# Patient Record
Sex: Male | Born: 1945 | Race: White | Hispanic: No | Marital: Married | Smoking: Never smoker
Health system: Southern US, Community
[De-identification: ages and names within clinical notes are randomized; demographics above are authoritative.]

## PROBLEM LIST (undated history)

## (undated) DIAGNOSIS — F329 Major depressive disorder, single episode, unspecified: Secondary | ICD-10-CM

## (undated) DIAGNOSIS — Z973 Presence of spectacles and contact lenses: Secondary | ICD-10-CM

## (undated) DIAGNOSIS — C801 Malignant (primary) neoplasm, unspecified: Secondary | ICD-10-CM

## (undated) DIAGNOSIS — K5900 Constipation, unspecified: Secondary | ICD-10-CM

## (undated) DIAGNOSIS — F32A Depression, unspecified: Secondary | ICD-10-CM

## (undated) DIAGNOSIS — R000 Tachycardia, unspecified: Secondary | ICD-10-CM

## (undated) DIAGNOSIS — C719 Malignant neoplasm of brain, unspecified: Secondary | ICD-10-CM

## (undated) DIAGNOSIS — R569 Unspecified convulsions: Secondary | ICD-10-CM

## (undated) DIAGNOSIS — R7989 Other specified abnormal findings of blood chemistry: Secondary | ICD-10-CM

## (undated) DIAGNOSIS — D649 Anemia, unspecified: Secondary | ICD-10-CM

## (undated) DIAGNOSIS — N189 Chronic kidney disease, unspecified: Secondary | ICD-10-CM

## (undated) DIAGNOSIS — I10 Essential (primary) hypertension: Secondary | ICD-10-CM

## (undated) DIAGNOSIS — M1990 Unspecified osteoarthritis, unspecified site: Secondary | ICD-10-CM

## (undated) DIAGNOSIS — F419 Anxiety disorder, unspecified: Secondary | ICD-10-CM

---
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
## 2013-06-13 ENCOUNTER — Emergency Department (HOSPITAL_BASED_OUTPATIENT_CLINIC_OR_DEPARTMENT_OTHER): Payer: Medicare HMO

## 2013-06-13 ENCOUNTER — Emergency Department (HOSPITAL_BASED_OUTPATIENT_CLINIC_OR_DEPARTMENT_OTHER)
Admission: EM | Admit: 2013-06-13 | Discharge: 2013-06-13 | Disposition: A | Discharge status: Home / Self Care | Payer: Medicare HMO | Attending: Emergency Medicine | Admitting: Emergency Medicine

## 2013-06-13 ENCOUNTER — Encounter (HOSPITAL_BASED_OUTPATIENT_CLINIC_OR_DEPARTMENT_OTHER): Payer: Self-pay | Admitting: Emergency Medicine

## 2013-06-13 DIAGNOSIS — Y929 Unspecified place or not applicable: Secondary | ICD-10-CM | POA: Insufficient documentation

## 2013-06-13 DIAGNOSIS — S91309A Unspecified open wound, unspecified foot, initial encounter: Secondary | ICD-10-CM | POA: Insufficient documentation

## 2013-06-13 DIAGNOSIS — Z23 Encounter for immunization: Secondary | ICD-10-CM | POA: Insufficient documentation

## 2013-06-13 DIAGNOSIS — W268XXA Contact with other sharp object(s), not elsewhere classified, initial encounter: Secondary | ICD-10-CM | POA: Insufficient documentation

## 2013-06-13 DIAGNOSIS — Y9389 Activity, other specified: Secondary | ICD-10-CM | POA: Insufficient documentation

## 2013-06-13 DIAGNOSIS — T148XXA Other injury of unspecified body region, initial encounter: Secondary | ICD-10-CM

## 2013-06-13 MED ORDER — TETANUS-DIPHTH-ACELL PERTUSSIS 5-2.5-18.5 LF-MCG/0.5 IM SUSP
0.5000 mL | Freq: Once | INTRAMUSCULAR | Status: AC
  Administered 2013-06-13: 0.5 mL via INTRAMUSCULAR
  Filled 2013-06-13: qty 0.5

## 2013-06-13 MED ORDER — CIPROFLOXACIN HCL 500 MG PO TABS
500.0000 mg | ORAL_TABLET | Freq: Once | ORAL | Status: AC
  Administered 2013-06-13: 500 mg via ORAL
  Filled 2013-06-13: qty 1

## 2013-06-13 MED ORDER — CIPROFLOXACIN HCL 500 MG PO TABS: 500.0000 mg | ORAL_TABLET | Freq: Two times a day (BID) | ORAL | Status: DC

## 2013-06-13 NOTE — Discharge Instructions (Signed)
Recheck with any signs of infection: Redness and swelling to the top of the foot, or red streaks to the bottom of the foot.  Puncture Wound A puncture wound happens when a sharp object pokes a hole in the skin. A puncture wound can cause an infection because germs can go under the skin during the injury. HOME CARE   Change your bandage (dressing) once a day, or as told by your doctor. If the bandage sticks, soak it in water.  Keep all doctor visits as told.  Only take medicine as told by your doctor.  Take your medicine (antibiotics) as told. Finish them even if you start to feel better. You may need a tetanus shot if:  You cannot remember when you had your last tetanus shot.  You have never had a tetanus shot. If you need a tetanus shot and you choose not to have one, you may get tetanus. Sickness from tetanus can be serious. You may need a rabies shot if an animal bite caused your wound. GET HELP RIGHT AWAY IF:   Your wound is red, puffy (swollen), or painful.  You see red lines on the skin near the wound.  You have a bad smell coming from the wound or bandage.  You have yellowish-white fluid (pus) coming from the wound.  Your medicine is not working.  You notice an object in the wound.  You have a fever.  You have severe pain.  You have trouble breathing.  You feel dizzy or pass out (faint).  You keep throwing up (vomiting).  You lose feeling (numbness) in your arm or leg, or you cannot move your arm or leg.  Your problems get worse. MAKE SURE YOU:   Understand these instructions.  Will watch your condition.  Will get help right away if you are not doing well or get worse. Document Released: 11/22/2007 Document Revised: 05/07/2011 Document Reviewed: 08/01/2010 [facility] Patient Information 2014 [place], [place].

## 2013-06-13 NOTE — ED Provider Notes (Signed)
CSN: [id number]     Arrival date & time 06/13/13  1504 History  This chart was scribed for Tanna Furry, MD by Elby Beck, ED Scribe. This patient was seen in room [facility]2/[facility] and the patient's care was started at 4:54 PM.   Chief Complaint  Patient presents with  . Puncture Wound    The history is provided by the patient. No language interpreter was used.    HPI Comments: Phillip [name] is a 68 y.o. male who presents to the Emergency Department complaining of 2 small puncture wounds to the plantar surface of his left foot. He states that he accidentally stepped on a board with nails in it while while wearing Crocs, and no socks. He reports having constant, mild pain to the areas of injury. He reports having a small amount of bleeding initially which he states has resolved. He states that he cleaned the puncture wounds with alcohol, but he states that he not tried any other treatments or medications. He states that he is otherwise healthy. He denies any history of DM. He states that he has no medication allergies.   History reviewed. No pertinent past medical history. History reviewed. No pertinent past surgical history. No family history on file. History  Substance Use Topics  . Smoking status: Never Smoker   . Smokeless tobacco: Never Used  . Alcohol Use: Not on file    Review of Systems  Constitutional: Negative for fever, chills, diaphoresis, appetite change and fatigue.  HENT: Negative for mouth sores, sore throat and trouble swallowing.   Eyes: Negative for visual disturbance.  Respiratory: Negative for cough, chest tightness, shortness of breath and wheezing.   Cardiovascular: Negative for chest pain.  Gastrointestinal: Negative for nausea, vomiting, abdominal pain, diarrhea and abdominal distention.  Endocrine: Negative for polydipsia, polyphagia and polyuria.  Genitourinary: Negative for dysuria, frequency and hematuria.  Musculoskeletal: Negative for gait problem.  Skin:  Positive for wound. Negative for color change, pallor and rash.  Neurological: Negative for dizziness, syncope, light-headedness and headaches.  Hematological: Does not bruise/bleed easily.  Psychiatric/Behavioral: Negative for behavioral problems and confusion.    Allergies  Review of patient's allergies indicates no known allergies.  Home Medications   Prior to Admission medications   Not on File   Triage Vitals: BP 171/80  Pulse 57  Temp(Src) 97.9 F (36.6 C) (Oral)  Resp 24  Ht 5\' 11"  (1.803 m)  Wt 250 lb (113.399 kg)  BMI 34.88 kg/m2  SpO2 97%  Physical Exam  Nursing note and vitals reviewed. Constitutional: He is oriented to person, place, and time. He appears well-developed and well-nourished. No distress.  HENT:  Head: Normocephalic.  Eyes: Conjunctivae are normal. Pupils are equal, round, and reactive to light. No scleral icterus.  Neck: Normal range of motion. Neck supple. No thyromegaly present.  Cardiovascular: Normal rate and regular rhythm.   Pulmonary/Chest: Effort normal. No respiratory distress.  Abdominal: Soft. He exhibits no distension.  Musculoskeletal: Normal range of motion.  Neurological: He is alert and oriented to person, place, and time.  Skin: Skin is warm and dry. No rash noted.  2 puncture wounds to the plantar surface of the left foot. One puncture is just proximal to the plantar aspect of the left great toe MCP. The second puncture is 3 cm, and is proximal to the left second toe MCP. No swelling to the dorsum of the foot. Non-tender to the dorsum.  Psychiatric: He has a normal mood and affect. His behavior is normal.  ED Course  Procedures (including critical care time)  DIAGNOSTIC STUDIES: Oxygen Saturation is 97% on RA, normal by my interpretation.    COORDINATION OF CARE: 4:58 PM- Discussed radiology findings. Pt's Tetanus vaccination was updated in the ED. Will start on first dose of Cipro in the ED and will discharge with a  prescription for the same. Pt advised of plan for treatment and pt agrees.  Medications  ciprofloxacin (CIPRO) tablet 500 mg (not administered)  Tdap (BOOSTRIX) injection 0.5 mL (0.5 mLs Intramuscular Given 06/13/13 1556)   Labs Review Labs Reviewed - No data to display  Imaging Review Dg Foot Complete Left  06/13/2013   CLINICAL DATA:  Nail injury to foot.  EXAM: LEFT FOOT - COMPLETE 3+ VIEW  COMPARISON:  None.  FINDINGS: Scattered degenerative changes. Tiny radiopaque foreign object measures maximally 5 mm adjacent the fifth MTP joint laterally. No acute fracture or dislocation. No soft tissue gas. Vascular calcifications.  IMPRESSION: Tiny radiopaque foreign object lateral to the fifth MTP joint.   Electronically Signed   By: Abigail Miyamoto M.D.   On: 06/13/2013 16:14     EKG Interpretation None      MDM   Final diagnoses:  Puncture wound    Several punctures. He has a metallic foreign body in the soft tissues laterally adjacent his fifth digit. This is not from today's injury. He is no sign of puncture anywhere this proximity. He was wearing "Crocks" shoes without socks. Plan will be antibiotic prophylaxis with pseudomonal coverage. Given prescription for Cipro. Recheck any sign of infection. No sign of fracture or soft tissue gas on x-rays.  I personally performed the services described in this documentation, which was scribed in my presence. The recorded information has been reviewed and is accurate.   Tanna Furry, MD 06/13/13 [phone]

## 2013-06-13 NOTE — ED Notes (Signed)
Pt reports stepped on nail with left foot- went through shoe into ball of foot- nail no longer in foot

## 2015-09-28 ENCOUNTER — Ambulatory Visit
Admission: RE | Admit: 2015-09-28 | Discharge: 2015-09-28 | Disposition: A | Discharge status: Home / Self Care | Payer: Medicare HMO | Source: Ambulatory Visit | Attending: Internal Medicine | Admitting: Internal Medicine

## 2015-09-28 ENCOUNTER — Other Ambulatory Visit: Payer: Self-pay | Admitting: Internal Medicine

## 2015-09-28 DIAGNOSIS — M5416 Radiculopathy, lumbar region: Secondary | ICD-10-CM

## 2015-10-26 ENCOUNTER — Other Ambulatory Visit: Payer: Self-pay | Admitting: Internal Medicine

## 2015-10-26 DIAGNOSIS — M545 Low back pain: Secondary | ICD-10-CM

## 2015-11-05 ENCOUNTER — Ambulatory Visit
Admission: RE | Admit: 2015-11-05 | Discharge: 2015-11-05 | Disposition: A | Discharge status: Home / Self Care | Payer: Medicare HMO | Source: Ambulatory Visit | Attending: Internal Medicine | Admitting: Internal Medicine

## 2015-11-05 DIAGNOSIS — M545 Low back pain: Secondary | ICD-10-CM

## 2015-11-09 ENCOUNTER — Encounter: Payer: Self-pay | Admitting: Radiation Oncology

## 2015-11-09 DIAGNOSIS — C7949 Secondary malignant neoplasm of other parts of nervous system: Secondary | ICD-10-CM | POA: Insufficient documentation

## 2015-11-10 ENCOUNTER — Telehealth: Payer: Self-pay | Admitting: Oncology

## 2015-11-10 ENCOUNTER — Encounter: Payer: Self-pay | Admitting: Oncology

## 2015-11-10 NOTE — Telephone Encounter (Signed)
Appointment scheduled with Dr. [name] for 9/21 at 2pm. Patent aware to arrive 15 minutes early. Demographics has been verfied. Letter mailed to the patient.

## 2015-11-14 ENCOUNTER — Ambulatory Visit
Admission: RE | Admit: 2015-11-14 | Discharge: 2015-11-14 | Disposition: A | Discharge status: Home / Self Care | Payer: Commercial Managed Care - HMO | Source: Ambulatory Visit | Attending: Radiation Oncology | Admitting: Radiation Oncology

## 2015-11-14 ENCOUNTER — Encounter: Payer: Self-pay | Admitting: Radiation Oncology

## 2015-11-14 ENCOUNTER — Other Ambulatory Visit (HOSPITAL_COMMUNITY): Payer: Self-pay | Admitting: Neurosurgery

## 2015-11-14 ENCOUNTER — Telehealth (HOSPITAL_COMMUNITY): Payer: Self-pay | Admitting: Radiology

## 2015-11-14 VITALS — BP 157/63 | Temp 98.5°F | Resp 18 | Ht 71.0 in | Wt 257.8 lb

## 2015-11-14 DIAGNOSIS — C7951 Secondary malignant neoplasm of bone: Secondary | ICD-10-CM

## 2015-11-14 DIAGNOSIS — Z510 Encounter for antineoplastic radiation therapy: Secondary | ICD-10-CM | POA: Insufficient documentation

## 2015-11-14 DIAGNOSIS — I10 Essential (primary) hypertension: Secondary | ICD-10-CM | POA: Diagnosis not present

## 2015-11-14 DIAGNOSIS — D492 Neoplasm of unspecified behavior of bone, soft tissue, and skin: Secondary | ICD-10-CM

## 2015-11-14 HISTORY — DX: Essential (primary) hypertension: I10

## 2015-11-14 LAB — CBC WITH DIFFERENTIAL/PLATELET
BASO%: 0.5 % (ref 0.0–2.0)
Basophils Absolute: 0.1 10*3/uL (ref 0.0–0.1)
EOS%: 5.5 % (ref 0.0–7.0)
Eosinophils Absolute: 0.6 10*3/uL — ABNORMAL HIGH (ref 0.0–0.5)
HCT: 43.7 % (ref 38.4–49.9)
HGB: 14.5 g/dL (ref 13.0–17.1)
LYMPH%: 28 % (ref 14.0–49.0)
MCH: 28.6 pg (ref 27.2–33.4)
MCHC: 33.2 g/dL (ref 32.0–36.0)
MCV: 86 fL (ref 79.3–98.0)
MONO#: 1.1 10*3/uL — ABNORMAL HIGH (ref 0.1–0.9)
MONO%: 10.4 % (ref 0.0–14.0)
NEUT#: 5.8 10*3/uL (ref 1.5–6.5)
NEUT%: 55.6 % (ref 39.0–75.0)
Platelets: 262 10*3/uL (ref 140–400)
RBC: 5.09 10*6/uL (ref 4.20–5.82)
RDW: 13.5 % (ref 11.0–14.6)
WBC: 10.5 10*3/uL — ABNORMAL HIGH (ref 4.0–10.3)
lymph#: 2.9 10*3/uL (ref 0.9–3.3)

## 2015-11-14 LAB — COMPREHENSIVE METABOLIC PANEL
ALT: 48 U/L (ref 0–55)
AST: 20 U/L (ref 5–34)
Albumin: 4.3 g/dL (ref 3.5–5.0)
Alkaline Phosphatase: 87 U/L (ref 40–150)
Anion Gap: 11 mEq/L (ref 3–11)
BUN: 15 mg/dL (ref 7.0–26.0)
CO2: 27 mEq/L (ref 22–29)
Calcium: 10.4 mg/dL (ref 8.4–10.4)
Chloride: 102 mEq/L (ref 98–109)
Creatinine: 0.9 mg/dL (ref 0.7–1.3)
EGFR: 87 mL/min/{1.73_m2} — ABNORMAL LOW (ref >90)
Glucose: 118 mg/dl (ref 70–140)
Potassium: 4.2 mEq/L (ref 3.5–5.1)
Sodium: 140 mEq/L (ref 136–145)
Total Bilirubin: 0.46 mg/dL (ref 0.20–1.20)
Total Protein: 8.2 g/dL (ref 6.4–8.3)

## 2015-11-14 NOTE — Progress Notes (Signed)
[facility]         [id number] ________________________________  Initial outpatient Consultation  Name: Phillip [name] MRN: [id number]  Date: 11/14/2015  DOB: [birth year]  REFERRING PHYSICIAN: [name], MD  DIAGNOSIS: 70 yo man with a 6 cm expansile mass of the right L4 vertebral body suspicious for malignancy    ICD-9-CM ICD-10-CM   1. 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 male who was referred from Dr. Brayton Layman to Dr. Vertell Limber for evaluation of right hip and buttock pain. He participated in PT with no relief. An MRI revealed a right sided L4 malignant bone lesion which is causing compression of the L4 nerve root, multilevel bulging discs, and degeneration. He notes the right hip and buttock pain with a pain range from 3-7. He notes the pain in his right hip is a 5/10. He is taking Hydrocodone and Tylenol. Patient notes he is taking laxatives as needed for constipation due to Hydrocodone. He denies weakness or numbness in extremities. The patient is ambulatory without assistance.  [name]  PREVIOUS RADIATION THERAPY: No  Past Medical History:  Diagnosis Date  . Hypertension   :  No past surgical history on file.:   Current Outpatient Prescriptions:  .  acetaminophen (TYLENOL) 650 MG CR tablet, Take 650 mg by mouth every 8 (eight) hours as needed for pain., Disp: , Rfl:  .  diclofenac (VOLTAREN) 75 MG EC tablet, , Disp: , Rfl:  .  famotidine (PEPCID) 20 MG tablet, Take 20 mg by mouth 2 (two) times daily., Disp: , Rfl:  .  losartan-hydrochlorothiazide (HYZAAR) 100-12.5 MG tablet, , Disp: , Rfl:  .  Multiple Vitamins-Minerals (MULTIVITAMIN WITH MINERALS) tablet, Take 1 tablet by mouth daily., Disp: , Rfl:  .  oxyCODONE (OXY IR/ROXICODONE) 5 MG immediate release tablet, , Disp: , Rfl: :  No Known Allergies:  No family history on file.:  Social History   Social History  . Marital status: Single    Spouse  name: N/A  . Number of children: N/A  . Years of education: N/A   Occupational History  . Not on file.   Social History Main Topics  . Smoking status: Never Smoker  . Smokeless tobacco: Never Used  . Alcohol use Not on file  . Drug use:     Types: Marijuana  . Sexual activity: Not on file   Other Topics Concern  . Not on file   Social History Narrative  . No narrative on file  :  REVIEW OF SYSTEMS:  A 15 point review of systems is documented in the electronic medical record. This was obtained by the nursing staff. However, I reviewed this with the patient to discuss relevant findings and make appropriate changes.  Pertinent items are noted in HPI.   PHYSICAL EXAM:  Blood pressure (!) 157/63, temperature 98.5 F (36.9 C), temperature source Oral, resp. rate 18, height '5\' 11"'$  (1.803 m), weight 257 lb 12.8 oz (116.9 kg), SpO2 100 %. General appearance: alert, cooperative and appears stated age Neck: no adenopathy Resp: clear to auscultation bilaterally Extremities: extremities normal, atraumatic, no cyanosis or edema Neurologic: Alert and oriented X 3, normal strength and tone. Normal symmetric reflexes. Normal coordination and gait   KPS = 90  100 - Normal; no complaints; no evidence of disease. 90   - Able to carry on normal activity; minor signs or symptoms of disease. 80   - Normal  activity with effort; some signs or symptoms of disease. 102   - Cares for self; unable to carry on normal activity or to do active work. 60   - Requires occasional assistance, but is able to care for most of his personal needs. 50   - Requires considerable assistance and frequent medical care. 75   - Disabled; requires special care and assistance. 44   - Severely disabled; hospital admission is indicated although death not imminent. 78   - Very sick; hospital admission necessary; active supportive treatment necessary. 10   - Moribund; fatal processes progressing rapidly. 0     -  Dead  Karnofsky DA, Abelmann WH, Craver LS and Burchenal JH [phone]) The use of the nitrogen mustards in the palliative treatment of carcinoma: with particular reference to bronchogenic carcinoma Cancer 1 634-56  LABORATORY DATA:  No results found for: WBC, HGB, HCT, MCV, PLT No results found for: NA, K, CL, CO2 No results found for: ALT, AST, GGT, ALKPHOS, BILITOT   RADIOGRAPHY: Mr Lumbar Spine Wo Contrast  Result Date: 11/05/2015 CLINICAL DATA:  Low back pain without sciatica. Weakness in the right foot starting 4 months ago. EXAM: MRI LUMBAR SPINE WITHOUT CONTRAST TECHNIQUE: Multiplanar, multisequence MR imaging of the lumbar spine was performed. No intravenous contrast was administered. COMPARISON:  None. FINDINGS: Segmentation:  Standard. Alignment:  Physiologic. Vertebrae: Large bone lesion in the L4 right posterior body, pedicle, lamina, transverse process, and articular processes. There is extraosseous extension in the right far-lateral space with possible psoas and intrinsic back muscle invasion. Infiltration in the right L4-5 more than L3-4 foramina with right L4 compression. Epidural infiltration along the right canal with advanced thecal sac mass effect. When accounting for T11 and L3, and L4 hemangiomas no other bone lesion is seen Conus medullaris: Extends to the T12 level and appears normal. Paraspinal and other soft tissues: Right far-lateral/ paraspinal tumor as above. Disc levels: Spinal canal stenosis is accentuated by short pedicles L2-L3: Mild disc bulging. Triangular narrowing of the thecal sac without suspected impingement L3-L4: Tumoral stenosis in the right canal as described above. Mild facet hypertrophy and disc bulging. Moderate canal stenosis. L4-L5: Advanced canal stenosis from epidural tumor. There is a background of disc bulging and congenitally narrow canal. The right L4 nerve is completely encased/ compressed by tumor. L5-S1:Narrowing and bulging with central protrusion.  Hyper trophic facet arthropathy. Moderate right foraminal stenosis. These results will be called to the ordering clinician or representative by the Radiologist Assistant, and communication documented in the PACS or zVision Dashboard. IMPRESSION: 1. 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 There is extraosseous epidural and right foraminal infiltration with advanced canal and foraminal stenosis. Metastasis or myeloma is favored. Recommend postcontrast imaging. 2. 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 Superimposed degenerative changes are mild for age. Electronically Signed   By: Monte Fantasia M.D.   On: 11/05/2015 20:02      IMPRESSION: 70 yo man with a 6 cm expansile mass of the right L4 vertebral body suspicious for malignancy  PLAN:Today, I talked to the patient and family about the findings and work-up thus far.  We discussed the natural history of spinal metastasis versus myeloma and other diseases and general treatment, highlighting the role of radiotherapy in the management.  Prior to making any further recommendations, the patient understands that he needs additional diagnostic work-up.  He meets with Dr. [name] later this week.  Today, I took the liberty of ordering:  1.  CBC with diff, C-Met 2.  CT Chest/Abd/Pelvis 3.  CT-guided biopsy of L4  I spent time face to face with the patient and more than 50% of that time was spent in counseling and/or coordination of care.    ------------------------------------------------   Tyler Pita, MD [facility] Director and Director of Stereotactic Radiosurgery Direct Dial: [phone]  Fax: [phone] [email or web address]  Skype  [email or web address]    This document serves as a record of services personally performed by Tyler Pita, MD. It was created on his behalf by Bethann Humble, a trained medical scribe. The creation of this record is based on the scribe's personal  observations and the provider's statements to them. This document has been checked and approved by the attending provider.

## 2015-11-14 NOTE — Addendum Note (Signed)
Encounter addended by: Tyler Pita, MD on: 11/14/2015  1:50 PM<BR>    Actions taken: LOS modified, Follow-up modified

## 2015-11-14 NOTE — Progress Notes (Signed)
Histology and Location of Primary Cancer: unknown  Sites of Visceral and Bony Metastatic Disease: unknown  Location(s) of Symptomatic Metastases: L4   Past/Anticipated chemotherapy by medical oncology, if any: No  Pain on a scale of 0-10 is: right hip and buttock pain 3-7 on a scale of 0-10. Pain to right hip 5/10 taking Hydrocodone and Tylenol   If Spine Met(s), symptoms, if any, include:  Bowel/Bladder retention or incontinence (please describe): Taking laxative as needed for constipation due to Hydrocodone  Numbness or weakness in extremities (please describe): no  Current Decadron regimen, if applicable: no. Prednisone taper not helpful  Ambulatory status? Walker? Wheelchair?: Ambulatory without assistance  SAFETY ISSUES:  Prior radiation? no  Pacemaker/ICD? No  Possible current pregnancy? no  Is the patient on methotrexate? no  Current Complaints / other details:  70 year old male. Patient referred from Dr. Brayton Layman to Dr. Vertell Limber for evaluation of right hip and buttock pain. Participated in PT with no relief. MRI revealed a right sided L4 malignant bone lesion which is causing compression of the L4 nerve root, multilevel bulging discs, and degeneration. Wt Readings from Last 3 Encounters:  11/14/15 257 lb 12.8 oz (116.9 kg)  06/13/13 250 lb (113.4 kg)  BP (!) 157/63 (BP Location: Right Arm, Patient Position: Sitting, Cuff Size: Large)   Temp 98.5 F (36.9 C) (Oral)   Resp 18   Ht '5\' 11"'$  (1.803 m)   Wt 257 lb 12.8 oz (116.9 kg)   SpO2 100%   BMI 35.96 kg/m

## 2015-11-14 NOTE — Telephone Encounter (Signed)
Called pt, spoke to his partner, Phillip [name]. She states that they had an appt with Dr. Tammi Klippel today and she wanted to call his PA back before scheduling the biopsy with Deveshwar. She said she would call me back. JM

## 2015-11-15 ENCOUNTER — Telehealth: Payer: Self-pay | Admitting: Radiation Oncology

## 2015-11-15 NOTE — Telephone Encounter (Signed)
Received message from Evelina Bucy requesting a return call. Ms. Cristela Blue explains that Stern's office has already called to arrange biopsy but, she thought the CT needed to happen first. Per Shona Simpson, PA-C explained "timing doesn't matter really" but both CT and biopsy are needed. Romie Jumper committed to scheduling CT asap. Ms. Cristela Blue understands Enid Derry will be contacting her shortly.

## 2015-11-16 ENCOUNTER — Other Ambulatory Visit: Payer: Self-pay | Admitting: Radiology

## 2015-11-17 ENCOUNTER — Ambulatory Visit (HOSPITAL_BASED_OUTPATIENT_CLINIC_OR_DEPARTMENT_OTHER): Payer: Commercial Managed Care - HMO | Admitting: Oncology

## 2015-11-17 ENCOUNTER — Other Ambulatory Visit: Payer: Self-pay | Admitting: *Deleted

## 2015-11-17 ENCOUNTER — Other Ambulatory Visit: Payer: Self-pay | Admitting: Physician Assistant

## 2015-11-17 VITALS — BP 131/67 | HR 62 | Temp 98.7°F | Resp 17 | Ht 71.0 in | Wt 257.3 lb

## 2015-11-17 DIAGNOSIS — M899 Disorder of bone, unspecified: Secondary | ICD-10-CM | POA: Diagnosis not present

## 2015-11-17 DIAGNOSIS — I10 Essential (primary) hypertension: Secondary | ICD-10-CM | POA: Diagnosis not present

## 2015-11-17 DIAGNOSIS — D492 Neoplasm of unspecified behavior of bone, soft tissue, and skin: Secondary | ICD-10-CM

## 2015-11-17 MED ORDER — HYDROMORPHONE HCL 4 MG PO TABS: 4.0000 mg | ORAL_TABLET | ORAL | 0 refills | Status: DC | PRN

## 2015-11-17 NOTE — Progress Notes (Signed)
[facility] Patient Consult   Referring MD: [name], Md [address], [place] [address]   Phillip [name] 70 y.o.  12-28-1945    Reason for Referral: Lumbar mass   HPI: Phillip [name] developed lower back pain beginning in April of this year. The pain became more severe over the summer and he canceled a planned trip to [place]. He saw Dr. Inda Merlin and reports there was no improvement with prednisone or physical therapy. He now takes oxycodone for pain. The pain is partially relieved with oxycodone, but is worse at night. Dr. Inda Merlin obtain an MRI of the lumbar spine on 11/05/2015. A lesion was noted in the posterior L4 body with extraosseous extension in the right lateral space with possible muscle invasion. Infiltration along the right lateral canal with thecal sac mass effect was noted. No other bone lesions. The right L4 nerve is encased by tumor.  He was referred to Dr. Vertell Limber. He is scheduled for a CT-guided biopsy of the L4 mass on 11/18/2015. He is scheduled for staging CT scans on 11/21/2015.   Past Medical History:  Diagnosis Date  . Hypertension    . Past surgical history: None  Medications: Reviewed  Allergies: No Known Allergies  Family history: A grandson had non-Hodgkin's lymphoma. No other family history of cancer.  Social History:   He lives in [place]. 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 He has been exposed to solvents while working on [employer]. He smokes marijuana. No alcohol use.     ROS:   Positives include: Pain in the right upper but not-worse with walking and lying down. Constipation when taking pain medication. Anorexia for the past 2 weeks.  A complete ROS was otherwise negative.  Physical Exam:  Blood pressure 131/67, pulse 62, temperature 98.7 F (37.1 C), temperature source Oral, resp. rate 17, height 5\' 11"  (1.803 m), weight 257 lb 4.8 oz (116.7 kg), SpO2 98 %.  HEENT: Multiple missing teeth,  only one remaining tooth. Oropharynx without visible mass, neck without mass Lungs: Clear bilaterally Cardiac: Regular rate and rhythm Abdomen: No hepatosplenomegaly, no mass, no apparent ascites GU: Bilateral testicular enlargement without a discrete mass, uncircumcised  Vascular: No leg edema Lymph nodes: No cervical, supraclavicular, axillary, or inguinal nodes Neurologic: Alert and oriented, the motor exam appears intact in the upper and lower extremities Skin: No rash Musculoskeletal: No spine tenderness   LAB:  CBC  Lab Results  Component Value Date   WBC 10.5 (H) 11/14/2015   HGB 14.5 11/14/2015   HCT 43.7 11/14/2015   MCV 86.0 11/14/2015   PLT 262 11/14/2015   NEUTROABS 5.8 11/14/2015     CMP      Component Value Date/Time   NA 140 11/14/2015 1223   K 4.2 11/14/2015 1223   CO2 27 11/14/2015 1223   GLUCOSE 118 11/14/2015 1223   BUN 15.0 11/14/2015 1223   CREATININE 0.9 11/14/2015 1223   CALCIUM 10.4 11/14/2015 1223   PROT 8.2 11/14/2015 1223   ALBUMIN 4.3 11/14/2015 1223   AST 20 11/14/2015 1223   ALT 48 11/14/2015 1223   ALKPHOS 87 11/14/2015 1223   BILITOT 0.46 11/14/2015 1223    Imaging:  MRI lumbar spine 11/05/2015-images reviewed   Assessment/Plan:   1. L4 mass with extraosseous extension, L4 nerve compression  2. Pain secondary to #1  3.   Hypertension   Disposition:   Phillip [name] presents with pain in the lower back and right  buttock related to a mass involving L4 with extraosseous extension.  I discussed the differential diagnosis with Phillip [name]. He may have metastatic disease from a primary tumor site or a hematopoietic malignancy. No apparent source for a primary tumor on review of his physical exam and laboratory studies today.  He is scheduled for a biopsy of L4 tomorrow. We arranged for the staging CT scans to be obtained prior to the biopsy procedure. He will return for an office visit on 11/22/2015.  He was given a  prescription for Dilaudid to use for pain not relieved with oxycodone.  Approximately 50 minutes were spent with the patient today. The majority of the time was used for counseling and coordination of care.  Betsy Coder, MD  11/17/2015, 2:38 PM

## 2015-11-18 ENCOUNTER — Encounter (HOSPITAL_COMMUNITY): Payer: Self-pay

## 2015-11-18 ENCOUNTER — Ambulatory Visit (HOSPITAL_COMMUNITY)
Admission: RE | Admit: 2015-11-18 | Discharge: 2015-11-18 | Disposition: A | Discharge status: Home / Self Care | Payer: Commercial Managed Care - HMO | Source: Ambulatory Visit | Attending: Neurosurgery | Admitting: Neurosurgery

## 2015-11-18 ENCOUNTER — Ambulatory Visit (HOSPITAL_COMMUNITY)
Admission: RE | Admit: 2015-11-18 | Discharge: 2015-11-18 | Disposition: A | Discharge status: Home / Self Care | Payer: Commercial Managed Care - HMO | Source: Ambulatory Visit | Attending: Radiation Oncology | Admitting: Radiation Oncology

## 2015-11-18 DIAGNOSIS — I700 Atherosclerosis of aorta: Secondary | ICD-10-CM | POA: Diagnosis not present

## 2015-11-18 DIAGNOSIS — R911 Solitary pulmonary nodule: Secondary | ICD-10-CM | POA: Insufficient documentation

## 2015-11-18 DIAGNOSIS — C7951 Secondary malignant neoplasm of bone: Secondary | ICD-10-CM | POA: Diagnosis not present

## 2015-11-18 DIAGNOSIS — N2889 Other specified disorders of kidney and ureter: Secondary | ICD-10-CM | POA: Insufficient documentation

## 2015-11-18 DIAGNOSIS — M549 Dorsalgia, unspecified: Secondary | ICD-10-CM | POA: Diagnosis not present

## 2015-11-18 DIAGNOSIS — I10 Essential (primary) hypertension: Secondary | ICD-10-CM | POA: Insufficient documentation

## 2015-11-18 DIAGNOSIS — R269 Unspecified abnormalities of gait and mobility: Secondary | ICD-10-CM | POA: Insufficient documentation

## 2015-11-18 DIAGNOSIS — C799 Secondary malignant neoplasm of unspecified site: Secondary | ICD-10-CM | POA: Diagnosis present

## 2015-11-18 DIAGNOSIS — Z79899 Other long term (current) drug therapy: Secondary | ICD-10-CM | POA: Insufficient documentation

## 2015-11-18 DIAGNOSIS — R531 Weakness: Secondary | ICD-10-CM | POA: Diagnosis not present

## 2015-11-18 DIAGNOSIS — C642 Malignant neoplasm of left kidney, except renal pelvis: Secondary | ICD-10-CM | POA: Insufficient documentation

## 2015-11-18 HISTORY — PX: IR GENERIC HISTORICAL: IMG1180011

## 2015-11-18 LAB — CBC
HCT: 41.1 % (ref 39.0–52.0)
Hemoglobin: 13.5 g/dL (ref 13.0–17.0)
MCH: 29.1 pg (ref 26.0–34.0)
MCHC: 32.8 g/dL (ref 30.0–36.0)
MCV: 88.6 fL (ref 78.0–100.0)
Platelets: 237 10*3/uL (ref 150–400)
RBC: 4.64 MIL/uL (ref 4.22–5.81)
RDW: 13.1 % (ref 11.5–15.5)
WBC: 10.1 10*3/uL (ref 4.0–10.5)

## 2015-11-18 LAB — APTT: aPTT: 32 seconds (ref 24–36)

## 2015-11-18 LAB — PROTIME-INR
INR: 1
Prothrombin Time: 13.2 seconds (ref 11.4–15.2)

## 2015-11-18 MED ORDER — FENTANYL CITRATE (PF) 100 MCG/2ML IJ SOLN
INTRAMUSCULAR | Status: AC | PRN
  Administered 2015-11-18: 50 ug via INTRAVENOUS

## 2015-11-18 MED ORDER — FENTANYL CITRATE (PF) 100 MCG/2ML IJ SOLN
INTRAMUSCULAR | Status: AC
  Filled 2015-11-18: qty 2

## 2015-11-18 MED ORDER — SODIUM CHLORIDE 0.9 % IV SOLN: INTRAVENOUS | Status: AC

## 2015-11-18 MED ORDER — MIDAZOLAM HCL 2 MG/2ML IJ SOLN
INTRAMUSCULAR | Status: AC
  Filled 2015-11-18: qty 2

## 2015-11-18 MED ORDER — IOPAMIDOL (ISOVUE-300) INJECTION 61%
100.0000 mL | Freq: Once | INTRAVENOUS | Status: AC | PRN
  Administered 2015-11-18: 100 mL via INTRAVENOUS

## 2015-11-18 MED ORDER — GELATIN ABSORBABLE 12-7 MM EX MISC
CUTANEOUS | Status: AC
  Filled 2015-11-18: qty 1

## 2015-11-18 MED ORDER — HYDROMORPHONE HCL 1 MG/ML IJ SOLN
INTRAMUSCULAR | Status: AC
  Filled 2015-11-18: qty 1

## 2015-11-18 MED ORDER — BUPIVACAINE HCL (PF) 0.5 % IJ SOLN
INTRAMUSCULAR | Status: AC | PRN
  Administered 2015-11-18: 10 mL

## 2015-11-18 MED ORDER — BUPIVACAINE HCL (PF) 0.25 % IJ SOLN
INTRAMUSCULAR | Status: AC
  Filled 2015-11-18: qty 30

## 2015-11-18 MED ORDER — SODIUM CHLORIDE 0.9 % IV SOLN
INTRAVENOUS | Status: DC
Start: 2015-11-18 — End: 2015-11-19

## 2015-11-18 MED ORDER — MIDAZOLAM HCL 2 MG/2ML IJ SOLN
INTRAMUSCULAR | Status: AC | PRN
  Administered 2015-11-18 (×2): 1 mg via INTRAVENOUS

## 2015-11-18 NOTE — Procedures (Signed)
S/P L4 core biopsy via RT transpedicular approach. X 3 passes .Specimens to pathology.

## 2015-11-18 NOTE — Progress Notes (Signed)
Report given to [facility] transferred at this time

## 2015-11-18 NOTE — H&P (Signed)
Chief Complaint: Patient was seen in consultation today for Lumbar 4 lesion biopsy at the request of Stern,Joseph  Referring Physician(s): Dr Harriet Butte  Supervising Physician: [name]  Patient Status: Outpatient  History of Present Illness: Phillip [name] is a 70 y.o. male   Pt has had low back pain since 05/2015 Off and on now worsening Has been to chiropractor; treated with meds snd steroids No help MRI 11/05/15: IMPRESSION: 1. 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 There is extraosseous epidural and right foraminal infiltration with advanced canal and foraminal stenosis. Metastasis or myeloma is favored. Recommend postcontrast imaging. 2. 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 Superimposed degenerative changes are mild for age  CT today: IMPRESSION: 1. Examination is positive for solitary left kidney mass worrisome for renal cell carcinoma. 2. 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 3. Mild increased soft tissue within the retroperitoneum is identified and is nonspecific. No discrete mass or adenopathy is identified. 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 4. Aortic atherosclerosis. 5. Right lower lobe lung nodule has a mean diameter of 8 mm.  Scheduled for L4 lesion biopsy now  Past Medical History:  Diagnosis Date  . Hypertension     History reviewed. No pertinent surgical history.  Allergies: Review of patient's allergies indicates no known allergies.  Medications: Prior to Admission medications   Medication Sig Start Date End Date Taking? Authorizing Provider  acetaminophen (TYLENOL) 650 MG CR tablet Take 650 mg by mouth every 8 (eight) hours as needed for pain.   Yes Historical Provider, MD  diclofenac (VOLTAREN) 75 MG EC tablet Take 75 mg by mouth 2 (two) times  daily.  10/26/15  Yes Historical Provider, MD  famotidine (PEPCID) 20 MG tablet Take 20 mg by mouth 2 (two) times daily.   Yes Historical Provider, MD  losartan-hydrochlorothiazide (HYZAAR) 100-12.5 MG tablet Take 1 tablet by mouth daily.  09/23/15  Yes Historical Provider, MD  Multiple Vitamins-Minerals (MULTIVITAMIN WITH MINERALS) tablet Take 1 tablet by mouth daily.   Yes Historical Provider, MD  oxyCODONE (OXY IR/ROXICODONE) 5 MG immediate release tablet Take 5 mg by mouth every 4 (four) hours as needed for moderate pain.  10/26/15  Yes Historical Provider, MD  HYDROmorphone (DILAUDID) 4 MG tablet Take 1 tablet (4 mg total) by mouth every 4 (four) hours as needed for severe pain. 11/17/15   [name], MD     History reviewed. No pertinent family history.  Social History   Social History  . Marital status: Single    Spouse name: N/A  . Number of children: N/A  . Years of education: N/A   Social History Main Topics  . Smoking status: Never Smoker  . Smokeless tobacco: Never Used  . Alcohol use None  . Drug use:     Types: Marijuana  . Sexual activity: Not Asked   Other Topics Concern  . None   Social History Narrative  . None     Review of Systems: A 12 point ROS discussed and pertinent positives are indicated in the HPI above.  All other systems are negative.  Review of Systems  Constitutional: Positive for activity change. Negative for fever and unexpected weight change.  Respiratory: Negative for shortness of breath.   Gastrointestinal: Negative for abdominal pain.  Musculoskeletal: Positive for back pain and gait problem.  Neurological: Positive for weakness.  Psychiatric/Behavioral: Negative  for behavioral problems and confusion.    Vital Signs: BP (!) 158/65   Pulse (!) 58   Temp 98.6 F (37 C)   Resp 20   Ht 5\' 10"  (1.778 m)   Wt 257 lb (116.6 kg)   SpO2 98%   BMI 36.88 kg/m   Physical Exam  Constitutional: He is oriented to person, place, and  time. He appears well-nourished.  Cardiovascular: Normal rate and regular rhythm.   Pulmonary/Chest: Effort normal and breath sounds normal.  Abdominal: Soft. Bowel sounds are normal.  Musculoskeletal: Normal range of motion.  Neurological: He is alert and oriented to person, place, and time.  Skin: Skin is warm and dry.  Psychiatric: He has a normal mood and affect. His behavior is normal. Judgment and thought content normal.  Nursing note and vitals reviewed.   Mallampati Score:  MD Evaluation Airway: WNL Heart: WNL Abdomen: WNL Chest/ Lungs: WNL ASA  Classification: 2 Mallampati/Airway Score: Two  Imaging: Ct Chest W Contrast  Result Date: 11/18/2015 CLINICAL DATA:  Evaluate for metastatic disease. Suspicious lesion involving L4 vertebra on MRI. EXAM: CT CHEST, ABDOMEN, AND PELVIS WITH CONTRAST TECHNIQUE: Multidetector CT imaging of the chest, abdomen and pelvis was performed following the standard protocol during bolus administration of intravenous contrast. CONTRAST:  160mL ISOVUE-300 IOPAMIDOL (ISOVUE-300) INJECTION 61% COMPARISON:  None. FINDINGS: CT CHEST FINDINGS Cardiovascular: The heart size is normal. No pericardial effusion. Aortic atherosclerosis noted. Mediastinum/Nodes: The trachea appears patent and is midline. Nodule arising from inferior pole left lobe of thyroid gland measures 1.6 cm, image 12 of series 2. Normal appearance of the esophagus. No enlarged mediastinal or hilar lymph nodes. No axillary or supraclavicular adenopathy. Lungs/Pleura: There is no pleural effusion identified. Nodular density within the right lower lobe Measures 11 x 5 mm (mean diameter 8 mm), image 84 of series 6. There is no airspace consolidation or atelectasis identified. Musculoskeletal: The bones are mildly osteopenic. Expansile lytic lesion is identified involving the right eleventh rib at the costovertebral junction, image 55 of series T8. This measures 3.552.7 cm. CT ABDOMEN PELVIS FINDINGS  Hepatobiliary: There is diffuse hepatic steatosis identified. Within the central aspect of the medial segment of left lobe of liver there is a focal area of low attenuation measuring 1.7 cm, image 54 series 2. Ste is identified within the lumen of the gallbladder Pancreas: No inflammation or mass identified. Spleen: The spleen appears normal. Adrenals/Urinary Tract: The adrenal glands are normal. The right kidney is unremarkable. Solid mass within the upper pole the left kidney measures 5.4 x 5.0 cm, image 71 of series 2. There is a small cyst arising from the inferior pole the left kidney which measures 1.1 cm. The left renal vein appears patent. Stomach/Bowel: The stomach is normal. The small bowel loops have a normal course and caliber. No bowel obstruction. Normal appearance of the proximal colon. The distal colon is also unremarkable. Vascular/Lymphatic: Aortic atherosclerosis is identified. There is mild increased soft tissue surrounding the infrarenal abdominal aorta, image 85 of series 2. No discrete mass or no periaortic adenopathy. No pelvic or inguinal adenopathy. Reproductive: Prostate gland enlargement. Right-sided hydrocele is identified. Other: There is no ascites or focal fluid collections within the abdomen or pelvis. Musculoskeletal: Large expansile bone lesion involving the right side at L4 vertebra measures 6.1 x 4.8 cm, image 90 of series 2. There appears to be significant canal involvement, image 90 of series 2. There is a lytic lesion identified within the anterior column of the left  acetabulum measuring 9 mm. IMPRESSION: 1. Examination is positive for solitary left kidney mass worrisome for renal cell carcinoma. 2. 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 3. Mild increased soft tissue within the retroperitoneum is identified and is nonspecific. No discrete mass or adenopathy is identified.  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 4. Aortic atherosclerosis. 5. Right lower lobe lung nodule has a mean diameter of 8 mm. Electronically Signed   By: Kerby Moors M.D.   On: 11/18/2015 10:04   Mr Lumbar Spine Wo Contrast  Result Date: 11/05/2015 CLINICAL DATA:  Low back pain without sciatica. Weakness in the right foot starting 4 months ago. EXAM: MRI LUMBAR SPINE WITHOUT CONTRAST TECHNIQUE: Multiplanar, multisequence MR imaging of the lumbar spine was performed. No intravenous contrast was administered. COMPARISON:  None. FINDINGS: Segmentation:  Standard. Alignment:  Physiologic. Vertebrae: Large bone lesion in the L4 right posterior body, pedicle, lamina, transverse process, and articular processes. There is extraosseous extension in the right far-lateral space with possible psoas and intrinsic back muscle invasion. Infiltration in the right L4-5 more than L3-4 foramina with right L4 compression. Epidural infiltration along the right canal with advanced thecal sac mass effect. When accounting for T11 and L3, and L4 hemangiomas no other bone lesion is seen Conus medullaris: Extends to the T12 level and appears normal. Paraspinal and other soft tissues: Right far-lateral/ paraspinal tumor as above. Disc levels: Spinal canal stenosis is accentuated by short pedicles L2-L3: Mild disc bulging. Triangular narrowing of the thecal sac without suspected impingement L3-L4: Tumoral stenosis in the right canal as described above. Mild facet hypertrophy and disc bulging. Moderate canal stenosis. L4-L5: Advanced canal stenosis from epidural tumor. There is a background of disc bulging and congenitally narrow canal. The right L4 nerve is completely encased/ compressed by tumor. L5-S1:Narrowing and bulging with central protrusion. Hyper trophic facet arthropathy. Moderate right foraminal stenosis. These results will be called to the ordering clinician or representative by the Radiologist Assistant, and  communication documented in the PACS or zVision Dashboard. IMPRESSION: 1. 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 There is extraosseous epidural and right foraminal infiltration with advanced canal and foraminal stenosis. Metastasis or myeloma is favored. Recommend postcontrast imaging. 2. 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 Superimposed degenerative changes are mild for age. Electronically Signed   By: Monte Fantasia M.D.   On: 11/05/2015 20:02   Ct Abdomen Pelvis W Contrast  Result Date: 11/18/2015 CLINICAL DATA:  Evaluate for metastatic disease. Suspicious lesion involving L4 vertebra on MRI. EXAM: CT CHEST, ABDOMEN, AND PELVIS WITH CONTRAST TECHNIQUE: Multidetector CT imaging of the chest, abdomen and pelvis was performed following the standard protocol during bolus administration of intravenous contrast. CONTRAST:  153mL ISOVUE-300 IOPAMIDOL (ISOVUE-300) INJECTION 61% COMPARISON:  None. FINDINGS: CT CHEST FINDINGS Cardiovascular: The heart size is normal. No pericardial effusion. Aortic atherosclerosis noted. Mediastinum/Nodes: The trachea appears patent and is midline. Nodule arising from inferior pole left lobe of thyroid gland measures 1.6 cm, image 12 of series 2. Normal appearance of the esophagus. No enlarged mediastinal or hilar lymph nodes. No axillary or supraclavicular adenopathy. Lungs/Pleura: There is no pleural effusion identified. Nodular density within the right lower lobe Measures 11 x 5 mm (mean diameter 8 mm), image 84 of series 6. There is no airspace consolidation or atelectasis identified. Musculoskeletal: The bones are mildly osteopenic. Expansile lytic lesion is identified involving the right  eleventh rib at the costovertebral junction, image 55 of series T8. This measures 3.552.7 cm. CT ABDOMEN PELVIS FINDINGS Hepatobiliary: There is diffuse hepatic steatosis identified. Within the central aspect of the medial segment of left lobe  of liver there is a focal area of low attenuation measuring 1.7 cm, image 54 series 2. Ste is identified within the lumen of the gallbladder Pancreas: No inflammation or mass identified. Spleen: The spleen appears normal. Adrenals/Urinary Tract: The adrenal glands are normal. The right kidney is unremarkable. Solid mass within the upper pole the left kidney measures 5.4 x 5.0 cm, image 71 of series 2. There is a small cyst arising from the inferior pole the left kidney which measures 1.1 cm. The left renal vein appears patent. Stomach/Bowel: The stomach is normal. The small bowel loops have a normal course and caliber. No bowel obstruction. Normal appearance of the proximal colon. The distal colon is also unremarkable. Vascular/Lymphatic: Aortic atherosclerosis is identified. There is mild increased soft tissue surrounding the infrarenal abdominal aorta, image 85 of series 2. No discrete mass or no periaortic adenopathy. No pelvic or inguinal adenopathy. Reproductive: Prostate gland enlargement. Right-sided hydrocele is identified. Other: There is no ascites or focal fluid collections within the abdomen or pelvis. Musculoskeletal: Large expansile bone lesion involving the right side at L4 vertebra measures 6.1 x 4.8 cm, image 90 of series 2. There appears to be significant canal involvement, image 90 of series 2. There is a lytic lesion identified within the anterior column of the left acetabulum measuring 9 mm. IMPRESSION: 1. Examination is positive for solitary left kidney mass worrisome for renal cell carcinoma. 2. 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 3. Mild increased soft tissue within the retroperitoneum is identified and is nonspecific. No discrete mass or adenopathy is identified. 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 4. Aortic atherosclerosis. 5. Right lower lobe lung nodule has a mean  diameter of 8 mm. Electronically Signed   By: Kerby Moors M.D.   On: 11/18/2015 10:04    Labs:  CBC:  Recent Labs  11/14/15 1223 11/18/15 1337  WBC 10.5* 10.1  HGB 14.5 13.5  HCT 43.7 41.1  PLT 262 237    COAGS: No results for input(s): INR, APTT in the last 8760 hours.  BMP:  Recent Labs  11/14/15 1223  NA 140  K 4.2  CO2 27  GLUCOSE 118  BUN 15.0  CALCIUM 10.4  CREATININE 0.9    LIVER FUNCTION TESTS:  Recent Labs  11/14/15 1223  BILITOT 0.46  AST 20  ALT 48  ALKPHOS 87  PROT 8.2  ALBUMIN 4.3    TUMOR MARKERS: No results for input(s): AFPTM, CEA, CA199, CHROMGRNA in the last 8760 hours.  Assessment and Plan:  Low back pain for months Worsening Lumbar 4 lesion - now for biopsy Risks and Benefits discussed with the patient including, but not limited to bleeding, infection, damage to adjacent structures or low yield requiring additional tests. All of the patient's questions were answered, patient is agreeable to proceed. Consent signed and in chart.   Thank you for this interesting consult.  I greatly enjoyed meeting Phillip [name] and look forward to participating in their care.  A copy of this report was sent to the requesting provider on this date.  Electronically Signed: Monia Sabal A 11/18/2015, 2:02 PM   I spent a total of  30 Minutes   in face to face  in clinical consultation, greater than 50% of which was counseling/coordinating care for L 4 lesion biopsy

## 2015-11-18 NOTE — Progress Notes (Signed)
V-Pad removed and replaced with sterile 2x2 and tegaderm

## 2015-11-18 NOTE — Discharge Instructions (Signed)
Needle Biopsy, Care After °These instructions give you information about caring for yourself after your procedure. Your doctor may also give you more specific instructions. Call your doctor if you have any problems or questions after your procedure. °HOME CARE °· Rest as told by your doctor. °· Take medicines only as told by your doctor. °· There are many different ways to close and cover the biopsy site, including stitches (sutures), skin glue, and adhesive strips. Follow instructions from your doctor about: °¨ How to take care of your biopsy site. °¨ When and how you should change your bandage (dressing). °¨ When you should remove your dressing. °¨ Removing whatever was used to close your biopsy site. °· Check your biopsy site every day for signs of infection. Watch for: °¨ Redness, swelling, or pain. °¨ Fluid, blood, or pus. °GET HELP IF: °· You have a fever. °· You have redness, swelling, or pain at the biopsy site, and it lasts longer than a few days. °· You have fluid, blood, or pus coming from the biopsy site. °· You feel sick to your stomach (nauseous). °· You throw up (vomit). °GET HELP RIGHT AWAY IF: °· You are short of breath. °· You have trouble breathing. °· Your chest hurts. °· You feel dizzy or you pass out (faint). °· You have bleeding that does not stop with pressure or a bandage. °· You cough up blood. °· Your belly (abdomen) hurts. °  °This information is not intended to replace advice given to you by your health care provider. Make sure you discuss any questions you have with your health care provider. °  °Document Released: 01/26/2008 Document Revised: 06/29/2014 Document Reviewed: 02/08/2014 °Elsevier Interactive Patient Education ©2016 [employer] ° °

## 2015-11-18 NOTE — Sedation Documentation (Signed)
Assumed care of pt. Resting quietly.

## 2015-11-18 NOTE — Sedation Documentation (Signed)
Patient is resting comfortably. 

## 2015-11-21 ENCOUNTER — Ambulatory Visit (HOSPITAL_COMMUNITY): Payer: Commercial Managed Care - HMO

## 2015-11-22 ENCOUNTER — Encounter: Payer: Self-pay | Admitting: Radiation Therapy

## 2015-11-22 ENCOUNTER — Ambulatory Visit
Admission: RE | Admit: 2015-11-22 | Discharge: 2015-11-22 | Disposition: A | Discharge status: Home / Self Care | Payer: Commercial Managed Care - HMO | Source: Ambulatory Visit | Attending: Radiation Oncology | Admitting: Radiation Oncology

## 2015-11-22 ENCOUNTER — Telehealth: Payer: Self-pay | Admitting: Oncology

## 2015-11-22 ENCOUNTER — Ambulatory Visit (HOSPITAL_BASED_OUTPATIENT_CLINIC_OR_DEPARTMENT_OTHER): Payer: Commercial Managed Care - HMO | Admitting: Oncology

## 2015-11-22 ENCOUNTER — Other Ambulatory Visit: Payer: Self-pay | Admitting: Radiation Oncology

## 2015-11-22 ENCOUNTER — Other Ambulatory Visit: Payer: Self-pay | Admitting: Radiation Therapy

## 2015-11-22 ENCOUNTER — Encounter: Payer: Self-pay | Admitting: Radiation Oncology

## 2015-11-22 DIAGNOSIS — D492 Neoplasm of unspecified behavior of bone, soft tissue, and skin: Secondary | ICD-10-CM

## 2015-11-22 DIAGNOSIS — C649 Malignant neoplasm of unspecified kidney, except renal pelvis: Secondary | ICD-10-CM | POA: Insufficient documentation

## 2015-11-22 DIAGNOSIS — C7951 Secondary malignant neoplasm of bone: Secondary | ICD-10-CM

## 2015-11-22 DIAGNOSIS — C642 Malignant neoplasm of left kidney, except renal pelvis: Secondary | ICD-10-CM

## 2015-11-22 DIAGNOSIS — Z510 Encounter for antineoplastic radiation therapy: Secondary | ICD-10-CM | POA: Diagnosis not present

## 2015-11-22 DIAGNOSIS — C7931 Secondary malignant neoplasm of brain: Secondary | ICD-10-CM

## 2015-11-22 DIAGNOSIS — C7949 Secondary malignant neoplasm of other parts of nervous system: Principal | ICD-10-CM

## 2015-11-22 MED ORDER — OXYCODONE HCL 5 MG PO TABS: 5.0000 mg | ORAL_TABLET | ORAL | 0 refills | Status: DC | PRN

## 2015-11-22 MED ORDER — LORAZEPAM 0.5 MG PO TABS: ORAL_TABLET | ORAL | 0 refills | Status: DC

## 2015-11-22 NOTE — Progress Notes (Signed)
START ON PATHWAY REGIMEN - Renal Cell  RCOS23: Pazopanib 800 mg Daily   Daily:     Pazopanib (Votrient(R)) 800 mg Give daily by mouth without food (1 hr before or 2 hrs after meals) Dose Mod: None Additional Orders: Avoid concomitant use of strong CYP3A4 inhibitors and CYP3A4 inducers. Hepatic Impairment: Moderate: Reduce to 200 mg once daily. Severe (bilirubin > 3 times ULN with any ALT level): Use is not recommended.  Risk of febrile neutropenia is < 10% (low).  **Always confirm dose/schedule in your pharmacy ordering system**    Patient Characteristics: Metastatic, Clear Cell, First Line AJCC Stage Grouping: IV Current evidence of distant metastases? Yes AJCC T Stage: 1b AJCC N Stage: X AJCC M Stage: 1 Does patient have oligometastatic disease? No Would you be surprised if this patient died  in the next year? I would NOT be surprised if this patient died in the next year Histology: Clear Cell Line of therapy: First Line  Intent of Therapy: Non-Curative / Palliative Intent, Discussed with Patient

## 2015-11-22 NOTE — Patient Instructions (Signed)
Pazopanib oral tablets What is this medicine? PAZOPANIB (paz [place] pa nib) is a biologic drug used to treat kidney cancer and sarcoma. This medicine may be used for other purposes; ask your health care provider or pharmacist if you have questions. What should I tell my health care provider before I take this medicine? They need to know if you have any of these conditions: -bleeding problems -have had recent surgery (within 7 days) or are having surgery -heart disease -high blood pressure -history of stroke -liver disease -protein in your urine -stomach or intestine problems -thyroid problems -an unusual or allergic reaction to pazopanib, other medicines, foods, dyes, or preservatives -pregnant or trying to get pregnant -breast-feeding How should I use this medicine? Take this medicine by mouth with a glass of water. Follow the directions on the prescription label. Do not cut, crush or chew this medicine. Take this medicine on an empty stomach, at least 1 hour before or 2 hours after meals. Do not take with food. Do not take with grapefruit juice. Take your medicine at regular intervals. Do not take it more often than directed. Do not stop taking except on your doctor's advice. A special MedGuide will be given to you by the pharmacist with each prescription and refill. Be sure to read this information carefully each time. Talk to your pediatrician regarding the use of this medicine in children. Special care may be needed. Overdosage: If you think you have taken too much of this medicine contact a poison control center or emergency room at once. NOTE: This medicine is only for you. Do not share this medicine with others. What if I miss a dose? If you miss a dose, take it as soon as you can. If your next dose is to be taken in less than 12 hours, then do not take the missed dose. Take the next dose at your regular time. Do not take double or extra doses. What may interact with this medicine? Do  not take this medication with any of the following medications: -rifampin This medicine may also interact with the following medications: -certain medicines for stomach problems like cimetidine, famotidine, omeprazole, lansoprazole -clarithromycin -dextromethorphan -grapefruit or grapefruit juice -ketoconazole -lapatinib -certain medicines for irregular heart beat like amiodarone, bepridil, dofetilide, encainide, flecainide, propafenone, quinidine -midazolam -paclitaxel -ritonavir This list may not describe all possible interactions. Give your health care provider a list of all the medicines, herbs, non-prescription drugs, or dietary supplements you use. Also tell them if you smoke, drink alcohol, or use illegal drugs. Some items may interact with your medicine. What should I watch for while using this medicine? Visit your doctor for regular check ups. You will need to have blood work while you are taking this medicine. Call your doctor or health care professional for advice if you get a fever, chills or sore throat, or other symptoms of a cold or flu. Do not treat yourself. This drug decreases your body's ability to fight infections. Try to avoid being around people who are sick. This medicine may increase your risk to bruise or bleed. Call your doctor or health care professional if you notice any unusual bleeding. Do not become pregnant while taking this medicine or for 2 weeks after stopping it. Women should inform their doctor if they wish to become pregnant or think they might be pregnant. There is a potential for serious side effects to an unborn child. Talk to your health care professional or pharmacist for more information. Do not breast-feed an   infant while taking this medicine. This medicine may interfere with the ability to have a child. You should talk with your doctor or health care professional if you are concerned about your fertility. If you are going to have surgery or any other  procedures, tell your doctor you are taking this medicine. What side effects may I notice from receiving this medicine? Side effects that you should report to your doctor or health care professional as soon as possible: -abdominal pain -allergic reactions like skin rash, itching or hives, swelling of the face, lips, or tongue -black, tarry stool -breathing problems -changes in vision -chest pain -confusion, trouble speaking or understanding -cough -fast, irregular heartbeat -fever or chills, sore throat -seizures -severe headaches -shortness or breath -sudden numbness or weakness of the face, arm or leg -trouble passing urine or change in the amount of urine -trouble walking, dizziness, loss of balance or coordination -unusual bleeding or bruising -unusually weak or tired -yellowing of the eyes or skin Side effects that usually do not require medical attention (Report these to your doctor or health care professional if they continue or are bothersome.): -change in hair color -loose or watery stool -loss of appetite -nausea, vomiting -unusually weak or tired This list may not describe all possible side effects. Call your doctor for medical advice about side effects. You may report side effects to FDA at 1-800-FDA-1088. Where should I keep my medicine? Keep out of the reach of children. Store at room temperature between 15 and 30 degrees C (59 and 86 degrees F). Throw away any unused medicine after the expiration date. NOTE: This sheet is a summary. It may not cover all possible information. If you have questions about this medicine, talk to your doctor, pharmacist, or health care provider.    2016, Elsevier/Gold Standard. (2014-07-22 22:23:01)  

## 2015-11-22 NOTE — Progress Notes (Addendum)
Histology and Location of Primary Cancer: metastatic renal cell carcinoma  Sites of Visceral and Bony Metastatic Disease: left kidney mass, right eleventh costovertebral junction, anterior column of the left acetabulum  Location(s) of Symptomatic Metastases: L4              Past/Anticipated chemotherapy by medical oncology, if any: Dr. [name] plans to follow radiation with systemic pazopanib; further discussion in two weeks.  Pain on a scale of 0-10 is: persistent right hip pain 1 on a scale of 0-10 one hour following one oxycodone 5 mg tablet. Reports pain occasionally radiates to his right femur.    If Spine Met(s), symptoms, if any, include:  Bowel/Bladder retention or incontinence (please describe): Taking laxative as needed for constipation due to Hydrocodone  Numbness or weakness in extremities (please describe): reports occasional numbness in his right leg  Current Decadron regimen, if applicable: no. Prednisone taper not helpful  Ambulatory status? Walker? Wheelchair?: Ambulatory without assistance  SAFETY ISSUES:  Prior radiation? no  Pacemaker/ICD? No  Possible current pregnancy? no  Is the patient on methotrexate? no  Current Complaints / other details:  70 year old male. Married. 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 Denies any bowel or bladder complaints. Reports when his pain began initially he went to his chiropractor, then his PCP, then physical therapy and finally the MRI.

## 2015-11-22 NOTE — Progress Notes (Signed)
[place]         [phone] ________________________________  Initial outpatient Consultation  Name: Phillip [name] MRN: [id number]  Date: 11/22/2015  DOB: Nov 05, 1945  REFERRING PHYSICIAN: [name], MD  DIAGNOSIS: 70 yo man with a 6 cm expansile mass of the right L4 vertebral body suspicious for malignancy    ICD-9-CM ICD-10-CM   1. 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 male who was referred from Dr. Brayton Layman to Dr. Vertell Limber for evaluation of right hip and buttock pain. He participated in PT with no relief. An MRI revealed a right sided L4 malignant bone lesion which is causing compression of the L4 nerve root, multilevel bulging discs, and degeneration. He notes the right hip and buttock pain with a pain range from 3-7. He notes the pain in his right hip is a 5/10. He is taking Hydrocodone and Tylenol. Patient notes he is taking laxatives as needed for constipation due to Hydrocodone. He denies weakness or numbness in extremities. He denies any bowel or bladder complaints. The patient is ambulatory without assistance.  [name]  PREVIOUS RADIATION THERAPY: No  Past Medical History:  Diagnosis Date  . Hypertension   :   Past Surgical History:  Procedure Laterality Date  . IR GENERIC HISTORICAL  11/18/2015   IR FLUORO GUIDED NEEDLE PLC ASPIRATION/INJECTION LOC 11/18/2015 [name], MD [facility]-INTER[facility] RAD  :   Current Outpatient Prescriptions:  .  diclofenac (VOLTAREN) 75 MG EC tablet, Take 75 mg by mouth 2 (two) times daily. , Disp: , Rfl:  .  famotidine (PEPCID) 20 MG tablet, Take 20 mg by mouth 2 (two) times daily., Disp: , Rfl:  .  losartan-hydrochlorothiazide (HYZAAR) 100-12.5 MG tablet, Take 1 tablet by mouth daily. , Disp: , Rfl:  .  Multiple Vitamins-Minerals (MULTIVITAMIN WITH MINERALS) tablet, Take 1 tablet by mouth daily., Disp: , Rfl:  .  oxyCODONE (OXY IR/ROXICODONE) 5 MG immediate release  tablet, Take 1-2 tablets (5-10 mg total) by mouth every 4 (four) hours as needed for moderate pain., Disp: 100 tablet, Rfl: 0 .  acetaminophen (TYLENOL) 650 MG CR tablet, Take 650 mg by mouth every 8 (eight) hours as needed for pain., Disp: , Rfl:  .  LORazepam (ATIVAN) 0.5 MG tablet, 1 tablet po 30 minutes prior to radiation or MRI, Disp: 30 tablet, Rfl: 0:  No Known Allergies:   Family History  Problem Relation Age of Onset  . Cancer Grandchild     lymphoma  :   Social History   Social History  . Marital status: Single    Spouse name: N/A  . Number of children: N/A  . Years of education: N/A   Occupational History  . Not on file.   Social History Main Topics  . Smoking status: Never Smoker  . Smokeless tobacco: Never Used  . Alcohol use 0.6 oz/week    1 Glasses of wine per week  . Drug use:     Types: Marijuana  . Sexual activity: Not Currently   Other Topics Concern  . Not on file   Social History Narrative  . No narrative on file  :  REVIEW OF SYSTEMS:  A 15 point review of systems is documented in the electronic medical record. This was obtained by the nursing staff. However, I reviewed this with the patient to discuss relevant findings and make appropriate changes.  Pertinent items are noted in HPI.  PHYSICAL EXAM:  Blood pressure (!) 158/69, pulse (!) 59, resp. rate 18, height _0  (1.778 m), weight 259 lb 8 oz (117.7 kg), SpO2 100 %. General appearance: alert, cooperative and appears stated age Neck: no adenopathy Resp: clear to auscultation bilaterally Extremities: extremities normal, atraumatic, no cyanosis or edema Neurologic: Alert and oriented X 3, normal strength and tone. Normal symmetric reflexes. Normal coordination and gait   KPS = 90  100 - Normal; no complaints; no evidence of disease. 90   - Able to carry on normal activity; minor signs or symptoms of disease. 80   - Normal activity with effort; some signs or symptoms of disease. 32   -  Cares for self; unable to carry on normal activity or to do active work. 60   - Requires occasional assistance, but is able to care for most of his personal needs. 50   - Requires considerable assistance and frequent medical care. 70   - Disabled; requires special care and assistance. 57   - Severely disabled; hospital admission is indicated although death not imminent. 52   - Very sick; hospital admission necessary; active supportive treatment necessary. 10   - Moribund; fatal processes progressing rapidly. 0     - Dead  Karnofsky DA, Abelmann [place], Craver LS and Burchenal [facility] [phone]) The use of the nitrogen mustards in the palliative treatment of carcinoma: with particular reference to bronchogenic carcinoma Cancer 1 634-56  LABORATORY DATA:  Lab Results  Component Value Date   WBC 10.1 11/18/2015   HGB 13.5 11/18/2015   HCT 41.1 11/18/2015   MCV 88.6 11/18/2015   PLT 237 11/18/2015   Lab Results  Component Value Date   NA 140 11/14/2015   K 4.2 11/14/2015   CO2 27 11/14/2015   Lab Results  Component Value Date   ALT 48 11/14/2015   AST 20 11/14/2015   ALKPHOS 87 11/14/2015   BILITOT 0.46 11/14/2015     RADIOGRAPHY: Ct Chest W Contrast  Result Date: 11/18/2015 CLINICAL DATA:  Evaluate for metastatic disease. Suspicious lesion involving L4 vertebra on MRI. EXAM: CT CHEST, ABDOMEN, AND PELVIS WITH CONTRAST TECHNIQUE: Multidetector CT imaging of the chest, abdomen and pelvis was performed following the standard protocol during bolus administration of intravenous contrast. CONTRAST:  153m ISOVUE-300 IOPAMIDOL (ISOVUE-300) INJECTION 61% COMPARISON:  None. FINDINGS: CT CHEST FINDINGS Cardiovascular: The heart size is normal. No pericardial effusion. Aortic atherosclerosis noted. Mediastinum/Nodes: The trachea appears patent and is midline. Nodule arising from inferior pole left lobe of thyroid gland measures 1.6 cm, image 12 of series 2. Normal appearance of the esophagus. No enlarged  mediastinal or hilar lymph nodes. No axillary or supraclavicular adenopathy. Lungs/Pleura: There is no pleural effusion identified. Nodular density within the right lower lobe Measures 11 x 5 mm (mean diameter 8 mm), image 84 of series 6. There is no airspace consolidation or atelectasis identified. Musculoskeletal: The bones are mildly osteopenic. Expansile lytic lesion is identified involving the right eleventh rib at the costovertebral junction, image 55 of series T8. This measures 3.552.7 cm. CT ABDOMEN PELVIS FINDINGS Hepatobiliary: There is diffuse hepatic steatosis identified. Within the central aspect of the medial segment of left lobe of liver there is a focal area of low attenuation measuring 1.7 cm, image 54 series 2. Ste is identified within the lumen of the gallbladder Pancreas: No inflammation or mass identified. Spleen: The spleen appears normal. Adrenals/Urinary Tract: The adrenal glands are normal. The right kidney is unremarkable. Solid mass within  the upper pole the left kidney measures 5.4 x 5.0 cm, image 71 of series 2. There is a small cyst arising from the inferior pole the left kidney which measures 1.1 cm. The left renal vein appears patent. Stomach/Bowel: The stomach is normal. The small bowel loops have a normal course and caliber. No bowel obstruction. Normal appearance of the proximal colon. The distal colon is also unremarkable. Vascular/Lymphatic: Aortic atherosclerosis is identified. There is mild increased soft tissue surrounding the infrarenal abdominal aorta, image 85 of series 2. No discrete mass or no periaortic adenopathy. No pelvic or inguinal adenopathy. Reproductive: Prostate gland enlargement. Right-sided hydrocele is identified. Other: There is no ascites or focal fluid collections within the abdomen or pelvis. Musculoskeletal: Large expansile bone lesion involving the right side at L4 vertebra measures 6.1 x 4.8 cm, image 90 of series 2. There appears to be significant  canal involvement, image 90 of series 2. There is a lytic lesion identified within the anterior column of the left acetabulum measuring 9 mm. IMPRESSION: 1. Examination is positive for solitary left kidney mass worrisome for renal cell carcinoma. 2. 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 3. Mild increased soft tissue within the retroperitoneum is identified and is nonspecific. No discrete mass or adenopathy is identified. 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 4. Aortic atherosclerosis. 5. Right lower lobe lung nodule has a mean diameter of 8 mm. Electronically Signed   By: Kerby Moors M.D.   On: 11/18/2015 10:04   Mr Lumbar Spine Wo Contrast  Result Date: 11/05/2015 CLINICAL DATA:  Low back pain without sciatica. Weakness in the right foot starting 4 months ago. EXAM: MRI LUMBAR SPINE WITHOUT CONTRAST TECHNIQUE: Multiplanar, multisequence MR imaging of the lumbar spine was performed. No intravenous contrast was administered. COMPARISON:  None. FINDINGS: Segmentation:  Standard. Alignment:  Physiologic. Vertebrae: Large bone lesion in the L4 right posterior body, pedicle, lamina, transverse process, and articular processes. There is extraosseous extension in the right far-lateral space with possible psoas and intrinsic back muscle invasion. Infiltration in the right L4-5 more than L3-4 foramina with right L4 compression. Epidural infiltration along the right canal with advanced thecal sac mass effect. When accounting for T11 and L3, and L4 hemangiomas no other bone lesion is seen Conus medullaris: Extends to the T12 level and appears normal. Paraspinal and other soft tissues: Right far-lateral/ paraspinal tumor as above. Disc levels: Spinal canal stenosis is accentuated by short pedicles L2-L3: Mild disc bulging. Triangular narrowing of the thecal sac without suspected impingement L3-L4: Tumoral  stenosis in the right canal as described above. Mild facet hypertrophy and disc bulging. Moderate canal stenosis. L4-L5: Advanced canal stenosis from epidural tumor. There is a background of disc bulging and congenitally narrow canal. The right L4 nerve is completely encased/ compressed by tumor. L5-S1:Narrowing and bulging with central protrusion. Hyper trophic facet arthropathy. Moderate right foraminal stenosis. These results will be called to the ordering clinician or representative by the Radiologist Assistant, and communication documented in the PACS or zVision Dashboard. IMPRESSION: 1. 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 There is extraosseous epidural and right foraminal infiltration with advanced canal and foraminal stenosis. Metastasis or myeloma is favored. Recommend postcontrast imaging. 2. 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 Superimposed degenerative changes are mild for age. Electronically Signed   By: Monte Fantasia M.D.   On: 11/05/2015 20:02   Mr Lumbar  Spine W Wo Contrast  Result Date: 11/25/2015 CLINICAL DATA:  70 year old male with new diagnosis of metastatic renal cell carcinoma. Severe back pain. Large right L4 tumor affecting bone and paraspinal soft tissues on MRI 11/05/2015. Study for stereotactic radiosurgery planning. Subsequent encounter. EXAM: MRI LUMBAR SPINE WITHOUT AND WITH CONTRAST TECHNIQUE: Multiplanar and multiecho pulse sequences of the lumbar spine were obtained without and with intravenous contrast. CONTRAST:  20 mL MultiHance COMPARISON:  Lumbar MRI 11/05/2015. CT chest abdomen and pelvis 11/18/15. FINDINGS: Normal lumbar segmentation, concordant with the CT findings. Stable lumbar vertebral height and alignment. Relatively preserved lordosis. Mild retrolisthesis of L2 on L3. Large infiltrative and centrally necrotic appearing tumor occupying the L4 vertebra on the right from the central vertebral body across midline  (series 8, image 26) extending anteriorly as far as the anterior right vertebral body cortex, posteriorly through the right pedicle, infiltrating the posterior elements to just across midline at the level of the lamina (series 11, image 33), infiltrating most of the spinal canal at the L4 level (same image) and extending into the right ventral and dorsal paraspinal musculature. The tumor encompasses 86 x 83 by 60 mm (AP by transverse by CC) and does appear mildly enlarged since 11/05/2015. Most of the spinal canal extending to just left of midline at the L4 level is obliterated. The right L4 neural foramen is obliterated. Tumor infiltrate cephalad into the right L3 facet and lamina and severely stenosis the right L3 neural foramen. Tumor infiltrates the course of the extra foraminal right L3 nerve (series 11, image 23). The right L3 pedicle remain spared. Tumor infiltrates the superior articulating facet of L5, and also the the right L5 pedicle as seen on series 6, image 7, but does not yet affect the right L5 neural foramen. No other lumbar or upper sacrum metastatic disease. The lower margin of the destructive costovertebral junction metastasis on the right at T11 is faintly visible on series 6, image 4. Above the L3-L4 level there is no intrathecal tumor identified. The conus medullaris appears normal. No sacral intrathecal tumor is evident Stable visualized abdominal viscera. IMPRESSION: 1. Infiltrative tumor centered at the right L4 pedicle and transverse process with mild enlargement since 11/05/2015. Obliteration of the right L4 neural foramen and more than half of the spinal canal at the L4 level. 2. Tumor extension cephalad to the right L3 inferior articulating facet and partially involving the right L3 neural foramen and nerve course. Tumor extension caudal to the right L5 superior articulating facet and pedicle without right L5 foraminal involvement. 3. No other lumbar metastasis identified. No  disseminated intrathecal metastatic disease suspected. The right T11 costovertebral metastasis seen by CT recently is not included. Electronically Signed   By: Genevie Ann M.D.   On: 11/25/2015 18:28   Ct Abdomen Pelvis W Contrast  Result Date: 11/18/2015 CLINICAL DATA:  Evaluate for metastatic disease. Suspicious lesion involving L4 vertebra on MRI. EXAM: CT CHEST, ABDOMEN, AND PELVIS WITH CONTRAST TECHNIQUE: Multidetector CT imaging of the chest, abdomen and pelvis was performed following the standard protocol during bolus administration of intravenous contrast. CONTRAST:  152m ISOVUE-300 IOPAMIDOL (ISOVUE-300) INJECTION 61% COMPARISON:  None. FINDINGS: CT CHEST FINDINGS Cardiovascular: The heart size is normal. No pericardial effusion. Aortic atherosclerosis noted. Mediastinum/Nodes: The trachea appears patent and is midline. Nodule arising from inferior pole left lobe of thyroid gland measures 1.6 cm, image 12 of series 2. Normal appearance of the esophagus. No enlarged mediastinal or hilar lymph nodes. No axillary or  supraclavicular adenopathy. Lungs/Pleura: There is no pleural effusion identified. Nodular density within the right lower lobe Measures 11 x 5 mm (mean diameter 8 mm), image 84 of series 6. There is no airspace consolidation or atelectasis identified. Musculoskeletal: The bones are mildly osteopenic. Expansile lytic lesion is identified involving the right eleventh rib at the costovertebral junction, image 55 of series T8. This measures 3.552.7 cm. CT ABDOMEN PELVIS FINDINGS Hepatobiliary: There is diffuse hepatic steatosis identified. Within the central aspect of the medial segment of left lobe of liver there is a focal area of low attenuation measuring 1.7 cm, image 54 series 2. Ste is identified within the lumen of the gallbladder Pancreas: No inflammation or mass identified. Spleen: The spleen appears normal. Adrenals/Urinary Tract: The adrenal glands are normal. The right kidney is  unremarkable. Solid mass within the upper pole the left kidney measures 5.4 x 5.0 cm, image 71 of series 2. There is a small cyst arising from the inferior pole the left kidney which measures 1.1 cm. The left renal vein appears patent. Stomach/Bowel: The stomach is normal. The small bowel loops have a normal course and caliber. No bowel obstruction. Normal appearance of the proximal colon. The distal colon is also unremarkable. Vascular/Lymphatic: Aortic atherosclerosis is identified. There is mild increased soft tissue surrounding the infrarenal abdominal aorta, image 85 of series 2. No discrete mass or no periaortic adenopathy. No pelvic or inguinal adenopathy. Reproductive: Prostate gland enlargement. Right-sided hydrocele is identified. Other: There is no ascites or focal fluid collections within the abdomen or pelvis. Musculoskeletal: Large expansile bone lesion involving the right side at L4 vertebra measures 6.1 x 4.8 cm, image 90 of series 2. There appears to be significant canal involvement, image 90 of series 2. There is a lytic lesion identified within the anterior column of the left acetabulum measuring 9 mm. IMPRESSION: 1. Examination is positive for solitary left kidney mass worrisome for renal cell carcinoma. 2. 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 3. Mild increased soft tissue within the retroperitoneum is identified and is nonspecific. No discrete mass or adenopathy is identified. 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 4. Aortic atherosclerosis. 5. Right lower lobe lung nodule has a mean diameter of 8 mm. Electronically Signed   By: Kerby Moors M.D.   On: 11/18/2015 10:04   Ir Fluoro Guide Ndl Plmt / Bx  Result Date: 11/18/2015 [name], MD     11/18/2015  3:41 PM S/P L4 core biopsy via RT transpedicular approach. X 3 passes .Specimens to pathology.       IMPRESSION/PLAN:  1. 70 yo gentleman with Stage IV renal cell carcinoma with a  6 cm expansile mass of the right L4 vertebral body. The patient is a good candidate for SRS to the L4 vertebral body. We discussed this as well as his pathology with him. He has met our Spine navigator and will be set up for [facility] protocol MRI followed by [place] and treatment with Dr. Tammi Klippel and Dr. Vertell Limber.  The above documentation reflects my direct findings during this shared patient visit. Please see the separate note by Dr. Tammi Klippel on this date for the remainder of the patient's plan of care.     [name], PAC  This document serves as a record of services personally performed by Tyler Pita, MD. It was created on his behalf by Bethann Humble, a trained medical scribe. The creation of this  record is based on the scribe's personal observations and the provider's statements to them. This document has been checked and approved by the attending provider.      Please see the note from Shona Simpson, PA-C from today's visit for more details of today's encounter.  I have personally performed a face to face diagnostic evaluation on this patient and devised the assessment and plan.  ------------------------------------------------   Tyler Pita, MD [place] Director and Director of Stereotactic Radiosurgery Direct Dial: [phone]  Fax: [phone] [email or web address]  Skype  [email or web address]

## 2015-11-22 NOTE — Progress Notes (Signed)
1.  Do you need a wheel chair?    no  2. On oxygen? no  3. Have you ever had any surgery in the body part being scanned? no  4. Have you ever had any surgery on your brain or heart?   no                                                 5. Have you ever had surgery on your eyes or ears?      no                                                      6. Do you have a pacemaker or defibrillator? no    7. Do you have a Neurostimulator?        no  8. Claustrophobic?  Yes. Plans to take Ativan before Tx. 9. Any risk for metal in eyes?  no  10. Injury by bullet, buckshot, or shrapnel?  no  11. Stent?     no                                                                                                               12. Hx of Cancer? Yes,  Renal Cell with bone mets to Lumbar spine                                                                                                              13. Kidney or Liver disease?  Renal Carcinoma   14. Hx of Lupus, Rheumatoid Arthritis or Scleroderma?  no  15. IV Antibiotics or long term use of NSAIDS?  Voltaran x 3 months   16. HX of Hypertension?  Yes, controlled by meds   17. Diabetes?  no  18. Allergy to contrast?  no 19. Recent labs. Yes- in Epic   Questions answered by patient on 11/22/15 Mont Dutton R.T.(R)(T)  Special Procedures Navigator

## 2015-11-22 NOTE — Telephone Encounter (Signed)
Gv pt appt for 10/10. Pt will return to schedule chemo class after speaking with RT.

## 2015-11-22 NOTE — Progress Notes (Signed)
  [place] OFFICE PROGRESS NOTE   Diagnosis: Renal cell carcinoma  INTERVAL HISTORY:   Mr. Huckeby underwent a biopsy of L4 in interventional radiology on 11/18/2015. I discussed the case with the pathologist and he confirms the biopsy is consistent with renal cell carcinoma. A final report with immunohistochemical stains is pending. Mr. Engelstad continues to have pain in the right but not. Dilaudid does not help. He gets partial relief of the pain with oxycodone.  Objective:  Vital signs in last 24 hours:  There were no vitals taken for this visit.   Resp: Lungs clear bilaterally Cardio: Regular rate and rhythm GI: No hepatosplenomegaly Vascular: No leg edema   Lab Results:  Lab Results  Component Value Date   WBC 10.1 11/18/2015   HGB 13.5 11/18/2015   HCT 41.1 11/18/2015   MCV 88.6 11/18/2015   PLT 237 11/18/2015   NEUTROABS 5.8 11/14/2015      Imaging:  Ir Fluoro Guide Ndl Plmt / Bx  Result Date: 11/18/2015 [name], MD     11/18/2015  3:41 PM S/P L4 core biopsy via RT transpedicular approach. X 3 passes .Specimens to pathology.   Medications: I have reviewed the patient's current medications.  Assessment/Plan: 1. Metastatic renal cell carcinoma   L4 mass with extraosseous extension, L4 nerve compression  Biopsy of the L4 mass 11/18/2015 confirmed metastatic renal cell carcinoma, clear cell type  CTs of the chest, abdomen, and pelvis 11/18/2015-right lower lobe nodule, expansile lytic lesion at the right 11th rib/costal vertebral junction, left renal mass, expansile lesion involving the L4 vertebra, lytic lesion at the left acetabulum, and a low-attenuation liver lesion   2. Pain secondary to #1             3.   Hypertension   Disposition:  Mr. Skura has been diagnosed with metastatic renal cell carcinoma. I discussed treatment options with him. We discussed palliative radiation and systemic therapy.  He does not appear  to be a candidate for definitive therapy with tumor resection.  I recommend palliative radiation to the painful L4 lesion to be followed by systemic therapy with pazopanib.  We can consider immunotherapy options if he progresses following tyrosine kinase inhibitor therapy.  He was given reading materials on pazopanib today and he will attend a chemotherapy teaching class.  Mr. Corriea will see Dr. Tammi Klippel later today. He will return for an office visit and further discussion of systemic treatment options in approximately 2 weeks.  He will continue oxycodone as needed for pain.   Betsy Coder, MD  11/22/2015  10:46 AM

## 2015-11-24 ENCOUNTER — Ambulatory Visit
Admission: RE | Admit: 2015-11-24 | Discharge: 2015-11-24 | Disposition: A | Discharge status: Home / Self Care | Payer: Commercial Managed Care - HMO | Source: Ambulatory Visit | Attending: Radiation Oncology | Admitting: Radiation Oncology

## 2015-11-24 DIAGNOSIS — Z510 Encounter for antineoplastic radiation therapy: Secondary | ICD-10-CM | POA: Diagnosis not present

## 2015-11-24 DIAGNOSIS — D492 Neoplasm of unspecified behavior of bone, soft tissue, and skin: Secondary | ICD-10-CM

## 2015-11-25 ENCOUNTER — Ambulatory Visit
Admission: RE | Admit: 2015-11-25 | Discharge: 2015-11-25 | Disposition: A | Discharge status: Home / Self Care | Payer: Commercial Managed Care - HMO | Source: Ambulatory Visit | Attending: Radiation Oncology | Admitting: Radiation Oncology

## 2015-11-25 ENCOUNTER — Telehealth: Payer: Self-pay | Admitting: Oncology

## 2015-11-25 DIAGNOSIS — C7931 Secondary malignant neoplasm of brain: Secondary | ICD-10-CM

## 2015-11-25 DIAGNOSIS — C7949 Secondary malignant neoplasm of other parts of nervous system: Principal | ICD-10-CM

## 2015-11-25 NOTE — Progress Notes (Signed)
  Radiation Oncology         [phone] ________________________________  Name: Abimelec Grochowski MRN: [id number]  Date: 11/24/2015  DOB: 11-May-1945  STEREOTACTIC BODY RADIOTHERAPY SIMULATION AND TREATMENT PLANNING NOTE    ICD-9-CM ICD-10-CM   1. 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  Identity was confirmed.  All relevant records and images related to the planned course of therapy were reviewed.  The patient freely provided informed written consent to proceed with treatment after reviewing the details related to the planned course of therapy. The consent form was witnessed and verified by the simulation staff.  Then, the patient was set-up in a stable reproducible  supine position for radiation therapy.  A BodyFix immobilization pillow was fabricated for reproducible positioning.  Surface markings were placed.  The CT images were loaded into the planning software.  The gross target volumes (GTV) and planning target volumes (PTV) were delinieated, and avoidance structures were contoured.  Treatment planning then occurred.  The radiation prescription was entered and confirmed.  A total of two complex treatment devices were fabricated in the form of the BodyFix immobilization pillow and a neck accuform cushion.  I have requested : 3D Simulation  I have requested a DVH of the following structures: targets and all normal structures near the target including bowel, kidneys, and skin as noted on the radiation plan to maintain doses in adherence with established limits  PLAN:  The patient will receive 18 Gy in 1 fraction.  ________________________________  Sheral Apley Tammi Klippel, M.D.

## 2015-11-25 NOTE — Telephone Encounter (Signed)
10/05 Chemo Education class scheduled per pt. request and 09/26 LOS. Pt. Called and requested to have class day before first treatment. Per the patient, wanted information so that he would not be as nervous during the treatment.

## 2015-11-28 ENCOUNTER — Ambulatory Visit: Payer: Medicare HMO | Admitting: Oncology

## 2015-11-28 DIAGNOSIS — Z510 Encounter for antineoplastic radiation therapy: Secondary | ICD-10-CM | POA: Diagnosis not present

## 2015-11-29 ENCOUNTER — Telehealth: Payer: Self-pay | Admitting: *Deleted

## 2015-11-29 ENCOUNTER — Telehealth: Payer: Self-pay | Admitting: Radiation Oncology

## 2015-11-29 NOTE — Telephone Encounter (Signed)
Received message from [name], patient's significant other, questioning if the patient should obtain a  flu, shingles, and pneumonia shot prior to chemotherapy and radiation. Returned call. No answer. Left message explaining that from a radiation standpoint these vaccines are not contraindicated. However, strongly encouraged her to reach out to Dr. [name] for this answer as this RN is unsure of how med onc would advise. Explained this RN had left a message with Dr. Gearldine Shown nurse requesting she call Ms. Wise with directions. Left my contact information and encouraged Ms. Wise to call back with any further questions.

## 2015-11-29 NOTE — Telephone Encounter (Signed)
Return call placed to [name] to inform her that per Dr. [name], Mr. Grissett can receive the flu shot at his next appt on 12/06/15 with L. Marcello Moores NP.  [name] is appreciative of call and has no other questions at this time.

## 2015-11-30 ENCOUNTER — Encounter: Payer: Self-pay | Admitting: *Deleted

## 2015-11-30 NOTE — Progress Notes (Signed)
[facility] Psychosocial Distress Screening Clinical Social Work  Clinical Social Work was referred by distress screening protocol.  The patient scored a 5 on the Psychosocial Distress Thermometer which indicates moderate distress. Clinical Social Worker phoned pt to assess for distress and other psychosocial needs. Pt reports to have good support through family, church and friends. Pt reports to be doing well currently and has gotten many of these concerns addressed at his appointments. CSW reviewed resources through [employer] and options for support. Pt reports to be attending Chemo Class tomorrow and CSW informed him he would receive more information about our programs at the class. Pt appreciated call and agrees to reach out as needed.   ONCBCN DISTRESS SCREENING 11/14/2015  Screening Type Initial Screening  Distress experienced in past week (1-10) 5  Emotional problem type Adjusting to illness  Information Concerns Type Lack of info about diagnosis  Physical Problem type Pain;Getting around  Physician notified of physical symptoms Yes    Clinical Social Worker follow up needed: No.  If yes, follow up plan: Loren Racer, [place]  [facility] Phone: [phone] Fax: [phone]

## 2015-12-01 ENCOUNTER — Other Ambulatory Visit: Payer: Self-pay | Admitting: *Deleted

## 2015-12-01 ENCOUNTER — Other Ambulatory Visit: Payer: Commercial Managed Care - HMO

## 2015-12-01 ENCOUNTER — Encounter: Payer: Self-pay | Admitting: *Deleted

## 2015-12-01 ENCOUNTER — Telehealth: Payer: Self-pay | Admitting: *Deleted

## 2015-12-01 DIAGNOSIS — Z510 Encounter for antineoplastic radiation therapy: Secondary | ICD-10-CM | POA: Diagnosis not present

## 2015-12-01 MED ORDER — ONDANSETRON HCL 8 MG PO TABS: 8.0000 mg | ORAL_TABLET | Freq: Three times a day (TID) | ORAL | 0 refills | Status: DC | PRN

## 2015-12-01 NOTE — Telephone Encounter (Signed)
Message left on patient's private cell phone to inform him that Zofran prescription has been sent in to [facility] in [place].

## 2015-12-02 ENCOUNTER — Ambulatory Visit
Admission: RE | Admit: 2015-12-02 | Discharge: 2015-12-02 | Disposition: A | Discharge status: Home / Self Care | Payer: Commercial Managed Care - HMO | Source: Ambulatory Visit | Attending: Radiation Oncology | Admitting: Radiation Oncology

## 2015-12-02 ENCOUNTER — Encounter: Payer: Self-pay | Admitting: Radiation Oncology

## 2015-12-02 VITALS — BP 124/59 | HR 60 | Temp 98.6°F

## 2015-12-02 DIAGNOSIS — C7949 Secondary malignant neoplasm of other parts of nervous system: Secondary | ICD-10-CM

## 2015-12-02 DIAGNOSIS — Z510 Encounter for antineoplastic radiation therapy: Secondary | ICD-10-CM | POA: Diagnosis not present

## 2015-12-02 NOTE — Op Note (Signed)
   Name: Phillip [name]  MRN: [id number]  Date: 12/02/2015   DOB: 1945/05/29  Stereotactic Radiosurgery Operative Note  PRE-OPERATIVE DIAGNOSIS:  Spinal Metastasis  POST-OPERATIVE DIAGNOSIS:  Spinal Metastasis  PROCEDURE:  Stereotactic Radiosurgery  SURGEON:  Peggyann Shoals, MD  NARRATIVE: The patient underwent a radiation treatment planning session in the radiation oncology simulation suite under the care of the radiation oncology physician and physicist.  I participated closely in the radiation treatment planning afterwards. The patient underwent planning CT myelogram which was fused to the MRI.  These images were fused on the planning system.  Radiation oncology contoured the gross target volume and subsequently expanded this to yield the Planning Target Volume. I actively participated in the planning process.  I helped to define and review the target contours and also the contours of the spinal cord, and selected nearby organs at risk.  All the dose constraints for critical structures were reviewed and compared to AAPM Task Group 101.  The prescription dose conformity was reviewed.  I approved the plan electronically.    Accordingly, Phillip [name] was brought to the TrueBeam stereotactic radiation treatment linac and placed in the custom immobilization device.  The patient was aligned according to the IR fiducial markers with BrainLab Exactrac, then orthogonal x-rays were used in ExacTrac with the 6DOF robotic table and the shifts were made to align the patient.  Then conebeam CT was performed to verify precision.  Phillip [name] received stereotactic radiosurgery uneventfully.  The detailed description of the procedure is recorded in the radiation oncology procedure note.  I was present for the duration of the procedure.  DISPOSITION:  Following delivery, the patient was transported to nursing in stable condition and monitored for possible acute effects to be discharged to home in stable  condition with follow-up in one month.  Peggyann Shoals, MD 12/02/2015 4:56 PM

## 2015-12-02 NOTE — Progress Notes (Signed)
Mr. Hye completed SRS to lumbar spine.  No voiced concerns at this time. VSS. Denies any pain.  Released after 20-30 minute observation with his spouse.  Advised to call if he has any increasing or new pain and he stated compliance.

## 2015-12-02 NOTE — Progress Notes (Signed)
  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  It showed 3-dimensional radiation distributions overlaid onto the planning CT/MRI image set.  The [facility] for the target structures as well as the organs at risk were reviewed. The documentation of the 3D plan and dosimetry are filed in the radiation oncology EMR.  NARRATIVE:  Phillip [name] was brought to the TrueBeam stereotactic radiation treatment machine and placed supine on the CT couch. The patient was precisely re-positioned in their BodyFix immobilization device, and the patient was set up for stereotactic radiosurgery.  Neurosurgery was present for the set-up and delivery  SIMULATION VERIFICATION:  In the couch zero-angle position, the patient underwent Exactrac imaging using the Brainlab system with orthogonal KV images to position the target accounting for translation and rotational factors.  These were carefully aligned and repeated to confirm treatment position.  Then, cone beam CT was performed to help further verify placement and make any final translational shifts.  SPECIAL TREATMENT PROCEDURE: Phillip [name] received stereotactic radiosurgery to the following targets:  The targeted metastasis in the L4 vertebral body was treated using 3 Rapid Arc VMAT Beams to a prescription dose of 10 out of 50 Gy to the gross disease (GTV) seen on imaging, while a secondary lower prescription dose of 6 out of 30 Gy was delivered to the entirety of each directly involved marrow compartment of the gross  disease (CTV).    The beams were delivered with 6 MV X-rays in the flattening filter free mode.  STEREOTACTIC TREATMENT MANAGEMENT:  Following delivery, the patient was transported to nursing in stable condition and monitored for possible acute effects.  Vital signs were recorded BP (!) 124/59   Pulse 60   Temp 98.6 F (37 C) (Oral) . The patient tolerated treatment without significant acute effects, and was discharged to home in stable condition.    PLAN: Follow-up in one month.  ________________________________  Sheral Apley. Tammi Klippel, M.D.  This document serves as a record of services personally performed by Tyler Pita, MD. It was created on his behalf by Darcus Austin, a trained medical scribe. The creation of this record is based on the scribe's personal observations and the provider's statements to them. This document has been checked and approved by the attending provider.

## 2015-12-05 ENCOUNTER — Ambulatory Visit
Admission: RE | Admit: 2015-12-05 | Discharge: 2015-12-05 | Disposition: A | Discharge status: Home / Self Care | Payer: Commercial Managed Care - HMO | Source: Ambulatory Visit | Attending: Radiation Oncology | Admitting: Radiation Oncology

## 2015-12-05 VITALS — BP 163/63 | HR 58 | Temp 98.5°F

## 2015-12-05 DIAGNOSIS — C7949 Secondary malignant neoplasm of other parts of nervous system: Secondary | ICD-10-CM

## 2015-12-05 DIAGNOSIS — Z510 Encounter for antineoplastic radiation therapy: Secondary | ICD-10-CM | POA: Diagnosis not present

## 2015-12-05 NOTE — Progress Notes (Signed)
  Radiation Oncology         [phone] ________________________________  Spinal Stereotactic Radiosurgery Procedure Note  Name: Phillip [name] MRN: [id number]  Date: 12/05/2015  DOB: [birth year]  SPECIAL TREATMENT PROCEDURE    ICD-9-CM ICD-10-CM   1. 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  It showed 3-dimensional radiation distributions overlaid onto the planning CT/MRI image set.  The [facility] for the target structures as well as the organs at risk were reviewed. The documentation of the 3D plan and dosimetry are filed in the radiation oncology EMR.  NARRATIVE:  Phillip [name] was brought to the TrueBeam stereotactic radiation treatment machine and placed supine on the CT couch. The patient was precisely re-positioned in their BodyFix immobilization device, and the patient was set up for stereotactic radiosurgery.  Neurosurgery was present for the set-up and delivery  SIMULATION VERIFICATION:  In the couch zero-angle position, the patient underwent Exactrac imaging using the Brainlab system with orthogonal KV images to position the target accounting for translation and rotational factors.  These were carefully aligned and repeated to confirm treatment position.  Then, cone beam CT was performed to help further verify placement and make any final translational shifts.  SPECIAL TREATMENT PROCEDURE: Phillip [name] received stereotactic radiosurgery to the following targets:  The targeted metastasis in the L4 vertebral body was treated using 3 Rapid Arc VMAT Beams to a prescription dose of 10 out of 50 Gy to the gross disease (GTV) seen on imaging, while a secondary lower prescription dose of 6 out of 30 Gy was delivered to the entirety of each directly involved marrow compartment of the gross  disease (CTV).    The beams were delivered with 6 MV X-rays in the flattening filter free mode.  STEREOTACTIC TREATMENT MANAGEMENT:  Following delivery, the patient was transported to nursing in stable condition and monitored for possible acute effects.  Vital signs were recorded BP (!) 163/63 (BP Location: Left Arm, Patient Position: Sitting)   Pulse (!) 58   Temp 98.5 F (36.9 C) (Oral)   SpO2 99% . The patient tolerated treatment without significant acute effects, and was discharged to home in stable condition.    PLAN: Follow-up in one month.  ________________________________  Sheral Apley. Tammi Klippel, M.D.  This document serves as a record of services personally performed by Tyler Pita, MD. It was created on his behalf by Darcus Austin, a trained medical scribe. The creation of this record is based on the scribe's personal observations and the provider's statements to them. This document has been checked and approved by the attending provider.

## 2015-12-05 NOTE — Progress Notes (Signed)
Patient discharged from the clinic accompanied by his wife.

## 2015-12-05 NOTE — Progress Notes (Signed)
[name] is here for post [facility] monitoring.  He reports having sciatic pain in both gluteal areas and is taking pain medication.  He will be monitored for 15 minutes.

## 2015-12-06 ENCOUNTER — Telehealth: Payer: Self-pay | Admitting: Oncology

## 2015-12-06 ENCOUNTER — Ambulatory Visit (HOSPITAL_BASED_OUTPATIENT_CLINIC_OR_DEPARTMENT_OTHER): Payer: Commercial Managed Care - HMO | Admitting: Nurse Practitioner

## 2015-12-06 VITALS — BP 156/62 | HR 65 | Temp 98.4°F | Resp 18 | Ht 70.0 in | Wt 255.4 lb

## 2015-12-06 DIAGNOSIS — C7949 Secondary malignant neoplasm of other parts of nervous system: Secondary | ICD-10-CM

## 2015-12-06 DIAGNOSIS — C649 Malignant neoplasm of unspecified kidney, except renal pelvis: Secondary | ICD-10-CM | POA: Diagnosis not present

## 2015-12-06 MED ORDER — LORAZEPAM 0.5 MG PO TABS: 0.5000 mg | ORAL_TABLET | Freq: Every evening | ORAL | 0 refills | Status: DC | PRN

## 2015-12-06 MED ORDER — OXYCODONE HCL 5 MG PO TABS: 5.0000 mg | ORAL_TABLET | ORAL | 0 refills | Status: DC | PRN

## 2015-12-06 MED ORDER — ONDANSETRON HCL 8 MG PO TABS: 8.0000 mg | ORAL_TABLET | Freq: Three times a day (TID) | ORAL | 1 refills | Status: DC | PRN

## 2015-12-06 NOTE — Telephone Encounter (Signed)
Gave patient avs report and appointments for november

## 2015-12-06 NOTE — Progress Notes (Addendum)
  [place] OFFICE PROGRESS NOTE   Diagnosis:  Renal cell carcinoma  INTERVAL HISTORY:   Phillip [name] returns as scheduled. Last night he noted improvement in back pain. He was able to go about 8 hours between doses of pain medication. Bowels are moving. He is having intermittent nausea. He thinks the nausea may be related to the pain medication. He took a dose of Ativan at bedtime and noted improvement in sleep.  Objective:  Vital signs in last 24 hours:  Blood pressure (!) 156/62, pulse 65, temperature 98.4 F (36.9 C), temperature source Oral, resp. rate 18, height 5\' 10"  (1.778 m), weight 255 lb 6.4 oz (115.8 kg), SpO2 98 %.    HEENT: No thrush or ulcers. Resp: Lungs clear bilaterally. Cardio: Regular rate and rhythm. GI: Abdomen soft and nontender. No hepatomegaly. Vascular: No leg edema.   Lab Results:  Lab Results  Component Value Date   WBC 10.1 11/18/2015   HGB 13.5 11/18/2015   HCT 41.1 11/18/2015   MCV 88.6 11/18/2015   PLT 237 11/18/2015   NEUTROABS 5.8 11/14/2015    Imaging:  No results found.  Medications: I have reviewed the patient's current medications.  Assessment/Plan: 1. Metastatic renal cell carcinoma   L4 mass with extraosseous extension, L4 nerve compression  Biopsy of the L4 mass 11/18/2015 confirmed metastatic renal cell carcinoma, clear cell type  CTs of the chest, abdomen, and pelvis 11/18/2015-right lower lobe nodule, expansile lytic lesion at the right 11th rib/costal vertebral junction, left renal mass, expansile lesion involving the L4 vertebra, lytic lesion at the left acetabulum, and a low-attenuation liver lesion  Initiation of SRS to L4 12/02/2015   2. Pain secondary to #1 3. Hypertension   Disposition: Phillip [name] appears stable. He is completing a course of radiation to L4. The last day of radiation is 12/12/2015.  Dr. [name] recommends Pazopanib 800 mg daily. We reviewed potential toxicities  including hypertension, proteinuria, pneumonitis, reversible posterior leukoencephalopathy syndrome, wound dehiscence, rash, diarrhea, bleeding, hematologic toxicity. He has attended the chemotherapy education class. Anticipated start date 12/19/2015.  He will continue oxycodone as needed for back pain. He was provided with a new OxyContin prescription at today's visit as well as prescriptions for Zofran and Ativan. He understands that he should not be driving while taking these medications.  He is taking multiple supplements. We recommended he discontinue the supplements prior to beginning Pazopanib.   He will return for labs and a follow-up visit approximately 2 weeks after beginning Pazopanib.   Patient seen with Dr. [name]. 25 minutes were spent face-to-face at today's visit with the majority of that time involved in counseling/coordination of care.  [name] ANP/GNP-BC   12/06/2015  1:33 PM  This was a shared visit with [name]. Phillip [name] is completing palliative radiation to the spine. The plan is to begin pazopanib at the completion of radiation. We reviewed the potential toxicities associated with pazopanib and he agrees to proceed.  Julieanne Manson, M.D.

## 2015-12-07 ENCOUNTER — Telehealth: Payer: Self-pay | Admitting: Pharmacist

## 2015-12-07 ENCOUNTER — Encounter: Payer: Self-pay | Admitting: Radiation Oncology

## 2015-12-07 ENCOUNTER — Ambulatory Visit
Admission: RE | Admit: 2015-12-07 | Discharge: 2015-12-07 | Disposition: A | Discharge status: Home / Self Care | Payer: Commercial Managed Care - HMO | Source: Ambulatory Visit | Attending: Radiation Oncology | Admitting: Radiation Oncology

## 2015-12-07 ENCOUNTER — Other Ambulatory Visit: Payer: Self-pay | Admitting: Radiation Oncology

## 2015-12-07 VITALS — BP 139/63 | HR 57 | Temp 98.5°F

## 2015-12-07 DIAGNOSIS — C7949 Secondary malignant neoplasm of other parts of nervous system: Secondary | ICD-10-CM

## 2015-12-07 DIAGNOSIS — Z510 Encounter for antineoplastic radiation therapy: Secondary | ICD-10-CM | POA: Diagnosis not present

## 2015-12-07 MED ORDER — METHYLPREDNISOLONE 4 MG PO TBPK: ORAL_TABLET | ORAL | 0 refills | Status: DC

## 2015-12-07 NOTE — Telephone Encounter (Signed)
Received new rx for Pazopanib. Reviewed labs - ok for treatment. Blood pressure results on 10/10 and 10/9 slightly elevated. 10/9 = 163/63 10/10 = 156/62 Patient taking Hyzaar. Alerted MD as Pazopanib can cause hypertension. Will monitor.  Drug interactions reviewed and noted: Pazopanib and famotidine:  Famotidine may decrease pazopanib concentrations due to an increase in gastric pH. Category X (Avoid) Pazopanib and Ondansetron: Pazopanib may increase QTc prolonging effects of Zofran. Category D (consider therapy modification)  Alerted MD to above.  Faxed to [facility] outpatient pharmacy.  Fax confirmation received.  Raul Del, PharmD, [place], [facility] [phone]

## 2015-12-07 NOTE — Progress Notes (Signed)
  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  It showed 3-dimensional radiation distributions overlaid onto the planning CT/MRI image set.  The [facility] for the target structures as well as the organs at risk were reviewed. The documentation of the 3D plan and dosimetry are filed in the radiation oncology EMR.  NARRATIVE:  Phillip [name] was brought to the TrueBeam stereotactic radiation treatment machine and placed supine on the CT couch. The patient was precisely re-positioned in their BodyFix immobilization device, and the patient was set up for stereotactic radiosurgery.  Neurosurgery was present for the set-up and delivery  SIMULATION VERIFICATION:  In the couch zero-angle position, the patient underwent Exactrac imaging using the Brainlab system with orthogonal KV images to position the target accounting for translation and rotational factors.  These were carefully aligned and repeated to confirm treatment position.  Then, cone beam CT was performed to help further verify placement and make any final translational shifts.  SPECIAL TREATMENT PROCEDURE: Phillip [name] received stereotactic radiosurgery to the following targets:  The targeted metastasis in the L4 vertebral body was treated using 3 Rapid Arc VMAT Beams to a prescription dose of 10 out of 50 Gy to the gross disease (GTV) seen on imaging, while a secondary lower prescription dose of 6 out of 30 Gy was delivered to the entirety of each directly involved marrow compartment of the gross  disease (CTV).    The beams were delivered with 6 MV X-rays in the flattening filter free mode.  STEREOTACTIC TREATMENT MANAGEMENT:  Following delivery, the patient was transported to nursing in stable condition and monitored for possible acute effects.  Vital signs were recorded BP 139/63   Pulse (!) 57   Temp 98.5 F (36.9 C)   SpO2 98% Comment: room air. The patient tolerated treatment without significant acute effects, and was discharged to home in stable condition.    PLAN: Complete treatment, then follow-up in one month.  ________________________________  Sheral Apley. Tammi Klippel, M.D.  This document serves as a record of services personally performed by Tyler Pita, MD. It was created on his behalf by Darcus Austin, a trained medical scribe. The creation of this record is based on the scribe's personal observations and the provider's statements to them. This document has been checked and approved by the attending provider.

## 2015-12-08 ENCOUNTER — Telehealth: Payer: Self-pay | Admitting: Pharmacist

## 2015-12-08 NOTE — Telephone Encounter (Signed)
Oral Chemotherapy Pharmacist Encounter  Discussed drug interaction between Pepcid and Votrient due to dramatically decreased Votrient absorption with increase in gastric pH with MD. MD recommends d/c of Pepcid.  I called patient this morning and spoke to his partner, [name], about MD recommendation. The patient was started on Pepcid BID when he was placed on Voltaren for pain. Pain is currently being controlled with oxycodone. Per [name], patient also placed on steroid taper yesterday. She is agreeable to d/c'ing Pepcid at end of steroid taper.  Initial counseling for Votrient offered, partner declined at this time. She will call the oral chemo office with any questions.  Plan: - stop Voltaren today - continue steroid taper through early next week - stop Pepcid on last day of steroid taper - reassess pain next week with patient off Voltaren - start Votrient at end of steroid taper  Prior authorization is required for Votrient. PA submitted on covermymeds.com Key# [id number] Outcome is favorable, will receive final determination in 24-48 hours  Oral Chemo Clinic will continue to follow  [name], PharmD, BCPS 12/08/2015  10:10 AM Oral Chemotherapy Clinic [phone]

## 2015-12-09 ENCOUNTER — Ambulatory Visit
Admission: RE | Admit: 2015-12-09 | Discharge: 2015-12-09 | Disposition: A | Discharge status: Home / Self Care | Payer: Commercial Managed Care - HMO | Source: Ambulatory Visit | Attending: Radiation Oncology | Admitting: Radiation Oncology

## 2015-12-09 ENCOUNTER — Encounter: Payer: Self-pay | Admitting: Radiation Oncology

## 2015-12-09 ENCOUNTER — Telehealth: Payer: Self-pay | Admitting: Radiation Oncology

## 2015-12-09 DIAGNOSIS — C7949 Secondary malignant neoplasm of other parts of nervous system: Secondary | ICD-10-CM

## 2015-12-09 DIAGNOSIS — Z510 Encounter for antineoplastic radiation therapy: Secondary | ICD-10-CM | POA: Diagnosis not present

## 2015-12-09 MED ORDER — MORPHINE SULFATE ER 30 MG PO CP24: 30.0000 mg | ORAL_CAPSULE | Freq: Two times a day (BID) | ORAL | 0 refills | Status: DC

## 2015-12-09 MED ORDER — GABAPENTIN 400 MG PO CAPS: 400.0000 mg | ORAL_CAPSULE | Freq: Three times a day (TID) | ORAL | 2 refills | Status: DC

## 2015-12-09 NOTE — Progress Notes (Addendum)
Patient brought to room 1 at nursing, patient in w/c,wife at side, no c/o nausea, states pain 2/10 scale lumbar, s/p SRS treatment, vitals WNL 95% room air sats,  Monitor patient 15 minutes, MD in with patient discussing pain mgt, will d/c home via w/c  To car,knows to call for any unusual symptoms occur  To call on Call MD 3:57 PM BP (!) 143/69 (BP Location: Left Arm, Patient Position: Sitting, Cuff Size: Normal)   Pulse 65   Temp 98.7 F (37.1 C) (Oral)   Resp 20   SpO2 95% Comment: room a ir

## 2015-12-09 NOTE — Telephone Encounter (Signed)
Received fax message from after hours [facility] triage staff that patient has been able to pick up Medrol dosepak. Phoned pharmacy. Unable to reach staff thus, left message with prescription for Medrol dosepak. Phoned Phillip [name], significant other, to inform her this was done. She reports the on call physician called in the script that night and they picked it up. She confirms the patients pain was well controlled yesterday but, "not good today." She reports the patient is taking oxycodone as directed but not getting much relief. She understands pain management can be discussed further when patient presents for treatment today. She expressed understanding and appreciation for the call.

## 2015-12-10 NOTE — Progress Notes (Signed)
  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  It showed 3-dimensional radiation distributions overlaid onto the planning CT/MRI image set.  The [facility] for the target structures as well as the organs at risk were reviewed. The documentation of the 3D plan and dosimetry are filed in the radiation oncology EMR.  NARRATIVE:  Phillip [name] was brought to the TrueBeam stereotactic radiation treatment machine and placed supine on the CT couch. The patient was precisely re-positioned in their BodyFix immobilization device, and the patient was set up for stereotactic radiosurgery.  Neurosurgery was present for the set-up and delivery  SIMULATION VERIFICATION:  In the couch zero-angle position, the patient underwent Exactrac imaging using the Brainlab system with orthogonal KV images to position the target accounting for translation and rotational factors.  These were carefully aligned and repeated to confirm treatment position.  Then, cone beam CT was performed to help further verify placement and make any final translational shifts.  SPECIAL TREATMENT PROCEDURE: Phillip [name] received stereotactic radiosurgery to the following targets:  The targeted metastasis in the L4 vertebral body was treated using 3 Rapid Arc VMAT Beams to a prescription dose of 10 out of 50 Gy to the gross disease (GTV) seen on imaging, while a secondary lower prescription dose of 6 out of 30 Gy was delivered to the entirety of each directly involved marrow compartment of the gross  disease (CTV).    The beams were delivered with 6 MV X-rays in the flattening filter free mode.  STEREOTACTIC TREATMENT MANAGEMENT:  Following delivery, the patient was transported to nursing in stable condition and monitored for possible acute effects.  Vital signs were recorded BP (!) 142/80 (BP Location: Left Arm, Patient Position: Sitting, Cuff Size: Large)   Pulse 62   Temp 99.3 F (37.4 C) (Oral)   Resp 18   SpO2 100% . The patient tolerated treatment without significant acute effects, and was discharged to home in stable condition.    PLAN: Complete treatment today, and follow-up in one month.  ________________________________  Sheral Apley. Tammi Klippel, M.D.  This document serves as a record of services personally performed by Tyler Pita, MD. It was created on his behalf by Darcus Austin, a trained medical scribe. The creation of this record is based on the scribe's personal observations and the provider's statements to them. This document has been checked and approved by the attending provider.

## 2015-12-11 NOTE — Progress Notes (Signed)
  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  It showed 3-dimensional radiation distributions overlaid onto the planning CT/MRI image set.  The [facility] for the target structures as well as the organs at risk were reviewed. The documentation of the 3D plan and dosimetry are filed in the radiation oncology EMR.  NARRATIVE:  Phillip [name] was brought to the TrueBeam stereotactic radiation treatment machine and placed supine on the CT couch. The patient was precisely re-positioned in their BodyFix immobilization device, and the patient was set up for stereotactic radiosurgery.  Neurosurgery was present for the set-up and delivery  SIMULATION VERIFICATION:  In the couch zero-angle position, the patient underwent Exactrac imaging using the Brainlab system with orthogonal KV images to position the target accounting for translation and rotational factors.  These were carefully aligned and repeated to confirm treatment position.  Then, cone beam CT was performed to help further verify placement and make any final translational shifts.  SPECIAL TREATMENT PROCEDURE: [name] received stereotactic radiosurgery to the following targets:  The targeted metastasis in the L4 vertebral body was treated using 3 Rapid Arc VMAT Beams to a prescription dose of 10 out of 50 Gy to the gross disease (GTV) seen on imaging, while a secondary lower prescription dose of 6 out of 30 Gy was delivered to the entirety of each directly involved marrow compartment of the gross  disease (CTV).    The beams were delivered with 6 MV X-rays in the flattening filter free mode.  STEREOTACTIC TREATMENT MANAGEMENT:  Following delivery, the patient was transported to nursing in stable condition and monitored for possible acute effects.  Vital signs were recorded There were no vitals taken for this visit.. The patient tolerated treatment without significant acute effects, and was discharged to home in stable condition.    PLAN: Complete treatment Monday, then follow-up in one month.  ________________________________  Sheral Apley. Tammi Klippel, M.D.  This document serves as a record of services personally performed by Tyler Pita, MD. It was created on his behalf by Darcus Austin, a trained medical scribe. The creation of this record is based on the scribe's personal observations and the provider's statements to them. This document has been checked and approved by the attending provider.

## 2015-12-12 ENCOUNTER — Encounter: Payer: Self-pay | Admitting: Radiation Oncology

## 2015-12-12 ENCOUNTER — Ambulatory Visit
Admission: RE | Admit: 2015-12-12 | Discharge: 2015-12-12 | Disposition: A | Discharge status: Home / Self Care | Payer: Commercial Managed Care - HMO | Source: Ambulatory Visit | Attending: Radiation Oncology | Admitting: Radiation Oncology

## 2015-12-12 VITALS — BP 142/80 | HR 62 | Temp 99.3°F | Resp 18

## 2015-12-12 DIAGNOSIS — Z510 Encounter for antineoplastic radiation therapy: Secondary | ICD-10-CM | POA: Diagnosis not present

## 2015-12-12 DIAGNOSIS — C7949 Secondary malignant neoplasm of other parts of nervous system: Secondary | ICD-10-CM

## 2015-12-12 NOTE — Progress Notes (Signed)
Nurse monitoring complete following final srs treatment. No distress noted. Reports continued lumbar spine pain. Discussed pain medication regimen. Will inform Dr. Tammi Klippel and Shona Simpson, PA-C of these findings. One month follow up appointment card given. Patient discharged home with wife in wheelchair.

## 2015-12-13 ENCOUNTER — Telehealth: Payer: Self-pay | Admitting: Radiation Oncology

## 2015-12-13 NOTE — Telephone Encounter (Signed)
-----   Message from Hayden Pedro, PA-C sent at 12/13/2015  1:00 PM EDT ----- Regarding: FW: Pain management update He can't take it due to his chemo- if voltaren was held I don't think he could use Ibuprofen/aleve... He also has been on steroids so... I think I'd double up his oxycodone to 10mg  if he wants to try that... ----- Message ----- From: Heywood Footman, RN Sent: 12/12/2015   2:12 PM To: Tyler Pita, MD, # Subject: Pain management update                         I spent some time with Phillip [name] and his wife following srs today. [name] reports that overall his pain seems to be slightly less. However, his wife and Phillip [name] report that with movement his pain seems worse as evidence by grimacing/facial expressions. Patient confirms his pain remains in his gluteus and radiates down the back of his upper legs. Reports today he began taking neurontin 400 mg tid and tapered of his prednisone this morning. He is taking morphine 30 mg every 12 hours and oxycodone 5 mg every five hours or so instead of every 4. In addition, he has stopped Voltaren and Pepcid because it was contraindicated with his chemo.    Should we leave things the way they are? His lack of an anti inflammatory concerns me. Should it?  Phillip [name]

## 2015-12-13 NOTE — Telephone Encounter (Signed)
Phoned patient to access status and further discuss pain management. No answer. Left message requesting return call.

## 2015-12-13 NOTE — Telephone Encounter (Signed)
The other option would be to increase his MS contin to TID until he notices some improvement. It's an off label use of the medication, so she needs to be aware of that, but a change like that might help. I don't want to just double him to 60 mg BID because the hope is he won't need the medication for too much longer... Let me know if they want to do that, because if he's taking an extra 40 mg of oxycodone per day, he needs to bump up his long acting.

## 2015-12-13 NOTE — Telephone Encounter (Signed)
Patient's wife, Phillip [name], returning my call. She reports the patient feels as though overall his pain is getting better. Explained that he could double his oxycodone to 10 mg if need be. Phillip [name] explains the patient is already taking 10 mg every five hours. Discussed pain management ie. Not allowing pain to escalate above a five before intervening. Understanding was verbalized. All agreed to continue the current pain regimen and call this RN with changes. Phillip [name] explains she plans to administer the patient a fleets enema because he hasn't had a bowel movement since starting the MS Contin. Advised Miralax daily while taking narcotics once bowels are clear. Phillip [name] agreed and verbalized understanding.

## 2015-12-13 NOTE — Telephone Encounter (Signed)
-----   Message from Hayden Pedro, PA-C sent at 12/13/2015  1:00 PM EDT ----- Regarding: FW: Pain management update He can't take it due to his chemo- if voltaren was held I don't think he could use Ibuprofen/aleve... He also has been on steroids so... I think I'd double up his oxycodone to 10mg  if he wants to try that... ----- Message ----- From: Heywood Footman, RN Sent: 12/12/2015   2:12 PM To: Tyler Pita, MD, # Subject: Pain management update                         I spent some time with Shaunn and his wife following srs today. Kiren reports that overall his pain seems to be slightly less. However, his wife and Manuela Schwartz report that with movement his pain seems worse as evidence by grimacing/facial expressions. Patient confirms his pain remains in his gluteus and radiates down the back of his upper legs. Reports today he began taking neurontin 400 mg tid and tapered of his prednisone this morning. He is taking morphine 30 mg every 12 hours and oxycodone 5 mg every five hours or so instead of every 4. In addition, he has stopped Voltaren and Pepcid because it was contraindicated with his chemo.    Should we leave things the way they are? His lack of an anti inflammatory concerns me. Should it?  Sam

## 2015-12-14 ENCOUNTER — Other Ambulatory Visit: Payer: Self-pay | Admitting: *Deleted

## 2015-12-14 NOTE — Telephone Encounter (Signed)
Call from pt's S/O requesting refill on Oxycodone. He will run out on Saturday. Informed [name] that prescription will be available for pick up on 10/19.

## 2015-12-15 ENCOUNTER — Telehealth: Payer: Self-pay | Admitting: Radiation Oncology

## 2015-12-15 ENCOUNTER — Telehealth: Payer: Self-pay | Admitting: *Deleted

## 2015-12-15 ENCOUNTER — Other Ambulatory Visit: Payer: Self-pay | Admitting: *Deleted

## 2015-12-15 DIAGNOSIS — C649 Malignant neoplasm of unspecified kidney, except renal pelvis: Secondary | ICD-10-CM

## 2015-12-15 DIAGNOSIS — C7949 Secondary malignant neoplasm of other parts of nervous system: Secondary | ICD-10-CM

## 2015-12-15 MED ORDER — OXYCODONE HCL 5 MG PO TABS: 5.0000 mg | ORAL_TABLET | ORAL | 0 refills | Status: DC | PRN

## 2015-12-15 NOTE — Telephone Encounter (Signed)
Received call from [name] (retired Marine scientist, significant other) this morning that the patient's "pain just isn't controlled." She goes onto say that the patient can't lay flat or stand up straight without increased pain. Per Shona Simpson, PA-C order explained the other option would be to increase his Ms Contin to three times per day until he notices an improvement. Stressed that once improvement was noted he should revert back to twice daily because tid is an off label use of this medication. Stressed the hope is he won't need "all this pain medication" must longer. She verbalized understanding and expressed appreciation for the return call.

## 2015-12-15 NOTE — Telephone Encounter (Signed)
Call placed to Phillip [name] to inform her that Oxycodone prescription is ready for pick up.  She states that she spoke with Aldona Bar in radiation this AM and per Dr. Tammi Klippel, patient is to increase Morphine to every 8 hrs d/t patient's increased complaints of pain.  Will inform Dr. [name].  No other questions or concerns at this time.

## 2015-12-15 NOTE — Progress Notes (Signed)
  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   Beams/energy:  SBRT/SRT-VMAT // 10FFF  Narrative: The patient tolerated SRS treatment well.   There were no signs of acute toxicity after treatment.  He still had pain on completion.  Plan: The patient has completed radiation treatment. The patient will return to radiation oncology clinic for routine followup in one month. I advised the patient to call or return sooner if they have any questions or concerns related to their recovery or treatment.  ------------------------------------------------   Tyler Pita, MD [place] Director and Director of Stereotactic Radiosurgery Direct Dial: [phone]  Fax: [phone] [email or web address]  Skype  LinkedIn  This document serves as a record of services personally performed by Tyler Pita, MD. It was created on his behalf by Arlyce Harman, a trained medical scribe. The creation of this record is based on the scribe's personal observations and the provider's statements to them. This document has been checked and approved by the attending provider.

## 2015-12-19 ENCOUNTER — Telehealth: Payer: Self-pay | Admitting: Pharmacist

## 2015-12-19 NOTE — Telephone Encounter (Signed)
Oral Chemotherapy Pharmacist Encounter  Received notification from patient's insurance [facility]) that Votrient is covered under pharmacy benefits. I called [facility] with this information to run patient's prescription. Copay ~ $3000 I LVM for [name], patient's partner, with this information and that I would be starting assistance application for help from the manufacturer. I requested that they provide number of people in household, household income for 2016, and copies of proof of income to be included in application to [employer].  I left number to oral chemo office on voicemail. Oral Chemo Clinic will continue to follow.  Thank you,  [name], PharmD, BCPS 12/19/2015  2:30 PM Oral Chemotherapy Clinic [phone]

## 2015-12-20 NOTE — Telephone Encounter (Signed)
Oral Chemotherapy Pharmacist Encounter  I met patient's partner, [name], in the lobby this morning with Mr. Knechtel financial information and to get patient signatures for [facility] application.  I formation and signatures obtained.  Completed PANO application faxed along with SS benefits information and insurance information to [phone].  We will continue to follow for determination and will inform patient as we have information.  [name], PharmD, BCPS 12/20/2015  12:42 PM Oral Chemotherapy Clinic [phone]

## 2015-12-22 ENCOUNTER — Telehealth: Payer: Self-pay | Admitting: *Deleted

## 2015-12-22 ENCOUNTER — Telehealth: Payer: Self-pay | Admitting: Pharmacist

## 2015-12-22 NOTE — Telephone Encounter (Signed)
Voicemail from partner: "requesting refill on Oxycodone and to talk to the RN about amount of pain he's having and talk through chemotherapy.  Runs out of Oxycodone Sunday." This nurse returned call.  "I do not mean to sound ungrateful for the call but I would like to talk with Dominica or Lavella Lemons .  They know he and I."  Offered again to talk with her.    "I am a nurse.  He is still in a lot of pain with no help from the radiation.  He takes the morphine pain medicine but I backed down to two pills a day because he was sleeping all day.  I am concerned that he needs more evaluation before we start the (Votrient) oral chemotherapy.  I can pick up the prescription tomorrow.  Thanks for calling but I need to talk with someone about his treatment plan.  I'm at a loss and do not know what to do at this point." Offered understanding of her apprehension, supporting her request, this nurse will notify Dr. Gearldine Shown assistant of the call.  Return number [phone].

## 2015-12-22 NOTE — Telephone Encounter (Signed)
Received fax notification from [employer] that Votrient Benefits Investigation is complete.   Phone:  [phone] Fax [phone]  Will continue to follow.  Raul Del, PharmD, BCPS, BCOP Oral Chemotherapy Clinic [phone]

## 2015-12-22 NOTE — Telephone Encounter (Signed)
Reviewed call with [name], NP: We can see pt in office 10/27 @ 0945 to evaluate pain, discuss plan. Returned call to [place], she reports pt's mobility has improved though he still seems to be in quite a bit of pain. Offered an office visit, she will discuss with pt. Returned call to [place], pt declined office visit. Will need to pick up Oxycodone refill on 10/27 as he will run out over the weekend. Pt is taking 2tabs Q4 hours. Per [name], pt does plan to start Votrient.

## 2015-12-23 ENCOUNTER — Other Ambulatory Visit: Payer: Self-pay | Admitting: *Deleted

## 2015-12-23 DIAGNOSIS — C7949 Secondary malignant neoplasm of other parts of nervous system: Secondary | ICD-10-CM

## 2015-12-23 DIAGNOSIS — C649 Malignant neoplasm of unspecified kidney, except renal pelvis: Secondary | ICD-10-CM

## 2015-12-23 MED ORDER — PAZOPANIB HCL 200 MG PO TABS: 800.0000 mg | ORAL_TABLET | Freq: Every day | ORAL | 0 refills | Status: DC

## 2015-12-23 MED ORDER — OXYCODONE HCL 5 MG PO TABS: 5.0000 mg | ORAL_TABLET | ORAL | 0 refills | Status: DC | PRN

## 2015-12-23 NOTE — Telephone Encounter (Signed)
Oral Chemotherapy Pharmacist Encounter  Received call from [facility] in regards to benefits investigation for patient for Votrient. Benefits investigation is complete, I requested that patient's application be forwarded to their foundation for medication assistance consideration. They requested a new prescription for Votrient to be sent in order to forward application to foundation.  Dr. [name] is out of the office. Lavella Lemons, RN printed new prescription for Dr. [name] to sign we he returns 12/27/15. I will then fax Rx to [facility] at [phone]. Patient's name and DOB will be included on cover sheet.  I also inquired about requested 14-day free trial. That had not been processed by [facility]. I requested that they begin processesing this request.  Oral Chemo Clinic will continue to follow.  [name], PharmD, BCPS 12/23/2015  12:48 PM Oral Chemotherapy Clinic [phone]

## 2015-12-29 ENCOUNTER — Telehealth: Payer: Self-pay | Admitting: Radiation Oncology

## 2015-12-29 ENCOUNTER — Other Ambulatory Visit: Payer: Self-pay | Admitting: Radiation Oncology

## 2015-12-29 DIAGNOSIS — C7949 Secondary malignant neoplasm of other parts of nervous system: Secondary | ICD-10-CM

## 2015-12-29 NOTE — Telephone Encounter (Signed)
Fax palliative care referral paperwork to [phone]. Confirmation fax of delivery obtain. Spoke with Phillip [name] and Phillip [name] at [facility] and confirmed receipt of referral. Finally, phoned patient's wife, Phillip [name], making her aware all this was done and to expect a call from palliative care in the very near future. She verbalized understanding and expressed appreciation for the call.

## 2015-12-29 NOTE — Telephone Encounter (Signed)
Returned significant other, [name]'s, call. She is concerned the patient's pain still isn't well controlled. Additionally she reports he starts chemotherapy today. Offered referral to Dr. [name] for pain management and out patient palliative care until appointment with [facility]. She request referral to [facility] be placed but, asked to call back after speaking with the patient about palliative care. In addition, she questioned if Dr. Tammi Klippel or Dr. Vertell Limber plan to repeat any imaging to determine if Dr. Vertell Limber has any further options for intervention.

## 2016-01-02 ENCOUNTER — Telehealth: Payer: Self-pay | Admitting: Radiation Oncology

## 2016-01-02 ENCOUNTER — Encounter (HOSPITAL_COMMUNITY): Payer: Self-pay | Admitting: Interventional Radiology

## 2016-01-02 NOTE — Telephone Encounter (Addendum)
Received voicemail message that palliative care had not been out despite referral on November 2. Spoke with [facility] [phone]). She reports she was out of the office on Friday thus the referral didn't get put in. She reports she will enter the referral right away and Vonna [employer] will be in touch with the patient. Stress this is an urgent referral and that the patient fell last night but, didn't sustain any injuries per his wife. Phoned patient's wife back and informed her of these findings.

## 2016-01-02 NOTE — Telephone Encounter (Signed)
Oral Chemotherapy Pharmacist Encounter  Received notification that patient had been approved from free 14-day trial of Votrient from [facility]. Oral Chemo Clinic will continue to follow.  [name], PharmD, BCPS 01/02/2016  2:40 PM Oral Chemotherapy Clinic [phone]

## 2016-01-03 NOTE — Telephone Encounter (Signed)
Oral Chemotherapy Pharmacist Encounter  Received notification from [employer] that patient may be eligible for social security help through a low income subsidy program. Patient is to call [employer] Medicare LIS Hotline at [phone] to see if he can enroll in this program. The [employer] will grant patient a 90-day supply of Votrient while this other program is being investigated.  I called the [place] [phone] for further clarification before I called to inform patient and partner [name]. The Foundation has screened patient for any available assistance. I was not able to complete application for the patient, patient will have to call above hotline number. The Foundation stated that they can help patient through his application process to see if he will qualify for this assistance. If patient does not qualify, then the [employer] will supply the Votrient to Mr. Doss.  I also verified that they would be sending some additional meds to patient at this time and was told that the Foundation had shipped a 30-day supply of Votrient on 11/3 that was delivered today.  I LVM for [name] with above information.  Oral Chemo Clinic will continue to follow.   [name], PharmD, BCPS 01/03/2016  3:55 PM Oral Chemotherapy Clinic [phone]

## 2016-01-04 ENCOUNTER — Telehealth: Payer: Self-pay | Admitting: Oncology

## 2016-01-04 ENCOUNTER — Other Ambulatory Visit: Payer: Self-pay | Admitting: *Deleted

## 2016-01-04 ENCOUNTER — Ambulatory Visit (HOSPITAL_BASED_OUTPATIENT_CLINIC_OR_DEPARTMENT_OTHER): Payer: Commercial Managed Care - HMO | Admitting: Oncology

## 2016-01-04 ENCOUNTER — Other Ambulatory Visit (HOSPITAL_BASED_OUTPATIENT_CLINIC_OR_DEPARTMENT_OTHER): Payer: Commercial Managed Care - HMO

## 2016-01-04 VITALS — BP 148/56 | HR 66 | Temp 99.1°F | Resp 18 | Ht 70.0 in | Wt 247.0 lb

## 2016-01-04 DIAGNOSIS — G893 Neoplasm related pain (acute) (chronic): Secondary | ICD-10-CM

## 2016-01-04 DIAGNOSIS — C649 Malignant neoplasm of unspecified kidney, except renal pelvis: Secondary | ICD-10-CM

## 2016-01-04 DIAGNOSIS — C7951 Secondary malignant neoplasm of bone: Secondary | ICD-10-CM

## 2016-01-04 DIAGNOSIS — I10 Essential (primary) hypertension: Secondary | ICD-10-CM

## 2016-01-04 DIAGNOSIS — C7949 Secondary malignant neoplasm of other parts of nervous system: Secondary | ICD-10-CM

## 2016-01-04 LAB — CBC WITH DIFFERENTIAL/PLATELET
BASO%: 0.2 % (ref 0.0–2.0)
Basophils Absolute: 0 10*3/uL (ref 0.0–0.1)
EOS%: 5.6 % (ref 0.0–7.0)
Eosinophils Absolute: 0.5 10*3/uL (ref 0.0–0.5)
HCT: 37 % — ABNORMAL LOW (ref 38.4–49.9)
HGB: 12.5 g/dL — ABNORMAL LOW (ref 13.0–17.1)
LYMPH%: 11.9 % — ABNORMAL LOW (ref 14.0–49.0)
MCH: 28.7 pg (ref 27.2–33.4)
MCHC: 33.8 g/dL (ref 32.0–36.0)
MCV: 85.1 fL (ref 79.3–98.0)
MONO#: 0.9 10*3/uL (ref 0.1–0.9)
MONO%: 9.7 % (ref 0.0–14.0)
NEUT#: 6.8 10*3/uL — ABNORMAL HIGH (ref 1.5–6.5)
NEUT%: 72.6 % (ref 39.0–75.0)
Platelets: 209 10*3/uL (ref 140–400)
RBC: 4.35 10*6/uL (ref 4.20–5.82)
RDW: 12.6 % (ref 11.0–14.6)
WBC: 9.4 10*3/uL (ref 4.0–10.3)
lymph#: 1.1 10*3/uL (ref 0.9–3.3)

## 2016-01-04 LAB — CEA (IN HOUSE-CHCC): CEA (CHCC-In House): 1.77 ng/mL (ref 0.00–5.00)

## 2016-01-04 LAB — COMPREHENSIVE METABOLIC PANEL
ALT: 40 U/L (ref 0–55)
AST: 22 U/L (ref 5–34)
Albumin: 3.1 g/dL — ABNORMAL LOW (ref 3.5–5.0)
Alkaline Phosphatase: 76 U/L (ref 40–150)
Anion Gap: 14 mEq/L — ABNORMAL HIGH (ref 3–11)
BUN: 11 mg/dL (ref 7.0–26.0)
CO2: 29 mEq/L (ref 22–29)
Calcium: 10.1 mg/dL (ref 8.4–10.4)
Chloride: 96 mEq/L — ABNORMAL LOW (ref 98–109)
Creatinine: 0.8 mg/dL (ref 0.7–1.3)
EGFR: 88 mL/min/{1.73_m2} — ABNORMAL LOW (ref >90)
Glucose: 215 mg/dl — ABNORMAL HIGH (ref 70–140)
Potassium: 4.2 mEq/L (ref 3.5–5.1)
Sodium: 138 mEq/L (ref 136–145)
Total Bilirubin: 0.5 mg/dL (ref 0.20–1.20)
Total Protein: 7.5 g/dL (ref 6.4–8.3)

## 2016-01-04 LAB — UA PROTEIN, DIPSTICK - CHCC: Protein, ur: NEGATIVE mg/dL

## 2016-01-04 LAB — TSH: TSH: 0.135 m(IU)/L — ABNORMAL LOW (ref 0.320–4.118)

## 2016-01-04 MED ORDER — LORAZEPAM 0.5 MG PO TABS: 0.5000 mg | ORAL_TABLET | Freq: Every evening | ORAL | 0 refills | Status: DC | PRN

## 2016-01-04 MED ORDER — MORPHINE SULFATE ER 30 MG PO CP24: 30.0000 mg | ORAL_CAPSULE | Freq: Two times a day (BID) | ORAL | 0 refills | Status: DC

## 2016-01-04 MED ORDER — OXYCODONE HCL 5 MG PO TABS
5.0000 mg | ORAL_TABLET | ORAL | 0 refills | Status: DC | PRN
Start: 2016-01-04 — End: 2016-02-06

## 2016-01-04 NOTE — Telephone Encounter (Signed)
Appointments scheduled per 11/8 LOS. Patient given AVS report and calendars of future scheduled appointments. °

## 2016-01-04 NOTE — Progress Notes (Signed)
  [place] OFFICE PROGRESS NOTE   Diagnosis: Renal cell carcinoma  INTERVAL HISTORY:   Mr. Hochstetler returns as scheduled. He completed radiation to the spine on 12/12/2015. He began pazopanib 12/30/2015. No apparent side effect from the chemotherapy. He continues to have pain at the right buttock. The pain is constant. The pain has improved compared to pre-radiation. He takes MS Contin twice daily and oxycodone every 4 hours. He developed somnolence when he increase the MS Contin to 3 times per day. His bowels are moving.  Objective:  Vital signs in last 24 hours:  Blood pressure (!) 148/56, pulse 66, temperature 99.1 F (37.3 C), temperature source Oral, resp. rate 18, height 5\' 10"  (1.778 m), weight 247 lb (112 kg), SpO2 99 %.    HEENT: No thrush or ulcers Resp: Lungs clear bilaterally Cardio: Regular rate and rhythm GI: No hepatosplenomegaly Vascular: No leg edema Neuro: Alert and oriented  Skin: No rash     Lab Results:  Lab Results  Component Value Date   WBC 9.4 01/04/2016   HGB 12.5 (L) 01/04/2016   HCT 37.0 (L) 01/04/2016   MCV 85.1 01/04/2016   PLT 209 01/04/2016   NEUTROABS 6.8 (H) 01/04/2016     Medications: I have reviewed the patient's current medications.  Assessment/Plan: 1. Metastatic renal cell carcinoma   L4 mass with extraosseous extension, L4 nerve compression  Biopsy of the L4 mass 11/18/2015 confirmed metastatic renal cell carcinoma, clear cell type  CTs of the chest, abdomen, and pelvis 11/18/2015-right lower lobe nodule, expansile lytic lesion at the right 11th rib/costal vertebral junction, left renal mass, expansile lesion involving the L4 vertebra, lytic lesion at the left acetabulum, and a low-attenuation liver lesion  Initiation of SRS to L4 12/02/2015, Completed 12/12/2015  Initiation of Pazopanib 12/30/2015   2. Pain secondary to #1 3. Hypertension    Disposition:  His overall status appears unchanged.  He continues to have pain secondary to metastatic disease involving the lower spine. He will continue MS Contin and oxycodone. The drowsiness may be in part related to gabapentin. He will eliminate the daytime dosing of gabapentin and takes this only at night.  Mr. Reczek will continue pazopanib. He will return for an office visit in 2 weeks. He has an appointment with home palliative care today.  Betsy Coder, MD  01/04/2016  10:29 AM

## 2016-01-04 NOTE — Addendum Note (Signed)
Addended by: Betsy Coder B on: 01/04/2016 11:29 AM   Modules accepted: Orders

## 2016-01-04 NOTE — Addendum Note (Signed)
Addended by: [place] on: 01/04/2016 12:19 PM   Modules accepted: Orders

## 2016-01-05 ENCOUNTER — Other Ambulatory Visit: Payer: Self-pay | Admitting: *Deleted

## 2016-01-05 ENCOUNTER — Telehealth: Payer: Self-pay | Admitting: *Deleted

## 2016-01-05 DIAGNOSIS — C649 Malignant neoplasm of unspecified kidney, except renal pelvis: Secondary | ICD-10-CM

## 2016-01-05 DIAGNOSIS — C7949 Secondary malignant neoplasm of other parts of nervous system: Secondary | ICD-10-CM

## 2016-01-05 MED ORDER — OXYCODONE HCL ER 40 MG PO T12A: 40.0000 mg | EXTENDED_RELEASE_TABLET | Freq: Two times a day (BID) | ORAL | 0 refills | Status: DC

## 2016-01-05 NOTE — Telephone Encounter (Signed)
Message received from [name] stating that they were not able to pick up prescription for MS Contin from [facility] in [place] d/t prior auth needed per [place].  Message left for Gaspar Bidding regarding prescription.

## 2016-01-05 NOTE — Progress Notes (Addendum)
Mr. Noell Fresch 70 yr. Man with lumbar spine metastasis here for one month follow up appointment for [facility] lumbar spine  Pain on a scale of 0-10 is: 3/10 to right buttock taking Oxycodone and Oxy IR,  Has an appointment January 18, 2016 with Dr. [name] arrival time is 1330. Bowel/Bladder retention or incontinence (please describe): Having frequency and urgency to void, bowel medium to form stools using Miralax and colace. Numbness or weakness in extremities (please describe): Was weak after radiation ;Feels that his extremities are getting stronger now. Fatigue:Having fatigue some days more than others. Current Decadron regimen, if applicable: No  Ambulatory status? Walker? Wheelchair?: Using walker at home. Wt Readings from Last 3 Encounters:  01/09/16 245 lb 6.4 oz (111.3 kg)  01/04/16 247 lb (112 kg)  12/06/15 255 lb 6.4 oz (115.8 kg)  10 pound weight loss over the past month. BP (!) 153/59   Pulse 68   Temp 99.7 F (37.6 C) (Oral)   Resp 18   Ht 5\' 10"  (1.778 m)   Wt 245 lb 6.4 oz (111.3 kg)   SpO2 97%   BMI 35.21 kg/m

## 2016-01-05 NOTE — Telephone Encounter (Signed)
Call received at 1:00PM from [facility] NP with [facility] palliative care team recommending that patient be switched from [place] Contin to Oxycontin.  Dr. [name] notified and order received from Dr. [name] to discontinue MS Contin and order Oxycontin 40 mg every 12 hrs for patient.  [name] notified of prescription change and will come to [facility] to pick up prescription for patient.

## 2016-01-05 NOTE — Telephone Encounter (Signed)
Call placed to [facility] in [place] this AM to ask for PA information to be faxed to [facility].  They stated that PA is not needed and that patient is in the donut hole for his insurance and the prescription will cost the patient $237.00.  Pharmacist states that she will contact [name] with information.

## 2016-01-09 ENCOUNTER — Ambulatory Visit
Admission: RE | Admit: 2016-01-09 | Discharge: 2016-01-09 | Disposition: A | Discharge status: Home / Self Care | Payer: Commercial Managed Care - HMO | Source: Ambulatory Visit | Attending: Radiation Oncology | Admitting: Radiation Oncology

## 2016-01-09 ENCOUNTER — Encounter: Payer: Self-pay | Admitting: Radiation Oncology

## 2016-01-09 VITALS — BP 153/59 | HR 68 | Temp 99.7°F | Resp 18 | Ht 70.0 in | Wt 245.4 lb

## 2016-01-09 DIAGNOSIS — C649 Malignant neoplasm of unspecified kidney, except renal pelvis: Secondary | ICD-10-CM

## 2016-01-09 DIAGNOSIS — R300 Dysuria: Secondary | ICD-10-CM

## 2016-01-09 DIAGNOSIS — C7949 Secondary malignant neoplasm of other parts of nervous system: Secondary | ICD-10-CM

## 2016-01-09 LAB — URINE MICROSCOPIC-ADD ON
Bacteria, UA: NONE SEEN
Squamous Epithelial / LPF: NONE SEEN
WBC, UA: NONE SEEN WBC/hpf (ref 0–5)

## 2016-01-09 LAB — URINALYSIS, ROUTINE W REFLEX MICROSCOPIC
Bilirubin Urine: NEGATIVE
Glucose, UA: 100 mg/dL — AB
Ketones, ur: 15 mg/dL — AB
Leukocytes, UA: NEGATIVE
Nitrite: NEGATIVE
Protein, ur: NEGATIVE mg/dL
Specific Gravity, Urine: 1.02 (ref 1.005–1.030)
pH: 6.5 (ref 5.0–8.0)

## 2016-01-09 NOTE — Progress Notes (Signed)
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  Interval Since Last Radiation:  4 weeks   12/02/2015 to 10/16/2017SBRT Treatment: The L4 Right spinal was treated to 50 Gy in 5 fractions at 10 Gy per fraction.  Narrative:  The patient returns today for routine follow-up. During the course of meeting this patient, he has had trouble with management of his lumbar pain and sciatica. He has used a combination of narcotics and had tried gabapentin. Unfortunately he did not find Gabapentin to be helpful, and depended mainly on oxycodone and long acting Morphine. He also did try a steroid taper which did not seem to make a difference. He continues to be mobile, and has an appointment with Dr. [name] next week. He met with palliative care as well and has been switched from long acting morphine to Oxycontin. He continues to be mobile and is able to walk but spends much of his days in a recliner to stay comfortable. He continues on miralax for constipation as well as colace. He reports urinary frequency, and episodes of urgency in the last few days.  He denies any fevers at home but in the clinic has a temperature of 99.7.  No other complaints are noted.  ALLERGIES:  has No Known Allergies.  Meds: Current Outpatient Prescriptions  Medication Sig Dispense Refill  . docusate sodium (COLACE) 100 MG capsule Take 100 mg by mouth 2 (two) times daily.    [name] gabapentin (NEURONTIN) 400 MG capsule Take 1 capsule (400 mg total) by mouth 3 (three) times daily. (Patient taking differently: Take 400 mg by mouth at bedtime. ) 90 capsule 2  . LORazepam (ATIVAN) 0.5 MG tablet 1 tablet po 30 minutes prior to radiation or MRI 30 tablet 0  . LORazepam (ATIVAN)  0.5 MG tablet Take 1 tablet (0.5 mg total) by mouth at bedtime as needed for anxiety. 30 tablet 0  . losartan-hydrochlorothiazide (HYZAAR) 100-12.5 MG tablet Take 1 tablet by mouth daily.     [name] oxyCODONE (OXY IR/ROXICODONE) 5 MG immediate release tablet Take 1-2 tablets (5-10 mg total) by mouth every 4 (four) hours as needed for moderate pain. 150 tablet 0  . oxyCODONE (OXYCONTIN) 40 mg 12 hr tablet Take 1 tablet (40 mg total) by mouth every 12 (twelve) hours. 60 tablet 0  . polyethylene glycol (MIRALAX / GLYCOLAX) packet Take 17 g by mouth daily.    . ondansetron (ZOFRAN) 8 MG tablet Take 1 tablet (8 mg total) by mouth every 8 (eight) hours as needed for nausea or vomiting. (Patient not taking: Reported on 01/09/2016) 30 tablet 1   No current facility-administered medications for this encounter.     Physical Findings:  height is '5\' 10"'$  (1.778 m) and weight is 245 lb 6.4 oz (111.3 kg). His oral temperature is 99.7 F (37.6 C). His blood pressure is 153/59 (abnormal) and his pulse is 68. His respiration is 18 and oxygen saturation is 97%.  In general this is a well appearing caucasian male in no acute distress. He's alert and oriented x4 and appropriate throughout the examination. Cardiopulmonary assessment is negative for acute distress and he exhibits normal effort.   Lab Findings: Lab Results  Component Value Date  WBC 9.4 01/04/2016   HGB 12.5 (L) 01/04/2016   HCT 37.0 (L) 01/04/2016   MCV 85.1 01/04/2016   PLT 209 01/04/2016     Radiographic Findings: No results found.  Impression/Plan: 1. 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 The patient tolerated radiotherapy well without significant difficulty. We will plan MRI in 3 months for serial surveillance. He will call sooner if he develops any progressive low back pain or neurologic symptoms. 2. Intractable back pain. The patient has had significant difficulty with management of his pain  despite long and short acting narcotics, and gabapentin. He has met with palliative care and will meet with Dr. [name]. We will follow this expectantly. 3.  Urinary frequency and urgency. We will submit the patient's urine for UA with C&S and intervene with antibiotics as indicated. He does have an elevated glucose from his recent blood work which could be contributing to his frequency. We will discuss this further with him once we have results from his urinary testing.     [name], PAC

## 2016-01-10 LAB — URINE CULTURE: Culture: NO GROWTH

## 2016-01-10 NOTE — Progress Notes (Signed)
Can you let hi and his wife know that he's dipping 1+ glucose in his urine, and has trace ketones- this indicates that he needs tighter control of his blood sugar by his PCP. Because there is trace blood and he has symptoms of frequency (though this could also be due to his glucose), I'd recommend a course of Bactrim DS #10, one po BID for 5 days without refills. Can you call this into his pharmacy for me? [employer]

## 2016-01-10 NOTE — Progress Notes (Signed)
Tonie- can you let he and his wife know that there was no growth. If he hasn't picked up his Bactrim, I'd suggest we cancel his prescription with the pharmacy.  Thanks, Bryson Ha

## 2016-01-11 ENCOUNTER — Telehealth: Payer: Self-pay | Admitting: Radiation Oncology

## 2016-01-11 NOTE — Telephone Encounter (Signed)
LM for pt's girlfriend to call.

## 2016-01-11 NOTE — Telephone Encounter (Signed)
I spoke with Ms. Wise and we discussed his UA and culture and that there is no need now to proceed with abx as originally thought. I did encourage her to contact his PCP due to elevated blood glucose seen previously in his labs, and dipping 1+ glucose on his UA.

## 2016-01-12 ENCOUNTER — Encounter: Payer: Self-pay | Admitting: *Deleted

## 2016-01-12 NOTE — Progress Notes (Addendum)
Spoke with Mrs. Phillip [name] about the results of the urine and his dipping 1 + glucose in his urine and trace ketones Bactrim DS Bid was ordered by Shona Simpson, PA.  Mentioned that the culture was negative an Shona Simpson, PA  wanted the antibiotic prescription cancelled.  Mrs. Harrel replied that she was aware and he would not take the antibiotic; she had called Bryson Ha to ask about the results.

## 2016-01-17 ENCOUNTER — Telehealth: Payer: Self-pay | Admitting: Oncology

## 2016-01-17 ENCOUNTER — Other Ambulatory Visit (HOSPITAL_BASED_OUTPATIENT_CLINIC_OR_DEPARTMENT_OTHER): Payer: Commercial Managed Care - HMO

## 2016-01-17 ENCOUNTER — Ambulatory Visit (HOSPITAL_BASED_OUTPATIENT_CLINIC_OR_DEPARTMENT_OTHER): Payer: Commercial Managed Care - HMO | Admitting: Nurse Practitioner

## 2016-01-17 ENCOUNTER — Telehealth: Payer: Self-pay

## 2016-01-17 VITALS — BP 133/63 | HR 66 | Temp 98.6°F | Resp 18 | Ht 70.0 in | Wt 242.9 lb

## 2016-01-17 DIAGNOSIS — G893 Neoplasm related pain (acute) (chronic): Secondary | ICD-10-CM | POA: Diagnosis not present

## 2016-01-17 DIAGNOSIS — R300 Dysuria: Secondary | ICD-10-CM

## 2016-01-17 DIAGNOSIS — C7949 Secondary malignant neoplasm of other parts of nervous system: Secondary | ICD-10-CM

## 2016-01-17 DIAGNOSIS — C649 Malignant neoplasm of unspecified kidney, except renal pelvis: Secondary | ICD-10-CM

## 2016-01-17 DIAGNOSIS — I10 Essential (primary) hypertension: Secondary | ICD-10-CM

## 2016-01-17 DIAGNOSIS — C7951 Secondary malignant neoplasm of bone: Secondary | ICD-10-CM | POA: Diagnosis not present

## 2016-01-17 LAB — CBC WITH DIFFERENTIAL/PLATELET
BASO%: 0.4 % (ref 0.0–2.0)
Basophils Absolute: 0 10*3/uL (ref 0.0–0.1)
EOS%: 14.4 % — ABNORMAL HIGH (ref 0.0–7.0)
Eosinophils Absolute: 1.1 10*3/uL — ABNORMAL HIGH (ref 0.0–0.5)
HCT: 41.1 % (ref 38.4–49.9)
HGB: 13.7 g/dL (ref 13.0–17.1)
LYMPH%: 16.7 % (ref 14.0–49.0)
MCH: 28.2 pg (ref 27.2–33.4)
MCHC: 33.3 g/dL (ref 32.0–36.0)
MCV: 84.6 fL (ref 79.3–98.0)
MONO#: 0.7 10*3/uL (ref 0.1–0.9)
MONO%: 8.9 % (ref 0.0–14.0)
NEUT#: 4.6 10*3/uL (ref 1.5–6.5)
NEUT%: 59.6 % (ref 39.0–75.0)
Platelets: 305 10*3/uL (ref 140–400)
RBC: 4.86 10*6/uL (ref 4.20–5.82)
RDW: 13.4 % (ref 11.0–14.6)
WBC: 7.7 10*3/uL (ref 4.0–10.3)
lymph#: 1.3 10*3/uL (ref 0.9–3.3)

## 2016-01-17 LAB — COMPREHENSIVE METABOLIC PANEL
ALT: 55 U/L (ref 0–55)
AST: 20 U/L (ref 5–34)
Albumin: 3.2 g/dL — ABNORMAL LOW (ref 3.5–5.0)
Alkaline Phosphatase: 90 U/L (ref 40–150)
Anion Gap: 12 mEq/L — ABNORMAL HIGH (ref 3–11)
BUN: 13.8 mg/dL (ref 7.0–26.0)
CO2: 27 mEq/L (ref 22–29)
Calcium: 10.1 mg/dL (ref 8.4–10.4)
Chloride: 98 mEq/L (ref 98–109)
Creatinine: 0.8 mg/dL (ref 0.7–1.3)
EGFR: 90 mL/min/{1.73_m2} — ABNORMAL LOW (ref >90)
Glucose: 221 mg/dl — ABNORMAL HIGH (ref 70–140)
Potassium: 4.6 mEq/L (ref 3.5–5.1)
Sodium: 136 mEq/L (ref 136–145)
Total Bilirubin: 0.37 mg/dL (ref 0.20–1.20)
Total Protein: 7.2 g/dL (ref 6.4–8.3)

## 2016-01-17 LAB — URINALYSIS, MICROSCOPIC - CHCC
Bilirubin (Urine): NEGATIVE
Blood: NEGATIVE
Glucose: 100 mg/dL
Ketones: NEGATIVE mg/dL
Leukocyte Esterase: NEGATIVE
Nitrite: NEGATIVE
Protein: NEGATIVE mg/dL
RBC / HPF: NEGATIVE (ref 0–2)
Specific Gravity, Urine: 1.01 (ref 1.003–1.035)
Urobilinogen, UR: 0.2 mg/dL (ref 0.2–1)
pH: 6 (ref 4.6–8.0)

## 2016-01-17 LAB — MAGNESIUM: Magnesium: 2.2 mg/dl (ref 1.5–2.5)

## 2016-01-17 LAB — TSH: TSH: 0.263 m(IU)/L — ABNORMAL LOW (ref 0.320–4.118)

## 2016-01-17 MED ORDER — OXYCODONE HCL ER 40 MG PO T12A
40.0000 mg | EXTENDED_RELEASE_TABLET | Freq: Two times a day (BID) | ORAL | 0 refills | Status: DC
Start: 2016-01-17 — End: 2016-01-20

## 2016-01-17 NOTE — Telephone Encounter (Signed)
Appointments scheduled per 01/17/16 los. A copy of the AVS report and appointment schedule, was given to patient, per 01/17/16 los. °

## 2016-01-17 NOTE — Progress Notes (Signed)
  [place] OFFICE PROGRESS NOTE   Diagnosis:  Renal cell carcinoma  INTERVAL HISTORY:   Phillip [name] returns as scheduled. He continues pazopanib. He denies nausea. No mouth sores. No diarrhea. No rash. He is feeling "stronger". He has increased his activity level. He feels he is "getting a handle" on the back pain. The back pain is slowly improving. He is taking OxyContin 40 mg every 12 hours. He takes 1 oxycodone tablet about every 9 hours.  Objective:  Vital signs in last 24 hours:  Blood pressure 133/63, pulse 66, temperature 98.6 F (37 C), temperature source Oral, resp. rate 18, height 5\' 10"  (1.778 m), weight 242 lb 14.4 oz (110.2 kg), SpO2 97 %.    HEENT: No thrush or ulcers. Resp: Lungs clear bilaterally. Cardio: Regular rate and rhythm. GI: Abdomen soft and nontender. No organomegaly. Vascular: No leg edema. Skin: No rash.    Lab Results:  Lab Results  Component Value Date   WBC 7.7 01/17/2016   HGB 13.7 01/17/2016   HCT 41.1 01/17/2016   MCV 84.6 01/17/2016   PLT 305 01/17/2016   NEUTROABS 4.6 01/17/2016    Imaging:  No results found.  Medications: I have reviewed the patient's current medications.  Assessment/Plan: 1. Metastatic renal cell carcinoma   L4 mass with extraosseous extension, L4 nerve compression  Biopsy of the L4 mass 11/18/2015 confirmed metastatic renal cell carcinoma, clear cell type  CTs of the chest, abdomen, and pelvis 11/18/2015-right lower lobe nodule, expansile lytic lesion at the right 11th rib/costal vertebral junction, left renal mass, expansile lesion involving the L4 vertebra, lytic lesion at the left acetabulum, and a low-attenuation liver lesion  Initiation of SRS to L4 12/02/2015, Completed 12/12/2015  Initiation of Pazopanib 12/30/2015   2. Pain secondary to #1 3. Hypertension   Disposition:Phillip [name] appears stable. He will continue pazopanib. For the back pain he will continue OxyContin with  oxycodone as needed. He was provided with a new OxyContin prescription today. He will return for a follow-up visit in approximately 3 weeks. He will contact the office in the interim with any problems.  Plan reviewed with Dr. [name].    [name] ANP/GNP-BC   01/17/2016  11:55 AM

## 2016-01-17 NOTE — Telephone Encounter (Signed)
Called pharmacy to confirm pt only picked up 30 out of 60 tabs from last OxyContin refill. Per pharmacist, pt only got 30 d/t paying with cash. [name] informed.

## 2016-01-18 LAB — URINE CULTURE: Organism ID, Bacteria: NO GROWTH

## 2016-01-18 LAB — T4: Thyroxine (T4): 8.3 ug/dL (ref 4.5–12.0)

## 2016-01-18 LAB — T3 UPTAKE
Free Thyroxine Index: 2.2 (ref 1.2–4.9)
T3 Uptake Ratio: 26 % (ref 24–39)

## 2016-01-18 LAB — T4, FREE: T4,Free(Direct): 1.37 ng/dL (ref 0.82–1.77)

## 2016-01-20 ENCOUNTER — Other Ambulatory Visit: Payer: Self-pay | Admitting: *Deleted

## 2016-01-20 ENCOUNTER — Telehealth: Payer: Self-pay | Admitting: *Deleted

## 2016-01-20 DIAGNOSIS — C649 Malignant neoplasm of unspecified kidney, except renal pelvis: Secondary | ICD-10-CM

## 2016-01-20 MED ORDER — OXYCODONE HCL ER 40 MG PO T12A
40.0000 mg | EXTENDED_RELEASE_TABLET | Freq: Two times a day (BID) | ORAL | 0 refills | Status: DC
Start: 2016-01-20 — End: 2016-03-07

## 2016-01-20 NOTE — Telephone Encounter (Signed)
Message received from [place] stating that per [facility] in [place], Oxycontin CR needs pre approval and patient will run out of medication after tomorrow mornings dose.  Call placed to [facility] to obtain prior auth information to be faxed to [facility].  Received prior auth info from [employer] and information given to [employer].  Per Phillip [name], [employer] company is closed today, so prior auth unable to be obtained.  Per Phillip [name], [facility] patient may pay out of pocket for Oxycontin CR x 4 doses to get patient through to Monday evening and obtain new script on Monday once pre approval can be obtained from patient's insurance company.  Call placed back to [facility] to inform her of above information.  Phillip [name] is very appreciative of assistance and will obtain enough Oxycontin CR to get patient through until Monday afternoon. Dr. Benay [name] informed of above.

## 2016-01-23 ENCOUNTER — Encounter: Payer: Self-pay | Admitting: Oncology

## 2016-01-23 ENCOUNTER — Telehealth: Payer: Self-pay | Admitting: *Deleted

## 2016-01-23 NOTE — Telephone Encounter (Signed)
Call from pt's S/O. Oxycontin was denied by [facility]. She paid out of pocket for prescription and is hoping the pharmacy can reimburse her if approved today.  Discussed with Shauna in managed care, she will re-submit PA to Medicare. Update: Denial received from Medicare as well, preferred drug is Fentanyl patch. Returned call to [place], no answer. Left message informing her medication was denied.

## 2016-01-23 NOTE — Progress Notes (Signed)
Phillip [name] Phillip [name] - PA Case ID: [id number] Need help? Call us at [phone]  Outcome  Deniedtoday  Questionnaire submitted. PA Case [id number] Status: Pending review. PA Case: [id number], Status: Denied. Notification: Completed.

## 2016-01-23 NOTE — Progress Notes (Signed)
Phillip [name] (Key: [id number])   The request has received a Pending outcome. Please note any additional information provided by [facility] at the bottom of your screen. You will receive a final determination electronically in CoverMyMeds and via email and fax within 24 to 72 hours.

## 2016-01-23 NOTE — Progress Notes (Signed)
Submitted PA for Oxycontin 40 mg through Cover My meds. Marked as urgent,status pending.

## 2016-01-23 NOTE — Progress Notes (Signed)
Received denial for Oxycontin from [facility] Gave denial to Tanya(RN) to follow up with patient significant other and doctor on if appeal should be done or alternate drug.

## 2016-02-06 ENCOUNTER — Telehealth: Payer: Self-pay | Admitting: Oncology

## 2016-02-06 ENCOUNTER — Other Ambulatory Visit (HOSPITAL_BASED_OUTPATIENT_CLINIC_OR_DEPARTMENT_OTHER): Payer: Commercial Managed Care - HMO

## 2016-02-06 ENCOUNTER — Ambulatory Visit (HOSPITAL_BASED_OUTPATIENT_CLINIC_OR_DEPARTMENT_OTHER): Payer: Commercial Managed Care - HMO | Admitting: Oncology

## 2016-02-06 VITALS — BP 170/67 | HR 53 | Temp 98.8°F | Resp 18 | Ht 70.0 in | Wt 247.1 lb

## 2016-02-06 DIAGNOSIS — C7951 Secondary malignant neoplasm of bone: Secondary | ICD-10-CM

## 2016-02-06 DIAGNOSIS — G893 Neoplasm related pain (acute) (chronic): Secondary | ICD-10-CM

## 2016-02-06 DIAGNOSIS — C649 Malignant neoplasm of unspecified kidney, except renal pelvis: Secondary | ICD-10-CM

## 2016-02-06 LAB — CBC WITH DIFFERENTIAL/PLATELET
BASO%: 1.2 % (ref 0.0–2.0)
Basophils Absolute: 0.1 10*3/uL (ref 0.0–0.1)
EOS%: 13.3 % — ABNORMAL HIGH (ref 0.0–7.0)
Eosinophils Absolute: 0.7 10*3/uL — ABNORMAL HIGH (ref 0.0–0.5)
HCT: 44 % (ref 38.4–49.9)
HGB: 14.1 g/dL (ref 13.0–17.1)
LYMPH%: 21.7 % (ref 14.0–49.0)
MCH: 27.5 pg (ref 27.2–33.4)
MCHC: 32.1 g/dL (ref 32.0–36.0)
MCV: 85.7 fL (ref 79.3–98.0)
MONO#: 0.6 10*3/uL (ref 0.1–0.9)
MONO%: 12.1 % (ref 0.0–14.0)
NEUT#: 2.5 10*3/uL (ref 1.5–6.5)
NEUT%: 51.7 % (ref 39.0–75.0)
Platelets: 186 10*3/uL (ref 140–400)
RBC: 5.13 10*6/uL (ref 4.20–5.82)
RDW: 16.3 % — ABNORMAL HIGH (ref 11.0–14.6)
WBC: 4.9 10*3/uL (ref 4.0–10.3)
lymph#: 1.1 10*3/uL (ref 0.9–3.3)

## 2016-02-06 LAB — COMPREHENSIVE METABOLIC PANEL
ALT: 1370 U/L (ref 0–55)
AST: 420 U/L (ref 5–34)
Albumin: 3.2 g/dL — ABNORMAL LOW (ref 3.5–5.0)
Alkaline Phosphatase: 105 U/L (ref 40–150)
Anion Gap: 11 mEq/L (ref 3–11)
BUN: 13.4 mg/dL (ref 7.0–26.0)
CO2: 25 mEq/L (ref 22–29)
Calcium: 9.6 mg/dL (ref 8.4–10.4)
Chloride: 99 mEq/L (ref 98–109)
Creatinine: 0.8 mg/dL (ref 0.7–1.3)
EGFR: >90 mL/min/{1.73_m2} (ref >90)
Glucose: 222 mg/dl — ABNORMAL HIGH (ref 70–140)
Potassium: 4.1 mEq/L (ref 3.5–5.1)
Sodium: 135 mEq/L — ABNORMAL LOW (ref 136–145)
Total Bilirubin: 0.69 mg/dL (ref 0.20–1.20)
Total Protein: 7 g/dL (ref 6.4–8.3)

## 2016-02-06 LAB — MAGNESIUM: Magnesium: 2.1 mg/dl (ref 1.5–2.5)

## 2016-02-06 NOTE — Progress Notes (Signed)
  [place] OFFICE PROGRESS NOTE   Diagnosis: Renal cell carcinoma a  INTERVAL HISTORY:   Ms. Norona returns as scheduled. He continues pazopanib. He reports improvement in his appetite. His energy level has improved. He is now getting out of the house. He has noted significant improvement in pain. He continues to have pain in the right leg. He takes OxyContin twice daily. He no longer uses oxycodone for breakthrough pain. He uses Tylenol and ibuprofen intermittently. He is taking gabapentin once daily. No rash, diarrhea, or mouth sores.  Objective:  Vital signs in last 24 hours:  Blood pressure (!) 170/67, pulse (!) 53, temperature 98.8 F (37.1 C), temperature source Oral, resp. rate 18, height 5\' 10"  (1.778 m), weight 247 lb 1.6 oz (112.1 kg), SpO2 97 %.    HEENT: No thrush or ulcers Resp: Lungs clear bilaterally Cardio: Regular rate and rhythm GI: No hepatomegaly, nontender Vascular: No leg edema Neuro: Alert and oriented  Skin: No rash   Lab Results:  Lab Results  Component Value Date   WBC 4.9 02/06/2016   HGB 14.1 02/06/2016   HCT 44.0 02/06/2016   MCV 85.7 02/06/2016   PLT 186 02/06/2016   NEUTROABS 2.5 02/06/2016   Alkaline phosphatase 105, albumin 3.2, AST 420, ALT 1370, bilirubin 0.69, creatinine 0.8  Medications: I have reviewed the patient's current medications.  Assessment/Plan: 1. Metastatic renal cell carcinoma   L4 mass with extraosseous extension, L4 nerve compression  Biopsy of the L4 mass 11/18/2015 confirmed metastatic renal cell carcinoma, clear cell type  CTs of the chest, abdomen, and pelvis 11/18/2015-right lower lobe nodule, expansile lytic lesion at the right 11th rib/costal vertebral junction, left renal mass, expansile lesion involving the L4 vertebra, lytic lesion at the left acetabulum, and a low-attenuation liver lesion  Initiation of SRS to L4 12/02/2015, Completed 12/12/2015  Initiation of  Pazopanib11/04/2015   2. Pain secondary to #1 3. Hypertension 4. Elevated transaminases 02/06/2016- Pazopanib placed on hold    Disposition:  Mr. Mathwig has an improved performance status. His pain is significantly improved. He has elevated liver enzymes today. The Pazopanib will be placed on hold. He will return for a chemistry panel on 02/10/2016. We will resume treatment with a dose reduction when the liver enzymes have normalized. He will discontinue Tylenol and gabapentin.  Mr. Kendzierski will be scheduled for an office and lab visit 02/23/2016.  We decided to hold on a restaging CT evaluation since he has been on Pazopanib for less than 2 months and there was a significant gap between the baseline CT and initiation of treatment. We will plan for a restaging CT after 3 months of therapy.  Approximately 25 minutes were spent with patient today.  Betsy Coder, MD  02/06/2016  12:01 PM

## 2016-02-06 NOTE — Addendum Note (Signed)
Addended by: Rosalio Macadamia C on: 02/06/2016 12:14 PM   Modules accepted: Orders

## 2016-02-06 NOTE — Telephone Encounter (Signed)
Appointments scheduled per 12/11 LOS. Patient given AVS report and calendars with future scheduled appointments. °

## 2016-02-10 ENCOUNTER — Other Ambulatory Visit (HOSPITAL_BASED_OUTPATIENT_CLINIC_OR_DEPARTMENT_OTHER): Payer: Commercial Managed Care - HMO

## 2016-02-10 ENCOUNTER — Telehealth: Payer: Self-pay | Admitting: *Deleted

## 2016-02-10 DIAGNOSIS — C649 Malignant neoplasm of unspecified kidney, except renal pelvis: Secondary | ICD-10-CM | POA: Diagnosis not present

## 2016-02-10 LAB — COMPREHENSIVE METABOLIC PANEL
ALT: 882 U/L (ref 0–55)
AST: 204 U/L (ref 5–34)
Albumin: 3.3 g/dL — ABNORMAL LOW (ref 3.5–5.0)
Alkaline Phosphatase: 87 U/L (ref 40–150)
Anion Gap: 10 mEq/L (ref 3–11)
BUN: 16.9 mg/dL (ref 7.0–26.0)
CO2: 29 mEq/L (ref 22–29)
Calcium: 9.5 mg/dL (ref 8.4–10.4)
Chloride: 99 mEq/L (ref 98–109)
Creatinine: 0.8 mg/dL (ref 0.7–1.3)
EGFR: 89 mL/min/{1.73_m2} — ABNORMAL LOW (ref >90)
Glucose: 214 mg/dl — ABNORMAL HIGH (ref 70–140)
Potassium: 4.6 mEq/L (ref 3.5–5.1)
Sodium: 138 mEq/L (ref 136–145)
Total Bilirubin: 0.74 mg/dL (ref 0.20–1.20)
Total Protein: 6.9 g/dL (ref 6.4–8.3)

## 2016-02-10 NOTE — Telephone Encounter (Signed)
CMET reviewed by Dr. [name]: Better, REMAIN OFF VOTRIENT. Informed pt, he voiced understanding. Will repeat lab next visit.

## 2016-02-16 ENCOUNTER — Other Ambulatory Visit: Payer: Self-pay | Admitting: Radiation Therapy

## 2016-02-16 DIAGNOSIS — C7949 Secondary malignant neoplasm of other parts of nervous system: Principal | ICD-10-CM

## 2016-02-16 DIAGNOSIS — C7931 Secondary malignant neoplasm of brain: Secondary | ICD-10-CM

## 2016-02-23 ENCOUNTER — Other Ambulatory Visit (HOSPITAL_BASED_OUTPATIENT_CLINIC_OR_DEPARTMENT_OTHER): Payer: Commercial Managed Care - HMO

## 2016-02-23 ENCOUNTER — Telehealth: Payer: Self-pay | Admitting: *Deleted

## 2016-02-23 ENCOUNTER — Encounter: Payer: Self-pay | Admitting: Oncology

## 2016-02-23 ENCOUNTER — Ambulatory Visit (HOSPITAL_BASED_OUTPATIENT_CLINIC_OR_DEPARTMENT_OTHER): Payer: Commercial Managed Care - HMO | Admitting: Oncology

## 2016-02-23 VITALS — BP 154/59 | HR 62 | Temp 98.7°F | Resp 18 | Ht 70.0 in

## 2016-02-23 DIAGNOSIS — I10 Essential (primary) hypertension: Secondary | ICD-10-CM

## 2016-02-23 DIAGNOSIS — C7951 Secondary malignant neoplasm of bone: Secondary | ICD-10-CM

## 2016-02-23 DIAGNOSIS — C649 Malignant neoplasm of unspecified kidney, except renal pelvis: Secondary | ICD-10-CM

## 2016-02-23 DIAGNOSIS — G893 Neoplasm related pain (acute) (chronic): Secondary | ICD-10-CM

## 2016-02-23 LAB — CBC WITH DIFFERENTIAL/PLATELET
BASO%: 0.5 % (ref 0.0–2.0)
Basophils Absolute: 0 10*3/uL (ref 0.0–0.1)
EOS%: 6.9 % (ref 0.0–7.0)
Eosinophils Absolute: 0.4 10*3/uL (ref 0.0–0.5)
HCT: 41.1 % (ref 38.4–49.9)
HGB: 13.3 g/dL (ref 13.0–17.1)
LYMPH%: 28.5 % (ref 14.0–49.0)
MCH: 29 pg (ref 27.2–33.4)
MCHC: 32.4 g/dL (ref 32.0–36.0)
MCV: 89.5 fL (ref 79.3–98.0)
MONO#: 0.8 10*3/uL (ref 0.1–0.9)
MONO%: 12.8 % (ref 0.0–14.0)
NEUT#: 3.1 10*3/uL (ref 1.5–6.5)
NEUT%: 51.3 % (ref 39.0–75.0)
Platelets: 154 10*3/uL (ref 140–400)
RBC: 4.59 10*6/uL (ref 4.20–5.82)
RDW: 16.9 % — ABNORMAL HIGH (ref 11.0–14.6)
WBC: 6.1 10*3/uL (ref 4.0–10.3)
lymph#: 1.7 10*3/uL (ref 0.9–3.3)

## 2016-02-23 LAB — COMPREHENSIVE METABOLIC PANEL
ALT: 156 U/L — ABNORMAL HIGH (ref 0–55)
AST: 37 U/L — ABNORMAL HIGH (ref 5–34)
Albumin: 3.7 g/dL (ref 3.5–5.0)
Alkaline Phosphatase: 66 U/L (ref 40–150)
Anion Gap: 10 mEq/L (ref 3–11)
BUN: 15.4 mg/dL (ref 7.0–26.0)
CO2: 27 mEq/L (ref 22–29)
Calcium: 10.1 mg/dL (ref 8.4–10.4)
Chloride: 101 mEq/L (ref 98–109)
Creatinine: 0.8 mg/dL (ref 0.7–1.3)
EGFR: 90 mL/min/{1.73_m2} — ABNORMAL LOW (ref >90)
Glucose: 179 mg/dl — ABNORMAL HIGH (ref 70–140)
Potassium: 4.9 mEq/L (ref 3.5–5.1)
Sodium: 138 mEq/L (ref 136–145)
Total Bilirubin: 0.56 mg/dL (ref 0.20–1.20)
Total Protein: 7.2 g/dL (ref 6.4–8.3)

## 2016-02-23 NOTE — Telephone Encounter (Signed)
-----   Message from [name], MD sent at 02/23/2016  4:20 PM EST ----- Please call patient, LIVER enzymes are better but still elevated. Hold pazopanib , office and cmet 2 weeks

## 2016-02-23 NOTE — Progress Notes (Signed)
  [place] OFFICE PROGRESS NOTE   Diagnosis: Renal cell carcinoma a  INTERVAL HISTORY:   Ms. Talbot returns as scheduled. He remains off pazopanib due to elevated liver enzymes. He reports improvement in his appetite. His energy level has improved. He is now getting out of the house. He has noted significant improvement in pain. He is switching over to oxymorphone ER 10 mg twice a day tonight or his pain management physician. He is taking gabapentin once daily. No rash, diarrhea, or mouth sores.  Objective:  Vital signs in last 24 hours:  Blood pressure (!) 154/59, pulse 62, temperature 98.7 F (37.1 C), temperature source Oral, resp. rate 18, height 5\' 10"  (1.778 m), SpO2 98 %.    HEENT: No thrush or ulcers Resp: Lungs clear bilaterally Cardio: Regular rate and rhythm GI: No hepatomegaly, nontender Vascular: No leg edema Neuro: Alert and oriented  Skin: No rash   Lab Results:  Lab Results  Component Value Date   WBC 6.1 02/23/2016   HGB 13.3 02/23/2016   HCT 41.1 02/23/2016   MCV 89.5 02/23/2016   PLT 154 02/23/2016   NEUTROABS 3.1 02/23/2016   Alkaline phosphatase 66, albumin 3.7, AST 37, ALT 156, bilirubin 0.56, creatinine 0.8  Medications: I have reviewed the patient's current medications.  Assessment/Plan: 1. Metastatic renal cell carcinoma   L4 mass with extraosseous extension, L4 nerve compression  Biopsy of the L4 mass 11/18/2015 confirmed metastatic renal cell carcinoma, clear cell type  CTs of the chest, abdomen, and pelvis 11/18/2015-right lower lobe nodule, expansile lytic lesion at the right 11th rib/costal vertebral junction, left renal mass, expansile lesion involving the L4 vertebra, lytic lesion at the left acetabulum, and a low-attenuation liver lesion  Initiation of SRS to L4 12/02/2015, Completed 12/12/2015  Initiation of Pazopanib11/04/2015   2. Pain secondary to #1 3. Hypertension 4. Elevated transaminases 02/06/2016-  Pazopanib placed on hold    Disposition:  Mr. Howle has an improved performance status. His pain is significantly improved. Liver enzymes have improved, but remains slightly elevated. The Pazopanib remains on hold. Will continue to hold this. Follow up in 2 weeks and will recheck labs at that time to determine if he can restart the Pazopanib.    We decided to hold on a restaging CT evaluation since he has been on Pazopanib for less than 2 months and there was a significant gap between the baseline CT and initiation of treatment. We will plan for a restaging CT after 3 months of therapy.   Mikey Bussing, NP  02/23/2016  11:46 AM

## 2016-02-23 NOTE — Telephone Encounter (Signed)
Called pt, instructed him to remain off pazopanib. Liver enzymes are better but still elevated. Will recheck lab in 2 weeks with office visit. He and [name] voiced understanding.

## 2016-02-24 ENCOUNTER — Telehealth: Payer: Self-pay | Admitting: Oncology

## 2016-02-24 NOTE — Telephone Encounter (Signed)
sw pt  Wife to 1/10 appt date/time per LOS

## 2016-02-27 HISTORY — PX: OTHER SURGICAL HISTORY: SHX169

## 2016-03-07 ENCOUNTER — Other Ambulatory Visit (HOSPITAL_BASED_OUTPATIENT_CLINIC_OR_DEPARTMENT_OTHER): Payer: PPO

## 2016-03-07 ENCOUNTER — Other Ambulatory Visit: Payer: Self-pay | Admitting: *Deleted

## 2016-03-07 ENCOUNTER — Ambulatory Visit (HOSPITAL_BASED_OUTPATIENT_CLINIC_OR_DEPARTMENT_OTHER): Payer: PPO | Admitting: Oncology

## 2016-03-07 ENCOUNTER — Encounter: Payer: Self-pay | Admitting: *Deleted

## 2016-03-07 ENCOUNTER — Telehealth: Payer: Self-pay | Admitting: Oncology

## 2016-03-07 VITALS — BP 147/53 | HR 60 | Temp 97.7°F | Resp 18 | Wt 256.3 lb

## 2016-03-07 DIAGNOSIS — I10 Essential (primary) hypertension: Secondary | ICD-10-CM | POA: Diagnosis not present

## 2016-03-07 DIAGNOSIS — C7949 Secondary malignant neoplasm of other parts of nervous system: Secondary | ICD-10-CM

## 2016-03-07 DIAGNOSIS — C7951 Secondary malignant neoplasm of bone: Secondary | ICD-10-CM | POA: Diagnosis not present

## 2016-03-07 DIAGNOSIS — G893 Neoplasm related pain (acute) (chronic): Secondary | ICD-10-CM

## 2016-03-07 DIAGNOSIS — C649 Malignant neoplasm of unspecified kidney, except renal pelvis: Secondary | ICD-10-CM | POA: Diagnosis not present

## 2016-03-07 LAB — CBC WITH DIFFERENTIAL/PLATELET
BASO%: 0.8 % (ref 0.0–2.0)
Basophils Absolute: 0.1 10*3/uL (ref 0.0–0.1)
EOS%: 6.2 % (ref 0.0–7.0)
Eosinophils Absolute: 0.5 10*3/uL (ref 0.0–0.5)
HCT: 41.8 % (ref 38.4–49.9)
HGB: 13.8 g/dL (ref 13.0–17.1)
LYMPH%: 25.5 % (ref 14.0–49.0)
MCH: 29 pg (ref 27.2–33.4)
MCHC: 33 g/dL (ref 32.0–36.0)
MCV: 87.9 fL (ref 79.3–98.0)
MONO#: 0.9 10*3/uL (ref 0.1–0.9)
MONO%: 11.6 % (ref 0.0–14.0)
NEUT#: 4.2 10*3/uL (ref 1.5–6.5)
NEUT%: 55.9 % (ref 39.0–75.0)
Platelets: 173 10*3/uL (ref 140–400)
RBC: 4.76 10*6/uL (ref 4.20–5.82)
RDW: 17.7 % — ABNORMAL HIGH (ref 11.0–14.6)
WBC: 7.4 10*3/uL (ref 4.0–10.3)
lymph#: 1.9 10*3/uL (ref 0.9–3.3)

## 2016-03-07 LAB — COMPREHENSIVE METABOLIC PANEL
ALT: 37 U/L (ref 0–55)
AST: 15 U/L (ref 5–34)
Albumin: 3.8 g/dL (ref 3.5–5.0)
Alkaline Phosphatase: 65 U/L (ref 40–150)
Anion Gap: 11 mEq/L (ref 3–11)
BUN: 18.3 mg/dL (ref 7.0–26.0)
CO2: 27 mEq/L (ref 22–29)
Calcium: 10.4 mg/dL (ref 8.4–10.4)
Chloride: 101 mEq/L (ref 98–109)
Creatinine: 0.9 mg/dL (ref 0.7–1.3)
EGFR: 87 mL/min/{1.73_m2} — ABNORMAL LOW (ref >90)
Glucose: 183 mg/dl — ABNORMAL HIGH (ref 70–140)
Potassium: 4.2 mEq/L (ref 3.5–5.1)
Sodium: 139 mEq/L (ref 136–145)
Total Bilirubin: 0.47 mg/dL (ref 0.20–1.20)
Total Protein: 7.2 g/dL (ref 6.4–8.3)

## 2016-03-07 NOTE — Progress Notes (Signed)
Referral for physical therapy faxed to [facility] at [phone] per order of Dr. [name]

## 2016-03-07 NOTE — Progress Notes (Signed)
  [facility] OFFICE PROGRESS NOTE   Diagnosis: Renal cell carcinoma  INTERVAL HISTORY:   Mr. Collamore returns as scheduled. The pazopanib remains on hold. The back pain continues to improve. He is now taking oxymorphone and oxycodone for pain. He is followed by Dr. [name]. He has noted a decrease in the size of his testicles. No other complaint. He was able to take a trip to the beach over the holidays.  Objective:  Vital signs in last 24 hours:  Blood pressure (!) 147/53, pulse 60, temperature 97.7 F (36.5 C), temperature source Oral, resp. rate 18, weight 256 lb 4.8 oz (116.3 kg), SpO2 100 %.   Resp: Lungs clear bilaterally Cardio: Regular rate and rhythm GI: No hepatomegaly, nontender, no mass Vascular: No leg edema GU: Bilateral testicular enlargement-soft    Lab Results:  Lab Results  Component Value Date   WBC 7.4 03/07/2016   HGB 13.8 03/07/2016   HCT 41.8 03/07/2016   MCV 87.9 03/07/2016   PLT 173 03/07/2016   NEUTROABS 4.2 03/07/2016   AST 15, AST 37, bilirubin 0.47  Medications: I have reviewed the patient's current medications.  Assessment/Plan: 1. Metastatic renal cell carcinoma   L4 mass with extraosseous extension, L4 nerve compression  Biopsy of the L4 mass 11/18/2015 confirmed metastatic renal cell carcinoma, clear cell type  CTs of the chest, abdomen, and pelvis 11/18/2015-right lower lobe nodule, expansile lytic lesion at the right 11th rib/costal vertebral junction, left renal mass, expansile lesion involving the L4 vertebra, lytic lesion at the left acetabulum, and a low-attenuation liver lesion  Initiation of SRS to L4 12/02/2015, Completed 12/12/2015  Initiation of Pazopanib11/04/2015  Pazopanib placed on hold 02/06/2016 secondary to elevated liver enzymes  Pazopanib resumed 03/07/2016 at a dose of 400 mg daily   2. Pain secondary to #1 3. Hypertension 4. Elevated transaminases 02/06/2016- Pazopanib placed on  hold  Liver enzymes normal 03/07/2016  Disposition:  [name] appears well. The liver enzymes have normalized. He will resume pazopanib at a dose of 400 mg daily. He understands the chance of developing elevated liver when he restarts pazapanib. He will return for a chemistry panel on 03/14/2016 and 03/19/2016. He will be scheduled for an office visit on 03/28/2016.  We will plan for a restaging CT evaluation in approximately 2 months.  He will begin an outpatient physical therapy program.  He will continue the current narcotic regimen for treatment of cancer-related pain.  Approximately 25 minutes were spent with the patient today. The majority of the time was used for counseling and coordination of care.  Betsy Coder, MD  03/07/2016  10:45 AM

## 2016-03-07 NOTE — Addendum Note (Signed)
Addended by: San Morelle on: 03/07/2016 11:59 AM   Modules accepted: Orders

## 2016-03-07 NOTE — Telephone Encounter (Signed)
Appointments scheduled per 1/10 LOS. Patient given AVS report and calendars with future scheduled appointments. °

## 2016-03-14 ENCOUNTER — Telehealth: Payer: Self-pay | Admitting: Oncology

## 2016-03-14 ENCOUNTER — Other Ambulatory Visit: Payer: Self-pay

## 2016-03-14 ENCOUNTER — Telehealth: Payer: Self-pay | Admitting: *Deleted

## 2016-03-14 NOTE — Telephone Encounter (Signed)
Telephone call returned to patient. He continues to take the Votrient as prescribed. He will be in on Monday for his already scheduled lab appointment.

## 2016-03-14 NOTE — Telephone Encounter (Signed)
Patient called to cancel his lab appointment due to inclement weather and need to reschedule Call back number [phone]

## 2016-03-15 ENCOUNTER — Ambulatory Visit
Admission: RE | Admit: 2016-03-15 | Discharge: 2016-03-15 | Disposition: A | Discharge status: Home / Self Care | Payer: Self-pay | Source: Ambulatory Visit | Attending: Radiation Oncology | Admitting: Radiation Oncology

## 2016-03-15 DIAGNOSIS — C7931 Secondary malignant neoplasm of brain: Secondary | ICD-10-CM

## 2016-03-15 DIAGNOSIS — C7949 Secondary malignant neoplasm of other parts of nervous system: Principal | ICD-10-CM

## 2016-03-16 ENCOUNTER — Other Ambulatory Visit: Payer: Self-pay | Admitting: Radiation Oncology

## 2016-03-16 DIAGNOSIS — C7952 Secondary malignant neoplasm of bone marrow: Principal | ICD-10-CM

## 2016-03-16 DIAGNOSIS — C7951 Secondary malignant neoplasm of bone: Secondary | ICD-10-CM

## 2016-03-19 ENCOUNTER — Ambulatory Visit: Payer: Self-pay | Admitting: Radiation Oncology

## 2016-03-19 ENCOUNTER — Telehealth: Payer: Self-pay | Admitting: *Deleted

## 2016-03-19 ENCOUNTER — Other Ambulatory Visit (HOSPITAL_BASED_OUTPATIENT_CLINIC_OR_DEPARTMENT_OTHER): Payer: PPO

## 2016-03-19 DIAGNOSIS — C649 Malignant neoplasm of unspecified kidney, except renal pelvis: Secondary | ICD-10-CM

## 2016-03-19 DIAGNOSIS — R531 Weakness: Secondary | ICD-10-CM | POA: Diagnosis not present

## 2016-03-19 DIAGNOSIS — M6281 Muscle weakness (generalized): Secondary | ICD-10-CM | POA: Diagnosis not present

## 2016-03-19 LAB — COMPREHENSIVE METABOLIC PANEL
ALT: 307 U/L (ref 0–55)
AST: 81 U/L — ABNORMAL HIGH (ref 5–34)
Albumin: 3.6 g/dL (ref 3.5–5.0)
Alkaline Phosphatase: 76 U/L (ref 40–150)
Anion Gap: 9 mEq/L (ref 3–11)
BUN: 15.4 mg/dL (ref 7.0–26.0)
CO2: 30 mEq/L — ABNORMAL HIGH (ref 22–29)
Calcium: 10 mg/dL (ref 8.4–10.4)
Chloride: 99 mEq/L (ref 98–109)
Creatinine: 0.9 mg/dL (ref 0.7–1.3)
EGFR: 87 mL/min/{1.73_m2} — ABNORMAL LOW (ref >90)
Glucose: 214 mg/dl — ABNORMAL HIGH (ref 70–140)
Potassium: 4.4 mEq/L (ref 3.5–5.1)
Sodium: 138 mEq/L (ref 136–145)
Total Bilirubin: 0.83 mg/dL (ref 0.20–1.20)
Total Protein: 6.8 g/dL (ref 6.4–8.3)

## 2016-03-19 NOTE — Telephone Encounter (Signed)
Called pt with instructions to stop pazopanib due to elevated liver enzymes. He voiced understanding. Next appt confirmed.

## 2016-03-19 NOTE — Telephone Encounter (Signed)
Dr. [name] reviewd CMET- elevated ALT: HOLD Pazopanib. Follow up as scheduled. Left message on voicemail instructing pt to stop Pazopanib. Requested he call office to confirm.

## 2016-03-21 ENCOUNTER — Ambulatory Visit
Admission: RE | Admit: 2016-03-21 | Discharge: 2016-03-21 | Disposition: A | Discharge status: Home / Self Care | Payer: PPO | Source: Ambulatory Visit | Attending: Radiation Oncology | Admitting: Radiation Oncology

## 2016-03-21 ENCOUNTER — Other Ambulatory Visit: Payer: Self-pay

## 2016-03-21 DIAGNOSIS — C7951 Secondary malignant neoplasm of bone: Secondary | ICD-10-CM

## 2016-03-21 DIAGNOSIS — C720 Malignant neoplasm of spinal cord: Secondary | ICD-10-CM | POA: Diagnosis not present

## 2016-03-21 DIAGNOSIS — C649 Malignant neoplasm of unspecified kidney, except renal pelvis: Secondary | ICD-10-CM | POA: Diagnosis not present

## 2016-03-21 DIAGNOSIS — C7952 Secondary malignant neoplasm of bone marrow: Principal | ICD-10-CM

## 2016-03-21 MED ORDER — GADOBENATE DIMEGLUMINE 529 MG/ML IV SOLN
20.0000 mL | Freq: Once | INTRAVENOUS | Status: AC | PRN
  Administered 2016-03-21: 20 mL via INTRAVENOUS

## 2016-03-23 ENCOUNTER — Other Ambulatory Visit: Payer: Self-pay | Admitting: *Deleted

## 2016-03-23 ENCOUNTER — Telehealth: Payer: Self-pay | Admitting: *Deleted

## 2016-03-23 ENCOUNTER — Telehealth: Payer: Self-pay | Admitting: Oncology

## 2016-03-23 ENCOUNTER — Encounter: Payer: Self-pay | Admitting: Nurse Practitioner

## 2016-03-23 ENCOUNTER — Ambulatory Visit (HOSPITAL_BASED_OUTPATIENT_CLINIC_OR_DEPARTMENT_OTHER): Payer: PPO

## 2016-03-23 ENCOUNTER — Other Ambulatory Visit (HOSPITAL_COMMUNITY)
Admission: RE | Admit: 2016-03-23 | Discharge: 2016-03-23 | Disposition: A | Discharge status: Home / Self Care | Payer: PPO | Source: Ambulatory Visit | Attending: Oncology | Admitting: Oncology

## 2016-03-23 ENCOUNTER — Ambulatory Visit (HOSPITAL_BASED_OUTPATIENT_CLINIC_OR_DEPARTMENT_OTHER): Payer: PPO | Admitting: Nurse Practitioner

## 2016-03-23 VITALS — BP 130/53 | HR 52 | Temp 98.8°F | Resp 17 | Ht 70.0 in | Wt 254.3 lb

## 2016-03-23 DIAGNOSIS — C7949 Secondary malignant neoplasm of other parts of nervous system: Secondary | ICD-10-CM

## 2016-03-23 DIAGNOSIS — C649 Malignant neoplasm of unspecified kidney, except renal pelvis: Secondary | ICD-10-CM

## 2016-03-23 DIAGNOSIS — M6281 Muscle weakness (generalized): Secondary | ICD-10-CM | POA: Diagnosis not present

## 2016-03-23 DIAGNOSIS — C659 Malignant neoplasm of unspecified renal pelvis: Secondary | ICD-10-CM | POA: Diagnosis not present

## 2016-03-23 DIAGNOSIS — C7951 Secondary malignant neoplasm of bone: Secondary | ICD-10-CM | POA: Diagnosis not present

## 2016-03-23 DIAGNOSIS — R531 Weakness: Secondary | ICD-10-CM | POA: Diagnosis not present

## 2016-03-23 LAB — COMPREHENSIVE METABOLIC PANEL
ALT: 187 U/L — ABNORMAL HIGH (ref 0–55)
AST: 46 U/L — ABNORMAL HIGH (ref 5–34)
Albumin: 4 g/dL (ref 3.5–5.0)
Alkaline Phosphatase: 83 U/L (ref 40–150)
Anion Gap: 10 mEq/L (ref 3–11)
BUN: 15.7 mg/dL (ref 7.0–26.0)
CO2: 28 mEq/L (ref 22–29)
Calcium: 10.1 mg/dL (ref 8.4–10.4)
Chloride: 101 mEq/L (ref 98–109)
Creatinine: 0.8 mg/dL (ref 0.7–1.3)
EGFR: 89 mL/min/{1.73_m2} — ABNORMAL LOW (ref >90)
Glucose: 162 mg/dl — ABNORMAL HIGH (ref 70–140)
Potassium: 4.7 mEq/L (ref 3.5–5.1)
Sodium: 138 mEq/L (ref 136–145)
Total Bilirubin: 0.77 mg/dL (ref 0.20–1.20)
Total Protein: 7.1 g/dL (ref 6.4–8.3)

## 2016-03-23 LAB — CBC WITH DIFFERENTIAL/PLATELET
BASO%: 0.9 % (ref 0.0–2.0)
Basophils Absolute: 0.1 10*3/uL (ref 0.0–0.1)
EOS%: 5.6 % (ref 0.0–7.0)
Eosinophils Absolute: 0.3 10*3/uL (ref 0.0–0.5)
HCT: 45.1 % (ref 38.4–49.9)
HGB: 15 g/dL (ref 13.0–17.1)
LYMPH%: 23.5 % (ref 14.0–49.0)
MCH: 29.5 pg (ref 27.2–33.4)
MCHC: 33.4 g/dL (ref 32.0–36.0)
MCV: 88.4 fL (ref 79.3–98.0)
MONO#: 0.8 10*3/uL (ref 0.1–0.9)
MONO%: 12.9 % (ref 0.0–14.0)
NEUT#: 3.4 10*3/uL (ref 1.5–6.5)
NEUT%: 57.1 % (ref 39.0–75.0)
Platelets: 146 10*3/uL (ref 140–400)
RBC: 5.1 10*6/uL (ref 4.20–5.82)
RDW: 17.3 % — ABNORMAL HIGH (ref 11.0–14.6)
WBC: 5.9 10*3/uL (ref 4.0–10.3)
lymph#: 1.4 10*3/uL (ref 0.9–3.3)

## 2016-03-23 LAB — AMMONIA: Ammonia: 22 umol/L (ref 9–35)

## 2016-03-23 NOTE — Telephone Encounter (Signed)
lvm to inform pt of r/s lab/ov appt 2/7 at 1030 am. Left information of CT being scheduled by central radiology and pt can pick up contrast on 1/31 appt in RadOnc

## 2016-03-23 NOTE — Progress Notes (Signed)
Main c/o today is new symptom of difficulty with finding words when speaking. No expressive aphasia or receptive aphasia noted. No facial droop. Tongue midline. PERRL. Equal grip strength.

## 2016-03-23 NOTE — Telephone Encounter (Signed)
Gave patient avs report and appointments for February. Central radiology will call re scan. Los not completed for 1/26 visit - see 1/26 sch msg.

## 2016-03-23 NOTE — Telephone Encounter (Signed)
Pt's wife states he is having mental slowness. Noticed it last night and is a little worse this morning, having a hard time processing words.  Will come in for labs and see Dr Harvie Heck, NP

## 2016-03-23 NOTE — Progress Notes (Signed)
SYMPTOM MANAGEMENT CLINIC    Chief Complaint: Fatigue  HPI:  Phillip [name] 71 y.o. male diagnosed with renal cell cancer.  Has been undergoing Votrient oral chemotherapy agent; which has been held due to transaminitis.    No history exists.    Review of Systems  Constitutional: Positive for malaise/fatigue.  Neurological: Positive for speech change.  All other systems reviewed and are negative.   Past Medical History:  Diagnosis Date  . Hypertension     Past Surgical History:  Procedure Laterality Date  . IR GENERIC HISTORICAL  11/18/2015   IR FLUORO GUIDED NEEDLE PLC ASPIRATION/INJECTION LOC 11/18/2015 [name], MD [facility]    has Lumbar spine metastasis and Renal cell carcinoma ([place]) on his problem list.    has No Known Allergies.  Allergies as of 03/23/2016   No Known Allergies     Medication List       Accurate as of 03/23/16  7:44 PM. Always use your most recent med list.          docusate sodium 100 MG capsule Commonly known as:  COLACE Take 100 mg by mouth 2 (two) times daily.   LORazepam 0.5 MG tablet Commonly known as:  ATIVAN 1 tablet po 30 minutes prior to radiation or MRI   losartan-hydrochlorothiazide 100-12.5 MG tablet Commonly known as:  HYZAAR Take 1 tablet by mouth daily.   oxycodone 5 MG capsule Commonly known as:  OXY-IR Take 5 mg by mouth every 4 (four) hours as needed.   oxymorphone 10 MG 12 hr tablet Commonly known as:  OPANA ER Take 10 mg by mouth every 12 (twelve) hours.   polyethylene glycol packet Commonly known as:  MIRALAX / GLYCOLAX Take 17 g by mouth daily.        PHYSICAL EXAMINATION  Oncology Vitals 03/23/2016 03/07/2016  Height 178 cm -  Weight 115.35 kg 116.257 kg  Weight (lbs) 254 lbs 5 oz 256 lbs 5 oz  BMI (kg/m2) 36.49 kg/m2 36.78 kg/m2  Temp 98.8 97.7  Pulse 52 60  Resp 17 18  SpO2 100 100  BSA (m2) 2.39 m2 2.4 m2   BP Readings from Last 2 Encounters:  03/23/16 (!) 130/53  03/07/16  (!) 147/53    Physical Exam  Constitutional: He is oriented to person, place, and time and well-developed, well-nourished, and in no distress.  HENT:  Head: Normocephalic and atraumatic.  Eyes: Conjunctivae and EOM are normal. Pupils are equal, round, and reactive to light.  Neck: Normal range of motion.  Pulmonary/Chest: Effort normal. No respiratory distress.  Musculoskeletal: Normal range of motion.  Neurological: He is alert and oriented to person, place, and time. Gait normal.  Skin: Skin is warm and dry.  Psychiatric: Affect normal.  Nursing note and vitals reviewed.   LABORATORY DATA:. Hospital Outpatient Visit on 03/23/2016  Component Date Value Ref Range Status  . Ammonia 03/23/2016 22  9 - 35 umol/L Final  Appointment on 03/23/2016  Component Date Value Ref Range Status  . Sodium 03/23/2016 138  136 - 145 mEq/L Final  . Potassium 03/23/2016 4.7  3.5 - 5.1 mEq/L Final  . Chloride 03/23/2016 101  98 - 109 mEq/L Final  . CO2 03/23/2016 28  22 - 29 mEq/L Final  . Glucose 03/23/2016 162* 70 - 140 mg/dl Final  . BUN 03/23/2016 15.7  7.0 - 26.0 mg/dL Final  . Creatinine 03/23/2016 0.8  0.7 - 1.3 mg/dL Final  . Total Bilirubin 03/23/2016 0.77  0.20 -  1.20 mg/dL Final  . Alkaline Phosphatase 03/23/2016 83  40 - 150 U/L Final  . AST 03/23/2016 46* 5 - 34 U/L Final  . ALT 03/23/2016 187* 0 - 55 U/L Final  . Total Protein 03/23/2016 7.1  6.4 - 8.3 g/dL Final  . Albumin 03/23/2016 4.0  3.5 - 5.0 g/dL Final  . Calcium 03/23/2016 10.1  8.4 - 10.4 mg/dL Final  . Anion Gap 03/23/2016 10  3 - 11 mEq/L Final  . EGFR 03/23/2016 89* >90 ml/min/1.73 m2 Final  . WBC 03/23/2016 5.9  4.0 - 10.3 10e3/uL Final  . NEUT# 03/23/2016 3.4  1.5 - 6.5 10e3/uL Final  . HGB 03/23/2016 15.0  13.0 - 17.1 g/dL Final  . HCT 03/23/2016 45.1  38.4 - 49.9 % Final  . Platelets 03/23/2016 146  140 - 400 10e3/uL Final  . MCV 03/23/2016 88.4  79.3 - 98.0 fL Final  . MCH 03/23/2016 29.5  27.2 - 33.4 pg Final    . MCHC 03/23/2016 33.4  32.0 - 36.0 g/dL Final  . RBC 03/23/2016 5.10  4.20 - 5.82 10e6/uL Final  . RDW 03/23/2016 17.3* 11.0 - 14.6 % Final  . lymph# 03/23/2016 1.4  0.9 - 3.3 10e3/uL Final  . MONO# 03/23/2016 0.8  0.1 - 0.9 10e3/uL Final  . Eosinophils Absolute 03/23/2016 0.3  0.0 - 0.5 10e3/uL Final  . Basophils Absolute 03/23/2016 0.1  0.0 - 0.1 10e3/uL Final  . NEUT% 03/23/2016 57.1  39.0 - 75.0 % Final  . LYMPH% 03/23/2016 23.5  14.0 - 49.0 % Final  . MONO% 03/23/2016 12.9  0.0 - 14.0 % Final  . EOS% 03/23/2016 5.6  0.0 - 7.0 % Final  . BASO% 03/23/2016 0.9  0.0 - 2.0 % Final    RADIOGRAPHIC STUDIES: Mr Lumbar Spine W Wo Contrast  Result Date: 03/22/2016 CLINICAL DATA:  Metastatic renal cell carcinoma. SRS treatment 11/2015 EXAM: MRI LUMBAR SPINE WITHOUT AND WITH CONTRAST TECHNIQUE: Multiplanar and multiecho pulse sequences of the lumbar spine were obtained without and with intravenous contrast. CONTRAST:  72m MULTIHANCE GADOBENATE DIMEGLUMINE 529 MG/ML IV SOLN COMPARISON:  11/25/2015 FINDINGS: Segmentation:  Standard. Alignment:  Physiologic. Vertebrae: Large metastasis in the right pedicle, posterior body, transverse process, and articular processes of L4 is re- identified. After radiotherapy the mass has more T2 hyperintense signal and is centrally nonenhancing/necrotic, and has decrease in size. Now, the areas of tumoral enhancement essentially respect bone margins. Previously bulky epidural tumor with thecal sac compression is significantly improved, with thecal sac CSF now visible. There is still soft tissue and dural thickening along the posterior and right aspect of the canal related to treated tumor. There is still tumoral obliteration of the right L4-5 foramen. Bony expansion from tumor at the right superior L4 articular process continues to narrow the right L3-4 foramen. At the level of L4 are enhancing right-sided nerve roots, including right L4, which could be from compression  (previously obscured) or from radiotherapy. No new metastasis is seen. Prominent fatty marrow from L3 inferiorly attributed to radiation portal. Conus medullaris: Extends to the L1 level, negative when accounting for artifact. Paraspinal and other soft tissues: Enhancement and edema in the intrinsic back muscles, also seen on the right previously. Some of this is attributed to treatment change. 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 L4-5 stenosis described above. L5-S1 disc narrowing and bulging with right L5 impingement, chronic.  IMPRESSION: 1. Positive treatment response of the L4 metastasis. Specifically, there is extensive new central necrosis and the extra osseous tumor has significantly diminished, with improved thecal sac patency. 2. Nerve root enhancement at L4 which could be related to treatment or compression. 3. No new metastatic disease identified. Electronically Signed   By: Monte Fantasia M.D.   On: 03/22/2016 06:54    ASSESSMENT/PLAN:    Renal cell carcinoma [facility]) Patient has been holding his oral Votrient chemotherapy agent since his liver enzymes have been elevated.  He reports to the [place] today with complaint of increased tiredness and questionable issues with word searching.  Patient also states that he feels that he may very well be slightly depressed as well.  Patient denies any nausea, vomiting, diarrhea, or constipation.  He denies any infection symptoms.  He denies any recent fevers or chills.  He continues to take his pain medication as directed.  Exam today reveals patient essentially well; but there is some questionable slowness to answer questions.  Discussion with both the patient and his wife regarding the possibility of initiating a low-dose antidepressant; but patient stated he would like to hold off on starting any other new medications  at this time.  Labs obtained today reveal AST has improved from 81 down to 46 and ALT has improved from 307 down to 87.  Dr. [name] in to examine the patient as well; is recommended that patient continue to hold the Votrient oral chemotherapy agent for the time being.  Also, patient underwent a restaging lumbar MRI earlier this week.  MRI results revealed improvement in lumbar spine.  Dr. [name] has recommended that patient undergo a restaging CT with contrast of the chest/abdomen/pelvis; as well as a head CT with and without contrast.  The plan is to cancel all appointments for Dr. [name] that were previously scheduled for 03/28/2016.  Will obtain the restaging scans on Monday, 04/02/2016.  Will schedule follow-up with Dr. [name] on Wednesday, 04/04/2016.  In the meantime-patient was advised to call/return or go directly to the emergency department for any worsening symptoms whatsoever.   Patient stated understanding of all instructions; and was in agreement with this plan of care. The patient knows to call the clinic with any problems, questions or concerns.   Total time spent with patient was 25 minutes;  with greater than 75 percent of that time spent in face to face counseling regarding patient's symptoms,  and coordination of care and follow up.  Disclaimer:This dictation was prepared with Dragon/digital dictation along with [employer]. Any transcriptional errors that result from this process are unintentional.  Drue Second, NP 03/23/2016   This was a shared visit with Drue Second.  The lumbar MRI reveals improvement in the L4 metastasis.  He will be scheduled for restaging CTs.  Pazopanib will be discontinued.  We will decide on another TKI vs immunotherapy after the CTs.  Julieanne Manson, MD

## 2016-03-23 NOTE — Assessment & Plan Note (Signed)
Patient has been holding his oral Votrient chemotherapy agent since his liver enzymes have been elevated.  He reports to the [facility] today with complaint of increased tiredness and questionable issues with word searching.  Patient also states that he feels that he may very well be slightly depressed as well.  Patient denies any nausea, vomiting, diarrhea, or constipation.  He denies any infection symptoms.  He denies any recent fevers or chills.  He continues to take his pain medication as directed.  Exam today reveals patient essentially well; but there is some questionable slowness to answer questions.  Discussion with both the patient and his wife regarding the possibility of initiating a low-dose antidepressant; but patient stated he would like to hold off on starting any other new medications at this time.  Labs obtained today reveal AST has improved from 81 down to 46 and ALT has improved from 307 down to 87.  Dr. [name] in to examine the patient as well; is recommended that patient continue to hold the Votrient oral chemotherapy agent for the time being.  Also, patient underwent a restaging lumbar MRI earlier this week.  MRI results revealed improvement in lumbar spine.  Dr. [name] has recommended that patient undergo a restaging CT with contrast of the chest/abdomen/pelvis; as well as a head CT with and without contrast.  The plan is to cancel all appointments for Dr. [name] that were previously scheduled for 03/28/2016.  Will obtain the restaging scans on Monday, 04/02/2016.  Will schedule follow-up with Dr. [name] on Wednesday, 04/04/2016.  In the meantime-patient was advised to call/return or go directly to the emergency department for any worsening symptoms whatsoever.

## 2016-03-26 DIAGNOSIS — M6281 Muscle weakness (generalized): Secondary | ICD-10-CM | POA: Diagnosis not present

## 2016-03-26 DIAGNOSIS — R531 Weakness: Secondary | ICD-10-CM | POA: Diagnosis not present

## 2016-03-28 ENCOUNTER — Other Ambulatory Visit: Payer: Self-pay

## 2016-03-28 ENCOUNTER — Ambulatory Visit
Admission: RE | Admit: 2016-03-28 | Discharge: 2016-03-28 | Disposition: A | Discharge status: Home / Self Care | Payer: PPO | Source: Ambulatory Visit | Attending: Radiation Oncology | Admitting: Radiation Oncology

## 2016-03-28 ENCOUNTER — Ambulatory Visit: Payer: Self-pay | Admitting: Nurse Practitioner

## 2016-03-28 DIAGNOSIS — C7951 Secondary malignant neoplasm of bone: Secondary | ICD-10-CM | POA: Diagnosis not present

## 2016-03-28 DIAGNOSIS — G893 Neoplasm related pain (acute) (chronic): Secondary | ICD-10-CM | POA: Diagnosis not present

## 2016-03-28 DIAGNOSIS — C7949 Secondary malignant neoplasm of other parts of nervous system: Secondary | ICD-10-CM

## 2016-03-28 DIAGNOSIS — M545 Low back pain: Secondary | ICD-10-CM | POA: Diagnosis not present

## 2016-03-28 DIAGNOSIS — M5416 Radiculopathy, lumbar region: Secondary | ICD-10-CM | POA: Diagnosis not present

## 2016-03-28 DIAGNOSIS — Z85528 Personal history of other malignant neoplasm of kidney: Secondary | ICD-10-CM | POA: Diagnosis not present

## 2016-03-28 DIAGNOSIS — Z08 Encounter for follow-up examination after completed treatment for malignant neoplasm: Secondary | ICD-10-CM | POA: Diagnosis not present

## 2016-03-28 NOTE — Progress Notes (Signed)
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  Interval Since Last Radiation:  3.5 months   12/02/2015 to 12/12/2015 SBRT Treatment: The L4 Right spinal was treated to 50 Gy in 5 fractions at 10 Gy per fraction.  Narrative: In summary this is a pleasant 71 y.o. gentleman with a history of metastatic renal cell carcinoma of the right kidney with metastatic disease to the lumbar spine. He received SRS treatment in the fall of 2017. He comes today for his first post treatment surveillance MRI scan which was performed on 03/21/16 which revealed regression of his tumor, but persistent compromise of the neural foramen on the right. He has been having sciatica pain for years prior to his diagnosis.   On review of systems, the patient reports that his pain is doing well overall with score of 1/10 today after seeing Dr. Brayton Mars.  He denies any chest pain, shortness of breath, cough, fevers, chills, night sweats, unintended weight changes. He denies any new musculoskeletal or joint aches or pains, new skin lesions or concerns. A complete review of systems is obtained and is otherwise negative.   Past Medical History:  Past Medical History:  Diagnosis Date  . Hypertension     Past Surgical History: Past Surgical History:  Procedure Laterality Date  . IR GENERIC HISTORICAL  11/18/2015   IR FLUORO GUIDED NEEDLE PLC ASPIRATION/INJECTION LOC 11/18/2015 [name], MD [facility]    Social History:  Social History   Social History  . Marital status: Single    Spouse name: N/A  . Number of children: N/A  . Years of education: N/A   Occupational History  . Not on file.   Social History Main Topics  . Smoking status:  Never Smoker  . Smokeless tobacco: Never Used  . Alcohol use 0.6 oz/week    1 Glasses of wine per week  . Drug use: Yes    Types: Marijuana  . Sexual activity: Not Currently   Other Topics Concern  . Not on file   Social History Narrative  . No narrative on file    Family History: Family History  Problem Relation Age of Onset  . Cancer Grandchild     lymphoma    ALLERGIES:  has No Known Allergies.  Meds: Current Outpatient Prescriptions  Medication Sig Dispense Refill  . docusate sodium (COLACE) 100 MG capsule Take 100 mg by mouth 2 (two) times daily.    [name] LORazepam (ATIVAN) 0.5 MG tablet 1 tablet po 30 minutes prior to radiation or MRI 30 tablet 0  . losartan-hydrochlorothiazide (HYZAAR) 100-12.5 MG tablet Take 1 tablet by mouth daily.     [name] oxycodone (OXY-IR) 5 MG capsule Take 5 mg by mouth every 4 (four) hours as needed.    [name] oxymorphone (OPANA ER) 10 MG 12 hr tablet Take 10 mg by mouth every 12 (twelve) hours.    . polyethylene glycol (MIRALAX / GLYCOLAX) packet Take 17 g by mouth daily.     No current facility-administered medications for this encounter.     Physical Findings:  vitals were not taken for this visit. In general this is a well appearing caucasian male in no acute distress. He's alert and oriented x4  and appropriate throughout the examination. Cardiopulmonary assessment is negative for acute distress and he exhibits normal effort.   Gait is steady and no leg weakness is noted on one leg dips.  He localizes pain to the right gluteal area and anterior thigh  Lab Findings: Lab Results  Component Value Date   WBC 5.9 03/23/2016   HGB 15.0 03/23/2016   HCT 45.1 03/23/2016   MCV 88.4 03/23/2016   PLT 146 03/23/2016     Radiographic Findings: Mr Lumbar Spine W Wo Contrast  Result Date: 03/22/2016 CLINICAL DATA:  Metastatic renal cell carcinoma. SRS treatment 11/2015 EXAM: MRI LUMBAR SPINE WITHOUT AND WITH CONTRAST TECHNIQUE: Multiplanar and  multiecho pulse sequences of the lumbar spine were obtained without and with intravenous contrast. CONTRAST:  6mL MULTIHANCE GADOBENATE DIMEGLUMINE 529 MG/ML IV SOLN COMPARISON:  11/25/2015 FINDINGS: Segmentation:  Standard. Alignment:  Physiologic. Vertebrae: Large metastasis in the right pedicle, posterior body, transverse process, and articular processes of L4 is re- identified. After radiotherapy the mass has more T2 hyperintense signal and is centrally nonenhancing/necrotic, and has decrease in size. Now, the areas of tumoral enhancement essentially respect bone margins. Previously bulky epidural tumor with thecal sac compression is significantly improved, with thecal sac CSF now visible. There is still soft tissue and dural thickening along the posterior and right aspect of the canal related to treated tumor. There is still tumoral obliteration of the right L4-5 foramen. Bony expansion from tumor at the right superior L4 articular process continues to narrow the right L3-4 foramen. At the level of L4 are enhancing right-sided nerve roots, including right L4, which could be from compression (previously obscured) or from radiotherapy. No new metastasis is seen. Prominent fatty marrow from L3 inferiorly attributed to radiation portal. Conus medullaris: Extends to the L1 level, negative when accounting for artifact. Paraspinal and other soft tissues: Enhancement and edema in the intrinsic back muscles, also seen on the right previously. Some of this is attributed to treatment change. 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 L4-5 stenosis described above. L5-S1 disc narrowing and bulging with right L5 impingement, chronic. IMPRESSION: 1. Positive treatment response of the L4 metastasis. Specifically, there is extensive new central necrosis and the extra osseous tumor has significantly  diminished, with improved thecal sac patency. 2. Nerve root enhancement at L4 which could be related to treatment or compression. 3. No new metastatic disease identified. Electronically Signed   By: Monte Fantasia M.D.   On: 03/22/2016 06:54    Impression/Plan: 1. 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  2. Back pain is improving  Repeat lumbar MRI in 3 months and re-image neuro-axis annually  ------------------------------------------------   Tyler Pita, MD [place] Director and Director of Stereotactic Radiosurgery Direct Dial: [phone]  Fax: [phone] [email or web address]  Skype  [email or web address]   This document serves as a record of services personally performed by Tyler Pita, MD and Shona Simpson, PAC. It was created on their behalf by Arlyce Harman, a trained medical scribe. The creation of this record is based on the scribe's personal observations and the provider's statements to them. This document has been checked and approved by the attending provider.

## 2016-03-30 DIAGNOSIS — R531 Weakness: Secondary | ICD-10-CM | POA: Diagnosis not present

## 2016-03-30 DIAGNOSIS — M6281 Muscle weakness (generalized): Secondary | ICD-10-CM | POA: Diagnosis not present

## 2016-04-02 ENCOUNTER — Other Ambulatory Visit: Payer: Self-pay | Admitting: Nurse Practitioner

## 2016-04-02 ENCOUNTER — Ambulatory Visit (HOSPITAL_COMMUNITY)
Admission: RE | Admit: 2016-04-02 | Discharge: 2016-04-02 | Disposition: A | Discharge status: Home / Self Care | Payer: PPO | Source: Ambulatory Visit | Attending: Nurse Practitioner | Admitting: Nurse Practitioner

## 2016-04-02 ENCOUNTER — Encounter (HOSPITAL_COMMUNITY): Payer: Self-pay

## 2016-04-02 DIAGNOSIS — C649 Malignant neoplasm of unspecified kidney, except renal pelvis: Secondary | ICD-10-CM

## 2016-04-02 DIAGNOSIS — R590 Localized enlarged lymph nodes: Secondary | ICD-10-CM | POA: Diagnosis not present

## 2016-04-02 DIAGNOSIS — I700 Atherosclerosis of aorta: Secondary | ICD-10-CM | POA: Insufficient documentation

## 2016-04-02 DIAGNOSIS — D7389 Other diseases of spleen: Secondary | ICD-10-CM | POA: Diagnosis not present

## 2016-04-02 DIAGNOSIS — C7949 Secondary malignant neoplasm of other parts of nervous system: Secondary | ICD-10-CM

## 2016-04-02 DIAGNOSIS — C7951 Secondary malignant neoplasm of bone: Secondary | ICD-10-CM | POA: Diagnosis not present

## 2016-04-02 DIAGNOSIS — R918 Other nonspecific abnormal finding of lung field: Secondary | ICD-10-CM | POA: Insufficient documentation

## 2016-04-02 DIAGNOSIS — C641 Malignant neoplasm of right kidney, except renal pelvis: Secondary | ICD-10-CM | POA: Diagnosis not present

## 2016-04-02 DIAGNOSIS — R930 Abnormal findings on diagnostic imaging of skull and head, not elsewhere classified: Secondary | ICD-10-CM | POA: Diagnosis not present

## 2016-04-02 MED ORDER — SODIUM CHLORIDE 0.9 % IJ SOLN
INTRAMUSCULAR | Status: AC
  Filled 2016-04-02: qty 50

## 2016-04-02 MED ORDER — IOPAMIDOL (ISOVUE-300) INJECTION 61%
INTRAVENOUS | Status: AC
  Filled 2016-04-02: qty 100

## 2016-04-02 MED ORDER — IOPAMIDOL (ISOVUE-300) INJECTION 61%
100.0000 mL | Freq: Once | INTRAVENOUS | Status: AC | PRN
  Administered 2016-04-02: 100 mL via INTRAVENOUS

## 2016-04-03 DIAGNOSIS — R531 Weakness: Secondary | ICD-10-CM | POA: Diagnosis not present

## 2016-04-03 DIAGNOSIS — M6281 Muscle weakness (generalized): Secondary | ICD-10-CM | POA: Diagnosis not present

## 2016-04-04 ENCOUNTER — Ambulatory Visit (HOSPITAL_BASED_OUTPATIENT_CLINIC_OR_DEPARTMENT_OTHER): Payer: PPO | Admitting: Oncology

## 2016-04-04 ENCOUNTER — Telehealth: Payer: Self-pay | Admitting: Oncology

## 2016-04-04 ENCOUNTER — Other Ambulatory Visit (HOSPITAL_BASED_OUTPATIENT_CLINIC_OR_DEPARTMENT_OTHER): Payer: PPO

## 2016-04-04 VITALS — BP 154/61 | HR 54 | Temp 98.8°F | Resp 18 | Ht 70.0 in | Wt 256.0 lb

## 2016-04-04 DIAGNOSIS — I10 Essential (primary) hypertension: Secondary | ICD-10-CM | POA: Diagnosis not present

## 2016-04-04 DIAGNOSIS — C7951 Secondary malignant neoplasm of bone: Secondary | ICD-10-CM

## 2016-04-04 DIAGNOSIS — G893 Neoplasm related pain (acute) (chronic): Secondary | ICD-10-CM

## 2016-04-04 DIAGNOSIS — C641 Malignant neoplasm of right kidney, except renal pelvis: Secondary | ICD-10-CM

## 2016-04-04 DIAGNOSIS — C649 Malignant neoplasm of unspecified kidney, except renal pelvis: Secondary | ICD-10-CM

## 2016-04-04 DIAGNOSIS — Z7189 Other specified counseling: Secondary | ICD-10-CM

## 2016-04-04 LAB — MAGNESIUM: Magnesium: 2.3 mg/dl (ref 1.5–2.5)

## 2016-04-04 LAB — CBC WITH DIFFERENTIAL/PLATELET
BASO%: 0.6 % (ref 0.0–2.0)
Basophils Absolute: 0 10*3/uL (ref 0.0–0.1)
EOS%: 3.1 % (ref 0.0–7.0)
Eosinophils Absolute: 0.2 10*3/uL (ref 0.0–0.5)
HCT: 45.8 % (ref 38.4–49.9)
HGB: 14.7 g/dL (ref 13.0–17.1)
LYMPH%: 29.6 % (ref 14.0–49.0)
MCH: 29.6 pg (ref 27.2–33.4)
MCHC: 32.1 g/dL (ref 32.0–36.0)
MCV: 92.2 fL (ref 79.3–98.0)
MONO#: 0.8 10*3/uL (ref 0.1–0.9)
MONO%: 12 % (ref 0.0–14.0)
NEUT#: 3.7 10*3/uL (ref 1.5–6.5)
NEUT%: 54.7 % (ref 39.0–75.0)
Platelets: 173 10*3/uL (ref 140–400)
RBC: 4.97 10*6/uL (ref 4.20–5.82)
RDW: 15.6 % — ABNORMAL HIGH (ref 11.0–14.6)
WBC: 6.7 10*3/uL (ref 4.0–10.3)
lymph#: 2 10*3/uL (ref 0.9–3.3)

## 2016-04-04 LAB — COMPREHENSIVE METABOLIC PANEL
ALT: 46 U/L (ref 0–55)
AST: 17 U/L (ref 5–34)
Albumin: 3.9 g/dL (ref 3.5–5.0)
Alkaline Phosphatase: 72 U/L (ref 40–150)
Anion Gap: 8 mEq/L (ref 3–11)
BUN: 17.3 mg/dL (ref 7.0–26.0)
CO2: 29 mEq/L (ref 22–29)
Calcium: 10.6 mg/dL — ABNORMAL HIGH (ref 8.4–10.4)
Chloride: 101 mEq/L (ref 98–109)
Creatinine: 0.8 mg/dL (ref 0.7–1.3)
EGFR: 88 mL/min/{1.73_m2} — ABNORMAL LOW (ref >90)
Glucose: 186 mg/dl — ABNORMAL HIGH (ref 70–140)
Potassium: 5.1 mEq/L (ref 3.5–5.1)
Sodium: 138 mEq/L (ref 136–145)
Total Bilirubin: 0.42 mg/dL (ref 0.20–1.20)
Total Protein: 7.2 g/dL (ref 6.4–8.3)

## 2016-04-04 NOTE — Patient Instructions (Signed)
Nivolumab injection What is this medicine? NIVOLUMAB (nye VOL ue mab) is a monoclonal antibody. It is used to treat melanoma, lung cancer, kidney cancer, head and neck cancer, Hodgkin lymphoma, and urothelial cancer. COMMON BRAND NAME(S): Opdivo What should I tell my health care provider before I take this medicine? They need to know if you have any of these conditions: -diabetes -immune system problems -kidney disease -liver disease -lung disease -organ transplant -stomach or intestine problems -thyroid disease -an unusual or allergic reaction to nivolumab, other medicines, foods, dyes, or preservatives -pregnant or trying to get pregnant -breast-feeding How should I use this medicine? This medicine is for infusion into a vein. It is given by a health care professional in a hospital or clinic setting. A special MedGuide will be given to you before each treatment. Be sure to read this information carefully each time. Talk to your pediatrician regarding the use of this medicine in children. Special care may be needed. What if I miss a dose? It is important not to miss your dose. Call your doctor or health care professional if you are unable to keep an appointment. What may interact with this medicine? Interactions have not been studied. Give your health care provider a list of all the medicines, herbs, non-prescription drugs, or dietary supplements you use. Also tell them if you smoke, drink alcohol, or use illegal drugs. Some items may interact with your medicine. What should I watch for while using this medicine? This drug may make you feel generally unwell. Continue your course of treatment even though you feel ill unless your doctor tells you to stop. You may need blood work done while you are taking this medicine. Do not become pregnant while taking this medicine or for 5 months after stopping it. Women should inform their doctor if they wish to become pregnant or think they might be  pregnant. There is a potential for serious side effects to an unborn child. Talk to your health care professional or pharmacist for more information. Do not breast-feed an infant while taking this medicine. What side effects may I notice from receiving this medicine? Side effects that you should report to your doctor or health care professional as soon as possible: -allergic reactions like skin rash, itching or hives, swelling of the face, lips, or tongue -black, tarry stools -blood in the urine -bloody or watery diarrhea -changes in vision -change in sex drive -changes in emotions or moods -chest pain -confusion -cough -decreased appetite -diarrhea -facial flushing -feeling faint or lightheaded -fever, chills -hair loss -hallucination, loss of contact with reality -headache -irritable -joint pain -loss of memory -muscle pain -muscle weakness -seizures -shortness of breath -signs and symptoms of high blood sugar such as dizziness; dry mouth; dry skin; fruity breath; nausea; stomach pain; increased hunger or thirst; increased urination -signs and symptoms of kidney injury like trouble passing urine or change in the amount of urine -signs and symptoms of liver injury like dark yellow or brown urine; general ill feeling or flu-like symptoms; light-colored stools; loss of appetite; nausea; right upper belly pain; unusually weak or tired; yellowing of the eyes or skin -stiff neck -swelling of the ankles, feet, hands -weight gain Side effects that usually do not require medical attention (report to your doctor or health care professional if they continue or are bothersome): -bone pain -constipation -tiredness -vomiting Where should I keep my medicine? This drug is given in a hospital or clinic and will not be stored at home.  2017 Elsevier/Gold   Standard (2015-04-01 09:04:36)  

## 2016-04-04 NOTE — Telephone Encounter (Signed)
Chemo scheduled for 02/15 and 03/1, with labs and follow up. Date adjustment ok per dr [name]. (verbal)  Appointments scheduled per, 04/05/16 los. Patient was given a copy of the AVS report and appointment schedule, per 04/05/16 los.

## 2016-04-04 NOTE — Progress Notes (Signed)
Education information on Nivolumab given to patient.  Information reveiwed with patient and patient's significant other, [name].  No questions at this time.

## 2016-04-04 NOTE — Progress Notes (Signed)
[facility] OFFICE PROGRESS NOTE   Diagnosis: Renal cell carcinoma  INTERVAL HISTORY:   Phillip [name] returns as scheduled. The buttock pain continues to improve. He is significant other reports he continues to have mild confusion. He is followed in the pain clinic and the narcotics are being weaned. He is not taking oxycodone for breakthrough pain. He is ambulating. No new complaint.  Objective:  Vital signs in last 24 hours:  Blood pressure (!) 154/61, pulse (!) 54, temperature 98.8 F (37.1 C), temperature source Oral, resp. rate 18, height 5\' 10"  (1.778 m), weight 256 lb (116.1 kg), SpO2 96 %.    HEENT: Neck without mass Lymphatics: No cervical, supraclavicular, axillary, or inguinal nodes Resp: Lungs clear bilaterally Cardio: Regular rate and rhythm GI: No hepatosplenomegaly, no mass, nontender Vascular: No leg edema Neuro: Alert, oriented, follows commands, ambulates without difficulty      Lab Results:  Lab Results  Component Value Date   WBC 6.7 04/04/2016   HGB 14.7 04/04/2016   HCT 45.8 04/04/2016   MCV 92.2 04/04/2016   PLT 173 04/04/2016   NEUTROABS 3.7 04/04/2016      Imaging:  Ct Abdomen Pelvis W Wo Contrast  Result Date: 04/02/2016 CLINICAL DATA:  Renal cell carcinoma. EXAM: CT CHEST, ABDOMEN, AND PELVIS WITH CONTRAST TECHNIQUE: Multidetector CT imaging of the chest, abdomen and pelvis was performed following the standard protocol during bolus administration of intravenous contrast. CONTRAST:  182mL ISOVUE-300 IOPAMIDOL (ISOVUE-300) INJECTION 61% COMPARISON:  11/18/2015. FINDINGS: CT CHEST FINDINGS Cardiovascular: The heart size is normal. No pericardial effusion. Coronary artery calcification is noted. Atherosclerotic calcification is noted in the wall of the thoracic aorta. Mediastinum/Nodes: Thyroid nodularity stable, including exophytic heterogeneous posterior left thyroid nodule. No mediastinal lymphadenopathy. There is no hilar  lymphadenopathy. There is no axillary lymphadenopathy. Lungs/Pleura: Previously measured 5 x 11 mm right lower lobe nodule is now 7 x 9 mm (image 75 series 14). 1-2 mm right lower lobe nodule seen image 71 is stable. No suspicious pulmonary nodule or mass. No focal airspace consolidation. No pulmonary edema or pleural effusion. Musculoskeletal: Expansile 4.4 x 2.3 cm lesion in the posterior and medial right eleventh rib has invaded into the right T11 pedicle, progressive in the interval. CT ABDOMEN PELVIS FINDINGS Hepatobiliary: 15 mm low-density lesions central left liver is stable. 7 mm calcified gallstone evident. No intrahepatic or extrahepatic biliary dilation. Pancreas: No focal mass lesion. No dilatation of the main duct. No intraparenchymal cyst. No peripancreatic edema. Spleen: Probable 3.6 cm hypervascular lesion anterior spleen seen image 50 series 6. Adrenals/Urinary Tract: Stable 9 x 18 mm hypervascular right adrenal nodule. Left adrenal gland unremarkable. Right kidney is unremarkable. 5.4 x 4.9 cm exophytic lesion in the upper pole of the right kidney shows heterogeneous peripheral enhancement. This lesion is not substantially changed in the interval, measuring 5.4 x 5.0 cm previously. No evidence for hydroureter. The urinary bladder appears normal for the degree of distention. Stomach/Bowel: Stomach is nondistended. No gastric wall thickening. No evidence of outlet obstruction. Duodenum is normally positioned as is the ligament of Treitz. No small bowel wall thickening. No small bowel dilatation. The terminal ileum is normal. The appendix is normal. Diverticular changes are noted in the left colon without evidence of diverticulitis. Vascular/Lymphatic: There is abdominal aortic atherosclerosis without aneurysm. Ill-defined soft tissue is seen around the distal aorta and aortic bifurcation, similar to prior. 12 mm retrocaval lymph node seen on image 121 of series 10 is stable. No pathologically  enlarged lymph nodes in either pelvic sidewall. Reproductive: Prostate gland is enlarged. Other: No intraperitoneal free fluid. Musculoskeletal: 2.8 cm lesion posterior left acetabulum shows interval development of a sclerotic rim. Previously seen destructive lesion in the right aspect of the L4 vertebral body is again identified and soft tissue component in the region of the pedicle and transverse process appears decreased in the interval although overall long axis dimensions of the lesion are unchanged. IMPRESSION: 1. No substantial change in the 5.4 cm exophytic left renal mass compatible with renal cell carcinoma. 2. 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 Although the soft tissue component associated with the lesion in the posterior right eleventh rib and right L4 vertebral body appears decreased in the interval, the bony destruction at the level of T11 has progressed. There is also a lesion in the posterior left acetabulum that is more sclerotic today, potentially reflecting disease progression although bone healing secondary to therapeutic response could also have this appearance. 3. The periaortic ill-defined soft tissue attenuation identified previously is stable in the interval. Mildly enlarged retrocaval lymph node is stable. 4. Stable small hypervascular right adrenal nodule. Close attention on follow-up recommended. 5. 3.6 cm hypervascular lesion anterior spleen seen only on arterial phase imaging. This is nonspecific, but attention on follow-up recommended. 6. Stable right lung nodules. 7.  Abdominal Aortic Atherosclerois (ICD10-170.0). Electronically Signed   By: Misty Stanley M.D.   On: 04/02/2016 17:17   Ct Head W Wo Contrast  Result Date: 04/02/2016 CLINICAL DATA:  Renal cell cancer diagnosed September 2017. Restaging. EXAM: CT HEAD WITHOUT AND WITH CONTRAST TECHNIQUE: Contiguous axial images were obtained from the base of the skull through the vertex without and with intravenous contrast  CONTRAST:  152mL ISOVUE-300 IOPAMIDOL (ISOVUE-300) INJECTION 61% COMPARISON:  None. FINDINGS: Brain: The brain shows mild generalized atrophy. Brainstem and cerebellum are unremarkable. In the left anterior body of the caudate, there is a small region of enhancement with adjacent white matter low density. This is favored to represent a benign process such as developmental venous anomaly, but a small metastasis with adjacent edema is not excluded. MRI recommended for further characterization. No hydrocephalus. No extra-axial collection. Vascular: There is atherosclerotic calcification of the major vessels at the base of the brain. Skull: Negative Sinuses/Orbits: Clear/normal Other: None significant IMPRESSION: Mild enhancement of the left anterior body of the caudate with adjacent low density. Whereas this probably represents either an old stroke and/or developmental venous anomaly, in this clinical setting, the possibility of a small metastasis is not excluded. MRI with and without contrast suggested for evaluation. Electronically Signed   By: Nelson Chimes M.D.   On: 04/02/2016 15:37   Ct Chest W Contrast  Result Date: 04/02/2016 CLINICAL DATA:  Renal cell carcinoma. EXAM: CT CHEST, ABDOMEN, AND PELVIS WITH CONTRAST TECHNIQUE: Multidetector CT imaging of the chest, abdomen and pelvis was performed following the standard protocol during bolus administration of intravenous contrast. CONTRAST:  186mL ISOVUE-300 IOPAMIDOL (ISOVUE-300) INJECTION 61% COMPARISON:  11/18/2015. FINDINGS: CT CHEST FINDINGS Cardiovascular: The heart size is normal. No pericardial effusion. Coronary artery calcification is noted. Atherosclerotic calcification is noted in the wall of the thoracic aorta. Mediastinum/Nodes: Thyroid nodularity stable, including exophytic heterogeneous posterior left thyroid nodule. No mediastinal lymphadenopathy. There is no hilar lymphadenopathy. There is no axillary lymphadenopathy. Lungs/Pleura: Previously  measured 5 x 11 mm right lower lobe nodule is now 7 x 9 mm (image 75 series 14). 1-2 mm right lower lobe nodule seen image 71 is stable. No  suspicious pulmonary nodule or mass. No focal airspace consolidation. No pulmonary edema or pleural effusion. Musculoskeletal: Expansile 4.4 x 2.3 cm lesion in the posterior and medial right eleventh rib has invaded into the right T11 pedicle, progressive in the interval. CT ABDOMEN PELVIS FINDINGS Hepatobiliary: 15 mm low-density lesions central left liver is stable. 7 mm calcified gallstone evident. No intrahepatic or extrahepatic biliary dilation. Pancreas: No focal mass lesion. No dilatation of the main duct. No intraparenchymal cyst. No peripancreatic edema. Spleen: Probable 3.6 cm hypervascular lesion anterior spleen seen image 50 series 6. Adrenals/Urinary Tract: Stable 9 x 18 mm hypervascular right adrenal nodule. Left adrenal gland unremarkable. Right kidney is unremarkable. 5.4 x 4.9 cm exophytic lesion in the upper pole of the right kidney shows heterogeneous peripheral enhancement. This lesion is not substantially changed in the interval, measuring 5.4 x 5.0 cm previously. No evidence for hydroureter. The urinary bladder appears normal for the degree of distention. Stomach/Bowel: Stomach is nondistended. No gastric wall thickening. No evidence of outlet obstruction. Duodenum is normally positioned as is the ligament of Treitz. No small bowel wall thickening. No small bowel dilatation. The terminal ileum is normal. The appendix is normal. Diverticular changes are noted in the left colon without evidence of diverticulitis. Vascular/Lymphatic: There is abdominal aortic atherosclerosis without aneurysm. Ill-defined soft tissue is seen around the distal aorta and aortic bifurcation, similar to prior. 12 mm retrocaval lymph node seen on image 121 of series 10 is stable. No pathologically enlarged lymph nodes in either pelvic sidewall. Reproductive: Prostate gland is  enlarged. Other: No intraperitoneal free fluid. Musculoskeletal: 2.8 cm lesion posterior left acetabulum shows interval development of a sclerotic rim. Previously seen destructive lesion in the right aspect of the L4 vertebral body is again identified and soft tissue component in the region of the pedicle and transverse process appears decreased in the interval although overall long axis dimensions of the lesion are unchanged. IMPRESSION: 1. No substantial change in the 5.4 cm exophytic left renal mass compatible with renal cell carcinoma. 2. 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 Although the soft tissue component associated with the lesion in the posterior right eleventh rib and right L4 vertebral body appears decreased in the interval, the bony destruction at the level of T11 has progressed. There is also a lesion in the posterior left acetabulum that is more sclerotic today, potentially reflecting disease progression although bone healing secondary to therapeutic response could also have this appearance. 3. The periaortic ill-defined soft tissue attenuation identified previously is stable in the interval. Mildly enlarged retrocaval lymph node is stable. 4. Stable small hypervascular right adrenal nodule. Close attention on follow-up recommended. 5. 3.6 cm hypervascular lesion anterior spleen seen only on arterial phase imaging. This is nonspecific, but attention on follow-up recommended. 6. Stable right lung nodules. 7.  Abdominal Aortic Atherosclerois (ICD10-170.0). Electronically Signed   By: Misty Stanley M.D.   On: 04/02/2016 17:17   CT images -were reviewed Medications: I have reviewed the patient's current medications.  Assessment/Plan: 1. Metastatic renal cell carcinoma   L4 mass with extraosseous extension, L4 nerve compression  Biopsy of the L4 mass 11/18/2015 confirmed metastatic renal cell carcinoma, clear cell type  CTs of the chest, abdomen, and pelvis 11/18/2015-right lower lobe  nodule, expansile lytic lesion at the right 11th rib/costal vertebral junction, left renal mass, expansile lesion involving the L4 vertebra, lytic lesion at the left acetabulum, and a low-attenuation liver lesion  Initiation of SRS to L4 12/02/2015, Completed 12/12/2015  Initiation  of Pazopanib11/04/2015  Pazopanib placed on hold 02/06/2016 secondary to elevated liver enzymes  Pazopanib resumed 03/07/2016 at a dose of 400 mg daily  Pazopanib discontinued 03/19/2016 secondary to elevated liver enzymes  Restaging CTs 04/02/2016-stable left renal mass, decreased soft tissue component associated with the L4 metastasis, increased soft tissue component associated with the right 11th rib metastasis with increased T11 bony destruction, increased sclerosis at the left acetabulum lesion   2. Pain secondary to #1-improved 3. Hypertension 4.   Altered metal status-most likely related to narcotic analgesics  CT brain 04/02/2016-mild enhancement in the left caudate-indeterminate   Disposition:  Phillip [name] has metastatic renal cell carcinoma. The buttock pain improved following radiation to L4. The restaging evaluation reveals overall stable disease with an increased metastasis at the right 11th rib/T11. He developed recurrent elevation of the liver enzymes when he was placed on reduced dose pazopanib. Elevation of the liver enzymes is associated with other tyrosine kinase inhibitors used for treatment of renal cell carcinoma. I recommend switching to immunotherapy.  I think it would be difficult for Phillip [name] to tolerate combined treatment with ipilumumab/nivolumab. I recommend treatment with single agent nivolumab. We reviewed the potential toxicities associated with nivolumab and he agrees to proceed. He will begin treatment on 04/12/2016.  30 minutes were spent with patient today. The majority of the time was used for counseling and coordination of care.    Betsy Coder, MD  04/04/2016    3:03 PM

## 2016-04-06 DIAGNOSIS — M6281 Muscle weakness (generalized): Secondary | ICD-10-CM | POA: Diagnosis not present

## 2016-04-06 DIAGNOSIS — R531 Weakness: Secondary | ICD-10-CM | POA: Diagnosis not present

## 2016-04-08 ENCOUNTER — Other Ambulatory Visit: Payer: Self-pay | Admitting: Oncology

## 2016-04-10 ENCOUNTER — Other Ambulatory Visit: Payer: Self-pay | Admitting: Radiation Oncology

## 2016-04-10 DIAGNOSIS — M6281 Muscle weakness (generalized): Secondary | ICD-10-CM | POA: Diagnosis not present

## 2016-04-10 DIAGNOSIS — R531 Weakness: Secondary | ICD-10-CM | POA: Diagnosis not present

## 2016-04-12 ENCOUNTER — Other Ambulatory Visit (HOSPITAL_BASED_OUTPATIENT_CLINIC_OR_DEPARTMENT_OTHER): Payer: PPO

## 2016-04-12 ENCOUNTER — Ambulatory Visit (HOSPITAL_BASED_OUTPATIENT_CLINIC_OR_DEPARTMENT_OTHER): Payer: PPO

## 2016-04-12 VITALS — BP 133/66 | HR 58 | Temp 98.4°F | Resp 18

## 2016-04-12 DIAGNOSIS — C642 Malignant neoplasm of left kidney, except renal pelvis: Secondary | ICD-10-CM

## 2016-04-12 DIAGNOSIS — C641 Malignant neoplasm of right kidney, except renal pelvis: Secondary | ICD-10-CM

## 2016-04-12 DIAGNOSIS — C7951 Secondary malignant neoplasm of bone: Secondary | ICD-10-CM | POA: Diagnosis not present

## 2016-04-12 DIAGNOSIS — Z5112 Encounter for antineoplastic immunotherapy: Secondary | ICD-10-CM

## 2016-04-12 LAB — COMPREHENSIVE METABOLIC PANEL
ALT: 27 U/L (ref 0–55)
AST: 14 U/L (ref 5–34)
Albumin: 3.9 g/dL (ref 3.5–5.0)
Alkaline Phosphatase: 78 U/L (ref 40–150)
Anion Gap: 10 mEq/L (ref 3–11)
BUN: 16 mg/dL (ref 7.0–26.0)
CO2: 25 mEq/L (ref 22–29)
Calcium: 10 mg/dL (ref 8.4–10.4)
Chloride: 103 mEq/L (ref 98–109)
Creatinine: 0.9 mg/dL (ref 0.7–1.3)
EGFR: 86 mL/min/{1.73_m2} — ABNORMAL LOW (ref >90)
Glucose: 225 mg/dl — ABNORMAL HIGH (ref 70–140)
Potassium: 4.3 mEq/L (ref 3.5–5.1)
Sodium: 137 mEq/L (ref 136–145)
Total Bilirubin: 0.51 mg/dL (ref 0.20–1.20)
Total Protein: 7.2 g/dL (ref 6.4–8.3)

## 2016-04-12 MED ORDER — SODIUM CHLORIDE 0.9 % IV SOLN
Freq: Once | INTRAVENOUS | Status: AC
  Administered 2016-04-12: 14:00:00 via INTRAVENOUS

## 2016-04-12 MED ORDER — SODIUM CHLORIDE 0.9 % IV SOLN
240.0000 mg | Freq: Once | INTRAVENOUS | Status: AC
  Administered 2016-04-12: 240 mg via INTRAVENOUS
  Filled 2016-04-12: qty 20

## 2016-04-12 NOTE — Patient Instructions (Signed)
[facility] Discharge Instructions for Patients Receiving Chemotherapy  Today you received the following chemotherapy agents: Nivolumab   To help prevent nausea and vomiting after your treatment, we encourage you to take your nausea medication as directed.    If you develop nausea and vomiting that is not controlled by your nausea medication, call the clinic.   BELOW ARE SYMPTOMS THAT SHOULD BE REPORTED IMMEDIATELY:  *FEVER GREATER THAN 100.5 F  *CHILLS WITH OR WITHOUT FEVER  NAUSEA AND VOMITING THAT IS NOT CONTROLLED WITH YOUR NAUSEA MEDICATION  *UNUSUAL SHORTNESS OF BREATH  *UNUSUAL BRUISING OR BLEEDING  TENDERNESS IN MOUTH AND THROAT WITH OR WITHOUT PRESENCE OF ULCERS  *URINARY PROBLEMS  *BOWEL PROBLEMS  UNUSUAL RASH Items with * indicate a potential emergency and should be followed up as soon as possible.  Feel free to call the clinic you have any questions or concerns. The clinic phone number is [phone].  Please show the CHEMO ALERT CARD at check-in to the Emergency Department and triage nurse.   Nivolumab injection What is this medicine? NIVOLUMAB (nye VOL ue mab) is a monoclonal antibody. It is used to treat melanoma, lung cancer, kidney cancer, head and neck cancer, Hodgkin lymphoma, and urothelial cancer. COMMON BRAND NAME(S): Opdivo What should I tell my health care provider before I take this medicine? They need to know if you have any of these conditions: -diabetes -immune system problems -kidney disease -liver disease -lung disease -organ transplant -stomach or intestine problems -thyroid disease -an unusual or allergic reaction to nivolumab, other medicines, foods, dyes, or preservatives -pregnant or trying to get pregnant -breast-feeding How should I use this medicine? This medicine is for infusion into a vein. It is given by a health care professional in a hospital or clinic setting. A special MedGuide will be given to you  before each treatment. Be sure to read this information carefully each time. Talk to your pediatrician regarding the use of this medicine in children. Special care may be needed. What if I miss a dose? It is important not to miss your dose. Call your doctor or health care professional if you are unable to keep an appointment. What may interact with this medicine? Interactions have not been studied. Give your health care provider a list of all the medicines, herbs, non-prescription drugs, or dietary supplements you use. Also tell them if you smoke, drink alcohol, or use illegal drugs. Some items may interact with your medicine. What should I watch for while using this medicine? This drug may make you feel generally unwell. Continue your course of treatment even though you feel ill unless your doctor tells you to stop. You may need blood work done while you are taking this medicine. Do not become pregnant while taking this medicine or for 5 months after stopping it. Women should inform their doctor if they wish to become pregnant or think they might be pregnant. There is a potential for serious side effects to an unborn child. Talk to your health care professional or pharmacist for more information. Do not breast-feed an infant while taking this medicine. What side effects may I notice from receiving this medicine? Side effects that you should report to your doctor or health care professional as soon as possible: -allergic reactions like skin rash, itching or hives, swelling of the face, lips, or tongue -black, tarry stools -blood in the urine -bloody or watery diarrhea -changes in vision -change in sex drive -changes in emotions or moods -chest pain -  confusion -cough -decreased appetite -diarrhea -facial flushing -feeling faint or lightheaded -fever, chills -hair loss -hallucination, loss of contact with reality -headache -irritable -joint pain -loss of memory -muscle pain -muscle  weakness -seizures -shortness of breath -signs and symptoms of high blood sugar such as dizziness; dry mouth; dry skin; fruity breath; nausea; stomach pain; increased hunger or thirst; increased urination -signs and symptoms of kidney injury like trouble passing urine or change in the amount of urine -signs and symptoms of liver injury like dark yellow or brown urine; general ill feeling or flu-like symptoms; light-colored stools; loss of appetite; nausea; right upper belly pain; unusually weak or tired; yellowing of the eyes or skin -stiff neck -swelling of the ankles, feet, hands -weight gain Side effects that usually do not require medical attention (report to your doctor or health care professional if they continue or are bothersome): -bone pain -constipation -tiredness -vomiting Where should I keep my medicine? This drug is given in a hospital or clinic and will not be stored at home.  2017 Elsevier/Gold Standard (2015-04-01 09:04:36)   

## 2016-04-13 DIAGNOSIS — R531 Weakness: Secondary | ICD-10-CM | POA: Diagnosis not present

## 2016-04-13 DIAGNOSIS — M6281 Muscle weakness (generalized): Secondary | ICD-10-CM | POA: Diagnosis not present

## 2016-04-17 DIAGNOSIS — M6281 Muscle weakness (generalized): Secondary | ICD-10-CM | POA: Diagnosis not present

## 2016-04-17 DIAGNOSIS — R531 Weakness: Secondary | ICD-10-CM | POA: Diagnosis not present

## 2016-04-20 DIAGNOSIS — M6281 Muscle weakness (generalized): Secondary | ICD-10-CM | POA: Diagnosis not present

## 2016-04-20 DIAGNOSIS — R531 Weakness: Secondary | ICD-10-CM | POA: Diagnosis not present

## 2016-04-22 ENCOUNTER — Other Ambulatory Visit: Payer: Self-pay | Admitting: Oncology

## 2016-04-24 DIAGNOSIS — M6281 Muscle weakness (generalized): Secondary | ICD-10-CM | POA: Diagnosis not present

## 2016-04-24 DIAGNOSIS — R531 Weakness: Secondary | ICD-10-CM | POA: Diagnosis not present

## 2016-04-26 ENCOUNTER — Ambulatory Visit (HOSPITAL_BASED_OUTPATIENT_CLINIC_OR_DEPARTMENT_OTHER): Payer: PPO

## 2016-04-26 ENCOUNTER — Other Ambulatory Visit (HOSPITAL_BASED_OUTPATIENT_CLINIC_OR_DEPARTMENT_OTHER): Payer: PPO

## 2016-04-26 ENCOUNTER — Telehealth: Payer: Self-pay | Admitting: Nurse Practitioner

## 2016-04-26 ENCOUNTER — Ambulatory Visit (HOSPITAL_BASED_OUTPATIENT_CLINIC_OR_DEPARTMENT_OTHER): Payer: PPO | Admitting: Nurse Practitioner

## 2016-04-26 VITALS — BP 133/78 | HR 58 | Temp 98.7°F | Resp 18 | Ht 70.0 in | Wt 254.8 lb

## 2016-04-26 DIAGNOSIS — C641 Malignant neoplasm of right kidney, except renal pelvis: Secondary | ICD-10-CM | POA: Diagnosis not present

## 2016-04-26 DIAGNOSIS — C7951 Secondary malignant neoplasm of bone: Secondary | ICD-10-CM

## 2016-04-26 DIAGNOSIS — Z5112 Encounter for antineoplastic immunotherapy: Secondary | ICD-10-CM | POA: Diagnosis not present

## 2016-04-26 DIAGNOSIS — Z79899 Other long term (current) drug therapy: Secondary | ICD-10-CM | POA: Diagnosis not present

## 2016-04-26 DIAGNOSIS — C649 Malignant neoplasm of unspecified kidney, except renal pelvis: Secondary | ICD-10-CM

## 2016-04-26 DIAGNOSIS — C642 Malignant neoplasm of left kidney, except renal pelvis: Secondary | ICD-10-CM

## 2016-04-26 LAB — COMPREHENSIVE METABOLIC PANEL
ALT: 20 U/L (ref 0–55)
AST: 13 U/L (ref 5–34)
Albumin: 4.1 g/dL (ref 3.5–5.0)
Alkaline Phosphatase: 88 U/L (ref 40–150)
Anion Gap: 8 mEq/L (ref 3–11)
BUN: 15.6 mg/dL (ref 7.0–26.0)
CO2: 29 mEq/L (ref 22–29)
Calcium: 10.5 mg/dL — ABNORMAL HIGH (ref 8.4–10.4)
Chloride: 103 mEq/L (ref 98–109)
Creatinine: 0.8 mg/dL (ref 0.7–1.3)
EGFR: 89 mL/min/{1.73_m2} — ABNORMAL LOW (ref >90)
Glucose: 134 mg/dl (ref 70–140)
Potassium: 4.5 mEq/L (ref 3.5–5.1)
Sodium: 139 mEq/L (ref 136–145)
Total Bilirubin: 0.4 mg/dL (ref 0.20–1.20)
Total Protein: 7.4 g/dL (ref 6.4–8.3)

## 2016-04-26 LAB — CBC WITH DIFFERENTIAL/PLATELET
BASO%: 0.4 % (ref 0.0–2.0)
Basophils Absolute: 0 10*3/uL (ref 0.0–0.1)
EOS%: 3.8 % (ref 0.0–7.0)
Eosinophils Absolute: 0.3 10*3/uL (ref 0.0–0.5)
HCT: 44.7 % (ref 38.4–49.9)
HGB: 15 g/dL (ref 13.0–17.1)
LYMPH%: 19.5 % (ref 14.0–49.0)
MCH: 29.9 pg (ref 27.2–33.4)
MCHC: 33.6 g/dL (ref 32.0–36.0)
MCV: 89 fL (ref 79.3–98.0)
MONO#: 1 10*3/uL — ABNORMAL HIGH (ref 0.1–0.9)
MONO%: 12.1 % (ref 0.0–14.0)
NEUT#: 5.4 10*3/uL (ref 1.5–6.5)
NEUT%: 64.2 % (ref 39.0–75.0)
Platelets: 168 10*3/uL (ref 140–400)
RBC: 5.02 10*6/uL (ref 4.20–5.82)
RDW: 14.1 % (ref 11.0–14.6)
WBC: 8.4 10*3/uL (ref 4.0–10.3)
lymph#: 1.6 10*3/uL (ref 0.9–3.3)

## 2016-04-26 LAB — TSH: TSH: 0.201 m(IU)/L — ABNORMAL LOW (ref 0.320–4.118)

## 2016-04-26 MED ORDER — SODIUM CHLORIDE 0.9 % IV SOLN
Freq: Once | INTRAVENOUS | Status: AC
  Administered 2016-04-26: 14:00:00 via INTRAVENOUS

## 2016-04-26 MED ORDER — SODIUM CHLORIDE 0.9 % IV SOLN
240.0000 mg | Freq: Once | INTRAVENOUS | Status: AC
  Administered 2016-04-26: 240 mg via INTRAVENOUS
  Filled 2016-04-26: qty 20

## 2016-04-26 NOTE — Addendum Note (Signed)
Addended by: Owens Shark on: 04/26/2016 01:39 PM   Modules accepted: Orders

## 2016-04-26 NOTE — Telephone Encounter (Signed)
Appointments scheduled per 3/1 LOS. Patient given AVS report and calendars with future scheduled appointments. Patient requested to return on 3/16...ok per desk nurse.

## 2016-04-26 NOTE — Patient Instructions (Signed)
[facility] Discharge Instructions for Patients Receiving Chemotherapy  Today you received the following chemotherapy agents:  Nivolumab.  To help prevent nausea and vomiting after your treatment, we encourage you to take your nausea medication as directed.   If you develop nausea and vomiting that is not controlled by your nausea medication, call the clinic.   BELOW ARE SYMPTOMS THAT SHOULD BE REPORTED IMMEDIATELY:  *FEVER GREATER THAN 100.5 F  *CHILLS WITH OR WITHOUT FEVER  NAUSEA AND VOMITING THAT IS NOT CONTROLLED WITH YOUR NAUSEA MEDICATION  *UNUSUAL SHORTNESS OF BREATH  *UNUSUAL BRUISING OR BLEEDING  TENDERNESS IN MOUTH AND THROAT WITH OR WITHOUT PRESENCE OF ULCERS  *URINARY PROBLEMS  *BOWEL PROBLEMS  UNUSUAL RASH Items with * indicate a potential emergency and should be followed up as soon as possible.  Feel free to call the clinic you have any questions or concerns. The clinic phone number is [phone].  Please show the CHEMO ALERT CARD at check-in to the Emergency Department and triage nurse.   

## 2016-04-26 NOTE — Progress Notes (Signed)
  [place] OFFICE PROGRESS NOTE   Diagnosis:  Renal cell carcinoma  INTERVAL HISTORY:   Mr. Poitevint returns as scheduled. He completed cycle 1 nivolumab 04/12/2016. He denies nausea/vomiting. No mouth sores. No diarrhea. He intermittently notes a groin rash. No other rash. He has intermittent pain at the right upper leg similar to a "pulled muscle". He has occasional shortness of breath. No cough or fever. He has a good appetite. He has an occasional hot flash and at times feels "irritated". His wife notes mild intermittent confusion.  Objective:  Vital signs in last 24 hours:  Blood pressure 133/78, pulse (!) 58, temperature 98.7 F (37.1 C), temperature source Oral, resp. rate 18, height 5\' 10"  (1.778 m), weight 254 lb 12.8 oz (115.6 kg), SpO2 97 %.    HEENT: No thrush or ulcers. Resp: Lungs clear bilaterally. Cardio: Regular rate and rhythm. GI: Abdomen soft and nontender. No organomegaly. No mass. Vascular: No leg edema. Skin: No rash. Specifically no groin rash.    Lab Results:  Lab Results  Component Value Date   WBC 8.4 04/26/2016   HGB 15.0 04/26/2016   HCT 44.7 04/26/2016   MCV 89.0 04/26/2016   PLT 168 04/26/2016   NEUTROABS 5.4 04/26/2016    Imaging:  No results found.  Medications: I have reviewed the patient's current medications.  Assessment/Plan: 1. Metastatic renal cell carcinoma   L4 mass with extraosseous extension, L4 nerve compression  Biopsy of the L4 mass 11/18/2015 confirmed metastatic renal cell carcinoma, clear cell type  CTs of the chest, abdomen, and pelvis 11/18/2015-right lower lobe nodule, expansile lytic lesion at the right 11th rib/costal vertebral junction, left renal mass, expansile lesion involving the L4 vertebra, lytic lesion at the left acetabulum, and a low-attenuation liver lesion  Initiation of SRS to L4 12/02/2015, Completed 12/12/2015  Initiation of Pazopanib11/04/2015  Pazopanibplaced on hold  02/06/2016 secondary to elevated liver enzymes  Pazopanibresumed 03/07/2016 at a dose of 400 mg daily  Pazopanib discontinued 03/19/2016 secondary to elevated liver enzymes  Restaging CTs 04/02/2016-stable left renal mass, decreased soft tissue component associated with the L4 metastasis, increased soft tissue component associated with the right 11th rib metastasis with increased T11 bony destruction, increased sclerosis at the left acetabulum lesion  Cycle 1 nivolumab 04/12/2016  Cycle 2 nivolumab 04/26/2016   2. Pain secondary to #1-improved 3. Hypertension 4.   Altered metal status-most likely related to narcotic analgesics  CT brain 04/02/2016-mild enhancement in the left caudate-indeterminate   Disposition: Mr. Verge appears stable. He has completed 1 cycle of nivolumab. Plan to proceed with cycle 2 today as scheduled. He will return for a follow-up visit in 2 weeks. He will contact the office in the interim with any problems.    [name] ANP/GNP-BC   04/26/2016  12:18 PM

## 2016-04-27 DIAGNOSIS — R531 Weakness: Secondary | ICD-10-CM | POA: Diagnosis not present

## 2016-04-27 DIAGNOSIS — M6281 Muscle weakness (generalized): Secondary | ICD-10-CM | POA: Diagnosis not present

## 2016-05-03 DIAGNOSIS — M6281 Muscle weakness (generalized): Secondary | ICD-10-CM | POA: Diagnosis not present

## 2016-05-03 DIAGNOSIS — R531 Weakness: Secondary | ICD-10-CM | POA: Diagnosis not present

## 2016-05-06 ENCOUNTER — Other Ambulatory Visit: Payer: Self-pay | Admitting: Oncology

## 2016-05-08 DIAGNOSIS — C7951 Secondary malignant neoplasm of bone: Secondary | ICD-10-CM | POA: Diagnosis not present

## 2016-05-08 DIAGNOSIS — M545 Low back pain: Secondary | ICD-10-CM | POA: Diagnosis not present

## 2016-05-08 DIAGNOSIS — M5416 Radiculopathy, lumbar region: Secondary | ICD-10-CM | POA: Diagnosis not present

## 2016-05-10 ENCOUNTER — Ambulatory Visit: Payer: PPO

## 2016-05-11 ENCOUNTER — Other Ambulatory Visit: Payer: Self-pay | Admitting: Oncology

## 2016-05-11 ENCOUNTER — Telehealth: Payer: Self-pay | Admitting: Oncology

## 2016-05-11 ENCOUNTER — Ambulatory Visit (HOSPITAL_BASED_OUTPATIENT_CLINIC_OR_DEPARTMENT_OTHER): Payer: PPO

## 2016-05-11 ENCOUNTER — Other Ambulatory Visit (HOSPITAL_BASED_OUTPATIENT_CLINIC_OR_DEPARTMENT_OTHER): Payer: PPO

## 2016-05-11 ENCOUNTER — Telehealth: Payer: Self-pay | Admitting: *Deleted

## 2016-05-11 ENCOUNTER — Ambulatory Visit (HOSPITAL_COMMUNITY)
Admission: RE | Admit: 2016-05-11 | Discharge: 2016-05-11 | Disposition: A | Discharge status: Home / Self Care | Payer: PPO | Source: Ambulatory Visit | Attending: Oncology | Admitting: Oncology

## 2016-05-11 ENCOUNTER — Ambulatory Visit (HOSPITAL_BASED_OUTPATIENT_CLINIC_OR_DEPARTMENT_OTHER): Payer: PPO | Admitting: Oncology

## 2016-05-11 VITALS — BP 130/58 | HR 53 | Temp 98.5°F | Resp 18 | Ht 70.0 in | Wt 252.8 lb

## 2016-05-11 DIAGNOSIS — C649 Malignant neoplasm of unspecified kidney, except renal pelvis: Secondary | ICD-10-CM

## 2016-05-11 DIAGNOSIS — Z5112 Encounter for antineoplastic immunotherapy: Secondary | ICD-10-CM | POA: Diagnosis not present

## 2016-05-11 DIAGNOSIS — C7951 Secondary malignant neoplasm of bone: Secondary | ICD-10-CM

## 2016-05-11 DIAGNOSIS — M1711 Unilateral primary osteoarthritis, right knee: Secondary | ICD-10-CM | POA: Insufficient documentation

## 2016-05-11 DIAGNOSIS — I10 Essential (primary) hypertension: Secondary | ICD-10-CM | POA: Diagnosis not present

## 2016-05-11 DIAGNOSIS — M1611 Unilateral primary osteoarthritis, right hip: Secondary | ICD-10-CM | POA: Diagnosis not present

## 2016-05-11 DIAGNOSIS — G893 Neoplasm related pain (acute) (chronic): Secondary | ICD-10-CM

## 2016-05-11 DIAGNOSIS — M79604 Pain in right leg: Secondary | ICD-10-CM | POA: Insufficient documentation

## 2016-05-11 DIAGNOSIS — C642 Malignant neoplasm of left kidney, except renal pelvis: Secondary | ICD-10-CM

## 2016-05-11 DIAGNOSIS — C641 Malignant neoplasm of right kidney, except renal pelvis: Secondary | ICD-10-CM

## 2016-05-11 LAB — COMPREHENSIVE METABOLIC PANEL
ALT: 20 U/L (ref 0–55)
AST: 11 U/L (ref 5–34)
Albumin: 3.7 g/dL (ref 3.5–5.0)
Alkaline Phosphatase: 80 U/L (ref 40–150)
Anion Gap: 10 mEq/L (ref 3–11)
BUN: 17.5 mg/dL (ref 7.0–26.0)
CO2: 24 mEq/L (ref 22–29)
Calcium: 9.5 mg/dL (ref 8.4–10.4)
Chloride: 103 mEq/L (ref 98–109)
Creatinine: 0.9 mg/dL (ref 0.7–1.3)
EGFR: 86 mL/min/{1.73_m2} — ABNORMAL LOW (ref >90)
Glucose: 233 mg/dl — ABNORMAL HIGH (ref 70–140)
Potassium: 3.9 mEq/L (ref 3.5–5.1)
Sodium: 136 mEq/L (ref 136–145)
Total Bilirubin: 0.5 mg/dL (ref 0.20–1.20)
Total Protein: 6.8 g/dL (ref 6.4–8.3)

## 2016-05-11 LAB — CBC WITH DIFFERENTIAL/PLATELET
BASO%: 0.8 % (ref 0.0–2.0)
Basophils Absolute: 0.1 10*3/uL (ref 0.0–0.1)
EOS%: 5.4 % (ref 0.0–7.0)
Eosinophils Absolute: 0.4 10*3/uL (ref 0.0–0.5)
HCT: 43 % (ref 38.4–49.9)
HGB: 14.4 g/dL (ref 13.0–17.1)
LYMPH%: 21.6 % (ref 14.0–49.0)
MCH: 29.4 pg (ref 27.2–33.4)
MCHC: 33.6 g/dL (ref 32.0–36.0)
MCV: 87.4 fL (ref 79.3–98.0)
MONO#: 0.7 10*3/uL (ref 0.1–0.9)
MONO%: 8.6 % (ref 0.0–14.0)
NEUT#: 4.9 10*3/uL (ref 1.5–6.5)
NEUT%: 63.6 % (ref 39.0–75.0)
Platelets: 178 10*3/uL (ref 140–400)
RBC: 4.92 10*6/uL (ref 4.20–5.82)
RDW: 13.6 % (ref 11.0–14.6)
WBC: 7.6 10*3/uL (ref 4.0–10.3)
lymph#: 1.7 10*3/uL (ref 0.9–3.3)

## 2016-05-11 MED ORDER — SODIUM CHLORIDE 0.9 % IV SOLN
Freq: Once | INTRAVENOUS | Status: AC
  Administered 2016-05-11: 11:00:00 via INTRAVENOUS

## 2016-05-11 MED ORDER — SODIUM CHLORIDE 0.9 % IV SOLN
240.0000 mg | Freq: Once | INTRAVENOUS | Status: AC
  Administered 2016-05-11: 240 mg via INTRAVENOUS
  Filled 2016-05-11: qty 24

## 2016-05-11 NOTE — Telephone Encounter (Signed)
Called pt and [name] with Xray result. Both voiced understanding.

## 2016-05-11 NOTE — Patient Instructions (Signed)
[facility] Discharge Instructions for Patients Receiving Chemotherapy  Today you received the following chemotherapy agents:  Nivolumab.  To help prevent nausea and vomiting after your treatment, we encourage you to take your nausea medication as directed.   If you develop nausea and vomiting that is not controlled by your nausea medication, call the clinic.   BELOW ARE SYMPTOMS THAT SHOULD BE REPORTED IMMEDIATELY:  *FEVER GREATER THAN 100.5 F  *CHILLS WITH OR WITHOUT FEVER  NAUSEA AND VOMITING THAT IS NOT CONTROLLED WITH YOUR NAUSEA MEDICATION  *UNUSUAL SHORTNESS OF BREATH  *UNUSUAL BRUISING OR BLEEDING  TENDERNESS IN MOUTH AND THROAT WITH OR WITHOUT PRESENCE OF ULCERS  *URINARY PROBLEMS  *BOWEL PROBLEMS  UNUSUAL RASH Items with * indicate a potential emergency and should be followed up as soon as possible.  Feel free to call the clinic you have any questions or concerns. The clinic phone number is [phone].  Please show the CHEMO ALERT CARD at check-in to the Emergency Department and triage nurse.   

## 2016-05-11 NOTE — Telephone Encounter (Signed)
-----   Message from [name], MD sent at 05/11/2016  5:35 PM EDT ----- Please call patient, xrays show no cancer or rt. Hip or femur, has arthritis of hip and knee joints

## 2016-05-11 NOTE — Progress Notes (Signed)
  [name] OFFICE PROGRESS NOTE   Diagnosis: Renal cell carcinoma  INTERVAL HISTORY:   [name] returns as scheduled. He completed a second treatment with nivolumab on 04/26/2016. No rash or diarrhea. He took a trip to the beach last week. He developed increased pain in the right leg after lying in a bed they are. No leg swelling. He was seen at the pain clinic and is scheduled for a lumbar injection in 2 weeks.  Good appetite.  Objective:  Vital signs in last 24 hours:  Blood pressure (!) 130/58, pulse (!) 53, temperature 98.5 F (36.9 C), temperature source Oral, resp. rate 18, height 5\' 10"  (1.778 m), weight 252 lb 12.8 oz (114.7 kg), SpO2 97 %.    HEENT: No thrush or ulcers Resp: Lungs clear bilaterally Cardio: Regular rate and rhythm GI: No hepatosplenomegaly, no mass, nontender, examination of the right groin is unremarkable Vascular: No leg edema Musculoskeletal: Mild discomfort with motion at the right hip and percussion at the right thigh.    Lab Results:  Lab Results  Component Value Date   WBC 7.6 05/11/2016   HGB 14.4 05/11/2016   HCT 43.0 05/11/2016   MCV 87.4 05/11/2016   PLT 178 05/11/2016   NEUTROABS 4.9 05/11/2016     Medications: I have reviewed the patient's current medications.  Assessment/Plan: 1. Metastatic renal cell carcinoma   L4 mass with extraosseous extension, L4 nerve compression  Biopsy of the L4 mass 11/18/2015 confirmed metastatic renal cell carcinoma, clear cell type  CTs of the chest, abdomen, and pelvis 11/18/2015-right lower lobe nodule, expansile lytic lesion at the right 11th rib/costal vertebral junction, left renal mass, expansile lesion involving the L4 vertebra, lytic lesion at the left acetabulum, and a low-attenuation liver lesion  Initiation of SRS to L4 12/02/2015, Completed 12/12/2015  Initiation of Pazopanib11/04/2015  Pazopanib placed on hold 02/06/2016 secondary to elevated liver  enzymes  Pazopanib resumed 03/07/2016 at a dose of 400 mg daily  Pazopanibdiscontinued 03/19/2016 secondary to elevated liver enzymes  Restaging CTs 04/02/2016-stable left renal mass, decreased soft tissue component associated with the L4 metastasis, increased soft tissue component associated with the right 11th rib metastasis with increased T11 bony destruction, increased sclerosis at the left acetabulum lesion  Cycle 1 nivolumab 04/12/2016  Cycle 2 nivolumab 04/26/2016  Cycle 3 nivolumab 05/11/2016   2. Pain secondary to #1 3. Hypertension 4. Elevated transaminases 02/06/2016-Pazopanibplaced on hold  Liver enzymes normal 03/07/2016    Disposition:  [name] has completed 2 cycles nivolumab. He has tolerated the treatment well. The plan is to proceed with cycle 3 today. He will undergo a restaging CT evaluation after 5-6 cycles.  The pain in the right leg is most likely related to radicular pain from the lumbar spine. We will check a plain x-ray of the right hip and femur today to be sure he does not have a lytic lesion.  He will be referred for placement of a Port-A-Cath prior to the next treatment in 2 weeks.   Betsy Coder, MD  05/11/2016  10:24 AM

## 2016-05-11 NOTE — Telephone Encounter (Signed)
Appointments scheduled per 3.16.18 LOS. Patient given AVS report and calendars with future scheduled appointments.  °

## 2016-05-12 LAB — T4, FREE: T4,Free(Direct): 1.26 ng/dL (ref 0.82–1.77)

## 2016-05-15 ENCOUNTER — Other Ambulatory Visit: Payer: Self-pay | Admitting: Radiology

## 2016-05-16 ENCOUNTER — Encounter (HOSPITAL_COMMUNITY): Payer: Self-pay

## 2016-05-16 ENCOUNTER — Ambulatory Visit (HOSPITAL_COMMUNITY)
Admission: RE | Admit: 2016-05-16 | Discharge: 2016-05-16 | Disposition: A | Discharge status: Home / Self Care | Payer: PPO | Source: Ambulatory Visit | Attending: Oncology | Admitting: Oncology

## 2016-05-16 ENCOUNTER — Other Ambulatory Visit: Payer: Self-pay | Admitting: Oncology

## 2016-05-16 DIAGNOSIS — C7951 Secondary malignant neoplasm of bone: Secondary | ICD-10-CM | POA: Insufficient documentation

## 2016-05-16 DIAGNOSIS — I10 Essential (primary) hypertension: Secondary | ICD-10-CM | POA: Diagnosis not present

## 2016-05-16 DIAGNOSIS — C649 Malignant neoplasm of unspecified kidney, except renal pelvis: Secondary | ICD-10-CM | POA: Insufficient documentation

## 2016-05-16 DIAGNOSIS — Z5111 Encounter for antineoplastic chemotherapy: Secondary | ICD-10-CM | POA: Diagnosis not present

## 2016-05-16 HISTORY — PX: IR GENERIC HISTORICAL: IMG1180011

## 2016-05-16 LAB — CBC WITH DIFFERENTIAL/PLATELET
Basophils Absolute: 0 10*3/uL (ref 0.0–0.1)
Basophils Relative: 0 %
Eosinophils Absolute: 0.5 10*3/uL (ref 0.0–0.7)
Eosinophils Relative: 5 %
HCT: 44.9 % (ref 39.0–52.0)
Hemoglobin: 15.2 g/dL (ref 13.0–17.0)
Lymphocytes Relative: 24 %
Lymphs Abs: 2.2 10*3/uL (ref 0.7–4.0)
MCH: 29.5 pg (ref 26.0–34.0)
MCHC: 33.9 g/dL (ref 30.0–36.0)
MCV: 87.2 fL (ref 78.0–100.0)
Monocytes Absolute: 1 10*3/uL (ref 0.1–1.0)
Monocytes Relative: 11 %
Neutro Abs: 5.4 10*3/uL (ref 1.7–7.7)
Neutrophils Relative %: 60 %
Platelets: 206 10*3/uL (ref 150–400)
RBC: 5.15 MIL/uL (ref 4.22–5.81)
RDW: 13.2 % (ref 11.5–15.5)
WBC: 9.1 10*3/uL (ref 4.0–10.5)

## 2016-05-16 LAB — PROTIME-INR
INR: 0.98
Prothrombin Time: 13 seconds (ref 11.4–15.2)

## 2016-05-16 MED ORDER — LIDOCAINE HCL 1 % IJ SOLN
INTRAMUSCULAR | Status: AC | PRN
  Administered 2016-05-16: 10 mL

## 2016-05-16 MED ORDER — HEPARIN SOD (PORK) LOCK FLUSH 100 UNIT/ML IV SOLN
INTRAVENOUS | Status: AC | PRN
  Administered 2016-05-16: 500 [IU] via INTRAVENOUS

## 2016-05-16 MED ORDER — LIDOCAINE-EPINEPHRINE (PF) 2 %-1:200000 IJ SOLN
INTRAMUSCULAR | Status: AC | PRN
  Administered 2016-05-16: 10 mL

## 2016-05-16 MED ORDER — SODIUM CHLORIDE 0.9 % IV SOLN
INTRAVENOUS | Status: DC
  Administered 2016-05-16: 10:00:00 via INTRAVENOUS

## 2016-05-16 MED ORDER — CEFAZOLIN SODIUM-DEXTROSE 2-4 GM/100ML-% IV SOLN
2.0000 g | INTRAVENOUS | Status: AC
  Administered 2016-05-16: 2 g via INTRAVENOUS

## 2016-05-16 MED ORDER — MIDAZOLAM HCL 2 MG/2ML IJ SOLN
INTRAMUSCULAR | Status: AC
  Filled 2016-05-16: qty 6

## 2016-05-16 MED ORDER — LIDOCAINE HCL 1 % IJ SOLN
INTRAMUSCULAR | Status: AC
Start: 2016-05-16 — End: 2016-05-17
  Filled 2016-05-16: qty 20

## 2016-05-16 MED ORDER — HEPARIN SOD (PORK) LOCK FLUSH 100 UNIT/ML IV SOLN
INTRAVENOUS | Status: AC
  Filled 2016-05-16: qty 5

## 2016-05-16 MED ORDER — MIDAZOLAM HCL 2 MG/2ML IJ SOLN
INTRAMUSCULAR | Status: AC | PRN
  Administered 2016-05-16 (×4): 1 mg via INTRAVENOUS

## 2016-05-16 MED ORDER — FENTANYL CITRATE (PF) 100 MCG/2ML IJ SOLN
INTRAMUSCULAR | Status: AC | PRN
  Administered 2016-05-16: 25 ug via INTRAVENOUS
  Administered 2016-05-16: 50 ug via INTRAVENOUS
  Administered 2016-05-16: 25 ug via INTRAVENOUS

## 2016-05-16 MED ORDER — FENTANYL CITRATE (PF) 100 MCG/2ML IJ SOLN
INTRAMUSCULAR | Status: AC
  Filled 2016-05-16: qty 4

## 2016-05-16 MED ORDER — CEFAZOLIN SODIUM-DEXTROSE 2-4 GM/100ML-% IV SOLN
INTRAVENOUS | Status: AC
  Administered 2016-05-16: 2 g via INTRAVENOUS
  Filled 2016-05-16: qty 100

## 2016-05-16 MED ORDER — LIDOCAINE-EPINEPHRINE 2 %-1:200000 IJ SOLN
INTRAMUSCULAR | Status: AC
Start: 2016-05-16 — End: 2016-05-17
  Filled 2016-05-16: qty 20

## 2016-05-16 NOTE — H&P (Signed)
Referring Physician(s): [name]  Supervising Physician: [name]  Patient Status:  [facility] OP  Chief Complaint:  "I'm here to get a port a cath"  Subjective: Patient familiar to IR service from prior L4 mass biopsy in September 2017. He has a history of metastatic renal cell carcinoma with chemoradiation.He is now receiving immunotherapy and has poor venous access. He presents today for Port-A-Cath placement. He currently denies fever, headache, chest pain, dyspnea, cough, abdominal pain, nausea, vomiting or abnormal bleeding. He does have occasional back and right lower extremity pain. Past Medical History:  Diagnosis Date  . Hypertension    Past Surgical History:  Procedure Laterality Date  . IR GENERIC HISTORICAL  11/18/2015   IR FLUORO GUIDED NEEDLE PLC ASPIRATION/INJECTION LOC 11/18/2015 [name], MD [facility]-INTERV RAD      Allergies: Patient has no known allergies.  Medications: Prior to Admission medications   Medication Sig Start Date End Date Taking? Authorizing Provider  docusate sodium (COLACE) 100 MG capsule Take 100 mg by mouth 2 (two) times daily.   Yes Historical Provider, MD  LORazepam (ATIVAN) 0.5 MG tablet 1 tablet po 30 minutes prior to radiation or MRI 11/22/15  Yes Hayden Pedro, PA-C  losartan-hydrochlorothiazide (HYZAAR) 100-12.5 MG tablet Take 1 tablet by mouth daily.  09/23/15  Yes Historical Provider, MD  oxycodone (OXY-IR) 5 MG capsule Take 5 mg by mouth every 4 (four) hours as needed.   Yes Historical Provider, MD  oxymorphone (OPANA ER) 10 MG 12 hr tablet Take 10 mg by mouth every 12 (twelve) hours.   Yes Historical Provider, MD  polyethylene glycol (MIRALAX / GLYCOLAX) packet Take 17 g by mouth daily.   Yes Historical Provider, MD     Vital Signs: BP (!) 153/83 (BP Location: Right Arm)   Pulse (!) 58   Temp 98.6 F (37 C) (Oral)   Resp 18   Wt 251 lb 9.6 oz (114.1 kg)   SpO2 98%   BMI 36.10 kg/m   Physical Exam  Awake, alert. Chest clear to auscultation bilaterally. Heart with slightly bradycardic regular rhythm. Abdomen soft, positive bowel sounds, nontender. Trace pretibial edema bilaterally;mild discomfort with motion at the right hip  Imaging: No results found.  Labs:  CBC:  Recent Labs  04/04/16 1046 04/26/16 1152 05/11/16 0903 05/16/16 0952  WBC 6.7 8.4 7.6 9.1  HGB 14.7 15.0 14.4 15.2  HCT 45.8 44.7 43.0 44.9  PLT 173 168 178 206    COAGS:  Recent Labs  11/18/15 1337  INR 1.00  APTT 32    BMP:  Recent Labs  04/04/16 1046 04/12/16 1319 04/26/16 1152 05/11/16 0903  NA 138 137 139 136  K 5.1 4.3 4.5 3.9  CO2 29 25 29 24   GLUCOSE 186* 225* 134 233*  BUN 17.3 16.0 15.6 17.5  CALCIUM 10.6* 10.0 10.5* 9.5  CREATININE 0.8 0.9 0.8 0.9    LIVER FUNCTION TESTS:  Recent Labs  04/04/16 1046 04/12/16 1319 04/26/16 1152 05/11/16 0903  BILITOT 0.42 0.51 0.40 0.50  AST 17 14 13 11   ALT 46 27 20 20   ALKPHOS 72 78 88 80  PROT 7.2 7.2 7.4 6.8  ALBUMIN 3.9 3.9 4.1 3.7    Assessment and Plan: Pt with history of metastatic renal cell carcinoma ,s/p chemoradiation.He is now receiving immunotherapy and has poor venous access. He presents today for Port-A-Cath placement. Risks and benefits discussed with the patient/sig other including, but not limited to bleeding, infection, pneumothorax, or fibrin  sheath development and need for additional procedures.All of the patient's questions were answered, patient is agreeable to proceed.Consent signed and in chart.     Electronically Signed: D. Rowe Robert 05/16/2016, 10:06 AM   I spent a total of 20 minutes at the the patient's bedside AND on the patient's hospital floor or unit, greater than 50% of which was counseling/coordinating care for Port-A-Cath placement

## 2016-05-16 NOTE — Procedures (Signed)
Interventional Radiology Procedure Note  Procedure: Placement of a right IJ approach single lumen PowerPort.  Tip is positioned at the superior cavoatrial junction and catheter is ready for immediate use.  Complications: No immediate Recommendations:  - Ok to shower tomorrow - Do not submerge for 7 days - Routine line care   Signed,  Heath K. McCullough, MD   

## 2016-05-16 NOTE — Discharge Instructions (Signed)
Implanted Port Home Guide °An implanted port is a type of central line that is placed under the skin. Central lines are used to provide IV access when treatment or nutrition needs to be given through a person’s veins. Implanted ports are used for long-term IV access. An implanted port may be placed because: °· You need IV medicine that would be irritating to the small veins in your hands or arms. °· You need long-term IV medicines, such as antibiotics. °· You need IV nutrition for a long period. °· You need frequent blood draws for lab tests. °· You need dialysis. °Implanted ports are usually placed in the chest area, but they can also be placed in the upper arm, the abdomen, or the leg. An implanted port has two main parts: °· Reservoir. The reservoir is round and will appear as a small, raised area under your skin. The reservoir is the part where a needle is inserted to give medicines or draw blood. °· Catheter. The catheter is a thin, flexible tube that extends from the reservoir. The catheter is placed into a large vein. Medicine that is inserted into the reservoir goes into the catheter and then into the vein. °How will I care for my incision site? °Do not get the incision site wet. Bathe or shower as directed by your health care provider. °How is my port accessed? °Special steps must be taken to access the port: °· Before the port is accessed, a numbing cream can be placed on the skin. This helps numb the skin over the port site. °· Your health care provider uses a sterile technique to access the port. °¨ Your health care provider must put on a mask and sterile gloves. °¨ The skin over your port is cleaned carefully with an antiseptic and allowed to dry. °¨ The port is gently pinched between sterile gloves, and a needle is inserted into the port. °· Only "non-coring" port needles should be used to access the port. Once the port is accessed, a blood return should be checked. This helps ensure that the port is  in the vein and is not clogged. °· If your port needs to remain accessed for a constant infusion, a clear (transparent) bandage will be placed over the needle site. The bandage and needle will need to be changed every week, or as directed by your health care provider. °· Keep the bandage covering the needle clean and dry. Do not get it wet. Follow your health care provider’s instructions on how to take a shower or bath while the port is accessed. °· If your port does not need to stay accessed, no bandage is needed over the port. °What is flushing? °Flushing helps keep the port from getting clogged. Follow your health care provider’s instructions on how and when to flush the port. Ports are usually flushed with saline solution or a medicine called heparin. The need for flushing will depend on how the port is used. °· If the port is used for intermittent medicines or blood draws, the port will need to be flushed: °¨ After medicines have been given. °¨ After blood has been drawn. °¨ As part of routine maintenance. °· If a constant infusion is running, the port may not need to be flushed. °How long will my port stay implanted? °The port can stay in for as long as your health care provider thinks it is needed. When it is time for the port to come out, surgery will be done to remove it.   The procedure is similar to the one performed when the port was put in. °When should I seek immediate medical care? °When you have an implanted port, you should seek immediate medical care if: °· You notice a bad smell coming from the incision site. °· You have swelling, redness, or drainage at the incision site. °· You have more swelling or pain at the port site or the surrounding area. °· You have a fever that is not controlled with medicine. °This information is not intended to replace advice given to you by your health care provider. Make sure you discuss any questions you have with your health care provider. °Document Released:  02/12/2005 Document Revised: 07/21/2015 Document Reviewed: 10/20/2012 °Elsevier Interactive Patient Education © 2017 [employer] °Implanted Port Insertion, Care After °This sheet gives you information about how to care for yourself after your procedure. Your health care provider may also give you more specific instructions. If you have problems or questions, contact your health care provider. °What can I expect after the procedure? °After your procedure, it is common to have: °· Discomfort at the port insertion site. °· Bruising on the skin over the port. This should improve over 3-4 days. °Follow these instructions at home: °Port care  °· After your port is placed, you will get a manufacturer's information card. The card has information about your port. Keep this card with you at all times. °· Take care of the port as told by your health care provider. Ask your health care provider if you or a family member can get training for taking care of the port at home. A home health care nurse may also take care of the port. °· Make sure to remember what type of port you have. °Incision care  °· Follow instructions from your health care provider about how to take care of your port insertion site. Make sure you: °¨ Wash your hands with soap and water before you change your bandage (dressing). If soap and water are not available, use hand sanitizer. °¨ Change your dressing as told by your health care provider. °¨ Leave stitches (sutures), skin glue, or adhesive strips in place. These skin closures may need to stay in place for 2 weeks or longer. If adhesive strip edges start to loosen and curl up, you may trim the loose edges. Do not remove adhesive strips completely unless your health care provider tells you to do that. °· Check your port insertion site every day for signs of infection. Check for: °¨ More redness, swelling, or pain. °¨ More fluid or blood. °¨ Warmth. °¨ Pus or a bad smell. °General instructions  °· Do not  take baths, swim, or use a hot tub until your health care provider approves. °· Do not lift anything that is heavier than 10 lb (4.5 kg) for a week, or as told by your health care provider. °· Ask your health care provider when it is okay to: °¨ Return to work or school. °¨ Resume usual physical activities or sports. °· Do not drive for 24 hours if you were given a medicine to help you relax (sedative). °· Take over-the-counter and prescription medicines only as told by your health care provider. °· Wear a medical alert bracelet in case of an emergency. This will tell any health care providers that you have a port. °· Keep all follow-up visits as told by your health care provider. This is important. °Contact a health care provider if: °· You cannot flush your port   with saline as directed, or you cannot draw blood from the port. °· You have a fever or chills. °· You have more redness, swelling, or pain around your port insertion site. °· You have more fluid or blood coming from your port insertion site. °· Your port insertion site feels warm to the touch. °· You have pus or a bad smell coming from the port insertion site. °Get help right away if: °· You have chest pain or shortness of breath. °· You have bleeding from your port that you cannot control. °Summary °· Take care of the port as told by your health care provider. °· Change your dressing as told by your health care provider. °· Keep all follow-up visits as told by your health care provider. °This information is not intended to replace advice given to you by your health care provider. Make sure you discuss any questions you have with your health care provider. °Document Released: 12/03/2012 Document Revised: 01/04/2016 Document Reviewed: 01/04/2016 °Elsevier Interactive Patient Education © 2017 [employer] °Moderate Conscious Sedation, Adult, Care After °These instructions provide you with information about caring for yourself after your procedure. Your  health care provider may also give you more specific instructions. Your treatment has been planned according to current medical practices, but problems sometimes occur. Call your health care provider if you have any problems or questions after your procedure. °What can I expect after the procedure? °After your procedure, it is common: °· To feel sleepy for several hours. °· To feel clumsy and have poor balance for several hours. °· To have poor judgment for several hours. °· To vomit if you eat too soon. °Follow these instructions at home: °For at least 24 hours after the procedure:  ° °· Do not: °¨ Participate in activities where you could fall or become injured. °¨ Drive. °¨ Use heavy machinery. °¨ Drink alcohol. °¨ Take sleeping pills or medicines that cause drowsiness. °¨ Make important decisions or sign legal documents. °¨ Take care of children on your own. °· Rest. °Eating and drinking  °· Follow the diet recommended by your health care provider. °· If you vomit: °¨ Drink water, juice, or soup when you can drink without vomiting. °¨ Make sure you have little or no nausea before eating solid foods. °General instructions  °· Have a responsible adult stay with you until you are awake and alert. °· Take over-the-counter and prescription medicines only as told by your health care provider. °· If you smoke, do not smoke without supervision. °· Keep all follow-up visits as told by your health care provider. This is important. °Contact a health care provider if: °· You keep feeling nauseous or you keep vomiting. °· You feel light-headed. °· You develop a rash. °· You have a fever. °Get help right away if: °· You have trouble breathing. °This information is not intended to replace advice given to you by your health care provider. Make sure you discuss any questions you have with your health care provider. °Document Released: 12/03/2012 Document Revised: 07/18/2015 Document Reviewed: 06/04/2015 °Elsevier Interactive  Patient Education © 2017 [employer] ° °

## 2016-05-17 ENCOUNTER — Other Ambulatory Visit: Payer: Self-pay | Admitting: Radiation Therapy

## 2016-05-17 DIAGNOSIS — C7949 Secondary malignant neoplasm of other parts of nervous system: Secondary | ICD-10-CM

## 2016-05-20 ENCOUNTER — Other Ambulatory Visit: Payer: Self-pay | Admitting: Oncology

## 2016-05-21 DIAGNOSIS — C7951 Secondary malignant neoplasm of bone: Secondary | ICD-10-CM | POA: Diagnosis not present

## 2016-05-21 DIAGNOSIS — M5416 Radiculopathy, lumbar region: Secondary | ICD-10-CM | POA: Diagnosis not present

## 2016-05-24 ENCOUNTER — Telehealth: Payer: Self-pay | Admitting: Oncology

## 2016-05-24 ENCOUNTER — Other Ambulatory Visit (HOSPITAL_BASED_OUTPATIENT_CLINIC_OR_DEPARTMENT_OTHER): Payer: PPO

## 2016-05-24 ENCOUNTER — Ambulatory Visit (HOSPITAL_BASED_OUTPATIENT_CLINIC_OR_DEPARTMENT_OTHER): Payer: PPO

## 2016-05-24 ENCOUNTER — Ambulatory Visit (HOSPITAL_BASED_OUTPATIENT_CLINIC_OR_DEPARTMENT_OTHER): Payer: PPO | Admitting: Nurse Practitioner

## 2016-05-24 VITALS — BP 126/51 | HR 55 | Temp 98.5°F | Resp 18 | Ht 70.0 in | Wt 257.5 lb

## 2016-05-24 DIAGNOSIS — C642 Malignant neoplasm of left kidney, except renal pelvis: Secondary | ICD-10-CM

## 2016-05-24 DIAGNOSIS — C7951 Secondary malignant neoplasm of bone: Secondary | ICD-10-CM

## 2016-05-24 DIAGNOSIS — G893 Neoplasm related pain (acute) (chronic): Secondary | ICD-10-CM | POA: Diagnosis not present

## 2016-05-24 DIAGNOSIS — C649 Malignant neoplasm of unspecified kidney, except renal pelvis: Secondary | ICD-10-CM

## 2016-05-24 DIAGNOSIS — Z5112 Encounter for antineoplastic immunotherapy: Secondary | ICD-10-CM | POA: Diagnosis not present

## 2016-05-24 LAB — COMPREHENSIVE METABOLIC PANEL
ALT: 18 U/L (ref 0–55)
AST: 13 U/L (ref 5–34)
Albumin: 4 g/dL (ref 3.5–5.0)
Alkaline Phosphatase: 82 U/L (ref 40–150)
Anion Gap: 9 mEq/L (ref 3–11)
BUN: 19.5 mg/dL (ref 7.0–26.0)
CO2: 27 mEq/L (ref 22–29)
Calcium: 10.2 mg/dL (ref 8.4–10.4)
Chloride: 101 mEq/L (ref 98–109)
Creatinine: 0.9 mg/dL (ref 0.7–1.3)
EGFR: 83 mL/min/{1.73_m2} — ABNORMAL LOW (ref >90)
Glucose: 148 mg/dl — ABNORMAL HIGH (ref 70–140)
Potassium: 4.1 mEq/L (ref 3.5–5.1)
Sodium: 137 mEq/L (ref 136–145)
Total Bilirubin: 0.31 mg/dL (ref 0.20–1.20)
Total Protein: 7.1 g/dL (ref 6.4–8.3)

## 2016-05-24 MED ORDER — SODIUM CHLORIDE 0.9% FLUSH
10.0000 mL | INTRAVENOUS | Status: DC | PRN
  Filled 2016-05-24: qty 10

## 2016-05-24 MED ORDER — SODIUM CHLORIDE 0.9 % IV SOLN
240.0000 mg | Freq: Once | INTRAVENOUS | Status: AC
  Administered 2016-05-24: 240 mg via INTRAVENOUS
  Filled 2016-05-24: qty 24

## 2016-05-24 MED ORDER — SODIUM CHLORIDE 0.9 % IV SOLN
Freq: Once | INTRAVENOUS | Status: AC
  Administered 2016-05-24: 16:00:00 via INTRAVENOUS

## 2016-05-24 MED ORDER — LIDOCAINE-PRILOCAINE 2.5-2.5 % EX CREA: 1.0000 "application " | TOPICAL_CREAM | CUTANEOUS | 2 refills | Status: DC | PRN

## 2016-05-24 NOTE — Telephone Encounter (Signed)
Gave patient AVS and calender per 05/24/2016 los.  

## 2016-05-24 NOTE — Progress Notes (Signed)
  [place] OFFICE PROGRESS NOTE   Diagnosis:  Renal cell carcinoma  INTERVAL HISTORY:   Mr. Adduci returns as scheduled. He completed cycle 3 nivolumab 05/11/2016. He denies nausea/vomiting. No mouth sores. No diarrhea. No rash. He denies shortness of breath. No cough. No fever. Back/right leg pain is better. He reports an "injection" 2 days ago which seems to have helped the pain.  Objective:  Vital signs in last 24 hours:  Blood pressure (!) 126/51, pulse (!) 55, temperature 98.5 F (36.9 C), temperature source Oral, resp. rate 18, height 5\' 10"  (1.778 m), weight 257 lb 8 oz (116.8 kg), SpO2 99 %.    HEENT: No thrush or ulcers. Resp: Lungs clear bilaterally. Cardio: Regular rate and rhythm. GI: Abdomen soft and nontender. No hepatomegaly. Vascular: No leg edema. Neuro: Alert and oriented. Port-A-Cath without erythema.    Lab Results:  Lab Results  Component Value Date   WBC 9.1 05/16/2016   HGB 15.2 05/16/2016   HCT 44.9 05/16/2016   MCV 87.2 05/16/2016   PLT 206 05/16/2016   NEUTROABS 5.4 05/16/2016    Imaging:  No results found.  Medications: I have reviewed the patient's current medications.  Assessment/Plan: 1. Metastatic renal cell carcinoma   L4 mass with extraosseous extension, L4 nerve compression  Biopsy of the L4 mass 11/18/2015 confirmed metastatic renal cell carcinoma, clear cell type  CTs of the chest, abdomen, and pelvis 11/18/2015-right lower lobe nodule, expansile lytic lesion at the right 11th rib/costal vertebral junction, left renal mass, expansile lesion involving the L4 vertebra, lytic lesion at the left acetabulum, and a low-attenuation liver lesion  Initiation of SRS to L4 12/02/2015, Completed 12/12/2015  Initiation of Pazopanib11/04/2015  Pazopanibplaced on hold 02/06/2016 secondary to elevated liver enzymes  Pazopanibresumed 03/07/2016 at a dose of 400 mg daily  Pazopanibdiscontinued 03/19/2016 secondary  to elevated liver enzymes  Restaging CTs 04/02/2016-stable left renal mass, decreased soft tissue component associated with the L4 metastasis, increased soft tissue component associated with the right 11th rib metastasis with increased T11 bony destruction, increased sclerosis at the left acetabulum lesion  Cycle 1 nivolumab 04/12/2016  Cycle 2 nivolumab 04/26/2016  Cycle 3 nivolumab 05/11/2016   2. Pain secondary to #1 3. Hypertension 4. Elevated transaminases 02/06/2016-Pazopanibplaced on hold  Liver enzymes normal 03/07/2016 5. Port-A-Cath placement 05/16/2016    Disposition: Mr. Karnes appears stable. He has completed 3 cycles of nivolumab. Plan to proceed with cycle 4 today as scheduled pending the chemistry panel.  He will return for a follow-up visit and cycle 5 in 2 weeks.   The plan is to obtain restaging CT scans after 5 or 6 cycles.    [name] ANP/GNP-BC   05/24/2016  3:11 PM

## 2016-05-24 NOTE — Patient Instructions (Signed)
[facility] Discharge Instructions for Patients Receiving Chemotherapy  Today you received the following chemotherapy agents Nivolumab  To help prevent nausea and vomiting after your treatment, we encourage you to take your nausea medication     If you develop nausea and vomiting that is not controlled by your nausea medication, call the clinic.   BELOW ARE SYMPTOMS THAT SHOULD BE REPORTED IMMEDIATELY:  *FEVER GREATER THAN 100.5 F  *CHILLS WITH OR WITHOUT FEVER  NAUSEA AND VOMITING THAT IS NOT CONTROLLED WITH YOUR NAUSEA MEDICATION  *UNUSUAL SHORTNESS OF BREATH  *UNUSUAL BRUISING OR BLEEDING  TENDERNESS IN MOUTH AND THROAT WITH OR WITHOUT PRESENCE OF ULCERS  *URINARY PROBLEMS  *BOWEL PROBLEMS  UNUSUAL RASH Items with * indicate a potential emergency and should be followed up as soon as possible.  Feel free to call the clinic you have any questions or concerns. The clinic phone number is [phone].  Please show the CHEMO ALERT CARD at check-in to the Emergency Department and triage nurse.   

## 2016-06-03 ENCOUNTER — Other Ambulatory Visit: Payer: Self-pay | Admitting: Oncology

## 2016-06-07 DIAGNOSIS — Z6837 Body mass index (BMI) 37.0-37.9, adult: Secondary | ICD-10-CM | POA: Diagnosis not present

## 2016-06-07 DIAGNOSIS — I10 Essential (primary) hypertension: Secondary | ICD-10-CM | POA: Diagnosis not present

## 2016-06-07 DIAGNOSIS — M5416 Radiculopathy, lumbar region: Secondary | ICD-10-CM | POA: Diagnosis not present

## 2016-06-08 ENCOUNTER — Ambulatory Visit: Payer: PPO

## 2016-06-08 ENCOUNTER — Ambulatory Visit (HOSPITAL_BASED_OUTPATIENT_CLINIC_OR_DEPARTMENT_OTHER): Payer: PPO

## 2016-06-08 ENCOUNTER — Other Ambulatory Visit (HOSPITAL_BASED_OUTPATIENT_CLINIC_OR_DEPARTMENT_OTHER): Payer: PPO

## 2016-06-08 ENCOUNTER — Ambulatory Visit (HOSPITAL_BASED_OUTPATIENT_CLINIC_OR_DEPARTMENT_OTHER): Payer: PPO | Admitting: Nurse Practitioner

## 2016-06-08 ENCOUNTER — Telehealth: Payer: Self-pay | Admitting: Nurse Practitioner

## 2016-06-08 VITALS — BP 129/60 | HR 57 | Temp 98.1°F | Resp 18 | Wt 256.9 lb

## 2016-06-08 DIAGNOSIS — C7951 Secondary malignant neoplasm of bone: Secondary | ICD-10-CM | POA: Diagnosis not present

## 2016-06-08 DIAGNOSIS — C649 Malignant neoplasm of unspecified kidney, except renal pelvis: Secondary | ICD-10-CM

## 2016-06-08 DIAGNOSIS — C642 Malignant neoplasm of left kidney, except renal pelvis: Secondary | ICD-10-CM

## 2016-06-08 DIAGNOSIS — Z5112 Encounter for antineoplastic immunotherapy: Secondary | ICD-10-CM | POA: Diagnosis not present

## 2016-06-08 DIAGNOSIS — C7949 Secondary malignant neoplasm of other parts of nervous system: Secondary | ICD-10-CM

## 2016-06-08 DIAGNOSIS — I10 Essential (primary) hypertension: Secondary | ICD-10-CM

## 2016-06-08 LAB — CBC WITH DIFFERENTIAL/PLATELET
BASO%: 0.5 % (ref 0.0–2.0)
Basophils Absolute: 0 10*3/uL (ref 0.0–0.1)
EOS%: 7.5 % — ABNORMAL HIGH (ref 0.0–7.0)
Eosinophils Absolute: 0.6 10*3/uL — ABNORMAL HIGH (ref 0.0–0.5)
HCT: 42.6 % (ref 38.4–49.9)
HGB: 14.3 g/dL (ref 13.0–17.1)
LYMPH%: 22.3 % (ref 14.0–49.0)
MCH: 29.4 pg (ref 27.2–33.4)
MCHC: 33.6 g/dL (ref 32.0–36.0)
MCV: 87.5 fL (ref 79.3–98.0)
MONO#: 0.7 10*3/uL (ref 0.1–0.9)
MONO%: 9.3 % (ref 0.0–14.0)
NEUT#: 4.7 10*3/uL (ref 1.5–6.5)
NEUT%: 60.4 % (ref 39.0–75.0)
Platelets: 169 10*3/uL (ref 140–400)
RBC: 4.87 10*6/uL (ref 4.20–5.82)
RDW: 13.5 % (ref 11.0–14.6)
WBC: 7.9 10*3/uL (ref 4.0–10.3)
lymph#: 1.8 10*3/uL (ref 0.9–3.3)
nRBC: 0 % (ref 0–0)

## 2016-06-08 LAB — COMPREHENSIVE METABOLIC PANEL
ALT: 18 U/L (ref 0–55)
AST: 13 U/L (ref 5–34)
Albumin: 4 g/dL (ref 3.5–5.0)
Alkaline Phosphatase: 75 U/L (ref 40–150)
Anion Gap: 8 mEq/L (ref 3–11)
BUN: 16.7 mg/dL (ref 7.0–26.0)
CO2: 27 mEq/L (ref 22–29)
Calcium: 10.3 mg/dL (ref 8.4–10.4)
Chloride: 101 mEq/L (ref 98–109)
Creatinine: 0.8 mg/dL (ref 0.7–1.3)
EGFR: 89 mL/min/{1.73_m2} — ABNORMAL LOW (ref >90)
Glucose: 151 mg/dl — ABNORMAL HIGH (ref 70–140)
Potassium: 4 mEq/L (ref 3.5–5.1)
Sodium: 137 mEq/L (ref 136–145)
Total Bilirubin: 0.61 mg/dL (ref 0.20–1.20)
Total Protein: 7.1 g/dL (ref 6.4–8.3)

## 2016-06-08 MED ORDER — SODIUM CHLORIDE 0.9 % IV SOLN
Freq: Once | INTRAVENOUS | Status: AC
  Administered 2016-06-08: 15:00:00 via INTRAVENOUS

## 2016-06-08 MED ORDER — HEPARIN SOD (PORK) LOCK FLUSH 100 UNIT/ML IV SOLN
500.0000 [IU] | Freq: Once | INTRAVENOUS | Status: AC | PRN
  Administered 2016-06-08: 500 [IU]
  Filled 2016-06-08: qty 5

## 2016-06-08 MED ORDER — SODIUM CHLORIDE 0.9% FLUSH
10.0000 mL | INTRAVENOUS | Status: DC | PRN
  Administered 2016-06-08: 10 mL
  Filled 2016-06-08: qty 10

## 2016-06-08 MED ORDER — SODIUM CHLORIDE 0.9 % IV SOLN
240.0000 mg | Freq: Once | INTRAVENOUS | Status: AC
  Administered 2016-06-08: 240 mg via INTRAVENOUS
  Filled 2016-06-08: qty 24

## 2016-06-08 NOTE — Progress Notes (Signed)
  [place] OFFICE PROGRESS NOTE   Diagnosis: Renal cell carcinoma   INTERVAL HISTORY:   Phillip [name] returns as scheduled. He completed cycle 4 nivolumab 05/24/2016. He denies nausea/vomiting. No mouth sores. No diarrhea. No rash. No shortness breath. No cough. No fever. Back pain is significantly better following a spinal injection.  Objective:  Vital signs in last 24 hours:  Blood pressure 129/60, pulse (!) 57, temperature 98.1 F (36.7 C), temperature source Oral, resp. rate 18, weight 256 lb 14.4 oz (116.5 kg), SpO2 99 %.    HEENT: No thrush or ulcers. Resp: Lungs clear bilaterally. Cardio: Regular rate and rhythm. GI: Abdomen soft and nontender. No hepatomegaly. Vascular: No leg edema. Neuro: Alert and oriented.  Skin: No rash. Port-A-Cath without erythema.    Lab Results:  Lab Results  Component Value Date   WBC 7.9 06/08/2016   HGB 14.3 06/08/2016   HCT 42.6 06/08/2016   MCV 87.5 06/08/2016   PLT 169 06/08/2016   NEUTROABS 4.7 06/08/2016    Imaging:  No results found.  Medications: I have reviewed the patient's current medications.  Assessment/Plan: 1. Metastatic renal cell carcinoma   L4 mass with extraosseous extension, L4 nerve compression  Biopsy of the L4 mass 11/18/2015 confirmed metastatic renal cell carcinoma, clear cell type  CTs of the chest, abdomen, and pelvis 11/18/2015-right lower lobe nodule, expansile lytic lesion at the right 11th rib/costal vertebral junction, left renal mass, expansile lesion involving the L4 vertebra, lytic lesion at the left acetabulum, and a low-attenuation liver lesion  Initiation of SRS to L4 12/02/2015, Completed 12/12/2015  Initiation of Pazopanib11/04/2015  Pazopanibplaced on hold 02/06/2016 secondary to elevated liver enzymes  Pazopanibresumed 03/07/2016 at a dose of 400 mg daily  Pazopanibdiscontinued 03/19/2016 secondary to elevated liver enzymes  Restaging CTs 04/02/2016-stable  left renal mass, decreased soft tissue component associated with the L4 metastasis, increased soft tissue component associated with the right 11th rib metastasis with increased T11 bony destruction, increased sclerosis at the left acetabulum lesion  Cycle 1 nivolumab 04/12/2016  Cycle 2 nivolumab 04/26/2016  Cycle 3 nivolumab03/16/2018  Cycle 4 nivolumab 05/24/2016  Cycle 5 nivolumab 06/08/2016   2. Pain secondary to #1 3. Hypertension 4. Elevated transaminases 02/06/2016-Pazopanibplaced on hold  Liver enzymes normal 03/07/2016 5. Port-A-Cath placement 05/16/2016    Disposition: Phillip [name] appears stable. He has completed 4 cycles of nivolumab. Plan to proceed with cycle 5 today as scheduled. The plan is to obtain restaging CT scans after he has completed 6 cycles.  He will return for a follow-up visit in 2 weeks.    [name] ANP/GNP-BC   06/08/2016  2:02 PM

## 2016-06-08 NOTE — Telephone Encounter (Signed)
Appointments scheduled per 4.13.18 LOS. Patient given AVS report and calendars with future scheduled appointments. Patient also  given two bottles of contrast and instructions for CT scan appointments.

## 2016-06-08 NOTE — Patient Instructions (Signed)
[facility] Discharge Instructions for Patients Receiving Chemotherapy  Today you received the following chemotherapy agents Nivolumab.  To help prevent nausea and vomiting after your treatment, we encourage you to take your nausea medication as prescribed.   If you develop nausea and vomiting that is not controlled by your nausea medication, call the clinic.   BELOW ARE SYMPTOMS THAT SHOULD BE REPORTED IMMEDIATELY:  *FEVER GREATER THAN 100.5 F  *CHILLS WITH OR WITHOUT FEVER  NAUSEA AND VOMITING THAT IS NOT CONTROLLED WITH YOUR NAUSEA MEDICATION  *UNUSUAL SHORTNESS OF BREATH  *UNUSUAL BRUISING OR BLEEDING  TENDERNESS IN MOUTH AND THROAT WITH OR WITHOUT PRESENCE OF ULCERS  *URINARY PROBLEMS  *BOWEL PROBLEMS  UNUSUAL RASH Items with * indicate a potential emergency and should be followed up as soon as possible.  Feel free to call the clinic you have any questions or concerns. The clinic phone number is [phone].  Please show the CHEMO ALERT CARD at check-in to the Emergency Department and triage nurse.   

## 2016-06-08 NOTE — Patient Instructions (Signed)
Implanted Port Home Guide An implanted port is a type of central line that is placed under the skin. Central lines are used to provide IV access when treatment or nutrition needs to be given through a person's veins. Implanted ports are used for long-term IV access. An implanted port may be placed because:  You need IV medicine that would be irritating to the small veins in your hands or arms.  You need long-term IV medicines, such as antibiotics.  You need IV nutrition for a long period.  You need frequent blood draws for lab tests.  You need dialysis.  Implanted ports are usually placed in the chest area, but they can also be placed in the upper arm, the abdomen, or the leg. An implanted port has two main parts:  Reservoir. The reservoir is round and will appear as a small, raised area under your skin. The reservoir is the part where a needle is inserted to give medicines or draw blood.  Catheter. The catheter is a thin, flexible tube that extends from the reservoir. The catheter is placed into a large vein. Medicine that is inserted into the reservoir goes into the catheter and then into the vein.  How will I care for my incision site? Do not get the incision site wet. Bathe or shower as directed by your health care provider. How is my port accessed? Special steps must be taken to access the port:  Before the port is accessed, a numbing cream can be placed on the skin. This helps numb the skin over the port site.  Your health care provider uses a sterile technique to access the port. ? Your health care provider must put on a mask and sterile gloves. ? The skin over your port is cleaned carefully with an antiseptic and allowed to dry. ? The port is gently pinched between sterile gloves, and a needle is inserted into the port.  Only "non-coring" port needles should be used to access the port. Once the port is accessed, a blood return should be checked. This helps ensure that the port  is in the vein and is not clogged.  If your port needs to remain accessed for a constant infusion, a clear (transparent) bandage will be placed over the needle site. The bandage and needle will need to be changed every week, or as directed by your health care provider.  Keep the bandage covering the needle clean and dry. Do not get it wet. Follow your health care provider's instructions on how to take a shower or bath while the port is accessed.  If your port does not need to stay accessed, no bandage is needed over the port.  What is flushing? Flushing helps keep the port from getting clogged. Follow your health care provider's instructions on how and when to flush the port. Ports are usually flushed with saline solution or a medicine called heparin. The need for flushing will depend on how the port is used.  If the port is used for intermittent medicines or blood draws, the port will need to be flushed: ? After medicines have been given. ? After blood has been drawn. ? As part of routine maintenance.  If a constant infusion is running, the port may not need to be flushed.  How long will my port stay implanted? The port can stay in for as long as your health care provider thinks it is needed. When it is time for the port to come out, surgery will be   done to remove it. The procedure is similar to the one performed when the port was put in. When should I seek immediate medical care? When you have an implanted port, you should seek immediate medical care if:  You notice a bad smell coming from the incision site.  You have swelling, redness, or drainage at the incision site.  You have more swelling or pain at the port site or the surrounding area.  You have a fever that is not controlled with medicine.  This information is not intended to replace advice given to you by your health care provider. Make sure you discuss any questions you have with your health care provider. Document  Released: 02/12/2005 Document Revised: 07/21/2015 Document Reviewed: 10/20/2012 Elsevier Interactive Patient Education  2017 [employer]  

## 2016-06-17 ENCOUNTER — Other Ambulatory Visit: Payer: Self-pay | Admitting: Oncology

## 2016-06-21 ENCOUNTER — Ambulatory Visit
Admission: RE | Admit: 2016-06-21 | Discharge: 2016-06-21 | Disposition: A | Discharge status: Home / Self Care | Payer: PPO | Source: Ambulatory Visit | Attending: Radiation Oncology | Admitting: Radiation Oncology

## 2016-06-21 DIAGNOSIS — C7949 Secondary malignant neoplasm of other parts of nervous system: Secondary | ICD-10-CM

## 2016-06-21 DIAGNOSIS — C649 Malignant neoplasm of unspecified kidney, except renal pelvis: Secondary | ICD-10-CM | POA: Diagnosis not present

## 2016-06-21 DIAGNOSIS — C7951 Secondary malignant neoplasm of bone: Secondary | ICD-10-CM | POA: Diagnosis not present

## 2016-06-21 MED ORDER — GADOBENATE DIMEGLUMINE 529 MG/ML IV SOLN
20.0000 mL | Freq: Once | INTRAVENOUS | Status: AC | PRN
  Administered 2016-06-21: 20 mL via INTRAVENOUS

## 2016-06-22 ENCOUNTER — Ambulatory Visit (HOSPITAL_BASED_OUTPATIENT_CLINIC_OR_DEPARTMENT_OTHER): Payer: PPO | Admitting: Oncology

## 2016-06-22 ENCOUNTER — Ambulatory Visit (HOSPITAL_BASED_OUTPATIENT_CLINIC_OR_DEPARTMENT_OTHER): Payer: PPO

## 2016-06-22 ENCOUNTER — Ambulatory Visit: Payer: PPO

## 2016-06-22 ENCOUNTER — Other Ambulatory Visit (HOSPITAL_BASED_OUTPATIENT_CLINIC_OR_DEPARTMENT_OTHER): Payer: PPO

## 2016-06-22 VITALS — BP 134/69 | HR 86 | Temp 99.5°F | Resp 18 | Ht 70.0 in | Wt 253.1 lb

## 2016-06-22 DIAGNOSIS — C649 Malignant neoplasm of unspecified kidney, except renal pelvis: Secondary | ICD-10-CM

## 2016-06-22 DIAGNOSIS — C7951 Secondary malignant neoplasm of bone: Secondary | ICD-10-CM

## 2016-06-22 DIAGNOSIS — Z5112 Encounter for antineoplastic immunotherapy: Secondary | ICD-10-CM | POA: Diagnosis not present

## 2016-06-22 DIAGNOSIS — C642 Malignant neoplasm of left kidney, except renal pelvis: Secondary | ICD-10-CM

## 2016-06-22 DIAGNOSIS — G893 Neoplasm related pain (acute) (chronic): Secondary | ICD-10-CM

## 2016-06-22 DIAGNOSIS — Z95828 Presence of other vascular implants and grafts: Secondary | ICD-10-CM

## 2016-06-22 LAB — CBC WITH DIFFERENTIAL/PLATELET
BASO%: 0.4 % (ref 0.0–2.0)
Basophils Absolute: 0.1 10*3/uL (ref 0.0–0.1)
EOS%: 4.5 % (ref 0.0–7.0)
Eosinophils Absolute: 0.6 10*3/uL — ABNORMAL HIGH (ref 0.0–0.5)
HCT: 44.3 % (ref 38.4–49.9)
HGB: 14.7 g/dL (ref 13.0–17.1)
LYMPH%: 16.9 % (ref 14.0–49.0)
MCH: 28.9 pg (ref 27.2–33.4)
MCHC: 33.2 g/dL (ref 32.0–36.0)
MCV: 87.2 fL (ref 79.3–98.0)
MONO#: 1 10*3/uL — ABNORMAL HIGH (ref 0.1–0.9)
MONO%: 8 % (ref 0.0–14.0)
NEUT#: 8.6 10*3/uL — ABNORMAL HIGH (ref 1.5–6.5)
NEUT%: 70.2 % (ref 39.0–75.0)
Platelets: 185 10*3/uL (ref 140–400)
RBC: 5.08 10*6/uL (ref 4.20–5.82)
RDW: 13.4 % (ref 11.0–14.6)
WBC: 12.3 10*3/uL — ABNORMAL HIGH (ref 4.0–10.3)
lymph#: 2.1 10*3/uL (ref 0.9–3.3)

## 2016-06-22 LAB — COMPREHENSIVE METABOLIC PANEL
ALT: 17 U/L (ref 0–55)
AST: 13 U/L (ref 5–34)
Albumin: 4.2 g/dL (ref 3.5–5.0)
Alkaline Phosphatase: 86 U/L (ref 40–150)
Anion Gap: 10 mEq/L (ref 3–11)
BUN: 18 mg/dL (ref 7.0–26.0)
CO2: 27 mEq/L (ref 22–29)
Calcium: 10.1 mg/dL (ref 8.4–10.4)
Chloride: 103 mEq/L (ref 98–109)
Creatinine: 0.8 mg/dL (ref 0.7–1.3)
EGFR: 88 mL/min/{1.73_m2} — ABNORMAL LOW (ref >90)
Glucose: 148 mg/dl — ABNORMAL HIGH (ref 70–140)
Potassium: 4.2 mEq/L (ref 3.5–5.1)
Sodium: 140 mEq/L (ref 136–145)
Total Bilirubin: 0.43 mg/dL (ref 0.20–1.20)
Total Protein: 7.4 g/dL (ref 6.4–8.3)

## 2016-06-22 MED ORDER — HEPARIN SOD (PORK) LOCK FLUSH 100 UNIT/ML IV SOLN
500.0000 [IU] | Freq: Once | INTRAVENOUS | Status: AC | PRN
  Administered 2016-06-22: 500 [IU]
  Filled 2016-06-22: qty 5

## 2016-06-22 MED ORDER — SODIUM CHLORIDE 0.9% FLUSH
10.0000 mL | INTRAVENOUS | Status: AC | PRN
  Administered 2016-06-22: 10 mL via INTRAVENOUS
  Filled 2016-06-22: qty 10

## 2016-06-22 MED ORDER — SODIUM CHLORIDE 0.9 % IV SOLN
Freq: Once | INTRAVENOUS | Status: AC
  Administered 2016-06-22: 11:00:00 via INTRAVENOUS

## 2016-06-22 MED ORDER — SODIUM CHLORIDE 0.9% FLUSH
10.0000 mL | INTRAVENOUS | Status: DC | PRN
  Administered 2016-06-22: 10 mL
  Filled 2016-06-22: qty 10

## 2016-06-22 MED ORDER — SODIUM CHLORIDE 0.9 % IV SOLN
240.0000 mg | Freq: Once | INTRAVENOUS | Status: AC
  Administered 2016-06-22: 240 mg via INTRAVENOUS
  Filled 2016-06-22: qty 24

## 2016-06-22 NOTE — Patient Instructions (Signed)
[facility] Discharge Instructions for Patients Receiving Chemotherapy  Today you received the following chemotherapy agents Nivolumab  To help prevent nausea and vomiting after your treatment, we encourage you to take your nausea medication     If you develop nausea and vomiting that is not controlled by your nausea medication, call the clinic.   BELOW ARE SYMPTOMS THAT SHOULD BE REPORTED IMMEDIATELY:  *FEVER GREATER THAN 100.5 F  *CHILLS WITH OR WITHOUT FEVER  NAUSEA AND VOMITING THAT IS NOT CONTROLLED WITH YOUR NAUSEA MEDICATION  *UNUSUAL SHORTNESS OF BREATH  *UNUSUAL BRUISING OR BLEEDING  TENDERNESS IN MOUTH AND THROAT WITH OR WITHOUT PRESENCE OF ULCERS  *URINARY PROBLEMS  *BOWEL PROBLEMS  UNUSUAL RASH Items with * indicate a potential emergency and should be followed up as soon as possible.  Feel free to call the clinic you have any questions or concerns. The clinic phone number is [phone].  Please show the CHEMO ALERT CARD at check-in to the Emergency Department and triage nurse.   

## 2016-06-22 NOTE — Patient Instructions (Signed)
Implanted Port Home Guide An implanted port is a type of central line that is placed under the skin. Central lines are used to provide IV access when treatment or nutrition needs to be given through a person's veins. Implanted ports are used for long-term IV access. An implanted port may be placed because:  You need IV medicine that would be irritating to the small veins in your hands or arms.  You need long-term IV medicines, such as antibiotics.  You need IV nutrition for a long period.  You need frequent blood draws for lab tests.  You need dialysis.  Implanted ports are usually placed in the chest area, but they can also be placed in the upper arm, the abdomen, or the leg. An implanted port has two main parts:  Reservoir. The reservoir is round and will appear as a small, raised area under your skin. The reservoir is the part where a needle is inserted to give medicines or draw blood.  Catheter. The catheter is a thin, flexible tube that extends from the reservoir. The catheter is placed into a large vein. Medicine that is inserted into the reservoir goes into the catheter and then into the vein.  How will I care for my incision site? Do not get the incision site wet. Bathe or shower as directed by your health care provider. How is my port accessed? Special steps must be taken to access the port:  Before the port is accessed, a numbing cream can be placed on the skin. This helps numb the skin over the port site.  Your health care provider uses a sterile technique to access the port. ? Your health care provider must put on a mask and sterile gloves. ? The skin over your port is cleaned carefully with an antiseptic and allowed to dry. ? The port is gently pinched between sterile gloves, and a needle is inserted into the port.  Only "non-coring" port needles should be used to access the port. Once the port is accessed, a blood return should be checked. This helps ensure that the port  is in the vein and is not clogged.  If your port needs to remain accessed for a constant infusion, a clear (transparent) bandage will be placed over the needle site. The bandage and needle will need to be changed every week, or as directed by your health care provider.  Keep the bandage covering the needle clean and dry. Do not get it wet. Follow your health care provider's instructions on how to take a shower or bath while the port is accessed.  If your port does not need to stay accessed, no bandage is needed over the port.  What is flushing? Flushing helps keep the port from getting clogged. Follow your health care provider's instructions on how and when to flush the port. Ports are usually flushed with saline solution or a medicine called heparin. The need for flushing will depend on how the port is used.  If the port is used for intermittent medicines or blood draws, the port will need to be flushed: ? After medicines have been given. ? After blood has been drawn. ? As part of routine maintenance.  If a constant infusion is running, the port may not need to be flushed.  How long will my port stay implanted? The port can stay in for as long as your health care provider thinks it is needed. When it is time for the port to come out, surgery will be   done to remove it. The procedure is similar to the one performed when the port was put in. When should I seek immediate medical care? When you have an implanted port, you should seek immediate medical care if:  You notice a bad smell coming from the incision site.  You have swelling, redness, or drainage at the incision site.  You have more swelling or pain at the port site or the surrounding area.  You have a fever that is not controlled with medicine.  This information is not intended to replace advice given to you by your health care provider. Make sure you discuss any questions you have with your health care provider. Document  Released: 02/12/2005 Document Revised: 07/21/2015 Document Reviewed: 10/20/2012 Elsevier Interactive Patient Education  2017 [employer]  

## 2016-06-22 NOTE — Progress Notes (Signed)
Phillip [name] 71 y.o. 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            Pain:  1/10 Right leg                                          Oxycodone and Oxymorphone Bowel/Bladder retention or incontinence:No change  Taking Colace and Miralax  Current Decadron regimen, if applicable: No  Numbness or weakness in extremities : Weakness of legs at times.  Received injection in his back one month ago and he is feeling a lot better since the injection                                                          Current Decadron regimen, if applicable: No Fatigue:Having mild fatigue at times. Skin status:No change Nausea/Vomiting:None           Weight: Wt Readings from Last 3 Encounters:  06/25/16 256 lb 12.8 oz (116.5 kg)  06/22/16 253 lb 1.6 oz (114.8 kg)  06/08/16 256 lb 14.4 oz (116.5 kg)             Ambulatory status? Walker? Wheelchair?:              Uses cane and  walker at home as needed has not used over the past week.  Started PT again today.          BP 140/87   Pulse 95   Temp 98.7 F (37.1 C) (Oral)   Resp 18   Ht 5\' 10"  (1.778 m)   Wt 256 lb 12.8 oz (116.5 kg)   SpO2 99%   BMI 36.85 kg/m

## 2016-06-22 NOTE — Progress Notes (Signed)
[place] OFFICE PROGRESS NOTE   Diagnosis: Renal cell carcinoma  INTERVAL HISTORY:   Phillip [name] returns as scheduled. He reports marked improvement in the right but pain since undergoing an injection procedure several weeks ago. He is ambulating without difficulty. He was able to go fishing. He is working on cars. He has malaise and intermittent "hot flashes "in the mornings. No rash or diarrhea.   Objective:  Vital signs in last 24 hours:  Blood pressure 134/69, pulse 86, temperature 99.5 F (37.5 C), temperature source Oral, resp. rate 18, height 5\' 10"  (1.778 m), weight 253 lb 1.6 oz (114.8 kg), SpO2 98 %.    HEENT: No thrush or ulcers Resp: Lungs clear bilaterally Cardio: Regular rate and rhythm GI: No hepatosplenomegaly, nontender Vascular: No leg edema   Lab Results:  Lab Results  Component Value Date   WBC 12.3 (H) 06/22/2016   HGB 14.7 06/22/2016   HCT 44.3 06/22/2016   MCV 87.2 06/22/2016   PLT 185 06/22/2016   NEUTROABS 8.6 (H) 06/22/2016      Imaging:  Mr Lumbar Spine W Wo Contrast  Result Date: 06/21/2016 CLINICAL DATA:  Renal cell carcinoma with spine metastases status post stereotactic radiosurgery. Follow-up. EXAM: MRI LUMBAR SPINE WITHOUT AND WITH CONTRAST TECHNIQUE: Multiplanar and multiecho pulse sequences of the lumbar spine were obtained without and with intravenous contrast. CONTRAST:  58mL MULTIHANCE GADOBENATE DIMEGLUMINE 529 MG/ML IV SOLN COMPARISON:  03/21/2016 FINDINGS: Segmentation: Lowest fully formed intervertebral disc space is designated L5-S1 as on the prior study. Alignment:  Unchanged trace retrolisthesis of L2 on L3 and L3 on L4. Vertebrae: Extensive tumor involving the posterior L4 vertebral body, right pedicle, right L4 articular processes, right L3 inferior articular process, and right L5 superior articular process has not significantly changed. Tumor again appears centrally necrotic with predominantly peripheral  enhancement. Extraosseous tumor does not appear significantly changed, including enhancing epidural soft tissue in the right aspect of the spinal canal at L4. Severe right neural foraminal stenosis at L4-5 from tumor is unchanged, as is moderate right neural foraminal stenosis at L3-4. No new osseous lesions are identified in the lumbar spine. Known tumor involving the posterior right eleventh rib and adjacent right T12 pedicle/ posterior vertebral body is only partially visualized on sagittal sequences. Scattered hemangiomas are again seen, and prominent fatty marrow changes from L3 to L5 are related to radiation therapy. Conus medullaris: Extends to the lower T12 level and appears normal. Enhancing right-sided nerve roots in the canal at L4 persist but are less conspicuous, although this may be due to mild motion artifact (series 12, image 25). Paraspinal and other soft tissues: Similar appearance of edema and enhancement in the right greater than left lumbar paraspinal musculature. Partial visualization of known left upper pole renal malignancy. 2.0 cm short axis retrocaval lymph node has enlarged since a 04/02/2016 abdominal CT (previously 1.2 cm). A 1.6 cm retrocaval lymph node slightly more inferiorly has also enlarged (previously 1.2 cm). 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 Disc bulging, short pedicles, and facet and ligamentum flavum hypertrophy result in moderate spinal stenosis and mild-to-moderate left neural foraminal stenosis at L3-4 and L4-5 (right-sided foraminal stenosis at these levels described above). Disc bulging and facet arthrosis at L5-S1 result in mild left greater than right lateral recess and moderate to severe right and moderate left neural foraminal stenosis, unchanged. IMPRESSION: 1. Unchanged osseous and extraosseous tumor at L4. 2. No evidence of new metastatic disease in  the lumbar spine. Partially visualized known tumor on the right at T11. 3.  Progressive retroperitoneal lymphadenopathy since 04/02/2016. Electronically Signed   By: Logan Bores M.D.   On: 06/21/2016 15:58    Medications: I have reviewed the patient's current medications.  Assessment/Plan: 1. Metastatic renal cell carcinoma   L4 mass with extraosseous extension, L4 nerve compression  Biopsy of the L4 mass 11/18/2015 confirmed metastatic renal cell carcinoma, clear cell type  CTs of the chest, abdomen, and pelvis 11/18/2015-right lower lobe nodule, expansile lytic lesion at the right 11th rib/costal vertebral junction, left renal mass, expansile lesion involving the L4 vertebra, lytic lesion at the left acetabulum, and a low-attenuation liver lesion  Initiation of SRS to L4 12/02/2015, Completed 12/12/2015  Initiation of Pazopanib11/04/2015  Pazopanibplaced on hold 02/06/2016 secondary to elevated liver enzymes  Pazopanibresumed 03/07/2016 at a dose of 400 mg daily  Pazopanibdiscontinued 03/19/2016 secondary to elevated liver enzymes  Restaging CTs 04/02/2016-stable left renal mass, decreased soft tissue component associated with the L4 metastasis, increased soft tissue component associated with the right 11th rib metastasis with increased T11 bony destruction, increased sclerosis at the left acetabulum lesion  Cycle 1 nivolumab 04/12/2016  Cycle 2 nivolumab 04/26/2016  Cycle 3 nivolumab03/16/2018  Cycle 4 nivolumab 05/24/2016  Cycle 5 nivolumab 06/08/2016  MRI lumbar spine 06/21/2016-unchanged tumor at L3, increased size of retroperitoneal lymph nodes compared to a CT from 04/02/2016  Cycle 6 nivolumab  06/22/2016   2. Pain secondary to #1-Improved  3. Hypertension 4. Elevated transaminases 02/06/2016-Pazopanibplaced on hold  Liver enzymes normal 03/07/2016 5. Port-A-Cath placement 05/16/2016   Disposition:  Phillip [name] has an improved performance status. His pain is improved. The plan is to continue  Nivolumab . He will undergo  a restaging CT evaluation prior to office visit in 2 weeks.  15 minutes were spent with the patient today. The majority of the time was used for counseling and coordination of care.  Betsy Coder, MD  06/22/2016  10:36 AM

## 2016-06-25 ENCOUNTER — Encounter: Payer: Self-pay | Admitting: Radiation Oncology

## 2016-06-25 ENCOUNTER — Ambulatory Visit
Admission: RE | Admit: 2016-06-25 | Discharge: 2016-06-25 | Disposition: A | Discharge status: Home / Self Care | Payer: PPO | Source: Ambulatory Visit | Attending: Radiation Oncology | Admitting: Radiation Oncology

## 2016-06-25 VITALS — BP 140/87 | HR 95 | Temp 98.7°F | Resp 18 | Ht 70.0 in | Wt 256.8 lb

## 2016-06-25 DIAGNOSIS — C7949 Secondary malignant neoplasm of other parts of nervous system: Secondary | ICD-10-CM

## 2016-06-25 DIAGNOSIS — C641 Malignant neoplasm of right kidney, except renal pelvis: Secondary | ICD-10-CM | POA: Insufficient documentation

## 2016-06-25 DIAGNOSIS — R531 Weakness: Secondary | ICD-10-CM | POA: Diagnosis not present

## 2016-06-25 DIAGNOSIS — C7951 Secondary malignant neoplasm of bone: Secondary | ICD-10-CM | POA: Diagnosis not present

## 2016-06-25 DIAGNOSIS — M549 Dorsalgia, unspecified: Secondary | ICD-10-CM | POA: Insufficient documentation

## 2016-06-25 DIAGNOSIS — M6281 Muscle weakness (generalized): Secondary | ICD-10-CM | POA: Diagnosis not present

## 2016-06-25 DIAGNOSIS — I10 Essential (primary) hypertension: Secondary | ICD-10-CM | POA: Diagnosis not present

## 2016-06-25 DIAGNOSIS — Z923 Personal history of irradiation: Secondary | ICD-10-CM | POA: Diagnosis not present

## 2016-06-25 DIAGNOSIS — Z85528 Personal history of other malignant neoplasm of kidney: Secondary | ICD-10-CM | POA: Diagnosis not present

## 2016-06-25 DIAGNOSIS — C649 Malignant neoplasm of unspecified kidney, except renal pelvis: Secondary | ICD-10-CM

## 2016-06-25 DIAGNOSIS — M5441 Lumbago with sciatica, right side: Secondary | ICD-10-CM

## 2016-06-25 DIAGNOSIS — G8929 Other chronic pain: Secondary | ICD-10-CM

## 2016-06-26 NOTE — Progress Notes (Signed)
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  Interval Since Last Radiation:  6 months  12/02/2015 to 10/16/2017SBRT Treatment: The L4 Right spinal was treated to 50 Gy in 5 fractions at 10 Gy per fraction.  Narrative:  The patient returns today for routine follow-up. In summary this is a pleasant 71 y.o. 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 He received SBRT treatment to the spine and is under the care of Dr. [name] in medical oncology. He is currently receiving immunotherapy and due for repeat scans next week. His MRI on 06/21/16 revealed stability of his treated disease and during conference radiology felt that the neural foramen at the L4 site had more of a fat plane indicating improvement of disease at that site. There were two retroperitoneal nodes 2 cm and 1.6 cm that had increased in size since his last imaging. He comes today to discuss his MRI .  Past Medical History:  Past Medical History:  Diagnosis Date  . Hypertension     Past Surgical History: Past Surgical History:  Procedure Laterality Date  . IR GENERIC HISTORICAL  11/18/2015   IR FLUORO GUIDED NEEDLE PLC ASPIRATION/INJECTION LOC 11/18/2015 [name], MD [facility]  . IR GENERIC HISTORICAL  05/16/2016   IR FLUORO GUIDE PORT INSERTION RIGHT 05/16/2016 [name], MD [facility]  . IR GENERIC HISTORICAL  05/16/2016   IR US GUIDE VASC ACCESS RIGHT 05/16/2016 [name], MD [facility]    Social History:  Social History   Social History  . Marital status: Single    Spouse name: N/A  . Number of children: N/A  . Years of  education: N/A   Occupational History  . Not on file.   Social History Main Topics  . Smoking status: Never Smoker  . Smokeless tobacco: Never Used  . Alcohol use 0.6 oz/week    1 Glasses of wine per week  . Drug use: Yes    Types: Marijuana  . Sexual activity: Not Currently   Other Topics Concern  . Not on file   Social History Narrative  . No narrative on file    Family History: Family History  Problem Relation Age of Onset  . Cancer Grandchild     lymphoma    ALLERGIES:  has No Known Allergies.  Meds: Current Outpatient Prescriptions  Medication Sig Dispense Refill  . docusate sodium (COLACE) 100 MG capsule Take 100 mg by mouth 2 (two) times daily.    [name] lidocaine-prilocaine (EMLA) cream Apply 1 application topically as needed. Apply to portacath 1 hour prior to use 30 g 2  . LORazepam (ATIVAN) 0.5 MG tablet 1 tablet po 30 minutes prior to radiation or MRI 30 tablet 0  . losartan-hydrochlorothiazide (HYZAAR) 100-12.5 MG tablet Take 1 tablet by mouth daily.     [name] oxycodone (OXY-IR) 5 MG capsule Take 5 mg by mouth every 4 (four) hours as needed.    [name] oxymorphone (OPANA ER) 10 MG 12 hr tablet Take 10 mg by mouth every 12 (twelve) hours.    . polyethylene glycol (MIRALAX / GLYCOLAX) packet Take 17  g by mouth daily.     No current facility-administered medications for this encounter.    Facility-Administered Medications Ordered in Other Encounters  Medication Dose Route Frequency Provider Last Rate Last Dose  . sodium chloride flush (NS) 0.9 % injection 10 mL  10 mL Intravenous PRN [name], MD   10 mL at 06/22/16 4098    Physical Findings:  height is 5\' 10"  (1.778 m) and weight is 256 lb 12.8 oz (116.5 kg). His oral temperature is 98.7 F (37.1 C). His blood pressure is 140/87 and his pulse is 95. His respiration is 18 and oxygen saturation is 99%.  In general this is a well appearing caucasian male in no acute distress. He's alert and oriented x4 and  appropriate throughout the examination. Cardiopulmonary assessment is negative for acute distress and he exhibits normal effort. He is able to ambulate without grimacing and appears in much better spirits.  Lab Findings: Lab Results  Component Value Date   WBC 12.3 (H) 06/22/2016   HGB 14.7 06/22/2016   HCT 44.3 06/22/2016   MCV 87.2 06/22/2016   PLT 185 06/22/2016     Radiographic Findings: Mr Lumbar Spine W Wo Contrast  Result Date: 06/21/2016 CLINICAL DATA:  Renal cell carcinoma with spine metastases status post stereotactic radiosurgery. Follow-up. EXAM: MRI LUMBAR SPINE WITHOUT AND WITH CONTRAST TECHNIQUE: Multiplanar and multiecho pulse sequences of the lumbar spine were obtained without and with intravenous contrast. CONTRAST:  45mL MULTIHANCE GADOBENATE DIMEGLUMINE 529 MG/ML IV SOLN COMPARISON:  03/21/2016 FINDINGS: Segmentation: Lowest fully formed intervertebral disc space is designated L5-S1 as on the prior study. Alignment:  Unchanged trace retrolisthesis of L2 on L3 and L3 on L4. Vertebrae: Extensive tumor involving the posterior L4 vertebral body, right pedicle, right L4 articular processes, right L3 inferior articular process, and right L5 superior articular process has not significantly changed. Tumor again appears centrally necrotic with predominantly peripheral enhancement. Extraosseous tumor does not appear significantly changed, including enhancing epidural soft tissue in the right aspect of the spinal canal at L4. Severe right neural foraminal stenosis at L4-5 from tumor is unchanged, as is moderate right neural foraminal stenosis at L3-4. No new osseous lesions are identified in the lumbar spine. Known tumor involving the posterior right eleventh rib and adjacent right T12 pedicle/ posterior vertebral body is only partially visualized on sagittal sequences. Scattered hemangiomas are again seen, and prominent fatty marrow changes from L3 to L5 are related to radiation therapy.  Conus medullaris: Extends to the lower T12 level and appears normal. Enhancing right-sided nerve roots in the canal at L4 persist but are less conspicuous, although this may be due to mild motion artifact (series 12, image 25). Paraspinal and other soft tissues: Similar appearance of edema and enhancement in the right greater than left lumbar paraspinal musculature. Partial visualization of known left upper pole renal malignancy. 2.0 cm short axis retrocaval lymph node has enlarged since a 04/02/2016 abdominal CT (previously 1.2 cm). A 1.6 cm retrocaval lymph node slightly more inferiorly has also enlarged (previously 1.2 cm). 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 Disc bulging, short pedicles, and facet and ligamentum flavum hypertrophy result in moderate spinal stenosis and mild-to-moderate left neural foraminal stenosis at L3-4 and L4-5 (right-sided foraminal stenosis at these levels described above). Disc bulging and facet arthrosis at L5-S1 result in mild left greater than right lateral recess and moderate to severe right and moderate left neural foraminal stenosis, unchanged. IMPRESSION: 1. Unchanged osseous  and extraosseous tumor at L4. 2. No evidence of new metastatic disease in the lumbar spine. Partially visualized known tumor on the right at T11. 3. Progressive retroperitoneal lymphadenopathy since 04/02/2016. Electronically Signed   By: Logan Bores M.D.   On: 06/21/2016 15:58    Impression/Plan: 1. 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 The patient's disease appears to be stable to improved on MRI. We will repeat MRI in 3 months for serial surveillance. He will call sooner if he develops any progressive low back pain or neurologic symptoms. 2. Intractable back pain. The patient is feeling much better since having a spinal injection with Dr. [name]. We will follow this expectantly and he will also continue with  physical therapy.     [name], PAC

## 2016-06-28 DIAGNOSIS — M6281 Muscle weakness (generalized): Secondary | ICD-10-CM | POA: Diagnosis not present

## 2016-06-28 DIAGNOSIS — R531 Weakness: Secondary | ICD-10-CM | POA: Diagnosis not present

## 2016-07-01 ENCOUNTER — Other Ambulatory Visit: Payer: Self-pay | Admitting: Oncology

## 2016-07-02 DIAGNOSIS — M6281 Muscle weakness (generalized): Secondary | ICD-10-CM | POA: Diagnosis not present

## 2016-07-02 DIAGNOSIS — R531 Weakness: Secondary | ICD-10-CM | POA: Diagnosis not present

## 2016-07-03 ENCOUNTER — Other Ambulatory Visit: Payer: Self-pay | Admitting: *Deleted

## 2016-07-03 DIAGNOSIS — C649 Malignant neoplasm of unspecified kidney, except renal pelvis: Secondary | ICD-10-CM

## 2016-07-04 ENCOUNTER — Encounter (HOSPITAL_COMMUNITY): Payer: Self-pay

## 2016-07-04 ENCOUNTER — Ambulatory Visit (HOSPITAL_COMMUNITY)
Admission: RE | Admit: 2016-07-04 | Discharge: 2016-07-04 | Disposition: A | Discharge status: Home / Self Care | Payer: PPO | Source: Ambulatory Visit | Attending: Nurse Practitioner | Admitting: Nurse Practitioner

## 2016-07-04 DIAGNOSIS — C642 Malignant neoplasm of left kidney, except renal pelvis: Secondary | ICD-10-CM | POA: Diagnosis not present

## 2016-07-04 DIAGNOSIS — C7951 Secondary malignant neoplasm of bone: Secondary | ICD-10-CM | POA: Insufficient documentation

## 2016-07-04 DIAGNOSIS — R918 Other nonspecific abnormal finding of lung field: Secondary | ICD-10-CM | POA: Diagnosis not present

## 2016-07-04 DIAGNOSIS — C7949 Secondary malignant neoplasm of other parts of nervous system: Secondary | ICD-10-CM

## 2016-07-04 DIAGNOSIS — R591 Generalized enlarged lymph nodes: Secondary | ICD-10-CM | POA: Diagnosis not present

## 2016-07-04 DIAGNOSIS — E279 Disorder of adrenal gland, unspecified: Secondary | ICD-10-CM | POA: Diagnosis not present

## 2016-07-04 DIAGNOSIS — D7389 Other diseases of spleen: Secondary | ICD-10-CM | POA: Insufficient documentation

## 2016-07-04 DIAGNOSIS — C649 Malignant neoplasm of unspecified kidney, except renal pelvis: Secondary | ICD-10-CM

## 2016-07-04 MED ORDER — HEPARIN SOD (PORK) LOCK FLUSH 100 UNIT/ML IV SOLN
INTRAVENOUS | Status: AC
  Filled 2016-07-04: qty 5

## 2016-07-04 MED ORDER — IOPAMIDOL (ISOVUE-300) INJECTION 61%
INTRAVENOUS | Status: AC
Start: 2016-07-04 — End: 2016-07-04
  Filled 2016-07-04: qty 100

## 2016-07-04 MED ORDER — IOPAMIDOL (ISOVUE-300) INJECTION 61%
100.0000 mL | Freq: Once | INTRAVENOUS | Status: AC | PRN
  Administered 2016-07-04: 100 mL via INTRAVENOUS

## 2016-07-04 MED ORDER — HEPARIN SOD (PORK) LOCK FLUSH 100 UNIT/ML IV SOLN: 500.0000 [IU] | Freq: Once | INTRAVENOUS | Status: DC

## 2016-07-05 DIAGNOSIS — R531 Weakness: Secondary | ICD-10-CM | POA: Diagnosis not present

## 2016-07-05 DIAGNOSIS — M6281 Muscle weakness (generalized): Secondary | ICD-10-CM | POA: Diagnosis not present

## 2016-07-06 ENCOUNTER — Other Ambulatory Visit (HOSPITAL_BASED_OUTPATIENT_CLINIC_OR_DEPARTMENT_OTHER): Payer: PPO

## 2016-07-06 ENCOUNTER — Ambulatory Visit: Payer: PPO

## 2016-07-06 ENCOUNTER — Encounter: Payer: Self-pay | Admitting: Oncology

## 2016-07-06 ENCOUNTER — Ambulatory Visit (HOSPITAL_BASED_OUTPATIENT_CLINIC_OR_DEPARTMENT_OTHER): Payer: PPO

## 2016-07-06 ENCOUNTER — Ambulatory Visit (HOSPITAL_BASED_OUTPATIENT_CLINIC_OR_DEPARTMENT_OTHER): Payer: PPO | Admitting: Oncology

## 2016-07-06 ENCOUNTER — Telehealth: Payer: Self-pay | Admitting: Oncology

## 2016-07-06 VITALS — BP 129/48 | HR 51 | Temp 98.5°F | Resp 18 | Ht 70.0 in | Wt 254.3 lb

## 2016-07-06 DIAGNOSIS — C649 Malignant neoplasm of unspecified kidney, except renal pelvis: Secondary | ICD-10-CM

## 2016-07-06 DIAGNOSIS — C7949 Secondary malignant neoplasm of other parts of nervous system: Secondary | ICD-10-CM

## 2016-07-06 DIAGNOSIS — Z5112 Encounter for antineoplastic immunotherapy: Secondary | ICD-10-CM | POA: Diagnosis not present

## 2016-07-06 DIAGNOSIS — C642 Malignant neoplasm of left kidney, except renal pelvis: Secondary | ICD-10-CM

## 2016-07-06 DIAGNOSIS — Z79899 Other long term (current) drug therapy: Secondary | ICD-10-CM | POA: Diagnosis not present

## 2016-07-06 DIAGNOSIS — C641 Malignant neoplasm of right kidney, except renal pelvis: Secondary | ICD-10-CM | POA: Diagnosis not present

## 2016-07-06 DIAGNOSIS — Z95828 Presence of other vascular implants and grafts: Secondary | ICD-10-CM

## 2016-07-06 DIAGNOSIS — C7951 Secondary malignant neoplasm of bone: Secondary | ICD-10-CM

## 2016-07-06 LAB — CBC WITH DIFFERENTIAL/PLATELET
BASO%: 0.5 % (ref 0.0–2.0)
Basophils Absolute: 0 10*3/uL (ref 0.0–0.1)
EOS%: 6 % (ref 0.0–7.0)
Eosinophils Absolute: 0.5 10*3/uL (ref 0.0–0.5)
HCT: 43.3 % (ref 38.4–49.9)
HGB: 14.1 g/dL (ref 13.0–17.1)
LYMPH%: 24.7 % (ref 14.0–49.0)
MCH: 29 pg (ref 27.2–33.4)
MCHC: 32.6 g/dL (ref 32.0–36.0)
MCV: 88.9 fL (ref 79.3–98.0)
MONO#: 0.8 10*3/uL (ref 0.1–0.9)
MONO%: 10.7 % (ref 0.0–14.0)
NEUT#: 4.4 10*3/uL (ref 1.5–6.5)
NEUT%: 58.1 % (ref 39.0–75.0)
Platelets: 184 10*3/uL (ref 140–400)
RBC: 4.87 10*6/uL (ref 4.20–5.82)
RDW: 13.6 % (ref 11.0–14.6)
WBC: 7.6 10*3/uL (ref 4.0–10.3)
lymph#: 1.9 10*3/uL (ref 0.9–3.3)

## 2016-07-06 LAB — COMPREHENSIVE METABOLIC PANEL
ALT: 18 U/L (ref 0–55)
AST: 13 U/L (ref 5–34)
Albumin: 4 g/dL (ref 3.5–5.0)
Alkaline Phosphatase: 78 U/L (ref 40–150)
Anion Gap: 9 mEq/L (ref 3–11)
BUN: 11 mg/dL (ref 7.0–26.0)
CO2: 26 mEq/L (ref 22–29)
Calcium: 9.7 mg/dL (ref 8.4–10.4)
Chloride: 103 mEq/L (ref 98–109)
Creatinine: 0.8 mg/dL (ref 0.7–1.3)
EGFR: 90 mL/min/{1.73_m2} — ABNORMAL LOW (ref >90)
Glucose: 122 mg/dl (ref 70–140)
Potassium: 4.2 mEq/L (ref 3.5–5.1)
Sodium: 138 mEq/L (ref 136–145)
Total Bilirubin: 0.53 mg/dL (ref 0.20–1.20)
Total Protein: 7 g/dL (ref 6.4–8.3)

## 2016-07-06 LAB — TSH: TSH: 0.189 m(IU)/L — ABNORMAL LOW (ref 0.320–4.118)

## 2016-07-06 MED ORDER — SODIUM CHLORIDE 0.9 % IV SOLN
240.0000 mg | Freq: Once | INTRAVENOUS | Status: AC
  Administered 2016-07-06: 240 mg via INTRAVENOUS
  Filled 2016-07-06: qty 24

## 2016-07-06 MED ORDER — SODIUM CHLORIDE 0.9 % IV SOLN
Freq: Once | INTRAVENOUS | Status: AC
  Administered 2016-07-06: 13:00:00 via INTRAVENOUS

## 2016-07-06 MED ORDER — SODIUM CHLORIDE 0.9% FLUSH
10.0000 mL | Freq: Once | INTRAVENOUS | Status: AC
  Administered 2016-07-06: 10 mL
  Filled 2016-07-06: qty 10

## 2016-07-06 MED ORDER — HEPARIN SOD (PORK) LOCK FLUSH 100 UNIT/ML IV SOLN
500.0000 [IU] | Freq: Once | INTRAVENOUS | Status: AC | PRN
  Administered 2016-07-06: 500 [IU]
  Filled 2016-07-06: qty 5

## 2016-07-06 MED ORDER — SODIUM CHLORIDE 0.9% FLUSH
10.0000 mL | INTRAVENOUS | Status: DC | PRN
  Administered 2016-07-06: 10 mL
  Filled 2016-07-06: qty 10

## 2016-07-06 NOTE — Progress Notes (Signed)
Received staff message to schedule appointment with patient with financial concerns regarding [employer]. Called patient and left message with my contact name and number.

## 2016-07-06 NOTE — Progress Notes (Signed)
[place] OFFICE PROGRESS NOTE   Diagnosis: Renal cell carcinoma  INTERVAL HISTORY:   Mr. Kotowski returns as scheduled. He reports stable pain in the right leg. He was last treated with nivolumab on 06/22/2016. No rash or diarrhea. No new complaint.  Objective:  Vital signs in last 24 hours:  Blood pressure (!) 129/48, pulse (!) 51, temperature 98.5 F (36.9 C), temperature source Oral, resp. rate 18, height 5\' 10"  (1.778 m), weight 254 lb 4.8 oz (115.3 kg), SpO2 98 %.    HEENT: No thrush or ulcers Resp: Lungs clear bilaterally Cardio: Regular rate and rhythm GI: No hepatosplenomegaly, nontender, no mass Vascular: No leg edema   Portacath/PICC-without erythema  Lab Results:  Lab Results  Component Value Date   WBC 7.6 07/06/2016   HGB 14.1 07/06/2016   HCT 43.3 07/06/2016   MCV 88.9 07/06/2016   PLT 184 07/06/2016   NEUTROABS 4.4 07/06/2016    CMP     Component Value Date/Time   NA 138 07/06/2016 1029   K 4.2 07/06/2016 1029   CO2 26 07/06/2016 1029   GLUCOSE 122 07/06/2016 1029   BUN 11.0 07/06/2016 1029   CREATININE 0.8 07/06/2016 1029   CALCIUM 9.7 07/06/2016 1029   PROT 7.0 07/06/2016 1029   ALBUMIN 4.0 07/06/2016 1029   AST 13 07/06/2016 1029   ALT 18 07/06/2016 1029   ALKPHOS 78 07/06/2016 1029   BILITOT 0.53 07/06/2016 1029      Imaging:  Ct Abdomen Pelvis W Wo Contrast  Result Date: 07/04/2016 CLINICAL DATA:  Renal cell carcinoma. EXAM: CT CHEST WITH CONTRAST CT ABDOMEN AND PELVIS WITH AND WITHOUT CONTRAST TECHNIQUE: Multidetector CT imaging of the chest was performed during intravenous contrast administration. Multidetector CT imaging of the abdomen and pelvis was performed following the standard protocol before and during bolus administration of intravenous contrast. CONTRAST:  192mL ISOVUE-300 IOPAMIDOL (ISOVUE-300) INJECTION 61% COMPARISON:  None. FINDINGS: CT CHEST FINDINGS Cardiovascular: The heart size is normal. No  pericardial effusion. Coronary artery calcification is noted. Atherosclerotic calcification is noted in the wall of the thoracic aorta. Right Port-A-Cath tip at the SVC/RA junction. Mediastinum/Nodes: Stable bilateral posterior thyroid nodules. No mediastinal lymphadenopathy. 2.0 cm short axis inferior left hilar lymph node has progressed from 1.1 cm previously. No other mediastinal or hilar lymphadenopathy. There is no axillary lymphadenopathy. The esophagus has normal imaging features. Lungs/Pleura: 9 mm right lower lobe pulmonary nodule (image 85 series 9) is stable. Miniscule 1-2 mm right lower lobe nodule (image 81) is unchanged in the interval. No new or progressive pulmonary nodule or mass. No focal airspace consolidation. No pulmonary edema or pleural effusion. Musculoskeletal: Expansile lesion medial right eleventh rib extending into the right T11 pedicle is stable. CT ABDOMEN AND PELVIS FINDINGS Hepatobiliary: 16 mm well-defined low-density lesion central left liver is unchanged. Liver otherwise unremarkable. Tiny calcified gallstone. No intrahepatic or extrahepatic biliary dilation Pancreas: No focal mass lesion. No dilatation of the main duct. No intraparenchymal cyst. No peripancreatic edema. Spleen: No splenomegaly. Stable 3.6 cm hypervascular lesion anterior spleen. Adrenals/Urinary Tract: Right adrenal lesion has progressed in the interval all measuring 1.8 x 2.2 cm today compared to 0.9 x 1.8 cm previously. Left adrenal gland unremarkable. Right kidney unremarkable. Necrotic left renal mass measures 5.7 x 5.2 cm today compared to 5.4 x 4.9 cm previously. Small exophytic cyst lower pole left kidney is stable. No evidence for hydroureter. The urinary bladder appears normal for the degree of distention. Stomach/Bowel: Stomach is nondistended.  No gastric wall thickening. No evidence of outlet obstruction. Duodenum is normally positioned as is the ligament of Treitz. No small bowel wall thickening. No  small bowel dilatation. The terminal ileum is normal. The appendix is normal. No gross colonic mass. No colonic wall thickening. No substantial diverticular change. Vascular/Lymphatic: Abnormal soft tissue attenuation around the aorta, encasing the inferior mesenteric artery is not substantially changed since prior study. 1.2 cm retrocaval lymph node on the prior study has progressed now measures 2.5 cm. A second retrocaval lymph node seen on image 127 of series 7 measures 1.7 cm in short axis today compared to 1.2 cm previously. 9 mm short axis left external iliac lymph node has progressed. Reproductive: Prostate gland is enlarged. Other: No intraperitoneal free fluid. Musculoskeletal: 3.2 cm soft tissue lesion with bony destruction in the anterior left acetabulum has progressed from 1.9 cm previously. Abnormal soft tissue can be seen extending into the joint space. The posterior left acetabular lesion appears stable in the interval. Destructive lesion in the right aspect of the L4 vertebral body is again noted without substantial change. IMPRESSION: A 1. Interval progression of exophytic necrotic left renal mass consistent with renal cell carcinoma. 2. Interval progression of left hilar and abdominal retroperitoneal lymphadenopathy, consistent with metastatic disease. 3. Multiple lytic bone metastases, some of which are stable although the lesion in the anterior left acetabulum has clearly progressed in the interval. 4. Interval progression of right hypervascular adrenal nodule, consistent with metastatic involvement. 5. Stable tiny bilateral pulmonary nodules. 6. Stable appearance 3.6 cm hypervascular lesion anterior spleen. Electronically Signed   By: Misty Stanley M.D.   On: 07/04/2016 13:54   Ct Chest W Contrast  Result Date: 07/04/2016 CLINICAL DATA:  Renal cell carcinoma. EXAM: CT CHEST WITH CONTRAST CT ABDOMEN AND PELVIS WITH AND WITHOUT CONTRAST TECHNIQUE: Multidetector CT imaging of the chest was  performed during intravenous contrast administration. Multidetector CT imaging of the abdomen and pelvis was performed following the standard protocol before and during bolus administration of intravenous contrast. CONTRAST:  148mL ISOVUE-300 IOPAMIDOL (ISOVUE-300) INJECTION 61% COMPARISON:  None. FINDINGS: CT CHEST FINDINGS Cardiovascular: The heart size is normal. No pericardial effusion. Coronary artery calcification is noted. Atherosclerotic calcification is noted in the wall of the thoracic aorta. Right Port-A-Cath tip at the SVC/RA junction. Mediastinum/Nodes: Stable bilateral posterior thyroid nodules. No mediastinal lymphadenopathy. 2.0 cm short axis inferior left hilar lymph node has progressed from 1.1 cm previously. No other mediastinal or hilar lymphadenopathy. There is no axillary lymphadenopathy. The esophagus has normal imaging features. Lungs/Pleura: 9 mm right lower lobe pulmonary nodule (image 85 series 9) is stable. Miniscule 1-2 mm right lower lobe nodule (image 81) is unchanged in the interval. No new or progressive pulmonary nodule or mass. No focal airspace consolidation. No pulmonary edema or pleural effusion. Musculoskeletal: Expansile lesion medial right eleventh rib extending into the right T11 pedicle is stable. CT ABDOMEN AND PELVIS FINDINGS Hepatobiliary: 16 mm well-defined low-density lesion central left liver is unchanged. Liver otherwise unremarkable. Tiny calcified gallstone. No intrahepatic or extrahepatic biliary dilation Pancreas: No focal mass lesion. No dilatation of the main duct. No intraparenchymal cyst. No peripancreatic edema. Spleen: No splenomegaly. Stable 3.6 cm hypervascular lesion anterior spleen. Adrenals/Urinary Tract: Right adrenal lesion has progressed in the interval all measuring 1.8 x 2.2 cm today compared to 0.9 x 1.8 cm previously. Left adrenal gland unremarkable. Right kidney unremarkable. Necrotic left renal mass measures 5.7 x 5.2 cm today compared to 5.4  x 4.9 cm  previously. Small exophytic cyst lower pole left kidney is stable. No evidence for hydroureter. The urinary bladder appears normal for the degree of distention. Stomach/Bowel: Stomach is nondistended. No gastric wall thickening. No evidence of outlet obstruction. Duodenum is normally positioned as is the ligament of Treitz. No small bowel wall thickening. No small bowel dilatation. The terminal ileum is normal. The appendix is normal. No gross colonic mass. No colonic wall thickening. No substantial diverticular change. Vascular/Lymphatic: Abnormal soft tissue attenuation around the aorta, encasing the inferior mesenteric artery is not substantially changed since prior study. 1.2 cm retrocaval lymph node on the prior study has progressed now measures 2.5 cm. A second retrocaval lymph node seen on image 127 of series 7 measures 1.7 cm in short axis today compared to 1.2 cm previously. 9 mm short axis left external iliac lymph node has progressed. Reproductive: Prostate gland is enlarged. Other: No intraperitoneal free fluid. Musculoskeletal: 3.2 cm soft tissue lesion with bony destruction in the anterior left acetabulum has progressed from 1.9 cm previously. Abnormal soft tissue can be seen extending into the joint space. The posterior left acetabular lesion appears stable in the interval. Destructive lesion in the right aspect of the L4 vertebral body is again noted without substantial change. IMPRESSION: A 1. Interval progression of exophytic necrotic left renal mass consistent with renal cell carcinoma. 2. Interval progression of left hilar and abdominal retroperitoneal lymphadenopathy, consistent with metastatic disease. 3. Multiple lytic bone metastases, some of which are stable although the lesion in the anterior left acetabulum has clearly progressed in the interval. 4. Interval progression of right hypervascular adrenal nodule, consistent with metastatic involvement. 5. Stable tiny bilateral  pulmonary nodules. 6. Stable appearance 3.6 cm hypervascular lesion anterior spleen. Electronically Signed   By: Misty Stanley M.D.   On: 07/04/2016 13:54    Medications: I have reviewed the patient's current medications.  Assessment/Plan: 1. Metastatic renal cell carcinoma   L4 mass with extraosseous extension, L4 nerve compression  Biopsy of the L4 mass 11/18/2015 confirmed metastatic renal cell carcinoma, clear cell type  CTs of the chest, abdomen, and pelvis 11/18/2015-right lower lobe nodule, expansile lytic lesion at the right 11th rib/costal vertebral junction, left renal mass, expansile lesion involving the L4 vertebra, lytic lesion at the left acetabulum, and a low-attenuation liver lesion  Initiation of SRS to L4 12/02/2015, Completed 12/12/2015  Initiation of Pazopanib11/04/2015  Pazopanibplaced on hold 02/06/2016 secondary to elevated liver enzymes  Pazopanibresumed 03/07/2016 at a dose of 400 mg daily  Pazopanibdiscontinued 03/19/2016 secondary to elevated liver enzymes  Restaging CTs 04/02/2016-stable left renal mass, decreased soft tissue component associated with the L4 metastasis, increased soft tissue component associated with the right 11th rib metastasis with increased T11 bony destruction, increased sclerosis at the left acetabulum lesion  Cycle 1 nivolumab 04/12/2016  Cycle 2 nivolumab 04/26/2016  Cycle 3 nivolumab03/16/2018  Cycle 4 nivolumab 05/24/2016  Cycle 5 nivolumab 06/08/2016  MRI lumbar spine 06/21/2016-unchanged tumor at L3, increased size of retroperitoneal lymph nodes compared to a CT from 04/02/2016  Cycle 6 nivolumab  06/22/2016  CTs chest, abdomen, and pelvis 07/04/2016-enlargement of the left renal mass, right adrenal nodule, left hilar and peritoneal lymph nodes, enlargement of left acetabular lesion. Stable lung nodules.  Cycle 7 nivolumab 07/06/2016    2. Pain secondary to #1-Improved  3. Hypertension 4. Elevated  transaminases 02/06/2016-Pazopanibplaced on hold  Liver enzymes normal 03/07/2016 5. Port-A-Cath placement 05/16/2016  Disposition:  He appears unchanged. He is tolerating the Nivolumab well. I  reviewed the restaging CT images with Mr. Carmean. Some of the bone lesions, lymph nodes, and the primary adrenal mass have enlarged slightly over the past 3 months. There are no new lesions. We decided to continue nivolumab for another 4-5 cycles prior to a repeat CT evaluation. If there is progression on the next CT he will change therapy.  25 minutes were spent with the patient today. The majority of the time was used for counseling and coordination of care.   Betsy Coder, MD  07/06/2016  11:51 AM

## 2016-07-06 NOTE — Telephone Encounter (Signed)
Gave patient AVS and calender per 5/11 los.

## 2016-07-06 NOTE — Patient Instructions (Signed)
[facility] Discharge Instructions for Patients Receiving Chemotherapy  Today you received the following chemotherapy agents Nivolumab  To help prevent nausea and vomiting after your treatment, we encourage you to take your nausea medication     If you develop nausea and vomiting that is not controlled by your nausea medication, call the clinic.   BELOW ARE SYMPTOMS THAT SHOULD BE REPORTED IMMEDIATELY:  *FEVER GREATER THAN 100.5 F  *CHILLS WITH OR WITHOUT FEVER  NAUSEA AND VOMITING THAT IS NOT CONTROLLED WITH YOUR NAUSEA MEDICATION  *UNUSUAL SHORTNESS OF BREATH  *UNUSUAL BRUISING OR BLEEDING  TENDERNESS IN MOUTH AND THROAT WITH OR WITHOUT PRESENCE OF ULCERS  *URINARY PROBLEMS  *BOWEL PROBLEMS  UNUSUAL RASH Items with * indicate a potential emergency and should be followed up as soon as possible.  Feel free to call the clinic you have any questions or concerns. The clinic phone number is [phone].  Please show the CHEMO ALERT CARD at check-in to the Emergency Department and triage nurse.   

## 2016-07-07 LAB — T3 UPTAKE
Free Thyroxine Index: 1.8 (ref 1.2–4.9)
T3 Uptake Ratio: 26 % (ref 24–39)

## 2016-07-07 LAB — T4, FREE: T4,Free(Direct): 1.04 ng/dL (ref 0.82–1.77)

## 2016-07-07 LAB — T4: Thyroxine (T4): 7.1 ug/dL (ref 4.5–12.0)

## 2016-07-09 ENCOUNTER — Encounter: Payer: Self-pay | Admitting: Oncology

## 2016-07-09 DIAGNOSIS — M6281 Muscle weakness (generalized): Secondary | ICD-10-CM | POA: Diagnosis not present

## 2016-07-09 DIAGNOSIS — R531 Weakness: Secondary | ICD-10-CM | POA: Diagnosis not present

## 2016-07-09 NOTE — Progress Notes (Signed)
Patient's sgo returned my call to discuss billing. Advised her that I am currently showing a $0 self-pay balance. She asked if he has met his OOP. I asked if she knew what hs OP max was for the year. She states she does not. I tried to find under response history and no detailed information was given. I advised her if anything changes in the billing showing a balance, I will reach back out to her to discuss. She verbalized understanding and has my name and number for any additional financial questions or concerns.

## 2016-07-14 ENCOUNTER — Other Ambulatory Visit: Payer: Self-pay | Admitting: Oncology

## 2016-07-20 ENCOUNTER — Other Ambulatory Visit (HOSPITAL_BASED_OUTPATIENT_CLINIC_OR_DEPARTMENT_OTHER): Payer: PPO

## 2016-07-20 ENCOUNTER — Ambulatory Visit (HOSPITAL_BASED_OUTPATIENT_CLINIC_OR_DEPARTMENT_OTHER): Payer: PPO

## 2016-07-20 ENCOUNTER — Ambulatory Visit: Payer: PPO

## 2016-07-20 VITALS — BP 138/67 | HR 51 | Temp 98.9°F | Resp 16

## 2016-07-20 DIAGNOSIS — Z95828 Presence of other vascular implants and grafts: Secondary | ICD-10-CM

## 2016-07-20 DIAGNOSIS — C649 Malignant neoplasm of unspecified kidney, except renal pelvis: Secondary | ICD-10-CM

## 2016-07-20 DIAGNOSIS — C641 Malignant neoplasm of right kidney, except renal pelvis: Secondary | ICD-10-CM

## 2016-07-20 DIAGNOSIS — Z5112 Encounter for antineoplastic immunotherapy: Secondary | ICD-10-CM | POA: Diagnosis not present

## 2016-07-20 DIAGNOSIS — C7951 Secondary malignant neoplasm of bone: Secondary | ICD-10-CM | POA: Diagnosis not present

## 2016-07-20 DIAGNOSIS — C642 Malignant neoplasm of left kidney, except renal pelvis: Secondary | ICD-10-CM

## 2016-07-20 LAB — COMPREHENSIVE METABOLIC PANEL
ALT: 17 U/L (ref 0–55)
AST: 13 U/L (ref 5–34)
Albumin: 4 g/dL (ref 3.5–5.0)
Alkaline Phosphatase: 80 U/L (ref 40–150)
Anion Gap: 9 mEq/L (ref 3–11)
BUN: 19 mg/dL (ref 7.0–26.0)
CO2: 28 mEq/L (ref 22–29)
Calcium: 10.4 mg/dL (ref 8.4–10.4)
Chloride: 102 mEq/L (ref 98–109)
Creatinine: 0.8 mg/dL (ref 0.7–1.3)
EGFR: 89 mL/min/{1.73_m2} — ABNORMAL LOW (ref >90)
Glucose: 108 mg/dl (ref 70–140)
Potassium: 4.1 mEq/L (ref 3.5–5.1)
Sodium: 139 mEq/L (ref 136–145)
Total Bilirubin: 0.47 mg/dL (ref 0.20–1.20)
Total Protein: 7.2 g/dL (ref 6.4–8.3)

## 2016-07-20 MED ORDER — SODIUM CHLORIDE 0.9% FLUSH
10.0000 mL | Freq: Once | INTRAVENOUS | Status: AC
  Administered 2016-07-20: 10 mL
  Filled 2016-07-20: qty 10

## 2016-07-20 MED ORDER — SODIUM CHLORIDE 0.9 % IV SOLN
Freq: Once | INTRAVENOUS | Status: AC
  Administered 2016-07-20: 13:00:00 via INTRAVENOUS

## 2016-07-20 MED ORDER — HEPARIN SOD (PORK) LOCK FLUSH 100 UNIT/ML IV SOLN
500.0000 [IU] | Freq: Once | INTRAVENOUS | Status: AC | PRN
  Administered 2016-07-20: 500 [IU]
  Filled 2016-07-20: qty 5

## 2016-07-20 MED ORDER — NIVOLUMAB CHEMO INJECTION 100 MG/10ML
240.0000 mg | Freq: Once | INTRAVENOUS | Status: AC
  Administered 2016-07-20: 240 mg via INTRAVENOUS
  Filled 2016-07-20: qty 24

## 2016-07-20 MED ORDER — SODIUM CHLORIDE 0.9% FLUSH
10.0000 mL | INTRAVENOUS | Status: DC | PRN
  Administered 2016-07-20: 10 mL
  Filled 2016-07-20: qty 10

## 2016-07-20 NOTE — Progress Notes (Signed)
No CBC needed per Dr. Ammie Dalton.  Ok to use cbc from 07/06/16

## 2016-07-20 NOTE — Patient Instructions (Signed)
[facility] Discharge Instructions for Patients Receiving Chemotherapy  Today you received the following chemotherapy agents nivolumab   To help prevent nausea and vomiting after your treatment, we encourage you to take your nausea medication as directed   If you develop nausea and vomiting that is not controlled by your nausea medication, call the clinic.   BELOW ARE SYMPTOMS THAT SHOULD BE REPORTED IMMEDIATELY:  *FEVER GREATER THAN 100.5 F  *CHILLS WITH OR WITHOUT FEVER  NAUSEA AND VOMITING THAT IS NOT CONTROLLED WITH YOUR NAUSEA MEDICATION  *UNUSUAL SHORTNESS OF BREATH  *UNUSUAL BRUISING OR BLEEDING  TENDERNESS IN MOUTH AND THROAT WITH OR WITHOUT PRESENCE OF ULCERS  *URINARY PROBLEMS  *BOWEL PROBLEMS  UNUSUAL RASH Items with * indicate a potential emergency and should be followed up as soon as possible.  Feel free to call the clinic you have any questions or concerns. The clinic phone number is [phone].  

## 2016-07-29 ENCOUNTER — Other Ambulatory Visit: Payer: Self-pay | Admitting: Oncology

## 2016-08-02 ENCOUNTER — Other Ambulatory Visit (HOSPITAL_BASED_OUTPATIENT_CLINIC_OR_DEPARTMENT_OTHER): Payer: PPO

## 2016-08-02 ENCOUNTER — Ambulatory Visit (HOSPITAL_BASED_OUTPATIENT_CLINIC_OR_DEPARTMENT_OTHER): Payer: PPO | Admitting: Nurse Practitioner

## 2016-08-02 ENCOUNTER — Ambulatory Visit: Payer: PPO

## 2016-08-02 ENCOUNTER — Telehealth: Payer: Self-pay | Admitting: Oncology

## 2016-08-02 ENCOUNTER — Ambulatory Visit (HOSPITAL_BASED_OUTPATIENT_CLINIC_OR_DEPARTMENT_OTHER): Payer: PPO

## 2016-08-02 VITALS — BP 115/54 | HR 54 | Temp 98.5°F | Resp 20 | Ht 70.0 in | Wt 249.6 lb

## 2016-08-02 DIAGNOSIS — I10 Essential (primary) hypertension: Secondary | ICD-10-CM

## 2016-08-02 DIAGNOSIS — C641 Malignant neoplasm of right kidney, except renal pelvis: Secondary | ICD-10-CM | POA: Diagnosis not present

## 2016-08-02 DIAGNOSIS — Z5112 Encounter for antineoplastic immunotherapy: Secondary | ICD-10-CM | POA: Diagnosis not present

## 2016-08-02 DIAGNOSIS — C7951 Secondary malignant neoplasm of bone: Secondary | ICD-10-CM | POA: Diagnosis not present

## 2016-08-02 DIAGNOSIS — C642 Malignant neoplasm of left kidney, except renal pelvis: Secondary | ICD-10-CM

## 2016-08-02 DIAGNOSIS — C7949 Secondary malignant neoplasm of other parts of nervous system: Secondary | ICD-10-CM

## 2016-08-02 DIAGNOSIS — C649 Malignant neoplasm of unspecified kidney, except renal pelvis: Secondary | ICD-10-CM | POA: Diagnosis not present

## 2016-08-02 LAB — COMPREHENSIVE METABOLIC PANEL
ALT: 19 U/L (ref 0–55)
AST: 14 U/L (ref 5–34)
Albumin: 4 g/dL (ref 3.5–5.0)
Alkaline Phosphatase: 78 U/L (ref 40–150)
Anion Gap: 9 mEq/L (ref 3–11)
BUN: 15.8 mg/dL (ref 7.0–26.0)
CO2: 25 mEq/L (ref 22–29)
Calcium: 10.4 mg/dL (ref 8.4–10.4)
Chloride: 104 mEq/L (ref 98–109)
Creatinine: 0.8 mg/dL (ref 0.7–1.3)
EGFR: 89 mL/min/{1.73_m2} — ABNORMAL LOW (ref >90)
Glucose: 127 mg/dl (ref 70–140)
Potassium: 3.9 mEq/L (ref 3.5–5.1)
Sodium: 138 mEq/L (ref 136–145)
Total Bilirubin: 0.42 mg/dL (ref 0.20–1.20)
Total Protein: 7.3 g/dL (ref 6.4–8.3)

## 2016-08-02 MED ORDER — SODIUM CHLORIDE 0.9 % IJ SOLN
10.0000 mL | Freq: Once | INTRAMUSCULAR | Status: AC
  Administered 2016-08-02: 10 mL via INTRAVENOUS
  Filled 2016-08-02: qty 10

## 2016-08-02 MED ORDER — SODIUM CHLORIDE 0.9 % IV SOLN
Freq: Once | INTRAVENOUS | Status: AC
  Administered 2016-08-02: 15:00:00 via INTRAVENOUS

## 2016-08-02 MED ORDER — HEPARIN SOD (PORK) LOCK FLUSH 100 UNIT/ML IV SOLN
500.0000 [IU] | Freq: Once | INTRAVENOUS | Status: AC | PRN
  Administered 2016-08-02: 500 [IU]
  Filled 2016-08-02: qty 5

## 2016-08-02 MED ORDER — SODIUM CHLORIDE 0.9% FLUSH
10.0000 mL | INTRAVENOUS | Status: DC | PRN
  Administered 2016-08-02: 10 mL
  Filled 2016-08-02: qty 10

## 2016-08-02 MED ORDER — SODIUM CHLORIDE 0.9 % IV SOLN
240.0000 mg | Freq: Once | INTRAVENOUS | Status: AC
  Administered 2016-08-02: 240 mg via INTRAVENOUS
  Filled 2016-08-02: qty 24

## 2016-08-02 NOTE — Patient Instructions (Signed)
[facility] Discharge Instructions for Patients Receiving Chemotherapy  Today you received the following chemotherapy agents Nivolumab  To help prevent nausea and vomiting after your treatment, we encourage you to take your nausea medication     If you develop nausea and vomiting that is not controlled by your nausea medication, call the clinic.   BELOW ARE SYMPTOMS THAT SHOULD BE REPORTED IMMEDIATELY:  *FEVER GREATER THAN 100.5 F  *CHILLS WITH OR WITHOUT FEVER  NAUSEA AND VOMITING THAT IS NOT CONTROLLED WITH YOUR NAUSEA MEDICATION  *UNUSUAL SHORTNESS OF BREATH  *UNUSUAL BRUISING OR BLEEDING  TENDERNESS IN MOUTH AND THROAT WITH OR WITHOUT PRESENCE OF ULCERS  *URINARY PROBLEMS  *BOWEL PROBLEMS  UNUSUAL RASH Items with * indicate a potential emergency and should be followed up as soon as possible.  Feel free to call the clinic you have any questions or concerns. The clinic phone number is [phone].  Please show the CHEMO ALERT CARD at check-in to the Emergency Department and triage nurse.   

## 2016-08-02 NOTE — Progress Notes (Signed)
[place] OFFICE PROGRESS NOTE   Diagnosis:  Renal cell carcinoma  INTERVAL HISTORY:   Phillip [name] returns as scheduled. He completed another cycle of nivolumab 07/20/2016. He is had a few episodes of "queasiness". No diarrhea. No skin rash. He reports a few episodes of "tingling" on his skin at various locations. This does not occur on a daily basis. He reports increased pain at the right groin/upper leg. The pain improved after an "injection". He continues oxymorphone with oxycodone as needed. He typically takes oxycodone every 7-8 hours. At times he is able to go 11 hours between doses.  Objective:  Vital signs in last 24 hours:  Blood pressure (!) 115/54, pulse (!) 54, temperature 98.5 F (36.9 C), temperature source Oral, resp. rate 20, height 5\' 10"  (1.778 m), weight 249 lb 9.6 oz (113.2 kg), SpO2 100 %.    HEENT: No thrush or ulcers. Resp: Lungs clear bilaterally. Cardio: Regular rate and rhythm. GI: No hepatomegaly. Vascular: No leg edema. Neuro: Alert and oriented.  Skin: No rash. Port-A-Cath without erythema.    Lab Results:  Lab Results  Component Value Date   WBC 7.6 07/06/2016   HGB 14.1 07/06/2016   HCT 43.3 07/06/2016   MCV 88.9 07/06/2016   PLT 184 07/06/2016   NEUTROABS 4.4 07/06/2016    Imaging:  No results found.  Medications: I have reviewed the patient's current medications.  Assessment/Plan: 1. Metastatic renal cell carcinoma   L4 mass with extraosseous extension, L4 nerve compression  Biopsy of the L4 mass 11/18/2015 confirmed metastatic renal cell carcinoma, clear cell type  CTs of the chest, abdomen, and pelvis 11/18/2015-right lower lobe nodule, expansile lytic lesion at the right 11th rib/costal vertebral junction, left renal mass, expansile lesion involving the L4 vertebra, lytic lesion at the left acetabulum, and a low-attenuation liver lesion  Initiation of SRS to L4 12/02/2015, Completed 12/12/2015  Initiation  of Pazopanib11/04/2015  Pazopanibplaced on hold 02/06/2016 secondary to elevated liver enzymes  Pazopanibresumed 03/07/2016 at a dose of 400 mg daily  Pazopanibdiscontinued 03/19/2016 secondary to elevated liver enzymes  Restaging CTs 04/02/2016-stable left renal mass, decreased soft tissue component associated with the L4 metastasis, increased soft tissue component associated with the right 11th rib metastasis with increased T11 bony destruction, increased sclerosis at the left acetabulum lesion  Cycle 1 nivolumab 04/12/2016  Cycle 2 nivolumab 04/26/2016  Cycle 3 nivolumab03/16/2018  Cycle 4 nivolumab 05/24/2016  Cycle 5 nivolumab 06/08/2016  MRI lumbar spine 06/21/2016-unchanged tumor at L3, increased size of retroperitoneal lymph nodes compared to a CT from 04/02/2016  Cycle 6 nivolumab 06/22/2016  CTs chest, abdomen, and pelvis 07/04/2016-enlargement of the left renal mass, right adrenal nodule, left hilar and peritoneal lymph nodes, enlargement of left acetabular lesion. Stable lung nodules.  Cycle 7 nivolumab 07/06/2016   Cycle 8 nivolumab 07/20/2016  Cycle 9 nivolumab 08/02/2016   2. Pain secondary to #1-Improved  3. Hypertension 4. Elevated transaminases 02/06/2016-Pazopanibplaced on hold  Liver enzymes normal 03/07/2016 5. Port-A-Cath placement 05/16/2016    Disposition: Phillip [name] appears stable. He has completed 8 cycles of nivolumab. Plan to proceed with cycle 9 today as scheduled.  He is having increased pain at the right groin/upper leg. He has had similar pain in the past which markedly improved following an injection. He will contact Dr. Lovenia Shuck to see if he is a candidate for another injection. He will continue oxymorphone and oxycodone as he is currently taking.  At his request he will return for a  follow-up visit and cycle 10 nivolumab on 08/20/2016. He will contact the office in the interim with any problems.  Plan reviewed with Dr.  [name].    [name] ANP/GNP-BC   08/02/2016  2:58 PM

## 2016-08-02 NOTE — Patient Instructions (Signed)
[facility] Discharge Instructions for Patients Receiving Chemotherapy  Today you received the following chemotherapy agents nivolumab (Opdivo)  To help prevent nausea and vomiting after your treatment, we encourage you to take your nausea medication as directed  If you develop nausea and vomiting that is not controlled by your nausea medication, call the clinic.   BELOW ARE SYMPTOMS THAT SHOULD BE REPORTED IMMEDIATELY:  *FEVER GREATER THAN 100.5 F  *CHILLS WITH OR WITHOUT FEVER  NAUSEA AND VOMITING THAT IS NOT CONTROLLED WITH YOUR NAUSEA MEDICATION  *UNUSUAL SHORTNESS OF BREATH  *UNUSUAL BRUISING OR BLEEDING  TENDERNESS IN MOUTH AND THROAT WITH OR WITHOUT PRESENCE OF ULCERS  *URINARY PROBLEMS  *BOWEL PROBLEMS  UNUSUAL RASH Items with * indicate a potential emergency and should be followed up as soon as possible.  Feel free to call the clinic you have any questions or concerns. The clinic phone number is [phone].  

## 2016-08-02 NOTE — Telephone Encounter (Signed)
Gave patient AVS and calender per 6/7 los - scheduled appt per 6/7

## 2016-08-17 ENCOUNTER — Other Ambulatory Visit: Payer: Self-pay | Admitting: *Deleted

## 2016-08-17 DIAGNOSIS — C649 Malignant neoplasm of unspecified kidney, except renal pelvis: Secondary | ICD-10-CM

## 2016-08-17 DIAGNOSIS — C7949 Secondary malignant neoplasm of other parts of nervous system: Secondary | ICD-10-CM

## 2016-08-19 ENCOUNTER — Other Ambulatory Visit: Payer: Self-pay | Admitting: Oncology

## 2016-08-20 ENCOUNTER — Other Ambulatory Visit (HOSPITAL_BASED_OUTPATIENT_CLINIC_OR_DEPARTMENT_OTHER): Payer: PPO

## 2016-08-20 ENCOUNTER — Ambulatory Visit (HOSPITAL_BASED_OUTPATIENT_CLINIC_OR_DEPARTMENT_OTHER): Payer: PPO | Admitting: Nurse Practitioner

## 2016-08-20 ENCOUNTER — Telehealth: Payer: Self-pay | Admitting: Oncology

## 2016-08-20 ENCOUNTER — Ambulatory Visit (HOSPITAL_BASED_OUTPATIENT_CLINIC_OR_DEPARTMENT_OTHER): Payer: PPO

## 2016-08-20 ENCOUNTER — Ambulatory Visit: Payer: PPO

## 2016-08-20 VITALS — BP 109/44 | HR 50 | Temp 98.4°F | Resp 17 | Ht 70.0 in | Wt 251.1 lb

## 2016-08-20 DIAGNOSIS — C649 Malignant neoplasm of unspecified kidney, except renal pelvis: Secondary | ICD-10-CM

## 2016-08-20 DIAGNOSIS — C641 Malignant neoplasm of right kidney, except renal pelvis: Secondary | ICD-10-CM

## 2016-08-20 DIAGNOSIS — C7949 Secondary malignant neoplasm of other parts of nervous system: Secondary | ICD-10-CM

## 2016-08-20 DIAGNOSIS — C642 Malignant neoplasm of left kidney, except renal pelvis: Secondary | ICD-10-CM

## 2016-08-20 DIAGNOSIS — C7951 Secondary malignant neoplasm of bone: Secondary | ICD-10-CM | POA: Diagnosis not present

## 2016-08-20 DIAGNOSIS — Z5112 Encounter for antineoplastic immunotherapy: Secondary | ICD-10-CM

## 2016-08-20 LAB — CBC WITH DIFFERENTIAL/PLATELET
BASO%: 1 % (ref 0.0–2.0)
Basophils Absolute: 0.1 10*3/uL (ref 0.0–0.1)
EOS%: 13.4 % — ABNORMAL HIGH (ref 0.0–7.0)
Eosinophils Absolute: 1 10*3/uL — ABNORMAL HIGH (ref 0.0–0.5)
HCT: 42.4 % (ref 38.4–49.9)
HGB: 13.9 g/dL (ref 13.0–17.1)
LYMPH%: 27.2 % (ref 14.0–49.0)
MCH: 28.5 pg (ref 27.2–33.4)
MCHC: 32.8 g/dL (ref 32.0–36.0)
MCV: 86.9 fL (ref 79.3–98.0)
MONO#: 1 10*3/uL — ABNORMAL HIGH (ref 0.1–0.9)
MONO%: 12.2 % (ref 0.0–14.0)
NEUT#: 3.6 10*3/uL (ref 1.5–6.5)
NEUT%: 46.2 % (ref 39.0–75.0)
Platelets: 195 10*3/uL (ref 140–400)
RBC: 4.88 10*6/uL (ref 4.20–5.82)
RDW: 13.4 % (ref 11.0–14.6)
WBC: 7.8 10*3/uL (ref 4.0–10.3)
lymph#: 2.1 10*3/uL (ref 0.9–3.3)

## 2016-08-20 LAB — COMPREHENSIVE METABOLIC PANEL
ALT: 17 U/L (ref 0–55)
AST: 16 U/L (ref 5–34)
Albumin: 3.9 g/dL (ref 3.5–5.0)
Alkaline Phosphatase: 73 U/L (ref 40–150)
Anion Gap: 10 mEq/L (ref 3–11)
BUN: 14.8 mg/dL (ref 7.0–26.0)
CO2: 27 mEq/L (ref 22–29)
Calcium: 10.5 mg/dL — ABNORMAL HIGH (ref 8.4–10.4)
Chloride: 102 mEq/L (ref 98–109)
Creatinine: 0.8 mg/dL (ref 0.7–1.3)
EGFR: 90 mL/min/{1.73_m2} — ABNORMAL LOW (ref >90)
Glucose: 95 mg/dl (ref 70–140)
Potassium: 4.1 mEq/L (ref 3.5–5.1)
Sodium: 139 mEq/L (ref 136–145)
Total Bilirubin: 0.54 mg/dL (ref 0.20–1.20)
Total Protein: 7 g/dL (ref 6.4–8.3)

## 2016-08-20 LAB — TSH: TSH: 0.357 m(IU)/L (ref 0.320–4.118)

## 2016-08-20 MED ORDER — SODIUM CHLORIDE 0.9% FLUSH
10.0000 mL | INTRAVENOUS | Status: DC | PRN
  Administered 2016-08-20: 10 mL
  Filled 2016-08-20: qty 10

## 2016-08-20 MED ORDER — SODIUM CHLORIDE 0.9 % IV SOLN
Freq: Once | INTRAVENOUS | Status: AC
  Administered 2016-08-20: 16:00:00 via INTRAVENOUS

## 2016-08-20 MED ORDER — SODIUM CHLORIDE 0.9 % IJ SOLN
10.0000 mL | Freq: Once | INTRAMUSCULAR | Status: AC
  Administered 2016-08-20: 10 mL
  Filled 2016-08-20: qty 10

## 2016-08-20 MED ORDER — HEPARIN SOD (PORK) LOCK FLUSH 100 UNIT/ML IV SOLN
500.0000 [IU] | Freq: Once | INTRAVENOUS | Status: AC | PRN
  Administered 2016-08-20: 500 [IU]
  Filled 2016-08-20: qty 5

## 2016-08-20 MED ORDER — SODIUM CHLORIDE 0.9 % IV SOLN
240.0000 mg | Freq: Once | INTRAVENOUS | Status: AC
  Administered 2016-08-20: 240 mg via INTRAVENOUS
  Filled 2016-08-20: qty 24

## 2016-08-20 NOTE — Progress Notes (Signed)
  Phillip [name]   Diagnosis:  Renal cell carcinoma  INTERVAL HISTORY:   Phillip [name] returns as scheduled. He completed cycle 9 nivolumab 08/02/2016. He reports progressive fatigue. He has intermittent nausea. Appetite is diminished. No diarrhea. No rash. He has increased pain at the right hip and groin.  Objective:  Vital signs in last 24 hours:  Blood pressure (!) 109/44, pulse (!) 50, temperature 98.4 F (36.9 C), temperature source Oral, resp. rate 17, height 5\' 10"  (1.778 m), weight 251 lb 1.6 oz (113.9 kg), SpO2 96 %.    HEENT: No thrush or ulcers. Resp: Lungs clear bilaterally. Cardio: Regular rate and rhythm. GI: No hepatomegaly. Vascular: No leg edema. Skin: No rash. Port-A-Cath without erythema.    Lab Results:  Lab Results  Component Value Date   WBC 7.8 08/20/2016   HGB 13.9 08/20/2016   HCT 42.4 08/20/2016   MCV 86.9 08/20/2016   PLT 195 08/20/2016   NEUTROABS 3.6 08/20/2016    Imaging:  No results found.  Medications: I have reviewed the patient's current medications.  Assessment/Plan: 1. Metastatic renal cell carcinoma   L4 mass with extraosseous extension, L4 nerve compression  Biopsy of the L4 mass 11/18/2015 confirmed metastatic renal cell carcinoma, clear cell type  CTs of the chest, abdomen, and pelvis 11/18/2015-right lower lobe nodule, expansile lytic lesion at the right 11th rib/costal vertebral junction, left renal mass, expansile lesion involving the L4 vertebra, lytic lesion at the left acetabulum, and a low-attenuation liver lesion  Initiation of SRS to L4 12/02/2015, Completed 12/12/2015  Initiation of Pazopanib11/04/2015  Pazopanibplaced on hold 02/06/2016 secondary to elevated liver enzymes  Pazopanibresumed 03/07/2016 at a dose of 400 mg daily  Pazopanibdiscontinued 03/19/2016 secondary to elevated liver enzymes  Restaging CTs 04/02/2016-stable left renal mass, decreased soft tissue  component associated with the L4 metastasis, increased soft tissue component associated with the right 11th rib metastasis with increased T11 bony destruction, increased sclerosis at the left acetabulum lesion  Cycle 1 nivolumab 04/12/2016  Cycle 2 nivolumab 04/26/2016  Cycle 3 nivolumab03/16/2018  Cycle 4 nivolumab 05/24/2016  Cycle 5 nivolumab 06/08/2016  MRI lumbar spine 06/21/2016-unchanged tumor at L3, increased size of retroperitoneal lymph nodes compared to a CT from 04/02/2016  Cycle 6 nivolumab 06/22/2016  CTs chest, abdomen, and pelvis 07/04/2016-enlargement of the left renal mass, right adrenal nodule, left hilar and peritoneal lymph nodes, enlargement of left acetabular lesion. Stable lung nodules.  Cycle 7 nivolumab05/12/2016   Cycle 8 nivolumab 07/20/2016  Cycle 9 nivolumab 08/02/2016  Cycle 10 nivolumab 08/20/2016   2. Pain secondary to #1 3. Hypertension 4. Elevated transaminases 02/06/2016-Pazopanibplaced on hold  Liver enzymes normal 03/07/2016 5. Port-A-Cath placement 05/16/2016   Disposition: Phillip [name] appears stable. He has completed 9 cycles of nivolumab. Plan to proceed with cycle 10 today as scheduled. We decided to proceed with restaging CT scans after this cycle.  He has continued pain at the right hip and groin. He has an "injection" scheduled for later this week.  He will have CT scans on 08/30/2016. We will see him in follow-up on 09/05/2016. He will contact the office in the interim with any problems.  Plan reviewed with Dr. [name].    [name] ANP/GNP-BC   08/20/2016  2:58 PM

## 2016-08-20 NOTE — Telephone Encounter (Signed)
Appointments scheduled per 08/20/16 los. °Patient was given a copy of the AVS report and appointment schedule, per 08/20/16 los. °

## 2016-08-20 NOTE — Telephone Encounter (Signed)
Patient was given 2 bottles of Oral Contrast with a copy of the instructions.

## 2016-08-21 LAB — T3 UPTAKE
Free Thyroxine Index: 1.7 (ref 1.2–4.9)
T3 Uptake Ratio: 21 % — ABNORMAL LOW (ref 24–39)

## 2016-08-21 LAB — T4: Thyroxine (T4): 8.2 ug/dL (ref 4.5–12.0)

## 2016-08-21 LAB — T4, FREE: T4,Free(Direct): 1.09 ng/dL (ref 0.82–1.77)

## 2016-08-23 DIAGNOSIS — C7951 Secondary malignant neoplasm of bone: Secondary | ICD-10-CM | POA: Diagnosis not present

## 2016-08-23 DIAGNOSIS — M5416 Radiculopathy, lumbar region: Secondary | ICD-10-CM | POA: Diagnosis not present

## 2016-09-03 ENCOUNTER — Ambulatory Visit (HOSPITAL_COMMUNITY)
Admission: RE | Admit: 2016-09-03 | Discharge: 2016-09-03 | Disposition: A | Discharge status: Home / Self Care | Payer: PPO | Source: Ambulatory Visit | Attending: Nurse Practitioner | Admitting: Nurse Practitioner

## 2016-09-03 ENCOUNTER — Other Ambulatory Visit: Payer: Self-pay

## 2016-09-03 DIAGNOSIS — C7951 Secondary malignant neoplasm of bone: Secondary | ICD-10-CM | POA: Diagnosis not present

## 2016-09-03 DIAGNOSIS — C7949 Secondary malignant neoplasm of other parts of nervous system: Secondary | ICD-10-CM

## 2016-09-03 DIAGNOSIS — C649 Malignant neoplasm of unspecified kidney, except renal pelvis: Secondary | ICD-10-CM | POA: Insufficient documentation

## 2016-09-03 DIAGNOSIS — I700 Atherosclerosis of aorta: Secondary | ICD-10-CM | POA: Insufficient documentation

## 2016-09-03 DIAGNOSIS — R590 Localized enlarged lymph nodes: Secondary | ICD-10-CM | POA: Insufficient documentation

## 2016-09-03 DIAGNOSIS — R918 Other nonspecific abnormal finding of lung field: Secondary | ICD-10-CM | POA: Insufficient documentation

## 2016-09-03 DIAGNOSIS — C762 Malignant neoplasm of abdomen: Secondary | ICD-10-CM | POA: Diagnosis not present

## 2016-09-03 MED ORDER — IOPAMIDOL (ISOVUE-300) INJECTION 61%
100.0000 mL | Freq: Once | INTRAVENOUS | Status: AC | PRN
  Administered 2016-09-03: 100 mL via INTRAVENOUS

## 2016-09-03 MED ORDER — HEPARIN SOD (PORK) LOCK FLUSH 100 UNIT/ML IV SOLN
INTRAVENOUS | Status: AC
  Filled 2016-09-03: qty 5

## 2016-09-03 MED ORDER — IOPAMIDOL (ISOVUE-300) INJECTION 61%
INTRAVENOUS | Status: AC
  Filled 2016-09-03: qty 100

## 2016-09-03 MED ORDER — HEPARIN SOD (PORK) LOCK FLUSH 100 UNIT/ML IV SOLN
500.0000 [IU] | Freq: Once | INTRAVENOUS | Status: AC
  Administered 2016-09-03: 500 [IU] via INTRAVENOUS

## 2016-09-03 NOTE — Progress Notes (Signed)
Order for CT of abdomen/pelvis with and without contrast done.

## 2016-09-05 ENCOUNTER — Ambulatory Visit: Payer: PPO

## 2016-09-05 ENCOUNTER — Telehealth: Payer: Self-pay | Admitting: Oncology

## 2016-09-05 ENCOUNTER — Other Ambulatory Visit: Payer: Self-pay

## 2016-09-05 ENCOUNTER — Ambulatory Visit (HOSPITAL_BASED_OUTPATIENT_CLINIC_OR_DEPARTMENT_OTHER): Payer: PPO

## 2016-09-05 ENCOUNTER — Ambulatory Visit (HOSPITAL_BASED_OUTPATIENT_CLINIC_OR_DEPARTMENT_OTHER): Payer: PPO | Admitting: Oncology

## 2016-09-05 ENCOUNTER — Other Ambulatory Visit (HOSPITAL_BASED_OUTPATIENT_CLINIC_OR_DEPARTMENT_OTHER): Payer: PPO

## 2016-09-05 VITALS — BP 135/68 | HR 75 | Temp 98.7°F | Resp 18 | Ht 70.0 in | Wt 247.7 lb

## 2016-09-05 DIAGNOSIS — C649 Malignant neoplasm of unspecified kidney, except renal pelvis: Secondary | ICD-10-CM

## 2016-09-05 DIAGNOSIS — C641 Malignant neoplasm of right kidney, except renal pelvis: Secondary | ICD-10-CM

## 2016-09-05 DIAGNOSIS — Z5112 Encounter for antineoplastic immunotherapy: Secondary | ICD-10-CM | POA: Diagnosis not present

## 2016-09-05 DIAGNOSIS — C7951 Secondary malignant neoplasm of bone: Secondary | ICD-10-CM | POA: Diagnosis not present

## 2016-09-05 DIAGNOSIS — R531 Weakness: Secondary | ICD-10-CM

## 2016-09-05 DIAGNOSIS — C7949 Secondary malignant neoplasm of other parts of nervous system: Secondary | ICD-10-CM

## 2016-09-05 DIAGNOSIS — R630 Anorexia: Secondary | ICD-10-CM | POA: Diagnosis not present

## 2016-09-05 DIAGNOSIS — C642 Malignant neoplasm of left kidney, except renal pelvis: Secondary | ICD-10-CM

## 2016-09-05 DIAGNOSIS — Z95828 Presence of other vascular implants and grafts: Secondary | ICD-10-CM

## 2016-09-05 DIAGNOSIS — Z79899 Other long term (current) drug therapy: Secondary | ICD-10-CM

## 2016-09-05 LAB — COMPREHENSIVE METABOLIC PANEL
ALT: 16 U/L (ref 0–55)
AST: 13 U/L (ref 5–34)
Albumin: 3.9 g/dL (ref 3.5–5.0)
Alkaline Phosphatase: 65 U/L (ref 40–150)
Anion Gap: 10 mEq/L (ref 3–11)
BUN: 14 mg/dL (ref 7.0–26.0)
CO2: 27 mEq/L (ref 22–29)
Calcium: 10.5 mg/dL — ABNORMAL HIGH (ref 8.4–10.4)
Chloride: 101 mEq/L (ref 98–109)
Creatinine: 0.9 mg/dL (ref 0.7–1.3)
EGFR: 86 mL/min/{1.73_m2} — ABNORMAL LOW (ref >90)
Glucose: 159 mg/dl — ABNORMAL HIGH (ref 70–140)
Potassium: 3.9 mEq/L (ref 3.5–5.1)
Sodium: 138 mEq/L (ref 136–145)
Total Bilirubin: 0.43 mg/dL (ref 0.20–1.20)
Total Protein: 7.1 g/dL (ref 6.4–8.3)

## 2016-09-05 LAB — CBC WITH DIFFERENTIAL/PLATELET
BASO%: 0.4 % (ref 0.0–2.0)
Basophils Absolute: 0 10*3/uL (ref 0.0–0.1)
EOS%: 11.8 % — ABNORMAL HIGH (ref 0.0–7.0)
Eosinophils Absolute: 1.1 10*3/uL — ABNORMAL HIGH (ref 0.0–0.5)
HCT: 42.8 % (ref 38.4–49.9)
HGB: 14.1 g/dL (ref 13.0–17.1)
LYMPH%: 22.8 % (ref 14.0–49.0)
MCH: 28.7 pg (ref 27.2–33.4)
MCHC: 32.9 g/dL (ref 32.0–36.0)
MCV: 87.2 fL (ref 79.3–98.0)
MONO#: 0.9 10*3/uL (ref 0.1–0.9)
MONO%: 10.5 % (ref 0.0–14.0)
NEUT#: 4.9 10*3/uL (ref 1.5–6.5)
NEUT%: 54.5 % (ref 39.0–75.0)
Platelets: 191 10*3/uL (ref 140–400)
RBC: 4.91 10*6/uL (ref 4.20–5.82)
RDW: 13.6 % (ref 11.0–14.6)
WBC: 8.9 10*3/uL (ref 4.0–10.3)
lymph#: 2 10*3/uL (ref 0.9–3.3)

## 2016-09-05 LAB — TSH: TSH: 0.65 m(IU)/L (ref 0.320–4.118)

## 2016-09-05 MED ORDER — OXYCODONE HCL 5 MG PO CAPS: 5.0000 mg | ORAL_CAPSULE | ORAL | 0 refills | Status: DC | PRN

## 2016-09-05 MED ORDER — SODIUM CHLORIDE 0.9 % IV SOLN
240.0000 mg | Freq: Once | INTRAVENOUS | Status: AC
  Administered 2016-09-05: 240 mg via INTRAVENOUS
  Filled 2016-09-05: qty 24

## 2016-09-05 MED ORDER — SODIUM CHLORIDE 0.9% FLUSH
10.0000 mL | Freq: Once | INTRAVENOUS | Status: AC
  Administered 2016-09-05: 10 mL
  Filled 2016-09-05: qty 10

## 2016-09-05 MED ORDER — SODIUM CHLORIDE 0.9 % IV SOLN
Freq: Once | INTRAVENOUS | Status: AC
  Administered 2016-09-05: 14:00:00 via INTRAVENOUS

## 2016-09-05 MED ORDER — SODIUM CHLORIDE 0.9% FLUSH
10.0000 mL | INTRAVENOUS | Status: DC | PRN
  Administered 2016-09-05: 10 mL
  Filled 2016-09-05: qty 10

## 2016-09-05 MED ORDER — HEPARIN SOD (PORK) LOCK FLUSH 100 UNIT/ML IV SOLN
500.0000 [IU] | Freq: Once | INTRAVENOUS | Status: AC | PRN
  Administered 2016-09-05: 500 [IU]
  Filled 2016-09-05: qty 5

## 2016-09-05 NOTE — Patient Instructions (Signed)
[facility] Discharge Instructions for Patients Receiving Chemotherapy  Today you received the following chemotherapy agents:  Nivolumab.  To help prevent nausea and vomiting after your treatment, we encourage you to take your nausea medication as directed.   If you develop nausea and vomiting that is not controlled by your nausea medication, call the clinic.   BELOW ARE SYMPTOMS THAT SHOULD BE REPORTED IMMEDIATELY:  *FEVER GREATER THAN 100.5 F  *CHILLS WITH OR WITHOUT FEVER  NAUSEA AND VOMITING THAT IS NOT CONTROLLED WITH YOUR NAUSEA MEDICATION  *UNUSUAL SHORTNESS OF BREATH  *UNUSUAL BRUISING OR BLEEDING  TENDERNESS IN MOUTH AND THROAT WITH OR WITHOUT PRESENCE OF ULCERS  *URINARY PROBLEMS  *BOWEL PROBLEMS  UNUSUAL RASH Items with * indicate a potential emergency and should be followed up as soon as possible.  Feel free to call the clinic you have any questions or concerns. The clinic phone number is [phone].  Please show the CHEMO ALERT CARD at check-in to the Emergency Department and triage nurse.   

## 2016-09-05 NOTE — Progress Notes (Signed)
His overall status appears unchanged. The restaging CT reveals stable disease. The plan is to continue nivolumab.  He will increase the oxycodone to 5-10 mg every 4 hours as needed for pain.  The etiology of the malaise and anorexia is unclear. The thyroid function studies were unremarkable last month. We will repeat thyroid studies today and check cortisol and testosterone levels.  I doubt the mildly elevated calcium is contributing to his symptoms.  He will return for treatment and repeat laboratory studies in 2 weeks.  25 minutes were spent with the patient today. The majority of the time was used for counseling and coordination of care.  09/05/2016  12:57 PM

## 2016-09-05 NOTE — Telephone Encounter (Signed)
Scheduled per 7/11 los. Gave pt avs.

## 2016-09-05 NOTE — Patient Instructions (Signed)

## 2016-09-06 ENCOUNTER — Telehealth: Payer: Self-pay | Admitting: *Deleted

## 2016-09-06 LAB — CORTISOL: Cortisol: 2.4 ug/dL

## 2016-09-06 LAB — T4: Thyroxine (T4): 7.6 ug/dL (ref 4.5–12.0)

## 2016-09-06 LAB — TESTOSTERONE: Testosterone, Serum: 265 ng/dL (ref 264–916)

## 2016-09-06 NOTE — Telephone Encounter (Signed)
Call from pt's pharmacy to clarify Oxycodone script. Is it OK to dispense tablet as opposed to capsule that is ordered? YES.[facility] new instructions: Take 1-2 Q 4 hours.

## 2016-09-07 ENCOUNTER — Telehealth: Payer: Self-pay | Admitting: *Deleted

## 2016-09-07 DIAGNOSIS — C649 Malignant neoplasm of unspecified kidney, except renal pelvis: Secondary | ICD-10-CM

## 2016-09-07 MED ORDER — HYDROCORTISONE 10 MG PO TABS: ORAL_TABLET | ORAL | 0 refills | Status: DC

## 2016-09-07 NOTE — Telephone Encounter (Signed)
-----   Message from [name], MD sent at 09/06/2016 10:48 PM EDT ----- Please call patient, cortisone and testosterone are low, start hydrocortisone and testosterone replacement.  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 Hydrocortisone 20mg  Am, 10mg  PM Testosterone replacement per pharmacy

## 2016-09-07 NOTE — Telephone Encounter (Signed)
-----   Message from Tora Kindred, [facility] sent at 09/07/2016  1:45 PM EDT ----- Regarding: Testosterone dosing AndroGel 1%: 50 mg applied once daily in the morning to the shoulder and upper arms, or abdomen  Monitoring-- AndroGel 1%: Morning (pre-dose) serum testosterone levels ~14 days after start of therapy or dose adjustments Dr. [name] can order levels whenever he thinks is appropriate.  Phillip [name]

## 2016-09-07 NOTE — Telephone Encounter (Signed)
Informed pt and S/O of lab result, per MD note below. Teach back complete on Cortef instructions and dosing. Informed them testosterone will be called to pharmacy on 7/16 after further review by providers.

## 2016-09-10 ENCOUNTER — Other Ambulatory Visit: Payer: Self-pay | Admitting: Nurse Practitioner

## 2016-09-10 ENCOUNTER — Other Ambulatory Visit: Payer: Self-pay | Admitting: *Deleted

## 2016-09-10 DIAGNOSIS — C649 Malignant neoplasm of unspecified kidney, except renal pelvis: Secondary | ICD-10-CM

## 2016-09-10 MED ORDER — TESTOSTERONE 50 MG/5GM (1%) TD GEL: TRANSDERMAL | 1 refills | Status: DC

## 2016-09-10 NOTE — Telephone Encounter (Signed)
Androgel script called to pharmacy. Left message on voicemail informing pt.

## 2016-09-11 ENCOUNTER — Telehealth: Payer: Self-pay | Admitting: Medical Oncology

## 2016-09-11 NOTE — Telephone Encounter (Signed)
Phillip [name] called about missed call. I called back and left message that I do not see a call out to pt. Left message for her to call back.

## 2016-09-12 ENCOUNTER — Encounter: Payer: Self-pay | Admitting: Nurse Practitioner

## 2016-09-12 NOTE — Progress Notes (Signed)
Received PA request for Testosterone gel and Zofran.   Called [facility](Allen) on 09/11/16 to initiate PA. He states Zofran did not need PA and a paid claim had already gone through. Testosterone gel does require and he advised I could complete on Cover My Meds or he could fax the form. I advised I would complete on Cover My Meds.  Attempted to complete on Cover My Meds and received an error.  Called back to [facility](Elizabeth) today to request form be faxed.

## 2016-09-12 NOTE — Progress Notes (Signed)
Received PA request form via fax. Gave to [place] NP to complete and return to me.

## 2016-09-13 ENCOUNTER — Encounter: Payer: Self-pay | Admitting: Nurse Practitioner

## 2016-09-13 NOTE — Progress Notes (Signed)
Received voicemail from so inquiring about prior auth.  Went to [place] NP to advise they were waiting on PA to get filled. She completed and signed form.  I faxed to [employer]. Fax received ok per confirmation sheet.

## 2016-09-13 NOTE — Progress Notes (Signed)
Received PA determination for Testosterone gel from [employer].  PA approved 09/13/16-02/25/17.   Called [facility](Kelly) to advise of approval. She states they will notify patient when ready.

## 2016-09-19 ENCOUNTER — Ambulatory Visit: Payer: PPO

## 2016-09-19 ENCOUNTER — Other Ambulatory Visit: Payer: Self-pay | Admitting: *Deleted

## 2016-09-19 ENCOUNTER — Ambulatory Visit (HOSPITAL_BASED_OUTPATIENT_CLINIC_OR_DEPARTMENT_OTHER): Payer: PPO

## 2016-09-19 ENCOUNTER — Ambulatory Visit (HOSPITAL_BASED_OUTPATIENT_CLINIC_OR_DEPARTMENT_OTHER): Payer: PPO | Admitting: Nurse Practitioner

## 2016-09-19 ENCOUNTER — Other Ambulatory Visit (HOSPITAL_BASED_OUTPATIENT_CLINIC_OR_DEPARTMENT_OTHER): Payer: PPO

## 2016-09-19 VITALS — BP 125/49 | HR 62 | Temp 98.9°F | Resp 18 | Ht 70.0 in | Wt 246.6 lb

## 2016-09-19 DIAGNOSIS — Z95828 Presence of other vascular implants and grafts: Secondary | ICD-10-CM

## 2016-09-19 DIAGNOSIS — C641 Malignant neoplasm of right kidney, except renal pelvis: Secondary | ICD-10-CM

## 2016-09-19 DIAGNOSIS — R5383 Other fatigue: Secondary | ICD-10-CM | POA: Diagnosis not present

## 2016-09-19 DIAGNOSIS — C7951 Secondary malignant neoplasm of bone: Secondary | ICD-10-CM

## 2016-09-19 DIAGNOSIS — Z5111 Encounter for antineoplastic chemotherapy: Secondary | ICD-10-CM

## 2016-09-19 DIAGNOSIS — C649 Malignant neoplasm of unspecified kidney, except renal pelvis: Secondary | ICD-10-CM

## 2016-09-19 DIAGNOSIS — C642 Malignant neoplasm of left kidney, except renal pelvis: Secondary | ICD-10-CM

## 2016-09-19 LAB — COMPREHENSIVE METABOLIC PANEL
ALT: 13 U/L (ref 0–55)
AST: 10 U/L (ref 5–34)
Albumin: 4 g/dL (ref 3.5–5.0)
Alkaline Phosphatase: 67 U/L (ref 40–150)
Anion Gap: 9 mEq/L (ref 3–11)
BUN: 16.3 mg/dL (ref 7.0–26.0)
CO2: 26 mEq/L (ref 22–29)
Calcium: 10.4 mg/dL (ref 8.4–10.4)
Chloride: 102 mEq/L (ref 98–109)
Creatinine: 0.8 mg/dL (ref 0.7–1.3)
EGFR: 88 mL/min/{1.73_m2} — ABNORMAL LOW (ref >90)
Glucose: 115 mg/dl (ref 70–140)
Potassium: 4.2 mEq/L (ref 3.5–5.1)
Sodium: 138 mEq/L (ref 136–145)
Total Bilirubin: 0.47 mg/dL (ref 0.20–1.20)
Total Protein: 7.1 g/dL (ref 6.4–8.3)

## 2016-09-19 LAB — CBC WITH DIFFERENTIAL/PLATELET
BASO%: 0.5 % (ref 0.0–2.0)
Basophils Absolute: 0.1 10*3/uL (ref 0.0–0.1)
EOS%: 7.4 % — ABNORMAL HIGH (ref 0.0–7.0)
Eosinophils Absolute: 0.7 10*3/uL — ABNORMAL HIGH (ref 0.0–0.5)
HCT: 41.7 % (ref 38.4–49.9)
HGB: 13.6 g/dL (ref 13.0–17.1)
LYMPH%: 19.6 % (ref 14.0–49.0)
MCH: 28.5 pg (ref 27.2–33.4)
MCHC: 32.6 g/dL (ref 32.0–36.0)
MCV: 87.4 fL (ref 79.3–98.0)
MONO#: 1.1 10*3/uL — ABNORMAL HIGH (ref 0.1–0.9)
MONO%: 11.3 % (ref 0.0–14.0)
NEUT#: 5.9 10*3/uL (ref 1.5–6.5)
NEUT%: 61.2 % (ref 39.0–75.0)
Platelets: 220 10*3/uL (ref 140–400)
RBC: 4.77 10*6/uL (ref 4.20–5.82)
RDW: 13.7 % (ref 11.0–14.6)
WBC: 9.7 10*3/uL (ref 4.0–10.3)
lymph#: 1.9 10*3/uL (ref 0.9–3.3)

## 2016-09-19 MED ORDER — SODIUM CHLORIDE 0.9 % IV SOLN
Freq: Once | INTRAVENOUS | Status: AC
  Administered 2016-09-19: 13:00:00 via INTRAVENOUS

## 2016-09-19 MED ORDER — SODIUM CHLORIDE 0.9 % IV SOLN
240.0000 mg | Freq: Once | INTRAVENOUS | Status: AC
  Administered 2016-09-19: 240 mg via INTRAVENOUS
  Filled 2016-09-19: qty 24

## 2016-09-19 MED ORDER — SODIUM CHLORIDE 0.9% FLUSH
10.0000 mL | INTRAVENOUS | Status: DC | PRN
  Administered 2016-09-19: 10 mL
  Filled 2016-09-19: qty 10

## 2016-09-19 MED ORDER — HEPARIN SOD (PORK) LOCK FLUSH 100 UNIT/ML IV SOLN
500.0000 [IU] | Freq: Once | INTRAVENOUS | Status: AC | PRN
  Administered 2016-09-19: 500 [IU]
  Filled 2016-09-19: qty 5

## 2016-09-19 MED ORDER — SODIUM CHLORIDE 0.9% FLUSH
10.0000 mL | Freq: Once | INTRAVENOUS | Status: AC
  Administered 2016-09-19: 10 mL
  Filled 2016-09-19: qty 10

## 2016-09-19 NOTE — Progress Notes (Addendum)
[place] OFFICE PROGRESS NOTE   Diagnosis:  Renal cell carcinoma  INTERVAL HISTORY:   Phillip [name] returns as scheduled. He completed cycle 11 nivolumab 09/05/2016. He was started on hydrocortisone and testosterone replacement. He continues to have fatigue/malaise. Appetite is poor. Intermittent nausea. No diarrhea. No rash. For the past 2-3 weeks eyes have been red, mild associated pruritus. No visual disturbance. No eye pain. This has occurred intermittently over the past 2 years. In the past he has attributed this to an allergy to the "metal" in his eyeglass frame. He continues to have right leg and hip pain. Pain medications help.  Objective:  Vital signs in last 24 hours:  Blood pressure (!) 125/49, pulse 62, temperature 98.9 F (37.2 C), temperature source Oral, resp. rate 18, height 5\' 10"  (1.778 m), weight 246 lb 9.6 oz (111.9 kg), SpO2 100 %.    HEENT: Bilateral conjunctival/scleral erythema. Resp: Lungs clear bilaterally. Cardio: Regular rate and rhythm. GI: No hepatomegaly. Vascular: No leg edema.  Skin: No rash. Port-A-Cath without erythema.    Lab Results:  Lab Results  Component Value Date   WBC 8.9 09/05/2016   HGB 14.1 09/05/2016   HCT 42.8 09/05/2016   MCV 87.2 09/05/2016   PLT 191 09/05/2016   NEUTROABS 4.9 09/05/2016    Imaging:  No results found.  Medications: I have reviewed the patient's current medications.  Assessment/Plan: 1. Metastatic renal cell carcinoma   L4 mass with extraosseous extension, L4 nerve compression  Biopsy of the L4 mass 11/18/2015 confirmed metastatic renal cell carcinoma, clear cell type  CTs of the chest, abdomen, and pelvis 11/18/2015-right lower lobe nodule, expansile lytic lesion at the right 11th rib/costal vertebral junction, left renal mass, expansile lesion involving the L4 vertebra, lytic lesion at the left acetabulum, and a low-attenuation liver lesion  Initiation of SRS to L4 12/02/2015,  Completed 12/12/2015  Initiation of Pazopanib11/04/2015  Pazopanibplaced on hold 02/06/2016 secondary to elevated liver enzymes  Pazopanibresumed 03/07/2016 at a dose of 400 mg daily  Pazopanibdiscontinued 03/19/2016 secondary to elevated liver enzymes  Restaging CTs 04/02/2016-stable left renal mass, decreased soft tissue component associated with the L4 metastasis, increased soft tissue component associated with the right 11th rib metastasis with increased T11 bony destruction, increased sclerosis at the left acetabulum lesion  Cycle 1 nivolumab 04/12/2016  Cycle 2 nivolumab 04/26/2016  Cycle 3 nivolumab03/16/2018  Cycle 4 nivolumab 05/24/2016  Cycle 5 nivolumab 06/08/2016  MRI lumbar spine 06/21/2016-unchanged tumor at L3, increased size of retroperitoneal lymph nodes compared to a CT from 04/02/2016  Cycle 6 nivolumab 06/22/2016  CTs chest, abdomen, and pelvis 07/04/2016-enlargement of the left renal mass, right adrenal nodule, left hilar and peritoneal lymph nodes, enlargement of left acetabular lesion. Stable lung nodules.  Cycle 7 nivolumab05/12/2016   Cycle 8 nivolumab 07/20/2016  Cycle 9 nivolumab 08/02/2016  Cycle 10 nivolumab 08/20/2016  Restaging CT 09/03/2016 evaluation with stable disease  Cycle 11 nivolumab 09/05/2016  Cycle 12 nivolumab 09/19/2016 2. Pain secondary to #1 3. Hypertension 4. Elevated transaminases 02/06/2016-Pazopanibplaced on hold  Liver enzymes normal 03/07/2016 5. Port-A-Cath placement 05/16/2016 6. Malaise/anorexia 09/05/2016. Cortisol and testosterone levels are low. Hydrocortisone and testosterone replacement initiated. 7. Conjunctival/scleral erythema 09/19/2016.    Disposition: Phillip [name] appears unchanged. He has completed 11 cycles of nivolumab. Plan to proceed with cycle 12 today as scheduled.  He continues to have malaise and anorexia. He will continue hydrocortisone and testosterone replacement.  He is  noted to have conjunctival/scleral erythema at today's  visit. He reports this has occurred intermittently over the past 2 years. He has no visual disturbance and no eye pain. He will contact his eye doctor for an appointment. We discussed the possibility that the current presentation is related to nivolumab.  He will return for a follow-up visit and nivolumab in 2 weeks. He will contact the office in the interim with any problems.  Patient seen with Dr. [name].    [name] ANP/GNP-BC   09/19/2016  12:04 PM  This was a shared visit with [name]. Phillip [name] was interviewed and examined. 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 Hopefully the fatigue will improve with hormone replacement. He may have uncommon ophthalmologic toxicity. He will schedule an appointment with ophthalmology earlier this week. He reports having the same eye erythema prior to beginning nivolumab.  Julieanne Manson, M.D.

## 2016-09-19 NOTE — Patient Instructions (Signed)
[facility] Discharge Instructions for Patients Receiving Chemotherapy  Today you received the following chemotherapy agents:  Nivolumab.  To help prevent nausea and vomiting after your treatment, we encourage you to take your nausea medication as directed.   If you develop nausea and vomiting that is not controlled by your nausea medication, call the clinic.   BELOW ARE SYMPTOMS THAT SHOULD BE REPORTED IMMEDIATELY:  *FEVER GREATER THAN 100.5 F  *CHILLS WITH OR WITHOUT FEVER  NAUSEA AND VOMITING THAT IS NOT CONTROLLED WITH YOUR NAUSEA MEDICATION  *UNUSUAL SHORTNESS OF BREATH  *UNUSUAL BRUISING OR BLEEDING  TENDERNESS IN MOUTH AND THROAT WITH OR WITHOUT PRESENCE OF ULCERS  *URINARY PROBLEMS  *BOWEL PROBLEMS  UNUSUAL RASH Items with * indicate a potential emergency and should be followed up as soon as possible.  Feel free to call the clinic you have any questions or concerns. The clinic phone number is [phone].  Please show the CHEMO ALERT CARD at check-in to the Emergency Department and triage nurse.   

## 2016-09-20 DIAGNOSIS — I10 Essential (primary) hypertension: Secondary | ICD-10-CM | POA: Diagnosis not present

## 2016-09-20 DIAGNOSIS — Z6835 Body mass index (BMI) 35.0-35.9, adult: Secondary | ICD-10-CM | POA: Diagnosis not present

## 2016-09-20 DIAGNOSIS — M5416 Radiculopathy, lumbar region: Secondary | ICD-10-CM | POA: Diagnosis not present

## 2016-09-20 DIAGNOSIS — C7951 Secondary malignant neoplasm of bone: Secondary | ICD-10-CM | POA: Diagnosis not present

## 2016-09-26 ENCOUNTER — Telehealth: Payer: Self-pay

## 2016-09-26 NOTE — Telephone Encounter (Signed)
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 He thought the low cortisol levels is allowing him to have this autoimmune response. He rx prednisone eye drops. [name] is making sure this is OK with Dr [name]

## 2016-09-27 ENCOUNTER — Telehealth: Payer: Self-pay | Admitting: *Deleted

## 2016-09-27 NOTE — Telephone Encounter (Signed)
Discussed pt's call with Dr. [name]: OK to use steroid eyedrops. Left message requesting pt call with eye Dr.'s phone # to get the office note.

## 2016-09-28 NOTE — Telephone Encounter (Signed)
Phillip [name] called with optometrist. Dr Jacqualin Combes at [facility]. Phone [phone]. This RN called and requested ov note. Dr Jacqualin Combes is finishing note. Fax number for Dr [name] given.

## 2016-09-30 ENCOUNTER — Other Ambulatory Visit: Payer: Self-pay | Admitting: Oncology

## 2016-10-03 ENCOUNTER — Other Ambulatory Visit: Payer: Self-pay

## 2016-10-03 ENCOUNTER — Ambulatory Visit (HOSPITAL_BASED_OUTPATIENT_CLINIC_OR_DEPARTMENT_OTHER): Payer: PPO

## 2016-10-03 ENCOUNTER — Telehealth: Payer: Self-pay | Admitting: Oncology

## 2016-10-03 ENCOUNTER — Ambulatory Visit: Payer: PPO | Admitting: Nutrition

## 2016-10-03 ENCOUNTER — Other Ambulatory Visit: Payer: PPO

## 2016-10-03 ENCOUNTER — Ambulatory Visit (HOSPITAL_BASED_OUTPATIENT_CLINIC_OR_DEPARTMENT_OTHER): Payer: PPO | Admitting: Oncology

## 2016-10-03 VITALS — BP 134/62 | HR 71 | Temp 99.0°F | Resp 18 | Ht 70.0 in | Wt 244.4 lb

## 2016-10-03 DIAGNOSIS — C641 Malignant neoplasm of right kidney, except renal pelvis: Secondary | ICD-10-CM

## 2016-10-03 DIAGNOSIS — C649 Malignant neoplasm of unspecified kidney, except renal pelvis: Secondary | ICD-10-CM | POA: Diagnosis not present

## 2016-10-03 DIAGNOSIS — Z5112 Encounter for antineoplastic immunotherapy: Secondary | ICD-10-CM | POA: Diagnosis not present

## 2016-10-03 DIAGNOSIS — G893 Neoplasm related pain (acute) (chronic): Secondary | ICD-10-CM | POA: Diagnosis not present

## 2016-10-03 DIAGNOSIS — C7951 Secondary malignant neoplasm of bone: Secondary | ICD-10-CM

## 2016-10-03 DIAGNOSIS — C7949 Secondary malignant neoplasm of other parts of nervous system: Secondary | ICD-10-CM | POA: Diagnosis not present

## 2016-10-03 DIAGNOSIS — C642 Malignant neoplasm of left kidney, except renal pelvis: Secondary | ICD-10-CM

## 2016-10-03 MED ORDER — HYDROCORTISONE 10 MG PO TABS: ORAL_TABLET | ORAL | 0 refills | Status: DC

## 2016-10-03 MED ORDER — SODIUM CHLORIDE 0.9 % IV SOLN
Freq: Once | INTRAVENOUS | Status: AC
  Administered 2016-10-03: 14:00:00 via INTRAVENOUS

## 2016-10-03 MED ORDER — SODIUM CHLORIDE 0.9 % IV SOLN
240.0000 mg | Freq: Once | INTRAVENOUS | Status: AC
  Administered 2016-10-03: 240 mg via INTRAVENOUS
  Filled 2016-10-03: qty 24

## 2016-10-03 MED ORDER — SODIUM CHLORIDE 0.9% FLUSH
10.0000 mL | INTRAVENOUS | Status: DC | PRN
  Administered 2016-10-03: 10 mL
  Filled 2016-10-03: qty 10

## 2016-10-03 MED ORDER — ONDANSETRON HCL 8 MG PO TABS: 8.0000 mg | ORAL_TABLET | Freq: Three times a day (TID) | ORAL | 0 refills | Status: DC | PRN

## 2016-10-03 MED ORDER — HEPARIN SOD (PORK) LOCK FLUSH 100 UNIT/ML IV SOLN
500.0000 [IU] | Freq: Once | INTRAVENOUS | Status: AC | PRN
  Administered 2016-10-03: 500 [IU]
  Filled 2016-10-03: qty 5

## 2016-10-03 NOTE — Progress Notes (Signed)
Patient was identified to be at risk for malnutrition on the MST secondary to weight loss and poor appetite.  71 year old male diagnosed with metastatic renal cell cancer.  He is a patient of Dr. Julieanne Manson.  Past medical history includes hypertension.  Medications include Colace, Ativan, Zofran, MiraLAX.  Labs include glucose 159 on July 11.  Height: 5 feet 10 inches. Weight: 244.4 pounds. Usual body weight: 250-255 pounds. BMI: 36.07.  Patient reports he did have a bit of nausea however this has improved by taking nausea medication. Now reports no nausea, during the day. Patient endorses poor appetite.  He states he has continued to eat 3 meals daily. He denies issues with constipation or diarrhea. He has been drinking protein shakes and milkshakes on occasion.  Nutrition diagnosis:  Unintended weight loss related to metastatic renal cell cancer and associated treatments as evidenced by 2% weight loss from usual body weight.  Intervention: Educated patient on high-protein foods and encouraged small frequent meals and snacks. Strategies for improving nausea. Provided fact sheets. Questions were answered.  Teach back method used.  Contact information provided.  Monitoring, evaluation, goals: Patient will tolerate adequate calories and protein to minimize weight loss.  No follow-up necessary at this time.  Patient has my contact information for questions or concerns.  **Disclaimer: This note was dictated with voice recognition software. Similar sounding words can inadvertently be transcribed and this note may contain transcription errors which may not have been corrected upon publication of note.**

## 2016-10-03 NOTE — Addendum Note (Signed)
Addended by: Betsy Coder B on: 10/03/2016 12:45 PM   Modules accepted: Orders

## 2016-10-03 NOTE — Progress Notes (Signed)
[facility] OFFICE PROGRESS NOTE   Diagnosis: Renal cell carcinoma  INTERVAL HISTORY:   Mr. Wuthrich returns for scheduled visit. He saw optometry was diagnosed with episcleritis. The eye and lytic erythema have resolved with steroid drops. He has noted improvement in his energy level over the past few weeks. He is now taking OxyContin and oxycodone for pain. He takes OxyContin infrequently for breakthrough pain. He fell at church last week. No other falls. No dyspnea. No rash or diarrhea.  Objective:  Vital signs in last 24 hours:  Blood pressure 134/62, pulse 71, temperature 99 F (37.2 C), temperature source Oral, resp. rate 18, height 5\' 10"  (1.778 m), weight 244 lb 6.4 oz (110.9 kg), SpO2 98 %.    HEENT: No thrush or ulcers, no conjunctival or scleral erythema Resp: Lungs clear bilaterally Cardio: Regular rate and rhythm GI: No hepatomegaly, nontender Vascular: No leg edema Neuro: Alert and oriented  Skin: No rash   Portacath/PICC-without erythema  Lab Results:  Lab Results  Component Value Date   WBC 9.7 09/19/2016   HGB 13.6 09/19/2016   HCT 41.7 09/19/2016   MCV 87.4 09/19/2016   PLT 220 09/19/2016   NEUTROABS 5.9 09/19/2016    CMP     Component Value Date/Time   NA 138 09/19/2016 1026   K 4.2 09/19/2016 1026   CO2 26 09/19/2016 1026   GLUCOSE 115 09/19/2016 1026   BUN 16.3 09/19/2016 1026   CREATININE 0.8 09/19/2016 1026   CALCIUM 10.4 09/19/2016 1026   PROT 7.1 09/19/2016 1026   ALBUMIN 4.0 09/19/2016 1026   AST 10 09/19/2016 1026   ALT 13 09/19/2016 1026   ALKPHOS 67 09/19/2016 1026   BILITOT 0.47 09/19/2016 1026      Medications: I have reviewed the patient's current medications.  Assessment/Plan: 1. Metastatic renal cell carcinoma   L4 mass with extraosseous extension, L4 nerve compression  Biopsy of the L4 mass 11/18/2015 confirmed metastatic renal cell carcinoma, clear cell type  CTs of the chest, abdomen, and pelvis  11/18/2015-right lower lobe nodule, expansile lytic lesion at the right 11th rib/costal vertebral junction, left renal mass, expansile lesion involving the L4 vertebra, lytic lesion at the left acetabulum, and a low-attenuation liver lesion  Initiation of SRS to L4 12/02/2015, Completed 12/12/2015  Initiation of Pazopanib11/04/2015  Pazopanibplaced on hold 02/06/2016 secondary to elevated liver enzymes  Pazopanibresumed 03/07/2016 at a dose of 400 mg daily  Pazopanibdiscontinued 03/19/2016 secondary to elevated liver enzymes  Restaging CTs 04/02/2016-stable left renal mass, decreased soft tissue component associated with the L4 metastasis, increased soft tissue component associated with the right 11th rib metastasis with increased T11 bony destruction, increased sclerosis at the left acetabulum lesion  Cycle 1 nivolumab 04/12/2016  Cycle 2 nivolumab 04/26/2016  Cycle 3 nivolumab03/16/2018  Cycle 4 nivolumab 05/24/2016  Cycle 5 nivolumab 06/08/2016  MRI lumbar spine 06/21/2016-unchanged tumor at L3, increased size of retroperitoneal lymph nodes compared to a CT from 04/02/2016  Cycle 6 nivolumab 06/22/2016  CTs chest, abdomen, and pelvis 07/04/2016-enlargement of the left renal mass, right adrenal nodule, left hilar and peritoneal lymph nodes, enlargement of left acetabular lesion. Stable lung nodules.  Cycle 7 nivolumab05/12/2016   Cycle 8 nivolumab 07/20/2016  Cycle 9 nivolumab 08/02/2016  Cycle 10 nivolumab 08/20/2016  Restaging CT 09/03/2016 evaluation with stable disease  Cycle 11 nivolumab 09/05/2016  Cycle 12 nivolumab 09/19/2016  Cycle 13 nivolumab 10/03/2016 2. Pain secondary to #1 3. Hypertension 4. Elevated transaminases 02/06/2016-Pazopanibplaced on hold  Liver enzymes normal 03/07/2016 5. Port-A-Cath placement 05/16/2016 6. Malaise/anorexia 09/05/2016. Cortisol and testosterone levels are low. Hydrocortisone and testosterone replacement  initiated. 7. Conjunctival/scleral erythema 09/19/2016-resolved with steroid eyedrops     Disposition:  Mr. Quebedeaux has an improved performance status. I suspect this is related to hormone replacement therapy. He will continue hydrocortisone and testosterone.  The eye erythema has resolved. It is unclear whether this was related to nivolumab. He will taper the steroid eyedrops as recommended by optometry.  Mr. Caligiuri will continue the current narcotic regimen. He will return for an office visit in 2 weeks.  15 minutes were spent with the patient today. The majority of the time was used for counseling and coordination of care.    Donneta Romberg, MD  10/03/2016  11:38 AM

## 2016-10-03 NOTE — Progress Notes (Signed)
Okay to treat today without labs, per Dr. [name].

## 2016-10-03 NOTE — Telephone Encounter (Signed)
Gave relative avs report and appointments for August and September.  °

## 2016-10-03 NOTE — Patient Instructions (Signed)
[facility]cer Center Discharge Instructions for Patients Receiving Chemotherapy  Today you received the following chemotherapy agents Nivolumab  To help prevent nausea and vomiting after your treatment, we encourage you to take your nausea medication     If you develop nausea and vomiting that is not controlled by your nausea medication, call the clinic.   BELOW ARE SYMPTOMS THAT SHOULD BE REPORTED IMMEDIATELY:  *FEVER GREATER THAN 100.5 F  *CHILLS WITH OR WITHOUT FEVER  NAUSEA AND VOMITING THAT IS NOT CONTROLLED WITH YOUR NAUSEA MEDICATION  *UNUSUAL SHORTNESS OF BREATH  *UNUSUAL BRUISING OR BLEEDING  TENDERNESS IN MOUTH AND THROAT WITH OR WITHOUT PRESENCE OF ULCERS  *URINARY PROBLEMS  *BOWEL PROBLEMS  UNUSUAL RASH Items with * indicate a potential emergency and should be followed up as soon as possible.  Feel free to call the clinic you have any questions or concerns. The clinic phone number is [phone].  Please show the CHEMO ALERT CARD at check-in to the Emergency Department and triage nurse.   

## 2016-10-04 DIAGNOSIS — M6281 Muscle weakness (generalized): Secondary | ICD-10-CM | POA: Diagnosis not present

## 2016-10-04 DIAGNOSIS — R531 Weakness: Secondary | ICD-10-CM | POA: Diagnosis not present

## 2016-10-10 DIAGNOSIS — R531 Weakness: Secondary | ICD-10-CM | POA: Diagnosis not present

## 2016-10-10 DIAGNOSIS — M6281 Muscle weakness (generalized): Secondary | ICD-10-CM | POA: Diagnosis not present

## 2016-10-14 ENCOUNTER — Other Ambulatory Visit: Payer: Self-pay | Admitting: Oncology

## 2016-10-15 ENCOUNTER — Other Ambulatory Visit: Payer: Self-pay | Admitting: *Deleted

## 2016-10-15 ENCOUNTER — Telehealth: Payer: Self-pay

## 2016-10-15 DIAGNOSIS — C649 Malignant neoplasm of unspecified kidney, except renal pelvis: Secondary | ICD-10-CM

## 2016-10-15 MED ORDER — ONDANSETRON HCL 8 MG PO TABS: 8.0000 mg | ORAL_TABLET | Freq: Three times a day (TID) | ORAL | 1 refills | Status: DC | PRN

## 2016-10-15 NOTE — Telephone Encounter (Signed)
[name] say pt is taking zofran before bed when he gets up. He does have nausea if he does not take it. They are asking for rx with >20 tabs.

## 2016-10-16 DIAGNOSIS — R531 Weakness: Secondary | ICD-10-CM | POA: Diagnosis not present

## 2016-10-16 DIAGNOSIS — M6281 Muscle weakness (generalized): Secondary | ICD-10-CM | POA: Diagnosis not present

## 2016-10-17 ENCOUNTER — Ambulatory Visit (HOSPITAL_BASED_OUTPATIENT_CLINIC_OR_DEPARTMENT_OTHER): Payer: PPO | Admitting: Oncology

## 2016-10-17 ENCOUNTER — Other Ambulatory Visit (HOSPITAL_BASED_OUTPATIENT_CLINIC_OR_DEPARTMENT_OTHER): Payer: PPO

## 2016-10-17 ENCOUNTER — Telehealth: Payer: Self-pay | Admitting: Oncology

## 2016-10-17 ENCOUNTER — Ambulatory Visit: Payer: PPO

## 2016-10-17 ENCOUNTER — Ambulatory Visit (HOSPITAL_BASED_OUTPATIENT_CLINIC_OR_DEPARTMENT_OTHER): Payer: PPO

## 2016-10-17 VITALS — BP 131/65 | HR 58 | Temp 99.0°F | Resp 18 | Ht 70.0 in | Wt 245.7 lb

## 2016-10-17 DIAGNOSIS — C641 Malignant neoplasm of right kidney, except renal pelvis: Secondary | ICD-10-CM

## 2016-10-17 DIAGNOSIS — Z95828 Presence of other vascular implants and grafts: Secondary | ICD-10-CM

## 2016-10-17 DIAGNOSIS — C649 Malignant neoplasm of unspecified kidney, except renal pelvis: Secondary | ICD-10-CM

## 2016-10-17 DIAGNOSIS — C642 Malignant neoplasm of left kidney, except renal pelvis: Secondary | ICD-10-CM

## 2016-10-17 DIAGNOSIS — C7951 Secondary malignant neoplasm of bone: Secondary | ICD-10-CM | POA: Diagnosis not present

## 2016-10-17 DIAGNOSIS — Z5111 Encounter for antineoplastic chemotherapy: Secondary | ICD-10-CM

## 2016-10-17 LAB — COMPREHENSIVE METABOLIC PANEL
ALT: 14 U/L (ref 0–55)
AST: 12 U/L (ref 5–34)
Albumin: 4 g/dL (ref 3.5–5.0)
Alkaline Phosphatase: 70 U/L (ref 40–150)
Anion Gap: 8 mEq/L (ref 3–11)
BUN: 16.7 mg/dL (ref 7.0–26.0)
CO2: 28 mEq/L (ref 22–29)
Calcium: 10.6 mg/dL — ABNORMAL HIGH (ref 8.4–10.4)
Chloride: 101 mEq/L (ref 98–109)
Creatinine: 0.8 mg/dL (ref 0.7–1.3)
EGFR: 89 mL/min/{1.73_m2} — ABNORMAL LOW (ref >90)
Glucose: 129 mg/dl (ref 70–140)
Potassium: 4.2 mEq/L (ref 3.5–5.1)
Sodium: 137 mEq/L (ref 136–145)
Total Bilirubin: 0.5 mg/dL (ref 0.20–1.20)
Total Protein: 7.5 g/dL (ref 6.4–8.3)

## 2016-10-17 LAB — CBC WITH DIFFERENTIAL/PLATELET
BASO%: 0.5 % (ref 0.0–2.0)
Basophils Absolute: 0.1 10*3/uL (ref 0.0–0.1)
EOS%: 6.6 % (ref 0.0–7.0)
Eosinophils Absolute: 0.7 10*3/uL — ABNORMAL HIGH (ref 0.0–0.5)
HCT: 42.3 % (ref 38.4–49.9)
HGB: 13.8 g/dL (ref 13.0–17.1)
LYMPH%: 15.6 % (ref 14.0–49.0)
MCH: 28.6 pg (ref 27.2–33.4)
MCHC: 32.6 g/dL (ref 32.0–36.0)
MCV: 87.8 fL (ref 79.3–98.0)
MONO#: 1 10*3/uL — ABNORMAL HIGH (ref 0.1–0.9)
MONO%: 9.6 % (ref 0.0–14.0)
NEUT#: 7.2 10*3/uL — ABNORMAL HIGH (ref 1.5–6.5)
NEUT%: 67.7 % (ref 39.0–75.0)
Platelets: 216 10*3/uL (ref 140–400)
RBC: 4.82 10*6/uL (ref 4.20–5.82)
RDW: 13.7 % (ref 11.0–14.6)
WBC: 10.6 10*3/uL — ABNORMAL HIGH (ref 4.0–10.3)
lymph#: 1.7 10*3/uL (ref 0.9–3.3)

## 2016-10-17 LAB — TSH: TSH: 0.35 m(IU)/L (ref 0.320–4.118)

## 2016-10-17 MED ORDER — SODIUM CHLORIDE 0.9 % IV SOLN
Freq: Once | INTRAVENOUS | Status: AC
  Administered 2016-10-17: 13:00:00 via INTRAVENOUS

## 2016-10-17 MED ORDER — HEPARIN SOD (PORK) LOCK FLUSH 100 UNIT/ML IV SOLN
500.0000 [IU] | Freq: Once | INTRAVENOUS | Status: AC | PRN
  Administered 2016-10-17: 500 [IU]
  Filled 2016-10-17: qty 5

## 2016-10-17 MED ORDER — SODIUM CHLORIDE 0.9% FLUSH
10.0000 mL | INTRAVENOUS | Status: DC | PRN
  Administered 2016-10-17: 10 mL
  Filled 2016-10-17: qty 10

## 2016-10-17 MED ORDER — NIVOLUMAB CHEMO INJECTION 100 MG/10ML
240.0000 mg | Freq: Once | INTRAVENOUS | Status: AC
  Administered 2016-10-17: 240 mg via INTRAVENOUS
  Filled 2016-10-17: qty 24

## 2016-10-17 MED ORDER — SODIUM CHLORIDE 0.9% FLUSH
10.0000 mL | Freq: Once | INTRAVENOUS | Status: AC
  Administered 2016-10-17: 10 mL
  Filled 2016-10-17: qty 10

## 2016-10-17 NOTE — Telephone Encounter (Signed)
Gave patient avs and calendar for upcoming appts.  °

## 2016-10-17 NOTE — Progress Notes (Signed)
[facility] OFFICE PROGRESS NOTE   Diagnosis: Renal cell carcinoma  INTERVAL HISTORY:   Phillip [name] returns as scheduled. He completed another treatment with Nivolumab on 10/03/2016. He reports stable back/thigh pain. He continues to have a diminished energy level, but this has improved since beginning testosterone and cortisone replacement. The eye erythema has resolved. He is taking a steroid drops once daily.  Objective:  Vital signs in last 24 hours:  Blood pressure 131/65, pulse (!) 58, temperature 99 F (37.2 C), temperature source Oral, resp. rate 18, height 5\' 10"  (1.778 m), weight 245 lb 11.2 oz (111.4 kg), SpO2 98 %.    HEENT: No thrush. No conjunctival erythema Resp: Lungs clear bilaterally Cardio: Regular rate and rhythm GI: No hepatosplenomegaly, nontender Vascular: No leg edema   Portacath/PICC-without erythema  Lab Results:  Lab Results  Component Value Date   WBC 10.6 (H) 10/17/2016   HGB 13.8 10/17/2016   HCT 42.3 10/17/2016   MCV 87.8 10/17/2016   PLT 216 10/17/2016   NEUTROABS 7.2 (H) 10/17/2016    CMP     Component Value Date/Time   NA 138 09/19/2016 1026   K 4.2 09/19/2016 1026   CO2 26 09/19/2016 1026   GLUCOSE 115 09/19/2016 1026   BUN 16.3 09/19/2016 1026   CREATININE 0.8 09/19/2016 1026   CALCIUM 10.4 09/19/2016 1026   PROT 7.1 09/19/2016 1026   ALBUMIN 4.0 09/19/2016 1026   AST 10 09/19/2016 1026   ALT 13 09/19/2016 1026   ALKPHOS 67 09/19/2016 1026   BILITOT 0.47 09/19/2016 1026     Medications: I have reviewed the patient's current medications.  Assessment/Plan: 1. Metastatic renal cell carcinoma   L4 mass with extraosseous extension, L4 nerve compression  Biopsy of the L4 mass 11/18/2015 confirmed metastatic renal cell carcinoma, clear cell type  CTs of the chest, abdomen, and pelvis 11/18/2015-right lower lobe nodule, expansile lytic lesion at the right 11th rib/costal vertebral junction, left renal mass,  expansile lesion involving the L4 vertebra, lytic lesion at the left acetabulum, and a low-attenuation liver lesion  Initiation of SRS to L4 12/02/2015, Completed 12/12/2015  Initiation of Pazopanib11/04/2015  Pazopanibplaced on hold 02/06/2016 secondary to elevated liver enzymes  Pazopanibresumed 03/07/2016 at a dose of 400 mg daily  Pazopanibdiscontinued 03/19/2016 secondary to elevated liver enzymes  Restaging CTs 04/02/2016-stable left renal mass, decreased soft tissue component associated with the L4 metastasis, increased soft tissue component associated with the right 11th rib metastasis with increased T11 bony destruction, increased sclerosis at the left acetabulum lesion  Cycle 1 nivolumab 04/12/2016  Cycle 2 nivolumab 04/26/2016  Cycle 3 nivolumab03/16/2018  Cycle 4 nivolumab 05/24/2016  Cycle 5 nivolumab 06/08/2016  MRI lumbar spine 06/21/2016-unchanged tumor at L3, increased size of retroperitoneal lymph nodes compared to a CT from 04/02/2016  Cycle 6 nivolumab 06/22/2016  CTs chest, abdomen, and pelvis 07/04/2016-enlargement of the left renal mass, right adrenal nodule, left hilar and peritoneal lymph nodes, enlargement of left acetabular lesion. Stable lung nodules.  Cycle 7 nivolumab05/12/2016   Cycle 8 nivolumab 07/20/2016  Cycle 9 nivolumab 08/02/2016  Cycle 10 nivolumab 08/20/2016  Restaging CT 09/03/2016 evaluation with stable disease  Cycle 11 nivolumab 09/05/2016  Cycle 12 nivolumab 09/19/2016  Cycle 13 nivolumab 10/03/2016  Cycle 14 nivolumab 10/17/2016 2. Pain secondary to #1 3. Hypertension 4. Elevated transaminases 02/06/2016-Pazopanibplaced on hold  Liver enzymes normal 03/07/2016 5. Port-A-Cath placement 05/16/2016 6. Malaise/anorexia 09/05/2016. Cortisol and testosterone levels are low. Hydrocortisone and testosterone replacement initiated. 7.  Conjunctival/scleral erythema 09/19/2016-resolved with steroid  eyedrops   Disposition:  Phillip [name] appears unchanged. The plan is to continue nivolumab. We will switch to every 4 week dosing if he can obtain insurance approval. We will plan for a restaging CT evaluation in 1-2 months.  15 minutes were spent with the patient today. The majority of time was used for counseling and coordination of care.  He continues follow-up with neurosurgery for management of the back pain.     Donneta Romberg, MD  10/17/2016  12:18 PM

## 2016-10-17 NOTE — Patient Instructions (Signed)
[facility] Discharge Instructions for Patients Receiving Chemotherapy  Today you received the following chemotherapy agents Nivolumab  To help prevent nausea and vomiting after your treatment, we encourage you to take your nausea medication     If you develop nausea and vomiting that is not controlled by your nausea medication, call the clinic.   BELOW ARE SYMPTOMS THAT SHOULD BE REPORTED IMMEDIATELY:  *FEVER GREATER THAN 100.5 F  *CHILLS WITH OR WITHOUT FEVER  NAUSEA AND VOMITING THAT IS NOT CONTROLLED WITH YOUR NAUSEA MEDICATION  *UNUSUAL SHORTNESS OF BREATH  *UNUSUAL BRUISING OR BLEEDING  TENDERNESS IN MOUTH AND THROAT WITH OR WITHOUT PRESENCE OF ULCERS  *URINARY PROBLEMS  *BOWEL PROBLEMS  UNUSUAL RASH Items with * indicate a potential emergency and should be followed up as soon as possible.  Feel free to call the clinic you have any questions or concerns. The clinic phone number is [phone].  Please show the CHEMO ALERT CARD at check-in to the Emergency Department and triage nurse.   

## 2016-10-17 NOTE — Patient Instructions (Signed)

## 2016-10-18 ENCOUNTER — Emergency Department (HOSPITAL_COMMUNITY): Payer: PPO

## 2016-10-18 ENCOUNTER — Encounter (HOSPITAL_COMMUNITY): Payer: Self-pay | Admitting: Emergency Medicine

## 2016-10-18 ENCOUNTER — Inpatient Hospital Stay (HOSPITAL_COMMUNITY)
Admission: EM | Admit: 2016-10-18 | Discharge: 2016-10-20 | DRG: 543 | Disposition: A | Discharge status: Home Health | Payer: PPO | Attending: Family Medicine | Admitting: Family Medicine

## 2016-10-18 DIAGNOSIS — I10 Essential (primary) hypertension: Secondary | ICD-10-CM

## 2016-10-18 DIAGNOSIS — Z923 Personal history of irradiation: Secondary | ICD-10-CM | POA: Diagnosis not present

## 2016-10-18 DIAGNOSIS — C7951 Secondary malignant neoplasm of bone: Secondary | ICD-10-CM | POA: Diagnosis not present

## 2016-10-18 DIAGNOSIS — Z79891 Long term (current) use of opiate analgesic: Secondary | ICD-10-CM

## 2016-10-18 DIAGNOSIS — C641 Malignant neoplasm of right kidney, except renal pelvis: Secondary | ICD-10-CM | POA: Diagnosis present

## 2016-10-18 DIAGNOSIS — M5416 Radiculopathy, lumbar region: Secondary | ICD-10-CM | POA: Diagnosis present

## 2016-10-18 DIAGNOSIS — Z85528 Personal history of other malignant neoplasm of kidney: Secondary | ICD-10-CM | POA: Diagnosis not present

## 2016-10-18 DIAGNOSIS — Z79899 Other long term (current) drug therapy: Secondary | ICD-10-CM | POA: Diagnosis not present

## 2016-10-18 DIAGNOSIS — Y9301 Activity, walking, marching and hiking: Secondary | ICD-10-CM | POA: Diagnosis present

## 2016-10-18 DIAGNOSIS — G9520 Unspecified cord compression: Secondary | ICD-10-CM | POA: Diagnosis not present

## 2016-10-18 DIAGNOSIS — M47816 Spondylosis without myelopathy or radiculopathy, lumbar region: Secondary | ICD-10-CM | POA: Diagnosis not present

## 2016-10-18 DIAGNOSIS — R29898 Other symptoms and signs involving the musculoskeletal system: Secondary | ICD-10-CM | POA: Diagnosis not present

## 2016-10-18 DIAGNOSIS — M6281 Muscle weakness (generalized): Secondary | ICD-10-CM | POA: Diagnosis not present

## 2016-10-18 DIAGNOSIS — R296 Repeated falls: Secondary | ICD-10-CM | POA: Diagnosis not present

## 2016-10-18 DIAGNOSIS — C7949 Secondary malignant neoplasm of other parts of nervous system: Secondary | ICD-10-CM | POA: Diagnosis not present

## 2016-10-18 DIAGNOSIS — Y92009 Unspecified place in unspecified non-institutional (private) residence as the place of occurrence of the external cause: Secondary | ICD-10-CM | POA: Diagnosis not present

## 2016-10-18 DIAGNOSIS — R52 Pain, unspecified: Secondary | ICD-10-CM

## 2016-10-18 DIAGNOSIS — F40240 Claustrophobia: Secondary | ICD-10-CM | POA: Diagnosis not present

## 2016-10-18 DIAGNOSIS — E2740 Unspecified adrenocortical insufficiency: Secondary | ICD-10-CM | POA: Diagnosis present

## 2016-10-18 DIAGNOSIS — R269 Unspecified abnormalities of gait and mobility: Secondary | ICD-10-CM | POA: Diagnosis not present

## 2016-10-18 DIAGNOSIS — R630 Anorexia: Secondary | ICD-10-CM | POA: Diagnosis present

## 2016-10-18 DIAGNOSIS — G8929 Other chronic pain: Secondary | ICD-10-CM | POA: Diagnosis present

## 2016-10-18 DIAGNOSIS — C649 Malignant neoplasm of unspecified kidney, except renal pelvis: Secondary | ICD-10-CM

## 2016-10-18 DIAGNOSIS — M545 Low back pain: Secondary | ICD-10-CM | POA: Diagnosis not present

## 2016-10-18 DIAGNOSIS — W010XXA Fall on same level from slipping, tripping and stumbling without subsequent striking against object, initial encounter: Secondary | ICD-10-CM | POA: Diagnosis present

## 2016-10-18 DIAGNOSIS — Z807 Family history of other malignant neoplasms of lymphoid, hematopoietic and related tissues: Secondary | ICD-10-CM | POA: Diagnosis not present

## 2016-10-18 DIAGNOSIS — M48061 Spinal stenosis, lumbar region without neurogenic claudication: Secondary | ICD-10-CM | POA: Diagnosis present

## 2016-10-18 HISTORY — DX: Malignant (primary) neoplasm, unspecified: C80.1

## 2016-10-18 LAB — CK: Total CK: 65 U/L (ref 49–397)

## 2016-10-18 LAB — URINALYSIS, ROUTINE W REFLEX MICROSCOPIC
Bilirubin Urine: NEGATIVE
Glucose, UA: NEGATIVE mg/dL
Hgb urine dipstick: NEGATIVE
Ketones, ur: NEGATIVE mg/dL
Leukocytes, UA: NEGATIVE
Nitrite: NEGATIVE
Protein, ur: NEGATIVE mg/dL
Specific Gravity, Urine: 1.021 (ref 1.005–1.030)
pH: 6 (ref 5.0–8.0)

## 2016-10-18 LAB — CBC
HCT: 40 % (ref 39.0–52.0)
Hemoglobin: 13.1 g/dL (ref 13.0–17.0)
MCH: 27.6 pg (ref 26.0–34.0)
MCHC: 32.8 g/dL (ref 30.0–36.0)
MCV: 84.4 fL (ref 78.0–100.0)
Platelets: 207 10*3/uL (ref 150–400)
RBC: 4.74 MIL/uL (ref 4.22–5.81)
RDW: 13.4 % (ref 11.5–15.5)
WBC: 9.5 10*3/uL (ref 4.0–10.5)

## 2016-10-18 LAB — CORTISOL: Cortisol, Plasma: 8.8 ug/dL

## 2016-10-18 LAB — COMPREHENSIVE METABOLIC PANEL
ALT: 17 U/L (ref 17–63)
AST: 16 U/L (ref 15–41)
Albumin: 4 g/dL (ref 3.5–5.0)
Alkaline Phosphatase: 57 U/L (ref 38–126)
Anion gap: 9 (ref 5–15)
BUN: 16 mg/dL (ref 6–20)
CO2: 27 mmol/L (ref 22–32)
Calcium: 9.8 mg/dL (ref 8.9–10.3)
Chloride: 103 mmol/L (ref 101–111)
Creatinine, Ser: 0.66 mg/dL (ref 0.61–1.24)
GFR calc Af Amer: >60 mL/min (ref >60)
GFR calc non Af Amer: >60 mL/min (ref >60)
Glucose, Bld: 130 mg/dL — ABNORMAL HIGH (ref 65–99)
Potassium: 4.2 mmol/L (ref 3.5–5.1)
Sodium: 139 mmol/L (ref 135–145)
Total Bilirubin: 0.5 mg/dL (ref 0.3–1.2)
Total Protein: 6.8 g/dL (ref 6.5–8.1)

## 2016-10-18 LAB — CREATININE, SERUM
Creatinine, Ser: 0.76 mg/dL (ref 0.61–1.24)
GFR calc Af Amer: >60 mL/min (ref >60)
GFR calc non Af Amer: >60 mL/min (ref >60)

## 2016-10-18 LAB — CBC WITH DIFFERENTIAL/PLATELET
Basophils Absolute: 0 10*3/uL (ref 0.0–0.1)
Basophils Relative: 0 %
Eosinophils Absolute: 0.6 10*3/uL (ref 0.0–0.7)
Eosinophils Relative: 6 %
HCT: 40 % (ref 39.0–52.0)
Hemoglobin: 13.2 g/dL (ref 13.0–17.0)
Lymphocytes Relative: 17 %
Lymphs Abs: 1.8 10*3/uL (ref 0.7–4.0)
MCH: 28.4 pg (ref 26.0–34.0)
MCHC: 33 g/dL (ref 30.0–36.0)
MCV: 86 fL (ref 78.0–100.0)
Monocytes Absolute: 0.9 10*3/uL (ref 0.1–1.0)
Monocytes Relative: 9 %
Neutro Abs: 7.3 10*3/uL (ref 1.7–7.7)
Neutrophils Relative %: 68 %
Platelets: 211 10*3/uL (ref 150–400)
RBC: 4.65 MIL/uL (ref 4.22–5.81)
RDW: 13.6 % (ref 11.5–15.5)
WBC: 10.6 10*3/uL — ABNORMAL HIGH (ref 4.0–10.5)

## 2016-10-18 LAB — T4, FREE: T4,Free(Direct): 1.27 ng/dL (ref 0.82–1.77)

## 2016-10-18 LAB — T3 UPTAKE
Free Thyroxine Index: 2.2 (ref 1.2–4.9)
T3 Uptake Ratio: 25 % (ref 24–39)

## 2016-10-18 LAB — T4: Thyroxine (T4): 8.8 ug/dL (ref 4.5–12.0)

## 2016-10-18 LAB — I-STAT CG4 LACTIC ACID, ED: Lactic Acid, Venous: 1.54 mmol/L (ref 0.5–1.9)

## 2016-10-18 MED ORDER — DOCUSATE SODIUM 100 MG PO CAPS
200.0000 mg | ORAL_CAPSULE | Freq: Two times a day (BID) | ORAL | Status: DC
  Administered 2016-10-18 – 2016-10-20 (×4): 200 mg via ORAL
  Filled 2016-10-18 (×4): qty 2

## 2016-10-18 MED ORDER — SODIUM CHLORIDE 0.9 % IV SOLN
8.0000 mg | Freq: Three times a day (TID) | INTRAVENOUS | Status: DC | PRN
  Filled 2016-10-18: qty 4

## 2016-10-18 MED ORDER — LIDOCAINE-PRILOCAINE 2.5-2.5 % EX CREA: 1.0000 "application " | TOPICAL_CREAM | Freq: Two times a day (BID) | CUTANEOUS | Status: DC | PRN

## 2016-10-18 MED ORDER — LIDOCAINE-PRILOCAINE 2.5-2.5 % EX CREA
TOPICAL_CREAM | Freq: Once | CUTANEOUS | Status: AC
  Administered 2016-10-18: 1 via TOPICAL
  Filled 2016-10-18: qty 5

## 2016-10-18 MED ORDER — OXYCODONE HCL 5 MG PO TABS
5.0000 mg | ORAL_TABLET | ORAL | Status: AC
  Administered 2016-10-18: 5 mg via ORAL
  Filled 2016-10-18: qty 1

## 2016-10-18 MED ORDER — SODIUM CHLORIDE 0.9% FLUSH
3.0000 mL | Freq: Two times a day (BID) | INTRAVENOUS | Status: DC
  Administered 2016-10-19 – 2016-10-20 (×2): 3 mL via INTRAVENOUS

## 2016-10-18 MED ORDER — ONDANSETRON HCL 4 MG PO TABS: 8.0000 mg | ORAL_TABLET | Freq: Three times a day (TID) | ORAL | Status: DC | PRN

## 2016-10-18 MED ORDER — IBUPROFEN 200 MG PO TABS: 200.0000 mg | ORAL_TABLET | Freq: Three times a day (TID) | ORAL | Status: DC | PRN

## 2016-10-18 MED ORDER — OXYCODONE HCL ER 10 MG PO T12A
20.0000 mg | EXTENDED_RELEASE_TABLET | Freq: Two times a day (BID) | ORAL | Status: DC
  Administered 2016-10-18 – 2016-10-20 (×4): 20 mg via ORAL
  Filled 2016-10-18 (×4): qty 2

## 2016-10-18 MED ORDER — HEPARIN SOD (PORK) LOCK FLUSH 100 UNIT/ML IV SOLN: 500.0000 [IU] | INTRAVENOUS | Status: DC | PRN

## 2016-10-18 MED ORDER — POLYETHYLENE GLYCOL 3350 17 G PO PACK: 17.0000 g | PACK | Freq: Every day | ORAL | Status: DC | PRN

## 2016-10-18 MED ORDER — POLYETHYLENE GLYCOL 3350 17 G PO PACK
17.0000 g | PACK | Freq: Every day | ORAL | Status: DC
  Administered 2016-10-19 – 2016-10-20 (×2): 17 g via ORAL
  Filled 2016-10-18 (×2): qty 1

## 2016-10-18 MED ORDER — LORAZEPAM 0.5 MG PO TABS
0.5000 mg | ORAL_TABLET | Freq: Four times a day (QID) | ORAL | Status: DC | PRN
  Administered 2016-10-19: 0.5 mg via ORAL
  Filled 2016-10-18: qty 1

## 2016-10-18 MED ORDER — LOSARTAN POTASSIUM 50 MG PO TABS
100.0000 mg | ORAL_TABLET | Freq: Every day | ORAL | Status: DC
  Administered 2016-10-19 – 2016-10-20 (×2): 100 mg via ORAL
  Filled 2016-10-18 (×2): qty 2

## 2016-10-18 MED ORDER — ACETAMINOPHEN 325 MG PO TABS: 650.0000 mg | ORAL_TABLET | Freq: Three times a day (TID) | ORAL | Status: DC | PRN

## 2016-10-18 MED ORDER — OXYCODONE HCL 5 MG PO TABS
5.0000 mg | ORAL_TABLET | Freq: Four times a day (QID) | ORAL | Status: DC | PRN
  Administered 2016-10-19 – 2016-10-20 (×4): 5 mg via ORAL
  Filled 2016-10-18 (×4): qty 1

## 2016-10-18 MED ORDER — LORAZEPAM 2 MG/ML IJ SOLN
1.0000 mg | Freq: Once | INTRAMUSCULAR | Status: AC
  Administered 2016-10-19: 1 mg via INTRAVENOUS
  Filled 2016-10-18: qty 1

## 2016-10-18 MED ORDER — LOSARTAN POTASSIUM-HCTZ 100-12.5 MG PO TABS: 1.0000 | ORAL_TABLET | Freq: Every day | ORAL | Status: DC

## 2016-10-18 MED ORDER — SODIUM CHLORIDE 0.9% FLUSH
3.0000 mL | INTRAVENOUS | Status: DC | PRN
Start: 2016-10-18 — End: 2016-10-20

## 2016-10-18 MED ORDER — HYDROCHLOROTHIAZIDE 12.5 MG PO CAPS
12.5000 mg | ORAL_CAPSULE | Freq: Every day | ORAL | Status: DC
  Administered 2016-10-19 – 2016-10-20 (×2): 12.5 mg via ORAL
  Filled 2016-10-18 (×3): qty 1

## 2016-10-18 MED ORDER — DEXAMETHASONE SODIUM PHOSPHATE 4 MG/ML IJ SOLN
4.0000 mg | Freq: Four times a day (QID) | INTRAMUSCULAR | Status: DC
  Administered 2016-10-19: 4 mg via INTRAVENOUS
  Filled 2016-10-18 (×2): qty 1

## 2016-10-18 MED ORDER — ENOXAPARIN SODIUM 40 MG/0.4ML ~~LOC~~ SOLN
40.0000 mg | SUBCUTANEOUS | Status: DC
  Administered 2016-10-18 – 2016-10-19 (×2): 40 mg via SUBCUTANEOUS
  Filled 2016-10-18 (×2): qty 0.4

## 2016-10-18 MED ORDER — DEXAMETHASONE SODIUM PHOSPHATE 10 MG/ML IJ SOLN
10.0000 mg | Freq: Once | INTRAMUSCULAR | Status: AC
  Administered 2016-10-18: 10 mg via INTRAVENOUS
  Filled 2016-10-18: qty 1

## 2016-10-18 MED ORDER — TESTOSTERONE 50 MG/5GM (1%) TD GEL
5.0000 g | Freq: Every day | TRANSDERMAL | Status: DC
  Administered 2016-10-19: 5 g via TRANSDERMAL
  Filled 2016-10-18: qty 5

## 2016-10-18 MED ORDER — SODIUM CHLORIDE 0.9 % IV SOLN: 250.0000 mL | INTRAVENOUS | Status: DC | PRN

## 2016-10-18 NOTE — Progress Notes (Signed)
Patient arrived room safely. Full assessment and Neuro check done. Pt is unable to go for MRI without Ativan

## 2016-10-18 NOTE — ED Notes (Signed)
Rt chest port accessed

## 2016-10-18 NOTE — ED Notes (Signed)
Bed: WA07 Expected date:  Expected time:  Means of arrival:  Comments: 

## 2016-10-18 NOTE — ED Notes (Signed)
Pt states that he had a fall today at home and hit the left  Ear. Pt states that his legs seemed to have given out on him. Pt is alert and oriented x 4 and is verbally responsive. Pt wife  is at bedside. Pt denies any LOC.

## 2016-10-18 NOTE — ED Triage Notes (Signed)
Per pt, states he was looking out window when his knees got weak-fell on left side-no LOC, did not hit head-on oral chemo-patient states he came for safety measures, no acute complaints

## 2016-10-18 NOTE — ED Provider Notes (Signed)
[facility] DEPT Provider Note   CSN: [id number] Arrival date & time: 10/18/16  1032     History   Chief Complaint Chief Complaint  Patient presents with  . Fall    HPI Phillip [name] is a 71 y.o. male.  HPI   71 yo M with PMHx as below including known metastatic renal CA here with weakness and falls. Pt reportedly has become increasingly weak x 1-2 weeks. He has been feeling weak in his shoulders and hips and is having difficulty walking and getting up/pulling himself up. He is normally very strong. He has had associated multiple falls, most recently today when he lost his footing and fell backward. He struck his left ear but no direct head injury or LOC. He has mild back pain that has been constant x weeks, no new changes. No loss of bowel or bladder function. He reports he feels generally weak and tired. No fevers. No abdominal pain ,nausea, vomiting. No dysuria or hematuria.  Past Medical History:  Diagnosis Date  . Cancer ([place])   . Hypertension     Patient Active Problem List   Diagnosis Date Noted  . Spinal cord compression ([place]) 10/18/2016  . HTN (hypertension) 10/18/2016  . Pain management 10/18/2016  . Port catheter in place 07/06/2016  . Goals of care, counseling/discussion 04/04/2016  . Renal cell carcinoma ([place]) 11/22/2015  . 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 IR GENERIC HISTORICAL  11/18/2015   IR FLUORO GUIDED NEEDLE PLC ASPIRATION/INJECTION LOC 11/18/2015 [name], MD [facility]-INTERV RAD  . IR GENERIC HISTORICAL  05/16/2016   IR FLUORO GUIDE PORT INSERTION RIGHT 05/16/2016 [name], MD [facility]-INTERV RAD  . IR GENERIC HISTORICAL  05/16/2016   IR US GUIDE VASC ACCESS RIGHT 05/16/2016 [name], MD [facility]-INTERV RAD       Home Medications    Prior to Admission medications   Medication Sig Start Date End Date Taking? Authorizing Provider  acetaminophen (TYLENOL) 650 MG CR tablet Take 650  mg by mouth every 8 (eight) hours as needed for pain.   Yes [provider]  docusate sodium (COLACE) 100 MG capsule Take 200 mg by mouth 2 (two) times daily.    Yes [provider]  hydrocortisone (CORTEF) 10 MG tablet Take 2 tablets (20 mg) QAM and 1 tablet (10 mg) QPM. Patient taking differently: Take 10-20 mg by mouth 2 (two) times daily. Take 2 tablets (20 mg) QAM and 1 tablet (10 mg) QPM. 10/03/16  Yes [name], MD  ibuprofen (ADVIL,MOTRIN) 200 MG tablet Take 200 mg by mouth every 8 (eight) hours as needed for fever, headache, mild pain, moderate pain or cramping.    Yes [provider]  lidocaine-prilocaine (EMLA) cream Apply 1 application topically as needed. Apply to portacath 1 hour prior to use 05/24/16  Yes Owens Shark, NP  LORazepam (ATIVAN) 0.5 MG tablet 1 tablet po 30 minutes prior to radiation or MRI Patient taking differently: Take 0.5 mg by mouth as needed for anxiety. 1 tablet po 30 minutes prior to radiation or MRI 11/22/15  Yes Hayden Pedro, PA-C  losartan-hydrochlorothiazide (HYZAAR) 100-12.5 MG tablet Take 1 tablet by mouth daily.  09/23/15  Yes [provider]  ondansetron (ZOFRAN) 8 MG tablet Take 1 tablet (8 mg total) by mouth every 8 (eight) hours as needed for nausea or vomiting. Patient taking differently: Take 8 mg by mouth daily.  10/15/16  Yes Betsy Coder  B, MD  oxyCODONE (OXY IR/ROXICODONE) 5 MG immediate release tablet Take 5 mg by mouth every 6 (six) hours as needed for pain. 09/21/16  Yes [provider]  oxyCODONE (OXYCONTIN) 20 mg 12 hr tablet Take 20 mg by mouth every 12 (twelve) hours.   Yes [provider]  polyethylene glycol (MIRALAX / GLYCOLAX) packet Take 17 g by mouth daily.   Yes [provider]  testosterone (ANDROGEL) 50 MG/5GM (1%) GEL 50 mg applied once daily in the morning to the shoulder and upper arms, or abdomen Patient taking differently: Place 5 g onto the skin daily  at 6 PM. 50 mg applied once daily in the evening to the shoulder and upper arms, or abdomen 09/10/16  Yes Owens Shark, NP    Family History Family History  Problem Relation Age of Onset  . Cancer Grandchild        lymphoma    Social History Social History  Substance Use Topics  . Smoking status: Never Smoker  . Smokeless tobacco: Never Used  . Alcohol use 0.6 oz/week    1 Glasses of wine per week     Allergies   Patient has no known allergies.   Review of Systems Review of Systems  Constitutional: Positive for fatigue. Negative for chills and fever.  HENT: Negative for congestion and rhinorrhea.   Eyes: Negative for visual disturbance.  Respiratory: Negative for cough, shortness of breath and wheezing.   Cardiovascular: Negative for chest pain and leg swelling.  Gastrointestinal: Negative for abdominal pain, diarrhea, nausea and vomiting.  Genitourinary: Negative for dysuria and flank pain.  Musculoskeletal: Positive for gait problem. Negative for neck pain and neck stiffness.  Skin: Negative for rash and wound.  Allergic/Immunologic: Negative for immunocompromised state.  Neurological: Positive for weakness. Negative for syncope and headaches.  All other systems reviewed and are negative.    Physical Exam Updated Vital Signs BP (!) 156/67 (BP Location: Left Arm)   Pulse 80   Temp 99 F (37.2 C) (Oral)   Resp 18   SpO2 99%   Physical Exam  Constitutional: He is oriented to person, place, and time. He appears well-developed and well-nourished. No distress.  HENT:  Head: Normocephalic and atraumatic.  Eyes: Conjunctivae are normal.  Neck: Neck supple.  Cardiovascular: Normal rate, regular rhythm and normal heart sounds.  Exam reveals no friction rub.   No murmur heard. Pulmonary/Chest: Effort normal and breath sounds normal. No respiratory distress. He has no wheezes. He has no rales.  Abdominal: Soft. Bowel sounds are normal. He exhibits no distension.  There is no tenderness.  Musculoskeletal: He exhibits tenderness (mild diffuse TTP over lower lumbar spine). He exhibits no edema.  Neurological: He is alert and oriented to person, place, and time. He exhibits normal muscle tone.  5/5 strength throughout UE bilaterally. 4/5 strength with hip flexion/extension in LE, 5/5 at knees and distally. Normal sensation to light touch. Face symmetric, no CN deficits. Gait deferred due to proximal leg weakness.  Skin: Skin is warm. Capillary refill takes less than 2 seconds.  Psychiatric: He has a normal mood and affect.  Nursing note and vitals reviewed.    ED Treatments / Results  Labs (all labs ordered are listed, but only abnormal results are displayed) Labs Reviewed  CBC WITH DIFFERENTIAL/PLATELET - Abnormal; Notable for the following:       Result Value   WBC 10.6 (*)    All other components within normal limits  COMPREHENSIVE METABOLIC  PANEL - Abnormal; Notable for the following:    Glucose, Bld 130 (*)    All other components within normal limits  CK  CORTISOL  URINALYSIS, ROUTINE W REFLEX MICROSCOPIC  CBC  CREATININE, SERUM  BASIC METABOLIC PANEL  I-STAT CG4 LACTIC ACID, ED    EKG  EKG Interpretation None       Radiology Dg Lumbar Spine Complete  Result Date: 10/18/2016 CLINICAL DATA:  Renal cell cancer.  Fall.  Weakness.  Pain . EXAM: LUMBAR SPINE - COMPLETE 4+ VIEW COMPARISON:  CT 09/03/2016. FINDINGS: Lumbar vertebra numbered as per prior exam. Diffuse multilevel degenerative change. L4 lesion is stable in appearance. Mild stable lumbar compressions. No acute compression fracture. Aortic atherosclerotic vascular calcification . IMPRESSION: Stable exam. L4 lesion is stable. Stable mild compressions and degenerative change. No acute compression fracture noted. Electronically Signed   By: Marcello Moores  Register   On: 10/18/2016 12:08    Procedures Procedures (including critical care time)  Medications Ordered in ED Medications    acetaminophen (TYLENOL) tablet 650 mg (not administered)  oxyCODONE (Oxy IR/ROXICODONE) immediate release tablet 5 mg (not administered)  oxyCODONE (OXYCONTIN) 12 hr tablet 20 mg (not administered)  testosterone (ANDROGEL) 50 MG/5GM (1%) gel 5 g (not administered)  ibuprofen (ADVIL,MOTRIN) tablet 200 mg (not administered)  lidocaine-prilocaine (EMLA) cream 1 application (not administered)  docusate sodium (COLACE) capsule 200 mg (not administered)  polyethylene glycol (MIRALAX / GLYCOLAX) packet 17 g (not administered)  LORazepam (ATIVAN) tablet 0.5 mg (not administered)  enoxaparin (LOVENOX) injection 40 mg (not administered)  sodium chloride flush (NS) 0.9 % injection 3 mL (not administered)  sodium chloride flush (NS) 0.9 % injection 3 mL (not administered)  0.9 %  sodium chloride infusion (not administered)  polyethylene glycol (MIRALAX / GLYCOLAX) packet 17 g (not administered)  ondansetron (ZOFRAN) tablet 8 mg (not administered)    Or  ondansetron (ZOFRAN) 8 mg in sodium chloride 0.9 % 50 mL IVPB (not administered)  dexamethasone (DECADRON) injection 4 mg (not administered)  losartan (COZAAR) tablet 100 mg (not administered)    And  hydrochlorothiazide (MICROZIDE) capsule 12.5 mg (not administered)  lidocaine-prilocaine (EMLA) cream (1 application Topical Given 10/18/16 1230)  dexamethasone (DECADRON) injection 10 mg (10 mg Intravenous Given 10/18/16 1424)  oxyCODONE (Oxy IR/ROXICODONE) immediate release tablet 5 mg (5 mg Oral Given 10/18/16 1550)     Initial Impression / Assessment and Plan / ED Course  I have reviewed the triage vital signs and the nursing notes.  Pertinent labs & imaging results that were available during my care of the patient were reviewed by me and considered in my medical decision making (see chart for details).     71 yo M here with proximal muscle weakness leading to recurrent falls, in setting of known renal cell CA. DDx includes worsening spinal  mets, paraneoplastic syndrome, steroid-induced or chemotherapy-related myopathy. Labs are o/w overall reassuring. WBC normal, no signs of infectious trigger. UA is without UTI. Discussed with Dr. Mickey Farber. Will admit for MRI, IV Decadron, and further work-up.  Final Clinical Impressions(s) / ED Diagnoses   Final diagnoses:  Recurrent falls  Proximal leg weakness    New Prescriptions Current Discharge Medication List       Duffy Bruce, MD 10/18/16 1911

## 2016-10-18 NOTE — H&P (Signed)
History and Physical:    Phillip [name]   IEP:[id number] DOB: 23-Apr-1945 DOA: 10/18/2016  Referring MD/provider: Dr. Ellender Hose PCP: [name], MD   Patient coming from: Home  Chief Complaint: "my knees keep buckling and I felt this morning".  History of Present Illness:   Phillip [name] is an 71 y.o. 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 Patient notes that he's been having "knee buckling" every so often since then. Patient notes that he was in his usual state of health until last night when he had a harder time than usual walking to the toilet after he rolled out of bed due to "knee buckling". This morning patient had difficulty getting up off the commode however was able to do it using his upper extremities. Shortly thereafter patient was walking in his house when his "knees buckled completely" and he fell onto his back. He did hit his head against a plantar but notes "it was nothing". Patient had no loss of consciousness. Patient was able to get up from the floor using his upper extremities.   Patient specifically denies feeling dizzy or presyncopal during this event. Patient denies change in overall weakness. Patient denies fevers or chills, no nausea or vomiting, no headache or blurry vision. Patient is very clear that his "knees buckled".   Of note patient denies increase in back pain over the past month. Notes his pain is reasonably controlled on present regimen of OxyContin and when necessary oxycodone.  ED Course:  Patient was evaluated and found to have proximal muscle weakness. Dr. [name] was called who noted concern for spinal cord compression. He was treated with Decadron 10 mg IV and scheduled for L-spine MRI.  ROS:   ROS negative unless otherwise noted in the history of present  illness.  Past Medical History:   Past Medical History:  Diagnosis Date  . Cancer ([place])   . Hypertension     Past Surgical History:   Past Surgical History:  Procedure Laterality Date  . IR GENERIC HISTORICAL  11/18/2015   IR FLUORO GUIDED NEEDLE PLC ASPIRATION/INJECTION LOC 11/18/2015 [name], MD [facility]  . IR GENERIC HISTORICAL  05/16/2016   IR FLUORO GUIDE PORT INSERTION RIGHT 05/16/2016 [name], MD [facility]  . IR GENERIC HISTORICAL  05/16/2016   IR US GUIDE VASC ACCESS RIGHT 05/16/2016 [name], MD [facility]    Social History:   Social History   Social History  . Marital status: Single    Spouse name: N/A  . Number of children: N/A  . Years of education: N/A   Occupational History  . Not on file.   Social History Main Topics  . Smoking status: Never Smoker  . Smokeless tobacco: Never Used  . Alcohol use 0.6 oz/week    1 Glasses of wine per week  . Drug use: Yes    Types: Marijuana  . Sexual activity: Not Currently   Other Topics Concern  . Not on file   Social History Narrative  . No narrative on file    Allergies   Patient has no known allergies.  Family history:   Family History  Problem Relation Age of Onset  . Cancer Grandchild        lymphoma    Current Medications:   Prior to Admission medications   Medication  Sig Start Date End Date Taking? Authorizing Provider  acetaminophen (TYLENOL) 650 MG CR tablet Take 650 mg by mouth every 8 (eight) hours as needed for pain.   Yes [provider]  docusate sodium (COLACE) 100 MG capsule Take 200 mg by mouth 2 (two) times daily.    Yes [provider]  hydrocortisone (CORTEF) 10 MG tablet Take 2 tablets (20 mg) QAM and 1 tablet (10 mg) QPM. Patient taking differently: Take 10-20 mg by mouth 2 (two) times daily. Take 2 tablets (20 mg) QAM and 1 tablet (10 mg) QPM. 10/03/16  Yes [name], MD  ibuprofen (ADVIL,MOTRIN) 200 MG tablet Take 200  mg by mouth every 8 (eight) hours as needed for fever, headache, mild pain, moderate pain or cramping.    Yes [provider]  lidocaine-prilocaine (EMLA) cream Apply 1 application topically as needed. Apply to portacath 1 hour prior to use 05/24/16  Yes Owens Shark, NP  LORazepam (ATIVAN) 0.5 MG tablet 1 tablet po 30 minutes prior to radiation or MRI Patient taking differently: Take 0.5 mg by mouth as needed for anxiety. 1 tablet po 30 minutes prior to radiation or MRI 11/22/15  Yes Hayden Pedro, PA-C  losartan-hydrochlorothiazide (HYZAAR) 100-12.5 MG tablet Take 1 tablet by mouth daily.  09/23/15  Yes [provider]  ondansetron (ZOFRAN) 8 MG tablet Take 1 tablet (8 mg total) by mouth every 8 (eight) hours as needed for nausea or vomiting. Patient taking differently: Take 8 mg by mouth daily.  10/15/16  Yes [name], MD  oxyCODONE (OXY IR/ROXICODONE) 5 MG immediate release tablet Take 5 mg by mouth every 6 (six) hours as needed for pain. 09/21/16  Yes [provider]  oxyCODONE (OXYCONTIN) 20 mg 12 hr tablet Take 20 mg by mouth every 12 (twelve) hours.   Yes [provider]  polyethylene glycol (MIRALAX / GLYCOLAX) packet Take 17 g by mouth daily.   Yes [provider]  testosterone (ANDROGEL) 50 MG/5GM (1%) GEL 50 mg applied once daily in the morning to the shoulder and upper arms, or abdomen Patient taking differently: Place 5 g onto the skin daily at 6 PM. 50 mg applied once daily in the evening to the shoulder and upper arms, or abdomen 09/10/16  Yes Owens Shark, NP    Physical Exam:   Vitals:   10/18/16 1042 10/18/16 1216 10/18/16 1400 10/18/16 1401  BP: (!) 167/63 (!) 158/66 (!) 155/77 (!) 155/77  Pulse: (!) 56 80 79 76  Resp: 16   16  Temp: 98.2 F (36.8 C)     TempSrc: Oral     SpO2: 100% 98% 99% 98%     Physical Exam: Blood pressure (!) 155/77, pulse 76, temperature 98.2 F (36.8 C), temperature source Oral,  resp. rate 16, SpO2 98 %. Gen: Friendly, well-appearing gentleman sitting up in stretcher with wife at bedside. Eyes: Sclerae anicteric. Conjunctiva mildly injected. Neck: Supple, no jugular venous distention. Chest: Moderately good air entry bilaterally with no adventitious sounds.  CV: Distant, regular, no audible murmurs. Abdomen: NABS, soft, nondistended, nontender. No tenderness to light or deep palpation. No rebound, no guarding. Back: Patient with tenderness in L-spine which she states is not different from usual. Extremities: Patient with increased warmth and mild erythema in right knee compared to left knee. Right knee also has some bogginess and mild swelling compared to left knee. No tenderness to palpation in joint line or along ligaments in either knee.  Skin: No evidence of cellulitis on knee or lower extremities at all. Neuro: Alert and oriented times 3, patient able to flex at his knees bilaterally. He is able to minimally flex his hips. I'm unable to elicit any reflexes at knee jerk or ankle jerk. No clonus is elicited. No muscle atrophy is noted. Psych: Patient is cooperative, logical and coherent with appropriate mood and affect.  Data Review:    Labs: Basic Metabolic Panel:  Recent Labs Lab 10/17/16 1120 10/18/16 1237  NA 137 139  K 4.2 4.2  CL  --  103  CO2 28 27  GLUCOSE 129 130*  BUN 16.7 16  CREATININE 0.8 0.66  CALCIUM 10.6* 9.8   Liver Function Tests:  Recent Labs Lab 10/17/16 1120 10/18/16 1237  AST 12 16  ALT 14 17  ALKPHOS 70 57  BILITOT 0.50 0.5  PROT 7.5 6.8  ALBUMIN 4.0 4.0   No results for input(s): LIPASE, AMYLASE in the last 168 hours. No results for input(s): AMMONIA in the last 168 hours. CBC:  Recent Labs Lab 10/17/16 1120 10/18/16 1237  WBC 10.6* 10.6*  NEUTROABS 7.2* 7.3  HGB 13.8 13.2  HCT 42.3 40.0  MCV 87.8 86.0  PLT 216 211   Cardiac Enzymes:  Recent Labs Lab 10/18/16 1237  CKTOTAL 65    BNP (last 3  results) No results for input(s): PROBNP in the last 8760 hours. CBG: No results for input(s): GLUCAP in the last 168 hours.  Urinalysis    Component Value Date/Time   COLORURINE YELLOW 10/18/2016 [place] 10/18/2016 1348   LABSPEC 1.021 10/18/2016 1348   LABSPEC 1.010 01/17/2016 1129   PHURINE 6.0 10/18/2016 1348   GLUCOSEU NEGATIVE 10/18/2016 1348   GLUCOSEU 100 01/17/2016 1129   HGBUR NEGATIVE 10/18/2016 [place] 10/18/2016 1348   BILIRUBINUR Negative 01/17/2016 [place] 10/18/2016 1348   PROTEINUR NEGATIVE 10/18/2016 1348   UROBILINOGEN 0.2 01/17/2016 1129   NITRITE NEGATIVE 10/18/2016 1348   LEUKOCYTESUR NEGATIVE 10/18/2016 1348   LEUKOCYTESUR Negative 01/17/2016 1129      Radiographic Studies: Dg Lumbar Spine Complete  Result Date: 10/18/2016 CLINICAL DATA:  Renal cell cancer.  Fall.  Weakness.  Pain . EXAM: LUMBAR SPINE - COMPLETE 4+ VIEW COMPARISON:  CT 09/03/2016. FINDINGS: Lumbar vertebra numbered as per prior exam. Diffuse multilevel degenerative change. L4 lesion is stable in appearance. Mild stable lumbar compressions. No acute compression fracture. Aortic atherosclerotic vascular calcification . IMPRESSION: Stable exam. L4 lesion is stable. Stable mild compressions and degenerative change. No acute compression fracture noted. Electronically Signed   By: Marcello Moores  Register   On: 10/18/2016 12:08    EKG: Ordered   Assessment/Plan:   Principal Problem:   Spinal cord compression ([place]) Active Problems:   Lumbar spine metastasis   Renal cell carcinoma ([place])  WEAKNESS and FALL Patient notes his knees have been buckling 4-6 weeks however with increased frequency over the past 24 hours. Clearly in a patient with known lumbar spine metastases spinal cord compression must be on the top of the differential. Physical exam was not very helpful as I am unable to elicit any reflexes at all. No clonus. Decadron 10 mg  1 was given in ED MRI ordered Continue Decadron 4 mg every 6 hours until MRI has been read.  Differential diagnosis ALSO includes steroid myopathy although CPK is within normal limits. It is also possible that he has quadriceps weakness from debility and decreased use.  RIGHT KNEE Right knee with increased warmth and redness compared to left knee.  Patient states that he has noticed for a couple of weeks that his right knee has stayed warmer than his left knee. No increase in tenderness with palpation of joint line or ligaments in either knee. I wonder if patient has gout that has been treated with his hydrocortisone that he has been taking for his adrenal insufficiency. If MRI is negative, would aspirate knee for diagnosis.   PAIN MANAGEMENT Continue OxyContin and oxycodone per home regimen.  HTN Continue losartan/hydrochlorothiazide.  [place] Per Dr. [name], not actively being managed during this hospital stay.   Other information:   DVT prophylaxis: Lovenox ordered. Code Status: Full code. Family Communication: Patient's wife was at bedside throughout the encounter. She is former Cytogeneticist at [employer].   Disposition Plan: Home Consults called: Dr. [name] Admission status: Inpatient   The medical decision making on this patient was of high complexity and the patient is at high risk for clinical deterioration, therefore this is a level 3 visit.    Dewaine Oats Derek Jack [facility] Pager [phone] Cell: [phone]   If 7PM-7AM, please contact night-coverage [email or web address] Password [facility] 10/18/2016, 2:56 PM

## 2016-10-19 ENCOUNTER — Ambulatory Visit
Admission: RE | Admit: 2016-10-19 | Discharge: 2016-10-19 | Disposition: A | Discharge status: Home / Self Care | Payer: PPO | Source: Ambulatory Visit | Attending: Radiation Oncology | Admitting: Radiation Oncology

## 2016-10-19 ENCOUNTER — Ambulatory Visit
Admission: RE | Admit: 2016-10-19 | Discharge: 2016-10-19 | Disposition: A | Discharge status: Home / Self Care | Payer: PPO | Source: Ambulatory Visit | Admitting: Radiation Oncology

## 2016-10-19 ENCOUNTER — Inpatient Hospital Stay (HOSPITAL_COMMUNITY): Payer: PPO

## 2016-10-19 DIAGNOSIS — R296 Repeated falls: Secondary | ICD-10-CM

## 2016-10-19 DIAGNOSIS — C7949 Secondary malignant neoplasm of other parts of nervous system: Secondary | ICD-10-CM

## 2016-10-19 DIAGNOSIS — R29898 Other symptoms and signs involving the musculoskeletal system: Secondary | ICD-10-CM

## 2016-10-19 LAB — LACTATE DEHYDROGENASE: LDH: 120 U/L (ref 98–192)

## 2016-10-19 MED ORDER — LORAZEPAM 2 MG/ML IJ SOLN: 1.0000 mg | Freq: Once | INTRAMUSCULAR | Status: DC | PRN

## 2016-10-19 MED ORDER — GADOBENATE DIMEGLUMINE 529 MG/ML IV SOLN
20.0000 mL | Freq: Once | INTRAVENOUS | Status: AC | PRN
  Administered 2016-10-19: 20 mL via INTRAVENOUS

## 2016-10-19 MED ORDER — LORAZEPAM 1 MG PO TABS: 2.0000 mg | ORAL_TABLET | Freq: Once | ORAL | Status: DC | PRN

## 2016-10-19 MED ORDER — HYDROCORTISONE 10 MG PO TABS
10.0000 mg | ORAL_TABLET | Freq: Every day | ORAL | Status: DC
  Administered 2016-10-19: 10 mg via ORAL
  Filled 2016-10-19 (×2): qty 1

## 2016-10-19 MED ORDER — HYDROCORTISONE 20 MG PO TABS
20.0000 mg | ORAL_TABLET | Freq: Every day | ORAL | Status: DC
  Administered 2016-10-20: 20 mg via ORAL
  Filled 2016-10-19: qty 1

## 2016-10-19 NOTE — Consult Note (Signed)
         [facility] CM Primary Care Navigator  10/19/2016  Phillip [name] 04/27/1945 [id number]   Met with patient, son, daughter in-law and girlfriend (Loreta Wise) at the bedside to identify possible discharge needs. Patientreports having recurrent falls due to "knees gave out" or "knee buckled"that had led to this admission.   Patient endorses Dr. Robert Gates with [facility] [place] as his primary care provider.   [facility] [place].   Patient verbalized that girlfriendmanageshismedications at home with use of "pill box" filled every 3 weeks and he "lists down" what he takes.  Patient's girlfriend provides transportation to hisdoctors'appointments.   His girlfriend is the primary caregiver at home as stated.  Anticipated discharge plan is home with home health services (PT) per therapy recommendation.   Patient voiced understanding to call primary care provider's officewhen he gets homefor a post hospitalfollow-upwithin a week or sooner if needed.Patient letter (with PCP's contact number) was provided as a reminder.  Explained to patient and girlfriend about [facility] CM services available for healthmanagement at home but both denied any needs or concerns at this time. Patient states seeing his oncologist (Dr. Sherrill) every two weeks and health issues have been managed with girlfriend's assistance. Patient and girlfriend declined offer for EMMI Calls to follow-up with his recovery at home. Patienthad voiced understanding to seekreferral to [facility] care management servicesfrom primary care provider if deemed necessary and appropriatein thefuture.   [facility] care management information provided for future needs that may arise.   For questions, please contact:  Lorraine Ajel, BSN, RN- [facility] Primary Care Navigator  Telephone: [phone] [facility] 

## 2016-10-19 NOTE — Consult Note (Signed)
Radiation Oncology         [phone] ________________________________  Initial inpatient Consultation  Name: Phillip [name] MRN: [id number]  Date of Service: 10/18/2016 DOB: 12/20/1945  DJ:SHFWY, Herbie [place], MD  No ref. provider found   REFERRING PHYSICIAN: No ref. provider found  DIAGNOSIS: The primary encounter diagnosis was Recurrent falls. A diagnosis of Proximal leg weakness was also pertinent to this visit.    ICD-10-CM   1. Recurrent falls R29.6   2. Proximal leg weakness R29.898     HISTORY OF PRESENT ILLNESS: Phillip [name] is a 71 y.o. male seen at the request of Dr. [name].  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 He received SRS to L4 Right vertebral body to 50 Gy in 5 fractions at 10 Gy per fraction. He completed radiotherapy 12/12/15.  He appreciated significant pain relief following treatment and has shown stable disease in the spine on follow up MRIs since that time.  He has residual chronic low back pain which is managed by Dr. [name].  His systemic disease is managed by Dr. [name] and has recently completed 14 cycles of Nivolumab on 10/03/16.  Most recent CT C/A/P for disease staging demonstrated stable disease and the plan is to continue with Nivolumab, switching to monthly treatments.  He presented to the emergency room on 10/18/2016 with complaints of lower extremity weakness and multiple falls at home. He reports that the lower extremity weakness has been present and progressively worsening over the past 6-7 weeks. He has had at least 3 falls at home in the past 3 weeks, most recently yesterday just prior to his hospital admission. He reports no recent changes in his baseline back pain. He has occasional numbness and tingling in bilateral lower extremities from his knees to his feet which he feels is associated with his current  chemotherapy as this is much more prominent just following treatment. The numbness and tingling is not constant. He denies referred pain into his lower extremities. He reports the feeling that his legs "just give out" on him "without warning" when the falls occur.  The falls or not precipitated by pain or dizziness/imbalance. He denies loss of bowel or bladder function. He also denies recent fever, chills, night sweats, nausea, vomiting, headaches or changes in his visual or auditory acuity.  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 Fortunately, this did not show any disease progression and in fact showed stable treated L4 metastasis with chronic right L3 and L4 foraminal nerve root impingement and right L4 radiculitis like enhancement. 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   He does like the weakness has improved with the addition of IV Decadron. His pain is currently well controlled and he is comfortable. He is scheduled for an MRI of the brain later today which he is highly anxious about.  He has severe claustrophobia but did quite well with his recent lumbar MRI having been premedicated.  PREVIOUS RADIATION THERAPY: Yes 12/02/2015 to 12/12/2015:  The L4 Right spinal was treated to 50 Gy in 5 fractions at 10 Gy per fraction.   PAST MEDICAL HISTORY:  Past Medical History:  Diagnosis Date  . Cancer ([place])   . Hypertension  PAST SURGICAL HISTORY: Past Surgical History:  Procedure Laterality Date  . IR GENERIC HISTORICAL  11/18/2015   IR FLUORO GUIDED NEEDLE PLC ASPIRATION/INJECTION LOC 11/18/2015 [name], MD [facility]  . IR GENERIC HISTORICAL  05/16/2016   IR FLUORO GUIDE PORT INSERTION RIGHT 05/16/2016 [name], MD [facility]  . IR GENERIC HISTORICAL  05/16/2016   IR US GUIDE VASC ACCESS RIGHT 05/16/2016  [name], MD [facility]    FAMILY HISTORY:  Family History  Problem Relation Age of Onset  . Cancer Grandchild        lymphoma    SOCIAL HISTORY:  Social History   Social History  . Marital status: Single    Spouse name: N/A  . Number of children: N/A  . Years of education: N/A   Occupational History  . Not on file.   Social History Main Topics  . Smoking status: Never Smoker  . Smokeless tobacco: Never Used  . Alcohol use 0.6 oz/week    1 Glasses of wine per week  . Drug use: Yes    Types: Marijuana  . Sexual activity: Not Currently   Other Topics Concern  . Not on file   Social History Narrative  . No narrative on file    ALLERGIES: Patient has no known allergies.  MEDICATIONS:  Current Facility-Administered Medications  Medication Dose Route Frequency Provider Last Rate Last Dose  . 0.9 %  sodium chloride infusion  250 mL Intravenous PRN Bonnell Public Tublu, MD      . acetaminophen (TYLENOL) tablet 650 mg  650 mg Oral Q8H PRN Bonnell Public Tublu, MD      . docusate sodium (COLACE) capsule 200 mg  200 mg Oral BID Bonnell Public Tublu, MD   200 mg at 10/19/16 0849  . enoxaparin (LOVENOX) injection 40 mg  40 mg Subcutaneous Q24H Bonnell Public Tublu, MD   40 mg at 10/18/16 2149  . losartan (COZAAR) tablet 100 mg  100 mg Oral Daily Bonnell Public Tublu, MD   100 mg at 10/19/16 0849   And  . hydrochlorothiazide (MICROZIDE) capsule 12.5 mg  12.5 mg Oral Daily Bonnell Public Tublu, MD   12.5 mg at 10/19/16 0849  . hydrocortisone (CORTEF) tablet 10 mg  10 mg Oral QHS Ladene Artist., MD      . Derrill Memo ON 10/20/2016] hydrocortisone (CORTEF) tablet 20 mg  20 mg Oral Daily Ladene Artist., MD      . ibuprofen (ADVIL,MOTRIN) tablet 200 mg  200 mg Oral Q8H PRN Bonnell Public Tublu, MD      . lidocaine-prilocaine (EMLA) cream 1 application  1 application Topical BID PRN Bonnell Public Tublu, MD      . LORazepam  (ATIVAN) injection 1 mg  1 mg Intravenous Once PRN Ladene Artist., MD      . LORazepam (ATIVAN) tablet 0.5 mg  0.5 mg Oral Q6H PRN Bonnell Public Tublu, MD   0.5 mg at 10/19/16 1335  . ondansetron (ZOFRAN) tablet 8 mg  8 mg Oral Q8H PRN Bonnell Public Tublu, MD       Or  . ondansetron (ZOFRAN) 8 mg in sodium chloride 0.9 % 50 mL IVPB  8 mg Intravenous Q8H PRN Bonnell Public Tublu, MD      . oxyCODONE (Oxy IR/ROXICODONE) immediate release tablet 5 mg  5 mg Oral Q6H PRN Vashti Hey, MD   5 mg at 10/19/16 0449  . oxyCODONE (OXYCONTIN) 12 hr tablet 20 mg  20 mg Oral Q12H Bonnell Public Tublu, MD   20 mg at 10/19/16 0849  . polyethylene glycol (MIRALAX / GLYCOLAX) packet 17 g  17 g Oral Daily Bonnell Public Tublu, MD   17 g at 10/19/16 0850  . polyethylene glycol (MIRALAX / GLYCOLAX) packet 17 g  17 g Oral Daily PRN Bonnell Public Tublu, MD      . sodium chloride flush (NS) 0.9 % injection 3 mL  3 mL Intravenous Q12H Jamse Arn, Dewaine Oats Tublu, MD      . sodium chloride flush (NS) 0.9 % injection 3 mL  3 mL Intravenous PRN Bonnell Public Tublu, MD      . testosterone (ANDROGEL) 50 MG/5GM (1%) gel 5 g  5 g Transdermal q1800 Vashti Hey, MD       Facility-Administered Medications Ordered in Other Encounters  Medication Dose Route Frequency Provider Last Rate Last Dose  . sodium chloride flush (NS) 0.9 % injection 10 mL  10 mL Intravenous PRN [name], MD   10 mL at 06/22/16 3903    REVIEW OF SYSTEMS:  On review of systems, the patient reports that he is doing well overall. He denies any chest pain, shortness of breath, cough, fevers, chills, night sweats, unintended weight changes. He denies any bowel or bladder disturbances, and denies abdominal pain, nausea or vomiting. He denies any new musculoskeletal or joint aches or pains. He has chronic low back pain which has remained unchanged recently. He has noticed progressive weakness  in the lower extremities and increased fatigue in general. A complete review of systems is obtained and is otherwise negative aside from that in the history of present illness above.    PHYSICAL EXAM:  Wt Readings from Last 3 Encounters:  10/17/16 245 lb 11.2 oz (111.4 kg)  10/03/16 244 lb 6.4 oz (110.9 kg)  09/19/16 246 lb 9.6 oz (111.9 kg)   Temp Readings from Last 3 Encounters:  10/19/16 97.9 F (36.6 C) (Oral)  10/17/16 99 F (37.2 C) (Oral)  10/03/16 99 F (37.2 C) (Oral)   BP Readings from Last 3 Encounters:  10/19/16 (!) 154/63  10/17/16 131/65  10/03/16 134/62   Pulse Readings from Last 3 Encounters:  10/19/16 73  10/17/16 (!) 58  10/03/16 71   Pain Assessment Pain Score: 0-No pain/10  In general this is a well appearing Caucasian male in no acute distress. He is alert and oriented x4 and appropriate throughout the examination. HEENT reveals that the patient is normocephalic, atraumatic. EOMs are intact. PERRLA. Skin is intact without any evidence of gross lesions. Cardiovascular exam reveals a regular rate and rhythm, no clicks rubs or murmurs are auscultated. Chest is clear to auscultation bilaterally.  Abdomen has active bowel sounds in all quadrants and is intact. The abdomen is soft, non tender, non distended. Lower extremities are negative for pretibial pitting edema, deep calf tenderness, cyanosis or clubbing.  Strength is 4/5 and equal in the lower extremities.  Sensation is intact to light touch in bilateral lower extremities.  KPS = 60  100 - Normal; no complaints; no evidence of disease. 90   - Able to carry on normal activity; minor signs or symptoms of disease. 80   - Normal activity with effort; some signs or symptoms of disease. 45   - Cares for self; unable to carry on normal activity or to do active work. 60   - Requires occasional assistance, but is able to care for most of his personal needs. 50   -  Requires considerable assistance and frequent  medical care. 87   - Disabled; requires special care and assistance. 71   - Severely disabled; hospital admission is indicated although death not imminent. 59   - Very sick; hospital admission necessary; active supportive treatment necessary. 10   - Moribund; fatal processes progressing rapidly. 0     - Dead  Karnofsky DA, Abelmann Scissors, Craver LS and Burchenal [facility] [phone]) The use of the nitrogen mustards in the palliative treatment of carcinoma: with particular reference to bronchogenic carcinoma Cancer 1 634-56  LABORATORY DATA:  Lab Results  Component Value Date   WBC 9.5 10/18/2016   HGB 13.1 10/18/2016   HCT 40.0 10/18/2016   MCV 84.4 10/18/2016   PLT 207 10/18/2016   Lab Results  Component Value Date   NA 139 10/18/2016   K 4.2 10/18/2016   CL 103 10/18/2016   CO2 27 10/18/2016   Lab Results  Component Value Date   ALT 17 10/18/2016   AST 16 10/18/2016   ALKPHOS 57 10/18/2016   BILITOT 0.5 10/18/2016     RADIOGRAPHY: Dg Lumbar Spine Complete  Result Date: 10/18/2016 CLINICAL DATA:  Renal cell cancer.  Fall.  Weakness.  Pain . EXAM: LUMBAR SPINE - COMPLETE 4+ VIEW COMPARISON:  CT 09/03/2016. FINDINGS: Lumbar vertebra numbered as per prior exam. Diffuse multilevel degenerative change. L4 lesion is stable in appearance. Mild stable lumbar compressions. No acute compression fracture. Aortic atherosclerotic vascular calcification . IMPRESSION: Stable exam. L4 lesion is stable. Stable mild compressions and degenerative change. No acute compression fracture noted. Electronically Signed   By: Marcello Moores  Register   On: 10/18/2016 12:08   Mr Lumbar Spine W Wo Contrast  Result Date: 10/19/2016 CLINICAL DATA:  Metastatic renal cell. Progressive difficulty walking EXAM: MRI LUMBAR SPINE WITHOUT AND WITH CONTRAST TECHNIQUE: Multiplanar and multiecho pulse sequences of the lumbar spine were obtained without and with intravenous contrast. CONTRAST:  78mL MULTIHANCE GADOBENATE DIMEGLUMINE 529  MG/ML IV SOLN COMPARISON:  06/21/2016 FINDINGS: Segmentation:  Standard. Alignment:  Stable.  Slight anterolisthesis at L5-S1. Vertebrae: There is a known large treated metastasis within the L4 right posterior body, pedicle, transverse process, and articular processes. There was bulky extraosseous tumor at pretreatment scan. The lesion is chronically necrotic/cavitary after treatment, with similar changes seen in the neighboring psoas. The right psoas cavity has decreased in size and there is asymmetric atrophy of the right psoas. There is stable enhancement of intrinsic back muscles on the right more than left rib which is likely treatment related given the extraosseous extent tumor. Tumor expansion of bone and extraosseous disease causes chronic right L3 foraminal and L4 foraminal impingement. The congenitally narrow spinal canal is also moderately stenotic at the L3-4 and L4-5 levels, with right more than left subarticular recess impingement. 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 T11 also harbors a metastasis, as seen on more complete imaging of this level. Conus medullaris: Extends to the T12 level. There is chronic subtle enhancement of the right L4 nerve root without detectable thickening. No generalized or nodule intrathecal enhancement for intrathecal metastatic disease. Paraspinal and other soft tissues: Known retroperitoneal adenopathy, retrocaval, with staging abdominal CT most recently performed 09/03/2016. Chronic indistinct aortic wall thickening without visible wall enhancement, although there is saturation band. Disc levels: Generalized degenerative disc narrowing and mild bulging superimposed on a congenitally narrow spinal canal. Notable disc bulge and narrowing at L5-S1 with moderate right and mild left foraminal narrowing. Small central protrusion at this level which contacts but  does not compress the S1 nerve roots. IMPRESSION: 1. Stable treated L4 metastasis with chronic right L3 and L4 foraminal  nerve root impingement and right L4 radiculitis type enhancement. 2. 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 Electronically Signed   By: Monte Fantasia M.D.   On: 10/19/2016 06:24      IMPRESSION/PLAN: 1. 71 y.o.  with known Stage IV renal cell carcinoma with bony metastatic disease.   Fortunately, his recent MRI lumbar spine did not show any disease progression or spinal cord compression. Currently there does not appear to be a role for radiotherapy. His pain is well controlled at this time and his weakness seems to be improved with the addition of steroids. Once he is stable for discharge, at the discretion of the medicine team, we would recommend that he follow-up with his neurosurgeon, Dr. Vertell Limber. He is scheduled for an MRI of the brain later today. Pending no new findings indicating the need for radiotherapy, we will be on standby and are happy to participate in his care if clinically indicated.   Nicholos Johns, PA-C

## 2016-10-19 NOTE — Evaluation (Signed)
Physical Therapy Evaluation Patient Details Name: Phillip [name] MRN: [id number] DOB: 1945/10/27 Today's Date: 10/19/2016   History of Present Illness  Phillip [name] is an 71 y.o. 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 MRI: negative  Clinical Impression  Pt presented sitting OOB in recliner chair, awake and willing to participate in therapy session. Prior to admission, pt reported that he ambulates with use of RW or "walking stick" and is independent with ADLs. Pt lives in a ramped entrance house with his girlfriend. Pt stated that he was receiving PT in an outpatient setting but feels it might be best to have some HHPT upon d/c. Pt ambulated in hallway with use of RW and min guard for safety, no instability or LOB. Pt would continue to benefit from skilled physical therapy services at this time while admitted and after d/c to address the below listed limitations in order to improve overall safety and independence with functional mobility.      Follow Up Recommendations Home health PT    Equipment Recommendations  None recommended by PT    Recommendations for Other Services       Precautions / Restrictions Precautions Precautions: Fall Restrictions Weight Bearing Restrictions: No      Mobility  Bed Mobility Overal bed mobility: Modified Independent             General bed mobility comments: pt OOB in recliner chair upon arrival  Transfers Overall transfer level: Needs assistance Equipment used: Rolling walker (2 wheeled) Transfers: Sit to/from Stand Sit to Stand: Min guard         General transfer comment: increased time  Ambulation/Gait Ambulation/Gait assistance: Min guard Ambulation Distance (Feet): 200 Feet Assistive device: Rolling walker (2  wheeled) Gait Pattern/deviations: Step-through pattern;Decreased stride length Gait velocity: decreased Gait velocity interpretation: Below normal speed for age/gender General Gait Details: no instability or LOB, min guard for safety and as pt with hx of knees buckling  Stairs            Wheelchair Mobility    Modified Rankin (Stroke Patients Only)       Balance Overall balance assessment: Needs assistance;History of Falls Sitting-balance support: Feet supported Sitting balance-Leahy Scale: Good     Standing balance support: During functional activity;Bilateral upper extremity supported Standing balance-Leahy Scale: Poor Standing balance comment: reliant on bilateral UEs on RW                             Pertinent Vitals/Pain Pain Assessment: 0-10 Pain Score: 1  Pain Location: back Pain Descriptors / Indicators: Sore Pain Intervention(s): Monitored during session;Repositioned    Home Living Family/patient expects to be discharged to:: Private residence Living Arrangements: Spouse/significant other Available Help at Discharge: Family;Available 24 hours/day Type of Home: House Home Access: Ramped entrance     Home Layout: One level Home Equipment: Grab bars - toilet;Grab bars - tub/shower;Hand held shower head;Shower seat - built in;Walker - 2 wheels      Prior Function Level of Independence: Independent with assistive device(s)         Comments: pt reported that he usually ambulates with use of RW or "walking stick"     Hand Dominance   Dominant Hand: Right    Extremity/Trunk Assessment  Upper Extremity Assessment Upper Extremity Assessment: Defer to OT evaluation    Lower Extremity Assessment Lower Extremity Assessment: Generalized weakness       Communication   Communication: No difficulties  Cognition Arousal/Alertness: Awake/alert Behavior During Therapy: WFL for tasks assessed/performed Overall Cognitive Status: Within  Functional Limits for tasks assessed                                        General Comments      Exercises     Assessment/Plan    PT Assessment Patient needs continued PT services  PT Problem List Decreased strength;Decreased balance;Decreased mobility;Decreased coordination       PT Treatment Interventions DME instruction;Gait training;Stair training;Functional mobility training;Therapeutic activities;Therapeutic exercise;Balance training;Neuromuscular re-education;Patient/family education    PT Goals (Current goals can be found in the Care Plan section)  Acute Rehab PT Goals Patient Stated Goal: to regain confidence in himself when walking PT Goal Formulation: With patient Time For Goal Achievement: 11/02/16 Potential to Achieve Goals: Good    Frequency Min 3X/week   Barriers to discharge        Co-evaluation               AM-PAC PT "6 Clicks" Daily Activity  Outcome Measure Difficulty turning over in bed (including adjusting bedclothes, sheets and blankets)?: None Difficulty moving from lying on back to sitting on the side of the bed? : None Difficulty sitting down on and standing up from a chair with arms (e.g., wheelchair, bedside commode, etc,.)?: A Little Help needed moving to and from a bed to chair (including a wheelchair)?: A Little Help needed walking in hospital room?: A Little Help needed climbing 3-5 steps with a railing? : A Little 6 Click Score: 20    End of Session Equipment Utilized During Treatment: Gait belt Activity Tolerance: Patient tolerated treatment well Patient left: in chair;with call bell/phone within reach;with family/visitor present Nurse Communication: Mobility status PT Visit Diagnosis: Unsteadiness on feet (R26.81);Other abnormalities of gait and mobility (R26.89);History of falling (Z91.81)    Time: 9509-3267 PT Time Calculation (min) (ACUTE ONLY): 29 min   Charges:   PT Evaluation $PT Eval Moderate  Complexity: 1 Mod PT Treatments $Gait Training: 8-22 mins   PT G Codes:        [place], PT, [place] 10/19/2016, 4:16 PM

## 2016-10-19 NOTE — Progress Notes (Signed)
PROGRESS NOTE    Phillip [name]  QVZ:[id number] DOB: 1945-05-30 DOA: 10/18/2016 PCP: [name], MD (Confirm with patient/family/NH records and if not entered, this HAS to be entered at [facility] point of entry. "No PCP" if truly none.)   Brief Narrative: (Start on day 1 of progress note - keep it brief and live) Phillip [name] is an 71 y.o. 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  On the morning prior to admission he had more trouble getting off his commode.  Then when he is walking in his house he felt his knee buckle and he fell onto his back without loss of consciousness or head trauma. He did not have any lightheadedness or dizziness feel presyncopal. He had an MRI of his spine to rule out worsening lumbar metastasis. MRI spine showed stable metastasis.  Assessment & Plan:   Active Problems:   Lumbar spine metastasis   HTN (hypertension)   Pain management   Weakness and fall:  Patient has Rito Lecomte lower surety weakness, which seems to be most notably proximal and worse on his right side. He notes that he's had right-sided weakness for Zarriah Starkel while. Imaging did show chronic right L3 and L4 nerve root impingement and right L4 radiculitis type enhancement as well as moderate spinal stenosis. This could be related to his weakness, which is worse on the right. Will pursue MRI brain to rule out metastases. We'll also obtain nerve conduction studies as an outpatient.  Labs aren't suggestive of myositis with normal AST ALT, LDH and CK, but steroid myopathy possible.  Cortisol was 8.8.  Normal thyroid studies.   R knee: On my exam without any increased warmth or redness. Also no significant pain. We'll continue to monitor.  Oxycontin/oxycodone for chronic pain  Losartan/HCTZ for HTN  RCC:  Dr [name] following.  Discussed case with  him today.   DVT prophylaxis: lovenox Code Status: full  Family Communication: partner Disposition Plan: home   Consultants:   none  Procedures: (Don't include imaging studies which can be auto populated. Include things that cannot be auto populated i.e. Echo, Carotid and venous dopplers, Foley, Bipap, HD, tubes/drains, wound vac, central lines etc)  MRI pending  Antimicrobials: (specify start and planned stop date. Auto populated tables are space occupying and do not give end dates)  none    Subjective: Feeling ok.  No joint pain, fevers, chills.  No CP, SOB.  R leg has been bothering him since he had radiation.  No saddle anesthesia or bowel/bladder incontinence.   Objective: Vitals:   10/19/16 0114 10/19/16 0855 10/19/16 1442 10/19/16 1840  BP: (!) 150/63 (!) 154/63 (!) 150/68 (!) 132/57  Pulse: 88 73 76 82  Resp: 18 20 18 20   Temp: 98.7 F (37.1 C) 97.9 F (36.6 C) 98.5 F (36.9 C) 99 F (37.2 C)  TempSrc: Oral Oral Oral Oral  SpO2: 97% 98% 98% 96%    Intake/Output Summary (Last 24 hours) at 10/19/16 1920 Last data filed at 10/19/16 1218  Gross per 24 hour  Intake                0 ml  Output             1100 ml  Net            -1100 ml   There were  no vitals filed for this visit.  Examination:  General exam: Appears calm and comfortable  Respiratory system: Clear to auscultation. Respiratory effort normal. Cardiovascular system: S1 & S2 heard, RRR. No JVD, murmurs, rubs, gallops or clicks. No pedal edema. Gastrointestinal system: Abdomen is nondistended, soft and nontender. No organomegaly or masses felt. Normal bowel sounds heard. Central nervous system: Alert and oriented. No focal neurological deficits.  Sensation intact to light touch.  No saddle anesthesia.  Extremities: 4/5 strength to LE, greater weakness proximally.  No appreciable swelling to knee.  No tenderness to knee.  Symmetric warmth.  Skin: No rashes, lesions or ulcers Psychiatry: Judgement  and insight appear normal. Mood & affect appropriate.  Data Reviewed: I have personally reviewed following labs and imaging studies  CBC:  Recent Labs Lab 10/17/16 1120 10/18/16 1237 10/18/16 1531  WBC 10.6* 10.6* 9.5  NEUTROABS 7.2* 7.3  --   HGB 13.8 13.2 13.1  HCT 42.3 40.0 40.0  MCV 87.8 86.0 84.4  PLT 216 211 242   Basic Metabolic Panel:  Recent Labs Lab 10/17/16 1120 10/18/16 1237 10/18/16 1531  NA 137 139  --   K 4.2 4.2  --   CL  --  103  --   CO2 28 27  --   GLUCOSE 129 130*  --   BUN 16.7 16  --   CREATININE 0.8 0.66 0.76  CALCIUM 10.6* 9.8  --    GFR: Estimated Creatinine Clearance: 104.4 mL/min (by C-G formula based on SCr of 0.76 mg/dL). Liver Function Tests:  Recent Labs Lab 10/17/16 1120 10/18/16 1237  AST 12 16  ALT 14 17  ALKPHOS 70 57  BILITOT 0.50 0.5  PROT 7.5 6.8  ALBUMIN 4.0 4.0   No results for input(s): LIPASE, AMYLASE in the last 168 hours. No results for input(s): AMMONIA in the last 168 hours. Coagulation Profile: No results for input(s): INR, PROTIME in the last 168 hours. Cardiac Enzymes:  Recent Labs Lab 10/18/16 1237  CKTOTAL 65   BNP (last 3 results) No results for input(s): PROBNP in the last 8760 hours. HbA1C: No results for input(s): HGBA1C in the last 72 hours. CBG: No results for input(s): GLUCAP in the last 168 hours. Lipid Profile: No results for input(s): CHOL, HDL, LDLCALC, TRIG, CHOLHDL, LDLDIRECT in the last 72 hours. Thyroid Function Tests:  Recent Labs  10/17/16 1120  TSH 0.350  T4TOTAL 8.8  FREET4 1.27   Anemia Panel: No results for input(s): VITAMINB12, FOLATE, FERRITIN, TIBC, IRON, RETICCTPCT in the last 72 hours. Sepsis Labs:  Recent Labs Lab 10/18/16 1308  LATICACIDVEN 1.54    No results found for this or any previous visit (from the past 240 hour(s)).       Radiology Studies: Dg Lumbar Spine Complete  Result Date: 10/18/2016 CLINICAL DATA:  Renal cell cancer.  Fall.   Weakness.  Pain . EXAM: LUMBAR SPINE - COMPLETE 4+ VIEW COMPARISON:  CT 09/03/2016. FINDINGS: Lumbar vertebra numbered as per prior exam. Diffuse multilevel degenerative change. L4 lesion is stable in appearance. Mild stable lumbar compressions. No acute compression fracture. Aortic atherosclerotic vascular calcification . IMPRESSION: Stable exam. L4 lesion is stable. Stable mild compressions and degenerative change. No acute compression fracture noted. Electronically Signed   By: Marcello Moores  Register   On: 10/18/2016 12:08   Mr Lumbar Spine W Wo Contrast  Result Date: 10/19/2016 CLINICAL DATA:  Metastatic renal cell. Progressive difficulty walking EXAM: MRI LUMBAR SPINE WITHOUT AND WITH CONTRAST TECHNIQUE: Multiplanar and  multiecho pulse sequences of the lumbar spine were obtained without and with intravenous contrast. CONTRAST:  21mL MULTIHANCE GADOBENATE DIMEGLUMINE 529 MG/ML IV SOLN COMPARISON:  06/21/2016 FINDINGS: Segmentation:  Standard. Alignment:  Stable.  Slight anterolisthesis at L5-S1. Vertebrae: There is Chasyn Cinque known large treated metastasis within the L4 right posterior body, pedicle, transverse process, and articular processes. There was bulky extraosseous tumor at pretreatment scan. The lesion is chronically necrotic/cavitary after treatment, with similar changes seen in the neighboring psoas. The right psoas cavity has decreased in size and there is asymmetric atrophy of the right psoas. There is stable enhancement of intrinsic back muscles on the right more than left rib which is likely treatment related given the extraosseous extent tumor. Tumor expansion of bone and extraosseous disease causes chronic right L3 foraminal and L4 foraminal impingement. The congenitally narrow spinal canal is also moderately stenotic at the L3-4 and L4-5 levels, with right more than left subarticular recess impingement. 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 T11 also harbors Alvar Malinoski metastasis, as seen on more complete imaging of  this level. Conus medullaris: Extends to the T12 level. There is chronic subtle enhancement of the right L4 nerve root without detectable thickening. No generalized or nodule intrathecal enhancement for intrathecal metastatic disease. Paraspinal and other soft tissues: Known retroperitoneal adenopathy, retrocaval, with staging abdominal CT most recently performed 09/03/2016. Chronic indistinct aortic wall thickening without visible wall enhancement, although there is saturation band. Disc levels: Generalized degenerative disc narrowing and mild bulging superimposed on Markiesha Delia congenitally narrow spinal canal. Notable disc bulge and narrowing at L5-S1 with moderate right and mild left foraminal narrowing. Small central protrusion at this level which contacts but does not compress the S1 nerve roots. IMPRESSION: 1. Stable treated L4 metastasis with chronic right L3 and L4 foraminal nerve root impingement and right L4 radiculitis type enhancement. 2. 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 Electronically Signed   By: Monte Fantasia M.D.   On: 10/19/2016 06:24        Scheduled Meds: . docusate sodium  200 mg Oral BID  . enoxaparin (LOVENOX) injection  40 mg Subcutaneous Q24H  . losartan  100 mg Oral Daily   And  . hydrochlorothiazide  12.5 mg Oral Daily  . hydrocortisone  10 mg Oral QHS  . [START ON 10/20/2016] hydrocortisone  20 mg Oral Daily  . oxyCODONE  20 mg Oral Q12H  . polyethylene glycol  17 g Oral Daily  . sodium chloride flush  3 mL Intravenous Q12H  . testosterone  5 g Transdermal q1800   Continuous Infusions: . sodium chloride    . ondansetron (ZOFRAN) IV       LOS: 1 day    Time spent: 35 min    Helia Haese Melven Sartorius, MD [facility] [phone]  If 7PM-7AM, please contact night-coverage [email or web address] Password [email or web address] 10/19/2016, 7:20 PM

## 2016-10-19 NOTE — Evaluation (Signed)
Occupational Therapy Evaluation and Discharge Patient Details Name: Phillip [name] MRN: [id number] DOB: 1946-02-21 Today's Date: 10/19/2016    History of Present Illness Phillip [name] is an 71 y.o. 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 MRI: negative   Clinical Impression   This 71 yo male admitted with above presents to acute OT with all OT education completed with pt and his significant other. No further OT needs, we will sign off.    Follow Up Recommendations  No OT follow up;Supervision/Assistance - 24 hour    Equipment Recommendations  None recommended by OT       Precautions / Restrictions Precautions Precautions: Fall Restrictions Weight Bearing Restrictions: No      Mobility Bed Mobility Overal bed mobility: Modified Independent             General bed mobility comments: increased time  Transfers Overall transfer level: Needs assistance Equipment used: Rolling walker (2 wheeled) Transfers: Sit to/from Stand Sit to Stand: Min guard                  ADL either performed or assessed with clinical judgement   ADL Overall ADL's : Needs assistance/impaired Eating/Feeding: Independent;Sitting   Grooming: Set up;Sitting   Upper Body Bathing: Set up;Sitting   Lower Body Bathing: Minimal assistance Lower Body Bathing Details (indicate cue type and reason): min guard A sit<>stand Upper Body Dressing : Set up;Sitting     Lower Body Dressing Details (indicate cue type and reason): A for socks only (does not usually wear them); minguard A sit<>stand Toilet Transfer: Min guard;Ambulation;RW;BSC Toilet Transfer Details (indicate cue type and reason): We discussed an item call a toiletvator (that raises a toilet off of the ground) as oppossed to 3n1  that does not feel that well for him to sit on. Toileting- Water quality scientist and Hygiene: Min guard;Sit to/from stand     Tub/Shower Transfer Details (indicate cue type and reason): We talked about using 3n1 in shower stall as seat and he feels this will help him be safer         Vision Patient Visual Report: No change from baseline              Pertinent Vitals/Pain Pain Assessment: No/denies pain     Hand Dominance Right   Extremity/Trunk Assessment Upper Extremity Assessment Upper Extremity Assessment: Overall WFL for tasks assessed   Lower Extremity Assessment Lower Extremity Assessment: Defer to PT evaluation       Communication Communication Communication: No difficulties   Cognition Arousal/Alertness: Awake/alert Behavior During Therapy: WFL for tasks assessed/performed Overall Cognitive Status: Within Functional Limits for tasks assessed                                                Home Living Family/patient expects to be discharged to:: Private residence Living Arrangements: Spouse/significant other Available Help at Discharge: Family;Available 24 hours/day               Bathroom Shower/Tub: Occupational psychologist: Handicapped height     Home Equipment: Grab bars - toilet;Grab bars - tub/shower;Hand held shower head;Shower seat - built in;Walker -  2 wheels          Prior Functioning/Environment Level of Independence: Independent with assistive device(s) (RW)                 OT Problem List: Decreased strength         OT Goals(Current goals can be found in the care plan section) Acute Rehab OT Goals Patient Stated Goal: to get my strength and confidence back for being up on my feet  OT Frequency:                AM-PAC PT "6 Clicks" Daily Activity     Outcome Measure Help from another person eating meals?: None Help from another person taking care of personal grooming?: A Little Help from  another person toileting, which includes using toliet, bedpan, or urinal?: A Little Help from another person bathing (including washing, rinsing, drying)?: A Little Help from another person to put on and taking off regular upper body clothing?: A Little Help from another person to put on and taking off regular lower body clothing?: A Little 6 Click Score: 19   End of Session Equipment Utilized During Treatment: Gait belt;Rolling walker  Activity Tolerance: Patient tolerated treatment well Patient left: in chair;with call bell/phone within reach;with family/visitor present  OT Visit Diagnosis: Unsteadiness on feet (R26.81);Repeated falls (R29.6)                Time: 4481-8563 OT Time Calculation (min): 37 min Charges:  OT General Charges $OT Visit: 1 Procedure OT Evaluation $OT Eval Moderate Complexity: 1 Procedure OT Treatments $Self Care/Home Management : 8-22 mins Golden Circle, OTR/L [phone] 10/19/2016

## 2016-10-20 ENCOUNTER — Encounter (HOSPITAL_COMMUNITY): Payer: Self-pay | Admitting: General Practice

## 2016-10-20 MED ORDER — HEPARIN SOD (PORK) LOCK FLUSH 100 UNIT/ML IV SOLN
500.0000 [IU] | INTRAVENOUS | Status: DC
  Filled 2016-10-20: qty 5

## 2016-10-20 MED ORDER — HEPARIN SOD (PORK) LOCK FLUSH 100 UNIT/ML IV SOLN
500.0000 [IU] | INTRAVENOUS | Status: DC | PRN
  Administered 2016-10-20: 500 [IU]
  Filled 2016-10-20 (×2): qty 5

## 2016-10-20 MED ORDER — SODIUM CHLORIDE 0.9% FLUSH: 10.0000 mL | Freq: Two times a day (BID) | INTRAVENOUS | Status: DC

## 2016-10-20 MED ORDER — SODIUM CHLORIDE 0.9% FLUSH
10.0000 mL | INTRAVENOUS | Status: DC | PRN
  Administered 2016-10-20: 10 mL

## 2016-10-20 NOTE — Progress Notes (Signed)
Discharge orders received.  Discharge instructions and follow-up appointments reviewed with the patient and his significant other.  VSS upon discharge.  Port de-accessed by IV team and education complete.  Transported out via wheelchair.   Cori Razor, RN

## 2016-10-20 NOTE — Discharge Summary (Signed)
Physician Discharge Summary  Phillip [name] ZOX:[id number] DOB: 1946-01-17 DOA: 10/18/2016  PCP: [name], MD  Admit date: 10/18/2016 Discharge date: 10/20/2016  Time spent: 35 minutes  Recommendations for Outpatient Follow-up:  1. Consider MRI head to rule out mets  2. Follow up nerve conduction study 3. 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  On the morning prior to admission he had more trouble getting off his commode.  Then when he is walking in his house he felt his knee buckle and he fell onto his back without loss of consciousness or head trauma. He did not have any lightheadedness or dizziness feel presyncopal. He had an MRI of his spine to rule out worsening lumbar metastasis. MRI spine showed stable metastasis. We initially planned for head MRI to rule out mets, but this was delayed and Phillip [name] has claustrophobia with these exams and ultimately decided against getting the MRI (we discussed risks/benefits and he would prefer to consider this study outpatient).  Will plan for nerve conduction studies as outpatient.      Hospital Course:  Weakness and fall:  Patient has Phillip [name] lower extremity weakness, which seems to be most notably proximal and worse on his right side. He notes that he's had right-sided weakness for Phillip [name] while. Imaging did show chronic right L3 and L4 nerve root impingement and right  L4 radiculitis type enhancement as well as moderate spinal stenosis. This could be related to his weakness, which is worse on the right. MRI brain to r/o mets was deferred to outpatient setting (as noted above). We'll also obtain nerve conduction studies as an outpatient.  Labs aren't suggestive of myositis with normal AST ALT, LDH and CK, but steroid myopathy possible.  Cortisol was 8.8.  Normal thyroid studies.   R knee: On my exam without any increased warmth or redness. Also no significant pain. We'll continue to monitor.  Oxycontin/oxycodone for chronic pain  Losartan/HCTZ for HTN  RCC:  Dr [name] following.    Procedures:  none (i.e. Studies not automatically included, echos, thoracentesis, etc; not x-rays)  Consultations:  none  Discharge Exam: Vitals:   10/20/16 0054 10/20/16 0637  BP: (!) 124/59 (!) 148/76  Pulse: 74 75  Resp: 16 14  Temp: 98.3 F (36.8 C) 98.4 F (36.9 C)  SpO2: 99% 94%   Walked around hall with walker yesterday with PT.  No new weakness, numbness, tingling.  No Fevers, chills.  Weakness has been present for months, worse over past 3 weeks.  General: NAD Cardiovascular: RRR, no mgr Respiratory: CTAB S/NT/ND Ext: no LEE Neuro: 4/5 strength to LE (slightly weaker on R)  Discharge Instructions   Discharge Instructions    Call MD for:    Complete by:  As directed    Call MD for:  difficulty breathing, headache or visual disturbances    Complete by:  As directed    Call MD for:  persistant dizziness  or light-headedness    Complete by:  As directed    Call MD for:  persistant nausea and vomiting    Complete by:  As directed    Call MD for:  severe uncontrolled pain    Complete by:  As directed    Diet - low sodium heart healthy    Complete by:  As directed    Diet - low sodium heart healthy    Complete by:  As directed    Discharge instructions    Complete by:  As directed    You were seen in the hospital for lower extremity  weakness.  The lumbar MRI did not show any significant change.  We were planning on getting Phillip [name] head MRI to rule out metastasis, but with the delay, we'll plan for discharge.  Please return if you have any sudden worsening of symptoms or new symptoms.  I will order outpatient nerve conduction studies for you.  Please follow up with Dr. [name] within 1 week.   Increase activity slowly    Complete by:  As directed    Increase activity slowly    Complete by:  As directed      Current Discharge Medication List    CONTINUE these medications which have NOT CHANGED   Details  acetaminophen (TYLENOL) 650 MG CR tablet Take 650 mg by mouth every 8 (eight) hours as needed for pain.    docusate sodium (COLACE) 100 MG capsule Take 200 mg by mouth 2 (two) times daily.     hydrocortisone (CORTEF) 10 MG tablet Take 2 tablets (20 mg) QAM and 1 tablet (10 mg) QPM. Qty: 90 tablet, Refills: 0   Associated Diagnoses: Metastatic renal cell carcinoma, unspecified laterality ([name])    ibuprofen (ADVIL,MOTRIN) 200 MG tablet Take 200 mg by mouth every 8 (eight) hours as needed for fever, headache, mild pain, moderate pain or cramping.    Associated Diagnoses: Renal cell carcinoma, unspecified laterality ([name])    lidocaine-prilocaine (EMLA) cream Apply 1 application topically as needed. Apply to portacath 1 hour prior to use Qty: 30 g, Refills: 2   Associated Diagnoses: Renal cell carcinoma, unspecified laterality ([name])    LORazepam (ATIVAN) 0.5 MG tablet 1 tablet po 30 minutes prior to radiation or MRI Qty: 30 tablet, Refills: 0    losartan-hydrochlorothiazide (HYZAAR) 100-12.5 MG tablet Take 1 tablet by mouth daily.     ondansetron (ZOFRAN) 8 MG tablet Take 1 tablet (8 mg total) by mouth every 8 (eight) hours as needed for nausea or vomiting. Qty: 30 tablet, Refills: 1   Associated Diagnoses: Renal cell carcinoma, unspecified laterality ([name])    oxyCODONE (OXY IR/ROXICODONE) 5 MG immediate release tablet  Take 5 mg by mouth every 6 (six) hours as needed for pain. Refills: 0    oxyCODONE (OXYCONTIN) 20 mg 12 hr tablet Take 20 mg by mouth every 12 (twelve) hours.   Associated Diagnoses: Renal cell carcinoma, unspecified laterality ([name])    polyethylene glycol (MIRALAX / GLYCOLAX) packet Take 17 g by mouth daily.    testosterone (ANDROGEL) 50 MG/5GM (1%) GEL 50 mg applied once daily in the morning to the shoulder and upper arms, or abdomen Qty: 1 Tube, Refills: 1   Associated Diagnoses: Renal cell carcinoma, unspecified laterality ([name])       No Known Allergies Follow-up Information    Phillip Pier, MD Follow up in 1 week(s).   Specialty:  Oncology Contact information: [place] [address] [phone]  The results of significant diagnostics from this hospitalization (including imaging, microbiology, ancillary and laboratory) are listed below for reference.    Significant Diagnostic Studies: Dg Lumbar Spine Complete  Result Date: 10/18/2016 CLINICAL DATA:  Renal cell cancer.  Fall.  Weakness.  Pain . EXAM: LUMBAR SPINE - COMPLETE 4+ VIEW COMPARISON:  CT 09/03/2016. FINDINGS: Lumbar vertebra numbered as per prior exam. Diffuse multilevel degenerative change. L4 lesion is stable in appearance. Mild stable lumbar compressions. No acute compression fracture. Aortic atherosclerotic vascular calcification . IMPRESSION: Stable exam. L4 lesion is stable. Stable mild compressions and degenerative change. No acute compression fracture noted. Electronically Signed   By: Marcello Moores  Register   On: 10/18/2016 12:08   Mr Lumbar Spine W Wo Contrast  Result Date: 10/19/2016 CLINICAL DATA:  Metastatic renal cell. Progressive difficulty walking EXAM: MRI LUMBAR SPINE WITHOUT AND WITH CONTRAST TECHNIQUE: Multiplanar and multiecho pulse sequences of the lumbar spine were obtained without and with intravenous contrast. CONTRAST:  44mL MULTIHANCE GADOBENATE DIMEGLUMINE  529 MG/ML IV SOLN COMPARISON:  06/21/2016 FINDINGS: Segmentation:  Standard. Alignment:  Stable.  Slight anterolisthesis at L5-S1. Vertebrae: There is Phillip [name] known large treated metastasis within the L4 right posterior body, pedicle, transverse process, and articular processes. There was bulky extraosseous tumor at pretreatment scan. The lesion is chronically necrotic/cavitary after treatment, with similar changes seen in the neighboring psoas. The right psoas cavity has decreased in size and there is asymmetric atrophy of the right psoas. There is stable enhancement of intrinsic back muscles on the right more than left rib which is likely treatment related given the extraosseous extent tumor. Tumor expansion of bone and extraosseous disease causes chronic right L3 foraminal and L4 foraminal impingement. The congenitally narrow spinal canal is also moderately stenotic at the L3-4 and L4-5 levels, with right more than left subarticular recess impingement. 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 T11 also harbors Phillip [name] metastasis, as seen on more complete imaging of this level. Conus medullaris: Extends to the T12 level. There is chronic subtle enhancement of the right L4 nerve root without detectable thickening. No generalized or nodule intrathecal enhancement for intrathecal metastatic disease. Paraspinal and other soft tissues: Known retroperitoneal adenopathy, retrocaval, with staging abdominal CT most recently performed 09/03/2016. Chronic indistinct aortic wall thickening without visible wall enhancement, although there is saturation band. Disc levels: Generalized degenerative disc narrowing and mild bulging superimposed on Tionne Dayhoff congenitally narrow spinal canal. Notable disc bulge and narrowing at L5-S1 with moderate right and mild left foraminal narrowing. Small central protrusion at this level which contacts but does not compress the S1 nerve roots. IMPRESSION: 1. Stable treated L4 metastasis with chronic right L3 and L4  foraminal nerve root impingement and right L4 radiculitis type enhancement. 2. 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 Electronically Signed   By: Monte Fantasia M.D.   On: 10/19/2016 06:24    Microbiology: No results found for this or any previous visit (from the past 240 hour(s)).   Labs: Basic Metabolic Panel:  Recent Labs Lab 10/17/16 1120 10/18/16 1237 10/18/16 1531  NA 137 139  --   K 4.2 4.2  --   CL  --  103  --   CO2 28 27  --   GLUCOSE 129 130*  --   BUN 16.7 16  --   CREATININE 0.8 0.66 0.76  CALCIUM 10.6* 9.8  --    Liver Function Tests:  Recent Labs Lab 10/17/16  1120 10/18/16 1237  AST 12 16  ALT 14 17  ALKPHOS 70 57  BILITOT 0.50 0.5  PROT 7.5 6.8  ALBUMIN 4.0 4.0   No results for input(s): LIPASE, AMYLASE in the last 168 hours. No results for input(s): AMMONIA in the last 168 hours. CBC:  Recent Labs Lab 10/17/16 1120 10/18/16 1237 10/18/16 1531  WBC 10.6* 10.6* 9.5  NEUTROABS 7.2* 7.3  --   HGB 13.8 13.2 13.1  HCT 42.3 40.0 40.0  MCV 87.8 86.0 84.4  PLT 216 211 207   Cardiac Enzymes:  Recent Labs Lab 10/18/16 1237  CKTOTAL 65   BNP: BNP (last 3 results) No results for input(s): BNP in the last 8760 hours.  ProBNP (last 3 results) No results for input(s): PROBNP in the last 8760 hours.  CBG: No results for input(s): GLUCAP in the last 168 hours.     Signed:  Stephane Junkins Melven Sartorius MD.  [facility] 10/20/2016, 3:37 PM

## 2016-10-20 NOTE — Progress Notes (Signed)
Physical Therapy Treatment Patient Details Name: Phillip [name] MRN: [id number] DOB: 09/26/1945 Today's Date: 10/20/2016    History of Present Illness Nolberto Huynh is an 71 y.o. 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 MRI: negative    PT Comments    Pt making good progress towards achieving his current functional goals. Pt would greatly benefit from HHPT services upon d/c. Pt would continue to benefit from skilled physical therapy services at this time while admitted and after d/c to address the below listed limitations in order to improve overall safety and independence with functional mobility.    Follow Up Recommendations  Home health PT     Equipment Recommendations  3in1 (PT)    Recommendations for Other Services       Precautions / Restrictions Precautions Precautions: Fall Restrictions Weight Bearing Restrictions: No    Mobility  Bed Mobility Overal bed mobility: Needs Assistance Bed Mobility: Sit to Supine       Sit to supine: Min assist   General bed mobility comments: min A to return bilateral LEs onto bed  Transfers Overall transfer level: Needs assistance Equipment used: Rolling walker (2 wheeled) Transfers: Sit to/from Stand Sit to Stand: Supervision         General transfer comment: increased time  Ambulation/Gait Ambulation/Gait assistance: Min guard Ambulation Distance (Feet): 500 Feet Assistive device: Rolling walker (2 wheeled) Gait Pattern/deviations: Step-through pattern;Decreased stride length Gait velocity: decreased Gait velocity interpretation: Below normal speed for age/gender General Gait Details: no instability or LOB, min guard for safety and as pt with hx of knees buckling   Stairs            Wheelchair  Mobility    Modified Rankin (Stroke Patients Only)       Balance Overall balance assessment: Needs assistance;History of Falls Sitting-balance support: Feet supported Sitting balance-Leahy Scale: Good     Standing balance support: During functional activity;Bilateral upper extremity supported Standing balance-Leahy Scale: Poor Standing balance comment: reliant on bilateral UEs on RW                            Cognition Arousal/Alertness: Awake/alert Behavior During Therapy: WFL for tasks assessed/performed Overall Cognitive Status: Within Functional Limits for tasks assessed                                        Exercises      General Comments        Pertinent Vitals/Pain Pain Assessment: 0-10 Pain Score: 1  Pain Location: back Pain Descriptors / Indicators: Sore Pain Intervention(s): Monitored during session;Repositioned    Home Living                      Prior Function            PT Goals (current goals can now be found in the care plan section) Acute Rehab PT Goals PT Goal Formulation: With patient Time For Goal Achievement: 11/02/16 Potential to Achieve Goals: Good Progress towards PT goals: Progressing toward goals    Frequency    Min 3X/week      PT Plan Current plan remains appropriate  Co-evaluation              AM-PAC PT "6 Clicks" Daily Activity  Outcome Measure  Difficulty turning over in bed (including adjusting bedclothes, sheets and blankets)?: None Difficulty moving from lying on back to sitting on the side of the bed? : A Little Difficulty sitting down on and standing up from a chair with arms (e.g., wheelchair, bedside commode, etc,.)?: A Little Help needed moving to and from a bed to chair (including a wheelchair)?: A Little Help needed walking in hospital room?: A Little Help needed climbing 3-5 steps with a railing? : A Little 6 Click Score: 19    End of Session Equipment  Utilized During Treatment: Gait belt Activity Tolerance: Patient tolerated treatment well Patient left: in bed;with call bell/phone within reach;with family/visitor present Nurse Communication: Mobility status PT Visit Diagnosis: Unsteadiness on feet (R26.81);Other abnormalities of gait and mobility (R26.89);History of falling (Z91.81)     Time: 9150-5697 PT Time Calculation (min) (ACUTE ONLY): 25 min  Charges:  $Gait Training: 23-37 mins                    G Codes:       [place], [place], [place] Campbell 10/20/2016, 2:15 PM

## 2016-10-20 NOTE — Care Management Note (Signed)
Case Management Note  Patient Details  Name: Phillip [name] MRN: [id number] Date of Birth: 1945-12-29  Subjective/Objective:                 Spoke w patient, he would like to use [facility] for [facility] services. Referral placed for 3/1 to be delivered to room prior to DC and for [facility] services. No other CM needs identified   Action/Plan:   Expected Discharge Date:  10/20/16               Expected Discharge Plan:  [place]  In-House Referral:     Discharge planning Services  CM Consult  Post Acute Care Choice:    Choice offered to:     DME Arranged:  3-N-1 DME Agency:  [place]:  PT [place]:  [place]  Status of Service:  Completed, signed off  If discussed at [place] of Stay Meetings, dates discussed:    Additional Comments:  Carles Collet, RN 10/20/2016, 3:56 PM

## 2016-10-22 ENCOUNTER — Telehealth: Payer: Self-pay | Admitting: Radiation Therapy

## 2016-10-22 NOTE — Telephone Encounter (Addendum)
I reached out to [place] about getting him set up for the brain MRI scan that was recommended during his recent admission. They would prefer to talk to Dr. [name] about its necessity before going forward. He is due to see Med Onc on 9/5. I will call again to see if they are in agreement with having the scan after his 9/5 visit.   Mont Dutton R.T.(R)(T) Special Procedures Navigator     I spoke with [name] on 9/6 to follow-up on getting [name] a brain scan. She said that Dr. [name] does not feel that this is necessary, so they are going to pass at this time.   Mont Dutton R.T.(R)(T) Special Procedures Navigator

## 2016-10-23 DIAGNOSIS — Z9181 History of falling: Secondary | ICD-10-CM | POA: Diagnosis not present

## 2016-10-23 DIAGNOSIS — W01190D Fall on same level from slipping, tripping and stumbling with subsequent striking against furniture, subsequent encounter: Secondary | ICD-10-CM | POA: Diagnosis not present

## 2016-10-23 DIAGNOSIS — G9520 Unspecified cord compression: Secondary | ICD-10-CM | POA: Diagnosis not present

## 2016-10-23 DIAGNOSIS — C7951 Secondary malignant neoplasm of bone: Secondary | ICD-10-CM | POA: Diagnosis not present

## 2016-10-23 DIAGNOSIS — I10 Essential (primary) hypertension: Secondary | ICD-10-CM | POA: Diagnosis not present

## 2016-10-23 DIAGNOSIS — C649 Malignant neoplasm of unspecified kidney, except renal pelvis: Secondary | ICD-10-CM | POA: Diagnosis not present

## 2016-10-23 DIAGNOSIS — R630 Anorexia: Secondary | ICD-10-CM | POA: Diagnosis not present

## 2016-10-23 DIAGNOSIS — R5381 Other malaise: Secondary | ICD-10-CM | POA: Diagnosis not present

## 2016-10-23 DIAGNOSIS — R296 Repeated falls: Secondary | ICD-10-CM | POA: Diagnosis not present

## 2016-10-24 ENCOUNTER — Telehealth: Payer: Self-pay

## 2016-10-24 NOTE — Telephone Encounter (Signed)
Phillip [name] PT with [facility] called for order for PT 2x / week for 2 weeks and 3x / week for 3 weeks. Her number [phone]

## 2016-10-25 NOTE — Progress Notes (Signed)
Spoke to [employer] with [place] and verbally confirmed per MD [name] PT 2 times a week for 2 weeks and 3 times a week for 3 weeks.

## 2016-10-30 DIAGNOSIS — R5381 Other malaise: Secondary | ICD-10-CM | POA: Diagnosis not present

## 2016-10-30 DIAGNOSIS — G9520 Unspecified cord compression: Secondary | ICD-10-CM | POA: Diagnosis not present

## 2016-10-30 DIAGNOSIS — R296 Repeated falls: Secondary | ICD-10-CM | POA: Diagnosis not present

## 2016-10-30 DIAGNOSIS — W01190D Fall on same level from slipping, tripping and stumbling with subsequent striking against furniture, subsequent encounter: Secondary | ICD-10-CM | POA: Diagnosis not present

## 2016-10-30 DIAGNOSIS — C649 Malignant neoplasm of unspecified kidney, except renal pelvis: Secondary | ICD-10-CM | POA: Diagnosis not present

## 2016-10-30 DIAGNOSIS — R630 Anorexia: Secondary | ICD-10-CM | POA: Diagnosis not present

## 2016-10-30 DIAGNOSIS — C7951 Secondary malignant neoplasm of bone: Secondary | ICD-10-CM | POA: Diagnosis not present

## 2016-10-30 DIAGNOSIS — Z9181 History of falling: Secondary | ICD-10-CM | POA: Diagnosis not present

## 2016-10-30 DIAGNOSIS — I10 Essential (primary) hypertension: Secondary | ICD-10-CM | POA: Diagnosis not present

## 2016-10-31 ENCOUNTER — Other Ambulatory Visit: Payer: Self-pay | Admitting: *Deleted

## 2016-10-31 ENCOUNTER — Other Ambulatory Visit: Payer: Self-pay | Admitting: Hematology

## 2016-10-31 ENCOUNTER — Telehealth: Payer: Self-pay | Admitting: Nurse Practitioner

## 2016-10-31 ENCOUNTER — Ambulatory Visit (HOSPITAL_BASED_OUTPATIENT_CLINIC_OR_DEPARTMENT_OTHER): Payer: PPO

## 2016-10-31 ENCOUNTER — Other Ambulatory Visit (HOSPITAL_BASED_OUTPATIENT_CLINIC_OR_DEPARTMENT_OTHER): Payer: PPO

## 2016-10-31 ENCOUNTER — Ambulatory Visit (HOSPITAL_BASED_OUTPATIENT_CLINIC_OR_DEPARTMENT_OTHER): Payer: PPO | Admitting: Nurse Practitioner

## 2016-10-31 VITALS — BP 142/72 | HR 56 | Temp 98.9°F | Resp 18 | Ht 70.0 in | Wt 245.3 lb

## 2016-10-31 DIAGNOSIS — C649 Malignant neoplasm of unspecified kidney, except renal pelvis: Secondary | ICD-10-CM

## 2016-10-31 DIAGNOSIS — C7951 Secondary malignant neoplasm of bone: Secondary | ICD-10-CM

## 2016-10-31 DIAGNOSIS — Z5112 Encounter for antineoplastic immunotherapy: Secondary | ICD-10-CM

## 2016-10-31 DIAGNOSIS — C641 Malignant neoplasm of right kidney, except renal pelvis: Secondary | ICD-10-CM

## 2016-10-31 DIAGNOSIS — C642 Malignant neoplasm of left kidney, except renal pelvis: Secondary | ICD-10-CM

## 2016-10-31 DIAGNOSIS — Z95828 Presence of other vascular implants and grafts: Secondary | ICD-10-CM

## 2016-10-31 LAB — CBC WITH DIFFERENTIAL/PLATELET
BASO%: 0.4 % (ref 0.0–2.0)
Basophils Absolute: 0 10*3/uL (ref 0.0–0.1)
EOS%: 7.3 % — ABNORMAL HIGH (ref 0.0–7.0)
Eosinophils Absolute: 0.8 10*3/uL — ABNORMAL HIGH (ref 0.0–0.5)
HCT: 43.3 % (ref 38.4–49.9)
HGB: 14 g/dL (ref 13.0–17.1)
LYMPH%: 19 % (ref 14.0–49.0)
MCH: 28.3 pg (ref 27.2–33.4)
MCHC: 32.3 g/dL (ref 32.0–36.0)
MCV: 87.7 fL (ref 79.3–98.0)
MONO#: 0.8 10*3/uL (ref 0.1–0.9)
MONO%: 7.7 % (ref 0.0–14.0)
NEUT#: 7 10*3/uL — ABNORMAL HIGH (ref 1.5–6.5)
NEUT%: 65.6 % (ref 39.0–75.0)
Platelets: 220 10*3/uL (ref 140–400)
RBC: 4.94 10*6/uL (ref 4.20–5.82)
RDW: 13.7 % (ref 11.0–14.6)
WBC: 10.6 10*3/uL — ABNORMAL HIGH (ref 4.0–10.3)
lymph#: 2 10*3/uL (ref 0.9–3.3)

## 2016-10-31 LAB — COMPREHENSIVE METABOLIC PANEL
ALT: 23 U/L (ref 0–55)
AST: 15 U/L (ref 5–34)
Albumin: 4 g/dL (ref 3.5–5.0)
Alkaline Phosphatase: 71 U/L (ref 40–150)
Anion Gap: 9 mEq/L (ref 3–11)
BUN: 19.6 mg/dL (ref 7.0–26.0)
CO2: 27 mEq/L (ref 22–29)
Calcium: 10.5 mg/dL — ABNORMAL HIGH (ref 8.4–10.4)
Chloride: 99 mEq/L (ref 98–109)
Creatinine: 0.8 mg/dL (ref 0.7–1.3)
EGFR: 88 mL/min/{1.73_m2} — ABNORMAL LOW (ref >90)
Glucose: 135 mg/dl (ref 70–140)
Potassium: 4.4 mEq/L (ref 3.5–5.1)
Sodium: 135 mEq/L — ABNORMAL LOW (ref 136–145)
Total Bilirubin: 0.38 mg/dL (ref 0.20–1.20)
Total Protein: 7.6 g/dL (ref 6.4–8.3)

## 2016-10-31 MED ORDER — SODIUM CHLORIDE 0.9 % IV SOLN
240.0000 mg | Freq: Once | INTRAVENOUS | Status: AC
  Administered 2016-10-31: 240 mg via INTRAVENOUS
  Filled 2016-10-31: qty 24

## 2016-10-31 MED ORDER — HEPARIN SOD (PORK) LOCK FLUSH 100 UNIT/ML IV SOLN
500.0000 [IU] | Freq: Once | INTRAVENOUS | Status: AC | PRN
  Administered 2016-10-31: 500 [IU]
  Filled 2016-10-31: qty 5

## 2016-10-31 MED ORDER — SODIUM CHLORIDE 0.9% FLUSH
10.0000 mL | Freq: Once | INTRAVENOUS | Status: AC
  Administered 2016-10-31: 10 mL
  Filled 2016-10-31: qty 10

## 2016-10-31 MED ORDER — MIRTAZAPINE 15 MG PO TABS: 15.0000 mg | ORAL_TABLET | Freq: Every day | ORAL | 0 refills | Status: DC

## 2016-10-31 MED ORDER — SODIUM CHLORIDE 0.9% FLUSH
10.0000 mL | INTRAVENOUS | Status: DC | PRN
  Administered 2016-10-31: 10 mL
  Filled 2016-10-31: qty 10

## 2016-10-31 MED ORDER — SODIUM CHLORIDE 0.9 % IV SOLN
Freq: Once | INTRAVENOUS | Status: AC
  Administered 2016-10-31: 16:00:00 via INTRAVENOUS

## 2016-10-31 NOTE — Progress Notes (Addendum)
[facility] OFFICE PROGRESS NOTE   Diagnosis:  Renal cell carcinoma  INTERVAL HISTORY:   [name] returns as scheduled. He completed cycle 14 nivolumab 10/17/2016. He was hospitalized 10/18/2016 following a fall at home. Lumbar spine MRI showed stable treated L4 metastasis with chronic right L3 and L4 foraminal nerve root impingement and right L4 radiculitis type enhancement. Spinal canal noted to have congenital narrowing of the spinal canal exacerbated by degenerative and metastatic changes with stable moderate spinal stenosis at L3-4 and L4-5. Brain MRI was considered but not done per patient decision.  He continues to feel weak. His wife states that his "quads are weak". The right seems more so than the left. He has had no further falls. No unusual headaches. No vision change. He has mild intermittent nausea. No rash. No diarrhea. He denies lightheadedness and dizziness.  He fell in the exam room while attempting to get onto the exam table. He hit the back of his head on a cabinet. He did not lose consciousness.  Objective:  Vital signs in last 24 hours:  Blood pressure (!) 142/72, pulse (!) 56, temperature 98.9 F (37.2 C), temperature source Oral, resp. rate 18, height 5\' 10"  (1.778 m), weight 245 lb 4.8 oz (111.3 kg), SpO2 98 %.    HEENT: Pupils equal. No thrush or ulcers. Resp: Lungs clear bilaterally. Cardio: Regular rate and rhythm. GI: Abdomen soft and nontender. Vascular: No leg edema. Neuro: Alert and oriented. Proximal right leg weakness. Motor strength otherwise intact.  Skin: No rash. Small area of erythema at the posterior scalp. Port-A-Cath without erythema.    Lab Results:  Lab Results  Component Value Date   WBC 10.6 (H) 10/31/2016   HGB 14.0 10/31/2016   HCT 43.3 10/31/2016   MCV 87.7 10/31/2016   PLT 220 10/31/2016   NEUTROABS 7.0 (H) 10/31/2016    Imaging:  No results found.  Medications: I have reviewed the patient's current  medications.  Assessment/Plan: 1. Metastatic renal cell carcinoma   L4 mass with extraosseous extension, L4 nerve compression  Biopsy of the L4 mass 11/18/2015 confirmed metastatic renal cell carcinoma, clear cell type  CTs of the chest, abdomen, and pelvis 11/18/2015-right lower lobe nodule, expansile lytic lesion at the right 11th rib/costal vertebral junction, left renal mass, expansile lesion involving the L4 vertebra, lytic lesion at the left acetabulum, and a low-attenuation liver lesion  Initiation of SRS to L4 12/02/2015, Completed 12/12/2015  Initiation of Pazopanib11/04/2015  Pazopanibplaced on hold 02/06/2016 secondary to elevated liver enzymes  Pazopanibresumed 03/07/2016 at a dose of 400 mg daily  Pazopanibdiscontinued 03/19/2016 secondary to elevated liver enzymes  Restaging CTs 04/02/2016-stable left renal mass, decreased soft tissue component associated with the L4 metastasis, increased soft tissue component associated with the right 11th rib metastasis with increased T11 bony destruction, increased sclerosis at the left acetabulum lesion  Cycle 1 nivolumab 04/12/2016  Cycle 2 nivolumab 04/26/2016  Cycle 3 nivolumab03/16/2018  Cycle 4 nivolumab 05/24/2016  Cycle 5 nivolumab 06/08/2016  MRI lumbar spine 06/21/2016-unchanged tumor at L3, increased size of retroperitoneal lymph nodes compared to a CT from 04/02/2016  Cycle 6 nivolumab 06/22/2016  CTs chest, abdomen, and pelvis 07/04/2016-enlargement of the left renal mass, right adrenal nodule, left hilar and peritoneal lymph nodes, enlargement of left acetabular lesion. Stable lung nodules.  Cycle 7 nivolumab05/12/2016   Cycle 8 nivolumab 07/20/2016  Cycle 9 nivolumab 08/02/2016  Cycle 10 nivolumab 08/20/2016  Restaging CT 09/03/2016 evaluation with stable disease  Cycle 11  nivolumab 09/05/2016  Cycle 12 nivolumab 09/19/2016  Cycle 13 nivolumab 10/03/2016  Cycle 14 nivolumab  10/17/2016  Cycle 15 nivolumab 10/31/2016 2. Pain secondary to #1 3. Hypertension 4. Elevated transaminases 02/06/2016-Pazopanibplaced on hold  Liver enzymes normal 03/07/2016 5. Port-A-Cath placement 05/16/2016 6. Malaise/anorexia 09/05/2016. Cortisol and testosterone levels are low. Hydrocortisone and testosterone replacement initiated. 7. Conjunctival/scleral erythema 09/19/2016-resolved with steroid eyedrops 8. Proximal right leg weakness.   Disposition: [name]. He has completed 14 cycles of nivolumab. Plan to continue on a 2 week schedule with cycle 15 today.  He was recently hospitalized following a fall. He fell in the office today. He has proximal right leg weakness on exam. This is not a new finding. The weakness may be related to radiation, metastatic disease, bone destruction lumbar spine. He has an appointment to be evaluated by Dr. Vertell Limber tomorrow. He is participating in physical therapy. He will ambulate with a walker. He understands to contact the office if he develops headache, visual disturbance, nausea, other concerning symptoms. He and his wife expressed their understanding.  He will return for follow-up visit and nivolumab in 2 weeks.  Patient seen with Dr. [name]. 25 minutes were spent face-to-face at today's visit with the majority of that time involved in counseling/coordination of care.   [name] ANP/GNP-BC   10/31/2016  3:17 PM  This was a shared visit with [name]. [name] The leg weakness is most likely related to chronic nerve damage from the destructive process at L4. Deconditioning is also contributing. I have a low suspicion for brain metastases.  He will see Dr. Vertell Limber to evaluate the right leg weakness. He will continue physical therapy.  [name] will continue nivolumab.  Julieanne Manson, M.D.

## 2016-10-31 NOTE — Telephone Encounter (Signed)
Scheduled appt per 9/5 los - Gave patient AVS and calender per los.  

## 2016-10-31 NOTE — Patient Instructions (Signed)
[facility] Discharge Instructions for Patients Receiving Chemotherapy  Today you received the following chemotherapy agents Nivolumab  To help prevent nausea and vomiting after your treatment, we encourage you to take your nausea medication     If you develop nausea and vomiting that is not controlled by your nausea medication, call the clinic.   BELOW ARE SYMPTOMS THAT SHOULD BE REPORTED IMMEDIATELY:  *FEVER GREATER THAN 100.5 F  *CHILLS WITH OR WITHOUT FEVER  NAUSEA AND VOMITING THAT IS NOT CONTROLLED WITH YOUR NAUSEA MEDICATION  *UNUSUAL SHORTNESS OF BREATH  *UNUSUAL BRUISING OR BLEEDING  TENDERNESS IN MOUTH AND THROAT WITH OR WITHOUT PRESENCE OF ULCERS  *URINARY PROBLEMS  *BOWEL PROBLEMS  UNUSUAL RASH Items with * indicate a potential emergency and should be followed up as soon as possible.  Feel free to call the clinic you have any questions or concerns. The clinic phone number is [phone].  Please show the CHEMO ALERT CARD at check-in to the Emergency Department and triage nurse.   

## 2016-11-01 DIAGNOSIS — C7951 Secondary malignant neoplasm of bone: Secondary | ICD-10-CM | POA: Diagnosis not present

## 2016-11-01 DIAGNOSIS — I10 Essential (primary) hypertension: Secondary | ICD-10-CM | POA: Diagnosis not present

## 2016-11-01 DIAGNOSIS — M5416 Radiculopathy, lumbar region: Secondary | ICD-10-CM | POA: Diagnosis not present

## 2016-11-01 DIAGNOSIS — M545 Low back pain: Secondary | ICD-10-CM | POA: Diagnosis not present

## 2016-11-05 ENCOUNTER — Other Ambulatory Visit: Payer: Self-pay | Admitting: Oncology

## 2016-11-05 DIAGNOSIS — C649 Malignant neoplasm of unspecified kidney, except renal pelvis: Secondary | ICD-10-CM

## 2016-11-07 ENCOUNTER — Encounter: Payer: Self-pay | Admitting: Radiation Therapy

## 2016-11-07 NOTE — Progress Notes (Signed)
Dr. Vertell Limber presented Phillip [name] case during the 9/10 [facility] Conference. He was seen last week in his office to discuss the continued pain and lower extremity weakness. His legs have buckled, or knees given away a few times in the recent weeks causing him to fall. He was given a knee brace to help, and discussed a pain stimulator for the pain. Based on the group discussion, it was felt that a pain stimulator is a reasonable choice for Phillip [name]. He has not had much pain relief even with the spinal radiation he received. Dr. [name] has already consulted him in the past about his pain. Dr. Vertell Limber will refer him back to Dr. [name] for consideration of the pain stimulator. One downside to a pain stimulator is that the patient will not be able to have future MRI scans. This will affect his follow-up imaging.    Mont Dutton R.T.(R)(T) Special Procedures Navigator

## 2016-11-09 ENCOUNTER — Other Ambulatory Visit: Payer: Self-pay | Admitting: Oncology

## 2016-11-14 ENCOUNTER — Ambulatory Visit (HOSPITAL_BASED_OUTPATIENT_CLINIC_OR_DEPARTMENT_OTHER): Payer: PPO

## 2016-11-14 ENCOUNTER — Other Ambulatory Visit: Payer: Self-pay | Admitting: Emergency Medicine

## 2016-11-14 ENCOUNTER — Telehealth: Payer: Self-pay

## 2016-11-14 ENCOUNTER — Telehealth: Payer: Self-pay | Admitting: Nurse Practitioner

## 2016-11-14 ENCOUNTER — Other Ambulatory Visit (HOSPITAL_BASED_OUTPATIENT_CLINIC_OR_DEPARTMENT_OTHER): Payer: PPO

## 2016-11-14 ENCOUNTER — Ambulatory Visit: Payer: PPO

## 2016-11-14 ENCOUNTER — Ambulatory Visit (HOSPITAL_BASED_OUTPATIENT_CLINIC_OR_DEPARTMENT_OTHER): Payer: PPO | Admitting: Nurse Practitioner

## 2016-11-14 VITALS — BP 114/55 | HR 60 | Temp 98.9°F

## 2016-11-14 DIAGNOSIS — C649 Malignant neoplasm of unspecified kidney, except renal pelvis: Secondary | ICD-10-CM

## 2016-11-14 DIAGNOSIS — C641 Malignant neoplasm of right kidney, except renal pelvis: Secondary | ICD-10-CM | POA: Diagnosis not present

## 2016-11-14 DIAGNOSIS — Z5112 Encounter for antineoplastic immunotherapy: Secondary | ICD-10-CM

## 2016-11-14 DIAGNOSIS — C642 Malignant neoplasm of left kidney, except renal pelvis: Secondary | ICD-10-CM

## 2016-11-14 DIAGNOSIS — C7951 Secondary malignant neoplasm of bone: Secondary | ICD-10-CM | POA: Diagnosis not present

## 2016-11-14 DIAGNOSIS — Z95828 Presence of other vascular implants and grafts: Secondary | ICD-10-CM

## 2016-11-14 LAB — CBC WITH DIFFERENTIAL/PLATELET
BASO%: 0.5 % (ref 0.0–2.0)
Basophils Absolute: 0.1 10*3/uL (ref 0.0–0.1)
EOS%: 9.1 % — ABNORMAL HIGH (ref 0.0–7.0)
Eosinophils Absolute: 1 10*3/uL — ABNORMAL HIGH (ref 0.0–0.5)
HCT: 42.5 % (ref 38.4–49.9)
HGB: 13.9 g/dL (ref 13.0–17.1)
LYMPH%: 15.2 % (ref 14.0–49.0)
MCH: 28.7 pg (ref 27.2–33.4)
MCHC: 32.7 g/dL (ref 32.0–36.0)
MCV: 87.8 fL (ref 79.3–98.0)
MONO#: 1.1 10*3/uL — ABNORMAL HIGH (ref 0.1–0.9)
MONO%: 11 % (ref 0.0–14.0)
NEUT#: 6.7 10*3/uL — ABNORMAL HIGH (ref 1.5–6.5)
NEUT%: 64.2 % (ref 39.0–75.0)
Platelets: 199 10*3/uL (ref 140–400)
RBC: 4.84 10*6/uL (ref 4.20–5.82)
RDW: 13.7 % (ref 11.0–14.6)
WBC: 10.4 10*3/uL — ABNORMAL HIGH (ref 4.0–10.3)
lymph#: 1.6 10*3/uL (ref 0.9–3.3)

## 2016-11-14 LAB — COMPREHENSIVE METABOLIC PANEL
ALT: 14 U/L (ref 0–55)
AST: 14 U/L (ref 5–34)
Albumin: 3.8 g/dL (ref 3.5–5.0)
Alkaline Phosphatase: 65 U/L (ref 40–150)
Anion Gap: 9 mEq/L (ref 3–11)
BUN: 15.8 mg/dL (ref 7.0–26.0)
CO2: 27 mEq/L (ref 22–29)
Calcium: 10.6 mg/dL — ABNORMAL HIGH (ref 8.4–10.4)
Chloride: 101 mEq/L (ref 98–109)
Creatinine: 0.8 mg/dL (ref 0.7–1.3)
EGFR: 89 mL/min/{1.73_m2} — ABNORMAL LOW (ref >90)
Glucose: 143 mg/dl — ABNORMAL HIGH (ref 70–140)
Potassium: 4.1 mEq/L (ref 3.5–5.1)
Sodium: 138 mEq/L (ref 136–145)
Total Bilirubin: 0.43 mg/dL (ref 0.20–1.20)
Total Protein: 7.3 g/dL (ref 6.4–8.3)

## 2016-11-14 MED ORDER — SODIUM CHLORIDE 0.9 % IV SOLN
Freq: Once | INTRAVENOUS | Status: AC
  Administered 2016-11-14: 13:00:00 via INTRAVENOUS

## 2016-11-14 MED ORDER — NIVOLUMAB CHEMO INJECTION 100 MG/10ML
480.0000 mg | Freq: Once | INTRAVENOUS | Status: AC
  Administered 2016-11-14: 480 mg via INTRAVENOUS
  Filled 2016-11-14: qty 48

## 2016-11-14 MED ORDER — TESTOSTERONE 50 MG/5GM (1%) TD GEL: TRANSDERMAL | 1 refills | Status: DC

## 2016-11-14 MED ORDER — MIRTAZAPINE 30 MG PO TABS: 30.0000 mg | ORAL_TABLET | Freq: Every day | ORAL | 0 refills | Status: DC

## 2016-11-14 MED ORDER — SODIUM CHLORIDE 0.9% FLUSH
10.0000 mL | INTRAVENOUS | Status: DC | PRN
  Administered 2016-11-14: 10 mL
  Filled 2016-11-14: qty 10

## 2016-11-14 MED ORDER — HEPARIN SOD (PORK) LOCK FLUSH 100 UNIT/ML IV SOLN
500.0000 [IU] | Freq: Once | INTRAVENOUS | Status: AC | PRN
  Administered 2016-11-14: 500 [IU]
  Filled 2016-11-14: qty 5

## 2016-11-14 MED ORDER — SODIUM CHLORIDE 0.9% FLUSH
10.0000 mL | Freq: Once | INTRAVENOUS | Status: AC
  Administered 2016-11-14: 10 mL
  Filled 2016-11-14: qty 10

## 2016-11-14 NOTE — Telephone Encounter (Signed)
Remeron and testosterone medications called into the pharmacy per [name], NP

## 2016-11-14 NOTE — Telephone Encounter (Signed)
Gave patients wife AVS and calendar of upcoming October appointments.  °

## 2016-11-14 NOTE — Patient Instructions (Signed)
[facility] Discharge Instructions for Patients Receiving Chemotherapy  Today you received the following chemotherapy agents Nivolumab  To help prevent nausea and vomiting after your treatment, we encourage you to take your nausea medication     If you develop nausea and vomiting that is not controlled by your nausea medication, call the clinic.   BELOW ARE SYMPTOMS THAT SHOULD BE REPORTED IMMEDIATELY:  *FEVER GREATER THAN 100.5 F  *CHILLS WITH OR WITHOUT FEVER  NAUSEA AND VOMITING THAT IS NOT CONTROLLED WITH YOUR NAUSEA MEDICATION  *UNUSUAL SHORTNESS OF BREATH  *UNUSUAL BRUISING OR BLEEDING  TENDERNESS IN MOUTH AND THROAT WITH OR WITHOUT PRESENCE OF ULCERS  *URINARY PROBLEMS  *BOWEL PROBLEMS  UNUSUAL RASH Items with * indicate a potential emergency and should be followed up as soon as possible.  Feel free to call the clinic you have any questions or concerns. The clinic phone number is [phone].  Please show the CHEMO ALERT CARD at check-in to the Emergency Department and triage nurse.   

## 2016-11-14 NOTE — Progress Notes (Addendum)
[place] OFFICE PROGRESS NOTE   Diagnosis:  Renal cell carcinoma  INTERVAL HISTORY:   [name] as scheduled. He completed cycle 15 nivolumab 10/31/2016. He has occasional mild nausea. No mouth sores. No diarrhea. No rash. He denies shortness of breath. He has persistent right leg weakness. He is working with physical therapy. He uses a walker at home. No recent falls.  Objective:  Vital signs in last 24 hours:  Blood pressure (!) 114/55, pulse 60, temperature 98.9 F (37.2 C), temperature source Oral.    HEENT: No thrush or ulcers. Resp: Lungs clear bilaterally. Cardio: Regular rate and rhythm. GI: Abdomen soft and nontender. No hepatosplenomegaly. Vascular: No leg edema. Neuro: Proximal right leg weakness.  Skin: No rash. Port-A-Cath without erythema.    Lab Results:  Lab Results  Component Value Date   WBC 10.4 (H) 11/14/2016   HGB 13.9 11/14/2016   HCT 42.5 11/14/2016   MCV 87.8 11/14/2016   PLT 199 11/14/2016   NEUTROABS 6.7 (H) 11/14/2016    Imaging:  No results found.  Medications: I have reviewed the patient's current medications.  Assessment/Plan: 1. Metastatic renal cell carcinoma   L4 mass with extraosseous extension, L4 nerve compression  Biopsy of the L4 mass 11/18/2015 confirmed metastatic renal cell carcinoma, clear cell type  CTs of the chest, abdomen, and pelvis 11/18/2015-right lower lobe nodule, expansile lytic lesion at the right 11th rib/costal vertebral junction, left renal mass, expansile lesion involving the L4 vertebra, lytic lesion at the left acetabulum, and a low-attenuation liver lesion  Initiation of SRS to L4 12/02/2015, Completed 12/12/2015  Initiation of Pazopanib11/04/2015  Pazopanibplaced on hold 02/06/2016 secondary to elevated liver enzymes  Pazopanibresumed 03/07/2016 at a dose of 400 mg daily  Pazopanibdiscontinued 03/19/2016 secondary to elevated liver enzymes  Restaging CTs  04/02/2016-stable left renal mass, decreased soft tissue component associated with the L4 metastasis, increased soft tissue component associated with the right 11th rib metastasis with increased T11 bony destruction, increased sclerosis at the left acetabulum lesion  Cycle 1 nivolumab 04/12/2016  Cycle 2 nivolumab 04/26/2016  Cycle 3 nivolumab03/16/2018  Cycle 4 nivolumab 05/24/2016  Cycle 5 nivolumab 06/08/2016  MRI lumbar spine 06/21/2016-unchanged tumor at L3, increased size of retroperitoneal lymph nodes compared to a CT from 04/02/2016  Cycle 6 nivolumab 06/22/2016  CTs chest, abdomen, and pelvis 07/04/2016-enlargement of the left renal mass, right adrenal nodule, left hilar and peritoneal lymph nodes, enlargement of left acetabular lesion. Stable lung nodules.  Cycle 7 nivolumab05/12/2016   Cycle 8 nivolumab 07/20/2016  Cycle 9 nivolumab 08/02/2016  Cycle 10 nivolumab 08/20/2016  Restaging CT 09/03/2016 evaluation with stable disease  Cycle 11 nivolumab 09/05/2016  Cycle 12 nivolumab 09/19/2016  Cycle 13 nivolumab 10/03/2016  Cycle 14 nivolumab 10/17/2016  Cycle 15 nivolumab 10/31/2016  Cycle 16 nivolumab 11/14/2016 (changed to monthly schedule) 2. Pain secondary to #1-managed by Dr. Lovenia Shuck. 3. Hypertension 4. Elevated transaminases 02/06/2016-Pazopanibplaced on hold  Liver enzymes normal 03/07/2016 5. Port-A-Cath placement 05/16/2016 6. Malaise/anorexia 09/05/2016. Cortisol and testosterone levels low. Hydrocortisone and testosterone replacement initiated. 7. Conjunctival/scleral erythema 09/19/2016-resolved with steroid eyedrops 8. Proximal right leg weakness. 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    Disposition: Phillip [name] appears unchanged. The plan is to continue nivolumab but change to a monthly dosing schedule beginning today. He will return for labs, a follow-up visit and nivolumab in 5 weeks rather than 4  weeks per his request. He will contact the office in  the interim with any problems.  Patient seen with Dr. [name].    [name] ANP/GNP-BC   11/14/2016  12:57 PM This was a shared visit with [name]. Phillip [name] appears stable. The nivolumab will be changed to a monthly schedule. We discussed the prognosis and treatment options with Phillip [name]. He understands no therapy will be curative.  He will continue physical therapy and follow-up with neurosurgery for pain management.  We will schedule a restaging CT within the next few months.  Julieanne Manson, M.D.

## 2016-11-14 NOTE — Patient Instructions (Signed)
Implanted Port Home Guide An implanted port is a type of central line that is placed under the skin. Central lines are used to provide IV access when treatment or nutrition needs to be given through a person's veins. Implanted ports are used for long-term IV access. An implanted port may be placed because:  You need IV medicine that would be irritating to the small veins in your hands or arms.  You need long-term IV medicines, such as antibiotics.  You need IV nutrition for a long period.  You need frequent blood draws for lab tests.  You need dialysis.  Implanted ports are usually placed in the chest area, but they can also be placed in the upper arm, the abdomen, or the leg. An implanted port has two main parts:  Reservoir. The reservoir is round and will appear as a small, raised area under your skin. The reservoir is the part where a needle is inserted to give medicines or draw blood.  Catheter. The catheter is a thin, flexible tube that extends from the reservoir. The catheter is placed into a large vein. Medicine that is inserted into the reservoir goes into the catheter and then into the vein.  How will I care for my incision site? Do not get the incision site wet. Bathe or shower as directed by your health care provider. How is my port accessed? Special steps must be taken to access the port:  Before the port is accessed, a numbing cream can be placed on the skin. This helps numb the skin over the port site.  Your health care provider uses a sterile technique to access the port. ? Your health care provider must put on a mask and sterile gloves. ? The skin over your port is cleaned carefully with an antiseptic and allowed to dry. ? The port is gently pinched between sterile gloves, and a needle is inserted into the port.  Only "non-coring" port needles should be used to access the port. Once the port is accessed, a blood return should be checked. This helps ensure that the port  is in the vein and is not clogged.  If your port needs to remain accessed for a constant infusion, a clear (transparent) bandage will be placed over the needle site. The bandage and needle will need to be changed every week, or as directed by your health care provider.  Keep the bandage covering the needle clean and dry. Do not get it wet. Follow your health care provider's instructions on how to take a shower or bath while the port is accessed.  If your port does not need to stay accessed, no bandage is needed over the port.  What is flushing? Flushing helps keep the port from getting clogged. Follow your health care provider's instructions on how and when to flush the port. Ports are usually flushed with saline solution or a medicine called heparin. The need for flushing will depend on how the port is used.  If the port is used for intermittent medicines or blood draws, the port will need to be flushed: ? After medicines have been given. ? After blood has been drawn. ? As part of routine maintenance.  If a constant infusion is running, the port may not need to be flushed.  How long will my port stay implanted? The port can stay in for as long as your health care provider thinks it is needed. When it is time for the port to come out, surgery will be   done to remove it. The procedure is similar to the one performed when the port was put in. When should I seek immediate medical care? When you have an implanted port, you should seek immediate medical care if:  You notice a bad smell coming from the incision site.  You have swelling, redness, or drainage at the incision site.  You have more swelling or pain at the port site or the surrounding area.  You have a fever that is not controlled with medicine.  This information is not intended to replace advice given to you by your health care provider. Make sure you discuss any questions you have with your health care provider. Document  Released: 02/12/2005 Document Revised: 07/21/2015 Document Reviewed: 10/20/2012 Elsevier Interactive Patient Education  2017 [employer]  

## 2016-11-15 DIAGNOSIS — M5416 Radiculopathy, lumbar region: Secondary | ICD-10-CM | POA: Diagnosis not present

## 2016-11-15 DIAGNOSIS — C7951 Secondary malignant neoplasm of bone: Secondary | ICD-10-CM | POA: Diagnosis not present

## 2016-11-21 DIAGNOSIS — R531 Weakness: Secondary | ICD-10-CM | POA: Diagnosis not present

## 2016-11-21 DIAGNOSIS — M6281 Muscle weakness (generalized): Secondary | ICD-10-CM | POA: Diagnosis not present

## 2016-11-23 DIAGNOSIS — M6281 Muscle weakness (generalized): Secondary | ICD-10-CM | POA: Diagnosis not present

## 2016-11-23 DIAGNOSIS — R531 Weakness: Secondary | ICD-10-CM | POA: Diagnosis not present

## 2016-11-29 ENCOUNTER — Other Ambulatory Visit: Payer: PPO

## 2016-11-29 ENCOUNTER — Ambulatory Visit: Payer: PPO | Admitting: Oncology

## 2016-11-29 ENCOUNTER — Ambulatory Visit: Payer: PPO

## 2016-11-29 DIAGNOSIS — R531 Weakness: Secondary | ICD-10-CM | POA: Diagnosis not present

## 2016-11-29 DIAGNOSIS — M6281 Muscle weakness (generalized): Secondary | ICD-10-CM | POA: Diagnosis not present

## 2016-12-03 ENCOUNTER — Other Ambulatory Visit: Payer: Self-pay | Admitting: Oncology

## 2016-12-03 DIAGNOSIS — C649 Malignant neoplasm of unspecified kidney, except renal pelvis: Secondary | ICD-10-CM

## 2016-12-04 DIAGNOSIS — M2351 Chronic instability of knee, right knee: Secondary | ICD-10-CM | POA: Diagnosis not present

## 2016-12-04 DIAGNOSIS — M6281 Muscle weakness (generalized): Secondary | ICD-10-CM | POA: Diagnosis not present

## 2016-12-05 DIAGNOSIS — R531 Weakness: Secondary | ICD-10-CM | POA: Diagnosis not present

## 2016-12-05 DIAGNOSIS — M6281 Muscle weakness (generalized): Secondary | ICD-10-CM | POA: Diagnosis not present

## 2016-12-11 DIAGNOSIS — M6281 Muscle weakness (generalized): Secondary | ICD-10-CM | POA: Diagnosis not present

## 2016-12-11 DIAGNOSIS — R531 Weakness: Secondary | ICD-10-CM | POA: Diagnosis not present

## 2016-12-13 DIAGNOSIS — R531 Weakness: Secondary | ICD-10-CM | POA: Diagnosis not present

## 2016-12-13 DIAGNOSIS — M6281 Muscle weakness (generalized): Secondary | ICD-10-CM | POA: Diagnosis not present

## 2016-12-17 DIAGNOSIS — R531 Weakness: Secondary | ICD-10-CM | POA: Diagnosis not present

## 2016-12-17 DIAGNOSIS — M6281 Muscle weakness (generalized): Secondary | ICD-10-CM | POA: Diagnosis not present

## 2016-12-19 ENCOUNTER — Ambulatory Visit: Payer: PPO

## 2016-12-19 ENCOUNTER — Ambulatory Visit (HOSPITAL_BASED_OUTPATIENT_CLINIC_OR_DEPARTMENT_OTHER): Payer: PPO

## 2016-12-19 ENCOUNTER — Telehealth: Payer: Self-pay | Admitting: Oncology

## 2016-12-19 ENCOUNTER — Ambulatory Visit (HOSPITAL_BASED_OUTPATIENT_CLINIC_OR_DEPARTMENT_OTHER): Payer: PPO | Admitting: Oncology

## 2016-12-19 ENCOUNTER — Other Ambulatory Visit (HOSPITAL_BASED_OUTPATIENT_CLINIC_OR_DEPARTMENT_OTHER): Payer: PPO

## 2016-12-19 VITALS — BP 122/49 | HR 63 | Temp 98.0°F | Resp 18 | Ht 70.0 in | Wt 250.0 lb

## 2016-12-19 DIAGNOSIS — Z5112 Encounter for antineoplastic immunotherapy: Secondary | ICD-10-CM

## 2016-12-19 DIAGNOSIS — C7951 Secondary malignant neoplasm of bone: Secondary | ICD-10-CM

## 2016-12-19 DIAGNOSIS — C649 Malignant neoplasm of unspecified kidney, except renal pelvis: Secondary | ICD-10-CM | POA: Diagnosis not present

## 2016-12-19 DIAGNOSIS — C642 Malignant neoplasm of left kidney, except renal pelvis: Secondary | ICD-10-CM | POA: Diagnosis not present

## 2016-12-19 DIAGNOSIS — Z95828 Presence of other vascular implants and grafts: Secondary | ICD-10-CM

## 2016-12-19 LAB — COMPREHENSIVE METABOLIC PANEL
ALT: 19 U/L (ref 0–55)
AST: 12 U/L (ref 5–34)
Albumin: 3.9 g/dL (ref 3.5–5.0)
Alkaline Phosphatase: 81 U/L (ref 40–150)
Anion Gap: 10 mEq/L (ref 3–11)
BUN: 17.8 mg/dL (ref 7.0–26.0)
CO2: 25 mEq/L (ref 22–29)
Calcium: 10.6 mg/dL — ABNORMAL HIGH (ref 8.4–10.4)
Chloride: 103 mEq/L (ref 98–109)
Creatinine: 0.8 mg/dL (ref 0.7–1.3)
EGFR: >60 mL/min/{1.73_m2} (ref >60)
Glucose: 124 mg/dl (ref 70–140)
Potassium: 4.4 mEq/L (ref 3.5–5.1)
Sodium: 138 mEq/L (ref 136–145)
Total Bilirubin: 0.34 mg/dL (ref 0.20–1.20)
Total Protein: 7.2 g/dL (ref 6.4–8.3)

## 2016-12-19 LAB — CBC
HCT: 38.2 % — ABNORMAL LOW (ref 38.4–49.9)
HGB: 12.7 g/dL — ABNORMAL LOW (ref 13.0–17.1)
MCH: 28.5 pg (ref 27.2–33.4)
MCHC: 33.4 g/dL (ref 32.0–36.0)
MCV: 85.5 fL (ref 79.3–98.0)
Platelets: 194 10*3/uL (ref 140–400)
RBC: 4.47 10*6/uL (ref 4.20–5.82)
RDW: 14 % (ref 11.0–14.6)
WBC: 9.6 10*3/uL (ref 4.0–10.3)

## 2016-12-19 MED ORDER — SODIUM CHLORIDE 0.9% FLUSH
10.0000 mL | INTRAVENOUS | Status: DC | PRN
  Administered 2016-12-19: 10 mL
  Filled 2016-12-19: qty 10

## 2016-12-19 MED ORDER — SODIUM CHLORIDE 0.9% FLUSH
10.0000 mL | Freq: Once | INTRAVENOUS | Status: AC
  Administered 2016-12-19: 10 mL
  Filled 2016-12-19: qty 10

## 2016-12-19 MED ORDER — SODIUM CHLORIDE 0.9 % IV SOLN
Freq: Once | INTRAVENOUS | Status: AC
  Administered 2016-12-19: 13:00:00 via INTRAVENOUS

## 2016-12-19 MED ORDER — HEPARIN SOD (PORK) LOCK FLUSH 100 UNIT/ML IV SOLN
500.0000 [IU] | Freq: Once | INTRAVENOUS | Status: AC | PRN
  Administered 2016-12-19: 500 [IU]
  Filled 2016-12-19: qty 5

## 2016-12-19 MED ORDER — NIVOLUMAB CHEMO INJECTION 100 MG/10ML
480.0000 mg | Freq: Once | INTRAVENOUS | Status: AC
  Administered 2016-12-19: 480 mg via INTRAVENOUS
  Filled 2016-12-19: qty 48

## 2016-12-19 NOTE — Progress Notes (Signed)
[place] OFFICE PROGRESS NOTE   Diagnosis: Renal cell carcinoma  INTERVAL HISTORY:   Phillip [name] returns as scheduled. He continues to have back/right buttock pain. The pain is relieved with OxyContin and oxycodone. He takes approximate 2 oxycodone tablets per day for breakthrough pain. He gained partial relief with a spine injection by Dr. Lovenia Shuck. He is now wearing a right leg brace for ambulation. He uses a walker. He reports one fall since he was here last month. He has somnolence.  Objective:  Vital signs in last 24 hours:  Blood pressure (!) 122/49, pulse 63, temperature 98 F (36.7 C), temperature source Oral, resp. rate 18, height 5\' 10"  (1.778 m), weight 250 lb (113.4 kg), SpO2 97 %.    HEENT: No thrush or ulcers Resp: Lungs clear bilaterally Cardio: Regular rate and rhythm GI: No hepatomegaly, nontender Vascular: No leg edema Neuro: Alert and oriented. He ambulates the examination table with a walker  Skin: Mild erythema with a few 1-2 millimeter erythematous lesions at the upper anterior chest   Portacath/PICC-without erythema  Lab Results:  Lab Results  Component Value Date   WBC 9.6 12/19/2016   HGB 12.7 (L) 12/19/2016   HCT 38.2 (L) 12/19/2016   MCV 85.5 12/19/2016   PLT 194 12/19/2016   NEUTROABS 6.7 (H) 11/14/2016    CMP     Component Value Date/Time   NA 138 11/14/2016 1102   K 4.1 11/14/2016 1102   CL 103 10/18/2016 1237   CO2 27 11/14/2016 1102   GLUCOSE 143 (H) 11/14/2016 1102   BUN 15.8 11/14/2016 1102   CREATININE 0.8 11/14/2016 1102   CALCIUM 10.6 (H) 11/14/2016 1102   PROT 7.3 11/14/2016 1102   ALBUMIN 3.8 11/14/2016 1102   AST 14 11/14/2016 1102   ALT 14 11/14/2016 1102   ALKPHOS 65 11/14/2016 1102   BILITOT 0.43 11/14/2016 1102   GFRNONAA >60 10/18/2016 1531   GFRAA >60 10/18/2016 1531     Medications: I have reviewed the patient's current medications.  Assessment/Plan: 1. Metastatic renal cell carcinoma    L4 mass with extraosseous extension, L4 nerve compression  Biopsy of the L4 mass 11/18/2015 confirmed metastatic renal cell carcinoma, clear cell type  CTs of the chest, abdomen, and pelvis 11/18/2015-right lower lobe nodule, expansile lytic lesion at the right 11th rib/costal vertebral junction, left renal mass, expansile lesion involving the L4 vertebra, lytic lesion at the left acetabulum, and a low-attenuation liver lesion  Initiation of SRS to L4 12/02/2015, Completed 12/12/2015  Initiation of Pazopanib11/04/2015  Pazopanibplaced on hold 02/06/2016 secondary to elevated liver enzymes  Pazopanibresumed 03/07/2016 at a dose of 400 mg daily  Pazopanibdiscontinued 03/19/2016 secondary to elevated liver enzymes  Restaging CTs 04/02/2016-stable left renal mass, decreased soft tissue component associated with the L4 metastasis, increased soft tissue component associated with the right 11th rib metastasis with increased T11 bony destruction, increased sclerosis at the left acetabulum lesion  Cycle 1 nivolumab 04/12/2016  Cycle 2 nivolumab 04/26/2016  Cycle 3 nivolumab03/16/2018  Cycle 4 nivolumab 05/24/2016  Cycle 5 nivolumab 06/08/2016  MRI lumbar spine 06/21/2016-unchanged tumor at L3, increased size of retroperitoneal lymph nodes compared to a CT from 04/02/2016  Cycle 6 nivolumab 06/22/2016  CTs chest, abdomen, and pelvis 07/04/2016-enlargement of the left renal mass, right adrenal nodule, left hilar and peritoneal lymph nodes, enlargement of left acetabular lesion. Stable lung nodules.  Cycle 7 nivolumab05/12/2016   Cycle 8 nivolumab 07/20/2016  Cycle 9 nivolumab 08/02/2016  Cycle 10 nivolumab  08/20/2016  Restaging CT 09/03/2016 evaluation with stable disease  Cycle 11 nivolumab 09/05/2016  Cycle 12 nivolumab 09/19/2016  Cycle 13 nivolumab 10/03/2016  Cycle 14 nivolumab 10/17/2016  Cycle 15 nivolumab 10/31/2016  Cycle 16 nivolumab 11/14/2016  (changed to monthly schedule)  Cycle 17 nivolumab 12/19/2016 2. Pain secondary to #1-managed by Dr. Lovenia Shuck. 3. Hypertension 4. Elevated transaminases 02/06/2016-Pazopanibplaced on hold  Liver enzymes normal 03/07/2016 5. Port-A-Cath placement 05/16/2016 6. Malaise/anorexia 09/05/2016. Cortisol and testosterone levels low. Hydrocortisone and testosterone replacement initiated. 7. Conjunctival/scleral erythema 09/19/2016-resolved with steroid eyedrops 8. Proximal right leg weakness. 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    Disposition:  Phillip [name] appears stable. He will continue the current narcotic regimen. He has chronic stable mild hypercalcemia. I doubt this accounts for the somnolence. He will decrease the Remeron dose to 15 mg daily. His significant other will contact us for progression of somnolence.  He will be scheduled for a restaging CT evaluation prior to an office visit 01/23/2017.  15 minutes were spent with the patient today. The majority of the time was used for counseling and coordination of care.  Donneta Romberg, MD  12/19/2016  11:49 AM

## 2016-12-19 NOTE — Patient Instructions (Signed)
[facility] Discharge Instructions for Patients Receiving Chemotherapy  Today you received the following chemotherapy agents :  Opdivo.  To help prevent nausea and vomiting after your treatment, we encourage you to take your nausea medication as prescribed.   If you develop nausea and vomiting that is not controlled by your nausea medication, call the clinic.   BELOW ARE SYMPTOMS THAT SHOULD BE REPORTED IMMEDIATELY:  *FEVER GREATER THAN 100.5 F  *CHILLS WITH OR WITHOUT FEVER  NAUSEA AND VOMITING THAT IS NOT CONTROLLED WITH YOUR NAUSEA MEDICATION  *UNUSUAL SHORTNESS OF BREATH  *UNUSUAL BRUISING OR BLEEDING  TENDERNESS IN MOUTH AND THROAT WITH OR WITHOUT PRESENCE OF ULCERS  *URINARY PROBLEMS  *BOWEL PROBLEMS  UNUSUAL RASH Items with * indicate a potential emergency and should be followed up as soon as possible.  Feel free to call the clinic should you have any questions or concerns. The clinic phone number is [phone].  Please show the CHEMO ALERT CARD at check-in to the Emergency Department and triage nurse.   

## 2016-12-19 NOTE — Telephone Encounter (Signed)
Gave avs and calendar for November  °

## 2016-12-20 ENCOUNTER — Telehealth: Payer: Self-pay | Admitting: *Deleted

## 2016-12-20 DIAGNOSIS — M6281 Muscle weakness (generalized): Secondary | ICD-10-CM | POA: Diagnosis not present

## 2016-12-20 DIAGNOSIS — R531 Weakness: Secondary | ICD-10-CM | POA: Diagnosis not present

## 2016-12-20 DIAGNOSIS — C649 Malignant neoplasm of unspecified kidney, except renal pelvis: Secondary | ICD-10-CM

## 2016-12-20 LAB — TESTOSTERONE: Testosterone, Serum: 267 ng/dL (ref 264–916)

## 2016-12-20 MED ORDER — TESTOSTERONE 50 MG/5GM (1%) TD GEL: 7.5000 g | Freq: Every day | TRANSDERMAL | 1 refills | Status: DC

## 2016-12-20 NOTE — Telephone Encounter (Signed)
-----   Message from [name], MD sent at 12/20/2016  2:31 PM EDT ----- Please call patient Phillip [name] is still low, increase Androgel to 75mg  daily

## 2016-12-20 NOTE — Telephone Encounter (Signed)
Spoke with [name], instructed her to have pt increase Androgel to 75mg  daily (1.5 tubes), she voiced understanding. Confirmed dosing instructions with pt's pharmacy. Refilled called in.

## 2016-12-25 DIAGNOSIS — M6281 Muscle weakness (generalized): Secondary | ICD-10-CM | POA: Diagnosis not present

## 2016-12-25 DIAGNOSIS — R531 Weakness: Secondary | ICD-10-CM | POA: Diagnosis not present

## 2016-12-28 DIAGNOSIS — M6281 Muscle weakness (generalized): Secondary | ICD-10-CM | POA: Diagnosis not present

## 2016-12-28 DIAGNOSIS — R531 Weakness: Secondary | ICD-10-CM | POA: Diagnosis not present

## 2016-12-31 ENCOUNTER — Other Ambulatory Visit: Payer: Self-pay | Admitting: Oncology

## 2016-12-31 DIAGNOSIS — C649 Malignant neoplasm of unspecified kidney, except renal pelvis: Secondary | ICD-10-CM

## 2016-12-31 DIAGNOSIS — M6281 Muscle weakness (generalized): Secondary | ICD-10-CM | POA: Diagnosis not present

## 2016-12-31 DIAGNOSIS — R531 Weakness: Secondary | ICD-10-CM | POA: Diagnosis not present

## 2017-01-02 ENCOUNTER — Other Ambulatory Visit: Payer: Self-pay | Admitting: *Deleted

## 2017-01-02 DIAGNOSIS — M6281 Muscle weakness (generalized): Secondary | ICD-10-CM | POA: Diagnosis not present

## 2017-01-02 DIAGNOSIS — C649 Malignant neoplasm of unspecified kidney, except renal pelvis: Secondary | ICD-10-CM

## 2017-01-02 DIAGNOSIS — R531 Weakness: Secondary | ICD-10-CM | POA: Diagnosis not present

## 2017-01-02 MED ORDER — MIRTAZAPINE 30 MG PO TABS: 30.0000 mg | ORAL_TABLET | Freq: Every day | ORAL | 1 refills | Status: DC

## 2017-01-18 ENCOUNTER — Telehealth: Payer: Self-pay | Admitting: *Deleted

## 2017-01-18 NOTE — Telephone Encounter (Signed)
Message from [place] reporting pt will run out of Androgel on Monday. Confirmed with Dr. [name] he is to continue same dose. Instructed her to contact pharmacy for refill. She agreed to do so. She will contact us if insurance requires prior auth or override.

## 2017-01-20 ENCOUNTER — Other Ambulatory Visit: Payer: Self-pay | Admitting: Oncology

## 2017-01-21 ENCOUNTER — Other Ambulatory Visit: Payer: Self-pay | Admitting: Oncology

## 2017-01-21 ENCOUNTER — Other Ambulatory Visit (HOSPITAL_BASED_OUTPATIENT_CLINIC_OR_DEPARTMENT_OTHER): Payer: PPO

## 2017-01-21 ENCOUNTER — Ambulatory Visit: Payer: PPO

## 2017-01-21 ENCOUNTER — Ambulatory Visit (HOSPITAL_COMMUNITY)
Admission: RE | Admit: 2017-01-21 | Discharge: 2017-01-21 | Disposition: A | Discharge status: Home / Self Care | Payer: PPO | Source: Ambulatory Visit | Attending: Oncology | Admitting: Oncology

## 2017-01-21 DIAGNOSIS — C649 Malignant neoplasm of unspecified kidney, except renal pelvis: Secondary | ICD-10-CM | POA: Insufficient documentation

## 2017-01-21 DIAGNOSIS — N400 Enlarged prostate without lower urinary tract symptoms: Secondary | ICD-10-CM | POA: Insufficient documentation

## 2017-01-21 DIAGNOSIS — M4856XA Collapsed vertebra, not elsewhere classified, lumbar region, initial encounter for fracture: Secondary | ICD-10-CM | POA: Diagnosis not present

## 2017-01-21 DIAGNOSIS — C7972 Secondary malignant neoplasm of left adrenal gland: Secondary | ICD-10-CM | POA: Diagnosis not present

## 2017-01-21 DIAGNOSIS — K8020 Calculus of gallbladder without cholecystitis without obstruction: Secondary | ICD-10-CM | POA: Diagnosis not present

## 2017-01-21 DIAGNOSIS — C7951 Secondary malignant neoplasm of bone: Secondary | ICD-10-CM | POA: Diagnosis not present

## 2017-01-21 DIAGNOSIS — Z79899 Other long term (current) drug therapy: Secondary | ICD-10-CM

## 2017-01-21 DIAGNOSIS — R911 Solitary pulmonary nodule: Secondary | ICD-10-CM | POA: Diagnosis not present

## 2017-01-21 DIAGNOSIS — R590 Localized enlarged lymph nodes: Secondary | ICD-10-CM | POA: Insufficient documentation

## 2017-01-21 DIAGNOSIS — I2510 Atherosclerotic heart disease of native coronary artery without angina pectoris: Secondary | ICD-10-CM | POA: Insufficient documentation

## 2017-01-21 DIAGNOSIS — C7971 Secondary malignant neoplasm of right adrenal gland: Secondary | ICD-10-CM | POA: Diagnosis not present

## 2017-01-21 DIAGNOSIS — Z95828 Presence of other vascular implants and grafts: Secondary | ICD-10-CM

## 2017-01-21 LAB — CBC WITH DIFFERENTIAL/PLATELET
BASO%: 0.5 % (ref 0.0–2.0)
Basophils Absolute: 0.1 10*3/uL (ref 0.0–0.1)
EOS%: 11.2 % — ABNORMAL HIGH (ref 0.0–7.0)
Eosinophils Absolute: 1.2 10*3/uL — ABNORMAL HIGH (ref 0.0–0.5)
HCT: 39.5 % (ref 38.4–49.9)
HGB: 12.9 g/dL — ABNORMAL LOW (ref 13.0–17.1)
LYMPH%: 17.6 % (ref 14.0–49.0)
MCH: 28.8 pg (ref 27.2–33.4)
MCHC: 32.7 g/dL (ref 32.0–36.0)
MCV: 88.2 fL (ref 79.3–98.0)
MONO#: 1 10*3/uL — ABNORMAL HIGH (ref 0.1–0.9)
MONO%: 9 % (ref 0.0–14.0)
NEUT#: 6.5 10*3/uL (ref 1.5–6.5)
NEUT%: 61.7 % (ref 39.0–75.0)
Platelets: 213 10*3/uL (ref 140–400)
RBC: 4.48 10*6/uL (ref 4.20–5.82)
RDW: 13.9 % (ref 11.0–14.6)
WBC: 10.5 10*3/uL — ABNORMAL HIGH (ref 4.0–10.3)
lymph#: 1.9 10*3/uL (ref 0.9–3.3)

## 2017-01-21 LAB — COMPREHENSIVE METABOLIC PANEL
ALT: 18 U/L (ref 0–55)
AST: 13 U/L (ref 5–34)
Albumin: 4 g/dL (ref 3.5–5.0)
Alkaline Phosphatase: 73 U/L (ref 40–150)
Anion Gap: 10 mEq/L (ref 3–11)
BUN: 17.9 mg/dL (ref 7.0–26.0)
CO2: 27 mEq/L (ref 22–29)
Calcium: 11.1 mg/dL — ABNORMAL HIGH (ref 8.4–10.4)
Chloride: 100 mEq/L (ref 98–109)
Creatinine: 0.8 mg/dL (ref 0.7–1.3)
EGFR: >60 mL/min/{1.73_m2} (ref >60)
Glucose: 117 mg/dl (ref 70–140)
Potassium: 4.6 mEq/L (ref 3.5–5.1)
Sodium: 137 mEq/L (ref 136–145)
Total Bilirubin: 0.39 mg/dL (ref 0.20–1.20)
Total Protein: 7.3 g/dL (ref 6.4–8.3)

## 2017-01-21 LAB — TSH: TSH: 0.646 m(IU)/L (ref 0.320–4.118)

## 2017-01-21 MED ORDER — HEPARIN SOD (PORK) LOCK FLUSH 100 UNIT/ML IV SOLN
INTRAVENOUS | Status: AC
  Filled 2017-01-21: qty 5

## 2017-01-21 MED ORDER — IOPAMIDOL (ISOVUE-300) INJECTION 61%
INTRAVENOUS | Status: AC
  Filled 2017-01-21: qty 100

## 2017-01-21 MED ORDER — HEPARIN SOD (PORK) LOCK FLUSH 100 UNIT/ML IV SOLN
500.0000 [IU] | Freq: Once | INTRAVENOUS | Status: AC
  Administered 2017-01-21: 500 [IU] via INTRAVENOUS

## 2017-01-21 MED ORDER — SODIUM CHLORIDE 0.9% FLUSH
10.0000 mL | Freq: Once | INTRAVENOUS | Status: AC
  Administered 2017-01-21: 10 mL
  Filled 2017-01-21: qty 10

## 2017-01-21 MED ORDER — IOPAMIDOL (ISOVUE-300) INJECTION 61%
100.0000 mL | Freq: Once | INTRAVENOUS | Status: AC | PRN
  Administered 2017-01-21: 100 mL via INTRAVENOUS

## 2017-01-22 DIAGNOSIS — R531 Weakness: Secondary | ICD-10-CM | POA: Diagnosis not present

## 2017-01-22 DIAGNOSIS — M6281 Muscle weakness (generalized): Secondary | ICD-10-CM | POA: Diagnosis not present

## 2017-01-22 LAB — T4: Thyroxine (T4): 7.2 ug/dL (ref 4.5–12.0)

## 2017-01-22 LAB — T3 UPTAKE
Free Thyroxine Index: 1.8 (ref 1.2–4.9)
T3 Uptake Ratio: 25 % (ref 24–39)

## 2017-01-22 LAB — T4, FREE: T4,Free(Direct): 1.13 ng/dL (ref 0.82–1.77)

## 2017-01-23 ENCOUNTER — Telehealth: Payer: Self-pay | Admitting: Pharmacist

## 2017-01-23 ENCOUNTER — Ambulatory Visit (HOSPITAL_BASED_OUTPATIENT_CLINIC_OR_DEPARTMENT_OTHER): Payer: PPO

## 2017-01-23 ENCOUNTER — Other Ambulatory Visit: Payer: Self-pay | Admitting: Nurse Practitioner

## 2017-01-23 ENCOUNTER — Ambulatory Visit (HOSPITAL_BASED_OUTPATIENT_CLINIC_OR_DEPARTMENT_OTHER): Payer: PPO | Admitting: Oncology

## 2017-01-23 ENCOUNTER — Telehealth: Payer: Self-pay | Admitting: Oncology

## 2017-01-23 VITALS — BP 129/52 | HR 77 | Temp 98.4°F | Resp 18 | Ht 70.0 in | Wt 259.8 lb

## 2017-01-23 DIAGNOSIS — G893 Neoplasm related pain (acute) (chronic): Secondary | ICD-10-CM | POA: Diagnosis not present

## 2017-01-23 DIAGNOSIS — C649 Malignant neoplasm of unspecified kidney, except renal pelvis: Secondary | ICD-10-CM

## 2017-01-23 DIAGNOSIS — C642 Malignant neoplasm of left kidney, except renal pelvis: Secondary | ICD-10-CM

## 2017-01-23 DIAGNOSIS — Z95828 Presence of other vascular implants and grafts: Secondary | ICD-10-CM

## 2017-01-23 DIAGNOSIS — C7951 Secondary malignant neoplasm of bone: Secondary | ICD-10-CM

## 2017-01-23 DIAGNOSIS — R4182 Altered mental status, unspecified: Secondary | ICD-10-CM

## 2017-01-23 MED ORDER — SODIUM CHLORIDE 0.9% FLUSH
10.0000 mL | INTRAVENOUS | Status: DC | PRN
  Administered 2017-01-23: 10 mL
  Filled 2017-01-23: qty 10

## 2017-01-23 MED ORDER — ZOLEDRONIC ACID 4 MG/100ML IV SOLN
4.0000 mg | Freq: Once | INTRAVENOUS | Status: AC
  Administered 2017-01-23: 4 mg via INTRAVENOUS
  Filled 2017-01-23: qty 100

## 2017-01-23 MED ORDER — HEPARIN SOD (PORK) LOCK FLUSH 100 UNIT/ML IV SOLN
500.0000 [IU] | Freq: Once | INTRAVENOUS | Status: AC | PRN
  Administered 2017-01-23: 500 [IU]
  Filled 2017-01-23: qty 5

## 2017-01-23 MED ORDER — SODIUM CHLORIDE 0.9 % IV SOLN
Freq: Once | INTRAVENOUS | Status: AC
  Administered 2017-01-23: 12:00:00 via INTRAVENOUS

## 2017-01-23 NOTE — Telephone Encounter (Signed)
Oral Oncology Pharmacist Encounter  Received new referrals for Cabometyx (cabozantinib) or Sutent (sunitinib) for the treatment of metastatic renal cell carcinoma, planned duration until disease progression or unacceptable toxicity.  Patient previously experienced markedly elevated LFTs on pazopanib despite dose reduction. Pazopanib discontinued and patient started on nivolumab. Now with progression of disease being considered for alternative maintenance therapy.  Labs from 01/21/17 assessed, OK for treatment. BPs in Epic reviewed, WNL, will be monitored 10/18/16 urine protein (-) 01/21/17 TSH WNL No upcoming invasive procedures noted in Epic  Current medication list in Epic reviewed, no DDIs with Cabometyx or Sutent identified.  Will will await final decision by MD for treatment choice..  Oral Oncology Clinic will continue to follow.  [name], PharmD, BCPS, BCOP 01/23/2017 10:58 AM Oral Oncology Clinic [phone]

## 2017-01-23 NOTE — Telephone Encounter (Signed)
Scheduled appt per 11/28 los- Gave patient AVS and calender per los.  

## 2017-01-23 NOTE — Patient Instructions (Signed)

## 2017-01-23 NOTE — Progress Notes (Signed)
[facility] OFFICE PROGRESS NOTE   Diagnosis: Renal cell carcinoma  INTERVAL HISTORY:   Phillip [name] returns as scheduled.  He completed other treatment with nivolumab 12/19/2016. He has persistent pain in the right leg. Good appetite. His energy level remains poor. His significant other reports he has malaise and diminished verbal communication. He is participating in physical therapy.  Objective:  Vital signs in last 24 hours:  There were no vitals taken for this visit.    Resp: Lungs clear bilaterally Cardio: Regular rate and rhythm GI: No hepatosplenomegaly, nontender, no mass Vascular: No leg edema Neuro: The motor exam appears intact in the legs bilaterally, he is able to ambulate to the examination table with assistance      Lab Results:  Lab Results  Component Value Date   WBC 10.5 (H) 01/21/2017   HGB 12.9 (L) 01/21/2017   HCT 39.5 01/21/2017   MCV 88.2 01/21/2017   PLT 213 01/21/2017   NEUTROABS 6.5 01/21/2017    CMP     Component Value Date/Time   NA 137 01/21/2017 1113   K 4.6 01/21/2017 1113   CL 103 10/18/2016 1237   CO2 27 01/21/2017 1113   GLUCOSE 117 01/21/2017 1113   BUN 17.9 01/21/2017 1113   CREATININE 0.8 01/21/2017 1113   CALCIUM 11.1 (H) 01/21/2017 1113   PROT 7.3 01/21/2017 1113   ALBUMIN 4.0 01/21/2017 1113   AST 13 01/21/2017 1113   ALT 18 01/21/2017 1113   ALKPHOS 73 01/21/2017 1113   BILITOT 0.39 01/21/2017 1113   GFRNONAA >60 10/18/2016 1531   GFRAA >60 10/18/2016 1531     Imaging:  Ct Abdomen Pelvis W Wo Contrast  Result Date: 01/21/2017 CLINICAL DATA:  Metastatic left renal cell carcinoma diagnosed in 2017, prior radiation therapy to the spine, ongoing immunotherapy. Weakness. EXAM: CT CHEST WITH CONTRAST CT ABDOMEN AND PELVIS WITH AND WITHOUT CONTRAST TECHNIQUE: Multidetector CT imaging of the chest was performed during intravenous contrast administration. Multidetector CT imaging of the abdomen and pelvis  was performed following the standard protocol before and during bolus administration of intravenous contrast. CONTRAST:  133mL ISOVUE-300 IOPAMIDOL (ISOVUE-300) INJECTION 61% COMPARISON:  Multiple exams, including 09/03/2016 and lumbar MRI from 10/19/2016 FINDINGS: CT CHEST FINDINGS Cardiovascular: Right IJ Port-A-Cath tip:  Cavoatrial junction. Coronary, aortic arch, and branch vessel atherosclerotic vascular disease. Mediastinum/Nodes: Stable nodularity of the left inferior thyroid lobe. A left paraesophageal lymph node below the left mainstem bronchus measures 2.0 cm in short axis, stable. Lungs/Pleura: A left upper lobe pulmonary nodule adjacent to the hilum measures 2.0 by 1.7 cm on image 45/4, previously 1.7 by 1.5 cm by my measurements. Triangular-shaped right lower lobe pulmonary nodule 0.8 by 0.4 cm on image 82/10 with a small adjacent 1-2 mm nodule on image 80. Musculoskeletal: Progressive lytic expansile destruction of the right second rib medially. Expansile lytic lesion of the right twelfth rib medially eroding the adjacent T12 vertebral body, similar to prior. Enlargement of the T1 vertebral lytic lesion, 1.4 by 1.2 cm today and previously approximately 0.8 by 0.8 cm. CT ABDOMEN AND PELVIS FINDINGS Hepatobiliary: Cholelithiasis. Stable suspected cyst in the left hepatic lobe on image 23/3 measuring 1.8 by 1.6 cm. Pancreas: Unremarkable Spleen: Unremarkable Adrenals/Urinary Tract: Bilateral adrenal masses include a 3.3 by 2.9 cm right adrenal mass on image 102/4 (formerly 2.5 by 2.0 cm). The left adrenal masses have likewise enlarged. Exophytic mass of the left kidney upper pole with associated central necrosis, 6.4 by 6.0 cm on  image 116/4 (previously 6.0 by 6.0 cm). This mass has marginal lobulations and abuts the spleen. No upper abdominal urinary tract calculus is observed. No abnormal urothelial portal venous phase enhancement or filling defect along the urothelium in the upper abdomen.  Stomach/Bowel: Unremarkable Vascular/Lymphatic: Aortoiliac atherosclerotic vascular disease. Pathologic retroperitoneal adenopathy noted with a retrocaval node measuring 2.7 cm in short axis on image 116/4 (formerly 2.6 cm by my measurement). Indistinct rind of density around the distal abdominal aorta and proximal iliac tree unchanged from prior. A left inguinal lymph node measures 1.3 cm in short axis on image 212/4, formerly the same. Reproductive: Prostatomegaly, stable. Other: No supplemental non-categorized findings. Musculoskeletal: New 60% superior endplate compression fracture at L1, may be benign. Essentially stable large lytic mass of the right L4 vertebral body, pedicle, transverse process, and a lamina with prominent obscuration of the right L4-5 intervertebral space and impingement at this level. Epidural tumor is likely for example on image 145/4. Enlargement of the lytic expansile left anterior acetabular wall mass which now demonstrates soft tissue component and questionable invasion into the hip joint, the mass measures 4.8 by 4.2 cm on image 194/4 (formerly 3.0 by 2.6 cm at this level). The expansile mass causes some extrinsic compression on the left common iliac vein. IMPRESSION: 1. General worsening appearance with enlargement of the dominant left kidney upper pole exophytic mass, and enlarging lytic osseous metastatic lesions. The left upper lobe pulmonary nodule has enlarged and the bilateral adrenal metastatic lesions have enlarged. Abdominal, thoracic, and left inguinal adenopathy is stable. I cannot exclude epidural tumor associated with the lytic L4 vertebral mass. 2. An interval 60% superior endplate compression fracture at L1 could be benign in origin. 3. Other imaging findings of potential clinical significance: Aortic Atherosclerosis (ICD10-I70.0). Coronary atherosclerosis. Cholelithiasis. Prostatomegaly. Electronically Signed   By: Van Clines M.D.   On: 01/21/2017 14:39   Ct  Chest W Contrast  Result Date: 01/21/2017 CLINICAL DATA:  Metastatic left renal cell carcinoma diagnosed in 2017, prior radiation therapy to the spine, ongoing immunotherapy. Weakness. EXAM: CT CHEST WITH CONTRAST CT ABDOMEN AND PELVIS WITH AND WITHOUT CONTRAST TECHNIQUE: Multidetector CT imaging of the chest was performed during intravenous contrast administration. Multidetector CT imaging of the abdomen and pelvis was performed following the standard protocol before and during bolus administration of intravenous contrast. CONTRAST:  171mL ISOVUE-300 IOPAMIDOL (ISOVUE-300) INJECTION 61% COMPARISON:  Multiple exams, including 09/03/2016 and lumbar MRI from 10/19/2016 FINDINGS: CT CHEST FINDINGS Cardiovascular: Right IJ Port-A-Cath tip:  Cavoatrial junction. Coronary, aortic arch, and branch vessel atherosclerotic vascular disease. Mediastinum/Nodes: Stable nodularity of the left inferior thyroid lobe. A left paraesophageal lymph node below the left mainstem bronchus measures 2.0 cm in short axis, stable. Lungs/Pleura: A left upper lobe pulmonary nodule adjacent to the hilum measures 2.0 by 1.7 cm on image 45/4, previously 1.7 by 1.5 cm by my measurements. Triangular-shaped right lower lobe pulmonary nodule 0.8 by 0.4 cm on image 82/10 with a small adjacent 1-2 mm nodule on image 80. Musculoskeletal: Progressive lytic expansile destruction of the right second rib medially. Expansile lytic lesion of the right twelfth rib medially eroding the adjacent T12 vertebral body, similar to prior. Enlargement of the T1 vertebral lytic lesion, 1.4 by 1.2 cm today and previously approximately 0.8 by 0.8 cm. CT ABDOMEN AND PELVIS FINDINGS Hepatobiliary: Cholelithiasis. Stable suspected cyst in the left hepatic lobe on image 23/3 measuring 1.8 by 1.6 cm. Pancreas: Unremarkable Spleen: Unremarkable Adrenals/Urinary Tract: Bilateral adrenal masses include a 3.3 by  2.9 cm right adrenal mass on image 102/4 (formerly 2.5 by 2.0 cm).  The left adrenal masses have likewise enlarged. Exophytic mass of the left kidney upper pole with associated central necrosis, 6.4 by 6.0 cm on image 116/4 (previously 6.0 by 6.0 cm). This mass has marginal lobulations and abuts the spleen. No upper abdominal urinary tract calculus is observed. No abnormal urothelial portal venous phase enhancement or filling defect along the urothelium in the upper abdomen. Stomach/Bowel: Unremarkable Vascular/Lymphatic: Aortoiliac atherosclerotic vascular disease. Pathologic retroperitoneal adenopathy noted with a retrocaval node measuring 2.7 cm in short axis on image 116/4 (formerly 2.6 cm by my measurement). Indistinct rind of density around the distal abdominal aorta and proximal iliac tree unchanged from prior. A left inguinal lymph node measures 1.3 cm in short axis on image 212/4, formerly the same. Reproductive: Prostatomegaly, stable. Other: No supplemental non-categorized findings. Musculoskeletal: New 60% superior endplate compression fracture at L1, may be benign. Essentially stable large lytic mass of the right L4 vertebral body, pedicle, transverse process, and a lamina with prominent obscuration of the right L4-5 intervertebral space and impingement at this level. Epidural tumor is likely for example on image 145/4. Enlargement of the lytic expansile left anterior acetabular wall mass which now demonstrates soft tissue component and questionable invasion into the hip joint, the mass measures 4.8 by 4.2 cm on image 194/4 (formerly 3.0 by 2.6 cm at this level). The expansile mass causes some extrinsic compression on the left common iliac vein. IMPRESSION: 1. General worsening appearance with enlargement of the dominant left kidney upper pole exophytic mass, and enlarging lytic osseous metastatic lesions. The left upper lobe pulmonary nodule has enlarged and the bilateral adrenal metastatic lesions have enlarged. Abdominal, thoracic, and left inguinal adenopathy is  stable. I cannot exclude epidural tumor associated with the lytic L4 vertebral mass. 2. An interval 60% superior endplate compression fracture at L1 could be benign in origin. 3. Other imaging findings of potential clinical significance: Aortic Atherosclerosis (ICD10-I70.0). Coronary atherosclerosis. Cholelithiasis. Prostatomegaly. Electronically Signed   By: Van Clines M.D.   On: 01/21/2017 14:39    Medications: I have reviewed the patient's current medications.  Assessment/Plan: 1. Metastatic renal cell carcinoma   L4 mass with extraosseous extension, L4 nerve compression  Biopsy of the L4 mass 11/18/2015 confirmed metastatic renal cell carcinoma, clear cell type  CTs of the chest, abdomen, and pelvis 11/18/2015-right lower lobe nodule, expansile lytic lesion at the right 11th rib/costal vertebral junction, left renal mass, expansile lesion involving the L4 vertebra, lytic lesion at the left acetabulum, and a low-attenuation liver lesion  Initiation of SRS to L4 12/02/2015, Completed 12/12/2015  Initiation of Pazopanib11/04/2015  Pazopanibplaced on hold 02/06/2016 secondary to elevated liver enzymes  Pazopanibresumed 03/07/2016 at a dose of 400 mg daily  Pazopanibdiscontinued 03/19/2016 secondary to elevated liver enzymes  Restaging CTs 04/02/2016-stable left renal mass, decreased soft tissue component associated with the L4 metastasis, increased soft tissue component associated with the right 11th rib metastasis with increased T11 bony destruction, increased sclerosis at the left acetabulum lesion  Cycle 1 nivolumab 04/12/2016  Cycle 2 nivolumab 04/26/2016  Cycle 3 nivolumab03/16/2018  Cycle 4 nivolumab 05/24/2016  Cycle 5 nivolumab 06/08/2016  MRI lumbar spine 06/21/2016-unchanged tumor at L3, increased size of retroperitoneal lymph nodes compared to a CT from 04/02/2016  Cycle 6 nivolumab 06/22/2016  CTs chest, abdomen, and pelvis 07/04/2016-enlargement of  the left renal mass, right adrenal nodule, left hilar and peritoneal lymph nodes, enlargement of left acetabular lesion.  Stable lung nodules.  Cycle 7 nivolumab05/12/2016   Cycle 8 nivolumab 07/20/2016  Cycle 9 nivolumab 08/02/2016  Cycle 10 nivolumab 08/20/2016  Restaging CT 09/03/2016 evaluation with stable disease  Cycle 11 nivolumab 09/05/2016  Cycle 12 nivolumab 09/19/2016  Cycle 13 nivolumab 10/03/2016  Cycle 14 nivolumab 10/17/2016  Cycle 15 nivolumab 10/31/2016  Cycle 16 nivolumab 11/14/2016 (changed to monthly schedule)  Cycle 17 nivolumab 12/19/2016  CTs 01/21/2017-increased left renal mass, increased size of adrenal metastases, increased lytic bone lesions, increased left lung nodule, persistent tumor at L4 with probable epidural component 2. Pain secondary to #1-managed by Dr. Lovenia Shuck. 3. Hypertension 4. Elevated transaminases 02/06/2016-Pazopanibplaced on hold  Liver enzymes normal 03/07/2016 5. Port-A-Cath placement 05/16/2016 6. Malaise/anorexia 09/05/2016. Cortisol and testosterone levels low. Hydrocortisone and testosterone replacement initiated. 7. Conjunctival/scleral erythema 09/19/2016-resolved with steroid eyedrops 8. 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    Disposition:  Phillip [name] appears unchanged. The restaging CTs confirmed disease progression. He will discontinue nivolumab.  He has hypercalcemia today that. This may contribute to his malaise and altered mental status. He will begin Zometa for treatment of hypercalcemia and prophylaxis against further fractures. I reviewed the potential toxicities associated with Zometa and he agrees to proceed.  We discussed treatment options for the renal cell carcinoma. I recommend salvage systemic therapy with cabozontinib. We discussed potential toxicities associated with this agent including the chance of hemorrhage, elevated liver  enzymes, bowel perforation, thromboembolic disease, hypertension, and diarrhea. He will receive teaching from the Cancer center pharmacist. He agrees to proceed.  We will follow the liver enzymes closely given his history of elevated liver enzymes when treated with pozapanib.  40 minutes were spent with the patient today. The majority of the time was used for counseling and coordination of care.   Betsy Coder, MD  01/23/2017  10:13 AM

## 2017-01-25 ENCOUNTER — Telehealth: Payer: Self-pay | Admitting: Pharmacy Technician

## 2017-01-25 MED ORDER — CABOZANTINIB S-MALATE 40 MG PO TABS: 40.0000 mg | ORAL_TABLET | Freq: Every day | ORAL | 0 refills | Status: DC

## 2017-01-25 MED FILL — CABOMETYX 40 MG TABLET: 40 | 30 days supply | Qty: 30 | Fill #0

## 2017-01-25 NOTE — Telephone Encounter (Signed)
thanks

## 2017-01-25 NOTE — Telephone Encounter (Signed)
Oral Chemotherapy Pharmacist Encounter   I spoke with patient's significant other and caregiver, [name], for overview of new oral chemotherapy medication: Cabometyx for the treatment of previously treated, metastatic renal cell carcinoma, planned duration until disease progression or unacceptable toxicity.   Counseled on administration, dosing, side effects, monitoring, drug-food interactions, safe handling, storage, and disposal.  Patient will take Cabometyx 40mg  tablets, 1 tablet (40mg ) by mouth once daily on an empty stomach, 1 hour before or 2 hours after a meal. Patient will avoid grapefruit and grapefruit juice. Cabometyx start date: 01/29/17  Side effects include but not limited to: diarrhea, nausea, decreased appetite, fatigue, hypertension, hand-foot syndrome, decreased blood counts, and electrolyte abnormalities. Patient has loperamide at home and will call the office if diarrhea develops.    Reviewed with patient importance of keeping a medication schedule and plan for any missed doses.  Lore voiced understanding and appreciation.   All questions answered. Medication reconciliation performed and medication/allergy list updated.  Patient transferred to Oral Oncology Patient Advocate to secure copayment grant. The pharmacy will order patient's Cabometyx and it will be ready for pick-up on Monday 01/28/17, plans to start 01/29/17.   [name] knows to call the office with questions or concerns. Oral Oncology Clinic will continue to follow.  Thank you,  [name], PharmD, BCPS, BCOP 01/25/2017 11:56 AM Oral Oncology Clinic [phone]

## 2017-01-25 NOTE — Telephone Encounter (Signed)
Oral Oncology Patient Advocate Encounter  Was successful in securing patient an $10,000 grant from [employer] to provide copayment coverage for his Cabometyx.  This will keep the out of pocket expense at $0.    I have spoken with the patient.    The billing information is as follows and has been shared with [place].   Member ID: [id number] Group ID: [id number] RxBin: [id number] Dates of Eligibility: 12/26/2016 through 12/25/2017  Phillip [name]. Melynda Keller, [place] [facility] [place] [phone] 01/25/2017 1:05 PM

## 2017-01-28 DIAGNOSIS — M6281 Muscle weakness (generalized): Secondary | ICD-10-CM | POA: Diagnosis not present

## 2017-01-28 DIAGNOSIS — R531 Weakness: Secondary | ICD-10-CM | POA: Diagnosis not present

## 2017-01-31 ENCOUNTER — Telehealth: Payer: Self-pay | Admitting: Pharmacist

## 2017-01-31 NOTE — Telephone Encounter (Signed)
Oral Oncology Pharmacist Encounter  Received call from patient's caretaker, [name], about Cabometyx warning for hemorrhage. Patient has upcoming spinal steroid injection planned for next week. 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 D/w MD. No need to hold Cabometyx for upcoming injection. Patient with follow-up in office on 12/12, will assess injection site at that time if needed.  Answered all questions about Cabomtyex administration.  [name] knows to call the office with further questions or concerns.  Oral Oncology Clinic will continue to follow.  [name], PharmD, BCPS, BCOP 01/31/2017 10:39 AM Oral Oncology Clinic [phone]

## 2017-01-31 NOTE — Telephone Encounter (Signed)
Oral Oncology Pharmacist Encounter  Cabometyx start date: 01/28/17  Oral Oncology Clinic will continue to follow.  [name], PharmD, BCPS, BCOP 01/31/2017 10:36 AM Oral Oncology Clinic [phone]

## 2017-02-01 DIAGNOSIS — R531 Weakness: Secondary | ICD-10-CM | POA: Diagnosis not present

## 2017-02-01 DIAGNOSIS — M6281 Muscle weakness (generalized): Secondary | ICD-10-CM | POA: Diagnosis not present

## 2017-02-06 ENCOUNTER — Ambulatory Visit (HOSPITAL_BASED_OUTPATIENT_CLINIC_OR_DEPARTMENT_OTHER): Payer: PPO

## 2017-02-06 ENCOUNTER — Other Ambulatory Visit: Payer: Self-pay | Admitting: Emergency Medicine

## 2017-02-06 ENCOUNTER — Other Ambulatory Visit (HOSPITAL_BASED_OUTPATIENT_CLINIC_OR_DEPARTMENT_OTHER): Payer: PPO

## 2017-02-06 ENCOUNTER — Ambulatory Visit (HOSPITAL_BASED_OUTPATIENT_CLINIC_OR_DEPARTMENT_OTHER): Payer: PPO | Admitting: Nurse Practitioner

## 2017-02-06 ENCOUNTER — Telehealth: Payer: Self-pay | Admitting: Nurse Practitioner

## 2017-02-06 VITALS — BP 133/61 | HR 70 | Temp 98.9°F | Resp 20 | Ht 70.0 in | Wt 258.7 lb

## 2017-02-06 DIAGNOSIS — R531 Weakness: Secondary | ICD-10-CM | POA: Diagnosis not present

## 2017-02-06 DIAGNOSIS — C649 Malignant neoplasm of unspecified kidney, except renal pelvis: Secondary | ICD-10-CM

## 2017-02-06 DIAGNOSIS — C642 Malignant neoplasm of left kidney, except renal pelvis: Secondary | ICD-10-CM

## 2017-02-06 DIAGNOSIS — M6281 Muscle weakness (generalized): Secondary | ICD-10-CM | POA: Diagnosis not present

## 2017-02-06 DIAGNOSIS — C7951 Secondary malignant neoplasm of bone: Secondary | ICD-10-CM | POA: Diagnosis not present

## 2017-02-06 DIAGNOSIS — Z95828 Presence of other vascular implants and grafts: Secondary | ICD-10-CM

## 2017-02-06 LAB — COMPREHENSIVE METABOLIC PANEL
ALT: 24 U/L (ref 0–55)
AST: 16 U/L (ref 5–34)
Albumin: 4 g/dL (ref 3.5–5.0)
Alkaline Phosphatase: 84 U/L (ref 40–150)
Anion Gap: 11 mEq/L (ref 3–11)
BUN: 14.6 mg/dL (ref 7.0–26.0)
CO2: 24 mEq/L (ref 22–29)
Calcium: 9.6 mg/dL (ref 8.4–10.4)
Chloride: 103 mEq/L (ref 98–109)
Creatinine: 0.8 mg/dL (ref 0.7–1.3)
EGFR: >60 mL/min/{1.73_m2} (ref >60)
Glucose: 111 mg/dl (ref 70–140)
Potassium: 4.5 mEq/L (ref 3.5–5.1)
Sodium: 137 mEq/L (ref 136–145)
Total Bilirubin: 0.23 mg/dL (ref 0.20–1.20)
Total Protein: 7.2 g/dL (ref 6.4–8.3)

## 2017-02-06 LAB — CBC WITH DIFFERENTIAL/PLATELET
BASO%: 1.1 % (ref 0.0–2.0)
Basophils Absolute: 0.1 10*3/uL (ref 0.0–0.1)
EOS%: 18.4 % — ABNORMAL HIGH (ref 0.0–7.0)
Eosinophils Absolute: 1.9 10*3/uL — ABNORMAL HIGH (ref 0.0–0.5)
HCT: 40.3 % (ref 38.4–49.9)
HGB: 13.3 g/dL (ref 13.0–17.1)
LYMPH%: 17.5 % (ref 14.0–49.0)
MCH: 27.9 pg (ref 27.2–33.4)
MCHC: 33.1 g/dL (ref 32.0–36.0)
MCV: 84.1 fL (ref 79.3–98.0)
MONO#: 0.8 10*3/uL (ref 0.1–0.9)
MONO%: 8.2 % (ref 0.0–14.0)
NEUT#: 5.5 10*3/uL (ref 1.5–6.5)
NEUT%: 54.8 % (ref 39.0–75.0)
Platelets: 237 10*3/uL (ref 140–400)
RBC: 4.79 10*6/uL (ref 4.20–5.82)
RDW: 14.3 % (ref 11.0–14.6)
WBC: 10.1 10*3/uL (ref 4.0–10.3)
lymph#: 1.8 10*3/uL (ref 0.9–3.3)

## 2017-02-06 MED ORDER — HEPARIN SOD (PORK) LOCK FLUSH 100 UNIT/ML IV SOLN
500.0000 [IU] | Freq: Once | INTRAVENOUS | Status: AC
  Administered 2017-02-06: 500 [IU]
  Filled 2017-02-06: qty 5

## 2017-02-06 MED ORDER — TESTOSTERONE 50 MG/5GM (1%) TD GEL
7.5000 g | Freq: Every day | TRANSDERMAL | 1 refills | Status: DC
Start: 2017-02-06 — End: 2017-03-08

## 2017-02-06 MED ORDER — SODIUM CHLORIDE 0.9% FLUSH
10.0000 mL | Freq: Once | INTRAVENOUS | Status: AC
Start: 2017-02-06 — End: 2017-02-06
  Administered 2017-02-06: 10 mL
  Filled 2017-02-06: qty 10

## 2017-02-06 NOTE — Telephone Encounter (Signed)
Scheduled appt per 12/12 los- Gave patient aVS and calender per los.

## 2017-02-06 NOTE — Progress Notes (Addendum)
[facility] OFFICE PROGRESS NOTE   Diagnosis: Renal cell carcinoma  INTERVAL HISTORY:   Mr. Stillinger returns as scheduled.  He began cabozantinib 01/28/2017.  No rash.  No diarrhea.  No hand or foot pain or redness.  For the past week beginning around 7 PM and lasting for 1 hour he has noted a "burning sensation" beginning at the level of his chest and extending to his feet.  There is no associated rash/skin discoloration.  Pain overall continues to be improved.  He takes OxyContin with Tylenol/ibuprofen for breakthrough pain.  About 3 days after receiving Zometa his wife noticed that he was more alert, was sleeping less and more conversant.  Objective:  Vital signs in last 24 hours:  Blood pressure 133/61, pulse 70, temperature 98.9 F (37.2 C), temperature source Oral, resp. rate 20, height 5\' 10"  (1.778 m), weight 258 lb 11.2 oz (117.3 kg), SpO2 97 %.    HEENT: No thrush or ulcers. Resp: Lungs clear bilaterally. Cardio: Regular rate and rhythm. GI: Abdomen soft and nontender.  No hepatomegaly. Vascular: No leg edema. Neuro: Alert and oriented. Skin: No rash.  Palms without erythema. Port-A-Cath without erythema.   Lab Results:  Lab Results  Component Value Date   WBC 10.1 02/06/2017   HGB 13.3 02/06/2017   HCT 40.3 02/06/2017   MCV 84.1 02/06/2017   PLT 237 02/06/2017   NEUTROABS 5.5 02/06/2017    Imaging:  No results found.  Medications: I have reviewed the patient's current medications.  Assessment/Plan: 1. Metastatic renal cell carcinoma   L4 mass with extraosseous extension, L4 nerve compression  Biopsy of the L4 mass 11/18/2015 confirmed metastatic renal cell carcinoma, clear cell type  CTs of the chest, abdomen, and pelvis 11/18/2015-right lower lobe nodule, expansile lytic lesion at the right 11th rib/costal vertebral junction, left renal mass, expansile lesion involving the L4 vertebra, lytic lesion at the left acetabulum, and a  low-attenuation liver lesion  Initiation of SRS to L4 12/02/2015, Completed 12/12/2015  Initiation of Pazopanib11/04/2015  Pazopanibplaced on hold 02/06/2016 secondary to elevated liver enzymes  Pazopanibresumed 03/07/2016 at a dose of 400 mg daily  Pazopanibdiscontinued 03/19/2016 secondary to elevated liver enzymes  Restaging CTs 04/02/2016-stable left renal mass, decreased soft tissue component associated with the L4 metastasis, increased soft tissue component associated with the right 11th rib metastasis with increased T11 bony destruction, increased sclerosis at the left acetabulum lesion  Cycle 1 nivolumab 04/12/2016  Cycle 2 nivolumab 04/26/2016  Cycle 3 nivolumab03/16/2018  Cycle 4 nivolumab 05/24/2016  Cycle 5 nivolumab 06/08/2016  MRI lumbar spine 06/21/2016-unchanged tumor at L3, increased size of retroperitoneal lymph nodes compared to a CT from 04/02/2016  Cycle 6 nivolumab 06/22/2016  CTs chest, abdomen, and pelvis 07/04/2016-enlargement of the left renal mass, right adrenal nodule, left hilar and peritoneal lymph nodes, enlargement of left acetabular lesion. Stable lung nodules.  Cycle 7 nivolumab05/12/2016   Cycle 8 nivolumab 07/20/2016  Cycle 9 nivolumab 08/02/2016  Cycle 10 nivolumab 08/20/2016  Restaging CT 09/03/2016 evaluation with stable disease  Cycle 11 nivolumab 09/05/2016  Cycle 12 nivolumab 09/19/2016  Cycle 13 nivolumab 10/03/2016  Cycle 14 nivolumab 10/17/2016  Cycle 15 nivolumab 10/31/2016  Cycle 16 nivolumab 11/14/2016 (changed to monthly schedule)  Cycle 17nivolumab10/24/2018  CTs 01/21/2017-increased left renal mass, increased size of adrenal metastases, increased lytic bone lesions, increased left lung nodule, persistent tumor at L4 with probable epidural component  Initiation of Cabozantinib 01/28/2017 2. Pain secondary to #1-managed by Dr. Lovenia Shuck. 3. Hypertension  4. Elevated transaminases  02/06/2016-Pazopanibplaced on hold  Liver enzymes normal 03/07/2016 5. Port-A-Cath placement 05/16/2016 6. Malaise/anorexia 09/05/2016. Cortisol and testosterone levels low. Hydrocortisone and testosterone replacement initiated. 7. Conjunctival/scleral erythema 09/19/2016-resolved with steroid eyedrops 8. 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 9. Hypercalcemia status post Zometa 01/23/2017  Disposition: Mr. Kluger appears stable.  He will continue cabozantinib.  We reviewed today's labs.  The calcium is now in normal range.  The plan is for Zometa every 3 months for bone strength/sooner if needed for hypercalcemia.  He will return for labs in 2 weeks, labs and follow-up in 4 weeks.  He will contact the office in the interim with any problems.  Patient seen with Dr. [name].    [name] ANP/GNP-BC   02/06/2017  1:21 PM This was a shared visit with [name].  Mr. Sliter has an improved performance status after receiving Zometa and starting cabozantinib.  He appears to be tolerating the cabozantinib well.  He will return for lab visit in 2 weeks and an office visit in 4 weeks.  Julieanne Manson, MD

## 2017-02-08 DIAGNOSIS — M5416 Radiculopathy, lumbar region: Secondary | ICD-10-CM | POA: Diagnosis not present

## 2017-02-08 DIAGNOSIS — C7951 Secondary malignant neoplasm of bone: Secondary | ICD-10-CM | POA: Diagnosis not present

## 2017-02-11 DIAGNOSIS — M6281 Muscle weakness (generalized): Secondary | ICD-10-CM | POA: Diagnosis not present

## 2017-02-11 DIAGNOSIS — R531 Weakness: Secondary | ICD-10-CM | POA: Diagnosis not present

## 2017-02-14 ENCOUNTER — Other Ambulatory Visit: Payer: Self-pay | Admitting: Oncology

## 2017-02-14 DIAGNOSIS — C649 Malignant neoplasm of unspecified kidney, except renal pelvis: Secondary | ICD-10-CM

## 2017-02-14 DIAGNOSIS — R531 Weakness: Secondary | ICD-10-CM | POA: Diagnosis not present

## 2017-02-14 DIAGNOSIS — M6281 Muscle weakness (generalized): Secondary | ICD-10-CM | POA: Diagnosis not present

## 2017-02-17 ENCOUNTER — Other Ambulatory Visit: Payer: Self-pay | Admitting: Oncology

## 2017-02-21 ENCOUNTER — Ambulatory Visit (HOSPITAL_BASED_OUTPATIENT_CLINIC_OR_DEPARTMENT_OTHER): Payer: PPO

## 2017-02-21 ENCOUNTER — Other Ambulatory Visit (HOSPITAL_BASED_OUTPATIENT_CLINIC_OR_DEPARTMENT_OTHER): Payer: PPO

## 2017-02-21 DIAGNOSIS — Z79899 Other long term (current) drug therapy: Secondary | ICD-10-CM

## 2017-02-21 DIAGNOSIS — C642 Malignant neoplasm of left kidney, except renal pelvis: Secondary | ICD-10-CM | POA: Diagnosis not present

## 2017-02-21 DIAGNOSIS — R531 Weakness: Secondary | ICD-10-CM | POA: Diagnosis not present

## 2017-02-21 DIAGNOSIS — C649 Malignant neoplasm of unspecified kidney, except renal pelvis: Secondary | ICD-10-CM

## 2017-02-21 DIAGNOSIS — Z95828 Presence of other vascular implants and grafts: Secondary | ICD-10-CM

## 2017-02-21 DIAGNOSIS — M6281 Muscle weakness (generalized): Secondary | ICD-10-CM | POA: Diagnosis not present

## 2017-02-21 LAB — CBC WITH DIFFERENTIAL/PLATELET
BASO%: 0.7 % (ref 0.0–2.0)
Basophils Absolute: 0.1 10*3/uL (ref 0.0–0.1)
EOS%: 11.2 % — ABNORMAL HIGH (ref 0.0–7.0)
Eosinophils Absolute: 1.1 10*3/uL — ABNORMAL HIGH (ref 0.0–0.5)
HCT: 44.5 % (ref 38.4–49.9)
HGB: 14.7 g/dL (ref 13.0–17.1)
LYMPH%: 19.6 % (ref 14.0–49.0)
MCH: 28.1 pg (ref 27.2–33.4)
MCHC: 33.2 g/dL (ref 32.0–36.0)
MCV: 84.8 fL (ref 79.3–98.0)
MONO#: 0.6 10*3/uL (ref 0.1–0.9)
MONO%: 5.7 % (ref 0.0–14.0)
NEUT#: 6 10*3/uL (ref 1.5–6.5)
NEUT%: 62.8 % (ref 39.0–75.0)
Platelets: 183 10*3/uL (ref 140–400)
RBC: 5.24 10*6/uL (ref 4.20–5.82)
RDW: 15.4 % — ABNORMAL HIGH (ref 11.0–14.6)
WBC: 9.6 10*3/uL (ref 4.0–10.3)
lymph#: 1.9 10*3/uL (ref 0.9–3.3)

## 2017-02-21 LAB — COMPREHENSIVE METABOLIC PANEL
ALT: 43 U/L (ref 0–55)
AST: 20 U/L (ref 5–34)
Albumin: 4.2 g/dL (ref 3.5–5.0)
Alkaline Phosphatase: 104 U/L (ref 40–150)
Anion Gap: 10 mEq/L (ref 3–11)
BUN: 16.6 mg/dL (ref 7.0–26.0)
CO2: 26 mEq/L (ref 22–29)
Calcium: 9.4 mg/dL (ref 8.4–10.4)
Chloride: 103 mEq/L (ref 98–109)
Creatinine: 0.8 mg/dL (ref 0.7–1.3)
EGFR: >60 mL/min/{1.73_m2} (ref >60)
Glucose: 115 mg/dl (ref 70–140)
Potassium: 4.3 mEq/L (ref 3.5–5.1)
Sodium: 140 mEq/L (ref 136–145)
Total Bilirubin: 0.36 mg/dL (ref 0.20–1.20)
Total Protein: 7.5 g/dL (ref 6.4–8.3)

## 2017-02-21 LAB — UA PROTEIN, DIPSTICK - CHCC: Protein, ur: NEGATIVE mg/dL

## 2017-02-21 MED ORDER — HEPARIN SOD (PORK) LOCK FLUSH 100 UNIT/ML IV SOLN
500.0000 [IU] | Freq: Once | INTRAVENOUS | Status: DC
  Filled 2017-02-21: qty 5

## 2017-02-21 MED ORDER — SODIUM CHLORIDE 0.9% FLUSH
10.0000 mL | Freq: Once | INTRAVENOUS | Status: AC
  Administered 2017-02-21: 10 mL
  Filled 2017-02-21: qty 10

## 2017-02-21 MED FILL — CABOMETYX 40 MG TABLET: 40 | 30 days supply | Qty: 30 | Fill #0

## 2017-03-01 DIAGNOSIS — M6281 Muscle weakness (generalized): Secondary | ICD-10-CM | POA: Diagnosis not present

## 2017-03-01 DIAGNOSIS — R531 Weakness: Secondary | ICD-10-CM | POA: Diagnosis not present

## 2017-03-04 DIAGNOSIS — M6281 Muscle weakness (generalized): Secondary | ICD-10-CM | POA: Diagnosis not present

## 2017-03-04 DIAGNOSIS — R531 Weakness: Secondary | ICD-10-CM | POA: Diagnosis not present

## 2017-03-06 DIAGNOSIS — R531 Weakness: Secondary | ICD-10-CM | POA: Diagnosis not present

## 2017-03-06 DIAGNOSIS — M6281 Muscle weakness (generalized): Secondary | ICD-10-CM | POA: Diagnosis not present

## 2017-03-08 ENCOUNTER — Inpatient Hospital Stay: Payer: PPO | Attending: Nurse Practitioner | Admitting: Nurse Practitioner

## 2017-03-08 ENCOUNTER — Inpatient Hospital Stay: Payer: PPO

## 2017-03-08 ENCOUNTER — Telehealth: Payer: Self-pay | Admitting: Nurse Practitioner

## 2017-03-08 ENCOUNTER — Other Ambulatory Visit: Payer: Self-pay

## 2017-03-08 VITALS — BP 155/70 | HR 51 | Temp 98.7°F | Resp 16 | Wt 262.0 lb

## 2017-03-08 DIAGNOSIS — C649 Malignant neoplasm of unspecified kidney, except renal pelvis: Secondary | ICD-10-CM

## 2017-03-08 DIAGNOSIS — R21 Rash and other nonspecific skin eruption: Secondary | ICD-10-CM | POA: Diagnosis not present

## 2017-03-08 DIAGNOSIS — C7951 Secondary malignant neoplasm of bone: Secondary | ICD-10-CM | POA: Diagnosis not present

## 2017-03-08 DIAGNOSIS — C642 Malignant neoplasm of left kidney, except renal pelvis: Secondary | ICD-10-CM | POA: Diagnosis not present

## 2017-03-08 DIAGNOSIS — Z95828 Presence of other vascular implants and grafts: Secondary | ICD-10-CM

## 2017-03-08 LAB — COMPREHENSIVE METABOLIC PANEL
ALT: 39 U/L (ref 0–55)
AST: 17 U/L (ref 5–34)
Albumin: 4.1 g/dL (ref 3.5–5.0)
Alkaline Phosphatase: 90 U/L (ref 40–150)
Anion gap: 8 (ref 3–11)
BUN: 19 mg/dL (ref 7–26)
CO2: 28 mmol/L (ref 22–29)
Calcium: 9.5 mg/dL (ref 8.4–10.4)
Chloride: 103 mmol/L (ref 98–109)
Creatinine, Ser: 0.92 mg/dL (ref 0.70–1.30)
GFR calc Af Amer: >60 mL/min (ref >60)
GFR calc non Af Amer: >60 mL/min (ref >60)
Glucose, Bld: 105 mg/dL (ref 70–140)
Potassium: 4.4 mmol/L (ref 3.5–5.1)
Sodium: 139 mmol/L (ref 136–145)
Total Bilirubin: 0.3 mg/dL (ref 0.2–1.2)
Total Protein: 7.4 g/dL (ref 6.4–8.3)

## 2017-03-08 LAB — CBC
HCT: 45.5 % (ref 38.4–49.9)
Hemoglobin: 15 g/dL (ref 13.0–17.1)
MCH: 28 pg (ref 27.2–33.4)
MCHC: 33 g/dL (ref 32.0–36.0)
MCV: 84.9 fL (ref 79.3–98.0)
Platelets: 170 10*3/uL (ref 140–400)
RBC: 5.36 MIL/uL (ref 4.20–5.82)
RDW: 16.1 % — ABNORMAL HIGH (ref 11.0–15.6)
WBC: 7.3 10*3/uL (ref 4.0–10.3)

## 2017-03-08 MED ORDER — SODIUM CHLORIDE 0.9% FLUSH
10.0000 mL | Freq: Once | INTRAVENOUS | Status: AC
  Administered 2017-03-08: 10 mL
  Filled 2017-03-08: qty 10

## 2017-03-08 MED ORDER — HEPARIN SOD (PORK) LOCK FLUSH 100 UNIT/ML IV SOLN
500.0000 [IU] | Freq: Once | INTRAVENOUS | Status: AC
  Administered 2017-03-08: 500 [IU]
  Filled 2017-03-08: qty 5

## 2017-03-08 MED ORDER — TESTOSTERONE 50 MG/5GM (1%) TD GEL: 7.5000 g | Freq: Every day | TRANSDERMAL | 1 refills | Status: DC

## 2017-03-08 NOTE — Telephone Encounter (Signed)
Scheduled appt per 1/11 los - Patient aware of appts - no calender or AVS printed.

## 2017-03-08 NOTE — Progress Notes (Addendum)
[place] OFFICE PROGRESS NOTE   Diagnosis: Renal cell carcinoma  INTERVAL HISTORY:   Phillip [name] returns as scheduled.  He continues cabozantinib.  He is feeling much stronger.  He continues physical therapy twice a week.  No nausea or vomiting.  No mouth sores.  No diarrhea.  He has noted a mild rash along a neck skin fold, no associated pruritus.  He has also noted an acne type rash on his chest.  Pain is well controlled on OxyContin every 12 hours.  He is no longer taking oxycodone.  Objective:  Vital signs in last 24 hours:  Blood pressure (!) 155/70, pulse (!) 51, temperature 98.7 F (37.1 C), resp. rate 16, weight 262 lb (118.8 kg), SpO2 97 %.    HEENT: No thrush or ulcers. Resp: Lungs clear bilaterally. Cardio: Regular rate and rhythm. GI: Abdomen soft and nontender.  No hepatomegaly. Vascular: No leg edema. Skin: Acne type rash scattered over the anterior chest/upper abdomen.  Mildly erythematous dry appearing rash neck skin fold. Port-A-Cath without erythema.   Lab Results:  Lab Results  Component Value Date   WBC 7.3 03/08/2017   HGB 15.0 03/08/2017   HCT 45.5 03/08/2017   MCV 84.9 03/08/2017   PLT 170 03/08/2017   NEUTROABS 6.0 02/21/2017    Imaging:  No results found.  Medications: I have reviewed the patient's current medications.  Assessment/Plan: 1. Metastatic renal cell carcinoma   L4 mass with extraosseous extension, L4 nerve compression  Biopsy of the L4 mass 11/18/2015 confirmed metastatic renal cell carcinoma, clear cell type  CTs of the chest, abdomen, and pelvis 11/18/2015-right lower lobe nodule, expansile lytic lesion at the right 11th rib/costal vertebral junction, left renal mass, expansile lesion involving the L4 vertebra, lytic lesion at the left acetabulum, and a low-attenuation liver lesion  Initiation of SRS to L4 12/02/2015, Completed 12/12/2015  Initiation of Pazopanib11/04/2015  Pazopanibplaced on hold  02/06/2016 secondary to elevated liver enzymes  Pazopanibresumed 03/07/2016 at a dose of 400 mg daily  Pazopanibdiscontinued 03/19/2016 secondary to elevated liver enzymes  Restaging CTs 04/02/2016-stable left renal mass, decreased soft tissue component associated with the L4 metastasis, increased soft tissue component associated with the right 11th rib metastasis with increased T11 bony destruction, increased sclerosis at the left acetabulum lesion  Cycle 1 nivolumab 04/12/2016  Cycle 2 nivolumab 04/26/2016  Cycle 3 nivolumab03/16/2018  Cycle 4 nivolumab 05/24/2016  Cycle 5 nivolumab 06/08/2016  MRI lumbar spine 06/21/2016-unchanged tumor at L3, increased size of retroperitoneal lymph nodes compared to a CT from 04/02/2016  Cycle 6 nivolumab 06/22/2016  CTs chest, abdomen, and pelvis 07/04/2016-enlargement of the left renal mass, right adrenal nodule, left hilar and peritoneal lymph nodes, enlargement of left acetabular lesion. Stable lung nodules.  Cycle 7 nivolumab05/12/2016   Cycle 8 nivolumab 07/20/2016  Cycle 9 nivolumab 08/02/2016  Cycle 10 nivolumab 08/20/2016  Restaging CT 09/03/2016 evaluation with stable disease  Cycle 11 nivolumab 09/05/2016  Cycle 12 nivolumab 09/19/2016  Cycle 13 nivolumab 10/03/2016  Cycle 14 nivolumab 10/17/2016  Cycle 15 nivolumab 10/31/2016  Cycle 16 nivolumab 11/14/2016 (changed to monthly schedule)  Cycle 17nivolumab10/24/2018  CTs 01/21/2017-increased left renal mass, increased size of adrenal metastases, increased lytic bone lesions, increased left lung nodule, persistent tumor at L4 with probable epidural component  Initiation of Cabozantinib 01/28/2017 2. Pain secondary to #1-managed by Dr. Lovenia Shuck. 3. Hypertension 4. Elevated transaminases 02/06/2016-Pazopanibplaced on hold  Liver enzymes normal 03/07/2016 5. Port-A-Cath placement 05/16/2016 6. Malaise/anorexia 09/05/2016. Cortisol and testosterone  levels low.  Hydrocortisone and testosterone replacement initiated. 7. Conjunctival/scleral erythema 09/19/2016-resolved with steroid eyedrops 8. 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 9. Hypercalcemia status post Zometa 01/23/2017     Disposition: Mr. Kowalewski appears well.  His performance status is markedly improved.  He will continue cabozantinib.  The acne appearing rash on the chest and upper abdomen may be related to testosterone.  He will return for labs in 3 weeks, follow-up visit with labs in 6 weeks.  He will contact the office in the interim with any problems.  Patient seen with Dr. [name].    [name] ANP/GNP-BC   03/08/2017  12:36 PM  This was a shared visit with [name].  Mr. Delduca has an improved performance status and the calcium has normalized.  He is tolerating the cabozantinib well.  The rash at the upper chest may be related to cabozantinib versus testosterone replacement.  He will continue the current treatment.  He will return for a lab visit in 3 weeks.  Julieanne Manson, MD

## 2017-03-12 DIAGNOSIS — R531 Weakness: Secondary | ICD-10-CM | POA: Diagnosis not present

## 2017-03-12 DIAGNOSIS — M6281 Muscle weakness (generalized): Secondary | ICD-10-CM | POA: Diagnosis not present

## 2017-03-18 ENCOUNTER — Other Ambulatory Visit: Payer: Self-pay | Admitting: Oncology

## 2017-03-18 DIAGNOSIS — R531 Weakness: Secondary | ICD-10-CM | POA: Diagnosis not present

## 2017-03-18 DIAGNOSIS — C649 Malignant neoplasm of unspecified kidney, except renal pelvis: Secondary | ICD-10-CM

## 2017-03-18 DIAGNOSIS — M6281 Muscle weakness (generalized): Secondary | ICD-10-CM | POA: Diagnosis not present

## 2017-03-21 DIAGNOSIS — M6281 Muscle weakness (generalized): Secondary | ICD-10-CM | POA: Diagnosis not present

## 2017-03-21 DIAGNOSIS — R531 Weakness: Secondary | ICD-10-CM | POA: Diagnosis not present

## 2017-03-25 DIAGNOSIS — R531 Weakness: Secondary | ICD-10-CM | POA: Diagnosis not present

## 2017-03-25 DIAGNOSIS — M6281 Muscle weakness (generalized): Secondary | ICD-10-CM | POA: Diagnosis not present

## 2017-03-25 MED FILL — CABOMETYX 40 MG TABLET: 40 | 30 days supply | Qty: 30 | Fill #0

## 2017-03-28 DIAGNOSIS — M6281 Muscle weakness (generalized): Secondary | ICD-10-CM | POA: Diagnosis not present

## 2017-03-28 DIAGNOSIS — R531 Weakness: Secondary | ICD-10-CM | POA: Diagnosis not present

## 2017-03-29 ENCOUNTER — Inpatient Hospital Stay: Payer: PPO

## 2017-03-29 ENCOUNTER — Inpatient Hospital Stay: Payer: PPO | Attending: Oncology

## 2017-03-29 DIAGNOSIS — C7951 Secondary malignant neoplasm of bone: Secondary | ICD-10-CM | POA: Insufficient documentation

## 2017-03-29 DIAGNOSIS — R7989 Other specified abnormal findings of blood chemistry: Secondary | ICD-10-CM | POA: Diagnosis not present

## 2017-03-29 DIAGNOSIS — Z95828 Presence of other vascular implants and grafts: Secondary | ICD-10-CM

## 2017-03-29 DIAGNOSIS — Z452 Encounter for adjustment and management of vascular access device: Secondary | ICD-10-CM | POA: Diagnosis not present

## 2017-03-29 DIAGNOSIS — C649 Malignant neoplasm of unspecified kidney, except renal pelvis: Secondary | ICD-10-CM

## 2017-03-29 DIAGNOSIS — I10 Essential (primary) hypertension: Secondary | ICD-10-CM | POA: Insufficient documentation

## 2017-03-29 DIAGNOSIS — Z9221 Personal history of antineoplastic chemotherapy: Secondary | ICD-10-CM | POA: Diagnosis not present

## 2017-03-29 LAB — CBC WITH DIFFERENTIAL (CANCER CENTER ONLY)
Basophils Absolute: 0.1 10*3/uL (ref 0.0–0.1)
Basophils Relative: 1 %
Eosinophils Absolute: 0.8 10*3/uL — ABNORMAL HIGH (ref 0.0–0.5)
Eosinophils Relative: 10 %
HCT: 45 % (ref 38.4–49.9)
Hemoglobin: 14.9 g/dL (ref 13.0–17.1)
Lymphocytes Relative: 26 %
Lymphs Abs: 2.2 10*3/uL (ref 0.9–3.3)
MCH: 28.8 pg (ref 27.2–33.4)
MCHC: 33.1 g/dL (ref 32.0–36.0)
MCV: 87 fL (ref 79.3–98.0)
Monocytes Absolute: 0.5 10*3/uL (ref 0.1–0.9)
Monocytes Relative: 6 %
Neutro Abs: 5.1 10*3/uL (ref 1.5–6.5)
Neutrophils Relative %: 57 %
Platelet Count: 246 10*3/uL (ref 140–400)
RBC: 5.17 MIL/uL (ref 4.20–5.82)
RDW: 15.9 % — ABNORMAL HIGH (ref 11.0–14.6)
WBC Count: 8.7 10*3/uL (ref 4.0–10.3)

## 2017-03-29 LAB — CMP (CANCER CENTER ONLY)
ALT: 81 U/L — ABNORMAL HIGH (ref 0–55)
AST: 32 U/L (ref 5–34)
Albumin: 3.9 g/dL (ref 3.5–5.0)
Alkaline Phosphatase: 104 U/L (ref 40–150)
Anion gap: 11 (ref 3–11)
BUN: 18 mg/dL (ref 7–26)
CO2: 27 mmol/L (ref 22–29)
Calcium: 9.7 mg/dL (ref 8.4–10.4)
Chloride: 102 mmol/L (ref 98–109)
Creatinine: 1.01 mg/dL (ref 0.70–1.30)
GFR, Est AFR Am: >60 mL/min (ref >60)
GFR, Estimated: >60 mL/min (ref >60)
Glucose, Bld: 142 mg/dL — ABNORMAL HIGH (ref 70–140)
Potassium: 4.1 mmol/L (ref 3.5–5.1)
Sodium: 140 mmol/L (ref 136–145)
Total Bilirubin: 0.3 mg/dL (ref 0.2–1.2)
Total Protein: 7.6 g/dL (ref 6.4–8.3)

## 2017-03-29 LAB — UA PROTEIN, DIPSTICK - CHCC: Protein, ur: NEGATIVE mg/dL

## 2017-03-29 MED ORDER — SODIUM CHLORIDE 0.9% FLUSH
10.0000 mL | Freq: Once | INTRAVENOUS | Status: AC
  Administered 2017-03-29: 10 mL
  Filled 2017-03-29: qty 10

## 2017-03-29 MED ORDER — HEPARIN SOD (PORK) LOCK FLUSH 100 UNIT/ML IV SOLN
500.0000 [IU] | Freq: Once | INTRAVENOUS | Status: AC
  Administered 2017-03-29: 500 [IU]
  Filled 2017-03-29: qty 5

## 2017-04-01 DIAGNOSIS — R531 Weakness: Secondary | ICD-10-CM | POA: Diagnosis not present

## 2017-04-01 DIAGNOSIS — M6281 Muscle weakness (generalized): Secondary | ICD-10-CM | POA: Diagnosis not present

## 2017-04-04 DIAGNOSIS — M6281 Muscle weakness (generalized): Secondary | ICD-10-CM | POA: Diagnosis not present

## 2017-04-04 DIAGNOSIS — R531 Weakness: Secondary | ICD-10-CM | POA: Diagnosis not present

## 2017-04-08 ENCOUNTER — Other Ambulatory Visit: Payer: Self-pay | Admitting: Emergency Medicine

## 2017-04-08 DIAGNOSIS — M6281 Muscle weakness (generalized): Secondary | ICD-10-CM | POA: Diagnosis not present

## 2017-04-08 DIAGNOSIS — R531 Weakness: Secondary | ICD-10-CM | POA: Diagnosis not present

## 2017-04-08 DIAGNOSIS — C649 Malignant neoplasm of unspecified kidney, except renal pelvis: Secondary | ICD-10-CM

## 2017-04-08 MED ORDER — TESTOSTERONE 50 MG/5GM (1%) TD GEL: 7.5000 g | Freq: Every day | TRANSDERMAL | 1 refills | Status: DC

## 2017-04-11 DIAGNOSIS — M6281 Muscle weakness (generalized): Secondary | ICD-10-CM | POA: Diagnosis not present

## 2017-04-11 DIAGNOSIS — R531 Weakness: Secondary | ICD-10-CM | POA: Diagnosis not present

## 2017-04-15 ENCOUNTER — Other Ambulatory Visit: Payer: Self-pay | Admitting: Oncology

## 2017-04-15 DIAGNOSIS — R531 Weakness: Secondary | ICD-10-CM | POA: Diagnosis not present

## 2017-04-15 DIAGNOSIS — C649 Malignant neoplasm of unspecified kidney, except renal pelvis: Secondary | ICD-10-CM

## 2017-04-15 DIAGNOSIS — M6281 Muscle weakness (generalized): Secondary | ICD-10-CM | POA: Diagnosis not present

## 2017-04-18 DIAGNOSIS — R531 Weakness: Secondary | ICD-10-CM | POA: Diagnosis not present

## 2017-04-18 DIAGNOSIS — M6281 Muscle weakness (generalized): Secondary | ICD-10-CM | POA: Diagnosis not present

## 2017-04-19 ENCOUNTER — Telehealth: Payer: Self-pay

## 2017-04-19 ENCOUNTER — Inpatient Hospital Stay (HOSPITAL_BASED_OUTPATIENT_CLINIC_OR_DEPARTMENT_OTHER): Payer: PPO | Admitting: Oncology

## 2017-04-19 ENCOUNTER — Inpatient Hospital Stay: Payer: PPO

## 2017-04-19 VITALS — BP 160/71 | HR 59 | Temp 98.6°F | Resp 20 | Ht 70.0 in | Wt 267.0 lb

## 2017-04-19 DIAGNOSIS — C649 Malignant neoplasm of unspecified kidney, except renal pelvis: Secondary | ICD-10-CM

## 2017-04-19 DIAGNOSIS — C7951 Secondary malignant neoplasm of bone: Secondary | ICD-10-CM

## 2017-04-19 DIAGNOSIS — I10 Essential (primary) hypertension: Secondary | ICD-10-CM

## 2017-04-19 DIAGNOSIS — R7989 Other specified abnormal findings of blood chemistry: Secondary | ICD-10-CM | POA: Diagnosis not present

## 2017-04-19 DIAGNOSIS — Z9221 Personal history of antineoplastic chemotherapy: Secondary | ICD-10-CM

## 2017-04-19 DIAGNOSIS — Z95828 Presence of other vascular implants and grafts: Secondary | ICD-10-CM

## 2017-04-19 LAB — CMP (CANCER CENTER ONLY)
ALT: 104 U/L — ABNORMAL HIGH (ref 0–55)
AST: 38 U/L — ABNORMAL HIGH (ref 5–34)
Albumin: 3.9 g/dL (ref 3.5–5.0)
Alkaline Phosphatase: 92 U/L (ref 40–150)
Anion gap: 10 (ref 3–11)
BUN: 14 mg/dL (ref 7–26)
CO2: 28 mmol/L (ref 22–29)
Calcium: 9.8 mg/dL (ref 8.4–10.4)
Chloride: 101 mmol/L (ref 98–109)
Creatinine: 0.86 mg/dL (ref 0.70–1.30)
GFR, Est AFR Am: >60 mL/min (ref >60)
GFR, Estimated: >60 mL/min (ref >60)
Glucose, Bld: 117 mg/dL (ref 70–140)
Potassium: 4.4 mmol/L (ref 3.5–5.1)
Sodium: 139 mmol/L (ref 136–145)
Total Bilirubin: 0.4 mg/dL (ref 0.2–1.2)
Total Protein: 7.2 g/dL (ref 6.4–8.3)

## 2017-04-19 LAB — CBC WITH DIFFERENTIAL (CANCER CENTER ONLY)
Basophils Absolute: 0 10*3/uL (ref 0.0–0.1)
Basophils Relative: 1 %
Eosinophils Absolute: 1.8 10*3/uL — ABNORMAL HIGH (ref 0.0–0.5)
Eosinophils Relative: 21 %
HCT: 44.3 % (ref 38.4–49.9)
Hemoglobin: 14.9 g/dL (ref 13.0–17.1)
Lymphocytes Relative: 24 %
Lymphs Abs: 2.1 10*3/uL (ref 0.9–3.3)
MCH: 29.5 pg (ref 27.2–33.4)
MCHC: 33.6 g/dL (ref 32.0–36.0)
MCV: 87.7 fL (ref 79.3–98.0)
Monocytes Absolute: 0.5 10*3/uL (ref 0.1–0.9)
Monocytes Relative: 6 %
Neutro Abs: 4.1 10*3/uL (ref 1.5–6.5)
Neutrophils Relative %: 48 %
Platelet Count: 150 10*3/uL (ref 140–400)
RBC: 5.05 MIL/uL (ref 4.20–5.82)
RDW: 17.1 % — ABNORMAL HIGH (ref 11.0–14.6)
WBC Count: 8.5 10*3/uL (ref 4.0–10.3)

## 2017-04-19 LAB — TOTAL PROTEIN, URINE DIPSTICK: Protein, ur: <30 mg/dL — AB

## 2017-04-19 MED ORDER — SODIUM CHLORIDE 0.9% FLUSH
10.0000 mL | Freq: Once | INTRAVENOUS | Status: DC
  Filled 2017-04-19: qty 10

## 2017-04-19 MED ORDER — HEPARIN SOD (PORK) LOCK FLUSH 100 UNIT/ML IV SOLN
500.0000 [IU] | Freq: Once | INTRAVENOUS | Status: DC
Start: 2017-04-19 — End: 2017-04-19
  Filled 2017-04-19: qty 5

## 2017-04-19 MED FILL — OxyCONTIN 20 MG T12A: 20 | 30 days supply | Qty: 60 | Fill #0

## 2017-04-19 MED FILL — CABOMETYX 40 MG TABLET: 40 | 30 days supply | Qty: 30 | Fill #0

## 2017-04-19 NOTE — Progress Notes (Signed)
[facility] OFFICE PROGRESS NOTE   Diagnosis: Renal cell carcinoma  INTERVAL HISTORY:   Phillip [name] returns as scheduled.  He continues cabozantinib.  He has loose bowel movements, but no frank diarrhea.  The mouth "burns "when he chews gum or drinks an orange drink.  He has no pain at present.  He continues physical therapy.  His ambulation has improved.  Good appetite.  Objective:  Vital signs in last 24 hours:  Blood pressure (!) 160/71, pulse (!) 59, temperature 98.6 F (37 C), temperature source Oral, resp. rate 20, height 5\' 10"  (1.778 m), weight 267 lb (121.1 kg), SpO2 98 %.    HEENT: No ulcers.  Mild thrush at the left buccal mucosas versus leukoplakia Resp: Lungs clear bilaterally Cardio: Regular rhythm with a variable rate, premature beats, and pauses GI: No hepatosplenomegaly Vascular: No leg edema  Skin: Few erythematous papules over the chest and back  Portacath/PICC-without erythema  Lab Results:  Lab Results  Component Value Date   WBC 8.5 04/19/2017   HGB 15.0 03/08/2017   HCT 44.3 04/19/2017   MCV 87.7 04/19/2017   PLT 150 04/19/2017   NEUTROABS 4.1 04/19/2017    CMP     Component Value Date/Time   NA 140 03/29/2017 1512   NA 140 02/21/2017 1128   K 4.1 03/29/2017 1512   K 4.3 02/21/2017 1128   CL 102 03/29/2017 1512   CO2 27 03/29/2017 1512   CO2 26 02/21/2017 1128   GLUCOSE 142 (H) 03/29/2017 1512   GLUCOSE 115 02/21/2017 1128   BUN 18 03/29/2017 1512   BUN 16.6 02/21/2017 1128   CREATININE 1.01 03/29/2017 1512   CREATININE 0.8 02/21/2017 1128   CALCIUM 9.7 03/29/2017 1512   CALCIUM 9.4 02/21/2017 1128   PROT 7.6 03/29/2017 1512   PROT 7.5 02/21/2017 1128   ALBUMIN 3.9 03/29/2017 1512   ALBUMIN 4.2 02/21/2017 1128   AST 32 03/29/2017 1512   AST 20 02/21/2017 1128   ALT 81 (H) 03/29/2017 1512   ALT 43 02/21/2017 1128   ALKPHOS 104 03/29/2017 1512   ALKPHOS 104 02/21/2017 1128   BILITOT 0.3 03/29/2017 1512   BILITOT  0.36 02/21/2017 1128   GFRNONAA >60 03/29/2017 1512   GFRAA >60 03/29/2017 1512    Lab Results  Component Value Date   CEA1 1.77 01/04/2016    Medications: I have reviewed the patient's current medications.   Assessment/Plan: 1. Metastatic renal cell carcinoma   L4 mass with extraosseous extension, L4 nerve compression  Biopsy of the L4 mass 11/18/2015 confirmed metastatic renal cell carcinoma, clear cell type  CTs of the chest, abdomen, and pelvis 11/18/2015-right lower lobe nodule, expansile lytic lesion at the right 11th rib/costal vertebral junction, left renal mass, expansile lesion involving the L4 vertebra, lytic lesion at the left acetabulum, and a low-attenuation liver lesion  Initiation of SRS to L4 12/02/2015, Completed 12/12/2015  Initiation of Pazopanib11/04/2015  Pazopanibplaced on hold 02/06/2016 secondary to elevated liver enzymes  Pazopanibresumed 03/07/2016 at a dose of 400 mg daily  Pazopanibdiscontinued 03/19/2016 secondary to elevated liver enzymes  Restaging CTs 04/02/2016-stable left renal mass, decreased soft tissue component associated with the L4 metastasis, increased soft tissue component associated with the right 11th rib metastasis with increased T11 bony destruction, increased sclerosis at the left acetabulum lesion  Cycle 1 nivolumab 04/12/2016  Cycle 2 nivolumab 04/26/2016  Cycle 3 nivolumab03/16/2018  Cycle 4 nivolumab 05/24/2016  Cycle 5 nivolumab 06/08/2016  MRI lumbar spine 06/21/2016-unchanged tumor  at L3, increased size of retroperitoneal lymph nodes compared to a CT from 04/02/2016  Cycle 6 nivolumab 06/22/2016  CTs chest, abdomen, and pelvis 07/04/2016-enlargement of the left renal mass, right adrenal nodule, left hilar and peritoneal lymph nodes, enlargement of left acetabular lesion. Stable lung nodules.  Cycle 7 nivolumab05/12/2016   Cycle 8 nivolumab 07/20/2016  Cycle 9 nivolumab 08/02/2016  Cycle 10 nivolumab  08/20/2016  Restaging CT 09/03/2016 evaluation with stable disease  Cycle 11 nivolumab 09/05/2016  Cycle 12 nivolumab 09/19/2016  Cycle 13 nivolumab 10/03/2016  Cycle 14 nivolumab 10/17/2016  Cycle 15 nivolumab 10/31/2016  Cycle 16 nivolumab 11/14/2016 (changed to monthly schedule)  Cycle 17nivolumab10/24/2018  CTs 01/21/2017-increased left renal mass, increased size of adrenal metastases, increased lytic bone lesions, increased left lung nodule, persistent tumor at L4 with probable epidural component  Initiation of Cabozantinib12/04/2016 2. Pain secondary to #1-managed by Dr. Lovenia Shuck. 3. Hypertension 4. Elevated transaminases 02/06/2016-Pazopanibplaced on hold  Liver enzymes normal 03/07/2016 5. Port-A-Cath placement 05/16/2016 6. Malaise/anorexia 09/05/2016. Cortisol and testosterone levels low. Hydrocortisone and testosterone replacement initiated. 7. Conjunctival/scleral erythema 09/19/2016-resolved with steroid eyedrops 8. 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 9. Hypercalcemia status post Zometa 01/23/2017   Disposition: Phillip [name] has an improved performance status.  He is tolerating the cabozantinib well.  The liver enzymes are mildly elevated.  This may be related to cabozantinib.  We will monitor this.  The calcium is in the normal range.  His pain has improved.  He is gaining weight.  The plan is to continue cabozantinib.  He will return for a lab visit in 3 weeks.  He will be scheduled for restaging CTs prior to an office visit in 6 weeks.  15 minutes were spent with the patient today.  The majority of the time was used for counseling and coordination of care.  Betsy Coder, MD  04/19/2017  1:03 PM

## 2017-04-19 NOTE — Telephone Encounter (Signed)
Printed avs and calender of upcoming appointment. Per 2/22 los 

## 2017-04-24 ENCOUNTER — Other Ambulatory Visit: Payer: Self-pay | Admitting: Pharmacist

## 2017-05-02 DIAGNOSIS — R29898 Other symptoms and signs involving the musculoskeletal system: Secondary | ICD-10-CM | POA: Diagnosis not present

## 2017-05-02 DIAGNOSIS — R531 Weakness: Secondary | ICD-10-CM | POA: Diagnosis not present

## 2017-05-02 DIAGNOSIS — Z1389 Encounter for screening for other disorder: Secondary | ICD-10-CM | POA: Diagnosis not present

## 2017-05-02 DIAGNOSIS — M6281 Muscle weakness (generalized): Secondary | ICD-10-CM | POA: Diagnosis not present

## 2017-05-02 DIAGNOSIS — E785 Hyperlipidemia, unspecified: Secondary | ICD-10-CM | POA: Diagnosis not present

## 2017-05-02 DIAGNOSIS — R269 Unspecified abnormalities of gait and mobility: Secondary | ICD-10-CM | POA: Diagnosis not present

## 2017-05-02 DIAGNOSIS — C649 Malignant neoplasm of unspecified kidney, except renal pelvis: Secondary | ICD-10-CM | POA: Diagnosis not present

## 2017-05-02 DIAGNOSIS — I10 Essential (primary) hypertension: Secondary | ICD-10-CM | POA: Diagnosis not present

## 2017-05-02 DIAGNOSIS — Z0000 Encounter for general adult medical examination without abnormal findings: Secondary | ICD-10-CM | POA: Diagnosis not present

## 2017-05-04 ENCOUNTER — Other Ambulatory Visit: Payer: Self-pay | Admitting: Oncology

## 2017-05-04 DIAGNOSIS — C649 Malignant neoplasm of unspecified kidney, except renal pelvis: Secondary | ICD-10-CM

## 2017-05-06 DIAGNOSIS — R531 Weakness: Secondary | ICD-10-CM | POA: Diagnosis not present

## 2017-05-06 DIAGNOSIS — M6281 Muscle weakness (generalized): Secondary | ICD-10-CM | POA: Diagnosis not present

## 2017-05-09 DIAGNOSIS — R531 Weakness: Secondary | ICD-10-CM | POA: Diagnosis not present

## 2017-05-09 DIAGNOSIS — M6281 Muscle weakness (generalized): Secondary | ICD-10-CM | POA: Diagnosis not present

## 2017-05-10 ENCOUNTER — Inpatient Hospital Stay: Payer: PPO

## 2017-05-10 ENCOUNTER — Inpatient Hospital Stay: Payer: PPO | Attending: Oncology

## 2017-05-10 DIAGNOSIS — C649 Malignant neoplasm of unspecified kidney, except renal pelvis: Secondary | ICD-10-CM | POA: Insufficient documentation

## 2017-05-10 DIAGNOSIS — C7951 Secondary malignant neoplasm of bone: Secondary | ICD-10-CM | POA: Diagnosis not present

## 2017-05-10 DIAGNOSIS — Z452 Encounter for adjustment and management of vascular access device: Secondary | ICD-10-CM | POA: Diagnosis not present

## 2017-05-10 LAB — CBC WITH DIFFERENTIAL (CANCER CENTER ONLY)
Basophils Absolute: 0.1 10*3/uL (ref 0.0–0.1)
Basophils Relative: 1 %
Eosinophils Absolute: 1 10*3/uL — ABNORMAL HIGH (ref 0.0–0.5)
Eosinophils Relative: 12 %
HCT: 42.5 % (ref 38.4–49.9)
Hemoglobin: 14.2 g/dL (ref 13.0–17.1)
Lymphocytes Relative: 21 %
Lymphs Abs: 1.6 10*3/uL (ref 0.9–3.3)
MCH: 29.3 pg (ref 27.2–33.4)
MCHC: 33.5 g/dL (ref 32.0–36.0)
MCV: 87.4 fL (ref 79.3–98.0)
Monocytes Absolute: 0.6 10*3/uL (ref 0.1–0.9)
Monocytes Relative: 8 %
Neutro Abs: 4.4 10*3/uL (ref 1.5–6.5)
Neutrophils Relative %: 58 %
Platelet Count: 165 10*3/uL (ref 140–400)
RBC: 4.86 MIL/uL (ref 4.20–5.82)
RDW: 18.7 % — ABNORMAL HIGH (ref 11.0–14.6)
WBC Count: 7.7 10*3/uL (ref 4.0–10.3)

## 2017-05-10 LAB — CMP (CANCER CENTER ONLY)
ALT: 87 U/L — ABNORMAL HIGH (ref 0–55)
AST: 38 U/L — ABNORMAL HIGH (ref 5–34)
Albumin: 3.8 g/dL (ref 3.5–5.0)
Alkaline Phosphatase: 79 U/L (ref 40–150)
Anion gap: 7 (ref 3–11)
BUN: 15 mg/dL (ref 7–26)
CO2: 29 mmol/L (ref 22–29)
Calcium: 9.9 mg/dL (ref 8.4–10.4)
Chloride: 102 mmol/L (ref 98–109)
Creatinine: 0.86 mg/dL (ref 0.70–1.30)
GFR, Est AFR Am: >60 mL/min (ref >60)
GFR, Estimated: >60 mL/min (ref >60)
Glucose, Bld: 118 mg/dL (ref 70–140)
Potassium: 4 mmol/L (ref 3.5–5.1)
Sodium: 138 mmol/L (ref 136–145)
Total Bilirubin: 0.4 mg/dL (ref 0.2–1.2)
Total Protein: 6.8 g/dL (ref 6.4–8.3)

## 2017-05-10 MED ORDER — SODIUM CHLORIDE 0.9% FLUSH
10.0000 mL | Freq: Once | INTRAVENOUS | Status: AC
  Administered 2017-05-10: 10 mL via INTRAVENOUS
  Filled 2017-05-10: qty 10

## 2017-05-10 MED ORDER — HEPARIN SOD (PORK) LOCK FLUSH 100 UNIT/ML IV SOLN
500.0000 [IU] | Freq: Once | INTRAVENOUS | Status: AC
  Administered 2017-05-10: 500 [IU] via INTRAVENOUS
  Filled 2017-05-10: qty 5

## 2017-05-10 NOTE — Patient Instructions (Signed)

## 2017-05-13 ENCOUNTER — Other Ambulatory Visit: Payer: Self-pay | Admitting: Oncology

## 2017-05-13 DIAGNOSIS — C649 Malignant neoplasm of unspecified kidney, except renal pelvis: Secondary | ICD-10-CM

## 2017-05-14 DIAGNOSIS — M545 Low back pain: Secondary | ICD-10-CM | POA: Diagnosis not present

## 2017-05-14 DIAGNOSIS — C7951 Secondary malignant neoplasm of bone: Secondary | ICD-10-CM | POA: Diagnosis not present

## 2017-05-14 MED FILL — OxyCONTIN 10 MG T12A: 10 | 53 days supply | Qty: 120 | Fill #0

## 2017-05-15 DIAGNOSIS — M6281 Muscle weakness (generalized): Secondary | ICD-10-CM | POA: Diagnosis not present

## 2017-05-15 DIAGNOSIS — R531 Weakness: Secondary | ICD-10-CM | POA: Diagnosis not present

## 2017-05-16 DIAGNOSIS — M6281 Muscle weakness (generalized): Secondary | ICD-10-CM | POA: Diagnosis not present

## 2017-05-16 DIAGNOSIS — R531 Weakness: Secondary | ICD-10-CM | POA: Diagnosis not present

## 2017-05-20 DIAGNOSIS — M6281 Muscle weakness (generalized): Secondary | ICD-10-CM | POA: Diagnosis not present

## 2017-05-20 DIAGNOSIS — R531 Weakness: Secondary | ICD-10-CM | POA: Diagnosis not present

## 2017-05-20 MED FILL — CABOMETYX 40 MG TABLET: 40 | 30 days supply | Qty: 30 | Fill #0

## 2017-05-23 DIAGNOSIS — M6281 Muscle weakness (generalized): Secondary | ICD-10-CM | POA: Diagnosis not present

## 2017-05-23 DIAGNOSIS — R531 Weakness: Secondary | ICD-10-CM | POA: Diagnosis not present

## 2017-05-27 DIAGNOSIS — M6281 Muscle weakness (generalized): Secondary | ICD-10-CM | POA: Diagnosis not present

## 2017-05-27 DIAGNOSIS — R531 Weakness: Secondary | ICD-10-CM | POA: Diagnosis not present

## 2017-05-30 ENCOUNTER — Ambulatory Visit (HOSPITAL_COMMUNITY)
Admission: RE | Admit: 2017-05-30 | Discharge: 2017-05-30 | Disposition: A | Discharge status: Home / Self Care | Payer: PPO | Source: Ambulatory Visit | Attending: Oncology | Admitting: Oncology

## 2017-05-30 ENCOUNTER — Inpatient Hospital Stay: Payer: PPO

## 2017-05-30 ENCOUNTER — Inpatient Hospital Stay: Payer: PPO | Attending: Oncology

## 2017-05-30 DIAGNOSIS — C649 Malignant neoplasm of unspecified kidney, except renal pelvis: Secondary | ICD-10-CM

## 2017-05-30 DIAGNOSIS — Z452 Encounter for adjustment and management of vascular access device: Secondary | ICD-10-CM | POA: Diagnosis not present

## 2017-05-30 DIAGNOSIS — C7951 Secondary malignant neoplasm of bone: Secondary | ICD-10-CM | POA: Insufficient documentation

## 2017-05-30 DIAGNOSIS — C7970 Secondary malignant neoplasm of unspecified adrenal gland: Secondary | ICD-10-CM | POA: Diagnosis not present

## 2017-05-30 DIAGNOSIS — I10 Essential (primary) hypertension: Secondary | ICD-10-CM | POA: Diagnosis not present

## 2017-05-30 DIAGNOSIS — K769 Liver disease, unspecified: Secondary | ICD-10-CM | POA: Insufficient documentation

## 2017-05-30 DIAGNOSIS — R945 Abnormal results of liver function studies: Secondary | ICD-10-CM | POA: Insufficient documentation

## 2017-05-30 DIAGNOSIS — Z95828 Presence of other vascular implants and grafts: Secondary | ICD-10-CM

## 2017-05-30 DIAGNOSIS — Z9221 Personal history of antineoplastic chemotherapy: Secondary | ICD-10-CM | POA: Diagnosis not present

## 2017-05-30 DIAGNOSIS — Z79899 Other long term (current) drug therapy: Secondary | ICD-10-CM | POA: Insufficient documentation

## 2017-05-30 DIAGNOSIS — R599 Enlarged lymph nodes, unspecified: Secondary | ICD-10-CM | POA: Diagnosis not present

## 2017-05-30 DIAGNOSIS — R918 Other nonspecific abnormal finding of lung field: Secondary | ICD-10-CM | POA: Insufficient documentation

## 2017-05-30 LAB — CBC WITH DIFFERENTIAL (CANCER CENTER ONLY)
Basophils Absolute: 0.1 10*3/uL (ref 0.0–0.1)
Basophils Relative: 1 %
Eosinophils Absolute: 2.1 10*3/uL — ABNORMAL HIGH (ref 0.0–0.5)
Eosinophils Relative: 25 %
HCT: 43.4 % (ref 38.4–49.9)
Hemoglobin: 14.5 g/dL (ref 13.0–17.1)
Lymphocytes Relative: 25 %
Lymphs Abs: 2.1 10*3/uL (ref 0.9–3.3)
MCH: 30.5 pg (ref 27.2–33.4)
MCHC: 33.4 g/dL (ref 32.0–36.0)
MCV: 91.2 fL (ref 79.3–98.0)
Monocytes Absolute: 0.6 10*3/uL (ref 0.1–0.9)
Monocytes Relative: 7 %
Neutro Abs: 3.6 10*3/uL (ref 1.5–6.5)
Neutrophils Relative %: 42 %
Platelet Count: 181 10*3/uL (ref 140–400)
RBC: 4.76 MIL/uL (ref 4.20–5.82)
RDW: 16.8 % — ABNORMAL HIGH (ref 11.0–14.6)
WBC Count: 8.4 10*3/uL (ref 4.0–10.3)

## 2017-05-30 LAB — CMP (CANCER CENTER ONLY)
ALT: 114 U/L — ABNORMAL HIGH (ref 0–55)
AST: 57 U/L — ABNORMAL HIGH (ref 5–34)
Albumin: 3.9 g/dL (ref 3.5–5.0)
Alkaline Phosphatase: 133 U/L (ref 40–150)
Anion gap: 10 (ref 3–11)
BUN: 16 mg/dL (ref 7–26)
CO2: 26 mmol/L (ref 22–29)
Calcium: 9.5 mg/dL (ref 8.4–10.4)
Chloride: 103 mmol/L (ref 98–109)
Creatinine: 0.92 mg/dL (ref 0.70–1.30)
GFR, Est AFR Am: >60 mL/min (ref >60)
GFR, Estimated: >60 mL/min (ref >60)
Glucose, Bld: 99 mg/dL (ref 70–140)
Potassium: 4.2 mmol/L (ref 3.5–5.1)
Sodium: 139 mmol/L (ref 136–145)
Total Bilirubin: 0.4 mg/dL (ref 0.2–1.2)
Total Protein: 7 g/dL (ref 6.4–8.3)

## 2017-05-30 MED ORDER — HEPARIN SOD (PORK) LOCK FLUSH 100 UNIT/ML IV SOLN
INTRAVENOUS | Status: AC
  Filled 2017-05-30: qty 5

## 2017-05-30 MED ORDER — SODIUM CHLORIDE 0.9% FLUSH
10.0000 mL | Freq: Once | INTRAVENOUS | Status: AC
  Administered 2017-05-30: 10 mL
  Filled 2017-05-30: qty 10

## 2017-05-30 MED ORDER — IOPAMIDOL (ISOVUE-300) INJECTION 61%
INTRAVENOUS | Status: AC
  Filled 2017-05-30: qty 100

## 2017-05-30 MED ORDER — IOPAMIDOL (ISOVUE-300) INJECTION 61%
100.0000 mL | Freq: Once | INTRAVENOUS | Status: AC | PRN
  Administered 2017-05-30: 100 mL via INTRAVENOUS

## 2017-05-30 MED ORDER — HEPARIN SOD (PORK) LOCK FLUSH 100 UNIT/ML IV SOLN
500.0000 [IU] | Freq: Once | INTRAVENOUS | Status: AC
  Administered 2017-05-30: 500 [IU] via INTRAVENOUS

## 2017-05-31 ENCOUNTER — Telehealth: Payer: Self-pay | Admitting: Oncology

## 2017-05-31 ENCOUNTER — Inpatient Hospital Stay: Payer: PPO

## 2017-05-31 ENCOUNTER — Inpatient Hospital Stay (HOSPITAL_BASED_OUTPATIENT_CLINIC_OR_DEPARTMENT_OTHER): Payer: PPO | Admitting: Oncology

## 2017-05-31 VITALS — BP 153/77 | HR 57 | Temp 98.3°F | Resp 17 | Ht 70.0 in | Wt 268.4 lb

## 2017-05-31 DIAGNOSIS — Z9221 Personal history of antineoplastic chemotherapy: Secondary | ICD-10-CM | POA: Diagnosis not present

## 2017-05-31 DIAGNOSIS — C649 Malignant neoplasm of unspecified kidney, except renal pelvis: Secondary | ICD-10-CM

## 2017-05-31 DIAGNOSIS — Z7951 Long term (current) use of inhaled steroids: Secondary | ICD-10-CM

## 2017-05-31 DIAGNOSIS — R918 Other nonspecific abnormal finding of lung field: Secondary | ICD-10-CM

## 2017-05-31 DIAGNOSIS — C642 Malignant neoplasm of left kidney, except renal pelvis: Secondary | ICD-10-CM

## 2017-05-31 DIAGNOSIS — Z79899 Other long term (current) drug therapy: Secondary | ICD-10-CM

## 2017-05-31 DIAGNOSIS — I10 Essential (primary) hypertension: Secondary | ICD-10-CM | POA: Diagnosis not present

## 2017-05-31 DIAGNOSIS — Z95828 Presence of other vascular implants and grafts: Secondary | ICD-10-CM

## 2017-05-31 DIAGNOSIS — R945 Abnormal results of liver function studies: Secondary | ICD-10-CM | POA: Diagnosis not present

## 2017-05-31 MED ORDER — PROCHLORPERAZINE MALEATE 10 MG PO TABS
10.0000 mg | ORAL_TABLET | Freq: Four times a day (QID) | ORAL | Status: DC | PRN
  Administered 2017-05-31: 10 mg via ORAL

## 2017-05-31 MED ORDER — ZOLEDRONIC ACID 4 MG/100ML IV SOLN
4.0000 mg | Freq: Once | INTRAVENOUS | Status: AC
  Administered 2017-05-31: 4 mg via INTRAVENOUS
  Filled 2017-05-31: qty 100

## 2017-05-31 MED ORDER — SODIUM CHLORIDE 0.9 % IV SOLN
Freq: Once | INTRAVENOUS | Status: AC
  Administered 2017-05-31: 11:00:00 via INTRAVENOUS

## 2017-05-31 MED ORDER — SODIUM CHLORIDE 0.9% FLUSH
10.0000 mL | Freq: Once | INTRAVENOUS | Status: AC
  Administered 2017-05-31: 10 mL via INTRAVENOUS
  Filled 2017-05-31: qty 10

## 2017-05-31 MED ORDER — PROCHLORPERAZINE MALEATE 10 MG PO TABS
ORAL_TABLET | ORAL | Status: AC
  Filled 2017-05-31: qty 1

## 2017-05-31 MED ORDER — HEPARIN SOD (PORK) LOCK FLUSH 100 UNIT/ML IV SOLN
500.0000 [IU] | Freq: Once | INTRAVENOUS | Status: AC
  Administered 2017-05-31: 500 [IU] via INTRAVENOUS
  Filled 2017-05-31: qty 5

## 2017-05-31 NOTE — Progress Notes (Signed)
[facility] OFFICE PROGRESS NOTE   Diagnosis: Renal cell carcinoma  INTERVAL HISTORY:   [name] returns as scheduled.  He continues cabozantinib.  No mouth sores or rash.  He had no diarrhea until he drank the CT contrast yesterday.  His pain has improved.  He is no longer taking breakthrough pain medication.  He continues physical therapy twice weekly.  He recently took a vacation.  Objective:  Vital signs in last 24 hours:  Blood pressure (!) 153/77, pulse (!) 57, temperature 98.3 F (36.8 C), temperature source Oral, resp. rate 17, height 5\' 10"  (1.778 m), weight 268 lb 6.4 oz (121.7 kg), SpO2 99 %.    HEENT: No thrush or ulcers Resp: Lungs clear bilaterally  Cardio: Regular rate and rhythm GI: No hepatomegaly, nontender Vascular: No leg edema Neuro: He was able to ambulate to the examination table with minimal assistance Skin: Mild acne type rash over the anterior chest  Portacath/PICC-without erythema  Lab Results:  Lab Results  Component Value Date   WBC 8.4 05/30/2017   HGB 15.0 03/08/2017   HCT 43.4 05/30/2017   MCV 91.2 05/30/2017   PLT 181 05/30/2017   NEUTROABS 3.6 05/30/2017    CMP     Component Value Date/Time   NA 139 05/30/2017 1002   NA 140 02/21/2017 1128   K 4.2 05/30/2017 1002   K 4.3 02/21/2017 1128   CL 103 05/30/2017 1002   CO2 26 05/30/2017 1002   CO2 26 02/21/2017 1128   GLUCOSE 99 05/30/2017 1002   GLUCOSE 115 02/21/2017 1128   BUN 16 05/30/2017 1002   BUN 16.6 02/21/2017 1128   CREATININE 0.92 05/30/2017 1002   CREATININE 0.8 02/21/2017 1128   CALCIUM 9.5 05/30/2017 1002   CALCIUM 9.4 02/21/2017 1128   PROT 7.0 05/30/2017 1002   PROT 7.5 02/21/2017 1128   ALBUMIN 3.9 05/30/2017 1002   ALBUMIN 4.2 02/21/2017 1128   AST 57 (H) 05/30/2017 1002   AST 20 02/21/2017 1128   ALT 114 (H) 05/30/2017 1002   ALT 43 02/21/2017 1128   ALKPHOS 133 05/30/2017 1002   ALKPHOS 104 02/21/2017 1128   BILITOT 0.4 05/30/2017  1002   BILITOT 0.36 02/21/2017 1128   GFRNONAA >60 05/30/2017 1002   GFRAA >60 05/30/2017 1002    Lab Results  Component Value Date   CEA1 1.77 01/04/2016    Lab Results  Component Value Date   INR 0.98 05/16/2016    Imaging:  Ct Chest W Contrast  Result Date: 05/30/2017 CLINICAL DATA:  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 History of metastatic disease to the L4 vertebral body ongoing immunotherapy. EXAM: CT CHEST, ABDOMEN, AND PELVIS WITH CONTRAST TECHNIQUE: Multidetector CT imaging of the chest, abdomen and pelvis was performed following the standard protocol during bolus administration of intravenous contrast. CONTRAST:  129mL ISOVUE-300 IOPAMIDOL (ISOVUE-300) INJECTION 61% COMPARISON:  CT  01/21/2017 FINDINGS: CT CHEST FINDINGS Cardiovascular: The heart is normal in size. No pericardial effusion. There is mild tortuosity of the thoracic aorta but no aneurysm or dissection. Scattered atherosclerotic calcifications. Stable scattered coronary artery calcifications. The right Port-A-Cath tip is in good position at the cavoatrial junction. Mediastinum/Nodes: No mediastinal or hilar mass or lymphadenopathy. Small scattered sub 8 mm lymph nodes are stable. The esophagus is grossly normal. Lungs/Pleura: Continued regression of the left upper lobe pulmonary lesion near the left pulmonary artery. On image number 27 of series 2 measures 13 x 13.5 mm and previously measured 20  x 16.5 mm. Small triangular density in the right lower lobe on image number 84 measures 5 mm and is relatively stable. No new pulmonary lesions or acute pulmonary findings. No pleural effusion. Musculoskeletal: Osseous metastatic disease is again demonstrated. The T2 vertebral body lesion show some healing changes with a thickened sclerotic rim and increased bony density. There is also some healing of the destroyed right second posterior rib with increasing density also suggesting healing. The destroyed right  eleventh posterior rib also demonstrates slight healing changes but is still largely lytic but definitely smaller. CT ABDOMEN PELVIS FINDINGS Hepatobiliary: 15 x 14 mm left hepatic lobe lesion previously measured 18 x 16 mm. No new hepatic lesions. Stable cholelithiasis. No common bile duct dilatation. Pancreas: No mass, inflammation or ductal dilatation. Spleen: Normal size.  No focal lesions. Adrenals/Urinary Tract: Interval regression of the right adrenal gland mass. It previously measured 24.5 mm and now measures 9 mm. The 17.5 mm left adrenal gland nodule is no longer identified. The right kidney is unremarkable and stable. The left renal mass demonstrates improvement since the prior CT scan. On the prior CT scan this measured 6.4 x 5.3 x 5.3 cm in demonstrated thick nodular peripheral enhancement. The lesion now measures 3.7 x 3.5 x 3.1 cm with now a more thin or moderate rim of enhancement and central necrosis. Stomach/Bowel: The stomach, duodenum, small bowel and colon are grossly normal. No acute inflammatory process, mass lesions or obstructive findings. Vascular/Lymphatic: Significant interval regression of retroperitoneal lymphadenopathy. The right retroperitoneal lymph node on image number 74 measures 1.5 x 1.0 cm and previously measured 3.2 x 2.5 cm. 12 mm interaortocaval lymph node seen on the prior study has resolved. 10 mm left inguinal lymph node previously measured 13 mm. Reproductive: The prostate gland is enlarged but stable. Seminal vesicles are unremarkable. Other: No free pelvic fluid collections. No inguinal mass or adenopathy. Stable small bilateral inguinal hernias containing fat. Musculoskeletal: Large destructive right-sided L4 lesion involving the pedicle and transverse process appears relatively stable. I do not see any significant healing changes. Lytic left acetabular lesion shows improvement with no significant residual soft tissue mass component and some bony healing changes. No  new lesions. IMPRESSION: 1. Positive response to therapy is demonstrated with smaller left hilar mass, significant decrease in size of the left renal mass and retroperitoneal adenopathy and healing/improving bone lesions. The adrenal gland metastasis have also significantly improved. 2. No new or progressive findings are demonstrated. 3. Stable small liver lesion, likely benign cyst. Electronically Signed   By: Marijo Sanes M.D.   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 History of metastatic disease to the L4 vertebral body ongoing immunotherapy. EXAM: CT CHEST, ABDOMEN, AND PELVIS WITH CONTRAST TECHNIQUE: Multidetector CT imaging of the chest, abdomen and pelvis was performed following the standard protocol during bolus administration of intravenous contrast. CONTRAST:  156mL ISOVUE-300 IOPAMIDOL (ISOVUE-300) INJECTION 61% COMPARISON:  CT  01/21/2017 FINDINGS: CT CHEST FINDINGS Cardiovascular: The heart is normal in size. No pericardial effusion. There is mild tortuosity of the thoracic aorta but no aneurysm or dissection. Scattered atherosclerotic calcifications. Stable scattered coronary artery calcifications. The right Port-A-Cath tip is in good position at the cavoatrial junction. Mediastinum/Nodes: No mediastinal or hilar mass or lymphadenopathy. Small scattered sub 8 mm lymph nodes are stable. The esophagus is grossly normal. Lungs/Pleura: Continued regression of the left upper lobe pulmonary lesion near  the left pulmonary artery. On image number 27 of series 2 measures 13 x 13.5 mm and previously measured 20 x 16.5 mm. Small triangular density in the right lower lobe on image number 84 measures 5 mm and is relatively stable. No new pulmonary lesions or acute pulmonary findings. No pleural effusion. Musculoskeletal: Osseous metastatic disease is again demonstrated. The T2 vertebral body lesion  show some healing changes with a thickened sclerotic rim and increased bony density. There is also some healing of the destroyed right second posterior rib with increasing density also suggesting healing. The destroyed right eleventh posterior rib also demonstrates slight healing changes but is still largely lytic but definitely smaller. CT ABDOMEN PELVIS FINDINGS Hepatobiliary: 15 x 14 mm left hepatic lobe lesion previously measured 18 x 16 mm. No new hepatic lesions. Stable cholelithiasis. No common bile duct dilatation. Pancreas: No mass, inflammation or ductal dilatation. Spleen: Normal size.  No focal lesions. Adrenals/Urinary Tract: Interval regression of the right adrenal gland mass. It previously measured 24.5 mm and now measures 9 mm. The 17.5 mm left adrenal gland nodule is no longer identified. The right kidney is unremarkable and stable. The left renal mass demonstrates improvement since the prior CT scan. On the prior CT scan this measured 6.4 x 5.3 x 5.3 cm in demonstrated thick nodular peripheral enhancement. The lesion now measures 3.7 x 3.5 x 3.1 cm with now a more thin or moderate rim of enhancement and central necrosis. Stomach/Bowel: The stomach, duodenum, small bowel and colon are grossly normal. No acute inflammatory process, mass lesions or obstructive findings. Vascular/Lymphatic: Significant interval regression of retroperitoneal lymphadenopathy. The right retroperitoneal lymph node on image number 74 measures 1.5 x 1.0 cm and previously measured 3.2 x 2.5 cm. 12 mm interaortocaval lymph node seen on the prior study has resolved. 10 mm left inguinal lymph node previously measured 13 mm. Reproductive: The prostate gland is enlarged but stable. Seminal vesicles are unremarkable. Other: No free pelvic fluid collections. No inguinal mass or adenopathy. Stable small bilateral inguinal hernias containing fat. Musculoskeletal: Large destructive right-sided L4 lesion involving the pedicle and  transverse process appears relatively stable. I do not see any significant healing changes. Lytic left acetabular lesion shows improvement with no significant residual soft tissue mass component and some bony healing changes. No new lesions. IMPRESSION: 1. Positive response to therapy is demonstrated with smaller left hilar mass, significant decrease in size of the left renal mass and retroperitoneal adenopathy and healing/improving bone lesions. The adrenal gland metastasis have also significantly improved. 2. No new or progressive findings are demonstrated. 3. Stable small liver lesion, likely benign cyst. Electronically Signed   By: Marijo Sanes M.D.   On: 05/30/2017 15:57    Medications: I have reviewed the patient's current medications.   Assessment/Plan:  1. Metastatic renal cell carcinoma   L4 mass with extraosseous extension, L4 nerve compression  Biopsy of the L4 mass 11/18/2015 confirmed metastatic renal cell carcinoma, clear cell type  CTs of the chest, abdomen, and pelvis 11/18/2015-right lower lobe nodule, expansile lytic lesion at the right 11th rib/costal vertebral junction, left renal mass, expansile lesion involving the L4 vertebra, lytic lesion at the left acetabulum, and a low-attenuation liver lesion  Initiation of SRS to L4 12/02/2015, Completed 12/12/2015  Initiation of Pazopanib11/04/2015  Pazopanibplaced on hold 02/06/2016 secondary to elevated liver enzymes  Pazopanibresumed 03/07/2016 at a dose of 400 mg daily  Pazopanibdiscontinued 03/19/2016 secondary to elevated liver enzymes  Restaging CTs 04/02/2016-stable left renal mass, decreased  soft tissue component associated with the L4 metastasis, increased soft tissue component associated with the right 11th rib metastasis with increased T11 bony destruction, increased sclerosis at the left acetabulum lesion  Cycle 1 nivolumab 04/12/2016  Cycle 2 nivolumab 04/26/2016  Cycle 3 nivolumab03/16/2018  Cycle  4 nivolumab 05/24/2016  Cycle 5 nivolumab 06/08/2016  MRI lumbar spine 06/21/2016-unchanged tumor at L3, increased size of retroperitoneal lymph nodes compared to a CT from 04/02/2016  Cycle 6 nivolumab 06/22/2016  CTs chest, abdomen, and pelvis 07/04/2016-enlargement of the left renal mass, right adrenal nodule, left hilar and peritoneal lymph nodes, enlargement of left acetabular lesion. Stable lung nodules.  Cycle 7 nivolumab05/12/2016   Cycle 8 nivolumab 07/20/2016  Cycle 9 nivolumab 08/02/2016  Cycle 10 nivolumab 08/20/2016  Restaging CT 09/03/2016 evaluation with stable disease  Cycle 11 nivolumab 09/05/2016  Cycle 12 nivolumab 09/19/2016  Cycle 13 nivolumab 10/03/2016  Cycle 14 nivolumab 10/17/2016  Cycle 15 nivolumab 10/31/2016  Cycle 16 nivolumab 11/14/2016 (changed to monthly schedule)  Cycle 17nivolumab10/24/2018  CTs 01/21/2017-increased left renal mass, increased size of adrenal metastases, increased lytic bone lesions, increased left lung nodule, persistent tumor at L4 with probable epidural component  Initiation of Cabozantinib12/04/2016  Restaging CTs 05/30/2017- decreased size of left hilar mass, left renal mass, retroperitoneal adenopathy, and adrenal metastasis.  Healing bone lesions.  Cabozantinib continued 2. Pain secondary to #1-managed by Dr. Lovenia Shuck.  Improved 3. Hypertension 4. Elevated transaminases 02/06/2016-Pazopanibplaced on hold  Liver enzymes normal 03/07/2016 5. Port-A-Cath placement 05/16/2016 6. Malaise/anorexia 09/05/2016. Cortisol and testosterone levels low. Hydrocortisone and testosterone replacement initiated. 7. Conjunctival/scleral erythema 09/19/2016-resolved with steroid eyedrops 8. 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 9. Hypercalcemia status post Zometa 01/23/2017-resolved    Disposition: Mr. Busk has been maintained on cabozantinib since  December 2018.  His performance status is significantly improved and the restaging CTs confirm a response to therapy.  He will continue cabozantinib.  I reviewed the CT images with him today. He has multiple bone lesions including a destructive mass at L4.  He will continue Zometa every 3 months. The liver enzymes are mildly elevated, likely related to cabozantinib. Mr. Budney will return for a lab visit 06/21/2017 and an office visit 07/15/2017.  25 minutes were spent with the patient today.  The majority of the time was used for counseling and coordination of care.  Betsy Coder, MD  05/31/2017  4:26 PM

## 2017-05-31 NOTE — Patient Instructions (Signed)

## 2017-05-31 NOTE — Telephone Encounter (Signed)
Scheduled appt per 4/5 los - Gave patient AVS and calender per los.

## 2017-06-03 DIAGNOSIS — M6281 Muscle weakness (generalized): Secondary | ICD-10-CM | POA: Diagnosis not present

## 2017-06-03 DIAGNOSIS — R531 Weakness: Secondary | ICD-10-CM | POA: Diagnosis not present

## 2017-06-06 DIAGNOSIS — M6281 Muscle weakness (generalized): Secondary | ICD-10-CM | POA: Diagnosis not present

## 2017-06-06 DIAGNOSIS — R531 Weakness: Secondary | ICD-10-CM | POA: Diagnosis not present

## 2017-06-10 DIAGNOSIS — M6281 Muscle weakness (generalized): Secondary | ICD-10-CM | POA: Diagnosis not present

## 2017-06-10 DIAGNOSIS — R531 Weakness: Secondary | ICD-10-CM | POA: Diagnosis not present

## 2017-06-13 DIAGNOSIS — M6281 Muscle weakness (generalized): Secondary | ICD-10-CM | POA: Diagnosis not present

## 2017-06-13 DIAGNOSIS — R531 Weakness: Secondary | ICD-10-CM | POA: Diagnosis not present

## 2017-06-17 DIAGNOSIS — R531 Weakness: Secondary | ICD-10-CM | POA: Diagnosis not present

## 2017-06-17 DIAGNOSIS — M6281 Muscle weakness (generalized): Secondary | ICD-10-CM | POA: Diagnosis not present

## 2017-06-19 ENCOUNTER — Other Ambulatory Visit: Payer: Self-pay | Admitting: Oncology

## 2017-06-19 DIAGNOSIS — C649 Malignant neoplasm of unspecified kidney, except renal pelvis: Secondary | ICD-10-CM

## 2017-06-20 DIAGNOSIS — M6281 Muscle weakness (generalized): Secondary | ICD-10-CM | POA: Diagnosis not present

## 2017-06-20 DIAGNOSIS — R531 Weakness: Secondary | ICD-10-CM | POA: Diagnosis not present

## 2017-06-21 ENCOUNTER — Inpatient Hospital Stay: Payer: PPO

## 2017-06-21 DIAGNOSIS — C642 Malignant neoplasm of left kidney, except renal pelvis: Secondary | ICD-10-CM

## 2017-06-21 DIAGNOSIS — Z95828 Presence of other vascular implants and grafts: Secondary | ICD-10-CM

## 2017-06-21 DIAGNOSIS — C649 Malignant neoplasm of unspecified kidney, except renal pelvis: Secondary | ICD-10-CM | POA: Diagnosis not present

## 2017-06-21 LAB — CBC WITH DIFFERENTIAL (CANCER CENTER ONLY)
Basophils Absolute: 0.1 10*3/uL (ref 0.0–0.1)
Basophils Relative: 1 %
Eosinophils Absolute: 2.7 10*3/uL — ABNORMAL HIGH (ref 0.0–0.5)
Eosinophils Relative: 27 %
HCT: 41.6 % (ref 38.4–49.9)
Hemoglobin: 14 g/dL (ref 13.0–17.1)
Lymphocytes Relative: 21 %
Lymphs Abs: 2.1 10*3/uL (ref 0.9–3.3)
MCH: 30.9 pg (ref 27.2–33.4)
MCHC: 33.6 g/dL (ref 32.0–36.0)
MCV: 91.9 fL (ref 79.3–98.0)
Monocytes Absolute: 0.7 10*3/uL (ref 0.1–0.9)
Monocytes Relative: 7 %
Neutro Abs: 4.4 10*3/uL (ref 1.5–6.5)
Neutrophils Relative %: 44 %
Platelet Count: 181 10*3/uL (ref 140–400)
RBC: 4.53 MIL/uL (ref 4.20–5.82)
RDW: 15.6 % — ABNORMAL HIGH (ref 11.0–14.6)
WBC Count: 9.9 10*3/uL (ref 4.0–10.3)

## 2017-06-21 LAB — LIPID PANEL
Cholesterol: 187 mg/dL (ref 0–200)
HDL: 36 mg/dL — ABNORMAL LOW (ref >40)
LDL Cholesterol: 94 mg/dL (ref 0–99)
Total CHOL/HDL Ratio: 5.2 RATIO
Triglycerides: 285 mg/dL — ABNORMAL HIGH (ref <150)
VLDL: 57 mg/dL — ABNORMAL HIGH (ref 0–40)

## 2017-06-21 LAB — CMP (CANCER CENTER ONLY)
ALT: 121 U/L — ABNORMAL HIGH (ref 0–55)
AST: 37 U/L — ABNORMAL HIGH (ref 5–34)
Albumin: 3.7 g/dL (ref 3.5–5.0)
Alkaline Phosphatase: 86 U/L (ref 40–150)
Anion gap: 9 (ref 3–11)
BUN: 16 mg/dL (ref 7–26)
CO2: 27 mmol/L (ref 22–29)
Calcium: 9.7 mg/dL (ref 8.4–10.4)
Chloride: 102 mmol/L (ref 98–109)
Creatinine: 0.96 mg/dL (ref 0.70–1.30)
GFR, Est AFR Am: >60 mL/min (ref >60)
GFR, Estimated: >60 mL/min (ref >60)
Glucose, Bld: 133 mg/dL (ref 70–140)
Potassium: 4 mmol/L (ref 3.5–5.1)
Sodium: 138 mmol/L (ref 136–145)
Total Bilirubin: 0.4 mg/dL (ref 0.2–1.2)
Total Protein: 6.8 g/dL (ref 6.4–8.3)

## 2017-06-21 LAB — TSH: TSH: 0.424 u[IU]/mL (ref 0.320–4.118)

## 2017-06-21 MED ORDER — SODIUM CHLORIDE 0.9% FLUSH
10.0000 mL | Freq: Once | INTRAVENOUS | Status: AC
  Administered 2017-06-21: 10 mL
  Filled 2017-06-21: qty 10

## 2017-06-21 MED ORDER — HEPARIN SOD (PORK) LOCK FLUSH 100 UNIT/ML IV SOLN
500.0000 [IU] | Freq: Once | INTRAVENOUS | Status: AC
  Administered 2017-06-21: 500 [IU]
  Filled 2017-06-21: qty 5

## 2017-06-24 ENCOUNTER — Other Ambulatory Visit: Payer: Self-pay

## 2017-06-24 DIAGNOSIS — M6281 Muscle weakness (generalized): Secondary | ICD-10-CM | POA: Diagnosis not present

## 2017-06-24 DIAGNOSIS — R531 Weakness: Secondary | ICD-10-CM | POA: Diagnosis not present

## 2017-06-24 DIAGNOSIS — C649 Malignant neoplasm of unspecified kidney, except renal pelvis: Secondary | ICD-10-CM

## 2017-06-24 MED ORDER — TESTOSTERONE 50 MG/5GM (1%) TD GEL: 7.5000 g | Freq: Every day | TRANSDERMAL | 1 refills | Status: DC

## 2017-06-24 MED FILL — CABOMETYX 40 MG TABLET: 40 | 30 days supply | Qty: 30 | Fill #0

## 2017-06-26 DIAGNOSIS — C7951 Secondary malignant neoplasm of bone: Secondary | ICD-10-CM | POA: Diagnosis not present

## 2017-06-27 DIAGNOSIS — R531 Weakness: Secondary | ICD-10-CM | POA: Diagnosis not present

## 2017-06-27 DIAGNOSIS — M6281 Muscle weakness (generalized): Secondary | ICD-10-CM | POA: Diagnosis not present

## 2017-06-27 MED FILL — OxyCONTIN 10 MG T12A: 10 | 30 days supply | Qty: 90 | Fill #0

## 2017-07-01 DIAGNOSIS — R531 Weakness: Secondary | ICD-10-CM | POA: Diagnosis not present

## 2017-07-01 DIAGNOSIS — M6281 Muscle weakness (generalized): Secondary | ICD-10-CM | POA: Diagnosis not present

## 2017-07-04 DIAGNOSIS — R531 Weakness: Secondary | ICD-10-CM | POA: Diagnosis not present

## 2017-07-04 DIAGNOSIS — M6281 Muscle weakness (generalized): Secondary | ICD-10-CM | POA: Diagnosis not present

## 2017-07-15 ENCOUNTER — Inpatient Hospital Stay: Payer: PPO | Attending: Oncology | Admitting: Nurse Practitioner

## 2017-07-15 ENCOUNTER — Inpatient Hospital Stay: Payer: PPO

## 2017-07-15 ENCOUNTER — Telehealth: Payer: Self-pay | Admitting: Oncology

## 2017-07-15 ENCOUNTER — Encounter: Payer: Self-pay | Admitting: Nurse Practitioner

## 2017-07-15 VITALS — BP 156/67 | HR 53 | Temp 98.2°F | Resp 17 | Ht 70.0 in | Wt 271.4 lb

## 2017-07-15 DIAGNOSIS — R918 Other nonspecific abnormal finding of lung field: Secondary | ICD-10-CM | POA: Diagnosis not present

## 2017-07-15 DIAGNOSIS — I10 Essential (primary) hypertension: Secondary | ICD-10-CM | POA: Diagnosis not present

## 2017-07-15 DIAGNOSIS — Z79899 Other long term (current) drug therapy: Secondary | ICD-10-CM | POA: Diagnosis not present

## 2017-07-15 DIAGNOSIS — R110 Nausea: Secondary | ICD-10-CM | POA: Diagnosis not present

## 2017-07-15 DIAGNOSIS — R945 Abnormal results of liver function studies: Secondary | ICD-10-CM | POA: Diagnosis not present

## 2017-07-15 DIAGNOSIS — C7951 Secondary malignant neoplasm of bone: Secondary | ICD-10-CM | POA: Insufficient documentation

## 2017-07-15 DIAGNOSIS — M6281 Muscle weakness (generalized): Secondary | ICD-10-CM | POA: Diagnosis not present

## 2017-07-15 DIAGNOSIS — Z9221 Personal history of antineoplastic chemotherapy: Secondary | ICD-10-CM | POA: Insufficient documentation

## 2017-07-15 DIAGNOSIS — C649 Malignant neoplasm of unspecified kidney, except renal pelvis: Secondary | ICD-10-CM | POA: Insufficient documentation

## 2017-07-15 DIAGNOSIS — Z95828 Presence of other vascular implants and grafts: Secondary | ICD-10-CM

## 2017-07-15 DIAGNOSIS — R531 Weakness: Secondary | ICD-10-CM | POA: Diagnosis not present

## 2017-07-15 DIAGNOSIS — C642 Malignant neoplasm of left kidney, except renal pelvis: Secondary | ICD-10-CM

## 2017-07-15 LAB — CMP (CANCER CENTER ONLY)
ALT: 70 U/L — ABNORMAL HIGH (ref 0–55)
AST: 36 U/L — ABNORMAL HIGH (ref 5–34)
Albumin: 3.6 g/dL (ref 3.5–5.0)
Alkaline Phosphatase: 53 U/L (ref 40–150)
Anion gap: 8 (ref 3–11)
BUN: 13 mg/dL (ref 7–26)
CO2: 29 mmol/L (ref 22–29)
Calcium: 9 mg/dL (ref 8.4–10.4)
Chloride: 104 mmol/L (ref 98–109)
Creatinine: 0.87 mg/dL (ref 0.70–1.30)
GFR, Est AFR Am: >60 mL/min (ref >60)
GFR, Estimated: >60 mL/min (ref >60)
Glucose, Bld: 129 mg/dL (ref 70–140)
Potassium: 3.8 mmol/L (ref 3.5–5.1)
Sodium: 141 mmol/L (ref 136–145)
Total Bilirubin: 0.3 mg/dL (ref 0.2–1.2)
Total Protein: 6.5 g/dL (ref 6.4–8.3)

## 2017-07-15 LAB — CBC WITH DIFFERENTIAL (CANCER CENTER ONLY)
Basophils Absolute: 0.1 10*3/uL (ref 0.0–0.1)
Basophils Relative: 1 %
Eosinophils Absolute: 0.6 10*3/uL — ABNORMAL HIGH (ref 0.0–0.5)
Eosinophils Relative: 9 %
HCT: 39.8 % (ref 38.4–49.9)
Hemoglobin: 13.4 g/dL (ref 13.0–17.1)
Lymphocytes Relative: 25 %
Lymphs Abs: 1.7 10*3/uL (ref 0.9–3.3)
MCH: 31.1 pg (ref 27.2–33.4)
MCHC: 33.5 g/dL (ref 32.0–36.0)
MCV: 92.7 fL (ref 79.3–98.0)
Monocytes Absolute: 0.5 10*3/uL (ref 0.1–0.9)
Monocytes Relative: 8 %
Neutro Abs: 3.9 10*3/uL (ref 1.5–6.5)
Neutrophils Relative %: 57 %
Platelet Count: 176 10*3/uL (ref 140–400)
RBC: 4.3 MIL/uL (ref 4.20–5.82)
RDW: 14.5 % (ref 11.0–14.6)
WBC Count: 6.9 10*3/uL (ref 4.0–10.3)

## 2017-07-15 MED ORDER — HEPARIN SOD (PORK) LOCK FLUSH 100 UNIT/ML IV SOLN
500.0000 [IU] | Freq: Once | INTRAVENOUS | Status: AC
  Administered 2017-07-15: 500 [IU]
  Filled 2017-07-15: qty 5

## 2017-07-15 MED ORDER — SODIUM CHLORIDE 0.9% FLUSH
10.0000 mL | Freq: Once | INTRAVENOUS | Status: AC
  Administered 2017-07-15: 10 mL
  Filled 2017-07-15: qty 10

## 2017-07-15 NOTE — Progress Notes (Addendum)
[facility] OFFICE PROGRESS NOTE   Diagnosis: Renal cell carcinoma  INTERVAL HISTORY:   Phillip [name] returns as scheduled.  He continues cabozantinib.  For the past month he has been experiencing nausea when he wakes up in the morning.  He takes Zofran which intermittently relieves the nausea.  No vomiting.  No reflux symptoms.  No unusual headaches.  No vision change.  He has also noted increased frequency of bowel movements.  He denies diarrhea.  The stools are generally soft.  He notes an alteration in taste.  Ambulation continues to improve.  He continues physical therapy.  Objective:  Vital signs in last 24 hours:  Blood pressure (!) 156/67, pulse (!) 53, temperature 98.2 F (36.8 C), temperature source Oral, resp. rate 17, height 5\' 10"  (1.778 m), weight 271 lb 6.4 oz (123.1 kg), SpO2 100 %.    HEENT: No thrush or ulcers.  PERRLA.  Extraocular movements intact. Resp: Lungs clear bilaterally. Cardio: Regular rate and rhythm. GI: Abdomen soft and nontender.  No hepatomegaly. Vascular: No leg edema. Neuro: Alert and oriented.  He was able to ambulate to the exam table with no assistance. Skin: Mild acne type rash over the anterior chest. Port-A-Cath without erythema.   Lab Results:  Lab Results  Component Value Date   WBC 9.9 06/21/2017   HGB 14.0 06/21/2017   HCT 41.6 06/21/2017   MCV 91.9 06/21/2017   PLT 181 06/21/2017   NEUTROABS 4.4 06/21/2017    Imaging:  No results found.  Medications: I have reviewed the patient's current medications.  Assessment/Plan: 1. Metastatic renal cell carcinoma   L4 mass with extraosseous extension, L4 nerve compression  Biopsy of the L4 mass 11/18/2015 confirmed metastatic renal cell carcinoma, clear cell type  CTs of the chest, abdomen, and pelvis 11/18/2015-right lower lobe nodule, expansile lytic lesion at the right 11th rib/costal vertebral junction, left renal mass, expansile lesion involving the L4  vertebra, lytic lesion at the left acetabulum, and a low-attenuation liver lesion  Initiation of SRS to L4 12/02/2015, Completed 12/12/2015  Initiation of Pazopanib11/04/2015  Pazopanibplaced on hold 02/06/2016 secondary to elevated liver enzymes  Pazopanibresumed 03/07/2016 at a dose of 400 mg daily  Pazopanibdiscontinued 03/19/2016 secondary to elevated liver enzymes  Restaging CTs 04/02/2016-stable left renal mass, decreased soft tissue component associated with the L4 metastasis, increased soft tissue component associated with the right 11th rib metastasis with increased T11 bony destruction, increased sclerosis at the left acetabulum lesion  Cycle 1 nivolumab 04/12/2016  Cycle 2 nivolumab 04/26/2016  Cycle 3 nivolumab03/16/2018  Cycle 4 nivolumab 05/24/2016  Cycle 5 nivolumab 06/08/2016  MRI lumbar spine 06/21/2016-unchanged tumor at L3, increased size of retroperitoneal lymph nodes compared to a CT from 04/02/2016  Cycle 6 nivolumab 06/22/2016  CTs chest, abdomen, and pelvis 07/04/2016-enlargement of the left renal mass, right adrenal nodule, left hilar and peritoneal lymph nodes, enlargement of left acetabular lesion. Stable lung nodules.  Cycle 7 nivolumab05/12/2016   Cycle 8 nivolumab 07/20/2016  Cycle 9 nivolumab 08/02/2016  Cycle 10 nivolumab 08/20/2016  Restaging CT 09/03/2016 evaluation with stable disease  Cycle 11 nivolumab 09/05/2016  Cycle 12 nivolumab 09/19/2016  Cycle 13 nivolumab 10/03/2016  Cycle 14 nivolumab 10/17/2016  Cycle 15 nivolumab 10/31/2016  Cycle 16 nivolumab 11/14/2016 (changed to monthly schedule)  Cycle 17nivolumab10/24/2018  CTs 01/21/2017-increased left renal mass, increased size of adrenal metastases, increased lytic bone lesions, increased left lung nodule, persistent tumor at L4 with probable epidural component  Initiation of Cabozantinib12/04/2016  Restaging CTs 05/30/2017- decreased size of left hilar mass,  left renal mass, retroperitoneal adenopathy, and adrenal metastasis.  Healing bone lesions.  Cabozantinib continued 2. Pain secondary to #1-managed by Dr. Lovenia Shuck.  Improved Hypertension 3. Elevated transaminases 02/06/2016-Pazopanibplaced on hold  Liver enzymes normal 03/07/2016 5. Port-A-Cath placement 05/16/2016 6. Malaise/anorexia 09/05/2016. Cortisol and testosterone levels low. Hydrocortisone and testosterone replacement initiated. 7. Conjunctival/scleral erythema 09/19/2016-resolved with steroid eyedrops 8. 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 9. Hypercalcemia status post Zometa 01/23/2017-resolved     Disposition: Mr. Rosas appears stable.  Performance status continues to be improved.  He will continue cabozantinib.    He is having mild nausea.  He will continue Zofran as needed.  He will begin over-the-counter Pepcid.  He and his wife understand to contact the office if the nausea persists or he develops any worrisome symptoms.  We reviewed the labs from today.  Liver enzymes continue to be mildly elevated, likely related to cabozantinib.  He will return for lab in 4 weeks and lab, Port-A-Cath flush and office visit in 8 weeks.  He will contact the office in the interim as outlined above or with any other problems.  Patient seen with Dr. [name].  25 minutes were spent face-to-face at today's visit with the majority of that time involved in counseling/coordination of care.    [name] ANP/GNP-BC   07/15/2017  12:18 PM  This was a shared visit with [name].  Mr. Standing has an improved performance status.  He continues cabozantinib.  The nausea may be related to cabozantinib.  He will try an antacid.  He will contact us if this does not help.  Julieanne Manson, MD

## 2017-07-15 NOTE — Telephone Encounter (Signed)
Appointments scheduled AVS/Calendar printed per 5/20 los °

## 2017-07-17 ENCOUNTER — Other Ambulatory Visit: Payer: Self-pay | Admitting: *Deleted

## 2017-07-17 ENCOUNTER — Other Ambulatory Visit: Payer: Self-pay | Admitting: Oncology

## 2017-07-17 DIAGNOSIS — C649 Malignant neoplasm of unspecified kidney, except renal pelvis: Secondary | ICD-10-CM

## 2017-07-17 NOTE — Patient Outreach (Signed)
HTA High Risk Patient screening attempted. Left message and requested a return call. I will call him another day if I do not hear back from him.  Phillip [name]. Myrtie Neither, MSN, [facility] Gerontological Nurse Practitioner [facility] Care Management [phone]

## 2017-07-18 DIAGNOSIS — M6281 Muscle weakness (generalized): Secondary | ICD-10-CM | POA: Diagnosis not present

## 2017-07-18 DIAGNOSIS — R531 Weakness: Secondary | ICD-10-CM | POA: Diagnosis not present

## 2017-07-23 MED FILL — CABOMETYX 40 MG TABLET: 40 | 30 days supply | Qty: 30 | Fill #0

## 2017-07-25 DIAGNOSIS — M6281 Muscle weakness (generalized): Secondary | ICD-10-CM | POA: Diagnosis not present

## 2017-07-25 DIAGNOSIS — R531 Weakness: Secondary | ICD-10-CM | POA: Diagnosis not present

## 2017-07-26 MED FILL — OxyCONTIN 10 MG T12A: 10 | 30 days supply | Qty: 90 | Fill #0

## 2017-07-29 DIAGNOSIS — R531 Weakness: Secondary | ICD-10-CM | POA: Diagnosis not present

## 2017-07-29 DIAGNOSIS — M6281 Muscle weakness (generalized): Secondary | ICD-10-CM | POA: Diagnosis not present

## 2017-08-01 DIAGNOSIS — M6281 Muscle weakness (generalized): Secondary | ICD-10-CM | POA: Diagnosis not present

## 2017-08-01 DIAGNOSIS — R531 Weakness: Secondary | ICD-10-CM | POA: Diagnosis not present

## 2017-08-02 ENCOUNTER — Encounter: Payer: Self-pay | Admitting: *Deleted

## 2017-08-02 ENCOUNTER — Other Ambulatory Visit: Payer: Self-pay | Admitting: *Deleted

## 2017-08-02 NOTE — Patient Outreach (Signed)
Second screening outreach for High Risk HTA patient. Today, I was able to speak with Phillip [name]. She reports that the pt conitnues to receive cancer treatments and is doing well. She is a Marine scientist herself. I asked about advanced directives and he does not have a HCPOA or Livng Will, but he does have a MOST form. This is currently NOT posted in the home. I have offered to send the documents and she is very appreciative of that. I have encouraqed her to keep my name and number for future reference.  Phillip [name]. Phillip Neither, MSN, [facility] Gerontological Nurse Practitioner [facility] Care Management [phone]

## 2017-08-03 ENCOUNTER — Other Ambulatory Visit: Payer: Self-pay | Admitting: Nurse Practitioner

## 2017-08-03 ENCOUNTER — Other Ambulatory Visit: Payer: Self-pay | Admitting: Oncology

## 2017-08-03 DIAGNOSIS — C649 Malignant neoplasm of unspecified kidney, except renal pelvis: Secondary | ICD-10-CM

## 2017-08-06 ENCOUNTER — Other Ambulatory Visit: Payer: Self-pay | Admitting: *Deleted

## 2017-08-06 DIAGNOSIS — M6281 Muscle weakness (generalized): Secondary | ICD-10-CM | POA: Diagnosis not present

## 2017-08-06 DIAGNOSIS — R531 Weakness: Secondary | ICD-10-CM | POA: Diagnosis not present

## 2017-08-06 DIAGNOSIS — C649 Malignant neoplasm of unspecified kidney, except renal pelvis: Secondary | ICD-10-CM

## 2017-08-06 MED ORDER — TESTOSTERONE 50 MG/5GM (1%) TD GEL: 7.5000 g | Freq: Every day | TRANSDERMAL | 2 refills | Status: DC

## 2017-08-06 MED ORDER — HYDROCORTISONE 10 MG PO TABS: ORAL_TABLET | ORAL | 3 refills | Status: DC

## 2017-08-07 ENCOUNTER — Other Ambulatory Visit: Payer: Self-pay | Admitting: *Deleted

## 2017-08-07 DIAGNOSIS — M545 Low back pain: Secondary | ICD-10-CM | POA: Diagnosis not present

## 2017-08-07 DIAGNOSIS — C7951 Secondary malignant neoplasm of bone: Secondary | ICD-10-CM | POA: Diagnosis not present

## 2017-08-07 DIAGNOSIS — C649 Malignant neoplasm of unspecified kidney, except renal pelvis: Secondary | ICD-10-CM

## 2017-08-07 DIAGNOSIS — M5416 Radiculopathy, lumbar region: Secondary | ICD-10-CM | POA: Diagnosis not present

## 2017-08-07 MED ORDER — MIRTAZAPINE 30 MG PO TABS: 30.0000 mg | ORAL_TABLET | Freq: Every day | ORAL | 0 refills | Status: DC

## 2017-08-07 NOTE — Addendum Note (Signed)
Addended by: Jethro Bolus A on: 08/07/2017 09:55 AM   Modules accepted: Orders

## 2017-08-09 ENCOUNTER — Inpatient Hospital Stay: Payer: PPO

## 2017-08-09 ENCOUNTER — Inpatient Hospital Stay: Payer: PPO | Attending: Oncology

## 2017-08-09 DIAGNOSIS — Z9221 Personal history of antineoplastic chemotherapy: Secondary | ICD-10-CM | POA: Diagnosis not present

## 2017-08-09 DIAGNOSIS — C649 Malignant neoplasm of unspecified kidney, except renal pelvis: Secondary | ICD-10-CM

## 2017-08-09 DIAGNOSIS — Z95828 Presence of other vascular implants and grafts: Secondary | ICD-10-CM

## 2017-08-09 DIAGNOSIS — Z452 Encounter for adjustment and management of vascular access device: Secondary | ICD-10-CM | POA: Insufficient documentation

## 2017-08-09 DIAGNOSIS — C7951 Secondary malignant neoplasm of bone: Secondary | ICD-10-CM | POA: Diagnosis not present

## 2017-08-09 DIAGNOSIS — M6281 Muscle weakness (generalized): Secondary | ICD-10-CM | POA: Diagnosis not present

## 2017-08-09 DIAGNOSIS — R531 Weakness: Secondary | ICD-10-CM | POA: Diagnosis not present

## 2017-08-09 LAB — COMPREHENSIVE METABOLIC PANEL
ALT: 48 U/L (ref 0–55)
AST: 26 U/L (ref 5–34)
Albumin: 3.8 g/dL (ref 3.5–5.0)
Alkaline Phosphatase: 62 U/L (ref 40–150)
Anion gap: 10 (ref 3–11)
BUN: 17 mg/dL (ref 7–26)
CO2: 27 mmol/L (ref 22–29)
Calcium: 9.7 mg/dL (ref 8.4–10.4)
Chloride: 102 mmol/L (ref 98–109)
Creatinine, Ser: 0.99 mg/dL (ref 0.70–1.30)
GFR calc Af Amer: >60 mL/min (ref >60)
GFR calc non Af Amer: >60 mL/min (ref >60)
Glucose, Bld: 159 mg/dL — ABNORMAL HIGH (ref 70–140)
Potassium: 4.3 mmol/L (ref 3.5–5.1)
Sodium: 139 mmol/L (ref 136–145)
Total Bilirubin: 0.3 mg/dL (ref 0.2–1.2)
Total Protein: 6.6 g/dL (ref 6.4–8.3)

## 2017-08-09 LAB — CBC
HCT: 43.2 % (ref 38.4–49.9)
Hemoglobin: 14.2 g/dL (ref 13.0–17.1)
MCH: 31.2 pg (ref 27.2–33.4)
MCHC: 32.9 g/dL (ref 32.0–36.0)
MCV: 94.9 fL (ref 79.3–98.0)
Platelets: 189 10*3/uL (ref 140–400)
RBC: 4.55 MIL/uL (ref 4.20–5.82)
RDW: 14 % (ref 11.0–14.6)
WBC: 10.7 10*3/uL — ABNORMAL HIGH (ref 4.0–10.3)

## 2017-08-09 MED ORDER — SODIUM CHLORIDE 0.9% FLUSH
10.0000 mL | Freq: Once | INTRAVENOUS | Status: AC
  Administered 2017-08-09: 10 mL
  Filled 2017-08-09: qty 10

## 2017-08-09 MED ORDER — HEPARIN SOD (PORK) LOCK FLUSH 100 UNIT/ML IV SOLN
250.0000 [IU] | Freq: Once | INTRAVENOUS | Status: AC
  Administered 2017-08-09: 250 [IU]
  Filled 2017-08-09: qty 5

## 2017-08-20 ENCOUNTER — Other Ambulatory Visit: Payer: Self-pay | Admitting: Oncology

## 2017-08-20 DIAGNOSIS — C649 Malignant neoplasm of unspecified kidney, except renal pelvis: Secondary | ICD-10-CM

## 2017-08-23 MED FILL — CABOMETYX 40 MG TABLET: 40 | 30 days supply | Qty: 30 | Fill #0

## 2017-08-25 MED FILL — OxyCONTIN 10 MG T12A: 10 | 30 days supply | Qty: 90 | Fill #0

## 2017-08-27 DIAGNOSIS — M6281 Muscle weakness (generalized): Secondary | ICD-10-CM | POA: Diagnosis not present

## 2017-08-27 DIAGNOSIS — R531 Weakness: Secondary | ICD-10-CM | POA: Diagnosis not present

## 2017-09-02 DIAGNOSIS — R531 Weakness: Secondary | ICD-10-CM | POA: Diagnosis not present

## 2017-09-02 DIAGNOSIS — M6281 Muscle weakness (generalized): Secondary | ICD-10-CM | POA: Diagnosis not present

## 2017-09-05 DIAGNOSIS — R531 Weakness: Secondary | ICD-10-CM | POA: Diagnosis not present

## 2017-09-05 DIAGNOSIS — M6281 Muscle weakness (generalized): Secondary | ICD-10-CM | POA: Diagnosis not present

## 2017-09-09 ENCOUNTER — Inpatient Hospital Stay: Payer: PPO | Attending: Oncology

## 2017-09-09 ENCOUNTER — Inpatient Hospital Stay (HOSPITAL_BASED_OUTPATIENT_CLINIC_OR_DEPARTMENT_OTHER): Payer: PPO | Admitting: Oncology

## 2017-09-09 ENCOUNTER — Telehealth: Payer: Self-pay

## 2017-09-09 ENCOUNTER — Inpatient Hospital Stay: Payer: PPO

## 2017-09-09 VITALS — BP 145/75 | HR 59 | Temp 98.4°F | Resp 18 | Ht 70.0 in | Wt 268.4 lb

## 2017-09-09 DIAGNOSIS — I10 Essential (primary) hypertension: Secondary | ICD-10-CM

## 2017-09-09 DIAGNOSIS — Z9225 Personal history of immunosupression therapy: Secondary | ICD-10-CM | POA: Diagnosis not present

## 2017-09-09 DIAGNOSIS — L989 Disorder of the skin and subcutaneous tissue, unspecified: Secondary | ICD-10-CM | POA: Diagnosis not present

## 2017-09-09 DIAGNOSIS — Z9221 Personal history of antineoplastic chemotherapy: Secondary | ICD-10-CM | POA: Diagnosis not present

## 2017-09-09 DIAGNOSIS — R918 Other nonspecific abnormal finding of lung field: Secondary | ICD-10-CM | POA: Insufficient documentation

## 2017-09-09 DIAGNOSIS — C7951 Secondary malignant neoplasm of bone: Secondary | ICD-10-CM | POA: Diagnosis not present

## 2017-09-09 DIAGNOSIS — R439 Unspecified disturbances of smell and taste: Secondary | ICD-10-CM | POA: Diagnosis not present

## 2017-09-09 DIAGNOSIS — R197 Diarrhea, unspecified: Secondary | ICD-10-CM | POA: Insufficient documentation

## 2017-09-09 DIAGNOSIS — Z95828 Presence of other vascular implants and grafts: Secondary | ICD-10-CM

## 2017-09-09 DIAGNOSIS — C649 Malignant neoplasm of unspecified kidney, except renal pelvis: Secondary | ICD-10-CM | POA: Diagnosis not present

## 2017-09-09 DIAGNOSIS — Z79899 Other long term (current) drug therapy: Secondary | ICD-10-CM | POA: Insufficient documentation

## 2017-09-09 LAB — COMPREHENSIVE METABOLIC PANEL
ALT: 78 U/L — ABNORMAL HIGH (ref 0–44)
AST: 33 U/L (ref 15–41)
Albumin: 3.8 g/dL (ref 3.5–5.0)
Alkaline Phosphatase: 64 U/L (ref 38–126)
Anion gap: 8 (ref 5–15)
BUN: 16 mg/dL (ref 8–23)
CO2: 29 mmol/L (ref 22–32)
Calcium: 9.2 mg/dL (ref 8.9–10.3)
Chloride: 102 mmol/L (ref 98–111)
Creatinine, Ser: 0.93 mg/dL (ref 0.61–1.24)
GFR calc Af Amer: >60 mL/min (ref >60)
GFR calc non Af Amer: >60 mL/min (ref >60)
Glucose, Bld: 170 mg/dL — ABNORMAL HIGH (ref 70–99)
Potassium: 4.2 mmol/L (ref 3.5–5.1)
Sodium: 139 mmol/L (ref 135–145)
Total Bilirubin: 0.3 mg/dL (ref 0.3–1.2)
Total Protein: 6.7 g/dL (ref 6.5–8.1)

## 2017-09-09 LAB — CBC WITH DIFFERENTIAL (CANCER CENTER ONLY)
Basophils Absolute: 0 10*3/uL (ref 0.0–0.1)
Basophils Relative: 0 %
Eosinophils Absolute: 1 10*3/uL — ABNORMAL HIGH (ref 0.0–0.5)
Eosinophils Relative: 11 %
HCT: 43.4 % (ref 38.4–49.9)
Hemoglobin: 14.3 g/dL (ref 13.0–17.1)
Lymphocytes Relative: 28 %
Lymphs Abs: 2.4 10*3/uL (ref 0.9–3.3)
MCH: 30.8 pg (ref 27.2–33.4)
MCHC: 32.9 g/dL (ref 32.0–36.0)
MCV: 93.5 fL (ref 79.3–98.0)
Monocytes Absolute: 0.6 10*3/uL (ref 0.1–0.9)
Monocytes Relative: 7 %
Neutro Abs: 4.7 10*3/uL (ref 1.5–6.5)
Neutrophils Relative %: 54 %
Platelet Count: 189 10*3/uL (ref 140–400)
RBC: 4.64 MIL/uL (ref 4.20–5.82)
RDW: 14.2 % (ref 11.0–14.6)
WBC Count: 8.6 10*3/uL (ref 4.0–10.3)

## 2017-09-09 MED ORDER — SODIUM CHLORIDE 0.9% FLUSH
10.0000 mL | Freq: Once | INTRAVENOUS | Status: AC
  Administered 2017-09-09: 10 mL
  Filled 2017-09-09: qty 10

## 2017-09-09 MED ORDER — HEPARIN SOD (PORK) LOCK FLUSH 100 UNIT/ML IV SOLN
500.0000 [IU] | Freq: Once | INTRAVENOUS | Status: AC
  Administered 2017-09-09: 500 [IU]
  Filled 2017-09-09: qty 5

## 2017-09-09 NOTE — Telephone Encounter (Signed)
Printed avs and calender of upcoming appointment. Per 7/15 los gave patient contrast and instructions per 7/15 los

## 2017-09-09 NOTE — Progress Notes (Signed)
[place] OFFICE PROGRESS NOTE   Diagnosis: Renal cell carcinoma  INTERVAL HISTORY:   Phillip [name] returns as scheduled.  He continues cabozantinib.  He reports altered taste for food.  He is eating.  Occasional diarrhea.  His pain has improved.  He is ambulating and working with physical therapy.  He was able to take a trip to [facility].  The nodular lesion at the left forearm has decreased in size.  Objective:  Vital signs in last 24 hours:  Blood pressure (!) 145/75, pulse (!) 59, temperature 98.4 F (36.9 C), temperature source Oral, resp. rate 18, height 5\' 10"  (1.778 m), weight 268 lb 6.4 oz (121.7 kg), SpO2 98 %.    HEENT: No thrush or ulcers Resp: Lungs clear bilaterally Cardio: Regular rate and rhythm GI: Nontender, no hepatosplenomegaly no mass Vascular: No leg edema  Skin: Approximate 4 mm raised lesion at the left forearm, acne type rash of the upper anterior chest  Portacath/PICC-without erythema  Lab Results:  Lab Results  Component Value Date   WBC 10.7 (H) 08/09/2017   HGB 14.2 08/09/2017   HCT 43.2 08/09/2017   MCV 94.9 08/09/2017   PLT 189 08/09/2017   NEUTROABS 3.9 07/15/2017    CMP  Lab Results  Component Value Date   NA 139 08/09/2017   K 4.3 08/09/2017   CL 102 08/09/2017   CO2 27 08/09/2017   GLUCOSE 159 (H) 08/09/2017   BUN 17 08/09/2017   CREATININE 0.99 08/09/2017   CALCIUM 9.7 08/09/2017   PROT 6.6 08/09/2017   ALBUMIN 3.8 08/09/2017   AST 26 08/09/2017   ALT 48 08/09/2017   ALKPHOS 62 08/09/2017   BILITOT 0.3 08/09/2017   GFRNONAA >60 08/09/2017   GFRAA >60 08/09/2017     Medications: I have reviewed the patient's current medications.   Assessment/Plan:  1. Metastatic renal cell carcinoma   L4 mass with extraosseous extension, L4 nerve compression  Biopsy of the L4 mass 11/18/2015 confirmed metastatic renal cell carcinoma, clear cell type  CTs of the chest, abdomen, and pelvis 11/18/2015-right  lower lobe nodule, expansile lytic lesion at the right 11th rib/costal vertebral junction, left renal mass, expansile lesion involving the L4 vertebra, lytic lesion at the left acetabulum, and a low-attenuation liver lesion  Initiation of SRS to L4 12/02/2015, Completed 12/12/2015  Initiation of Pazopanib11/04/2015  Pazopanibplaced on hold 02/06/2016 secondary to elevated liver enzymes  Pazopanibresumed 03/07/2016 at a dose of 400 mg daily  Pazopanibdiscontinued 03/19/2016 secondary to elevated liver enzymes  Restaging CTs 04/02/2016-stable left renal mass, decreased soft tissue component associated with the L4 metastasis, increased soft tissue component associated with the right 11th rib metastasis with increased T11 bony destruction, increased sclerosis at the left acetabulum lesion  Cycle 1 nivolumab 04/12/2016  Cycle 2 nivolumab 04/26/2016  Cycle 3 nivolumab03/16/2018  Cycle 4 nivolumab 05/24/2016  Cycle 5 nivolumab 06/08/2016  MRI lumbar spine 06/21/2016-unchanged tumor at L3, increased size of retroperitoneal lymph nodes compared to a CT from 04/02/2016  Cycle 6 nivolumab 06/22/2016  CTs chest, abdomen, and pelvis 07/04/2016-enlargement of the left renal mass, right adrenal nodule, left hilar and peritoneal lymph nodes, enlargement of left acetabular lesion. Stable lung nodules.  Cycle 7 nivolumab05/12/2016   Cycle 8 nivolumab 07/20/2016  Cycle 9 nivolumab 08/02/2016  Cycle 10 nivolumab 08/20/2016  Restaging CT 09/03/2016 evaluation with stable disease  Cycle 11 nivolumab 09/05/2016  Cycle 12 nivolumab 09/19/2016  Cycle 13 nivolumab 10/03/2016  Cycle 14 nivolumab 10/17/2016  Cycle  15 nivolumab 10/31/2016  Cycle 16 nivolumab 11/14/2016 (changed to monthly schedule)  Cycle 17nivolumab10/24/2018  CTs 01/21/2017-increased left renal mass, increased size of adrenal metastases, increased lytic bone lesions, increased left lung nodule, persistent tumor  at L4 with probable epidural component  Initiation of Cabozantinib12/04/2016  Restaging CTs 05/30/2017- decreased size of left hilar mass, left renal mass, retroperitoneal adenopathy, and adrenal metastasis. Healing bone lesions.  Cabozantinib continued 2. Pain secondary to #1-managed by Dr. Lovenia Shuck.Improved Hypertension 3. Elevated transaminases 02/06/2016-Pazopanibplaced on hold  Liver enzymes normal 03/07/2016 5. Port-A-Cath placement 05/16/2016 6. Malaise/anorexia 09/05/2016. Cortisol and testosterone levels low. Hydrocortisone and testosterone replacement initiated. 7. Conjunctival/scleral erythema 09/19/2016-resolved with steroid eyedrops 8. 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 9. Hypercalcemia status post Zometa 01/23/2017-resolved   Disposition: Phillip [name] appears well.  He will continue cabozantinib.  He will undergo restaging CTs prior to a follow-up visit in 6 weeks.  The nodular lesion at the left forearm has decreased in size.  This may be a skin cancer responding to cabozantinib. [name] will decrease the nighttime dose of OxyContin to 10 mg as tolerated.  15 minutes were spent with the patient today.  The majority of the time was used for counseling and coordination of care.  Betsy Coder, MD  09/09/2017  12:50 PM

## 2017-09-13 ENCOUNTER — Other Ambulatory Visit: Payer: Self-pay | Admitting: Nurse Practitioner

## 2017-09-13 ENCOUNTER — Other Ambulatory Visit: Payer: Self-pay | Admitting: Oncology

## 2017-09-13 DIAGNOSIS — M6281 Muscle weakness (generalized): Secondary | ICD-10-CM | POA: Diagnosis not present

## 2017-09-13 DIAGNOSIS — C649 Malignant neoplasm of unspecified kidney, except renal pelvis: Secondary | ICD-10-CM

## 2017-09-13 DIAGNOSIS — R531 Weakness: Secondary | ICD-10-CM | POA: Diagnosis not present

## 2017-09-16 DIAGNOSIS — M6281 Muscle weakness (generalized): Secondary | ICD-10-CM | POA: Diagnosis not present

## 2017-09-16 DIAGNOSIS — R531 Weakness: Secondary | ICD-10-CM | POA: Diagnosis not present

## 2017-09-18 MED FILL — CABOMETYX 40 MG TABLET: 40 | 30 days supply | Qty: 30 | Fill #0

## 2017-09-19 ENCOUNTER — Other Ambulatory Visit: Payer: Self-pay

## 2017-09-19 DIAGNOSIS — M6281 Muscle weakness (generalized): Secondary | ICD-10-CM | POA: Diagnosis not present

## 2017-09-19 DIAGNOSIS — C649 Malignant neoplasm of unspecified kidney, except renal pelvis: Secondary | ICD-10-CM

## 2017-09-19 DIAGNOSIS — R531 Weakness: Secondary | ICD-10-CM | POA: Diagnosis not present

## 2017-09-20 ENCOUNTER — Encounter: Payer: Self-pay | Admitting: Oncology

## 2017-09-20 ENCOUNTER — Telehealth: Payer: Self-pay | Admitting: Emergency Medicine

## 2017-09-20 NOTE — Telephone Encounter (Signed)
Pt spouse called asking for the PA to be done on pts Testosterone cream. Pt states she has called multiple times in the past week trying to get the prior auth completed with no success. Prior auth form taken to [facility] [place] who will complete it. Patient spouse made aware of this and verbalized understanding .

## 2017-09-20 NOTE — Progress Notes (Signed)
Raquel Sarna RN came to have PA completed for Testosterone 50 mg due to [place] being out.  Reviewed notes regarding previous PA approval for same medication. Attempted to submit via Cover My Meds but form was not available. Called [facility] to have form faxed. Darlena gave form to RN to complete and fax back to [employer] for determination.

## 2017-09-23 ENCOUNTER — Encounter: Payer: Self-pay | Admitting: Oncology

## 2017-09-23 DIAGNOSIS — R531 Weakness: Secondary | ICD-10-CM | POA: Diagnosis not present

## 2017-09-23 DIAGNOSIS — M6281 Muscle weakness (generalized): Secondary | ICD-10-CM | POA: Diagnosis not present

## 2017-09-23 NOTE — Progress Notes (Signed)
Received PA determination from [employer] for Testosterone 50 mg gel.   PA approved 09/20/17-02/25/18.

## 2017-09-24 MED FILL — OxyCONTIN 10 MG T12A: 10 | 30 days supply | Qty: 90 | Fill #0

## 2017-09-26 DIAGNOSIS — R531 Weakness: Secondary | ICD-10-CM | POA: Diagnosis not present

## 2017-09-26 DIAGNOSIS — M6281 Muscle weakness (generalized): Secondary | ICD-10-CM | POA: Diagnosis not present

## 2017-09-30 ENCOUNTER — Other Ambulatory Visit: Payer: Self-pay | Admitting: Oncology

## 2017-09-30 DIAGNOSIS — R531 Weakness: Secondary | ICD-10-CM | POA: Diagnosis not present

## 2017-09-30 DIAGNOSIS — M6281 Muscle weakness (generalized): Secondary | ICD-10-CM | POA: Diagnosis not present

## 2017-09-30 DIAGNOSIS — C649 Malignant neoplasm of unspecified kidney, except renal pelvis: Secondary | ICD-10-CM

## 2017-10-03 DIAGNOSIS — M6281 Muscle weakness (generalized): Secondary | ICD-10-CM | POA: Diagnosis not present

## 2017-10-03 DIAGNOSIS — R531 Weakness: Secondary | ICD-10-CM | POA: Diagnosis not present

## 2017-10-07 DIAGNOSIS — R531 Weakness: Secondary | ICD-10-CM | POA: Diagnosis not present

## 2017-10-07 DIAGNOSIS — M6281 Muscle weakness (generalized): Secondary | ICD-10-CM | POA: Diagnosis not present

## 2017-10-09 ENCOUNTER — Other Ambulatory Visit: Payer: Self-pay | Admitting: Oncology

## 2017-10-09 DIAGNOSIS — C649 Malignant neoplasm of unspecified kidney, except renal pelvis: Secondary | ICD-10-CM

## 2017-10-11 DIAGNOSIS — M6281 Muscle weakness (generalized): Secondary | ICD-10-CM | POA: Diagnosis not present

## 2017-10-11 DIAGNOSIS — R531 Weakness: Secondary | ICD-10-CM | POA: Diagnosis not present

## 2017-10-14 DIAGNOSIS — M6281 Muscle weakness (generalized): Secondary | ICD-10-CM | POA: Diagnosis not present

## 2017-10-14 DIAGNOSIS — R531 Weakness: Secondary | ICD-10-CM | POA: Diagnosis not present

## 2017-10-21 ENCOUNTER — Inpatient Hospital Stay: Payer: PPO

## 2017-10-21 ENCOUNTER — Ambulatory Visit (HOSPITAL_COMMUNITY)
Admission: RE | Admit: 2017-10-21 | Discharge: 2017-10-21 | Disposition: A | Discharge status: Home / Self Care | Payer: PPO | Source: Ambulatory Visit | Attending: Oncology | Admitting: Oncology

## 2017-10-21 ENCOUNTER — Inpatient Hospital Stay: Payer: PPO | Attending: Oncology

## 2017-10-21 DIAGNOSIS — Z9221 Personal history of antineoplastic chemotherapy: Secondary | ICD-10-CM | POA: Diagnosis not present

## 2017-10-21 DIAGNOSIS — Z79899 Other long term (current) drug therapy: Secondary | ICD-10-CM | POA: Diagnosis not present

## 2017-10-21 DIAGNOSIS — Z95828 Presence of other vascular implants and grafts: Secondary | ICD-10-CM

## 2017-10-21 DIAGNOSIS — R918 Other nonspecific abnormal finding of lung field: Secondary | ICD-10-CM | POA: Diagnosis not present

## 2017-10-21 DIAGNOSIS — I700 Atherosclerosis of aorta: Secondary | ICD-10-CM | POA: Diagnosis not present

## 2017-10-21 DIAGNOSIS — C649 Malignant neoplasm of unspecified kidney, except renal pelvis: Secondary | ICD-10-CM | POA: Insufficient documentation

## 2017-10-21 DIAGNOSIS — Z9225 Personal history of immunosupression therapy: Secondary | ICD-10-CM | POA: Diagnosis not present

## 2017-10-21 DIAGNOSIS — K769 Liver disease, unspecified: Secondary | ICD-10-CM | POA: Insufficient documentation

## 2017-10-21 DIAGNOSIS — L298 Other pruritus: Secondary | ICD-10-CM | POA: Diagnosis not present

## 2017-10-21 DIAGNOSIS — E278 Other specified disorders of adrenal gland: Secondary | ICD-10-CM | POA: Insufficient documentation

## 2017-10-21 DIAGNOSIS — C7951 Secondary malignant neoplasm of bone: Secondary | ICD-10-CM | POA: Diagnosis not present

## 2017-10-21 DIAGNOSIS — R590 Localized enlarged lymph nodes: Secondary | ICD-10-CM | POA: Insufficient documentation

## 2017-10-21 DIAGNOSIS — N400 Enlarged prostate without lower urinary tract symptoms: Secondary | ICD-10-CM | POA: Insufficient documentation

## 2017-10-21 DIAGNOSIS — Z5112 Encounter for antineoplastic immunotherapy: Secondary | ICD-10-CM | POA: Diagnosis not present

## 2017-10-21 DIAGNOSIS — R21 Rash and other nonspecific skin eruption: Secondary | ICD-10-CM | POA: Diagnosis not present

## 2017-10-21 LAB — CBC WITH DIFFERENTIAL (CANCER CENTER ONLY)
Basophils Absolute: 0.1 10*3/uL (ref 0.0–0.1)
Basophils Relative: 1 %
Eosinophils Absolute: 1.9 10*3/uL — ABNORMAL HIGH (ref 0.0–0.5)
Eosinophils Relative: 20 %
HCT: 40.6 % (ref 38.4–49.9)
Hemoglobin: 13.5 g/dL (ref 13.0–17.1)
Lymphocytes Relative: 20 %
Lymphs Abs: 1.9 10*3/uL (ref 0.9–3.3)
MCH: 30.5 pg (ref 27.2–33.4)
MCHC: 33.3 g/dL (ref 32.0–36.0)
MCV: 91.5 fL (ref 79.3–98.0)
Monocytes Absolute: 0.5 10*3/uL (ref 0.1–0.9)
Monocytes Relative: 5 %
Neutro Abs: 5 10*3/uL (ref 1.5–6.5)
Neutrophils Relative %: 54 %
Platelet Count: 204 10*3/uL (ref 140–400)
RBC: 4.44 MIL/uL (ref 4.20–5.82)
RDW: 14.6 % (ref 11.0–14.6)
WBC Count: 9.4 10*3/uL (ref 4.0–10.3)

## 2017-10-21 LAB — CMP (CANCER CENTER ONLY)
ALT: 59 U/L — ABNORMAL HIGH (ref 0–44)
AST: 29 U/L (ref 15–41)
Albumin: 3.7 g/dL (ref 3.5–5.0)
Alkaline Phosphatase: 62 U/L (ref 38–126)
Anion gap: 7 (ref 5–15)
BUN: 12 mg/dL (ref 8–23)
CO2: 30 mmol/L (ref 22–32)
Calcium: 9.1 mg/dL (ref 8.9–10.3)
Chloride: 102 mmol/L (ref 98–111)
Creatinine: 0.89 mg/dL (ref 0.61–1.24)
GFR, Est AFR Am: >60 mL/min (ref >60)
GFR, Estimated: >60 mL/min (ref >60)
Glucose, Bld: 122 mg/dL — ABNORMAL HIGH (ref 70–99)
Potassium: 4.2 mmol/L (ref 3.5–5.1)
Sodium: 139 mmol/L (ref 135–145)
Total Bilirubin: 0.3 mg/dL (ref 0.3–1.2)
Total Protein: 6.6 g/dL (ref 6.5–8.1)

## 2017-10-21 MED ORDER — IOHEXOL 300 MG/ML  SOLN
100.0000 mL | Freq: Once | INTRAMUSCULAR | Status: AC | PRN
  Administered 2017-10-21: 100 mL via INTRAVENOUS

## 2017-10-21 MED ORDER — SODIUM CHLORIDE 0.9% FLUSH
10.0000 mL | Freq: Once | INTRAVENOUS | Status: AC
  Administered 2017-10-21: 10 mL
  Filled 2017-10-21: qty 10

## 2017-10-21 MED ORDER — HEPARIN SOD (PORK) LOCK FLUSH 100 UNIT/ML IV SOLN
INTRAVENOUS | Status: AC
  Filled 2017-10-21: qty 5

## 2017-10-21 MED ORDER — HEPARIN SOD (PORK) LOCK FLUSH 100 UNIT/ML IV SOLN
500.0000 [IU] | Freq: Once | INTRAVENOUS | Status: AC
  Administered 2017-10-21: 500 [IU] via INTRAVENOUS

## 2017-10-21 NOTE — Patient Instructions (Signed)
Implanted Port Home Guide An implanted port is a type of central line that is placed under the skin. Central lines are used to provide IV access when treatment or nutrition needs to be given through a person's veins. Implanted ports are used for long-term IV access. An implanted port may be placed because:  You need IV medicine that would be irritating to the small veins in your hands or arms.  You need long-term IV medicines, such as antibiotics.  You need IV nutrition for a long period.  You need frequent blood draws for lab tests.  You need dialysis.  Implanted ports are usually placed in the chest area, but they can also be placed in the upper arm, the abdomen, or the leg. An implanted port has two main parts:  Reservoir. The reservoir is round and will appear as a small, raised area under your skin. The reservoir is the part where a needle is inserted to give medicines or draw blood.  Catheter. The catheter is a thin, flexible tube that extends from the reservoir. The catheter is placed into a large vein. Medicine that is inserted into the reservoir goes into the catheter and then into the vein.  How will I care for my incision site? Do not get the incision site wet. Bathe or shower as directed by your health care provider. How is my port accessed? Special steps must be taken to access the port:  Before the port is accessed, a numbing cream can be placed on the skin. This helps numb the skin over the port site.  Your health care provider uses a sterile technique to access the port. ? Your health care provider must put on a mask and sterile gloves. ? The skin over your port is cleaned carefully with an antiseptic and allowed to dry. ? The port is gently pinched between sterile gloves, and a needle is inserted into the port.  Only "non-coring" port needles should be used to access the port. Once the port is accessed, a blood return should be checked. This helps ensure that the port  is in the vein and is not clogged.  If your port needs to remain accessed for a constant infusion, a clear (transparent) bandage will be placed over the needle site. The bandage and needle will need to be changed every week, or as directed by your health care provider.  Keep the bandage covering the needle clean and dry. Do not get it wet. Follow your health care provider's instructions on how to take a shower or bath while the port is accessed.  If your port does not need to stay accessed, no bandage is needed over the port.  What is flushing? Flushing helps keep the port from getting clogged. Follow your health care provider's instructions on how and when to flush the port. Ports are usually flushed with saline solution or a medicine called heparin. The need for flushing will depend on how the port is used.  If the port is used for intermittent medicines or blood draws, the port will need to be flushed: ? After medicines have been given. ? After blood has been drawn. ? As part of routine maintenance.  If a constant infusion is running, the port may not need to be flushed.  How long will my port stay implanted? The port can stay in for as long as your health care provider thinks it is needed. When it is time for the port to come out, surgery will be   done to remove it. The procedure is similar to the one performed when the port was put in. When should I seek immediate medical care? When you have an implanted port, you should seek immediate medical care if:  You notice a bad smell coming from the incision site.  You have swelling, redness, or drainage at the incision site.  You have more swelling or pain at the port site or the surrounding area.  You have a fever that is not controlled with medicine.  This information is not intended to replace advice given to you by your health care provider. Make sure you discuss any questions you have with your health care provider. Document  Released: 02/12/2005 Document Revised: 07/21/2015 Document Reviewed: 10/20/2012 Elsevier Interactive Patient Education  2017 [employer]  

## 2017-10-23 ENCOUNTER — Encounter: Payer: Self-pay | Admitting: Nurse Practitioner

## 2017-10-23 ENCOUNTER — Telehealth: Payer: Self-pay

## 2017-10-23 ENCOUNTER — Inpatient Hospital Stay (HOSPITAL_BASED_OUTPATIENT_CLINIC_OR_DEPARTMENT_OTHER): Payer: PPO | Admitting: Nurse Practitioner

## 2017-10-23 VITALS — BP 138/56 | HR 63 | Temp 98.6°F | Resp 17 | Ht 70.0 in | Wt 275.0 lb

## 2017-10-23 DIAGNOSIS — R918 Other nonspecific abnormal finding of lung field: Secondary | ICD-10-CM | POA: Diagnosis not present

## 2017-10-23 DIAGNOSIS — K769 Liver disease, unspecified: Secondary | ICD-10-CM

## 2017-10-23 DIAGNOSIS — Z79899 Other long term (current) drug therapy: Secondary | ICD-10-CM

## 2017-10-23 DIAGNOSIS — C649 Malignant neoplasm of unspecified kidney, except renal pelvis: Secondary | ICD-10-CM | POA: Diagnosis not present

## 2017-10-23 DIAGNOSIS — C7951 Secondary malignant neoplasm of bone: Secondary | ICD-10-CM | POA: Diagnosis not present

## 2017-10-23 DIAGNOSIS — Z9221 Personal history of antineoplastic chemotherapy: Secondary | ICD-10-CM

## 2017-10-23 DIAGNOSIS — N400 Enlarged prostate without lower urinary tract symptoms: Secondary | ICD-10-CM | POA: Diagnosis not present

## 2017-10-23 DIAGNOSIS — Z9225 Personal history of immunosupression therapy: Secondary | ICD-10-CM

## 2017-10-23 DIAGNOSIS — I700 Atherosclerosis of aorta: Secondary | ICD-10-CM | POA: Diagnosis not present

## 2017-10-23 DIAGNOSIS — R21 Rash and other nonspecific skin eruption: Secondary | ICD-10-CM

## 2017-10-23 DIAGNOSIS — L298 Other pruritus: Secondary | ICD-10-CM

## 2017-10-23 MED ORDER — METHYLPREDNISOLONE 4 MG PO TBPK: ORAL_TABLET | ORAL | 0 refills | Status: DC

## 2017-10-23 MED ORDER — TESTOSTERONE 50 MG/5GM (1%) TD GEL: 7.5000 g | Freq: Every day | TRANSDERMAL | 2 refills | Status: DC

## 2017-10-23 MED FILL — METHYLPREDNISOLONE 4 MG TAB: 4 | 6 days supply | Qty: 21 | Fill #0

## 2017-10-23 NOTE — Telephone Encounter (Signed)
Per 8/28 los changes was made with [name] okay due to patient going out of town for 2 weeks which delayed patient return visit los date.

## 2017-10-23 NOTE — Progress Notes (Addendum)
[place] OFFICE PROGRESS NOTE   Diagnosis: Renal cell carcinoma  INTERVAL HISTORY:   Mr. Semper returns as scheduled.  He continues cabozantinib.  He overall feels well.  Appetite is unchanged.  Weight is stable.  He continues to have leg pain.  He is taking OxyContin 10 mg every morning and 20 mg every afternoon.  No significant dyspnea.  He has occasional diarrhea.  No significant nausea.  Following the CT scan Monday he developed a pruritic rash.  He noted no other symptoms.  Objective:  Vital signs in last 24 hours:  Blood pressure (!) 138/56, pulse 63, temperature 98.6 F (37 C), temperature source Oral, resp. rate 17, height 5\' 10"  (1.778 m), weight 275 lb (124.7 kg), SpO2 97 %.    HEENT: No thrush or ulcers. Resp: Lungs clear bilaterally. Cardio: Regular rate and rhythm. GI: Abdomen soft and nontender.  No hepatomegaly. Vascular: No leg edema. Skin: Acne type rash upper anterior chest.  Erythematous rash over the trunk and upper extremities.  Approximate 4 mm nodular lesion left forearm. Port-A-Cath without erythema.  Lab Results:  Lab Results  Component Value Date   WBC 9.4 10/21/2017   HGB 13.5 10/21/2017   HCT 40.6 10/21/2017   MCV 91.5 10/21/2017   PLT 204 10/21/2017   NEUTROABS 5.0 10/21/2017    Imaging:  Ct Chest W Contrast  Result Date: 10/21/2017 CLINICAL DATA:  Renal cell carcinoma with metastatic disease to the L4 vertebral body. Radiation therapy to the spine. Immunotherapy ongoing. EXAM: CT CHEST, ABDOMEN, AND PELVIS WITH CONTRAST TECHNIQUE: Multidetector CT imaging of the chest, abdomen and pelvis was performed following the standard protocol during bolus administration of intravenous contrast. CONTRAST:  110mL OMNIPAQUE IOHEXOL 300 MG/ML  SOLN COMPARISON:  CT 05/30/2017 FINDINGS: CT CHEST FINDINGS Cardiovascular: Port in the anterior chest wall with tip in distal SVC. Coronary artery calcification and aortic atherosclerotic  calcification. Mediastinum/Nodes: Interval enlargement of RIGHT hilar lymph node measuring 19 x 23 mm compared to 13 x 13 mm. Small mediastinal lymph nodes are similar. Lungs/Pleura: No suspicious pulmonary nodules. Musculoskeletal: Expansile lesions in the medial RIGHT ribs again noted (second rib and eleventh rib). CT ABDOMEN AND PELVIS FINDINGS Hepatobiliary: No focal hepatic lesion. No biliary ductal dilatation. Gallbladder is normal. Gallstones noted common bile duct is normal. Pancreas: Pancreas is normal. No ductal dilatation. No pancreatic inflammation. Spleen: Normal spleen Adrenals/urinary tract: Stable mildly nodular adrenal glands. Enhancing irregular lesion in the lateral mid cortex of the LEFT kidney measures 3.1 by 3.2 cm (image 45/8) compared to 3.1 x 3.5 cm. Visually lesion looks identical. No new lesions in LEFT RIGHT kidney. No ureteral obstruction. The superior aspect of the enhancing LEFT renal lesion has a nodular contour and approaches the spleen, but also not changed from comparison exam Stomach/Bowel: Stomach, small bowel, appendix, and cecum are normal. The colon and rectosigmoid colon are normal. Vascular/Lymphatic: Abdominal aorta is normal caliber. There is no retroperitoneal or periportal lymphadenopathy. No pelvic lymphadenopathy. Reproductive: Prostate enlarged measuring 63 mm. Other: No free fluid. Musculoskeletal: Expansile lytic lesion in the L4 vertebral body unchanged. Chronic compression deformity at L1. IMPRESSION: Chest Impression: 1. Interval enlargement LEFT hilar lymph node is a concerning for lymph node metastasis. Recommend close attention on follow-up. 2.   No pulmonary metastasis identified. 3.  Stable expansile rib metastatic lesions Abdomen / Pelvis Impression: 1. Stable enhancing mass in the LEFT renal cortex consistent with renal neoplasm. 2. Stable mildly nodular adrenal glands. 3. Stable expansile lytic  lesions within the pelvis and spine Electronically Signed    By: Suzy Bouchard M.D.   On: 10/21/2017 16:50   Ct Abdomen Pelvis W Contrast  Result Date: 10/21/2017 CLINICAL DATA:  Renal cell carcinoma with metastatic disease to the L4 vertebral body. Radiation therapy to the spine. Immunotherapy ongoing. EXAM: CT CHEST, ABDOMEN, AND PELVIS WITH CONTRAST TECHNIQUE: Multidetector CT imaging of the chest, abdomen and pelvis was performed following the standard protocol during bolus administration of intravenous contrast. CONTRAST:  164mL OMNIPAQUE IOHEXOL 300 MG/ML  SOLN COMPARISON:  CT 05/30/2017 FINDINGS: CT CHEST FINDINGS Cardiovascular: Port in the anterior chest wall with tip in distal SVC. Coronary artery calcification and aortic atherosclerotic calcification. Mediastinum/Nodes: Interval enlargement of RIGHT hilar lymph node measuring 19 x 23 mm compared to 13 x 13 mm. Small mediastinal lymph nodes are similar. Lungs/Pleura: No suspicious pulmonary nodules. Musculoskeletal: Expansile lesions in the medial RIGHT ribs again noted (second rib and eleventh rib). CT ABDOMEN AND PELVIS FINDINGS Hepatobiliary: No focal hepatic lesion. No biliary ductal dilatation. Gallbladder is normal. Gallstones noted common bile duct is normal. Pancreas: Pancreas is normal. No ductal dilatation. No pancreatic inflammation. Spleen: Normal spleen Adrenals/urinary tract: Stable mildly nodular adrenal glands. Enhancing irregular lesion in the lateral mid cortex of the LEFT kidney measures 3.1 by 3.2 cm (image 45/8) compared to 3.1 x 3.5 cm. Visually lesion looks identical. No new lesions in LEFT RIGHT kidney. No ureteral obstruction. The superior aspect of the enhancing LEFT renal lesion has a nodular contour and approaches the spleen, but also not changed from comparison exam Stomach/Bowel: Stomach, small bowel, appendix, and cecum are normal. The colon and rectosigmoid colon are normal. Vascular/Lymphatic: Abdominal aorta is normal caliber. There is no retroperitoneal or periportal  lymphadenopathy. No pelvic lymphadenopathy. Reproductive: Prostate enlarged measuring 63 mm. Other: No free fluid. Musculoskeletal: Expansile lytic lesion in the L4 vertebral body unchanged. Chronic compression deformity at L1. IMPRESSION: Chest Impression: 1. Interval enlargement LEFT hilar lymph node is a concerning for lymph node metastasis. Recommend close attention on follow-up. 2.   No pulmonary metastasis identified. 3.  Stable expansile rib metastatic lesions Abdomen / Pelvis Impression: 1. Stable enhancing mass in the LEFT renal cortex consistent with renal neoplasm. 2. Stable mildly nodular adrenal glands. 3. Stable expansile lytic lesions within the pelvis and spine Electronically Signed   By: Suzy Bouchard M.D.   On: 10/21/2017 16:50    Medications: I have reviewed the patient's current medications.  Assessment/Plan: 1. Metastatic renal cell carcinoma   L4 mass with extraosseous extension, L4 nerve compression  Biopsy of the L4 mass 11/18/2015 confirmed metastatic renal cell carcinoma, clear cell type  CTs of the chest, abdomen, and pelvis 11/18/2015-right lower lobe nodule, expansile lytic lesion at the right 11th rib/costal vertebral junction, left renal mass, expansile lesion involving the L4 vertebra, lytic lesion at the left acetabulum, and a low-attenuation liver lesion  Initiation of SRS to L4 12/02/2015, Completed 12/12/2015  Initiation of Pazopanib11/04/2015  Pazopanibplaced on hold 02/06/2016 secondary to elevated liver enzymes  Pazopanibresumed 03/07/2016 at a dose of 400 mg daily  Pazopanibdiscontinued 03/19/2016 secondary to elevated liver enzymes  Restaging CTs 04/02/2016-stable left renal mass, decreased soft tissue component associated with the L4 metastasis, increased soft tissue component associated with the right 11th rib metastasis with increased T11 bony destruction, increased sclerosis at the left acetabulum lesion  Cycle 1 nivolumab  04/12/2016  Cycle 2 nivolumab 04/26/2016  Cycle 3 nivolumab03/16/2018  Cycle 4 nivolumab 05/24/2016  Cycle 5  nivolumab 06/08/2016  MRI lumbar spine 06/21/2016-unchanged tumor at L3, increased size of retroperitoneal lymph nodes compared to a CT from 04/02/2016  Cycle 6 nivolumab 06/22/2016  CTs chest, abdomen, and pelvis 07/04/2016-enlargement of the left renal mass, right adrenal nodule, left hilar and peritoneal lymph nodes, enlargement of left acetabular lesion. Stable lung nodules.  Cycle 7 nivolumab05/12/2016   Cycle 8 nivolumab 07/20/2016  Cycle 9 nivolumab 08/02/2016  Cycle 10 nivolumab 08/20/2016  Restaging CT 09/03/2016 evaluation with stable disease  Cycle 11 nivolumab 09/05/2016  Cycle 12 nivolumab 09/19/2016  Cycle 13 nivolumab 10/03/2016  Cycle 14 nivolumab 10/17/2016  Cycle 15 nivolumab 10/31/2016  Cycle 16 nivolumab 11/14/2016 (changed to monthly schedule)  Cycle 17nivolumab10/24/2018  CTs 01/21/2017-increased left renal mass, increased size of adrenal metastases, increased lytic bone lesions, increased left lung nodule, persistent tumor at L4 with probable epidural component  Initiation of Cabozantinib12/04/2016  Restaging CTs 05/30/2017-decreased size of left hilar mass, left renal mass, retroperitoneal adenopathy, and adrenal metastasis. Healing bone lesions.  Cabozantinib continued  CTs 10/21/2017- interval enlargement left hilar lymph node; stable rib lesions; stable mass left renal cortex; stable mildly nodular adrenal glands; stable lytic lesions within the pelvis and spine.  Cabozantinib continued 2. Pain secondary to #1-managed by Dr. Lovenia Shuck.Improved 3. Hypertension 3. Elevated transaminases 02/06/2016-Pazopanibplaced on hold  Liver enzymes normal 03/07/2016 5. Port-A-Cath placement 05/16/2016 6. Malaise/anorexia 09/05/2016. Cortisol and testosterone levels low. Hydrocortisone and testosterone replacement  initiated. 7. Conjunctival/scleral erythema 09/19/2016-resolved with steroid eyedrops 8. 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 9. Hypercalcemia status post Zometa 01/23/2017-resolved 10. Pruritic rash following IV contrast 10/21/2017   Disposition: Mr. Arquette appears stable.  The recent restaging CT scans show overall stable disease.  A left hilar lymph node was slightly larger.  The plan is to continue cabozantinib.  He developed a pruritic rash following the CT scan.  We will list CT contrast as an allergy.  A prescription for a Medrol Dosepak was sent to his pharmacy.  Potential side effects associated with steroids were reviewed.  He will return for lab and follow-up in 6 weeks.  He will contact the office in the interim with any problems.  Patient seen with Dr. [name].    [name] ANP/GNP-BC   10/23/2017  12:40 PM This was a shared visit with [name].  Mr. Hemmelgarn was interviewed and examined.  The restaging CTs revealed no evidence of disease progression other than a slightly enlarged hilar lymph node.  We discussed the CT findings with Mr. Rostro.  He is tolerating the cabozantinib well and his clinical status appears unchanged.  He will continue cabozantinib.  He has developed a rash, likely an allergic reaction to the CT contrast 10/21/2017.  He will complete a steroid Dosepak and use Benadryl as needed.  Julieanne Manson, MD

## 2017-10-24 MED FILL — CABOMETYX 40 MG TABLET: 40 | 30 days supply | Qty: 30 | Fill #0

## 2017-10-25 ENCOUNTER — Other Ambulatory Visit: Payer: Self-pay | Admitting: Nurse Practitioner

## 2017-10-25 DIAGNOSIS — C649 Malignant neoplasm of unspecified kidney, except renal pelvis: Secondary | ICD-10-CM

## 2017-10-25 DIAGNOSIS — R531 Weakness: Secondary | ICD-10-CM | POA: Diagnosis not present

## 2017-10-25 DIAGNOSIS — M6281 Muscle weakness (generalized): Secondary | ICD-10-CM | POA: Diagnosis not present

## 2017-10-25 MED ORDER — TESTOSTERONE 50 MG/5GM (1%) TD GEL: TRANSDERMAL | 2 refills | Status: DC

## 2017-10-25 MED FILL — TESTOSTERONE 50 MG/5 GRAM P: 50 MG/5GM | 30 days supply | Qty: 300 | Fill #0

## 2017-10-25 MED FILL — OxyCONTIN 10 MG T12A: 10 | 30 days supply | Qty: 90 | Fill #0

## 2017-11-05 DIAGNOSIS — C7951 Secondary malignant neoplasm of bone: Secondary | ICD-10-CM | POA: Diagnosis not present

## 2017-11-05 DIAGNOSIS — M6281 Muscle weakness (generalized): Secondary | ICD-10-CM | POA: Diagnosis not present

## 2017-11-05 DIAGNOSIS — R531 Weakness: Secondary | ICD-10-CM | POA: Diagnosis not present

## 2017-11-08 ENCOUNTER — Telehealth: Payer: Self-pay | Admitting: Pharmacist

## 2017-11-08 NOTE — Telephone Encounter (Signed)
Oral Chemotherapy Pharmacist Encounter  Follow-Up Form  Spoke with patient's significant other, Phillip [name], today to follow up regarding patient's oral chemotherapy medication: Cabometyx (cabozantinib) for the treatment of previously treated, metastatic renal cell carcinoma, planned duration until disease progression or unacceptable toxicity.  Original Start date of oral chemotherapy: 01/28/17  Phillip [name] states patient is overall doing very well. Rash has resolved after Medrol Dosepak.  Phillip [name] reports 0 tablets/doses of Cabometyx 40mg  tablets, 1 tablet (40mg ) by mouth once daily on an empty stomach, 1 hour before or 2 hours after a meal, missed in the last month.   Phillip [name] reports Phillip [name] is experiencing the following side effects:   Intermittent diarrhea and constipation, this remains manageable  Mild-moderate fatigue, patient is able to perform ADLs and go to social engagements, he just naps most days, this remains manageable  Pertinent labs reviewed: OK for continued treatment.  Phillip [name] expressed appreciation for the call.  They know to call the office with questions or concerns. [facility] will continue to follow.  Phillip [name], PharmD, BCPS, BCOP  11/08/2017 12:56 PM [facility] [phone]

## 2017-11-11 DIAGNOSIS — K6289 Other specified diseases of anus and rectum: Secondary | ICD-10-CM | POA: Diagnosis not present

## 2017-11-11 MED FILL — AMOX-CLAV 875-125 MG TABLET: 875-125 | 10 days supply | Qty: 20 | Fill #0

## 2017-11-12 ENCOUNTER — Telehealth: Payer: Self-pay | Admitting: Oncology

## 2017-11-12 DIAGNOSIS — K610 Anal abscess: Secondary | ICD-10-CM | POA: Diagnosis not present

## 2017-11-12 NOTE — Telephone Encounter (Signed)
TC from Pt's wife to inform Dr. [name] about Pt. Having pain in the rectal area. Pt.'s wife stated that she took Pt. To PCP informed Pt. It could be an abscess and referred him to [facility]. Pt. Was put on Augmentin wife stated she stopped the cabometyx and wanted to know if the antibiotic would interact with the cabometyx. As per Dr.Sherrill and pharmacy Pt. Can continue taking both medications, This was relayed to Pt's wife, she verbalized understanding. Also per wife Pt. Had an abscess and it is draining.

## 2017-11-15 DIAGNOSIS — K610 Anal abscess: Secondary | ICD-10-CM | POA: Diagnosis not present

## 2017-11-18 ENCOUNTER — Other Ambulatory Visit: Payer: Self-pay | Admitting: Oncology

## 2017-11-18 DIAGNOSIS — C649 Malignant neoplasm of unspecified kidney, except renal pelvis: Secondary | ICD-10-CM

## 2017-11-22 MED FILL — OxyCONTIN 10 MG T12A: 10 | 30 days supply | Qty: 90 | Fill #0

## 2017-11-22 MED FILL — CABOMETYX 40 MG TABLET: 40 | 30 days supply | Qty: 30 | Fill #0

## 2017-11-22 MED FILL — TESTOSTERONE 50 MG/5 GRAM P: 50 MG/5GM | 30 days supply | Qty: 300 | Fill #1

## 2017-11-29 DIAGNOSIS — K610 Anal abscess: Secondary | ICD-10-CM | POA: Diagnosis not present

## 2017-12-13 ENCOUNTER — Other Ambulatory Visit: Payer: Self-pay | Admitting: Oncology

## 2017-12-13 DIAGNOSIS — C649 Malignant neoplasm of unspecified kidney, except renal pelvis: Secondary | ICD-10-CM

## 2017-12-16 ENCOUNTER — Telehealth: Payer: Self-pay | Admitting: Oncology

## 2017-12-16 ENCOUNTER — Inpatient Hospital Stay: Payer: PPO

## 2017-12-16 ENCOUNTER — Inpatient Hospital Stay (HOSPITAL_BASED_OUTPATIENT_CLINIC_OR_DEPARTMENT_OTHER): Payer: PPO | Admitting: Oncology

## 2017-12-16 ENCOUNTER — Inpatient Hospital Stay: Payer: PPO | Attending: Oncology

## 2017-12-16 VITALS — BP 123/52 | HR 48 | Temp 98.8°F | Resp 14 | Ht 70.0 in | Wt 268.0 lb

## 2017-12-16 DIAGNOSIS — Z9225 Personal history of immunosupression therapy: Secondary | ICD-10-CM

## 2017-12-16 DIAGNOSIS — C649 Malignant neoplasm of unspecified kidney, except renal pelvis: Secondary | ICD-10-CM

## 2017-12-16 DIAGNOSIS — Z79899 Other long term (current) drug therapy: Secondary | ICD-10-CM | POA: Diagnosis not present

## 2017-12-16 DIAGNOSIS — Z452 Encounter for adjustment and management of vascular access device: Secondary | ICD-10-CM | POA: Insufficient documentation

## 2017-12-16 DIAGNOSIS — C7951 Secondary malignant neoplasm of bone: Secondary | ICD-10-CM

## 2017-12-16 DIAGNOSIS — Z9221 Personal history of antineoplastic chemotherapy: Secondary | ICD-10-CM | POA: Insufficient documentation

## 2017-12-16 DIAGNOSIS — K769 Liver disease, unspecified: Secondary | ICD-10-CM | POA: Diagnosis not present

## 2017-12-16 DIAGNOSIS — I10 Essential (primary) hypertension: Secondary | ICD-10-CM | POA: Diagnosis not present

## 2017-12-16 DIAGNOSIS — C7970 Secondary malignant neoplasm of unspecified adrenal gland: Secondary | ICD-10-CM | POA: Insufficient documentation

## 2017-12-16 DIAGNOSIS — Z95828 Presence of other vascular implants and grafts: Secondary | ICD-10-CM

## 2017-12-16 LAB — CMP (CANCER CENTER ONLY)
ALT: 47 U/L — ABNORMAL HIGH (ref 0–44)
AST: 25 U/L (ref 15–41)
Albumin: 3.6 g/dL (ref 3.5–5.0)
Alkaline Phosphatase: 59 U/L (ref 38–126)
Anion gap: 8 (ref 5–15)
BUN: 13 mg/dL (ref 8–23)
CO2: 29 mmol/L (ref 22–32)
Calcium: 9 mg/dL (ref 8.9–10.3)
Chloride: 103 mmol/L (ref 98–111)
Creatinine: 0.92 mg/dL (ref 0.61–1.24)
GFR, Est AFR Am: >60 mL/min (ref >60)
GFR, Estimated: >60 mL/min (ref >60)
Glucose, Bld: 107 mg/dL — ABNORMAL HIGH (ref 70–99)
Potassium: 4.1 mmol/L (ref 3.5–5.1)
Sodium: 140 mmol/L (ref 135–145)
Total Bilirubin: 0.3 mg/dL (ref 0.3–1.2)
Total Protein: 6.6 g/dL (ref 6.5–8.1)

## 2017-12-16 LAB — CBC WITH DIFFERENTIAL (CANCER CENTER ONLY)
Abs Immature Granulocytes: 0.01 10*3/uL (ref 0.00–0.07)
Basophils Absolute: 0 10*3/uL (ref 0.0–0.1)
Basophils Relative: 0 %
Eosinophils Absolute: 0.5 10*3/uL (ref 0.0–0.5)
Eosinophils Relative: 8 %
HCT: 40.7 % (ref 39.0–52.0)
Hemoglobin: 13 g/dL (ref 13.0–17.0)
Immature Granulocytes: 0 %
Lymphocytes Relative: 25 %
Lymphs Abs: 1.8 10*3/uL (ref 0.7–4.0)
MCH: 29.5 pg (ref 26.0–34.0)
MCHC: 31.9 g/dL (ref 30.0–36.0)
MCV: 92.3 fL (ref 80.0–100.0)
Monocytes Absolute: 0.5 10*3/uL (ref 0.1–1.0)
Monocytes Relative: 7 %
Neutro Abs: 4.2 10*3/uL (ref 1.7–7.7)
Neutrophils Relative %: 60 %
Platelet Count: 179 10*3/uL (ref 150–400)
RBC: 4.41 MIL/uL (ref 4.22–5.81)
RDW: 14.6 % (ref 11.5–15.5)
WBC Count: 7 10*3/uL (ref 4.0–10.5)
nRBC: 0 % (ref 0.0–0.2)

## 2017-12-16 MED ORDER — SODIUM CHLORIDE 0.9% FLUSH
10.0000 mL | Freq: Once | INTRAVENOUS | Status: AC
  Administered 2017-12-16: 10 mL
  Filled 2017-12-16: qty 10

## 2017-12-16 MED ORDER — ZOLEDRONIC ACID 4 MG/100ML IV SOLN: 4.0000 mg | Freq: Once | INTRAVENOUS | Status: DC

## 2017-12-16 MED ORDER — HEPARIN SOD (PORK) LOCK FLUSH 100 UNIT/ML IV SOLN
250.0000 [IU] | Freq: Once | INTRAVENOUS | Status: AC
  Administered 2017-12-16: 250 [IU]
  Filled 2017-12-16: qty 5

## 2017-12-16 NOTE — Telephone Encounter (Signed)
Appts scheduled avs/calendar printed per 10/21 los °

## 2017-12-16 NOTE — Progress Notes (Signed)
[place] OFFICE PROGRESS NOTE   Diagnosis: Renal cell carcinoma  INTERVAL HISTORY:   Phillip [name] returns for a scheduled visit.  He continues cabozantinib.  He feels well.  He recently returned from a fishing trip to [place].  He is ambulating with a walker.  He reports good pain control with OxyContin twice daily.  He is not taking breakthrough pain medication. He developed pain at the "rectum "last month.  Treated for a perirectal abscess by surgery.  The abscess drained and the pain has resolved.  Objective:  Vital signs in last 24 hours:  Blood pressure (!) 123/52, pulse (!) 48, temperature 98.8 F (37.1 C), temperature source Oral, resp. rate 14, height 5\' 10"  (1.778 m), weight 268 lb (121.6 kg), SpO2 97 %.    HEENT: No thrush or ulcers Resp: Lungs clear bilaterally Cardio: Regular rate and rhythm GI: No hepatosplenomegaly, nontender Vascular: No leg edema    Portacath/PICC-without erythema  Lab Results:  Lab Results  Component Value Date   WBC 7.0 12/16/2017   HGB 13.0 12/16/2017   HCT 40.7 12/16/2017   MCV 92.3 12/16/2017   PLT 179 12/16/2017   NEUTROABS 4.2 12/16/2017    CMP  Lab Results  Component Value Date   NA 139 10/21/2017   K 4.2 10/21/2017   CL 102 10/21/2017   CO2 30 10/21/2017   GLUCOSE 122 (H) 10/21/2017   BUN 12 10/21/2017   CREATININE 0.89 10/21/2017   CALCIUM 9.1 10/21/2017   PROT 6.6 10/21/2017   ALBUMIN 3.7 10/21/2017   AST 29 10/21/2017   ALT 59 (H) 10/21/2017   ALKPHOS 62 10/21/2017   BILITOT 0.3 10/21/2017   GFRNONAA >60 10/21/2017   GFRAA >60 10/21/2017     Medications: I have reviewed the patient's current medications.   Assessment/Plan:  1. Metastatic renal cell carcinoma   L4 mass with extraosseous extension, L4 nerve compression  Biopsy of the L4 mass 11/18/2015 confirmed metastatic renal cell carcinoma, clear cell type  CTs of the chest, abdomen, and pelvis 11/18/2015-right lower lobe nodule,  expansile lytic lesion at the right 11th rib/costal vertebral junction, left renal mass, expansile lesion involving the L4 vertebra, lytic lesion at the left acetabulum, and a low-attenuation liver lesion  Initiation of SRS to L4 12/02/2015, Completed 12/12/2015  Initiation of Pazopanib11/04/2015  Pazopanibplaced on hold 02/06/2016 secondary to elevated liver enzymes  Pazopanibresumed 03/07/2016 at a dose of 400 mg daily  Pazopanibdiscontinued 03/19/2016 secondary to elevated liver enzymes  Restaging CTs 04/02/2016-stable left renal mass, decreased soft tissue component associated with the L4 metastasis, increased soft tissue component associated with the right 11th rib metastasis with increased T11 bony destruction, increased sclerosis at the left acetabulum lesion  Cycle 1 nivolumab 04/12/2016  Cycle 2 nivolumab 04/26/2016  Cycle 3 nivolumab03/16/2018  Cycle 4 nivolumab 05/24/2016  Cycle 5 nivolumab 06/08/2016  MRI lumbar spine 06/21/2016-unchanged tumor at L3, increased size of retroperitoneal lymph nodes compared to a CT from 04/02/2016  Cycle 6 nivolumab 06/22/2016  CTs chest, abdomen, and pelvis 07/04/2016-enlargement of the left renal mass, right adrenal nodule, left hilar and peritoneal lymph nodes, enlargement of left acetabular lesion. Stable lung nodules.  Cycle 7 nivolumab05/12/2016   Cycle 8 nivolumab 07/20/2016  Cycle 9 nivolumab 08/02/2016  Cycle 10 nivolumab 08/20/2016  Restaging CT 09/03/2016 evaluation with stable disease  Cycle 11 nivolumab 09/05/2016  Cycle 12 nivolumab 09/19/2016  Cycle 13 nivolumab 10/03/2016  Cycle 14 nivolumab 10/17/2016  Cycle 15 nivolumab 10/31/2016  Cycle 16  nivolumab 11/14/2016 (changed to monthly schedule)  Cycle 17nivolumab10/24/2018  CTs 01/21/2017-increased left renal mass, increased size of adrenal metastases, increased lytic bone lesions, increased left lung nodule, persistent tumor at L4 with probable  epidural component  Initiation of Cabozantinib12/04/2016  Restaging CTs 05/30/2017-decreased size of left hilar mass, left renal mass, retroperitoneal adenopathy, and adrenal metastasis. Healing bone lesions.  Cabozantinib continued  CTs 10/21/2017- interval enlargement left hilar lymph node; stable rib lesions; stable mass left renal cortex; stable mildly nodular adrenal glands; stable lytic lesions within the pelvis and spine.  Cabozantinib continued 2. Pain secondary to #1-managed by Dr. Lovenia Shuck.Improved 3. Hypertension 3. Elevated transaminases 02/06/2016-Pazopanibplaced on hold  Liver enzymes normal 03/07/2016 5. Port-A-Cath placement 05/16/2016 6. Malaise/anorexia 09/05/2016. Cortisol and testosterone levels low. Hydrocortisone and testosterone replacement initiated. 7. Conjunctival/scleral erythema 09/19/2016-resolved with steroid eyedrops 8. 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 9. Hypercalcemia status post Zometa 01/23/2017-resolved 10. Pruritic rash following IV contrast 10/21/2017   Disposition: He appears unchanged.  He will continue cabozantinib.  He will return for an office and lab visit in 6 weeks.  We will plan for a restaging CT at a 7-month interval.  [name] declines an influenza vaccine.  15 minutes were spent with the patient today.  The majority of the time was used for counseling and coordination of care.  Betsy Coder, MD  12/16/2017  12:34 PM

## 2017-12-25 MED FILL — OxyCONTIN 10 MG T12A: 10 | 30 days supply | Qty: 90 | Fill #0

## 2017-12-25 MED FILL — CABOMETYX 40 MG TABLET: 40 | 30 days supply | Qty: 30 | Fill #0

## 2017-12-25 MED FILL — TESTOSTERONE 50 MG/5 GRAM P: 50 MG/5GM | 30 days supply | Qty: 300 | Fill #2

## 2018-01-16 ENCOUNTER — Other Ambulatory Visit: Payer: Self-pay | Admitting: Oncology

## 2018-01-16 DIAGNOSIS — C649 Malignant neoplasm of unspecified kidney, except renal pelvis: Secondary | ICD-10-CM

## 2018-01-21 ENCOUNTER — Telehealth: Payer: Self-pay

## 2018-01-21 MED FILL — OxyCONTIN 10 MG T12A: 10 | 30 days supply | Qty: 90 | Fill #0

## 2018-01-21 MED FILL — CABOMETYX 40 MG TABLET: 40 | 30 days supply | Qty: 30 | Fill #0

## 2018-01-21 MED FILL — TESTOSTERONE 50 MG/5 GRAM P: 50 MG/5GM | 20 days supply | Qty: 150 | Fill #0

## 2018-01-21 NOTE — Telephone Encounter (Signed)
Oral Oncology Patient Advocate Encounter  I received an email from [employer] that [employer] grant was not working, I looked into this and the grant has expired. Cabometyx copay without the grant is $8.50. There is no grant funding available right now for his disease state. I spoke to [place], the patients partner and she stated that this copay was affordable. [name] will call me back with his household size and income so that I can apply for a grant for him when one comes available.   [name] verbalized understanding and appreciation  Margy Clarks CPHT [facility] Patient [place] Phone [phone] Fax [phone]

## 2018-01-27 ENCOUNTER — Inpatient Hospital Stay: Payer: PPO | Attending: Oncology

## 2018-01-27 ENCOUNTER — Inpatient Hospital Stay: Payer: PPO

## 2018-01-27 ENCOUNTER — Inpatient Hospital Stay (HOSPITAL_BASED_OUTPATIENT_CLINIC_OR_DEPARTMENT_OTHER): Payer: PPO | Admitting: Oncology

## 2018-01-27 ENCOUNTER — Telehealth: Payer: Self-pay | Admitting: Oncology

## 2018-01-27 VITALS — BP 131/70 | HR 80 | Temp 98.6°F | Resp 18 | Ht 70.0 in | Wt 267.0 lb

## 2018-01-27 DIAGNOSIS — R739 Hyperglycemia, unspecified: Secondary | ICD-10-CM | POA: Diagnosis not present

## 2018-01-27 DIAGNOSIS — C642 Malignant neoplasm of left kidney, except renal pelvis: Secondary | ICD-10-CM | POA: Insufficient documentation

## 2018-01-27 DIAGNOSIS — E039 Hypothyroidism, unspecified: Secondary | ICD-10-CM | POA: Insufficient documentation

## 2018-01-27 DIAGNOSIS — C649 Malignant neoplasm of unspecified kidney, except renal pelvis: Secondary | ICD-10-CM

## 2018-01-27 DIAGNOSIS — C7951 Secondary malignant neoplasm of bone: Secondary | ICD-10-CM

## 2018-01-27 DIAGNOSIS — Z9221 Personal history of antineoplastic chemotherapy: Secondary | ICD-10-CM | POA: Insufficient documentation

## 2018-01-27 DIAGNOSIS — R21 Rash and other nonspecific skin eruption: Secondary | ICD-10-CM

## 2018-01-27 DIAGNOSIS — C7970 Secondary malignant neoplasm of unspecified adrenal gland: Secondary | ICD-10-CM | POA: Insufficient documentation

## 2018-01-27 DIAGNOSIS — Z9225 Personal history of immunosupression therapy: Secondary | ICD-10-CM

## 2018-01-27 DIAGNOSIS — Z95828 Presence of other vascular implants and grafts: Secondary | ICD-10-CM

## 2018-01-27 LAB — CBC WITH DIFFERENTIAL (CANCER CENTER ONLY)
Abs Immature Granulocytes: 0.02 10*3/uL (ref 0.00–0.07)
Basophils Absolute: 0.1 10*3/uL (ref 0.0–0.1)
Basophils Relative: 1 %
Eosinophils Absolute: 0.6 10*3/uL — ABNORMAL HIGH (ref 0.0–0.5)
Eosinophils Relative: 6 %
HCT: 43.6 % (ref 39.0–52.0)
Hemoglobin: 13.8 g/dL (ref 13.0–17.0)
Immature Granulocytes: 0 %
Lymphocytes Relative: 26 %
Lymphs Abs: 2.3 10*3/uL (ref 0.7–4.0)
MCH: 29.1 pg (ref 26.0–34.0)
MCHC: 31.7 g/dL (ref 30.0–36.0)
MCV: 92 fL (ref 80.0–100.0)
Monocytes Absolute: 0.6 10*3/uL (ref 0.1–1.0)
Monocytes Relative: 6 %
Neutro Abs: 5.3 10*3/uL (ref 1.7–7.7)
Neutrophils Relative %: 61 %
Platelet Count: 205 10*3/uL (ref 150–400)
RBC: 4.74 MIL/uL (ref 4.22–5.81)
RDW: 14.6 % (ref 11.5–15.5)
WBC Count: 8.8 10*3/uL (ref 4.0–10.5)
nRBC: 0 % (ref 0.0–0.2)

## 2018-01-27 LAB — CMP (CANCER CENTER ONLY)
ALT: 49 U/L — ABNORMAL HIGH (ref 0–44)
AST: 30 U/L (ref 15–41)
Albumin: 3.6 g/dL (ref 3.5–5.0)
Alkaline Phosphatase: 62 U/L (ref 38–126)
Anion gap: 9 (ref 5–15)
BUN: 12 mg/dL (ref 8–23)
CO2: 27 mmol/L (ref 22–32)
Calcium: 8.7 mg/dL — ABNORMAL LOW (ref 8.9–10.3)
Chloride: 105 mmol/L (ref 98–111)
Creatinine: 1.15 mg/dL (ref 0.61–1.24)
GFR, Est AFR Am: >60 mL/min (ref >60)
GFR, Estimated: >60 mL/min (ref >60)
Glucose, Bld: 142 mg/dL — ABNORMAL HIGH (ref 70–99)
Potassium: 4 mmol/L (ref 3.5–5.1)
Sodium: 141 mmol/L (ref 135–145)
Total Bilirubin: 0.3 mg/dL (ref 0.3–1.2)
Total Protein: 6.7 g/dL (ref 6.5–8.1)

## 2018-01-27 MED ORDER — HEPARIN SOD (PORK) LOCK FLUSH 100 UNIT/ML IV SOLN
500.0000 [IU] | Freq: Once | INTRAVENOUS | Status: AC
  Administered 2018-01-27: 500 [IU] via INTRAVENOUS
  Filled 2018-01-27: qty 5

## 2018-01-27 MED ORDER — PREDNISONE 50 MG PO TABS: ORAL_TABLET | ORAL | 0 refills | Status: DC

## 2018-01-27 MED ORDER — HEPARIN SOD (PORK) LOCK FLUSH 100 UNIT/ML IV SOLN
250.0000 [IU] | Freq: Once | INTRAVENOUS | Status: DC
  Filled 2018-01-27: qty 5

## 2018-01-27 MED ORDER — SODIUM CHLORIDE 0.9% FLUSH
10.0000 mL | Freq: Once | INTRAVENOUS | Status: AC
  Administered 2018-01-27: 10 mL
  Filled 2018-01-27: qty 10

## 2018-01-27 MED FILL — predniSONE 50 MG TABS: 50 | 1 days supply | Qty: 3 | Fill #0

## 2018-01-27 NOTE — Telephone Encounter (Signed)
Gave relative avs report and appointments for December. Central radiology will call re scan.

## 2018-01-27 NOTE — Progress Notes (Signed)
[place] OFFICE PROGRESS NOTE   Diagnosis: Renal cell carcinoma  INTERVAL HISTORY:   [name] returns as scheduled.  He continues cabozantinib.  He has a rash at the upper anterior chest.  Stable pain in the right leg.  Good appetite.  He is now ambulating without a cane or walker.  Objective:  Vital signs in last 24 hours:  Blood pressure 131/70, pulse 80, temperature 98.6 F (37 C), temperature source Oral, resp. rate 18, height 5\' 10"  (1.778 m), weight 267 lb (121.1 kg), SpO2 97 %.    HEENT: No thrush or ulcers Resp: Lungs clear bilaterally Cardio: Regular rate and rhythm GI: No hepatomegaly, no mass, nontender Vascular: No leg edema  Skin: Acne type rash at the upper anterior chest, mild similar rash at the upper back  Portacath/PICC-without erythema  Lab Results:  Lab Results  Component Value Date   WBC 8.8 01/27/2018   HGB 13.8 01/27/2018   HCT 43.6 01/27/2018   MCV 92.0 01/27/2018   PLT 205 01/27/2018   NEUTROABS 5.3 01/27/2018    CMP  Lab Results  Component Value Date   NA 140 12/16/2017   K 4.1 12/16/2017   CL 103 12/16/2017   CO2 29 12/16/2017   GLUCOSE 107 (H) 12/16/2017   BUN 13 12/16/2017   CREATININE 0.92 12/16/2017   CALCIUM 9.0 12/16/2017   PROT 6.6 12/16/2017   ALBUMIN 3.6 12/16/2017   AST 25 12/16/2017   ALT 47 (H) 12/16/2017   ALKPHOS 59 12/16/2017   BILITOT 0.3 12/16/2017   GFRNONAA >60 12/16/2017   GFRAA >60 12/16/2017    Lab Results  Component Value Date   CEA1 1.77 01/04/2016     Medications: I have reviewed the patient's current medications.   Assessment/Plan: 1. Metastatic renal cell carcinoma   L4 mass with extraosseous extension, L4 nerve compression  Biopsy of the L4 mass 11/18/2015 confirmed metastatic renal cell carcinoma, clear cell type  CTs of the chest, abdomen, and pelvis 11/18/2015-right lower lobe nodule, expansile lytic lesion at the right 11th rib/costal vertebral junction, left  renal mass, expansile lesion involving the L4 vertebra, lytic lesion at the left acetabulum, and a low-attenuation liver lesion  Initiation of SRS to L4 12/02/2015, Completed 12/12/2015  Initiation of Pazopanib11/04/2015  Pazopanibplaced on hold 02/06/2016 secondary to elevated liver enzymes  Pazopanibresumed 03/07/2016 at a dose of 400 mg daily  Pazopanibdiscontinued 03/19/2016 secondary to elevated liver enzymes  Restaging CTs 04/02/2016-stable left renal mass, decreased soft tissue component associated with the L4 metastasis, increased soft tissue component associated with the right 11th rib metastasis with increased T11 bony destruction, increased sclerosis at the left acetabulum lesion  Cycle 1 nivolumab 04/12/2016  Cycle 2 nivolumab 04/26/2016  Cycle 3 nivolumab03/16/2018  Cycle 4 nivolumab 05/24/2016  Cycle 5 nivolumab 06/08/2016  MRI lumbar spine 06/21/2016-unchanged tumor at L3, increased size of retroperitoneal lymph nodes compared to a CT from 04/02/2016  Cycle 6 nivolumab 06/22/2016  CTs chest, abdomen, and pelvis 07/04/2016-enlargement of the left renal mass, right adrenal nodule, left hilar and peritoneal lymph nodes, enlargement of left acetabular lesion. Stable lung nodules.  Cycle 7 nivolumab05/12/2016   Cycle 8 nivolumab 07/20/2016  Cycle 9 nivolumab 08/02/2016  Cycle 10 nivolumab 08/20/2016  Restaging CT 09/03/2016 evaluation with stable disease  Cycle 11 nivolumab 09/05/2016  Cycle 12 nivolumab 09/19/2016  Cycle 13 nivolumab 10/03/2016  Cycle 14 nivolumab 10/17/2016  Cycle 15 nivolumab 10/31/2016  Cycle 16 nivolumab 11/14/2016 (changed to monthly schedule)  Cycle  17nivolumab10/24/2018  CTs 01/21/2017-increased left renal mass, increased size of adrenal metastases, increased lytic bone lesions, increased left lung nodule, persistent tumor at L4 with probable epidural component  Initiation of Cabozantinib12/04/2016  Restaging CTs  05/30/2017-decreased size of left hilar mass, left renal mass, retroperitoneal adenopathy, and adrenal metastasis. Healing bone lesions.  Cabozantinib continued  CTs 10/21/2017- interval enlargement left hilar lymph node; stable rib lesions; stable mass left renal cortex; stable mildly nodular adrenal glands; stable lytic lesions within the pelvis and spine.  Cabozantinib continued 2. Pain secondary to #1-managed by Dr. Lovenia Shuck.Improved 3. Hypertension 3. Elevated transaminases 02/06/2016-Pazopanibplaced on hold  Liver enzymes normal 03/07/2016 5. Port-A-Cath placement 05/16/2016 6. Malaise/anorexia 09/05/2016. Cortisol and testosterone levels low. Hydrocortisone and testosterone replacement initiated. 7. Conjunctival/scleral erythema 09/19/2016-resolved with steroid eyedrops 8. 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 9. Hypercalcemia status post Zometa 01/23/2017-resolved 10. Pruritic rash following IV contrast 10/21/2017  Disposition: [name].  He will continue cabozantinib.  He will be scheduled for a restaging CT on 02/21/2018 and an office visit on 02/24/2018.  He developed a rash after the CT 10/21/2017.  No other symptoms of an allergic reaction.  He will be premedicated prior to the CT 02/21/2018.  Betsy Coder, MD  01/27/2018  3:36 PM

## 2018-01-28 ENCOUNTER — Other Ambulatory Visit: Payer: Self-pay | Admitting: Oncology

## 2018-01-28 DIAGNOSIS — C649 Malignant neoplasm of unspecified kidney, except renal pelvis: Secondary | ICD-10-CM

## 2018-01-28 LAB — TSH: TSH: 0.404 u[IU]/mL (ref 0.320–4.118)

## 2018-02-11 ENCOUNTER — Other Ambulatory Visit: Payer: Self-pay | Admitting: Oncology

## 2018-02-11 DIAGNOSIS — C649 Malignant neoplasm of unspecified kidney, except renal pelvis: Secondary | ICD-10-CM

## 2018-02-12 DIAGNOSIS — C7951 Secondary malignant neoplasm of bone: Secondary | ICD-10-CM | POA: Diagnosis not present

## 2018-02-12 MED FILL — predniSONE 50 MG TABS: 50 | 1 days supply | Qty: 3 | Fill #0

## 2018-02-12 NOTE — Telephone Encounter (Signed)
Will review w/ MD.

## 2018-02-21 ENCOUNTER — Inpatient Hospital Stay: Payer: PPO

## 2018-02-21 ENCOUNTER — Ambulatory Visit (HOSPITAL_COMMUNITY)
Admission: RE | Admit: 2018-02-21 | Discharge: 2018-02-21 | Disposition: A | Discharge status: Home / Self Care | Payer: PPO | Source: Ambulatory Visit | Attending: Oncology | Admitting: Oncology

## 2018-02-21 ENCOUNTER — Other Ambulatory Visit: Payer: Self-pay | Admitting: Oncology

## 2018-02-21 DIAGNOSIS — C649 Malignant neoplasm of unspecified kidney, except renal pelvis: Secondary | ICD-10-CM

## 2018-02-21 DIAGNOSIS — C7951 Secondary malignant neoplasm of bone: Secondary | ICD-10-CM | POA: Insufficient documentation

## 2018-02-21 DIAGNOSIS — K828 Other specified diseases of gallbladder: Secondary | ICD-10-CM | POA: Diagnosis not present

## 2018-02-21 DIAGNOSIS — R591 Generalized enlarged lymph nodes: Secondary | ICD-10-CM | POA: Insufficient documentation

## 2018-02-21 DIAGNOSIS — C642 Malignant neoplasm of left kidney, except renal pelvis: Secondary | ICD-10-CM | POA: Insufficient documentation

## 2018-02-21 DIAGNOSIS — Z95828 Presence of other vascular implants and grafts: Secondary | ICD-10-CM

## 2018-02-21 LAB — CMP (CANCER CENTER ONLY)
ALT: 28 U/L (ref 0–44)
AST: 14 U/L — ABNORMAL LOW (ref 15–41)
Albumin: 3.7 g/dL (ref 3.5–5.0)
Alkaline Phosphatase: 60 U/L (ref 38–126)
Anion gap: 10 (ref 5–15)
BUN: 13 mg/dL (ref 8–23)
CO2: 25 mmol/L (ref 22–32)
Calcium: 9.1 mg/dL (ref 8.9–10.3)
Chloride: 102 mmol/L (ref 98–111)
Creatinine: 1.04 mg/dL (ref 0.61–1.24)
GFR, Est AFR Am: >60 mL/min (ref >60)
GFR, Estimated: >60 mL/min (ref >60)
Glucose, Bld: 250 mg/dL — ABNORMAL HIGH (ref 70–99)
Potassium: 4.4 mmol/L (ref 3.5–5.1)
Sodium: 137 mmol/L (ref 135–145)
Total Bilirubin: 0.3 mg/dL (ref 0.3–1.2)
Total Protein: 6.9 g/dL (ref 6.5–8.1)

## 2018-02-21 LAB — CBC WITH DIFFERENTIAL (CANCER CENTER ONLY)
Abs Immature Granulocytes: 0.03 10*3/uL (ref 0.00–0.07)
Basophils Absolute: 0 10*3/uL (ref 0.0–0.1)
Basophils Relative: 0 %
Eosinophils Absolute: 0 10*3/uL (ref 0.0–0.5)
Eosinophils Relative: 0 %
HCT: 43.7 % (ref 39.0–52.0)
Hemoglobin: 13.7 g/dL (ref 13.0–17.0)
Immature Granulocytes: 0 %
Lymphocytes Relative: 12 %
Lymphs Abs: 0.8 10*3/uL (ref 0.7–4.0)
MCH: 28.6 pg (ref 26.0–34.0)
MCHC: 31.4 g/dL (ref 30.0–36.0)
MCV: 91.2 fL (ref 80.0–100.0)
Monocytes Absolute: 0.1 10*3/uL (ref 0.1–1.0)
Monocytes Relative: 1 %
Neutro Abs: 6.1 10*3/uL (ref 1.7–7.7)
Neutrophils Relative %: 87 %
Platelet Count: 269 10*3/uL (ref 150–400)
RBC: 4.79 MIL/uL (ref 4.22–5.81)
RDW: 14.6 % (ref 11.5–15.5)
WBC Count: 7.1 10*3/uL (ref 4.0–10.5)
nRBC: 0 % (ref 0.0–0.2)

## 2018-02-21 LAB — TSH: TSH: 0.208 u[IU]/mL — ABNORMAL LOW (ref 0.320–4.118)

## 2018-02-21 MED ORDER — SODIUM CHLORIDE (PF) 0.9 % IJ SOLN
INTRAMUSCULAR | Status: AC
  Filled 2018-02-21: qty 50

## 2018-02-21 MED ORDER — IOHEXOL 300 MG/ML  SOLN
100.0000 mL | Freq: Once | INTRAMUSCULAR | Status: AC | PRN
  Administered 2018-02-21: 100 mL via INTRAVENOUS

## 2018-02-21 MED ORDER — HEPARIN SOD (PORK) LOCK FLUSH 100 UNIT/ML IV SOLN
500.0000 [IU] | Freq: Once | INTRAVENOUS | Status: AC
  Administered 2018-02-21: 500 [IU] via INTRAVENOUS

## 2018-02-21 MED ORDER — SODIUM CHLORIDE 0.9% FLUSH
10.0000 mL | Freq: Once | INTRAVENOUS | Status: AC
  Administered 2018-02-21: 10 mL
  Filled 2018-02-21: qty 10

## 2018-02-21 MED ORDER — HEPARIN SOD (PORK) LOCK FLUSH 100 UNIT/ML IV SOLN
INTRAVENOUS | Status: AC
  Filled 2018-02-21: qty 5

## 2018-02-24 ENCOUNTER — Inpatient Hospital Stay (HOSPITAL_BASED_OUTPATIENT_CLINIC_OR_DEPARTMENT_OTHER): Payer: PPO | Admitting: Nurse Practitioner

## 2018-02-24 ENCOUNTER — Encounter: Payer: Self-pay | Admitting: Nurse Practitioner

## 2018-02-24 ENCOUNTER — Telehealth: Payer: Self-pay | Admitting: Nurse Practitioner

## 2018-02-24 VITALS — BP 136/67 | HR 88 | Temp 98.1°F | Resp 18 | Ht 70.0 in | Wt 271.4 lb

## 2018-02-24 DIAGNOSIS — C649 Malignant neoplasm of unspecified kidney, except renal pelvis: Secondary | ICD-10-CM

## 2018-02-24 DIAGNOSIS — Z9221 Personal history of antineoplastic chemotherapy: Secondary | ICD-10-CM | POA: Diagnosis not present

## 2018-02-24 DIAGNOSIS — C7951 Secondary malignant neoplasm of bone: Secondary | ICD-10-CM | POA: Diagnosis not present

## 2018-02-24 DIAGNOSIS — C642 Malignant neoplasm of left kidney, except renal pelvis: Secondary | ICD-10-CM

## 2018-02-24 DIAGNOSIS — E039 Hypothyroidism, unspecified: Secondary | ICD-10-CM | POA: Diagnosis not present

## 2018-02-24 DIAGNOSIS — Z9225 Personal history of immunosupression therapy: Secondary | ICD-10-CM | POA: Diagnosis not present

## 2018-02-24 DIAGNOSIS — R739 Hyperglycemia, unspecified: Secondary | ICD-10-CM | POA: Diagnosis not present

## 2018-02-24 DIAGNOSIS — C7970 Secondary malignant neoplasm of unspecified adrenal gland: Secondary | ICD-10-CM | POA: Diagnosis not present

## 2018-02-24 MED FILL — CABOMETYX 40 MG TABLET: 40 | 30 days supply | Qty: 30 | Fill #0

## 2018-02-24 MED FILL — TESTOSTERONE 50 MG/5 GRAM P: 50 MG/5GM | 20 days supply | Qty: 150 | Fill #1

## 2018-02-24 MED FILL — OxyCONTIN 10 MG T12A: 10 | 30 days supply | Qty: 90 | Fill #0

## 2018-02-24 NOTE — Progress Notes (Addendum)
[place] OFFICE PROGRESS NOTE   Diagnosis: Renal cell carcinoma  INTERVAL HISTORY:   Phillip [name] returns as scheduled.  He continues cabozantinib.  He denies nausea/vomiting.  No mouth sores.  Some loose stools.  He has an acne-like rash at the upper chest.  He again developed a rash after the CT scan.  His wife reports the rash was "bright red" over the back, upper abdomen and face.    Objective:  Vital signs in last 24 hours:  Blood pressure 136/67, pulse 88, temperature 98.1 F (36.7 C), temperature source Oral, resp. rate 18, height 5\' 10"  (1.778 m), weight 271 lb 6.4 oz (123.1 kg), SpO2 99 %.    HEENT: No thrush or ulcers. Resp: Lungs clear bilaterally. Cardio: Regular rate and rhythm. GI: Abdomen soft and nontender.  No hepatomegaly. Vascular: No leg edema.  Skin: Acne type rash upper chest.   Lab Results:  Lab Results  Component Value Date   WBC 7.1 02/21/2018   HGB 13.7 02/21/2018   HCT 43.7 02/21/2018   MCV 91.2 02/21/2018   PLT 269 02/21/2018   NEUTROABS 6.1 02/21/2018    Imaging:  No results found.  Medications: I have reviewed the patient's current medications.  Assessment/Plan: 1. Metastatic renal cell carcinoma   L4 mass with extraosseous extension, L4 nerve compression  Biopsy of the L4 mass 11/18/2015 confirmed metastatic renal cell carcinoma, clear cell type  CTs of the chest, abdomen, and pelvis 11/18/2015-right lower lobe nodule, expansile lytic lesion at the right 11th rib/costal vertebral junction, left renal mass, expansile lesion involving the L4 vertebra, lytic lesion at the left acetabulum, and a low-attenuation liver lesion  Initiation of SRS to L4 12/02/2015, Completed 12/12/2015  Initiation of Pazopanib11/04/2015  Pazopanibplaced on hold 02/06/2016 secondary to elevated liver enzymes  Pazopanibresumed 03/07/2016 at a dose of 400 mg daily  Pazopanibdiscontinued 03/19/2016 secondary to elevated liver  enzymes  Restaging CTs 04/02/2016-stable left renal mass, decreased soft tissue component associated with the L4 metastasis, increased soft tissue component associated with the right 11th rib metastasis with increased T11 bony destruction, increased sclerosis at the left acetabulum lesion  Cycle 1 nivolumab 04/12/2016  Cycle 2 nivolumab 04/26/2016  Cycle 3 nivolumab03/16/2018  Cycle 4 nivolumab 05/24/2016  Cycle 5 nivolumab 06/08/2016  MRI lumbar spine 06/21/2016-unchanged tumor at L3, increased size of retroperitoneal lymph nodes compared to a CT from 04/02/2016  Cycle 6 nivolumab 06/22/2016  CTs chest, abdomen, and pelvis 07/04/2016-enlargement of the left renal mass, right adrenal nodule, left hilar and peritoneal lymph nodes, enlargement of left acetabular lesion. Stable lung nodules.  Cycle 7 nivolumab05/12/2016   Cycle 8 nivolumab 07/20/2016  Cycle 9 nivolumab 08/02/2016  Cycle 10 nivolumab 08/20/2016  Restaging CT 09/03/2016 evaluation with stable disease  Cycle 11 nivolumab 09/05/2016  Cycle 12 nivolumab 09/19/2016  Cycle 13 nivolumab 10/03/2016  Cycle 14 nivolumab 10/17/2016  Cycle 15 nivolumab 10/31/2016  Cycle 16 nivolumab 11/14/2016 (changed to monthly schedule)  Cycle 17nivolumab10/24/2018  CTs 01/21/2017-increased left renal mass, increased size of adrenal metastases, increased lytic bone lesions, increased left lung nodule, persistent tumor at L4 with probable epidural component  Initiation of Cabozantinib12/04/2016  Restaging CTs 05/30/2017-decreased size of left hilar mass, left renal mass, retroperitoneal adenopathy, and adrenal metastasis. Healing bone lesions.  Cabozantinib continued  CTs 10/21/2017-interval enlargement left hilar lymph node; stable rib lesions; stable mass left renal cortex; stable mildly nodular adrenal glands; stable lytic lesions within the pelvis and spine.  Cabozantinib continued  CTs 02/21/2018- enlargement  of an  AP window lymph node.  Mildly enlarged left hilar lymph node is unchanged.  Primary renal cell carcinoma involving the upper pole of the left kidney appears similar.  Stable enlarged right periaortic lymph node adjacent to the renal vessels.  Stable multifocal bony metastatic disease.  Cabozantinib for continued 2. Pain secondary to #1-managed by Dr. Lovenia Shuck.Improved 3. Hypertension 3. Elevated transaminases 02/06/2016-Pazopanibplaced on hold  Liver enzymes normal 03/07/2016 5. Port-A-Cath placement 05/16/2016 6. Malaise/anorexia 09/05/2016. Cortisol and testosterone levels low. Hydrocortisone and testosterone replacement initiated. 7. Conjunctival/scleral erythema 09/19/2016-resolved with steroid eyedrops 8. 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 9. Hypercalcemia status post Zometa 01/23/2017-resolved 10. Pruritic rash following IV contrast 10/21/2017; rash following IV contrast 02/21/2018 despite prednisone/Benadryl premedication  Disposition: Phillip [name] appears stable.  We reviewed the recent restaging CT report/images with him and his wife at today's visit.  The scans show stable disease.  Plan to continue cabozantinib.  We reviewed the labs from 02/21/2018.  The glucose was likely elevated due to the steroid premedication for the CT scan.  The TSH returned low.  We will check a TSH and T4 when he returns in 6 weeks.  He will return for lab, Port-A-Cath flush and a follow-up visit in 6 weeks.  He will contact the office in the interim with any problems.  Patient seen with Dr. [name].    [name] ANP/GNP-BC   02/24/2018  2:52 PM This was a shared visit with [name].  Phillip [name] appears unchanged.  The restaging CT reveals stable disease.  We reviewed the CT images with Phillip [name].  He will continue cabozantinib.  Phillip Manson, MD

## 2018-02-24 NOTE — Telephone Encounter (Signed)
Printed calendar and avs. °

## 2018-02-25 ENCOUNTER — Telehealth: Payer: Self-pay | Admitting: *Deleted

## 2018-02-25 NOTE — Telephone Encounter (Signed)
Faxed office visit notes from yesterday's visit to [facility] PT per patient request.  Faxed to [phone]

## 2018-03-05 DIAGNOSIS — R531 Weakness: Secondary | ICD-10-CM | POA: Diagnosis not present

## 2018-03-05 DIAGNOSIS — M6281 Muscle weakness (generalized): Secondary | ICD-10-CM | POA: Diagnosis not present

## 2018-03-06 ENCOUNTER — Telehealth: Payer: Self-pay | Admitting: Oncology

## 2018-03-06 NOTE — Telephone Encounter (Signed)
R/s appt per 1/6 sch message - unable to reach patient - left message for patient with appt date and time

## 2018-03-12 DIAGNOSIS — R531 Weakness: Secondary | ICD-10-CM | POA: Diagnosis not present

## 2018-03-12 DIAGNOSIS — M6281 Muscle weakness (generalized): Secondary | ICD-10-CM | POA: Diagnosis not present

## 2018-03-17 ENCOUNTER — Other Ambulatory Visit: Payer: Self-pay | Admitting: Oncology

## 2018-03-17 DIAGNOSIS — C649 Malignant neoplasm of unspecified kidney, except renal pelvis: Secondary | ICD-10-CM

## 2018-03-19 DIAGNOSIS — M6281 Muscle weakness (generalized): Secondary | ICD-10-CM | POA: Diagnosis not present

## 2018-03-19 DIAGNOSIS — R531 Weakness: Secondary | ICD-10-CM | POA: Diagnosis not present

## 2018-03-25 DIAGNOSIS — R531 Weakness: Secondary | ICD-10-CM | POA: Diagnosis not present

## 2018-03-25 DIAGNOSIS — M6281 Muscle weakness (generalized): Secondary | ICD-10-CM | POA: Diagnosis not present

## 2018-03-26 ENCOUNTER — Telehealth: Payer: Self-pay | Admitting: *Deleted

## 2018-03-26 ENCOUNTER — Telehealth: Payer: Self-pay

## 2018-03-26 MED FILL — TESTOSTERONE 50 MG/5 GRAM P: 50 MG/5GM | 20 days supply | Qty: 150 | Fill #2

## 2018-03-26 MED FILL — OxyCONTIN 10 MG T12A: 10 | 30 days supply | Qty: 90 | Fill #0

## 2018-03-26 NOTE — Telephone Encounter (Signed)
Oral Oncology Patient Advocate Encounter  [place] notified me that the patients copay for his cabometyx is $1256.81. There are currently no grant funds available for his disease state. I am able to assist the patient in applying for [employer] manufacturer assistance program. I called the patient and went over this information with him, he agreed to proceed with the manufacturer assistance application. He will call me back with a time he can come in and sign the application and bring me his income document. Patient is unsure of how many tablets he has on hand, his significant other handles this and he will ask her.  This encounter will be updated until final determination.  [place] Patient [place] Phone [phone] Fax [phone]

## 2018-03-26 NOTE — Telephone Encounter (Signed)
VM from [name] returning a call to Albina Billet. Notified Albina Billet in oral chemotherapy department of return call.

## 2018-03-27 NOTE — Telephone Encounter (Signed)
Oral Oncology Patient Advocate Encounter  I faxed the completed application 4/86/28.  Patient has 1 week of medicine left.  This encounter will be updated until final determination.  [place] Patient [place] Phone [phone] Fax [phone]

## 2018-03-28 NOTE — Telephone Encounter (Signed)
Oral Oncology Patient Advocate Encounter  [place] has approved the patient to receive his Cabometyx free of charge and shipped directly to his home. Approval dates are 03/28/18-02/26/19. [employer] will be calling the patient to set up his first shipment. If he has not heard from them about 7-10 days before his refill is due, he should call [phone].  I called the patient and gave him this information, he verbalized understanding and great appreciation.  [place] Patient [place] Phone [phone] Fax [phone]

## 2018-03-31 ENCOUNTER — Telehealth: Payer: Self-pay | Admitting: *Deleted

## 2018-03-31 NOTE — Telephone Encounter (Signed)
Call to report office needs to call [phone] with new script for Cabometyx 40 mg. He has been approved by grant, but new script needed. [facility] by [employer]. Called the above number and was informed by Anderson Malta, that they do have a valid script sent on 03/27/18. Has not been processed yet. They will reach out to patient when ready to be shipped. Notified [name] of above conversation.

## 2018-04-02 DIAGNOSIS — R531 Weakness: Secondary | ICD-10-CM | POA: Diagnosis not present

## 2018-04-02 DIAGNOSIS — M6281 Muscle weakness (generalized): Secondary | ICD-10-CM | POA: Diagnosis not present

## 2018-04-04 NOTE — Telephone Encounter (Signed)
Oral Oncology Pharmacist Encounter  Received notification from [place] access services that first shipment of Cabometyx 40 mg tablets was shipped to patient on 04/02/2018.  [name], PharmD, BCPS, BCOP  04/04/2018 8:06 AM Oral Oncology Clinic [phone]

## 2018-04-07 ENCOUNTER — Other Ambulatory Visit: Payer: PPO

## 2018-04-07 ENCOUNTER — Inpatient Hospital Stay: Payer: PPO | Attending: Oncology

## 2018-04-07 ENCOUNTER — Inpatient Hospital Stay (HOSPITAL_BASED_OUTPATIENT_CLINIC_OR_DEPARTMENT_OTHER): Payer: PPO | Admitting: Nurse Practitioner

## 2018-04-07 ENCOUNTER — Encounter: Payer: Self-pay | Admitting: Nurse Practitioner

## 2018-04-07 ENCOUNTER — Inpatient Hospital Stay: Payer: PPO

## 2018-04-07 ENCOUNTER — Ambulatory Visit: Payer: PPO | Admitting: Oncology

## 2018-04-07 ENCOUNTER — Telehealth: Payer: Self-pay | Admitting: Nurse Practitioner

## 2018-04-07 VITALS — BP 121/75 | HR 50 | Temp 98.5°F | Resp 19 | Ht 70.0 in | Wt 265.2 lb

## 2018-04-07 DIAGNOSIS — R5383 Other fatigue: Secondary | ICD-10-CM | POA: Diagnosis not present

## 2018-04-07 DIAGNOSIS — C649 Malignant neoplasm of unspecified kidney, except renal pelvis: Secondary | ICD-10-CM

## 2018-04-07 DIAGNOSIS — M79604 Pain in right leg: Secondary | ICD-10-CM

## 2018-04-07 DIAGNOSIS — I10 Essential (primary) hypertension: Secondary | ICD-10-CM | POA: Insufficient documentation

## 2018-04-07 DIAGNOSIS — R21 Rash and other nonspecific skin eruption: Secondary | ICD-10-CM

## 2018-04-07 DIAGNOSIS — Z9221 Personal history of antineoplastic chemotherapy: Secondary | ICD-10-CM | POA: Diagnosis not present

## 2018-04-07 DIAGNOSIS — C642 Malignant neoplasm of left kidney, except renal pelvis: Secondary | ICD-10-CM | POA: Insufficient documentation

## 2018-04-07 DIAGNOSIS — Z95828 Presence of other vascular implants and grafts: Secondary | ICD-10-CM

## 2018-04-07 DIAGNOSIS — R110 Nausea: Secondary | ICD-10-CM | POA: Diagnosis not present

## 2018-04-07 DIAGNOSIS — C7951 Secondary malignant neoplasm of bone: Secondary | ICD-10-CM

## 2018-04-07 LAB — CMP (CANCER CENTER ONLY)
ALT: 31 U/L (ref 0–44)
AST: 24 U/L (ref 15–41)
Albumin: 3.6 g/dL (ref 3.5–5.0)
Alkaline Phosphatase: 55 U/L (ref 38–126)
Anion gap: 9 (ref 5–15)
BUN: 15 mg/dL (ref 8–23)
CO2: 27 mmol/L (ref 22–32)
Calcium: 9.2 mg/dL (ref 8.9–10.3)
Chloride: 103 mmol/L (ref 98–111)
Creatinine: 0.87 mg/dL (ref 0.61–1.24)
GFR, Est AFR Am: >60 mL/min (ref >60)
GFR, Estimated: >60 mL/min (ref >60)
Glucose, Bld: 107 mg/dL — ABNORMAL HIGH (ref 70–99)
Potassium: 4.2 mmol/L (ref 3.5–5.1)
Sodium: 139 mmol/L (ref 135–145)
Total Bilirubin: 0.3 mg/dL (ref 0.3–1.2)
Total Protein: 6.5 g/dL (ref 6.5–8.1)

## 2018-04-07 LAB — CBC WITH DIFFERENTIAL (CANCER CENTER ONLY)
Abs Immature Granulocytes: 0.02 10*3/uL (ref 0.00–0.07)
Basophils Absolute: 0 10*3/uL (ref 0.0–0.1)
Basophils Relative: 0 %
Eosinophils Absolute: 0.5 10*3/uL (ref 0.0–0.5)
Eosinophils Relative: 6 %
HCT: 42.6 % (ref 39.0–52.0)
Hemoglobin: 13.6 g/dL (ref 13.0–17.0)
Immature Granulocytes: 0 %
Lymphocytes Relative: 21 %
Lymphs Abs: 1.9 10*3/uL (ref 0.7–4.0)
MCH: 29.1 pg (ref 26.0–34.0)
MCHC: 31.9 g/dL (ref 30.0–36.0)
MCV: 91.2 fL (ref 80.0–100.0)
Monocytes Absolute: 0.6 10*3/uL (ref 0.1–1.0)
Monocytes Relative: 7 %
Neutro Abs: 6.1 10*3/uL (ref 1.7–7.7)
Neutrophils Relative %: 66 %
Platelet Count: 190 10*3/uL (ref 150–400)
RBC: 4.67 MIL/uL (ref 4.22–5.81)
RDW: 15.1 % (ref 11.5–15.5)
WBC Count: 9.2 10*3/uL (ref 4.0–10.5)
nRBC: 0 % (ref 0.0–0.2)

## 2018-04-07 LAB — TSH: TSH: 0.523 u[IU]/mL (ref 0.320–4.118)

## 2018-04-07 MED ORDER — ZOLEDRONIC ACID 4 MG/100ML IV SOLN
4.0000 mg | Freq: Once | INTRAVENOUS | Status: AC
  Administered 2018-04-07: 4 mg via INTRAVENOUS
  Filled 2018-04-07: qty 100

## 2018-04-07 MED ORDER — SODIUM CHLORIDE 0.9 % IV SOLN
INTRAVENOUS | Status: DC
  Administered 2018-04-07: 15:00:00 via INTRAVENOUS
  Filled 2018-04-07: qty 250

## 2018-04-07 MED ORDER — SODIUM CHLORIDE 0.9% FLUSH
10.0000 mL | Freq: Once | INTRAVENOUS | Status: AC
  Administered 2018-04-07: 10 mL
  Filled 2018-04-07: qty 10

## 2018-04-07 MED ORDER — HEPARIN SOD (PORK) LOCK FLUSH 100 UNIT/ML IV SOLN
500.0000 [IU] | Freq: Once | INTRAVENOUS | Status: AC
  Administered 2018-04-07: 500 [IU]
  Filled 2018-04-07: qty 5

## 2018-04-07 NOTE — Patient Instructions (Signed)

## 2018-04-07 NOTE — Progress Notes (Signed)
[facility] OFFICE PROGRESS NOTE   Diagnosis: Renal cell carcinoma  INTERVAL HISTORY:   Phillip [name] returns as scheduled.  He continues cabozantinib.  His main complaint is fatigue.  He has stable pain in the right leg.  He has occasional nausea.  Overall appetite is decreased.  No significant weight loss.  He notes an alteration in taste.  Acne type skin rash over the upper chest is stable.  Objective:  Vital signs in last 24 hours:  Blood pressure 121/75, pulse (!) 50, temperature 98.5 F (36.9 C), temperature source Oral, resp. rate 19, height 5\' 10"  (1.778 m), weight 265 lb 3.2 oz (120.3 kg), SpO2 100 %.    HEENT: No thrush or ulcers. Resp: Lungs clear bilaterally. Cardio: Regular rate and rhythm. GI: No hepatomegaly. Vascular: No leg edema.  Port-A-Cath without erythema.  Lab Results:  Lab Results  Component Value Date   WBC 9.2 04/07/2018   HGB 13.6 04/07/2018   HCT 42.6 04/07/2018   MCV 91.2 04/07/2018   PLT 190 04/07/2018   NEUTROABS 6.1 04/07/2018    Imaging:  No results found.  Medications: I have reviewed the patient's current medications.  Assessment/Plan: 1. Metastatic renal cell carcinoma   L4 mass with extraosseous extension, L4 nerve compression  Biopsy of the L4 mass 11/18/2015 confirmed metastatic renal cell carcinoma, clear cell type  CTs of the chest, abdomen, and pelvis 11/18/2015-right lower lobe nodule, expansile lytic lesion at the right 11th rib/costal vertebral junction, left renal mass, expansile lesion involving the L4 vertebra, lytic lesion at the left acetabulum, and a low-attenuation liver lesion  Initiation of SRS to L4 12/02/2015, Completed 12/12/2015  Initiation of Pazopanib11/04/2015  Pazopanibplaced on hold 02/06/2016 secondary to elevated liver enzymes  Pazopanibresumed 03/07/2016 at a dose of 400 mg daily  Pazopanibdiscontinued 03/19/2016 secondary to elevated liver enzymes  Restaging CTs  04/02/2016-stable left renal mass, decreased soft tissue component associated with the L4 metastasis, increased soft tissue component associated with the right 11th rib metastasis with increased T11 bony destruction, increased sclerosis at the left acetabulum lesion  Cycle 1 nivolumab 04/12/2016  Cycle 2 nivolumab 04/26/2016  Cycle 3 nivolumab03/16/2018  Cycle 4 nivolumab 05/24/2016  Cycle 5 nivolumab 06/08/2016  MRI lumbar spine 06/21/2016-unchanged tumor at L3, increased size of retroperitoneal lymph nodes compared to a CT from 04/02/2016  Cycle 6 nivolumab 06/22/2016  CTs chest, abdomen, and pelvis 07/04/2016-enlargement of the left renal mass, right adrenal nodule, left hilar and peritoneal lymph nodes, enlargement of left acetabular lesion. Stable lung nodules.  Cycle 7 nivolumab05/12/2016   Cycle 8 nivolumab 07/20/2016  Cycle 9 nivolumab 08/02/2016  Cycle 10 nivolumab 08/20/2016  Restaging CT 09/03/2016 evaluation with stable disease  Cycle 11 nivolumab 09/05/2016  Cycle 12 nivolumab 09/19/2016  Cycle 13 nivolumab 10/03/2016  Cycle 14 nivolumab 10/17/2016  Cycle 15 nivolumab 10/31/2016  Cycle 16 nivolumab 11/14/2016 (changed to monthly schedule)  Cycle 17nivolumab10/24/2018  CTs 01/21/2017-increased left renal mass, increased size of adrenal metastases, increased lytic bone lesions, increased left lung nodule, persistent tumor at L4 with probable epidural component  Initiation of Cabozantinib12/04/2016  Restaging CTs 05/30/2017-decreased size of left hilar mass, left renal mass, retroperitoneal adenopathy, and adrenal metastasis. Healing bone lesions.  Cabozantinib continued  CTs 10/21/2017-interval enlargement left hilar lymph node; stable rib lesions; stable mass left renal cortex; stable mildly nodular adrenal glands; stable lytic lesions within the pelvis and spine.  Cabozantinib continued  CTs 02/21/2018- enlargement of an AP window lymph node.   Mildly enlarged  left hilar lymph node is unchanged.  Primary renal cell carcinoma involving the upper pole of the left kidney appears similar.  Stable enlarged right periaortic lymph node adjacent to the renal vessels.  Stable multifocal bony metastatic disease.  Cabozantinib continued 2. Pain secondary to #1-managed by Dr. Lovenia [name].Improved 3. Hypertension 3. Elevated transaminases 02/06/2016-Pazopanibplaced on hold  Liver enzymes normal 03/07/2016 5. Port-A-Cath placement 05/16/2016 6. Malaise/anorexia 09/05/2016. Cortisol and testosterone levels low. Hydrocortisone and testosterone replacement initiated. 7. Conjunctival/scleral erythema 09/19/2016-resolved with steroid eyedrops 8. 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 9. Hypercalcemia status post Zometa 01/23/2017-resolved 10. Pruritic rash following IV contrast 10/21/2017; rash following IV contrast 02/21/2018 despite prednisone/Benadryl premedication  Disposition: Phillip [name] appears unchanged.  There is no clinical evidence for disease progression.  He will continue cabozantinib.  His main complaint is fatigue.  This is likely multifactorial.  We will follow-up on the TSH/T4 from today.  He is scheduled to receive Zometa today.  He will return for lab and follow-up in 6 weeks.  He will contact the office in the interim with any problems.  Plan reviewed with Dr. Benay [name].  Phillip [name] ANP/GNP-BC   04/07/2018  2:01 PM

## 2018-04-07 NOTE — Telephone Encounter (Signed)
Scheduled appt per 02/10 los. ° °Printed calendar and avs. °

## 2018-04-08 ENCOUNTER — Telehealth: Payer: Self-pay | Admitting: *Deleted

## 2018-04-08 LAB — T4: T4, Total: 7.8 ug/dL (ref 4.5–12.0)

## 2018-04-08 NOTE — Telephone Encounter (Signed)
-----   Message from Owens Shark, NP sent at 04/08/2018  8:46 AM EST ----- Please let him know thyroid studies are normal

## 2018-04-08 NOTE — Telephone Encounter (Signed)
Per Lattie Haw, NP thyroid studies are normal- patient verbalized understanding, and appreciation for the call.

## 2018-04-16 DIAGNOSIS — M6281 Muscle weakness (generalized): Secondary | ICD-10-CM | POA: Diagnosis not present

## 2018-04-16 DIAGNOSIS — R531 Weakness: Secondary | ICD-10-CM | POA: Diagnosis not present

## 2018-04-21 ENCOUNTER — Other Ambulatory Visit: Payer: Self-pay | Admitting: Nurse Practitioner

## 2018-04-21 DIAGNOSIS — C649 Malignant neoplasm of unspecified kidney, except renal pelvis: Secondary | ICD-10-CM

## 2018-04-21 MED FILL — TESTOSTERONE 50 MG/5 GRAM P: 50 MG/5GM | 20 days supply | Qty: 150 | Fill #3

## 2018-04-21 NOTE — Telephone Encounter (Signed)
Request order clarification.  Phillip [name] reports to [place] taking one half or 15 mg at bedtime.     May he Can he have a 90-day supply?

## 2018-04-23 ENCOUNTER — Other Ambulatory Visit: Payer: Self-pay | Admitting: *Deleted

## 2018-04-23 DIAGNOSIS — C649 Malignant neoplasm of unspecified kidney, except renal pelvis: Secondary | ICD-10-CM

## 2018-04-23 DIAGNOSIS — R531 Weakness: Secondary | ICD-10-CM | POA: Diagnosis not present

## 2018-04-23 DIAGNOSIS — M6281 Muscle weakness (generalized): Secondary | ICD-10-CM | POA: Diagnosis not present

## 2018-04-23 MED ORDER — MIRTAZAPINE 30 MG PO TABS: 30.0000 mg | ORAL_TABLET | Freq: Every day | ORAL | 0 refills | Status: DC

## 2018-04-24 MED FILL — OxyCONTIN 10 MG T12A: 10 | 30 days supply | Qty: 90 | Fill #0

## 2018-04-29 DIAGNOSIS — M6281 Muscle weakness (generalized): Secondary | ICD-10-CM | POA: Diagnosis not present

## 2018-04-29 DIAGNOSIS — R531 Weakness: Secondary | ICD-10-CM | POA: Diagnosis not present

## 2018-05-07 DIAGNOSIS — R531 Weakness: Secondary | ICD-10-CM | POA: Diagnosis not present

## 2018-05-07 DIAGNOSIS — M6281 Muscle weakness (generalized): Secondary | ICD-10-CM | POA: Diagnosis not present

## 2018-05-22 ENCOUNTER — Other Ambulatory Visit: Payer: Self-pay | Admitting: Nurse Practitioner

## 2018-05-22 ENCOUNTER — Inpatient Hospital Stay (HOSPITAL_BASED_OUTPATIENT_CLINIC_OR_DEPARTMENT_OTHER): Payer: PPO | Admitting: Oncology

## 2018-05-22 ENCOUNTER — Inpatient Hospital Stay: Payer: PPO | Admitting: Nutrition

## 2018-05-22 ENCOUNTER — Inpatient Hospital Stay: Payer: PPO | Attending: Oncology

## 2018-05-22 ENCOUNTER — Inpatient Hospital Stay: Payer: PPO

## 2018-05-22 DIAGNOSIS — C7949 Secondary malignant neoplasm of other parts of nervous system: Secondary | ICD-10-CM

## 2018-05-22 DIAGNOSIS — I10 Essential (primary) hypertension: Secondary | ICD-10-CM | POA: Diagnosis not present

## 2018-05-22 DIAGNOSIS — C642 Malignant neoplasm of left kidney, except renal pelvis: Secondary | ICD-10-CM

## 2018-05-22 DIAGNOSIS — Z95828 Presence of other vascular implants and grafts: Secondary | ICD-10-CM

## 2018-05-22 DIAGNOSIS — C649 Malignant neoplasm of unspecified kidney, except renal pelvis: Secondary | ICD-10-CM

## 2018-05-22 DIAGNOSIS — Z9221 Personal history of antineoplastic chemotherapy: Secondary | ICD-10-CM

## 2018-05-22 NOTE — Progress Notes (Signed)
[place] OFFICE PROGRESS NOTE   Diagnosis: Renal cell carcinoma  This was a telephone visit with Mr. Lagrow.  I spoke to him from his home by telephone on 05/22/2018 beginning at approximately 4:30 PM.  His significant other [name] was also on the phone call.  INTERVAL HISTORY:   Mr. Closs reports feeling the best he has in a long time.  He continues cabozantinib daily.  He takes OxyContin twice daily.  His pain is stable.  He is not taking breakthrough pain medication.  No rash or diarrhea. He reports improvement in his ambulation.  He continues weekly physical therapy. He has a poor appetite.  He drinks a liquid supplement each morning.  Objective:  Physical examination-not performed today  Lab Results:  Lab Results  Component Value Date   WBC 9.2 04/07/2018   HGB 13.6 04/07/2018   HCT 42.6 04/07/2018   MCV 91.2 04/07/2018   PLT 190 04/07/2018   NEUTROABS 6.1 04/07/2018    CMP  Lab Results  Component Value Date   NA 139 04/07/2018   K 4.2 04/07/2018   CL 103 04/07/2018   CO2 27 04/07/2018   GLUCOSE 107 (H) 04/07/2018   BUN 15 04/07/2018   CREATININE 0.87 04/07/2018   CALCIUM 9.2 04/07/2018   PROT 6.5 04/07/2018   ALBUMIN 3.6 04/07/2018   AST 24 04/07/2018   ALT 31 04/07/2018   ALKPHOS 55 04/07/2018   BILITOT 0.3 04/07/2018   GFRNONAA >60 04/07/2018   GFRAA >60 04/07/2018    Lab Results  Component Value Date   CEA1 1.77 01/04/2016     Medications: I have reviewed the patient's current medications.   Assessment/Plan: 1. Metastatic renal cell carcinoma   L4 mass with extraosseous extension, L4 nerve compression  Biopsy of the L4 mass 11/18/2015 confirmed metastatic renal cell carcinoma, clear cell type  CTs of the chest, abdomen, and pelvis 11/18/2015-right lower lobe nodule, expansile lytic lesion at the right 11th rib/costal vertebral junction, left renal mass, expansile lesion involving the L4 vertebra, lytic lesion at the  left acetabulum, and a low-attenuation liver lesion  Initiation of SRS to L4 12/02/2015, Completed 12/12/2015  Initiation of Pazopanib11/04/2015  Pazopanibplaced on hold 02/06/2016 secondary to elevated liver enzymes  Pazopanibresumed 03/07/2016 at a dose of 400 mg daily  Pazopanibdiscontinued 03/19/2016 secondary to elevated liver enzymes  Restaging CTs 04/02/2016-stable left renal mass, decreased soft tissue component associated with the L4 metastasis, increased soft tissue component associated with the right 11th rib metastasis with increased T11 bony destruction, increased sclerosis at the left acetabulum lesion  Cycle 1 nivolumab 04/12/2016  Cycle 2 nivolumab 04/26/2016  Cycle 3 nivolumab03/16/2018  Cycle 4 nivolumab 05/24/2016  Cycle 5 nivolumab 06/08/2016  MRI lumbar spine 06/21/2016-unchanged tumor at L3, increased size of retroperitoneal lymph nodes compared to a CT from 04/02/2016  Cycle 6 nivolumab 06/22/2016  CTs chest, abdomen, and pelvis 07/04/2016-enlargement of the left renal mass, right adrenal nodule, left hilar and peritoneal lymph nodes, enlargement of left acetabular lesion. Stable lung nodules.  Cycle 7 nivolumab05/12/2016   Cycle 8 nivolumab 07/20/2016  Cycle 9 nivolumab 08/02/2016  Cycle 10 nivolumab 08/20/2016  Restaging CT 09/03/2016 evaluation with stable disease  Cycle 11 nivolumab 09/05/2016  Cycle 12 nivolumab 09/19/2016  Cycle 13 nivolumab 10/03/2016  Cycle 14 nivolumab 10/17/2016  Cycle 15 nivolumab 10/31/2016  Cycle 16 nivolumab 11/14/2016 (changed to monthly schedule)  Cycle 17nivolumab10/24/2018  CTs 01/21/2017-increased left renal mass, increased size of adrenal metastases, increased lytic bone lesions,  increased left lung nodule, persistent tumor at L4 with probable epidural component  Initiation of Cabozantinib12/04/2016  Restaging CTs 05/30/2017-decreased size of left hilar mass, left renal mass, retroperitoneal  adenopathy, and adrenal metastasis. Healing bone lesions.  Cabozantinib continued  CTs 10/21/2017-interval enlargement left hilar lymph node; stable rib lesions; stable mass left renal cortex; stable mildly nodular adrenal glands; stable lytic lesions within the pelvis and spine.  Cabozantinib continued  CTs 02/21/2018- enlargement of an AP window lymph node.  Mildly enlarged left hilar lymph node is unchanged.  Primary renal cell carcinoma involving the upper pole of the left kidney appears similar.  Stable enlarged right periaortic lymph node adjacent to the renal vessels.  Stable multifocal bony metastatic disease.  Cabozantinib continued 2. Pain secondary to #1-managed by Dr. Lovenia Shuck.Improved 3. Hypertension 3. Elevated transaminases 02/06/2016-Pazopanibplaced on hold  Liver enzymes normal 03/07/2016 5. Port-A-Cath placement 05/16/2016 6. Malaise/anorexia 09/05/2016. Cortisol and testosterone levels low. Hydrocortisone and testosterone replacement initiated. 7. Conjunctival/scleral erythema 09/19/2016-resolved with steroid eyedrops 8. 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 9. Hypercalcemia status post Zometa 01/23/2017-resolved 10. Pruritic rash following IV contrast 10/21/2017; rash following IV contrast 02/21/2018 despite prednisone/Benadryl premedication    Disposition: Mr. Weldon has metastatic renal cell carcinoma.  He continues treatment with cabozantinib.  He appears to be tolerating the treatment well.  His overall clinical status is stable.  The next office visit will be delayed for approximately 4 weeks secondary to the current viral pandemic.  He will be scheduled for restaging CTs after this visit.  Mr. Raczka will return for an office visit, labs, and a Port-A-Cath flush during the week of 06/16/2018.  I called [name] at approximately 4:30 PM today.  20 minutes were spent talking with Mr. Nuon  and documenting the office visit.  Betsy Coder, MD  05/22/2018  4:26 PM

## 2018-05-23 ENCOUNTER — Telehealth: Payer: Self-pay | Admitting: Oncology

## 2018-05-23 ENCOUNTER — Other Ambulatory Visit: Payer: Self-pay | Admitting: Nurse Practitioner

## 2018-05-23 DIAGNOSIS — C649 Malignant neoplasm of unspecified kidney, except renal pelvis: Secondary | ICD-10-CM

## 2018-05-23 NOTE — Telephone Encounter (Signed)
Scheduled appt per 3/26 sch message - pt aware of appt date and time  

## 2018-05-26 MED FILL — TESTOSTERONE 50 MG/5 GRAM P: 50 MG/5GM | 20 days supply | Qty: 150 | Fill #0

## 2018-05-27 DIAGNOSIS — M5416 Radiculopathy, lumbar region: Secondary | ICD-10-CM | POA: Diagnosis not present

## 2018-05-27 DIAGNOSIS — E785 Hyperlipidemia, unspecified: Secondary | ICD-10-CM | POA: Diagnosis not present

## 2018-05-27 DIAGNOSIS — C7951 Secondary malignant neoplasm of bone: Secondary | ICD-10-CM | POA: Diagnosis not present

## 2018-05-27 DIAGNOSIS — I10 Essential (primary) hypertension: Secondary | ICD-10-CM | POA: Diagnosis not present

## 2018-05-27 DIAGNOSIS — C649 Malignant neoplasm of unspecified kidney, except renal pelvis: Secondary | ICD-10-CM | POA: Diagnosis not present

## 2018-05-27 MED FILL — OxyCONTIN 10 MG T12A: 10 | 30 days supply | Qty: 90 | Fill #0

## 2018-06-18 ENCOUNTER — Inpatient Hospital Stay: Payer: PPO

## 2018-06-18 ENCOUNTER — Other Ambulatory Visit: Payer: Self-pay

## 2018-06-18 ENCOUNTER — Inpatient Hospital Stay: Payer: PPO | Attending: Oncology | Admitting: Oncology

## 2018-06-18 ENCOUNTER — Telehealth: Payer: Self-pay | Admitting: Oncology

## 2018-06-18 VITALS — BP 145/64 | HR 64 | Temp 98.4°F | Resp 18 | Ht 70.0 in | Wt 263.6 lb

## 2018-06-18 DIAGNOSIS — Z9221 Personal history of antineoplastic chemotherapy: Secondary | ICD-10-CM | POA: Diagnosis not present

## 2018-06-18 DIAGNOSIS — C649 Malignant neoplasm of unspecified kidney, except renal pelvis: Secondary | ICD-10-CM

## 2018-06-18 DIAGNOSIS — M79606 Pain in leg, unspecified: Secondary | ICD-10-CM | POA: Diagnosis not present

## 2018-06-18 DIAGNOSIS — Z79899 Other long term (current) drug therapy: Secondary | ICD-10-CM | POA: Insufficient documentation

## 2018-06-18 DIAGNOSIS — M549 Dorsalgia, unspecified: Secondary | ICD-10-CM | POA: Insufficient documentation

## 2018-06-18 DIAGNOSIS — C642 Malignant neoplasm of left kidney, except renal pelvis: Secondary | ICD-10-CM | POA: Diagnosis not present

## 2018-06-18 DIAGNOSIS — Z95828 Presence of other vascular implants and grafts: Secondary | ICD-10-CM

## 2018-06-18 DIAGNOSIS — K769 Liver disease, unspecified: Secondary | ICD-10-CM | POA: Diagnosis not present

## 2018-06-18 DIAGNOSIS — Z452 Encounter for adjustment and management of vascular access device: Secondary | ICD-10-CM | POA: Insufficient documentation

## 2018-06-18 DIAGNOSIS — C7951 Secondary malignant neoplasm of bone: Secondary | ICD-10-CM | POA: Diagnosis not present

## 2018-06-18 LAB — CBC WITH DIFFERENTIAL (CANCER CENTER ONLY)
Abs Immature Granulocytes: 0.02 10*3/uL (ref 0.00–0.07)
Basophils Absolute: 0.1 10*3/uL (ref 0.0–0.1)
Basophils Relative: 1 %
Eosinophils Absolute: 0.8 10*3/uL — ABNORMAL HIGH (ref 0.0–0.5)
Eosinophils Relative: 10 %
HCT: 41.3 % (ref 39.0–52.0)
Hemoglobin: 13 g/dL (ref 13.0–17.0)
Immature Granulocytes: 0 %
Lymphocytes Relative: 22 %
Lymphs Abs: 1.8 10*3/uL (ref 0.7–4.0)
MCH: 28.3 pg (ref 26.0–34.0)
MCHC: 31.5 g/dL (ref 30.0–36.0)
MCV: 90 fL (ref 80.0–100.0)
Monocytes Absolute: 0.7 10*3/uL (ref 0.1–1.0)
Monocytes Relative: 9 %
Neutro Abs: 4.9 10*3/uL (ref 1.7–7.7)
Neutrophils Relative %: 58 %
Platelet Count: 208 10*3/uL (ref 150–400)
RBC: 4.59 MIL/uL (ref 4.22–5.81)
RDW: 15.7 % — ABNORMAL HIGH (ref 11.5–15.5)
WBC Count: 8.3 10*3/uL (ref 4.0–10.5)
nRBC: 0 % (ref 0.0–0.2)

## 2018-06-18 LAB — CMP (CANCER CENTER ONLY)
ALT: 27 U/L (ref 0–44)
AST: 18 U/L (ref 15–41)
Albumin: 3.4 g/dL — ABNORMAL LOW (ref 3.5–5.0)
Alkaline Phosphatase: 60 U/L (ref 38–126)
Anion gap: 10 (ref 5–15)
BUN: 15 mg/dL (ref 8–23)
CO2: 26 mmol/L (ref 22–32)
Calcium: 8.4 mg/dL — ABNORMAL LOW (ref 8.9–10.3)
Chloride: 104 mmol/L (ref 98–111)
Creatinine: 0.88 mg/dL (ref 0.61–1.24)
GFR, Est AFR Am: >60 mL/min (ref >60)
GFR, Estimated: >60 mL/min (ref >60)
Glucose, Bld: 153 mg/dL — ABNORMAL HIGH (ref 70–99)
Potassium: 3.8 mmol/L (ref 3.5–5.1)
Sodium: 140 mmol/L (ref 135–145)
Total Bilirubin: 0.3 mg/dL (ref 0.3–1.2)
Total Protein: 6.3 g/dL — ABNORMAL LOW (ref 6.5–8.1)

## 2018-06-18 MED ORDER — SODIUM CHLORIDE 0.9% FLUSH
10.0000 mL | Freq: Once | INTRAVENOUS | Status: AC
  Administered 2018-06-18: 10 mL
  Filled 2018-06-18: qty 10

## 2018-06-18 MED ORDER — HEPARIN SOD (PORK) LOCK FLUSH 100 UNIT/ML IV SOLN
500.0000 [IU] | Freq: Once | INTRAVENOUS | Status: AC
  Administered 2018-06-18: 11:00:00 500 [IU]
  Filled 2018-06-18: qty 5

## 2018-06-18 MED ORDER — PREDNISONE 50 MG PO TABS: ORAL_TABLET | ORAL | 0 refills | Status: DC

## 2018-06-18 NOTE — Addendum Note (Signed)
Addended by: Tania Ade on: 06/18/2018 12:46 PM   Modules accepted: Orders

## 2018-06-18 NOTE — Progress Notes (Signed)
[name] OFF[facility] PROGRESS NOTE   Diagnosis: Renal cell carcinoma  INTERVAL HISTORY:   [name] returns as scheduled.  He feels well.  Stable back and leg pain.  No change in his medications.  No mouth sores or diarrhea.  No rash.  Good appetite.  Objective:  Vital signs in last 24 hours:  Blood pressure (!) 145/64, pulse 64, temperature 98.4 F (36.9 C), temperature source Oral, resp. rate 18, height 5\' 10"  (1.778 m), weight 263 lb 9.6 oz (119.6 kg), SpO2 98 %.   Physical examination-not performed today  Lab Results:  Lab Results  Component Value Date   WBC 8.3 06/18/2018   HGB 13.0 06/18/2018   HCT 41.3 06/18/2018   MCV 90.0 06/18/2018   PLT 208 06/18/2018   NEUTROABS 4.9 06/18/2018    CMP  Lab Results  Component Value Date   NA 139 04/07/2018   K 4.2 04/07/2018   CL 103 04/07/2018   CO2 27 04/07/2018   GLUCOSE 107 (H) 04/07/2018   BUN 15 04/07/2018   CREATININE 0.87 04/07/2018   CALCIUM 9.2 04/07/2018   PROT 6.5 04/07/2018   ALBUMIN 3.6 04/07/2018   AST 24 04/07/2018   ALT 31 04/07/2018   ALKPHOS 55 04/07/2018   BILITOT 0.3 04/07/2018   GFRNONAA >60 04/07/2018   GFRAA >60 04/07/2018    Lab Results  Component Value Date   CEA1 1.77 01/04/2016    Medications: I have reviewed the patient's current medications.   Assessment/Plan: 1. Metastatic renal cell carcinoma   L4 mass with extraosseous extension, L4 nerve compression  Biopsy of the L4 mass 11/18/2015 confirmed metastatic renal cell carcinoma, clear cell type  CTs of the chest, abdomen, and pelvis 11/18/2015-right lower lobe nodule, expansile lytic lesion at the right 11th rib/costal vertebral junction, left renal mass, expansile lesion involving the L4 vertebra, lytic lesion at the left acetabulum, and a low-attenuation liver lesion  Initiation of SRS to L4 12/02/2015, Completed 12/12/2015  Initiation of Pazopanib11/04/2015  Pazopanibplaced on hold 02/06/2016  secondary to elevated liver enzymes  Pazopanibresumed 03/07/2016 at a dose of 400 mg daily  Pazopanibdiscontinued 03/19/2016 secondary to elevated liver enzymes  Restaging CTs 04/02/2016-stable left renal mass, decreased soft tissue component associated with the L4 metastasis, increased soft tissue component associated with the right 11th rib metastasis with increased T11 bony destruction, increased sclerosis at the left acetabulum lesion  Cycle 1 nivolumab 04/12/2016  Cycle 2 nivolumab 04/26/2016  Cycle 3 nivolumab03/16/2018  Cycle 4 nivolumab 05/24/2016  Cycle 5 nivolumab 06/08/2016  MRI lumbar spine 06/21/2016-unchanged tumor at L3, increased size of retroperitoneal lymph nodes compared to a CT from 04/02/2016  Cycle 6 nivolumab 06/22/2016  CTs chest, abdomen, and pelvis 07/04/2016-enlargement of the left renal mass, right adrenal nodule, left hilar and peritoneal lymph nodes, enlargement of left acetabular lesion. Stable lung nodules.  Cycle 7 nivolumab05/12/2016   Cycle 8 nivolumab 07/20/2016  Cycle 9 nivolumab 08/02/2016  Cycle 10 nivolumab 08/20/2016  Restaging CT 09/03/2016 evaluation with stable disease  Cycle 11 nivolumab 09/05/2016  Cycle 12 nivolumab 09/19/2016  Cycle 13 nivolumab 10/03/2016  Cycle 14 nivolumab 10/17/2016  Cycle 15 nivolumab 10/31/2016  Cycle 16 nivolumab 11/14/2016 (changed to monthly schedule)  Cycle 17nivolumab10/24/2018  CTs 01/21/2017-increased left renal mass, increased size of adrenal metastases, increased lytic bone lesions, increased left lung nodule, persistent tumor at L4 with probable epidural component  Initiation of Cabozantinib12/04/2016  Restaging CTs 05/30/2017-decreased size of left hilar mass, left renal mass, retroperitoneal adenopathy,  and adrenal metastasis. Healing bone lesions.  Cabozantinib continued  CTs 10/21/2017-interval enlargement left hilar lymph node; stable rib lesions; stable mass left  renal cortex; stable mildly nodular adrenal glands; stable lytic lesions within the pelvis and spine.  Cabozantinib continued  CTs 02/21/2018- enlargement of an AP window lymph node.  Mildly enlarged left hilar lymph node is unchanged.  Primary renal cell carcinoma involving the upper pole of the left kidney appears similar.  Stable enlarged right periaortic lymph node adjacent to the renal vessels.  Stable multifocal bony metastatic disease.  Cabozantinib continued 2. Pain secondary to #1-managed by Dr. Lovenia [name].Improved 3. Hypertension 3. Elevated transaminases 02/06/2016-Pazopanibplaced on hold  Liver enzymes normal 03/07/2016 5. Port-A-Cath placement 05/16/2016 6. Malaise/anorexia 09/05/2016. Cortisol and testosterone levels low. Hydrocortisone and testosterone replacement initiated. 7. Conjunctival/scleral erythema 09/19/2016-resolved with steroid eyedrops 8. 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 9. Hypercalcemia status post Zometa 01/23/2017-resolved 10. Pruritic rash following IV contrast 10/21/2017; rash following IV contrast 02/21/2018 despite prednisone/Benadryl premedication    Disposition: Ms. Phillip [name] has been maintained on cabozantinib since December 2018.  He appears well.  There is no clinical evidence of disease progression.  He will continue cabozantinib.  We will follow-up on the chemistry panel from today. [name] will return for an office visit and restaging CT evaluation in 6 weeks.  Phillip Coder, MD  06/18/2018  12:10 PM

## 2018-06-18 NOTE — Telephone Encounter (Signed)
Scheduled appt per 4/22 los. °

## 2018-06-27 MED FILL — TESTOSTERONE 50 MG/5 GRAM P: 50 MG/5GM | 20 days supply | Qty: 150 | Fill #1

## 2018-06-27 MED FILL — OxyCONTIN 10 MG T12A: 10 | 30 days supply | Qty: 90 | Fill #0

## 2018-07-20 ENCOUNTER — Other Ambulatory Visit: Payer: Self-pay | Admitting: Oncology

## 2018-07-20 DIAGNOSIS — C649 Malignant neoplasm of unspecified kidney, except renal pelvis: Secondary | ICD-10-CM

## 2018-07-22 MED FILL — TESTOSTERONE 50 MG/5 GRAM P: 50 MG/5GM | 20 days supply | Qty: 150 | Fill #2

## 2018-07-22 MED FILL — OxyCONTIN 10 MG T12A: 10 | 30 days supply | Qty: 90 | Fill #0

## 2018-07-28 ENCOUNTER — Other Ambulatory Visit: Payer: Self-pay | Admitting: *Deleted

## 2018-07-28 DIAGNOSIS — C649 Malignant neoplasm of unspecified kidney, except renal pelvis: Secondary | ICD-10-CM

## 2018-07-28 MED ORDER — PREDNISONE 50 MG PO TABS: ORAL_TABLET | ORAL | 0 refills | Status: DC

## 2018-07-28 MED ORDER — DIPHENHYDRAMINE HCL 50 MG PO TABS: 50.0000 mg | ORAL_TABLET | ORAL | 0 refills | Status: DC

## 2018-07-28 MED FILL — predniSONE 50 MG TABS: 50 | 1 days supply | Qty: 3 | Fill #0

## 2018-07-29 ENCOUNTER — Inpatient Hospital Stay: Payer: PPO | Attending: Oncology

## 2018-07-29 ENCOUNTER — Ambulatory Visit (HOSPITAL_COMMUNITY)
Admission: RE | Admit: 2018-07-29 | Discharge: 2018-07-29 | Disposition: A | Discharge status: Home / Self Care | Payer: PPO | Source: Ambulatory Visit | Attending: Oncology | Admitting: Oncology

## 2018-07-29 ENCOUNTER — Inpatient Hospital Stay (HOSPITAL_BASED_OUTPATIENT_CLINIC_OR_DEPARTMENT_OTHER): Payer: PPO | Admitting: Nurse Practitioner

## 2018-07-29 ENCOUNTER — Encounter: Payer: Self-pay | Admitting: Nurse Practitioner

## 2018-07-29 ENCOUNTER — Inpatient Hospital Stay: Payer: PPO

## 2018-07-29 ENCOUNTER — Other Ambulatory Visit: Payer: Self-pay

## 2018-07-29 VITALS — BP 148/58 | HR 75 | Temp 98.3°F | Resp 18 | Ht 70.0 in | Wt 266.0 lb

## 2018-07-29 DIAGNOSIS — C642 Malignant neoplasm of left kidney, except renal pelvis: Secondary | ICD-10-CM | POA: Insufficient documentation

## 2018-07-29 DIAGNOSIS — C649 Malignant neoplasm of unspecified kidney, except renal pelvis: Secondary | ICD-10-CM

## 2018-07-29 DIAGNOSIS — Z452 Encounter for adjustment and management of vascular access device: Secondary | ICD-10-CM | POA: Diagnosis not present

## 2018-07-29 DIAGNOSIS — C778 Secondary and unspecified malignant neoplasm of lymph nodes of multiple regions: Secondary | ICD-10-CM | POA: Insufficient documentation

## 2018-07-29 DIAGNOSIS — C7951 Secondary malignant neoplasm of bone: Secondary | ICD-10-CM | POA: Insufficient documentation

## 2018-07-29 DIAGNOSIS — K8020 Calculus of gallbladder without cholecystitis without obstruction: Secondary | ICD-10-CM | POA: Insufficient documentation

## 2018-07-29 DIAGNOSIS — Z95828 Presence of other vascular implants and grafts: Secondary | ICD-10-CM

## 2018-07-29 DIAGNOSIS — I10 Essential (primary) hypertension: Secondary | ICD-10-CM | POA: Diagnosis not present

## 2018-07-29 DIAGNOSIS — R918 Other nonspecific abnormal finding of lung field: Secondary | ICD-10-CM | POA: Diagnosis not present

## 2018-07-29 DIAGNOSIS — Z79899 Other long term (current) drug therapy: Secondary | ICD-10-CM

## 2018-07-29 DIAGNOSIS — C781 Secondary malignant neoplasm of mediastinum: Secondary | ICD-10-CM | POA: Diagnosis not present

## 2018-07-29 LAB — CBC WITH DIFFERENTIAL (CANCER CENTER ONLY)
Abs Immature Granulocytes: 0.02 10*3/uL (ref 0.00–0.07)
Basophils Absolute: 0 10*3/uL (ref 0.0–0.1)
Basophils Relative: 0 %
Eosinophils Absolute: 0 10*3/uL (ref 0.0–0.5)
Eosinophils Relative: 0 %
HCT: 42 % (ref 39.0–52.0)
Hemoglobin: 13.1 g/dL (ref 13.0–17.0)
Immature Granulocytes: 0 %
Lymphocytes Relative: 12 %
Lymphs Abs: 0.9 10*3/uL (ref 0.7–4.0)
MCH: 28.2 pg (ref 26.0–34.0)
MCHC: 31.2 g/dL (ref 30.0–36.0)
MCV: 90.5 fL (ref 80.0–100.0)
Monocytes Absolute: 0.1 10*3/uL (ref 0.1–1.0)
Monocytes Relative: 1 %
Neutro Abs: 6.8 10*3/uL (ref 1.7–7.7)
Neutrophils Relative %: 87 %
Platelet Count: 256 10*3/uL (ref 150–400)
RBC: 4.64 MIL/uL (ref 4.22–5.81)
RDW: 15.5 % (ref 11.5–15.5)
WBC Count: 7.8 10*3/uL (ref 4.0–10.5)
nRBC: 0 % (ref 0.0–0.2)

## 2018-07-29 LAB — CMP (CANCER CENTER ONLY)
ALT: 20 U/L (ref 0–44)
AST: 14 U/L — ABNORMAL LOW (ref 15–41)
Albumin: 3.6 g/dL (ref 3.5–5.0)
Alkaline Phosphatase: 60 U/L (ref 38–126)
Anion gap: 10 (ref 5–15)
BUN: 17 mg/dL (ref 8–23)
CO2: 25 mmol/L (ref 22–32)
Calcium: 9.1 mg/dL (ref 8.9–10.3)
Chloride: 104 mmol/L (ref 98–111)
Creatinine: 0.9 mg/dL (ref 0.61–1.24)
GFR, Est AFR Am: >60 mL/min (ref >60)
GFR, Estimated: >60 mL/min (ref >60)
Glucose, Bld: 231 mg/dL — ABNORMAL HIGH (ref 70–99)
Potassium: 4.2 mmol/L (ref 3.5–5.1)
Sodium: 139 mmol/L (ref 135–145)
Total Bilirubin: 0.3 mg/dL (ref 0.3–1.2)
Total Protein: 6.9 g/dL (ref 6.5–8.1)

## 2018-07-29 LAB — TSH: TSH: 0.282 u[IU]/mL — ABNORMAL LOW (ref 0.320–4.118)

## 2018-07-29 MED ORDER — HEPARIN SOD (PORK) LOCK FLUSH 100 UNIT/ML IV SOLN
INTRAVENOUS | Status: AC
  Filled 2018-07-29: qty 5

## 2018-07-29 MED ORDER — IOHEXOL 300 MG/ML  SOLN
100.0000 mL | Freq: Once | INTRAMUSCULAR | Status: AC | PRN
  Administered 2018-07-29: 100 mL via INTRAVENOUS

## 2018-07-29 MED ORDER — HEPARIN SOD (PORK) LOCK FLUSH 100 UNIT/ML IV SOLN
500.0000 [IU] | Freq: Once | INTRAVENOUS | Status: AC
  Administered 2018-07-29: 500 [IU] via INTRAVENOUS

## 2018-07-29 MED ORDER — SODIUM CHLORIDE 0.9% FLUSH
10.0000 mL | Freq: Once | INTRAVENOUS | Status: AC
  Administered 2018-07-29: 10 mL
  Filled 2018-07-29: qty 10

## 2018-07-29 MED ORDER — SODIUM CHLORIDE (PF) 0.9 % IJ SOLN
INTRAMUSCULAR | Status: AC
  Filled 2018-07-29: qty 50

## 2018-07-29 NOTE — Patient Instructions (Signed)

## 2018-07-29 NOTE — Progress Notes (Addendum)
[place] OFFICE PROGRESS NOTE   Diagnosis: Renal cell carcinoma  INTERVAL HISTORY:   Mr. Reddy returns as scheduled.  He continues cabozantinib.  He feels well.  He has occasional mild nausea in the morning hours.  No vomiting.  No mouth sores.  He had diarrhea after the contrast today.  Otherwise no significant diarrhea.  No skin rash.  No fever, cough, shortness of breath.  Objective:  Vital signs in last 24 hours:  Blood pressure (!) 148/58, pulse 75, temperature 98.3 F (36.8 C), temperature source Oral, resp. rate 18, height 5\' 10"  (1.778 m), weight 266 lb (120.7 kg), SpO2 98 %.    Vascular: No leg edema. Skin: Palms without erythema.  No skin rash.   Lab Results:  Lab Results  Component Value Date   WBC 7.8 07/29/2018   HGB 13.1 07/29/2018   HCT 42.0 07/29/2018   MCV 90.5 07/29/2018   PLT 256 07/29/2018   NEUTROABS 6.8 07/29/2018    Imaging:  No results found.  Medications: I have reviewed the patient's current medications.  Assessment/Plan: 1. Metastatic renal cell carcinoma   L4 mass with extraosseous extension, L4 nerve compression  Biopsy of the L4 mass 11/18/2015 confirmed metastatic renal cell carcinoma, clear cell type  CTs of the chest, abdomen, and pelvis 11/18/2015-right lower lobe nodule, expansile lytic lesion at the right 11th rib/costal vertebral junction, left renal mass, expansile lesion involving the L4 vertebra, lytic lesion at the left acetabulum, and a low-attenuation liver lesion  Initiation of SRS to L4 12/02/2015, Completed 12/12/2015  Initiation of Pazopanib11/04/2015  Pazopanibplaced on hold 02/06/2016 secondary to elevated liver enzymes  Pazopanibresumed 03/07/2016 at a dose of 400 mg daily  Pazopanibdiscontinued 03/19/2016 secondary to elevated liver enzymes  Restaging CTs 04/02/2016-stable left renal mass, decreased soft tissue component associated with the L4 metastasis, increased soft tissue  component associated with the right 11th rib metastasis with increased T11 bony destruction, increased sclerosis at the left acetabulum lesion  Cycle 1 nivolumab 04/12/2016  Cycle 2 nivolumab 04/26/2016  Cycle 3 nivolumab03/16/2018  Cycle 4 nivolumab 05/24/2016  Cycle 5 nivolumab 06/08/2016  MRI lumbar spine 06/21/2016-unchanged tumor at L3, increased size of retroperitoneal lymph nodes compared to a CT from 04/02/2016  Cycle 6 nivolumab 06/22/2016  CTs chest, abdomen, and pelvis 07/04/2016-enlargement of the left renal mass, right adrenal nodule, left hilar and peritoneal lymph nodes, enlargement of left acetabular lesion. Stable lung nodules.  Cycle 7 nivolumab05/12/2016   Cycle 8 nivolumab 07/20/2016  Cycle 9 nivolumab 08/02/2016  Cycle 10 nivolumab 08/20/2016  Restaging CT 09/03/2016 evaluation with stable disease  Cycle 11 nivolumab 09/05/2016  Cycle 12 nivolumab 09/19/2016  Cycle 13 nivolumab 10/03/2016  Cycle 14 nivolumab 10/17/2016  Cycle 15 nivolumab 10/31/2016  Cycle 16 nivolumab 11/14/2016 (changed to monthly schedule)  Cycle 17nivolumab10/24/2018  CTs 01/21/2017-increased left renal mass, increased size of adrenal metastases, increased lytic bone lesions, increased left lung nodule, persistent tumor at L4 with probable epidural component  Initiation of Cabozantinib12/04/2016  Restaging CTs 05/30/2017-decreased size of left hilar mass, left renal mass, retroperitoneal adenopathy, and adrenal metastasis. Healing bone lesions.  Cabozantinib continued  CTs 10/21/2017-interval enlargement left hilar lymph node; stable rib lesions; stable mass left renal cortex; stable mildly nodular adrenal glands; stable lytic lesions within the pelvis and spine.  Cabozantinib continued  CTs 02/21/2018- enlargement of an AP window lymph node. Mildly enlarged left hilar lymph node is unchanged. Primary renal cell carcinoma involving the upper pole of the left kidney  appears similar. Stable enlarged right periaortic lymph node adjacent to the renal vessels. Stable multifocal bony metastatic disease.  Cabozantinibcontinued  CTs 07/29/2018- stable 15 mm AP window nodes; stable left hilar node; subcarinal node slightly larger; right periaortic node 16 mm, previously 12 mm; portacaval node 24 mm, previously 16 mm; 15 mm node superior to the pancreatic head has enlarged; interval increase nodularity of both adrenal glands; stable left kidney mass; multifocal bony metastatic disease not significantly changed. 2. Pain secondary to #1-managed by Dr. Lovenia Shuck.Improved 3. Hypertension 3. Elevated transaminases 02/06/2016-Pazopanibplaced on hold  Liver enzymes normal 03/07/2016 5. Port-A-Cath placement 05/16/2016 6. Malaise/anorexia 09/05/2016. Cortisol and testosterone levels low. Hydrocortisone and testosterone replacement initiated. 7. Conjunctival/scleral erythema 09/19/2016-resolved with steroid eyedrops 8. 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 9. Hypercalcemia status post Zometa 01/23/2017-resolved 10. Pruritic rash following IV contrast 10/21/2017;rash following IV contrast 02/21/2018 despite prednisone/Benadryl premedication  Disposition: Mr. Antigua appears stable.  He has a good performance status.  We reviewed the restaging CTs from earlier today.  Overall stable disease with mild increase in a right periaortic node, portacaval node and node superior to the pancreatic head.  There is nodularity of both adrenal glands which does not appear significantly changed.  He will continue cabozantinib with the plan to repeat CT scans at a 15-month interval.  He will return for lab and follow-up in 6 weeks.  He will contact the office in the interim with any problems.  Patient seen with Dr. [name].  CT images reviewed on the computer.    [name] ANP/GNP-BC   07/29/2018  4:01 PM  This was a  shared visit with [name].  Mr. Obara appears stable.  There is no clinical evidence of disease progression.  We reviewed the CT images.  Some of the chest and abdominal lymph nodes are slightly larger.  I do not recommend changing treatment at present.  He will continue cabozantinib.  Mr. Schauf is comfortable with continuing cabozantinib.  We will consider making a treatment change if there is more significant progression on the next restaging CTs.  Julieanne Manson, MD

## 2018-07-30 ENCOUNTER — Telehealth: Payer: Self-pay | Admitting: Nurse Practitioner

## 2018-07-30 NOTE — Telephone Encounter (Signed)
scheduled appt per 6/2 los. A calendar will be mailed out.

## 2018-07-31 ENCOUNTER — Other Ambulatory Visit: Payer: Self-pay | Admitting: *Deleted

## 2018-07-31 DIAGNOSIS — C649 Malignant neoplasm of unspecified kidney, except renal pelvis: Secondary | ICD-10-CM

## 2018-07-31 MED ORDER — CABOZANTINIB S-MALATE 40 MG PO TABS: ORAL_TABLET | ORAL | 1 refills | Status: DC

## 2018-07-31 NOTE — Telephone Encounter (Signed)
Family member called to request refill on his Cabometyx. Requested to contact [facility] in [place]. Called EASE program and confirmed he receives free drug that is dispensed by the above pharmacy. E-scribed refill and notified family member.

## 2018-08-04 ENCOUNTER — Other Ambulatory Visit: Payer: Self-pay | Admitting: *Deleted

## 2018-08-04 DIAGNOSIS — C649 Malignant neoplasm of unspecified kidney, except renal pelvis: Secondary | ICD-10-CM

## 2018-08-04 MED ORDER — CABOZANTINIB S-MALATE 40 MG PO TABS: ORAL_TABLET | ORAL | 1 refills | Status: DC

## 2018-08-12 MED FILL — TESTOSTERONE 50 MG/5 GRAM P: 50 MG/5GM | 20 days supply | Qty: 150 | Fill #3

## 2018-08-25 ENCOUNTER — Telehealth: Payer: Self-pay | Admitting: *Deleted

## 2018-08-25 ENCOUNTER — Other Ambulatory Visit: Payer: Self-pay | Admitting: Oncology

## 2018-08-25 DIAGNOSIS — C649 Malignant neoplasm of unspecified kidney, except renal pelvis: Secondary | ICD-10-CM

## 2018-08-25 MED ORDER — HYDROCORTISONE 10 MG PO TABS: ORAL_TABLET | ORAL | 3 refills | Status: DC

## 2018-08-25 MED ORDER — TESTOSTERONE 20.25 MG/ACT (1.62%) TD GEL: TRANSDERMAL | 0 refills | Status: DC

## 2018-08-25 MED FILL — TESTOSTERONE 20.25 MG/ACT (: 20.25 MG/AC | 30 days supply | Qty: 75 | Fill #0

## 2018-08-25 MED FILL — OxyCONTIN 10 MG T12A: 10 | 30 days supply | Qty: 90 | Fill #0

## 2018-08-25 NOTE — Telephone Encounter (Signed)
Needs refill on hydrocortisone tablets and wants to change to [facility] for this--[facility] often does not have the medication. Also needs refill on testosterone and current script provides 20 day supply. Asking for a 30 day supply w/this refill. Per pharmacy: current dose does not allow 30 day supply. Each box gives 30 packets and they are not able to over fill or split a box. Suggest trying Androgel pump (each bottle has #60 pumps or 75 grams with 60.5 mg = 3 pumps and 81 mg = 4 pumps).  Per Dr. [name]: Change to Androgel pump with dose being #2 pumps (~ 40 mg) daily. He had intended to decrease his dose in future. Discussed w/signifiant other, [name] and she agrees to try this for now. Script called to [facility].

## 2018-09-08 MED FILL — HYDROCORTISONE 10 MG TABLET: 10 | 30 days supply | Qty: 90 | Fill #0

## 2018-09-09 DIAGNOSIS — C7951 Secondary malignant neoplasm of bone: Secondary | ICD-10-CM | POA: Diagnosis not present

## 2018-09-11 ENCOUNTER — Other Ambulatory Visit: Payer: Self-pay

## 2018-09-11 ENCOUNTER — Inpatient Hospital Stay: Payer: PPO | Attending: Oncology

## 2018-09-11 ENCOUNTER — Inpatient Hospital Stay (HOSPITAL_BASED_OUTPATIENT_CLINIC_OR_DEPARTMENT_OTHER): Payer: PPO | Admitting: Oncology

## 2018-09-11 ENCOUNTER — Inpatient Hospital Stay: Payer: PPO

## 2018-09-11 VITALS — BP 148/59 | HR 72 | Temp 98.3°F | Resp 18 | Ht 70.0 in | Wt 260.1 lb

## 2018-09-11 DIAGNOSIS — C642 Malignant neoplasm of left kidney, except renal pelvis: Secondary | ICD-10-CM | POA: Insufficient documentation

## 2018-09-11 DIAGNOSIS — C649 Malignant neoplasm of unspecified kidney, except renal pelvis: Secondary | ICD-10-CM | POA: Diagnosis not present

## 2018-09-11 DIAGNOSIS — I10 Essential (primary) hypertension: Secondary | ICD-10-CM

## 2018-09-11 DIAGNOSIS — C7951 Secondary malignant neoplasm of bone: Secondary | ICD-10-CM | POA: Diagnosis not present

## 2018-09-11 DIAGNOSIS — Z452 Encounter for adjustment and management of vascular access device: Secondary | ICD-10-CM | POA: Diagnosis not present

## 2018-09-11 DIAGNOSIS — Z95828 Presence of other vascular implants and grafts: Secondary | ICD-10-CM

## 2018-09-11 LAB — CBC WITH DIFFERENTIAL (CANCER CENTER ONLY)
Abs Immature Granulocytes: 0.02 10*3/uL (ref 0.00–0.07)
Basophils Absolute: 0.1 10*3/uL (ref 0.0–0.1)
Basophils Relative: 1 %
Eosinophils Absolute: 0.6 10*3/uL — ABNORMAL HIGH (ref 0.0–0.5)
Eosinophils Relative: 7 %
HCT: 40 % (ref 39.0–52.0)
Hemoglobin: 12.6 g/dL — ABNORMAL LOW (ref 13.0–17.0)
Immature Granulocytes: 0 %
Lymphocytes Relative: 23 %
Lymphs Abs: 2 10*3/uL (ref 0.7–4.0)
MCH: 27.6 pg (ref 26.0–34.0)
MCHC: 31.5 g/dL (ref 30.0–36.0)
MCV: 87.5 fL (ref 80.0–100.0)
Monocytes Absolute: 0.6 10*3/uL (ref 0.1–1.0)
Monocytes Relative: 7 %
Neutro Abs: 5.4 10*3/uL (ref 1.7–7.7)
Neutrophils Relative %: 62 %
Platelet Count: 237 10*3/uL (ref 150–400)
RBC: 4.57 MIL/uL (ref 4.22–5.81)
RDW: 15.7 % — ABNORMAL HIGH (ref 11.5–15.5)
WBC Count: 8.7 10*3/uL (ref 4.0–10.5)
nRBC: 0 % (ref 0.0–0.2)

## 2018-09-11 LAB — CMP (CANCER CENTER ONLY)
ALT: 22 U/L (ref 0–44)
AST: 16 U/L (ref 15–41)
Albumin: 3.4 g/dL — ABNORMAL LOW (ref 3.5–5.0)
Alkaline Phosphatase: 55 U/L (ref 38–126)
Anion gap: 9 (ref 5–15)
BUN: 18 mg/dL (ref 8–23)
CO2: 27 mmol/L (ref 22–32)
Calcium: 8.8 mg/dL — ABNORMAL LOW (ref 8.9–10.3)
Chloride: 103 mmol/L (ref 98–111)
Creatinine: 0.83 mg/dL (ref 0.61–1.24)
GFR, Est AFR Am: >60 mL/min (ref >60)
GFR, Estimated: >60 mL/min (ref >60)
Glucose, Bld: 138 mg/dL — ABNORMAL HIGH (ref 70–99)
Potassium: 4.2 mmol/L (ref 3.5–5.1)
Sodium: 139 mmol/L (ref 135–145)
Total Bilirubin: 0.2 mg/dL — ABNORMAL LOW (ref 0.3–1.2)
Total Protein: 6.6 g/dL (ref 6.5–8.1)

## 2018-09-11 MED ORDER — HEPARIN SOD (PORK) LOCK FLUSH 100 UNIT/ML IV SOLN
250.0000 [IU] | Freq: Once | INTRAVENOUS | Status: AC
  Administered 2018-09-11: 250 [IU]
  Filled 2018-09-11: qty 5

## 2018-09-11 MED ORDER — SODIUM CHLORIDE 0.9% FLUSH
10.0000 mL | Freq: Once | INTRAVENOUS | Status: AC
  Administered 2018-09-11: 10 mL
  Filled 2018-09-11: qty 10

## 2018-09-11 NOTE — Progress Notes (Signed)
[facility] OFFICE PROGRESS NOTE   Diagnosis: Renal cell carcinoma  INTERVAL HISTORY:   Ms. Westrup returns as scheduled.  He continues cabozantinib.  The right leg pain and weakness have improved.  Is now ambulating without a walker or cane.  No new complaint.  He has altered taste, but a good appetite.  Objective:  Vital signs in last 24 hours:  Blood pressure (!) 148/59, pulse 72, temperature 98.3 F (36.8 C), temperature source Oral, resp. rate 18, height 5\' 10"  (1.778 m), weight 260 lb 1 oz (118 kg), SpO2 99 %.    Limited physical examination secondary to distancing with the coded pandemic  Vascular: No leg edema  Skin: Mild acne type rash at the anterior chest  Portacath/PICC-without erythema  Lab Results:  Lab Results  Component Value Date   WBC 8.7 09/11/2018   HGB 12.6 (L) 09/11/2018   HCT 40.0 09/11/2018   MCV 87.5 09/11/2018   PLT 237 09/11/2018   NEUTROABS 5.4 09/11/2018    CMP  Lab Results  Component Value Date   NA 139 09/11/2018   K 4.2 09/11/2018   CL 103 09/11/2018   CO2 27 09/11/2018   GLUCOSE 138 (H) 09/11/2018   BUN 18 09/11/2018   CREATININE 0.83 09/11/2018   CALCIUM 8.8 (L) 09/11/2018   PROT 6.6 09/11/2018   ALBUMIN 3.4 (L) 09/11/2018   AST 16 09/11/2018   ALT 22 09/11/2018   ALKPHOS 55 09/11/2018   BILITOT 0.2 (L) 09/11/2018   GFRNONAA >60 09/11/2018   GFRAA >60 09/11/2018    Lab Results  Component Value Date   CEA1 1.77 01/04/2016     Medications: I have reviewed the patient's current medications.   Assessment/Plan: 1. Metastatic renal cell carcinoma   L4 mass with extraosseous extension, L4 nerve compression  Biopsy of the L4 mass 11/18/2015 confirmed metastatic renal cell carcinoma, clear cell type  CTs of the chest, abdomen, and pelvis 11/18/2015-right lower lobe nodule, expansile lytic lesion at the right 11th rib/costal vertebral junction, left renal mass, expansile lesion involving the L4 vertebra,  lytic lesion at the left acetabulum, and a low-attenuation liver lesion  Initiation of SRS to L4 12/02/2015, Completed 12/12/2015  Initiation of Pazopanib11/04/2015  Pazopanibplaced on hold 02/06/2016 secondary to elevated liver enzymes  Pazopanibresumed 03/07/2016 at a dose of 400 mg daily  Pazopanibdiscontinued 03/19/2016 secondary to elevated liver enzymes  Restaging CTs 04/02/2016-stable left renal mass, decreased soft tissue component associated with the L4 metastasis, increased soft tissue component associated with the right 11th rib metastasis with increased T11 bony destruction, increased sclerosis at the left acetabulum lesion  Cycle 1 nivolumab 04/12/2016  Cycle 2 nivolumab 04/26/2016  Cycle 3 nivolumab03/16/2018  Cycle 4 nivolumab 05/24/2016  Cycle 5 nivolumab 06/08/2016  MRI lumbar spine 06/21/2016-unchanged tumor at L3, increased size of retroperitoneal lymph nodes compared to a CT from 04/02/2016  Cycle 6 nivolumab 06/22/2016  CTs chest, abdomen, and pelvis 07/04/2016-enlargement of the left renal mass, right adrenal nodule, left hilar and peritoneal lymph nodes, enlargement of left acetabular lesion. Stable lung nodules.  Cycle 7 nivolumab05/12/2016   Cycle 8 nivolumab 07/20/2016  Cycle 9 nivolumab 08/02/2016  Cycle 10 nivolumab 08/20/2016  Restaging CT 09/03/2016 evaluation with stable disease  Cycle 11 nivolumab 09/05/2016  Cycle 12 nivolumab 09/19/2016  Cycle 13 nivolumab 10/03/2016  Cycle 14 nivolumab 10/17/2016  Cycle 15 nivolumab 10/31/2016  Cycle 16 nivolumab 11/14/2016 (changed to monthly schedule)  Cycle 17nivolumab10/24/2018  CTs 01/21/2017-increased left renal mass, increased size  of adrenal metastases, increased lytic bone lesions, increased left lung nodule, persistent tumor at L4 with probable epidural component  Initiation of Cabozantinib12/04/2016  Restaging CTs 05/30/2017-decreased size of left hilar mass, left renal  mass, retroperitoneal adenopathy, and adrenal metastasis. Healing bone lesions.  Cabozantinib continued  CTs 10/21/2017-interval enlargement left hilar lymph node; stable rib lesions; stable mass left renal cortex; stable mildly nodular adrenal glands; stable lytic lesions within the pelvis and spine.  Cabozantinib continued  CTs 02/21/2018- enlargement of an AP window lymph node. Mildly enlarged left hilar lymph node is unchanged. Primary renal cell carcinoma involving the upper pole of the left kidney appears similar. Stable enlarged right periaortic lymph node adjacent to the renal vessels. Stable multifocal bony metastatic disease.  Cabozantinibcontinued  CTs 07/29/2018- stable 15 mm AP window nodes; stable left hilar node; subcarinal node slightly larger; right periaortic node 16 mm, previously 12 mm; portacaval node 24 mm, previously 16 mm; 15 mm node superior to the pancreatic head has enlarged; interval increase nodularity of both adrenal glands; stable left kidney mass; multifocal bony metastatic disease not significantly changed.  Cabozantinib continued 2. Pain secondary to #1-managed by Dr. Lovenia Shuck.Improved 3. Hypertension 3. Elevated transaminases 02/06/2016-Pazopanibplaced on hold  Liver enzymes normal 03/07/2016 5. Port-A-Cath placement 05/16/2016 6. Malaise/anorexia 09/05/2016. Cortisol and testosterone levels low. Hydrocortisone and testosterone replacement initiated. 7. Conjunctival/scleral erythema 09/19/2016-resolved with steroid eyedrops 8. 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 9. Hypercalcemia status post Zometa 01/23/2017-resolved 10. Pruritic rash following IV contrast 10/21/2017;rash following IV contrast 02/21/2018 despite prednisone/Benadryl premedication    Disposition: Mr. Khader appears stable.  He continues cabozantinib.  He will return for an office visit and restaging CTs in  approximately 8 weeks.  Betsy Coder, MD  09/11/2018  3:52 PM

## 2018-09-12 ENCOUNTER — Telehealth: Payer: Self-pay | Admitting: *Deleted

## 2018-09-12 ENCOUNTER — Telehealth: Payer: Self-pay | Admitting: Oncology

## 2018-09-12 DIAGNOSIS — C649 Malignant neoplasm of unspecified kidney, except renal pelvis: Secondary | ICD-10-CM

## 2018-09-12 LAB — TSH: TSH: 0.736 u[IU]/mL (ref 0.320–4.118)

## 2018-09-12 MED ORDER — PREDNISONE 50 MG PO TABS: ORAL_TABLET | ORAL | 0 refills | Status: DC

## 2018-09-12 NOTE — Telephone Encounter (Signed)
Called and spoke with patient. Confirmed dates and times of appt  ° °

## 2018-09-12 NOTE — Telephone Encounter (Signed)
Refilled prednisone for September scan. Sent MyChart message.

## 2018-09-22 MED FILL — OXYCONTIN 10 MG T12A: 10 | 30 days supply | Qty: 90 | Fill #0

## 2018-09-23 ENCOUNTER — Other Ambulatory Visit: Payer: Self-pay | Admitting: *Deleted

## 2018-09-23 MED ORDER — TESTOSTERONE 20.25 MG/ACT (1.62%) TD GEL: TRANSDERMAL | 3 refills | Status: DC

## 2018-09-23 MED FILL — TESTOSTERONE 20.25 MG/ACT (: 20.25 MG/AC | 30 days supply | Qty: 75 | Fill #0

## 2018-09-29 ENCOUNTER — Telehealth: Payer: Self-pay | Admitting: *Deleted

## 2018-09-29 DIAGNOSIS — C649 Malignant neoplasm of unspecified kidney, except renal pelvis: Secondary | ICD-10-CM

## 2018-09-29 MED ORDER — CABOMETYX 40 MG PO TABS: ORAL_TABLET | ORAL | 1 refills | Status: DC

## 2018-09-29 NOTE — Telephone Encounter (Signed)
Call from [name] requesting refill on cabozantinib. Just received faxed request for this refill today. Refill approved.

## 2018-10-07 MED FILL — HYDROCORTISONE 10 MG TABLET: 10 | 30 days supply | Qty: 90 | Fill #1

## 2018-10-23 ENCOUNTER — Inpatient Hospital Stay: Payer: PPO | Admitting: Oncology

## 2018-10-23 ENCOUNTER — Inpatient Hospital Stay: Payer: PPO

## 2018-10-23 ENCOUNTER — Inpatient Hospital Stay: Payer: PPO | Attending: Oncology

## 2018-10-24 ENCOUNTER — Other Ambulatory Visit: Payer: Self-pay | Admitting: *Deleted

## 2018-10-24 DIAGNOSIS — C649 Malignant neoplasm of unspecified kidney, except renal pelvis: Secondary | ICD-10-CM | POA: Diagnosis not present

## 2018-10-24 DIAGNOSIS — E785 Hyperlipidemia, unspecified: Secondary | ICD-10-CM | POA: Diagnosis not present

## 2018-10-24 DIAGNOSIS — I10 Essential (primary) hypertension: Secondary | ICD-10-CM | POA: Diagnosis not present

## 2018-10-24 MED ORDER — ONDANSETRON HCL 8 MG PO TABS: ORAL_TABLET | ORAL | 2 refills | Status: DC

## 2018-10-24 MED FILL — OxyCONTIN 10 MG T12A: 10 | 30 days supply | Qty: 90 | Fill #0

## 2018-10-24 MED FILL — TESTOSTERONE 20.25 MG/ACT (: 20.25 MG/AC | 30 days supply | Qty: 75 | Fill #1

## 2018-10-24 MED FILL — ONDANSETRON HCL 8 MG TABLET: 8 | 30 days supply | Qty: 90 | Fill #0

## 2018-10-28 DIAGNOSIS — M5416 Radiculopathy, lumbar region: Secondary | ICD-10-CM | POA: Diagnosis not present

## 2018-10-28 DIAGNOSIS — Z0001 Encounter for general adult medical examination with abnormal findings: Secondary | ICD-10-CM | POA: Diagnosis not present

## 2018-10-28 DIAGNOSIS — E785 Hyperlipidemia, unspecified: Secondary | ICD-10-CM | POA: Diagnosis not present

## 2018-10-28 DIAGNOSIS — I10 Essential (primary) hypertension: Secondary | ICD-10-CM | POA: Diagnosis not present

## 2018-10-28 DIAGNOSIS — R269 Unspecified abnormalities of gait and mobility: Secondary | ICD-10-CM | POA: Diagnosis not present

## 2018-10-28 DIAGNOSIS — Z1389 Encounter for screening for other disorder: Secondary | ICD-10-CM | POA: Diagnosis not present

## 2018-10-28 DIAGNOSIS — C649 Malignant neoplasm of unspecified kidney, except renal pelvis: Secondary | ICD-10-CM | POA: Diagnosis not present

## 2018-10-30 ENCOUNTER — Other Ambulatory Visit: Payer: Self-pay | Admitting: *Deleted

## 2018-10-30 DIAGNOSIS — C649 Malignant neoplasm of unspecified kidney, except renal pelvis: Secondary | ICD-10-CM

## 2018-10-30 MED ORDER — PREDNISONE 50 MG PO TABS: ORAL_TABLET | ORAL | 0 refills | Status: DC

## 2018-10-30 MED FILL — predniSONE 50 MG TABS: 50 | 1 days supply | Qty: 3 | Fill #0

## 2018-11-05 ENCOUNTER — Telehealth: Payer: Self-pay | Admitting: Nurse Practitioner

## 2018-11-05 NOTE — Telephone Encounter (Signed)
Called patient regarding prescreening questions, patient has been asked and notified.

## 2018-11-06 ENCOUNTER — Inpatient Hospital Stay: Payer: PPO

## 2018-11-06 ENCOUNTER — Ambulatory Visit: Payer: PPO | Admitting: Nurse Practitioner

## 2018-11-06 ENCOUNTER — Inpatient Hospital Stay: Payer: PPO | Attending: Oncology

## 2018-11-06 ENCOUNTER — Ambulatory Visit (HOSPITAL_COMMUNITY)
Admission: RE | Admit: 2018-11-06 | Discharge: 2018-11-06 | Disposition: A | Discharge status: Home / Self Care | Payer: PPO | Source: Ambulatory Visit | Attending: Oncology | Admitting: Oncology

## 2018-11-06 ENCOUNTER — Other Ambulatory Visit: Payer: Self-pay

## 2018-11-06 DIAGNOSIS — C7951 Secondary malignant neoplasm of bone: Secondary | ICD-10-CM | POA: Insufficient documentation

## 2018-11-06 DIAGNOSIS — C649 Malignant neoplasm of unspecified kidney, except renal pelvis: Secondary | ICD-10-CM

## 2018-11-06 DIAGNOSIS — E785 Hyperlipidemia, unspecified: Secondary | ICD-10-CM | POA: Diagnosis not present

## 2018-11-06 DIAGNOSIS — Z79899 Other long term (current) drug therapy: Secondary | ICD-10-CM | POA: Diagnosis not present

## 2018-11-06 DIAGNOSIS — I2510 Atherosclerotic heart disease of native coronary artery without angina pectoris: Secondary | ICD-10-CM | POA: Diagnosis not present

## 2018-11-06 DIAGNOSIS — I700 Atherosclerosis of aorta: Secondary | ICD-10-CM | POA: Diagnosis not present

## 2018-11-06 DIAGNOSIS — I10 Essential (primary) hypertension: Secondary | ICD-10-CM | POA: Diagnosis not present

## 2018-11-06 DIAGNOSIS — N400 Enlarged prostate without lower urinary tract symptoms: Secondary | ICD-10-CM | POA: Diagnosis not present

## 2018-11-06 DIAGNOSIS — R590 Localized enlarged lymph nodes: Secondary | ICD-10-CM | POA: Diagnosis not present

## 2018-11-06 DIAGNOSIS — Z452 Encounter for adjustment and management of vascular access device: Secondary | ICD-10-CM | POA: Diagnosis not present

## 2018-11-06 DIAGNOSIS — Z9225 Personal history of immunosupression therapy: Secondary | ICD-10-CM | POA: Diagnosis not present

## 2018-11-06 DIAGNOSIS — R197 Diarrhea, unspecified: Secondary | ICD-10-CM | POA: Insufficient documentation

## 2018-11-06 DIAGNOSIS — R591 Generalized enlarged lymph nodes: Secondary | ICD-10-CM | POA: Insufficient documentation

## 2018-11-06 DIAGNOSIS — Z0001 Encounter for general adult medical examination with abnormal findings: Secondary | ICD-10-CM | POA: Diagnosis not present

## 2018-11-06 DIAGNOSIS — M899 Disorder of bone, unspecified: Secondary | ICD-10-CM | POA: Insufficient documentation

## 2018-11-06 DIAGNOSIS — K8020 Calculus of gallbladder without cholecystitis without obstruction: Secondary | ICD-10-CM | POA: Insufficient documentation

## 2018-11-06 DIAGNOSIS — C642 Malignant neoplasm of left kidney, except renal pelvis: Secondary | ICD-10-CM | POA: Diagnosis not present

## 2018-11-06 DIAGNOSIS — Z95828 Presence of other vascular implants and grafts: Secondary | ICD-10-CM

## 2018-11-06 DIAGNOSIS — E279 Disorder of adrenal gland, unspecified: Secondary | ICD-10-CM | POA: Diagnosis not present

## 2018-11-06 LAB — CMP (CANCER CENTER ONLY)
ALT: 25 U/L (ref 0–44)
AST: 17 U/L (ref 15–41)
Albumin: 3.8 g/dL (ref 3.5–5.0)
Alkaline Phosphatase: 74 U/L (ref 38–126)
Anion gap: 10 (ref 5–15)
BUN: 17 mg/dL (ref 8–23)
CO2: 26 mmol/L (ref 22–32)
Calcium: 8.9 mg/dL (ref 8.9–10.3)
Chloride: 103 mmol/L (ref 98–111)
Creatinine: 0.92 mg/dL (ref 0.61–1.24)
GFR, Est AFR Am: >60 mL/min (ref >60)
GFR, Estimated: >60 mL/min (ref >60)
Glucose, Bld: 174 mg/dL — ABNORMAL HIGH (ref 70–99)
Potassium: 4.6 mmol/L (ref 3.5–5.1)
Sodium: 139 mmol/L (ref 135–145)
Total Bilirubin: 0.2 mg/dL — ABNORMAL LOW (ref 0.3–1.2)
Total Protein: 7 g/dL (ref 6.5–8.1)

## 2018-11-06 LAB — CBC WITH DIFFERENTIAL (CANCER CENTER ONLY)
Abs Immature Granulocytes: 0.03 10*3/uL (ref 0.00–0.07)
Basophils Absolute: 0 10*3/uL (ref 0.0–0.1)
Basophils Relative: 0 %
Eosinophils Absolute: 0 10*3/uL (ref 0.0–0.5)
Eosinophils Relative: 0 %
HCT: 40.5 % (ref 39.0–52.0)
Hemoglobin: 12.7 g/dL — ABNORMAL LOW (ref 13.0–17.0)
Immature Granulocytes: 1 %
Lymphocytes Relative: 15 %
Lymphs Abs: 1 10*3/uL (ref 0.7–4.0)
MCH: 26.5 pg (ref 26.0–34.0)
MCHC: 31.4 g/dL (ref 30.0–36.0)
MCV: 84.4 fL (ref 80.0–100.0)
Monocytes Absolute: 0.1 10*3/uL (ref 0.1–1.0)
Monocytes Relative: 1 %
Neutro Abs: 5.4 10*3/uL (ref 1.7–7.7)
Neutrophils Relative %: 83 %
Platelet Count: 258 10*3/uL (ref 150–400)
RBC: 4.8 MIL/uL (ref 4.22–5.81)
RDW: 15.9 % — ABNORMAL HIGH (ref 11.5–15.5)
WBC Count: 6.5 10*3/uL (ref 4.0–10.5)
nRBC: 0 % (ref 0.0–0.2)

## 2018-11-06 MED ORDER — SODIUM CHLORIDE (PF) 0.9 % IJ SOLN
INTRAMUSCULAR | Status: AC
  Filled 2018-11-06: qty 50

## 2018-11-06 MED ORDER — SODIUM CHLORIDE 0.9% FLUSH
10.0000 mL | Freq: Once | INTRAVENOUS | Status: AC
  Administered 2018-11-06: 10 mL
  Filled 2018-11-06: qty 10

## 2018-11-06 MED ORDER — HEPARIN SOD (PORK) LOCK FLUSH 100 UNIT/ML IV SOLN
500.0000 [IU] | Freq: Once | INTRAVENOUS | Status: AC
  Administered 2018-11-06: 500 [IU] via INTRAVENOUS

## 2018-11-06 MED ORDER — HEPARIN SOD (PORK) LOCK FLUSH 100 UNIT/ML IV SOLN
INTRAVENOUS | Status: AC
  Filled 2018-11-06: qty 5

## 2018-11-06 MED ORDER — IOHEXOL 300 MG/ML  SOLN
100.0000 mL | Freq: Once | INTRAMUSCULAR | Status: AC | PRN
  Administered 2018-11-06: 100 mL via INTRAVENOUS

## 2018-11-07 ENCOUNTER — Encounter: Payer: Self-pay | Admitting: Nurse Practitioner

## 2018-11-07 ENCOUNTER — Other Ambulatory Visit: Payer: Self-pay

## 2018-11-07 ENCOUNTER — Inpatient Hospital Stay (HOSPITAL_BASED_OUTPATIENT_CLINIC_OR_DEPARTMENT_OTHER): Payer: PPO | Admitting: Nurse Practitioner

## 2018-11-07 VITALS — BP 144/76 | HR 72 | Temp 98.9°F | Resp 18 | Ht 70.0 in | Wt 261.1 lb

## 2018-11-07 DIAGNOSIS — C649 Malignant neoplasm of unspecified kidney, except renal pelvis: Secondary | ICD-10-CM | POA: Diagnosis not present

## 2018-11-07 DIAGNOSIS — C642 Malignant neoplasm of left kidney, except renal pelvis: Secondary | ICD-10-CM | POA: Diagnosis not present

## 2018-11-07 NOTE — Progress Notes (Addendum)
[place] OFFICE PROGRESS NOTE   Diagnosis: Renal cell carcinoma  INTERVAL HISTORY:   [name] returns as scheduled.  He continues cabozantinib.  Overall he feels well.  He denies significant pain.  He continues OxyContin 10 mg every morning and 20 mg at bedtime.  He is not requiring breakthrough pain medication.  Appetite is borderline.  No weight loss.  He has occasional nausea and occasional loose stools.  Intermittent "gas" discomfort.  No rash.  His wife has noticed a decrease in his energy level coinciding with the decrease in the testosterone dose.  Objective:  Vital signs in last 24 hours:  Blood pressure (!) 144/76, pulse 72, temperature 98.9 F (37.2 C), temperature source Oral, resp. rate 18, height 5\' 10"  (1.778 m), weight 261 lb 1.6 oz (118.4 kg), SpO2 98 %.    HEENT: No thrush or ulcers. GI: Abdomen soft and nontender.  No hepatomegaly. Vascular: No leg edema. Neuro: Alert and oriented.  Port-A-Cath without erythema.   Lab Results:  Lab Results  Component Value Date   WBC 6.5 11/06/2018   HGB 12.7 (L) 11/06/2018   HCT 40.5 11/06/2018   MCV 84.4 11/06/2018   PLT 258 11/06/2018   NEUTROABS 5.4 11/06/2018    Imaging:  Ct Chest W Contrast  Result Date: 11/06/2018 CLINICAL DATA:  Restaging of metastatic left-sided renal cell carcinoma. EXAM: CT CHEST, ABDOMEN, AND PELVIS WITH CONTRAST TECHNIQUE: Multidetector CT imaging of the chest, abdomen and pelvis was performed following the standard protocol during bolus administration of intravenous contrast. CONTRAST:  12mL OMNIPAQUE IOHEXOL 300 MG/ML  SOLN COMPARISON:  Multiple exams, including 07/29/2018 FINDINGS: CT CHEST FINDINGS Cardiovascular: Right Port-A-Cath tip: Cavoatrial junction. Coronary, aortic arch, and branch vessel atherosclerotic vascular disease. Mediastinum/Nodes: Thyroid nodules compatible with goiter. Moderately improved thoracic adenopathy. Index AP window lymph node 1.1 cm in  short axis on image 38/7, previously 1.6 cm. Left hilar node 1.2 cm in short axis on image 43/6, formerly 1.8 cm. Lungs/Pleura: Prior left suprahilar ground-glass density has resolved. No significant pulmonary nodule. Musculoskeletal: Stable metastatic lesions including the right second rib, right eleventh rib, and T1 vertebral body. The rib lesions are expansile and primarily lytic. No new bony lesions are identified. CT ABDOMEN PELVIS FINDINGS Hepatobiliary: Stable left hepatic lobe cyst on image 27/2. Multiple gallstones noted. No compelling findings of metastatic disease to the liver. Continued gallbladder wall thickening in the fundus. Pancreas: Unremarkable Spleen: Unremarkable Adrenals/Urinary Tract: Roughly stable right adrenal nodularity with mildly increased nodularity in the left adrenal gland. The dominant right adrenal mass measures 2.1 by 1.6 cm on image 39/2, formerly 2.2 by 1.7 cm. Multinodular right adrenal gland along with thickening of the adrenal gland, the dominant nodule on the left measures 2.3 by 1.7 cm, formerly 2.2 by 1.4 cm. The left kidney upper pole mass has central hypoenhancement favoring central necrosis of measures 3.8 by 3.6 cm on image 112/7, formerly the same by my measurements. Urinary bladder unremarkable. No urinary tract calculi are identified. Stomach/Bowel: Unremarkable Vascular/Lymphatic: Aortoiliac atherosclerotic vascular disease. Overall stable to very minimally improved porta hepatis and retroperitoneal adenopathy. Right porta hepatis node 2.3 cm in short axis on image 108/7, formerly 2.4 cm. Peripancreatic lymph node 1.6 cm in short axis on image 101/7, formerly the same. Retrocaval node measures 1.4 cm in short axis on image 117/7, previously 1.5 cm. Continued rind of likely small irregular lymph nodes along the margins of the abdominal aorta inferiorly, less likely to reflect aortitis. Left inguinal  node 1.2 cm in short axis on image 214/7, this level was not  included on the prior exam. Reproductive: The prostate gland measures 6.5 by 6.0 by 6.9 cm (volume = 140 cm^3), and indents the bladder base. Other: No supplemental non-categorized findings. Musculoskeletal: Overall stable bony metastatic involvement in the pelvis and lumbar spine. Stable lytic mass of the anterior wall left acetabulum. Stable lytic mass involving the right L4 vertebral body posterior elements. There is some chronic collapse of the L4 vertebral body specially in the vicinity of this lesion. Stable erosion of the adjacent inferior articular facet of L3, possibly from local tumor extension. Tumor involvement of the right L3-4 and L4-5 neural foramina not excluded. Stable compression fracture at L1 with some sclerosis along the superior endplate. Multilevel impingement in the lumbar spine. IMPRESSION: 1. Moderate improvement in the thoracic adenopathy and minimal improvement in the abdominal adenopathy compared to prior. 2. The left kidney upper pole mass and various lytic expansile bony lesions are stable from prior. 3. There has been some mild increase in the nodularity of the left adrenal gland. Right adrenal gland nodularity roughly stable. 4. Other imaging findings of potential clinical significance: Aortic Atherosclerosis (ICD10-I70.0). Coronary atherosclerosis. Cholelithiasis. Stable gallbladder wall thickening the fundus, most likely from adenomyosis. Prostatomegaly. Multilevel lumbar impingement. Electronically Signed   By: Van Clines M.D.   On: 11/06/2018 12:38   Ct Abdomen Pelvis W Contrast  Result Date: 11/06/2018 CLINICAL DATA:  Restaging of metastatic left-sided renal cell carcinoma. EXAM: CT CHEST, ABDOMEN, AND PELVIS WITH CONTRAST TECHNIQUE: Multidetector CT imaging of the chest, abdomen and pelvis was performed following the standard protocol during bolus administration of intravenous contrast. CONTRAST:  133mL OMNIPAQUE IOHEXOL 300 MG/ML  SOLN COMPARISON:  Multiple exams,  including 07/29/2018 FINDINGS: CT CHEST FINDINGS Cardiovascular: Right Port-A-Cath tip: Cavoatrial junction. Coronary, aortic arch, and branch vessel atherosclerotic vascular disease. Mediastinum/Nodes: Thyroid nodules compatible with goiter. Moderately improved thoracic adenopathy. Index AP window lymph node 1.1 cm in short axis on image 38/7, previously 1.6 cm. Left hilar node 1.2 cm in short axis on image 43/6, formerly 1.8 cm. Lungs/Pleura: Prior left suprahilar ground-glass density has resolved. No significant pulmonary nodule. Musculoskeletal: Stable metastatic lesions including the right second rib, right eleventh rib, and T1 vertebral body. The rib lesions are expansile and primarily lytic. No new bony lesions are identified. CT ABDOMEN PELVIS FINDINGS Hepatobiliary: Stable left hepatic lobe cyst on image 27/2. Multiple gallstones noted. No compelling findings of metastatic disease to the liver. Continued gallbladder wall thickening in the fundus. Pancreas: Unremarkable Spleen: Unremarkable Adrenals/Urinary Tract: Roughly stable right adrenal nodularity with mildly increased nodularity in the left adrenal gland. The dominant right adrenal mass measures 2.1 by 1.6 cm on image 39/2, formerly 2.2 by 1.7 cm. Multinodular right adrenal gland along with thickening of the adrenal gland, the dominant nodule on the left measures 2.3 by 1.7 cm, formerly 2.2 by 1.4 cm. The left kidney upper pole mass has central hypoenhancement favoring central necrosis of measures 3.8 by 3.6 cm on image 112/7, formerly the same by my measurements. Urinary bladder unremarkable. No urinary tract calculi are identified. Stomach/Bowel: Unremarkable Vascular/Lymphatic: Aortoiliac atherosclerotic vascular disease. Overall stable to very minimally improved porta hepatis and retroperitoneal adenopathy. Right porta hepatis node 2.3 cm in short axis on image 108/7, formerly 2.4 cm. Peripancreatic lymph node 1.6 cm in short axis on image 101/7,  formerly the same. Retrocaval node measures 1.4 cm in short axis on image 117/7, previously 1.5 cm. Continued  rind of likely small irregular lymph nodes along the margins of the abdominal aorta inferiorly, less likely to reflect aortitis. Left inguinal node 1.2 cm in short axis on image 214/7, this level was not included on the prior exam. Reproductive: The prostate gland measures 6.5 by 6.0 by 6.9 cm (volume = 140 cm^3), and indents the bladder base. Other: No supplemental non-categorized findings. Musculoskeletal: Overall stable bony metastatic involvement in the pelvis and lumbar spine. Stable lytic mass of the anterior wall left acetabulum. Stable lytic mass involving the right L4 vertebral body posterior elements. There is some chronic collapse of the L4 vertebral body specially in the vicinity of this lesion. Stable erosion of the adjacent inferior articular facet of L3, possibly from local tumor extension. Tumor involvement of the right L3-4 and L4-5 neural foramina not excluded. Stable compression fracture at L1 with some sclerosis along the superior endplate. Multilevel impingement in the lumbar spine. IMPRESSION: 1. Moderate improvement in the thoracic adenopathy and minimal improvement in the abdominal adenopathy compared to prior. 2. The left kidney upper pole mass and various lytic expansile bony lesions are stable from prior. 3. There has been some mild increase in the nodularity of the left adrenal gland. Right adrenal gland nodularity roughly stable. 4. Other imaging findings of potential clinical significance: Aortic Atherosclerosis (ICD10-I70.0). Coronary atherosclerosis. Cholelithiasis. Stable gallbladder wall thickening the fundus, most likely from adenomyosis. Prostatomegaly. Multilevel lumbar impingement. Electronically Signed   By: Van Clines M.D.   On: 11/06/2018 12:38    Medications: I have reviewed the patient's current medications.  Assessment/Plan: 1. Metastatic renal cell  carcinoma   L4 mass with extraosseous extension, L4 nerve compression  Biopsy of the L4 mass 11/18/2015 confirmed metastatic renal cell carcinoma, clear cell type  CTs of the chest, abdomen, and pelvis 11/18/2015-right lower lobe nodule, expansile lytic lesion at the right 11th rib/costal vertebral junction, left renal mass, expansile lesion involving the L4 vertebra, lytic lesion at the left acetabulum, and a low-attenuation liver lesion  Initiation of SRS to L4 12/02/2015, Completed 12/12/2015  Initiation of Pazopanib11/04/2015  Pazopanibplaced on hold 02/06/2016 secondary to elevated liver enzymes  Pazopanibresumed 03/07/2016 at a dose of 400 mg daily  Pazopanibdiscontinued 03/19/2016 secondary to elevated liver enzymes  Restaging CTs 04/02/2016-stable left renal mass, decreased soft tissue component associated with the L4 metastasis, increased soft tissue component associated with the right 11th rib metastasis with increased T11 bony destruction, increased sclerosis at the left acetabulum lesion  Cycle 1 nivolumab 04/12/2016  Cycle 2 nivolumab 04/26/2016  Cycle 3 nivolumab03/16/2018  Cycle 4 nivolumab 05/24/2016  Cycle 5 nivolumab 06/08/2016  MRI lumbar spine 06/21/2016-unchanged tumor at L3, increased size of retroperitoneal lymph nodes compared to a CT from 04/02/2016  Cycle 6 nivolumab 06/22/2016  CTs chest, abdomen, and pelvis 07/04/2016-enlargement of the left renal mass, right adrenal nodule, left hilar and peritoneal lymph nodes, enlargement of left acetabular lesion. Stable lung nodules.  Cycle 7 nivolumab05/12/2016   Cycle 8 nivolumab 07/20/2016  Cycle 9 nivolumab 08/02/2016  Cycle 10 nivolumab 08/20/2016  Restaging CT 09/03/2016 evaluation with stable disease  Cycle 11 nivolumab 09/05/2016  Cycle 12 nivolumab 09/19/2016  Cycle 13 nivolumab 10/03/2016  Cycle 14 nivolumab 10/17/2016  Cycle 15 nivolumab 10/31/2016  Cycle 16 nivolumab  11/14/2016 (changed to monthly schedule)  Cycle 17nivolumab10/24/2018  CTs 01/21/2017-increased left renal mass, increased size of adrenal metastases, increased lytic bone lesions, increased left lung nodule, persistent tumor at L4 with probable epidural component  Initiation of Cabozantinib12/04/2016  Restaging  CTs 05/30/2017-decreased size of left hilar mass, left renal mass, retroperitoneal adenopathy, and adrenal metastasis. Healing bone lesions.  Cabozantinib continued  CTs 10/21/2017-interval enlargement left hilar lymph node; stable rib lesions; stable mass left renal cortex; stable mildly nodular adrenal glands; stable lytic lesions within the pelvis and spine.  Cabozantinib continued  CTs 02/21/2018- enlargement of an AP window lymph node. Mildly enlarged left hilar lymph node is unchanged. Primary renal cell carcinoma involving the upper pole of the left kidney appears similar. Stable enlarged right periaortic lymph node adjacent to the renal vessels. Stable multifocal bony metastatic disease.  Cabozantinibcontinued  CTs 07/29/2018- stable 15 mm AP window nodes; stable left hilar node; subcarinal node slightly larger; right periaortic node 16 mm, previously 12 mm; portacaval node 24 mm, previously 16 mm; 15 mm node superior to the pancreatic head has enlarged; interval increase nodularity of both adrenal glands; stable left kidney mass; multifocal bony metastatic disease not significantly changed.  Cabozantinib continued  CTs 11/06/2018- moderate improvement in thoracic adenopathy; minimal improvement abdominal adenopathy; left kidney upper pole mass and various lytic expansile bone lesions stable; mild increase in nodularity of left adrenal gland; right adrenal gland nodularity stable. 2. Pain secondary to #1-managed by Dr. Lovenia Shuck.Improved 3. Hypertension 3. Elevated transaminases 02/06/2016-Pazopanibplaced on hold  Liver enzymes normal 03/07/2016 5. Port-A-Cath  placement 05/16/2016 6. Malaise/anorexia 09/05/2016. Cortisol and testosterone levels low. Hydrocortisone and testosterone replacement initiated. 7. Conjunctival/scleral erythema 09/19/2016-resolved with steroid eyedrops 8. 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 9. Hypercalcemia status post Zometa 01/23/2017-resolved 10. Pruritic rash following IV contrast 10/21/2017;rash following IV contrast 02/21/2018 despite prednisone/Benadryl premedication   Disposition: Phillip [name] appears well.  The restaging CTs from yesterday show further improvement.  Plan to continue cabozantinib.  He will increase testosterone to 3 pumps daily to see if this helps with energy.  He will return for lab, Port-A-Cath flush, follow-up in 8 weeks.  He will contact the office in the interim with any problems.  Patient seen with Dr. [name].    [name] ANP/GNP-BC   11/07/2018  2:28 PM This was a shared visit with [name].  Mr. Forgette is stable.  The restaging CTs are consistent with overall stable to improved disease.  He will continue cabozantinib.  I reviewed the CT images and discussed the CT findings with Mr. Viens.  Julieanne Manson, MD

## 2018-11-08 MED FILL — HYDROCORTISONE 10 MG TABLET: 10 | 30 days supply | Qty: 90 | Fill #2

## 2018-11-10 ENCOUNTER — Ambulatory Visit: Payer: PPO | Admitting: Nurse Practitioner

## 2018-11-10 ENCOUNTER — Telehealth: Payer: Self-pay | Admitting: Nurse Practitioner

## 2018-11-10 NOTE — Telephone Encounter (Signed)
Called and spoke with patient. Confirmed appt  °

## 2018-11-24 MED FILL — LOSARTAN-HCTZ 100-12.5 MG T: 100-12.5 | 30 days supply | Qty: 30 | Fill #0

## 2018-11-24 MED FILL — TESTOSTERONE 20.25 MG/ACT (: 20.25 MG/AC | 30 days supply | Qty: 75 | Fill #2

## 2018-11-24 MED FILL — OxyCONTIN 10 MG T12A: 10 | 30 days supply | Qty: 90 | Fill #0

## 2018-11-24 MED FILL — ONDANSETRON HCL 8 MG TABLET: 8 | 30 days supply | Qty: 90 | Fill #1

## 2018-11-25 DIAGNOSIS — I10 Essential (primary) hypertension: Secondary | ICD-10-CM | POA: Diagnosis not present

## 2018-11-25 DIAGNOSIS — E785 Hyperlipidemia, unspecified: Secondary | ICD-10-CM | POA: Diagnosis not present

## 2018-11-25 DIAGNOSIS — C649 Malignant neoplasm of unspecified kidney, except renal pelvis: Secondary | ICD-10-CM | POA: Diagnosis not present

## 2018-11-26 ENCOUNTER — Other Ambulatory Visit: Payer: Self-pay | Admitting: *Deleted

## 2018-11-26 ENCOUNTER — Telehealth: Payer: Self-pay | Admitting: *Deleted

## 2018-11-26 DIAGNOSIS — C649 Malignant neoplasm of unspecified kidney, except renal pelvis: Secondary | ICD-10-CM

## 2018-11-26 MED ORDER — MIRTAZAPINE 30 MG PO TABS: 30.0000 mg | ORAL_TABLET | Freq: Every day | ORAL | 1 refills | Status: DC

## 2018-11-26 NOTE — Telephone Encounter (Signed)
Received fax from Phillip [name] requesting refill of pt's Mirtazipine 30 mg @ [facility]  #90.  Last filled on 07/24/18. Pt wants this filled at [name], not [facility]

## 2018-11-27 ENCOUNTER — Other Ambulatory Visit: Payer: Self-pay | Admitting: *Deleted

## 2018-11-27 DIAGNOSIS — C649 Malignant neoplasm of unspecified kidney, except renal pelvis: Secondary | ICD-10-CM

## 2018-11-27 MED ORDER — CABOMETYX 40 MG PO TABS: ORAL_TABLET | ORAL | 1 refills | Status: DC

## 2018-12-05 MED FILL — HYDROCORTISONE 10 MG TABLET: 10 | 30 days supply | Qty: 90 | Fill #3

## 2018-12-12 ENCOUNTER — Telehealth: Payer: Self-pay

## 2018-12-12 NOTE — Telephone Encounter (Signed)
Oral Oncology Patient Advocate Encounter  Cabometyx is being filled with patient assistance until 02/26/19. Starting in January 2021 the patient will get his Cabometyx filled at [facility] using this grant information to cover his copay.  Was successful in securing patient a $10,000 grant from [employer] to provide copayment coverage for Cabometyx. This will keep the out of pocket expense at $0.   I called the patient and left a voicemail.  The billing information is as follows and has been shared with [facility].   Member ID: [id number] Group ID: [id number] RxBin: [id number] Dates of Eligibility: 11/12/18 through 11/11/19  [facility] Patient [place] Phone [phone] Fax [phone] 12/12/2018    2:38 PM

## 2018-12-25 ENCOUNTER — Telehealth: Payer: Self-pay | Admitting: Pharmacist

## 2018-12-25 DIAGNOSIS — C649 Malignant neoplasm of unspecified kidney, except renal pelvis: Secondary | ICD-10-CM

## 2018-12-25 MED ORDER — CABOMETYX 40 MG PO TABS: 40.0000 mg | ORAL_TABLET | Freq: Every day | ORAL | 2 refills | Status: DC

## 2018-12-25 NOTE — Telephone Encounter (Addendum)
Oral Oncology Pharmacist Encounter  Oral oncology patient advocate was successful in securing foundation copayment grant to cover out of pocket expenses for cabozantinib at the pharmacy. Patient is currently receiving his medication through [employer] assistance program and will continue to do so until the end of the 2020 calendar year. Patient will start filling cabozantinib at the [facility] in Jan 2021. New prescription sent to the pharmacy today. Patient with follow-up visit to see Dr. [name] on 01/01/19 at 3:00 pm. I will plan to see the patient in the office at that time for follow-up assessment/counseling and to provide information about continued medication acquisition.  [name], PharmD, BCPS, BCOP 10/29/20204:07 PM  [facility] [phone]

## 2018-12-26 MED FILL — LOSARTAN-HCTZ 100-12.5 MG T: 100-12.5 | 30 days supply | Qty: 30 | Fill #1

## 2018-12-26 MED FILL — ONDANSETRON HCL 8 MG TABLET: 8 | 30 days supply | Qty: 90 | Fill #2

## 2018-12-26 MED FILL — OxyCONTIN 10 MG T12A: 10 | 30 days supply | Qty: 90 | Fill #0

## 2018-12-30 DIAGNOSIS — C7951 Secondary malignant neoplasm of bone: Secondary | ICD-10-CM | POA: Diagnosis not present

## 2019-01-01 ENCOUNTER — Inpatient Hospital Stay: Payer: PPO | Attending: Oncology | Admitting: Oncology

## 2019-01-01 ENCOUNTER — Inpatient Hospital Stay: Payer: PPO

## 2019-01-01 ENCOUNTER — Telehealth: Payer: Self-pay | Admitting: Oncology

## 2019-01-01 ENCOUNTER — Other Ambulatory Visit: Payer: Self-pay

## 2019-01-01 VITALS — BP 135/71 | HR 60 | Temp 98.2°F | Resp 17 | Ht 70.0 in | Wt 250.5 lb

## 2019-01-01 DIAGNOSIS — C649 Malignant neoplasm of unspecified kidney, except renal pelvis: Secondary | ICD-10-CM | POA: Diagnosis not present

## 2019-01-01 DIAGNOSIS — R531 Weakness: Secondary | ICD-10-CM | POA: Insufficient documentation

## 2019-01-01 DIAGNOSIS — R232 Flushing: Secondary | ICD-10-CM | POA: Diagnosis not present

## 2019-01-01 DIAGNOSIS — I10 Essential (primary) hypertension: Secondary | ICD-10-CM | POA: Diagnosis not present

## 2019-01-01 DIAGNOSIS — C7951 Secondary malignant neoplasm of bone: Secondary | ICD-10-CM | POA: Insufficient documentation

## 2019-01-01 DIAGNOSIS — C642 Malignant neoplasm of left kidney, except renal pelvis: Secondary | ICD-10-CM | POA: Diagnosis not present

## 2019-01-01 DIAGNOSIS — R5381 Other malaise: Secondary | ICD-10-CM | POA: Insufficient documentation

## 2019-01-01 DIAGNOSIS — R630 Anorexia: Secondary | ICD-10-CM | POA: Insufficient documentation

## 2019-01-01 DIAGNOSIS — Z452 Encounter for adjustment and management of vascular access device: Secondary | ICD-10-CM | POA: Diagnosis not present

## 2019-01-01 DIAGNOSIS — Z9221 Personal history of antineoplastic chemotherapy: Secondary | ICD-10-CM | POA: Insufficient documentation

## 2019-01-01 DIAGNOSIS — Z79899 Other long term (current) drug therapy: Secondary | ICD-10-CM | POA: Diagnosis not present

## 2019-01-01 DIAGNOSIS — Z95828 Presence of other vascular implants and grafts: Secondary | ICD-10-CM

## 2019-01-01 LAB — CMP (CANCER CENTER ONLY)
ALT: 20 U/L (ref 0–44)
AST: 17 U/L (ref 15–41)
Albumin: 3.6 g/dL (ref 3.5–5.0)
Alkaline Phosphatase: 60 U/L (ref 38–126)
Anion gap: 9 (ref 5–15)
BUN: 19 mg/dL (ref 8–23)
CO2: 28 mmol/L (ref 22–32)
Calcium: 9.3 mg/dL (ref 8.9–10.3)
Chloride: 100 mmol/L (ref 98–111)
Creatinine: 0.86 mg/dL (ref 0.61–1.24)
GFR, Est AFR Am: >60 mL/min (ref >60)
GFR, Estimated: >60 mL/min (ref >60)
Glucose, Bld: 125 mg/dL — ABNORMAL HIGH (ref 70–99)
Potassium: 4.4 mmol/L (ref 3.5–5.1)
Sodium: 137 mmol/L (ref 135–145)
Total Bilirubin: 0.3 mg/dL (ref 0.3–1.2)
Total Protein: 6.7 g/dL (ref 6.5–8.1)

## 2019-01-01 LAB — CBC WITH DIFFERENTIAL (CANCER CENTER ONLY)
Abs Immature Granulocytes: 0.01 10*3/uL (ref 0.00–0.07)
Basophils Absolute: 0.1 10*3/uL (ref 0.0–0.1)
Basophils Relative: 1 %
Eosinophils Absolute: 0.7 10*3/uL — ABNORMAL HIGH (ref 0.0–0.5)
Eosinophils Relative: 10 %
HCT: 41.4 % (ref 39.0–52.0)
Hemoglobin: 13.1 g/dL (ref 13.0–17.0)
Immature Granulocytes: 0 %
Lymphocytes Relative: 28 %
Lymphs Abs: 1.9 10*3/uL (ref 0.7–4.0)
MCH: 27.6 pg (ref 26.0–34.0)
MCHC: 31.6 g/dL (ref 30.0–36.0)
MCV: 87.3 fL (ref 80.0–100.0)
Monocytes Absolute: 0.5 10*3/uL (ref 0.1–1.0)
Monocytes Relative: 8 %
Neutro Abs: 3.6 10*3/uL (ref 1.7–7.7)
Neutrophils Relative %: 53 %
Platelet Count: 197 10*3/uL (ref 150–400)
RBC: 4.74 MIL/uL (ref 4.22–5.81)
RDW: 17.5 % — ABNORMAL HIGH (ref 11.5–15.5)
WBC Count: 6.8 10*3/uL (ref 4.0–10.5)
nRBC: 0 % (ref 0.0–0.2)

## 2019-01-01 MED ORDER — HEPARIN SOD (PORK) LOCK FLUSH 100 UNIT/ML IV SOLN
250.0000 [IU] | Freq: Once | INTRAVENOUS | Status: AC
  Administered 2019-01-01: 500 [IU]
  Filled 2019-01-01: qty 5

## 2019-01-01 MED ORDER — SODIUM CHLORIDE 0.9% FLUSH
10.0000 mL | Freq: Once | INTRAVENOUS | Status: AC
  Administered 2019-01-01: 10 mL
  Filled 2019-01-01: qty 10

## 2019-01-01 NOTE — Patient Instructions (Signed)

## 2019-01-01 NOTE — Telephone Encounter (Signed)
Scheduled per 11/05 los, patient will receive updates on My chart. °

## 2019-01-01 NOTE — Progress Notes (Signed)
[name] OFFICE PROGRESS NOTE   Diagnosis: Renal cell carcinoma  INTERVAL HISTORY:   Phillip [name] returns for a scheduled visit.  He continues cabozantinib.  No rash or diarrhea.  His pain remains much improved.  He continues OxyContin.  Ambulation has improved.  Return for a recent fishing trip to the beach.  He is working on cars.  He reports a poor appetite.  He drinks a protein shake each morning and a milkshake in the evening.  He has hot flashes.  Objective:  Vital signs in last 24 hours:  Blood pressure 135/71, pulse 60, temperature 98.2 F (36.8 C), temperature source Temporal, resp. rate 17, height 5\' 10"  (1.778 m), weight 250 lb 8 oz (113.6 kg), SpO2 100 %.    GI: No hepatosplenomegaly, no mass, nontender Vascular: No leg edema  Skin: No rash  Portacath/PICC-without erythema  Lab Results:  Lab Results  Component Value Date   WBC 6.8 01/01/2019   HGB 13.1 01/01/2019   HCT 41.4 01/01/2019   MCV 87.3 01/01/2019   PLT 197 01/01/2019   NEUTROABS 3.6 01/01/2019    CMP  Lab Results  Component Value Date   NA 137 01/01/2019   K 4.4 01/01/2019   CL 100 01/01/2019   CO2 28 01/01/2019   GLUCOSE 125 (H) 01/01/2019   BUN 19 01/01/2019   CREATININE 0.86 01/01/2019   CALCIUM 9.3 01/01/2019   PROT 6.7 01/01/2019   ALBUMIN 3.6 01/01/2019   AST 17 01/01/2019   ALT 20 01/01/2019   ALKPHOS 60 01/01/2019   BILITOT 0.3 01/01/2019   GFRNONAA >60 01/01/2019   GFRAA >60 01/01/2019    Lab Results  Component Value Date   CEA1 1.77 01/04/2016     Medications: I have reviewed the patient's current medications.   Assessment/Plan: 1. Metastatic renal cell carcinoma   L4 mass with extraosseous extension, L4 nerve compression  Biopsy of the L4 mass 11/18/2015 confirmed metastatic renal cell carcinoma, clear cell type  CTs of the chest, abdomen, and pelvis 11/18/2015-right lower lobe nodule, expansile lytic lesion at the right 11th rib/costal  vertebral junction, left renal mass, expansile lesion involving the L4 vertebra, lytic lesion at the left acetabulum, and a low-attenuation liver lesion  Initiation of SRS to L4 12/02/2015, Completed 12/12/2015  Initiation of Pazopanib11/04/2015  Pazopanibplaced on hold 02/06/2016 secondary to elevated liver enzymes  Pazopanibresumed 03/07/2016 at a dose of 400 mg daily  Pazopanibdiscontinued 03/19/2016 secondary to elevated liver enzymes  Restaging CTs 04/02/2016-stable left renal mass, decreased soft tissue component associated with the L4 metastasis, increased soft tissue component associated with the right 11th rib metastasis with increased T11 bony destruction, increased sclerosis at the left acetabulum lesion  Cycle 1 nivolumab 04/12/2016  Cycle 2 nivolumab 04/26/2016  Cycle 3 nivolumab03/16/2018  Cycle 4 nivolumab 05/24/2016  Cycle 5 nivolumab 06/08/2016  MRI lumbar spine 06/21/2016-unchanged tumor at L3, increased size of retroperitoneal lymph nodes compared to a CT from 04/02/2016  Cycle 6 nivolumab 06/22/2016  CTs chest, abdomen, and pelvis 07/04/2016-enlargement of the left renal mass, right adrenal nodule, left hilar and peritoneal lymph nodes, enlargement of left acetabular lesion. Stable lung nodules.  Cycle 7 nivolumab05/12/2016   Cycle 8 nivolumab 07/20/2016  Cycle 9 nivolumab 08/02/2016  Cycle 10 nivolumab 08/20/2016  Restaging CT 09/03/2016 evaluation with stable disease  Cycle 11 nivolumab 09/05/2016  Cycle 12 nivolumab 09/19/2016  Cycle 13 nivolumab 10/03/2016  Cycle 14 nivolumab 10/17/2016  Cycle 15 nivolumab 10/31/2016  Cycle 16 nivolumab 11/14/2016 (  changed to monthly schedule)  Cycle 17nivolumab10/24/2018  CTs 01/21/2017-increased left renal mass, increased size of adrenal metastases, increased lytic bone lesions, increased left lung nodule, persistent tumor at L4 with probable epidural component  Initiation of  Cabozantinib12/04/2016  Restaging CTs 05/30/2017-decreased size of left hilar mass, left renal mass, retroperitoneal adenopathy, and adrenal metastasis. Healing bone lesions.  Cabozantinib continued  CTs 10/21/2017-interval enlargement left hilar lymph node; stable rib lesions; stable mass left renal cortex; stable mildly nodular adrenal glands; stable lytic lesions within the pelvis and spine.  Cabozantinib continued  CTs 02/21/2018- enlargement of an AP window lymph node. Mildly enlarged left hilar lymph node is unchanged. Primary renal cell carcinoma involving the upper pole of the left kidney appears similar. Stable enlarged right periaortic lymph node adjacent to the renal vessels. Stable multifocal bony metastatic disease.  Cabozantinibcontinued  CTs 07/29/2018- stable 15 mm AP window nodes; stable left hilar node; subcarinal node slightly larger; right periaortic node 16 mm, previously 12 mm; portacaval node 24 mm, previously 16 mm; 15 mm node superior to the pancreatic head has enlarged; interval increase nodularity of both adrenal glands; stable left kidney mass; multifocal bony metastatic disease not significantly changed.  Cabozantinib continued  CTs 11/06/2018- moderate improvement in thoracic adenopathy; minimal improvement abdominal adenopathy; left kidney upper pole mass and various lytic expansile bone lesions stable; mild increase in nodularity of left adrenal gland; right adrenal gland nodularity stable.  Cabozantinib continued 2. Pain secondary to #1-managed by Dr. Lovenia Shuck.Improved 3. Hypertension 3. Elevated transaminases 02/06/2016-Pazopanibplaced on hold  Liver enzymes normal 03/07/2016 5. Port-A-Cath placement 05/16/2016 6. Malaise/anorexia 09/05/2016. Cortisol and testosterone levels low. Hydrocortisone and testosterone replacement initiated. 7. Conjunctival/scleral erythema 09/19/2016-resolved with steroid eyedrops 8. 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 9. Hypercalcemia status post Zometa 01/23/2017-resolved 10. Pruritic rash following IV contrast 10/21/2017;rash following IV contrast 02/21/2018 despite prednisone/Benadryl premedication     Disposition: Phillip [name] appears unchanged.  His performance status is improving.  I have a low suspicion for progression of the renal cell carcinoma.  The anorexia may be related to cabozantinib.  His weight is stable from 2 years ago.  We checked a TSH today.  He will continue cabozantinib.  He will return for an office and lab visit in 6 weeks.  Betsy Coder, MD  01/01/2019  3:56 PM

## 2019-01-02 ENCOUNTER — Encounter: Payer: Self-pay | Admitting: Pharmacist

## 2019-01-02 LAB — TSH: TSH: 0.731 u[IU]/mL (ref 0.320–4.118)

## 2019-01-05 MED FILL — TESTOSTERONE 20.25 MG/ACT (: 20.25 MG/AC | 30 days supply | Qty: 75 | Fill #3

## 2019-01-05 MED FILL — HYDROCORTISONE 10 MG TABLET: 10 | 30 days supply | Qty: 90 | Fill #4

## 2019-01-23 ENCOUNTER — Other Ambulatory Visit: Payer: Self-pay | Admitting: Nurse Practitioner

## 2019-01-23 ENCOUNTER — Telehealth: Payer: Self-pay | Admitting: *Deleted

## 2019-01-23 DIAGNOSIS — C649 Malignant neoplasm of unspecified kidney, except renal pelvis: Secondary | ICD-10-CM

## 2019-01-23 MED ORDER — MIRTAZAPINE 30 MG PO TABS: 30.0000 mg | ORAL_TABLET | Freq: Every day | ORAL | 1 refills | Status: DC

## 2019-01-23 MED FILL — LOSARTAN-HCTZ 100-12.5 MG T: 100-12.5 | 30 days supply | Qty: 30 | Fill #2

## 2019-01-23 MED FILL — MIRTAZAPINE 30 MG TABLET: 30 | 90 days supply | Qty: 90 | Fill #0

## 2019-01-23 MED FILL — ONDANSETRON HCL 8 MG TABLET: 8 | 30 days supply | Qty: 90 | Fill #0

## 2019-01-23 NOTE — Telephone Encounter (Signed)
Requesting refill on mirtazapine and to transfer script to [facility] outpatient pharmacy. Called in script to automated line as requested.

## 2019-01-26 MED FILL — OxyCONTIN 10 MG T12A: 10 | 30 days supply | Qty: 90 | Fill #0

## 2019-01-30 ENCOUNTER — Other Ambulatory Visit: Payer: Self-pay | Admitting: *Deleted

## 2019-01-30 MED ORDER — CABOMETYX 40 MG PO TABS: 40.0000 mg | ORAL_TABLET | Freq: Every day | ORAL | 0 refills | Status: DC

## 2019-01-30 NOTE — Telephone Encounter (Signed)
Needs one more refill on cabometyx at [place] before future refills being done at [facility]. Refill sent.

## 2019-02-02 ENCOUNTER — Telehealth: Payer: Self-pay | Admitting: *Deleted

## 2019-02-02 NOTE — Telephone Encounter (Signed)
[name] called with concerns that Phillip [name] had a series of TIA's over the past few weeks. She reports the last one was on Friday evening. He displayed seizure like movements and his eyes deviated. He fully recovered. She felt she should have taken him to the ED but was afraid of COVID. She requested a sooner visit with Dr. [name] or an u/s of his carotid arteries. Dr. [name] agrees to see him on Friday at [place] scheduled. [name] confirms appt.

## 2019-02-04 ENCOUNTER — Other Ambulatory Visit: Payer: Self-pay | Admitting: Oncology

## 2019-02-04 MED FILL — HYDROCORTISONE 10 MG TABS: 10 | 30 days supply | Qty: 90 | Fill #5

## 2019-02-04 MED FILL — TESTOSTERONE 20.25 MG/ACT (: 20.25 MG/AC | 30 days supply | Qty: 75 | Fill #0

## 2019-02-06 ENCOUNTER — Other Ambulatory Visit: Payer: Self-pay

## 2019-02-06 ENCOUNTER — Telehealth: Payer: Self-pay | Admitting: Nurse Practitioner

## 2019-02-06 ENCOUNTER — Inpatient Hospital Stay: Payer: PPO | Attending: Oncology | Admitting: Oncology

## 2019-02-06 VITALS — BP 136/58 | HR 51 | Temp 97.8°F | Resp 16 | Ht 70.0 in | Wt 251.1 lb

## 2019-02-06 DIAGNOSIS — Z9225 Personal history of immunosupression therapy: Secondary | ICD-10-CM | POA: Diagnosis not present

## 2019-02-06 DIAGNOSIS — C7951 Secondary malignant neoplasm of bone: Secondary | ICD-10-CM | POA: Insufficient documentation

## 2019-02-06 DIAGNOSIS — G936 Cerebral edema: Secondary | ICD-10-CM | POA: Insufficient documentation

## 2019-02-06 DIAGNOSIS — C649 Malignant neoplasm of unspecified kidney, except renal pelvis: Secondary | ICD-10-CM | POA: Diagnosis not present

## 2019-02-06 DIAGNOSIS — Z79899 Other long term (current) drug therapy: Secondary | ICD-10-CM | POA: Insufficient documentation

## 2019-02-06 DIAGNOSIS — R4701 Aphasia: Secondary | ICD-10-CM | POA: Insufficient documentation

## 2019-02-06 DIAGNOSIS — I10 Essential (primary) hypertension: Secondary | ICD-10-CM | POA: Diagnosis not present

## 2019-02-06 DIAGNOSIS — C7931 Secondary malignant neoplasm of brain: Secondary | ICD-10-CM | POA: Diagnosis not present

## 2019-02-06 DIAGNOSIS — R531 Weakness: Secondary | ICD-10-CM | POA: Insufficient documentation

## 2019-02-06 DIAGNOSIS — C642 Malignant neoplasm of left kidney, except renal pelvis: Secondary | ICD-10-CM | POA: Insufficient documentation

## 2019-02-06 DIAGNOSIS — R253 Fasciculation: Secondary | ICD-10-CM | POA: Diagnosis not present

## 2019-02-06 NOTE — Progress Notes (Signed)
[place] OFFICE PROGRESS NOTE   Diagnosis: Renal cell carcinoma  INTERVAL HISTORY:   Mr. Koob returns prior to a scheduled visit.  He continues cabozantinib.  His significant other is present by telephone.  They report he had an episode of choking on vomit followed by a brief period of expressive aphasia on 12/27/2018.  They called EMS and by the time the EMS arrived his symptoms had resolved.  He had a 1 minute episode of expressive aphasia and eye deviation to the left last week.  He reports this occurred after feeling as though he was choking on [place] food.  No other complaint.  No headache, fever, or focal neurologic symptoms.  His pain continues to improve.  He ambulates without difficulty.  Objective:  Vital signs in last 24 hours:  Blood pressure (!) 136/58, pulse (!) 51, temperature 97.8 F (36.6 C), temperature source Temporal, resp. rate 16, height 5\' 10"  (1.778 m), weight 251 lb 1.6 oz (113.9 kg), SpO2 100 %.    HEENT: The face is symmetric Neuro: Alert and oriented, the motor exam appears intact in the upper and lower extremities bilaterally.  The face is symmetric.  Extraocular movements are intact.  He ambulates without difficulty.    Lab Results:  Lab Results  Component Value Date   WBC 6.8 01/01/2019   HGB 13.1 01/01/2019   HCT 41.4 01/01/2019   MCV 87.3 01/01/2019   PLT 197 01/01/2019   NEUTROABS 3.6 01/01/2019    CMP  Lab Results  Component Value Date   NA 137 01/01/2019   K 4.4 01/01/2019   CL 100 01/01/2019   CO2 28 01/01/2019   GLUCOSE 125 (H) 01/01/2019   BUN 19 01/01/2019   CREATININE 0.86 01/01/2019   CALCIUM 9.3 01/01/2019   PROT 6.7 01/01/2019   ALBUMIN 3.6 01/01/2019   AST 17 01/01/2019   ALT 20 01/01/2019   ALKPHOS 60 01/01/2019   BILITOT 0.3 01/01/2019   GFRNONAA >60 01/01/2019   GFRAA >60 01/01/2019    Lab Results  Component Value Date   CEA1 1.77 01/04/2016     Medications: I have reviewed the  patient's current medications.   Assessment/Plan: 1. Metastatic renal cell carcinoma   L4 mass with extraosseous extension, L4 nerve compression  Biopsy of the L4 mass 11/18/2015 confirmed metastatic renal cell carcinoma, clear cell type  CTs of the chest, abdomen, and pelvis 11/18/2015-right lower lobe nodule, expansile lytic lesion at the right 11th rib/costal vertebral junction, left renal mass, expansile lesion involving the L4 vertebra, lytic lesion at the left acetabulum, and a low-attenuation liver lesion  Initiation of SRS to L4 12/02/2015, Completed 12/12/2015  Initiation of Pazopanib11/04/2015  Pazopanibplaced on hold 02/06/2016 secondary to elevated liver enzymes  Pazopanibresumed 03/07/2016 at a dose of 400 mg daily  Pazopanibdiscontinued 03/19/2016 secondary to elevated liver enzymes  Restaging CTs 04/02/2016-stable left renal mass, decreased soft tissue component associated with the L4 metastasis, increased soft tissue component associated with the right 11th rib metastasis with increased T11 bony destruction, increased sclerosis at the left acetabulum lesion  Cycle 1 nivolumab 04/12/2016  Cycle 2 nivolumab 04/26/2016  Cycle 3 nivolumab03/16/2018  Cycle 4 nivolumab 05/24/2016  Cycle 5 nivolumab 06/08/2016  MRI lumbar spine 06/21/2016-unchanged tumor at L3, increased size of retroperitoneal lymph nodes compared to a CT from 04/02/2016  Cycle 6 nivolumab 06/22/2016  CTs chest, abdomen, and pelvis 07/04/2016-enlargement of the left renal mass, right adrenal nodule, left hilar and peritoneal lymph nodes, enlargement of  left acetabular lesion. Stable lung nodules.  Cycle 7 nivolumab05/12/2016   Cycle 8 nivolumab 07/20/2016  Cycle 9 nivolumab 08/02/2016  Cycle 10 nivolumab 08/20/2016  Restaging CT 09/03/2016 evaluation with stable disease  Cycle 11 nivolumab 09/05/2016  Cycle 12 nivolumab 09/19/2016  Cycle 13 nivolumab 10/03/2016  Cycle 14  nivolumab 10/17/2016  Cycle 15 nivolumab 10/31/2016  Cycle 16 nivolumab 11/14/2016 (changed to monthly schedule)  Cycle 17nivolumab10/24/2018  CTs 01/21/2017-increased left renal mass, increased size of adrenal metastases, increased lytic bone lesions, increased left lung nodule, persistent tumor at L4 with probable epidural component  Initiation of Cabozantinib12/04/2016  Restaging CTs 05/30/2017-decreased size of left hilar mass, left renal mass, retroperitoneal adenopathy, and adrenal metastasis. Healing bone lesions.  Cabozantinib continued  CTs 10/21/2017-interval enlargement left hilar lymph node; stable rib lesions; stable mass left renal cortex; stable mildly nodular adrenal glands; stable lytic lesions within the pelvis and spine.  Cabozantinib continued  CTs 02/21/2018- enlargement of an AP window lymph node. Mildly enlarged left hilar lymph node is unchanged. Primary renal cell carcinoma involving the upper pole of the left kidney appears similar. Stable enlarged right periaortic lymph node adjacent to the renal vessels. Stable multifocal bony metastatic disease.  Cabozantinibcontinued  CTs 07/29/2018- stable 15 mm AP window nodes; stable left hilar node; subcarinal node slightly larger; right periaortic node 16 mm, previously 12 mm; portacaval node 24 mm, previously 16 mm; 15 mm node superior to the pancreatic head has enlarged; interval increase nodularity of both adrenal glands; stable left kidney mass; multifocal bony metastatic disease not significantly changed.  Cabozantinib continued  CTs 11/06/2018- moderate improvement in thoracic adenopathy; minimal improvement abdominal adenopathy; left kidney upper pole mass and various lytic expansile bone lesions stable; mild increase in nodularity of left adrenal gland; right adrenal gland nodularity stable.  Cabozantinib continued 2. Pain secondary to #1-managed by Dr. Lovenia Shuck.Improved 3. Hypertension 3. Elevated  transaminases 02/06/2016-Pazopanibplaced on hold  Liver enzymes normal 03/07/2016 5. Port-A-Cath placement 05/16/2016 6. Malaise/anorexia 09/05/2016. Cortisol and testosterone levels low. Hydrocortisone and testosterone replacement initiated. 7. Conjunctival/scleral erythema 09/19/2016-resolved with steroid eyedrops 8. 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 9. Hypercalcemia status post Zometa 01/23/2017-resolved 10. Pruritic rash following IV contrast 10/21/2017;rash following IV contrast 02/21/2018 despite prednisone/Benadryl premedication 11.  Brief episodes of expressive aphasia October and December 2020   Disposition: [name] has metastatic renal cell carcinoma.  There is no clinical evidence of disease reaction.  He will continue cabozantinib.  The etiology of the episodes of expressive aphasia is unclear.  It is possible this was related to a partial seizure or TIA event.  We discussed the differential diagnosis.  I have a low clinical suspicion for metastatic disease involving the CNS, but this is possible.  He will contact us for recurrent neurologic symptoms and we will schedule brain imaging.  Mr. Ostrowsky will return for office visit as scheduled on 02/12/2019.  Betsy Coder, MD  02/06/2019  8:45 AM

## 2019-02-06 NOTE — Telephone Encounter (Signed)
appt already scheduled per 12/11 los. - no new appts scheduled for LOS

## 2019-02-12 ENCOUNTER — Inpatient Hospital Stay: Payer: PPO

## 2019-02-12 ENCOUNTER — Inpatient Hospital Stay (HOSPITAL_BASED_OUTPATIENT_CLINIC_OR_DEPARTMENT_OTHER): Payer: PPO | Admitting: Nurse Practitioner

## 2019-02-12 ENCOUNTER — Other Ambulatory Visit: Payer: PPO

## 2019-02-12 ENCOUNTER — Encounter: Payer: Self-pay | Admitting: Nurse Practitioner

## 2019-02-12 ENCOUNTER — Other Ambulatory Visit: Payer: Self-pay

## 2019-02-12 VITALS — BP 117/76 | HR 60 | Temp 98.7°F | Resp 17 | Ht 70.0 in | Wt 253.1 lb

## 2019-02-12 DIAGNOSIS — C642 Malignant neoplasm of left kidney, except renal pelvis: Secondary | ICD-10-CM | POA: Diagnosis not present

## 2019-02-12 DIAGNOSIS — Z95828 Presence of other vascular implants and grafts: Secondary | ICD-10-CM

## 2019-02-12 DIAGNOSIS — C649 Malignant neoplasm of unspecified kidney, except renal pelvis: Secondary | ICD-10-CM

## 2019-02-12 LAB — CBC WITH DIFFERENTIAL (CANCER CENTER ONLY)
Abs Immature Granulocytes: 0.02 10*3/uL (ref 0.00–0.07)
Basophils Absolute: 0.1 10*3/uL (ref 0.0–0.1)
Basophils Relative: 1 %
Eosinophils Absolute: 0.5 10*3/uL (ref 0.0–0.5)
Eosinophils Relative: 5 %
HCT: 41.2 % (ref 39.0–52.0)
Hemoglobin: 12.8 g/dL — ABNORMAL LOW (ref 13.0–17.0)
Immature Granulocytes: 0 %
Lymphocytes Relative: 18 %
Lymphs Abs: 1.7 10*3/uL (ref 0.7–4.0)
MCH: 27.4 pg (ref 26.0–34.0)
MCHC: 31.1 g/dL (ref 30.0–36.0)
MCV: 88.2 fL (ref 80.0–100.0)
Monocytes Absolute: 0.6 10*3/uL (ref 0.1–1.0)
Monocytes Relative: 7 %
Neutro Abs: 6.3 10*3/uL (ref 1.7–7.7)
Neutrophils Relative %: 69 %
Platelet Count: 212 10*3/uL (ref 150–400)
RBC: 4.67 MIL/uL (ref 4.22–5.81)
RDW: 16.9 % — ABNORMAL HIGH (ref 11.5–15.5)
WBC Count: 9.1 10*3/uL (ref 4.0–10.5)
nRBC: 0 % (ref 0.0–0.2)

## 2019-02-12 LAB — CMP (CANCER CENTER ONLY)
ALT: 21 U/L (ref 0–44)
AST: 17 U/L (ref 15–41)
Albumin: 3.4 g/dL — ABNORMAL LOW (ref 3.5–5.0)
Alkaline Phosphatase: 63 U/L (ref 38–126)
Anion gap: 8 (ref 5–15)
BUN: 20 mg/dL (ref 8–23)
CO2: 28 mmol/L (ref 22–32)
Calcium: 8.8 mg/dL — ABNORMAL LOW (ref 8.9–10.3)
Chloride: 104 mmol/L (ref 98–111)
Creatinine: 0.87 mg/dL (ref 0.61–1.24)
GFR, Est AFR Am: >60 mL/min (ref >60)
GFR, Estimated: >60 mL/min (ref >60)
Glucose, Bld: 113 mg/dL — ABNORMAL HIGH (ref 70–99)
Potassium: 4.3 mmol/L (ref 3.5–5.1)
Sodium: 140 mmol/L (ref 135–145)
Total Bilirubin: <0.2 mg/dL — ABNORMAL LOW (ref 0.3–1.2)
Total Protein: 6.2 g/dL — ABNORMAL LOW (ref 6.5–8.1)

## 2019-02-12 MED ORDER — SODIUM CHLORIDE 0.9% FLUSH
10.0000 mL | Freq: Once | INTRAVENOUS | Status: AC
  Administered 2019-02-12: 10 mL
  Filled 2019-02-12: qty 10

## 2019-02-12 MED ORDER — HEPARIN SOD (PORK) LOCK FLUSH 100 UNIT/ML IV SOLN
250.0000 [IU] | Freq: Once | INTRAVENOUS | Status: AC
  Administered 2019-02-12: 13:00:00 250 [IU]
  Filled 2019-02-12: qty 5

## 2019-02-12 NOTE — Progress Notes (Signed)
[name] OFFICE PROGRESS NOTE   Diagnosis: Renal cell carcinoma  INTERVAL HISTORY:   [name] returns as scheduled.  He continues cabozantinib.  He feels well.  He has had no further episodes of expressive aphasia.  He denies nausea/vomiting.  No mouth sores.  No significant diarrhea.  Appetite is better.  No mouth sores.  No unusual headaches or vision change.  He ambulates around his house without a cane.  No falls.  Objective:  Vital signs in last 24 hours:  Blood pressure 117/76, pulse 60, temperature 98.7 F (37.1 C), temperature source Temporal, resp. rate 17, height 5\' 10"  (1.778 m), weight 253 lb 1.6 oz (114.8 kg), SpO2 99 %.    HEENT: No thrush or ulcers. GI: Abdomen soft and nontender.  No hepatomegaly. Vascular: No leg edema. Neuro: Alert and oriented.  Speech is fluent.  Gait is normal. Skin: No rash. Port-A-Cath without erythema.   Lab Results:  Lab Results  Component Value Date   WBC 9.1 02/12/2019   HGB 12.8 (L) 02/12/2019   HCT 41.2 02/12/2019   MCV 88.2 02/12/2019   PLT 212 02/12/2019   NEUTROABS 6.3 02/12/2019    Imaging:  No results found.  Medications: I have reviewed the patient's current medications.  Assessment/Plan: 1. Metastatic renal cell carcinoma   L4 mass with extraosseous extension, L4 nerve compression  Biopsy of the L4 mass 11/18/2015 confirmed metastatic renal cell carcinoma, clear cell type  CTs of the chest, abdomen, and pelvis 11/18/2015-right lower lobe nodule, expansile lytic lesion at the right 11th rib/costal vertebral junction, left renal mass, expansile lesion involving the L4 vertebra, lytic lesion at the left acetabulum, and a low-attenuation liver lesion  Initiation of SRS to L4 12/02/2015, Completed 12/12/2015  Initiation of Pazopanib11/04/2015  Pazopanibplaced on hold 02/06/2016 secondary to elevated liver enzymes  Pazopanibresumed 03/07/2016 at a dose of 400 mg  daily  Pazopanibdiscontinued 03/19/2016 secondary to elevated liver enzymes  Restaging CTs 04/02/2016-stable left renal mass, decreased soft tissue component associated with the L4 metastasis, increased soft tissue component associated with the right 11th rib metastasis with increased T11 bony destruction, increased sclerosis at the left acetabulum lesion  Cycle 1 nivolumab 04/12/2016  Cycle 2 nivolumab 04/26/2016  Cycle 3 nivolumab03/16/2018  Cycle 4 nivolumab 05/24/2016  Cycle 5 nivolumab 06/08/2016  MRI lumbar spine 06/21/2016-unchanged tumor at L3, increased size of retroperitoneal lymph nodes compared to a CT from 04/02/2016  Cycle 6 nivolumab 06/22/2016  CTs chest, abdomen, and pelvis 07/04/2016-enlargement of the left renal mass, right adrenal nodule, left hilar and peritoneal lymph nodes, enlargement of left acetabular lesion. Stable lung nodules.  Cycle 7 nivolumab05/12/2016   Cycle 8 nivolumab 07/20/2016  Cycle 9 nivolumab 08/02/2016  Cycle 10 nivolumab 08/20/2016  Restaging CT 09/03/2016 evaluation with stable disease  Cycle 11 nivolumab 09/05/2016  Cycle 12 nivolumab 09/19/2016  Cycle 13 nivolumab 10/03/2016  Cycle 14 nivolumab 10/17/2016  Cycle 15 nivolumab 10/31/2016  Cycle 16 nivolumab 11/14/2016 (changed to monthly schedule)  Cycle 17nivolumab10/24/2018  CTs 01/21/2017-increased left renal mass, increased size of adrenal metastases, increased lytic bone lesions, increased left lung nodule, persistent tumor at L4 with probable epidural component  Initiation of Cabozantinib12/04/2016  Restaging CTs 05/30/2017-decreased size of left hilar mass, left renal mass, retroperitoneal adenopathy, and adrenal metastasis. Healing bone lesions.  Cabozantinib continued  CTs 10/21/2017-interval enlargement left hilar lymph node; stable rib lesions; stable mass left renal cortex; stable mildly nodular adrenal glands; stable lytic lesions within the pelvis  and spine.  Cabozantinib continued  CTs 02/21/2018-enlargement of an AP window lymph node. Mildly enlarged left hilar lymph node is unchanged. Primary renal cell carcinoma involving the upper pole of the left kidney appears similar. Stable enlarged right periaortic lymph node adjacent to the renal vessels. Stable multifocal bony metastatic disease.  Cabozantinibcontinued  CTs 07/29/2018-stable 15 mm AP window nodes; stable left hilar node; subcarinal node slightly larger; right periaortic node 16 mm, previously 12 mm; portacaval node 24 mm, previously 16 mm; 15 mm node superior to the pancreatic head has enlarged; interval increase nodularity of both adrenal glands; stable left kidney mass; multifocal bony metastatic disease not significantly changed.  Cabozantinib continued  CTs 11/06/2018- moderate improvement in thoracic adenopathy; minimal improvement abdominal adenopathy; left kidney upper pole mass and various lytic expansile bone lesions stable; mild increase in nodularity of left adrenal gland; right adrenal gland nodularity stable.  Cabozantinib continued 2. Pain secondary to #1-managed by Dr. Lovenia [name].Improved 3. Hypertension 3. Elevated transaminases 02/06/2016-Pazopanibplaced on hold  Liver enzymes normal 03/07/2016 5. Port-A-Cath placement 05/16/2016 6. Malaise/anorexia 09/05/2016. Cortisol and testosterone levels low. Hydrocortisone and testosterone replacement initiated. 7. Conjunctival/scleral erythema 09/19/2016-resolved with steroid eyedrops 8. 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 9. Hypercalcemia status post Zometa 01/23/2017-resolved 10. Pruritic rash following IV contrast 10/21/2017;rash following IV contrast 02/21/2018 despite prednisone/Benadryl premedication 11. Brief episodes of expressive aphasia October and December 2020    Disposition: [name].  There is no clinical  evidence of disease progression.  He will continue cabozantinib.  He will undergo restaging CTs in approximately 6 weeks.  We reviewed the CBC from today.  Counts adequate to continue as above.    He has had no further episodes of expressive aphasia.  He will contact the office if this occurs again.  He will return for a follow-up visit in 6 weeks, 2 to 3 days after the restaging CT scans.  He will contact the office in the interim with any problems.  Plan reviewed with Dr. Benay [name].  Phillip [name] ANP/GNP-BC   02/12/2019  2:17 PM

## 2019-02-16 ENCOUNTER — Telehealth: Payer: Self-pay | Admitting: Oncology

## 2019-02-16 NOTE — Telephone Encounter (Signed)
Scheduled per los. Called and spoke with patient. Confirmed appt 

## 2019-02-23 ENCOUNTER — Telehealth: Payer: Self-pay | Admitting: *Deleted

## 2019-02-23 NOTE — Telephone Encounter (Signed)
Phillip [name] reports that [name] had another "small" seizure last night ~ 5:30. Lasted 1 1/2- 2 minutes. Only facial involvement with eyes deviating. 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 They  wondering if he wants to do CT sooner than anticipated. [name] admits that he has been having headaches for 1 month.

## 2019-02-24 ENCOUNTER — Telehealth: Payer: Self-pay | Admitting: *Deleted

## 2019-02-24 ENCOUNTER — Telehealth: Payer: Self-pay | Admitting: Oncology

## 2019-02-24 ENCOUNTER — Other Ambulatory Visit: Payer: Self-pay | Admitting: Oncology

## 2019-02-24 DIAGNOSIS — C649 Malignant neoplasm of unspecified kidney, except renal pelvis: Secondary | ICD-10-CM

## 2019-02-24 MED ORDER — LORAZEPAM 0.5 MG PO TABS: ORAL_TABLET | ORAL | 1 refills | Status: DC

## 2019-02-24 MED ORDER — PREDNISONE 50 MG PO TABS: ORAL_TABLET | ORAL | 0 refills | Status: DC

## 2019-02-24 MED FILL — predniSONE 50 MG TABS: 50 | 1 days supply | Qty: 3 | Fill #0

## 2019-02-24 MED FILL — LORazepam 0.5 MG TABS: 0.5 | 1 days supply | Qty: 1 | Fill #0

## 2019-02-24 NOTE — Telephone Encounter (Signed)
CT head ordered for 02/24/2019 at [place] to be used for IV access

## 2019-02-24 NOTE — Telephone Encounter (Signed)
Called home and spoke with [name] and provided CT appointment at [facility] for 02/25/19 at 0745/0800. Water only by mouth after 0400. Informed her that script for Prednisone sent to [place] and also to take a benadryl 50 mg 1 hour prior to scan. She is also requesting refill on his ativan for premed for scan as well. This was called in and she was informed that he will need a driver.

## 2019-02-24 NOTE — Telephone Encounter (Signed)
Scheduled appt per 12/29 sch message- unable to reach pt . Left message with appt date and time   

## 2019-02-25 ENCOUNTER — Other Ambulatory Visit: Payer: Self-pay | Admitting: *Deleted

## 2019-02-25 ENCOUNTER — Ambulatory Visit (HOSPITAL_COMMUNITY)
Admission: RE | Admit: 2019-02-25 | Discharge: 2019-02-25 | Disposition: A | Discharge status: Home / Self Care | Payer: PPO | Source: Ambulatory Visit | Attending: Oncology | Admitting: Oncology

## 2019-02-25 ENCOUNTER — Encounter (HOSPITAL_COMMUNITY): Payer: Self-pay

## 2019-02-25 ENCOUNTER — Inpatient Hospital Stay: Payer: PPO

## 2019-02-25 ENCOUNTER — Other Ambulatory Visit: Payer: Self-pay

## 2019-02-25 DIAGNOSIS — C649 Malignant neoplasm of unspecified kidney, except renal pelvis: Secondary | ICD-10-CM

## 2019-02-25 DIAGNOSIS — G9389 Other specified disorders of brain: Secondary | ICD-10-CM | POA: Diagnosis not present

## 2019-02-25 DIAGNOSIS — G936 Cerebral edema: Secondary | ICD-10-CM | POA: Diagnosis not present

## 2019-02-25 DIAGNOSIS — C642 Malignant neoplasm of left kidney, except renal pelvis: Secondary | ICD-10-CM | POA: Diagnosis not present

## 2019-02-25 DIAGNOSIS — Z95828 Presence of other vascular implants and grafts: Secondary | ICD-10-CM

## 2019-02-25 DIAGNOSIS — C7931 Secondary malignant neoplasm of brain: Secondary | ICD-10-CM

## 2019-02-25 MED ORDER — HEPARIN SOD (PORK) LOCK FLUSH 100 UNIT/ML IV SOLN
INTRAVENOUS | Status: AC
  Filled 2019-02-25: qty 5

## 2019-02-25 MED ORDER — SODIUM CHLORIDE 0.9% FLUSH
10.0000 mL | Freq: Once | INTRAVENOUS | Status: AC
  Administered 2019-02-25: 10 mL
  Filled 2019-02-25: qty 10

## 2019-02-25 MED ORDER — IOHEXOL 300 MG/ML  SOLN
100.0000 mL | Freq: Once | INTRAMUSCULAR | Status: AC | PRN
  Administered 2019-02-25: 75 mL via INTRAVENOUS

## 2019-02-25 MED ORDER — SODIUM CHLORIDE (PF) 0.9 % IJ SOLN
INTRAMUSCULAR | Status: AC
  Filled 2019-02-25: qty 50

## 2019-02-25 MED ORDER — LEVETIRACETAM 500 MG PO TABS: 500.0000 mg | ORAL_TABLET | Freq: Two times a day (BID) | ORAL | 1 refills | Status: DC

## 2019-02-25 MED ORDER — DEXAMETHASONE 4 MG PO TABS: 8.0000 mg | ORAL_TABLET | Freq: Two times a day (BID) | ORAL | 0 refills | Status: DC

## 2019-02-25 MED ORDER — HEPARIN SOD (PORK) LOCK FLUSH 100 UNIT/ML IV SOLN
500.0000 [IU] | Freq: Once | INTRAVENOUS | Status: AC
  Administered 2019-02-25: 500 [IU] via INTRAVENOUS

## 2019-02-25 MED FILL — DEXAMETHASONE 4 MG TABLET: 4 | 21 days supply | Qty: 84 | Fill #0

## 2019-02-25 MED FILL — levETIRAcetam 500 MG TABS: 500 | 30 days supply | Qty: 60 | Fill #0

## 2019-02-25 NOTE — Progress Notes (Signed)
Decadron and Keppra sent to [place] [facility] per Dr. [name] request and scheduled visit for 12/31 at 1030. Significant other, [name] is aware.

## 2019-02-26 ENCOUNTER — Inpatient Hospital Stay (HOSPITAL_BASED_OUTPATIENT_CLINIC_OR_DEPARTMENT_OTHER): Payer: PPO | Admitting: Oncology

## 2019-02-26 ENCOUNTER — Other Ambulatory Visit: Payer: Self-pay

## 2019-02-26 VITALS — BP 143/57 | HR 51 | Temp 97.8°F | Resp 17 | Ht 70.0 in | Wt 254.3 lb

## 2019-02-26 DIAGNOSIS — C649 Malignant neoplasm of unspecified kidney, except renal pelvis: Secondary | ICD-10-CM

## 2019-02-26 DIAGNOSIS — C642 Malignant neoplasm of left kidney, except renal pelvis: Secondary | ICD-10-CM | POA: Diagnosis not present

## 2019-02-26 DIAGNOSIS — I10 Essential (primary) hypertension: Secondary | ICD-10-CM | POA: Diagnosis not present

## 2019-02-26 DIAGNOSIS — E785 Hyperlipidemia, unspecified: Secondary | ICD-10-CM | POA: Diagnosis not present

## 2019-02-26 MED ORDER — LORAZEPAM 1 MG PO TABS: 1.0000 mg | ORAL_TABLET | ORAL | 0 refills | Status: DC

## 2019-02-26 MED FILL — LORAZEPAM 1 MG TABS: 1 | 1 days supply | Qty: 1 | Fill #0

## 2019-02-26 NOTE — Progress Notes (Signed)
[place] OFFICE PROGRESS NOTE   Diagnosis: Renal cell carcinoma  INTERVAL HISTORY:   Mr. Pasley had another episode of transient left-sided facial twitching and difficulty with speaking 02/22/2019.  His significant other reports this lasted 1-2 minutes.  He otherwise feels well.  His pain remains under good control with OxyContin.  He has mild headaches.  He ambulates without difficulty.  No new complaint. A CT yesterday confirmed multiple brain metastases with edema.  He started Keppra and Decadron yesterday.  He has noted an increased appetite after the prednisone premedication for the CT and since starting Decadron.  I discussed the CT findings with Mr. Bagnato by telephone yesterday.  I contacted radiation oncology and an appointment has been scheduled. Objective:  Vital signs in last 24 hours:  Blood pressure (!) 143/57, pulse (!) 51, temperature 97.8 F (36.6 C), temperature source Temporal, resp. rate 17, height 5\' 10"  (1.778 m), weight 254 lb 4.8 oz (115.3 kg), SpO2 100 %.    HEENT: The face is symmetric  Neuro: Alert and oriented, the speech is fluent, the motor exam appears intact in the upper and lower extremities bilaterally.  Mild weakness with flexion at the right hip Skin: Dry flaking with mild erythema over the face  Portacath/PICC-without erythema  Lab Results:  Lab Results  Component Value Date   WBC 9.1 02/12/2019   HGB 12.8 (L) 02/12/2019   HCT 41.2 02/12/2019   MCV 88.2 02/12/2019   PLT 212 02/12/2019   NEUTROABS 6.3 02/12/2019    CMP  Lab Results  Component Value Date   NA 140 02/12/2019   K 4.3 02/12/2019   CL 104 02/12/2019   CO2 28 02/12/2019   GLUCOSE 113 (H) 02/12/2019   BUN 20 02/12/2019   CREATININE 0.87 02/12/2019   CALCIUM 8.8 (L) 02/12/2019   PROT 6.2 (L) 02/12/2019   ALBUMIN 3.4 (L) 02/12/2019   AST 17 02/12/2019   ALT 21 02/12/2019   ALKPHOS 63 02/12/2019   BILITOT <0.2 (L) 02/12/2019   GFRNONAA >60  02/12/2019   GFRAA >60 02/12/2019     Imaging:  CT Head W Wo Contrast  Result Date: 02/25/2019 CLINICAL DATA:  Metastatic renal cell carcinoma. EXAM: CT HEAD WITHOUT AND WITH CONTRAST TECHNIQUE: Contiguous axial images were obtained from the base of the skull through the vertex without and with intravenous contrast CONTRAST:  73mL OMNIPAQUE IOHEXOL 300 MG/ML  SOLN COMPARISON:  CT head report 04/02/2016 FINDINGS: Brain: Multiple areas of edema in the cerebral white matter, left greater than right. There is extensive edema in the left frontal lobe, left temporal lobe, and left parietal lobe. Edema is present in the deep white matter tracts of left basal ganglia. Small area of edema in the right posterior parietal lobe. These areas show abnormal enhancement compatible with metastatic disease. The enhancing lesions are peripheral and show mild enhancement. Left parietal peripheral lesion measures 15 x 20 mm. Left inferior temporal lobe lesion measures 20 mm. These would be better evaluated by MRI. 11 x 14 mm cyst in the left internal capsule which may be tumor related as well but does not show abnormal enhancement. This could also be an area of chronic infarct. Mild midline shift to the right approximately 2 mm. Ventricle size normal. No acute hemorrhage Vascular: Negative for hyperdense vessel. Normal vascular enhancement. Skull: No focal skeletal lesion Sinuses/Orbits: Negative Other: None IMPRESSION: Extensive vasogenic edema in the white matter, left greater than right. Associated peripheral enhancing mass lesions compatible with  metastatic disease. The lesions are ill-defined and mild enhancement and better evaluated with MRI of the brain without and with contrast. No associated hemorrhage Mild midline shift to the right approximately 2 mm. Electronically Signed   By: Franchot Gallo M.D.   On: 02/25/2019 09:45    Medications: I have reviewed the patient's current  medications.   Assessment/Plan: 1. Metastatic renal cell carcinoma   L4 mass with extraosseous extension, L4 nerve compression  Biopsy of the L4 mass 11/18/2015 confirmed metastatic renal cell carcinoma, clear cell type  CTs of the chest, abdomen, and pelvis 11/18/2015-right lower lobe nodule, expansile lytic lesion at the right 11th rib/costal vertebral junction, left renal mass, expansile lesion involving the L4 vertebra, lytic lesion at the left acetabulum, and a low-attenuation liver lesion  Initiation of SRS to L4 12/02/2015, Completed 12/12/2015  Initiation of Pazopanib11/04/2015  Pazopanibplaced on hold 02/06/2016 secondary to elevated liver enzymes  Pazopanibresumed 03/07/2016 at a dose of 400 mg daily  Pazopanibdiscontinued 03/19/2016 secondary to elevated liver enzymes  Restaging CTs 04/02/2016-stable left renal mass, decreased soft tissue component associated with the L4 metastasis, increased soft tissue component associated with the right 11th rib metastasis with increased T11 bony destruction, increased sclerosis at the left acetabulum lesion  Cycle 1 nivolumab 04/12/2016  Cycle 2 nivolumab 04/26/2016  Cycle 3 nivolumab03/16/2018  Cycle 4 nivolumab 05/24/2016  Cycle 5 nivolumab 06/08/2016  MRI lumbar spine 06/21/2016-unchanged tumor at L3, increased size of retroperitoneal lymph nodes compared to a CT from 04/02/2016  Cycle 6 nivolumab 06/22/2016  CTs chest, abdomen, and pelvis 07/04/2016-enlargement of the left renal mass, right adrenal nodule, left hilar and peritoneal lymph nodes, enlargement of left acetabular lesion. Stable lung nodules.  Cycle 7 nivolumab05/12/2016   Cycle 8 nivolumab 07/20/2016  Cycle 9 nivolumab 08/02/2016  Cycle 10 nivolumab 08/20/2016  Restaging CT 09/03/2016 evaluation with stable disease  Cycle 11 nivolumab 09/05/2016  Cycle 12 nivolumab 09/19/2016  Cycle 13 nivolumab 10/03/2016  Cycle 14 nivolumab  10/17/2016  Cycle 15 nivolumab 10/31/2016  Cycle 16 nivolumab 11/14/2016 (changed to monthly schedule)  Cycle 17nivolumab10/24/2018  CTs 01/21/2017-increased left renal mass, increased size of adrenal metastases, increased lytic bone lesions, increased left lung nodule, persistent tumor at L4 with probable epidural component  Initiation of Cabozantinib12/04/2016  Restaging CTs 05/30/2017-decreased size of left hilar mass, left renal mass, retroperitoneal adenopathy, and adrenal metastasis. Healing bone lesions.  Cabozantinib continued  CTs 10/21/2017-interval enlargement left hilar lymph node; stable rib lesions; stable mass left renal cortex; stable mildly nodular adrenal glands; stable lytic lesions within the pelvis and spine.  Cabozantinib continued  CTs 02/21/2018-enlargement of an AP window lymph node. Mildly enlarged left hilar lymph node is unchanged. Primary renal cell carcinoma involving the upper pole of the left kidney appears similar. Stable enlarged right periaortic lymph node adjacent to the renal vessels. Stable multifocal bony metastatic disease.  Cabozantinibcontinued  CTs 07/29/2018-stable 15 mm AP window nodes; stable left hilar node; subcarinal node slightly larger; right periaortic node 16 mm, previously 12 mm; portacaval node 24 mm, previously 16 mm; 15 mm node superior to the pancreatic head has enlarged; interval increase nodularity of both adrenal glands; stable left kidney mass; multifocal bony metastatic disease not significantly changed.  Cabozantinib continued  CTs 11/06/2018- moderate improvement in thoracic adenopathy; minimal improvement abdominal adenopathy; left kidney upper pole mass and various lytic expansile bone lesions stable; mild increase in nodularity of left adrenal gland; right adrenal gland nodularity stable.  Cabozantinib continued  Clinical evidence of partial  seizure activity December 2020  CT head 02/25/2019-extensive  vasogenic edema, left greater than right.  Peripheral enhancing masses consistent with metastases.  No hemorrhage. 2. Pain secondary to #1-managed by Dr. Lovenia Shuck.Improved 3. Hypertension 3. Elevated transaminases 02/06/2016-Pazopanibplaced on hold  Liver enzymes normal 03/07/2016 5. Port-A-Cath placement 05/16/2016 6. Malaise/anorexia 09/05/2016. Cortisol and testosterone levels low. Hydrocortisone and testosterone replacement initiated. 7. Conjunctival/scleral erythema 09/19/2016-resolved with steroid eyedrops 8. 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 9. Hypercalcemia status post Zometa 01/23/2017-resolved 10. Pruritic rash following IV contrast 10/21/2017;rash following IV contrast 02/21/2018 despite prednisone/Benadryl premedication 11. Brief episodes of expressive aphasia October and December 2020   Disposition: Ms. Chee has metastatic renal cell carcinoma.  He has been maintained on cabozantinib for the past 2 years.  There is no clinical evidence of systemic disease progression, but he has developed transient episodes of left facial twitching and word finding difficulty.  A CT yesterday confirms multiple brain metastases with edema.  I discussed the CT findings and reviewed treatment options with Mr. Baumgartner and his significant other.  We reviewed the CT images.  He understands the findings are consistent with progression of the metastatic renal cell carcinoma in the brain.  He will continue cabozantinib for now.  He is scheduled for a restaging CT evaluation later this month.  We will decide on continuing cabozantinib versus changing to a different systemic therapy depending on the CT results.  We will also consider treatment with ipilimumab and nivolumab.  He will continue Decadron with the plan to taper slowly over the next few weeks.  He will continue Keppra for seizure prophylaxis.  Mr. Delashmutt is scheduled to  see Dr. [name] next week to discuss options for brain radiation.  He is scheduled for a brain MRI on 03/11/2019.  He will be premedicated with Ativan for the MRI.  40 minutes were spent with the patient today.  The majority of the time was used counseling and coordination of care.  Betsy Coder, MD  02/26/2019  10:56 AM

## 2019-02-26 NOTE — Patient Instructions (Signed)
Discontinue hydrocortisone while on dexamethasone  Dexamethasone 8 mg twice daily x 4 days, then 8 mg in am and 4 mg in pm x 4 days, then 4 mg twice daily (continued taper per radiation oncology)  Will call in ativan for premed for your MRI scan.

## 2019-03-02 ENCOUNTER — Telehealth: Payer: Self-pay | Admitting: *Deleted

## 2019-03-02 DIAGNOSIS — C649 Malignant neoplasm of unspecified kidney, except renal pelvis: Secondary | ICD-10-CM

## 2019-03-02 MED ORDER — LIDOCAINE-PRILOCAINE 2.5-2.5 % EX CREA: TOPICAL_CREAM | CUTANEOUS | 2 refills | Status: DC

## 2019-03-02 MED ORDER — LORAZEPAM 0.5 MG PO TABS: 0.5000 mg | ORAL_TABLET | Freq: Four times a day (QID) | ORAL | 0 refills | Status: DC | PRN

## 2019-03-02 MED FILL — LORazepam 0.5 MG TABS: 0.5 | 12 days supply | Qty: 60 | Fill #0

## 2019-03-02 MED FILL — LOSARTAN-HCTZ 100-12.5 MG T: 100-12.5 | 30 days supply | Qty: 30 | Fill #0

## 2019-03-02 MED FILL — LIDOCAINE-PRILOCAINE CREAM: 2.5-2.5 | 30 days supply | Qty: 30 | Fill #0

## 2019-03-02 MED FILL — OxyCONTIN 10 MG T12A: 10 | 30 days supply | Qty: 90 | Fill #0

## 2019-03-02 NOTE — Telephone Encounter (Addendum)
On day 5 of dexamethasone ( 8 mg am & 4 mg pm). He is extremely agitated and can't sleep. Thinks he is "too high". Asking if the taper can be changed or does it need to remain as is? Per Dr. [name]: go ahead and begin 4 mg bid dosing. May have ativan 0.5 to 1 mg every 6 hours as needed. Notified [name] of new dosing order and ativan will be called to pharmacy.

## 2019-03-03 ENCOUNTER — Ambulatory Visit
Admission: RE | Admit: 2019-03-03 | Discharge: 2019-03-03 | Disposition: A | Discharge status: Home / Self Care | Payer: PPO | Source: Ambulatory Visit | Attending: Radiation Oncology | Admitting: Radiation Oncology

## 2019-03-03 ENCOUNTER — Other Ambulatory Visit: Payer: Self-pay | Admitting: *Deleted

## 2019-03-03 ENCOUNTER — Other Ambulatory Visit: Payer: Self-pay

## 2019-03-03 ENCOUNTER — Encounter: Payer: Self-pay | Admitting: Radiation Oncology

## 2019-03-03 DIAGNOSIS — C649 Malignant neoplasm of unspecified kidney, except renal pelvis: Secondary | ICD-10-CM

## 2019-03-03 DIAGNOSIS — C7931 Secondary malignant neoplasm of brain: Secondary | ICD-10-CM

## 2019-03-03 DIAGNOSIS — Z923 Personal history of irradiation: Secondary | ICD-10-CM | POA: Diagnosis not present

## 2019-03-03 DIAGNOSIS — Z510 Encounter for antineoplastic radiation therapy: Secondary | ICD-10-CM | POA: Diagnosis not present

## 2019-03-03 DIAGNOSIS — C641 Malignant neoplasm of right kidney, except renal pelvis: Secondary | ICD-10-CM | POA: Insufficient documentation

## 2019-03-03 DIAGNOSIS — Z7952 Long term (current) use of systemic steroids: Secondary | ICD-10-CM | POA: Diagnosis not present

## 2019-03-03 DIAGNOSIS — Z85528 Personal history of other malignant neoplasm of kidney: Secondary | ICD-10-CM | POA: Diagnosis not present

## 2019-03-03 MED ORDER — CABOMETYX 40 MG PO TABS: 40.0000 mg | ORAL_TABLET | Freq: Every day | ORAL | 0 refills | Status: DC

## 2019-03-03 NOTE — Progress Notes (Signed)
Radiation Oncology         [phone] ________________________________  Follow Up Outpatient Consultation - Conducted via telephone due to current COVID-19 concerns for limiting patient exposure  I spoke with the patient to conduct this consult visit via telephone to spare the patient unnecessary potential exposure in the healthcare setting during the current COVID-19 pandemic. The patient was notified in advance and was offered a [place] meeting to allow for face to face communication but unfortunately reported that they did not have the appropriate resources/technology to support such a visit and instead preferred to proceed with a telephone consult.   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  Interval Since Last Radiation:  3 years, 3 months  12/02/2015 to 12/12/2015 SBRT Treatment:  The L4 Right spinal was treated to 50 Gy in 5 fractions at 10 Gy per fraction.  Narrative: In summary this is a pleasant 74 y.o. 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 He received SBRT treatment to the spine and is under the care of Dr. [name] in medical oncology. He is currently receiving Cabometyx. He had been doing quite well in the Lumbar spine on imaging in 2018, however pain was an issue. He did consider a neuro stimulator, but did not end of having this placed as his pain improved. He also has terrible claustrophobia that he declined further MRIs for surveillance. He also has a contrast sensitivity to iodinized agents. He recently noted headaches, and weakness, seizure activity, and changes in speech and a CT of the brain on 02/25/19 that showed multiple areas of edema in  the white matter, left greater than right. There was extensive edema in the left frontal lobe, left temporal lobe, and left parietal lobe tracking also into the left basal ganglia. A small area of edema in the right posterior parietal lobe was also noted, and there was concern for enhancing lesions, a parietal lesion measuring 15 x 20 mm on the left side, and left inferior temporal lobe measured 20 mm. A cyst in the left internal capsule measuring 11 x 14 mm was also noted. A mild 2 mm left to right shift was noted. He is scheduled for 3T MRI on 03/11/19 and Dr. Vertell Limber is aware of his case.    Past Medical History:  Past Medical History:  Diagnosis Date  . Hypertension   . 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    Past Surgical History: Past Surgical History:  Procedure Laterality Date  . IR GENERIC HISTORICAL  11/18/2015   IR FLUORO GUIDED NEEDLE PLC ASPIRATION/INJECTION LOC 11/18/2015 [name], MD [facility]-INTERV RAD  . IR GENERIC HISTORICAL  05/16/2016   IR FLUORO GUIDE PORT INSERTION RIGHT 05/16/2016 [name], MD [facility]-INTERV RAD  . IR GENERIC HISTORICAL  05/16/2016   IR US GUIDE VASC ACCESS RIGHT 05/16/2016 [name], MD [facility]-INTERV RAD    Social History:  Social History   Socioeconomic History  . Marital status: Single    Spouse name: Not on file  . Number of children: Not on file  . Years of education: Not on file  . Highest education level: Not on file  Occupational History  .  Not on file  Tobacco Use  . Smoking status: Never Smoker  . Smokeless tobacco: Never Used  Substance and Sexual Activity  . Alcohol use: Yes    Alcohol/week: 1.0 standard drinks    Types: 1 Glasses of wine per week  . Drug use: Yes    Types: Marijuana  . Sexual activity: Not Currently  Other Topics Concern  . Not on file  Social History Narrative  . Not on file   Social Determinants of Health   Financial Resource Strain:   . Difficulty of Paying Living Expenses: Not on file    Food Insecurity:   . Worried About Charity fundraiser in the Last Year: Not on file  . Ran Out of Food in the Last Year: Not on file  Transportation Needs:   . Lack of Transportation (Medical): Not on file  . Lack of Transportation (Non-Medical): Not on file  Physical Activity:   . Days of Exercise per Week: Not on file  . Minutes of Exercise per Session: Not on file  Stress:   . Feeling of Stress : Not on file  Social Connections:   . Frequency of Communication with Friends and Family: Not on file  . Frequency of Social Gatherings with Friends and Family: Not on file  . Attends Religious Services: Not on file  . Active Member of Clubs or Organizations: Not on file  . Attends Archivist Meetings: Not on file  . Marital Status: Not on file  Intimate Partner Violence:   . Fear of Current or Ex-Partner: Not on file  . Emotionally Abused: Not on file  . Physically Abused: Not on file  . Sexually Abused: Not on file  The patient is in a relationship with [name]. She is a previous Veterinary surgeon within [employer]. He is retired and they live in [place].   Family History: Family History  Problem Relation Age of Onset  . Cancer Grandchild        lymphoma    ALLERGIES:  is allergic to contrast media [iodinated diagnostic agents].  Meds: Current Outpatient Medications  Medication Sig Dispense Refill  . acetaminophen (TYLENOL) 650 MG CR tablet Take 650 mg by mouth every 8 (eight) hours as needed for pain.    . cabozantinib (CABOMETYX) 40 MG tablet Take 1 tablet (40 mg total) by mouth daily. Take on an empty stomach, 1 hour before or 2 hours after meals. 30 tablet 0  . dexamethasone (DECADRON) 4 MG tablet Take 2 tablets (8 mg total) by mouth 2 (two) times daily. 84 tablet 0  . docusate sodium (COLACE) 100 MG capsule Take 100 mg by mouth 2 (two) times daily.    [name] ibuprofen (ADVIL,MOTRIN) 200 MG tablet Take 200 mg by mouth every 8 (eight) hours as needed for fever,  headache, mild pain, moderate pain or cramping.     . levETIRAcetam (KEPPRA) 500 MG tablet Take 1 tablet (500 mg total) by mouth 2 (two) times daily. 60 tablet 1  . lidocaine-prilocaine (EMLA) cream APPLY TO PORTACATH 1 HOUR PRIOR TO USE AS NEEDED 30 g 2  . losartan-hydrochlorothiazide (HYZAAR) 100-12.5 MG tablet Take 1 tablet by mouth daily.     . mirtazapine (REMERON) 30 MG tablet Take 1 tablet (30 mg total) by mouth at bedtime. 90 tablet 1  . ondansetron (ZOFRAN) 8 MG tablet TAKE 1 TABLET BY MOUTH EVERY 8 HOURS AS NEEDED FOR NAUSEA AND VOMITING 90 tablet 2  . oxyCODONE (OXYCONTIN) 10 mg  12 hr tablet Take 10 mg by mouth every morning.    . OXYCONTIN 10 MG 12 hr tablet Take 20 mg by mouth at bedtime.  0  . polyethylene glycol (MIRALAX / GLYCOLAX) 17 g packet Take 17 g by mouth daily.    . Testosterone 20.25 MG/ACT (1.62%) GEL APPLY 2 PUMPS ONCE A DAY AS DIRECTED 75 g 3  . diphenhydrAMINE (BENADRYL) 50 MG tablet Take 1 tablet (50 mg total) by mouth as directed. Take Benadryl 50 mg by mouth 1 hour before CT scan. (Patient not taking: Reported on 03/03/2019) 1 tablet 0  . hydrocortisone (CORTEF) 10 MG tablet Take 2 tablets by mouth in the morning and take 1 tablet in the evening (Patient not taking: Reported on 03/03/2019) 270 tablet 3  . LORazepam (ATIVAN) 0.5 MG tablet Take 1-2 tablets (0.5-1 mg total) by mouth every 6 (six) hours as needed for anxiety. (Patient not taking: Reported on 03/03/2019) 60 tablet 0  . LORazepam (ATIVAN) 1 MG tablet Take 1 tablet (1 mg total) by mouth as directed. Take 1 hour prior to MRI (Patient not taking: Reported on 03/03/2019) 1 tablet 0  . predniSONE (DELTASONE) 50 MG tablet Take 50 mg 13 hours, 7 hours and 1 hour prior to CT scan. (Patient not taking: Reported on 03/03/2019) 3 tablet 0   No current facility-administered medications for this encounter.   Facility-Administered Medications Ordered in Other Encounters  Medication Dose Route Frequency Provider Last Rate Last  Admin  . sodium chloride flush (NS) 0.9 % injection 10 mL  10 mL Intravenous PRN [name], MD   10 mL at 06/22/16 8341    Physical Findings: Unable to assess due to encounter type.  Lab Findings: Lab Results  Component Value Date   WBC 9.1 02/12/2019   HGB 12.8 (L) 02/12/2019   HCT 41.2 02/12/2019   MCV 88.2 02/12/2019   PLT 212 02/12/2019     Radiographic Findings: CT Head W Wo Contrast  Result Date: 02/25/2019 CLINICAL DATA:  Metastatic renal cell carcinoma. EXAM: CT HEAD WITHOUT AND WITH CONTRAST TECHNIQUE: Contiguous axial images were obtained from the base of the skull through the vertex without and with intravenous contrast CONTRAST:  62m OMNIPAQUE IOHEXOL 300 MG/ML  SOLN COMPARISON:  CT head report 04/02/2016 FINDINGS: Brain: Multiple areas of edema in the cerebral white matter, left greater than right. There is extensive edema in the left frontal lobe, left temporal lobe, and left parietal lobe. Edema is present in the deep white matter tracts of left basal ganglia. Small area of edema in the right posterior parietal lobe. These areas show abnormal enhancement compatible with metastatic disease. The enhancing lesions are peripheral and show mild enhancement. Left parietal peripheral lesion measures 15 x 20 mm. Left inferior temporal lobe lesion measures 20 mm. These would be better evaluated by MRI. 11 x 14 mm cyst in the left internal capsule which may be tumor related as well but does not show abnormal enhancement. This could also be an area of chronic infarct. Mild midline shift to the right approximately 2 mm. Ventricle size normal. No acute hemorrhage Vascular: Negative for hyperdense vessel. Normal vascular enhancement. Skull: No focal skeletal lesion Sinuses/Orbits: Negative Other: None IMPRESSION: Extensive vasogenic edema in the white matter, left greater than right. Associated peripheral enhancing mass lesions compatible with metastatic disease. The lesions are  ill-defined and mild enhancement and better evaluated with MRI of the brain without and with contrast. No associated hemorrhage Mild midline shift  to the right approximately 2 mm. Electronically Signed   By: Franchot Gallo M.D.   On: 02/25/2019 09:45    Impression/Plan: 1. 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  2. Claustrophobia. The patient has Ativan and is aware to take this 30 minutes prior to MRI, simulation, and treatment. We can subsequently order MRIs for surveillance with general anesthesia which he is in agreement to. We can also consider imaging the spine if we are to do this with the brain.  3. CT contrast sensitivity. As he is already taking dexamethasone 4 mg BID, we would not give additional prednisone, but would recommend the benadryl protocol 50 mg 1 hour prior to his simulation. 4. GI prophylaxis. He will start taking pepcid or prilosec regularly while on dexamethasone.  5. Need for family member to accompany him. Dr. [name] has authorized the patient to come with his significant other, [name] given the nature of his disease.  6. Research project for understanding the patient experience.  In our department we are currently looking at a better way to understand patient experience for those undergoing radiotherapy.  He was counseled on the opportunity to participate in our Bells mask project, after discussing the logistics of this, the patient is interested in participating if he's eligible following the MRI.   Given current concerns for patient exposure during the COVID-19 pandemic, this encounter was conducted via telephone.  The patient has given verbal consent for this type of encounter. The time spent during this encounter was 60 minutes and 50% of that time was spent in the coordination of his care. The attendants for this meeting include Windell Norfolk, LPN, Dr. [name], Shona Simpson, [facility] and Johncarlos Efrain Sella and [name]. During the  encounter,  Windell Norfolk, LPN, Dr. [name], Shona Simpson [facility] were located at [facility] Radiation Oncology Department.  Ellie Efrain Sella and [name]  were located at home.   The above documentation reflects my direct findings during this shared patient visit. Please see the separate note by Dr. [name] on this date for the remainder of the patient's plan of care.     [name], PAC

## 2019-03-04 ENCOUNTER — Telehealth: Payer: Self-pay

## 2019-03-04 NOTE — Telephone Encounter (Signed)
Oral Oncology Patient Advocate Encounter  Received notification from [facility] that prior authorization for Cabometyx is required.  PA submitted on CoverMyMeds Key [id number] Status is pending  Oral Oncology Clinic will continue to follow.  [place] Patient [place] Phone [phone] Fax [phone] 03/04/2019 9:14 AM

## 2019-03-05 ENCOUNTER — Other Ambulatory Visit: Payer: PPO

## 2019-03-06 DIAGNOSIS — E785 Hyperlipidemia, unspecified: Secondary | ICD-10-CM | POA: Diagnosis not present

## 2019-03-06 DIAGNOSIS — C649 Malignant neoplasm of unspecified kidney, except renal pelvis: Secondary | ICD-10-CM | POA: Diagnosis not present

## 2019-03-06 DIAGNOSIS — I10 Essential (primary) hypertension: Secondary | ICD-10-CM | POA: Diagnosis not present

## 2019-03-09 MED FILL — CABOMETYX 40 MG TABLET: 40 | 30 days supply | Qty: 30 | Fill #0

## 2019-03-11 ENCOUNTER — Ambulatory Visit (HOSPITAL_COMMUNITY)
Admission: RE | Admit: 2019-03-11 | Discharge: 2019-03-11 | Disposition: A | Discharge status: Home / Self Care | Payer: PPO | Source: Ambulatory Visit | Attending: Radiation Oncology | Admitting: Radiation Oncology

## 2019-03-11 ENCOUNTER — Ambulatory Visit (HOSPITAL_COMMUNITY): Payer: PPO

## 2019-03-11 ENCOUNTER — Other Ambulatory Visit: Payer: Self-pay

## 2019-03-11 DIAGNOSIS — C7931 Secondary malignant neoplasm of brain: Secondary | ICD-10-CM | POA: Diagnosis not present

## 2019-03-11 DIAGNOSIS — C649 Malignant neoplasm of unspecified kidney, except renal pelvis: Secondary | ICD-10-CM | POA: Diagnosis not present

## 2019-03-11 MED ORDER — GADOBUTROL 1 MMOL/ML IV SOLN
10.0000 mL | Freq: Once | INTRAVENOUS | Status: AC | PRN
  Administered 2019-03-11: 10 mL via INTRAVENOUS

## 2019-03-11 MED ORDER — HEPARIN SOD (PORK) LOCK FLUSH 100 UNIT/ML IV SOLN
500.0000 [IU] | INTRAVENOUS | Status: AC | PRN
  Administered 2019-03-11: 500 [IU]
  Filled 2019-03-11: qty 5

## 2019-03-12 ENCOUNTER — Inpatient Hospital Stay: Payer: PPO | Attending: Oncology | Admitting: Nurse Practitioner

## 2019-03-12 ENCOUNTER — Ambulatory Visit
Admission: RE | Admit: 2019-03-12 | Discharge: 2019-03-12 | Disposition: A | Discharge status: Home / Self Care | Payer: PPO | Source: Ambulatory Visit | Attending: Radiation Oncology | Admitting: Radiation Oncology

## 2019-03-12 ENCOUNTER — Other Ambulatory Visit: Payer: Self-pay

## 2019-03-12 ENCOUNTER — Encounter: Payer: Self-pay | Admitting: Nurse Practitioner

## 2019-03-12 VITALS — BP 138/71 | HR 65 | Temp 97.5°F | Resp 17 | Ht 70.0 in | Wt 279.9 lb

## 2019-03-12 VITALS — BP 159/65 | HR 56 | Temp 97.5°F | Resp 18 | Ht 70.0 in | Wt 249.0 lb

## 2019-03-12 DIAGNOSIS — C642 Malignant neoplasm of left kidney, except renal pelvis: Secondary | ICD-10-CM | POA: Diagnosis not present

## 2019-03-12 DIAGNOSIS — C7951 Secondary malignant neoplasm of bone: Secondary | ICD-10-CM | POA: Insufficient documentation

## 2019-03-12 DIAGNOSIS — C7931 Secondary malignant neoplasm of brain: Secondary | ICD-10-CM

## 2019-03-12 DIAGNOSIS — Z79899 Other long term (current) drug therapy: Secondary | ICD-10-CM | POA: Insufficient documentation

## 2019-03-12 DIAGNOSIS — R600 Localized edema: Secondary | ICD-10-CM | POA: Insufficient documentation

## 2019-03-12 DIAGNOSIS — Z510 Encounter for antineoplastic radiation therapy: Secondary | ICD-10-CM | POA: Diagnosis not present

## 2019-03-12 DIAGNOSIS — Z7952 Long term (current) use of systemic steroids: Secondary | ICD-10-CM | POA: Diagnosis not present

## 2019-03-12 DIAGNOSIS — C649 Malignant neoplasm of unspecified kidney, except renal pelvis: Secondary | ICD-10-CM

## 2019-03-12 DIAGNOSIS — Z9225 Personal history of immunosupression therapy: Secondary | ICD-10-CM | POA: Insufficient documentation

## 2019-03-12 DIAGNOSIS — I10 Essential (primary) hypertension: Secondary | ICD-10-CM | POA: Insufficient documentation

## 2019-03-12 MED ORDER — SODIUM CHLORIDE 0.9% FLUSH
10.0000 mL | INTRAVENOUS | Status: AC
  Administered 2019-03-12: 11:00:00 10 mL via INTRAVENOUS

## 2019-03-12 MED ORDER — HEPARIN SOD (PORK) LOCK FLUSH 100 UNIT/ML IV SOLN
500.0000 [IU] | Freq: Once | INTRAVENOUS | Status: AC
  Administered 2019-03-12: 500 [IU] via INTRAVENOUS

## 2019-03-12 NOTE — Progress Notes (Signed)
Has armband been applied?  Yes.    Does patient have an allergy to IV contrast dye?: Yes.     Has patient ever received premedication for IV contrast dye?: Yes.     Does patient take metformin?: No.  If patient does take metformin when was the last dose: n/a     IV site: subclavian right, condition no redness  Has IV site been added to flowsheet?  Yes.    BP (!) 159/65   Pulse (!) 56   Temp (!) 97.5 F (36.4 C)   Resp 18   Ht 5\' 10"  (1.778 m)   Wt 249 lb (112.9 kg)   SpO2 99%   BMI 35.73 kg/m

## 2019-03-12 NOTE — Progress Notes (Addendum)
[facility] OFFICE PROGRESS NOTE   Diagnosis: Renal cell carcinoma  INTERVAL HISTORY:   [name] returns as scheduled.  He continues dexamethasone 4 mg twice daily.  He denies nausea/vomiting.  No headaches.  No seizure activity.  No visual hallucinations.  He notes mild leg swelling.  Objective:  Vital signs in last 24 hours:  Blood pressure 138/71, pulse 65, temperature (!) 97.5 F (36.4 C), temperature source Temporal, resp. rate 17, height 5\' 10"  (1.778 m), weight 279 lb 14.4 oz (127 kg), SpO2 98 %.    HEENT: No thrush. GI: Abdomen soft and nontender. Vascular: Trace edema at the lower legs bilaterally.    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 EXAM: MRI HEAD WITHOUT AND WITH CONTRAST TECHNIQUE: Multiplanar, multiecho pulse sequences of the brain and surrounding structures were obtained without and with intravenous contrast. CONTRAST:  83mL GADAVIST GADOBUTROL 1 MMOL/ML IV SOLN COMPARISON:  None. FINDINGS: Brain: 7 enhancing brain masses with chronic blood products, consistent with metastatic disease: 1. 20 mm and along the high left frontal cortex (this includes a small anterior daughter lesion). 2. 12 mm along the high and parasagittal left frontal cortex 3. 9 mm along the left parietal cortex 4. 16 mm along the left occipital cortex 5. 31 mm along the inferior left temporal lobe with broad contact along the dura 6. 11 mm along the right occipital cortex 7. 4 mm along the posterior right frontal cortex. Vasogenic edema to a moderate degree, greatest in the left cerebral hemisphere especially at the temporal lobe. 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 Mild chronic small vessel ischemic change. No hydrocephalus or collection. Vascular: Normal flow voids and vascular enhancements Skull and upper cervical spine: Negative for marrow lesion Sinuses/Orbits: Negative IMPRESSION: Seven brain metastases as listed above, with up to moderate vasogenic edema Electronically Signed   By: Monte Fantasia M.D.   On: 03/12/2019 04:46    Medications: I have reviewed the patient's current medications.  Assessment/Plan: 1. Metastatic renal cell carcinoma   L4 mass with extraosseous extension, L4 nerve compression  Biopsy of the L4 mass 11/18/2015 confirmed metastatic renal cell carcinoma, clear cell type  CTs of the chest, abdomen, and pelvis 11/18/2015-right lower lobe nodule, expansile lytic lesion at the right 11th rib/costal vertebral junction, left renal mass, expansile lesion involving the L4 vertebra, lytic lesion at the left acetabulum, and a low-attenuation liver lesion  Initiation of SRS to L4 12/02/2015, Completed 12/12/2015  Initiation of Pazopanib11/04/2015  Pazopanibplaced on hold 02/06/2016 secondary to elevated liver enzymes  Pazopanibresumed 03/07/2016 at a dose of 400 mg daily  Pazopanibdiscontinued 03/19/2016 secondary to elevated liver enzymes  Restaging CTs 04/02/2016-stable left renal mass, decreased soft tissue component associated with the L4 metastasis, increased soft tissue component associated with the right 11th rib metastasis with increased T11 bony destruction, increased sclerosis at the left acetabulum lesion  Cycle 1 nivolumab 04/12/2016  Cycle 2 nivolumab 04/26/2016  Cycle 3 nivolumab03/16/2018  Cycle 4 nivolumab 05/24/2016  Cycle 5 nivolumab 06/08/2016  MRI lumbar spine 06/21/2016-unchanged tumor at L3, increased size of retroperitoneal lymph nodes compared to a  CT from 04/02/2016  Cycle 6 nivolumab 06/22/2016  CTs chest, abdomen, and pelvis 07/04/2016-enlargement of the left renal mass, right  adrenal nodule, left hilar and peritoneal lymph nodes, enlargement of left acetabular lesion. Stable lung nodules.  Cycle 7 nivolumab05/12/2016   Cycle 8 nivolumab 07/20/2016  Cycle 9 nivolumab 08/02/2016  Cycle 10 nivolumab 08/20/2016  Restaging CT 09/03/2016 evaluation with stable disease  Cycle 11 nivolumab 09/05/2016  Cycle 12 nivolumab 09/19/2016  Cycle 13 nivolumab 10/03/2016  Cycle 14 nivolumab 10/17/2016  Cycle 15 nivolumab 10/31/2016  Cycle 16 nivolumab 11/14/2016 (changed to monthly schedule)  Cycle 17nivolumab10/24/2018  CTs 01/21/2017-increased left renal mass, increased size of adrenal metastases, increased lytic bone lesions, increased left lung nodule, persistent tumor at L4 with probable epidural component  Initiation of Cabozantinib12/04/2016  Restaging CTs 05/30/2017-decreased size of left hilar mass, left renal mass, retroperitoneal adenopathy, and adrenal metastasis. Healing bone lesions.  Cabozantinib continued  CTs 10/21/2017-interval enlargement left hilar lymph node; stable rib lesions; stable mass left renal cortex; stable mildly nodular adrenal glands; stable lytic lesions within the pelvis and spine.  Cabozantinib continued  CTs 02/21/2018-enlargement of an AP window lymph node. Mildly enlarged left hilar lymph node is unchanged. Primary renal cell carcinoma involving the upper pole of the left kidney appears similar. Stable enlarged right periaortic lymph node adjacent to the renal vessels. Stable multifocal bony metastatic disease.  Cabozantinibcontinued  CTs 07/29/2018-stable 15 mm AP window nodes; stable left hilar node; subcarinal node slightly larger; right periaortic node 16 mm, previously 12 mm; portacaval node 24 mm, previously 16 mm; 15 mm node superior to the pancreatic head has enlarged; interval increase nodularity of both adrenal glands; stable left kidney mass; multifocal bony metastatic disease not significantly  changed.  Cabozantinib continued  CTs 11/06/2018-moderate improvement in thoracic adenopathy; minimal improvement abdominal adenopathy; left kidney upper pole mass and various lytic expansile bone lesions stable; mild increase in nodularity of left adrenal gland; right adrenal gland nodularity stable.  Cabozantinib continued  Clinical evidence of partial seizure activity December 2020  CT head 02/25/2019-extensive vasogenic edema, left greater than right.  Peripheral enhancing masses consistent with metastases. No hemorrhage.  MRI brain 03/11/2019-7 enhancing brain masses consistent with metastatic disease 2. Pain secondary to #1-managed by Dr. Lovenia Shuck.Improved 3. Hypertension 3. Elevated transaminases 02/06/2016-Pazopanibplaced on hold  Liver enzymes normal 03/07/2016 5. Port-A-Cath placement 05/16/2016 6. Malaise/anorexia 09/05/2016. Cortisol and testosterone levels low. Hydrocortisone and testosterone replacement initiated. 7. Conjunctival/scleral erythema 09/19/2016-resolved with steroid eyedrops 8. 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 9. Hypercalcemia status post Zometa 01/23/2017-resolved 10. Pruritic rash following IV contrast 10/21/2017;rash following IV contrast 02/21/2018 despite prednisone/Benadryl premedication 11. Brief episodes of expressive aphasia October and December 2020    Disposition: [name] appears unchanged.  He underwent SRS CT simulation earlier today and is scheduled for [facility] treatment on 03/19/2019.  He is currently on dexamethasone 4 mg twice daily.  We will defer steroid tapering to radiation oncology.  For now he will continue cabozantinib.  He is scheduled for restaging CTs at the end of this month.  He will return for a follow-up appointment on 03/30/2019 to review the CT results.  He will contact the office in the interim with any problems.  Patient seen with Dr. [name].  MRI  images reviewed on the computer with Mr. Snowdon and his significant other.    [name] ANP/GNP-BC   03/12/2019  3:19 PM This was a shared visit with Lattie Haw  Tyishia Aune.  We discussed the brain MRI findings and reviewed the images with Mr. Behning and his significant other.  He is scheduled to undergo SRS treatment next week.  He will continue cabozantinib until he undergoes restaging CTs in a few weeks.  We will decide on continuing cabozantinib based on the restaging CT results.  Radiation oncology will recommend further tapering the Decadron.  He continues Keppra for seizure prophylaxis.  Julieanne Manson, MD

## 2019-03-12 NOTE — Progress Notes (Signed)
Patient's port was flushed per protocol and de accessed by Harlow Asa, RN. Anderson Malta, RN reports the needle was intact upon removal and the patient tolerate de access well.

## 2019-03-17 ENCOUNTER — Telehealth: Payer: Self-pay | Admitting: Radiation Oncology

## 2019-03-17 ENCOUNTER — Telehealth: Payer: Self-pay | Admitting: *Deleted

## 2019-03-17 NOTE — Telephone Encounter (Signed)
Dr. [name] is adding two more SRS brain treatments. Patient wants to move his CT scan and OV with Dr. [name] a few days if that is OK. Called back and informed Phillip [name] that that is OK. Provided the phone # for central scheduling to make change. Will adjust MD visit after the scan is rescheduled.

## 2019-03-17 NOTE — Telephone Encounter (Signed)
We spoke with the patient and his significant other and let them know that we need to treat the three larger areas with three fractions, but the other four could be treated in a single fraction. He would also like his CT scan pushed out a few days which would also need to move his appointment with Dr. [name]. I left a message for Dr. Gearldine Shown nurse.

## 2019-03-18 ENCOUNTER — Ambulatory Visit: Payer: PPO | Admitting: Radiation Oncology

## 2019-03-18 ENCOUNTER — Other Ambulatory Visit: Payer: Self-pay | Admitting: *Deleted

## 2019-03-19 ENCOUNTER — Other Ambulatory Visit: Payer: Self-pay

## 2019-03-19 ENCOUNTER — Ambulatory Visit
Admission: RE | Admit: 2019-03-19 | Discharge: 2019-03-19 | Disposition: A | Discharge status: Home / Self Care | Payer: PPO | Source: Ambulatory Visit | Attending: Radiation Oncology | Admitting: Radiation Oncology

## 2019-03-19 VITALS — BP 140/73 | HR 61 | Resp 18

## 2019-03-19 DIAGNOSIS — C7931 Secondary malignant neoplasm of brain: Secondary | ICD-10-CM | POA: Diagnosis not present

## 2019-03-19 DIAGNOSIS — Z510 Encounter for antineoplastic radiation therapy: Secondary | ICD-10-CM | POA: Diagnosis not present

## 2019-03-19 DIAGNOSIS — C801 Malignant (primary) neoplasm, unspecified: Secondary | ICD-10-CM | POA: Diagnosis not present

## 2019-03-19 NOTE — Progress Notes (Signed)
Patient in post brain SRS denies any issues or complaints of. Wife is in to sit with the patient. Ginger ale given as requested. Discussed him going home to rest when discharged. Recommended he take some ibuprophen to help with any possible headache. Wife and patient aware of what to call the hospital for. BP 140/73 (BP Location: Left Arm, Patient Position: Sitting, Cuff Size: Large)   Pulse 61   Resp 18   SpO2 97%

## 2019-03-19 NOTE — Progress Notes (Signed)
Discharged to home after 30 minutes no complaints of voiced.

## 2019-03-19 NOTE — Op Note (Signed)
  Name: Phillip [name]  MRN: [id number]  Date: 03/19/2019   DOB: [birth year]  Stereotactic Radiosurgery Operative Note  PRE-OPERATIVE DIAGNOSIS:  Multiple Brain Metastases  POST-OPERATIVE DIAGNOSIS:  Multiple Brain Metastases  PROCEDURE:  Stereotactic Radiosurgery  SURGEON:  Peggyann Shoals, MD  NARRATIVE: The patient underwent a radiation treatment planning session in the radiation oncology simulation suite under the care of the radiation oncology physician and physicist.  I participated closely in the radiation treatment planning afterwards. The patient underwent planning CT which was fused to 3T high resolution MRI with 1 mm axial slices.  These images were fused on the planning system.  We contoured the gross target volumes and subsequently expanded this to yield the Planning Target Volume. I actively participated in the planning process.  I helped to define and review the target contours and also the contours of the optic pathway, eyes, brainstem and selected nearby organs at risk.  All the dose constraints for critical structures were reviewed and compared to AAPM Task Group 101.  The prescription dose conformity was reviewed.  I approved the plan electronically.    Accordingly, Phillip [name] was brought to the TrueBeam stereotactic radiation treatment linac and placed in the custom immobilization mask.  The patient was aligned according to the IR fiducial markers with BrainLab Exactrac, then orthogonal x-rays were used in ExacTrac with the 6DOF robotic table and the shifts were made to align the patient  Phillip [name] received stereotactic radiosurgery uneventfully.    Lesions treated:  7   Complex lesions treated:  0 (>3.5 cm, <58mm of optic path, or within the brainstem)   The detailed description of the procedure is recorded in the radiation oncology procedure note.  I was present for the duration of the procedure.  DISPOSITION:  Following delivery, the patient was  transported to nursing in stable condition and monitored for possible acute effects to be discharged to home in stable condition with follow-up in one month.  Peggyann Shoals, MD 03/19/2019 4:04 PM

## 2019-03-20 ENCOUNTER — Telehealth: Payer: Self-pay | Admitting: *Deleted

## 2019-03-20 NOTE — Telephone Encounter (Signed)
Notified Phillip [name] that since he is on decadron bid, he does not need prednisone for premed for scan. Still needs to take the benadryl.

## 2019-03-23 ENCOUNTER — Telehealth: Payer: Self-pay | Admitting: *Deleted

## 2019-03-23 ENCOUNTER — Ambulatory Visit
Admission: RE | Admit: 2019-03-23 | Discharge: 2019-03-23 | Disposition: A | Discharge status: Home / Self Care | Payer: PPO | Source: Ambulatory Visit | Attending: Radiation Oncology | Admitting: Radiation Oncology

## 2019-03-23 ENCOUNTER — Other Ambulatory Visit: Payer: Self-pay

## 2019-03-23 ENCOUNTER — Other Ambulatory Visit: Payer: Self-pay | Admitting: Oncology

## 2019-03-23 VITALS — BP 127/62 | HR 58 | Temp 98.7°F | Resp 20

## 2019-03-23 DIAGNOSIS — Z510 Encounter for antineoplastic radiation therapy: Secondary | ICD-10-CM | POA: Diagnosis not present

## 2019-03-23 DIAGNOSIS — C7931 Secondary malignant neoplasm of brain: Secondary | ICD-10-CM

## 2019-03-23 MED ORDER — CLOTRIMAZOLE 10 MG MT TROC: 10.0000 mg | Freq: Four times a day (QID) | OROMUCOSAL | 0 refills | Status: DC

## 2019-03-23 MED ORDER — FLUCONAZOLE 100 MG PO TABS: 100.0000 mg | ORAL_TABLET | Freq: Every day | ORAL | 0 refills | Status: DC

## 2019-03-23 MED FILL — levETIRAcetam 500 MG TABS: 500 | 30 days supply | Qty: 60 | Fill #1

## 2019-03-23 MED FILL — TESTOSTERONE 20.25 MG/ACT (: 20.25 MG/AC | 30 days supply | Qty: 75 | Fill #1

## 2019-03-23 MED FILL — DEXAMETHASONE 4 MG TABLET: 4 | 30 days supply | Qty: 60 | Fill #0

## 2019-03-23 MED FILL — FLUCONAZOLE 100 MG TAB: 100 | 4 days supply | Qty: 4 | Fill #0

## 2019-03-23 MED FILL — CLOTRIMAZOLE 10 MG TROCHE: 10 | 30 days supply | Qty: 120 | Fill #0

## 2019-03-23 NOTE — Progress Notes (Signed)
  Radiation Oncology         [phone] ________________________________  Name: Phillip [name] MRN: [id number]  Date: 03/23/2019  DOB: 06/11/1945  Stereotactic Treatment Procedure Note  SPECIAL TREATMENT PROCEDURE  Outpatient    ICD-10-CM   1. Brain metastases ([place])  C79.31     3D TREATMENT PLANNING AND DOSIMETRY:  The patient's radiation plan was reviewed and approved by neurosurgery and radiation oncology prior to treatment.  It showed 3-dimensional radiation distributions overlaid onto the planning CT/MRI image set.  The [facility] for the target structures as well as the organs at risk were reviewed. The documentation of the 3D plan and dosimetry are filed in the radiation oncology EMR.  NARRATIVE:  Phillip [name] was brought to the TrueBeam stereotactic radiation treatment machine and placed supine on the CT couch. The head frame was applied, and the patient was set up for stereotactic radiosurgery.  Neurosurgery was present for the set-up and delivery  SIMULATION VERIFICATION:  In the couch zero-angle position, the patient underwent Exactrac imaging using the Brainlab system with orthogonal KV images.  These were carefully aligned and repeated to confirm treatment position for each of the isocenters.  The Exactrac snap film verification was repeated at each couch angle.  SPECIAL TREATMENT PROCEDURE: Phillip [name] received stereotactic radiosurgery to the following targets: ExacTrac, 6 VMAT beams Max dose=132.9% PTV1 Lt Temporal 64mm 9Gy (second fraction), PTV2 Lt Occipital 66mm 9Gy (second fraction), PTV6 Lt Frontal 70mm 9Gy (second fraction)  ExacTrac Snap verification was performed for each couch angle.  This constitutes a special treatment procedure due to the ablative dose delivered and the technical nature of treatment.  This highly technical modality of treatment ensures that the ablative dose is centered on the patient's tumor while sparing normal tissues from  excessive dose and risk of detrimental effects.  STEREOTACTIC TREATMENT MANAGEMENT:  Following delivery, the patient was transported to nursing in stable condition and monitored for possible acute effects.  Vital signs were recorded BP 127/62 (BP Location: Left Arm, Patient Position: Sitting, Cuff Size: Large)   Pulse (!) 58   Temp 98.7 F (37.1 C)   Resp 20   SpO2 99% . The patient tolerated treatment without significant acute effects, and was discharged to home in stable condition.    PLAN: Follow-up for 3rd fraction this week.  ________________________________   Eppie Gibson, MD

## 2019-03-23 NOTE — Telephone Encounter (Addendum)
Reports mouth tender and sore and [name] sees white patches in mouth.  Per Dr. [name]: Fluconazole 100 mg daily x 4 days, then Mycelex troch qid while on decadron. Notified [name] and that scripts have been sent to [place].

## 2019-03-25 ENCOUNTER — Encounter: Payer: Self-pay | Admitting: Radiation Oncology

## 2019-03-25 ENCOUNTER — Other Ambulatory Visit: Payer: Self-pay

## 2019-03-25 ENCOUNTER — Ambulatory Visit
Admission: RE | Admit: 2019-03-25 | Discharge: 2019-03-25 | Disposition: A | Discharge status: Home / Self Care | Payer: PPO | Source: Ambulatory Visit | Attending: Radiation Oncology | Admitting: Radiation Oncology

## 2019-03-25 DIAGNOSIS — C7931 Secondary malignant neoplasm of brain: Secondary | ICD-10-CM | POA: Diagnosis not present

## 2019-03-25 DIAGNOSIS — Z510 Encounter for antineoplastic radiation therapy: Secondary | ICD-10-CM | POA: Diagnosis not present

## 2019-03-25 NOTE — Progress Notes (Signed)
  Radiation Oncology         [phone] ________________________________  Name: Phillip [name] MRN: [id number]  Date of Service: 03/25/2019  DOB: [birth year]  Dexamethasone Taper Instructions:  Dexamethasone 4 mg tablets   On 03/26/19 begin taking 1/2 tablet (2mg ) by mouth twice a day  On 04/02/19 begin taking 1/2 tablet (2mg ) by mouth once daily  On 04/09/19 begin taking 1/2 tablet (2mg ) by mouth every other day and then stop on 04/20/19.  Call our office if while tapering this medication you develop weakness, headaches, changes in speech, hearing, shaking, or dizziness.

## 2019-03-25 NOTE — Progress Notes (Signed)
Phillip [name] rested with [name] for 15 minutes following his SRS treatment. Patient denies headache, dizziness, nausea, diplopia or ringing in the ears. Denies fatigue. Patient without complaints. Understands to avoid strenuous activity for the next 24 hours and call [phone] with needs. He was given taper instructions for dexamethasone.  Patient and wife verbalized understanding of instructions.    BP (!) 144/63 (BP Location: Left Arm)   Pulse 61   Temp 98.5 F (36.9 C) (Oral)   Resp 20   SpO2 98%    [name] M. Leonie Green, BSN

## 2019-03-26 ENCOUNTER — Other Ambulatory Visit: Payer: Self-pay | Admitting: Oncology

## 2019-03-26 ENCOUNTER — Ambulatory Visit (HOSPITAL_COMMUNITY): Payer: PPO

## 2019-03-26 ENCOUNTER — Other Ambulatory Visit: Payer: PPO

## 2019-03-26 MED ORDER — LORAZEPAM 0.5 MG PO TABS: 0.5000 mg | ORAL_TABLET | Freq: Four times a day (QID) | ORAL | 2 refills | Status: DC | PRN

## 2019-03-26 MED FILL — LORazepam 0.5 MG TABS: 0.5 | 12 days supply | Qty: 60 | Fill #0

## 2019-03-30 ENCOUNTER — Ambulatory Visit: Payer: PPO | Admitting: Oncology

## 2019-03-30 MED FILL — LOSARTAN-HCTZ 100-12.5 MG T: 100-12.5 | 30 days supply | Qty: 30 | Fill #1

## 2019-03-31 ENCOUNTER — Telehealth: Payer: Self-pay | Admitting: *Deleted

## 2019-03-31 NOTE — Telephone Encounter (Signed)
Any update on the PA status?

## 2019-03-31 NOTE — Telephone Encounter (Signed)
Oral Oncology Patient Advocate Encounter  Prior Authorization for Cabometyx has been approved.    PA# [id number] Effective dates: 03/04/19 through 03/03/20   Oral Oncology Clinic will continue to follow.    [place] [facility] [place] Phone [phone] Fax [phone] 03/31/2019 7:44 PM

## 2019-03-31 NOTE — Telephone Encounter (Signed)
CT scan for tomorrow was cancelled since radiologist declines to do scan without prednisone prep, despite he is on dexamethasone 4 mg daily. Should lab tomorrow and OV 2/4 be rescheduled for after the scan? Will MD be ordering the prednisone?

## 2019-04-01 ENCOUNTER — Telehealth: Payer: Self-pay | Admitting: *Deleted

## 2019-04-01 ENCOUNTER — Inpatient Hospital Stay: Payer: PPO

## 2019-04-01 ENCOUNTER — Ambulatory Visit (HOSPITAL_COMMUNITY): Admission: RE | Admit: 2019-04-01 | Payer: PPO | Source: Ambulatory Visit

## 2019-04-01 DIAGNOSIS — C649 Malignant neoplasm of unspecified kidney, except renal pelvis: Secondary | ICD-10-CM

## 2019-04-01 MED ORDER — PREDNISONE 50 MG PO TABS: ORAL_TABLET | ORAL | 0 refills | Status: DC

## 2019-04-01 MED FILL — predniSONE 50 MG TABS: 50 | 1 days supply | Qty: 3 | Fill #0

## 2019-04-01 MED FILL — OxyCONTIN 10 MG T12A: 10 | 30 days supply | Qty: 90 | Fill #0

## 2019-04-01 MED FILL — CABOMETYX 40 MG TABLET: 40 | 30 days supply | Qty: 30 | Fill #1

## 2019-04-01 NOTE — Telephone Encounter (Signed)
Noted that scan rescheduled for 04/09/19 at 1130. Scheduling message for lab on 2/11 and OV on 2/12 at 11:00.

## 2019-04-01 NOTE — Telephone Encounter (Signed)
Per Dr. [name]: Will premed w/prednisone per radiologist request. Hold the dexamethsone on days he is taking the prednisone. Reschedule OV after scan. Notified Ms. Wise and provided phone # to reschedule scan. Prednisone refilled. Asking when he should be resuming his oral hydrocortisone?

## 2019-04-02 ENCOUNTER — Inpatient Hospital Stay: Payer: PPO | Admitting: Oncology

## 2019-04-03 DIAGNOSIS — C649 Malignant neoplasm of unspecified kidney, except renal pelvis: Secondary | ICD-10-CM | POA: Diagnosis not present

## 2019-04-03 DIAGNOSIS — I10 Essential (primary) hypertension: Secondary | ICD-10-CM | POA: Diagnosis not present

## 2019-04-03 DIAGNOSIS — E785 Hyperlipidemia, unspecified: Secondary | ICD-10-CM | POA: Diagnosis not present

## 2019-04-06 ENCOUNTER — Telehealth: Payer: Self-pay | Admitting: *Deleted

## 2019-04-06 NOTE — Telephone Encounter (Signed)
Called to ask to have port accessed for his scan on 2/11 and confirm he is also have CT chest? Only see abd/pelvis ordered on Mychart. Called back and confirmed that chest is included in his orders and port access scheduled for 2/11 at 1015,

## 2019-04-09 ENCOUNTER — Inpatient Hospital Stay: Payer: PPO | Attending: Oncology

## 2019-04-09 ENCOUNTER — Inpatient Hospital Stay: Payer: PPO

## 2019-04-09 ENCOUNTER — Ambulatory Visit (HOSPITAL_COMMUNITY)
Admission: RE | Admit: 2019-04-09 | Discharge: 2019-04-09 | Disposition: A | Discharge status: Home / Self Care | Payer: PPO | Source: Ambulatory Visit | Attending: Nurse Practitioner | Admitting: Nurse Practitioner

## 2019-04-09 ENCOUNTER — Other Ambulatory Visit: Payer: Self-pay

## 2019-04-09 DIAGNOSIS — C642 Malignant neoplasm of left kidney, except renal pelvis: Secondary | ICD-10-CM | POA: Diagnosis not present

## 2019-04-09 DIAGNOSIS — R590 Localized enlarged lymph nodes: Secondary | ICD-10-CM | POA: Insufficient documentation

## 2019-04-09 DIAGNOSIS — R600 Localized edema: Secondary | ICD-10-CM | POA: Insufficient documentation

## 2019-04-09 DIAGNOSIS — N2889 Other specified disorders of kidney and ureter: Secondary | ICD-10-CM | POA: Insufficient documentation

## 2019-04-09 DIAGNOSIS — C7989 Secondary malignant neoplasm of other specified sites: Secondary | ICD-10-CM | POA: Insufficient documentation

## 2019-04-09 DIAGNOSIS — C649 Malignant neoplasm of unspecified kidney, except renal pelvis: Secondary | ICD-10-CM

## 2019-04-09 DIAGNOSIS — G939 Disorder of brain, unspecified: Secondary | ICD-10-CM | POA: Diagnosis not present

## 2019-04-09 DIAGNOSIS — I10 Essential (primary) hypertension: Secondary | ICD-10-CM | POA: Insufficient documentation

## 2019-04-09 DIAGNOSIS — Z9225 Personal history of immunosupression therapy: Secondary | ICD-10-CM | POA: Diagnosis not present

## 2019-04-09 DIAGNOSIS — E279 Disorder of adrenal gland, unspecified: Secondary | ICD-10-CM | POA: Diagnosis not present

## 2019-04-09 DIAGNOSIS — R911 Solitary pulmonary nodule: Secondary | ICD-10-CM | POA: Insufficient documentation

## 2019-04-09 DIAGNOSIS — I700 Atherosclerosis of aorta: Secondary | ICD-10-CM | POA: Insufficient documentation

## 2019-04-09 DIAGNOSIS — Z85528 Personal history of other malignant neoplasm of kidney: Secondary | ICD-10-CM | POA: Diagnosis not present

## 2019-04-09 DIAGNOSIS — Z95828 Presence of other vascular implants and grafts: Secondary | ICD-10-CM

## 2019-04-09 DIAGNOSIS — Z79899 Other long term (current) drug therapy: Secondary | ICD-10-CM | POA: Insufficient documentation

## 2019-04-09 DIAGNOSIS — C7951 Secondary malignant neoplasm of bone: Secondary | ICD-10-CM | POA: Insufficient documentation

## 2019-04-09 LAB — CBC WITH DIFFERENTIAL (CANCER CENTER ONLY)
Abs Immature Granulocytes: 0.1 10*3/uL — ABNORMAL HIGH (ref 0.00–0.07)
Basophils Absolute: 0 10*3/uL (ref 0.0–0.1)
Basophils Relative: 0 %
Eosinophils Absolute: 0 10*3/uL (ref 0.0–0.5)
Eosinophils Relative: 0 %
HCT: 42 % (ref 39.0–52.0)
Hemoglobin: 13.8 g/dL (ref 13.0–17.0)
Immature Granulocytes: 2 %
Lymphocytes Relative: 23 %
Lymphs Abs: 1.3 10*3/uL (ref 0.7–4.0)
MCH: 29.1 pg (ref 26.0–34.0)
MCHC: 32.9 g/dL (ref 30.0–36.0)
MCV: 88.4 fL (ref 80.0–100.0)
Monocytes Absolute: 0.2 10*3/uL (ref 0.1–1.0)
Monocytes Relative: 4 %
Neutro Abs: 3.9 10*3/uL (ref 1.7–7.7)
Neutrophils Relative %: 71 %
Platelet Count: 144 10*3/uL — ABNORMAL LOW (ref 150–400)
RBC: 4.75 MIL/uL (ref 4.22–5.81)
RDW: 17.5 % — ABNORMAL HIGH (ref 11.5–15.5)
WBC Count: 5.4 10*3/uL (ref 4.0–10.5)
nRBC: 0 % (ref 0.0–0.2)

## 2019-04-09 LAB — CMP (CANCER CENTER ONLY)
ALT: 82 U/L — ABNORMAL HIGH (ref 0–44)
AST: 29 U/L (ref 15–41)
Albumin: 3.1 g/dL — ABNORMAL LOW (ref 3.5–5.0)
Alkaline Phosphatase: 49 U/L (ref 38–126)
Anion gap: 9 (ref 5–15)
BUN: 18 mg/dL (ref 8–23)
CO2: 28 mmol/L (ref 22–32)
Calcium: 8.3 mg/dL — ABNORMAL LOW (ref 8.9–10.3)
Chloride: 99 mmol/L (ref 98–111)
Creatinine: 0.73 mg/dL (ref 0.61–1.24)
GFR, Est AFR Am: >60 mL/min (ref >60)
GFR, Estimated: >60 mL/min (ref >60)
Glucose, Bld: 243 mg/dL — ABNORMAL HIGH (ref 70–99)
Potassium: 4.3 mmol/L (ref 3.5–5.1)
Sodium: 136 mmol/L (ref 135–145)
Total Bilirubin: 0.2 mg/dL — ABNORMAL LOW (ref 0.3–1.2)
Total Protein: 5.7 g/dL — ABNORMAL LOW (ref 6.5–8.1)

## 2019-04-09 LAB — TSH: TSH: 0.149 u[IU]/mL — ABNORMAL LOW (ref 0.320–4.118)

## 2019-04-09 MED ORDER — HEPARIN SOD (PORK) LOCK FLUSH 100 UNIT/ML IV SOLN
INTRAVENOUS | Status: AC
  Filled 2019-04-09: qty 5

## 2019-04-09 MED ORDER — IOHEXOL 300 MG/ML  SOLN
100.0000 mL | Freq: Once | INTRAMUSCULAR | Status: AC | PRN
  Administered 2019-04-09: 100 mL via INTRAVENOUS

## 2019-04-09 MED ORDER — SODIUM CHLORIDE (PF) 0.9 % IJ SOLN
INTRAMUSCULAR | Status: AC
  Filled 2019-04-09: qty 50

## 2019-04-09 MED ORDER — HEPARIN SOD (PORK) LOCK FLUSH 100 UNIT/ML IV SOLN
500.0000 [IU] | Freq: Once | INTRAVENOUS | Status: AC
  Administered 2019-04-09: 12:00:00 500 [IU] via INTRAVENOUS

## 2019-04-09 MED ORDER — SODIUM CHLORIDE 0.9% FLUSH
10.0000 mL | Freq: Once | INTRAVENOUS | Status: AC
  Administered 2019-04-09: 10 mL
  Filled 2019-04-09: qty 10

## 2019-04-09 MED ORDER — HEPARIN SOD (PORK) LOCK FLUSH 100 UNIT/ML IV SOLN: 500.0000 [IU] | Freq: Once | INTRAVENOUS | Status: DC

## 2019-04-10 ENCOUNTER — Inpatient Hospital Stay (HOSPITAL_BASED_OUTPATIENT_CLINIC_OR_DEPARTMENT_OTHER): Payer: PPO | Admitting: Oncology

## 2019-04-10 ENCOUNTER — Other Ambulatory Visit: Payer: Self-pay

## 2019-04-10 VITALS — BP 142/69 | HR 57 | Temp 98.0°F | Resp 17 | Ht 70.0 in | Wt 246.8 lb

## 2019-04-10 DIAGNOSIS — C649 Malignant neoplasm of unspecified kidney, except renal pelvis: Secondary | ICD-10-CM

## 2019-04-10 DIAGNOSIS — C642 Malignant neoplasm of left kidney, except renal pelvis: Secondary | ICD-10-CM | POA: Diagnosis not present

## 2019-04-10 NOTE — Progress Notes (Signed)
[place] OFFICE PROGRESS NOTE   Diagnosis: Renal cell carcinoma  INTERVAL HISTORY:   Phillip [name] completed SRS treatment to multiple brain lesions.  The last treatment was given 03/25/2019.  He is completing a Decadron taper.  He reports tolerating the radiation well.  No further neurologic symptoms.  He feels well.  He has noted increase in leg swelling since beginning Decadron.  Thrush improved after Diflucan.  Good appetite.  Objective:  Vital signs in last 24 hours:  Blood pressure (!) 142/69, pulse (!) 57, temperature 98 F (36.7 C), temperature source Temporal, resp. rate 17, height 5\' 10"  (1.778 m), weight 246 lb 12.8 oz (111.9 kg), SpO2 97 %.    HEENT: No thrush GI: Nontender, no hepatomegaly Vascular: 1+ pitting edema below the knee bilaterally Neuro: Alert and oriented   Portacath/PICC-without erythema  Lab Results:  Lab Results  Component Value Date   WBC 5.4 04/09/2019   HGB 13.8 04/09/2019   HCT 42.0 04/09/2019   MCV 88.4 04/09/2019   PLT 144 (L) 04/09/2019   NEUTROABS 3.9 04/09/2019    CMP  Lab Results  Component Value Date   NA 136 04/09/2019   K 4.3 04/09/2019   CL 99 04/09/2019   CO2 28 04/09/2019   GLUCOSE 243 (H) 04/09/2019   BUN 18 04/09/2019   CREATININE 0.73 04/09/2019   CALCIUM 8.3 (L) 04/09/2019   PROT 5.7 (L) 04/09/2019   ALBUMIN 3.1 (L) 04/09/2019   AST 29 04/09/2019   ALT 82 (H) 04/09/2019   ALKPHOS 49 04/09/2019   BILITOT 0.2 (L) 04/09/2019   GFRNONAA >60 04/09/2019   GFRAA >60 04/09/2019    Lab Results  Component Value Date   CEA1 1.77 01/04/2016    Lab Results  Component Value Date   INR 0.98 05/16/2016    Imaging:  CT Chest W Contrast  Result Date: 04/09/2019 CLINICAL DATA:  Patient with history of metastatic renal cell carcinoma. Follow-up exam. EXAM: CT CHEST, ABDOMEN, AND PELVIS WITH CONTRAST TECHNIQUE: Multidetector CT imaging of the chest, abdomen and pelvis was performed following the  standard protocol during bolus administration of intravenous contrast. CONTRAST:  158mL OMNIPAQUE IOHEXOL 300 MG/ML  SOLN COMPARISON:  CT CP 11/06/2018 FINDINGS: CT CHEST FINDINGS Cardiovascular: Normal heart size. Thoracic aortic and coronary arterial vascular calcifications. Mediastinum/Nodes: Interval decrease in size of AP window lymph node measuring 0.9 cm (image 37; series 7), previously 1.2 cm. No additional mediastinal, axillary or hilar lymphadenopathy. Lungs/Pleura: Central airways are patent. Focal atelectasis within the lingula. Interval development of a 9 mm nodule within the right lower lobe (image 78; series 11). No large area pulmonary consolidation. No pleural effusion or pneumothorax. New 3 mm nodule medial right lower lobe (image 85; series 11). Musculoskeletal: Stable metastatic lesions including the right second rib, eleventh rib and T1 vertebral body. Thoracic spine degenerative changes. CT ABDOMEN PELVIS FINDINGS Hepatobiliary: Liver is normal in size and contour. Unchanged hepatic cyst within the central liver. Multiple stones in the gallbladder lumen. Continued wall thickening involving the gallbladder fundus. Pancreas: Unremarkable Spleen: Unremarkable Adrenals/Urinary Tract: Slight interval decrease in size of left adrenal gland nodularity with a reference nodule measuring 1.2 cm (image 104; series 7), previously 1.5 cm. Decreased right adrenal nodularity measuring 0.9 cm (image 98; series 7), previously 1.4 cm. Interval decrease in size of upper pole mass involving left kidney with central hypoenhancement measuring 3.2 x 3.0 cm, previously 3.6 x 2.9 cm (image 111; series 7). Stomach/Bowel: Normal morphology of the  stomach. No evidence bowel obstruction. No free fluid or free intraperitoneal air. Vascular/Lymphatic: Normal caliber abdominal aorta. Peripheral calcified atherosclerotic plaque. Interval decrease in porta hepatic adenopathy a reference node measuring 0.7 cm (image 101; series  7), previously 1.6 cm. Similar-appearing rind of soft tissue surrounding abdominal aorta (image 132; series 7). Unchanged 0.9 cm left inguinal node (image 215; series 7). Reproductive: Enlarged heterogeneous prostate. Other: None. Musculoskeletal: Interval decrease in lytic soft tissue involving left acetabulum (image 194; series 7). Similar-appearing lytic lesion involving the right aspect of the L4 vertebral body. Similar-appearing wedge compression deformity of the L1 vertebral body. IMPRESSION: 1. Interval decrease in size of upper pole left renal mass. 2. Interval decrease in size of AP window and porta hepatic adenopathy. 3. Interval decrease in size of bilateral adrenal nodules. 4. Interval development of a 9 mm nodule within the right lower lobe. Recommend attention on follow-up. 5. Improved lytic lesion involving the left acetabulum. Additional metastatic osseous lesions are grossly similar when compared to prior exam. Aortic Atherosclerosis (ICD10-I70.0). Electronically Signed   By: Lovey Newcomer M.D.   On: 04/09/2019 15:46   CT Abdomen Pelvis W Contrast  Result Date: 04/09/2019 CLINICAL DATA:  Patient with history of metastatic renal cell carcinoma. Follow-up exam. EXAM: CT CHEST, ABDOMEN, AND PELVIS WITH CONTRAST TECHNIQUE: Multidetector CT imaging of the chest, abdomen and pelvis was performed following the standard protocol during bolus administration of intravenous contrast. CONTRAST:  192mL OMNIPAQUE IOHEXOL 300 MG/ML  SOLN COMPARISON:  CT CP 11/06/2018 FINDINGS: CT CHEST FINDINGS Cardiovascular: Normal heart size. Thoracic aortic and coronary arterial vascular calcifications. Mediastinum/Nodes: Interval decrease in size of AP window lymph node measuring 0.9 cm (image 37; series 7), previously 1.2 cm. No additional mediastinal, axillary or hilar lymphadenopathy. Lungs/Pleura: Central airways are patent. Focal atelectasis within the lingula. Interval development of a 9 mm nodule within the right  lower lobe (image 78; series 11). No large area pulmonary consolidation. No pleural effusion or pneumothorax. New 3 mm nodule medial right lower lobe (image 85; series 11). Musculoskeletal: Stable metastatic lesions including the right second rib, eleventh rib and T1 vertebral body. Thoracic spine degenerative changes. CT ABDOMEN PELVIS FINDINGS Hepatobiliary: Liver is normal in size and contour. Unchanged hepatic cyst within the central liver. Multiple stones in the gallbladder lumen. Continued wall thickening involving the gallbladder fundus. Pancreas: Unremarkable Spleen: Unremarkable Adrenals/Urinary Tract: Slight interval decrease in size of left adrenal gland nodularity with a reference nodule measuring 1.2 cm (image 104; series 7), previously 1.5 cm. Decreased right adrenal nodularity measuring 0.9 cm (image 98; series 7), previously 1.4 cm. Interval decrease in size of upper pole mass involving left kidney with central hypoenhancement measuring 3.2 x 3.0 cm, previously 3.6 x 2.9 cm (image 111; series 7). Stomach/Bowel: Normal morphology of the stomach. No evidence bowel obstruction. No free fluid or free intraperitoneal air. Vascular/Lymphatic: Normal caliber abdominal aorta. Peripheral calcified atherosclerotic plaque. Interval decrease in porta hepatic adenopathy a reference node measuring 0.7 cm (image 101; series 7), previously 1.6 cm. Similar-appearing rind of soft tissue surrounding abdominal aorta (image 132; series 7). Unchanged 0.9 cm left inguinal node (image 215; series 7). Reproductive: Enlarged heterogeneous prostate. Other: None. Musculoskeletal: Interval decrease in lytic soft tissue involving left acetabulum (image 194; series 7). Similar-appearing lytic lesion involving the right aspect of the L4 vertebral body. Similar-appearing wedge compression deformity of the L1 vertebral body. IMPRESSION: 1. Interval decrease in size of upper pole left renal mass. 2. Interval decrease in size of AP  window and porta hepatic adenopathy. 3. Interval decrease in size of bilateral adrenal nodules. 4. Interval development of a 9 mm nodule within the right lower lobe. Recommend attention on follow-up. 5. Improved lytic lesion involving the left acetabulum. Additional metastatic osseous lesions are grossly similar when compared to prior exam. Aortic Atherosclerosis (ICD10-I70.0). Electronically Signed   By: Lovey Newcomer M.D.   On: 04/09/2019 15:46    Medications: I have reviewed the patient's current medications.   Assessment/Plan: 1. Metastatic renal cell carcinoma   L4 mass with extraosseous extension, L4 nerve compression  Biopsy of the L4 mass 11/18/2015 confirmed metastatic renal cell carcinoma, clear cell type  CTs of the chest, abdomen, and pelvis 11/18/2015-right lower lobe nodule, expansile lytic lesion at the right 11th rib/costal vertebral junction, left renal mass, expansile lesion involving the L4 vertebra, lytic lesion at the left acetabulum, and a low-attenuation liver lesion  Initiation of SRS to L4 12/02/2015, Completed 12/12/2015  Initiation of Pazopanib11/04/2015  Pazopanibplaced on hold 02/06/2016 secondary to elevated liver enzymes  Pazopanibresumed 03/07/2016 at a dose of 400 mg daily  Pazopanibdiscontinued 03/19/2016 secondary to elevated liver enzymes  Restaging CTs 04/02/2016-stable left renal mass, decreased soft tissue component associated with the L4 metastasis, increased soft tissue component associated with the right 11th rib metastasis with increased T11 bony destruction, increased sclerosis at the left acetabulum lesion  Cycle 1 nivolumab 04/12/2016  Cycle 2 nivolumab 04/26/2016  Cycle 3 nivolumab03/16/2018  Cycle 4 nivolumab 05/24/2016  Cycle 5 nivolumab 06/08/2016  MRI lumbar spine 06/21/2016-unchanged tumor at L3, increased size of retroperitoneal lymph nodes compared to a CT from 04/02/2016  Cycle 6 nivolumab 06/22/2016  CTs chest,  abdomen, and pelvis 07/04/2016-enlargement of the left renal mass, right adrenal nodule, left hilar and peritoneal lymph nodes, enlargement of left acetabular lesion. Stable lung nodules.  Cycle 7 nivolumab05/12/2016   Cycle 8 nivolumab 07/20/2016  Cycle 9 nivolumab 08/02/2016  Cycle 10 nivolumab 08/20/2016  Restaging CT 09/03/2016 evaluation with stable disease  Cycle 11 nivolumab 09/05/2016  Cycle 12 nivolumab 09/19/2016  Cycle 13 nivolumab 10/03/2016  Cycle 14 nivolumab 10/17/2016  Cycle 15 nivolumab 10/31/2016  Cycle 16 nivolumab 11/14/2016 (changed to monthly schedule)  Cycle 17nivolumab10/24/2018  CTs 01/21/2017-increased left renal mass, increased size of adrenal metastases, increased lytic bone lesions, increased left lung nodule, persistent tumor at L4 with probable epidural component  Initiation of Cabozantinib12/04/2016  Restaging CTs 05/30/2017-decreased size of left hilar mass, left renal mass, retroperitoneal adenopathy, and adrenal metastasis. Healing bone lesions.  Cabozantinib continued  CTs 10/21/2017-interval enlargement left hilar lymph node; stable rib lesions; stable mass left renal cortex; stable mildly nodular adrenal glands; stable lytic lesions within the pelvis and spine.  Cabozantinib continued  CTs 02/21/2018-enlargement of an AP window lymph node. Mildly enlarged left hilar lymph node is unchanged. Primary renal cell carcinoma involving the upper pole of the left kidney appears similar. Stable enlarged right periaortic lymph node adjacent to the renal vessels. Stable multifocal bony metastatic disease.  Cabozantinibcontinued  CTs 07/29/2018-stable 15 mm AP window nodes; stable left hilar node; subcarinal node slightly larger; right periaortic node 16 mm, previously 12 mm; portacaval node 24 mm, previously 16 mm; 15 mm node superior to the pancreatic head has enlarged; interval increase nodularity of both adrenal glands; stable left  kidney mass; multifocal bony metastatic disease not significantly changed.  Cabozantinib continued  CTs 11/06/2018-moderate improvement in thoracic adenopathy; minimal improvement abdominal adenopathy; left kidney upper pole mass and various lytic expansile bone lesions stable; mild  increase in nodularity of left adrenal gland; right adrenal gland nodularity stable.  Cabozantinib continued  Clinical evidence of partial seizure activity December 2020  CT head 02/25/2019-extensive vasogenic edema, left greater than right.  Peripheral enhancing masses consistent with metastases. No hemorrhage.  MRI brain 03/11/2019-7 enhancing brain masses consistent with metastatic disease  SRS to 7 brain lesions, treatment given 03/19/2019, 03/23/2019, and 03/25/2019  CTs 04/09/2019-decrease in left renal mass, bilateral adrenal nodules, AP window and porta hepatic adenopathy.  New 9 mm right lower lobe nodule.  Improved lytic lesion of the left acetabulum.  Other bone lesions are stable.  Cabozantinib continued 2. Pain secondary to #1-managed by Dr. Lovenia Shuck.Improved 3. Hypertension 3. Elevated transaminases 02/06/2016-Pazopanibplaced on hold  Liver enzymes normal 03/07/2016 5. Port-A-Cath placement 05/16/2016 6. Malaise/anorexia 09/05/2016. Cortisol and testosterone levels low. Hydrocortisone and testosterone replacement initiated. 7. Conjunctival/scleral erythema 09/19/2016-resolved with steroid eyedrops 8. 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 9. Hypercalcemia status post Zometa 01/23/2017-resolved 10. Pruritic rash following IV contrast 10/21/2017;rash following IV contrast 02/21/2018 despite prednisone/Benadryl premedication 11. Brief episodes of expressive aphasia October and December 2020  Disposition: Phillip [name] has completed SRS to multiple brain lesions.  He is completing a Decadron taper.  He has no neurologic symptoms.  The  restaging CTs reveal stable to improved metastatic disease in the chest, abdomen, and bones.  He will continue cabozantinib.  I reviewed the CT images.  Phillip [name] will return for an office and lab visit in 1 month.  Betsy Coder, MD  04/10/2019  3:37 PM

## 2019-04-13 ENCOUNTER — Telehealth: Payer: Self-pay | Admitting: *Deleted

## 2019-04-13 MED ORDER — DOXYCYCLINE HYCLATE 100 MG PO TABS: 100.0000 mg | ORAL_TABLET | Freq: Two times a day (BID) | ORAL | 0 refills | Status: DC

## 2019-04-13 MED FILL — DOXYCYCLINE HYCLATE 100 MG: 100 | 7 days supply | Qty: 14 | Fill #0

## 2019-04-13 NOTE — Telephone Encounter (Addendum)
Reports the area of scratches noted on last visit has now turned red and is extending up his right calf. The right lower leg is more swollen than his left leg and area of red discoloration is warm to touch--she is concerned he may have cellulitis. He does not have a fever. Per Dr. [name]: Start doxycycline 100 mg bid X 7 days. Notified [name] and she agrees to call if leg swelling worsens or he does not respond to antibiotic.

## 2019-04-14 ENCOUNTER — Telehealth: Payer: Self-pay | Admitting: Oncology

## 2019-04-14 NOTE — Telephone Encounter (Signed)
Scheduled per los. Called and spoke with patient. Confirmed appt 

## 2019-04-20 ENCOUNTER — Encounter: Payer: Self-pay | Admitting: Radiation Oncology

## 2019-04-20 ENCOUNTER — Other Ambulatory Visit: Payer: Self-pay

## 2019-04-20 ENCOUNTER — Ambulatory Visit
Admission: RE | Admit: 2019-04-20 | Discharge: 2019-04-20 | Disposition: A | Discharge status: Home / Self Care | Payer: PPO | Source: Ambulatory Visit | Attending: Radiation Oncology | Admitting: Radiation Oncology

## 2019-04-20 DIAGNOSIS — Z510 Encounter for antineoplastic radiation therapy: Secondary | ICD-10-CM | POA: Insufficient documentation

## 2019-04-20 DIAGNOSIS — L03115 Cellulitis of right lower limb: Secondary | ICD-10-CM

## 2019-04-20 DIAGNOSIS — C7931 Secondary malignant neoplasm of brain: Secondary | ICD-10-CM

## 2019-04-20 DIAGNOSIS — C641 Malignant neoplasm of right kidney, except renal pelvis: Secondary | ICD-10-CM | POA: Insufficient documentation

## 2019-04-20 NOTE — Progress Notes (Signed)
  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  Interval Since Last Radiation:  4 weeks    03/19/19-03/25/19 SRS Treatment: PTV1 Lt Temporal 86mm 27 Gy in 3 fractions PTV2 Lt Occipital 57mm  27 Gy in 3 fractions PTV6 Lt Frontal 31mm  27 Gy in 3 fractions  03/19/19 SRS Treatment:  PTV3 Rt Occipital 48mm  PTV4 Lt Frontal 51mm   PTV5 Rt Frontal 58mm  PTV7 Lt Parietal 33mm  All of these were treated to 20 Gy in 1 fraction  12/02/2015 to 12/12/2015 SBRT Treatment:  The L4 Right spinal was treated to 50 Gy in 5 fractions at 10 Gy per fraction.  Narrative:  The patient was contacted today for routine follow-up. During treatment he did very well with radiotherapy and did not have significant desquamation. He reports he is doing well and his speech and movement is improved. He is also recovering from a cellulitis from the lower extremity.  Impression/Plan: 1. 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 The patient has been doing well since completion of radiotherapy. We discussed that we would be happy to continue to follow him in our brain and spine oncology conference. He will need an MRI in about 2 months. He may be interested in general anesthesia and will let our navigator know also continue to follow up with Dr. [name] in medical oncology.     [name], PAC

## 2019-04-21 ENCOUNTER — Other Ambulatory Visit: Payer: Self-pay | Admitting: Oncology

## 2019-04-21 MED FILL — HYDROCORTISONE 10 MG TABS: 10 | 30 days supply | Qty: 90 | Fill #6

## 2019-04-21 MED FILL — levETIRAcetam 500 MG TABS: 500 | 30 days supply | Qty: 60 | Fill #0

## 2019-04-21 MED FILL — MIRTAZAPINE 30 MG TABLET: 30 | 90 days supply | Qty: 90 | Fill #1

## 2019-04-21 MED FILL — TESTOSTERONE 20.25 MG/ACT (: 20.25 MG/AC | 30 days supply | Qty: 75 | Fill #2

## 2019-04-24 MED FILL — LOSARTAN-HCTZ 100-12.5 MG T: 100-12.5 | 30 days supply | Qty: 30 | Fill #2

## 2019-04-27 ENCOUNTER — Ambulatory Visit: Payer: PPO | Attending: Internal Medicine

## 2019-04-27 DIAGNOSIS — Z23 Encounter for immunization: Secondary | ICD-10-CM | POA: Insufficient documentation

## 2019-04-27 NOTE — Progress Notes (Signed)
   Covid-19 Vaccination Clinic  Name:  Phillip [name]    MRN: [id number] DOB: April 07, 1945  04/27/2019  [name] was observed post Covid-19 immunization for 15 minutes without incidence. He was provided with Vaccine Information Sheet and instruction to access the V-Safe system.   [name] was instructed to call 911 with any severe reactions post vaccine: [name] Difficulty breathing  . Swelling of your face and throat  . A fast heartbeat  . A bad rash all over your body  . Dizziness and weakness    Immunizations Administered    Name Date Dose VIS Date Route   Pfizer COVID-19 Vaccine 04/27/2019 12:27 PM 0.3 mL 02/06/2019 Intramuscular   Manufacturer: [place]   Lot: [id number]   [place]ender: [id number]

## 2019-04-28 MED FILL — OxyCONTIN 10 MG T12A: 10 | 30 days supply | Qty: 90 | Fill #0

## 2019-04-30 MED FILL — CABOMETYX 40 MG TABLET: 40 | 30 days supply | Qty: 30 | Fill #2

## 2019-05-01 NOTE — Progress Notes (Signed)
  Radiation Oncology         [phone] ________________________________  Name: Phillip [name] MRN: [id number]  Date: 03/12/2019  DOB: 08/14/1945  DIAGNOSIS:     ICD-10-CM   1. Brain metastases ([place])  C79.31     NARRATIVE:  The patient was brought to the [facility].  Identity was confirmed.  All relevant records and images related to the planned course of therapy were reviewed.  The patient freely provided informed written consent to proceed with treatment after reviewing the details related to the planned course of therapy. The consent form was witnessed and verified by the simulation staff. Intravenous access was established for contrast administration. Then, the patient was set-up in a stable reproducible supine position for radiation therapy.  A relocatable thermoplastic stereotactic head frame was fabricated for precise immobilization.  CT images were obtained.  Surface markings were placed.  The CT images were loaded into the planning software and fused with the patient's targeting MRI scan.  Then the target and avoidance structures were contoured.  Treatment planning then occurred.  The radiation prescription was entered and confirmed.  I have requested 3D planning  I have requested a DVH of the following structures: Brain stem, brain, left eye, right eye, lenses, optic chiasm, target volumes, uninvolved brain, and normal tissue.    SPECIAL TREATMENT PROCEDURE:  The planned course of therapy using radiation constitutes a special treatment procedure. Special care is required in the management of this patient for the following reasons. This treatment constitutes a Special Treatment Procedure for the following reason: High dose per fraction requiring special monitoring for increased toxicities of treatment including daily imaging.  The special nature of the planned course of radiotherapy will require increased physician supervision and oversight to ensure patient's safety with  optimal treatment outcomes.  PLAN:  The patient will receive 20 Gy in 1 fraction to PTVs 3/4/5/7 and 27 Gy in 3 fractions to PTVs 1/2/6.[name]   ------------------------------------------------  Jodelle Gross, MD, PhD

## 2019-05-02 NOTE — Progress Notes (Signed)
  Radiation Oncology         [phone] ________________________________  Name: Phillip [name] MRN: [id number]  Date: 03/19/2019  DOB: March 13, 1945   SPECIAL TREATMENT PROCEDURE   3D TREATMENT PLANNING AND DOSIMETRY: The patient's radiation plan was reviewed and approved by Dr. Vertell Limber from neurosurgery and radiation oncology prior to treatment. It showed 3-dimensional radiation distributions overlaid onto the planning CT/MRI image set. The [facility] for the target structures as well as the organs at risk were reviewed. The documentation of the 3D plan and dosimetry are filed in the radiation oncology EMR.   NARRATIVE: The patient was brought to the [employer] stereotactic radiation treatment machine and placed supine on the CT couch. The head frame was applied, and the patient was set up for stereotactic radiosurgery. Neurosurgery was present for the set-up and delivery   SIMULATION VERIFICATION: In the couch zero-angle position, the patient underwent Exactrac imaging using the Brainlab system with orthogonal KV images. These were carefully aligned and repeated to confirm treatment position for each of the isocenters. The Exactrac snap film verification was repeated at each couch angle.   SPECIAL TREATMENT PROCEDURE: The patient received stereotactic radiosurgery to the following target:  1.  PTVs 1/2/6  targets weretreated using 6 Arcs to a prescription dose of 9 Gy. ExacTrac Snap verification was performed for each couch angle.  2.  Concurrently, PTVs 3/4/5/7 targets were treated using 6 Arcs to a prescription dose of 20 Gy. ExacTrac Snap verification was performed for each couch angle.    STEREOTACTIC TREATMENT MANAGEMENT: Following delivery, the patient was transported to nursing in stable condition and monitored for possible acute effects. Vital signs were recorded . The patient tolerated treatment without significant acute effects, and was discharged to home in stable condition.  PLAN: The  patient will return to our clinic to receive the 2 additional fractions to the PTV targets 1/2/6.   ------------------------------------------------  Jodelle Gross, MD, PhD

## 2019-05-07 ENCOUNTER — Inpatient Hospital Stay: Payer: PPO

## 2019-05-07 ENCOUNTER — Other Ambulatory Visit: Payer: Self-pay

## 2019-05-07 ENCOUNTER — Inpatient Hospital Stay: Payer: PPO | Attending: Oncology | Admitting: Nurse Practitioner

## 2019-05-07 ENCOUNTER — Encounter: Payer: Self-pay | Admitting: Nurse Practitioner

## 2019-05-07 VITALS — BP 135/90 | HR 82 | Temp 98.0°F | Resp 17 | Ht 70.0 in | Wt 236.9 lb

## 2019-05-07 DIAGNOSIS — C7951 Secondary malignant neoplasm of bone: Secondary | ICD-10-CM | POA: Insufficient documentation

## 2019-05-07 DIAGNOSIS — K5900 Constipation, unspecified: Secondary | ICD-10-CM | POA: Diagnosis not present

## 2019-05-07 DIAGNOSIS — Z95828 Presence of other vascular implants and grafts: Secondary | ICD-10-CM

## 2019-05-07 DIAGNOSIS — C7949 Secondary malignant neoplasm of other parts of nervous system: Secondary | ICD-10-CM

## 2019-05-07 DIAGNOSIS — C649 Malignant neoplasm of unspecified kidney, except renal pelvis: Secondary | ICD-10-CM

## 2019-05-07 DIAGNOSIS — Z79899 Other long term (current) drug therapy: Secondary | ICD-10-CM | POA: Diagnosis not present

## 2019-05-07 DIAGNOSIS — Z9221 Personal history of antineoplastic chemotherapy: Secondary | ICD-10-CM | POA: Insufficient documentation

## 2019-05-07 DIAGNOSIS — I10 Essential (primary) hypertension: Secondary | ICD-10-CM | POA: Insufficient documentation

## 2019-05-07 DIAGNOSIS — C642 Malignant neoplasm of left kidney, except renal pelvis: Secondary | ICD-10-CM | POA: Insufficient documentation

## 2019-05-07 LAB — CMP (CANCER CENTER ONLY)
ALT: 30 U/L (ref 0–44)
AST: 24 U/L (ref 15–41)
Albumin: 2.9 g/dL — ABNORMAL LOW (ref 3.5–5.0)
Alkaline Phosphatase: 69 U/L (ref 38–126)
Anion gap: 10 (ref 5–15)
BUN: 20 mg/dL (ref 8–23)
CO2: 29 mmol/L (ref 22–32)
Calcium: 9 mg/dL (ref 8.9–10.3)
Chloride: 100 mmol/L (ref 98–111)
Creatinine: 0.76 mg/dL (ref 0.61–1.24)
GFR, Est AFR Am: >60 mL/min (ref >60)
GFR, Estimated: >60 mL/min (ref >60)
Glucose, Bld: 123 mg/dL — ABNORMAL HIGH (ref 70–99)
Potassium: 4.2 mmol/L (ref 3.5–5.1)
Sodium: 139 mmol/L (ref 135–145)
Total Bilirubin: 0.3 mg/dL (ref 0.3–1.2)
Total Protein: 6.5 g/dL (ref 6.5–8.1)

## 2019-05-07 LAB — CBC WITH DIFFERENTIAL (CANCER CENTER ONLY)
Abs Immature Granulocytes: 0.02 10*3/uL (ref 0.00–0.07)
Basophils Absolute: 0 10*3/uL (ref 0.0–0.1)
Basophils Relative: 0 %
Eosinophils Absolute: 0.2 10*3/uL (ref 0.0–0.5)
Eosinophils Relative: 2 %
HCT: 39.4 % (ref 39.0–52.0)
Hemoglobin: 12.7 g/dL — ABNORMAL LOW (ref 13.0–17.0)
Immature Granulocytes: 0 %
Lymphocytes Relative: 35 %
Lymphs Abs: 3.2 10*3/uL (ref 0.7–4.0)
MCH: 29.3 pg (ref 26.0–34.0)
MCHC: 32.2 g/dL (ref 30.0–36.0)
MCV: 90.8 fL (ref 80.0–100.0)
Monocytes Absolute: 0.6 10*3/uL (ref 0.1–1.0)
Monocytes Relative: 7 %
Neutro Abs: 5.1 10*3/uL (ref 1.7–7.7)
Neutrophils Relative %: 56 %
Platelet Count: 249 10*3/uL (ref 150–400)
RBC: 4.34 MIL/uL (ref 4.22–5.81)
RDW: 18.6 % — ABNORMAL HIGH (ref 11.5–15.5)
WBC Count: 9.1 10*3/uL (ref 4.0–10.5)
nRBC: 0 % (ref 0.0–0.2)

## 2019-05-07 MED ORDER — HEPARIN SOD (PORK) LOCK FLUSH 100 UNIT/ML IV SOLN
500.0000 [IU] | Freq: Once | INTRAVENOUS | Status: AC
  Administered 2019-05-07: 500 [IU]
  Filled 2019-05-07: qty 5

## 2019-05-07 MED ORDER — ZOLEDRONIC ACID 4 MG/100ML IV SOLN
4.0000 mg | Freq: Once | INTRAVENOUS | Status: AC
  Administered 2019-05-07: 4 mg via INTRAVENOUS

## 2019-05-07 MED ORDER — ZOLEDRONIC ACID 4 MG/100ML IV SOLN
INTRAVENOUS | Status: AC
  Filled 2019-05-07: qty 100

## 2019-05-07 MED ORDER — SODIUM CHLORIDE 0.9 % IV SOLN
INTRAVENOUS | Status: DC
  Filled 2019-05-07: qty 250

## 2019-05-07 MED ORDER — SODIUM CHLORIDE 0.9% FLUSH
10.0000 mL | Freq: Once | INTRAVENOUS | Status: AC
  Administered 2019-05-07: 10 mL
  Filled 2019-05-07: qty 10

## 2019-05-07 NOTE — Progress Notes (Addendum)
[facility] OFFICE PROGRESS NOTE   Diagnosis: Renal cell carcinoma  INTERVAL HISTORY:   [name] returns as scheduled.  He continues cabozantinib.  He has had a few episodes of nausea with no vomiting.  No diarrhea.  Some constipation.  He is fatigued.  He states that he "sleeps a lot".  Alert and oriented.  No expressive aphasia.  He reports recent cellulitis right lower leg.  He completed a 7-day course of doxycycline.  Objective:  Vital signs in last 24 hours:  Blood pressure 135/90, pulse 82, temperature 98 F (36.7 C), temperature source Temporal, resp. rate 17, height 5\' 10"  (1.778 m), weight 236 lb 14.4 oz (107.5 kg), SpO2 99 %.    HEENT: No thrush or ulcers. Resp: Lungs clear bilaterally. Cardio: Regular rate and rhythm.   GI: Abdomen soft and nontender.  No hepatomegaly. Vascular: No leg edema. Neuro: Alert and oriented. Skin: Acne type skin rash anterior chest. Port-A-Cath without erythema.  Lab Results:  Lab Results  Component Value Date   WBC 9.1 05/07/2019   HGB 12.7 (L) 05/07/2019   HCT 39.4 05/07/2019   MCV 90.8 05/07/2019   PLT 249 05/07/2019   NEUTROABS 5.1 05/07/2019    Imaging:  No results found.  Medications: I have reviewed the patient's current medications.  Assessment/Plan: 1. Metastatic renal cell carcinoma   L4 mass with extraosseous extension, L4 nerve compression  Biopsy of the L4 mass 11/18/2015 confirmed metastatic renal cell carcinoma, clear cell type  CTs of the chest, abdomen, and pelvis 11/18/2015-right lower lobe nodule, expansile lytic lesion at the right 11th rib/costal vertebral junction, left renal mass, expansile lesion involving the L4 vertebra, lytic lesion at the left acetabulum, and a low-attenuation liver lesion  Initiation of SRS to L4 12/02/2015, Completed 12/12/2015  Initiation of Pazopanib11/04/2015  Pazopanibplaced on hold 02/06/2016 secondary to elevated liver enzymes  Pazopanibresumed  03/07/2016 at a dose of 400 mg daily  Pazopanibdiscontinued 03/19/2016 secondary to elevated liver enzymes  Restaging CTs 04/02/2016-stable left renal mass, decreased soft tissue component associated with the L4 metastasis, increased soft tissue component associated with the right 11th rib metastasis with increased T11 bony destruction, increased sclerosis at the left acetabulum lesion  Cycle 1 nivolumab 04/12/2016  Cycle 2 nivolumab 04/26/2016  Cycle 3 nivolumab03/16/2018  Cycle 4 nivolumab 05/24/2016  Cycle 5 nivolumab 06/08/2016  MRI lumbar spine 06/21/2016-unchanged tumor at L3, increased size of retroperitoneal lymph nodes compared to a CT from 04/02/2016  Cycle 6 nivolumab 06/22/2016  CTs chest, abdomen, and pelvis 07/04/2016-enlargement of the left renal mass, right adrenal nodule, left hilar and peritoneal lymph nodes, enlargement of left acetabular lesion. Stable lung nodules.  Cycle 7 nivolumab05/12/2016   Cycle 8 nivolumab 07/20/2016  Cycle 9 nivolumab 08/02/2016  Cycle 10 nivolumab 08/20/2016  Restaging CT 09/03/2016 evaluation with stable disease  Cycle 11 nivolumab 09/05/2016  Cycle 12 nivolumab 09/19/2016  Cycle 13 nivolumab 10/03/2016  Cycle 14 nivolumab 10/17/2016  Cycle 15 nivolumab 10/31/2016  Cycle 16 nivolumab 11/14/2016 (changed to monthly schedule)  Cycle 17nivolumab10/24/2018  CTs 01/21/2017-increased left renal mass, increased size of adrenal metastases, increased lytic bone lesions, increased left lung nodule, persistent tumor at L4 with probable epidural component  Initiation of Cabozantinib12/04/2016  Restaging CTs 05/30/2017-decreased size of left hilar mass, left renal mass, retroperitoneal adenopathy, and adrenal metastasis. Healing bone lesions.  Cabozantinib continued  CTs 10/21/2017-interval enlargement left hilar lymph node; stable rib lesions; stable mass left renal cortex; stable mildly nodular adrenal glands; stable  lytic lesions within the pelvis and spine.  Cabozantinib continued  CTs 02/21/2018-enlargement of an AP window lymph node. Mildly enlarged left hilar lymph node is unchanged. Primary renal cell carcinoma involving the upper pole of the left kidney appears similar. Stable enlarged right periaortic lymph node adjacent to the renal vessels. Stable multifocal bony metastatic disease.  Cabozantinibcontinued  CTs 07/29/2018-stable 15 mm AP window nodes; stable left hilar node; subcarinal node slightly larger; right periaortic node 16 mm, previously 12 mm; portacaval node 24 mm, previously 16 mm; 15 mm node superior to the pancreatic head has enlarged; interval increase nodularity of both adrenal glands; stable left kidney mass; multifocal bony metastatic disease not significantly changed.  Cabozantinib continued  CTs 11/06/2018-moderate improvement in thoracic adenopathy; minimal improvement abdominal adenopathy; left kidney upper pole mass and various lytic expansile bone lesions stable; mild increase in nodularity of left adrenal gland; right adrenal gland nodularity stable.  Cabozantinib continued  Clinical evidence of partial seizure activity December 2020  CT head 02/25/2019-extensive vasogenic edema, left greater than right. Peripheral enhancing masses consistent with metastases. No hemorrhage.  MRI brain 03/11/2019-7 enhancing brain masses consistent with metastatic disease  SRS to 7 brain lesions, treatment given 03/19/2019, 03/23/2019, and 03/25/2019  CTs 04/09/2019-decrease in left renal mass, bilateral adrenal nodules, AP window and porta hepatic adenopathy.  New 9 mm right lower lobe nodule.  Improved lytic lesion of the left acetabulum.  Other bone lesions are stable.  Cabozantinib continued 2. Pain secondary to #1-managed by Dr. Lovenia [name].Improved 3. Hypertension 3. Elevated transaminases 02/06/2016-Pazopanibplaced on hold  Liver enzymes normal 03/07/2016 5. Port-A-Cath  placement 05/16/2016 6. Malaise/anorexia 09/05/2016. Cortisol and testosterone levels low. Hydrocortisone and testosterone replacement initiated. 7. Conjunctival/scleral erythema 09/19/2016-resolved with steroid eyedrops 8. 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 9. Hypercalcemia status post Zometa 01/23/2017-resolved 10. Pruritic rash following IV contrast 10/21/2017;rash following IV contrast 02/21/2018 despite prednisone/Benadryl premedication 11. Brief episodes of expressive aphasia October and December 2020   Disposition: Phillip [name] appears unchanged.  There is no clinical evidence of disease progression.  He will continue cabozantinib.  He is experiencing somnolence/fatigue.  This may be related to the brain radiation.  We messaged radiation oncology regarding his symptoms.  Resuming dexamethasone is not recommended at this time in the absence of other symptoms.  We made a referral to physical therapy.  We are checking a TSH today.  He will return for lab and follow-up in 4 weeks.  He will contact the office in the interim with any problems.  Patient seen with Dr. [name].    [name] ANP/GNP-BC   05/07/2019  3:00 PM This was a shared visit with [name].  Phillip [name] has increased somnolence and otherwise appears stable.  The somnolence may be related to recent brain radiation.  No other neurologic symptoms.  Julieanne Manson, MD

## 2019-05-07 NOTE — Progress Notes (Signed)
  Radiation Oncology         [phone] ________________________________  Name: Phillip [name] MRN: [id number]  Date: 03/25/2019  DOB: [birth year]  End of Treatment Note  Diagnosis:   Brain metastasis     Indication for treatment:  palliative       Radiation treatment dates:   03/19/19 - 03/25/19  Site/dose:    PTVs 2/4/5/7 were treated to a dose of 20 Gy in 1 fraction through a SRS technique. 2. PTVs 1/2/6 were treated to a dose of 27 Gy in 3 fractions through a SRS technique.  Narrative: The patient tolerated radiation treatment well.   There were no signs of acute toxicity after treatment.  Plan: The patient has completed radiation treatment. The patient will return to radiation oncology clinic for routine followup in one month. I advised the patient to call or return sooner if they have any questions or concerns related to their recovery or treatment. ________________________________  ------------------------------------------------  Jodelle Gross, MD, PhD

## 2019-05-08 ENCOUNTER — Telehealth: Payer: Self-pay | Admitting: Oncology

## 2019-05-08 ENCOUNTER — Other Ambulatory Visit: Payer: Self-pay | Admitting: Radiation Therapy

## 2019-05-08 DIAGNOSIS — C7931 Secondary malignant neoplasm of brain: Secondary | ICD-10-CM

## 2019-05-08 LAB — TSH: TSH: 0.29 u[IU]/mL — ABNORMAL LOW (ref 0.320–4.118)

## 2019-05-08 NOTE — Telephone Encounter (Signed)
Scheduled per los. Called and spoke with patient. Confirmed appt 

## 2019-05-13 DIAGNOSIS — C7951 Secondary malignant neoplasm of bone: Secondary | ICD-10-CM | POA: Diagnosis not present

## 2019-05-19 DIAGNOSIS — R2689 Other abnormalities of gait and mobility: Secondary | ICD-10-CM | POA: Diagnosis not present

## 2019-05-19 DIAGNOSIS — R531 Weakness: Secondary | ICD-10-CM | POA: Diagnosis not present

## 2019-05-19 DIAGNOSIS — M6281 Muscle weakness (generalized): Secondary | ICD-10-CM | POA: Diagnosis not present

## 2019-05-21 DIAGNOSIS — R2689 Other abnormalities of gait and mobility: Secondary | ICD-10-CM | POA: Diagnosis not present

## 2019-05-21 DIAGNOSIS — M6281 Muscle weakness (generalized): Secondary | ICD-10-CM | POA: Diagnosis not present

## 2019-05-21 DIAGNOSIS — R531 Weakness: Secondary | ICD-10-CM | POA: Diagnosis not present

## 2019-05-26 ENCOUNTER — Other Ambulatory Visit: Payer: Self-pay | Admitting: Oncology

## 2019-05-26 ENCOUNTER — Other Ambulatory Visit: Payer: Self-pay | Admitting: *Deleted

## 2019-05-26 ENCOUNTER — Ambulatory Visit: Payer: PPO | Attending: Internal Medicine

## 2019-05-26 DIAGNOSIS — M6281 Muscle weakness (generalized): Secondary | ICD-10-CM | POA: Diagnosis not present

## 2019-05-26 DIAGNOSIS — R2689 Other abnormalities of gait and mobility: Secondary | ICD-10-CM | POA: Diagnosis not present

## 2019-05-26 DIAGNOSIS — C649 Malignant neoplasm of unspecified kidney, except renal pelvis: Secondary | ICD-10-CM

## 2019-05-26 DIAGNOSIS — R531 Weakness: Secondary | ICD-10-CM | POA: Diagnosis not present

## 2019-05-26 DIAGNOSIS — Z23 Encounter for immunization: Secondary | ICD-10-CM

## 2019-05-26 MED ORDER — MIRTAZAPINE 30 MG PO TABS: 30.0000 mg | ORAL_TABLET | Freq: Every day | ORAL | 1 refills | Status: DC

## 2019-05-26 MED FILL — LOSARTAN-HCTZ 100-12.5 MG T: 100-12.5 | 30 days supply | Qty: 30 | Fill #3

## 2019-05-26 MED FILL — HYDROCORTISONE 10 MG TABLET: 10 | 30 days supply | Qty: 90 | Fill #7

## 2019-05-26 MED FILL — TESTOSTERONE 20.25 MG/ACT (: 20.25 MG/AC | 30 days supply | Qty: 75 | Fill #3

## 2019-05-26 MED FILL — levETIRAcetam 500 MG TABS: 500 | 30 days supply | Qty: 60 | Fill #1

## 2019-05-26 NOTE — Progress Notes (Signed)
   Covid-19 Vaccination Clinic  Name:  Phillip [name]    MRN: [id number] DOB: 09-Feb-1946  05/26/2019  [name] was observed post Covid-19 immunization for 15 minutes without incident. He was provided with Vaccine Information Sheet and instruction to access the V-Safe system.   [name] was instructed to call 911 with any severe reactions post vaccine: [name] Difficulty breathing  . Swelling of face and throat  . A fast heartbeat  . A bad rash all over body  . Dizziness and weakness   Immunizations Administered    Name Date Dose VIS Date Route   Pfizer COVID-19 Vaccine 05/26/2019 12:13 PM 0.3 mL 02/06/2019 Intramuscular   Manufacturer: [employer], [employer]   Lot: [id number]   [place]: [id number]

## 2019-05-27 MED FILL — CABOMETYX 40 MG TABLET: 40 | 30 days supply | Qty: 30 | Fill #0

## 2019-05-28 DIAGNOSIS — M6281 Muscle weakness (generalized): Secondary | ICD-10-CM | POA: Diagnosis not present

## 2019-05-28 DIAGNOSIS — R531 Weakness: Secondary | ICD-10-CM | POA: Diagnosis not present

## 2019-05-28 DIAGNOSIS — R2689 Other abnormalities of gait and mobility: Secondary | ICD-10-CM | POA: Diagnosis not present

## 2019-05-28 MED FILL — ONDANSETRON HCL 8 MG TABLET: 8 | 30 days supply | Qty: 90 | Fill #1

## 2019-05-28 MED FILL — OxyCONTIN 10 MG T12A: 10 | 30 days supply | Qty: 90 | Fill #0

## 2019-06-02 DIAGNOSIS — R531 Weakness: Secondary | ICD-10-CM | POA: Diagnosis not present

## 2019-06-02 DIAGNOSIS — R2689 Other abnormalities of gait and mobility: Secondary | ICD-10-CM | POA: Diagnosis not present

## 2019-06-02 DIAGNOSIS — M6281 Muscle weakness (generalized): Secondary | ICD-10-CM | POA: Diagnosis not present

## 2019-06-04 ENCOUNTER — Inpatient Hospital Stay: Payer: PPO | Attending: Oncology | Admitting: Oncology

## 2019-06-04 ENCOUNTER — Other Ambulatory Visit: Payer: Self-pay

## 2019-06-04 ENCOUNTER — Inpatient Hospital Stay: Payer: PPO

## 2019-06-04 DIAGNOSIS — G893 Neoplasm related pain (acute) (chronic): Secondary | ICD-10-CM | POA: Diagnosis not present

## 2019-06-04 DIAGNOSIS — C642 Malignant neoplasm of left kidney, except renal pelvis: Secondary | ICD-10-CM | POA: Insufficient documentation

## 2019-06-04 DIAGNOSIS — R400 Somnolence: Secondary | ICD-10-CM | POA: Diagnosis not present

## 2019-06-04 DIAGNOSIS — C649 Malignant neoplasm of unspecified kidney, except renal pelvis: Secondary | ICD-10-CM

## 2019-06-04 DIAGNOSIS — Z452 Encounter for adjustment and management of vascular access device: Secondary | ICD-10-CM | POA: Diagnosis not present

## 2019-06-04 DIAGNOSIS — C7951 Secondary malignant neoplasm of bone: Secondary | ICD-10-CM | POA: Diagnosis not present

## 2019-06-04 DIAGNOSIS — I10 Essential (primary) hypertension: Secondary | ICD-10-CM | POA: Insufficient documentation

## 2019-06-04 DIAGNOSIS — R531 Weakness: Secondary | ICD-10-CM | POA: Diagnosis not present

## 2019-06-04 DIAGNOSIS — R2689 Other abnormalities of gait and mobility: Secondary | ICD-10-CM | POA: Diagnosis not present

## 2019-06-04 DIAGNOSIS — M6281 Muscle weakness (generalized): Secondary | ICD-10-CM | POA: Diagnosis not present

## 2019-06-04 LAB — CMP (CANCER CENTER ONLY)
ALT: 18 U/L (ref 0–44)
AST: 13 U/L — ABNORMAL LOW (ref 15–41)
Albumin: 3.3 g/dL — ABNORMAL LOW (ref 3.5–5.0)
Alkaline Phosphatase: 52 U/L (ref 38–126)
Anion gap: 7 (ref 5–15)
BUN: 15 mg/dL (ref 8–23)
CO2: 28 mmol/L (ref 22–32)
Calcium: 8.9 mg/dL (ref 8.9–10.3)
Chloride: 104 mmol/L (ref 98–111)
Creatinine: 0.87 mg/dL (ref 0.61–1.24)
GFR, Est AFR Am: >60 mL/min (ref >60)
GFR, Estimated: >60 mL/min (ref >60)
Glucose, Bld: 151 mg/dL — ABNORMAL HIGH (ref 70–99)
Potassium: 4.1 mmol/L (ref 3.5–5.1)
Sodium: 139 mmol/L (ref 135–145)
Total Bilirubin: 0.3 mg/dL (ref 0.3–1.2)
Total Protein: 6.2 g/dL — ABNORMAL LOW (ref 6.5–8.1)

## 2019-06-04 LAB — CBC WITH DIFFERENTIAL (CANCER CENTER ONLY)
Abs Immature Granulocytes: 0.01 K/uL (ref 0.00–0.07)
Basophils Absolute: 0.1 K/uL (ref 0.0–0.1)
Basophils Relative: 1 %
Eosinophils Absolute: 0.6 K/uL — ABNORMAL HIGH (ref 0.0–0.5)
Eosinophils Relative: 7 %
HCT: 38.6 % — ABNORMAL LOW (ref 39.0–52.0)
Hemoglobin: 12.1 g/dL — ABNORMAL LOW (ref 13.0–17.0)
Immature Granulocytes: 0 %
Lymphocytes Relative: 34 %
Lymphs Abs: 2.9 K/uL (ref 0.7–4.0)
MCH: 29.5 pg (ref 26.0–34.0)
MCHC: 31.3 g/dL (ref 30.0–36.0)
MCV: 94.1 fL (ref 80.0–100.0)
Monocytes Absolute: 0.6 K/uL (ref 0.1–1.0)
Monocytes Relative: 7 %
Neutro Abs: 4.5 K/uL (ref 1.7–7.7)
Neutrophils Relative %: 51 %
Platelet Count: 227 K/uL (ref 150–400)
RBC: 4.1 MIL/uL — ABNORMAL LOW (ref 4.22–5.81)
RDW: 17.9 % — ABNORMAL HIGH (ref 11.5–15.5)
WBC Count: 8.7 K/uL (ref 4.0–10.5)
nRBC: 0 % (ref 0.0–0.2)

## 2019-06-04 MED ORDER — HEPARIN SOD (PORK) LOCK FLUSH 100 UNIT/ML IV SOLN
500.0000 [IU] | Freq: Once | INTRAVENOUS | Status: AC
  Administered 2019-06-04: 500 [IU] via INTRAVENOUS
  Filled 2019-06-04: qty 5

## 2019-06-04 MED ORDER — SODIUM CHLORIDE 0.9% FLUSH
10.0000 mL | INTRAVENOUS | Status: DC | PRN
  Administered 2019-06-04: 10 mL via INTRAVENOUS
  Filled 2019-06-04: qty 10

## 2019-06-04 NOTE — Progress Notes (Signed)
[facility] OFFICE PROGRESS NOTE   Diagnosis: Renal cell carcinoma  INTERVAL HISTORY:   Phillip [name] returns as scheduled.  He continues cabozantinib.  No rash or diarrhea.  His appetite is poor.  Stable pain in the back and right leg.  He takes OxyContin twice daily.  He has increased somnolence.  He is no longer taking Decadron.  No seizures or other neurologic symptoms.  Objective:  Vital signs in last 24 hours:  There were no vitals taken for this visit.     Resp: Lungs clear bilaterally Cardio: Regular rate and rhythm GI: No hepatosplenomegaly Vascular: Trace lower leg edema bilaterally Neuro: Alert and oriented, he ambulated to the exam table without difficulty    Portacath/PICC-without erythema  Lab Results:  Lab Results  Component Value Date   WBC 8.7 06/04/2019   HGB 12.1 (L) 06/04/2019   HCT 38.6 (L) 06/04/2019   MCV 94.1 06/04/2019   PLT 227 06/04/2019   NEUTROABS 4.5 06/04/2019    CMP  Lab Results  Component Value Date   NA 139 05/07/2019   K 4.2 05/07/2019   CL 100 05/07/2019   CO2 29 05/07/2019   GLUCOSE 123 (H) 05/07/2019   BUN 20 05/07/2019   CREATININE 0.76 05/07/2019   CALCIUM 9.0 05/07/2019   PROT 6.5 05/07/2019   ALBUMIN 2.9 (L) 05/07/2019   AST 24 05/07/2019   ALT 30 05/07/2019   ALKPHOS 69 05/07/2019   BILITOT 0.3 05/07/2019   GFRNONAA >60 05/07/2019   GFRAA >60 05/07/2019    Lab Results  Component Value Date   CEA1 1.77 01/04/2016    Medications: I have reviewed the patient's current medications.   Assessment/Plan: 1. Metastatic renal cell carcinoma   L4 mass with extraosseous extension, L4 nerve compression  Biopsy of the L4 mass 11/18/2015 confirmed metastatic renal cell carcinoma, clear cell type  CTs of the chest, abdomen, and pelvis 11/18/2015-right lower lobe nodule, expansile lytic lesion at the right 11th rib/costal vertebral junction, left renal mass, expansile lesion involving the L4 vertebra,  lytic lesion at the left acetabulum, and a low-attenuation liver lesion  Initiation of SRS to L4 12/02/2015, Completed 12/12/2015  Initiation of Pazopanib11/04/2015  Pazopanibplaced on hold 02/06/2016 secondary to elevated liver enzymes  Pazopanibresumed 03/07/2016 at a dose of 400 mg daily  Pazopanibdiscontinued 03/19/2016 secondary to elevated liver enzymes  Restaging CTs 04/02/2016-stable left renal mass, decreased soft tissue component associated with the L4 metastasis, increased soft tissue component associated with the right 11th rib metastasis with increased T11 bony destruction, increased sclerosis at the left acetabulum lesion  Cycle 1 nivolumab 04/12/2016  Cycle 2 nivolumab 04/26/2016  Cycle 3 nivolumab03/16/2018  Cycle 4 nivolumab 05/24/2016  Cycle 5 nivolumab 06/08/2016  MRI lumbar spine 06/21/2016-unchanged tumor at L3, increased size of retroperitoneal lymph nodes compared to a CT from 04/02/2016  Cycle 6 nivolumab 06/22/2016  CTs chest, abdomen, and pelvis 07/04/2016-enlargement of the left renal mass, right adrenal nodule, left hilar and peritoneal lymph nodes, enlargement of left acetabular lesion. Stable lung nodules.  Cycle 7 nivolumab05/12/2016   Cycle 8 nivolumab 07/20/2016  Cycle 9 nivolumab 08/02/2016  Cycle 10 nivolumab 08/20/2016  Restaging CT 09/03/2016 evaluation with stable disease  Cycle 11 nivolumab 09/05/2016  Cycle 12 nivolumab 09/19/2016  Cycle 13 nivolumab 10/03/2016  Cycle 14 nivolumab 10/17/2016  Cycle 15 nivolumab 10/31/2016  Cycle 16 nivolumab 11/14/2016 (changed to monthly schedule)  Cycle 17nivolumab10/24/2018  CTs 01/21/2017-increased left renal mass, increased size of adrenal metastases, increased lytic  bone lesions, increased left lung nodule, persistent tumor at L4 with probable epidural component  Initiation of Cabozantinib12/04/2016  Restaging CTs 05/30/2017-decreased size of left hilar mass, left renal  mass, retroperitoneal adenopathy, and adrenal metastasis. Healing bone lesions.  Cabozantinib continued  CTs 10/21/2017-interval enlargement left hilar lymph node; stable rib lesions; stable mass left renal cortex; stable mildly nodular adrenal glands; stable lytic lesions within the pelvis and spine.  Cabozantinib continued  CTs 02/21/2018-enlargement of an AP window lymph node. Mildly enlarged left hilar lymph node is unchanged. Primary renal cell carcinoma involving the upper pole of the left kidney appears similar. Stable enlarged right periaortic lymph node adjacent to the renal vessels. Stable multifocal bony metastatic disease.  Cabozantinibcontinued  CTs 07/29/2018-stable 15 mm AP window nodes; stable left hilar node; subcarinal node slightly larger; right periaortic node 16 mm, previously 12 mm; portacaval node 24 mm, previously 16 mm; 15 mm node superior to the pancreatic head has enlarged; interval increase nodularity of both adrenal glands; stable left kidney mass; multifocal bony metastatic disease not significantly changed.  Cabozantinib continued  CTs 11/06/2018-moderate improvement in thoracic adenopathy; minimal improvement abdominal adenopathy; left kidney upper pole mass and various lytic expansile bone lesions stable; mild increase in nodularity of left adrenal gland; right adrenal gland nodularity stable.  Cabozantinib continued  Clinical evidence of partial seizure activity December 2020  CT head 02/25/2019-extensive vasogenic edema, left greater than right. Peripheral enhancing masses consistent with metastases. No hemorrhage.  MRI brain 03/11/2019-7 enhancing brain masses consistent with metastatic disease  SRS to 7 brain lesions, treatment given 03/19/2019, 03/23/2019, and 03/25/2019  CTs 04/09/2019-decrease in left renal mass, bilateral adrenal nodules, AP window and porta hepatic adenopathy.  New 9 mm right lower lobe nodule.  Improved lytic lesion of the left  acetabulum.  Other bone lesions are stable.  Cabozantinib continued 2. Pain secondary to #1-managed by Dr. Lovenia [name].Improved 3. Hypertension 3. Elevated transaminases 02/06/2016-Pazopanibplaced on hold  Liver enzymes normal 03/07/2016 5. Port-A-Cath placement 05/16/2016 6. Malaise/anorexia 09/05/2016. Cortisol and testosterone levels low. Hydrocortisone and testosterone replacement initiated. 7. Conjunctival/scleral erythema 09/19/2016-resolved with steroid eyedrops 8. 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 9. Hypercalcemia status post Zometa 01/23/2017-resolved 10. Pruritic rash following IV contrast 10/21/2017;rash following IV contrast 02/21/2018 despite prednisone/Benadryl premedication 11. Brief episodes of expressive aphasia October and December 2020  Disposition: Mr. Doup appears stable.  No clinical evidence for progression of the metastatic renal cell carcinoma.  He is scheduled for a restaging brain MRI in 2 weeks.  The etiology of the somnolence is unclear.  This may be related to brain radiation or polypharmacy.  We did not change the medical regimen today.  He is scheduled for a vacation beginning 06/27/2019.  He will return for an office visit during the week of 07/13/2019.  We will plan for restaging CTs after the next office visit.  Betsy Coder, MD  06/04/2019  4:17 PM

## 2019-06-05 ENCOUNTER — Telehealth: Payer: Self-pay | Admitting: Oncology

## 2019-06-05 LAB — TSH: TSH: 0.259 u[IU]/mL — ABNORMAL LOW (ref 0.320–4.118)

## 2019-06-05 NOTE — Telephone Encounter (Signed)
Scheduled per los. Called and left msg. Mailed printout  °

## 2019-06-09 DIAGNOSIS — R2689 Other abnormalities of gait and mobility: Secondary | ICD-10-CM | POA: Diagnosis not present

## 2019-06-09 DIAGNOSIS — M6281 Muscle weakness (generalized): Secondary | ICD-10-CM | POA: Diagnosis not present

## 2019-06-09 DIAGNOSIS — R531 Weakness: Secondary | ICD-10-CM | POA: Diagnosis not present

## 2019-06-11 DIAGNOSIS — M6281 Muscle weakness (generalized): Secondary | ICD-10-CM | POA: Diagnosis not present

## 2019-06-11 DIAGNOSIS — R531 Weakness: Secondary | ICD-10-CM | POA: Diagnosis not present

## 2019-06-11 DIAGNOSIS — R2689 Other abnormalities of gait and mobility: Secondary | ICD-10-CM | POA: Diagnosis not present

## 2019-06-13 ENCOUNTER — Other Ambulatory Visit (HOSPITAL_COMMUNITY)
Admission: RE | Admit: 2019-06-13 | Discharge: 2019-06-13 | Disposition: A | Discharge status: Home / Self Care | Payer: PPO | Source: Ambulatory Visit | Attending: Radiation Oncology | Admitting: Radiation Oncology

## 2019-06-13 DIAGNOSIS — Z20822 Contact with and (suspected) exposure to covid-19: Secondary | ICD-10-CM | POA: Diagnosis not present

## 2019-06-13 DIAGNOSIS — Z01812 Encounter for preprocedural laboratory examination: Secondary | ICD-10-CM | POA: Diagnosis not present

## 2019-06-13 LAB — SARS CORONAVIRUS 2 (TAT 6-24 HRS): SARS Coronavirus 2: NEGATIVE

## 2019-06-15 ENCOUNTER — Encounter (HOSPITAL_COMMUNITY): Payer: Self-pay | Admitting: *Deleted

## 2019-06-15 ENCOUNTER — Other Ambulatory Visit: Payer: Self-pay

## 2019-06-15 NOTE — Anesthesia Preprocedure Evaluation (Addendum)
Anesthesia Evaluation  Patient identified by MRN, date of birth, ID band Patient awake    Reviewed: Allergy & Precautions, NPO status , Patient's Chart, lab work & pertinent test results  Airway Mallampati: II  TM Distance: >3 FB     Dental   Pulmonary    breath sounds clear to auscultation       Cardiovascular hypertension,  Rhythm:Regular Rate:Normal     Neuro/Psych Seizures -,  PSYCHIATRIC DISORDERS Anxiety Depression    GI/Hepatic negative GI ROS, Neg liver ROS,   Endo/Other    Renal/GU Renal disease     Musculoskeletal   Abdominal   Peds  Hematology   Anesthesia Other Findings   Reproductive/Obstetrics                             Anesthesia Physical Anesthesia Plan  ASA: III  Anesthesia Plan: General   Post-op Pain Management:    Induction: Intravenous  PONV Risk Score and Plan: Ondansetron, Dexamethasone and Midazolam  Airway Management Planned: Oral ETT  Additional Equipment:   Intra-op Plan:   Post-operative Plan:   Informed Consent: I have reviewed the patients History and Physical, chart, labs and discussed the procedure including the risks, benefits and alternatives for the proposed anesthesia with the patient or authorized representative who has indicated his/her understanding and acceptance.     Dental advisory given  Plan Discussed with: CRNA and Anesthesiologist  Anesthesia Plan Comments: (PAT note written 06/15/2019 by Myra Gianotti, PA-C. H&P by Dr. [name]. )       Anesthesia Quick Evaluation

## 2019-06-15 NOTE — Progress Notes (Signed)
Anesthesia Chart Review:  Case: [id number] Date/Time: 06/16/19 0945   Procedure: MRI BRAIN WITH AND WITHOUT CONTRAST WITH ANESTHESIA (N/A )   Anesthesia type: General   Pre-op diagnosis: METASTASIS TO THE BRAIN   Location: [facility] OR RADIOLOGY ROOM / [facility]   Surgeons: Radiologist, Medication, MD      DISCUSSION: Patient is a 74 year old male with metastatic renal cell carcinoma scheduled for MRI of the brain under general anesthesia. Seen by Betsy Coder, MD on 06/04/19. He noted patient having increased somnolence, possibly due to brain radiation or polypharmacy. No clinical evidence of progression of metastatic renal cell carcinoma at that time, but needed restaging brain MRI following SRS treatments.   History includes never smoker, HTN, renal cancer (diagnosed 11/18/15 following L4 mass biopsy consistent with metastatic renal cell carcinoma; left renal mass, RLL nodule, low attenuation liver lesion, lytic lesion right 11th rib, L, left acetabulum on imaging, brain mets ~ 01/2019, had possible seizure/expressive aphasia, s/p SRS), Port-a-cath (2018). BMI is consistent with obesity.   Preprocedure COVID-19 test negative on 06/13/2019.  Anesthesia team to evaluate on day of procedure.   VS: Ht '5\' 10"'$  (1.778 m)   Wt 108.9 kg   BMI 34.44 kg/m  BP Readings from Last 3 Encounters:  05/07/19 135/90  04/10/19 (!) 142/69  03/25/19 (!) 144/63    PROVIDERS: [name], MD is PCP  Betsy Coder, MD is HEM-ONC Tyler Pita, MD is RAD-ONC [name], MD is neurosurgeon   LABS: Labs as of 06/04/19 include: Lab Results  Component Value Date   WBC 8.7 06/04/2019   HGB 12.1 (L) 06/04/2019   HCT 38.6 (L) 06/04/2019   PLT 227 06/04/2019   GLUCOSE 151 (H) 06/04/2019   ALT 18 06/04/2019   AST 13 (L) 06/04/2019   NA 139 06/04/2019   K 4.1 06/04/2019   CL 104 06/04/2019   CREATININE 0.87 06/04/2019   BUN 15 06/04/2019   CO2 28 06/04/2019   TSH 0.259 (L) 06/04/2019     IMAGES: CT  chest/abd/pelvis 04/09/19: IMPRESSION: 1. Interval decrease in size of upper pole left renal mass. 2. Interval decrease in size of AP window and porta hepatic adenopathy. 3. Interval decrease in size of bilateral adrenal nodules. 4. Interval development of a 9 mm nodule within the right lower lobe. Recommend attention on follow-up. 5. Improved lytic lesion involving the left acetabulum. Additional metastatic osseous lesions are grossly similar when compared to prior exam. Aortic Atherosclerosis (ICD10-I70.0).   MRI Brain 03/11/19: IMPRESSION: Seven brain metastases as listed above, with up to moderate vasogenic edema   EKG: Scheduled for day of procedure, 06/16/19.    CV: N/A  Past Medical History:  Diagnosis Date  . Anxiety   . Brain cancer ([place])   . Chronic kidney disease    renal cancer  . Constipation   . Depression   . Hypertension   . Low testosterone in male   . 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 Seizures ([place])    last seizure 01/2019 - controlled since on keppra  . Wears glasses     Past Surgical History:  Procedure Laterality Date  . insertion port-a-cath  2018  . IR GENERIC HISTORICAL  11/18/2015   IR FLUORO GUIDED NEEDLE PLC ASPIRATION/INJECTION LOC 11/18/2015 [name], MD [facility]-INTERV RAD  . IR GENERIC HISTORICAL  05/16/2016   IR FLUORO GUIDE PORT INSERTION RIGHT 05/16/2016 [name], MD [facility]-INTERV RAD  . IR GENERIC HISTORICAL  05/16/2016  IR US GUIDE VASC ACCESS RIGHT 05/16/2016 [name], MD [facility]-INTERV RAD    MEDICATIONS: No current facility-administered medications for this encounter.   [name] acetaminophen (TYLENOL) 650 MG CR tablet  . CABOMETYX 40 MG tablet  . docusate sodium (COLACE) 100 MG capsule  . hydrocortisone (CORTEF) 10 MG tablet  . ibuprofen (ADVIL,MOTRIN) 200 MG tablet  . levETIRAcetam (KEPPRA) 500 MG tablet  . lidocaine-prilocaine (EMLA) cream  . LORazepam (ATIVAN) 0.5 MG tablet  .  losartan-hydrochlorothiazide (HYZAAR) 100-12.5 MG tablet  . mirtazapine (REMERON) 30 MG tablet  . ondansetron (ZOFRAN) 8 MG tablet  . oxyCODONE (OXYCONTIN) 10 mg 12 hr tablet  . OXYCONTIN 10 MG 12 hr tablet  . polyethylene glycol (MIRALAX / GLYCOLAX) 17 g packet  . Testosterone 20.25 MG/ACT (1.62%) GEL  . diphenhydrAMINE (BENADRYL) 50 MG tablet  . LORazepam (ATIVAN) 1 MG tablet  . predniSONE (DELTASONE) 50 MG tablet   . sodium chloride flush (NS) 0.9 % injection 10 mL     Myra Gianotti, PA-C Surgical Short Stay/Anesthesiology [facility] Phone [phone] [facility] Phone [phone] 06/15/2019 3:09 PM

## 2019-06-15 NOTE — Progress Notes (Signed)
Patient denies shortness of breath, fever, cough and chest pain.  Internal Med - Dr [name] Cardiologist - n/a Oncology - Dr [name]  Chest x-ray - n/a (CT Chest 04/09/19) EKG - DOS 06/16/19 Stress Test - n/a ECHO - n/a Cardiac Cath - n/a  Anesthesia review: Yes  STOP now taking any Aspirin (unless otherwise instructed by your surgeon), Aleve, Naproxen, Ibuprofen, Motrin, Advil, Goody's, BC's, all herbal medications, fish oil, and all vitamins.   Coronavirus Screening Covid test on 06/13/19 was negative.  Patient verbalized understanding of instructions that were given via phone.\  Reviewed patient's chart for a current H&P.  Dr Gearldine Shown office note dated 06/04/19 is okay to use for H&P per Pennie Banter and Rolla Flatten.

## 2019-06-16 ENCOUNTER — Ambulatory Visit (HOSPITAL_COMMUNITY): Payer: PPO | Admitting: Vascular Surgery

## 2019-06-16 ENCOUNTER — Ambulatory Visit (HOSPITAL_COMMUNITY): Admission: RE | Admit: 2019-06-16 | Payer: PPO | Source: Ambulatory Visit

## 2019-06-16 ENCOUNTER — Ambulatory Visit (HOSPITAL_COMMUNITY)
Admission: AD | Admit: 2019-06-16 | Discharge: 2019-06-16 | Disposition: A | Discharge status: Home / Self Care | Payer: PPO | Source: Ambulatory Visit | Attending: Anesthesiology | Admitting: Anesthesiology

## 2019-06-16 ENCOUNTER — Encounter (HOSPITAL_COMMUNITY): Payer: Self-pay | Admitting: Certified Registered Nurse Anesthetist

## 2019-06-16 ENCOUNTER — Encounter (HOSPITAL_COMMUNITY): Admission: AD | Disposition: A | Discharge status: Home / Self Care | Payer: Self-pay | Source: Ambulatory Visit

## 2019-06-16 DIAGNOSIS — C7951 Secondary malignant neoplasm of bone: Secondary | ICD-10-CM | POA: Diagnosis not present

## 2019-06-16 DIAGNOSIS — Z7952 Long term (current) use of systemic steroids: Secondary | ICD-10-CM | POA: Insufficient documentation

## 2019-06-16 DIAGNOSIS — F419 Anxiety disorder, unspecified: Secondary | ICD-10-CM | POA: Insufficient documentation

## 2019-06-16 DIAGNOSIS — F329 Major depressive disorder, single episode, unspecified: Secondary | ICD-10-CM | POA: Insufficient documentation

## 2019-06-16 DIAGNOSIS — Z85528 Personal history of other malignant neoplasm of kidney: Secondary | ICD-10-CM | POA: Diagnosis not present

## 2019-06-16 DIAGNOSIS — Z539 Procedure and treatment not carried out, unspecified reason: Secondary | ICD-10-CM | POA: Diagnosis not present

## 2019-06-16 DIAGNOSIS — I129 Hypertensive chronic kidney disease with stage 1 through stage 4 chronic kidney disease, or unspecified chronic kidney disease: Secondary | ICD-10-CM | POA: Diagnosis not present

## 2019-06-16 DIAGNOSIS — C7931 Secondary malignant neoplasm of brain: Secondary | ICD-10-CM | POA: Insufficient documentation

## 2019-06-16 DIAGNOSIS — Z6834 Body mass index (BMI) 34.0-34.9, adult: Secondary | ICD-10-CM | POA: Insufficient documentation

## 2019-06-16 DIAGNOSIS — Z79899 Other long term (current) drug therapy: Secondary | ICD-10-CM | POA: Insufficient documentation

## 2019-06-16 DIAGNOSIS — R569 Unspecified convulsions: Secondary | ICD-10-CM | POA: Insufficient documentation

## 2019-06-16 DIAGNOSIS — R400 Somnolence: Secondary | ICD-10-CM | POA: Diagnosis not present

## 2019-06-16 DIAGNOSIS — E669 Obesity, unspecified: Secondary | ICD-10-CM | POA: Diagnosis not present

## 2019-06-16 DIAGNOSIS — N189 Chronic kidney disease, unspecified: Secondary | ICD-10-CM | POA: Diagnosis not present

## 2019-06-16 HISTORY — DX: Presence of spectacles and contact lenses: Z97.3

## 2019-06-16 HISTORY — DX: Chronic kidney disease, unspecified: N18.9

## 2019-06-16 HISTORY — DX: Depression, unspecified: F32.A

## 2019-06-16 HISTORY — DX: Unspecified convulsions: R56.9

## 2019-06-16 HISTORY — DX: Malignant neoplasm of brain, unspecified: C71.9

## 2019-06-16 HISTORY — PX: RADIOLOGY WITH ANESTHESIA: SHX6223

## 2019-06-16 HISTORY — DX: Anxiety disorder, unspecified: F41.9

## 2019-06-16 HISTORY — DX: Constipation, unspecified: K59.00

## 2019-06-16 HISTORY — DX: Other specified abnormal findings of blood chemistry: R79.89

## 2019-06-16 SURGERY — MRI WITH ANESTHESIA
Anesthesia: General

## 2019-06-16 MED ORDER — LACTATED RINGERS IV SOLN: INTRAVENOUS | Status: DC

## 2019-06-16 NOTE — Progress Notes (Addendum)
Patient's procedure cancelled.  IV removed and site is clean, dry, and intact.   Patient escorted to main entrance via wheelchair by nurse tech

## 2019-06-17 ENCOUNTER — Other Ambulatory Visit: Payer: Self-pay | Admitting: Radiation Therapy

## 2019-06-17 ENCOUNTER — Encounter: Payer: Self-pay | Admitting: *Deleted

## 2019-06-17 DIAGNOSIS — C7949 Secondary malignant neoplasm of other parts of nervous system: Secondary | ICD-10-CM

## 2019-06-17 DIAGNOSIS — C7931 Secondary malignant neoplasm of brain: Secondary | ICD-10-CM

## 2019-06-18 ENCOUNTER — Other Ambulatory Visit: Payer: Self-pay | Admitting: *Deleted

## 2019-06-18 DIAGNOSIS — C649 Malignant neoplasm of unspecified kidney, except renal pelvis: Secondary | ICD-10-CM

## 2019-06-18 DIAGNOSIS — R531 Weakness: Secondary | ICD-10-CM | POA: Diagnosis not present

## 2019-06-18 DIAGNOSIS — R2689 Other abnormalities of gait and mobility: Secondary | ICD-10-CM | POA: Diagnosis not present

## 2019-06-18 DIAGNOSIS — M6281 Muscle weakness (generalized): Secondary | ICD-10-CM | POA: Diagnosis not present

## 2019-06-18 MED ORDER — MIRTAZAPINE 30 MG PO TABS: 30.0000 mg | ORAL_TABLET | Freq: Every day | ORAL | 1 refills | Status: DC

## 2019-06-18 NOTE — Progress Notes (Signed)
[facility] requesting refill on mirtazepine 30 mg. They do not have a record of the refill called in March. Refill approved.

## 2019-06-22 ENCOUNTER — Other Ambulatory Visit: Payer: Self-pay | Admitting: Oncology

## 2019-06-22 DIAGNOSIS — M6281 Muscle weakness (generalized): Secondary | ICD-10-CM | POA: Diagnosis not present

## 2019-06-22 DIAGNOSIS — R2689 Other abnormalities of gait and mobility: Secondary | ICD-10-CM | POA: Diagnosis not present

## 2019-06-22 DIAGNOSIS — R531 Weakness: Secondary | ICD-10-CM | POA: Diagnosis not present

## 2019-06-22 MED FILL — ONDANSETRON HCL 8 MG TABLET: 8 | 30 days supply | Qty: 90 | Fill #2

## 2019-06-22 MED FILL — HYDROCORTISONE 10 MG TABLET: 10 | 30 days supply | Qty: 90 | Fill #8

## 2019-06-22 MED FILL — OxyCONTIN 10 MG T12A: 10 | 30 days supply | Qty: 90 | Fill #0

## 2019-06-22 MED FILL — levETIRAcetam 500 MG TABS: 500 | 30 days supply | Qty: 60 | Fill #0

## 2019-06-22 MED FILL — LOSARTAN-HCTZ 100-12.5 MG T: 100-12.5 | 30 days supply | Qty: 30 | Fill #4

## 2019-06-24 ENCOUNTER — Ambulatory Visit: Payer: PPO | Admitting: Radiation Oncology

## 2019-06-24 ENCOUNTER — Inpatient Hospital Stay: Payer: PPO

## 2019-06-24 DIAGNOSIS — R2689 Other abnormalities of gait and mobility: Secondary | ICD-10-CM | POA: Diagnosis not present

## 2019-06-24 DIAGNOSIS — M6281 Muscle weakness (generalized): Secondary | ICD-10-CM | POA: Diagnosis not present

## 2019-06-24 DIAGNOSIS — R531 Weakness: Secondary | ICD-10-CM | POA: Diagnosis not present

## 2019-06-24 MED FILL — CABOMETYX 40 MG TABLET: 40 | 30 days supply | Qty: 30 | Fill #1

## 2019-07-13 ENCOUNTER — Inpatient Hospital Stay: Payer: PPO

## 2019-07-13 ENCOUNTER — Inpatient Hospital Stay: Payer: PPO | Attending: Oncology | Admitting: Nurse Practitioner

## 2019-07-13 ENCOUNTER — Other Ambulatory Visit: Payer: Self-pay

## 2019-07-13 ENCOUNTER — Encounter: Payer: Self-pay | Admitting: Nurse Practitioner

## 2019-07-13 VITALS — BP 122/48 | HR 52 | Temp 97.8°F | Resp 17 | Ht 70.0 in | Wt 244.7 lb

## 2019-07-13 DIAGNOSIS — C649 Malignant neoplasm of unspecified kidney, except renal pelvis: Secondary | ICD-10-CM | POA: Diagnosis not present

## 2019-07-13 DIAGNOSIS — K769 Liver disease, unspecified: Secondary | ICD-10-CM | POA: Diagnosis not present

## 2019-07-13 DIAGNOSIS — C642 Malignant neoplasm of left kidney, except renal pelvis: Secondary | ICD-10-CM | POA: Diagnosis not present

## 2019-07-13 DIAGNOSIS — R600 Localized edema: Secondary | ICD-10-CM | POA: Diagnosis not present

## 2019-07-13 DIAGNOSIS — C7951 Secondary malignant neoplasm of bone: Secondary | ICD-10-CM | POA: Diagnosis not present

## 2019-07-13 DIAGNOSIS — Z95828 Presence of other vascular implants and grafts: Secondary | ICD-10-CM

## 2019-07-13 DIAGNOSIS — Z79899 Other long term (current) drug therapy: Secondary | ICD-10-CM | POA: Insufficient documentation

## 2019-07-13 LAB — CBC WITH DIFFERENTIAL (CANCER CENTER ONLY)
Abs Immature Granulocytes: 0.02 10*3/uL (ref 0.00–0.07)
Basophils Absolute: 0 10*3/uL (ref 0.0–0.1)
Basophils Relative: 0 %
Eosinophils Absolute: 0.4 10*3/uL (ref 0.0–0.5)
Eosinophils Relative: 5 %
HCT: 38.1 % — ABNORMAL LOW (ref 39.0–52.0)
Hemoglobin: 12.1 g/dL — ABNORMAL LOW (ref 13.0–17.0)
Immature Granulocytes: 0 %
Lymphocytes Relative: 33 %
Lymphs Abs: 2.4 10*3/uL (ref 0.7–4.0)
MCH: 30.1 pg (ref 26.0–34.0)
MCHC: 31.8 g/dL (ref 30.0–36.0)
MCV: 94.8 fL (ref 80.0–100.0)
Monocytes Absolute: 0.5 10*3/uL (ref 0.1–1.0)
Monocytes Relative: 6 %
Neutro Abs: 4 10*3/uL (ref 1.7–7.7)
Neutrophils Relative %: 56 %
Platelet Count: 209 10*3/uL (ref 150–400)
RBC: 4.02 MIL/uL — ABNORMAL LOW (ref 4.22–5.81)
RDW: 14.8 % (ref 11.5–15.5)
WBC Count: 7.3 10*3/uL (ref 4.0–10.5)
nRBC: 0 % (ref 0.0–0.2)

## 2019-07-13 LAB — CMP (CANCER CENTER ONLY)
ALT: 27 U/L (ref 0–44)
AST: 18 U/L (ref 15–41)
Albumin: 3.5 g/dL (ref 3.5–5.0)
Alkaline Phosphatase: 49 U/L (ref 38–126)
Anion gap: 8 (ref 5–15)
BUN: 20 mg/dL (ref 8–23)
CO2: 27 mmol/L (ref 22–32)
Calcium: 9.2 mg/dL (ref 8.9–10.3)
Chloride: 103 mmol/L (ref 98–111)
Creatinine: 0.82 mg/dL (ref 0.61–1.24)
GFR, Est AFR Am: >60 mL/min (ref >60)
GFR, Estimated: >60 mL/min (ref >60)
Glucose, Bld: 111 mg/dL — ABNORMAL HIGH (ref 70–99)
Potassium: 4.3 mmol/L (ref 3.5–5.1)
Sodium: 138 mmol/L (ref 135–145)
Total Bilirubin: 0.3 mg/dL (ref 0.3–1.2)
Total Protein: 6.3 g/dL — ABNORMAL LOW (ref 6.5–8.1)

## 2019-07-13 LAB — TSH: TSH: 0.684 u[IU]/mL (ref 0.320–4.118)

## 2019-07-13 MED ORDER — PREDNISONE 50 MG PO TABS: ORAL_TABLET | ORAL | 0 refills | Status: DC

## 2019-07-13 MED ORDER — SODIUM CHLORIDE 0.9% FLUSH
10.0000 mL | Freq: Once | INTRAVENOUS | Status: AC
  Administered 2019-07-13: 10 mL
  Filled 2019-07-13: qty 10

## 2019-07-13 MED FILL — predniSONE 50 MG TABS: 50 | 1 days supply | Qty: 3 | Fill #0

## 2019-07-13 NOTE — Progress Notes (Signed)
[place] [facility] OFFICE PROGRESS NOTE   Diagnosis: Renal cell carcinoma  INTERVAL HISTORY:   Phillip [name] returns as scheduled. He continues cabozantinib.  Over the past week he and his wife have both noted improvement in appetite and energy.  He describes pain as "pretty good".  He is currently taking OxyContin 20 mg every morning and every afternoon.  No diarrhea or rash.  No unusual headaches.  No seizures.  Objective:  Vital signs in last 24 hours:  Pulse (!) 52, temperature 97.8 F (36.6 C), temperature source Temporal, resp. rate 17, height 5\' 10"  (1.778 m), weight 244 lb 11.2 oz (111 kg), SpO2 100 %.    HEENT: No thrush or ulcers. Resp: Lungs clear bilaterally. Cardio: Regular rate and rhythm. GI: Abdomen soft and nontender.  No hepatomegaly. Vascular: Trace edema at the lower legs bilaterally. Neuro: Alert and oriented. Skin: No rash. Port-A-Cath without erythema.   Lab Results:  Lab Results  Component Value Date   WBC 7.3 07/13/2019   HGB 12.1 (L) 07/13/2019   HCT 38.1 (L) 07/13/2019   MCV 94.8 07/13/2019   PLT 209 07/13/2019   NEUTROABS 4.0 07/13/2019    Imaging:  No results found.  Medications: I have reviewed the patient's current medications.  Assessment/Plan: 1. Metastatic renal cell carcinoma   L4 mass with extraosseous extension, L4 nerve compression  Biopsy of the L4 mass 11/18/2015 confirmed metastatic renal cell carcinoma, clear cell type  CTs of the chest, abdomen, and pelvis 11/18/2015-right lower lobe nodule, expansile lytic lesion at the right 11th rib/costal vertebral junction, left renal mass, expansile lesion involving the L4 vertebra, lytic lesion at the left acetabulum, and a low-attenuation liver lesion  Initiation of SRS to L4 12/02/2015, Completed 12/12/2015  Initiation of Pazopanib11/04/2015  Pazopanibplaced on hold 02/06/2016 secondary to elevated liver enzymes  Pazopanibresumed 03/07/2016 at a dose of 400 mg  daily  Pazopanibdiscontinued 03/19/2016 secondary to elevated liver enzymes  Restaging CTs 04/02/2016-stable left renal mass, decreased soft tissue component associated with the L4 metastasis, increased soft tissue component associated with the right 11th rib metastasis with increased T11 bony destruction, increased sclerosis at the left acetabulum lesion  Cycle 1 nivolumab 04/12/2016  Cycle 2 nivolumab 04/26/2016  Cycle 3 nivolumab03/16/2018  Cycle 4 nivolumab 05/24/2016  Cycle 5 nivolumab 06/08/2016  MRI lumbar spine 06/21/2016-unchanged tumor at L3, increased size of retroperitoneal lymph nodes compared to a CT from 04/02/2016  Cycle 6 nivolumab 06/22/2016  CTs chest, abdomen, and pelvis 07/04/2016-enlargement of the left renal mass, right adrenal nodule, left hilar and peritoneal lymph nodes, enlargement of left acetabular lesion. Stable lung nodules.  Cycle 7 nivolumab05/12/2016   Cycle 8 nivolumab 07/20/2016  Cycle 9 nivolumab 08/02/2016  Cycle 10 nivolumab 08/20/2016  Restaging CT 09/03/2016 evaluation with stable disease  Cycle 11 nivolumab 09/05/2016  Cycle 12 nivolumab 09/19/2016  Cycle 13 nivolumab 10/03/2016  Cycle 14 nivolumab 10/17/2016  Cycle 15 nivolumab 10/31/2016  Cycle 16 nivolumab 11/14/2016 (changed to monthly schedule)  Cycle 17nivolumab10/24/2018  CTs 01/21/2017-increased left renal mass, increased size of adrenal metastases, increased lytic bone lesions, increased left lung nodule, persistent tumor at L4 with probable epidural component  Initiation of Cabozantinib12/04/2016  Restaging CTs 05/30/2017-decreased size of left hilar mass, left renal mass, retroperitoneal adenopathy, and adrenal metastasis. Healing bone lesions.  Cabozantinib continued  CTs 10/21/2017-interval enlargement left hilar lymph node; stable rib lesions; stable mass left renal cortex; stable mildly nodular adrenal glands; stable lytic lesions within the pelvis  and spine.  Cabozantinib continued  CTs 02/21/2018-enlargement of an AP window lymph node. Mildly enlarged left hilar lymph node is unchanged. Primary renal cell carcinoma involving the upper pole of the left kidney appears similar. Stable enlarged right periaortic lymph node adjacent to the renal vessels. Stable multifocal bony metastatic disease.  Cabozantinibcontinued  CTs 07/29/2018-stable 15 mm AP window nodes; stable left hilar node; subcarinal node slightly larger; right periaortic node 16 mm, previously 12 mm; portacaval node 24 mm, previously 16 mm; 15 mm node superior to the pancreatic head has enlarged; interval increase nodularity of both adrenal glands; stable left kidney mass; multifocal bony metastatic disease not significantly changed.  Cabozantinib continued  CTs 11/06/2018-moderate improvement in thoracic adenopathy; minimal improvement abdominal adenopathy; left kidney upper pole mass and various lytic expansile bone lesions stable; mild increase in nodularity of left adrenal gland; right adrenal gland nodularity stable.  Cabozantinib continued  Clinical evidence of partial seizure activity December 2020  CT head 02/25/2019-extensive vasogenic edema, left greater than right. Peripheral enhancing masses consistent with metastases. No hemorrhage.  MRI brain 03/11/2019-7 enhancing brain masses consistent with metastatic disease  SRS to 7 brain lesions, treatment given 03/19/2019, 03/23/2019, and 03/25/2019  CTs 04/09/2019-decrease in left renal mass, bilateral adrenal nodules, AP window and porta hepatic adenopathy. New 9 mm right lower lobe nodule. Improved lytic lesion of the left acetabulum. Other bone lesions are stable.  Cabozantinib continued 2. Pain secondary to #1-managed by Dr. Lovenia Shuck.Improved 3. Hypertension 3. Elevated transaminases 02/06/2016-Pazopanibplaced on hold  Liver enzymes normal 03/07/2016 5. Port-A-Cath placement  05/16/2016 6. Malaise/anorexia 09/05/2016. Cortisol and testosterone levels low. Hydrocortisone and testosterone replacement initiated. 7. Conjunctival/scleral erythema 09/19/2016-resolved with steroid eyedrops 8. 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 9. Hypercalcemia status post Zometa 01/23/2017-resolved 10. Pruritic rash following IV contrast 10/21/2017;rash following IV contrast 02/21/2018 despite prednisone/Benadryl premedication 11. Brief episodes of expressive aphasia October and December 2020   Disposition: Phillip [name] appears stable.  There is no clinical evidence of disease progression.  Plan to continue cabozantinib.  He will undergo restaging CTs in the next 4 to 5 weeks.  We reviewed the CBC and chemistry panel from today.  Labs adequate to continue cabozantinib.  He will return for a follow-up appointment on 08/20/2019, CTs a few days prior.  He will contact the office in the interim with any problems.      [name] ANP/GNP-BC   07/13/2019  2:15 PM

## 2019-07-17 ENCOUNTER — Ambulatory Visit (HOSPITAL_COMMUNITY)
Admission: RE | Admit: 2019-07-17 | Discharge: 2019-07-17 | Disposition: A | Discharge status: Home / Self Care | Payer: PPO | Source: Ambulatory Visit | Attending: Radiation Oncology | Admitting: Radiation Oncology

## 2019-07-17 ENCOUNTER — Telehealth: Payer: Self-pay

## 2019-07-17 ENCOUNTER — Other Ambulatory Visit: Payer: Self-pay

## 2019-07-17 DIAGNOSIS — C7931 Secondary malignant neoplasm of brain: Secondary | ICD-10-CM | POA: Insufficient documentation

## 2019-07-17 DIAGNOSIS — C7949 Secondary malignant neoplasm of other parts of nervous system: Secondary | ICD-10-CM | POA: Insufficient documentation

## 2019-07-17 MED ORDER — GADOBUTROL 1 MMOL/ML IV SOLN
10.0000 mL | Freq: Once | INTRAVENOUS | Status: AC | PRN
  Administered 2019-07-17: 10 mL via INTRAVENOUS

## 2019-07-17 MED ORDER — HEPARIN SOD (PORK) LOCK FLUSH 100 UNIT/ML IV SOLN
500.0000 [IU] | INTRAVENOUS | Status: AC | PRN
  Administered 2019-07-17: 500 [IU]
  Filled 2019-07-17: qty 5

## 2019-07-17 NOTE — Telephone Encounter (Signed)
Left VM msg to call back in regards to appointment with Shona Simpson PA on 07/20/19. Advised to call [phone]

## 2019-07-20 ENCOUNTER — Telehealth: Payer: Self-pay

## 2019-07-20 ENCOUNTER — Other Ambulatory Visit: Payer: Self-pay | Admitting: Radiation Oncology

## 2019-07-20 ENCOUNTER — Encounter: Payer: Self-pay | Admitting: Radiation Oncology

## 2019-07-20 ENCOUNTER — Inpatient Hospital Stay: Payer: PPO

## 2019-07-20 ENCOUNTER — Other Ambulatory Visit: Payer: Self-pay

## 2019-07-20 ENCOUNTER — Ambulatory Visit
Admission: RE | Admit: 2019-07-20 | Discharge: 2019-07-20 | Disposition: A | Discharge status: Home / Self Care | Payer: PPO | Source: Ambulatory Visit | Attending: Radiation Oncology | Admitting: Radiation Oncology

## 2019-07-20 DIAGNOSIS — Z85528 Personal history of other malignant neoplasm of kidney: Secondary | ICD-10-CM | POA: Diagnosis not present

## 2019-07-20 DIAGNOSIS — C7931 Secondary malignant neoplasm of brain: Secondary | ICD-10-CM | POA: Diagnosis not present

## 2019-07-20 DIAGNOSIS — C649 Malignant neoplasm of unspecified kidney, except renal pelvis: Secondary | ICD-10-CM

## 2019-07-20 DIAGNOSIS — Z08 Encounter for follow-up examination after completed treatment for malignant neoplasm: Secondary | ICD-10-CM | POA: Diagnosis not present

## 2019-07-20 DIAGNOSIS — R9089 Other abnormal findings on diagnostic imaging of central nervous system: Secondary | ICD-10-CM | POA: Diagnosis not present

## 2019-07-20 MED ORDER — PREDNISONE 50 MG PO TABS: ORAL_TABLET | ORAL | 3 refills | Status: DC

## 2019-07-20 MED FILL — predniSONE 50 MG TABS: 50 | 1 days supply | Qty: 3 | Fill #0

## 2019-07-20 NOTE — Progress Notes (Signed)
Radiation Oncology         [phone] ________________________________  Follow Up Outpatient Consultation - Conducted via telephone due to current COVID-19 concerns for limiting patient exposure  I spoke with the patient to conduct this consult visit via telephone to spare the patient unnecessary potential exposure in the healthcare setting during the current COVID-19 pandemic. The patient was notified in advance and was offered a [place] meeting to allow for face to face communication but unfortunately reported that they did not have the appropriate resources/technology to support such a visit and instead preferred to proceed with a telephone consult.   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 He received SBRT treatment to the spine and is under the care of Dr. SherrilBenay Spiceical oncology. 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 He's being followed in brain and oncology conference and by Dr. SherrilBenay Spiceurrent systemic therapy is Cabozantinib and will have restaging in a few weeks. He had an MRI of the brain on 07/17/19 that revealed resolution of one of the 7 lesions, (specifically this was the right frontal cortex lesion that previously measured 4 mm) and stable post treatment appearance in the other 6 sites. He did have a new punctate lesion in the superior right cerebellum demonstrated on multiple views. This morning in brain oncology conference, it was recommended that he consider SRS to this site as well. He's contacted by phone to discuss this.    On review of systems, the patient reports that he is doing well overall. He reports he is not having seizure activity. He does not verbalize any other symptoms of concern.    Past Medical History:  Past Medical History:  Diagnosis Date  . Anxiety   . Brain cancer ([name])   [place]    renal cancer  . Constipation   . Depression   . Hypertension   . Low testosterone in male   . 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 Seizures ([name])   [place] seizure 01/2019 - controlled since on keppra  . Wears glasses     Past Surgical History: Past Surgical History:  Procedure Laterality Date  . insertion port-a-cath  2018  . IR GENERIC HISTORICAL  11/18/2015   IR FLUORO GUIDED NEEDLE PLC  ASPIRATION/INJECTION LOC 11/18/2015 [name], MD [facility]-INTERV RAD  . IR GENERIC HISTORICAL  05/16/2016   IR FLUORO GUIDE PORT INSERTION RIGHT 05/16/2016 [name], MD [facility]-INTERV RAD  . IR GENERIC HISTORICAL  05/16/2016   IR US GUIDE VASC ACCESS RIGHT 05/16/2016 [name], MD [facility]-INTERV RAD  . RADIOLOGY WITH ANESTHESIA N/A 06/16/2019   Procedure: MRI BRAIN WITH AND WITHOUT CONTRAST WITH ANESTHESIA;  Surgeon: Radiologist, Medication, MD;  Location: [facility];  Service: Radiology;  Laterality: N/A;    Social History:  Social  History   Socioeconomic History  . Marital status: Single    Spouse name: Not on file  . Number of children: Not on file  . Years of education: Not on file  . Highest education level: Not on file  Occupational History  . Not on file  Tobacco Use  . Smoking status: Never Smoker  . Smokeless tobacco: Never Used  Substance and Sexual Activity  . Alcohol use: Not Currently    Alcohol/week: 1.0 standard drinks    Types: 1 Glasses of wine per week  . Drug use: Yes    Types: Marijuana    Comment: Last use 06/13/19  . Sexual activity: Not Currently  Other Topics Concern  . Not on file  Social History Narrative  . Not on file   Social Determinants of Health   Financial Resource Strain:   . Difficulty of Paying Living Expenses:   Food Insecurity:   . Worried About Charity fundraiser in the Last Year:   . Arboriculturist in the Last Year:   Transportation Needs:   . Film/video editor (Medical):   [name] Lack of Transportation (Non-Medical):   Physical Activity:   . Days of Exercise per Week:   . Minutes of Exercise per Session:   Stress:   . Feeling of Stress :   Social Connections:   . Frequency of Communication with Friends and Family:   . Frequency of Social Gatherings with Friends and Family:   . Attends Religious Services:   . Active Member of Clubs or Organizations:   . Attends Archivist Meetings:   [name] Marital Status:   Intimate Partner Violence:   . Fear of Current or Ex-Partner:   . Emotionally Abused:   [name] Physically Abused:   . Sexually Abused:   The patient is in a relationship with [name]. She is a previous Veterinary surgeon within [employer]. He is retired and they live in [place]. He is retired from working as a Scientific laboratory technician.   Family History: Family History  Problem Relation Age of Onset  . Cancer Grandchild        lymphoma    ALLERGIES:  is allergic to contrast media [iodinated diagnostic agents].  Meds: Current Outpatient Medications    Medication Sig Dispense Refill  . acetaminophen (TYLENOL) 650 MG CR tablet Take 650 mg by mouth every 8 (eight) hours as needed for pain.    [name] CABOMETYX 40 MG tablet TAKE 1 TABLET (40 MG TOTAL) BY MOUTH DAILY. TAKE 1 HOUR BEFORE OR 2 HOURS AFTER MEALS. (Patient taking differently: Take 40 mg by mouth daily. ) 30 tablet 2  . diphenhydrAMINE (BENADRYL) 50 MG tablet Take 1 tablet (50 mg total) by mouth as directed. Take Benadryl 50 mg by mouth 1 hour before CT scan. 1 tablet 0  . docusate sodium (COLACE) 100 MG capsule Take 100 mg by mouth 2 (two) times daily.    . hydrocortisone (CORTEF)  10 MG tablet Take 2 tablets by mouth in the morning and take 1 tablet in the evening 270 tablet 3  . ibuprofen (ADVIL,MOTRIN) 200 MG tablet Take 200 mg by mouth every 8 (eight) hours as needed for fever, headache, mild pain, moderate pain or cramping.     . levETIRAcetam (KEPPRA) 500 MG tablet TAKE 1 TABLET BY MOUTH TWICE DAILY 60 tablet 1  . lidocaine-prilocaine (EMLA) cream APPLY TO PORTACATH 1 HOUR PRIOR TO USE AS NEEDED 30 g 2  . LORazepam (ATIVAN) 0.5 MG tablet Take 1-2 tablets (0.5-1 mg total) by mouth every 6 (six) hours as needed for anxiety. 60 tablet 2  . LORazepam (ATIVAN) 1 MG tablet Take 1 tablet (1 mg total) by mouth as directed. Take 1 hour prior to MRI 1 tablet 0  . losartan-hydrochlorothiazide (HYZAAR) 100-12.5 MG tablet Take 1 tablet by mouth daily.     . mirtazapine (REMERON) 30 MG tablet Take 1 tablet (30 mg total) by mouth at bedtime. 90 tablet 1  . ondansetron (ZOFRAN) 8 MG tablet TAKE 1 TABLET BY MOUTH EVERY 8 HOURS AS NEEDED FOR NAUSEA AND VOMITING 90 tablet 2  . oxyCODONE (OXYCONTIN) 10 mg 12 hr tablet Take 20 mg by mouth every morning.     . OXYCONTIN 10 MG 12 hr tablet Take 20 mg by mouth at bedtime.  0  . polyethylene glycol (MIRALAX / GLYCOLAX) 17 g packet Take 17 g by mouth daily.    . predniSONE (DELTASONE) 50 MG tablet Take 50 mg 13 hours, 7 hours and 1 hour prior to CT scan. Hold  decadron on days taking prednisone 3 tablet 0  . Testosterone 20.25 MG/ACT (1.62%) GEL APPLY 2 PUMPS ONCE A DAY AS DIRECTED 75 g 3   No current facility-administered medications for this encounter.   Facility-Administered Medications Ordered in Other Encounters  Medication Dose Route Frequency Provider Last Rate Last Admin  . sodium chloride flush (NS) 0.9 % injection 10 mL  10 mL Intravenous PRN [name], MD   10 mL at 06/22/16 8242    Physical Findings: Unable to assess due to encounter type.  Lab Findings: Lab Results  Component Value Date   WBC 7.3 07/13/2019   HGB 12.1 (L) 07/13/2019   HCT 38.1 (L) 07/13/2019   MCV 94.8 07/13/2019   PLT 209 07/13/2019     Radiographic Findings: MR Brain W Wo Contrast  Result Date: 07/17/2019 CLINICAL DATA:  Secondary malignant neoplasm of brain and spinal cord. Follow-up treated brain metastases. Brain/CNS neoplasm, surveillance. Additional history obtained from [place] to 7 brain lesions 03/19/2019, 03/23/2019 and 03/25/2019 EXAM: MRI HEAD WITHOUT AND WITH CONTRAST TECHNIQUE: Multiplanar, multiecho pulse sequences of the brain and surrounding structures were obtained without and with intravenous contrast. CONTRAST:  70m GADAVIST GADOBUTROL 1 MMOL/ML IV SOLN COMPARISON:  Brain MRI 03/11/2019 FINDINGS: Brain: Again demonstrated are multiple enhancing intracranial lesions as follows. An 18 mm lesion within the high left frontal cortex has slightly decreased in size (series 10, image 145) (previously 19 mm). Surrounding edema has decreased. A 10 mm lesion within the paramedian high left frontal cortex has decreased in size (series 10, image 130) (previously 12 mm). Surrounding edema has decreased. An 8 mm lesion within the left parietal cortex has decreased in size (series 10, image 109) (previously 9 mm). An 11 mm lesion along the left occipital cortex has decreased in size (series 10, image 55) (previously 16 mm).  Surrounding edema has decreased. A  21 mm lesion along the inferior left temporal lobe with broad contact with the dura has decreased in size and now demonstrates noncontiguous enhancement (series 10, image 53) (previously 31 mm). Surrounding edema has decreased. An 8 mm lesion within the right occipital cortex has decreased in size (series 10, image 71) (previously 9 mm). Surrounding edema has decreased. A previously demonstrated 4 mm lesion within the posterior right frontal cortex is no longer appreciable. As before, there are nonacute blood products associated with several of these lesions. There is a punctate enhancing metastasis within the superior right cerebellum (series 10, image 49) (series 12, image 18). This was not present on prior MRI. Background mild ill-defined hypoattenuation within the cerebral white matter is nonspecific, but consistent with chronic small vessel ischemic disease. Redemonstrated chronic lacunar infarct within the left corona radiata. No extra-axial fluid collection. No midline shift. Vascular: Expected proximal arterial flow voids. Skull and upper cervical spine: No focal marrow lesion. Sinuses/Orbits: Visualized orbits show no acute finding. Mild ethmoid and maxillary sinus mucosal thickening. No significant mastoid effusion. IMPRESSION: A treated 4 mm lesion within the posterior right frontal cortex is no longer appreciable. The six remaining treated lesions have decreased in size since MRI 03/11/2019. Surrounding edema has also decreased. There is a punctate metastasis within the superior right cerebellum which is new from the prior exam. Electronically Signed   By: Kellie Simmering DO   On: 07/17/2019 15:14    Impression/Plan: 1. 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 The patient is clinically stable. He was aware of his imaging results after seeing them in [place]. We discussed the conversation from brain and spine conference this morning, and  would like to offer SRS to the punctate lesion in the right cerebellum. We discussed the risks, benefits, short, and long term effects of radiotherapy, and the patient is interested in proceeding. Dr. [name] anticipates a single fraction to the right cerebellum. He will come in on Wednesday for IV contrast and simulation at which time he will sign written consent to proceed.  2. Claustrophobia. The patient has Ativan and is aware to take this 30 minutes prior to MRI, simulation, and treatment. 3. IV Contrast sensitivity. The patient has safely premedicated prior to IV contrast for CT imaging. A new rx was sent in for prednisone and he knows to also take Bendryl 1 hour prior to contrast for simulation.  4. Seizure activity. The patient has not had seizures since he received treatment in January. He does not have a neurologist managing his antiepileptic medication and I'd like to introduce him to Dr. Mickeal Skinner for help in determining how long he needs to remain on this or if his dose needs to be adjusted.   Given current concerns for patient exposure during the COVID-19 pandemic, this encounter was conducted via telephone.  The patient has provided two factor identification and has given verbal consent for this type of encounter and has been advised to only accept a meeting of this type in a secure network environment. The time spent during this encounter was 45 minutes including preparation, discussion, and coordination of the patient's care. The attendants for this meeting include Hayden Pedro  and [name] and [name]. During the encounter, Hayden Pedro was located at home remotely [employer] and [name] was located at home.      [name], PAC

## 2019-07-20 NOTE — Telephone Encounter (Signed)
Appointment reminder and to complete meaningful use

## 2019-07-21 NOTE — Progress Notes (Signed)
Has armband been applied?  Yes  Does patient have an allergy to IV contrast dye?: Yes   Has patient ever received premedication for IV contrast dye?: Yes  Does patient take metformin?: No  If patient does take metformin when was the last dose: n/a  Date of lab work: 07/13/2019 BUN: 20 CR: 0.82 EGfr: >60  IV site: Right Chest Port  Has IV site been added to flowsheet?  Yes

## 2019-07-22 ENCOUNTER — Ambulatory Visit
Admission: RE | Admit: 2019-07-22 | Discharge: 2019-07-22 | Disposition: A | Discharge status: Home / Self Care | Payer: PPO | Source: Ambulatory Visit | Attending: Radiation Oncology | Admitting: Radiation Oncology

## 2019-07-22 ENCOUNTER — Other Ambulatory Visit: Payer: Self-pay

## 2019-07-22 ENCOUNTER — Ambulatory Visit: Payer: PPO | Admitting: Radiation Oncology

## 2019-07-22 DIAGNOSIS — C7931 Secondary malignant neoplasm of brain: Secondary | ICD-10-CM

## 2019-07-22 DIAGNOSIS — C649 Malignant neoplasm of unspecified kidney, except renal pelvis: Secondary | ICD-10-CM | POA: Insufficient documentation

## 2019-07-22 MED ORDER — SODIUM CHLORIDE 0.9% FLUSH
10.0000 mL | Freq: Once | INTRAVENOUS | Status: AC
  Administered 2019-07-22: 10 mL via INTRAVENOUS

## 2019-07-22 MED ORDER — HEPARIN SOD (PORK) LOCK FLUSH 100 UNIT/ML IV SOLN
500.0000 [IU] | Freq: Once | INTRAVENOUS | Status: AC
  Administered 2019-07-22: 500 [IU] via INTRAVENOUS

## 2019-07-22 MED FILL — LOSARTAN-HCTZ 100-12.5 MG T: 100-12.5 | 30 days supply | Qty: 30 | Fill #5

## 2019-07-22 MED FILL — levETIRAcetam 500 MG TABS: 500 | 30 days supply | Qty: 60 | Fill #1

## 2019-07-22 MED FILL — HYDROCORTISONE 10 MG TABLET: 10 | 30 days supply | Qty: 90 | Fill #9

## 2019-07-22 MED FILL — OxyCONTIN 10 MG T12A: 10 | 30 days supply | Qty: 90 | Fill #0

## 2019-07-22 MED FILL — MIRTAZAPINE 30 MG TABLET: 30 | 90 days supply | Qty: 90 | Fill #0

## 2019-07-24 ENCOUNTER — Other Ambulatory Visit: Payer: Self-pay | Admitting: *Deleted

## 2019-07-24 MED ORDER — TESTOSTERONE 20.25 MG/ACT (1.62%) TD GEL: TRANSDERMAL | 3 refills | Status: DC

## 2019-07-24 MED FILL — TESTOSTERONE 20.25 MG/ACT (: 20.25 MG/AC | 30 days supply | Qty: 75 | Fill #0

## 2019-07-29 ENCOUNTER — Ambulatory Visit: Payer: PPO | Admitting: Radiation Oncology

## 2019-07-29 DIAGNOSIS — C7931 Secondary malignant neoplasm of brain: Secondary | ICD-10-CM | POA: Insufficient documentation

## 2019-07-29 DIAGNOSIS — C649 Malignant neoplasm of unspecified kidney, except renal pelvis: Secondary | ICD-10-CM | POA: Diagnosis not present

## 2019-07-30 ENCOUNTER — Other Ambulatory Visit: Payer: Self-pay

## 2019-07-30 ENCOUNTER — Ambulatory Visit
Admission: RE | Admit: 2019-07-30 | Discharge: 2019-07-30 | Disposition: A | Discharge status: Home / Self Care | Payer: PPO | Source: Ambulatory Visit | Attending: Radiation Oncology | Admitting: Radiation Oncology

## 2019-07-30 DIAGNOSIS — C7931 Secondary malignant neoplasm of brain: Secondary | ICD-10-CM | POA: Diagnosis not present

## 2019-07-30 NOTE — Op Note (Signed)
  Name: Phillip [name]  MRN: [id number]  Date: 07/30/2019   DOB: 1945/03/16  Stereotactic Radiosurgery Operative Note  PRE-OPERATIVE DIAGNOSIS:  Solitary Brain Metastasis  POST-OPERATIVE DIAGNOSIS:  Solitary Brain Metastasis  PROCEDURE:  Stereotactic Radiosurgery  SURGEON:  Peggyann Shoals, MD  NARRATIVE: The patient underwent a radiation treatment planning session in the radiation oncology simulation suite under the care of the radiation oncology physician and physicist.  I participated closely in the radiation treatment planning afterwards. The patient underwent planning CT which was fused to 3T high resolution MRI with 1 mm axial slices.  These images were fused on the planning system.  We contoured the gross target volumes and subsequently expanded this to yield the Planning Target Volume. I actively participated in the planning process.  I helped to define and review the target contours and also the contours of the optic pathway, eyes, brainstem and selected nearby organs at risk.  All the dose constraints for critical structures were reviewed and compared to AAPM Task Group 101.  The prescription dose conformity was reviewed.  I approved the plan electronically.    Accordingly, Phillip [name] was brought to the TrueBeam stereotactic radiation treatment linac and placed in the custom immobilization mask.  The patient was aligned according to the IR fiducial markers with BrainLab Exactrac, then orthogonal x-rays were used in ExacTrac with the 6DOF robotic table and the shifts were made to align the patient  Phillip [name] received stereotactic radiosurgery uneventfully.    The detailed description of the procedure is recorded in the radiation oncology procedure note.  I was present for the duration of the procedure.  DISPOSITION:  Following delivery, the patient was transported to nursing in stable condition and monitored for possible acute effects to be discharged to home in stable  condition with follow-up in one month.  Peggyann Shoals, MD 07/30/2019 4:12 PM

## 2019-07-30 NOTE — Progress Notes (Signed)
Phillip [name] rested with [name] for 30 minutes following his SRS treatment.  Patient denies headache, dizziness, nausea, diplopia or ringing in the ears. Denies fatigue. Patient without complaints. Understands to avoid strenuous activity for the next 24 hours and call [phone] with needs.   BP 128/65 (BP Location: Left Arm, Patient Position: Sitting, Cuff Size: Large)   Pulse (!) 50   Temp 98.1 F (36.7 C)   Resp 20   SpO2 100%    [name] M. Leonie Green, BSN

## 2019-08-13 ENCOUNTER — Telehealth: Payer: Self-pay | Admitting: Oncology

## 2019-08-13 MED FILL — CABOMETYX 40 MG TABLET: 40 | 30 days supply | Qty: 30 | Fill #2

## 2019-08-13 NOTE — Telephone Encounter (Signed)
R/s appt per 6/17 sch message - no answer left message with with appt date and time

## 2019-08-17 ENCOUNTER — Inpatient Hospital Stay: Payer: PPO | Attending: Oncology

## 2019-08-17 ENCOUNTER — Inpatient Hospital Stay: Payer: PPO

## 2019-08-17 ENCOUNTER — Other Ambulatory Visit: Payer: Self-pay

## 2019-08-17 ENCOUNTER — Ambulatory Visit (HOSPITAL_COMMUNITY)
Admission: RE | Admit: 2019-08-17 | Discharge: 2019-08-17 | Disposition: A | Discharge status: Home / Self Care | Payer: PPO | Source: Ambulatory Visit | Attending: Nurse Practitioner | Admitting: Nurse Practitioner

## 2019-08-17 DIAGNOSIS — I10 Essential (primary) hypertension: Secondary | ICD-10-CM | POA: Diagnosis not present

## 2019-08-17 DIAGNOSIS — F419 Anxiety disorder, unspecified: Secondary | ICD-10-CM | POA: Diagnosis not present

## 2019-08-17 DIAGNOSIS — N2889 Other specified disorders of kidney and ureter: Secondary | ICD-10-CM | POA: Diagnosis not present

## 2019-08-17 DIAGNOSIS — F329 Major depressive disorder, single episode, unspecified: Secondary | ICD-10-CM | POA: Diagnosis not present

## 2019-08-17 DIAGNOSIS — C649 Malignant neoplasm of unspecified kidney, except renal pelvis: Secondary | ICD-10-CM | POA: Diagnosis not present

## 2019-08-17 DIAGNOSIS — Z791 Long term (current) use of non-steroidal anti-inflammatories (NSAID): Secondary | ICD-10-CM | POA: Diagnosis not present

## 2019-08-17 DIAGNOSIS — K8020 Calculus of gallbladder without cholecystitis without obstruction: Secondary | ICD-10-CM | POA: Insufficient documentation

## 2019-08-17 DIAGNOSIS — C7951 Secondary malignant neoplasm of bone: Secondary | ICD-10-CM | POA: Insufficient documentation

## 2019-08-17 DIAGNOSIS — Z79899 Other long term (current) drug therapy: Secondary | ICD-10-CM | POA: Insufficient documentation

## 2019-08-17 DIAGNOSIS — I700 Atherosclerosis of aorta: Secondary | ICD-10-CM | POA: Diagnosis not present

## 2019-08-17 DIAGNOSIS — G40909 Epilepsy, unspecified, not intractable, without status epilepticus: Secondary | ICD-10-CM | POA: Insufficient documentation

## 2019-08-17 DIAGNOSIS — N189 Chronic kidney disease, unspecified: Secondary | ICD-10-CM | POA: Diagnosis not present

## 2019-08-17 DIAGNOSIS — C7931 Secondary malignant neoplasm of brain: Secondary | ICD-10-CM | POA: Diagnosis not present

## 2019-08-17 DIAGNOSIS — E042 Nontoxic multinodular goiter: Secondary | ICD-10-CM | POA: Diagnosis not present

## 2019-08-17 DIAGNOSIS — M79604 Pain in right leg: Secondary | ICD-10-CM | POA: Insufficient documentation

## 2019-08-17 DIAGNOSIS — Z7952 Long term (current) use of systemic steroids: Secondary | ICD-10-CM | POA: Diagnosis not present

## 2019-08-17 DIAGNOSIS — R29818 Other symptoms and signs involving the nervous system: Secondary | ICD-10-CM | POA: Insufficient documentation

## 2019-08-17 DIAGNOSIS — C642 Malignant neoplasm of left kidney, except renal pelvis: Secondary | ICD-10-CM | POA: Insufficient documentation

## 2019-08-17 DIAGNOSIS — K5730 Diverticulosis of large intestine without perforation or abscess without bleeding: Secondary | ICD-10-CM | POA: Insufficient documentation

## 2019-08-17 DIAGNOSIS — N400 Enlarged prostate without lower urinary tract symptoms: Secondary | ICD-10-CM | POA: Insufficient documentation

## 2019-08-17 DIAGNOSIS — R220 Localized swelling, mass and lump, head: Secondary | ICD-10-CM | POA: Insufficient documentation

## 2019-08-17 DIAGNOSIS — Z95828 Presence of other vascular implants and grafts: Secondary | ICD-10-CM

## 2019-08-17 LAB — CBC WITH DIFFERENTIAL (CANCER CENTER ONLY)
Abs Immature Granulocytes: 0.06 10*3/uL (ref 0.00–0.07)
Basophils Absolute: 0 10*3/uL (ref 0.0–0.1)
Basophils Relative: 0 %
Eosinophils Absolute: 0 10*3/uL (ref 0.0–0.5)
Eosinophils Relative: 0 %
HCT: 41.7 % (ref 39.0–52.0)
Hemoglobin: 13.5 g/dL (ref 13.0–17.0)
Immature Granulocytes: 1 %
Lymphocytes Relative: 19 %
Lymphs Abs: 1.2 10*3/uL (ref 0.7–4.0)
MCH: 29.8 pg (ref 26.0–34.0)
MCHC: 32.4 g/dL (ref 30.0–36.0)
MCV: 92.1 fL (ref 80.0–100.0)
Monocytes Absolute: 0.1 10*3/uL (ref 0.1–1.0)
Monocytes Relative: 1 %
Neutro Abs: 5.1 10*3/uL (ref 1.7–7.7)
Neutrophils Relative %: 79 %
Platelet Count: 235 10*3/uL (ref 150–400)
RBC: 4.53 MIL/uL (ref 4.22–5.81)
RDW: 14.4 % (ref 11.5–15.5)
WBC Count: 6.5 10*3/uL (ref 4.0–10.5)
nRBC: 0 % (ref 0.0–0.2)

## 2019-08-17 LAB — CMP (CANCER CENTER ONLY)
ALT: 30 U/L (ref 0–44)
AST: 17 U/L (ref 15–41)
Albumin: 3.6 g/dL (ref 3.5–5.0)
Alkaline Phosphatase: 63 U/L (ref 38–126)
Anion gap: 11 (ref 5–15)
BUN: 14 mg/dL (ref 8–23)
CO2: 27 mmol/L (ref 22–32)
Calcium: 9.6 mg/dL (ref 8.9–10.3)
Chloride: 103 mmol/L (ref 98–111)
Creatinine: 0.9 mg/dL (ref 0.61–1.24)
GFR, Est AFR Am: >60 mL/min (ref >60)
GFR, Estimated: >60 mL/min (ref >60)
Glucose, Bld: 181 mg/dL — ABNORMAL HIGH (ref 70–99)
Potassium: 4.5 mmol/L (ref 3.5–5.1)
Sodium: 141 mmol/L (ref 135–145)
Total Bilirubin: 0.2 mg/dL — ABNORMAL LOW (ref 0.3–1.2)
Total Protein: 6.7 g/dL (ref 6.5–8.1)

## 2019-08-17 LAB — TSH: TSH: 0.204 u[IU]/mL — ABNORMAL LOW (ref 0.320–4.118)

## 2019-08-17 MED ORDER — SODIUM CHLORIDE (PF) 0.9 % IJ SOLN
INTRAMUSCULAR | Status: AC
  Filled 2019-08-17: qty 50

## 2019-08-17 MED ORDER — SODIUM CHLORIDE 0.9% FLUSH
10.0000 mL | Freq: Once | INTRAVENOUS | Status: AC
  Administered 2019-08-17: 10 mL
  Filled 2019-08-17: qty 10

## 2019-08-17 MED ORDER — IOHEXOL 300 MG/ML  SOLN
100.0000 mL | Freq: Once | INTRAMUSCULAR | Status: AC | PRN
  Administered 2019-08-17: 100 mL via INTRAVENOUS

## 2019-08-17 MED ORDER — HEPARIN SOD (PORK) LOCK FLUSH 100 UNIT/ML IV SOLN
INTRAVENOUS | Status: AC
  Filled 2019-08-17: qty 5

## 2019-08-17 MED ORDER — HEPARIN SOD (PORK) LOCK FLUSH 100 UNIT/ML IV SOLN
500.0000 [IU] | Freq: Once | INTRAVENOUS | Status: AC
  Administered 2019-08-17: 500 [IU] via INTRAVENOUS

## 2019-08-18 ENCOUNTER — Other Ambulatory Visit: Payer: Self-pay | Admitting: Oncology

## 2019-08-18 MED FILL — HYDROCORTISONE 10 MG TABLET: 10 | 30 days supply | Qty: 90 | Fill #10

## 2019-08-18 MED FILL — LOSARTAN-HCTZ 100-12.5 MG T: 100-12.5 | 30 days supply | Qty: 30 | Fill #6

## 2019-08-18 MED FILL — OxyCONTIN 10 MG T12A: 10 | 30 days supply | Qty: 120 | Fill #0

## 2019-08-18 MED FILL — LIDOCAINE-PRILOCAINE 2.5-2.: 2.5-2.5 | 30 days supply | Qty: 30 | Fill #1

## 2019-08-18 MED FILL — levETIRAcetam 500 MG TABS: 500 | 30 days supply | Qty: 60 | Fill #0

## 2019-08-20 ENCOUNTER — Inpatient Hospital Stay: Payer: PPO | Admitting: Oncology

## 2019-08-20 ENCOUNTER — Inpatient Hospital Stay (HOSPITAL_BASED_OUTPATIENT_CLINIC_OR_DEPARTMENT_OTHER): Payer: PPO | Admitting: Internal Medicine

## 2019-08-20 ENCOUNTER — Other Ambulatory Visit: Payer: Self-pay

## 2019-08-20 VITALS — BP 135/76 | HR 50 | Temp 97.6°F | Resp 18 | Ht 70.0 in | Wt 245.7 lb

## 2019-08-20 DIAGNOSIS — R569 Unspecified convulsions: Secondary | ICD-10-CM | POA: Diagnosis not present

## 2019-08-20 DIAGNOSIS — C7931 Secondary malignant neoplasm of brain: Secondary | ICD-10-CM | POA: Diagnosis not present

## 2019-08-20 DIAGNOSIS — C649 Malignant neoplasm of unspecified kidney, except renal pelvis: Secondary | ICD-10-CM | POA: Diagnosis not present

## 2019-08-20 DIAGNOSIS — C642 Malignant neoplasm of left kidney, except renal pelvis: Secondary | ICD-10-CM | POA: Diagnosis not present

## 2019-08-20 NOTE — Progress Notes (Signed)
[facility] [place], [place] [address] [phone]   New Patient Evaluation  Date of Service: 08/20/19 Patient Name: Juvenal Umar Patient MRN: [id number] Patient DOB: [birth year] Provider: [name], MD  Identifying Statement:  Kayce Betty is a 74 y.o. 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    Referring Provider: [name], MD [facility] [place],  [place] [address]  Primary Cancer: Renal Cell Carcinoma  CNS Oncologic History: 03/19/19-03/25/19 SRS Treatment: PTV1 Lt Temporal 32m 27 Gy in 3 fractions PTV2 Lt Occipital 160m 27 Gy in 3 fractions PTV6 Lt Frontal 209m27 Gy in 3 fractions  03/19/19 SRS Treatment:  PTV3 Rt Occipital 25m47mTV4 Lt Frontal 12mm50mTV5 Rt Frontal 4mm  5m7 Lt Parietal 9mm  A44mof these were treated to 20 Gy in 1 fraction  12/02/2015 to 12/12/2015 SBRT Treatment:  The L4 Right spinal was treated to 50 Gy in 5 fractions at 10 Gy per fraction  History of Present Illness: The patient's records from the referring physician were obtained and reviewed and the patient interviewed to confirm this HPI.  Jayshawn Lee McCEpimenio Schetterts today for follow up and to establish neurologic care.  He initially presented to medical attention in December 2020 after a series of stereotypical events, later characterized as seizures.  He and his wife describe sudden onset of left gaze deviation and twisting of face on the left side, lasting for several minutes each time.  No other clinical component.  Since starting Keppra he has had no recurrent events, despite multiple course of radiosurgery.  Currently he feels at baseline but with increased fatigue, no focal complaints.  He denies headaches or gait instability.  Takes cabometyx for metastatic renal cell  carcinoma.   Medications: Current Outpatient Medications on File Prior to Visit  Medication Sig Dispense Refill  . acetaminophen (TYLENOL) 650 MG CR tablet Take 650 mg by mouth every 8 (eight) hours as needed for pain.    . CABOMMarland KitchenTYX 40 MG tablet TAKE 1 TABLET (40 MG TOTAL) BY MOUTH DAILY. TAKE 1 HOUR BEFORE OR 2 HOURS AFTER MEALS. (Patient taking differently: Take 40 mg by mouth daily. ) 30 tablet 2  . diphenhydrAMINE (BENADRYL) 50 MG tablet Take 1 tablet (50 mg total) by mouth as directed. Take Benadryl 50 mg by mouth 1 hour before CT scan. (Patient not taking: Reported on 08/20/2019) 1 tablet 0  . docusate sodium (COLACE) 100 MG capsule Take 100 mg by mouth 2 (two) times daily.    . hydrocortisone (CORTEF) 10 MG tablet Take 2 tablets by mouth in the morning and take 1 tablet in the evening 270 tablet 3  . ibuprofen (ADVIL,MOTRIN) 200 MG tablet Take 200 mg by mouth every 8 (eight) hours as needed for fever, headache, mild pain, moderate pain or cramping.     . levETIRAcetam (KEPPRA) 500 MG tablet TAKE 1 TABLET BY MOUTH TWICE DAILY 60 tablet 1  . lidocaine-prilocaine (EMLA) cream APPLY TO PORTACATH 1 HOUR PRIOR TO USE AS NEEDED 30 g 2  . LORazepam (ATIVAN) 0.5 MG tablet Take 1-2 tablets (0.5-1 mg total) by mouth every 6 (six) hours as needed for anxiety. (Patient not taking: Reported on 08/20/2019) 60 tablet 2  . LORazepam (ATIVAN) 1 MG tablet Take 1 tablet (1 mg total) by mouth as directed. Take 1  hour prior to MRI (Patient not taking: Reported on 08/20/2019) 1 tablet 0  . losartan-hydrochlorothiazide (HYZAAR) 100-12.5 MG tablet Take 1 tablet by mouth daily.     . mirtazapine (REMERON) 30 MG tablet Take 1 tablet (30 mg total) by mouth at bedtime. 90 tablet 1  . Multiple Vitamin (MULTIVITAMIN ADULT PO) Take 1 tablet by mouth daily. [place] product    . ondansetron (ZOFRAN) 8 MG tablet TAKE 1 TABLET BY MOUTH EVERY 8 HOURS AS NEEDED FOR NAUSEA AND VOMITING 90 tablet 2  . oxyCODONE (OXYCONTIN) 10 mg  12 hr tablet Take 20 mg by mouth every morning.     . OXYCONTIN 10 MG 12 hr tablet Take 20 mg by mouth at bedtime.  0  . polyethylene glycol (MIRALAX / GLYCOLAX) 17 g packet Take 17 g by mouth daily.    . predniSONE (DELTASONE) 50 MG tablet Take 50 mg 13 hours, 7 hours and 1 hour prior to CT scan. Hold decadron on days taking prednisone (Patient not taking: Reported on 08/20/2019) 3 tablet 3  . Testosterone 20.25 MG/ACT (1.62%) GEL APPLY 2 PUMPS ONCE A DAY AS DIRECTED 75 g 3   Current Facility-Administered Medications on File Prior to Visit  Medication Dose Route Frequency Provider Last Rate Last Admin  . sodium chloride flush (NS) 0.9 % injection 10 mL  10 mL Intravenous PRN [name], MD   10 mL at 06/22/16 [id number]    Allergies:  Allergies  Allergen Reactions  . Contrast Media [Iodinated Diagnostic Agents] Rash   Past Medical History:  Past Medical History:  Diagnosis Date  . Anxiety   . Brain cancer ([place])   . Chronic kidney disease    renal cancer  . Constipation   . Depression   . Hypertension   . Low testosterone in male   . 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 Seizures ([place])    last seizure 01/2019 - controlled since on keppra  . Wears glasses    Past Surgical History:  Past Surgical History:  Procedure Laterality Date  . insertion port-a-cath  2018  . IR GENERIC HISTORICAL  11/18/2015   IR FLUORO GUIDED NEEDLE PLC ASPIRATION/INJECTION LOC 11/18/2015 [name], MD [facility]INTER[facility] RAD  . IR GENERIC HISTORICAL  05/16/2016   IR FLUORO GUIDE PORT INSERTION RIGHT 05/16/2016 [name], MD [facility]INTERV RAD  . IR GENERIC HISTORICAL  05/16/2016   IR US GUIDE VASC ACCESS RIGHT 05/16/2016 [name], MD [facility]INTER[facility]  . RADIOLOGY WITH ANESTHESIA N/A 06/16/2019   Procedure: MRI BRAIN WITH AND WITHOUT CONTRAST WITH ANESTHESIA;  Surgeon: Radiologist, Medication, MD;  Location: [facility];  Service: Radiology;  Laterality: N/A;   Social History:  Social History    Socioeconomic History  . Marital status: Single    Spouse name: Not on file  . Number of children: Not on file  . Years of education: Not on file  . Highest education level: Not on file  Occupational History  . Not on file  Tobacco Use  . Smoking status: Never Smoker  . Smokeless tobacco: Never Used  Vaping Use  . Vaping Use: Never used  Substance and Sexual Activity  . Alcohol use: Not Currently    Alcohol/week: 1.0 standard drink    Types: 1 Glasses of wine per week  . Drug use: Yes    Types: Marijuana    Comment: Last use 06/13/19  . Sexual activity: Not Currently  Other Topics Concern  . Not  on file  Social History Narrative  . Not on file   Social Determinants of Health   Financial Resource Strain:   . Difficulty of Paying Living Expenses:   Food Insecurity:   . Worried About Charity fundraiser in the Last Year:   . Arboriculturist in the Last Year:   Transportation Needs:   . Film/video editor (Medical):   [name] Lack of Transportation (Non-Medical):   Physical Activity:   . Days of Exercise per Week:   . Minutes of Exercise per Session:   Stress:   . Feeling of Stress :   Social Connections:   . Frequency of Communication with Friends and Family:   . Frequency of Social Gatherings with Friends and Family:   . Attends Religious Services:   . Active Member of Clubs or Organizations:   . Attends Archivist Meetings:   [name] Marital Status:   Intimate Partner Violence:   . Fear of Current or Ex-Partner:   . Emotionally Abused:   [name] Physically Abused:   . Sexually Abused:    Family History:  Family History  Problem Relation Age of Onset  . Cancer Grandchild        lymphoma    Review of Systems: Constitutional: Doesn't report fevers, chills or abnormal weight loss Eyes: Doesn't report blurriness of vision Ears, nose, mouth, throat, and face: Doesn't report sore throat Respiratory: Doesn't report cough, dyspnea or wheezes Cardiovascular:  Doesn't report palpitation, chest discomfort  Gastrointestinal:  Doesn't report nausea, constipation, diarrhea GU: Doesn't report incontinence Skin: Doesn't report skin rashes Neurological: Per HPI Musculoskeletal: Doesn't report joint pain Behavioral/Psych: Doesn't report anxiety  Physical Exam:  08/20/19 07/13/19 06/04/19  BP 135/76 122/48Abnormal --  Pulse Rate 50Abnormal [Susan RN was aware] 52Abnormal --  Resp 18 17 --  Temp 97.6 F (36.4 C) 97.8 F (36.6 C) --  Temp Source Temporal Temporal --  SpO2 99 % 100 % --  Weight 245 lb 11.2 oz (111.4 kg) 244 lb 11.2 oz (111 kg) --  Height 5' 10" (1.778 m) 5' 10" (1.778 m) --  Pain Score 0 1 2    KPS: 90. General: Alert, cooperative, pleasant, in no acute distress Head: Normal EENT: No conjunctival injection or scleral icterus.  Lungs: Resp effort normal Cardiac: Regular rate Abdomen: Non-distended abdomen Skin: No rashes cyanosis or petechiae. Extremities: No clubbing or edema  Neurologic Exam: Mental Status: Awake, alert, attentive to examiner. Oriented to self and environment. Language is fluent with intact comprehension.  Cranial Nerves: Visual acuity is grossly normal. Visual fields are full. Extra-ocular movements intact. No ptosis. Face is symmetric Motor: Tone and bulk are normal. Power is full in both arms and legs. Reflexes are symmetric, no pathologic reflexes present.  Sensory: Intact to light touch Gait: Normal.   Labs: I have reviewed the data as listed    Component Value Date/Time   NA 141 08/17/2019 1239   NA 140 02/21/2017 1128   K 4.5 08/17/2019 1239   K 4.3 02/21/2017 1128   CL 103 08/17/2019 1239   CO2 27 08/17/2019 1239   CO2 26 02/21/2017 1128   GLUCOSE 181 (H) 08/17/2019 1239   GLUCOSE 115 02/21/2017 1128   BUN 14 08/17/2019 1239   BUN 16.6 02/21/2017 1128   CREATININE 0.90 08/17/2019 1239   CREATININE 0.8 02/21/2017 1128   CALCIUM 9.6 08/17/2019 1239   CALCIUM 9.4 02/21/2017 1128    PROT 6.7 08/17/2019 1239  PROT 7.5 02/21/2017 1128   ALBUMIN 3.6 08/17/2019 1239   ALBUMIN 4.2 02/21/2017 1128   AST 17 08/17/2019 1239   AST 20 02/21/2017 1128   ALT 30 08/17/2019 1239   ALT 43 02/21/2017 1128   ALKPHOS 63 08/17/2019 1239   ALKPHOS 104 02/21/2017 1128   BILITOT 0.2 (L) 08/17/2019 1239   BILITOT 0.36 02/21/2017 1128   GFRNONAA >60 08/17/2019 1239   GFRAA >60 08/17/2019 1239   Lab Results  Component Value Date   WBC 6.5 08/17/2019   NEUTROABS 5.1 08/17/2019   HGB 13.5 08/17/2019   HCT 41.7 08/17/2019   MCV 92.1 08/17/2019   PLT 235 08/17/2019    Imaging:  CT Chest W Contrast  Result Date: 08/17/2019 CLINICAL DATA:  Follow-up metastatic renal cell carcinoma. Currently undergoing oral chemotherapy. Previous immunotherapy and radiation therapy. EXAM: CT CHEST, ABDOMEN, AND PELVIS WITH CONTRAST TECHNIQUE: Multidetector CT imaging of the chest, abdomen and pelvis was performed following the standard protocol during bolus administration of intravenous contrast. CONTRAST:  174m OMNIPAQUE IOHEXOL 300 MG/ML  SOLN COMPARISON:  04/09/2019 FINDINGS: CT CHEST FINDINGS Cardiovascular: No acute findings. Mediastinum/Lymph Nodes: No pathologically enlarged lymph nodes identified. Several thyroid nodules remain stable, largest in the lower left lobe measuring 2.0 cm. Lungs/Pleura: No pulmonary infiltrate or mass identified. Previously noted nodule in the right lower lobe has resolved since previous study. No effusion present. Musculoskeletal:  No suspicious bone lesions identified. CT ABDOMEN AND PELVIS FINDINGS Hepatobiliary: No masses identified. Pancreas: No mass or inflammatory changes. Stable small cyst in central left lobe. Tiny gallstones again seen as well as phrygian cap. No evidence of cholecystitis or biliary ductal dilatation. Spleen:  Within normal limits in size and appearance. Adrenals/Urinary tract: Stable mild hyperplasia of adrenal glands. The right kidney is normal in  appearance. A centrally necrotic mass in the upper pole of the left kidney measures 3.2 x 3.2 cm, without significant change in size or appearance since previous study. No evidence ureteral calculi or hydronephrosis. Stomach/Bowel: No evidence of obstruction, inflammatory process, or abnormal fluid collections. Colonic diverticulosis again noted, however there is no evidence of diverticulitis. Vascular/Lymphatic: No pathologically enlarged lymph nodes identified. No abdominal aortic aneurysm. Aortic atherosclerosis noted. Reproductive: Stable markedly enlarged prostate. Symmetric seminal vesicles. Other:  None. Musculoskeletal: Mixed lytic and sclerotic bone metastases are again seen involving the thoracic and lumbar spine, several right posterior ribs, and left acetabulum, without significant change. IMPRESSION: 1. Stable centrally necrotic mass in upper pole of left kidney. 2. Stable bone metastases. 3. No new or progressive metastatic disease identified within the chest, abdomen, or pelvis. 4. Stable thyroid nodules. 5. Cholelithiasis. No radiographic evidence of cholecystitis. 6. Colonic diverticulosis, without radiographic evidence of diverticulitis. 7. Stable markedly enlarged prostate. Aortic Atherosclerosis (ICD10-I70.0). Electronically Signed   By: JMarlaine HindM.D.   On: 08/17/2019 16:16   CT Abdomen Pelvis W Contrast  Result Date: 08/17/2019 CLINICAL DATA:  Follow-up metastatic renal cell carcinoma. Currently undergoing oral chemotherapy. Previous immunotherapy and radiation therapy. EXAM: CT CHEST, ABDOMEN, AND PELVIS WITH CONTRAST TECHNIQUE: Multidetector CT imaging of the chest, abdomen and pelvis was performed following the standard protocol during bolus administration of intravenous contrast. CONTRAST:  1059mOMNIPAQUE IOHEXOL 300 MG/ML  SOLN COMPARISON:  04/09/2019 FINDINGS: CT CHEST FINDINGS Cardiovascular: No acute findings. Mediastinum/Lymph Nodes: No pathologically enlarged lymph nodes  identified. Several thyroid nodules remain stable, largest in the lower left lobe measuring 2.0 cm. Lungs/Pleura: No pulmonary infiltrate or mass identified. Previously noted nodule in  the right lower lobe has resolved since previous study. No effusion present. Musculoskeletal:  No suspicious bone lesions identified. CT ABDOMEN AND PELVIS FINDINGS Hepatobiliary: No masses identified. Pancreas: No mass or inflammatory changes. Stable small cyst in central left lobe. Tiny gallstones again seen as well as phrygian cap. No evidence of cholecystitis or biliary ductal dilatation. Spleen:  Within normal limits in size and appearance. Adrenals/Urinary tract: Stable mild hyperplasia of adrenal glands. The right kidney is normal in appearance. A centrally necrotic mass in the upper pole of the left kidney measures 3.2 x 3.2 cm, without significant change in size or appearance since previous study. No evidence ureteral calculi or hydronephrosis. Stomach/Bowel: No evidence of obstruction, inflammatory process, or abnormal fluid collections. Colonic diverticulosis again noted, however there is no evidence of diverticulitis. Vascular/Lymphatic: No pathologically enlarged lymph nodes identified. No abdominal aortic aneurysm. Aortic atherosclerosis noted. Reproductive: Stable markedly enlarged prostate. Symmetric seminal vesicles. Other:  None. Musculoskeletal: Mixed lytic and sclerotic bone metastases are again seen involving the thoracic and lumbar spine, several right posterior ribs, and left acetabulum, without significant change. IMPRESSION: 1. Stable centrally necrotic mass in upper pole of left kidney. 2. Stable bone metastases. 3. No new or progressive metastatic disease identified within the chest, abdomen, or pelvis. 4. Stable thyroid nodules. 5. Cholelithiasis. No radiographic evidence of cholecystitis. 6. Colonic diverticulosis, without radiographic evidence of diverticulitis. 7. Stable markedly enlarged prostate.  Aortic Atherosclerosis (ICD10-I70.0). Electronically Signed   By: Marlaine Hind M.D.   On: 08/17/2019 16:16    [facility] Clinician Interpretation: I have personally reviewed the radiological images as listed.  My interpretation, in the context of the patient's clinical presentation, is stable disease   Assessment/Plan Brain metastases [facility]) [C79.31]  Carthel Eschol Auxier is clinically and radiographically stable today.    He should continue Keppra 549m BID for seizure prevention, now 6 months seizure free.  We counseled him on epilepsy safety today.  We ask that JWorthingtonreturn to clinic in 2 months following next brain MRI, or sooner as needed.  We spent twenty additional minutes teaching regarding the natural history, biology, and historical experience in the treatment of neurologic complications of cancer.   We appreciate the opportunity to participate in the care of JKinney   All questions were answered. The patient knows to call the clinic with any problems, questions or concerns. No barriers to learning were detected.  The total time spent in the encounter was 40 minutes and more than 50% was on counseling and review of test results   ZVentura Sellers MD Medical Director of Neuro-Oncology [facility] WHaliimaile06/24/21 3:10 PM

## 2019-08-20 NOTE — Progress Notes (Signed)
Phillip [name]   Diagnosis: Renal cell carcinoma  INTERVAL HISTORY:   Phillip [name] returns as scheduled.  He continues cabozantinib.  No seizures or recurrent neurologic symptoms.  Stable pain in the right leg.  He is not taking breakthrough pain medication.  His appetite is poor. He has noted a nodule over the right scalp for the past several months. Objective:  Vital signs in last 24 hours:  Blood pressure 135/76, pulse (!) 50, temperature 97.6 F (36.4 C), temperature source Temporal, resp. rate 18, height 5\' 10"  (1.778 m), weight 245 lb 11.2 oz (111.4 kg), SpO2 99 %.    Resp: Lungs clear bilaterally Cardio: Regular rate and rhythm GI: Nontender, no mass, no hepatosplenomegaly Vascular: No leg edema  Skin: 1 cm erythematous round firm nodule at the right parietal scalp.  The nodule appears mobile from the underlying skull, similar smaller lesion at the right abdomen  Portacath/PICC-without erythema  Lab Results:  Lab Results  Component Value Date   WBC 6.5 08/17/2019   HGB 13.5 08/17/2019   HCT 41.7 08/17/2019   MCV 92.1 08/17/2019   PLT 235 08/17/2019   NEUTROABS 5.1 08/17/2019    CMP  Lab Results  Component Value Date   NA 141 08/17/2019   K 4.5 08/17/2019   CL 103 08/17/2019   CO2 27 08/17/2019   GLUCOSE 181 (H) 08/17/2019   BUN 14 08/17/2019   CREATININE 0.90 08/17/2019   CALCIUM 9.6 08/17/2019   PROT 6.7 08/17/2019   ALBUMIN 3.6 08/17/2019   AST 17 08/17/2019   ALT 30 08/17/2019   ALKPHOS 63 08/17/2019   BILITOT 0.2 (L) 08/17/2019   GFRNONAA >60 08/17/2019   GFRAA >60 08/17/2019    Lab Results  Component Value Date   CEA1 1.77 01/04/2016      Imaging:  CT Chest W Contrast  Result Date: 08/17/2019 CLINICAL DATA:  Follow-up metastatic renal cell carcinoma. Currently undergoing oral chemotherapy. Previous immunotherapy and radiation therapy. EXAM: CT CHEST, ABDOMEN, AND PELVIS WITH CONTRAST TECHNIQUE:  Multidetector CT imaging of the chest, abdomen and pelvis was performed following the standard protocol during bolus administration of intravenous contrast. CONTRAST:  175mL OMNIPAQUE IOHEXOL 300 MG/ML  SOLN COMPARISON:  04/09/2019 FINDINGS: CT CHEST FINDINGS Cardiovascular: No acute findings. Mediastinum/Lymph Nodes: No pathologically enlarged lymph nodes identified. Several thyroid nodules remain stable, largest in the lower left lobe measuring 2.0 cm. Lungs/Pleura: No pulmonary infiltrate or mass identified. Previously noted nodule in the right lower lobe has resolved since previous study. No effusion present. Musculoskeletal:  No suspicious bone lesions identified. CT ABDOMEN AND PELVIS FINDINGS Hepatobiliary: No masses identified. Pancreas: No mass or inflammatory changes. Stable small cyst in central left lobe. Tiny gallstones again seen as well as phrygian cap. No evidence of cholecystitis or biliary ductal dilatation. Spleen:  Within normal limits in size and appearance. Adrenals/Urinary tract: Stable mild hyperplasia of adrenal glands. The right kidney is normal in appearance. A centrally necrotic mass in the upper pole of the left kidney measures 3.2 x 3.2 cm, without significant change in size or appearance since previous study. No evidence ureteral calculi or hydronephrosis. Stomach/Bowel: No evidence of obstruction, inflammatory process, or abnormal fluid collections. Colonic diverticulosis again noted, however there is no evidence of diverticulitis. Vascular/Lymphatic: No pathologically enlarged lymph nodes identified. No abdominal aortic aneurysm. Aortic atherosclerosis noted. Reproductive: Stable markedly enlarged prostate. Symmetric seminal vesicles. Other:  None. Musculoskeletal: Mixed lytic and sclerotic bone metastases are again seen involving the  thoracic and lumbar spine, several right posterior ribs, and left acetabulum, without significant change. IMPRESSION: 1. Stable centrally necrotic mass  in upper pole of left kidney. 2. Stable bone metastases. 3. No new or progressive metastatic disease identified within the chest, abdomen, or pelvis. 4. Stable thyroid nodules. 5. Cholelithiasis. No radiographic evidence of cholecystitis. 6. Colonic diverticulosis, without radiographic evidence of diverticulitis. 7. Stable markedly enlarged prostate. Aortic Atherosclerosis (ICD10-I70.0). Electronically Signed   By: Marlaine Hind M.D.   On: 08/17/2019 16:16   CT Abdomen Pelvis W Contrast  Result Date: 08/17/2019 CLINICAL DATA:  Follow-up metastatic renal cell carcinoma. Currently undergoing oral chemotherapy. Previous immunotherapy and radiation therapy. EXAM: CT CHEST, ABDOMEN, AND PELVIS WITH CONTRAST TECHNIQUE: Multidetector CT imaging of the chest, abdomen and pelvis was performed following the standard protocol during bolus administration of intravenous contrast. CONTRAST:  122mL OMNIPAQUE IOHEXOL 300 MG/ML  SOLN COMPARISON:  04/09/2019 FINDINGS: CT CHEST FINDINGS Cardiovascular: No acute findings. Mediastinum/Lymph Nodes: No pathologically enlarged lymph nodes identified. Several thyroid nodules remain stable, largest in the lower left lobe measuring 2.0 cm. Lungs/Pleura: No pulmonary infiltrate or mass identified. Previously noted nodule in the right lower lobe has resolved since previous study. No effusion present. Musculoskeletal:  No suspicious bone lesions identified. CT ABDOMEN AND PELVIS FINDINGS Hepatobiliary: No masses identified. Pancreas: No mass or inflammatory changes. Stable small cyst in central left lobe. Tiny gallstones again seen as well as phrygian cap. No evidence of cholecystitis or biliary ductal dilatation. Spleen:  Within normal limits in size and appearance. Adrenals/Urinary tract: Stable mild hyperplasia of adrenal glands. The right kidney is normal in appearance. A centrally necrotic mass in the upper pole of the left kidney measures 3.2 x 3.2 cm, without significant change in size  or appearance since previous study. No evidence ureteral calculi or hydronephrosis. Stomach/Bowel: No evidence of obstruction, inflammatory process, or abnormal fluid collections. Colonic diverticulosis again noted, however there is no evidence of diverticulitis. Vascular/Lymphatic: No pathologically enlarged lymph nodes identified. No abdominal aortic aneurysm. Aortic atherosclerosis noted. Reproductive: Stable markedly enlarged prostate. Symmetric seminal vesicles. Other:  None. Musculoskeletal: Mixed lytic and sclerotic bone metastases are again seen involving the thoracic and lumbar spine, several right posterior ribs, and left acetabulum, without significant change. IMPRESSION: 1. Stable centrally necrotic mass in upper pole of left kidney. 2. Stable bone metastases. 3. No new or progressive metastatic disease identified within the chest, abdomen, or pelvis. 4. Stable thyroid nodules. 5. Cholelithiasis. No radiographic evidence of cholecystitis. 6. Colonic diverticulosis, without radiographic evidence of diverticulitis. 7. Stable markedly enlarged prostate. Aortic Atherosclerosis (ICD10-I70.0). Electronically Signed   By: Marlaine Hind M.D.   On: 08/17/2019 16:16    Medications: I have reviewed the patient's current medications.   Assessment/Plan: 1. Metastatic renal cell carcinoma   L4 mass with extraosseous extension, L4 nerve compression  Biopsy of the L4 mass 11/18/2015 confirmed metastatic renal cell carcinoma, clear cell type  CTs of the chest, abdomen, and pelvis 11/18/2015-right lower lobe nodule, expansile lytic lesion at the right 11th rib/costal vertebral junction, left renal mass, expansile lesion involving the L4 vertebra, lytic lesion at the left acetabulum, and a low-attenuation liver lesion  Initiation of SRS to L4 12/02/2015, Completed 12/12/2015  Initiation of Pazopanib11/04/2015  Pazopanibplaced on hold 02/06/2016 secondary to elevated liver enzymes  Pazopanibresumed  03/07/2016 at a dose of 400 mg daily  Pazopanibdiscontinued 03/19/2016 secondary to elevated liver enzymes  Restaging CTs 04/02/2016-stable left renal mass, decreased soft tissue component associated with the L4  metastasis, increased soft tissue component associated with the right 11th rib metastasis with increased T11 bony destruction, increased sclerosis at the left acetabulum lesion  Cycle 1 nivolumab 04/12/2016  Cycle 2 nivolumab 04/26/2016  Cycle 3 nivolumab03/16/2018  Cycle 4 nivolumab 05/24/2016  Cycle 5 nivolumab 06/08/2016  MRI lumbar spine 06/21/2016-unchanged tumor at L3, increased size of retroperitoneal lymph nodes compared to a CT from 04/02/2016  Cycle 6 nivolumab 06/22/2016  CTs chest, abdomen, and pelvis 07/04/2016-enlargement of the left renal mass, right adrenal nodule, left hilar and peritoneal lymph nodes, enlargement of left acetabular lesion. Stable lung nodules.  Cycle 7 nivolumab05/12/2016   Cycle 8 nivolumab 07/20/2016  Cycle 9 nivolumab 08/02/2016  Cycle 10 nivolumab 08/20/2016  Restaging CT 09/03/2016 evaluation with stable disease  Cycle 11 nivolumab 09/05/2016  Cycle 12 nivolumab 09/19/2016  Cycle 13 nivolumab 10/03/2016  Cycle 14 nivolumab 10/17/2016  Cycle 15 nivolumab 10/31/2016  Cycle 16 nivolumab 11/14/2016 (changed to monthly schedule)  Cycle 17nivolumab10/24/2018  CTs 01/21/2017-increased left renal mass, increased size of adrenal metastases, increased lytic bone lesions, increased left lung nodule, persistent tumor at L4 with probable epidural component  Initiation of Cabozantinib12/04/2016  Restaging CTs 05/30/2017-decreased size of left hilar mass, left renal mass, retroperitoneal adenopathy, and adrenal metastasis. Healing bone lesions.  Cabozantinib continued  CTs 10/21/2017-interval enlargement left hilar lymph node; stable rib lesions; stable mass left renal cortex; stable mildly nodular adrenal glands; stable  lytic lesions within the pelvis and spine.  Cabozantinib continued  CTs 02/21/2018-enlargement of an AP window lymph node. Mildly enlarged left hilar lymph node is unchanged. Primary renal cell carcinoma involving the upper pole of the left kidney appears similar. Stable enlarged right periaortic lymph node adjacent to the renal vessels. Stable multifocal bony metastatic disease.  Cabozantinibcontinued  CTs 07/29/2018-stable 15 mm AP window nodes; stable left hilar node; subcarinal node slightly larger; right periaortic node 16 mm, previously 12 mm; portacaval node 24 mm, previously 16 mm; 15 mm node superior to the pancreatic head has enlarged; interval increase nodularity of both adrenal glands; stable left kidney mass; multifocal bony metastatic disease not significantly changed.  Cabozantinib continued  CTs 11/06/2018-moderate improvement in thoracic adenopathy; minimal improvement abdominal adenopathy; left kidney upper pole mass and various lytic expansile bone lesions stable; mild increase in nodularity of left adrenal gland; right adrenal gland nodularity stable.  Cabozantinib continued  Clinical evidence of partial seizure activity December 2020  CT head 02/25/2019-extensive vasogenic edema, left greater than right. Peripheral enhancing masses consistent with metastases. No hemorrhage.  MRI brain 03/11/2019-7 enhancing brain masses consistent with metastatic disease  SRS to 7 brain lesions, treatment given 03/19/2019, 03/23/2019, and 03/25/2019  CTs 04/09/2019-decrease in left renal mass, bilateral adrenal nodules, AP window and porta hepatic adenopathy. New 9 mm right lower lobe nodule. Improved lytic lesion of the left acetabulum. Other bone lesions are stable.  Cabozantinib continued  CTs 08/17/2019-previously noted right lower lobe nodule resolved, stable left kidney mass, mixed lytic/sclerotic bone lesions in the thoracolumbar spine, right posterior ribs, and left  acetabulum-unchanged, no evidence of progressive disease 2. Pain secondary to #1-managed by Dr. Lovenia Shuck.Improved 3. Hypertension 3. Elevated transaminases 02/06/2016-Pazopanibplaced on hold  Liver enzymes normal 03/07/2016 5. Port-A-Cath placement 05/16/2016 6. Malaise/anorexia 09/05/2016. Cortisol and testosterone levels low. Hydrocortisone and testosterone replacement initiated. 7. Conjunctival/scleral erythema 09/19/2016-resolved with steroid eyedrops 8. 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 9. Hypercalcemia status post Zometa 01/23/2017-resolved 10. Pruritic rash following IV contrast 10/21/2017;rash following  IV contrast 02/21/2018 despite prednisone/Benadryl premedication 11. Brief episodes of expressive aphasia October and December 2020   Disposition: Phillip [name] appears stable.  The restaging CT showed no evidence of disease progression.  He will continue cabozantinib.  He will continue follow-up with neuro-oncology for follow-up of the brain metastases.  The nodular scalp lesion could represent a metastasis.  We will observe the lesion for now.  We will consider a dermatology referral if the lesion grows.  Phillip [name] will return for an office and lab visit in approximately 6 weeks.  Betsy Coder, MD  08/20/2019  2:10 PM

## 2019-08-21 ENCOUNTER — Telehealth: Payer: Self-pay | Admitting: Oncology

## 2019-08-21 ENCOUNTER — Telehealth: Payer: Self-pay | Admitting: Internal Medicine

## 2019-08-21 NOTE — Telephone Encounter (Signed)
scheduled per 6/24 los. Unable to reach pt. Left voicemail

## 2019-08-21 NOTE — Telephone Encounter (Signed)
Scheduled appts per 6/24 los. Pt confirmed appt date and time.  

## 2019-08-25 ENCOUNTER — Ambulatory Visit: Payer: PPO | Admitting: Internal Medicine

## 2019-08-26 ENCOUNTER — Other Ambulatory Visit: Payer: Self-pay | Admitting: Radiation Therapy

## 2019-08-26 NOTE — Addendum Note (Signed)
Addended by: Pincus Large on: 08/26/2019 04:20 PM   Modules accepted: Orders

## 2019-09-03 ENCOUNTER — Other Ambulatory Visit: Payer: Self-pay | Admitting: *Deleted

## 2019-09-03 DIAGNOSIS — C7931 Secondary malignant neoplasm of brain: Secondary | ICD-10-CM

## 2019-09-03 NOTE — Progress Notes (Signed)
Added orders for [facility] to access port for next imaging appointment.

## 2019-09-08 ENCOUNTER — Other Ambulatory Visit: Payer: Self-pay | Admitting: Oncology

## 2019-09-09 MED FILL — CABOMETYX 40 MG TABLET: 40 | 30 days supply | Qty: 30 | Fill #0

## 2019-09-11 DIAGNOSIS — M545 Low back pain: Secondary | ICD-10-CM | POA: Diagnosis not present

## 2019-09-11 DIAGNOSIS — Z6825 Body mass index (BMI) 25.0-25.9, adult: Secondary | ICD-10-CM | POA: Diagnosis not present

## 2019-09-11 DIAGNOSIS — C7951 Secondary malignant neoplasm of bone: Secondary | ICD-10-CM | POA: Diagnosis not present

## 2019-09-16 MED FILL — OxyCONTIN 10 MG T12A: 10 | 30 days supply | Qty: 120 | Fill #0

## 2019-09-21 ENCOUNTER — Other Ambulatory Visit: Payer: Self-pay | Admitting: Oncology

## 2019-09-21 ENCOUNTER — Other Ambulatory Visit: Payer: Self-pay

## 2019-09-21 DIAGNOSIS — C649 Malignant neoplasm of unspecified kidney, except renal pelvis: Secondary | ICD-10-CM

## 2019-09-21 MED FILL — levETIRAcetam 500 MG TABS: 500 | 30 days supply | Qty: 60 | Fill #1

## 2019-09-21 MED FILL — TESTOSTERONE 20.25 MG/ACT (: 20.25 MG/AC | 30 days supply | Qty: 75 | Fill #1

## 2019-09-21 MED FILL — LOSARTAN-HCTZ 100-12.5 MG T: 100-12.5 | 30 days supply | Qty: 30 | Fill #7

## 2019-09-21 MED FILL — HYDROCORTISONE 10 MG TABLET: 10 | 30 days supply | Qty: 90 | Fill #0

## 2019-09-21 NOTE — Progress Notes (Signed)
Medication refill completed as requested cortef

## 2019-10-06 ENCOUNTER — Other Ambulatory Visit: Payer: Self-pay

## 2019-10-06 ENCOUNTER — Inpatient Hospital Stay: Payer: PPO | Attending: Oncology | Admitting: Oncology

## 2019-10-06 ENCOUNTER — Inpatient Hospital Stay: Payer: PPO

## 2019-10-06 VITALS — BP 123/68 | HR 52 | Temp 93.5°F | Resp 18 | Ht 70.0 in | Wt 247.1 lb

## 2019-10-06 DIAGNOSIS — C649 Malignant neoplasm of unspecified kidney, except renal pelvis: Secondary | ICD-10-CM | POA: Diagnosis not present

## 2019-10-06 DIAGNOSIS — C7931 Secondary malignant neoplasm of brain: Secondary | ICD-10-CM | POA: Diagnosis present

## 2019-10-06 DIAGNOSIS — C7951 Secondary malignant neoplasm of bone: Secondary | ICD-10-CM | POA: Diagnosis not present

## 2019-10-06 DIAGNOSIS — C642 Malignant neoplasm of left kidney, except renal pelvis: Secondary | ICD-10-CM | POA: Diagnosis not present

## 2019-10-06 DIAGNOSIS — Z9221 Personal history of antineoplastic chemotherapy: Secondary | ICD-10-CM | POA: Insufficient documentation

## 2019-10-06 DIAGNOSIS — I10 Essential (primary) hypertension: Secondary | ICD-10-CM | POA: Insufficient documentation

## 2019-10-06 DIAGNOSIS — Z95828 Presence of other vascular implants and grafts: Secondary | ICD-10-CM

## 2019-10-06 LAB — CMP (CANCER CENTER ONLY)
ALT: 17 U/L (ref 0–44)
AST: 14 U/L — ABNORMAL LOW (ref 15–41)
Albumin: 3.1 g/dL — ABNORMAL LOW (ref 3.5–5.0)
Alkaline Phosphatase: 57 U/L (ref 38–126)
Anion gap: 5 (ref 5–15)
BUN: 15 mg/dL (ref 8–23)
CO2: 28 mmol/L (ref 22–32)
Calcium: 8.9 mg/dL (ref 8.9–10.3)
Chloride: 105 mmol/L (ref 98–111)
Creatinine: 0.78 mg/dL (ref 0.61–1.24)
GFR, Est AFR Am: >60 mL/min (ref >60)
GFR, Estimated: >60 mL/min (ref >60)
Glucose, Bld: 105 mg/dL — ABNORMAL HIGH (ref 70–99)
Potassium: 4.1 mmol/L (ref 3.5–5.1)
Sodium: 138 mmol/L (ref 135–145)
Total Bilirubin: 0.3 mg/dL (ref 0.3–1.2)
Total Protein: 5.8 g/dL — ABNORMAL LOW (ref 6.5–8.1)

## 2019-10-06 LAB — CBC WITH DIFFERENTIAL (CANCER CENTER ONLY)
Abs Immature Granulocytes: 0.01 10*3/uL (ref 0.00–0.07)
Basophils Absolute: 0.1 10*3/uL (ref 0.0–0.1)
Basophils Relative: 1 %
Eosinophils Absolute: 0.7 10*3/uL — ABNORMAL HIGH (ref 0.0–0.5)
Eosinophils Relative: 9 %
HCT: 39.3 % (ref 39.0–52.0)
Hemoglobin: 12.5 g/dL — ABNORMAL LOW (ref 13.0–17.0)
Immature Granulocytes: 0 %
Lymphocytes Relative: 33 %
Lymphs Abs: 2.8 10*3/uL (ref 0.7–4.0)
MCH: 28.9 pg (ref 26.0–34.0)
MCHC: 31.8 g/dL (ref 30.0–36.0)
MCV: 91 fL (ref 80.0–100.0)
Monocytes Absolute: 0.8 10*3/uL (ref 0.1–1.0)
Monocytes Relative: 10 %
Neutro Abs: 4 10*3/uL (ref 1.7–7.7)
Neutrophils Relative %: 47 %
Platelet Count: 214 10*3/uL (ref 150–400)
RBC: 4.32 MIL/uL (ref 4.22–5.81)
RDW: 15.5 % (ref 11.5–15.5)
WBC Count: 8.4 10*3/uL (ref 4.0–10.5)
nRBC: 0 % (ref 0.0–0.2)

## 2019-10-06 MED ORDER — SODIUM CHLORIDE 0.9% FLUSH
10.0000 mL | Freq: Once | INTRAVENOUS | Status: AC
  Administered 2019-10-06: 10 mL
  Filled 2019-10-06: qty 10

## 2019-10-06 MED ORDER — HEPARIN SOD (PORK) LOCK FLUSH 100 UNIT/ML IV SOLN
500.0000 [IU] | Freq: Once | INTRAVENOUS | Status: AC
  Administered 2019-10-06: 500 [IU]
  Filled 2019-10-06: qty 5

## 2019-10-06 NOTE — Progress Notes (Signed)
[facility] OFFICE PROGRESS NOTE   Diagnosis: Renal cell carcinoma  INTERVAL HISTORY:   Mr. Oakley returns as scheduled.  He continues cabozantinib.  No diarrhea.  He has an intermittent erythematous rash over the face.  This improves with a steroid cream.  He continues to have back and right leg pain.  He continues OxyContin and occasionally takes oxycodone for breakthrough pain.  No seizures.  The right scalp lesion is slightly enlarged.  No pain associated with this lesion.  Objective:  Vital signs in last 24 hours:  Blood pressure 123/68, pulse (!) 52, temperature (!) 93.5 F (34.2 C), temperature source Tympanic, resp. rate 18, height 5\' 10"  (1.778 m), weight 247 lb 1.6 oz (112.1 kg), SpO2 100 %.    Resp: Lungs clear bilaterally with diminished breath sounds at the left upper posterior chest, no respiratory distress Cardio: Regular rate and rhythm GI: No hepatomegaly, nontender Vascular: No leg edema Neuro: The motor exam is intact in the legs bilaterally Skin: Dryness and mild erythema over the face.  No rash over the trunk.  1 cm erythematous round raised lesion at the right parietal scalp  Portacath/PICC-without erythema  Lab Results:  Lab Results  Component Value Date   WBC 8.4 10/06/2019   HGB 12.5 (L) 10/06/2019   HCT 39.3 10/06/2019   MCV 91.0 10/06/2019   PLT 214 10/06/2019   NEUTROABS 4.0 10/06/2019    CMP  Lab Results  Component Value Date   NA 138 10/06/2019   K 4.1 10/06/2019   CL 105 10/06/2019   CO2 28 10/06/2019   GLUCOSE 105 (H) 10/06/2019   BUN 15 10/06/2019   CREATININE 0.78 10/06/2019   CALCIUM 8.9 10/06/2019   PROT 5.8 (L) 10/06/2019   ALBUMIN 3.1 (L) 10/06/2019   AST 14 (L) 10/06/2019   ALT 17 10/06/2019   ALKPHOS 57 10/06/2019   BILITOT 0.3 10/06/2019   GFRNONAA >60 10/06/2019   GFRAA >60 10/06/2019    Lab Results  Component Value Date   CEA1 1.77 01/04/2016    Medications: I have reviewed the patient's  current medications.   Assessment/Plan: 1. Metastatic renal cell carcinoma   L4 mass with extraosseous extension, L4 nerve compression  Biopsy of the L4 mass 11/18/2015 confirmed metastatic renal cell carcinoma, clear cell type  CTs of the chest, abdomen, and pelvis 11/18/2015-right lower lobe nodule, expansile lytic lesion at the right 11th rib/costal vertebral junction, left renal mass, expansile lesion involving the L4 vertebra, lytic lesion at the left acetabulum, and a low-attenuation liver lesion  Initiation of SRS to L4 12/02/2015, Completed 12/12/2015  Initiation of Pazopanib11/04/2015  Pazopanibplaced on hold 02/06/2016 secondary to elevated liver enzymes  Pazopanibresumed 03/07/2016 at a dose of 400 mg daily  Pazopanibdiscontinued 03/19/2016 secondary to elevated liver enzymes  Restaging CTs 04/02/2016-stable left renal mass, decreased soft tissue component associated with the L4 metastasis, increased soft tissue component associated with the right 11th rib metastasis with increased T11 bony destruction, increased sclerosis at the left acetabulum lesion  Cycle 1 nivolumab 04/12/2016  Cycle 2 nivolumab 04/26/2016  Cycle 3 nivolumab03/16/2018  Cycle 4 nivolumab 05/24/2016  Cycle 5 nivolumab 06/08/2016  MRI lumbar spine 06/21/2016-unchanged tumor at L3, increased size of retroperitoneal lymph nodes compared to a CT from 04/02/2016  Cycle 6 nivolumab 06/22/2016  CTs chest, abdomen, and pelvis 07/04/2016-enlargement of the left renal mass, right adrenal nodule, left hilar and peritoneal lymph nodes, enlargement of left acetabular lesion. Stable lung nodules.  Cycle 7 nivolumab05/12/2016  Cycle 8 nivolumab 07/20/2016  Cycle 9 nivolumab 08/02/2016  Cycle 10 nivolumab 08/20/2016  Restaging CT 09/03/2016 evaluation with stable disease  Cycle 11 nivolumab 09/05/2016  Cycle 12 nivolumab 09/19/2016  Cycle 13 nivolumab 10/03/2016  Cycle 14 nivolumab  10/17/2016  Cycle 15 nivolumab 10/31/2016  Cycle 16 nivolumab 11/14/2016 (changed to monthly schedule)  Cycle 17nivolumab10/24/2018  CTs 01/21/2017-increased left renal mass, increased size of adrenal metastases, increased lytic bone lesions, increased left lung nodule, persistent tumor at L4 with probable epidural component  Initiation of Cabozantinib12/04/2016  Restaging CTs 05/30/2017-decreased size of left hilar mass, left renal mass, retroperitoneal adenopathy, and adrenal metastasis. Healing bone lesions.  Cabozantinib continued  CTs 10/21/2017-interval enlargement left hilar lymph node; stable rib lesions; stable mass left renal cortex; stable mildly nodular adrenal glands; stable lytic lesions within the pelvis and spine.  Cabozantinib continued  CTs 02/21/2018-enlargement of an AP window lymph node. Mildly enlarged left hilar lymph node is unchanged. Primary renal cell carcinoma involving the upper pole of the left kidney appears similar. Stable enlarged right periaortic lymph node adjacent to the renal vessels. Stable multifocal bony metastatic disease.  Cabozantinibcontinued  CTs 07/29/2018-stable 15 mm AP window nodes; stable left hilar node; subcarinal node slightly larger; right periaortic node 16 mm, previously 12 mm; portacaval node 24 mm, previously 16 mm; 15 mm node superior to the pancreatic head has enlarged; interval increase nodularity of both adrenal glands; stable left kidney mass; multifocal bony metastatic disease not significantly changed.  Cabozantinib continued  CTs 11/06/2018-moderate improvement in thoracic adenopathy; minimal improvement abdominal adenopathy; left kidney upper pole mass and various lytic expansile bone lesions stable; mild increase in nodularity of left adrenal gland; right adrenal gland nodularity stable.  Cabozantinib continued  Clinical evidence of partial seizure activity December 2020  CT head 02/25/2019-extensive  vasogenic edema, left greater than right. Peripheral enhancing masses consistent with metastases. No hemorrhage.  MRI brain 03/11/2019-7 enhancing brain masses consistent with metastatic disease  SRS to 7 brain lesions, treatment given 03/19/2019, 03/23/2019, and 03/25/2019  CTs 04/09/2019-decrease in left renal mass, bilateral adrenal nodules, AP window and porta hepatic adenopathy. New 9 mm right lower lobe nodule. Improved lytic lesion of the left acetabulum. Other bone lesions are stable.  Cabozantinib continued  MRI brain 07/17/2019-resolution of 4 mm treated lesion in the right frontal cortex, 6 remaining treated lesions have decreased in size, new punctate metastasis in the superior right cerebellum  SRS to right cerebellar lesion 07/30/2019  CTs 08/17/2019-previously noted right lower lobe nodule resolved, stable left kidney mass, mixed lytic/sclerotic bone lesions in the thoracolumbar spine, right posterior ribs, and left acetabulum-unchanged, no evidence of progressive disease   Cabozantinib continued 2. Pain secondary to #1-managed by Dr. Lovenia Shuck.Improved 3. Hypertension 3. Elevated transaminases 02/06/2016-Pazopanibplaced on hold  Liver enzymes normal 03/07/2016 5. Port-A-Cath placement 05/16/2016 6. Malaise/anorexia 09/05/2016. Cortisol and testosterone levels low. Hydrocortisone and testosterone replacement initiated. 7. Conjunctival/scleral erythema 09/19/2016-resolved with steroid eyedrops 8. 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 9. Hypercalcemia status post Zometa 01/23/2017-resolved 10. Pruritic rash following IV contrast 10/21/2017;rash following IV contrast 02/21/2018 despite prednisone/Benadryl premedication 11. Brief episodes of expressive aphasia October and December 2020   Disposition: Mr. Duffner appears unchanged.  He will continue cabozantinib.  The lesion at the right parietal scalp is likely a  metastasis.  We will ask dermatology to biopsy or remove the lesion if it grows further or bleeds.  We can consider palliative radiation to this lesion.  Mr. Dillenbeck will return for an office visit in approximately 1 month.  We will plan for a restaging CT evaluation in October or November.  He is scheduled for a follow-up brain MRI in 2 weeks.  Betsy Coder, MD  10/06/2019  10:43 AM

## 2019-10-12 MED FILL — CABOMETYX 40 MG TABLET: 40 | 30 days supply | Qty: 30 | Fill #1

## 2019-10-19 ENCOUNTER — Other Ambulatory Visit: Payer: Self-pay | Admitting: Oncology

## 2019-10-19 MED FILL — MIRTAZAPINE 30 MG TABLET: 30 | 90 days supply | Qty: 90 | Fill #1

## 2019-10-19 MED FILL — LOSARTAN-HCTZ 100-12.5 MG T: 100-12.5 | 30 days supply | Qty: 30 | Fill #8

## 2019-10-19 MED FILL — HYDROCORTISONE 10 MG TABLET: 10 | 30 days supply | Qty: 90 | Fill #1

## 2019-10-19 MED FILL — levETIRAcetam 500 MG TABS: 500 | 30 days supply | Qty: 60 | Fill #0

## 2019-10-19 MED FILL — OxyCONTIN 10 MG T12A: 10 | 30 days supply | Qty: 120 | Fill #0

## 2019-10-19 MED FILL — TESTOSTERONE 1.62 % GEL: 1.62 | 30 days supply | Qty: 75 | Fill #2

## 2019-10-23 ENCOUNTER — Ambulatory Visit
Admission: RE | Admit: 2019-10-23 | Discharge: 2019-10-23 | Disposition: A | Discharge status: Home / Self Care | Payer: PPO | Source: Ambulatory Visit | Attending: Internal Medicine | Admitting: Internal Medicine

## 2019-10-23 ENCOUNTER — Other Ambulatory Visit: Payer: Self-pay

## 2019-10-23 DIAGNOSIS — I616 Nontraumatic intracerebral hemorrhage, multiple localized: Secondary | ICD-10-CM | POA: Diagnosis not present

## 2019-10-23 DIAGNOSIS — G9389 Other specified disorders of brain: Secondary | ICD-10-CM | POA: Diagnosis not present

## 2019-10-23 DIAGNOSIS — C7931 Secondary malignant neoplasm of brain: Secondary | ICD-10-CM

## 2019-10-23 DIAGNOSIS — G936 Cerebral edema: Secondary | ICD-10-CM | POA: Diagnosis not present

## 2019-10-23 MED ORDER — HEPARIN SOD (PORK) LOCK FLUSH 100 UNIT/ML IV SOLN
500.0000 [IU] | Freq: Once | INTRAVENOUS | Status: AC
  Administered 2019-10-23: 500 [IU] via INTRAVENOUS

## 2019-10-23 MED ORDER — GADOBENATE DIMEGLUMINE 529 MG/ML IV SOLN
20.0000 mL | Freq: Once | INTRAVENOUS | Status: AC | PRN
  Administered 2019-10-23: 20 mL via INTRAVENOUS

## 2019-10-23 MED ORDER — SODIUM CHLORIDE 0.9% FLUSH: 10.0000 mL | INTRAVENOUS | Status: DC | PRN

## 2019-10-26 ENCOUNTER — Inpatient Hospital Stay: Payer: PPO

## 2019-10-26 ENCOUNTER — Telehealth: Payer: Self-pay

## 2019-10-26 NOTE — Telephone Encounter (Signed)
Spoke with pt's partner, [name], about COVID-19 booster as requested. Pt will get COVID-19 booster at next appt with MD [name]. Pt will call [place] with any concerns/questions.

## 2019-10-29 ENCOUNTER — Inpatient Hospital Stay: Payer: PPO | Attending: Oncology | Admitting: Internal Medicine

## 2019-10-29 ENCOUNTER — Other Ambulatory Visit: Payer: Self-pay

## 2019-10-29 VITALS — BP 144/53 | HR 98 | Temp 98.2°F | Resp 20 | Ht 70.0 in | Wt 247.5 lb

## 2019-10-29 DIAGNOSIS — Z23 Encounter for immunization: Secondary | ICD-10-CM | POA: Insufficient documentation

## 2019-10-29 DIAGNOSIS — C7951 Secondary malignant neoplasm of bone: Secondary | ICD-10-CM | POA: Diagnosis not present

## 2019-10-29 DIAGNOSIS — R569 Unspecified convulsions: Secondary | ICD-10-CM

## 2019-10-29 DIAGNOSIS — Z923 Personal history of irradiation: Secondary | ICD-10-CM | POA: Insufficient documentation

## 2019-10-29 DIAGNOSIS — C7931 Secondary malignant neoplasm of brain: Secondary | ICD-10-CM

## 2019-10-29 DIAGNOSIS — C642 Malignant neoplasm of left kidney, except renal pelvis: Secondary | ICD-10-CM | POA: Insufficient documentation

## 2019-10-29 DIAGNOSIS — I10 Essential (primary) hypertension: Secondary | ICD-10-CM | POA: Diagnosis not present

## 2019-10-29 NOTE — Progress Notes (Signed)
[facility] [place], [place] [address] [phone]   Interval Evaluation  Date of Service: 10/29/19 Patient Name: Phillip [name] Patient MRN: [id number] Patient DOB: [birth year] Provider: [name], MD  Identifying Statement:  [name] is a 74 y.o. male with Brain metastases [facility]) [C79.31]   Primary Cancer: Renal Cell Carcinoma  CNS Oncologic History: 03/19/19-03/25/19 SRS Treatment: PTV1 Lt Temporal 39m 27 Gy in 3 fractions PTV2 Lt Occipital 134m 27 Gy in 3 fractions PTV6 Lt Frontal 2070m27 Gy in 3 fractions  03/19/19 SRS Treatment:  PTV3 Rt Occipital 78m70mTV4 Lt Frontal 12mm44mTV5 Rt Frontal 4mm  18m7 Lt Parietal 9mm  A24mof these were treated to 20 Gy in 1 fraction  12/02/2015 to 12/12/2015 SBRT Treatment:  The L4 Right spinal was treated to 50 Gy in 5 fractions at 10 Gy per fraction  Interval History:  Phillip [name] today for follow up after recent brain MRI.  No new or progressive neurologic deficits. Remains active at home and at church, though little outside activity still because of COVID.  Takes cabometyx for metastatic renal cell carcinoma.   Medications: Current Outpatient Medications on File Prior to Visit  Medication Sig Dispense Refill  . acetaminophen (TYLENOL) 650 MG CR tablet Take 650 mg by mouth every 8 (eight) hours as needed for pain.    . CABOMMarland KitchenTYX 40 MG tablet TAKE 1 TABLET (40 MG TOTAL) BY MOUTH DAILY. TAKE 1 HOUR BEFORE OR 2 HOURS AFTER MEALS. 30 tablet 2  . diphenhydrAMINE (BENADRYL) 50 MG tablet Take 1 tablet (50 mg total) by mouth as directed. Take Benadryl 50 mg by mouth 1 hour before CT scan. (Patient not taking: Reported on 08/20/2019) 1 tablet 0  . docusate sodium (COLACE) 100 MG capsule Take 100 mg by mouth 2 (two) times daily.    . hydrocortisone (CORTEF) 10 MG tablet TAKE 2 TABLETS BY MOUTH IN THE MORNING AND 1 TABLET IN THE EVENING 90 tablet 3  .  ibuprofen (ADVIL,MOTRIN) 200 MG tablet Take 200 mg by mouth every 8 (eight) hours as needed for fever, headache, mild pain, moderate pain or cramping.     . levETIRAcetam (KEPPRA) 500 MG tablet TAKE 1 TABLET BY MOUTH TWICE DAILY 60 tablet 1  . lidocaine-prilocaine (EMLA) cream APPLY TO PORTACATH 1 HOUR PRIOR TO USE AS NEEDED 30 g 2  . LORazepam (ATIVAN) 0.5 MG tablet Take 1-2 tablets (0.5-1 mg total) by mouth every 6 (six) hours as needed for anxiety. (Patient not taking: Reported on 08/20/2019) 60 tablet 2  . LORazepam (ATIVAN) 1 MG tablet Take 1 tablet (1 mg total) by mouth as directed. Take 1 hour prior to MRI (Patient not taking: Reported on 08/20/2019) 1 tablet 0  . losartan-hydrochlorothiazide (HYZAAR) 100-12.5 MG tablet Take 1 tablet by mouth daily.     . mirtazapine (REMERON) 30 MG tablet Take 1 tablet (30 mg total) by mouth at bedtime. 90 tablet 1  . Multiple Vitamin (MULTIVITAMIN ADULT PO) Take 1 tablet by mouth daily. [place] product    . ondansetron (ZOFRAN) 8 MG tablet TAKE 1 TABLET BY MOUTH EVERY 8 HOURS AS NEEDED FOR NAUSEA AND VOMITING 90 tablet 2  . oxyCODONE (OXYCONTIN) 10 mg 12 hr tablet Take 20 mg by mouth every morning.     . OXYCONTIN 10 MG 12 hr tablet Take 20 mg by mouth at bedtime.  0  .  polyethylene glycol (MIRALAX / GLYCOLAX) 17 g packet Take 17 g by mouth daily.    . predniSONE (DELTASONE) 50 MG tablet Take 50 mg 13 hours, 7 hours and 1 hour prior to CT scan. Hold decadron on days taking prednisone (Patient not taking: Reported on 08/20/2019) 3 tablet 3  . Testosterone 20.25 MG/ACT (1.62%) GEL APPLY 2 PUMPS ONCE A DAY AS DIRECTED 75 g 3   Current Facility-Administered Medications on File Prior to Visit  Medication Dose Route Frequency Provider Last Rate Last Admin  . sodium chloride flush (NS) 0.9 % injection 10 mL  10 mL Intravenous PRN [name], MD   10 mL at 06/22/16 4332    Allergies:  Allergies  Allergen Reactions  . Contrast Media [Iodinated [employer]  [employer]] Rash   Past Medical History:  Past Medical History:  Diagnosis Date  . Anxiety   . Brain cancer ([place])   . Chronic kidney disease    renal cancer  . Constipation   . Depression   . Hypertension   . Low testosterone in male   . 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 Seizures ([place])    last seizure 01/2019 - controlled since on keppra  . Wears glasses    Past Surgical History:  Past Surgical History:  Procedure Laterality Date  . insertion port-a-cath  2018  . IR GENERIC HISTORICAL  11/18/2015   IR FLUORO GUIDED NEEDLE PLC ASPIRATION/INJECTION LOC 11/18/2015 [name], MD [facility]-INTERV RAD  . IR GENERIC HISTORICAL  05/16/2016   IR FLUORO GUIDE PORT INSERTION RIGHT 05/16/2016 [name], MD [facility]-INTERV RAD  . IR GENERIC HISTORICAL  05/16/2016   IR US GUIDE VASC ACCESS RIGHT 05/16/2016 [name], MD [facility]-INTERV RAD  . RADIOLOGY WITH ANESTHESIA N/A 06/16/2019   Procedure: MRI BRAIN WITH AND WITHOUT CONTRAST WITH ANESTHESIA;  Surgeon: Radiologist, Medication, MD;  Location: [facility];  Service: Radiology;  Laterality: N/A;   Social History:  Social History   Socioeconomic History  . Marital status: Single    Spouse name: Not on file  . Number of children: Not on file  . Years of education: Not on file  . Highest education level: Not on file  Occupational History  . Not on file  Tobacco Use  . Smoking status: Never Smoker  . Smokeless tobacco: Never Used  Vaping Use  . Vaping Use: Never used  Substance and Sexual Activity  . Alcohol use: Not Currently    Alcohol/week: 1.0 standard drink    Types: 1 Glasses of wine per week  . Drug use: Yes    Types: Marijuana    Comment: Last use 06/13/19  . Sexual activity: Not Currently  Other Topics Concern  . Not on file  Social History Narrative  . Not on file   Social Determinants of Health   Financial Resource Strain:   . Difficulty of Paying Living Expenses: Not on file  Food Insecurity:   . Worried  About Charity fundraiser in the Last Year: Not on file  . Ran Out of Food in the Last Year: Not on file  Transportation Needs:   . Lack of Transportation (Medical): Not on file  . Lack of Transportation (Non-Medical): Not on file  Physical Activity:   . Days of Exercise per Week: Not on file  . Minutes of Exercise per Session: Not on file  Stress:   . Feeling of Stress : Not on file  Social Connections:   .  Frequency of Communication with Friends and Family: Not on file  . Frequency of Social Gatherings with Friends and Family: Not on file  . Attends Religious Services: Not on file  . Active Member of Clubs or Organizations: Not on file  . Attends Archivist Meetings: Not on file  . Marital Status: Not on file  Intimate Partner Violence:   . Fear of Current or Ex-Partner: Not on file  . Emotionally Abused: Not on file  . Physically Abused: Not on file  . Sexually Abused: Not on file   Family History:  Family History  Problem Relation Age of Onset  . Cancer Grandchild        lymphoma    Review of Systems: Constitutional: Doesn't report fevers, chills or abnormal weight loss Eyes: Doesn't report blurriness of vision Ears, nose, mouth, throat, and face: Doesn't report sore throat Respiratory: Doesn't report cough, dyspnea or wheezes Cardiovascular: Doesn't report palpitation, chest discomfort  Gastrointestinal:  Doesn't report nausea, constipation, diarrhea GU: Doesn't report incontinence Skin: Doesn't report skin rashes Neurological: Per HPI Musculoskeletal: Doesn't report joint pain Behavioral/Psych: Doesn't report anxiety  Physical Exam: Vitals:   10/29/19 0936  BP: (!) 144/53  Pulse: 98  Resp: 20  Temp: 98.2 F (36.8 C)  SpO2: 95%   KPS: 90. General: Alert, cooperative, pleasant, in no acute distress Head: Normal EENT: No conjunctival injection or scleral icterus.  Lungs: Resp effort normal Cardiac: Regular rate Abdomen: Non-distended  abdomen Skin: No rashes cyanosis or petechiae. Extremities: No clubbing or edema  Neurologic Exam: Mental Status: Awake, alert, attentive to examiner. Oriented to self and environment. Language is fluent with intact comprehension.  Cranial Nerves: Visual acuity is grossly normal. Visual fields are full. Extra-ocular movements intact. No ptosis. Face is symmetric Motor: Tone and bulk are normal. Power is full in both arms and legs. Reflexes are symmetric, no pathologic reflexes present.  Sensory: Intact to light touch Gait: Normal.   Labs: I have reviewed the data as listed    Component Value Date/Time   NA 138 10/06/2019 0955   NA 140 02/21/2017 1128   K 4.1 10/06/2019 0955   K 4.3 02/21/2017 1128   CL 105 10/06/2019 0955   CO2 28 10/06/2019 0955   CO2 26 02/21/2017 1128   GLUCOSE 105 (H) 10/06/2019 0955   GLUCOSE 115 02/21/2017 1128   BUN 15 10/06/2019 0955   BUN 16.6 02/21/2017 1128   CREATININE 0.78 10/06/2019 0955   CREATININE 0.8 02/21/2017 1128   CALCIUM 8.9 10/06/2019 0955   CALCIUM 9.4 02/21/2017 1128   PROT 5.8 (L) 10/06/2019 0955   PROT 7.5 02/21/2017 1128   ALBUMIN 3.1 (L) 10/06/2019 0955   ALBUMIN 4.2 02/21/2017 1128   AST 14 (L) 10/06/2019 0955   AST 20 02/21/2017 1128   ALT 17 10/06/2019 0955   ALT 43 02/21/2017 1128   ALKPHOS 57 10/06/2019 0955   ALKPHOS 104 02/21/2017 1128   BILITOT 0.3 10/06/2019 0955   BILITOT 0.36 02/21/2017 1128   GFRNONAA >60 10/06/2019 0955   GFRAA >60 10/06/2019 0955   Lab Results  Component Value Date   WBC 8.4 10/06/2019   NEUTROABS 4.0 10/06/2019   HGB 12.5 (L) 10/06/2019   HCT 39.3 10/06/2019   MCV 91.0 10/06/2019   PLT 214 10/06/2019    Imaging:  MR BRAIN W WO CONTRAST  Result Date: 10/23/2019 CLINICAL DATA:  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 Treated metastases. Personal history of renal cell carcinoma. The  patient's Port-A-Cath was accessed by the nursing staff at [facility] at 2:10 p.m. EXAM:  MRI HEAD WITHOUT AND WITH CONTRAST TECHNIQUE: Multiplanar, multiecho pulse sequences of the brain and surrounding structures were obtained without and with intravenous contrast. CONTRAST:  60m MULTIHANCE GADOBENATE DIMEGLUMINE 529 MG/ML IV SOLN COMPARISON:  MRI of the head without and with contrast 07/17/2019 FINDINGS: Brain: Multiple enhancing intracranial lesions are again seen. Lesion in the left frontal lobe now measures 4.5 x 10.0 mm stable to slightly decreased from the prior exam. Remote blood products are again noted. Lesion in the medial left frontal lobe measures 10.4 x 8.6 mm on image 119 is stable in size. Surrounding edema has increased slightly. The posterior left parietal lesion is also stable to slightly decreased, measuring 6.5 x 7 mm on image 103. 9 mm lesion in the left occipital lobe on image 52 is stable. Minimal edema. 5 mm lesion in the right occipital lobe on image 63 is stable to slightly decreased, also stable. A punctate lesion is present in the posterior right cerebellum. Edema has decreased. There is a remote hemorrhage encephalomalacia are present in the anterior left temporal lobe without significant residual enhancement. No new lesions are present. 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 No acute infarct or acute hemorrhage is present. Vascular: Flow is present in the major intracranial arteries. Skull and upper cervical spine: The craniocervical junction is normal. Upper cervical spine is within normal limits. Marrow signal is unremarkable. Marrow signal is increased, following radiation. Sinuses/Orbits: Mild mucosal thickening is present ethmoid air cells and inferior maxillary sinuses. Globes and orbits are otherwise within normal limits. Mastoid air cells are within normal limits. IMPRESSION: 1. Stable to slight decrease in size of multiple enhancing intracranial lesions compatible with metastatic disease. 2. Slight increase in surrounding edema in the medial  left frontal lobe is likely related to radiation therapy. 3. No new lesions are present. 4. Stable encephalomalacia and hemorrhage of the anterior left temporal lobe. No residual enhancement is present. 5. Stable remote lacunar infarct of the left corona radiata. Electronically Signed   By: CSan MorelleM.D.   On: 10/23/2019 16:25    CAlleganClinician Interpretation: I have personally reviewed the radiological images as listed.  My interpretation, in the context of the patient's clinical presentation, is stable disease   Assessment/Plan Brain metastases ([facility] [C79.31]  Bryton LRanden Kauthis clinically and radiographically stable today.  Continues to demonstrate good cognitive and motor function.  He should continue Keppra 5018mBID for seizure prevention, now 6 months seizure free.    We ask that JiBeavereturn to clinic in 3 months following next brain MRI, or sooner as needed.  We appreciate the opportunity to participate in the care of JiGroveland  All questions were answered. The patient knows to call the clinic with any problems, questions or concerns. No barriers to learning were detected.  I have spent a total of 30 minutes of face-to-face and non-face-to-face time, excluding clinical staff time, preparing to see patient, ordering tests and/or medications, counseling the patient, and independently interpreting results and communicating results to the patient/family/caregiver    ZaVentura SellersMD Medical Director of Neuro-Oncology [facility] WeCl[facility]9/02/21 10:01 AM

## 2019-11-05 ENCOUNTER — Other Ambulatory Visit: Payer: Self-pay | Admitting: Radiation Therapy

## 2019-11-05 ENCOUNTER — Inpatient Hospital Stay: Payer: PPO

## 2019-11-05 ENCOUNTER — Other Ambulatory Visit: Payer: Self-pay

## 2019-11-05 ENCOUNTER — Inpatient Hospital Stay: Payer: PPO | Admitting: Nurse Practitioner

## 2019-11-05 ENCOUNTER — Encounter: Payer: Self-pay | Admitting: Nurse Practitioner

## 2019-11-05 ENCOUNTER — Other Ambulatory Visit: Payer: Self-pay | Admitting: *Deleted

## 2019-11-05 VITALS — BP 130/56 | HR 62 | Temp 97.6°F | Resp 17 | Ht 70.0 in | Wt 244.0 lb

## 2019-11-05 DIAGNOSIS — Z23 Encounter for immunization: Secondary | ICD-10-CM

## 2019-11-05 DIAGNOSIS — C642 Malignant neoplasm of left kidney, except renal pelvis: Secondary | ICD-10-CM | POA: Diagnosis not present

## 2019-11-05 DIAGNOSIS — Z95828 Presence of other vascular implants and grafts: Secondary | ICD-10-CM

## 2019-11-05 DIAGNOSIS — C649 Malignant neoplasm of unspecified kidney, except renal pelvis: Secondary | ICD-10-CM | POA: Diagnosis not present

## 2019-11-05 LAB — CBC WITH DIFFERENTIAL (CANCER CENTER ONLY)
Abs Immature Granulocytes: 0.02 10*3/uL (ref 0.00–0.07)
Basophils Absolute: 0.1 10*3/uL (ref 0.0–0.1)
Basophils Relative: 1 %
Eosinophils Absolute: 0.7 10*3/uL — ABNORMAL HIGH (ref 0.0–0.5)
Eosinophils Relative: 6 %
HCT: 42.1 % (ref 39.0–52.0)
Hemoglobin: 13.5 g/dL (ref 13.0–17.0)
Immature Granulocytes: 0 %
Lymphocytes Relative: 23 %
Lymphs Abs: 2.4 10*3/uL (ref 0.7–4.0)
MCH: 29.2 pg (ref 26.0–34.0)
MCHC: 32.1 g/dL (ref 30.0–36.0)
MCV: 91.1 fL (ref 80.0–100.0)
Monocytes Absolute: 1 10*3/uL (ref 0.1–1.0)
Monocytes Relative: 9 %
Neutro Abs: 6.4 10*3/uL (ref 1.7–7.7)
Neutrophils Relative %: 61 %
Platelet Count: 218 10*3/uL (ref 150–400)
RBC: 4.62 MIL/uL (ref 4.22–5.81)
RDW: 16.1 % — ABNORMAL HIGH (ref 11.5–15.5)
WBC Count: 10.4 10*3/uL (ref 4.0–10.5)
nRBC: 0 % (ref 0.0–0.2)

## 2019-11-05 LAB — CMP (CANCER CENTER ONLY)
ALT: 28 U/L (ref 0–44)
AST: 19 U/L (ref 15–41)
Albumin: 3.4 g/dL — ABNORMAL LOW (ref 3.5–5.0)
Alkaline Phosphatase: 62 U/L (ref 38–126)
Anion gap: 7 (ref 5–15)
BUN: 15 mg/dL (ref 8–23)
CO2: 28 mmol/L (ref 22–32)
Calcium: 9.3 mg/dL (ref 8.9–10.3)
Chloride: 105 mmol/L (ref 98–111)
Creatinine: 0.93 mg/dL (ref 0.61–1.24)
GFR, Est AFR Am: >60 mL/min (ref >60)
GFR, Estimated: >60 mL/min (ref >60)
Glucose, Bld: 108 mg/dL — ABNORMAL HIGH (ref 70–99)
Potassium: 4.2 mmol/L (ref 3.5–5.1)
Sodium: 140 mmol/L (ref 135–145)
Total Bilirubin: 0.4 mg/dL (ref 0.3–1.2)
Total Protein: 6.4 g/dL — ABNORMAL LOW (ref 6.5–8.1)

## 2019-11-05 LAB — MAGNESIUM: Magnesium: 2.1 mg/dL (ref 1.7–2.4)

## 2019-11-05 MED ORDER — HEPARIN SOD (PORK) LOCK FLUSH 100 UNIT/ML IV SOLN
500.0000 [IU] | Freq: Once | INTRAVENOUS | Status: AC
  Administered 2019-11-05: 500 [IU]
  Filled 2019-11-05: qty 5

## 2019-11-05 MED ORDER — SODIUM CHLORIDE 0.9% FLUSH
10.0000 mL | Freq: Once | INTRAVENOUS | Status: AC
  Administered 2019-11-05: 10 mL
  Filled 2019-11-05: qty 10

## 2019-11-05 NOTE — Progress Notes (Signed)
   Covid-19 Vaccination Clinic  Name:  Cordarro Spinnato    MRN: [id number] DOB: [birth year]  11/05/2019  Mr. Cordaro was observed post Covid-19 immunization for 15 minutes without incident. He was provided with Vaccine Information Sheet and instruction to access the V-Safe system.   Mr. Tones was instructed to call 911 with any severe reactions post vaccine: [name] Difficulty breathing  . Swelling of face and throat  . A fast heartbeat  . A bad rash all over body  . Dizziness and weakness

## 2019-11-05 NOTE — Progress Notes (Addendum)
Phillip [name] OFFICE PROGRESS NOTE   Diagnosis: Renal cell carcinoma  INTERVAL HISTORY:   [name] returns as scheduled.  He continues cabozantinib.  He has "good days and bad days".  Intermittent nausea.  Bowels moving fairly regularly.  Appetite varies.  He rates his pain at a "2".  He takes 1 oxycodone in the afternoon.  The skin nodule at the right scalp is largely unchanged.  Objective:  Vital signs in last 24 hours:  Blood pressure (!) 130/56, pulse 62, temperature 97.6 F (36.4 C), temperature source Tympanic, resp. rate 17, height 5\' 10"  (1.778 m), weight 244 lb (110.7 kg), SpO2 99 %.    HEENT: No thrush or ulcers. Resp: Lungs clear bilaterally. Cardio: Regular rate and rhythm. GI: Abdomen soft and nontender.  No hepatomegaly. Vascular: Trace edema of the lower legs bilaterally. Neuro: Alert and oriented. Skin: 1 cm erythematous nodular lesion at the right parietal scalp. Port-A-Cath without erythema.   Lab Results:  Lab Results  Component Value Date   WBC 10.4 11/05/2019   HGB 13.5 11/05/2019   HCT 42.1 11/05/2019   MCV 91.1 11/05/2019   PLT 218 11/05/2019   NEUTROABS 6.4 11/05/2019    Imaging:  No results found.  Medications: I have reviewed the patient's current medications.  Assessment/Plan: 1. Metastatic renal cell carcinoma   L4 mass with extraosseous extension, L4 nerve compression  Biopsy of the L4 mass 11/18/2015 confirmed metastatic renal cell carcinoma, clear cell type  CTs of the chest, abdomen, and pelvis 11/18/2015-right lower lobe nodule, expansile lytic lesion at the right 11th rib/costal vertebral junction, left renal mass, expansile lesion involving the L4 vertebra, lytic lesion at the left acetabulum, and a low-attenuation liver lesion  Initiation of SRS to L4 12/02/2015, Completed 12/12/2015  Initiation of Pazopanib11/04/2015  Pazopanibplaced on hold 02/06/2016 secondary to elevated liver enzymes   Pazopanibresumed 03/07/2016 at a dose of 400 mg daily  Pazopanibdiscontinued 03/19/2016 secondary to elevated liver enzymes  Restaging CTs 04/02/2016-stable left renal mass, decreased soft tissue component associated with the L4 metastasis, increased soft tissue component associated with the right 11th rib metastasis with increased T11 bony destruction, increased sclerosis at the left acetabulum lesion  Cycle 1 nivolumab 04/12/2016  Cycle 2 nivolumab 04/26/2016  Cycle 3 nivolumab03/16/2018  Cycle 4 nivolumab 05/24/2016  Cycle 5 nivolumab 06/08/2016  MRI lumbar spine 06/21/2016-unchanged tumor at L3, increased size of retroperitoneal lymph nodes compared to a CT from 04/02/2016  Cycle 6 nivolumab 06/22/2016  CTs chest, abdomen, and pelvis 07/04/2016-enlargement of the left renal mass, right adrenal nodule, left hilar and peritoneal lymph nodes, enlargement of left acetabular lesion. Stable lung nodules.  Cycle 7 nivolumab05/12/2016   Cycle 8 nivolumab 07/20/2016  Cycle 9 nivolumab 08/02/2016  Cycle 10 nivolumab 08/20/2016  Restaging CT 09/03/2016 evaluation with stable disease  Cycle 11 nivolumab 09/05/2016  Cycle 12 nivolumab 09/19/2016  Cycle 13 nivolumab 10/03/2016  Cycle 14 nivolumab 10/17/2016  Cycle 15 nivolumab 10/31/2016  Cycle 16 nivolumab 11/14/2016 (changed to monthly schedule)  Cycle 17nivolumab10/24/2018  CTs 01/21/2017-increased left renal mass, increased size of adrenal metastases, increased lytic bone lesions, increased left lung nodule, persistent tumor at L4 with probable epidural component  Initiation of Cabozantinib12/04/2016  Restaging CTs 05/30/2017-decreased size of left hilar mass, left renal mass, retroperitoneal adenopathy, and adrenal metastasis. Healing bone lesions.  Cabozantinib continued  CTs 10/21/2017-interval enlargement left hilar lymph node; stable rib lesions; stable mass left renal cortex; stable mildly nodular adrenal  glands; stable lytic lesions within  the pelvis and spine.  Cabozantinib continued  CTs 02/21/2018-enlargement of an AP window lymph node. Mildly enlarged left hilar lymph node is unchanged. Primary renal cell carcinoma involving the upper pole of the left kidney appears similar. Stable enlarged right periaortic lymph node adjacent to the renal vessels. Stable multifocal bony metastatic disease.  Cabozantinibcontinued  CTs 07/29/2018-stable 15 mm AP window nodes; stable left hilar node; subcarinal node slightly larger; right periaortic node 16 mm, previously 12 mm; portacaval node 24 mm, previously 16 mm; 15 mm node superior to the pancreatic head has enlarged; interval increase nodularity of both adrenal glands; stable left kidney mass; multifocal bony metastatic disease not significantly changed.  Cabozantinib continued  CTs 11/06/2018-moderate improvement in thoracic adenopathy; minimal improvement abdominal adenopathy; left kidney upper pole mass and various lytic expansile bone lesions stable; mild increase in nodularity of left adrenal gland; right adrenal gland nodularity stable.  Cabozantinib continued  Clinical evidence of partial seizure activity December 2020  CT head 02/25/2019-extensive vasogenic edema, left greater than right. Peripheral enhancing masses consistent with metastases. No hemorrhage.  MRI brain 03/11/2019-7 enhancing brain masses consistent with metastatic disease  SRS to 7 brain lesions, treatment given 03/19/2019, 03/23/2019, and 03/25/2019  CTs 04/09/2019-decrease in left renal mass, bilateral adrenal nodules, AP window and porta hepatic adenopathy. New 9 mm right lower lobe nodule. Improved lytic lesion of the left acetabulum. Other bone lesions are stable.  Cabozantinib continued  MRI brain 07/17/2019-resolution of 4 mm treated lesion in the right frontal cortex, 6 remaining treated lesions have decreased in size, new punctate metastasis in the superior  right cerebellum  SRS to right cerebellar lesion 07/30/2019  CTs 08/17/2019-previously noted right lower lobe nodule resolved, stable left kidney mass, mixed lytic/sclerotic bone lesions in the thoracolumbar spine, right posterior ribs, and left acetabulum-unchanged, no evidence of progressive disease   Cabozantinib continued  MRI brain 10/23/2019-stable to slight decrease in size of multiple enhancing intracranial lesions.  Slight increase in surrounding edema in the medial left frontal lobe.  No new lesions present. 2. Pain secondary to #1-managed by Dr. Lovenia Shuck.Improved 3. Hypertension 3. Elevated transaminases 02/06/2016-Pazopanibplaced on hold  Liver enzymes normal 03/07/2016 5. Port-A-Cath placement 05/16/2016 6. Malaise/anorexia 09/05/2016. Cortisol and testosterone levels low. Hydrocortisone and testosterone replacement initiated. 7. Conjunctival/scleral erythema 09/19/2016-resolved with steroid eyedrops 8. 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 9. Hypercalcemia status post Zometa 01/23/2017-resolved 10. Pruritic rash following IV contrast 10/21/2017;rash following IV contrast 02/21/2018 despite prednisone/Benadryl premedication 11. Brief episodes of expressive aphasia October and December 2020   Disposition: Mr. Kempker appears stable.  There is no clinical evidence of disease progression.  He will continue cabozantinib.  The scalp nodule is stable.  We will continue to monitor.  We reviewed the CBC and chemistry panel from today.  Labs adequate to continue with treatment.  He will return for lab and follow-up in 6 weeks.  Covid booster today.  Patient seen with Dr. [name].    [name] ANP/GNP-BC   11/05/2019  10:53 AM This was a shared visit with [name].  Mr. Ickes was interviewed and examined.  He appears stable.  The right parietal scalp nodule is stable in size and is softer today.  The plan  is to continue observation of the scalp nodule.  He will continue cabozantinib.  Julieanne Manson, MD

## 2019-11-06 ENCOUNTER — Telehealth: Payer: Self-pay | Admitting: Nurse Practitioner

## 2019-11-06 NOTE — Telephone Encounter (Signed)
Scheduled appointments per 9/9 los. Left message for patient with appointment date and time.

## 2019-11-09 MED FILL — CABOMETYX 40 MG TABLET: 40 | 30 days supply | Qty: 30 | Fill #2

## 2019-11-11 MED FILL — HYDROCORTISONE 10 MG TABLET: 10 | 30 days supply | Qty: 90 | Fill #2

## 2019-11-11 MED FILL — LOSARTAN-HCTZ 100-12.5 MG T: 100-12.5 | 30 days supply | Qty: 30 | Fill #9

## 2019-11-11 MED FILL — TESTOSTERONE 1.62 % GEL: 1.62 | 30 days supply | Qty: 75 | Fill #3

## 2019-11-11 MED FILL — levETIRAcetam 500 MG TABS: 500 | 30 days supply | Qty: 60 | Fill #1

## 2019-11-14 MED FILL — OxyCONTIN 10 MG T12A: 10 | 30 days supply | Qty: 120 | Fill #0

## 2019-12-02 ENCOUNTER — Other Ambulatory Visit: Payer: Self-pay | Admitting: Oncology

## 2019-12-02 ENCOUNTER — Other Ambulatory Visit (HOSPITAL_COMMUNITY): Payer: Self-pay | Admitting: Oncology

## 2019-12-02 NOTE — Telephone Encounter (Signed)
For your review

## 2019-12-04 MED FILL — LOSARTAN-HCTZ 100-12.5 MG T: 100-12.5 | 30 days supply | Qty: 30 | Fill #10

## 2019-12-04 MED FILL — HYDROCORTISONE 10 MG TABLET: 10 | 30 days supply | Qty: 90 | Fill #3

## 2019-12-08 ENCOUNTER — Other Ambulatory Visit: Payer: Self-pay | Admitting: Oncology

## 2019-12-08 ENCOUNTER — Telehealth: Payer: Self-pay

## 2019-12-08 ENCOUNTER — Other Ambulatory Visit: Payer: Self-pay | Admitting: *Deleted

## 2019-12-08 MED ORDER — CABOMETYX 40 MG PO TABS
ORAL_TABLET | ORAL | 2 refills | Status: DC
Start: 2019-12-08 — End: 2020-02-24

## 2019-12-08 MED ORDER — OXYCODONE HCL ER 10 MG PO T12A
20.0000 mg | EXTENDED_RELEASE_TABLET | Freq: Two times a day (BID) | ORAL | 0 refills | Status: DC
Start: 2019-12-08 — End: 2020-01-08

## 2019-12-08 MED ORDER — LEVETIRACETAM 500 MG PO TABS
500.0000 mg | ORAL_TABLET | Freq: Two times a day (BID) | ORAL | 1 refills | Status: DC
Start: 2019-12-08 — End: 2020-02-11

## 2019-12-08 MED FILL — CABOMETYX 40 MG TABLET: 40 | 30 days supply | Qty: 30 | Fill #0

## 2019-12-08 MED FILL — OxyCONTIN 10 MG T12A: 10 | 30 days supply | Qty: 120 | Fill #0

## 2019-12-08 MED FILL — levETIRAcetam 500 MG TABS: 500 | 30 days supply | Qty: 60 | Fill #0

## 2019-12-08 NOTE — Telephone Encounter (Signed)
Wife states Phillip [name]'s pain mgmt physician has left the practice. Wants to know if Dr [name] will assume prescribing Oxycontin 10 mg, 2 tablets BID. They need an early fill as they are leaving town for 2 weeks and will run out before returning home.   Dr [name] is OK taking over and will escribe to [place]

## 2019-12-08 NOTE — Telephone Encounter (Signed)
Oral Oncology Patient Advocate Encounter  Was successful in securing patient a $10000 grant from [facility] to provide copayment coverage for Cabometyx.  This will keep the out of pocket expense at $0.     [employer] ID: [id number]  I have spoken with the patient.   The billing information is as follows and has been shared with [place]: [id number] PCN: [id number] Member ID: [id number] Group ID: [id number] Dates of Eligibility: 11/12/19 through 11/10/20  Fund:  Renal cell  [place] Patient [place] Phone [phone] Fax [phone] 12/08/2019 9:55 AM

## 2019-12-11 ENCOUNTER — Telehealth: Payer: Self-pay | Admitting: Oncology

## 2019-12-11 NOTE — Telephone Encounter (Signed)
Scheduled per 09/09 los, patient has been called and voicemail was left. 

## 2019-12-15 ENCOUNTER — Telehealth: Payer: Self-pay | Admitting: Oncology

## 2019-12-15 NOTE — Telephone Encounter (Signed)
Called pt per 10/19 sch msg - no answer. Left message for patient to call back to reschedule appt .

## 2019-12-21 ENCOUNTER — Inpatient Hospital Stay: Payer: PPO

## 2019-12-21 ENCOUNTER — Inpatient Hospital Stay: Payer: PPO | Admitting: Oncology

## 2019-12-29 ENCOUNTER — Inpatient Hospital Stay: Payer: PPO

## 2019-12-29 ENCOUNTER — Ambulatory Visit: Payer: PPO | Admitting: Oncology

## 2019-12-29 ENCOUNTER — Inpatient Hospital Stay: Payer: PPO | Admitting: Nurse Practitioner

## 2019-12-29 ENCOUNTER — Other Ambulatory Visit: Payer: Self-pay

## 2019-12-29 ENCOUNTER — Encounter: Payer: Self-pay | Admitting: Nurse Practitioner

## 2019-12-29 ENCOUNTER — Inpatient Hospital Stay: Payer: PPO | Attending: Oncology

## 2019-12-29 ENCOUNTER — Telehealth: Payer: Self-pay | Admitting: Nurse Practitioner

## 2019-12-29 VITALS — BP 129/62 | HR 64 | Temp 97.0°F | Resp 17 | Ht 70.0 in | Wt 240.1 lb

## 2019-12-29 DIAGNOSIS — C649 Malignant neoplasm of unspecified kidney, except renal pelvis: Secondary | ICD-10-CM

## 2019-12-29 DIAGNOSIS — C7951 Secondary malignant neoplasm of bone: Secondary | ICD-10-CM | POA: Insufficient documentation

## 2019-12-29 DIAGNOSIS — Z95828 Presence of other vascular implants and grafts: Secondary | ICD-10-CM

## 2019-12-29 DIAGNOSIS — C642 Malignant neoplasm of left kidney, except renal pelvis: Secondary | ICD-10-CM | POA: Insufficient documentation

## 2019-12-29 DIAGNOSIS — R220 Localized swelling, mass and lump, head: Secondary | ICD-10-CM | POA: Diagnosis not present

## 2019-12-29 DIAGNOSIS — Z79899 Other long term (current) drug therapy: Secondary | ICD-10-CM | POA: Diagnosis not present

## 2019-12-29 DIAGNOSIS — C7931 Secondary malignant neoplasm of brain: Secondary | ICD-10-CM | POA: Diagnosis not present

## 2019-12-29 DIAGNOSIS — I10 Essential (primary) hypertension: Secondary | ICD-10-CM | POA: Insufficient documentation

## 2019-12-29 LAB — CMP (CANCER CENTER ONLY)
ALT: 24 U/L (ref 0–44)
AST: 21 U/L (ref 15–41)
Albumin: 3.5 g/dL (ref 3.5–5.0)
Alkaline Phosphatase: 66 U/L (ref 38–126)
Anion gap: 6 (ref 5–15)
BUN: 17 mg/dL (ref 8–23)
CO2: 30 mmol/L (ref 22–32)
Calcium: 9 mg/dL (ref 8.9–10.3)
Chloride: 101 mmol/L (ref 98–111)
Creatinine: 0.87 mg/dL (ref 0.61–1.24)
GFR, Estimated: >60 mL/min (ref >60)
Glucose, Bld: 103 mg/dL — ABNORMAL HIGH (ref 70–99)
Potassium: 4.1 mmol/L (ref 3.5–5.1)
Sodium: 137 mmol/L (ref 135–145)
Total Bilirubin: 0.3 mg/dL (ref 0.3–1.2)
Total Protein: 6.2 g/dL — ABNORMAL LOW (ref 6.5–8.1)

## 2019-12-29 LAB — MAGNESIUM: Magnesium: 2 mg/dL (ref 1.7–2.4)

## 2019-12-29 LAB — CBC WITH DIFFERENTIAL (CANCER CENTER ONLY)
Abs Immature Granulocytes: 0.01 10*3/uL (ref 0.00–0.07)
Basophils Absolute: 0 10*3/uL (ref 0.0–0.1)
Basophils Relative: 0 %
Eosinophils Absolute: 0.6 10*3/uL — ABNORMAL HIGH (ref 0.0–0.5)
Eosinophils Relative: 8 %
HCT: 41.9 % (ref 39.0–52.0)
Hemoglobin: 13.5 g/dL (ref 13.0–17.0)
Immature Granulocytes: 0 %
Lymphocytes Relative: 32 %
Lymphs Abs: 2.4 10*3/uL (ref 0.7–4.0)
MCH: 28.9 pg (ref 26.0–34.0)
MCHC: 32.2 g/dL (ref 30.0–36.0)
MCV: 89.7 fL (ref 80.0–100.0)
Monocytes Absolute: 0.6 10*3/uL (ref 0.1–1.0)
Monocytes Relative: 8 %
Neutro Abs: 4 10*3/uL (ref 1.7–7.7)
Neutrophils Relative %: 52 %
Platelet Count: 200 10*3/uL (ref 150–400)
RBC: 4.67 MIL/uL (ref 4.22–5.81)
RDW: 15.5 % (ref 11.5–15.5)
WBC Count: 7.7 10*3/uL (ref 4.0–10.5)
nRBC: 0 % (ref 0.0–0.2)

## 2019-12-29 MED ORDER — HEPARIN SOD (PORK) LOCK FLUSH 100 UNIT/ML IV SOLN
500.0000 [IU] | Freq: Once | INTRAVENOUS | Status: AC
  Administered 2019-12-29: 500 [IU]
  Filled 2019-12-29: qty 5

## 2019-12-29 MED ORDER — SODIUM CHLORIDE 0.9% FLUSH
10.0000 mL | Freq: Once | INTRAVENOUS | Status: AC
  Administered 2019-12-29: 10 mL
  Filled 2019-12-29: qty 10

## 2019-12-29 NOTE — Telephone Encounter (Signed)
Scheduled per los. Gave avs and calendar  

## 2019-12-29 NOTE — Progress Notes (Addendum)
[place] OFFICE PROGRESS NOTE   Diagnosis: Renal cell carcinoma  INTERVAL HISTORY:   Mr. Stineman returns as scheduled.  He continues cabozantinib.  He has occasional mild nausea.  No rash.  No diarrhea.  No change in chronic pain.  Appetite is stable.  He continues to note an alteration in taste.  No seizures.  Scalp nodule is stable.  Objective:  Vital signs in last 24 hours:  Blood pressure 129/62, pulse 64, temperature (!) 97 F (36.1 C), temperature source Tympanic, resp. rate 17, height 5\' 10"  (1.778 m), weight 240 lb 1.6 oz (108.9 kg), SpO2 100 %.    HEENT: No thrush or ulcers. Resp: Lungs clear bilaterally. Cardio: Regular rate and rhythm. GI: Abdomen soft and nontender.  No hepatomegaly. Vascular: No leg edema. Neuro: Alert and oriented. Skin: Stable 1 cm nodular lesion right parietal scalp. Port-A-Cath without erythema.   Lab Results:  Lab Results  Component Value Date   WBC 7.7 12/29/2019   HGB 13.5 12/29/2019   HCT 41.9 12/29/2019   MCV 89.7 12/29/2019   PLT 200 12/29/2019   NEUTROABS 4.0 12/29/2019    Imaging:  No results found.  Medications: I have reviewed the patient's current medications.  Assessment/Plan: 1. Metastatic renal cell carcinoma   L4 mass with extraosseous extension, L4 nerve compression  Biopsy of the L4 mass 11/18/2015 confirmed metastatic renal cell carcinoma, clear cell type  CTs of the chest, abdomen, and pelvis 11/18/2015-right lower lobe nodule, expansile lytic lesion at the right 11th rib/costal vertebral junction, left renal mass, expansile lesion involving the L4 vertebra, lytic lesion at the left acetabulum, and a low-attenuation liver lesion  Initiation of SRS to L4 12/02/2015, Completed 12/12/2015  Initiation of Pazopanib11/04/2015  Pazopanibplaced on hold 02/06/2016 secondary to elevated liver enzymes  Pazopanibresumed 03/07/2016 at a dose of 400 mg daily  Pazopanibdiscontinued 03/19/2016  secondary to elevated liver enzymes  Restaging CTs 04/02/2016-stable left renal mass, decreased soft tissue component associated with the L4 metastasis, increased soft tissue component associated with the right 11th rib metastasis with increased T11 bony destruction, increased sclerosis at the left acetabulum lesion  Cycle 1 nivolumab 04/12/2016  Cycle 2 nivolumab 04/26/2016  Cycle 3 nivolumab03/16/2018  Cycle 4 nivolumab 05/24/2016  Cycle 5 nivolumab 06/08/2016  MRI lumbar spine 06/21/2016-unchanged tumor at L3, increased size of retroperitoneal lymph nodes compared to a CT from 04/02/2016  Cycle 6 nivolumab 06/22/2016  CTs chest, abdomen, and pelvis 07/04/2016-enlargement of the left renal mass, right adrenal nodule, left hilar and peritoneal lymph nodes, enlargement of left acetabular lesion. Stable lung nodules.  Cycle 7 nivolumab05/12/2016   Cycle 8 nivolumab 07/20/2016  Cycle 9 nivolumab 08/02/2016  Cycle 10 nivolumab 08/20/2016  Restaging CT 09/03/2016 evaluation with stable disease  Cycle 11 nivolumab 09/05/2016  Cycle 12 nivolumab 09/19/2016  Cycle 13 nivolumab 10/03/2016  Cycle 14 nivolumab 10/17/2016  Cycle 15 nivolumab 10/31/2016  Cycle 16 nivolumab 11/14/2016 (changed to monthly schedule)  Cycle 17nivolumab10/24/2018  CTs 01/21/2017-increased left renal mass, increased size of adrenal metastases, increased lytic bone lesions, increased left lung nodule, persistent tumor at L4 with probable epidural component  Initiation of Cabozantinib12/04/2016  Restaging CTs 05/30/2017-decreased size of left hilar mass, left renal mass, retroperitoneal adenopathy, and adrenal metastasis. Healing bone lesions.  Cabozantinib continued  CTs 10/21/2017-interval enlargement left hilar lymph node; stable rib lesions; stable mass left renal cortex; stable mildly nodular adrenal glands; stable lytic lesions within the pelvis and spine.  Cabozantinib continued  CTs  02/21/2018-enlargement of  an AP window lymph node. Mildly enlarged left hilar lymph node is unchanged. Primary renal cell carcinoma involving the upper pole of the left kidney appears similar. Stable enlarged right periaortic lymph node adjacent to the renal vessels. Stable multifocal bony metastatic disease.  Cabozantinibcontinued  CTs 07/29/2018-stable 15 mm AP window nodes; stable left hilar node; subcarinal node slightly larger; right periaortic node 16 mm, previously 12 mm; portacaval node 24 mm, previously 16 mm; 15 mm node superior to the pancreatic head has enlarged; interval increase nodularity of both adrenal glands; stable left kidney mass; multifocal bony metastatic disease not significantly changed.  Cabozantinib continued  CTs 11/06/2018-moderate improvement in thoracic adenopathy; minimal improvement abdominal adenopathy; left kidney upper pole mass and various lytic expansile bone lesions stable; mild increase in nodularity of left adrenal gland; right adrenal gland nodularity stable.  Cabozantinib continued  Clinical evidence of partial seizure activity December 2020  CT head 02/25/2019-extensive vasogenic edema, left greater than right. Peripheral enhancing masses consistent with metastases. No hemorrhage.  MRI brain 03/11/2019-7 enhancing brain masses consistent with metastatic disease  SRS to 7 brain lesions, treatment given 03/19/2019, 03/23/2019, and 03/25/2019  CTs 04/09/2019-decrease in left renal mass, bilateral adrenal nodules, AP window and porta hepatic adenopathy. New 9 mm right lower lobe nodule. Improved lytic lesion of the left acetabulum. Other bone lesions are stable.  Cabozantinib continued  MRI brain 07/17/2019-resolution of 4 mm treated lesion in the right frontal cortex, 6 remaining treated lesions have decreased in size, new punctate metastasis in the superior right cerebellum  SRS to right cerebellar lesion 07/30/2019  CTs 08/17/2019-previously  noted right lower lobe nodule resolved, stable left kidney mass, mixed lytic/sclerotic bone lesions in the thoracolumbar spine, right posterior ribs, and left acetabulum-unchanged, no evidence of progressive disease  Cabozantinib continued  MRI brain 10/23/2019-stable to slight decrease in size of multiple enhancing intracranial lesions.  Slight increase in surrounding edema in the medial left frontal lobe.  No new lesions present. 2. Pain secondary to #1-managed by Dr. Lovenia Shuck.Improved 3. Hypertension 3. Elevated transaminases 02/06/2016-Pazopanibplaced on hold  Liver enzymes normal 03/07/2016 5. Port-A-Cath placement 05/16/2016 6. Malaise/anorexia 09/05/2016. Cortisol and testosterone levels low. Hydrocortisone and testosterone replacement initiated. 7. Conjunctival/scleral erythema 09/19/2016-resolved with steroid eyedrops 8. 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 9. Hypercalcemia status post Zometa 01/23/2017-resolved 10. Pruritic rash following IV contrast 10/21/2017;rash following IV contrast 02/21/2018 despite prednisone/Benadryl premedication 11. Brief episodes of expressive aphasia October and December 2020    Disposition: Mr. Mcdill appears stable.  There is no clinical evidence of disease progression.  He will continue cabozantinib.  Plan for restaging CTs in approximately 6 weeks.  We reviewed the labs from today, adequate to continue cabozantinib.  He will return for lab and follow-up on 02/11/2020.  He will contact the office in the interim with any problems.  Patient seen with Dr. [name].    [name] ANP/GNP-BC   12/29/2019  11:23 AM  This was a shared visit with [name].  Mr. Fullington appears unchanged.  He will continue cabozantinib.  He will undergo a restaging CT evaluation prior to an office visit in 6 weeks.  Julieanne Manson, MD

## 2019-12-29 NOTE — Patient Instructions (Signed)

## 2020-01-05 MED FILL — CABOMETYX 40 MG TABLET: 40 | 30 days supply | Qty: 30 | Fill #1

## 2020-01-07 ENCOUNTER — Other Ambulatory Visit: Payer: Self-pay | Admitting: Oncology

## 2020-01-07 ENCOUNTER — Other Ambulatory Visit: Payer: Self-pay | Admitting: *Deleted

## 2020-01-07 DIAGNOSIS — C649 Malignant neoplasm of unspecified kidney, except renal pelvis: Secondary | ICD-10-CM

## 2020-01-07 MED ORDER — TESTOSTERONE 1.62 % TD GEL
TRANSDERMAL | 3 refills | Status: DC
Start: 2020-01-07 — End: 2020-05-20

## 2020-01-07 MED FILL — HYDROCORTISONE 10 MG TABLET: 10 | 30 days supply | Qty: 90 | Fill #0

## 2020-01-07 MED FILL — MIRTAZAPINE 30 MG TABLET: 30 | 90 days supply | Qty: 90 | Fill #0

## 2020-01-07 MED FILL — levETIRAcetam 500 MG TABS: 500 | 30 days supply | Qty: 60 | Fill #1

## 2020-01-07 MED FILL — LOSARTAN-HCTZ 100-12.5 MG T: 100-12.5 | 30 days supply | Qty: 30 | Fill #11

## 2020-01-07 NOTE — Telephone Encounter (Signed)
refill 

## 2020-01-08 ENCOUNTER — Other Ambulatory Visit: Payer: Self-pay | Admitting: Oncology

## 2020-01-08 MED ORDER — OXYCODONE HCL ER 10 MG PO T12A
20.0000 mg | EXTENDED_RELEASE_TABLET | Freq: Two times a day (BID) | ORAL | 0 refills | Status: DC
Start: 2020-01-08 — End: 2020-02-15

## 2020-01-08 MED FILL — OxyCONTIN 10 MG T12A: 10 | 30 days supply | Qty: 120 | Fill #0

## 2020-01-08 MED FILL — TESTOSTERONE 1.62 % GEL: 1.62 | 30 days supply | Qty: 75 | Fill #0

## 2020-01-15 DIAGNOSIS — C649 Malignant neoplasm of unspecified kidney, except renal pelvis: Secondary | ICD-10-CM | POA: Diagnosis not present

## 2020-01-15 DIAGNOSIS — E785 Hyperlipidemia, unspecified: Secondary | ICD-10-CM | POA: Diagnosis not present

## 2020-01-15 DIAGNOSIS — I10 Essential (primary) hypertension: Secondary | ICD-10-CM | POA: Diagnosis not present

## 2020-01-29 ENCOUNTER — Other Ambulatory Visit: Payer: Self-pay

## 2020-01-29 ENCOUNTER — Ambulatory Visit
Admission: RE | Admit: 2020-01-29 | Discharge: 2020-01-29 | Disposition: A | Discharge status: Home / Self Care | Payer: PPO | Source: Ambulatory Visit | Attending: Internal Medicine | Admitting: Internal Medicine

## 2020-01-29 DIAGNOSIS — C7931 Secondary malignant neoplasm of brain: Secondary | ICD-10-CM

## 2020-01-29 DIAGNOSIS — Z95828 Presence of other vascular implants and grafts: Secondary | ICD-10-CM

## 2020-01-29 DIAGNOSIS — C649 Malignant neoplasm of unspecified kidney, except renal pelvis: Secondary | ICD-10-CM | POA: Diagnosis not present

## 2020-01-29 MED ORDER — HEPARIN SOD (PORK) LOCK FLUSH 100 UNIT/ML IV SOLN
500.0000 [IU] | Freq: Once | INTRAVENOUS | Status: AC
  Administered 2020-01-29: 500 [IU] via INTRAVENOUS

## 2020-01-29 MED ORDER — SODIUM CHLORIDE 0.9% FLUSH
10.0000 mL | INTRAVENOUS | Status: DC | PRN
  Administered 2020-01-29: 10 mL via INTRAVENOUS

## 2020-01-29 MED ORDER — GADOBENATE DIMEGLUMINE 529 MG/ML IV SOLN
20.0000 mL | Freq: Once | INTRAVENOUS | Status: AC | PRN
  Administered 2020-01-29: 20 mL via INTRAVENOUS

## 2020-02-01 ENCOUNTER — Inpatient Hospital Stay: Payer: PPO

## 2020-02-01 ENCOUNTER — Telehealth: Payer: Self-pay | Admitting: *Deleted

## 2020-02-01 ENCOUNTER — Inpatient Hospital Stay: Payer: PPO | Attending: Oncology | Admitting: Internal Medicine

## 2020-02-01 ENCOUNTER — Other Ambulatory Visit: Payer: Self-pay

## 2020-02-01 VITALS — BP 124/54 | HR 65 | Temp 97.4°F | Resp 18 | Ht 70.0 in | Wt 240.8 lb

## 2020-02-01 DIAGNOSIS — M549 Dorsalgia, unspecified: Secondary | ICD-10-CM | POA: Insufficient documentation

## 2020-02-01 DIAGNOSIS — C642 Malignant neoplasm of left kidney, except renal pelvis: Secondary | ICD-10-CM | POA: Insufficient documentation

## 2020-02-01 DIAGNOSIS — M79606 Pain in leg, unspecified: Secondary | ICD-10-CM | POA: Diagnosis not present

## 2020-02-01 DIAGNOSIS — I10 Essential (primary) hypertension: Secondary | ICD-10-CM | POA: Insufficient documentation

## 2020-02-01 DIAGNOSIS — C7931 Secondary malignant neoplasm of brain: Secondary | ICD-10-CM | POA: Insufficient documentation

## 2020-02-01 DIAGNOSIS — M899 Disorder of bone, unspecified: Secondary | ICD-10-CM | POA: Diagnosis not present

## 2020-02-01 DIAGNOSIS — D630 Anemia in neoplastic disease: Secondary | ICD-10-CM | POA: Insufficient documentation

## 2020-02-01 DIAGNOSIS — R232 Flushing: Secondary | ICD-10-CM | POA: Diagnosis not present

## 2020-02-01 DIAGNOSIS — Z79899 Other long term (current) drug therapy: Secondary | ICD-10-CM | POA: Diagnosis not present

## 2020-02-01 MED FILL — predniSONE 50 MG TABS: 50 | 1 days supply | Qty: 3 | Fill #1

## 2020-02-01 MED FILL — CABOMETYX 40 MG TABLET: 40 | 30 days supply | Qty: 30 | Fill #2

## 2020-02-01 NOTE — Telephone Encounter (Signed)
Called to request refill on Prednisone 50 mg for CT scan on 02/08/20. Confirmed w/[facility] he still had a refill. They will process it and ship.

## 2020-02-01 NOTE — Progress Notes (Signed)
[facility] [place], [place] [address] [phone]   Interval Evaluation  Date of Service: 02/01/20 Patient Name: Phillip [name] Patient MRN: [id number] Patient DOB: 1945/08/22 Provider: [name], MD  Identifying Statement:  Phillip [name] is a 74 y.o. male with Brain metastases [facility]) [C79.31]   Primary Cancer: Renal Cell Carcinoma  CNS Oncologic History: 03/19/19-03/25/19 SRS Treatment: PTV1 Lt Temporal 33m 27 Gy in 3 fractions PTV2 Lt Occipital 113m 27 Gy in 3 fractions PTV6 Lt Frontal 2028m27 Gy in 3 fractions  03/19/19 SRS Treatment:  PTV3 Rt Occipital 26m28mTV4 Lt Frontal 12mm40mTV5 Rt Frontal 4mm  104m7 Lt Parietal 9mm  A36mof these were treated to 20 Gy in 1 fraction  12/02/2015 to 12/12/2015 SBRT Treatment:  The L4 Right spinal was treated to 50 Gy in 5 fractions at 10 Gy per fraction  Interval History:  Phillip [name] for follow up after recent brain MRI.  He denies new or progressive neurologic deficits [name].  No headaches or seizures. Remains active at home and at church..  Takes cabometyx for metastatic renal cell carcinoma.     Medications: Current Outpatient Medications on File Prior to Visit  Medication Sig Dispense Refill  . acetaminophen (TYLENOL) 650 MG CR tablet Take 650 mg by mouth every 8 (eight) hours as needed for pain.    . cabozantinib (CABOMETYX) 40 MG tablet TAKE 1 TABLET (40 MG TOTAL) BY MOUTH DAILY. TAKE 1 HOUR BEFORE OR 2 HOURS AFTER MEALS. 30 tablet 2  . diphenhydrAMINE (BENADRYL) 50 MG tablet Take 1 tablet (50 mg total) by mouth as directed. Take Benadryl 50 mg by mouth 1 hour before CT scan. (Patient not taking: Reported on 08/20/2019) 1 tablet 0  . docusate sodium (COLACE) 100 MG capsule Take 100 mg by mouth 2 (two) times daily.    . hydrocortisone (CORTEF) 10 MG tablet TAKE 2 TABLETS BY MOUTH IN THE MORNING AND 1 TABLET IN THE EVENING 90 tablet 3   . ibuprofen (ADVIL,MOTRIN) 200 MG tablet Take 200 mg by mouth every 8 (eight) hours as needed for fever, headache, mild pain, moderate pain or cramping.     . levETIRAcetam (KEPPRA) 500 MG tablet Take 1 tablet (500 mg total) by mouth 2 (two) times daily. 60 tablet 1  . lidocaine-prilocaine (EMLA) cream APPLY TO PORTACATH 1 HOUR PRIOR TO USE AS NEEDED 30 g 2  . LORazepam (ATIVAN) 0.5 MG tablet Take 1-2 tablets (0.5-1 mg total) by mouth every 6 (six) hours as needed for anxiety. (Patient not taking: Reported on 08/20/2019) 60 tablet 2  . LORazepam (ATIVAN) 1 MG tablet Take 1 tablet (1 mg total) by mouth as directed. Take 1 hour prior to MRI (Patient not taking: Reported on 08/20/2019) 1 tablet 0  . losartan-hydrochlorothiazide (HYZAAR) 100-12.5 MG tablet Take 1 tablet by mouth daily.     . mirtazapine (REMERON) 30 MG tablet Take 1 tablet (30 mg total) by mouth at bedtime. 90 tablet 1  . Multiple Vitamin (MULTIVITAMIN ADULT PO) Take 1 tablet by mouth daily. [place] product    . ondansetron (ZOFRAN) 8 MG tablet TAKE 1 TABLET BY MOUTH EVERY 8 HOURS AS NEEDED FOR NAUSEA AND VOMITING 90 tablet 2  . oxyCODONE (OXYCONTIN) 10 mg 12 hr tablet Take 2 tablets (20 mg total) by mouth every 12 (twelve) hours. 120 tablet 0  . polyethylene glycol (MIRALAX /  GLYCOLAX) 17 g packet Take 17 g by mouth daily.    . predniSONE (DELTASONE) 50 MG tablet Take 50 mg 13 hours, 7 hours and 1 hour prior to CT scan. Hold decadron on days taking prednisone (Patient not taking: Reported on 08/20/2019) 3 tablet 3  . Testosterone 1.62 % GEL APPLY 2 PUMPS ONCE A DAY AS DIRECTED 75 g 3   Current Facility-Administered Medications on File Prior to Visit  Medication Dose Route Frequency Provider Last Rate Last Admin  . sodium chloride flush (NS) 0.9 % injection 10 mL  10 mL Intravenous PRN [name], MD   10 mL at 06/22/16 9798    Allergies:  Allergies  Allergen Reactions  . Contrast Media [Iodinated Diagnostic Agents] Rash    Past Medical History:  Past Medical History:  Diagnosis Date  . Anxiety   . Brain cancer ([place])   . Chronic kidney disease    renal cancer  . Constipation   . Depression   . Hypertension   . Low testosterone in male   . 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 Seizures ([place])    last seizure 01/2019 - controlled since on keppra  . Wears glasses    Past Surgical History:  Past Surgical History:  Procedure Laterality Date  . insertion port-a-cath  2018  . IR GENERIC HISTORICAL  11/18/2015   IR FLUORO GUIDED NEEDLE PLC ASPIRATION/INJECTION LOC 11/18/2015 [name], MD [facility]-INTERV RAD  . IR GENERIC HISTORICAL  05/16/2016   IR FLUORO GUIDE PORT INSERTION RIGHT 05/16/2016 [name], MD [facility]INTER[facility] RAD  . IR GENERIC HISTORICAL  05/16/2016   IR US GUIDE VASC ACCESS RIGHT 05/16/2016 [name], MD [facility]INTER[facility]  . RADIOLOGY WITH ANESTHESIA N/A 06/16/2019   Procedure: MRI BRAIN WITH AND WITHOUT CONTRAST WITH ANESTHESIA;  Surgeon: Radiologist, Medication, MD;  Location: [facility];  Service: Radiology;  Laterality: N/A;   Social History:  Social History   Socioeconomic History  . Marital status: Single    Spouse name: Not on file  . Number of children: Not on file  . Years of education: Not on file  . Highest education level: Not on file  Occupational History  . Not on file  Tobacco Use  . Smoking status: Never Smoker  . Smokeless tobacco: Never Used  Vaping Use  . Vaping Use: Never used  Substance and Sexual Activity  . Alcohol use: Not Currently    Alcohol/week: 1.0 standard drink    Types: 1 Glasses of wine per week  . Drug use: Yes    Types: Marijuana    Comment: Last use 06/13/19  . Sexual activity: Not Currently  Other Topics Concern  . Not on file  Social History Narrative  . Not on file   Social Determinants of Health   Financial Resource Strain:   . Difficulty of Paying Living Expenses: Not on file  Food Insecurity:   . Worried About Paediatric nurse in the Last Year: Not on file  . Ran Out of Food in the Last Year: Not on file  Transportation Needs:   . Lack of Transportation (Medical): Not on file  . Lack of Transportation (Non-Medical): Not on file  Physical Activity:   . Days of Exercise per Week: Not on file  . Minutes of Exercise per Session: Not on file  Stress:   . Feeling of Stress : Not on file  Social Connections:   . Frequency of Communication with  Friends and Family: Not on file  . Frequency of Social Gatherings with Friends and Family: Not on file  . Attends Religious Services: Not on file  . Active Member of Clubs or Organizations: Not on file  . Attends Archivist Meetings: Not on file  . Marital Status: Not on file  Intimate Partner Violence:   . Fear of Current or Ex-Partner: Not on file  . Emotionally Abused: Not on file  . Physically Abused: Not on file  . Sexually Abused: Not on file   Family History:  Family History  Problem Relation Age of Onset  . Cancer Grandchild        lymphoma    Review of Systems: Constitutional: Doesn't report fevers, chills or abnormal weight loss Eyes: Doesn't report blurriness of vision Ears, nose, mouth, throat, and face: Doesn't report sore throat Respiratory: Doesn't report cough, dyspnea or wheezes Cardiovascular: Doesn't report palpitation, chest discomfort  Gastrointestinal:  Doesn't report nausea, constipation, diarrhea GU: Doesn't report incontinence Skin: Doesn't report skin rashes Neurological: Per HPI Musculoskeletal: Doesn't report joint pain Behavioral/Psych: Doesn't report anxiety  Physical Exam: Vitals:   02/01/20 1219  BP: (!) 124/54  Pulse: 65  Resp: 18  Temp: (!) 97.4 F (36.3 C)  SpO2: 100%   KPS: 90. General: Alert, cooperative, pleasant, in no acute distress Head: Normal EENT: No conjunctival injection or scleral icterus.  Lungs: Resp effort normal Cardiac: Regular rate Abdomen: Non-distended abdomen Skin: No  rashes cyanosis or petechiae. Extremities: No clubbing or edema  Neurologic Exam: Mental Status: Awake, alert, attentive to examiner. Oriented to self and environment. Language is fluent with intact comprehension.  Cranial Nerves: Visual acuity is grossly normal. Visual fields are full. Extra-ocular movements intact. No ptosis. Face is symmetric Motor: Tone and bulk are normal. Power is full in both arms and legs. Reflexes are symmetric, no pathologic reflexes present.  Sensory: Intact to light touch Gait: Normal.   Labs: I have reviewed the data as listed    Component Value Date/Time   NA 137 12/29/2019 1003   NA 140 02/21/2017 1128   K 4.1 12/29/2019 1003   K 4.3 02/21/2017 1128   CL 101 12/29/2019 1003   CO2 30 12/29/2019 1003   CO2 26 02/21/2017 1128   GLUCOSE 103 (H) 12/29/2019 1003   GLUCOSE 115 02/21/2017 1128   BUN 17 12/29/2019 1003   BUN 16.6 02/21/2017 1128   CREATININE 0.87 12/29/2019 1003   CREATININE 0.8 02/21/2017 1128   CALCIUM 9.0 12/29/2019 1003   CALCIUM 9.4 02/21/2017 1128   PROT 6.2 (L) 12/29/2019 1003   PROT 7.5 02/21/2017 1128   ALBUMIN 3.5 12/29/2019 1003   ALBUMIN 4.2 02/21/2017 1128   AST 21 12/29/2019 1003   AST 20 02/21/2017 1128   ALT 24 12/29/2019 1003   ALT 43 02/21/2017 1128   ALKPHOS 66 12/29/2019 1003   ALKPHOS 104 02/21/2017 1128   BILITOT 0.3 12/29/2019 1003   BILITOT 0.36 02/21/2017 1128   GFRNONAA >60 12/29/2019 1003   GFRAA >60 11/05/2019 0957   Lab Results  Component Value Date   WBC 7.7 12/29/2019   NEUTROABS 4.0 12/29/2019   HGB 13.5 12/29/2019   HCT 41.9 12/29/2019   MCV 89.7 12/29/2019   PLT 200 12/29/2019    Imaging:  MR BRAIN W WO CONTRAST  Result Date: 01/30/2020 CLINICAL DATA:  Metastatic renal cell carcinoma. Assess treatment response. EXAM: MRI HEAD WITHOUT AND WITH CONTRAST TECHNIQUE: Multiplanar, multiecho pulse sequences of the brain  and surrounding structures were obtained without and with intravenous  contrast. CONTRAST:  60m MULTIHANCE GADOBENATE DIMEGLUMINE 529 MG/ML IV SOLN COMPARISON:  MRI head 10/23/2019 FINDINGS: Brain: Multiple enhancing lesions the brain compatible with metastatic disease. 1.5 mm enhancing lesion right lateral cerebellum axial image 41 slightly smaller 1.5 mm enhancing lesion left inferior cerebellum slightly larger axial image 23 Hemorrhagic lesion left occipital lobe peripherally slightly larger 12 mm. Axial image 49. Hemorrhagic lesion right occipital lobe slightly larger 7 mm in diameter. Axial image 65 6 mm hemorrhagic lesion left posterior parietal lobe unchanged axial image 104 Hemorrhagic lesion left medial frontal lobe axial image 124 slightly larger now measuring 10 mm. This lesion shows slight restricted diffusion due to hemorrhage. Slight decrease in vasogenic edema. Hemorrhagic lesion left frontal convexity unchanged. This lesion measures 11.5 mm axial image 142. Ventricle size normal. Ventricle size normal without midline shift. No acute infarct. 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 Skull and upper cervical spine: No focal skeletal lesion identified. Sinuses/Orbits: Mild mucosal edema paranasal sinuses. Negative orbit Other: None IMPRESSION: Multiple metastatic nodules deposits in the brain. Many lesions are hemorrhagic. Several lesions show slight interval growth which could be due to treatment response or actual tumor growth. Electronically Signed   By: CFranchot GalloM.D.   On: 01/30/2020 10:20    [facility] Clinician Interpretation: I have personally reviewed the radiological images as listed.  My interpretation, in the context of the patient's clinical presentation, is likely treatment effect   Assessment/Plan Brain metastases ([facility] [C79.31]  Phillip [name] clinically stable [name].  MRI demonstrates modest enlargement of 3 previously treated metastases with hemorrhagic component, consistent with likely  radio-inflammatory effect. Otherwise continues to demonstrate good cognitive and motor function.  He should continue Keppra 505mBID for seizure prevention.  We ask that [facility] House[facility] to clinic in 3 months following next brain MRI, or sooner as needed.  We appreciate the opportunity to participate in the care of [place]  All questions were answered. The patient knows to call the clinic with any problems, questions or concerns. No barriers to learning were detected.  I have spent a total of 30 minutes of face-to-face and non-face-to-face time, excluding clinical staff time, preparing to see patient, ordering tests and/or medications, counseling the patient, and independently interpreting results and communicating results to the patient/family/caregiver    ZaVentura SellersMD Medical Director of Neuro-Oncology [facility] WeWaipio2/06/21 12:17 PM

## 2020-02-04 ENCOUNTER — Inpatient Hospital Stay: Payer: PPO | Admitting: Internal Medicine

## 2020-02-08 ENCOUNTER — Inpatient Hospital Stay: Payer: PPO

## 2020-02-08 ENCOUNTER — Ambulatory Visit (HOSPITAL_COMMUNITY)
Admission: RE | Admit: 2020-02-08 | Discharge: 2020-02-08 | Disposition: A | Discharge status: Home / Self Care | Payer: PPO | Source: Ambulatory Visit | Attending: Nurse Practitioner | Admitting: Nurse Practitioner

## 2020-02-08 ENCOUNTER — Encounter (HOSPITAL_COMMUNITY): Payer: Self-pay

## 2020-02-08 ENCOUNTER — Other Ambulatory Visit: Payer: Self-pay

## 2020-02-08 DIAGNOSIS — C642 Malignant neoplasm of left kidney, except renal pelvis: Secondary | ICD-10-CM | POA: Diagnosis not present

## 2020-02-08 DIAGNOSIS — C649 Malignant neoplasm of unspecified kidney, except renal pelvis: Secondary | ICD-10-CM

## 2020-02-08 DIAGNOSIS — C689 Malignant neoplasm of urinary organ, unspecified: Secondary | ICD-10-CM | POA: Diagnosis not present

## 2020-02-08 DIAGNOSIS — I2510 Atherosclerotic heart disease of native coronary artery without angina pectoris: Secondary | ICD-10-CM | POA: Diagnosis not present

## 2020-02-08 DIAGNOSIS — N2889 Other specified disorders of kidney and ureter: Secondary | ICD-10-CM | POA: Insufficient documentation

## 2020-02-08 DIAGNOSIS — I700 Atherosclerosis of aorta: Secondary | ICD-10-CM | POA: Diagnosis not present

## 2020-02-08 LAB — CBC WITH DIFFERENTIAL (CANCER CENTER ONLY)
Abs Immature Granulocytes: 0.02 10*3/uL (ref 0.00–0.07)
Basophils Absolute: 0 10*3/uL (ref 0.0–0.1)
Basophils Relative: 0 %
Eosinophils Absolute: 0 10*3/uL (ref 0.0–0.5)
Eosinophils Relative: 0 %
HCT: 44 % (ref 39.0–52.0)
Hemoglobin: 14.2 g/dL (ref 13.0–17.0)
Immature Granulocytes: 0 %
Lymphocytes Relative: 17 %
Lymphs Abs: 1.3 10*3/uL (ref 0.7–4.0)
MCH: 28.7 pg (ref 26.0–34.0)
MCHC: 32.3 g/dL (ref 30.0–36.0)
MCV: 88.9 fL (ref 80.0–100.0)
Monocytes Absolute: 0.1 10*3/uL (ref 0.1–1.0)
Monocytes Relative: 1 %
Neutro Abs: 6.1 10*3/uL (ref 1.7–7.7)
Neutrophils Relative %: 82 %
Platelet Count: 256 10*3/uL (ref 150–400)
RBC: 4.95 MIL/uL (ref 4.22–5.81)
RDW: 15.4 % (ref 11.5–15.5)
WBC Count: 7.5 10*3/uL (ref 4.0–10.5)
nRBC: 0 % (ref 0.0–0.2)

## 2020-02-08 LAB — CMP (CANCER CENTER ONLY)
ALT: 30 U/L (ref 0–44)
AST: 20 U/L (ref 15–41)
Albumin: 3.7 g/dL (ref 3.5–5.0)
Alkaline Phosphatase: 81 U/L (ref 38–126)
Anion gap: 8 (ref 5–15)
BUN: 15 mg/dL (ref 8–23)
CO2: 28 mmol/L (ref 22–32)
Calcium: 9.7 mg/dL (ref 8.9–10.3)
Chloride: 99 mmol/L (ref 98–111)
Creatinine: 0.91 mg/dL (ref 0.61–1.24)
GFR, Estimated: >60 mL/min (ref >60)
Glucose, Bld: 159 mg/dL — ABNORMAL HIGH (ref 70–99)
Potassium: 4.4 mmol/L (ref 3.5–5.1)
Sodium: 135 mmol/L (ref 135–145)
Total Bilirubin: 0.4 mg/dL (ref 0.3–1.2)
Total Protein: 7 g/dL (ref 6.5–8.1)

## 2020-02-08 LAB — MAGNESIUM: Magnesium: 1.9 mg/dL (ref 1.7–2.4)

## 2020-02-08 MED ORDER — HEPARIN SOD (PORK) LOCK FLUSH 100 UNIT/ML IV SOLN
INTRAVENOUS | Status: AC
  Filled 2020-02-08: qty 5

## 2020-02-08 MED ORDER — DIPHENHYDRAMINE HCL 25 MG PO CAPS
ORAL_CAPSULE | ORAL | Status: AC
  Filled 2020-02-08: qty 2

## 2020-02-08 MED ORDER — SODIUM CHLORIDE (PF) 0.9 % IJ SOLN
INTRAMUSCULAR | Status: AC
  Filled 2020-02-08: qty 50

## 2020-02-08 MED ORDER — IOHEXOL 300 MG/ML  SOLN
100.0000 mL | Freq: Once | INTRAMUSCULAR | Status: AC | PRN
  Administered 2020-02-08: 14:00:00 100 mL via INTRAVENOUS

## 2020-02-08 MED ORDER — IOHEXOL 300 MG/ML  SOLN: 75.0000 mL | Freq: Once | INTRAMUSCULAR | Status: DC | PRN

## 2020-02-08 MED ORDER — ACETAMINOPHEN 325 MG PO TABS
ORAL_TABLET | ORAL | Status: AC
  Filled 2020-02-08: qty 2

## 2020-02-10 ENCOUNTER — Other Ambulatory Visit: Payer: Self-pay | Admitting: Oncology

## 2020-02-10 DIAGNOSIS — C649 Malignant neoplasm of unspecified kidney, except renal pelvis: Secondary | ICD-10-CM

## 2020-02-10 MED FILL — TESTOSTERONE 1.62 % GEL: 1.62 | 30 days supply | Qty: 75 | Fill #1

## 2020-02-10 MED FILL — HYDROCORTISONE 10 MG TABLET: 10 | 30 days supply | Qty: 90 | Fill #1

## 2020-02-11 ENCOUNTER — Inpatient Hospital Stay: Payer: PPO | Admitting: Oncology

## 2020-02-11 ENCOUNTER — Other Ambulatory Visit: Payer: Self-pay

## 2020-02-11 ENCOUNTER — Other Ambulatory Visit: Payer: Self-pay | Admitting: Radiation Therapy

## 2020-02-11 VITALS — BP 139/57 | HR 74 | Temp 97.1°F | Resp 16 | Ht 70.0 in | Wt 240.0 lb

## 2020-02-11 DIAGNOSIS — C642 Malignant neoplasm of left kidney, except renal pelvis: Secondary | ICD-10-CM | POA: Diagnosis not present

## 2020-02-11 DIAGNOSIS — C649 Malignant neoplasm of unspecified kidney, except renal pelvis: Secondary | ICD-10-CM | POA: Diagnosis not present

## 2020-02-11 MED FILL — ONDANSETRON HCL 8 MG TABLET: 8 | 30 days supply | Qty: 90 | Fill #0

## 2020-02-11 MED FILL — levETIRAcetam 500 MG TABS: 500 | 30 days supply | Qty: 60 | Fill #0

## 2020-02-11 MED FILL — LOSARTAN-HCTZ 100-12.5 MG T: 100-12.5 | 30 days supply | Qty: 30 | Fill #0

## 2020-02-11 NOTE — Progress Notes (Signed)
[facility] OFFICE PROGRESS NOTE   Diagnosis: Renal cell carcinoma  INTERVAL HISTORY:   Phillip [name] returns as scheduled.  He continues cabozantinib.  No rash or diarrhea.  He feels well.  He has intermittent hot flashes.  His appetite is poor, but he is maintaining his weight.  No change in the right back and leg pain.  Takes OxyContin twice daily.  He uses oxycodone in the afternoon as needed. The right parietal scalp lesion has decreased in size.  No seizures.  Objective:  Vital signs in last 24 hours:  Blood pressure (!) 139/57, pulse 74, temperature (!) 97.1 F (36.2 C), temperature source Tympanic, resp. rate 16, height 5\' 10"  (1.778 m), weight 240 lb (108.9 kg), SpO2 100 %.   Lymphatics: No cervical, supraclavicular, axillary, or inguinal nodes Resp: Lungs clear bilaterally Cardio: Regular rate and rhythm GI: No mass, no hepatosplenomegaly Vascular: No leg edema  Skin: Raised lesion at the right parietal scalp is smaller Musculoskeletal: No pain with motion at the hips  Portacath/PICC-without erythema  Lab Results:  Lab Results  Component Value Date   WBC 7.5 02/08/2020   HGB 14.2 02/08/2020   HCT 44.0 02/08/2020   MCV 88.9 02/08/2020   PLT 256 02/08/2020   NEUTROABS 6.1 02/08/2020    CMP  Lab Results  Component Value Date   NA 135 02/08/2020   K 4.4 02/08/2020   CL 99 02/08/2020   CO2 28 02/08/2020   GLUCOSE 159 (H) 02/08/2020   BUN 15 02/08/2020   CREATININE 0.91 02/08/2020   CALCIUM 9.7 02/08/2020   PROT 7.0 02/08/2020   ALBUMIN 3.7 02/08/2020   AST 20 02/08/2020   ALT 30 02/08/2020   ALKPHOS 81 02/08/2020   BILITOT 0.4 02/08/2020   GFRNONAA >60 02/08/2020   GFRAA >60 11/05/2019    Lab Results  Component Value Date   CEA1 1.77 01/04/2016    Lab Results  Component Value Date   INR 0.98 05/16/2016    Imaging:  CT Chest W Contrast  Result Date: 02/09/2020 CLINICAL DATA:  Urologic cancer. Oral chemotherapy in progress.  Radiation therapy in therapy completed. EXAM: CT CHEST, ABDOMEN, AND PELVIS WITH CONTRAST TECHNIQUE: Multidetector CT imaging of the chest, abdomen and pelvis was performed following the standard protocol during bolus administration of intravenous contrast. CONTRAST:  145mL OMNIPAQUE IOHEXOL 300 MG/ML  SOLN COMPARISON:  None. FINDINGS: CT CHEST FINDINGS Cardiovascular: Port in the anterior chest wall with tip in distal SVC. Coronary artery calcification and aortic atherosclerotic calcification. Mediastinum/Nodes: No axillary or supraclavicular adenopathy. No mediastinal or hilar adenopathy. No pericardial fluid. Esophagus normal. Lungs/Pleura: No suspicious pulmonary nodules Musculoskeletal: No aggressive osseous lesion. CT ABDOMEN AND PELVIS FINDINGS Hepatobiliary: Low-density lesion central liver consistent benign cyst unchanged. Several gallstones noted. No evidence acute cholecystitis. Fundus of the gallbladder is contracted. No change from prior. Pancreas: Pancreas is normal. No ductal dilatation. No pancreatic inflammation. Spleen: Normal spleen Adrenals/urinary tract: Small nodules within both the LEFT and RIGHT adrenal gland similar prior. Peripheral enhancing mass in the superolateral aspect of the LEFT kidney measures 3.7 x 3.6 cm compared to 3.2 by 3.2 cm (image 50/series 2). No additional enhancing lesions in the LEFT or RIGHT kidney. Ureters normal. Bladder normal. Stomach/Bowel: Stomach, small bowel, appendix, and cecum are normal. The colon and rectosigmoid colon are normal. Vascular/Lymphatic: Abdominal aorta is normal caliber with atherosclerotic calcification. There is no retroperitoneal or periportal lymphadenopathy. No pelvic lymphadenopathy. Reproductive: Prostate enlarged measuring 66 cm in diameter Other:  No peritoneal fluid or nodularity Musculoskeletal: Round lytic lesion T9 vertebral body measures 18 mm increased from 7 mm (image 73/7). A previously described RIGHT L4 lesion appears very  similar. Expansile lesion in the medial aspect of the T11 vertebral body measures 3.9 x 2.1 cm compared to 3.6 by 2.0 cm. IMPRESSION: Chest Impression: 1. No evidence of thoracic metastasis. 2. Coronary artery calcification and Aortic Atherosclerosis (ICD10-I70.0). Abdomen / Pelvis Impression: 1. Interval enlargement of the enhancing solid LEFT renal mass. 2. Interval enlargement of lytic lesion within the T9 vertebral body. 3. Additional previous described lytic lesions in the spine ribs are unchanged. Electronically Signed   By: Suzy Bouchard M.D.   On: 02/09/2020 08:02   CT Abdomen Pelvis W Contrast  Result Date: 02/09/2020 CLINICAL DATA:  Urologic cancer. Oral chemotherapy in progress. Radiation therapy in therapy completed. EXAM: CT CHEST, ABDOMEN, AND PELVIS WITH CONTRAST TECHNIQUE: Multidetector CT imaging of the chest, abdomen and pelvis was performed following the standard protocol during bolus administration of intravenous contrast. CONTRAST:  123mL OMNIPAQUE IOHEXOL 300 MG/ML  SOLN COMPARISON:  None. FINDINGS: CT CHEST FINDINGS Cardiovascular: Port in the anterior chest wall with tip in distal SVC. Coronary artery calcification and aortic atherosclerotic calcification. Mediastinum/Nodes: No axillary or supraclavicular adenopathy. No mediastinal or hilar adenopathy. No pericardial fluid. Esophagus normal. Lungs/Pleura: No suspicious pulmonary nodules Musculoskeletal: No aggressive osseous lesion. CT ABDOMEN AND PELVIS FINDINGS Hepatobiliary: Low-density lesion central liver consistent benign cyst unchanged. Several gallstones noted. No evidence acute cholecystitis. Fundus of the gallbladder is contracted. No change from prior. Pancreas: Pancreas is normal. No ductal dilatation. No pancreatic inflammation. Spleen: Normal spleen Adrenals/urinary tract: Small nodules within both the LEFT and RIGHT adrenal gland similar prior. Peripheral enhancing mass in the superolateral aspect of the LEFT kidney  measures 3.7 x 3.6 cm compared to 3.2 by 3.2 cm (image 50/series 2). No additional enhancing lesions in the LEFT or RIGHT kidney. Ureters normal. Bladder normal. Stomach/Bowel: Stomach, small bowel, appendix, and cecum are normal. The colon and rectosigmoid colon are normal. Vascular/Lymphatic: Abdominal aorta is normal caliber with atherosclerotic calcification. There is no retroperitoneal or periportal lymphadenopathy. No pelvic lymphadenopathy. Reproductive: Prostate enlarged measuring 66 cm in diameter Other: No peritoneal fluid or nodularity Musculoskeletal: Round lytic lesion T9 vertebral body measures 18 mm increased from 7 mm (image 73/7). A previously described RIGHT L4 lesion appears very similar. Expansile lesion in the medial aspect of the T11 vertebral body measures 3.9 x 2.1 cm compared to 3.6 by 2.0 cm. IMPRESSION: Chest Impression: 1. No evidence of thoracic metastasis. 2. Coronary artery calcification and Aortic Atherosclerosis (ICD10-I70.0). Abdomen / Pelvis Impression: 1. Interval enlargement of the enhancing solid LEFT renal mass. 2. Interval enlargement of lytic lesion within the T9 vertebral body. 3. Additional previous described lytic lesions in the spine ribs are unchanged. Electronically Signed   By: Suzy Bouchard M.D.   On: 02/09/2020 08:02    Medications: I have reviewed the patient's current medications.   Assessment/Plan:  1. Metastatic renal cell carcinoma   L4 mass with extraosseous extension, L4 nerve compression  Biopsy of the L4 mass 11/18/2015 confirmed metastatic renal cell carcinoma, clear cell type  CTs of the chest, abdomen, and pelvis 11/18/2015-right lower lobe nodule, expansile lytic lesion at the right 11th rib/costal vertebral junction, left renal mass, expansile lesion involving the L4 vertebra, lytic lesion at the left acetabulum, and a low-attenuation liver lesion  Initiation of SRS to L4 12/02/2015, Completed 12/12/2015  Initiation of  Pazopanib11/04/2015  Pazopanibplaced on hold 02/06/2016 secondary to elevated liver enzymes  Pazopanibresumed 03/07/2016 at a dose of 400 mg daily  Pazopanibdiscontinued 03/19/2016 secondary to elevated liver enzymes  Restaging CTs 04/02/2016-stable left renal mass, decreased soft tissue component associated with the L4 metastasis, increased soft tissue component associated with the right 11th rib metastasis with increased T11 bony destruction, increased sclerosis at the left acetabulum lesion  Cycle 1 nivolumab 04/12/2016  Cycle 2 nivolumab 04/26/2016  Cycle 3 nivolumab03/16/2018  Cycle 4 nivolumab 05/24/2016  Cycle 5 nivolumab 06/08/2016  MRI lumbar spine 06/21/2016-unchanged tumor at L3, increased size of retroperitoneal lymph nodes compared to a CT from 04/02/2016  Cycle 6 nivolumab 06/22/2016  CTs chest, abdomen, and pelvis 07/04/2016-enlargement of the left renal mass, right adrenal nodule, left hilar and peritoneal lymph nodes, enlargement of left acetabular lesion. Stable lung nodules.  Cycle 7 nivolumab05/12/2016   Cycle 8 nivolumab 07/20/2016  Cycle 9 nivolumab 08/02/2016  Cycle 10 nivolumab 08/20/2016  Restaging CT 09/03/2016 evaluation with stable disease  Cycle 11 nivolumab 09/05/2016  Cycle 12 nivolumab 09/19/2016  Cycle 13 nivolumab 10/03/2016  Cycle 14 nivolumab 10/17/2016  Cycle 15 nivolumab 10/31/2016  Cycle 16 nivolumab 11/14/2016 (changed to monthly schedule)  Cycle 17nivolumab10/24/2018  CTs 01/21/2017-increased left renal mass, increased size of adrenal metastases, increased lytic bone lesions, increased left lung nodule, persistent tumor at L4 with probable epidural component  Initiation of Cabozantinib12/04/2016  Restaging CTs 05/30/2017-decreased size of left hilar mass, left renal mass, retroperitoneal adenopathy, and adrenal metastasis. Healing bone lesions.  Cabozantinib continued  CTs 10/21/2017-interval enlargement left  hilar lymph node; stable rib lesions; stable mass left renal cortex; stable mildly nodular adrenal glands; stable lytic lesions within the pelvis and spine.  Cabozantinib continued  CTs 02/21/2018-enlargement of an AP window lymph node. Mildly enlarged left hilar lymph node is unchanged. Primary renal cell carcinoma involving the upper pole of the left kidney appears similar. Stable enlarged right periaortic lymph node adjacent to the renal vessels. Stable multifocal bony metastatic disease.  Cabozantinibcontinued  CTs 07/29/2018-stable 15 mm AP window nodes; stable left hilar node; subcarinal node slightly larger; right periaortic node 16 mm, previously 12 mm; portacaval node 24 mm, previously 16 mm; 15 mm node superior to the pancreatic head has enlarged; interval increase nodularity of both adrenal glands; stable left kidney mass; multifocal bony metastatic disease not significantly changed.  Cabozantinib continued  CTs 11/06/2018-moderate improvement in thoracic adenopathy; minimal improvement abdominal adenopathy; left kidney upper pole mass and various lytic expansile bone lesions stable; mild increase in nodularity of left adrenal gland; right adrenal gland nodularity stable.  Cabozantinib continued  Clinical evidence of partial seizure activity December 2020  CT head 02/25/2019-extensive vasogenic edema, left greater than right. Peripheral enhancing masses consistent with metastases. No hemorrhage.  MRI brain 03/11/2019-7 enhancing brain masses consistent with metastatic disease  SRS to 7 brain lesions, treatment given 03/19/2019, 03/23/2019, and 03/25/2019  CTs 04/09/2019-decrease in left renal mass, bilateral adrenal nodules, AP window and porta hepatic adenopathy. New 9 mm right lower lobe nodule. Improved lytic lesion of the left acetabulum. Other bone lesions are stable.  Cabozantinib continued  MRI brain 07/17/2019-resolution of 4 mm treated lesion in the right frontal  cortex, 6 remaining treated lesions have decreased in size, new punctate metastasis in the superior right cerebellum  SRS to right cerebellar lesion 07/30/2019  CTs 08/17/2019-previously noted right lower lobe nodule resolved, stable left kidney mass, mixed lytic/sclerotic bone lesions in the thoracolumbar spine, right posterior ribs, and left acetabulum-unchanged, no  evidence of progressive disease  Cabozantinib continued  MRI brain 10/23/2019-stable to slight decrease in size of multiple enhancing intracranial lesions.  Slight increase in surrounding edema in the medial left frontal lobe.  No new lesions present.  MRI brain 01/29/2020-multiple enhancing brain lesions, some hemorrhagic, some with mild enlargement-treatment effect?  CTs 02/08/2020-no thoracic metastases, enlargement of the left renal mass, enlargement of lytic lesion at T9, other lytic lesions unchanged 2. Pain secondary to #1-managed by Dr. Lovenia Shuck.Improved 3. Hypertension 3. Elevated transaminases 02/06/2016-Pazopanibplaced on hold  Liver enzymes normal 03/07/2016 5. Port-A-Cath placement 05/16/2016 6. Malaise/anorexia 09/05/2016. Cortisol and testosterone levels low. Hydrocortisone and testosterone replacement initiated. 7. Conjunctival/scleral erythema 09/19/2016-resolved with steroid eyedrops 8. 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 9. Hypercalcemia status post Zometa 01/23/2017-resolved 10. Pruritic rash following IV contrast 10/21/2017;rash following IV contrast 02/21/2018 despite prednisone/Benadryl premedication 11. Brief episodes of expressive aphasia October and December 2020   Disposition: Phillip [name] appears stable.  The restaging CTs reveal slight enlargement of the left renal mass and a lesion at T9, but there are no new lesions.  Other bone lesions appear unchanged.  I recommend continuing cabozantinib.  He continues follow-up with Dr.  Mickeal Skinner for management of brain metastases.  Mr. Charo will continue the current narcotic pain regimen.  He will return for an office and lab visit on 04/01/2019.  Betsy Coder, MD  02/11/2020  12:58 PM

## 2020-02-12 ENCOUNTER — Telehealth: Payer: Self-pay | Admitting: Oncology

## 2020-02-12 NOTE — Telephone Encounter (Signed)
Scheduled appointments per 12/16 los. Spoke to patient who is aware of appointments dates and times.  

## 2020-02-13 ENCOUNTER — Other Ambulatory Visit: Payer: Self-pay | Admitting: Oncology

## 2020-02-15 ENCOUNTER — Other Ambulatory Visit: Payer: Self-pay | Admitting: Oncology

## 2020-02-15 MED ORDER — OXYCODONE HCL ER 10 MG PO T12A: 20.0000 mg | EXTENDED_RELEASE_TABLET | Freq: Two times a day (BID) | ORAL | 0 refills | Status: DC

## 2020-02-15 MED FILL — OxyCONTIN 10 MG T12A: 10 | 30 days supply | Qty: 120 | Fill #0

## 2020-02-15 NOTE — Telephone Encounter (Signed)
refill 

## 2020-02-23 DIAGNOSIS — E785 Hyperlipidemia, unspecified: Secondary | ICD-10-CM | POA: Diagnosis not present

## 2020-02-23 DIAGNOSIS — R269 Unspecified abnormalities of gait and mobility: Secondary | ICD-10-CM | POA: Diagnosis not present

## 2020-02-23 DIAGNOSIS — Z0001 Encounter for general adult medical examination with abnormal findings: Secondary | ICD-10-CM | POA: Diagnosis not present

## 2020-02-23 DIAGNOSIS — C649 Malignant neoplasm of unspecified kidney, except renal pelvis: Secondary | ICD-10-CM | POA: Diagnosis not present

## 2020-02-23 DIAGNOSIS — Z1389 Encounter for screening for other disorder: Secondary | ICD-10-CM | POA: Diagnosis not present

## 2020-02-23 DIAGNOSIS — I10 Essential (primary) hypertension: Secondary | ICD-10-CM | POA: Diagnosis not present

## 2020-02-23 DIAGNOSIS — M5416 Radiculopathy, lumbar region: Secondary | ICD-10-CM | POA: Diagnosis not present

## 2020-02-24 ENCOUNTER — Other Ambulatory Visit: Payer: Self-pay | Admitting: Oncology

## 2020-03-02 ENCOUNTER — Telehealth: Payer: Self-pay

## 2020-03-02 NOTE — Telephone Encounter (Signed)
Oral Oncology Patient Advocate Encounter  Patient has been approved for copay assistance with [employer] ([employer]).  The Assistance Fund will cover all copayment expenses for Cabometyx for the remainder of the calendar year.    The billing information is as follows and has been shared with [facility].   Member ID: [id number] Group ID: [id number] PCN: [id number] BIN: [id number] Eligibility Dates: 02/27/20 to 02/25/21  Fund: Renal cell  Phillip [name] CPHT [facility] Patient Advocate [facility] Phone [phone] Fax [phone] 03/02/2020 10:58 AM

## 2020-03-03 MED FILL — CABOMETYX 40 MG TABLET: 40 | 30 days supply | Qty: 30 | Fill #0

## 2020-03-14 ENCOUNTER — Other Ambulatory Visit: Payer: Self-pay | Admitting: Oncology

## 2020-03-14 MED FILL — TESTOSTERONE 1.62 % GEL: 1.62 | 30 days supply | Qty: 75 | Fill #2

## 2020-03-14 MED FILL — LOSARTAN-HCTZ 100-12.5 MG T: 100-12.5 | 30 days supply | Qty: 30 | Fill #1

## 2020-03-14 MED FILL — HYDROCORTISONE 10 MG TABLET: 10 | 30 days supply | Qty: 90 | Fill #2

## 2020-03-14 MED FILL — levETIRAcetam 500 MG TABS: 500 | 30 days supply | Qty: 60 | Fill #1

## 2020-03-15 ENCOUNTER — Other Ambulatory Visit: Payer: Self-pay | Admitting: Nurse Practitioner

## 2020-03-15 DIAGNOSIS — C649 Malignant neoplasm of unspecified kidney, except renal pelvis: Secondary | ICD-10-CM

## 2020-03-15 MED ORDER — OXYCODONE HCL ER 10 MG PO T12A: 20.0000 mg | EXTENDED_RELEASE_TABLET | Freq: Two times a day (BID) | ORAL | 0 refills | Status: DC

## 2020-03-15 MED FILL — OxyCONTIN 10 MG T12A: 10 | 30 days supply | Qty: 120 | Fill #0

## 2020-03-18 NOTE — Telephone Encounter (Signed)
Please review I called and confirmed medication was refilled 03/15/20 and shipped to pt this is a duplicate request but will not let me deny it

## 2020-03-21 ENCOUNTER — Telehealth: Payer: Self-pay

## 2020-03-21 NOTE — Telephone Encounter (Signed)
Wife called in to ask about a lab work prescription Mr. Santilli was given by his PCP. Instructed to bring the order in when he comes for his next lab draw. Wife verbalized understanding.

## 2020-03-31 ENCOUNTER — Other Ambulatory Visit: Payer: Self-pay

## 2020-03-31 ENCOUNTER — Inpatient Hospital Stay: Payer: PPO | Admitting: Nurse Practitioner

## 2020-03-31 ENCOUNTER — Inpatient Hospital Stay: Payer: PPO | Attending: Oncology

## 2020-03-31 ENCOUNTER — Inpatient Hospital Stay: Payer: PPO

## 2020-03-31 ENCOUNTER — Encounter: Payer: Self-pay | Admitting: Nurse Practitioner

## 2020-03-31 VITALS — BP 147/63 | HR 69 | Temp 98.1°F | Resp 18 | Ht 70.0 in | Wt 236.1 lb

## 2020-03-31 DIAGNOSIS — E291 Testicular hypofunction: Secondary | ICD-10-CM | POA: Diagnosis not present

## 2020-03-31 DIAGNOSIS — C649 Malignant neoplasm of unspecified kidney, except renal pelvis: Secondary | ICD-10-CM | POA: Diagnosis not present

## 2020-03-31 DIAGNOSIS — C7931 Secondary malignant neoplasm of brain: Secondary | ICD-10-CM | POA: Insufficient documentation

## 2020-03-31 DIAGNOSIS — Z79899 Other long term (current) drug therapy: Secondary | ICD-10-CM | POA: Diagnosis not present

## 2020-03-31 DIAGNOSIS — C642 Malignant neoplasm of left kidney, except renal pelvis: Secondary | ICD-10-CM | POA: Insufficient documentation

## 2020-03-31 DIAGNOSIS — E559 Vitamin D deficiency, unspecified: Secondary | ICD-10-CM | POA: Diagnosis not present

## 2020-03-31 DIAGNOSIS — I10 Essential (primary) hypertension: Secondary | ICD-10-CM | POA: Diagnosis not present

## 2020-03-31 LAB — CBC WITH DIFFERENTIAL (CANCER CENTER ONLY)
Abs Immature Granulocytes: 0.02 10*3/uL (ref 0.00–0.07)
Basophils Absolute: 0 10*3/uL (ref 0.0–0.1)
Basophils Relative: 1 %
Eosinophils Absolute: 0.5 10*3/uL (ref 0.0–0.5)
Eosinophils Relative: 5 %
HCT: 41.8 % (ref 39.0–52.0)
Hemoglobin: 13.6 g/dL (ref 13.0–17.0)
Immature Granulocytes: 0 %
Lymphocytes Relative: 24 %
Lymphs Abs: 2.1 10*3/uL (ref 0.7–4.0)
MCH: 29.6 pg (ref 26.0–34.0)
MCHC: 32.5 g/dL (ref 30.0–36.0)
MCV: 91.1 fL (ref 80.0–100.0)
Monocytes Absolute: 0.6 10*3/uL (ref 0.1–1.0)
Monocytes Relative: 7 %
Neutro Abs: 5.5 10*3/uL (ref 1.7–7.7)
Neutrophils Relative %: 63 %
Platelet Count: 219 10*3/uL (ref 150–400)
RBC: 4.59 MIL/uL (ref 4.22–5.81)
RDW: 15.4 % (ref 11.5–15.5)
WBC Count: 8.7 10*3/uL (ref 4.0–10.5)
nRBC: 0 % (ref 0.0–0.2)

## 2020-03-31 LAB — CMP (CANCER CENTER ONLY)
ALT: 28 U/L (ref 0–44)
AST: 21 U/L (ref 15–41)
Albumin: 3.5 g/dL (ref 3.5–5.0)
Alkaline Phosphatase: 71 U/L (ref 38–126)
Anion gap: 7 (ref 5–15)
BUN: 17 mg/dL (ref 8–23)
CO2: 29 mmol/L (ref 22–32)
Calcium: 9 mg/dL (ref 8.9–10.3)
Chloride: 101 mmol/L (ref 98–111)
Creatinine: 0.85 mg/dL (ref 0.61–1.24)
GFR, Estimated: >60 mL/min (ref >60)
Glucose, Bld: 99 mg/dL (ref 70–99)
Potassium: 4.2 mmol/L (ref 3.5–5.1)
Sodium: 137 mmol/L (ref 135–145)
Total Bilirubin: 0.3 mg/dL (ref 0.3–1.2)
Total Protein: 6.5 g/dL (ref 6.5–8.1)

## 2020-03-31 LAB — MAGNESIUM: Magnesium: 2 mg/dL (ref 1.7–2.4)

## 2020-03-31 NOTE — Progress Notes (Addendum)
[name] OFFICE PROGRESS NOTE   Diagnosis: Renal cell carcinoma  INTERVAL HISTORY:   [name] returns as scheduled.  He continues cabozantinib.  He denies significant nausea.  No mouth sores.  No diarrhea.  He reports a good appetite.  He has lost some weight.  He reports Dr. Inda Merlin is checking his thyroid function.  Pain is stable.  He reports the scalp lesion has resolved.  Objective:  Vital signs in last 24 hours:  Blood pressure (!) 147/63, pulse 69, temperature 98.1 F (36.7 C), temperature source Tympanic, resp. rate 18, height 5\' 10"  (1.778 m), weight 236 lb 1.6 oz (107.1 kg), SpO2 98 %.    HEENT: No thrush or ulcers. Lymphatics: No palpable cervical or supraclavicular lymph nodes. Resp: Lungs clear bilaterally. Cardio: Regular rate and rhythm. GI: Abdomen soft and nontender.  No hepatosplenomegaly.  No mass. Vascular: No leg edema.  Skin: Previous scalp lesion has resolved. Port-A-Cath without erythema.  Lab Results:  Lab Results  Component Value Date   WBC 8.7 03/31/2020   HGB 13.6 03/31/2020   HCT 41.8 03/31/2020   MCV 91.1 03/31/2020   PLT 219 03/31/2020   NEUTROABS 5.5 03/31/2020    Imaging:  No results found.  Medications: I have reviewed the patient's current medications.  Assessment/Plan: 1. Metastatic renal cell carcinoma   L4 mass with extraosseous extension, L4 nerve compression  Biopsy of the L4 mass 11/18/2015 confirmed metastatic renal cell carcinoma, clear cell type  CTs of the chest, abdomen, and pelvis 11/18/2015-right lower lobe nodule, expansile lytic lesion at the right 11th rib/costal vertebral junction, left renal mass, expansile lesion involving the L4 vertebra, lytic lesion at the left acetabulum, and a low-attenuation liver lesion  Initiation of SRS to L4 12/02/2015, Completed 12/12/2015  Initiation of Pazopanib11/04/2015  Pazopanibplaced on hold 02/06/2016 secondary to elevated liver  enzymes  Pazopanibresumed 03/07/2016 at a dose of 400 mg daily  Pazopanibdiscontinued 03/19/2016 secondary to elevated liver enzymes  Restaging CTs 04/02/2016-stable left renal mass, decreased soft tissue component associated with the L4 metastasis, increased soft tissue component associated with the right 11th rib metastasis with increased T11 bony destruction, increased sclerosis at the left acetabulum lesion  Cycle 1 nivolumab 04/12/2016  Cycle 2 nivolumab 04/26/2016  Cycle 3 nivolumab03/16/2018  Cycle 4 nivolumab 05/24/2016  Cycle 5 nivolumab 06/08/2016  MRI lumbar spine 06/21/2016-unchanged tumor at L3, increased size of retroperitoneal lymph nodes compared to a CT from 04/02/2016  Cycle 6 nivolumab 06/22/2016  CTs chest, abdomen, and pelvis 07/04/2016-enlargement of the left renal mass, right adrenal nodule, left hilar and peritoneal lymph nodes, enlargement of left acetabular lesion. Stable lung nodules.  Cycle 7 nivolumab05/12/2016   Cycle 8 nivolumab 07/20/2016  Cycle 9 nivolumab 08/02/2016  Cycle 10 nivolumab 08/20/2016  Restaging CT 09/03/2016 evaluation with stable disease  Cycle 11 nivolumab 09/05/2016  Cycle 12 nivolumab 09/19/2016  Cycle 13 nivolumab 10/03/2016  Cycle 14 nivolumab 10/17/2016  Cycle 15 nivolumab 10/31/2016  Cycle 16 nivolumab 11/14/2016 (changed to monthly schedule)  Cycle 17nivolumab10/24/2018  CTs 01/21/2017-increased left renal mass, increased size of adrenal metastases, increased lytic bone lesions, increased left lung nodule, persistent tumor at L4 with probable epidural component  Initiation of Cabozantinib12/04/2016  Restaging CTs 05/30/2017-decreased size of left hilar mass, left renal mass, retroperitoneal adenopathy, and adrenal metastasis. Healing bone lesions.  Cabozantinib continued  CTs 10/21/2017-interval enlargement left hilar lymph node; stable rib lesions; stable mass left renal cortex; stable mildly  nodular adrenal glands; stable lytic lesions within  the pelvis and spine.  Cabozantinib continued  CTs 02/21/2018-enlargement of an AP window lymph node. Mildly enlarged left hilar lymph node is unchanged. Primary renal cell carcinoma involving the upper pole of the left kidney appears similar. Stable enlarged right periaortic lymph node adjacent to the renal vessels. Stable multifocal bony metastatic disease.  Cabozantinibcontinued  CTs 07/29/2018-stable 15 mm AP window nodes; stable left hilar node; subcarinal node slightly larger; right periaortic node 16 mm, previously 12 mm; portacaval node 24 mm, previously 16 mm; 15 mm node superior to the pancreatic head has enlarged; interval increase nodularity of both adrenal glands; stable left kidney mass; multifocal bony metastatic disease not significantly changed.  Cabozantinib continued  CTs 11/06/2018-moderate improvement in thoracic adenopathy; minimal improvement abdominal adenopathy; left kidney upper pole mass and various lytic expansile bone lesions stable; mild increase in nodularity of left adrenal gland; right adrenal gland nodularity stable.  Cabozantinib continued  Clinical evidence of partial seizure activity December 2020  CT head 02/25/2019-extensive vasogenic edema, left greater than right. Peripheral enhancing masses consistent with metastases. No hemorrhage.  MRI brain 03/11/2019-7 enhancing brain masses consistent with metastatic disease  SRS to 7 brain lesions, treatment given 03/19/2019, 03/23/2019, and 03/25/2019  CTs 04/09/2019-decrease in left renal mass, bilateral adrenal nodules, AP window and porta hepatic adenopathy. New 9 mm right lower lobe nodule. Improved lytic lesion of the left acetabulum. Other bone lesions are stable.  Cabozantinib continued  MRI brain 07/17/2019-resolution of 4 mm treated lesion in the right frontal cortex, 6 remaining treated lesions have decreased in size, new punctate metastasis  in the superior right cerebellum  SRS to right cerebellar lesion 07/30/2019  CTs 08/17/2019-previously noted right lower lobe nodule resolved, stable left kidney mass, mixed lytic/sclerotic bone lesions in the thoracolumbar spine, right posterior ribs, and left acetabulum-unchanged, no evidence of progressive disease  Cabozantinib continued  MRI brain 10/23/2019-stable to slight decrease in size of multiple enhancing intracranial lesions. Slight increase in surrounding edema in the medial left frontal lobe. No new lesions present.  MRI brain 01/29/2020-multiple enhancing brain lesions, some hemorrhagic, some with mild enlargement-treatment effect?  CTs 02/08/2020-no thoracic metastases, enlargement of the left renal mass, enlargement of lytic lesion at T9, other lytic lesions unchanged 2. Pain secondary to #1-managed by Dr. Lovenia Shuck.Improved 3. Hypertension 3. Elevated transaminases 02/06/2016-Pazopanibplaced on hold  Liver enzymes normal 03/07/2016 5. Port-A-Cath placement 05/16/2016 6. Malaise/anorexia 09/05/2016. Cortisol and testosterone levels low. Hydrocortisone and testosterone replacement initiated. 7. Conjunctival/scleral erythema 09/19/2016-resolved with steroid eyedrops 8. 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 9. Hypercalcemia status post Zometa 01/23/2017-resolved 10. Pruritic rash following IV contrast 10/21/2017;rash following IV contrast 02/21/2018 despite prednisone/Benadryl premedication 11. Brief episodes of expressive aphasia October and December 2020    Disposition: [name].  There is no clinical evidence of disease progression.  He will continue cabozantinib.  Plan for restaging CTs at a 4 to 65-month interval.  CBC and chemistry panel from today reviewed.  Labs adequate to continue with cabozantinib as above.  He will return for lab, port flush and follow-up in 6 weeks.  We are  available to see him sooner if needed.  Patient seen with Dr. [name].    [name] ANP/GNP-BC   03/31/2020  12:36 PM This was a shared visit with [name].  Mr. Hausmann appears unchanged.  We discussed the restaging plan with Mr. Oxley.  He will continue cabozantinib.  I was present for today's visit and  performed medical decision making.  Julieanne Manson, MD

## 2020-04-01 ENCOUNTER — Telehealth: Payer: Self-pay | Admitting: Nurse Practitioner

## 2020-04-01 NOTE — Telephone Encounter (Signed)
Scheduled appointments per 2/3 los. Called patient, no answer. Left message with appointments date and times.  °

## 2020-04-04 MED FILL — CABOMETYX 40 MG TABLET: 40 | 30 days supply | Qty: 30 | Fill #1

## 2020-04-11 ENCOUNTER — Other Ambulatory Visit: Payer: Self-pay | Admitting: Oncology

## 2020-04-11 ENCOUNTER — Other Ambulatory Visit: Payer: Self-pay | Admitting: Nurse Practitioner

## 2020-04-11 ENCOUNTER — Encounter: Payer: Self-pay | Admitting: *Deleted

## 2020-04-11 DIAGNOSIS — C649 Malignant neoplasm of unspecified kidney, except renal pelvis: Secondary | ICD-10-CM

## 2020-04-11 MED FILL — HYDROCORTISONE 10 MG TABLET: 10 | 30 days supply | Qty: 90 | Fill #3

## 2020-04-11 MED FILL — levETIRAcetam 500 MG TABS: 500 | 30 days supply | Qty: 60 | Fill #0

## 2020-04-11 MED FILL — LOSARTAN-HCTZ 100-12.5 MG T: 100-12.5 | 30 days supply | Qty: 30 | Fill #0

## 2020-04-11 NOTE — Telephone Encounter (Signed)
For your approval or refusal. Melanie M Rodgers, RN  

## 2020-04-12 ENCOUNTER — Other Ambulatory Visit: Payer: Self-pay | Admitting: Nurse Practitioner

## 2020-04-12 ENCOUNTER — Telehealth: Payer: Self-pay | Admitting: Internal Medicine

## 2020-04-12 ENCOUNTER — Other Ambulatory Visit: Payer: Self-pay | Admitting: *Deleted

## 2020-04-12 DIAGNOSIS — C649 Malignant neoplasm of unspecified kidney, except renal pelvis: Secondary | ICD-10-CM

## 2020-04-12 MED ORDER — OXYCODONE HCL ER 10 MG PO T12A: 20.0000 mg | EXTENDED_RELEASE_TABLET | Freq: Two times a day (BID) | ORAL | 0 refills | Status: DC

## 2020-04-12 MED FILL — OxyCONTIN 10 MG T12A: 10 | 30 days supply | Qty: 120 | Fill #0

## 2020-04-12 NOTE — Telephone Encounter (Signed)
Faxed request for refill on oxycontin 10 mg tablets from [facility]. Forwarded request to MD.

## 2020-04-12 NOTE — Telephone Encounter (Signed)
  Scheduling Message Entered by Claretha Cooper on 04/12/2020 at 10:25 AM Priority: High PORT FLUSH  Department: [facility]  Provider:   Scheduling Notes:  Please schedule pt a port flush appt for this week and every 8 weeks for the next 9 months. Thank you     Called pt per 2/15 sch msg - no answer. Left message for patient to call back to schedule appts.

## 2020-04-13 ENCOUNTER — Other Ambulatory Visit: Payer: Self-pay

## 2020-04-13 ENCOUNTER — Other Ambulatory Visit: Payer: Self-pay | Admitting: *Deleted

## 2020-04-13 DIAGNOSIS — C649 Malignant neoplasm of unspecified kidney, except renal pelvis: Secondary | ICD-10-CM

## 2020-04-13 DIAGNOSIS — C7931 Secondary malignant neoplasm of brain: Secondary | ICD-10-CM

## 2020-04-13 DIAGNOSIS — Z95828 Presence of other vascular implants and grafts: Secondary | ICD-10-CM

## 2020-04-13 MED ORDER — HEPARIN SOD (PORK) LOCK FLUSH 100 UNIT/ML IV SOLN
500.0000 [IU] | Freq: Once | INTRAVENOUS | Status: DC
  Filled 2020-04-13: qty 5

## 2020-04-13 MED ORDER — SODIUM CHLORIDE 0.9% FLUSH
10.0000 mL | INTRAVENOUS | Status: DC | PRN
  Filled 2020-04-13: qty 10

## 2020-04-22 DIAGNOSIS — E785 Hyperlipidemia, unspecified: Secondary | ICD-10-CM | POA: Diagnosis not present

## 2020-04-22 DIAGNOSIS — C649 Malignant neoplasm of unspecified kidney, except renal pelvis: Secondary | ICD-10-CM | POA: Diagnosis not present

## 2020-04-22 DIAGNOSIS — I10 Essential (primary) hypertension: Secondary | ICD-10-CM | POA: Diagnosis not present

## 2020-04-25 ENCOUNTER — Telehealth: Payer: Self-pay | Admitting: Internal Medicine

## 2020-04-25 NOTE — Telephone Encounter (Signed)
Rescheduled appts per 2/28 incoming call. Pt's spouse confirmed new appt date and time.

## 2020-04-27 ENCOUNTER — Inpatient Hospital Stay: Admission: RE | Admit: 2020-04-27 | Payer: PPO | Source: Ambulatory Visit

## 2020-05-02 ENCOUNTER — Encounter (INDEPENDENT_AMBULATORY_CARE_PROVIDER_SITE_OTHER): Payer: Self-pay | Admitting: Otolaryngology

## 2020-05-02 ENCOUNTER — Inpatient Hospital Stay: Payer: PPO

## 2020-05-02 ENCOUNTER — Ambulatory Visit (INDEPENDENT_AMBULATORY_CARE_PROVIDER_SITE_OTHER): Payer: PPO | Admitting: Otolaryngology

## 2020-05-02 ENCOUNTER — Inpatient Hospital Stay: Payer: PPO | Admitting: Internal Medicine

## 2020-05-02 ENCOUNTER — Other Ambulatory Visit: Payer: Self-pay

## 2020-05-02 VITALS — Temp 97.3°F

## 2020-05-02 DIAGNOSIS — H903 Sensorineural hearing loss, bilateral: Secondary | ICD-10-CM

## 2020-05-02 DIAGNOSIS — H6123 Impacted cerumen, bilateral: Secondary | ICD-10-CM

## 2020-05-02 NOTE — Progress Notes (Signed)
HPI: Phillip [name] is a 75 y.o. male who presents for evaluation of hearing aid clearance referred by [facility].  He had a hearing test performed on 04/16/2020 which showed asymmetric hearing loss with hearing loss predominately on the right ear compared to the left.  He has had history of noise exposure in the past as he worked around [employer] cars for a number of years. 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  Apparently over the last year he has noticed progressive hearing loss in the right ear since he has had radiation therapy.  Past Medical History:  Diagnosis Date  . Anxiety   . Brain cancer ([place])   . Chronic kidney disease    renal cancer  . Constipation   . Depression   . Hypertension   . Low testosterone in male   . 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 Seizures ([place])    last seizure 01/2019 - controlled since on keppra  . Wears glasses    Past Surgical History:  Procedure Laterality Date  . insertion port-a-cath  2018  . IR GENERIC HISTORICAL  11/18/2015   IR FLUORO GUIDED NEEDLE PLC ASPIRATION/INJECTION LOC 11/18/2015 [name], MD [facility]INTER[facility]  . IR GENERIC HISTORICAL  05/16/2016   IR FLUORO GUIDE PORT INSERTION RIGHT 05/16/2016 [name], MD [facility]INTER[facility]  . IR GENERIC HISTORICAL  05/16/2016   IR US GUIDE VASC ACCESS RIGHT 05/16/2016 [name], MD [facility]  . RADIOLOGY WITH ANESTHESIA N/A 06/16/2019   Procedure: MRI BRAIN WITH AND WITHOUT CONTRAST WITH ANESTHESIA;  Surgeon: Radiologist, Medication, MD;  Location: [place];  Service: Radiology;  Laterality: N/A;   Social History   Socioeconomic History  . Marital status: Single    Spouse name: Not on file  . Number of children: Not on file  . Years of education: Not on file  . Highest education level: Not on file  Occupational History  . Not on file  Tobacco Use  . Smoking status: Never Smoker  .  Smokeless tobacco: Never Used  Vaping Use  . Vaping Use: Never used  Substance and Sexual Activity  . Alcohol use: Not Currently    Alcohol/week: 1.0 standard drink    Types: 1 Glasses of wine per week  . Drug use: Yes    Types: Marijuana    Comment: Last use 06/13/19  . Sexual activity: Not Currently  Other Topics Concern  . Not on file  Social History Narrative  . Not on file   Social Determinants of Health   Financial Resource Strain: Not on file  Food Insecurity: Not on file  Transportation Needs: Not on file  Physical Activity: Not on file  Stress: Not on file  Social Connections: Not on file   Family History  Problem Relation Age of Onset  . Cancer Grandchild        lymphoma   Allergies  Allergen Reactions  . Contrast Media [Iodinated Diagnostic Agents] Rash   Prior to Admission medications   Medication Sig Start Date End Date Taking? Authorizing Provider  acetaminophen (TYLENOL) 650 MG CR tablet Take 650 mg by mouth every 8 (eight) hours as needed for pain.    [provider]  CABOMETYX 40 MG tablet TAKE 1 TABLET (40 MG TOTAL) BY MOUTH DAILY. TAKE 1 HOUR BEFORE OR 2 HOURS AFTER MEALS. 02/24/20   [name], MD  diphenhydrAMINE (BENADRYL) 50 MG tablet Take 1 tablet (50 mg total) by mouth as directed. Take Benadryl 50 mg by mouth 1 hour before CT scan. Patient not taking: Reported on 08/20/2019 07/28/18   [name], MD  docusate sodium (COLACE) 100 MG capsule Take 100 mg by mouth 2 (two) times daily.    [provider]  hydrocortisone (CORTEF) 10 MG tablet TAKE 2 TABLETS BY MOUTH IN THE MORNING AND 1 TABLET IN THE EVENING 01/07/20   [name], MD  ibuprofen (ADVIL,MOTRIN) 200 MG tablet Take 200 mg by mouth every 8 (eight) hours as needed for fever, headache, mild pain, moderate pain or cramping.     [provider]  levETIRAcetam (KEPPRA) 500 MG tablet TAKE 1 TABLET BY MOUTH 2 TIMES DAILY 04/11/20   [name], MD   lidocaine-prilocaine (EMLA) cream APPLY TO PORTACATH 1 HOUR PRIOR TO USE AS NEEDED 03/02/19   [name], MD  LORazepam (ATIVAN) 0.5 MG tablet Take 1-2 tablets (0.5-1 mg total) by mouth every 6 (six) hours as needed for anxiety. Patient not taking: Reported on 08/20/2019 03/26/19   [name], MD  LORazepam (ATIVAN) 1 MG tablet Take 1 tablet (1 mg total) by mouth as directed. Take 1 hour prior to MRI Patient not taking: Reported on 08/20/2019 02/26/19   [name], MD  losartan-hydrochlorothiazide (HYZAAR) 100-12.5 MG tablet Take 1 tablet by mouth daily.  09/23/15   [provider]  mirtazapine (REMERON) 30 MG tablet Take 1 tablet (30 mg total) by mouth at bedtime. 06/18/19   [name], MD  Multiple Vitamin (MULTIVITAMIN ADULT PO) Take 1 tablet by mouth daily. [employer] product    [provider]  ondansetron (ZOFRAN) 8 MG tablet TAKE 1 TABLET BY MOUTH EVERY 8 HOURS AS NEEDED FOR NAUSEA AND VOMITING 02/11/20   [name], MD  oxyCODONE (OXYCONTIN) 10 mg 12 hr tablet Take 2 tablets (20 mg total) by mouth every 12 (twelve) hours. 04/12/20   Owens Shark, NP  polyethylene glycol (MIRALAX / GLYCOLAX) 17 g packet Take 17 g by mouth daily.    [provider]  predniSONE (DELTASONE) 50 MG tablet Take 50 mg 13 hours, 7 hours and 1 hour prior to CT scan. Hold decadron on days taking prednisone Patient not taking: Reported on 08/20/2019 07/20/19   Hayden Pedro, PA-C  Testosterone 1.62 % GEL APPLY 2 PUMPS ONCE A DAY AS DIRECTED 01/07/20   [name], MD     Positive ROS: Otherwise negative  All other systems have been reviewed and were otherwise negative with the exception of those mentioned in the HPI and as above.  Physical Exam: Constitutional: Alert, well-appearing, no acute distress Ears: External ears without lesions or tenderness.  Had a mild amount of wax buildup in both ear canals that was cleaned with curettes.  TMs were clear  otherwise. Nasal: External nose without lesions. . Clear nasal passages Oral: Lips and gums without lesions. Tongue and palate mucosa without lesions. Posterior oropharynx clear. Neck: No palpable adenopathy or masses Respiratory: Breathing comfortably  Skin: No facial/neck lesions or rash noted.  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  Cerumen impaction removal  Date/Time: 05/02/2020 4:56 PM Performed by: Rozetta Nunnery, MD Authorized by: Rozetta Nunnery, MD   Consent:    Consent obtained:  Verbal   Consent given by:  Patient   Risks discussed:  Pain  and bleeding Procedure details:    Location:  L ear and R ear   Procedure type: curette   Post-procedure details:    Inspection:  TM intact and canal normal   Hearing quality:  Improved   Patient tolerance of procedure:  Tolerated well, no immediate complications Comments:     TMs are clear bilaterally.    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  Plan: He is cleared for hearing aid use.  Radene Journey, MD

## 2020-05-04 ENCOUNTER — Encounter (INDEPENDENT_AMBULATORY_CARE_PROVIDER_SITE_OTHER): Payer: Self-pay

## 2020-05-04 ENCOUNTER — Ambulatory Visit
Admission: RE | Admit: 2020-05-04 | Discharge: 2020-05-04 | Disposition: A | Discharge status: Home / Self Care | Payer: PPO | Source: Ambulatory Visit | Attending: Internal Medicine | Admitting: Internal Medicine

## 2020-05-04 ENCOUNTER — Other Ambulatory Visit: Payer: Self-pay

## 2020-05-04 DIAGNOSIS — C7931 Secondary malignant neoplasm of brain: Secondary | ICD-10-CM

## 2020-05-04 DIAGNOSIS — J329 Chronic sinusitis, unspecified: Secondary | ICD-10-CM | POA: Diagnosis not present

## 2020-05-04 DIAGNOSIS — G939 Disorder of brain, unspecified: Secondary | ICD-10-CM | POA: Diagnosis not present

## 2020-05-04 DIAGNOSIS — C719 Malignant neoplasm of brain, unspecified: Secondary | ICD-10-CM | POA: Diagnosis not present

## 2020-05-04 DIAGNOSIS — J3489 Other specified disorders of nose and nasal sinuses: Secondary | ICD-10-CM | POA: Diagnosis not present

## 2020-05-04 MED ORDER — GADOBENATE DIMEGLUMINE 529 MG/ML IV SOLN
20.0000 mL | Freq: Once | INTRAVENOUS | Status: AC | PRN
  Administered 2020-05-04: 20 mL via INTRAVENOUS

## 2020-05-04 MED ORDER — SODIUM CHLORIDE 0.9% FLUSH
10.0000 mL | INTRAVENOUS | Status: DC | PRN
  Administered 2020-05-04: 10 mL via INTRAVENOUS

## 2020-05-04 MED ORDER — HEPARIN SOD (PORK) LOCK FLUSH 100 UNIT/ML IV SOLN
500.0000 [IU] | Freq: Once | INTRAVENOUS | Status: AC
  Administered 2020-05-04: 500 [IU] via INTRAVENOUS

## 2020-05-05 ENCOUNTER — Inpatient Hospital Stay: Payer: PPO | Attending: Oncology | Admitting: Internal Medicine

## 2020-05-05 ENCOUNTER — Other Ambulatory Visit: Payer: Self-pay

## 2020-05-05 VITALS — BP 152/63 | HR 62 | Temp 98.0°F | Resp 13 | Ht 70.0 in | Wt 239.1 lb

## 2020-05-05 DIAGNOSIS — C642 Malignant neoplasm of left kidney, except renal pelvis: Secondary | ICD-10-CM | POA: Insufficient documentation

## 2020-05-05 DIAGNOSIS — R569 Unspecified convulsions: Secondary | ICD-10-CM

## 2020-05-05 DIAGNOSIS — C7931 Secondary malignant neoplasm of brain: Secondary | ICD-10-CM | POA: Diagnosis not present

## 2020-05-05 DIAGNOSIS — I10 Essential (primary) hypertension: Secondary | ICD-10-CM | POA: Diagnosis not present

## 2020-05-05 DIAGNOSIS — Z9221 Personal history of antineoplastic chemotherapy: Secondary | ICD-10-CM | POA: Diagnosis not present

## 2020-05-05 DIAGNOSIS — C7951 Secondary malignant neoplasm of bone: Secondary | ICD-10-CM | POA: Diagnosis not present

## 2020-05-05 MED FILL — CABOMETYX 40 MG TABLET: 40 | 30 days supply | Qty: 30 | Fill #2

## 2020-05-05 NOTE — Progress Notes (Signed)
[facility] [place], [place] [address] [phone]   Interval Evaluation  Date of Service: 05/05/20 Patient Name: Phillip [name] Patient MRN: [id number] Patient DOB: [birth year] Provider: [name], MD  Identifying Statement:  Phillip [name] is a 75 y.o. male with Brain metastases [facility]) [C79.31]   Primary Cancer: Renal Cell Carcinoma  CNS Oncologic History: 03/19/19-03/25/19 SRS Treatment: PTV1 Lt Temporal 68mm 27 Gy in 3 fractions PTV2 Lt Occipital 31mm  27 Gy in 3 fractions PTV6 Lt Frontal 56mm  27 Gy in 3 fractions  03/19/19 SRS Treatment:  PTV3 Rt Occipital 49mm  PTV4 Lt Frontal 57mm   PTV5 Rt Frontal 15mm  PTV7 Lt Parietal 27mm  All of these were treated to 20 Gy in 1 fraction  12/02/2015 to 12/12/2015 SBRT Treatment:  The L4 Right spinal was treated to 50 Gy in 5 fractions at 10 Gy per fraction  Interval History:  Phillip [name] presents today for follow up after recent brain MRI.  No new or progressive deficits.  Denies headaches and seizures. Remains active at home and at church..  Takes cabometyx for metastatic renal cell carcinoma.     Medications: Current Outpatient Medications on File Prior to Visit  Medication Sig Dispense Refill  . acetaminophen (TYLENOL) 650 MG CR tablet Take 650 mg by mouth every 8 (eight) hours as needed for pain.    [name] CABOMETYX 40 MG tablet TAKE 1 TABLET (40 MG TOTAL) BY MOUTH DAILY. TAKE 1 HOUR BEFORE OR 2 HOURS AFTER MEALS. 30 tablet 2  . diphenhydrAMINE (BENADRYL) 50 MG tablet Take 1 tablet (50 mg total) by mouth as directed. Take Benadryl 50 mg by mouth 1 hour before CT scan. (Patient not taking: Reported on 08/20/2019) 1 tablet 0  . docusate sodium (COLACE) 100 MG capsule Take 100 mg by mouth 2 (two) times daily.    . hydrocortisone (CORTEF) 10 MG tablet TAKE 2 TABLETS BY MOUTH IN THE MORNING AND 1 TABLET IN THE EVENING 90 tablet 3  . ibuprofen (ADVIL,MOTRIN) 200 MG  tablet Take 200 mg by mouth every 8 (eight) hours as needed for fever, headache, mild pain, moderate pain or cramping.     . levETIRAcetam (KEPPRA) 500 MG tablet TAKE 1 TABLET BY MOUTH 2 TIMES DAILY 60 tablet 2  . lidocaine-prilocaine (EMLA) cream APPLY TO PORTACATH 1 HOUR PRIOR TO USE AS NEEDED 30 g 2  . LORazepam (ATIVAN) 0.5 MG tablet Take 1-2 tablets (0.5-1 mg total) by mouth every 6 (six) hours as needed for anxiety. (Patient not taking: Reported on 08/20/2019) 60 tablet 2  . LORazepam (ATIVAN) 1 MG tablet Take 1 tablet (1 mg total) by mouth as directed. Take 1 hour prior to MRI (Patient not taking: Reported on 08/20/2019) 1 tablet 0  . losartan-hydrochlorothiazide (HYZAAR) 100-12.5 MG tablet Take 1 tablet by mouth daily.     . mirtazapine (REMERON) 30 MG tablet Take 1 tablet (30 mg total) by mouth at bedtime. 90 tablet 1  . Multiple Vitamin (MULTIVITAMIN ADULT PO) Take 1 tablet by mouth daily. [employer] product    . ondansetron (ZOFRAN) 8 MG tablet TAKE 1 TABLET BY MOUTH EVERY 8 HOURS AS NEEDED FOR NAUSEA AND VOMITING 90 tablet 2  . oxyCODONE (OXYCONTIN) 10 mg 12 hr tablet Take 2 tablets (20 mg total) by mouth every 12 (twelve) hours. 120 tablet 0  . polyethylene glycol (MIRALAX / GLYCOLAX) 17 g packet Take 17 g by  mouth daily.    . predniSONE (DELTASONE) 50 MG tablet Take 50 mg 13 hours, 7 hours and 1 hour prior to CT scan. Hold decadron on days taking prednisone (Patient not taking: Reported on 08/20/2019) 3 tablet 3  . Testosterone 1.62 % GEL APPLY 2 PUMPS ONCE A DAY AS DIRECTED 75 g 3   Current Facility-Administered Medications on File Prior to Visit  Medication Dose Route Frequency Provider Last Rate Last Admin  . sodium chloride flush (NS) 0.9 % injection 10 mL  10 mL Intravenous PRN Ladene Artist, MD   10 mL at 06/22/16 [id number]    Allergies:  Allergies  Allergen Reactions  . Contrast Media [Iodinated Diagnostic Agents] Rash   Past Medical History:  Past Medical History:  Diagnosis  Date  . Anxiety   . Brain cancer ([name])   . Chronic kidney disease    renal cancer  . Constipation   . Depression   . Hypertension   . Low testosterone in male   . 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 Seizures ([name])    last seizure 01/2019 - controlled since on keppra  . Wears glasses    Past Surgical History:  Past Surgical History:  Procedure Laterality Date  . insertion port-a-cath  2018  . IR GENERIC HISTORICAL  11/18/2015   IR FLUORO GUIDED NEEDLE PLC ASPIRATION/INJECTION LOC 11/18/2015 Julieanne Cotton, MD [facility]INTER[facility]  . IR GENERIC HISTORICAL  05/16/2016   IR FLUORO GUIDE PORT INSERTION RIGHT 05/16/2016 Malachy Moan, MD [facility]INTER[facility]  . IR GENERIC HISTORICAL  05/16/2016   IR US GUIDE VASC ACCESS RIGHT 05/16/2016 Malachy Moan, MD [facility]  . RADIOLOGY WITH ANESTHESIA N/A 06/16/2019   Procedure: MRI BRAIN WITH AND WITHOUT CONTRAST WITH ANESTHESIA;  Surgeon: Radiologist, Medication, MD;  [facility];  Service: Radiology;  Laterality: N/A;   Social History:  Social History   Socioeconomic History  . Marital status: Single    Spouse name: Not on file  . Number of children: Not on file  . Years of education: Not on file  . Highest education level: Not on file  Occupational History  . Not on file  Tobacco Use  . Smoking status: Never Smoker  . Smokeless tobacco: Never Used  Vaping Use  . Vaping Use: Never used  Substance and Sexual Activity  . Alcohol use: Not Currently    Alcohol/week: 1.0 standard drink    Types: 1 Glasses of wine per week  . Drug use: Yes    Types: Marijuana    Comment: Last use 06/13/19  . Sexual activity: Not Currently  Other Topics Concern  . Not on file  Social History Narrative  . Not on file   Social Determinants of Health   Financial Resource Strain: Not on file  Food Insecurity: Not on file  Transportation Needs: Not on file  Physical Activity: Not on file  Stress: Not on file  Social Connections: Not on  file  Intimate Partner Violence: Not on file   Family History:  Family History  Problem Relation Age of Onset  . Cancer Grandchild        lymphoma    Review of Systems: Constitutional: Doesn't report fevers, chills or abnormal weight loss Eyes: Doesn't report blurriness of vision Ears, nose, mouth, throat, and face: Doesn't report sore throat Respiratory: Doesn't report cough, dyspnea or wheezes Cardiovascular: Doesn't report palpitation, chest discomfort  Gastrointestinal:  Doesn't report nausea, constipation, diarrhea GU: Doesn't report  incontinence Skin: Doesn't report skin rashes Neurological: Per HPI Musculoskeletal: Doesn't report joint pain Behavioral/Psych: Doesn't report anxiety  Physical Exam: Vitals:   05/05/20 1115  BP: (!) 152/63  Pulse: 62  Resp: 13  Temp: 98 F (36.7 C)  SpO2: 99%   KPS: 90. General: Alert, cooperative, pleasant, in no acute distress Head: Normal EENT: No conjunctival injection or scleral icterus.  Lungs: Resp effort normal Cardiac: Regular rate Abdomen: Non-distended abdomen Skin: No rashes cyanosis or petechiae. Extremities: No clubbing or edema  Neurologic Exam: Mental Status: Awake, alert, attentive to examiner. Oriented to self and environment. Language is fluent with intact comprehension.  Cranial Nerves: Visual acuity is grossly normal. Visual fields are full. Extra-ocular movements intact. No ptosis. Face is symmetric Motor: Tone and bulk are normal. Power is full in both arms and legs. Reflexes are symmetric, no pathologic reflexes present.  Sensory: Intact to light touch Gait: Normal.   Labs: I have reviewed the data as listed    Component Value Date/Time   NA 137 03/31/2020 1205   NA 140 02/21/2017 1128   K 4.2 03/31/2020 1205   K 4.3 02/21/2017 1128   CL 101 03/31/2020 1205   CO2 29 03/31/2020 1205   CO2 26 02/21/2017 1128   GLUCOSE 99 03/31/2020 1205   GLUCOSE 115 02/21/2017 1128   BUN 17 03/31/2020 1205    BUN 16.6 02/21/2017 1128   CREATININE 0.85 03/31/2020 1205   CREATININE 0.8 02/21/2017 1128   CALCIUM 9.0 03/31/2020 1205   CALCIUM 9.4 02/21/2017 1128   PROT 6.5 03/31/2020 1205   PROT 7.5 02/21/2017 1128   ALBUMIN 3.5 03/31/2020 1205   ALBUMIN 4.2 02/21/2017 1128   AST 21 03/31/2020 1205   AST 20 02/21/2017 1128   ALT 28 03/31/2020 1205   ALT 43 02/21/2017 1128   ALKPHOS 71 03/31/2020 1205   ALKPHOS 104 02/21/2017 1128   BILITOT 0.3 03/31/2020 1205   BILITOT 0.36 02/21/2017 1128   GFRNONAA >60 03/31/2020 1205   GFRAA >60 11/05/2019 0957   Lab Results  Component Value Date   WBC 8.7 03/31/2020   NEUTROABS 5.5 03/31/2020   HGB 13.6 03/31/2020   HCT 41.8 03/31/2020   MCV 91.1 03/31/2020   PLT 219 03/31/2020    Imaging:  MR BRAIN W WO CONTRAST  Result Date: 05/04/2020 CLINICAL DATA:  Brain/CNS neoplasm.  Assess treatment response. EXAM: MRI HEAD WITHOUT AND WITH CONTRAST TECHNIQUE: Multiplanar, multiecho pulse sequences of the brain and surrounding structures were obtained without and with intravenous contrast. CONTRAST:  65mL MULTIHANCE GADOBENATE DIMEGLUMINE 529 MG/ML IV SOLN COMPARISON:  MRI of the brain January 29, 2020. FINDINGS: Brain: No acute infarction, hemorrhage, hydrocephalus or extra-axial collection. 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 Enhancing lesions consistent with metastatic disease are again identified and labeled on series 11. Stable or decreased size lesion: Left cerebellar hemisphere, 1.5 mm, unchanged, image 27; Right cerebellar hemisphere, 1.5 mm, unchanged, image 43; Left occipital lobe, 10 mm (12 mm on prior) image 56; Left parietal lobe, 6 mm, unchanged, image 110; Medial left frontal lobe 10 mm, unchanged, image 119; Left superior frontal gyrus, 11 mm, unchanged, image 140. Increased size: Right occipital lobe, 9 mm (7 mm on prior), image 70. No new lesion identified Vascular: Normal flow voids. Skull and upper cervical spine: Normal marrow  signal. Sinuses/Orbits: Mucosal thickening of the bilateral ethmoid cells, right frontal, left sphenoid and bilateral maxillary sinuses. The orbits are maintained. IMPRESSION: 1. Increased size of a  right occipital lobe lesion, now 9 mm (7 mm). 2. Other metastatic lesions are stable or decreased in size. No new lesion identified. 3. Paranasal sinus disease. Electronically Signed   By: Pedro Earls M.D.   On: 05/04/2020 16:17    [facility] Clinician Interpretation: I have personally reviewed the radiological images as listed.  My interpretation, in the context of the patient's clinical presentation, is likely treatment effect   Assessment/Plan Brain metastases [facility]) [C79.31]  Phillip [name] is clinically stable today.  MRI demonstrates improvement in 2 of 3 previously treated metastases identified as progressive on prior scan.  The one lesion in the right occipital lobe has grown modestly.  Etiology still consistent with likely radio-inflammatory effect.   Otherwise continues to demonstrate good cognitive and motor function.  He should continue Keppra $RemoveBeforeD'500mg'[id number]$  BID for seizure prevention.  We ask that [place] return to clinic in 3 months following next brain MRI, or sooner as needed.  We appreciate the opportunity to participate in the care of Lupus.   All questions were answered. The patient knows to call the clinic with any problems, questions or concerns. No barriers to learning were detected.  I have spent a total of 30 minutes of face-to-face and non-face-to-face time, excluding clinical staff time, preparing to see patient, ordering tests and/or medications, counseling the patient, and independently interpreting results and communicating results to the patient/family/caregiver    [name], MD Medical Director of Neuro-Oncology [facility] at [place] 05/05/20 11:04 AM

## 2020-05-07 ENCOUNTER — Other Ambulatory Visit: Payer: Self-pay | Admitting: Nurse Practitioner

## 2020-05-07 ENCOUNTER — Other Ambulatory Visit: Payer: Self-pay | Admitting: Oncology

## 2020-05-07 DIAGNOSIS — C649 Malignant neoplasm of unspecified kidney, except renal pelvis: Secondary | ICD-10-CM

## 2020-05-07 MED FILL — TESTOSTERONE 1.62 % GEL: 1.62 | 30 days supply | Qty: 75 | Fill #3

## 2020-05-07 MED FILL — levETIRAcetam 500 MG TABS: 500 | 30 days supply | Qty: 60 | Fill #1

## 2020-05-07 MED FILL — LOSARTAN-HCTZ 100-12.5 MG T: 100-12.5 | 30 days supply | Qty: 30 | Fill #1

## 2020-05-09 ENCOUNTER — Other Ambulatory Visit: Payer: Self-pay | Admitting: Nurse Practitioner

## 2020-05-09 ENCOUNTER — Other Ambulatory Visit: Payer: Self-pay

## 2020-05-09 ENCOUNTER — Inpatient Hospital Stay: Payer: PPO

## 2020-05-09 DIAGNOSIS — E785 Hyperlipidemia, unspecified: Secondary | ICD-10-CM | POA: Diagnosis not present

## 2020-05-09 DIAGNOSIS — C649 Malignant neoplasm of unspecified kidney, except renal pelvis: Secondary | ICD-10-CM | POA: Diagnosis not present

## 2020-05-09 DIAGNOSIS — I10 Essential (primary) hypertension: Secondary | ICD-10-CM | POA: Diagnosis not present

## 2020-05-09 MED ORDER — OXYCODONE HCL ER 10 MG PO T12A: 20.0000 mg | EXTENDED_RELEASE_TABLET | Freq: Two times a day (BID) | ORAL | 0 refills | Status: DC

## 2020-05-09 NOTE — Telephone Encounter (Signed)
Refill request

## 2020-05-13 ENCOUNTER — Other Ambulatory Visit: Payer: Self-pay | Admitting: Radiation Therapy

## 2020-05-16 ENCOUNTER — Other Ambulatory Visit: Payer: Self-pay

## 2020-05-16 ENCOUNTER — Inpatient Hospital Stay: Payer: PPO

## 2020-05-16 ENCOUNTER — Inpatient Hospital Stay: Payer: PPO | Admitting: Oncology

## 2020-05-16 VITALS — BP 149/56 | HR 60 | Temp 97.8°F | Resp 18 | Ht 70.0 in | Wt 233.3 lb

## 2020-05-16 DIAGNOSIS — C649 Malignant neoplasm of unspecified kidney, except renal pelvis: Secondary | ICD-10-CM

## 2020-05-16 DIAGNOSIS — C642 Malignant neoplasm of left kidney, except renal pelvis: Secondary | ICD-10-CM | POA: Diagnosis not present

## 2020-05-16 DIAGNOSIS — Z95828 Presence of other vascular implants and grafts: Secondary | ICD-10-CM

## 2020-05-16 LAB — CBC WITH DIFFERENTIAL (CANCER CENTER ONLY)
Abs Immature Granulocytes: 0.03 10*3/uL (ref 0.00–0.07)
Basophils Absolute: 0 10*3/uL (ref 0.0–0.1)
Basophils Relative: 0 %
Eosinophils Absolute: 0.6 10*3/uL — ABNORMAL HIGH (ref 0.0–0.5)
Eosinophils Relative: 7 %
HCT: 39.6 % (ref 39.0–52.0)
Hemoglobin: 12.7 g/dL — ABNORMAL LOW (ref 13.0–17.0)
Immature Granulocytes: 0 %
Lymphocytes Relative: 25 %
Lymphs Abs: 2.3 10*3/uL (ref 0.7–4.0)
MCH: 29.1 pg (ref 26.0–34.0)
MCHC: 32.1 g/dL (ref 30.0–36.0)
MCV: 90.6 fL (ref 80.0–100.0)
Monocytes Absolute: 0.6 10*3/uL (ref 0.1–1.0)
Monocytes Relative: 6 %
Neutro Abs: 5.6 10*3/uL (ref 1.7–7.7)
Neutrophils Relative %: 62 %
Platelet Count: 222 10*3/uL (ref 150–400)
RBC: 4.37 MIL/uL (ref 4.22–5.81)
RDW: 15.3 % (ref 11.5–15.5)
WBC Count: 9.1 10*3/uL (ref 4.0–10.5)
nRBC: 0 % (ref 0.0–0.2)

## 2020-05-16 LAB — CMP (CANCER CENTER ONLY)
ALT: 43 U/L (ref 0–44)
AST: 21 U/L (ref 15–41)
Albumin: 3.4 g/dL — ABNORMAL LOW (ref 3.5–5.0)
Alkaline Phosphatase: 79 U/L (ref 38–126)
Anion gap: 9 (ref 5–15)
BUN: 14 mg/dL (ref 8–23)
CO2: 26 mmol/L (ref 22–32)
Calcium: 8.9 mg/dL (ref 8.9–10.3)
Chloride: 100 mmol/L (ref 98–111)
Creatinine: 0.8 mg/dL (ref 0.61–1.24)
GFR, Estimated: >60 mL/min (ref >60)
Glucose, Bld: 110 mg/dL — ABNORMAL HIGH (ref 70–99)
Potassium: 4.4 mmol/L (ref 3.5–5.1)
Sodium: 135 mmol/L (ref 135–145)
Total Bilirubin: 0.3 mg/dL (ref 0.3–1.2)
Total Protein: 6.5 g/dL (ref 6.5–8.1)

## 2020-05-16 LAB — MAGNESIUM: Magnesium: 2.2 mg/dL (ref 1.7–2.4)

## 2020-05-16 MED ORDER — SODIUM CHLORIDE 0.9% FLUSH
10.0000 mL | Freq: Once | INTRAVENOUS | Status: AC
  Administered 2020-05-16: 10 mL
  Filled 2020-05-16: qty 10

## 2020-05-16 MED ORDER — HEPARIN SOD (PORK) LOCK FLUSH 100 UNIT/ML IV SOLN
500.0000 [IU] | Freq: Once | INTRAVENOUS | Status: AC
Start: 2020-05-16 — End: 2020-05-16
  Administered 2020-05-16: 500 [IU]
  Filled 2020-05-16: qty 5

## 2020-05-16 MED ORDER — ZOLEDRONIC ACID 4 MG/100ML IV SOLN: 4.0000 mg | Freq: Once | INTRAVENOUS | Status: DC

## 2020-05-16 NOTE — Progress Notes (Signed)
[facility] OFFICE PROGRESS NOTE   Diagnosis: Renal cell carcinoma  INTERVAL HISTORY:   Phillip [name] returns as scheduled.  He continues cabozantinib.  No diarrhea.  He continues to have mild anorexia.  Stable pain at the back and right leg.  No seizure. He saw Dr. Mickeal Skinner 05/05/2020 after a repeat brain MRI.  The MRI did not reveal evidence of disease progression.  Objective:  Vital signs in last 24 hours:  Blood pressure (!) 149/56, pulse 60, temperature 97.8 F (36.6 C), temperature source Tympanic, resp. rate 18, height 5\' 10"  (1.778 m), weight 233 lb 4.8 oz (105.8 kg), SpO2 100 %.    HEENT: No thrush or ulcers Resp: Lungs clear bilaterally Cardio: Regular rate and rhythm GI: No hepatosplenomegaly Vascular: No leg edema Neuro: Alert and oriented Skin: Acne type rash at the upper chest  Portacath/PICC-without erythema  Lab Results:  Lab Results  Component Value Date   WBC 9.1 05/16/2020   HGB 12.7 (L) 05/16/2020   HCT 39.6 05/16/2020   MCV 90.6 05/16/2020   PLT 222 05/16/2020   NEUTROABS 5.6 05/16/2020    CMP  Lab Results  Component Value Date   NA 137 03/31/2020   K 4.2 03/31/2020   CL 101 03/31/2020   CO2 29 03/31/2020   GLUCOSE 99 03/31/2020   BUN 17 03/31/2020   CREATININE 0.85 03/31/2020   CALCIUM 9.0 03/31/2020   PROT 6.5 03/31/2020   ALBUMIN 3.5 03/31/2020   AST 21 03/31/2020   ALT 28 03/31/2020   ALKPHOS 71 03/31/2020   BILITOT 0.3 03/31/2020   GFRNONAA >60 03/31/2020   GFRAA >60 11/05/2019    Lab Results  Component Value Date   CEA1 1.77 01/04/2016     Medications: I have reviewed the patient's current medications.   Assessment/Plan: 1. Metastatic renal cell carcinoma   L4 mass with extraosseous extension, L4 nerve compression  Biopsy of the L4 mass 11/18/2015 confirmed metastatic renal cell carcinoma, clear cell type  CTs of the chest, abdomen, and pelvis 11/18/2015-right lower lobe nodule, expansile lytic lesion  at the right 11th rib/costal vertebral junction, left renal mass, expansile lesion involving the L4 vertebra, lytic lesion at the left acetabulum, and a low-attenuation liver lesion  Initiation of SRS to L4 12/02/2015, Completed 12/12/2015  Initiation of Pazopanib11/04/2015  Pazopanibplaced on hold 02/06/2016 secondary to elevated liver enzymes  Pazopanibresumed 03/07/2016 at a dose of 400 mg daily  Pazopanibdiscontinued 03/19/2016 secondary to elevated liver enzymes  Restaging CTs 04/02/2016-stable left renal mass, decreased soft tissue component associated with the L4 metastasis, increased soft tissue component associated with the right 11th rib metastasis with increased T11 bony destruction, increased sclerosis at the left acetabulum lesion  Cycle 1 nivolumab 04/12/2016  Cycle 2 nivolumab 04/26/2016  Cycle 3 nivolumab03/16/2018  Cycle 4 nivolumab 05/24/2016  Cycle 5 nivolumab 06/08/2016  MRI lumbar spine 06/21/2016-unchanged tumor at L3, increased size of retroperitoneal lymph nodes compared to a CT from 04/02/2016  Cycle 6 nivolumab 06/22/2016  CTs chest, abdomen, and pelvis 07/04/2016-enlargement of the left renal mass, right adrenal nodule, left hilar and peritoneal lymph nodes, enlargement of left acetabular lesion. Stable lung nodules.  Cycle 7 nivolumab05/12/2016   Cycle 8 nivolumab 07/20/2016  Cycle 9 nivolumab 08/02/2016  Cycle 10 nivolumab 08/20/2016  Restaging CT 09/03/2016 evaluation with stable disease  Cycle 11 nivolumab 09/05/2016  Cycle 12 nivolumab 09/19/2016  Cycle 13 nivolumab 10/03/2016  Cycle 14 nivolumab 10/17/2016  Cycle 15 nivolumab 10/31/2016  Cycle 16 nivolumab 11/14/2016 (  changed to monthly schedule)  Cycle 17nivolumab10/24/2018  CTs 01/21/2017-increased left renal mass, increased size of adrenal metastases, increased lytic bone lesions, increased left lung nodule, persistent tumor at L4 with probable epidural  component  Initiation of Cabozantinib12/04/2016  Restaging CTs 05/30/2017-decreased size of left hilar mass, left renal mass, retroperitoneal adenopathy, and adrenal metastasis. Healing bone lesions.  Cabozantinib continued  CTs 10/21/2017-interval enlargement left hilar lymph node; stable rib lesions; stable mass left renal cortex; stable mildly nodular adrenal glands; stable lytic lesions within the pelvis and spine.  Cabozantinib continued  CTs 02/21/2018-enlargement of an AP window lymph node. Mildly enlarged left hilar lymph node is unchanged. Primary renal cell carcinoma involving the upper pole of the left kidney appears similar. Stable enlarged right periaortic lymph node adjacent to the renal vessels. Stable multifocal bony metastatic disease.  Cabozantinibcontinued  CTs 07/29/2018-stable 15 mm AP window nodes; stable left hilar node; subcarinal node slightly larger; right periaortic node 16 mm, previously 12 mm; portacaval node 24 mm, previously 16 mm; 15 mm node superior to the pancreatic head has enlarged; interval increase nodularity of both adrenal glands; stable left kidney mass; multifocal bony metastatic disease not significantly changed.  Cabozantinib continued  CTs 11/06/2018-moderate improvement in thoracic adenopathy; minimal improvement abdominal adenopathy; left kidney upper pole mass and various lytic expansile bone lesions stable; mild increase in nodularity of left adrenal gland; right adrenal gland nodularity stable.  Cabozantinib continued  Clinical evidence of partial seizure activity December 2020  CT head 02/25/2019-extensive vasogenic edema, left greater than right. Peripheral enhancing masses consistent with metastases. No hemorrhage.  MRI brain 03/11/2019-7 enhancing brain masses consistent with metastatic disease  SRS to 7 brain lesions, treatment given 03/19/2019, 03/23/2019, and 03/25/2019  CTs 04/09/2019-decrease in left renal mass, bilateral  adrenal nodules, AP window and porta hepatic adenopathy. New 9 mm right lower lobe nodule. Improved lytic lesion of the left acetabulum. Other bone lesions are stable.  Cabozantinib continued  MRI brain 07/17/2019-resolution of 4 mm treated lesion in the right frontal cortex, 6 remaining treated lesions have decreased in size, new punctate metastasis in the superior right cerebellum  SRS to right cerebellar lesion 07/30/2019  CTs 08/17/2019-previously noted right lower lobe nodule resolved, stable left kidney mass, mixed lytic/sclerotic bone lesions in the thoracolumbar spine, right posterior ribs, and left acetabulum-unchanged, no evidence of progressive disease  Cabozantinib continued  MRI brain 10/23/2019-stable to slight decrease in size of multiple enhancing intracranial lesions. Slight increase in surrounding edema in the medial left frontal lobe. No new lesions present.  MRI brain 01/29/2020-multiple enhancing brain lesions, some hemorrhagic, some with mild enlargement-treatment effect?  CTs 02/08/2020-no thoracic metastases, enlargement of the left renal mass, enlargement of lytic lesion at T9, other lytic lesions unchanged  MRI brain 05/04/2020-no new lesions, slight enlargement of a right occipital lesion, other lesions are stable or decreased in size 2. Pain secondary to #1-managed by Dr. Lovenia Shuck.Improved 3. Hypertension 3. Elevated transaminases 02/06/2016-Pazopanibplaced on hold  Liver enzymes normal 03/07/2016 5. Port-A-Cath placement 05/16/2016 6. Malaise/anorexia 09/05/2016. Cortisol and testosterone levels low. Hydrocortisone and testosterone replacement initiated. 7. Conjunctival/scleral erythema 09/19/2016-resolved with steroid eyedrops 8. 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 9. Hypercalcemia status post Zometa 01/23/2017-resolved 10. Pruritic rash following IV contrast 10/21/2017;rash following IV  contrast 02/21/2018 despite prednisone/Benadryl premedication 11. Brief episodes of expressive aphasia October and December 2020      Disposition: Mr. Levitz appears stable.  The restaging brain MRI earlier this month  revealed no clear evidence of disease progression.  He continues cabozantinib.  He will return for an office and lab visit in 6 weeks.  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    Betsy Coder, MD  05/16/2020  12:53 PM

## 2020-05-17 ENCOUNTER — Telehealth: Payer: Self-pay | Admitting: Oncology

## 2020-05-17 NOTE — Telephone Encounter (Signed)
Scheduled appt per 3/21 los - left message for patient with appt date and time

## 2020-05-18 ENCOUNTER — Encounter: Payer: Self-pay | Admitting: *Deleted

## 2020-05-18 NOTE — Progress Notes (Signed)
Faxed PA form, office note and lab results to [employer] [employer] re: Testosterone 1.62% gel. Reference #[id number].

## 2020-05-20 ENCOUNTER — Other Ambulatory Visit: Payer: Self-pay | Admitting: *Deleted

## 2020-05-20 ENCOUNTER — Other Ambulatory Visit: Payer: Self-pay | Admitting: Oncology

## 2020-05-20 MED ORDER — TESTOSTERONE 1.62 % TD GEL: TRANSDERMAL | 3 refills | Status: DC

## 2020-05-24 ENCOUNTER — Other Ambulatory Visit (HOSPITAL_COMMUNITY): Payer: Self-pay

## 2020-06-02 ENCOUNTER — Other Ambulatory Visit (HOSPITAL_COMMUNITY): Payer: Self-pay

## 2020-06-07 ENCOUNTER — Other Ambulatory Visit (HOSPITAL_COMMUNITY): Payer: Self-pay

## 2020-06-07 ENCOUNTER — Other Ambulatory Visit: Payer: Self-pay

## 2020-06-07 ENCOUNTER — Other Ambulatory Visit: Payer: Self-pay | Admitting: Nurse Practitioner

## 2020-06-07 ENCOUNTER — Other Ambulatory Visit: Payer: Self-pay | Admitting: Oncology

## 2020-06-07 DIAGNOSIS — C649 Malignant neoplasm of unspecified kidney, except renal pelvis: Secondary | ICD-10-CM

## 2020-06-07 MED ORDER — OXYCODONE HCL ER 10 MG PO T12A
EXTENDED_RELEASE_TABLET | Freq: Two times a day (BID) | ORAL | 0 refills | Status: DC
  Filled 2020-06-07: qty 120, 30d supply, fill #0

## 2020-06-07 MED ORDER — CABOMETYX 40 MG PO TABS
ORAL_TABLET | ORAL | 1 refills | Status: DC
  Filled 2020-06-07: qty 30, 30d supply, fill #0
  Filled 2020-07-08: qty 30, 30d supply, fill #1

## 2020-06-07 MED FILL — Hydrocortisone Tab 10 MG: ORAL | 30 days supply | Qty: 90 | Fill #0 | Status: AC

## 2020-06-07 MED FILL — Levetiracetam Tab 500 MG: ORAL | 30 days supply | Qty: 60 | Fill #0 | Status: AC

## 2020-06-07 NOTE — Telephone Encounter (Signed)
refill 

## 2020-06-08 ENCOUNTER — Other Ambulatory Visit (HOSPITAL_COMMUNITY): Payer: Self-pay

## 2020-06-09 ENCOUNTER — Other Ambulatory Visit (HOSPITAL_COMMUNITY): Payer: Self-pay

## 2020-06-13 ENCOUNTER — Other Ambulatory Visit: Payer: Self-pay | Admitting: Oncology

## 2020-06-13 ENCOUNTER — Other Ambulatory Visit (HOSPITAL_COMMUNITY): Payer: Self-pay

## 2020-06-13 MED ORDER — LEVETIRACETAM 500 MG PO TABS
ORAL_TABLET | Freq: Two times a day (BID) | ORAL | 2 refills | Status: DC
  Filled 2020-06-13: qty 60, fill #0
  Filled 2020-07-08: qty 60, 30d supply, fill #0
  Filled 2020-08-09: qty 60, 30d supply, fill #1
  Filled 2020-09-06: qty 60, 30d supply, fill #2

## 2020-06-14 ENCOUNTER — Other Ambulatory Visit (HOSPITAL_COMMUNITY): Payer: Self-pay

## 2020-06-20 ENCOUNTER — Other Ambulatory Visit (HOSPITAL_COMMUNITY): Payer: Self-pay

## 2020-06-20 DIAGNOSIS — K610 Anal abscess: Secondary | ICD-10-CM | POA: Diagnosis not present

## 2020-06-20 MED ORDER — AMOXICILLIN-POT CLAVULANATE 875-125 MG PO TABS
ORAL_TABLET | ORAL | 0 refills | Status: DC
  Filled 2020-06-20: qty 14, 7d supply, fill #0

## 2020-06-22 DIAGNOSIS — C649 Malignant neoplasm of unspecified kidney, except renal pelvis: Secondary | ICD-10-CM | POA: Diagnosis not present

## 2020-06-22 DIAGNOSIS — E785 Hyperlipidemia, unspecified: Secondary | ICD-10-CM | POA: Diagnosis not present

## 2020-06-22 DIAGNOSIS — I10 Essential (primary) hypertension: Secondary | ICD-10-CM | POA: Diagnosis not present

## 2020-06-23 ENCOUNTER — Other Ambulatory Visit (HOSPITAL_COMMUNITY): Payer: Self-pay

## 2020-06-23 ENCOUNTER — Other Ambulatory Visit: Payer: Self-pay | Admitting: *Deleted

## 2020-06-23 ENCOUNTER — Other Ambulatory Visit: Payer: Self-pay

## 2020-06-23 DIAGNOSIS — C649 Malignant neoplasm of unspecified kidney, except renal pelvis: Secondary | ICD-10-CM

## 2020-06-24 DIAGNOSIS — K610 Anal abscess: Secondary | ICD-10-CM | POA: Diagnosis not present

## 2020-06-25 ENCOUNTER — Other Ambulatory Visit: Payer: Self-pay

## 2020-06-25 ENCOUNTER — Other Ambulatory Visit (HOSPITAL_COMMUNITY): Payer: Self-pay

## 2020-06-28 ENCOUNTER — Inpatient Hospital Stay: Payer: PPO | Attending: Oncology | Admitting: Oncology

## 2020-06-28 ENCOUNTER — Other Ambulatory Visit (HOSPITAL_BASED_OUTPATIENT_CLINIC_OR_DEPARTMENT_OTHER): Payer: Self-pay

## 2020-06-28 ENCOUNTER — Ambulatory Visit: Payer: PPO | Attending: Internal Medicine

## 2020-06-28 ENCOUNTER — Inpatient Hospital Stay: Payer: PPO

## 2020-06-28 ENCOUNTER — Other Ambulatory Visit: Payer: Self-pay

## 2020-06-28 ENCOUNTER — Other Ambulatory Visit (HOSPITAL_COMMUNITY): Payer: Self-pay

## 2020-06-28 VITALS — BP 139/60 | HR 53 | Temp 97.8°F | Resp 18 | Ht 70.0 in | Wt 232.0 lb

## 2020-06-28 DIAGNOSIS — Z23 Encounter for immunization: Secondary | ICD-10-CM

## 2020-06-28 DIAGNOSIS — Z95828 Presence of other vascular implants and grafts: Secondary | ICD-10-CM

## 2020-06-28 DIAGNOSIS — C649 Malignant neoplasm of unspecified kidney, except renal pelvis: Secondary | ICD-10-CM

## 2020-06-28 DIAGNOSIS — C7951 Secondary malignant neoplasm of bone: Secondary | ICD-10-CM | POA: Insufficient documentation

## 2020-06-28 DIAGNOSIS — C7931 Secondary malignant neoplasm of brain: Secondary | ICD-10-CM | POA: Diagnosis not present

## 2020-06-28 DIAGNOSIS — C642 Malignant neoplasm of left kidney, except renal pelvis: Secondary | ICD-10-CM | POA: Insufficient documentation

## 2020-06-28 DIAGNOSIS — C7972 Secondary malignant neoplasm of left adrenal gland: Secondary | ICD-10-CM | POA: Insufficient documentation

## 2020-06-28 LAB — CMP (CANCER CENTER ONLY)
ALT: 52 U/L — ABNORMAL HIGH (ref 0–44)
AST: 34 U/L (ref 15–41)
Albumin: 3.8 g/dL (ref 3.5–5.0)
Alkaline Phosphatase: 74 U/L (ref 38–126)
Anion gap: 9 (ref 5–15)
BUN: 15 mg/dL (ref 8–23)
CO2: 26 mmol/L (ref 22–32)
Calcium: 8.8 mg/dL — ABNORMAL LOW (ref 8.9–10.3)
Chloride: 101 mmol/L (ref 98–111)
Creatinine: 0.77 mg/dL (ref 0.61–1.24)
GFR, Estimated: >60 mL/min (ref >60)
Glucose, Bld: 102 mg/dL — ABNORMAL HIGH (ref 70–99)
Potassium: 4.1 mmol/L (ref 3.5–5.1)
Sodium: 136 mmol/L (ref 135–145)
Total Bilirubin: 0.3 mg/dL (ref 0.3–1.2)
Total Protein: 6.2 g/dL — ABNORMAL LOW (ref 6.5–8.1)

## 2020-06-28 LAB — MAGNESIUM: Magnesium: 2.2 mg/dL (ref 1.7–2.4)

## 2020-06-28 LAB — CBC WITH DIFFERENTIAL (CANCER CENTER ONLY)
Abs Immature Granulocytes: 0.05 10*3/uL (ref 0.00–0.07)
Basophils Absolute: 0.1 10*3/uL (ref 0.0–0.1)
Basophils Relative: 0 %
Eosinophils Absolute: 0.9 10*3/uL — ABNORMAL HIGH (ref 0.0–0.5)
Eosinophils Relative: 8 %
HCT: 40.5 % (ref 39.0–52.0)
Hemoglobin: 13.1 g/dL (ref 13.0–17.0)
Immature Granulocytes: 0 %
Lymphocytes Relative: 21 %
Lymphs Abs: 2.3 10*3/uL (ref 0.7–4.0)
MCH: 29.2 pg (ref 26.0–34.0)
MCHC: 32.3 g/dL (ref 30.0–36.0)
MCV: 90.2 fL (ref 80.0–100.0)
Monocytes Absolute: 0.5 10*3/uL (ref 0.1–1.0)
Monocytes Relative: 5 %
Neutro Abs: 7.4 10*3/uL (ref 1.7–7.7)
Neutrophils Relative %: 66 %
Platelet Count: 342 10*3/uL (ref 150–400)
RBC: 4.49 MIL/uL (ref 4.22–5.81)
RDW: 15.4 % (ref 11.5–15.5)
WBC Count: 11.2 10*3/uL — ABNORMAL HIGH (ref 4.0–10.5)
nRBC: 0 % (ref 0.0–0.2)

## 2020-06-28 MED ORDER — PFIZER-BIONT COVID-19 VAC-TRIS 30 MCG/0.3ML IM SUSP
INTRAMUSCULAR | 0 refills | Status: DC
  Filled 2020-06-28: qty 0.3, 1d supply, fill #0

## 2020-06-28 MED ORDER — SODIUM CHLORIDE 0.9% FLUSH
10.0000 mL | Freq: Once | INTRAVENOUS | Status: AC
  Administered 2020-06-28: 10 mL
  Filled 2020-06-28: qty 10

## 2020-06-28 MED ORDER — LOSARTAN POTASSIUM-HCTZ 100-12.5 MG PO TABS
ORAL_TABLET | ORAL | 3 refills | Status: DC
  Filled 2020-06-28: qty 90, 90d supply, fill #0

## 2020-06-28 MED ORDER — HEPARIN SOD (PORK) LOCK FLUSH 100 UNIT/ML IV SOLN
500.0000 [IU] | Freq: Once | INTRAVENOUS | Status: AC
Start: 2020-06-28 — End: 2020-06-28
  Administered 2020-06-28: 500 [IU]
  Filled 2020-06-28: qty 5

## 2020-06-28 NOTE — Progress Notes (Signed)
[facility] OFFICE PROGRESS NOTE   Diagnosis: Renal cell carcinoma  INTERVAL HISTORY:   Mr. Buller returns as scheduled.  He continues cabozantinib.  No rash or diarrhea.  He feels well.  He continues to have mild anorexia.  Stable back and right leg pain.  He continues OxyContin and oxycodone.  Objective:  Vital signs in last 24 hours:  Blood pressure 139/60, pulse (!) 53, temperature 97.8 F (36.6 C), temperature source Oral, resp. rate 18, height 5\' 10"  (1.778 m), weight 232 lb (105.2 kg), SpO2 99 %.    HEENT: No thrush or ulcers Lymphatics: No cervical, supraclavicular, axillary, or inguinal nodes Resp: Lungs clear bilaterally Cardio: Regular rate and rhythm GI: No mass, nontender, no hepatosplenomegaly Vascular: No leg edema Skin: Few acne type lesions over the anterior chest  Portacath/PICC-without erythema  Lab Results:  Lab Results  Component Value Date   WBC 11.2 (H) 06/28/2020   HGB 13.1 06/28/2020   HCT 40.5 06/28/2020   MCV 90.2 06/28/2020   PLT 342 06/28/2020   NEUTROABS 7.4 06/28/2020    CMP  Lab Results  Component Value Date   NA 136 06/28/2020   K 4.1 06/28/2020   CL 101 06/28/2020   CO2 26 06/28/2020   GLUCOSE 102 (H) 06/28/2020   BUN 15 06/28/2020   CREATININE 0.77 06/28/2020   CALCIUM 8.8 (L) 06/28/2020   PROT 6.2 (L) 06/28/2020   ALBUMIN 3.8 06/28/2020   AST 34 06/28/2020   ALT 52 (H) 06/28/2020   ALKPHOS 74 06/28/2020   BILITOT 0.3 06/28/2020   GFRNONAA >60 06/28/2020   GFRAA >60 11/05/2019    Lab Results  Component Value Date   CEA1 1.77 01/04/2016    Medications: I have reviewed the patient's current medications.   Assessment/Plan:  1. Metastatic renal cell carcinoma   L4 mass with extraosseous extension, L4 nerve compression  Biopsy of the L4 mass 11/18/2015 confirmed metastatic renal cell carcinoma, clear cell type  CTs of the chest, abdomen, and pelvis 11/18/2015-right lower lobe nodule, expansile  lytic lesion at the right 11th rib/costal vertebral junction, left renal mass, expansile lesion involving the L4 vertebra, lytic lesion at the left acetabulum, and a low-attenuation liver lesion  Initiation of SRS to L4 12/02/2015, Completed 12/12/2015  Initiation of Pazopanib11/04/2015  Pazopanibplaced on hold 02/06/2016 secondary to elevated liver enzymes  Pazopanibresumed 03/07/2016 at a dose of 400 mg daily  Pazopanibdiscontinued 03/19/2016 secondary to elevated liver enzymes  Restaging CTs 04/02/2016-stable left renal mass, decreased soft tissue component associated with the L4 metastasis, increased soft tissue component associated with the right 11th rib metastasis with increased T11 bony destruction, increased sclerosis at the left acetabulum lesion  Cycle 1 nivolumab 04/12/2016  Cycle 2 nivolumab 04/26/2016  Cycle 3 nivolumab03/16/2018  Cycle 4 nivolumab 05/24/2016  Cycle 5 nivolumab 06/08/2016  MRI lumbar spine 06/21/2016-unchanged tumor at L3, increased size of retroperitoneal lymph nodes compared to a CT from 04/02/2016  Cycle 6 nivolumab 06/22/2016  CTs chest, abdomen, and pelvis 07/04/2016-enlargement of the left renal mass, right adrenal nodule, left hilar and peritoneal lymph nodes, enlargement of left acetabular lesion. Stable lung nodules.  Cycle 7 nivolumab05/12/2016   Cycle 8 nivolumab 07/20/2016  Cycle 9 nivolumab 08/02/2016  Cycle 10 nivolumab 08/20/2016  Restaging CT 09/03/2016 evaluation with stable disease  Cycle 11 nivolumab 09/05/2016  Cycle 12 nivolumab 09/19/2016  Cycle 13 nivolumab 10/03/2016  Cycle 14 nivolumab 10/17/2016  Cycle 15 nivolumab 10/31/2016  Cycle 16 nivolumab 11/14/2016 (changed to monthly  schedule)  Cycle 17nivolumab10/24/2018  CTs 01/21/2017-increased left renal mass, increased size of adrenal metastases, increased lytic bone lesions, increased left lung nodule, persistent tumor at L4 with probable epidural  component  Initiation of Cabozantinib12/04/2016  Restaging CTs 05/30/2017-decreased size of left hilar mass, left renal mass, retroperitoneal adenopathy, and adrenal metastasis. Healing bone lesions.  Cabozantinib continued  CTs 10/21/2017-interval enlargement left hilar lymph node; stable rib lesions; stable mass left renal cortex; stable mildly nodular adrenal glands; stable lytic lesions within the pelvis and spine.  Cabozantinib continued  CTs 02/21/2018-enlargement of an AP window lymph node. Mildly enlarged left hilar lymph node is unchanged. Primary renal cell carcinoma involving the upper pole of the left kidney appears similar. Stable enlarged right periaortic lymph node adjacent to the renal vessels. Stable multifocal bony metastatic disease.  Cabozantinibcontinued  CTs 07/29/2018-stable 15 mm AP window nodes; stable left hilar node; subcarinal node slightly larger; right periaortic node 16 mm, previously 12 mm; portacaval node 24 mm, previously 16 mm; 15 mm node superior to the pancreatic head has enlarged; interval increase nodularity of both adrenal glands; stable left kidney mass; multifocal bony metastatic disease not significantly changed.  Cabozantinib continued  CTs 11/06/2018-moderate improvement in thoracic adenopathy; minimal improvement abdominal adenopathy; left kidney upper pole mass and various lytic expansile bone lesions stable; mild increase in nodularity of left adrenal gland; right adrenal gland nodularity stable.  Cabozantinib continued  Clinical evidence of partial seizure activity December 2020  CT head 02/25/2019-extensive vasogenic edema, left greater than right. Peripheral enhancing masses consistent with metastases. No hemorrhage.  MRI brain 03/11/2019-7 enhancing brain masses consistent with metastatic disease  SRS to 7 brain lesions, treatment given 03/19/2019, 03/23/2019, and 03/25/2019  CTs 04/09/2019-decrease in left renal mass, bilateral  adrenal nodules, AP window and porta hepatic adenopathy. New 9 mm right lower lobe nodule. Improved lytic lesion of the left acetabulum. Other bone lesions are stable.  Cabozantinib continued  MRI brain 07/17/2019-resolution of 4 mm treated lesion in the right frontal cortex, 6 remaining treated lesions have decreased in size, new punctate metastasis in the superior right cerebellum  SRS to right cerebellar lesion 07/30/2019  CTs 08/17/2019-previously noted right lower lobe nodule resolved, stable left kidney mass, mixed lytic/sclerotic bone lesions in the thoracolumbar spine, right posterior ribs, and left acetabulum-unchanged, no evidence of progressive disease  Cabozantinib continued  MRI brain 10/23/2019-stable to slight decrease in size of multiple enhancing intracranial lesions. Slight increase in surrounding edema in the medial left frontal lobe. No new lesions present.  MRI brain 01/29/2020-multiple enhancing brain lesions, some hemorrhagic, some with mild enlargement-treatment effect?  CTs 02/08/2020-no thoracic metastases, enlargement of the left renal mass, enlargement of lytic lesion at T9, other lytic lesions unchanged  MRI brain 05/04/2020-no new lesions, slight enlargement of a right occipital lesion, other lesions are stable or decreased in size 2. Pain secondary to #1-managed by Dr. Lovenia Shuck.Improved 3. Hypertension 3. Elevated transaminases 02/06/2016-Pazopanibplaced on hold  Liver enzymes normal 03/07/2016 5. Port-A-Cath placement 05/16/2016 6. Malaise/anorexia 09/05/2016. Cortisol and testosterone levels low. Hydrocortisone and testosterone replacement initiated. 7. Conjunctival/scleral erythema 09/19/2016-resolved with steroid eyedrops 8. 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 9. Hypercalcemia status post Zometa 01/23/2017-resolved 10. Pruritic rash following IV contrast 10/21/2017;rash following IV  contrast 02/21/2018 despite prednisone/Benadryl premedication 11. Brief episodes of expressive aphasia October and December 2020    Disposition: Mr. Salvucci appears stable.  There is no clinical evidence of disease progression.  He will continue  cabozantinib.  He will be scheduled for restaging CTs prior to an office visit in 6 weeks.  He continues follow-up with Dr. Mickeal Skinner for management of the brain metastases.  Betsy Coder, MD  06/28/2020  3:26 PM

## 2020-06-29 ENCOUNTER — Other Ambulatory Visit (HOSPITAL_BASED_OUTPATIENT_CLINIC_OR_DEPARTMENT_OTHER): Payer: Self-pay

## 2020-06-29 ENCOUNTER — Other Ambulatory Visit (HOSPITAL_COMMUNITY): Payer: Self-pay

## 2020-06-29 NOTE — Progress Notes (Signed)
   Covid-19 Vaccination Clinic  Name:  Arnez Stoneking    MRN: [id number] DOB: 01-02-1946  06/29/2020  Mr. Cherry was observed post Covid-19 immunization for 15 minutes without incident. He was provided with Vaccine Information Sheet and instruction to access the V-Safe system.   Mr. Sellen was instructed to call 911 with any severe reactions post vaccine: [name] Difficulty breathing  . Swelling of face and throat  . A fast heartbeat  . A bad rash all over body  . Dizziness and weakness   Immunizations Administered    Name Date Dose VIS Date Route   PFIZER Comrnaty(Gray TOP) Covid-19 Vaccine 06/28/2020  3:41 PM 0.3 mL 02/04/2020 Intramuscular   Manufacturer: [place]   Lot: [id number]   NDC: [phone]

## 2020-07-08 ENCOUNTER — Other Ambulatory Visit: Payer: Self-pay | Admitting: Nurse Practitioner

## 2020-07-08 ENCOUNTER — Other Ambulatory Visit (HOSPITAL_COMMUNITY): Payer: Self-pay

## 2020-07-08 DIAGNOSIS — C649 Malignant neoplasm of unspecified kidney, except renal pelvis: Secondary | ICD-10-CM

## 2020-07-08 MED FILL — Mirtazapine Tab 30 MG: ORAL | 90 days supply | Qty: 90 | Fill #0 | Status: CN

## 2020-07-08 MED FILL — Hydrocortisone Tab 10 MG: ORAL | 30 days supply | Qty: 90 | Fill #1 | Status: AC

## 2020-07-08 MED FILL — Ondansetron HCl Tab 8 MG: ORAL | 30 days supply | Qty: 90 | Fill #0 | Status: AC

## 2020-07-09 ENCOUNTER — Other Ambulatory Visit (HOSPITAL_COMMUNITY): Payer: Self-pay

## 2020-07-11 ENCOUNTER — Other Ambulatory Visit: Payer: Self-pay | Admitting: Nurse Practitioner

## 2020-07-11 ENCOUNTER — Other Ambulatory Visit (HOSPITAL_COMMUNITY): Payer: Self-pay

## 2020-07-11 DIAGNOSIS — C649 Malignant neoplasm of unspecified kidney, except renal pelvis: Secondary | ICD-10-CM

## 2020-07-11 MED ORDER — OXYCODONE HCL ER 10 MG PO T12A
EXTENDED_RELEASE_TABLET | Freq: Two times a day (BID) | ORAL | 0 refills | Status: DC
  Filled 2020-07-11: qty 120, 30d supply, fill #0

## 2020-07-11 NOTE — Telephone Encounter (Signed)
Refill

## 2020-07-12 ENCOUNTER — Other Ambulatory Visit (HOSPITAL_COMMUNITY): Payer: Self-pay

## 2020-07-16 ENCOUNTER — Other Ambulatory Visit (HOSPITAL_COMMUNITY): Payer: Self-pay

## 2020-07-20 ENCOUNTER — Other Ambulatory Visit (HOSPITAL_COMMUNITY): Payer: Self-pay

## 2020-07-20 MED FILL — Mirtazapine Tab 30 MG: ORAL | 90 days supply | Qty: 90 | Fill #0 | Status: AC

## 2020-07-27 ENCOUNTER — Encounter: Payer: Self-pay | Admitting: *Deleted

## 2020-07-27 ENCOUNTER — Other Ambulatory Visit (HOSPITAL_COMMUNITY): Payer: Self-pay

## 2020-07-27 ENCOUNTER — Telehealth: Payer: Self-pay | Admitting: *Deleted

## 2020-07-27 MED ORDER — PREDNISONE 50 MG PO TABS
ORAL_TABLET | ORAL | 0 refills | Status: DC
  Filled 2020-07-27: qty 3, 1d supply, fill #0

## 2020-07-27 NOTE — Telephone Encounter (Signed)
Left VM with CT scan appointment and prep and also left same information on Mychart message. Prednisone premeds sent to [facility]

## 2020-07-28 ENCOUNTER — Telehealth: Payer: Self-pay | Admitting: Internal Medicine

## 2020-07-28 NOTE — Telephone Encounter (Signed)
Scheduled appointment per 06/02 schedule message. Patient is aware.

## 2020-08-09 ENCOUNTER — Ambulatory Visit: Payer: PPO | Admitting: Internal Medicine

## 2020-08-09 ENCOUNTER — Other Ambulatory Visit: Payer: Self-pay | Admitting: Nurse Practitioner

## 2020-08-09 ENCOUNTER — Other Ambulatory Visit (HOSPITAL_COMMUNITY): Payer: Self-pay

## 2020-08-09 ENCOUNTER — Other Ambulatory Visit: Payer: Self-pay | Admitting: Oncology

## 2020-08-09 DIAGNOSIS — C649 Malignant neoplasm of unspecified kidney, except renal pelvis: Secondary | ICD-10-CM

## 2020-08-09 MED ORDER — OXYCODONE HCL ER 10 MG PO T12A
EXTENDED_RELEASE_TABLET | Freq: Two times a day (BID) | ORAL | 0 refills | Status: DC
  Filled 2020-08-09: qty 120, 30d supply, fill #0

## 2020-08-09 MED ORDER — CABOMETYX 40 MG PO TABS
ORAL_TABLET | ORAL | 1 refills | Status: DC
  Filled 2020-08-09: qty 30, 30d supply, fill #0
  Filled 2020-09-06: qty 30, 30d supply, fill #1

## 2020-08-09 MED FILL — Testosterone TD Gel 20.25 MG/ACT (1.62%): TRANSDERMAL | 30 days supply | Qty: 75 | Fill #0 | Status: AC

## 2020-08-09 MED FILL — Hydrocortisone Tab 10 MG: ORAL | 30 days supply | Qty: 90 | Fill #2 | Status: AC

## 2020-08-09 NOTE — Telephone Encounter (Signed)
Refill request

## 2020-08-10 ENCOUNTER — Other Ambulatory Visit: Payer: Self-pay | Admitting: Radiation Therapy

## 2020-08-10 ENCOUNTER — Ambulatory Visit
Admission: RE | Admit: 2020-08-10 | Discharge: 2020-08-10 | Disposition: A | Discharge status: Home / Self Care | Payer: PPO | Source: Ambulatory Visit | Attending: Internal Medicine | Admitting: Internal Medicine

## 2020-08-10 ENCOUNTER — Other Ambulatory Visit (HOSPITAL_COMMUNITY): Payer: Self-pay

## 2020-08-10 DIAGNOSIS — C7931 Secondary malignant neoplasm of brain: Secondary | ICD-10-CM

## 2020-08-10 DIAGNOSIS — J3489 Other specified disorders of nose and nasal sinuses: Secondary | ICD-10-CM | POA: Diagnosis not present

## 2020-08-10 DIAGNOSIS — I639 Cerebral infarction, unspecified: Secondary | ICD-10-CM | POA: Diagnosis not present

## 2020-08-10 DIAGNOSIS — G9389 Other specified disorders of brain: Secondary | ICD-10-CM | POA: Diagnosis not present

## 2020-08-10 DIAGNOSIS — C719 Malignant neoplasm of brain, unspecified: Secondary | ICD-10-CM | POA: Diagnosis not present

## 2020-08-10 MED ORDER — SODIUM CHLORIDE 0.9% FLUSH
10.0000 mL | INTRAVENOUS | Status: DC | PRN
  Administered 2020-08-10: 10 mL via INTRAVENOUS

## 2020-08-10 MED ORDER — HEPARIN SOD (PORK) LOCK FLUSH 100 UNIT/ML IV SOLN
500.0000 [IU] | Freq: Once | INTRAVENOUS | Status: AC
  Administered 2020-08-10: 500 [IU] via INTRAVENOUS

## 2020-08-10 MED ORDER — GADOBENATE DIMEGLUMINE 529 MG/ML IV SOLN
20.0000 mL | Freq: Once | INTRAVENOUS | Status: AC | PRN
  Administered 2020-08-10: 20 mL via INTRAVENOUS

## 2020-08-10 NOTE — Progress Notes (Signed)
Orders placed to access port for Brain MRI at [facility]

## 2020-08-11 ENCOUNTER — Other Ambulatory Visit: Payer: Self-pay

## 2020-08-11 ENCOUNTER — Inpatient Hospital Stay: Payer: PPO | Admitting: Nurse Practitioner

## 2020-08-11 ENCOUNTER — Inpatient Hospital Stay: Payer: PPO

## 2020-08-11 ENCOUNTER — Other Ambulatory Visit: Payer: PPO

## 2020-08-11 ENCOUNTER — Encounter (HOSPITAL_BASED_OUTPATIENT_CLINIC_OR_DEPARTMENT_OTHER): Payer: Self-pay

## 2020-08-11 ENCOUNTER — Inpatient Hospital Stay: Payer: PPO | Attending: Oncology

## 2020-08-11 ENCOUNTER — Ambulatory Visit (HOSPITAL_BASED_OUTPATIENT_CLINIC_OR_DEPARTMENT_OTHER)
Admission: RE | Admit: 2020-08-11 | Discharge: 2020-08-11 | Disposition: A | Discharge status: Home / Self Care | Payer: PPO | Source: Ambulatory Visit | Attending: Oncology | Admitting: Oncology

## 2020-08-11 ENCOUNTER — Other Ambulatory Visit (HOSPITAL_COMMUNITY): Payer: Self-pay

## 2020-08-11 DIAGNOSIS — C7900 Secondary malignant neoplasm of unspecified kidney and renal pelvis: Secondary | ICD-10-CM | POA: Diagnosis not present

## 2020-08-11 DIAGNOSIS — I700 Atherosclerosis of aorta: Secondary | ICD-10-CM | POA: Insufficient documentation

## 2020-08-11 DIAGNOSIS — E279 Disorder of adrenal gland, unspecified: Secondary | ICD-10-CM | POA: Diagnosis not present

## 2020-08-11 DIAGNOSIS — D134 Benign neoplasm of liver: Secondary | ICD-10-CM | POA: Diagnosis not present

## 2020-08-11 DIAGNOSIS — C642 Malignant neoplasm of left kidney, except renal pelvis: Secondary | ICD-10-CM | POA: Insufficient documentation

## 2020-08-11 DIAGNOSIS — C649 Malignant neoplasm of unspecified kidney, except renal pelvis: Secondary | ICD-10-CM

## 2020-08-11 DIAGNOSIS — C7931 Secondary malignant neoplasm of brain: Secondary | ICD-10-CM | POA: Diagnosis not present

## 2020-08-11 DIAGNOSIS — Z79899 Other long term (current) drug therapy: Secondary | ICD-10-CM | POA: Diagnosis not present

## 2020-08-11 DIAGNOSIS — N189 Chronic kidney disease, unspecified: Secondary | ICD-10-CM | POA: Diagnosis not present

## 2020-08-11 DIAGNOSIS — Z7952 Long term (current) use of systemic steroids: Secondary | ICD-10-CM | POA: Insufficient documentation

## 2020-08-11 DIAGNOSIS — N400 Enlarged prostate without lower urinary tract symptoms: Secondary | ICD-10-CM | POA: Insufficient documentation

## 2020-08-11 DIAGNOSIS — K8020 Calculus of gallbladder without cholecystitis without obstruction: Secondary | ICD-10-CM | POA: Diagnosis not present

## 2020-08-11 DIAGNOSIS — I10 Essential (primary) hypertension: Secondary | ICD-10-CM | POA: Insufficient documentation

## 2020-08-11 DIAGNOSIS — Z95828 Presence of other vascular implants and grafts: Secondary | ICD-10-CM

## 2020-08-11 DIAGNOSIS — C7951 Secondary malignant neoplasm of bone: Secondary | ICD-10-CM | POA: Insufficient documentation

## 2020-08-11 LAB — CBC WITH DIFFERENTIAL (CANCER CENTER ONLY)
Abs Immature Granulocytes: 0.02 10*3/uL (ref 0.00–0.07)
Basophils Absolute: 0 10*3/uL (ref 0.0–0.1)
Basophils Relative: 0 %
Eosinophils Absolute: 0 10*3/uL (ref 0.0–0.5)
Eosinophils Relative: 0 %
HCT: 37.8 % — ABNORMAL LOW (ref 39.0–52.0)
Hemoglobin: 12.3 g/dL — ABNORMAL LOW (ref 13.0–17.0)
Immature Granulocytes: 0 %
Lymphocytes Relative: 18 %
Lymphs Abs: 1.2 10*3/uL (ref 0.7–4.0)
MCH: 29.5 pg (ref 26.0–34.0)
MCHC: 32.5 g/dL (ref 30.0–36.0)
MCV: 90.6 fL (ref 80.0–100.0)
Monocytes Absolute: 0.1 10*3/uL (ref 0.1–1.0)
Monocytes Relative: 1 %
Neutro Abs: 5.1 10*3/uL (ref 1.7–7.7)
Neutrophils Relative %: 81 %
Platelet Count: 271 10*3/uL (ref 150–400)
RBC: 4.17 MIL/uL — ABNORMAL LOW (ref 4.22–5.81)
RDW: 15.4 % (ref 11.5–15.5)
WBC Count: 6.3 10*3/uL (ref 4.0–10.5)
nRBC: 0 % (ref 0.0–0.2)

## 2020-08-11 LAB — CMP (CANCER CENTER ONLY)
ALT: 19 U/L (ref 0–44)
AST: 13 U/L — ABNORMAL LOW (ref 15–41)
Albumin: 3.9 g/dL (ref 3.5–5.0)
Alkaline Phosphatase: 59 U/L (ref 38–126)
Anion gap: 8 (ref 5–15)
BUN: 14 mg/dL (ref 8–23)
CO2: 27 mmol/L (ref 22–32)
Calcium: 8.8 mg/dL — ABNORMAL LOW (ref 8.9–10.3)
Chloride: 98 mmol/L (ref 98–111)
Creatinine: 0.71 mg/dL (ref 0.61–1.24)
GFR, Estimated: >60 mL/min (ref >60)
Glucose, Bld: 151 mg/dL — ABNORMAL HIGH (ref 70–99)
Potassium: 4.4 mmol/L (ref 3.5–5.1)
Sodium: 133 mmol/L — ABNORMAL LOW (ref 135–145)
Total Bilirubin: 0.3 mg/dL (ref 0.3–1.2)
Total Protein: 6.2 g/dL — ABNORMAL LOW (ref 6.5–8.1)

## 2020-08-11 LAB — POCT I-STAT CREATININE: Creatinine, Ser: 0.7 mg/dL (ref 0.61–1.24)

## 2020-08-11 MED ORDER — HEPARIN SOD (PORK) LOCK FLUSH 100 UNIT/ML IV SOLN
500.0000 [IU] | Freq: Once | INTRAVENOUS | Status: AC
Start: 2020-08-11 — End: 2020-08-11
  Administered 2020-08-11: 500 [IU]
  Filled 2020-08-11: qty 5

## 2020-08-11 MED ORDER — SODIUM CHLORIDE 0.9% FLUSH
10.0000 mL | Freq: Once | INTRAVENOUS | Status: AC
  Administered 2020-08-11: 10 mL
  Filled 2020-08-11: qty 10

## 2020-08-11 MED ORDER — IOHEXOL 300 MG/ML  SOLN
100.0000 mL | Freq: Once | INTRAMUSCULAR | Status: AC | PRN
  Administered 2020-08-11: 100 mL via INTRAVENOUS

## 2020-08-11 MED ORDER — ZOLEDRONIC ACID 4 MG/100ML IV SOLN: 4.0000 mg | Freq: Once | INTRAVENOUS | Status: DC

## 2020-08-11 MED ORDER — DIPHENHYDRAMINE HCL 50 MG/ML IJ SOLN: 50.0000 mg | Freq: Once | INTRAMUSCULAR | Status: DC

## 2020-08-11 MED ORDER — PREDNISONE 50 MG PO TABS: 50.0000 mg | ORAL_TABLET | Freq: Four times a day (QID) | ORAL | Status: DC

## 2020-08-11 MED ORDER — DIPHENHYDRAMINE HCL 25 MG PO CAPS: 50.0000 mg | ORAL_CAPSULE | Freq: Once | ORAL | Status: DC

## 2020-08-11 NOTE — Addendum Note (Signed)
Addended by: Tedd Sias on: 08/11/2020 09:27 AM   Modules accepted: Orders

## 2020-08-11 NOTE — Addendum Note (Signed)
Addended by: Roselind Messier A on: 08/11/2020 09:24 AM   Modules accepted: Orders

## 2020-08-12 ENCOUNTER — Inpatient Hospital Stay: Payer: PPO | Admitting: Oncology

## 2020-08-12 ENCOUNTER — Other Ambulatory Visit (HOSPITAL_COMMUNITY): Payer: Self-pay

## 2020-08-12 VITALS — BP 134/90 | HR 63 | Temp 98.1°F | Resp 18 | Ht 70.0 in | Wt 239.0 lb

## 2020-08-12 DIAGNOSIS — C649 Malignant neoplasm of unspecified kidney, except renal pelvis: Secondary | ICD-10-CM | POA: Diagnosis not present

## 2020-08-12 DIAGNOSIS — C642 Malignant neoplasm of left kidney, except renal pelvis: Secondary | ICD-10-CM | POA: Diagnosis not present

## 2020-08-12 MED ORDER — OXYCODONE HCL 5 MG PO TABS
5.0000 mg | ORAL_TABLET | Freq: Four times a day (QID) | ORAL | 0 refills | Status: DC | PRN
  Filled 2020-08-12: qty 60, 15d supply, fill #0

## 2020-08-12 NOTE — Progress Notes (Signed)
[facility] OFFICE PROGRESS NOTE   Diagnosis: Renal cell carcinoma  INTERVAL HISTORY:   Phillip [name] returns as scheduled.  He continues cabozantinib.  He just returned from a vacation to the beach.  He reports increased pain in the right leg.  He continues OxyContin twice daily and as needed oxycodone.  He also takes ibuprofen as needed.  No seizures.  No other complaint.  Objective:  Vital signs in last 24 hours:  Blood pressure 134/90, pulse 63, temperature 98.1 F (36.7 C), temperature source Oral, resp. rate 18, height 5\' 10"  (1.778 m), weight 239 lb (108.4 kg), SpO2 96 %.    HEENT: No thrush or ulcers Resp: Lungs clear bilaterally Cardio: Regular rate and rhythm GI: No hepatomegaly, nontender Vascular: No leg edema   Portacath/PICC-without erythema  Lab Results:  Lab Results  Component Value Date   WBC 6.3 08/11/2020   HGB 12.3 (L) 08/11/2020   HCT 37.8 (L) 08/11/2020   MCV 90.6 08/11/2020   PLT 271 08/11/2020   NEUTROABS 5.1 08/11/2020    CMP  Lab Results  Component Value Date   NA 133 (L) 08/11/2020   K 4.4 08/11/2020   CL 98 08/11/2020   CO2 27 08/11/2020   GLUCOSE 151 (H) 08/11/2020   BUN 14 08/11/2020   CREATININE 0.70 08/11/2020   CALCIUM 8.8 (L) 08/11/2020   PROT 6.2 (L) 08/11/2020   ALBUMIN 3.9 08/11/2020   AST 13 (L) 08/11/2020   ALT 19 08/11/2020   ALKPHOS 59 08/11/2020   BILITOT 0.3 08/11/2020   GFRNONAA >60 08/11/2020   GFRAA >60 11/05/2019    Lab Results  Component Value Date   CEA1 1.77 01/04/2016    Lab Results  Component Value Date   INR 0.98 05/16/2016    Imaging:  MR BRAIN W WO CONTRAST  Result Date: 08/10/2020 CLINICAL DATA:  Brain/CNS neoplasm, assess treatment response. EXAM: MRI HEAD WITHOUT AND WITH CONTRAST TECHNIQUE: Multiplanar, multiecho pulse sequences of the brain and surrounding structures were obtained without and with intravenous contrast. CONTRAST:  37mL MULTIHANCE GADOBENATE DIMEGLUMINE 529  MG/ML IV SOLN COMPARISON:  May 04, 2020. FINDINGS: Brain: No acute infarct, acute hemorrhage, hydrocephalus, or extra-axial fluid collection. 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 Enhancing lesions consistent with metastatic disease are again identified and labeled on series 17. Increased size: 15 mm lesion in the anteromedial left frontal lobe (image 128), increased in size (previously 13 mm when remeasured) Stable or decreased size: 11 mm lesion in the high left frontal lobe, unchanged (image 145) Punctate lesion in the inferior left cerebellum, unchanged (image 17). Punctate lesion in the lateral right cerebellum (image 45). Ill-defined 6 mm lesion the right occipital lobe (image 69), similar versus slightly decreased in size (previously 8 mm). 6 mm lesion in the left parietal lobe (image 108), similar. 10 mm lesion in the left occipital lobe (image 51). Possible new lesions: New punctate focus of enhancement in the right occipital cortex (series 17, image 96; series 19, image 13) Vascular: Major arterial flow voids are maintained at the skull base. Skull and upper cervical spine: Normal marrow signal. Sinuses/Orbits: Mild scattered paranasal sinus mucosal thickening. Unremarkable orbits. Other: No sizable mastoid effusions. IMPRESSION: 1. Slight increase in a 15 mm lesion in the anteromedial left frontal lobe (previously 13 mm). 2. New punctate focus of enhancement in the right occipital cortex (series 17, image 96; series 19, image 13) is suspicious for a  new metastasis. 3. Other metastatic lesions of are stable or slightly decreased in size, as detailed above. Electronically Signed   By: Margaretha Sheffield MD   On: 08/10/2020 16:57   CT CHEST ABDOMEN PELVIS W CONTRAST  Result Date: 08/11/2020 CLINICAL DATA:  Metastatic renal cell carcinoma restaging EXAM: CT CHEST, ABDOMEN, AND PELVIS WITH CONTRAST TECHNIQUE: Multidetector CT  imaging of the chest, abdomen and pelvis was performed following the standard protocol during bolus administration of intravenous contrast. CONTRAST:  147mL OMNIPAQUE IOHEXOL 300 MG/ML SOLN, additional oral enteric contrast COMPARISON:  02/08/2020 FINDINGS: CT CHEST FINDINGS Cardiovascular: Right chest port catheter. Aortic atherosclerosis. Normal heart size. Left coronary artery calcifications. No pericardial effusion. Mediastinum/Nodes: No enlarged mediastinal, hilar, or axillary lymph nodes. Thyroid gland, trachea, and esophagus demonstrate no significant findings. Lungs/Pleura: Lungs are clear. No pleural effusion or pneumothorax. Musculoskeletal: No chest wall mass. CT ABDOMEN PELVIS FINDINGS Hepatobiliary: There is a new hypodense lesion of the posterior liver dome, hepatic segment VII, measuring 1.6 x 1.1 cm (series 2, image 55). There is an unchanged, benign cyst of the central right lobe of the liver (series 2, image 53). Multiple small gallstones. No gallbladder wall thickening, or biliary dilatation. Pancreas: Unremarkable. No pancreatic ductal dilatation or surrounding inflammatory changes. Spleen: Normal in size without significant abnormality. Adrenals/Urinary Tract: Multiple bilateral adrenal nodules, not significantly changed. Interval enlargement of a hypodense, partially exophytic mass of the posterior superior pole of the left kidney, measuring 3.7 x 3.7 cm, previously 3.4 x 3.2 cm when measured similarly (series 2, image 66). Mild thickening of the urinary bladder wall. There is a 0.7 cm endoluminal nodule of the left aspect of the bladder dome, which in retrospect is enlarged compared to prior examination, at which time it measured no greater than 2-3 mm (series 5, image 54). Stomach/Bowel: Stomach is within normal limits. Appendix appears normal. No evidence of bowel wall thickening, distention, or inflammatory changes. Vascular/Lymphatic: Aortic atherosclerosis. No enlarged abdominal or pelvic  lymph nodes. Reproductive: Prostatomegaly. Other: No abdominal wall hernia or abnormality. No abdominopelvic ascites. Musculoskeletal: Unchanged sclerotic lesion of the T1 vertebral body (series 6, image 82). Increasing lysis of a sclerotic lesion of the right second rib head (series 2, image 10). There has been significant interval enlargement of a lytic lesion of the T9 vertebral body, measuring approximately 3.4 x 2.7 cm, previously 2.0 x 2.0 cm (series 2, image 44). Additional unchanged lytic lesions throughout the spine and pelvis, including of the right aspect of T11 and the adjacent right eleventh rib head (series 2, image 54), the right aspect of L4 (series 2, image 88), and the left anterior superior acetabulum (series 2, image 115). Unchanged wedge deformity of the L1 vertebral body without evident associated lytic or sclerotic lesion. IMPRESSION: 1. Interval enlargement of a hypodense, partially exophytic mass of the posterior superior pole of the left kidney, measuring 3.7 x 3.7 cm, previously 3.4 x 3.2 cm when measured similarly. Findings are consistent with worsened renal cell carcinoma. 2. New hypodense lesion of the posterior liver dome, hepatic segment VII, measuring 1.6 x 1.1 cm, consistent with a new hepatic metastasis. 3. Interval enlargement of a lytic lesion of the T9 vertebral body, and increasing lysis of a sclerotic lesion of the right second rib head. Findings are consistent with worsened osseous metastatic disease. Multiple additional osseous lesions detailed above are unchanged. 4. There is a 0.7 cm endoluminal nodule of the left aspect of the bladder dome, which in retrospect is enlarged compared to  prior examination, at which time it measured no greater than 2-3 mm. This is of uncertain significance, although suspicious for a small bladder malignancy, renal cell metastasis to the bladder mucosa less favored although a possible differential consideration. These results will be called to  the ordering clinician or representative by the Radiologist Assistant, and communication documented in the PACS or [employer]. Aortic Atherosclerosis (ICD10-I70.0). Electronically Signed   By: Eddie Candle M.D.   On: 08/11/2020 13:37    Medications: I have reviewed the patient's current medications.   Assessment/Plan: Metastatic renal cell carcinoma  L4 mass with extraosseous extension, L4 nerve compression Biopsy of the L4 mass 11/18/2015 confirmed metastatic renal cell carcinoma, clear cell type CTs of the chest, abdomen, and pelvis 11/18/2015-right lower lobe nodule, expansile lytic lesion at the right 11th rib/costal vertebral junction, left renal mass, expansile lesion involving the L4 vertebra, lytic lesion at the left acetabulum, and a low-attenuation liver lesion Initiation of SRS to L4 12/02/2015, Completed 12/12/2015 Initiation of Pazopanib 12/30/2015 Pazopanib placed on hold 02/06/2016 secondary to elevated liver enzymes Pazopanib resumed 03/07/2016 at a dose of 400 mg daily  Pazopanib discontinued 03/19/2016 secondary to elevated liver enzymes Restaging CTs 04/02/2016-stable left renal mass, decreased soft tissue component associated with the L4 metastasis, increased soft tissue component associated with the right 11th rib metastasis with increased T11 bony destruction, increased sclerosis at the left acetabulum lesion Cycle 1 nivolumab 04/12/2016 Cycle 2 nivolumab 04/26/2016 Cycle 3 nivolumab 05/11/2016 Cycle 4 nivolumab 05/24/2016 Cycle 5 nivolumab 06/08/2016 MRI lumbar spine 06/21/2016-unchanged tumor at L3, increased size of retroperitoneal lymph nodes compared to a CT from 04/02/2016 Cycle 6 nivolumab  06/22/2016 CTs chest, abdomen, and pelvis 07/04/2016-enlargement of the left renal mass, right adrenal nodule, left hilar and peritoneal lymph nodes, enlargement of left acetabular lesion. Stable lung nodules. Cycle 7 nivolumab 07/06/2016  Cycle 8 nivolumab  07/20/2016 Cycle 9 nivolumab 08/02/2016 Cycle 10 nivolumab 08/20/2016 Restaging CT 09/03/2016 evaluation with stable disease Cycle 11 nivolumab 09/05/2016 Cycle 12 nivolumab 09/19/2016 Cycle 13 nivolumab 10/03/2016 Cycle 14 nivolumab 10/17/2016 Cycle 15 nivolumab 10/31/2016 Cycle 16 nivolumab 11/14/2016 (changed to monthly schedule) Cycle 17 nivolumab 12/19/2016 CTs 01/21/2017-increased left renal mass, increased size of adrenal metastases, increased lytic bone lesions, increased left lung nodule, persistent tumor at L4 with probable epidural component Initiation of Cabozantinib 01/28/2017 Restaging CTs 05/30/2017- decreased size of left hilar mass, left renal mass, retroperitoneal adenopathy, and adrenal metastasis.  Healing bone lesions. Cabozantinib continued CTs 10/21/2017- interval enlargement left hilar lymph node; stable rib lesions; stable mass left renal cortex; stable mildly nodular adrenal glands; stable lytic lesions within the pelvis and spine. Cabozantinib continued CTs 02/21/2018- enlargement of an AP window lymph node.  Mildly enlarged left hilar lymph node is unchanged.  Primary renal cell carcinoma involving the upper pole of the left kidney appears similar.  Stable enlarged right periaortic lymph node adjacent to the renal vessels.  Stable multifocal bony metastatic disease. Cabozantinib continued CTs 07/29/2018- stable 15 mm AP window nodes; stable left hilar node; subcarinal node slightly larger; right periaortic node 16 mm, previously 12 mm; portacaval node 24 mm, previously 16 mm; 15 mm node superior to the pancreatic head has enlarged; interval increase nodularity of both adrenal glands; stable left kidney mass; multifocal bony metastatic disease not significantly changed. Cabozantinib continued CTs 11/06/2018- moderate improvement in thoracic adenopathy; minimal improvement abdominal adenopathy; left kidney upper pole mass and various lytic expansile bone lesions stable; mild  increase in nodularity of left adrenal gland;  right adrenal gland nodularity stable. 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  Peripheral enhancing masses consistent with metastases. No hemorrhage. MRI brain 03/11/2019-7 enhancing brain masses consistent with metastatic disease SRS to 7 brain lesions, treatment given 03/19/2019, 03/23/2019, and 03/25/2019 CTs 04/09/2019-decrease in left renal mass, bilateral adrenal nodules, AP window and porta hepatic adenopathy.  New 9 mm right lower lobe nodule.  Improved lytic lesion of the left acetabulum.  Other bone lesions are stable. Cabozantinib continued MRI brain 07/17/2019-resolution of 4 mm treated lesion in the right frontal cortex, 6 remaining treated lesions have decreased in size, new punctate metastasis in the superior right cerebellum SRS to right cerebellar lesion 07/30/2019 CTs 08/17/2019-previously noted right lower lobe nodule resolved, stable left kidney mass, mixed lytic/sclerotic bone lesions in the thoracolumbar spine, right posterior ribs, and left acetabulum-unchanged, no evidence of progressive disease  Cabozantinib continued MRI brain 10/23/2019-stable to slight decrease in size of multiple enhancing intracranial lesions.  Slight increase in surrounding edema in the medial left frontal lobe.  No new lesions present. MRI brain 01/29/2020-multiple enhancing brain lesions, some hemorrhagic, some with mild enlargement-treatment effect? CTs 02/08/2020-no thoracic metastases, enlargement of the left renal mass, enlargement of lytic lesion at T9, other lytic lesions unchanged MRI brain 05/04/2020-no new lesions, slight enlargement of a right occipital lesion, other lesions are stable or decreased in size CTs 08/11/2020-enlargement of left kidney mass, new 1.6 cm segment 7 liver lesion, enlargement of a lytic lesion at T9, increased lysis of a  sclerotic lesion at the right second rib, 0.7 cm endoluminal nodule at the bladder dome-enlarged Brain MRI 08/10/2020-slight increase in a 15 mm left frontal lobe lesion, new punctate focus in the right occipital cortex, other lesions stable or slightly decreased Pain secondary to #1-managed by Dr. Lovenia Shuck.  Improved Hypertension Elevated transaminases 02/06/2016- Pazopanib placed on hold Liver enzymes normal 03/07/2016 Port-A-Cath placement 05/16/2016 Malaise/anorexia 09/05/2016. Cortisol and testosterone levels low. Hydrocortisone and testosterone replacement initiated. Conjunctival/scleral erythema 09/19/2016-resolved with steroid eyedrops Proximal right leg weakness. 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 Hypercalcemia status post Zometa 01/23/2017-resolved Pruritic rash following IV contrast 10/21/2017; rash following IV contrast 02/21/2018 despite prednisone/Benadryl premedication Brief episodes of expressive aphasia October and December 2020       Disposition: Phillip [name] appears unchanged.  I reviewed the CT and MRI findings with him.  We reviewed the CT images.  There appears to be minimal enlargement of the left renal mass and a T9 lesion.  There is a small new liver lesion.  He has been maintained on cabozantinib since December 2018.  In the absence of clinical or more significant disease progression we decided to continue cabozantinib.  He will undergo restaging CTs in 3 months.  He is scheduled to see Dr. Mickeal Skinner on 08/15/2020 to review the brain MRI.  I refilled his prescription for oxycodone.  He will continue OxyContin.  Betsy Coder, MD  08/12/2020  4:01 PM

## 2020-08-15 ENCOUNTER — Other Ambulatory Visit: Payer: Self-pay

## 2020-08-15 ENCOUNTER — Encounter: Payer: Self-pay | Admitting: Internal Medicine

## 2020-08-15 ENCOUNTER — Inpatient Hospital Stay: Payer: PPO | Admitting: Internal Medicine

## 2020-08-15 VITALS — BP 132/85 | HR 58 | Temp 97.5°F | Resp 20 | Wt 239.5 lb

## 2020-08-15 DIAGNOSIS — R569 Unspecified convulsions: Secondary | ICD-10-CM

## 2020-08-15 DIAGNOSIS — C7931 Secondary malignant neoplasm of brain: Secondary | ICD-10-CM | POA: Diagnosis not present

## 2020-08-15 DIAGNOSIS — C642 Malignant neoplasm of left kidney, except renal pelvis: Secondary | ICD-10-CM | POA: Diagnosis not present

## 2020-08-15 NOTE — Progress Notes (Signed)
[place] [facility] [place], [place] [address] [phone]   Interval Evaluation  Date of Service: 08/15/20 Patient Name: Phillip [name] Patient MRN: [id number] Patient DOB: Dec 23, 1945 Provider: [name], MD  Identifying Statement:  Phillip [name] is a 75 y.o. male with Brain metastases [facility]) [C79.31]   Primary Cancer: Renal Cell Carcinoma  CNS Oncologic History: 03/19/19-03/25/19 SRS Treatment: PTV1 Lt Temporal 87m 27 Gy in 3 fractions PTV2 Lt Occipital 133m 27 Gy in 3 fractions PTV6 Lt Frontal 2038m27 Gy in 3 fractions   03/19/19 SRS Treatment:  PTV3 Rt Occipital 48m22mTV4 Lt Frontal 12mm45mTV5 Rt Frontal 4mm  29m7 Lt Parietal 9mm  A21mof these were treated to 20 Gy in 1 fraction   12/02/2015 to 12/12/2015 SBRT Treatment:  The L4 Right spinal was treated to 50 Gy in 5 fractions at 10 Gy per fraction  Interval History:  Phillip [name] today for follow up after recent brain MRI.  Denies any new or progressive neurologic deficits.  Denies headaches and seizures. Remains active at home and at church. Takes cabometyx for metastatic renal cell carcinoma through Dr. [name]: Current Outpatient Medications on File Prior to Visit  Medication Sig Dispense Refill   acetaminophen (TYLENOL) 650 MG CR tablet Take 650 mg by mouth every 8 (eight) hours as needed for pain.     amoxicillin-clavulanate (AUGMENTIN) 875-125 MG tablet Tale 1 tablet by mouth twice daily 14 tablet 0   cabozantinib (CABOMETYX) 40 MG tablet TAKE 1 TABLET (40 MG TOTAL) BY MOUTH DAILY. TAKE 1 HOUR BEFORE OR 2 HOURS AFTER MEALS. 30 tablet 1   COVID-19 mRNA Vac-TriS, Pfizer, (PFIZER-BIONT COVID-19 VAC-TRIS) SUSP injection Inject into the muscle. 0.3 mL 0   docusate sodium (COLACE) 100 MG capsule Take 100 mg by mouth 2 (two) times daily.     famotidine (PEPCID) 10 MG tablet Take 10 mg by mouth 2 (two) times daily.      hydrocortisone (CORTEF) 10 MG tablet TAKE 2 TABLETS BY MOUTH IN THE MORNING AND 1 TABLET IN THE EVENING 90 tablet 3   ibuprofen (ADVIL,MOTRIN) 200 MG tablet Take 200 mg by mouth every 8 (eight) hours as needed for fever, headache, mild pain, moderate pain or cramping.      levETIRAcetam (KEPPRA) 500 MG tablet TAKE 1 TABLET BY MOUTH 2 TIMES DAILY 60 tablet 2   lidocaine-prilocaine (EMLA) cream APPLY TO PORTACATH 1 HOUR PRIOR TO USE AS NEEDED 30 g 2   LORazepam (ATIVAN) 0.5 MG tablet Take 1-2 tablets (0.5-1 mg total) by mouth every 6 (six) hours as needed for anxiety. 60 tablet 2   losartan-hydrochlorothiazide (HYZAAR) 100-12.5 MG tablet Take 1 tablet by mouth daily.      losartan-hydrochlorothiazide (HYZAAR) 100-12.5 MG tablet TAKE 1 TABLET BY MOUTH ONCE A DAY 30 tablet 1   mirtazapine (REMERON) 30 MG tablet Take 1 tablet (30 mg total) by mouth at bedtime. 90 tablet 1   mirtazapine (REMERON) 30 MG tablet TAKE 1 TABLET BY MOUTH EVERY EVENING AT BEDTIME 90 tablet 5   Multiple Vitamin (MULTIVITAMIN ADULT PO) Take 1 tablet by mouth daily. [place] product     ondansetron (ZOFRAN) 8 MG tablet TAKE 1 TABLET BY MOUTH EVERY 8 HOURS AS NEEDED FOR NAUSEA AND VOMITING 90 tablet 2   oxyCODONE (OXY IR/ROXICODONE) 5 MG immediate release tablet Take 1 tablet (5 mg total)  by mouth every 6 (six) hours as needed for severe pain. 60 tablet 0   oxyCODONE (OXYCONTIN) 10 mg 12 hr tablet TAKE 2 TABLETS BY MOUTH EVERY 12 HOURS. 120 tablet 0   polyethylene glycol (MIRALAX / GLYCOLAX) 17 g packet Take 17 g by mouth daily.     predniSONE (DELTASONE) 50 MG tablet Take 1 tablet (50 mg) 13 hours, 7 hours and 1 hour prior to CT scan. Take Benadryl OTC 50 mg 1 hour prior to scan 3 tablet 0   Testosterone 1.62 % GEL APPLY 2 PUMPS TO AREA ONCE A DAY AS DIRECTED 75 g 3   diphenhydrAMINE (BENADRYL) 50 MG tablet Take 1 tablet (50 mg total) by mouth as directed. Take Benadryl 50 mg by mouth 1 hour before CT scan. (Patient not taking:  Reported on 08/15/2020) 1 tablet 0   Current Facility-Administered Medications on File Prior to Visit  Medication Dose Route Frequency Provider Last Rate Last Admin   sodium chloride flush (NS) 0.9 % injection 10 mL  10 mL Intravenous PRN [name], MD   10 mL at 06/22/16 5093    Allergies:  Allergies  Allergen Reactions   Contrast Media [Iodinated Diagnostic Agents] Rash   Prednisone & Diphenhydramine Rash   Past Medical History:  Past Medical History:  Diagnosis Date   Anxiety    Brain cancer ([place])    Chronic kidney disease    renal cancer   Constipation    Depression    Hypertension    Low testosterone in male    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   Seizures ([place])    last seizure 01/2019 - controlled since on keppra   Wears glasses    Past Surgical History:  Past Surgical History:  Procedure Laterality Date   insertion port-a-cath  2018   IR GENERIC HISTORICAL  11/18/2015   IR FLUORO GUIDED NEEDLE PLC ASPIRATION/INJECTION LOC 11/18/2015 [name], MD [facility]-INTERV RAD   IR GENERIC HISTORICAL  05/16/2016   IR FLUORO GUIDE PORT INSERTION RIGHT 05/16/2016 [name], MD [facility]-INTERV RAD   IR GENERIC HISTORICAL  05/16/2016   IR US GUIDE VASC ACCESS RIGHT 05/16/2016 [name], MD [facility]-INTERV RAD   RADIOLOGY WITH ANESTHESIA N/A 06/16/2019   Procedure: MRI BRAIN WITH AND WITHOUT CONTRAST WITH ANESTHESIA;  Surgeon: Radiologist, Medication, MD;  Location: [facility];  Service: Radiology;  Laterality: N/A;   Social History:  Social History   Socioeconomic History   Marital status: Single    Spouse name: Not on file   Number of children: Not on file   Years of education: Not on file   Highest education level: Not on file  Occupational History   Not on file  Tobacco Use   Smoking status: Never   Smokeless tobacco: Never  Vaping Use   Vaping Use: Never used  Substance and Sexual Activity   Alcohol use: Not Currently    Alcohol/week: 1.0 standard drink     Types: 1 Glasses of wine per week   Drug use: Yes    Types: Marijuana    Comment: Last use 06/13/19   Sexual activity: Not Currently  Other Topics Concern   Not on file  Social History Narrative   Not on file   Social Determinants of Health   Financial Resource Strain: Not on file  Food Insecurity: Not on file  Transportation Needs: Not on file  Physical Activity: Not on file  Stress: Not on file  Social  Connections: Not on file  Intimate Partner Violence: Not on file   Family History:  Family History  Problem Relation Age of Onset   Cancer Grandchild        lymphoma    Review of Systems: Constitutional: Doesn't report fevers, chills or abnormal weight loss Eyes: Doesn't report blurriness of vision Ears, nose, mouth, throat, and face: Doesn't report sore throat Respiratory: Doesn't report cough, dyspnea or wheezes Cardiovascular: Doesn't report palpitation, chest discomfort  Gastrointestinal:  Doesn't report nausea, constipation, diarrhea GU: Doesn't report incontinence Skin: Doesn't report skin rashes Neurological: Per HPI Musculoskeletal: Doesn't report joint pain Behavioral/Psych: Doesn't report anxiety  Physical Exam: Vitals:   08/15/20 1015  BP: 132/85  Pulse: (!) 58  Resp: 20  Temp: (!) 97.5 F (36.4 C)  SpO2: 99%   KPS: 90. General: Alert, cooperative, pleasant, in no acute distress Head: Normal EENT: No conjunctival injection or scleral icterus.  Lungs: Resp effort normal Cardiac: Regular rate Abdomen: Non-distended abdomen Skin: No rashes cyanosis or petechiae. Extremities: No clubbing or edema  Neurologic Exam: Mental Status: Awake, alert, attentive to examiner. Oriented to self and environment. Language is fluent with intact comprehension.  Cranial Nerves: Visual acuity is grossly normal. Visual fields are full. Extra-ocular movements intact. No ptosis. Face is symmetric Motor: Tone and bulk are normal. Power is full in both arms and legs.  Reflexes are symmetric, no pathologic reflexes present.  Sensory: Intact to light touch Gait: Normal.   Labs: I have reviewed the data as listed    Component Value Date/Time   NA 133 (L) 08/11/2020 0815   NA 140 02/21/2017 1128   K 4.4 08/11/2020 0815   K 4.3 02/21/2017 1128   CL 98 08/11/2020 0815   CO2 27 08/11/2020 0815   CO2 26 02/21/2017 1128   GLUCOSE 151 (H) 08/11/2020 0815   GLUCOSE 115 02/21/2017 1128   BUN 14 08/11/2020 0815   BUN 16.6 02/21/2017 1128   CREATININE 0.70 08/11/2020 0846   CREATININE 0.71 08/11/2020 0815   CREATININE 0.8 02/21/2017 1128   CALCIUM 8.8 (L) 08/11/2020 0815   CALCIUM 9.4 02/21/2017 1128   PROT 6.2 (L) 08/11/2020 0815   PROT 7.5 02/21/2017 1128   ALBUMIN 3.9 08/11/2020 0815   ALBUMIN 4.2 02/21/2017 1128   AST 13 (L) 08/11/2020 0815   AST 20 02/21/2017 1128   ALT 19 08/11/2020 0815   ALT 43 02/21/2017 1128   ALKPHOS 59 08/11/2020 0815   ALKPHOS 104 02/21/2017 1128   BILITOT 0.3 08/11/2020 0815   BILITOT 0.36 02/21/2017 1128   GFRNONAA >60 08/11/2020 0815   GFRAA >60 11/05/2019 0957   Lab Results  Component Value Date   WBC 6.3 08/11/2020   NEUTROABS 5.1 08/11/2020   HGB 12.3 (L) 08/11/2020   HCT 37.8 (L) 08/11/2020   MCV 90.6 08/11/2020   PLT 271 08/11/2020    Imaging:  MR BRAIN W WO CONTRAST  Result Date: 08/10/2020 CLINICAL DATA:  Brain/CNS neoplasm, assess treatment response. EXAM: MRI HEAD WITHOUT AND WITH CONTRAST TECHNIQUE: Multiplanar, multiecho pulse sequences of the brain and surrounding structures were obtained without and with intravenous contrast. CONTRAST:  64mL MULTIHANCE GADOBENATE DIMEGLUMINE 529 MG/ML IV SOLN COMPARISON:  May 04, 2020. FINDINGS: Brain: No acute infarct, acute hemorrhage, hydrocephalus, or extra-axial fluid collection. 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 Enhancing lesions consistent with metastatic disease are  again identified and labeled on series 17. Increased size: 15  mm lesion in the anteromedial left frontal lobe (image 128), increased in size (previously 13 mm when remeasured) Stable or decreased size: 11 mm lesion in the high left frontal lobe, unchanged (image 145) Punctate lesion in the inferior left cerebellum, unchanged (image 17). Punctate lesion in the lateral right cerebellum (image 45). Ill-defined 6 mm lesion the right occipital lobe (image 69), similar versus slightly decreased in size (previously 8 mm). 6 mm lesion in the left parietal lobe (image 108), similar. 10 mm lesion in the left occipital lobe (image 51). Possible new lesions: New punctate focus of enhancement in the right occipital cortex (series 17, image 96; series 19, image 13) Vascular: Major arterial flow voids are maintained at the skull base. Skull and upper cervical spine: Normal marrow signal. Sinuses/Orbits: Mild scattered paranasal sinus mucosal thickening. Unremarkable orbits. Other: No sizable mastoid effusions. IMPRESSION: 1. Slight increase in a 15 mm lesion in the anteromedial left frontal lobe (previously 13 mm). 2. New punctate focus of enhancement in the right occipital cortex (series 17, image 96; series 19, image 13) is suspicious for a new metastasis. 3. Other metastatic lesions of are stable or slightly decreased in size, as detailed above. Electronically Signed   By: Margaretha Sheffield MD   On: 08/10/2020 16:57   CT CHEST ABDOMEN PELVIS W CONTRAST  Result Date: 08/11/2020 CLINICAL DATA:  Metastatic renal cell carcinoma restaging EXAM: CT CHEST, ABDOMEN, AND PELVIS WITH CONTRAST TECHNIQUE: Multidetector CT imaging of the chest, abdomen and pelvis was performed following the standard protocol during bolus administration of intravenous contrast. CONTRAST:  111m OMNIPAQUE IOHEXOL 300 MG/ML SOLN, additional oral enteric contrast COMPARISON:  02/08/2020 FINDINGS: CT CHEST FINDINGS Cardiovascular: Right chest port catheter.  Aortic atherosclerosis. Normal heart size. Left coronary artery calcifications. No pericardial effusion. Mediastinum/Nodes: No enlarged mediastinal, hilar, or axillary lymph nodes. Thyroid gland, trachea, and esophagus demonstrate no significant findings. Lungs/Pleura: Lungs are clear. No pleural effusion or pneumothorax. Musculoskeletal: No chest wall mass. CT ABDOMEN PELVIS FINDINGS Hepatobiliary: There is a new hypodense lesion of the posterior liver dome, hepatic segment VII, measuring 1.6 x 1.1 cm (series 2, image 55). There is an unchanged, benign cyst of the central right lobe of the liver (series 2, image 53). Multiple small gallstones. No gallbladder wall thickening, or biliary dilatation. Pancreas: Unremarkable. No pancreatic ductal dilatation or surrounding inflammatory changes. Spleen: Normal in size without significant abnormality. Adrenals/Urinary Tract: Multiple bilateral adrenal nodules, not significantly changed. Interval enlargement of a hypodense, partially exophytic mass of the posterior superior pole of the left kidney, measuring 3.7 x 3.7 cm, previously 3.4 x 3.2 cm when measured similarly (series 2, image 66). Mild thickening of the urinary bladder wall. There is a 0.7 cm endoluminal nodule of the left aspect of the bladder dome, which in retrospect is enlarged compared to prior examination, at which time it measured no greater than 2-3 mm (series 5, image 54). Stomach/Bowel: Stomach is within normal limits. Appendix appears normal. No evidence of bowel wall thickening, distention, or inflammatory changes. Vascular/Lymphatic: Aortic atherosclerosis. No enlarged abdominal or pelvic lymph nodes. Reproductive: Prostatomegaly. Other: No abdominal wall hernia or abnormality. No abdominopelvic ascites. Musculoskeletal: Unchanged sclerotic lesion of the T1 vertebral body (series 6, image 82). Increasing lysis of a sclerotic lesion of the right second rib head (series 2, image 10). There has been  significant interval enlargement of a lytic lesion of the T9 vertebral body, measuring approximately 3.4 x 2.7 cm, previously 2.0 x 2.0 cm (series 2, image 44).  Additional unchanged lytic lesions throughout the spine and pelvis, including of the right aspect of T11 and the adjacent right eleventh rib head (series 2, image 54), the right aspect of L4 (series 2, image 88), and the left anterior superior acetabulum (series 2, image 115). Unchanged wedge deformity of the L1 vertebral body without evident associated lytic or sclerotic lesion. IMPRESSION: 1. Interval enlargement of a hypodense, partially exophytic mass of the posterior superior pole of the left kidney, measuring 3.7 x 3.7 cm, previously 3.4 x 3.2 cm when measured similarly. Findings are consistent with worsened renal cell carcinoma. 2. New hypodense lesion of the posterior liver dome, hepatic segment VII, measuring 1.6 x 1.1 cm, consistent with a new hepatic metastasis. 3. Interval enlargement of a lytic lesion of the T9 vertebral body, and increasing lysis of a sclerotic lesion of the right second rib head. Findings are consistent with worsened osseous metastatic disease. Multiple additional osseous lesions detailed above are unchanged. 4. There is a 0.7 cm endoluminal nodule of the left aspect of the bladder dome, which in retrospect is enlarged compared to prior examination, at which time it measured no greater than 2-3 mm. This is of uncertain significance, although suspicious for a small bladder malignancy, renal cell metastasis to the bladder mucosa less favored although a possible differential consideration. These results will be called to the ordering clinician or representative by the Radiologist Assistant, and communication documented in the PACS or [employer]. Aortic Atherosclerosis (ICD10-I70.0). Electronically Signed   By: Eddie Candle M.D.   On: 08/11/2020 13:37     [facility] Clinician Interpretation: I have personally reviewed the  radiological images as listed.  My interpretation, in the context of the patient's clinical presentation, is treatment effect vs true progression   Assessment/Plan Brain metastases [facility]) [C79.31]  Phillip [name] is clinically stable today.  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    New lesion in right occipital lobe was faintly visible on prior study.  We will continue to monitor this lesion as well.  Otherwise continues to demonstrate good cognitive and motor function.  He should continue Keppra 545m BID for seizure prevention.  We ask that JBoswellreturn to [name] in 3 months following next brain MRI, or sooner as needed.  We appreciate the opportunity to participate in the care of Phillip [name]   All questions were answered. The patient knows to call the [name] with any problems, questions or concerns. No barriers to learning were detected.  I have spent a total of 30 minutes of face-to-face and non-face-to-face time, excluding clinical staff time, preparing to see patient, ordering tests and/or medications, counseling the patient, and independently interpreting results and communicating results to the patient/family/caregiver    ZVentura Sellers MD Medical Director of Neuro-Oncology [facility] WKidron06/20/22 4:22 PM

## 2020-08-19 ENCOUNTER — Ambulatory Visit: Payer: PPO | Admitting: Nurse Practitioner

## 2020-08-24 ENCOUNTER — Other Ambulatory Visit: Payer: Self-pay | Admitting: Radiation Therapy

## 2020-09-06 ENCOUNTER — Other Ambulatory Visit: Payer: Self-pay | Admitting: Oncology

## 2020-09-06 ENCOUNTER — Other Ambulatory Visit: Payer: Self-pay

## 2020-09-06 ENCOUNTER — Other Ambulatory Visit (HOSPITAL_COMMUNITY): Payer: Self-pay

## 2020-09-06 ENCOUNTER — Other Ambulatory Visit: Payer: Self-pay | Admitting: Nurse Practitioner

## 2020-09-06 DIAGNOSIS — C649 Malignant neoplasm of unspecified kidney, except renal pelvis: Secondary | ICD-10-CM

## 2020-09-06 MED ORDER — HYDROCORTISONE 10 MG PO TABS
ORAL_TABLET | Freq: Every day | ORAL | 3 refills | Status: DC
  Filled 2020-09-06: qty 90, 30d supply, fill #0
  Filled 2020-10-04: qty 90, 30d supply, fill #1
  Filled 2020-11-04: qty 90, 30d supply, fill #2
  Filled 2020-12-08: qty 90, 30d supply, fill #3

## 2020-09-06 MED ORDER — OXYCODONE HCL ER 10 MG PO T12A
EXTENDED_RELEASE_TABLET | Freq: Two times a day (BID) | ORAL | 0 refills | Status: DC
  Filled 2020-09-06: qty 120, 30d supply, fill #0

## 2020-09-06 NOTE — Telephone Encounter (Signed)
Refill request

## 2020-09-07 ENCOUNTER — Other Ambulatory Visit (HOSPITAL_COMMUNITY): Payer: Self-pay

## 2020-09-08 ENCOUNTER — Other Ambulatory Visit (HOSPITAL_COMMUNITY): Payer: Self-pay

## 2020-09-08 MED ORDER — LOSARTAN POTASSIUM-HCTZ 100-12.5 MG PO TABS
1.0000 | ORAL_TABLET | Freq: Every day | ORAL | 1 refills | Status: DC
  Filled 2020-09-08: qty 30, 30d supply, fill #0
  Filled 2020-10-04: qty 30, 30d supply, fill #1

## 2020-09-21 DIAGNOSIS — I10 Essential (primary) hypertension: Secondary | ICD-10-CM | POA: Diagnosis not present

## 2020-09-21 DIAGNOSIS — E785 Hyperlipidemia, unspecified: Secondary | ICD-10-CM | POA: Diagnosis not present

## 2020-09-21 DIAGNOSIS — C649 Malignant neoplasm of unspecified kidney, except renal pelvis: Secondary | ICD-10-CM | POA: Diagnosis not present

## 2020-09-22 ENCOUNTER — Inpatient Hospital Stay: Payer: PPO

## 2020-09-22 ENCOUNTER — Other Ambulatory Visit (HOSPITAL_COMMUNITY): Payer: Self-pay

## 2020-09-22 ENCOUNTER — Other Ambulatory Visit: Payer: Self-pay

## 2020-09-22 ENCOUNTER — Inpatient Hospital Stay: Payer: PPO | Attending: Oncology | Admitting: Nurse Practitioner

## 2020-09-22 ENCOUNTER — Encounter: Payer: Self-pay | Admitting: Nurse Practitioner

## 2020-09-22 VITALS — BP 135/71 | HR 60 | Temp 97.9°F | Resp 20 | Ht 70.0 in | Wt 240.8 lb

## 2020-09-22 DIAGNOSIS — C649 Malignant neoplasm of unspecified kidney, except renal pelvis: Secondary | ICD-10-CM

## 2020-09-22 DIAGNOSIS — R531 Weakness: Secondary | ICD-10-CM | POA: Insufficient documentation

## 2020-09-22 DIAGNOSIS — R4701 Aphasia: Secondary | ICD-10-CM | POA: Diagnosis not present

## 2020-09-22 DIAGNOSIS — K769 Liver disease, unspecified: Secondary | ICD-10-CM | POA: Insufficient documentation

## 2020-09-22 DIAGNOSIS — Z79899 Other long term (current) drug therapy: Secondary | ICD-10-CM | POA: Diagnosis not present

## 2020-09-22 DIAGNOSIS — C7931 Secondary malignant neoplasm of brain: Secondary | ICD-10-CM | POA: Insufficient documentation

## 2020-09-22 DIAGNOSIS — G893 Neoplasm related pain (acute) (chronic): Secondary | ICD-10-CM | POA: Insufficient documentation

## 2020-09-22 DIAGNOSIS — I10 Essential (primary) hypertension: Secondary | ICD-10-CM | POA: Insufficient documentation

## 2020-09-22 DIAGNOSIS — C642 Malignant neoplasm of left kidney, except renal pelvis: Secondary | ICD-10-CM | POA: Insufficient documentation

## 2020-09-22 DIAGNOSIS — G936 Cerebral edema: Secondary | ICD-10-CM | POA: Diagnosis not present

## 2020-09-22 DIAGNOSIS — C7972 Secondary malignant neoplasm of left adrenal gland: Secondary | ICD-10-CM | POA: Insufficient documentation

## 2020-09-22 DIAGNOSIS — C7951 Secondary malignant neoplasm of bone: Secondary | ICD-10-CM | POA: Diagnosis not present

## 2020-09-22 LAB — CMP (CANCER CENTER ONLY)
ALT: 16 U/L (ref 0–44)
AST: 13 U/L — ABNORMAL LOW (ref 15–41)
Albumin: 3.6 g/dL (ref 3.5–5.0)
Alkaline Phosphatase: 59 U/L (ref 38–126)
Anion gap: 6 (ref 5–15)
BUN: 18 mg/dL (ref 8–23)
CO2: 29 mmol/L (ref 22–32)
Calcium: 8.3 mg/dL — ABNORMAL LOW (ref 8.9–10.3)
Chloride: 96 mmol/L — ABNORMAL LOW (ref 98–111)
Creatinine: 0.71 mg/dL (ref 0.61–1.24)
GFR, Estimated: >60 mL/min (ref >60)
Glucose, Bld: 100 mg/dL — ABNORMAL HIGH (ref 70–99)
Potassium: 4.5 mmol/L (ref 3.5–5.1)
Sodium: 131 mmol/L — ABNORMAL LOW (ref 135–145)
Total Bilirubin: 0.3 mg/dL (ref 0.3–1.2)
Total Protein: 5.9 g/dL — ABNORMAL LOW (ref 6.5–8.1)

## 2020-09-22 LAB — CBC WITH DIFFERENTIAL (CANCER CENTER ONLY)
Abs Immature Granulocytes: 0.02 10*3/uL (ref 0.00–0.07)
Basophils Absolute: 0 10*3/uL (ref 0.0–0.1)
Basophils Relative: 1 %
Eosinophils Absolute: 0.6 10*3/uL — ABNORMAL HIGH (ref 0.0–0.5)
Eosinophils Relative: 8 %
HCT: 37.2 % — ABNORMAL LOW (ref 39.0–52.0)
Hemoglobin: 12 g/dL — ABNORMAL LOW (ref 13.0–17.0)
Immature Granulocytes: 0 %
Lymphocytes Relative: 30 %
Lymphs Abs: 2.2 10*3/uL (ref 0.7–4.0)
MCH: 29.3 pg (ref 26.0–34.0)
MCHC: 32.3 g/dL (ref 30.0–36.0)
MCV: 90.7 fL (ref 80.0–100.0)
Monocytes Absolute: 0.5 10*3/uL (ref 0.1–1.0)
Monocytes Relative: 7 %
Neutro Abs: 4.1 10*3/uL (ref 1.7–7.7)
Neutrophils Relative %: 54 %
Platelet Count: 240 10*3/uL (ref 150–400)
RBC: 4.1 MIL/uL — ABNORMAL LOW (ref 4.22–5.81)
RDW: 15.3 % (ref 11.5–15.5)
WBC Count: 7.5 10*3/uL (ref 4.0–10.5)
nRBC: 0 % (ref 0.0–0.2)

## 2020-09-22 MED ORDER — PREDNISONE 50 MG PO TABS
ORAL_TABLET | ORAL | 0 refills | Status: DC
Start: 2020-09-22 — End: 2021-02-14
  Filled 2020-09-22: qty 3, 1d supply, fill #0

## 2020-09-22 NOTE — Progress Notes (Signed)
[facility] OFFICE PROGRESS NOTE   Diagnosis: Renal cell carcinoma  INTERVAL HISTORY:   [name] returns as scheduled.  He cabozantinib.  In general he is feeling well.  No significant rash.  No diarrhea.  Pain is unchanged.  He continues OxyContin with oxycodone as needed.  Objective:  Vital signs in last 24 hours:  Blood pressure 135/71, pulse 60, temperature 97.9 F (36.6 C), temperature source Oral, resp. rate 20, height '5\' 10"'$  (1.778 m), weight 240 lb 12.8 oz (109.2 kg), SpO2 99 %.    HEENT: No thrush or ulcers. Resp: Lungs clear bilaterally. Cardio: Regular rate and rhythm. GI: Abdomen soft and nontender.  No hepatomegaly. Vascular: Trace edema lower leg bilaterally. Port-A-Cath without erythema.  Lab Results:  Lab Results  Component Value Date   WBC 7.5 09/22/2020   HGB 12.0 (L) 09/22/2020   HCT 37.2 (L) 09/22/2020   MCV 90.7 09/22/2020   PLT 240 09/22/2020   NEUTROABS 4.1 09/22/2020    Imaging:  No results found.  Medications: I have reviewed the patient's current medications.  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, L4 nerve compression Biopsy of the L4 mass 11/18/2015 confirmed metastatic renal cell carcinoma, clear cell type CTs of the chest, abdomen, and pelvis 11/18/2015-right lower lobe nodule, expansile lytic lesion at the right 11th rib/costal vertebral junction, left renal mass, expansile lesion involving the L4 vertebra, lytic lesion at the left acetabulum, and a low-attenuation liver lesion Initiation of SRS to L4 12/02/2015, Completed 12/12/2015 Initiation of Pazopanib 12/30/2015 Pazopanib placed on hold 02/06/2016 secondary to elevated liver enzymes Pazopanib resumed 03/07/2016 at a dose of 400 mg daily  Pazopanib discontinued 03/19/2016 secondary to elevated liver enzymes Restaging CTs 04/02/2016-stable left renal mass, decreased soft tissue component associated with the L4  metastasis, increased soft tissue component associated with the right 11th rib metastasis with increased T11 bony destruction, increased sclerosis at the left acetabulum lesion Cycle 1 nivolumab 04/12/2016 Cycle 2 nivolumab 04/26/2016 Cycle 3 nivolumab 05/11/2016 Cycle 4 nivolumab 05/24/2016 Cycle 5 nivolumab 06/08/2016 MRI lumbar spine 06/21/2016-unchanged tumor at L3, increased size of retroperitoneal lymph nodes compared to a CT from 04/02/2016 Cycle 6 nivolumab  06/22/2016 CTs chest, abdomen, and pelvis 07/04/2016-enlargement of the left renal mass, right adrenal nodule, left hilar and peritoneal lymph nodes, enlargement of left acetabular lesion. Stable lung nodules. Cycle 7 nivolumab 07/06/2016 Cycle 8 nivolumab 07/20/2016 Cycle 9 nivolumab 08/02/2016 Cycle 10 nivolumab 08/20/2016 Restaging CT 09/03/2016 evaluation with stable disease Cycle 11 nivolumab 09/05/2016 Cycle 12 nivolumab 09/19/2016 Cycle 13 nivolumab 10/03/2016 Cycle 14 nivolumab 10/17/2016 Cycle 15 nivolumab 10/31/2016 Cycle 16 nivolumab 11/14/2016 (changed to monthly schedule) Cycle 17 nivolumab 12/19/2016 CTs 01/21/2017-increased left renal mass, increased size of adrenal metastases, increased lytic bone lesions, increased left lung nodule, persistent tumor at L4 with probable epidural component Initiation of Cabozantinib 01/28/2017 Restaging CTs 05/30/2017- decreased size of left hilar mass, left renal mass, retroperitoneal adenopathy, and adrenal metastasis.  Healing bone lesions. Cabozantinib continued CTs 10/21/2017- interval enlargement left hilar lymph node; stable rib lesions; stable mass left renal cortex; stable mildly nodular adrenal glands; stable lytic lesions within the pelvis and spine. Cabozantinib continued CTs 02/21/2018- enlargement of an AP window lymph node.  Mildly enlarged left hilar lymph node is unchanged.  Primary renal cell carcinoma involving the upper pole of the left kidney appears similar.   Stable enlarged right periaortic lymph node adjacent to the renal vessels.  Stable multifocal bony metastatic disease. Cabozantinib continued  CTs 07/29/2018- stable 15 mm AP window nodes; stable left hilar node; subcarinal node slightly larger; right periaortic node 16 mm, previously 12 mm; portacaval node 24 mm, previously 16 mm; 15 mm node superior to the pancreatic head has enlarged; interval increase nodularity of both adrenal glands; stable left kidney mass; multifocal bony metastatic disease not significantly changed. Cabozantinib continued CTs 11/06/2018- moderate improvement in thoracic adenopathy; minimal improvement abdominal adenopathy; left kidney upper pole mass and various lytic expansile bone lesions stable; mild increase in nodularity of left adrenal gland; right adrenal gland nodularity stable. 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  Peripheral enhancing masses consistent with metastases. No hemorrhage. MRI brain 03/11/2019-7 enhancing brain masses consistent with metastatic disease SRS to 7 brain lesions, treatment given 03/19/2019, 03/23/2019, and 03/25/2019 CTs 04/09/2019-decrease in left renal mass, bilateral adrenal nodules, AP window and porta hepatic adenopathy.  New 9 mm right lower lobe nodule.  Improved lytic lesion of the left acetabulum.  Other bone lesions are stable. Cabozantinib continued MRI brain 07/17/2019-resolution of 4 mm treated lesion in the right frontal cortex, 6 remaining treated lesions have decreased in size, new punctate metastasis in the superior right cerebellum SRS to right cerebellar lesion 07/30/2019 CTs 08/17/2019-previously noted right lower lobe nodule resolved, stable left kidney mass, mixed lytic/sclerotic bone lesions in the thoracolumbar spine, right posterior ribs, and left acetabulum-unchanged, no evidence of progressive disease   Cabozantinib continued MRI brain 10/23/2019-stable to slight decrease in size of multiple enhancing intracranial lesions.  Slight increase in surrounding edema in the medial left frontal lobe.  No new lesions present. MRI brain 01/29/2020-multiple enhancing brain lesions, some hemorrhagic, some with mild enlargement-treatment effect? CTs 02/08/2020-no thoracic metastases, enlargement of the left renal mass, enlargement of lytic lesion at T9, other lytic lesions unchanged MRI brain 05/04/2020-no new lesions, slight enlargement of a right occipital lesion, other lesions are stable or decreased in size CTs 08/11/2020-enlargement of left kidney mass, new 1.6 cm segment 7 liver lesion, enlargement of a lytic lesion at T9, increased lysis of a sclerotic lesion at the right second rib, 0.7 cm endoluminal nodule at the bladder dome-enlarged Brain MRI 08/10/2020-slight increase in a 15 mm left frontal lobe lesion, new punctate focus in the right occipital cortex, other lesions stable or slightly decreased Pain secondary to #1-managed by Dr. Lovenia Shuck.  Improved Hypertension Elevated transaminases 02/06/2016- Pazopanib placed on hold Liver enzymes normal 03/07/2016 Port-A-Cath placement 05/16/2016 Malaise/anorexia 09/05/2016. Cortisol and testosterone levels low. Hydrocortisone and testosterone replacement initiated. Conjunctival/scleral erythema 09/19/2016-resolved with steroid eyedrops Proximal right leg weakness. 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 Hypercalcemia status post Zometa 01/23/2017-resolved Pruritic rash following IV contrast 10/21/2017; rash following IV contrast 02/21/2018 despite prednisone/Benadryl premedication Brief episodes of expressive aphasia October and December 2020        Disposition: Phillip [name] appears unchanged.  There is no clinical evidence of disease progression.  He will continue cabozantinib.  Plan for restaging CTs in approximately 6  weeks.  For pain he will continue OxyContin with oxycodone as needed.  We will see him in follow-up on 11/03/2020 with CTs a few days prior.    [name] ANP/GNP-BC   09/22/2020  2:51 PM

## 2020-09-29 ENCOUNTER — Other Ambulatory Visit (HOSPITAL_COMMUNITY): Payer: Self-pay

## 2020-10-04 ENCOUNTER — Encounter: Payer: Self-pay | Admitting: Oncology

## 2020-10-04 ENCOUNTER — Other Ambulatory Visit: Payer: Self-pay | Admitting: Oncology

## 2020-10-04 ENCOUNTER — Other Ambulatory Visit: Payer: Self-pay | Admitting: Nurse Practitioner

## 2020-10-04 ENCOUNTER — Other Ambulatory Visit (HOSPITAL_COMMUNITY): Payer: Self-pay

## 2020-10-04 DIAGNOSIS — C649 Malignant neoplasm of unspecified kidney, except renal pelvis: Secondary | ICD-10-CM

## 2020-10-04 MED ORDER — OXYCODONE HCL ER 10 MG PO T12A
EXTENDED_RELEASE_TABLET | Freq: Two times a day (BID) | ORAL | 0 refills | Status: DC
  Filled 2020-10-04: qty 120, 30d supply, fill #0

## 2020-10-04 MED ORDER — LEVETIRACETAM 500 MG PO TABS
ORAL_TABLET | Freq: Two times a day (BID) | ORAL | 2 refills | Status: DC
Start: 2020-10-04 — End: 2021-01-10
  Filled 2020-10-04: qty 60, 30d supply, fill #0
  Filled 2020-11-04: qty 60, 30d supply, fill #1
  Filled 2020-12-08: qty 60, 30d supply, fill #2

## 2020-10-04 MED ORDER — OXYCODONE HCL 5 MG PO TABS
5.0000 mg | ORAL_TABLET | Freq: Four times a day (QID) | ORAL | 0 refills | Status: DC | PRN
  Filled 2020-10-04: qty 60, 15d supply, fill #0

## 2020-10-04 MED ORDER — CABOMETYX 40 MG PO TABS
ORAL_TABLET | ORAL | 1 refills | Status: DC
  Filled 2020-10-04: qty 30, 30d supply, fill #0
  Filled 2020-10-28: qty 30, 30d supply, fill #1

## 2020-10-04 MED FILL — Mirtazapine Tab 30 MG: ORAL | 90 days supply | Qty: 90 | Fill #1 | Status: AC

## 2020-10-04 NOTE — Telephone Encounter (Signed)
refill 

## 2020-10-17 ENCOUNTER — Other Ambulatory Visit: Payer: Self-pay | Admitting: *Deleted

## 2020-10-17 DIAGNOSIS — C7931 Secondary malignant neoplasm of brain: Secondary | ICD-10-CM

## 2020-10-28 ENCOUNTER — Other Ambulatory Visit (HOSPITAL_COMMUNITY): Payer: Self-pay

## 2020-11-01 ENCOUNTER — Other Ambulatory Visit: Payer: Self-pay

## 2020-11-01 ENCOUNTER — Inpatient Hospital Stay: Payer: PPO

## 2020-11-01 ENCOUNTER — Inpatient Hospital Stay: Payer: PPO | Attending: Oncology

## 2020-11-01 ENCOUNTER — Ambulatory Visit (HOSPITAL_BASED_OUTPATIENT_CLINIC_OR_DEPARTMENT_OTHER)
Admission: RE | Admit: 2020-11-01 | Discharge: 2020-11-01 | Disposition: A | Discharge status: Home / Self Care | Payer: PPO | Source: Ambulatory Visit | Attending: Nurse Practitioner | Admitting: Nurse Practitioner

## 2020-11-01 DIAGNOSIS — K8020 Calculus of gallbladder without cholecystitis without obstruction: Secondary | ICD-10-CM | POA: Diagnosis not present

## 2020-11-01 DIAGNOSIS — C787 Secondary malignant neoplasm of liver and intrahepatic bile duct: Secondary | ICD-10-CM | POA: Insufficient documentation

## 2020-11-01 DIAGNOSIS — J439 Emphysema, unspecified: Secondary | ICD-10-CM | POA: Insufficient documentation

## 2020-11-01 DIAGNOSIS — C7951 Secondary malignant neoplasm of bone: Secondary | ICD-10-CM | POA: Insufficient documentation

## 2020-11-01 DIAGNOSIS — C7900 Secondary malignant neoplasm of unspecified kidney and renal pelvis: Secondary | ICD-10-CM | POA: Diagnosis not present

## 2020-11-01 DIAGNOSIS — C649 Malignant neoplasm of unspecified kidney, except renal pelvis: Secondary | ICD-10-CM

## 2020-11-01 DIAGNOSIS — C642 Malignant neoplasm of left kidney, except renal pelvis: Secondary | ICD-10-CM | POA: Diagnosis not present

## 2020-11-01 DIAGNOSIS — C7931 Secondary malignant neoplasm of brain: Secondary | ICD-10-CM | POA: Insufficient documentation

## 2020-11-01 DIAGNOSIS — R918 Other nonspecific abnormal finding of lung field: Secondary | ICD-10-CM | POA: Diagnosis not present

## 2020-11-01 DIAGNOSIS — I700 Atherosclerosis of aorta: Secondary | ICD-10-CM | POA: Diagnosis not present

## 2020-11-01 DIAGNOSIS — Z9221 Personal history of antineoplastic chemotherapy: Secondary | ICD-10-CM | POA: Insufficient documentation

## 2020-11-01 LAB — CBC WITH DIFFERENTIAL (CANCER CENTER ONLY)
Abs Immature Granulocytes: 0.01 10*3/uL (ref 0.00–0.07)
Basophils Absolute: 0 10*3/uL (ref 0.0–0.1)
Basophils Relative: 0 %
Eosinophils Absolute: 0 10*3/uL (ref 0.0–0.5)
Eosinophils Relative: 0 %
HCT: 39.7 % (ref 39.0–52.0)
Hemoglobin: 12.9 g/dL — ABNORMAL LOW (ref 13.0–17.0)
Immature Granulocytes: 0 %
Lymphocytes Relative: 23 %
Lymphs Abs: 1.4 10*3/uL (ref 0.7–4.0)
MCH: 28.8 pg (ref 26.0–34.0)
MCHC: 32.5 g/dL (ref 30.0–36.0)
MCV: 88.6 fL (ref 80.0–100.0)
Monocytes Absolute: 0.1 10*3/uL (ref 0.1–1.0)
Monocytes Relative: 1 %
Neutro Abs: 4.6 10*3/uL (ref 1.7–7.7)
Neutrophils Relative %: 76 %
Platelet Count: 272 10*3/uL (ref 150–400)
RBC: 4.48 MIL/uL (ref 4.22–5.81)
RDW: 15.2 % (ref 11.5–15.5)
WBC Count: 6.1 10*3/uL (ref 4.0–10.5)
nRBC: 0 % (ref 0.0–0.2)

## 2020-11-01 LAB — CMP (CANCER CENTER ONLY)
ALT: 25 U/L (ref 0–44)
AST: 16 U/L (ref 15–41)
Albumin: 3.9 g/dL (ref 3.5–5.0)
Alkaline Phosphatase: 79 U/L (ref 38–126)
Anion gap: 10 (ref 5–15)
BUN: 15 mg/dL (ref 8–23)
CO2: 27 mmol/L (ref 22–32)
Calcium: 9 mg/dL (ref 8.9–10.3)
Chloride: 97 mmol/L — ABNORMAL LOW (ref 98–111)
Creatinine: 0.74 mg/dL (ref 0.61–1.24)
GFR, Estimated: >60 mL/min (ref >60)
Glucose, Bld: 178 mg/dL — ABNORMAL HIGH (ref 70–99)
Potassium: 4.4 mmol/L (ref 3.5–5.1)
Sodium: 134 mmol/L — ABNORMAL LOW (ref 135–145)
Total Bilirubin: 0.3 mg/dL (ref 0.3–1.2)
Total Protein: 6.8 g/dL (ref 6.5–8.1)

## 2020-11-01 MED ORDER — IOHEXOL 350 MG/ML SOLN
75.0000 mL | Freq: Once | INTRAVENOUS | Status: AC | PRN
  Administered 2020-11-01: 75 mL via INTRAVENOUS

## 2020-11-01 MED ORDER — HEPARIN SOD (PORK) LOCK FLUSH 100 UNIT/ML IV SOLN
500.0000 [IU] | Freq: Once | INTRAVENOUS | Status: AC
  Administered 2020-11-01: 500 [IU] via INTRAVENOUS

## 2020-11-02 ENCOUNTER — Other Ambulatory Visit (HOSPITAL_COMMUNITY): Payer: Self-pay

## 2020-11-03 ENCOUNTER — Other Ambulatory Visit: Payer: Self-pay

## 2020-11-03 ENCOUNTER — Inpatient Hospital Stay: Payer: PPO | Admitting: Oncology

## 2020-11-03 VITALS — BP 130/61 | HR 68 | Temp 97.7°F | Resp 18 | Ht 70.0 in | Wt 238.2 lb

## 2020-11-03 DIAGNOSIS — C649 Malignant neoplasm of unspecified kidney, except renal pelvis: Secondary | ICD-10-CM

## 2020-11-03 DIAGNOSIS — C642 Malignant neoplasm of left kidney, except renal pelvis: Secondary | ICD-10-CM | POA: Diagnosis not present

## 2020-11-03 NOTE — Progress Notes (Signed)
OFFICE PROGRESS NOTE   Diagnosis: Renal cell carcinoma  INTERVAL HISTORY:   Phillip [name] returns as scheduled.  He continues cabozantinib.  Stable pain in the right leg.  He takes OxyContin twice daily.  He uses oxycodone as needed for breakthrough pain.  Good appetite.  No new complaint.  He is scheduled for a brain MRI tomorrow.  Objective:  Vital signs in last 24 hours:  Blood pressure 130/61, pulse 68, temperature 97.7 F (36.5 C), temperature source Oral, resp. rate 18, height '5\' 10"'$  (1.778 m), weight 238 lb 3.2 oz (108 kg), SpO2 98 %.    HEENT: No thrush or ulcers Resp: Lungs clear bilaterally Cardio: Regular rate and rhythm GI: No mass, no hepatosplenomegaly, nontender Vascular: No leg edema   Portacath/PICC-without erythema  Lab Results:  Lab Results  Component Value Date   WBC 6.1 11/01/2020   HGB 12.9 (L) 11/01/2020   HCT 39.7 11/01/2020   MCV 88.6 11/01/2020   PLT 272 11/01/2020   NEUTROABS 4.6 11/01/2020    CMP  Lab Results  Component Value Date   NA 134 (L) 11/01/2020   K 4.4 11/01/2020   CL 97 (L) 11/01/2020   CO2 27 11/01/2020   GLUCOSE 178 (H) 11/01/2020   BUN 15 11/01/2020   CREATININE 0.74 11/01/2020   CALCIUM 9.0 11/01/2020   PROT 6.8 11/01/2020   ALBUMIN 3.9 11/01/2020   AST 16 11/01/2020   ALT 25 11/01/2020   ALKPHOS 79 11/01/2020   BILITOT 0.3 11/01/2020   GFRNONAA >60 11/01/2020   GFRAA >60 11/05/2019    Lab Results  Component Value Date   CEA1 1.77 01/04/2016      Imaging:  CT CHEST ABDOMEN PELVIS W CONTRAST  Result Date: 11/02/2020 CLINICAL DATA:  Metastatic renal cell cancer.  Restaging. EXAM: CT CHEST, ABDOMEN, AND PELVIS WITH CONTRAST TECHNIQUE: Multidetector CT imaging of the chest, abdomen and pelvis was performed following the standard protocol during bolus administration of intravenous contrast. CONTRAST:  81m OMNIPAQUE IOHEXOL 350 MG/ML SOLN COMPARISON:  08/11/2020.  02/08/2020 FINDINGS:  CT CHEST FINDINGS Cardiovascular: The heart size is normal. No substantial pericardial effusion. Coronary artery calcification is evident. Mild atherosclerotic calcification is noted in the wall of the thoracic aorta. Right Port-A-Cath tip is positioned at the SVC/RA junction. Mediastinum/Nodes: No mediastinal lymphadenopathy. Soft tissue fullness in the left hilar region is stable. No right hilar lymphadenopathy. The esophagus has normal imaging features. There is no axillary lymphadenopathy. Lungs/Pleura: Centrilobular emphsyema noted. Interval increase in left suprahilar upper lobe volume loss with consolidative opacity (45/4). No other suspicious nodule or mass. No pleural effusion. Musculoskeletal: Similar appearance of metastatic disease involving thoracic spine and ribs. CT ABDOMEN PELVIS FINDINGS Hepatobiliary: 1.7 x 1.1 cm right hepatic lobe lesion measured previously is 2.4 x 1.6 cm today on image 55/series 2. 9 mm low-density lesion anterior left liver (segment II) on 54/2 was approximately 4 mm previously. 9 mm lesion seen in the medial segment left liver adjacent to the falciform ligament on 54/2 appears to be new in the interval. 6 mm medial segment left liver lesion adjacent to the gallbladder fossa on 57/2 is also new. 15 mm low-density lesion in the central left liver on 53/2 is stable and may be a cyst. Gallstones are identified in the gallbladder. No intrahepatic or extrahepatic biliary dilation. Pancreas: No focal mass lesion. No dilatation of the main duct. No intraparenchymal cyst. No peripancreatic edema. Spleen: No splenomegaly. No focal mass lesion. Adrenals/Urinary Tract:  Stable nodular thickening of both adrenal glands with dominant nodule in the left adrenal gland measuring 2 cm today compared to 2 cm previously (remeasured). Tiny hypodensity in the interpolar right kidney is stable. 3.7 x 3.7 cm upper pole left renal mass measured previously is 4.0 x 3.8 cm today, minimally progressed.  No filling defect in the left renal vein. No evidence for hydroureter. Bladder is decompressed. Subtle hyperdense/enhancing nodule is seen in the left bladder measuring about 7 mm on axial image 113/2, and visible on coronal 66/5 and sagittal 87/6. Stomach/Bowel: Stomach is unremarkable. No gastric wall thickening. No evidence of outlet obstruction. Duodenum is normally positioned as is the ligament of Treitz. No small bowel wall thickening. No small bowel dilatation. The terminal ileum is normal. The appendix is normal. No gross colonic mass. No colonic wall thickening. Diverticular changes are noted in the left colon without evidence of diverticulitis. Vascular/Lymphatic: There is moderate atherosclerotic calcification of the abdominal aorta without aneurysm. There is no gastrohepatic ligament lymphadenopathy. 14 mm short axis hepatoduodenal ligament lymph node on 64/2 is stable. No retroperitoneal or mesenteric lymphadenopathy. No pelvic sidewall lymphadenopathy. Reproductive: Prostate gland is enlarged. Other: No intraperitoneal free fluid. Musculoskeletal: Osseous metastases in the bony pelvis, sacrum, and lumbar spine are similar to prior. IMPRESSION: 1. Abnormal soft tissue in the superior left hilum with interval increase in left suprahilar upper lobe volume loss with consolidative opacity. Metastatic disease with central left upper lobe obstruction a concern. 2. Continued further increase in size of the left upper pole renal mass concerning for renal cell carcinoma. 3. Progression of hepatic metastases with enlargement of previously existing lesions and new lesions evident on today's study. 4. Similar appearance of bony metastases. 5. Similar appearance subtle hyperdense/enhancing nodule in the left bladder. Urothelial neoplasm could have this appearance. 6. Cholelithiasis. 7. Aortic Atherosclerosis (ICD10-I70.0) and Emphysema (ICD10-J43.9). Electronically Signed   By: Misty Stanley M.D.   On: 11/02/2020  09:01    Medications: I have reviewed the patient's current medications.   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, L4 nerve compression Biopsy of the L4 mass 11/18/2015 confirmed metastatic renal cell carcinoma, clear cell type CTs of the chest, abdomen, and pelvis 11/18/2015-right lower lobe nodule, expansile lytic lesion at the right 11th rib/costal vertebral junction, left renal mass, expansile lesion involving the L4 vertebra, lytic lesion at the left acetabulum, and a low-attenuation liver lesion Initiation of SRS to L4 12/02/2015, Completed 12/12/2015 Initiation of Pazopanib 12/30/2015 Pazopanib placed on hold 02/06/2016 secondary to elevated liver enzymes Pazopanib resumed 03/07/2016 at a dose of 400 mg daily  Pazopanib discontinued 03/19/2016 secondary to elevated liver enzymes Restaging CTs 04/02/2016-stable left renal mass, decreased soft tissue component associated with the L4 metastasis, increased soft tissue component associated with the right 11th rib metastasis with increased T11 bony destruction, increased sclerosis at the left acetabulum lesion Cycle 1 nivolumab 04/12/2016 Cycle 2 nivolumab 04/26/2016 Cycle 3 nivolumab 05/11/2016 Cycle 4 nivolumab 05/24/2016 Cycle 5 nivolumab 06/08/2016 MRI lumbar spine 06/21/2016-unchanged tumor at L3, increased size of retroperitoneal lymph nodes compared to a CT from 04/02/2016 Cycle 6 nivolumab  06/22/2016 CTs chest, abdomen, and pelvis 07/04/2016-enlargement of the left renal mass, right adrenal nodule, left hilar and peritoneal lymph nodes, enlargement of left acetabular lesion. Stable lung nodules. Cycle 7 nivolumab 07/06/2016 Cycle 8 nivolumab 07/20/2016 Cycle 9 nivolumab 08/02/2016 Cycle 10 nivolumab 08/20/2016 Restaging CT 09/03/2016 evaluation with stable disease Cycle 11 nivolumab 09/05/2016 Cycle 12 nivolumab 09/19/2016 Cycle 13 nivolumab  10/03/2016 Cycle 14 nivolumab  10/17/2016 Cycle 15 nivolumab 10/31/2016 Cycle 16 nivolumab 11/14/2016 (changed to monthly schedule) Cycle 17 nivolumab 12/19/2016 CTs 01/21/2017-increased left renal mass, increased size of adrenal metastases, increased lytic bone lesions, increased left lung nodule, persistent tumor at L4 with probable epidural component Initiation of Cabozantinib 01/28/2017 Restaging CTs 05/30/2017- decreased size of left hilar mass, left renal mass, retroperitoneal adenopathy, and adrenal metastasis.  Healing bone lesions. Cabozantinib continued CTs 10/21/2017- interval enlargement left hilar lymph node; stable rib lesions; stable mass left renal cortex; stable mildly nodular adrenal glands; stable lytic lesions within the pelvis and spine. Cabozantinib continued CTs 02/21/2018- enlargement of an AP window lymph node.  Mildly enlarged left hilar lymph node is unchanged.  Primary renal cell carcinoma involving the upper pole of the left kidney appears similar.  Stable enlarged right periaortic lymph node adjacent to the renal vessels.  Stable multifocal bony metastatic disease. Cabozantinib continued CTs 07/29/2018- stable 15 mm AP window nodes; stable left hilar node; subcarinal node slightly larger; right periaortic node 16 mm, previously 12 mm; portacaval node 24 mm, previously 16 mm; 15 mm node superior to the pancreatic head has enlarged; interval increase nodularity of both adrenal glands; stable left kidney mass; multifocal bony metastatic disease not significantly changed. Cabozantinib continued CTs 11/06/2018- moderate improvement in thoracic adenopathy; minimal improvement abdominal adenopathy; left kidney upper pole mass and various lytic expansile bone lesions stable; mild increase in nodularity of left adrenal gland; right adrenal gland nodularity stable. 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   Peripheral enhancing masses consistent with metastases. No hemorrhage. MRI brain 03/11/2019-7 enhancing brain masses consistent with metastatic disease SRS to 7 brain lesions, treatment given 03/19/2019, 03/23/2019, and 03/25/2019 CTs 04/09/2019-decrease in left renal mass, bilateral adrenal nodules, AP window and porta hepatic adenopathy.  New 9 mm right lower lobe nodule.  Improved lytic lesion of the left acetabulum.  Other bone lesions are stable. Cabozantinib continued MRI brain 07/17/2019-resolution of 4 mm treated lesion in the right frontal cortex, 6 remaining treated lesions have decreased in size, new punctate metastasis in the superior right cerebellum SRS to right cerebellar lesion 07/30/2019 CTs 08/17/2019-previously noted right lower lobe nodule resolved, stable left kidney mass, mixed lytic/sclerotic bone lesions in the thoracolumbar spine, right posterior ribs, and left acetabulum-unchanged, no evidence of progressive disease  Cabozantinib continued MRI brain 10/23/2019-stable to slight decrease in size of multiple enhancing intracranial lesions.  Slight increase in surrounding edema in the medial left frontal lobe.  No new lesions present. MRI brain 01/29/2020-multiple enhancing brain lesions, some hemorrhagic, some with mild enlargement-treatment effect? CTs 02/08/2020-no thoracic metastases, enlargement of the left renal mass, enlargement of lytic lesion at T9, other lytic lesions unchanged MRI brain 05/04/2020-no new lesions, slight enlargement of a right occipital lesion, other lesions are stable or decreased in size CTs 08/11/2020-enlargement of left kidney mass, new 1.6 cm segment 7 liver lesion, enlargement of a lytic lesion at T9, increased lysis of a sclerotic lesion at the right second rib, 0.7 cm endoluminal nodule at the bladder dome-enlarged Brain MRI 08/10/2020-slight increase in a 15 mm left frontal lobe lesion, new punctate focus in the right occipital cortex, other lesions stable or  slightly decreased CTs 11/01/2020-increase in left suprahilar opacity, enlargement of previous liver metastases, several new subcentimeter lesions, increase in left renal mass, similar appearance of bone metastases, stable hyperdense/enhancing nodule in the left bladder Pain secondary to #1-managed by  Dr. Lovenia Shuck.  Improved Hypertension Elevated transaminases 02/06/2016- Pazopanib placed on hold Liver enzymes normal 03/07/2016 Port-A-Cath placement 05/16/2016 Malaise/anorexia 09/05/2016. Cortisol and testosterone levels low. Hydrocortisone and testosterone replacement initiated. Conjunctival/scleral erythema 09/19/2016-resolved with steroid eyedrops Proximal right leg weakness. 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 Hypercalcemia status post Zometa 01/23/2017-resolved Pruritic rash following IV contrast 10/21/2017; rash following IV contrast 02/21/2018 despite prednisone/Benadryl premedication Brief episodes of expressive aphasia October and December 2020         Disposition: Phillip [name] appears unchanged.  I reviewed the CT findings and images with him.  There appears to be slow progression of disease while on cabozantinib.  He has been maintained on cabozantinib since December 2018.  He will continue the cabozantinib for now.  I will investigate additional systemic options such as combination immunotherapy, and immunotherapy with a different tyrosine kinase inhibitor.  He will return for an office visit and further discussion in 1 week.  He is scheduled for a restaging brain MRI tomorrow.  Betsy Coder, MD  11/03/2020  12:56 PM

## 2020-11-04 ENCOUNTER — Other Ambulatory Visit (HOSPITAL_COMMUNITY): Payer: Self-pay

## 2020-11-04 ENCOUNTER — Other Ambulatory Visit: Payer: Self-pay | Admitting: Nurse Practitioner

## 2020-11-04 ENCOUNTER — Ambulatory Visit
Admission: RE | Admit: 2020-11-04 | Discharge: 2020-11-04 | Disposition: A | Discharge status: Home / Self Care | Payer: PPO | Source: Ambulatory Visit | Attending: Internal Medicine | Admitting: Internal Medicine

## 2020-11-04 DIAGNOSIS — C7931 Secondary malignant neoplasm of brain: Secondary | ICD-10-CM | POA: Diagnosis not present

## 2020-11-04 DIAGNOSIS — C649 Malignant neoplasm of unspecified kidney, except renal pelvis: Secondary | ICD-10-CM

## 2020-11-04 DIAGNOSIS — Z85528 Personal history of other malignant neoplasm of kidney: Secondary | ICD-10-CM | POA: Diagnosis not present

## 2020-11-04 DIAGNOSIS — G319 Degenerative disease of nervous system, unspecified: Secondary | ICD-10-CM | POA: Diagnosis not present

## 2020-11-04 DIAGNOSIS — G9389 Other specified disorders of brain: Secondary | ICD-10-CM | POA: Diagnosis not present

## 2020-11-04 MED ORDER — GADOBENATE DIMEGLUMINE 529 MG/ML IV SOLN
20.0000 mL | Freq: Once | INTRAVENOUS | Status: AC | PRN
  Administered 2020-11-04: 20 mL via INTRAVENOUS

## 2020-11-04 MED ORDER — HEPARIN SOD (PORK) LOCK FLUSH 100 UNIT/ML IV SOLN
500.0000 [IU] | Freq: Once | INTRAVENOUS | Status: AC
  Administered 2020-11-04: 500 [IU] via INTRAVENOUS

## 2020-11-04 MED ORDER — SODIUM CHLORIDE 0.9% FLUSH
10.0000 mL | INTRAVENOUS | Status: DC | PRN
  Administered 2020-11-04: 10 mL via INTRAVENOUS

## 2020-11-04 MED ORDER — LOSARTAN POTASSIUM-HCTZ 100-12.5 MG PO TABS
1.0000 | ORAL_TABLET | Freq: Every day | ORAL | 1 refills | Status: DC
  Filled 2020-11-04: qty 30, 30d supply, fill #0
  Filled 2020-12-08: qty 30, 30d supply, fill #1

## 2020-11-04 MED ORDER — OXYCODONE HCL ER 10 MG PO T12A
EXTENDED_RELEASE_TABLET | Freq: Two times a day (BID) | ORAL | 0 refills | Status: DC
  Filled 2020-11-04: qty 120, 30d supply, fill #0

## 2020-11-04 MED FILL — Testosterone TD Gel 20.25 MG/ACT (1.62%): TRANSDERMAL | 30 days supply | Qty: 75 | Fill #1 | Status: AC

## 2020-11-04 NOTE — Telephone Encounter (Signed)
Refill

## 2020-11-07 ENCOUNTER — Inpatient Hospital Stay: Payer: PPO

## 2020-11-07 ENCOUNTER — Other Ambulatory Visit (HOSPITAL_COMMUNITY): Payer: Self-pay

## 2020-11-10 ENCOUNTER — Other Ambulatory Visit: Payer: Self-pay

## 2020-11-10 ENCOUNTER — Inpatient Hospital Stay: Payer: PPO | Admitting: Internal Medicine

## 2020-11-10 VITALS — BP 129/61 | HR 89 | Temp 98.9°F | Resp 18 | Ht 70.0 in | Wt 236.6 lb

## 2020-11-10 DIAGNOSIS — R569 Unspecified convulsions: Secondary | ICD-10-CM | POA: Diagnosis not present

## 2020-11-10 DIAGNOSIS — C642 Malignant neoplasm of left kidney, except renal pelvis: Secondary | ICD-10-CM | POA: Diagnosis not present

## 2020-11-10 DIAGNOSIS — C7931 Secondary malignant neoplasm of brain: Secondary | ICD-10-CM | POA: Diagnosis not present

## 2020-11-10 NOTE — Progress Notes (Signed)
[place] [facility] [place], [place] [address] [phone]   Interval Evaluation  Date of Service: 11/10/20 Patient Name: [name] Patient MRN: [id number] Patient DOB: 1945-12-30 Provider: [name], MD  Identifying Statement:  Ph[name] is a 75 y.o. male with Brain metastases [facility]) [C79.31]   Primary Cancer: Renal Cell Carcinoma  CNS Oncologic History: 03/19/19-03/25/19 SRS Treatment: PTV1 Lt Temporal 36m 27 Gy in 3 fractions PTV2 Lt Occipital 173m 27 Gy in 3 fractions PTV6 Lt Frontal 2022m27 Gy in 3 fractions   03/19/19 SRS Treatment:  PTV3 Rt Occipital 55m18mTV4 Lt Frontal 12mm32mTV5 Rt Frontal 4mm  13m7 Lt Parietal 9mm  A55mof these were treated to 20 Gy in 1 fraction   12/02/2015 to 12/12/2015 SBRT Treatment:  The L4 Right spinal was treated to 50 Gy in 5 fractions at 10 Gy per fraction  Interval History:  [name] today for follow up after recent brain MRI.  Denies any new or progressive neurologic deficits.  Denies headaches and seizures. Remains active at home and at church. Takes cabometyx for metastatic renal cell carcinoma through Dr. [name]: Current Outpatient Medications on File Prior to Visit  Medication Sig Dispense Refill   acetaminophen (TYLENOL) 650 MG CR tablet Take 650 mg by mouth every 8 (eight) hours as needed for pain.     cabozantinib (CABOMETYX) 40 MG tablet TAKE 1 TABLET (40 MG TOTAL) BY MOUTH DAILY. TAKE 1 HOUR BEFORE OR 2 HOURS AFTER MEALS. 30 tablet 1   COVID-19 mRNA Vac-TriS, Pfizer, (PFIZER-BIONT COVID-19 VAC-TRIS) SUSP injection Inject into the muscle. 0.3 mL 0   docusate sodium (COLACE) 100 MG capsule Take 100 mg by mouth 2 (two) times daily.     famotidine (PEPCID) 10 MG tablet Take 10 mg by mouth 2 (two) times daily.     hydrocortisone (CORTEF) 10 MG tablet TAKE 2 TABLETS BY MOUTH IN THE MORNING AND 1 TABLET IN THE EVENING 90  tablet 3   ibuprofen (ADVIL,MOTRIN) 200 MG tablet Take 200 mg by mouth every 8 (eight) hours as needed for fever, headache, mild pain, moderate pain or cramping.      levETIRAcetam (KEPPRA) 500 MG tablet TAKE 1 TABLET BY MOUTH 2 TIMES DAILY 60 tablet 2   lidocaine-prilocaine (EMLA) cream APPLY TO PORTACATH 1 HOUR PRIOR TO USE AS NEEDED 30 g 2   LORazepam (ATIVAN) 0.5 MG tablet Take 1-2 tablets (0.5-1 mg total) by mouth every 6 (six) hours as needed for anxiety. 60 tablet 2   losartan-hydrochlorothiazide (HYZAAR) 100-12.5 MG tablet Take 1 tablet by mouth daily.      losartan-hydrochlorothiazide (HYZAAR) 100-12.5 MG tablet TAKE 1 TABLET BY MOUTH ONCE A DAY 30 tablet 1   mirtazapine (REMERON) 30 MG tablet TAKE 1 TABLET BY MOUTH EVERY EVENING AT BEDTIME 90 tablet 5   Multiple Vitamin (MULTIVITAMIN ADULT PO) Take 1 tablet by mouth daily. [place] product     ondansetron (ZOFRAN) 8 MG tablet TAKE 1 TABLET BY MOUTH EVERY 8 HOURS AS NEEDED FOR NAUSEA AND VOMITING 90 tablet 2   oxyCODONE (OXY IR/ROXICODONE) 5 MG immediate release tablet Take 1 tablet (5 mg total) by mouth every 6 (six) hours as needed for severe pain. 60 tablet 0   oxyCODONE (OXYCONTIN) 10 mg 12 hr tablet TAKE 2 TABLETS BY MOUTH EVERY 12 HOURS. 120 tablet 0   polyethylene  glycol (MIRALAX / GLYCOLAX) 17 g packet Take 17 g by mouth daily.     predniSONE (DELTASONE) 50 MG tablet Take 1 tablet (50 mg) 13 hours, 7 hours and 1 hour prior to CT scan. Take Benadryl OTC 50 mg 1 hour prior to scan 3 tablet 0   Testosterone 1.62 % GEL APPLY 2 PUMPS TO AREA ONCE A DAY AS DIRECTED 75 g 3   diphenhydrAMINE (BENADRYL) 50 MG tablet Take 1 tablet (50 mg total) by mouth as directed. Take Benadryl 50 mg by mouth 1 hour before CT scan. (Patient not taking: No sig reported) 1 tablet 0   Current Facility-Administered Medications on File Prior to Visit  Medication Dose Route Frequency Provider Last Rate Last Admin   sodium chloride flush (NS) 0.9 % injection 10  mL  10 mL Intravenous PRN [name], MD   10 mL at 06/22/16 1638    Allergies:  Allergies  Allergen Reactions   Contrast Media [Iodinated Diagnostic Agents] Rash   Prednisone & Diphenhydramine Rash   Past Medical History:  Past Medical History:  Diagnosis Date   Anxiety    Brain cancer ([place])    Chronic kidney disease    renal cancer   Constipation    Depression    Hypertension    Low testosterone in male    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   Seizures ([place])    last seizure 01/2019 - controlled since on keppra   Wears glasses    Past Surgical History:  Past Surgical History:  Procedure Laterality Date   insertion port-a-cath  2018   IR GENERIC HISTORICAL  11/18/2015   IR FLUORO GUIDED NEEDLE PLC ASPIRATION/INJECTION LOC 11/18/2015 [name], MD [facility]-INTERV RAD   IR GENERIC HISTORICAL  05/16/2016   IR FLUORO GUIDE PORT INSERTION RIGHT 05/16/2016 [name], MD [facility]-INTERV RAD   IR GENERIC HISTORICAL  05/16/2016   IR US GUIDE VASC ACCESS RIGHT 05/16/2016 [name], MD [facility]-INTERV RAD   RADIOLOGY WITH ANESTHESIA N/A 06/16/2019   Procedure: MRI BRAIN WITH AND WITHOUT CONTRAST WITH ANESTHESIA;  Surgeon: Radiologist, Medication, MD;  Location: [name];  Service: Radiology;  Laterality: N/A;   Social History:  Social History   Socioeconomic History   Marital status: Single    Spouse name: Not on file   Number of children: Not on file   Years of education: Not on file   Highest education level: Not on file  Occupational History   Not on file  Tobacco Use   Smoking status: Never   Smokeless tobacco: Never  Vaping Use   Vaping Use: Never used  Substance and Sexual Activity   Alcohol use: Not Currently    Alcohol/week: 1.0 standard drink    Types: 1 Glasses of wine per week   Drug use: Yes    Types: Marijuana    Comment: Last use 06/13/19   Sexual activity: Not Currently  Other Topics Concern   Not on file  Social History Narrative   Not on  file   Social Determinants of Health   Financial Resource Strain: Not on file  Food Insecurity: Not on file  Transportation Needs: Not on file  Physical Activity: Not on file  Stress: Not on file  Social Connections: Not on file  Intimate Partner Violence: Not on file   Family History:  Family History  Problem Relation Age of Onset   Cancer Grandchild        lymphoma  Review of Systems: Constitutional: Doesn't report fevers, chills or abnormal weight loss Eyes: Doesn't report blurriness of vision Ears, nose, mouth, throat, and face: Doesn't report sore throat Respiratory: Doesn't report cough, dyspnea or wheezes Cardiovascular: Doesn't report palpitation, chest discomfort  Gastrointestinal:  Doesn't report nausea, constipation, diarrhea GU: Doesn't report incontinence Skin: Doesn't report skin rashes Neurological: Per HPI Musculoskeletal: Doesn't report joint pain Behavioral/Psych: Doesn't report anxiety  Physical Exam: Vitals:   11/10/20 1239  BP: 129/61  Pulse: 89  Resp: 18  Temp: 98.9 F (37.2 C)  SpO2: 99%   KPS: 90. General: Alert, cooperative, pleasant, in no acute distress Head: Normal EENT: No conjunctival injection or scleral icterus.  Lungs: Resp effort normal Cardiac: Regular rate Abdomen: Non-distended abdomen Skin: No rashes cyanosis or petechiae. Extremities: No clubbing or edema  Neurologic Exam: Mental Status: Awake, alert, attentive to examiner. Oriented to self and environment. Language is fluent with intact comprehension.  Cranial Nerves: Visual acuity is grossly normal. Visual fields are full. Extra-ocular movements intact. No ptosis. Face is symmetric Motor: Tone and bulk are normal. Power is full in both arms and legs. Reflexes are symmetric, no pathologic reflexes present.  Sensory: Intact to light touch Gait: Normal.   Labs: I have reviewed the data as listed    Component Value Date/Time   NA 134 (L) 11/01/2020 1009   NA 140  02/21/2017 1128   K 4.4 11/01/2020 1009   K 4.3 02/21/2017 1128   CL 97 (L) 11/01/2020 1009   CO2 27 11/01/2020 1009   CO2 26 02/21/2017 1128   GLUCOSE 178 (H) 11/01/2020 1009   GLUCOSE 115 02/21/2017 1128   BUN 15 11/01/2020 1009   BUN 16.6 02/21/2017 1128   CREATININE 0.74 11/01/2020 1009   CREATININE 0.8 02/21/2017 1128   CALCIUM 9.0 11/01/2020 1009   CALCIUM 9.4 02/21/2017 1128   PROT 6.8 11/01/2020 1009   PROT 7.5 02/21/2017 1128   ALBUMIN 3.9 11/01/2020 1009   ALBUMIN 4.2 02/21/2017 1128   AST 16 11/01/2020 1009   AST 20 02/21/2017 1128   ALT 25 11/01/2020 1009   ALT 43 02/21/2017 1128   ALKPHOS 79 11/01/2020 1009   ALKPHOS 104 02/21/2017 1128   BILITOT 0.3 11/01/2020 1009   BILITOT 0.36 02/21/2017 1128   GFRNONAA >60 11/01/2020 1009   GFRAA >60 11/05/2019 0957   Lab Results  Component Value Date   WBC 6.1 11/01/2020   NEUTROABS 4.6 11/01/2020   HGB 12.9 (L) 11/01/2020   HCT 39.7 11/01/2020   MCV 88.6 11/01/2020   PLT 272 11/01/2020    Imaging:  MR BRAIN W WO CONTRAST  Result Date: 11/06/2020 CLINICAL DATA:  Brain metastases. Brain/CNS neoplasm, assess treatment response. Additional history obtained from prior radiology records: History of renal cell carcinoma. EXAM: MRI HEAD WITHOUT AND WITH CONTRAST TECHNIQUE: Multiplanar, multiecho pulse sequences of the brain and surrounding structures were obtained without and with intravenous contrast. CONTRAST:  33m MULTIHANCE GADOBENATE DIMEGLUMINE 529 MG/ML IV SOLN COMPARISON:  Prior brain MRI examinations 08/10/2020 and earlier. FINDINGS: Brain: Mild generalized cerebral and cerebellar atrophy. Multiple supratentorial and infratentorial parenchymal metastases are stable as compared to the brain MRI of 08/10/2020, as follows (annotated on the axial postcontrast T1-weighted sequence, series 11). 9 mm lesion within the high left frontal lobe, unchanged in size (series 11, image 144) (remeasured on prior). Precontrast T1  hyperintensity limits evaluation for enhancement. 18 mm enhancing lesion within the medial left frontal lobe, unchanged in size (series 11, image  123) (remeasured on prior). 4 mm enhancing lesion within the left parietal lobe, unchanged in size (series 11, image 100) (remeasured on prior). Punctate enhancing lesion within the posterior right frontal lobe, in retrospect present on the prior examination and unchanged in size (series 11, image 123). Punctate enhancing lesion within the right occipital lobe, unchanged (series 11, image 67). 9 mm ill-defined enhancing lesion within the right occipital lobe, unchanged in size (series 11, image 59) (remeasured on prior). 9 mm enhancing lesion within the lateral left occipital lobe, unchanged in size (series 11, image 44) (remeasured on prior). Punctate enhancing lesion within the medial left temporal, in retrospect present on the prior examination and unchanged in size (series 11, image 59). Unchanged ill-defined enhancement and 3 mm precontrast T1 hyperintense nodular focus within the inferior left temporal lobe with adjacent encephalomalacia/gliosis and possible edema (series 11, images 39-51). Punctate enhancing lesion within the left cerebellar hemisphere, unchanged in size (series 11, image 20). Punctate enhancing lesion versus vascular enhancement within the right cerebellar hemisphere, unchanged (series 11, image 38). Edema surrounding several lesions, stable and again most prominent surrounding the left frontal lobe lesions (mild-to-moderate in severity at these sites). As before, some lesions have associated chronic and subacute blood products. Redemonstrated chronic small-vessel infarct with associated chronic hemosiderin deposition in the left corona radiata/internal capsule. Background mild multifocal T2/FLAIR hyperintensity within the cerebral white matter, nonspecific but compatible with chronic small vessel ischemic disease. There is no acute infarct. No  extra-axial fluid collection. No midline shift. Vascular: Maintained flow voids within the proximal large arterial vessels. Skull and upper cervical spine: No focal suspicious marrow lesion. Sinuses/Orbits: Visualized orbits show no acute finding. Trace scattered paranasal sinus mucosal thickening. IMPRESSION: Multiple supratentorial and infratentorial parenchymal metastases are stable as compared to the brain MRI of 08/10/2020, as detailed. There are punctate enhancing metastases within the medial left temporal lobe and posterior right frontal lobe which, in retrospect, were present on the prior brain MRI of 08/10/2020 and have not changed in size since that time. Edema associated with several lesions, as described and unchanged. Electronically Signed   By: Kellie Simmering D.O.   On: 11/06/2020 19:21   CT CHEST ABDOMEN PELVIS W CONTRAST  Result Date: 11/02/2020 CLINICAL DATA:  Metastatic renal cell cancer.  Restaging. EXAM: CT CHEST, ABDOMEN, AND PELVIS WITH CONTRAST TECHNIQUE: Multidetector CT imaging of the chest, abdomen and pelvis was performed following the standard protocol during bolus administration of intravenous contrast. CONTRAST:  12m OMNIPAQUE IOHEXOL 350 MG/ML SOLN COMPARISON:  08/11/2020.  02/08/2020 FINDINGS: CT CHEST FINDINGS Cardiovascular: The heart size is normal. No substantial pericardial effusion. Coronary artery calcification is evident. Mild atherosclerotic calcification is noted in the wall of the thoracic aorta. Right Port-A-Cath tip is positioned at the SVC/RA junction. Mediastinum/Nodes: No mediastinal lymphadenopathy. Soft tissue fullness in the left hilar region is stable. No right hilar lymphadenopathy. The esophagus has normal imaging features. There is no axillary lymphadenopathy. Lungs/Pleura: Centrilobular emphsyema noted. Interval increase in left suprahilar upper lobe volume loss with consolidative opacity (45/4). No other suspicious nodule or mass. No pleural effusion.  Musculoskeletal: Similar appearance of metastatic disease involving thoracic spine and ribs. CT ABDOMEN PELVIS FINDINGS Hepatobiliary: 1.7 x 1.1 cm right hepatic lobe lesion measured previously is 2.4 x 1.6 cm today on image 55/series 2. 9 mm low-density lesion anterior left liver (segment II) on 54/2 was approximately 4 mm previously. 9 mm lesion seen in the medial segment left liver adjacent to the falciform ligament on  54/2 appears to be new in the interval. 6 mm medial segment left liver lesion adjacent to the gallbladder fossa on 57/2 is also new. 15 mm low-density lesion in the central left liver on 53/2 is stable and may be a cyst. Gallstones are identified in the gallbladder. No intrahepatic or extrahepatic biliary dilation. Pancreas: No focal mass lesion. No dilatation of the main duct. No intraparenchymal cyst. No peripancreatic edema. Spleen: No splenomegaly. No focal mass lesion. Adrenals/Urinary Tract: Stable nodular thickening of both adrenal glands with dominant nodule in the left adrenal gland measuring 2 cm today compared to 2 cm previously (remeasured). Tiny hypodensity in the interpolar right kidney is stable. 3.7 x 3.7 cm upper pole left renal mass measured previously is 4.0 x 3.8 cm today, minimally progressed. No filling defect in the left renal vein. No evidence for hydroureter. Bladder is decompressed. Subtle hyperdense/enhancing nodule is seen in the left bladder measuring about 7 mm on axial image 113/2, and visible on coronal 66/5 and sagittal 87/6. Stomach/Bowel: Stomach is unremarkable. No gastric wall thickening. No evidence of outlet obstruction. Duodenum is normally positioned as is the ligament of Treitz. No small bowel wall thickening. No small bowel dilatation. The terminal ileum is normal. The appendix is normal. No gross colonic mass. No colonic wall thickening. Diverticular changes are noted in the left colon without evidence of diverticulitis. Vascular/Lymphatic: There is  moderate atherosclerotic calcification of the abdominal aorta without aneurysm. There is no gastrohepatic ligament lymphadenopathy. 14 mm short axis hepatoduodenal ligament lymph node on 64/2 is stable. No retroperitoneal or mesenteric lymphadenopathy. No pelvic sidewall lymphadenopathy. Reproductive: Prostate gland is enlarged. Other: No intraperitoneal free fluid. Musculoskeletal: Osseous metastases in the bony pelvis, sacrum, and lumbar spine are similar to prior. IMPRESSION: 1. Abnormal soft tissue in the superior left hilum with interval increase in left suprahilar upper lobe volume loss with consolidative opacity. Metastatic disease with central left upper lobe obstruction a concern. 2. Continued further increase in size of the left upper pole renal mass concerning for renal cell carcinoma. 3. Progression of hepatic metastases with enlargement of previously existing lesions and new lesions evident on today's study. 4. Similar appearance of bony metastases. 5. Similar appearance subtle hyperdense/enhancing nodule in the left bladder. Urothelial neoplasm could have this appearance. 6. Cholelithiasis. 7. Aortic Atherosclerosis (ICD10-I70.0) and Emphysema (ICD10-J43.9). Electronically Signed   By: Misty Stanley M.D.   On: 11/02/2020 09:01     [place] Clinician Interpretation: I have personally reviewed the radiological images as listed.  My interpretation, in the context of the patient's clinical presentation, is stable disease   Assessment/Plan Brain metastases [facility]) [C79.31]  [name] is clinically stable today.  MRI demonstrates stability of previously identified right parietal lesion, and otherwise stable with regards to previously treated metastases.  Otherwise continues to demonstrate good cognitive and motor function.  He should continue Keppra 574m BID for seizure prevention.  We [place] 4 months following next brain MRI, or sooner as  needed.  We appreciate the opportunity to participate in the care of JOdessa   All questions were answered. The patient knows to call the clinic with any problems, questions or concerns. No barriers to learning were detected.  I have spent a total of 30 minutes of face-to-face and non-face-to-face time, excluding clinical staff time, preparing to see patient, ordering tests and/or medications, counseling the patient, and independently interpreting results and communicating results to the patient/family/caregiver  Phillip Sellers, MD Medical Director of Neuro-Oncology [facility] [place] 11/10/20 12:48 PM

## 2020-11-11 ENCOUNTER — Inpatient Hospital Stay: Payer: PPO | Admitting: Oncology

## 2020-11-11 ENCOUNTER — Inpatient Hospital Stay: Admission: RE | Admit: 2020-11-11 | Payer: PPO | Source: Ambulatory Visit

## 2020-11-11 ENCOUNTER — Telehealth: Payer: Self-pay | Admitting: Pharmacist

## 2020-11-11 ENCOUNTER — Telehealth: Payer: Self-pay | Admitting: Pharmacy Technician

## 2020-11-11 ENCOUNTER — Telehealth: Payer: Self-pay | Admitting: Internal Medicine

## 2020-11-11 ENCOUNTER — Other Ambulatory Visit (HOSPITAL_BASED_OUTPATIENT_CLINIC_OR_DEPARTMENT_OTHER): Payer: Self-pay

## 2020-11-11 ENCOUNTER — Other Ambulatory Visit (HOSPITAL_COMMUNITY): Payer: Self-pay

## 2020-11-11 VITALS — BP 123/60 | HR 54 | Temp 97.8°F | Resp 16 | Wt 234.6 lb

## 2020-11-11 DIAGNOSIS — C649 Malignant neoplasm of unspecified kidney, except renal pelvis: Secondary | ICD-10-CM

## 2020-11-11 DIAGNOSIS — C642 Malignant neoplasm of left kidney, except renal pelvis: Secondary | ICD-10-CM | POA: Diagnosis not present

## 2020-11-11 MED ORDER — LENVATINIB (18 MG DAILY DOSE) 10 MG & 2 X 4 MG PO CPPK
18.0000 mg | ORAL_CAPSULE | Freq: Every day | ORAL | 0 refills | Status: DC
  Filled 2020-11-11: qty 90, 90d supply, fill #0
  Filled 2020-11-28: qty 90, 30d supply, fill #0

## 2020-11-11 MED FILL — Ondansetron HCl Tab 8 MG: ORAL | 30 days supply | Qty: 90 | Fill #0 | Status: AC

## 2020-11-11 NOTE — Progress Notes (Signed)
**Phillip [name]** Phillip Phillip [name]   Diagnosis: Renal cell carcinoma  INTERVAL HISTORY:   Phillip Phillip [name] returns as scheduled.  No new complaint.  He saw Dr. Mickeal Skinner yesterday.  The brain MRI on 11/04/2020 revealed no change in the brain lesions.  He will be scheduled for a follow-up MRI in 4 months.  Objective:  Vital signs in last 24 hours:  Blood pressure 123/60, pulse (!) 54, temperature 97.8 F (36.6 C), temperature source Temporal, resp. rate 16, weight 234 lb 9.6 oz (106.4 kg), SpO2 97 %.   Physical examination-not performed today Lab Results:  Lab Results  Component Value Date   WBC 6.1 11/01/2020   HGB 12.9 (L) 11/01/2020   HCT 39.7 11/01/2020   MCV 88.6 11/01/2020   PLT 272 11/01/2020   NEUTROABS 4.6 11/01/2020    CMP  Lab Results  Component Value Date   NA 134 (L) 11/01/2020   K 4.4 11/01/2020   CL 97 (L) 11/01/2020   CO2 27 11/01/2020   GLUCOSE 178 (H) 11/01/2020   BUN 15 11/01/2020   CREATININE 0.74 11/01/2020   CALCIUM 9.0 11/01/2020   PROT 6.8 11/01/2020   ALBUMIN 3.9 11/01/2020   AST 16 11/01/2020   ALT 25 11/01/2020   ALKPHOS 79 11/01/2020   BILITOT 0.3 11/01/2020   GFRNONAA >60 11/01/2020   GFRAA >60 11/05/2019    Lab Results  Component Value Date   CEA1 1.77 01/04/2016     Medications: I have reviewed the patient's current medications.   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, L4 nerve compression Biopsy of the L4 mass 11/18/2015 confirmed metastatic renal cell carcinoma, clear cell type CTs of the chest, abdomen, and pelvis 11/18/2015-right lower lobe nodule, expansile lytic lesion at the right 11th rib/costal vertebral junction, left renal mass, expansile lesion involving the L4 vertebra, lytic lesion at the left acetabulum, and a low-attenuation liver lesion Initiation of SRS to L4 12/02/2015, Completed 12/12/2015 Initiation of Pazopanib 12/30/2015 Pazopanib placed on hold  02/06/2016 secondary to elevated liver enzymes Pazopanib resumed 03/07/2016 at a dose of 400 mg daily  Pazopanib discontinued 03/19/2016 secondary to elevated liver enzymes Restaging CTs 04/02/2016-stable left renal mass, decreased soft tissue component associated with the L4 metastasis, increased soft tissue component associated with the right 11th rib metastasis with increased T11 bony destruction, increased sclerosis at the left acetabulum lesion Cycle 1 nivolumab 04/12/2016 Cycle 2 nivolumab 04/26/2016 Cycle 3 nivolumab 05/11/2016 Cycle 4 nivolumab 05/24/2016 Cycle 5 nivolumab 06/08/2016 MRI lumbar spine 06/21/2016-unchanged tumor at L3, increased size of retroperitoneal lymph nodes compared to a CT from 04/02/2016 Cycle 6 nivolumab  06/22/2016 CTs chest, abdomen, and pelvis 07/04/2016-enlargement of the left renal mass, right adrenal nodule, left hilar and peritoneal lymph nodes, enlargement of left acetabular lesion. Stable lung nodules. Cycle 7 nivolumab 07/06/2016 Cycle 8 nivolumab 07/20/2016 Cycle 9 nivolumab 08/02/2016 Cycle 10 nivolumab 08/20/2016 Restaging CT 09/03/2016 evaluation with stable disease Cycle 11 nivolumab 09/05/2016 Cycle 12 nivolumab 09/19/2016 Cycle 13 nivolumab 10/03/2016 Cycle 14 nivolumab 10/17/2016 Cycle 15 nivolumab 10/31/2016 Cycle 16 nivolumab 11/14/2016 (changed to monthly schedule) Cycle 17 nivolumab 12/19/2016 CTs 01/21/2017-increased left renal mass, increased size of adrenal metastases, increased lytic bone lesions, increased left lung nodule, persistent tumor at L4 with probable epidural component Initiation of Cabozantinib 01/28/2017 Restaging CTs 05/30/2017- decreased size of left hilar mass, left renal mass, retroperitoneal adenopathy, and adrenal metastasis.  Healing bone lesions. Cabozantinib continued CTs 10/21/2017- interval enlargement left hilar lymph node; stable  rib lesions; stable mass left renal cortex; stable mildly nodular adrenal  glands; stable lytic lesions within the pelvis and spine. Cabozantinib continued CTs 02/21/2018- enlargement of an AP window lymph node.  Mildly enlarged left hilar lymph node is unchanged.  Primary renal cell carcinoma involving the upper pole of the left kidney appears similar.  Stable enlarged right periaortic lymph node adjacent to the renal vessels.  Stable multifocal bony metastatic disease. Cabozantinib continued CTs 07/29/2018- stable 15 mm AP window nodes; stable left hilar node; subcarinal node slightly larger; right periaortic node 16 mm, previously 12 mm; portacaval node 24 mm, previously 16 mm; 15 mm node superior to the pancreatic head has enlarged; interval increase nodularity of both adrenal glands; stable left kidney mass; multifocal bony metastatic disease not significantly changed. Cabozantinib continued CTs 11/06/2018- moderate improvement in thoracic adenopathy; minimal improvement abdominal adenopathy; left kidney upper pole mass and various lytic expansile bone lesions stable; mild increase in nodularity of left adrenal gland; right adrenal gland nodularity stable. 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  Peripheral enhancing masses consistent with metastases. No hemorrhage. MRI brain 03/11/2019-7 enhancing brain masses consistent with metastatic disease SRS to 7 brain lesions, treatment given 03/19/2019, 03/23/2019, and 03/25/2019 CTs 04/09/2019-decrease in left renal mass, bilateral adrenal nodules, AP window and porta hepatic adenopathy.  New 9 mm right lower lobe nodule.  Improved lytic lesion of the left acetabulum.  Other bone lesions are stable. Cabozantinib continued MRI brain 07/17/2019-resolution of 4 mm treated lesion in the right frontal cortex, 6 remaining treated lesions have decreased in size, new punctate metastasis in the superior right cerebellum SRS to  right cerebellar lesion 07/30/2019 CTs 08/17/2019-previously noted right lower lobe nodule resolved, stable left kidney mass, mixed lytic/sclerotic bone lesions in the thoracolumbar spine, right posterior ribs, and left acetabulum-unchanged, no evidence of progressive disease  Cabozantinib continued MRI brain 10/23/2019-stable to slight decrease in size of multiple enhancing intracranial lesions.  Slight increase in surrounding edema in the medial left frontal lobe.  No new lesions present. MRI brain 01/29/2020-multiple enhancing brain lesions, some hemorrhagic, some with mild enlargement-treatment effect? CTs 02/08/2020-no thoracic metastases, enlargement of the left renal mass, enlargement of lytic lesion at T9, other lytic lesions unchanged MRI brain 05/04/2020-no new lesions, slight enlargement of a right occipital lesion, other lesions are stable or decreased in size CTs 08/11/2020-enlargement of left kidney mass, new 1.6 cm segment 7 liver lesion, enlargement of a lytic lesion at T9, increased lysis of a sclerotic lesion at the right second rib, 0.7 cm endoluminal nodule at the bladder dome-enlarged Brain MRI 08/10/2020-slight increase in a 15 mm left frontal lobe lesion, new punctate focus in the right occipital cortex, other lesions stable or slightly decreased CTs 11/01/2020-increase in left suprahilar opacity, enlargement of previous liver metastases, several new subcentimeter lesions, increase in left renal mass, similar appearance of bone metastases, stable hyperdense/enhancing nodule in the left bladder Brain MRI 11/04/2020-no change in brain metastases Pain secondary to #1-managed by Dr. Lovenia Shuck.  Improved Hypertension Elevated transaminases 02/06/2016- Pazopanib placed on hold Liver enzymes normal 03/07/2016 Port-A-Cath placement 05/16/2016 Malaise/anorexia 09/05/2016. Cortisol and testosterone levels low. Hydrocortisone and testosterone replacement initiated. Conjunctival/scleral erythema  09/19/2016-resolved with steroid eyedrops Proximal right leg weakness. 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 Hypercalcemia status post Zometa 01/23/2017-resolved Pruritic rash following IV contrast 10/21/2017; rash following IV contrast 02/21/2018 despite prednisone/Benadryl premedication Brief episodes of expressive  aphasia October and December 2020          Disposition: Phillip Phillip [name] appears unchanged.  We discussed treatment options again today.  There has been evidence of disease progression on subsequent CTs.  We decided to make a change in treatment.  I recommend lenvatinib plus pembrolizumab.  We reviewed potential toxicities associated with lenvatinib including the chance of a rash, diarrhea, hypertension, thromboembolic disease, and renal toxicity.  He will receive additional teaching from the Cancer center pharmacy.  We reviewed toxicities associated with pembrolizumab including the chance of a rash and various autoimmune toxicities.  He agrees to proceed.  He will discontinue cabozantinib after the dose on 11/25/2020.  He will return for an office visit and cycle 1 lenvatinib/pembrolizumab on 11/29/2020.  A treatment plan was entered today.  Betsy Coder, MD  11/11/2020  2:02 PM

## 2020-11-11 NOTE — Telephone Encounter (Signed)
Oral Oncology Pharmacist Encounter  Received new prescription for Lenvima (lenvatinib) for the treatment of metastatic RCC in conjunction with pembrolizumab, planned duration until disease progression or unacceptable drug toxicity. He will be transitioning from treatment with cabozantinib. Planned start 11/30/20.  CMP from 11/01/20 assessed, no relevant lab abnormalities. TSH and UA lab order entered, recommended to recheck periodically. BP at recent office visits well controlled, continue to monitor. Prescription dose and frequency assessed.   Current medication list in Epic reviewed, no relevant DDIs with lenvatinib identified.  Evaluated chart and no patient barriers to medication adherence identified.   Prescription has been e-scribed to the [facility] for benefits analysis and approval.  Oral Oncology Clinic will continue to follow for insurance authorization, copayment issues, initial counseling and start date.  Patient agreed to treatment on 11/11/20 per MD documentation.  Darl Pikes, PharmD, BCPS, BCOP, CPP Hematology/Oncology Clinical Pharmacist Practitioner [facility] [phone]  11/11/2020 3:04 PM

## 2020-11-11 NOTE — Telephone Encounter (Signed)
Oral Oncology Patient Advocate Encounter   Received notification from [employer] that prior authorization for Phillip [name] is required.   PA submitted on CoverMyMeds Key [id number] Status is pending   Oral Oncology Clinic will continue to follow.  Phillip [name] Patient [place] Phone [phone] Fax [phone] 11/11/2020 3:41 PM

## 2020-11-11 NOTE — Telephone Encounter (Signed)
Scheduled appt per 9/15 los. Called pt, no answer. Left msg with appt date and time.

## 2020-11-14 ENCOUNTER — Ambulatory Visit: Payer: PPO | Admitting: Internal Medicine

## 2020-11-14 ENCOUNTER — Encounter: Payer: Self-pay | Admitting: Oncology

## 2020-11-14 ENCOUNTER — Other Ambulatory Visit (HOSPITAL_COMMUNITY): Payer: Self-pay

## 2020-11-14 NOTE — Telephone Encounter (Signed)
Oral Oncology Patient Advocate Encounter  Prior Authorization for Michel Santee has been approved.    PA# [id number] Effective dates: 11/11/20 through 11/11/21  Patients co-pay is $976.34.  Patient has grant through [employer] that will cover his copay until 02/25/21.  Oral Oncology Clinic will continue to follow.   [place] Patient [place] Phone [phone] Fax [phone] 11/14/2020 8:42 AM

## 2020-11-25 DIAGNOSIS — I10 Essential (primary) hypertension: Secondary | ICD-10-CM | POA: Diagnosis not present

## 2020-11-25 DIAGNOSIS — C649 Malignant neoplasm of unspecified kidney, except renal pelvis: Secondary | ICD-10-CM | POA: Diagnosis not present

## 2020-11-25 DIAGNOSIS — E785 Hyperlipidemia, unspecified: Secondary | ICD-10-CM | POA: Diagnosis not present

## 2020-11-27 ENCOUNTER — Other Ambulatory Visit: Payer: Self-pay | Admitting: Oncology

## 2020-11-28 ENCOUNTER — Telehealth: Payer: Self-pay | Admitting: Pharmacy Technician

## 2020-11-28 ENCOUNTER — Telehealth: Payer: Self-pay | Admitting: Pharmacist

## 2020-11-28 ENCOUNTER — Encounter: Payer: Self-pay | Admitting: Oncology

## 2020-11-28 ENCOUNTER — Other Ambulatory Visit (HOSPITAL_COMMUNITY): Payer: Self-pay

## 2020-11-28 NOTE — Telephone Encounter (Signed)
Oral Oncology Patient Advocate Encounter  I spoke with Mr Wolfinger this morning to set up delivery of Lenvima.  Address verified for shipment.  Michel Santee will be filled through [facility] and mailed 11/29/20 for delivery 11/30/20.    [place] will call 7-10 days before next refill is due to complete adherence call and set up delivery of medication.     [place] Patient [place] Phone [phone] Fax [phone] 11/28/2020 3:05 PM

## 2020-11-28 NOTE — Telephone Encounter (Signed)
Oral Oncology Patient Advocate Encounter  Was successful in securing patient a $10,000 grant from [employer] to provide copayment coverage for [place].  This will keep the out of pocket expense at $0.     [employer] ID: [id number]  I have spoken with the patient.   The billing information is as follows and has been shared with [place].    RxBin: [id number] PCN: [id number] Member ID: [id number] Group ID: [id number] Dates of Eligibility: 11/11/20 through 11/10/21  Fund:  Renal Cell Phillip [name] Phone [phone] Fax [phone] 11/28/2020 12:09 PM

## 2020-11-28 NOTE — Telephone Encounter (Signed)
Oral Chemotherapy Pharmacist Encounter  [place] will deliver medication on 11/30/20.  Patient Education I spoke with patient and his spouse [name] for overview of new chemotherapy medications: Lenvima (lenvatinib) and Keytruda (pembrolizumab) for the treatment of metastatic RCC in conjunction with pembrolizumab, planned duration until disease progression or unacceptable drug toxicity. He will be transitioning from treatment with cabozantinib. Planned start 11/30/20.  Counseled patient on administration, dosing, side effects, monitoring, drug-food interactions, safe handling, storage, and disposal.  Patient will take/receive: Lenvatinib: Take one 10mg  capsule and two 4mg  capsules (18 mg total) by mouth daily Pembrolizumab: 200mg  given every 21 days   Side Effects: Side effects include but not limited to:    Lenvatinib: Hand-foot syndrome, diarrhea, N/V, fatigue, mouth sores, increased BP (HTN) Pembrolizumab: diarrhea, rash, changes in thyroid function   Diarrhea: discussed with patient that depending on which medication is causing the diarrhea, that determines the best management for the diarrhea. They should call the office if diarrhea occurs, they can start loperamide but still needs to call the office. Hand-foot syndrome: patient will order [place] Smooth Extra Care 20, recommended they keep hands and feet moisturized  HTN: Currently well controlled, reviewed s/sx of HTN Mouth sores: pt instructed to call the office to obtain magic mouthwash if needed N/V: pt reported having antiemetic medication at home to use as needed.   Reviewed with patient importance of keeping a medication schedule and plan for any Lenvima missed doses.  After discussion with patient no patient barriers to medication adherence identified.   [name] and [place] voiced understanding and appreciation. All questions answered. Medication handout provided.  Provided patient with Oral [facility] phone number. Patient knows to call the office with questions or concerns. Oral Chemotherapy Navigation Clinic will continue to follow.  Darl Pikes, PharmD, BCPS, BCOP, CPP Hematology/Oncology Clinical Pharmacist Practitioner [facility] [phone]  11/28/2020 11:48 AM

## 2020-11-29 ENCOUNTER — Other Ambulatory Visit (HOSPITAL_COMMUNITY): Payer: Self-pay

## 2020-11-29 NOTE — Progress Notes (Signed)
Pharmacist Chemotherapy Monitoring - Initial Assessment    Anticipated start date: 11/30/20   The following has been reviewed per standard work regarding the patient's treatment regimen: The patient's diagnosis, treatment plan and drug doses, and organ/hematologic function Lab orders and baseline tests specific to treatment regimen  The treatment plan start date, drug sequencing, and pre-medications Prior authorization status  Patient's documented medication list, including drug-drug interaction screen and prescriptions for anti-emetics and supportive care specific to the treatment regimen The drug concentrations, fluid compatibility, administration routes, and timing of the medications to be used The patient's access for treatment and lifetime cumulative dose history, if applicable  The patient's medication allergies and previous infusion related reactions, if applicable   Changes made to treatment plan:  N/A  Follow up needed:  N/A   Phillip [name], [facility], 11/29/2020  2:07 PM

## 2020-11-30 ENCOUNTER — Inpatient Hospital Stay: Payer: PPO

## 2020-11-30 ENCOUNTER — Other Ambulatory Visit (HOSPITAL_COMMUNITY): Payer: Self-pay

## 2020-11-30 ENCOUNTER — Inpatient Hospital Stay: Payer: PPO | Attending: Oncology

## 2020-11-30 ENCOUNTER — Other Ambulatory Visit: Payer: Self-pay

## 2020-11-30 ENCOUNTER — Encounter: Payer: Self-pay | Admitting: Nurse Practitioner

## 2020-11-30 ENCOUNTER — Inpatient Hospital Stay (HOSPITAL_BASED_OUTPATIENT_CLINIC_OR_DEPARTMENT_OTHER): Payer: PPO | Admitting: Nurse Practitioner

## 2020-11-30 VITALS — BP 146/65 | HR 60 | Temp 98.1°F | Resp 18 | Ht 70.0 in | Wt 239.6 lb

## 2020-11-30 DIAGNOSIS — C7931 Secondary malignant neoplasm of brain: Secondary | ICD-10-CM | POA: Insufficient documentation

## 2020-11-30 DIAGNOSIS — Z5112 Encounter for antineoplastic immunotherapy: Secondary | ICD-10-CM | POA: Diagnosis not present

## 2020-11-30 DIAGNOSIS — C642 Malignant neoplasm of left kidney, except renal pelvis: Secondary | ICD-10-CM | POA: Diagnosis not present

## 2020-11-30 DIAGNOSIS — C649 Malignant neoplasm of unspecified kidney, except renal pelvis: Secondary | ICD-10-CM

## 2020-11-30 DIAGNOSIS — C787 Secondary malignant neoplasm of liver and intrahepatic bile duct: Secondary | ICD-10-CM | POA: Diagnosis not present

## 2020-11-30 DIAGNOSIS — Z79899 Other long term (current) drug therapy: Secondary | ICD-10-CM | POA: Diagnosis not present

## 2020-11-30 LAB — TSH: TSH: 0.698 u[IU]/mL (ref 0.350–4.500)

## 2020-11-30 LAB — CBC WITH DIFFERENTIAL (CANCER CENTER ONLY)
Abs Immature Granulocytes: 0.02 10*3/uL (ref 0.00–0.07)
Basophils Absolute: 0 10*3/uL (ref 0.0–0.1)
Basophils Relative: 1 %
Eosinophils Absolute: 0.4 10*3/uL (ref 0.0–0.5)
Eosinophils Relative: 5 %
HCT: 35 % — ABNORMAL LOW (ref 39.0–52.0)
Hemoglobin: 11.4 g/dL — ABNORMAL LOW (ref 13.0–17.0)
Immature Granulocytes: 0 %
Lymphocytes Relative: 26 %
Lymphs Abs: 2.2 10*3/uL (ref 0.7–4.0)
MCH: 28.9 pg (ref 26.0–34.0)
MCHC: 32.6 g/dL (ref 30.0–36.0)
MCV: 88.6 fL (ref 80.0–100.0)
Monocytes Absolute: 0.7 10*3/uL (ref 0.1–1.0)
Monocytes Relative: 9 %
Neutro Abs: 4.9 10*3/uL (ref 1.7–7.7)
Neutrophils Relative %: 59 %
Platelet Count: 267 10*3/uL (ref 150–400)
RBC: 3.95 MIL/uL — ABNORMAL LOW (ref 4.22–5.81)
RDW: 15.2 % (ref 11.5–15.5)
WBC Count: 8.2 10*3/uL (ref 4.0–10.5)
nRBC: 0 % (ref 0.0–0.2)

## 2020-11-30 LAB — CMP (CANCER CENTER ONLY)
ALT: 22 U/L (ref 0–44)
AST: 14 U/L — ABNORMAL LOW (ref 15–41)
Albumin: 4 g/dL (ref 3.5–5.0)
Alkaline Phosphatase: 72 U/L (ref 38–126)
Anion gap: 8 (ref 5–15)
BUN: 15 mg/dL (ref 8–23)
CO2: 27 mmol/L (ref 22–32)
Calcium: 8.5 mg/dL — ABNORMAL LOW (ref 8.9–10.3)
Chloride: 96 mmol/L — ABNORMAL LOW (ref 98–111)
Creatinine: 0.76 mg/dL (ref 0.61–1.24)
GFR, Estimated: >60 mL/min (ref >60)
Glucose, Bld: 117 mg/dL — ABNORMAL HIGH (ref 70–99)
Potassium: 4.4 mmol/L (ref 3.5–5.1)
Sodium: 131 mmol/L — ABNORMAL LOW (ref 135–145)
Total Bilirubin: 0.4 mg/dL (ref 0.3–1.2)
Total Protein: 6.4 g/dL — ABNORMAL LOW (ref 6.5–8.1)

## 2020-11-30 LAB — TOTAL PROTEIN, URINE DIPSTICK: Protein, ur: NEGATIVE mg/dL

## 2020-11-30 MED ORDER — HEPARIN SOD (PORK) LOCK FLUSH 100 UNIT/ML IV SOLN
500.0000 [IU] | Freq: Once | INTRAVENOUS | Status: AC | PRN
  Administered 2020-11-30: 500 [IU]

## 2020-11-30 MED ORDER — OXYCODONE HCL 5 MG PO TABS
5.0000 mg | ORAL_TABLET | Freq: Four times a day (QID) | ORAL | 0 refills | Status: DC | PRN
  Filled 2020-11-30: qty 60, 15d supply, fill #0

## 2020-11-30 MED ORDER — SODIUM CHLORIDE 0.9 % IV SOLN
200.0000 mg | Freq: Once | INTRAVENOUS | Status: AC
  Administered 2020-11-30: 200 mg via INTRAVENOUS
  Filled 2020-11-30: qty 8

## 2020-11-30 MED ORDER — SODIUM CHLORIDE 0.9% FLUSH
10.0000 mL | INTRAVENOUS | Status: DC | PRN
  Administered 2020-11-30: 10 mL

## 2020-11-30 MED ORDER — SODIUM CHLORIDE 0.9 % IV SOLN
Freq: Once | INTRAVENOUS | Status: AC
Start: 2020-11-30 — End: 2020-11-30

## 2020-11-30 NOTE — Patient Instructions (Signed)
[facility]  Discharge Instructions: Thank you for choosing [facility] to provide your oncology and hematology care.   If you have a lab appointment with the Cancer Center, please go directly to the Cancer Center and check in at the registration area.   Wear comfortable clothing and clothing appropriate for easy access to any Portacath or PICC line.   We strive to give you quality time with your provider. You may need to reschedule your appointment if you arrive late (15 or more minutes).  Arriving late affects you and other patients whose appointments are after yours.  Also, if you miss three or more appointments without notifying the office, you may be dismissed from the clinic at the provider's discretion.      For prescription refill requests, have your pharmacy contact our office and allow 72 hours for refills to be completed.    Today you received the following chemotherapy and/or immunotherapy agents Keytruda      To help prevent nausea and vomiting after your treatment, we encourage you to take your nausea medication as directed.  BELOW ARE SYMPTOMS THAT SHOULD BE REPORTED IMMEDIATELY: *FEVER GREATER THAN 100.4 F (38 C) OR HIGHER *CHILLS OR SWEATING *NAUSEA AND VOMITING THAT IS NOT CONTROLLED WITH YOUR NAUSEA MEDICATION *UNUSUAL SHORTNESS OF BREATH *UNUSUAL BRUISING OR BLEEDING *URINARY PROBLEMS (pain or burning when urinating, or frequent urination) *BOWEL PROBLEMS (unusual diarrhea, constipation, pain near the anus) TENDERNESS IN MOUTH AND THROAT WITH OR WITHOUT PRESENCE OF ULCERS (sore throat, sores in mouth, or a toothache) UNUSUAL RASH, SWELLING OR PAIN  UNUSUAL VAGINAL DISCHARGE OR ITCHING   Items with * indicate a potential emergency and should be followed up as soon as possible or go to the Emergency Department if any problems should occur.  Please show the CHEMOTHERAPY ALERT CARD or IMMUNOTHERAPY ALERT CARD at check-in to the  Emergency Department and triage nurse.  Should you have questions after your visit or need to cancel or reschedule your appointment, please contact [facility]  Dept: [phone]  and follow the prompts.  Office hours are 8:00 a.m. to 4:30 p.m. Monday - Friday. Please note that voicemails left after 4:00 p.m. may not be returned until the following business day.  We are closed weekends and major holidays. You have access to a nurse at all times for urgent questions. Please call the main number to the clinic Dept: [phone] and follow the prompts.   For any non-urgent questions, you may also contact your provider using MyChart. We now offer e-Visits for anyone 18 and older to request care online for non-urgent symptoms. For details visit [email or web address].   Also download the MyChart app! Go to the app store, search "MyChart", open the app, select , and log in with your MyChart username and password.  Due to Covid, a mask is required upon entering the hospital/clinic. If you do not have a mask, one will be given to you upon arrival. For doctor visits, patients may have 1 support person aged 18 or older with them. For treatment visits, patients cannot have anyone with them due to current Covid guidelines and our immunocompromised population.   Pembrolizumab injection What is this medication? PEMBROLIZUMAB (pem broe liz ue mab) is a monoclonal antibody. It is used to treat certain types of cancer. This medicine may be used for other purposes; ask your health care provider or pharmacist if you have questions. COMMON BRAND NAME(S): Keytruda What   should I tell my care team before I take this medication? They need to know if you have any of these conditions: autoimmune diseases like Crohn's disease, ulcerative colitis, or lupus have had or planning to have an allogeneic stem cell transplant (uses someone else's stem cells) history of organ  transplant history of chest radiation nervous system problems like myasthenia gravis or Guillain-Barre syndrome an unusual or allergic reaction to pembrolizumab, other medicines, foods, dyes, or preservatives pregnant or trying to get pregnant breast-feeding How should I use this medication? This medicine is for infusion into a vein. It is given by a health care professional in a hospital or clinic setting. A special MedGuide will be given to you before each treatment. Be sure to read this information carefully each time. Talk to your pediatrician regarding the use of this medicine in children. While this drug may be prescribed for children as young as 6 months for selected conditions, precautions do apply. Overdosage: If you think you have taken too much of this medicine contact a poison control center or emergency room at once. NOTE: This medicine is only for you. Do not share this medicine with others. What if I miss a dose? It is important not to miss your dose. Call your doctor or health care professional if you are unable to keep an appointment. What may interact with this medication? Interactions have not been studied. This list may not describe all possible interactions. Give your health care provider a list of all the medicines, herbs, non-prescription drugs, or dietary supplements you use. Also tell them if you smoke, drink alcohol, or use illegal drugs. Some items may interact with your medicine. What should I watch for while using this medication? Your condition will be monitored carefully while you are receiving this medicine. You may need blood work done while you are taking this medicine. Do not become pregnant while taking this medicine or for 4 months after stopping it. Women should inform their doctor if they wish to become pregnant or think they might be pregnant. There is a potential for serious side effects to an unborn child. Talk to your health care professional or  pharmacist for more information. Do not breast-feed an infant while taking this medicine or for 4 months after the last dose. What side effects may I notice from receiving this medication? Side effects that you should report to your doctor or health care professional as soon as possible: allergic reactions like skin rash, itching or hives, swelling of the face, lips, or tongue bloody or black, tarry breathing problems changes in vision chest pain chills confusion constipation cough diarrhea dizziness or feeling faint or lightheaded fast or irregular heartbeat fever flushing joint pain low blood counts - this medicine may decrease the number of white blood cells, red blood cells and platelets. You may be at increased risk for infections and bleeding. muscle pain muscle weakness pain, tingling, numbness in the hands or feet persistent headache redness, blistering, peeling or loosening of the skin, including inside the mouth signs and symptoms of high blood sugar such as dizziness; dry mouth; dry skin; fruity breath; nausea; stomach pain; increased hunger or thirst; increased urination signs and symptoms of kidney injury like trouble passing urine or change in the amount of urine signs and symptoms of liver injury like dark urine, light-colored stools, loss of appetite, nausea, right upper belly pain, yellowing of the eyes or skin sweating swollen lymph nodes weight loss Side effects that usually do not require medical   attention (report to your doctor or health care professional if they continue or are bothersome): decreased appetite hair loss tiredness This list may not describe all possible side effects. Call your doctor for medical advice about side effects. You may report side effects to FDA at 1-800-FDA-1088. Where should I keep my medication? This drug is given in a hospital or clinic and will not be stored at home. NOTE: This sheet is a summary. It may not cover all possible  information. If you have questions about this medicine, talk to your doctor, pharmacist, or health care provider.  2022 Elsevier/Gold Standard (2019-01-14 21:44:53)  

## 2020-11-30 NOTE — Progress Notes (Signed)
Patient presents for treatment. RN assessment completed along with the following:  Labs/vitals reviewed - Yes, and within treatment parameters.   Weight within 10% of previous measurement - Yes Oncology Treatment Attestation completed for current therapy- Yes, on date 11/11/20 Informed consent completed and reflects current therapy/intent - Yes, on date 11/30/20             Provider progress note reviewed - Yes, today's provider note was reviewed. Treatment/Antibody/Supportive plan reviewed - Yes, and there are no adjustments needed for today's treatment. S&H and other orders reviewed - Yes, and there are no additional orders identified. Previous treatment date reviewed - Yes, and the appropriate amount of time has elapsed between treatments. Clinic Hand Off Received from - [name]   Patient to proceed with treatment.

## 2020-11-30 NOTE — Patient Instructions (Signed)
Implanted Port Home Guide An implanted port is a device that is placed under the skin. It is usually placed in the chest. The device can be used to give IV medicine, to take blood, or for dialysis. You may have an implanted port if: You need IV medicine that would be irritating to the small veins in your hands or arms. You need IV medicines, such as antibiotics, for a long period of time. You need IV nutrition for a long period of time. You need dialysis. When you have a port, your health care provider can choose to use the port instead of veins in your arms for these procedures. You may have fewer limitations when using a port than you would if you used other types of long-term IVs, and you will likely be able to return to normal activities after your incision heals. An implanted port has two main parts: Reservoir. The reservoir is the part where a needle is inserted to give medicines or draw blood. The reservoir is round. After it is placed, it appears as a small, raised area under your skin. Catheter. The catheter is a thin, flexible tube that connects the reservoir to a vein. Medicine that is inserted into the reservoir goes into the catheter and then into the vein. How is my port accessed? To access your port: A numbing cream may be placed on the skin over the port site. Your health care provider will put on a mask and sterile gloves. The skin over your port will be cleaned carefully with a germ-killing soap and allowed to dry. Your health care provider will gently pinch the port and insert a needle into it. Your health care provider will check for a blood return to make sure the port is in the vein and is not clogged. If your port needs to remain accessed to get medicine continuously (constant infusion), your health care provider will place a clear bandage (dressing) over the needle site. The dressing and needle will need to be changed every week, or as told by your health care provider. What  is flushing? Flushing helps keep the port from getting clogged. Follow instructions from your health care provider about how and when to flush the port. Ports are usually flushed with saline solution or a medicine called heparin. The need for flushing will depend on how the port is used: If the port is only used from time to time to give medicines or draw blood, the port may need to be flushed: Before and after medicines have been given. Before and after blood has been drawn. As part of routine maintenance. Flushing may be recommended every 4-6 weeks. If a constant infusion is running, the port may not need to be flushed. Throw away any syringes in a disposal container that is meant for sharp items (sharps container). You can buy a sharps container from a pharmacy, or you can make one by using an empty hard plastic bottle with a cover. How long will my port stay implanted? The port can stay in for as long as your health care provider thinks it is needed. When it is time for the port to come out, a surgery will be done to remove it. The surgery will be similar to the procedure that was done to put the port in. Follow these instructions at home:  Flush your port as told by your health care provider. If you need an infusion over several days, follow instructions from your health care provider about how   to take care of your port site. Make sure you: Wash your hands with soap and water before you change your dressing. If soap and water are not available, use alcohol-based hand sanitizer. Change your dressing as told by your health care provider. Place any used dressings or infusion bags into a plastic bag. Throw that bag in the trash. Keep the dressing that covers the needle clean and dry. Do not get it wet. Do not use scissors or sharp objects near the tube. Keep the tube clamped, unless it is being used. Check your port site every day for signs of infection. Check for: Redness, swelling, or  pain. Fluid or blood. Pus or a bad smell. Protect the skin around the port site. Avoid wearing bra straps that rub or irritate the site. Protect the skin around your port from seat belts. Place a soft pad over your chest if needed. Bathe or shower as told by your health care provider. The site may get wet as long as you are not actively receiving an infusion. Return to your normal activities as told by your health care provider. Ask your health care provider what activities are safe for you. Carry a medical alert card or wear a medical alert bracelet at all times. This will let health care providers know that you have an implanted port in case of an emergency. Get help right away if: You have redness, swelling, or pain at the port site. You have fluid or blood coming from your port site. You have pus or a bad smell coming from the port site. You have a fever. Summary Implanted ports are usually placed in the chest for long-term IV access. Follow instructions from your health care provider about flushing the port and changing bandages (dressings). Take care of the area around your port by avoiding clothing that puts pressure on the area, and by watching for signs of infection. Protect the skin around your port from seat belts. Place a soft pad over your chest if needed. Get help right away if you have a fever or you have redness, swelling, pain, drainage, or a bad smell at the port site. This information is not intended to replace advice given to you by your health care provider. Make sure you discuss any questions you have with your health care provider. Document Revised: 05/04/2020 Document Reviewed: 06/29/2019 Elsevier Patient Education  2022 [employer]  

## 2020-11-30 NOTE — Progress Notes (Signed)
[facility] OFFICE PROGRESS NOTE   Diagnosis: Renal cell carcinoma  INTERVAL HISTORY:   [name] returns as scheduled.  He is scheduled to begin treatment today with lenvatinib/pembrolizumab.  He has periodic nausea.  No diarrhea.  No skin rash.  Pain is stable.  He takes oxycodone as needed.  He describes his appetite as "pretty good".  Objective:  Vital signs in last 24 hours:  Blood pressure (!) 146/65, pulse 60, temperature 98.1 F (36.7 C), temperature source Oral, resp. rate 18, height 5\' 10"  (1.778 m), weight 239 lb 9.6 oz (108.7 kg), SpO2 99 %.    HEENT: No thrush or ulcers. Resp: Lungs clear bilaterally. Cardio: Regular rate and rhythm. GI: Abdomen soft and nontender.  No hepatomegaly. Vascular: No leg edema. Skin: No rash. Port-A-Cath without erythema.   Lab Results:  Lab Results  Component Value Date   WBC 8.2 11/30/2020   HGB 11.4 (L) 11/30/2020   HCT 35.0 (L) 11/30/2020   MCV 88.6 11/30/2020   PLT 267 11/30/2020   NEUTROABS 4.9 11/30/2020    Imaging:  No results found.  Medications: I have reviewed the patient's current medications.  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, L4 nerve compression Biopsy of the L4 mass 11/18/2015 confirmed metastatic renal cell carcinoma, clear cell type CTs of the chest, abdomen, and pelvis 11/18/2015-right lower lobe nodule, expansile lytic lesion at the right 11th rib/costal vertebral junction, left renal mass, expansile lesion involving the L4 vertebra, lytic lesion at the left acetabulum, and a low-attenuation liver lesion Initiation of SRS to L4 12/02/2015, Completed 12/12/2015 Initiation of Pazopanib 12/30/2015 Pazopanib placed on hold 02/06/2016 secondary to elevated liver enzymes Pazopanib resumed 03/07/2016 at a dose of 400 mg daily  Pazopanib discontinued 03/19/2016 secondary to elevated liver enzymes Restaging CTs 04/02/2016-stable left renal  mass, decreased soft tissue component associated with the L4 metastasis, increased soft tissue component associated with the right 11th rib metastasis with increased T11 bony destruction, increased sclerosis at the left acetabulum lesion Cycle 1 nivolumab 04/12/2016 Cycle 2 nivolumab 04/26/2016 Cycle 3 nivolumab 05/11/2016 Cycle 4 nivolumab 05/24/2016 Cycle 5 nivolumab 06/08/2016 MRI lumbar spine 06/21/2016-unchanged tumor at L3, increased size of retroperitoneal lymph nodes compared to a CT from 04/02/2016 Cycle 6 nivolumab  06/22/2016 CTs chest, abdomen, and pelvis 07/04/2016-enlargement of the left renal mass, right adrenal nodule, left hilar and peritoneal lymph nodes, enlargement of left acetabular lesion. Stable lung nodules. Cycle 7 nivolumab 07/06/2016 Cycle 8 nivolumab 07/20/2016 Cycle 9 nivolumab 08/02/2016 Cycle 10 nivolumab 08/20/2016 Restaging CT 09/03/2016 evaluation with stable disease Cycle 11 nivolumab 09/05/2016 Cycle 12 nivolumab 09/19/2016 Cycle 13 nivolumab 10/03/2016 Cycle 14 nivolumab 10/17/2016 Cycle 15 nivolumab 10/31/2016 Cycle 16 nivolumab 11/14/2016 (changed to monthly schedule) Cycle 17 nivolumab 12/19/2016 CTs 01/21/2017-increased left renal mass, increased size of adrenal metastases, increased lytic bone lesions, increased left lung nodule, persistent tumor at L4 with probable epidural component Initiation of Cabozantinib 01/28/2017 Restaging CTs 05/30/2017- decreased size of left hilar mass, left renal mass, retroperitoneal adenopathy, and adrenal metastasis.  Healing bone lesions. Cabozantinib continued CTs 10/21/2017- interval enlargement left hilar lymph node; stable rib lesions; stable mass left renal cortex; stable mildly nodular adrenal glands; stable lytic lesions within the pelvis and spine. Cabozantinib continued CTs 02/21/2018- enlargement of an AP window lymph node.  Mildly enlarged left hilar lymph node is unchanged.  Primary renal cell carcinoma  involving the upper pole of the left kidney appears similar.  Stable enlarged right periaortic lymph  node adjacent to the renal vessels.  Stable multifocal bony metastatic disease. Cabozantinib continued CTs 07/29/2018- stable 15 mm AP window nodes; stable left hilar node; subcarinal node slightly larger; right periaortic node 16 mm, previously 12 mm; portacaval node 24 mm, previously 16 mm; 15 mm node superior to the pancreatic head has enlarged; interval increase nodularity of both adrenal glands; stable left kidney mass; multifocal bony metastatic disease not significantly changed. Cabozantinib continued CTs 11/06/2018- moderate improvement in thoracic adenopathy; minimal improvement abdominal adenopathy; left kidney upper pole mass and various lytic expansile bone lesions stable; mild increase in nodularity of left adrenal gland; right adrenal gland nodularity stable. 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  Peripheral enhancing masses consistent with metastases. No hemorrhage. MRI brain 03/11/2019-7 enhancing brain masses consistent with metastatic disease SRS to 7 brain lesions, treatment given 03/19/2019, 03/23/2019, and 03/25/2019 CTs 04/09/2019-decrease in left renal mass, bilateral adrenal nodules, AP window and porta hepatic adenopathy.  New 9 mm right lower lobe nodule.  Improved lytic lesion of the left acetabulum.  Other bone lesions are stable. Cabozantinib continued MRI brain 07/17/2019-resolution of 4 mm treated lesion in the right frontal cortex, 6 remaining treated lesions have decreased in size, new punctate metastasis in the superior right cerebellum SRS to right cerebellar lesion 07/30/2019 CTs 08/17/2019-previously noted right lower lobe nodule resolved, stable left kidney mass, mixed lytic/sclerotic bone lesions in the thoracolumbar spine, right posterior ribs, and left  acetabulum-unchanged, no evidence of progressive disease  Cabozantinib continued MRI brain 10/23/2019-stable to slight decrease in size of multiple enhancing intracranial lesions.  Slight increase in surrounding edema in the medial left frontal lobe.  No new lesions present. MRI brain 01/29/2020-multiple enhancing brain lesions, some hemorrhagic, some with mild enlargement-treatment effect? CTs 02/08/2020-no thoracic metastases, enlargement of the left renal mass, enlargement of lytic lesion at T9, other lytic lesions unchanged MRI brain 05/04/2020-no new lesions, slight enlargement of a right occipital lesion, other lesions are stable or decreased in size CTs 08/11/2020-enlargement of left kidney mass, new 1.6 cm segment 7 liver lesion, enlargement of a lytic lesion at T9, increased lysis of a sclerotic lesion at the right second rib, 0.7 cm endoluminal nodule at the bladder dome-enlarged Brain MRI 08/10/2020-slight increase in a 15 mm left frontal lobe lesion, new punctate focus in the right occipital cortex, other lesions stable or slightly decreased CTs 11/01/2020-increase in left suprahilar opacity, enlargement of previous liver metastases, several new subcentimeter lesions, increase in left renal mass, similar appearance of bone metastases, stable hyperdense/enhancing nodule in the left bladder Brain MRI 11/04/2020-no change in brain metastases Lenvatinib/pembrolizumab 11/30/2020 Pain secondary to #1-managed by Dr. Lovenia Shuck.  Improved Hypertension Elevated transaminases 02/06/2016- Pazopanib placed on hold Liver enzymes normal 03/07/2016 Port-A-Cath placement 05/16/2016 Malaise/anorexia 09/05/2016. Cortisol and testosterone levels low. Hydrocortisone and testosterone replacement initiated. Conjunctival/scleral erythema 09/19/2016-resolved with steroid eyedrops Proximal right leg weakness. 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 Hypercalcemia status post Zometa  01/23/2017-resolved Pruritic rash following IV contrast 10/21/2017; rash following IV contrast 02/21/2018 despite prednisone/Benadryl premedication Brief episodes of expressive aphasia October and December 2020      Disposition: [name].  He is scheduled to begin treatment today with lenvatinib/pembrolizumab.  We again reviewed potential toxicities.  He agrees to proceed.  CBC from today reviewed.  Counts adequate to proceed.  Chemistry panel is pending.  He will return for repeat labs in approximately  2 weeks.  He will return for lab, follow-up, cycle 2 Pembrolizumab on 01/03/2021 (date per patient request due to a vacation).  He will contact the office in the interim with any problems.    [name] ANP/GNP-BC   11/30/2020  1:42 PM

## 2020-12-01 ENCOUNTER — Telehealth: Payer: Self-pay | Admitting: Emergency Medicine

## 2020-12-01 ENCOUNTER — Telehealth: Payer: Self-pay | Admitting: Oncology

## 2020-12-01 NOTE — Telephone Encounter (Signed)
Called Mr Tiley due to change of time appointment date and time Rs for 11/9 /22 Left message

## 2020-12-01 NOTE — Telephone Encounter (Signed)
24 Hour Callback Pt returned call from message left earlier. Pt tolerated 1st time Keytruda well. No side effects.

## 2020-12-01 NOTE — Telephone Encounter (Signed)
24 Hour Callback 24 hour callback made. No answer, left voicemail message

## 2020-12-08 ENCOUNTER — Other Ambulatory Visit (HOSPITAL_COMMUNITY): Payer: Self-pay

## 2020-12-08 ENCOUNTER — Other Ambulatory Visit: Payer: Self-pay | Admitting: Oncology

## 2020-12-08 ENCOUNTER — Other Ambulatory Visit: Payer: Self-pay | Admitting: Nurse Practitioner

## 2020-12-08 DIAGNOSIS — C649 Malignant neoplasm of unspecified kidney, except renal pelvis: Secondary | ICD-10-CM

## 2020-12-08 MED ORDER — TESTOSTERONE 1.62 % TD GEL
TRANSDERMAL | 3 refills | Status: DC
  Filled 2020-12-08: qty 75, 30d supply, fill #0
  Filled 2021-03-06: qty 75, 30d supply, fill #1
  Filled 2021-05-04: qty 75, 30d supply, fill #2

## 2020-12-08 MED ORDER — LIDOCAINE-PRILOCAINE 2.5-2.5 % EX CREA
TOPICAL_CREAM | CUTANEOUS | 2 refills | Status: DC
  Filled 2020-12-08: qty 30, 30d supply, fill #0
  Filled 2021-11-03: qty 30, 30d supply, fill #1

## 2020-12-08 MED ORDER — ONDANSETRON HCL 8 MG PO TABS
ORAL_TABLET | ORAL | 2 refills | Status: DC
  Filled 2020-12-08: qty 90, 30d supply, fill #0

## 2020-12-08 MED ORDER — TESTOSTERONE 1.62 % TD GEL: TRANSDERMAL | 3 refills | Status: DC

## 2020-12-12 ENCOUNTER — Other Ambulatory Visit: Payer: Self-pay | Admitting: Nurse Practitioner

## 2020-12-12 ENCOUNTER — Other Ambulatory Visit (HOSPITAL_COMMUNITY): Payer: Self-pay

## 2020-12-12 DIAGNOSIS — C649 Malignant neoplasm of unspecified kidney, except renal pelvis: Secondary | ICD-10-CM

## 2020-12-12 MED ORDER — OXYCODONE HCL 5 MG PO TABS
5.0000 mg | ORAL_TABLET | Freq: Four times a day (QID) | ORAL | 0 refills | Status: DC | PRN
  Filled 2020-12-12: qty 60, 15d supply, fill #0

## 2020-12-13 ENCOUNTER — Other Ambulatory Visit: Payer: Self-pay | Admitting: Nurse Practitioner

## 2020-12-13 ENCOUNTER — Other Ambulatory Visit (HOSPITAL_COMMUNITY): Payer: Self-pay

## 2020-12-13 DIAGNOSIS — C649 Malignant neoplasm of unspecified kidney, except renal pelvis: Secondary | ICD-10-CM

## 2020-12-13 MED ORDER — OXYCODONE HCL ER 10 MG PO T12A
EXTENDED_RELEASE_TABLET | Freq: Two times a day (BID) | ORAL | 0 refills | Status: DC
  Filled 2020-12-13: qty 120, 30d supply, fill #0

## 2020-12-14 ENCOUNTER — Other Ambulatory Visit (HOSPITAL_COMMUNITY): Payer: Self-pay

## 2020-12-16 ENCOUNTER — Inpatient Hospital Stay: Payer: PPO

## 2020-12-16 ENCOUNTER — Other Ambulatory Visit: Payer: Self-pay

## 2020-12-16 DIAGNOSIS — Z5112 Encounter for antineoplastic immunotherapy: Secondary | ICD-10-CM | POA: Diagnosis not present

## 2020-12-16 DIAGNOSIS — Z95828 Presence of other vascular implants and grafts: Secondary | ICD-10-CM

## 2020-12-16 DIAGNOSIS — C649 Malignant neoplasm of unspecified kidney, except renal pelvis: Secondary | ICD-10-CM

## 2020-12-16 LAB — CBC WITH DIFFERENTIAL (CANCER CENTER ONLY)
Abs Immature Granulocytes: 0.03 10*3/uL (ref 0.00–0.07)
Basophils Absolute: 0.1 10*3/uL (ref 0.0–0.1)
Basophils Relative: 1 %
Eosinophils Absolute: 0.6 10*3/uL — ABNORMAL HIGH (ref 0.0–0.5)
Eosinophils Relative: 7 %
HCT: 36.8 % — ABNORMAL LOW (ref 39.0–52.0)
Hemoglobin: 11.6 g/dL — ABNORMAL LOW (ref 13.0–17.0)
Immature Granulocytes: 0 %
Lymphocytes Relative: 29 %
Lymphs Abs: 2.7 10*3/uL (ref 0.7–4.0)
MCH: 28.4 pg (ref 26.0–34.0)
MCHC: 31.5 g/dL (ref 30.0–36.0)
MCV: 90.2 fL (ref 80.0–100.0)
Monocytes Absolute: 0.8 10*3/uL (ref 0.1–1.0)
Monocytes Relative: 9 %
Neutro Abs: 5.1 10*3/uL (ref 1.7–7.7)
Neutrophils Relative %: 54 %
Platelet Count: 292 10*3/uL (ref 150–400)
RBC: 4.08 MIL/uL — ABNORMAL LOW (ref 4.22–5.81)
RDW: 14.9 % (ref 11.5–15.5)
WBC Count: 9.3 10*3/uL (ref 4.0–10.5)
nRBC: 0 % (ref 0.0–0.2)

## 2020-12-16 LAB — CMP (CANCER CENTER ONLY)
ALT: 17 U/L (ref 0–44)
AST: 13 U/L — ABNORMAL LOW (ref 15–41)
Albumin: 3.9 g/dL (ref 3.5–5.0)
Alkaline Phosphatase: 85 U/L (ref 38–126)
Anion gap: 8 (ref 5–15)
BUN: 18 mg/dL (ref 8–23)
CO2: 28 mmol/L (ref 22–32)
Calcium: 9.1 mg/dL (ref 8.9–10.3)
Chloride: 98 mmol/L (ref 98–111)
Creatinine: 0.81 mg/dL (ref 0.61–1.24)
GFR, Estimated: >60 mL/min (ref >60)
Glucose, Bld: 116 mg/dL — ABNORMAL HIGH (ref 70–99)
Potassium: 4.5 mmol/L (ref 3.5–5.1)
Sodium: 134 mmol/L — ABNORMAL LOW (ref 135–145)
Total Bilirubin: 0.3 mg/dL (ref 0.3–1.2)
Total Protein: 6.4 g/dL — ABNORMAL LOW (ref 6.5–8.1)

## 2020-12-16 MED ORDER — SODIUM CHLORIDE 0.9% FLUSH
10.0000 mL | Freq: Once | INTRAVENOUS | Status: AC
  Administered 2020-12-16: 10 mL via INTRAVENOUS

## 2020-12-16 MED ORDER — HEPARIN SOD (PORK) LOCK FLUSH 100 UNIT/ML IV SOLN
500.0000 [IU] | Freq: Once | INTRAVENOUS | Status: AC
  Administered 2020-12-16: 500 [IU] via INTRAVENOUS

## 2020-12-16 NOTE — Patient Instructions (Signed)
[facility]  Discharge Instructions: Thank you for choosing [facility] to provide your oncology and hematology care.   If you have a lab appointment with the [facility], please go directly to the [facility] and check in at the registration area.   Wear comfortable clothing and clothing appropriate for easy access to any Portacath or PICC line.   We strive to give you quality time with your provider. You may need to reschedule your appointment if you arrive late (15 or more minutes).  Arriving late affects you and other patients whose appointments are after yours.  Also, if you miss three or more appointments without notifying the office, you may be dismissed from the clinic at the provider's discretion.      For prescription refill requests, have your pharmacy contact our office and allow 72 hours for refills to be completed.    Today you received the following chemotherapy and/or immunotherapy agents port flush      To help prevent nausea and vomiting after your treatment, we encourage you to take your nausea medication as directed.  BELOW ARE SYMPTOMS THAT SHOULD BE REPORTED IMMEDIATELY: *FEVER GREATER THAN 100.4 F (38 C) OR HIGHER *CHILLS OR SWEATING *NAUSEA AND VOMITING THAT IS NOT CONTROLLED WITH YOUR NAUSEA MEDICATION *UNUSUAL SHORTNESS OF BREATH *UNUSUAL BRUISING OR BLEEDING *URINARY PROBLEMS (pain or burning when urinating, or frequent urination) *BOWEL PROBLEMS (unusual diarrhea, constipation, pain near the anus) TENDERNESS IN MOUTH AND THROAT WITH OR WITHOUT PRESENCE OF ULCERS (sore throat, sores in mouth, or a toothache) UNUSUAL RASH, SWELLING OR PAIN  UNUSUAL VAGINAL DISCHARGE OR ITCHING   Items with * indicate a potential emergency and should be followed up as soon as possible or go to the Emergency Department if any problems should occur.  Please show the CHEMOTHERAPY ALERT CARD or IMMUNOTHERAPY ALERT CARD at check-in to the  Emergency Department and triage nurse.  Should you have questions after your visit or need to cancel or reschedule your appointment, please contact [facility]  Dept: [phone]  and follow the prompts.  Office hours are 8:00 a.m. to 4:30 p.m. Monday - Friday. Please note that voicemails left after 4:00 p.m. may not be returned until the following business day.  We are closed weekends and major holidays. You have access to a nurse at all times for urgent questions. Please call the main number to the clinic Dept: [phone] and follow the prompts.   For any non-urgent questions, you may also contact your provider using MyChart. We now offer e-Visits for anyone 27 and older to request care online for non-urgent symptoms. For details visit [email or web address].   Also download the MyChart app! Go to the app store, search "MyChart", open the app, select [facility], and log in with your MyChart username and password.  Due to Covid, a mask is required upon entering the hospital/clinic. If you do not have a mask, one will be given to you upon arrival. For doctor visits, patients may have 1 support person aged 4 or older with them. For treatment visits, patients cannot have anyone with them due to current Covid guidelines and our immunocompromised population.

## 2020-12-20 ENCOUNTER — Other Ambulatory Visit: Payer: Self-pay | Admitting: Oncology

## 2020-12-20 ENCOUNTER — Other Ambulatory Visit (HOSPITAL_COMMUNITY): Payer: Self-pay

## 2020-12-20 MED ORDER — LENVIMA (18 MG DAILY DOSE) 10 MG & 2 X 4 MG PO CPPK
18.0000 mg | ORAL_CAPSULE | Freq: Every day | ORAL | 0 refills | Status: DC
  Filled 2020-12-20: qty 90, 30d supply, fill #0

## 2020-12-22 ENCOUNTER — Other Ambulatory Visit (HOSPITAL_COMMUNITY): Payer: Self-pay

## 2021-01-01 ENCOUNTER — Other Ambulatory Visit: Payer: Self-pay | Admitting: Oncology

## 2021-01-03 ENCOUNTER — Other Ambulatory Visit: Payer: PPO

## 2021-01-03 ENCOUNTER — Ambulatory Visit: Payer: PPO

## 2021-01-03 ENCOUNTER — Ambulatory Visit: Payer: PPO | Admitting: Nurse Practitioner

## 2021-01-04 ENCOUNTER — Other Ambulatory Visit: Payer: Self-pay

## 2021-01-04 ENCOUNTER — Inpatient Hospital Stay: Payer: PPO | Attending: Nurse Practitioner

## 2021-01-04 ENCOUNTER — Inpatient Hospital Stay: Payer: PPO

## 2021-01-04 ENCOUNTER — Inpatient Hospital Stay (HOSPITAL_BASED_OUTPATIENT_CLINIC_OR_DEPARTMENT_OTHER): Payer: PPO | Admitting: Nurse Practitioner

## 2021-01-04 ENCOUNTER — Other Ambulatory Visit (HOSPITAL_COMMUNITY): Payer: Self-pay

## 2021-01-04 ENCOUNTER — Encounter: Payer: Self-pay | Admitting: Nurse Practitioner

## 2021-01-04 VITALS — BP 140/60 | HR 60 | Temp 98.1°F | Resp 18 | Ht 70.0 in | Wt 229.0 lb

## 2021-01-04 DIAGNOSIS — Z79899 Other long term (current) drug therapy: Secondary | ICD-10-CM | POA: Insufficient documentation

## 2021-01-04 DIAGNOSIS — C7931 Secondary malignant neoplasm of brain: Secondary | ICD-10-CM | POA: Diagnosis not present

## 2021-01-04 DIAGNOSIS — C642 Malignant neoplasm of left kidney, except renal pelvis: Secondary | ICD-10-CM | POA: Diagnosis not present

## 2021-01-04 DIAGNOSIS — C7951 Secondary malignant neoplasm of bone: Secondary | ICD-10-CM | POA: Diagnosis not present

## 2021-01-04 DIAGNOSIS — C649 Malignant neoplasm of unspecified kidney, except renal pelvis: Secondary | ICD-10-CM

## 2021-01-04 DIAGNOSIS — Z5112 Encounter for antineoplastic immunotherapy: Secondary | ICD-10-CM | POA: Insufficient documentation

## 2021-01-04 LAB — CBC WITH DIFFERENTIAL (CANCER CENTER ONLY)
Abs Immature Granulocytes: 0.02 10*3/uL (ref 0.00–0.07)
Basophils Absolute: 0 10*3/uL (ref 0.0–0.1)
Basophils Relative: 1 %
Eosinophils Absolute: 0.6 10*3/uL — ABNORMAL HIGH (ref 0.0–0.5)
Eosinophils Relative: 7 %
HCT: 37.3 % — ABNORMAL LOW (ref 39.0–52.0)
Hemoglobin: 11.9 g/dL — ABNORMAL LOW (ref 13.0–17.0)
Immature Granulocytes: 0 %
Lymphocytes Relative: 22 %
Lymphs Abs: 1.9 10*3/uL (ref 0.7–4.0)
MCH: 28.5 pg (ref 26.0–34.0)
MCHC: 31.9 g/dL (ref 30.0–36.0)
MCV: 89.2 fL (ref 80.0–100.0)
Monocytes Absolute: 0.6 10*3/uL (ref 0.1–1.0)
Monocytes Relative: 7 %
Neutro Abs: 5.6 10*3/uL (ref 1.7–7.7)
Neutrophils Relative %: 63 %
Platelet Count: 257 10*3/uL (ref 150–400)
RBC: 4.18 MIL/uL — ABNORMAL LOW (ref 4.22–5.81)
RDW: 14.4 % (ref 11.5–15.5)
WBC Count: 8.8 10*3/uL (ref 4.0–10.5)
nRBC: 0 % (ref 0.0–0.2)

## 2021-01-04 LAB — CMP (CANCER CENTER ONLY)
ALT: 14 U/L (ref 0–44)
AST: 14 U/L — ABNORMAL LOW (ref 15–41)
Albumin: 3.8 g/dL (ref 3.5–5.0)
Alkaline Phosphatase: 74 U/L (ref 38–126)
Anion gap: 8 (ref 5–15)
BUN: 10 mg/dL (ref 8–23)
CO2: 28 mmol/L (ref 22–32)
Calcium: 8.9 mg/dL (ref 8.9–10.3)
Chloride: 100 mmol/L (ref 98–111)
Creatinine: 0.73 mg/dL (ref 0.61–1.24)
GFR, Estimated: >60 mL/min (ref >60)
Glucose, Bld: 117 mg/dL — ABNORMAL HIGH (ref 70–99)
Potassium: 4.1 mmol/L (ref 3.5–5.1)
Sodium: 136 mmol/L (ref 135–145)
Total Bilirubin: 0.3 mg/dL (ref 0.3–1.2)
Total Protein: 6.8 g/dL (ref 6.5–8.1)

## 2021-01-04 LAB — TOTAL PROTEIN, URINE DIPSTICK: Protein, ur: NEGATIVE mg/dL

## 2021-01-04 LAB — TSH: TSH: 0.886 u[IU]/mL (ref 0.350–4.500)

## 2021-01-04 MED ORDER — SODIUM CHLORIDE 0.9 % IV SOLN
200.0000 mg | Freq: Once | INTRAVENOUS | Status: AC
  Administered 2021-01-04: 200 mg via INTRAVENOUS
  Filled 2021-01-04: qty 8

## 2021-01-04 MED ORDER — ONDANSETRON HCL 8 MG PO TABS
ORAL_TABLET | ORAL | 2 refills | Status: DC
  Filled 2021-01-04: qty 90, 30d supply, fill #0
  Filled 2021-02-07: qty 90, 30d supply, fill #1
  Filled 2021-03-06 – 2021-03-17 (×2): qty 90, 30d supply, fill #2

## 2021-01-04 MED ORDER — HEPARIN SOD (PORK) LOCK FLUSH 100 UNIT/ML IV SOLN
500.0000 [IU] | Freq: Once | INTRAVENOUS | Status: AC | PRN
  Administered 2021-01-04: 500 [IU]

## 2021-01-04 MED ORDER — SODIUM CHLORIDE 0.9 % IV SOLN: Freq: Once | INTRAVENOUS | Status: AC

## 2021-01-04 MED ORDER — PROCHLORPERAZINE MALEATE 10 MG PO TABS
10.0000 mg | ORAL_TABLET | Freq: Four times a day (QID) | ORAL | 2 refills | Status: DC | PRN
  Filled 2021-01-04: qty 30, 8d supply, fill #0

## 2021-01-04 MED ORDER — SODIUM CHLORIDE 0.9% FLUSH
10.0000 mL | INTRAVENOUS | Status: DC | PRN
  Administered 2021-01-04: 10 mL

## 2021-01-04 NOTE — Patient Instructions (Signed)
[facility]   °Discharge Instructions: °Thank you for choosing [facility] to provide your oncology and hematology care.  ° °If you have a lab appointment with the Cancer Center, please go directly to the Cancer Center and check in at the registration area. °  °Wear comfortable clothing and clothing appropriate for easy access to any Portacath or PICC line.  ° °We strive to give you quality time with your provider. You may need to reschedule your appointment if you arrive late (15 or more minutes).  Arriving late affects you and other patients whose appointments are after yours.  Also, if you miss three or more appointments without notifying the office, you may be dismissed from the clinic at the provider’s discretion.    °  °For prescription refill requests, have your pharmacy contact our office and allow 72 hours for refills to be completed.   ° °Today you received the following chemotherapy and/or immunotherapy agents Pembrolizumab (KEYTRUDA).    °  °To help prevent nausea and vomiting after your treatment, we encourage you to take your nausea medication as directed. ° °BELOW ARE SYMPTOMS THAT SHOULD BE REPORTED IMMEDIATELY: °*FEVER GREATER THAN 100.4 F (38 °C) OR HIGHER °*CHILLS OR SWEATING °*NAUSEA AND VOMITING THAT IS NOT CONTROLLED WITH YOUR NAUSEA MEDICATION °*UNUSUAL SHORTNESS OF BREATH °*UNUSUAL BRUISING OR BLEEDING °*URINARY PROBLEMS (pain or burning when urinating, or frequent urination) °*BOWEL PROBLEMS (unusual diarrhea, constipation, pain near the anus) °TENDERNESS IN MOUTH AND THROAT WITH OR WITHOUT PRESENCE OF ULCERS (sore throat, sores in mouth, or a toothache) °UNUSUAL RASH, SWELLING OR PAIN  °UNUSUAL VAGINAL DISCHARGE OR ITCHING  ° °Items with * indicate a potential emergency and should be followed up as soon as possible or go to the Emergency Department if any problems should occur. ° °Please show the CHEMOTHERAPY ALERT CARD or IMMUNOTHERAPY ALERT CARD  at check-in to the Emergency Department and triage nurse. ° °Should you have questions after your visit or need to cancel or reschedule your appointment, please contact [facility]  Dept: [phone]  and follow the prompts.  Office hours are 8:00 a.m. to 4:30 p.m. Monday - Friday. Please note that voicemails left after 4:00 p.m. may not be returned until the following business day.  We are closed weekends and major holidays. You have access to a nurse at all times for urgent questions. Please call the main number to the clinic Dept: [phone] and follow the prompts. ° ° °For any non-urgent questions, you may also contact your provider using MyChart. We now offer e-Visits for anyone 18 and older to request care online for non-urgent symptoms. For details visit [email or web address]. °  °Also download the MyChart app! Go to the app store, search "MyChart", open the app, select , and log in with your MyChart username and password. ° °Due to Covid, a mask is required upon entering the hospital/clinic. If you do not have a mask, one will be given to you upon arrival. For doctor visits, patients may have 1 support person aged 18 or older with them. For treatment visits, patients cannot have anyone with them due to current Covid guidelines and our immunocompromised population.  ° °Pembrolizumab injection °What is this medication? °PEMBROLIZUMAB (pem broe liz ue mab) is a monoclonal antibody. It is used to treat certain types of cancer. °This medicine may be used for other purposes; ask your health care provider or pharmacist if you have questions. °COMMON BRAND NAME(S):   Keytruda °What should I tell my care team before I take this medication? °They need to know if you have any of these conditions: °autoimmune diseases like Crohn's disease, ulcerative colitis, or lupus °have had or planning to have an allogeneic stem cell transplant (uses someone else's stem cells) °history of  organ transplant °history of chest radiation °nervous system problems like myasthenia gravis or Guillain-Barre syndrome °an unusual or allergic reaction to pembrolizumab, other medicines, foods, dyes, or preservatives °pregnant or trying to get pregnant °breast-feeding °How should I use this medication? °This medicine is for infusion into a vein. It is given by a health care professional in a hospital or clinic setting. °A special MedGuide will be given to you before each treatment. Be sure to read this information carefully each time. °Talk to your pediatrician regarding the use of this medicine in children. While this drug may be prescribed for children as young as 6 months for selected conditions, precautions do apply. °Overdosage: If you think you have taken too much of this medicine contact a poison control center or emergency room at once. °NOTE: This medicine is only for you. Do not share this medicine with others. °What if I miss a dose? °It is important not to miss your dose. Call your doctor or health care professional if you are unable to keep an appointment. °What may interact with this medication? °Interactions have not been studied. °This list may not describe all possible interactions. Give your health care provider a list of all the medicines, herbs, non-prescription drugs, or dietary supplements you use. Also tell them if you smoke, drink alcohol, or use illegal drugs. Some items may interact with your medicine. °What should I watch for while using this medication? °Your condition will be monitored carefully while you are receiving this medicine. °You may need blood work done while you are taking this medicine. °Do not become pregnant while taking this medicine or for 4 months after stopping it. Women should inform their doctor if they wish to become pregnant or think they might be pregnant. There is a potential for serious side effects to an unborn child. Talk to your health care professional or  pharmacist for more information. Do not breast-feed an infant while taking this medicine or for 4 months after the last dose. °What side effects may I notice from receiving this medication? °Side effects that you should report to your doctor or health care professional as soon as possible: °allergic reactions like skin rash, itching or hives, swelling of the face, lips, or tongue °bloody or black, tarry °breathing problems °changes in vision °chest pain °chills °confusion °constipation °cough °diarrhea °dizziness or feeling faint or lightheaded °fast or irregular heartbeat °fever °flushing °joint pain °low blood counts - this medicine may decrease the number of white blood cells, red blood cells and platelets. You may be at increased risk for infections and bleeding. °muscle pain °muscle weakness °pain, tingling, numbness in the hands or feet °persistent headache °redness, blistering, peeling or loosening of the skin, including inside the mouth °signs and symptoms of high blood sugar such as dizziness; dry mouth; dry skin; fruity breath; nausea; stomach pain; increased hunger or thirst; increased urination °signs and symptoms of kidney injury like trouble passing urine or change in the amount of urine °signs and symptoms of liver injury like dark urine, light-colored stools, loss of appetite, nausea, right upper belly pain, yellowing of the eyes or skin °sweating °swollen lymph nodes °weight loss °Side effects that usually do not   require medical attention (report to your doctor or health care professional if they continue or are bothersome): °decreased appetite °hair loss °tiredness °This list may not describe all possible side effects. Call your doctor for medical advice about side effects. You may report side effects to FDA at 1-800-FDA-1088. °Where should I keep my medication? °This drug is given in a hospital or clinic and will not be stored at home. °NOTE: This sheet is a summary. It may not cover all possible  information. If you have questions about this medicine, talk to your doctor, pharmacist, or health care provider. °© 2022 [employer] (2020-11-01 00:00:00) ° °

## 2021-01-04 NOTE — Progress Notes (Signed)
Andee Poles

## 2021-01-04 NOTE — Progress Notes (Signed)
Patient presents for treatment. RN assessment completed along with the following:  Labs/vitals reviewed - Yes, and within treatment parameters.   Weight within 10% of previous measurement - Yes Oncology Treatment Attestation completed for current therapy- Yes, on date 11/11/2020 Informed consent completed and reflects current therapy/intent - Yes, on date 11/30/2020             Provider progress note reviewed - Today's provider note is not yet available. I reviewed the most recent oncology provider progress note in chart dated 11/30/2020. Treatment/Antibody/Supportive plan reviewed - Yes, and there are no adjustments needed for today's treatment. S&H and other orders reviewed - Yes, and there are no additional orders identified. Previous treatment date reviewed - Yes, and the appropriate amount of time has elapsed between treatments. Clinic Hand Off Received from - Yes from Lattie Haw, NP  Patient to proceed with treatment.

## 2021-01-04 NOTE — Progress Notes (Signed)
[facility] OFFICE PROGRESS NOTE   Diagnosis: Renal cell carcinoma  INTERVAL HISTORY:   Phillip [name] returns as scheduled.  He continues lenvatinib.  He completed cycle 1 Pembrolizumab 11/30/2020.  He reports frequent nausea since beginning lenvatinib.  Zofran helps.  He has lost weight since his last visit.  No mouth sores.  No diarrhea.  No rash.  Occasional minor headache.  No double vision.  He would like to resume physical therapy.  Objective:  Vital signs in last 24 hours:  Blood pressure 140/60, pulse 60, temperature 98.1 F (36.7 C), temperature source Oral, resp. rate 18, height 5\' 10"  (1.778 m), weight 229 lb (103.9 kg), SpO2 100 %.    HEENT: No thrush or ulcers. Resp: Lungs clear bilaterally. Cardio: Regular rate and rhythm. GI: Abdomen soft and nontender.  No hepatomegaly. Vascular: No leg edema. Neuro: Alert and oriented.  Follows commands.  Ambulates with a cane. Skin: No rash. Port-A-Cath without erythema   Lab Results:  Lab Results  Component Value Date   WBC 8.8 01/04/2021   HGB 11.9 (L) 01/04/2021   HCT 37.3 (L) 01/04/2021   MCV 89.2 01/04/2021   PLT 257 01/04/2021   NEUTROABS 5.6 01/04/2021    Imaging:  No results found.  Medications: I have reviewed the patient's current medications.  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, L4 nerve compression Biopsy of the L4 mass 11/18/2015 confirmed metastatic renal cell carcinoma, clear cell type CTs of the chest, abdomen, and pelvis 11/18/2015-right lower lobe nodule, expansile lytic lesion at the right 11th rib/costal vertebral junction, left renal mass, expansile lesion involving the L4 vertebra, lytic lesion at the left acetabulum, and a low-attenuation liver lesion Initiation of SRS to L4 12/02/2015, Completed 12/12/2015 Initiation of Pazopanib 12/30/2015 Pazopanib placed on hold 02/06/2016 secondary to elevated liver enzymes Pazopanib  resumed 03/07/2016 at a dose of 400 mg daily  Pazopanib discontinued 03/19/2016 secondary to elevated liver enzymes Restaging CTs 04/02/2016-stable left renal mass, decreased soft tissue component associated with the L4 metastasis, increased soft tissue component associated with the right 11th rib metastasis with increased T11 bony destruction, increased sclerosis at the left acetabulum lesion Cycle 1 nivolumab 04/12/2016 Cycle 2 nivolumab 04/26/2016 Cycle 3 nivolumab 05/11/2016 Cycle 4 nivolumab 05/24/2016 Cycle 5 nivolumab 06/08/2016 MRI lumbar spine 06/21/2016-unchanged tumor at L3, increased size of retroperitoneal lymph nodes compared to a CT from 04/02/2016 Cycle 6 nivolumab  06/22/2016 CTs chest, abdomen, and pelvis 07/04/2016-enlargement of the left renal mass, right adrenal nodule, left hilar and peritoneal lymph nodes, enlargement of left acetabular lesion. Stable lung nodules. Cycle 7 nivolumab 07/06/2016 Cycle 8 nivolumab 07/20/2016 Cycle 9 nivolumab 08/02/2016 Cycle 10 nivolumab 08/20/2016 Restaging CT 09/03/2016 evaluation with stable disease Cycle 11 nivolumab 09/05/2016 Cycle 12 nivolumab 09/19/2016 Cycle 13 nivolumab 10/03/2016 Cycle 14 nivolumab 10/17/2016 Cycle 15 nivolumab 10/31/2016 Cycle 16 nivolumab 11/14/2016 (changed to monthly schedule) Cycle 17 nivolumab 12/19/2016 CTs 01/21/2017-increased left renal mass, increased size of adrenal metastases, increased lytic bone lesions, increased left lung nodule, persistent tumor at L4 with probable epidural component Initiation of Cabozantinib 01/28/2017 Restaging CTs 05/30/2017- decreased size of left hilar mass, left renal mass, retroperitoneal adenopathy, and adrenal metastasis.  Healing bone lesions. Cabozantinib continued CTs 10/21/2017- interval enlargement left hilar lymph node; stable rib lesions; stable mass left renal cortex; stable mildly nodular adrenal glands; stable lytic lesions within the pelvis and  spine. Cabozantinib continued CTs 02/21/2018- enlargement of an AP window lymph node.  Mildly enlarged left  hilar lymph node is unchanged.  Primary renal cell carcinoma involving the upper pole of the left kidney appears similar.  Stable enlarged right periaortic lymph node adjacent to the renal vessels.  Stable multifocal bony metastatic disease. Cabozantinib continued CTs 07/29/2018- stable 15 mm AP window nodes; stable left hilar node; subcarinal node slightly larger; right periaortic node 16 mm, previously 12 mm; portacaval node 24 mm, previously 16 mm; 15 mm node superior to the pancreatic head has enlarged; interval increase nodularity of both adrenal glands; stable left kidney mass; multifocal bony metastatic disease not significantly changed. Cabozantinib continued CTs 11/06/2018- moderate improvement in thoracic adenopathy; minimal improvement abdominal adenopathy; left kidney upper pole mass and various lytic expansile bone lesions stable; mild increase in nodularity of left adrenal gland; right adrenal gland nodularity stable. 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  Peripheral enhancing masses consistent with metastases. No hemorrhage. MRI brain 03/11/2019-7 enhancing brain masses consistent with metastatic disease SRS to 7 brain lesions, treatment given 03/19/2019, 03/23/2019, and 03/25/2019 CTs 04/09/2019-decrease in left renal mass, bilateral adrenal nodules, AP window and porta hepatic adenopathy.  New 9 mm right lower lobe nodule.  Improved lytic lesion of the left acetabulum.  Other bone lesions are stable. Cabozantinib continued MRI brain 07/17/2019-resolution of 4 mm treated lesion in the right frontal cortex, 6 remaining treated lesions have decreased in size, new punctate metastasis in the superior right cerebellum SRS to right cerebellar lesion 07/30/2019 CTs  08/17/2019-previously noted right lower lobe nodule resolved, stable left kidney mass, mixed lytic/sclerotic bone lesions in the thoracolumbar spine, right posterior ribs, and left acetabulum-unchanged, no evidence of progressive disease  Cabozantinib continued MRI brain 10/23/2019-stable to slight decrease in size of multiple enhancing intracranial lesions.  Slight increase in surrounding edema in the medial left frontal lobe.  No new lesions present. MRI brain 01/29/2020-multiple enhancing brain lesions, some hemorrhagic, some with mild enlargement-treatment effect? CTs 02/08/2020-no thoracic metastases, enlargement of the left renal mass, enlargement of lytic lesion at T9, other lytic lesions unchanged MRI brain 05/04/2020-no new lesions, slight enlargement of a right occipital lesion, other lesions are stable or decreased in size CTs 08/11/2020-enlargement of left kidney mass, new 1.6 cm segment 7 liver lesion, enlargement of a lytic lesion at T9, increased lysis of a sclerotic lesion at the right second rib, 0.7 cm endoluminal nodule at the bladder dome-enlarged Brain MRI 08/10/2020-slight increase in a 15 mm left frontal lobe lesion, new punctate focus in the right occipital cortex, other lesions stable or slightly decreased CTs 11/01/2020-increase in left suprahilar opacity, enlargement of previous liver metastases, several new subcentimeter lesions, increase in left renal mass, similar appearance of bone metastases, stable hyperdense/enhancing nodule in the left bladder Brain MRI 11/04/2020-no change in brain metastases Lenvatinib/pembrolizumab 11/30/2020 Pain secondary to #1-managed by Dr. Lovenia Shuck.  Improved Hypertension Elevated transaminases 02/06/2016- Pazopanib placed on hold Liver enzymes normal 03/07/2016 Port-A-Cath placement 05/16/2016 Malaise/anorexia 09/05/2016. Cortisol and testosterone levels low. Hydrocortisone and testosterone replacement initiated. Conjunctival/scleral erythema  09/19/2016-resolved with steroid eyedrops Proximal right leg weakness. 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 Hypercalcemia status post Zometa 01/23/2017-resolved Pruritic rash following IV contrast 10/21/2017; rash following IV contrast 02/21/2018 despite prednisone/Benadryl premedication Brief episodes of expressive aphasia October and December 2020    Disposition: Phillip [name] appears stable.  He has completed 1 cycle of Pembrolizumab.  Plan to proceed with cycle 2 today as scheduled.  He will  continue lenvatinib.  The nausea may be related to lenvatinib.  He will take Zofran 8 mg twice daily.  I am also sending a prescription to his pharmacy for Compazine 10 mg every 6 hours as needed.  CBC and chemistry panel reviewed.  Labs adequate to proceed as above.  He will return for lab, follow-up, Pembrolizumab in 3 weeks.  He will contact the office in the interim with any problems.  We specifically discussed poorly controlled nausea.    [name] ANP/GNP-BC   01/04/2021  2:00 PM

## 2021-01-10 ENCOUNTER — Other Ambulatory Visit (HOSPITAL_COMMUNITY): Payer: Self-pay

## 2021-01-10 ENCOUNTER — Other Ambulatory Visit: Payer: Self-pay | Admitting: Oncology

## 2021-01-10 ENCOUNTER — Other Ambulatory Visit: Payer: Self-pay | Admitting: Nurse Practitioner

## 2021-01-10 DIAGNOSIS — C649 Malignant neoplasm of unspecified kidney, except renal pelvis: Secondary | ICD-10-CM

## 2021-01-10 MED ORDER — LOSARTAN POTASSIUM-HCTZ 100-12.5 MG PO TABS
ORAL_TABLET | ORAL | 3 refills | Status: DC
  Filled 2021-01-10: qty 90, 90d supply, fill #0
  Filled 2021-04-05: qty 90, 90d supply, fill #1
  Filled 2021-07-04: qty 90, 90d supply, fill #2
  Filled 2021-10-03: qty 90, 90d supply, fill #3

## 2021-01-11 ENCOUNTER — Other Ambulatory Visit (HOSPITAL_COMMUNITY): Payer: Self-pay

## 2021-01-11 ENCOUNTER — Encounter: Payer: Self-pay | Admitting: Oncology

## 2021-01-11 ENCOUNTER — Other Ambulatory Visit: Payer: Self-pay | Admitting: Nurse Practitioner

## 2021-01-11 DIAGNOSIS — C649 Malignant neoplasm of unspecified kidney, except renal pelvis: Secondary | ICD-10-CM

## 2021-01-11 MED ORDER — OXYCODONE HCL 5 MG PO TABS
5.0000 mg | ORAL_TABLET | Freq: Four times a day (QID) | ORAL | 0 refills | Status: DC | PRN
  Filled 2021-01-11: qty 60, 15d supply, fill #0

## 2021-01-11 MED ORDER — OXYCODONE HCL ER 10 MG PO T12A
EXTENDED_RELEASE_TABLET | Freq: Two times a day (BID) | ORAL | 0 refills | Status: DC
  Filled 2021-01-11: qty 120, 30d supply, fill #0

## 2021-01-11 MED ORDER — LEVETIRACETAM 500 MG PO TABS
ORAL_TABLET | Freq: Two times a day (BID) | ORAL | 2 refills | Status: DC
  Filled 2021-01-11: qty 60, 30d supply, fill #0
  Filled 2021-02-07: qty 60, 30d supply, fill #1
  Filled 2021-03-06: qty 60, 30d supply, fill #2

## 2021-01-11 MED ORDER — MIRTAZAPINE 30 MG PO TABS
ORAL_TABLET | ORAL | 5 refills | Status: DC
  Filled 2021-01-11: qty 90, 90d supply, fill #0
  Filled 2021-04-05: qty 90, 90d supply, fill #1
  Filled 2021-07-04: qty 90, 90d supply, fill #2
  Filled 2021-10-03: qty 90, 90d supply, fill #3

## 2021-01-11 MED ORDER — HYDROCORTISONE 10 MG PO TABS
ORAL_TABLET | Freq: Every day | ORAL | 3 refills | Status: DC
  Filled 2021-01-11: qty 90, 30d supply, fill #0
  Filled 2021-02-07: qty 90, 30d supply, fill #1
  Filled 2021-02-07: qty 90, 90d supply, fill #1
  Filled 2021-03-06: qty 90, 30d supply, fill #2
  Filled 2021-04-05: qty 90, 30d supply, fill #3

## 2021-01-12 ENCOUNTER — Telehealth: Payer: Self-pay | Admitting: *Deleted

## 2021-01-12 ENCOUNTER — Other Ambulatory Visit: Payer: Self-pay | Admitting: Oncology

## 2021-01-12 ENCOUNTER — Other Ambulatory Visit (HOSPITAL_COMMUNITY): Payer: Self-pay

## 2021-01-12 NOTE — Telephone Encounter (Signed)
Left VM to inquire if he has been seen yet by physical therapy (referral placed 11/09) at [place].

## 2021-01-13 ENCOUNTER — Other Ambulatory Visit (HOSPITAL_COMMUNITY): Payer: Self-pay

## 2021-01-13 MED ORDER — LENVIMA (18 MG DAILY DOSE) 10 MG & 2 X 4 MG PO CPPK
18.0000 mg | ORAL_CAPSULE | Freq: Every day | ORAL | 0 refills | Status: DC
  Filled 2021-01-13: qty 90, 30d supply, fill #0

## 2021-01-14 ENCOUNTER — Other Ambulatory Visit (HOSPITAL_COMMUNITY): Payer: Self-pay

## 2021-01-16 ENCOUNTER — Other Ambulatory Visit (HOSPITAL_COMMUNITY): Payer: Self-pay

## 2021-01-16 NOTE — Telephone Encounter (Signed)
Left 2nd VM to follow up on if his physical therapy referral went through.

## 2021-01-22 ENCOUNTER — Other Ambulatory Visit: Payer: Self-pay | Admitting: Oncology

## 2021-01-24 ENCOUNTER — Inpatient Hospital Stay: Payer: PPO | Admitting: Oncology

## 2021-01-24 ENCOUNTER — Other Ambulatory Visit: Payer: Self-pay

## 2021-01-24 ENCOUNTER — Inpatient Hospital Stay: Payer: PPO

## 2021-01-24 VITALS — BP 148/63 | HR 62 | Temp 98.1°F | Resp 18

## 2021-01-24 VITALS — BP 166/78 | HR 60 | Temp 98.1°F | Resp 18 | Ht 70.0 in | Wt 227.0 lb

## 2021-01-24 DIAGNOSIS — C649 Malignant neoplasm of unspecified kidney, except renal pelvis: Secondary | ICD-10-CM

## 2021-01-24 DIAGNOSIS — C642 Malignant neoplasm of left kidney, except renal pelvis: Secondary | ICD-10-CM

## 2021-01-24 DIAGNOSIS — Z5112 Encounter for antineoplastic immunotherapy: Secondary | ICD-10-CM | POA: Diagnosis not present

## 2021-01-24 LAB — CMP (CANCER CENTER ONLY)
ALT: 33 U/L (ref 0–44)
AST: 29 U/L (ref 15–41)
Albumin: 4.1 g/dL (ref 3.5–5.0)
Alkaline Phosphatase: 86 U/L (ref 38–126)
Anion gap: 7 (ref 5–15)
BUN: 18 mg/dL (ref 8–23)
CO2: 29 mmol/L (ref 22–32)
Calcium: 9.4 mg/dL (ref 8.9–10.3)
Chloride: 101 mmol/L (ref 98–111)
Creatinine: 0.83 mg/dL (ref 0.61–1.24)
GFR, Estimated: >60 mL/min (ref >60)
Glucose, Bld: 102 mg/dL — ABNORMAL HIGH (ref 70–99)
Potassium: 4.3 mmol/L (ref 3.5–5.1)
Sodium: 137 mmol/L (ref 135–145)
Total Bilirubin: 0.4 mg/dL (ref 0.3–1.2)
Total Protein: 6.5 g/dL (ref 6.5–8.1)

## 2021-01-24 LAB — CBC WITH DIFFERENTIAL (CANCER CENTER ONLY)
Abs Immature Granulocytes: 0.02 10*3/uL (ref 0.00–0.07)
Basophils Absolute: 0.1 10*3/uL (ref 0.0–0.1)
Basophils Relative: 1 %
Eosinophils Absolute: 0.8 10*3/uL — ABNORMAL HIGH (ref 0.0–0.5)
Eosinophils Relative: 8 %
HCT: 37.5 % — ABNORMAL LOW (ref 39.0–52.0)
Hemoglobin: 11.7 g/dL — ABNORMAL LOW (ref 13.0–17.0)
Immature Granulocytes: 0 %
Lymphocytes Relative: 22 %
Lymphs Abs: 2.2 10*3/uL (ref 0.7–4.0)
MCH: 28 pg (ref 26.0–34.0)
MCHC: 31.2 g/dL (ref 30.0–36.0)
MCV: 89.7 fL (ref 80.0–100.0)
Monocytes Absolute: 0.7 10*3/uL (ref 0.1–1.0)
Monocytes Relative: 7 %
Neutro Abs: 6.2 10*3/uL (ref 1.7–7.7)
Neutrophils Relative %: 62 %
Platelet Count: 269 10*3/uL (ref 150–400)
RBC: 4.18 MIL/uL — ABNORMAL LOW (ref 4.22–5.81)
RDW: 15 % (ref 11.5–15.5)
WBC Count: 10 10*3/uL (ref 4.0–10.5)
nRBC: 0 % (ref 0.0–0.2)

## 2021-01-24 LAB — TOTAL PROTEIN, URINE DIPSTICK

## 2021-01-24 LAB — TSH: TSH: 1.319 u[IU]/mL (ref 0.350–4.500)

## 2021-01-24 MED ORDER — SODIUM CHLORIDE 0.9 % IV SOLN: Freq: Once | INTRAVENOUS | Status: AC

## 2021-01-24 MED ORDER — SODIUM CHLORIDE 0.9% FLUSH: 10.0000 mL | INTRAVENOUS | Status: DC | PRN

## 2021-01-24 MED ORDER — HEPARIN SOD (PORK) LOCK FLUSH 100 UNIT/ML IV SOLN: 500.0000 [IU] | Freq: Once | INTRAVENOUS | Status: DC | PRN

## 2021-01-24 MED ORDER — SODIUM CHLORIDE 0.9 % IV SOLN
200.0000 mg | Freq: Once | INTRAVENOUS | Status: AC
  Administered 2021-01-24: 200 mg via INTRAVENOUS
  Filled 2021-01-24: qty 8

## 2021-01-24 NOTE — Progress Notes (Signed)
[place] OFFICE PROGRESS NOTE   Diagnosis: Renal cell carcinoma  INTERVAL HISTORY:   Phillip [name] returns as scheduled.  He continues lenvatinib.  No rash or diarrhea.  He developed nausea when he started lenvatinib.  The nausea is now controlled with ondansetron.  Stable pain.  No seizures.  Objective:  Vital signs in last 24 hours:  Blood pressure (!) 166/78, pulse 60, temperature 98.1 F (36.7 C), temperature source Oral, resp. rate 18, height 5\' 10"  (1.778 m), weight 227 lb (103 kg), SpO2 100 %.    HEENT: No thrush or ulcers Resp: Lungs clear bilaterally Cardio: Regular rate and rhythm GI: No hepatosplenomegaly Vascular: No leg edema  Skin: Mild erythema at the upper anterior chest  Portacath/PICC-without erythema  Lab Results:  Lab Results  Component Value Date   WBC 10.0 01/24/2021   HGB 11.7 (L) 01/24/2021   HCT 37.5 (L) 01/24/2021   MCV 89.7 01/24/2021   PLT 269 01/24/2021   NEUTROABS 6.2 01/24/2021    CMP  Lab Results  Component Value Date   NA 137 01/24/2021   K 4.3 01/24/2021   CL 101 01/24/2021   CO2 29 01/24/2021   GLUCOSE 102 (H) 01/24/2021   BUN 18 01/24/2021   CREATININE 0.83 01/24/2021   CALCIUM 9.4 01/24/2021   PROT 6.5 01/24/2021   ALBUMIN 4.1 01/24/2021   AST 29 01/24/2021   ALT 33 01/24/2021   ALKPHOS 86 01/24/2021   BILITOT 0.4 01/24/2021   GFRNONAA >60 01/24/2021   GFRAA >60 11/05/2019    Lab Results  Component Value Date   CEA1 1.77 01/04/2016   Medications: I have reviewed the patient's current medications.   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, L4 nerve compression Biopsy of the L4 mass 11/18/2015 confirmed metastatic renal cell carcinoma, clear cell type CTs of the chest, abdomen, and pelvis 11/18/2015-right lower lobe nodule, expansile lytic lesion at the right 11th rib/costal vertebral junction, left renal mass, expansile lesion involving the L4  vertebra, lytic lesion at the left acetabulum, and a low-attenuation liver lesion Initiation of SRS to L4 12/02/2015, Completed 12/12/2015 Initiation of Pazopanib 12/30/2015 Pazopanib placed on hold 02/06/2016 secondary to elevated liver enzymes Pazopanib resumed 03/07/2016 at a dose of 400 mg daily  Pazopanib discontinued 03/19/2016 secondary to elevated liver enzymes Restaging CTs 04/02/2016-stable left renal mass, decreased soft tissue component associated with the L4 metastasis, increased soft tissue component associated with the right 11th rib metastasis with increased T11 bony destruction, increased sclerosis at the left acetabulum lesion Cycle 1 nivolumab 04/12/2016 Cycle 2 nivolumab 04/26/2016 Cycle 3 nivolumab 05/11/2016 Cycle 4 nivolumab 05/24/2016 Cycle 5 nivolumab 06/08/2016 MRI lumbar spine 06/21/2016-unchanged tumor at L3, increased size of retroperitoneal lymph nodes compared to a CT from 04/02/2016 Cycle 6 nivolumab  06/22/2016 CTs chest, abdomen, and pelvis 07/04/2016-enlargement of the left renal mass, right adrenal nodule, left hilar and peritoneal lymph nodes, enlargement of left acetabular lesion. Stable lung nodules. Cycle 7 nivolumab 07/06/2016 Cycle 8 nivolumab 07/20/2016 Cycle 9 nivolumab 08/02/2016 Cycle 10 nivolumab 08/20/2016 Restaging CT 09/03/2016 evaluation with stable disease Cycle 11 nivolumab 09/05/2016 Cycle 12 nivolumab 09/19/2016 Cycle 13 nivolumab 10/03/2016 Cycle 14 nivolumab 10/17/2016 Cycle 15 nivolumab 10/31/2016 Cycle 16 nivolumab 11/14/2016 (changed to monthly schedule) Cycle 17 nivolumab 12/19/2016 CTs 01/21/2017-increased left renal mass, increased size of adrenal metastases, increased lytic bone lesions, increased left lung nodule, persistent tumor at L4 with probable epidural component Initiation of Cabozantinib 01/28/2017 Restaging CTs 05/30/2017- decreased size  of left hilar mass, left renal mass, retroperitoneal adenopathy, and adrenal  metastasis.  Healing bone lesions. Cabozantinib continued CTs 10/21/2017- interval enlargement left hilar lymph node; stable rib lesions; stable mass left renal cortex; stable mildly nodular adrenal glands; stable lytic lesions within the pelvis and spine. Cabozantinib continued CTs 02/21/2018- enlargement of an AP window lymph node.  Mildly enlarged left hilar lymph node is unchanged.  Primary renal cell carcinoma involving the upper pole of the left kidney appears similar.  Stable enlarged right periaortic lymph node adjacent to the renal vessels.  Stable multifocal bony metastatic disease. Cabozantinib continued CTs 07/29/2018- stable 15 mm AP window nodes; stable left hilar node; subcarinal node slightly larger; right periaortic node 16 mm, previously 12 mm; portacaval node 24 mm, previously 16 mm; 15 mm node superior to the pancreatic head has enlarged; interval increase nodularity of both adrenal glands; stable left kidney mass; multifocal bony metastatic disease not significantly changed. Cabozantinib continued CTs 11/06/2018- moderate improvement in thoracic adenopathy; minimal improvement abdominal adenopathy; left kidney upper pole mass and various lytic expansile bone lesions stable; mild increase in nodularity of left adrenal gland; right adrenal gland nodularity stable. 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  Peripheral enhancing masses consistent with metastases. No hemorrhage. MRI brain 03/11/2019-7 enhancing brain masses consistent with metastatic disease SRS to 7 brain lesions, treatment given 03/19/2019, 03/23/2019, and 03/25/2019 CTs 04/09/2019-decrease in left renal mass, bilateral adrenal nodules, AP window and porta hepatic adenopathy.  New 9 mm right lower lobe nodule.  Improved lytic lesion of the left acetabulum.  Other bone lesions are stable. Cabozantinib continued MRI  brain 07/17/2019-resolution of 4 mm treated lesion in the right frontal cortex, 6 remaining treated lesions have decreased in size, new punctate metastasis in the superior right cerebellum SRS to right cerebellar lesion 07/30/2019 CTs 08/17/2019-previously noted right lower lobe nodule resolved, stable left kidney mass, mixed lytic/sclerotic bone lesions in the thoracolumbar spine, right posterior ribs, and left acetabulum-unchanged, no evidence of progressive disease  Cabozantinib continued MRI brain 10/23/2019-stable to slight decrease in size of multiple enhancing intracranial lesions.  Slight increase in surrounding edema in the medial left frontal lobe.  No new lesions present. MRI brain 01/29/2020-multiple enhancing brain lesions, some hemorrhagic, some with mild enlargement-treatment effect? CTs 02/08/2020-no thoracic metastases, enlargement of the left renal mass, enlargement of lytic lesion at T9, other lytic lesions unchanged MRI brain 05/04/2020-no new lesions, slight enlargement of a right occipital lesion, other lesions are stable or decreased in size CTs 08/11/2020-enlargement of left kidney mass, new 1.6 cm segment 7 liver lesion, enlargement of a lytic lesion at T9, increased lysis of a sclerotic lesion at the right second rib, 0.7 cm endoluminal nodule at the bladder dome-enlarged Brain MRI 08/10/2020-slight increase in a 15 mm left frontal lobe lesion, new punctate focus in the right occipital cortex, other lesions stable or slightly decreased CTs 11/01/2020-increase in left suprahilar opacity, enlargement of previous liver metastases, several new subcentimeter lesions, increase in left renal mass, similar appearance of bone metastases, stable hyperdense/enhancing nodule in the left bladder Brain MRI 11/04/2020-no change in brain metastases Lenvatinib/pembrolizumab 11/30/2020 Pain secondary to #1-managed by Dr. Lovenia Shuck.  Improved Hypertension Elevated transaminases 02/06/2016- Pazopanib placed on  hold Liver enzymes normal 03/07/2016 Port-A-Cath placement 05/16/2016 Malaise/anorexia 09/05/2016. Cortisol and testosterone levels low. Hydrocortisone and testosterone replacement initiated. Conjunctival/scleral erythema 09/19/2016-resolved with steroid eyedrops Proximal right leg weakness. Likely related to chronic nerve damage  from the destructive process at L4. Hypercalcemia status post Zometa 01/23/2017-resolved Pruritic rash following IV contrast 10/21/2017; rash following IV contrast 02/21/2018 despite prednisone/Benadryl premedication Brief episodes of expressive aphasia October and December 2020      Disposition: Phillip [name] appears stable.  He is currently tolerating the lenvatinib and pembrolizumab well.  Nausea has improved with Zofran.  He will continue the current regimen.  He will return for an office visit and pembrolizumab in 3 weeks.  We will plan for a restaging CT evaluation after the fourth treatment with pembrolizumab.  Phillip Coder, MD  01/24/2021  1:07 PM

## 2021-01-24 NOTE — Patient Instructions (Signed)
[facility]   °Discharge Instructions: °Thank you for choosing [facility] to provide your oncology and hematology care.  ° °If you have a lab appointment with the Cancer Center, please go directly to the Cancer Center and check in at the registration area. °  °Wear comfortable clothing and clothing appropriate for easy access to any Portacath or PICC line.  ° °We strive to give you quality time with your provider. You may need to reschedule your appointment if you arrive late (15 or more minutes).  Arriving late affects you and other patients whose appointments are after yours.  Also, if you miss three or more appointments without notifying the office, you may be dismissed from the clinic at the provider’s discretion.    °  °For prescription refill requests, have your pharmacy contact our office and allow 72 hours for refills to be completed.   ° °Today you received the following chemotherapy and/or immunotherapy agents Pembrolizumab (KEYTRUDA).    °  °To help prevent nausea and vomiting after your treatment, we encourage you to take your nausea medication as directed. ° °BELOW ARE SYMPTOMS THAT SHOULD BE REPORTED IMMEDIATELY: °*FEVER GREATER THAN 100.4 F (38 °C) OR HIGHER °*CHILLS OR SWEATING °*NAUSEA AND VOMITING THAT IS NOT CONTROLLED WITH YOUR NAUSEA MEDICATION °*UNUSUAL SHORTNESS OF BREATH °*UNUSUAL BRUISING OR BLEEDING °*URINARY PROBLEMS (pain or burning when urinating, or frequent urination) °*BOWEL PROBLEMS (unusual diarrhea, constipation, pain near the anus) °TENDERNESS IN MOUTH AND THROAT WITH OR WITHOUT PRESENCE OF ULCERS (sore throat, sores in mouth, or a toothache) °UNUSUAL RASH, SWELLING OR PAIN  °UNUSUAL VAGINAL DISCHARGE OR ITCHING  ° °Items with * indicate a potential emergency and should be followed up as soon as possible or go to the Emergency Department if any problems should occur. ° °Please show the CHEMOTHERAPY ALERT CARD or IMMUNOTHERAPY ALERT CARD  at check-in to the Emergency Department and triage nurse. ° °Should you have questions after your visit or need to cancel or reschedule your appointment, please contact [facility]  Dept: [phone]  and follow the prompts.  Office hours are 8:00 a.m. to 4:30 p.m. Monday - Friday. Please note that voicemails left after 4:00 p.m. may not be returned until the following business day.  We are closed weekends and major holidays. You have access to a nurse at all times for urgent questions. Please call the main number to the clinic Dept: [phone] and follow the prompts. ° ° °For any non-urgent questions, you may also contact your provider using MyChart. We now offer e-Visits for anyone 18 and older to request care online for non-urgent symptoms. For details visit [email or web address]. °  °Also download the MyChart app! Go to the app store, search "MyChart", open the app, select [facility], and log in with your MyChart username and password. ° °Due to Covid, a mask is required upon entering the hospital/clinic. If you do not have a mask, one will be given to you upon arrival. For doctor visits, patients may have 1 support person aged 18 or older with them. For treatment visits, patients cannot have anyone with them due to current Covid guidelines and our immunocompromised population.  ° °Pembrolizumab injection °What is this medication? °PEMBROLIZUMAB (pem broe liz ue mab) is a monoclonal antibody. It is used to treat certain types of cancer. °This medicine may be used for other purposes; ask your health care provider or pharmacist if you have questions. °COMMON BRAND NAME(S):   Keytruda °What should I tell my care team before I take this medication? °They need to know if you have any of these conditions: °autoimmune diseases like Crohn's disease, ulcerative colitis, or lupus °have had or planning to have an allogeneic stem cell transplant (uses someone else's stem cells) °history of  organ transplant °history of chest radiation °nervous system problems like myasthenia gravis or Guillain-Barre syndrome °an unusual or allergic reaction to pembrolizumab, other medicines, foods, dyes, or preservatives °pregnant or trying to get pregnant °breast-feeding °How should I use this medication? °This medicine is for infusion into a vein. It is given by a health care professional in a hospital or clinic setting. °A special MedGuide will be given to you before each treatment. Be sure to read this information carefully each time. °Talk to your pediatrician regarding the use of this medicine in children. While this drug may be prescribed for children as young as 6 months for selected conditions, precautions do apply. °Overdosage: If you think you have taken too much of this medicine contact a poison control center or emergency room at once. °NOTE: This medicine is only for you. Do not share this medicine with others. °What if I miss a dose? °It is important not to miss your dose. Call your doctor or health care professional if you are unable to keep an appointment. °What may interact with this medication? °Interactions have not been studied. °This list may not describe all possible interactions. Give your health care provider a list of all the medicines, herbs, non-prescription drugs, or dietary supplements you use. Also tell them if you smoke, drink alcohol, or use illegal drugs. Some items may interact with your medicine. °What should I watch for while using this medication? °Your condition will be monitored carefully while you are receiving this medicine. °You may need blood work done while you are taking this medicine. °Do not become pregnant while taking this medicine or for 4 months after stopping it. Women should inform their doctor if they wish to become pregnant or think they might be pregnant. There is a potential for serious side effects to an unborn child. Talk to your health care professional or  pharmacist for more information. Do not breast-feed an infant while taking this medicine or for 4 months after the last dose. °What side effects may I notice from receiving this medication? °Side effects that you should report to your doctor or health care professional as soon as possible: °allergic reactions like skin rash, itching or hives, swelling of the face, lips, or tongue °bloody or black, tarry °breathing problems °changes in vision °chest pain °chills °confusion °constipation °cough °diarrhea °dizziness or feeling faint or lightheaded °fast or irregular heartbeat °fever °flushing °joint pain °low blood counts - this medicine may decrease the number of white blood cells, red blood cells and platelets. You may be at increased risk for infections and bleeding. °muscle pain °muscle weakness °pain, tingling, numbness in the hands or feet °persistent headache °redness, blistering, peeling or loosening of the skin, including inside the mouth °signs and symptoms of high blood sugar such as dizziness; dry mouth; dry skin; fruity breath; nausea; stomach pain; increased hunger or thirst; increased urination °signs and symptoms of kidney injury like trouble passing urine or change in the amount of urine °signs and symptoms of liver injury like dark urine, light-colored stools, loss of appetite, nausea, right upper belly pain, yellowing of the eyes or skin °sweating °swollen lymph nodes °weight loss °Side effects that usually do not   require medical attention (report to your doctor or health care professional if they continue or are bothersome): °decreased appetite °hair loss °tiredness °This list may not describe all possible side effects. Call your doctor for medical advice about side effects. You may report side effects to FDA at 1-800-FDA-1088. °Where should I keep my medication? °This drug is given in a hospital or clinic and will not be stored at home. °NOTE: This sheet is a summary. It may not cover all possible  information. If you have questions about this medicine, talk to your doctor, pharmacist, or health care provider. °© 2022 [employer]vier/Gold Standard (2020-11-01 00:00:00) ° °

## 2021-01-24 NOTE — Progress Notes (Signed)
Patient presents for treatment. RN assessment completed along with the following:  Labs/vitals reviewed - Yes, and within treatment parameters.   Weight within 10% of previous measurement - Yes Oncology Treatment Attestation completed for current therapy- Yes, on date 11/11/20 Informed consent completed and reflects current therapy/intent - Yes, on date 11/30/20             Provider progress note reviewed - Today's provider note is not yet available. I reviewed the most recent oncology provider progress note in chart dated 01/04/21. Treatment/Antibody/Supportive plan reviewed - Yes, and there are no adjustments needed for today's treatment. S&H and other orders reviewed - Yes, and there are no additional orders identified. Previous treatment date reviewed - Yes, and the appropriate amount of time has elapsed between treatments.  Patient to proceed with treatment.

## 2021-01-24 NOTE — Patient Instructions (Signed)

## 2021-01-24 NOTE — Progress Notes (Signed)
Patient seen by Dr. Sherrill today ? ?Vitals are within treatment parameters. ? ?Labs reviewed by Dr. Sherrill and are within treatment parameters. ? ?Per physician team, patient is ready for treatment and there are NO modifications to the treatment plan.  ?

## 2021-02-07 ENCOUNTER — Other Ambulatory Visit: Payer: Self-pay | Admitting: Nurse Practitioner

## 2021-02-07 ENCOUNTER — Telehealth: Payer: Self-pay

## 2021-02-07 ENCOUNTER — Other Ambulatory Visit: Payer: Self-pay | Admitting: Oncology

## 2021-02-07 ENCOUNTER — Other Ambulatory Visit (HOSPITAL_COMMUNITY): Payer: Self-pay

## 2021-02-07 DIAGNOSIS — C649 Malignant neoplasm of unspecified kidney, except renal pelvis: Secondary | ICD-10-CM

## 2021-02-07 MED ORDER — OXYCODONE HCL ER 10 MG PO T12A
EXTENDED_RELEASE_TABLET | Freq: Two times a day (BID) | ORAL | 0 refills | Status: DC
  Filled 2021-02-07 – 2021-02-08 (×2): qty 120, fill #0
  Filled 2021-02-10: qty 120, 30d supply, fill #0

## 2021-02-07 MED ORDER — OXYCODONE HCL 5 MG PO TABS
5.0000 mg | ORAL_TABLET | Freq: Four times a day (QID) | ORAL | 0 refills | Status: DC | PRN
  Filled 2021-02-07 – 2021-02-08 (×2): qty 60, 15d supply, fill #0

## 2021-02-07 MED ORDER — LENVIMA (18 MG DAILY DOSE) 10 MG & 2 X 4 MG PO CPPK
18.0000 mg | ORAL_CAPSULE | Freq: Every day | ORAL | 0 refills | Status: DC
  Filled 2021-02-13: qty 90, 30d supply, fill #0

## 2021-02-07 NOTE — Telephone Encounter (Signed)
Request from the pharmacy to refill oxycontin  10 mg and oxycodone 5 mg. Refill request put on providers desk.

## 2021-02-08 ENCOUNTER — Other Ambulatory Visit (HOSPITAL_COMMUNITY): Payer: Self-pay

## 2021-02-10 ENCOUNTER — Other Ambulatory Visit (HOSPITAL_COMMUNITY): Payer: Self-pay

## 2021-02-11 ENCOUNTER — Other Ambulatory Visit: Payer: Self-pay

## 2021-02-12 ENCOUNTER — Other Ambulatory Visit: Payer: Self-pay | Admitting: Oncology

## 2021-02-13 ENCOUNTER — Other Ambulatory Visit (HOSPITAL_COMMUNITY): Payer: Self-pay

## 2021-02-14 ENCOUNTER — Other Ambulatory Visit (HOSPITAL_COMMUNITY): Payer: Self-pay

## 2021-02-14 ENCOUNTER — Inpatient Hospital Stay: Payer: PPO | Admitting: Oncology

## 2021-02-14 ENCOUNTER — Other Ambulatory Visit: Payer: Self-pay

## 2021-02-14 ENCOUNTER — Inpatient Hospital Stay: Payer: PPO

## 2021-02-14 ENCOUNTER — Encounter: Payer: Self-pay | Admitting: *Deleted

## 2021-02-14 ENCOUNTER — Telehealth: Payer: Self-pay | Admitting: *Deleted

## 2021-02-14 ENCOUNTER — Inpatient Hospital Stay: Payer: PPO | Attending: Nurse Practitioner

## 2021-02-14 VITALS — BP 146/72 | HR 62 | Temp 98.6°F | Resp 18

## 2021-02-14 VITALS — BP 148/74 | HR 61 | Temp 98.1°F | Resp 18 | Ht 70.0 in | Wt 228.6 lb

## 2021-02-14 DIAGNOSIS — C7951 Secondary malignant neoplasm of bone: Secondary | ICD-10-CM | POA: Diagnosis not present

## 2021-02-14 DIAGNOSIS — C7972 Secondary malignant neoplasm of left adrenal gland: Secondary | ICD-10-CM | POA: Insufficient documentation

## 2021-02-14 DIAGNOSIS — C642 Malignant neoplasm of left kidney, except renal pelvis: Secondary | ICD-10-CM | POA: Insufficient documentation

## 2021-02-14 DIAGNOSIS — C649 Malignant neoplasm of unspecified kidney, except renal pelvis: Secondary | ICD-10-CM | POA: Diagnosis not present

## 2021-02-14 DIAGNOSIS — C7931 Secondary malignant neoplasm of brain: Secondary | ICD-10-CM | POA: Insufficient documentation

## 2021-02-14 DIAGNOSIS — Z5112 Encounter for antineoplastic immunotherapy: Secondary | ICD-10-CM | POA: Insufficient documentation

## 2021-02-14 DIAGNOSIS — Z23 Encounter for immunization: Secondary | ICD-10-CM | POA: Insufficient documentation

## 2021-02-14 LAB — CBC WITH DIFFERENTIAL (CANCER CENTER ONLY)
Abs Immature Granulocytes: 0.03 10*3/uL (ref 0.00–0.07)
Basophils Absolute: 0 10*3/uL (ref 0.0–0.1)
Basophils Relative: 0 %
Eosinophils Absolute: 0.8 10*3/uL — ABNORMAL HIGH (ref 0.0–0.5)
Eosinophils Relative: 8 %
HCT: 38.8 % — ABNORMAL LOW (ref 39.0–52.0)
Hemoglobin: 12.2 g/dL — ABNORMAL LOW (ref 13.0–17.0)
Immature Granulocytes: 0 %
Lymphocytes Relative: 22 %
Lymphs Abs: 2.3 10*3/uL (ref 0.7–4.0)
MCH: 28.1 pg (ref 26.0–34.0)
MCHC: 31.4 g/dL (ref 30.0–36.0)
MCV: 89.4 fL (ref 80.0–100.0)
Monocytes Absolute: 0.7 10*3/uL (ref 0.1–1.0)
Monocytes Relative: 7 %
Neutro Abs: 6.8 10*3/uL (ref 1.7–7.7)
Neutrophils Relative %: 63 %
Platelet Count: 259 10*3/uL (ref 150–400)
RBC: 4.34 MIL/uL (ref 4.22–5.81)
RDW: 14.8 % (ref 11.5–15.5)
WBC Count: 10.7 10*3/uL — ABNORMAL HIGH (ref 4.0–10.5)
nRBC: 0 % (ref 0.0–0.2)

## 2021-02-14 LAB — CMP (CANCER CENTER ONLY)
ALT: 19 U/L (ref 0–44)
AST: 19 U/L (ref 15–41)
Albumin: 4 g/dL (ref 3.5–5.0)
Alkaline Phosphatase: 88 U/L (ref 38–126)
Anion gap: 8 (ref 5–15)
BUN: 21 mg/dL (ref 8–23)
CO2: 29 mmol/L (ref 22–32)
Calcium: 9.3 mg/dL (ref 8.9–10.3)
Chloride: 101 mmol/L (ref 98–111)
Creatinine: 0.82 mg/dL (ref 0.61–1.24)
GFR, Estimated: >60 mL/min (ref >60)
Glucose, Bld: 107 mg/dL — ABNORMAL HIGH (ref 70–99)
Potassium: 4.4 mmol/L (ref 3.5–5.1)
Sodium: 138 mmol/L (ref 135–145)
Total Bilirubin: 0.3 mg/dL (ref 0.3–1.2)
Total Protein: 6.6 g/dL (ref 6.5–8.1)

## 2021-02-14 LAB — TOTAL PROTEIN, URINE DIPSTICK: Protein, ur: NEGATIVE mg/dL

## 2021-02-14 MED ORDER — PREDNISONE 50 MG PO TABS
ORAL_TABLET | ORAL | 0 refills | Status: DC
  Filled 2021-02-14: qty 3, 1d supply, fill #0

## 2021-02-14 MED ORDER — INFLUENZA VAC A&B SA ADJ QUAD 0.5 ML IM PRSY
0.5000 mL | PREFILLED_SYRINGE | Freq: Once | INTRAMUSCULAR | Status: AC
  Administered 2021-02-14: 13:00:00 0.5 mL via INTRAMUSCULAR
  Filled 2021-02-14: qty 0.5

## 2021-02-14 MED ORDER — SODIUM CHLORIDE 0.9 % IV SOLN: Freq: Once | INTRAVENOUS | Status: AC

## 2021-02-14 MED ORDER — SODIUM CHLORIDE 0.9% FLUSH
10.0000 mL | INTRAVENOUS | Status: DC | PRN
  Administered 2021-02-14: 14:00:00 10 mL

## 2021-02-14 MED ORDER — SODIUM CHLORIDE 0.9 % IV SOLN
200.0000 mg | Freq: Once | INTRAVENOUS | Status: AC
  Administered 2021-02-14: 14:00:00 200 mg via INTRAVENOUS
  Filled 2021-02-14: qty 8

## 2021-02-14 MED ORDER — HEPARIN SOD (PORK) LOCK FLUSH 100 UNIT/ML IV SOLN
500.0000 [IU] | Freq: Once | INTRAVENOUS | Status: AC | PRN
  Administered 2021-02-14: 14:00:00 500 [IU]

## 2021-02-14 NOTE — Progress Notes (Signed)
Patient presents for treatment. RN assessment completed along with the following:  Labs/vitals reviewed - Yes, and within treatment parameters.   Weight within 10% of previous measurement - Yes Oncology Treatment Attestation completed for current therapy- Yes, on date 11/11/2020 Informed consent completed and reflects current therapy/intent - Yes, on date 11/30/2020             Provider progress note reviewed - Yes, today's provider note was reviewed. Treatment/Antibody/Supportive plan reviewed - Yes, and there are no adjustments needed for today's treatment. S&H and other orders reviewed - Yes, and there are no additional orders identified. Previous treatment date reviewed - Yes, and the appropriate amount of time has elapsed between treatments. Clinic Hand Off Received from - Yes from [place], [place].  Patient to proceed with treatment.

## 2021-02-14 NOTE — Patient Instructions (Signed)
Influenza Virus Vaccine injection °What is this medication? °INFLUENZA VIRUS VACCINE (in floo EN zuh VAHY ruhs vak SEEN) helps to reduce the risk of getting influenza also known as the flu. The vaccine only helps protect you against some strains of the flu. °This medicine may be used for other purposes; ask your health care provider or pharmacist if you have questions. °COMMON BRAND NAME(S): Afluria, Afluria Quadrivalent, Agriflu, Alfuria, FLUAD, FLUAD Quadrivalent, Fluarix, Fluarix Quadrivalent, Flublok, Flublok Quadrivalent, FLUCELVAX, FLUCELVAX Quadrivalent, Flulaval, Flulaval Quadrivalent, Fluvirin, Fluzone, Fluzone High-Dose, Fluzone Intradermal, Fluzone Quadrivalent °What should I tell my care team before I take this medication? °They need to know if you have any of these conditions: °bleeding disorder like hemophilia °fever or infection °Guillain-Barre syndrome or other neurological problems °immune system problems °infection with the human immunodeficiency virus (HIV) or AIDS °low blood platelet counts °multiple sclerosis °an unusual or allergic reaction to influenza virus vaccine, latex, other medicines, foods, dyes, or preservatives. Different brands of vaccines contain different allergens. Some may contain latex or eggs. Talk to your doctor about your allergies to make sure that you get the right vaccine. °pregnant or trying to get pregnant °breast-feeding °How should I use this medication? °This vaccine is for injection into a muscle or under the skin. It is given by a health care professional. °A copy of Vaccine Information Statements will be given before each vaccination. Read this sheet carefully each time. The sheet may change frequently. °Talk to your healthcare provider to see which vaccines are right for you. Some vaccines should not be used in all age groups. °Overdosage: If you think you have taken too much of this medicine contact a poison control center or emergency room at once. °NOTE: This  medicine is only for you. Do not share this medicine with others. °What if I miss a dose? °This does not apply. °What may interact with this medication? °chemotherapy or radiation therapy °medicines that lower your immune system like etanercept, anakinra, infliximab, and adalimumab °medicines that treat or prevent blood clots like warfarin °phenytoin °steroid medicines like prednisone or cortisone °theophylline °vaccines °This list may not describe all possible interactions. Give your health care provider a list of all the medicines, herbs, non-prescription drugs, or dietary supplements you use. Also tell them if you smoke, drink alcohol, or use illegal drugs. Some items may interact with your medicine. °What should I watch for while using this medication? °Report any side effects that do not go away within 3 days to your doctor or health care professional. Call your health care provider if any unusual symptoms occur within 6 weeks of receiving this vaccine. °You may still catch the flu, but the illness is not usually as bad. You cannot get the flu from the vaccine. The vaccine will not protect against colds or other illnesses that may cause fever. The vaccine is needed every year. °What side effects may I notice from receiving this medication? °Side effects that you should report to your doctor or health care professional as soon as possible: °allergic reactions like skin rash, itching or hives, swelling of the face, lips, or tongue °Side effects that usually do not require medical attention (report to your doctor or health care professional if they continue or are bothersome): °fever °headache °muscle aches and pains °pain, tenderness, redness, or swelling at the injection site °tiredness °This list may not describe all possible side effects. Call your doctor for medical advice about side effects. You may report side effects to FDA at 1-800-FDA-1088. °  Where should I keep my medication? °The vaccine will be given  by a health care professional in a clinic, pharmacy, doctor's office, or other health care setting. You will not be given vaccine doses to store at home. °NOTE: This sheet is a summary. It may not cover all possible information. If you have questions about this medicine, talk to your doctor, pharmacist, or health care provider. °© 2022 [employer]vier/Gold Standard (2020-11-01 00:00:00) ° °

## 2021-02-14 NOTE — Telephone Encounter (Signed)
CT scan at [facility] radiology on 03/03/20 at 11:00 w/arrival at 10:45. Nothing to eat or drink after 7 am and drink oral contrast at 0900 and 1000. Remember Prednisone 50 mg 13 hours, 7 hours and 1 hour prior to scan and this has been called in. Also will need to take Benadryl 50 mg 1 hour prior to scan. Left this information on voice mail and sent via MyChart as well.

## 2021-02-14 NOTE — Progress Notes (Signed)
[facility] OFFICE PROGRESS NOTE   Diagnosis: Renal cell carcinoma  INTERVAL HISTORY:   Mr Zorn returns as scheduled.  He continues lenvatinib.  No rash or diarrhea.  He takes Zofran every 8 hours for nausea.  No emesis.  Stable pain.  He was last treated with pembrolizumab on 01/24/2021.  Objective:  Vital signs in last 24 hours:  Blood pressure (!) 148/74, pulse 61, temperature 98.1 F (36.7 C), temperature source Oral, resp. rate 18, height 5\' 10"  (1.778 m), weight 228 lb 9.6 oz (103.7 kg), SpO2 100 %.    HEENT: No thrush or ulcers Resp: Lungs clear bilaterally Cardio: Regular rate and rhythm GI: No hepatomegaly, nontender Vascular: No leg edema  Skin: No rash  Portacath/PICC-without erythema  Lab Results:  Lab Results  Component Value Date   WBC 10.7 (H) 02/14/2021   HGB 12.2 (L) 02/14/2021   HCT 38.8 (L) 02/14/2021   MCV 89.4 02/14/2021   PLT 259 02/14/2021   NEUTROABS 6.8 02/14/2021    CMP  Lab Results  Component Value Date   NA 137 01/24/2021   K 4.3 01/24/2021   CL 101 01/24/2021   CO2 29 01/24/2021   GLUCOSE 102 (H) 01/24/2021   BUN 18 01/24/2021   CREATININE 0.83 01/24/2021   CALCIUM 9.4 01/24/2021   PROT 6.5 01/24/2021   ALBUMIN 4.1 01/24/2021   AST 29 01/24/2021   ALT 33 01/24/2021   ALKPHOS 86 01/24/2021   BILITOT 0.4 01/24/2021   GFRNONAA >60 01/24/2021   GFRAA >60 11/05/2019    Lab Results  Component Value Date   CEA1 1.77 01/04/2016     Medications: I have reviewed the patient's current medications.   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, L4 nerve compression Biopsy of the L4 mass 11/18/2015 confirmed metastatic renal cell carcinoma, clear cell type CTs of the chest, abdomen, and pelvis 11/18/2015-right lower lobe nodule, expansile lytic lesion at the right 11th rib/costal vertebral junction, left renal mass, expansile lesion involving the L4 vertebra, lytic  lesion at the left acetabulum, and a low-attenuation liver lesion Initiation of SRS to L4 12/02/2015, Completed 12/12/2015 Initiation of Pazopanib 12/30/2015 Pazopanib placed on hold 02/06/2016 secondary to elevated liver enzymes Pazopanib resumed 03/07/2016 at a dose of 400 mg daily  Pazopanib discontinued 03/19/2016 secondary to elevated liver enzymes Restaging CTs 04/02/2016-stable left renal mass, decreased soft tissue component associated with the L4 metastasis, increased soft tissue component associated with the right 11th rib metastasis with increased T11 bony destruction, increased sclerosis at the left acetabulum lesion Cycle 1 nivolumab 04/12/2016 Cycle 2 nivolumab 04/26/2016 Cycle 3 nivolumab 05/11/2016 Cycle 4 nivolumab 05/24/2016 Cycle 5 nivolumab 06/08/2016 MRI lumbar spine 06/21/2016-unchanged tumor at L3, increased size of retroperitoneal lymph nodes compared to a CT from 04/02/2016 Cycle 6 nivolumab  06/22/2016 CTs chest, abdomen, and pelvis 07/04/2016-enlargement of the left renal mass, right adrenal nodule, left hilar and peritoneal lymph nodes, enlargement of left acetabular lesion. Stable lung nodules. Cycle 7 nivolumab 07/06/2016 Cycle 8 nivolumab 07/20/2016 Cycle 9 nivolumab 08/02/2016 Cycle 10 nivolumab 08/20/2016 Restaging CT 09/03/2016 evaluation with stable disease Cycle 11 nivolumab 09/05/2016 Cycle 12 nivolumab 09/19/2016 Cycle 13 nivolumab 10/03/2016 Cycle 14 nivolumab 10/17/2016 Cycle 15 nivolumab 10/31/2016 Cycle 16 nivolumab 11/14/2016 (changed to monthly schedule) Cycle 17 nivolumab 12/19/2016 CTs 01/21/2017-increased left renal mass, increased size of adrenal metastases, increased lytic bone lesions, increased left lung nodule, persistent tumor at L4 with probable epidural component Initiation of Cabozantinib 01/28/2017 Restaging CTs  05/30/2017- decreased size of left hilar mass, left renal mass, retroperitoneal adenopathy, and adrenal metastasis.  Healing  bone lesions. Cabozantinib continued CTs 10/21/2017- interval enlargement left hilar lymph node; stable rib lesions; stable mass left renal cortex; stable mildly nodular adrenal glands; stable lytic lesions within the pelvis and spine. Cabozantinib continued CTs 02/21/2018- enlargement of an AP window lymph node.  Mildly enlarged left hilar lymph node is unchanged.  Primary renal cell carcinoma involving the upper pole of the left kidney appears similar.  Stable enlarged right periaortic lymph node adjacent to the renal vessels.  Stable multifocal bony metastatic disease. Cabozantinib continued CTs 07/29/2018- stable 15 mm AP window nodes; stable left hilar node; subcarinal node slightly larger; right periaortic node 16 mm, previously 12 mm; portacaval node 24 mm, previously 16 mm; 15 mm node superior to the pancreatic head has enlarged; interval increase nodularity of both adrenal glands; stable left kidney mass; multifocal bony metastatic disease not significantly changed. Cabozantinib continued CTs 11/06/2018- moderate improvement in thoracic adenopathy; minimal improvement abdominal adenopathy; left kidney upper pole mass and various lytic expansile bone lesions stable; mild increase in nodularity of left adrenal gland; right adrenal gland nodularity stable. 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  Peripheral enhancing masses consistent with metastases. No hemorrhage. MRI brain 03/11/2019-7 enhancing brain masses consistent with metastatic disease SRS to 7 brain lesions, treatment given 03/19/2019, 03/23/2019, and 03/25/2019 CTs 04/09/2019-decrease in left renal mass, bilateral adrenal nodules, AP window and porta hepatic adenopathy.  New 9 mm right lower lobe nodule.  Improved lytic lesion of the left acetabulum.  Other bone lesions are stable. Cabozantinib continued MRI brain  07/17/2019-resolution of 4 mm treated lesion in the right frontal cortex, 6 remaining treated lesions have decreased in size, new punctate metastasis in the superior right cerebellum SRS to right cerebellar lesion 07/30/2019 CTs 08/17/2019-previously noted right lower lobe nodule resolved, stable left kidney mass, mixed lytic/sclerotic bone lesions in the thoracolumbar spine, right posterior ribs, and left acetabulum-unchanged, no evidence of progressive disease  Cabozantinib continued MRI brain 10/23/2019-stable to slight decrease in size of multiple enhancing intracranial lesions.  Slight increase in surrounding edema in the medial left frontal lobe.  No new lesions present. MRI brain 01/29/2020-multiple enhancing brain lesions, some hemorrhagic, some with mild enlargement-treatment effect? CTs 02/08/2020-no thoracic metastases, enlargement of the left renal mass, enlargement of lytic lesion at T9, other lytic lesions unchanged MRI brain 05/04/2020-no new lesions, slight enlargement of a right occipital lesion, other lesions are stable or decreased in size CTs 08/11/2020-enlargement of left kidney mass, new 1.6 cm segment 7 liver lesion, enlargement of a lytic lesion at T9, increased lysis of a sclerotic lesion at the right second rib, 0.7 cm endoluminal nodule at the bladder dome-enlarged Brain MRI 08/10/2020-slight increase in a 15 mm left frontal lobe lesion, new punctate focus in the right occipital cortex, other lesions stable or slightly decreased CTs 11/01/2020-increase in left suprahilar opacity, enlargement of previous liver metastases, several new subcentimeter lesions, increase in left renal mass, similar appearance of bone metastases, stable hyperdense/enhancing nodule in the left bladder Brain MRI 11/04/2020-no change in brain metastases Lenvatinib/pembrolizumab 11/30/2020 Pain secondary to #1-managed by Dr. Lovenia Shuck.  Improved Hypertension Elevated transaminases 02/06/2016- Pazopanib placed on  hold Liver enzymes normal 03/07/2016 Port-A-Cath placement 05/16/2016 Malaise/anorexia 09/05/2016. Cortisol and testosterone levels low. Hydrocortisone and testosterone replacement initiated. Conjunctival/scleral erythema 09/19/2016-resolved with steroid eyedrops Proximal right leg weakness. Likely related to  chronic nerve damage from the destructive process at L4. Hypercalcemia status post Zometa 01/23/2017-resolved Pruritic rash following IV contrast 10/21/2017; rash following IV contrast 02/21/2018 despite prednisone/Benadryl premedication Brief episodes of expressive aphasia October and December 2020      Disposition: Mr Puebla appears stable.  He will continue lenvatinib.  He will complete another treatment pembrolizumab today.  He will undergo restaging CTs prior to an office visit 03/07/2020.  Betsy Coder, MD  02/14/2021  12:14 PM

## 2021-02-14 NOTE — Patient Instructions (Signed)
[facility]   °Discharge Instructions: °Thank you for choosing [facility] to provide your oncology and hematology care.  ° °If you have a lab appointment with the Cancer Center, please go directly to the Cancer Center and check in at the registration area. °  °Wear comfortable clothing and clothing appropriate for easy access to any Portacath or PICC line.  ° °We strive to give you quality time with your provider. You may need to reschedule your appointment if you arrive late (15 or more minutes).  Arriving late affects you and other patients whose appointments are after yours.  Also, if you miss three or more appointments without notifying the office, you may be dismissed from the clinic at the provider’s discretion.    °  °For prescription refill requests, have your pharmacy contact our office and allow 72 hours for refills to be completed.   ° °Today you received the following chemotherapy and/or immunotherapy agents Pembrolizumab (KEYTRUDA).    °  °To help prevent nausea and vomiting after your treatment, we encourage you to take your nausea medication as directed. ° °BELOW ARE SYMPTOMS THAT SHOULD BE REPORTED IMMEDIATELY: °*FEVER GREATER THAN 100.4 F (38 °C) OR HIGHER °*CHILLS OR SWEATING °*NAUSEA AND VOMITING THAT IS NOT CONTROLLED WITH YOUR NAUSEA MEDICATION °*UNUSUAL SHORTNESS OF BREATH °*UNUSUAL BRUISING OR BLEEDING °*URINARY PROBLEMS (pain or burning when urinating, or frequent urination) °*BOWEL PROBLEMS (unusual diarrhea, constipation, pain near the anus) °TENDERNESS IN MOUTH AND THROAT WITH OR WITHOUT PRESENCE OF ULCERS (sore throat, sores in mouth, or a toothache) °UNUSUAL RASH, SWELLING OR PAIN  °UNUSUAL VAGINAL DISCHARGE OR ITCHING  ° °Items with * indicate a potential emergency and should be followed up as soon as possible or go to the Emergency Department if any problems should occur. ° °Please show the CHEMOTHERAPY ALERT CARD or IMMUNOTHERAPY ALERT CARD  at check-in to the Emergency Department and triage nurse. ° °Should you have questions after your visit or need to cancel or reschedule your appointment, please contact [facility]  Dept: [phone]  and follow the prompts.  Office hours are 8:00 a.m. to 4:30 p.m. Monday - Friday. Please note that voicemails left after 4:00 p.m. may not be returned until the following business day.  We are closed weekends and major holidays. You have access to a nurse at all times for urgent questions. Please call the main number to the clinic Dept: [phone] and follow the prompts. ° ° °For any non-urgent questions, you may also contact your provider using MyChart. We now offer e-Visits for anyone 18 and older to request care online for non-urgent symptoms. For details visit [email or web address]. °  °Also download the MyChart app! Go to the app store, search "MyChart", open the app, select [facility], and log in with your MyChart username and password. ° °Due to Covid, a mask is required upon entering the hospital/clinic. If you do not have a mask, one will be given to you upon arrival. For doctor visits, patients may have 1 support person aged 18 or older with them. For treatment visits, patients cannot have anyone with them due to current Covid guidelines and our immunocompromised population.  ° °Pembrolizumab injection °What is this medication? °PEMBROLIZUMAB (pem broe liz ue mab) is a monoclonal antibody. It is used to treat certain types of cancer. °This medicine may be used for other purposes; ask your health care provider or pharmacist if you have questions. °COMMON BRAND NAME(S):   Keytruda °What should I tell my care team before I take this medication? °They need to know if you have any of these conditions: °autoimmune diseases like Crohn's disease, ulcerative colitis, or lupus °have had or planning to have an allogeneic stem cell transplant (uses someone else's stem cells) °history of  organ transplant °history of chest radiation °nervous system problems like myasthenia gravis or Guillain-Barre syndrome °an unusual or allergic reaction to pembrolizumab, other medicines, foods, dyes, or preservatives °pregnant or trying to get pregnant °breast-feeding °How should I use this medication? °This medicine is for infusion into a vein. It is given by a health care professional in a hospital or clinic setting. °A special MedGuide will be given to you before each treatment. Be sure to read this information carefully each time. °Talk to your pediatrician regarding the use of this medicine in children. While this drug may be prescribed for children as young as 6 months for selected conditions, precautions do apply. °Overdosage: If you think you have taken too much of this medicine contact a poison control center or emergency room at once. °NOTE: This medicine is only for you. Do not share this medicine with others. °What if I miss a dose? °It is important not to miss your dose. Call your doctor or health care professional if you are unable to keep an appointment. °What may interact with this medication? °Interactions have not been studied. °This list may not describe all possible interactions. Give your health care provider a list of all the medicines, herbs, non-prescription drugs, or dietary supplements you use. Also tell them if you smoke, drink alcohol, or use illegal drugs. Some items may interact with your medicine. °What should I watch for while using this medication? °Your condition will be monitored carefully while you are receiving this medicine. °You may need blood work done while you are taking this medicine. °Do not become pregnant while taking this medicine or for 4 months after stopping it. Women should inform their doctor if they wish to become pregnant or think they might be pregnant. There is a potential for serious side effects to an unborn child. Talk to your health care professional or  pharmacist for more information. Do not breast-feed an infant while taking this medicine or for 4 months after the last dose. °What side effects may I notice from receiving this medication? °Side effects that you should report to your doctor or health care professional as soon as possible: °allergic reactions like skin rash, itching or hives, swelling of the face, lips, or tongue °bloody or black, tarry °breathing problems °changes in vision °chest pain °chills °confusion °constipation °cough °diarrhea °dizziness or feeling faint or lightheaded °fast or irregular heartbeat °fever °flushing °joint pain °low blood counts - this medicine may decrease the number of white blood cells, red blood cells and platelets. You may be at increased risk for infections and bleeding. °muscle pain °muscle weakness °pain, tingling, numbness in the hands or feet °persistent headache °redness, blistering, peeling or loosening of the skin, including inside the mouth °signs and symptoms of high blood sugar such as dizziness; dry mouth; dry skin; fruity breath; nausea; stomach pain; increased hunger or thirst; increased urination °signs and symptoms of kidney injury like trouble passing urine or change in the amount of urine °signs and symptoms of liver injury like dark urine, light-colored stools, loss of appetite, nausea, right upper belly pain, yellowing of the eyes or skin °sweating °swollen lymph nodes °weight loss °Side effects that usually do not   require medical attention (report to your doctor or health care professional if they continue or are bothersome): °decreased appetite °hair loss °tiredness °This list may not describe all possible side effects. Call your doctor for medical advice about side effects. You may report side effects to FDA at 1-800-FDA-1088. °Where should I keep my medication? °This drug is given in a hospital or clinic and will not be stored at home. °NOTE: This sheet is a summary. It may not cover all possible  information. If you have questions about this medicine, talk to your doctor, pharmacist, or health care provider. °© 2022 [employer] (2020-11-01 00:00:00) ° °

## 2021-02-21 ENCOUNTER — Other Ambulatory Visit (HOSPITAL_COMMUNITY): Payer: Self-pay

## 2021-02-21 ENCOUNTER — Other Ambulatory Visit: Payer: Self-pay | Admitting: Radiation Therapy

## 2021-02-26 HISTORY — PX: OTHER SURGICAL HISTORY: SHX169

## 2021-02-28 DIAGNOSIS — R269 Unspecified abnormalities of gait and mobility: Secondary | ICD-10-CM | POA: Diagnosis not present

## 2021-02-28 DIAGNOSIS — M5416 Radiculopathy, lumbar region: Secondary | ICD-10-CM | POA: Diagnosis not present

## 2021-02-28 DIAGNOSIS — I10 Essential (primary) hypertension: Secondary | ICD-10-CM | POA: Diagnosis not present

## 2021-02-28 DIAGNOSIS — E785 Hyperlipidemia, unspecified: Secondary | ICD-10-CM | POA: Diagnosis not present

## 2021-02-28 DIAGNOSIS — C649 Malignant neoplasm of unspecified kidney, except renal pelvis: Secondary | ICD-10-CM | POA: Diagnosis not present

## 2021-02-28 DIAGNOSIS — E291 Testicular hypofunction: Secondary | ICD-10-CM | POA: Diagnosis not present

## 2021-02-28 DIAGNOSIS — E559 Vitamin D deficiency, unspecified: Secondary | ICD-10-CM | POA: Diagnosis not present

## 2021-02-28 DIAGNOSIS — Z1389 Encounter for screening for other disorder: Secondary | ICD-10-CM | POA: Diagnosis not present

## 2021-02-28 DIAGNOSIS — Z0001 Encounter for general adult medical examination with abnormal findings: Secondary | ICD-10-CM | POA: Diagnosis not present

## 2021-02-28 DIAGNOSIS — E669 Obesity, unspecified: Secondary | ICD-10-CM | POA: Diagnosis not present

## 2021-03-02 ENCOUNTER — Other Ambulatory Visit: Payer: Self-pay | Admitting: *Deleted

## 2021-03-02 DIAGNOSIS — Z95828 Presence of other vascular implants and grafts: Secondary | ICD-10-CM

## 2021-03-03 ENCOUNTER — Inpatient Hospital Stay: Payer: PPO

## 2021-03-03 ENCOUNTER — Other Ambulatory Visit: Payer: Self-pay

## 2021-03-03 ENCOUNTER — Inpatient Hospital Stay: Payer: PPO | Attending: Nurse Practitioner

## 2021-03-03 ENCOUNTER — Ambulatory Visit (HOSPITAL_BASED_OUTPATIENT_CLINIC_OR_DEPARTMENT_OTHER)
Admission: RE | Admit: 2021-03-03 | Discharge: 2021-03-03 | Disposition: A | Discharge status: Home / Self Care | Payer: PPO | Source: Ambulatory Visit | Attending: Oncology | Admitting: Oncology

## 2021-03-03 DIAGNOSIS — C7951 Secondary malignant neoplasm of bone: Secondary | ICD-10-CM | POA: Insufficient documentation

## 2021-03-03 DIAGNOSIS — R5381 Other malaise: Secondary | ICD-10-CM | POA: Insufficient documentation

## 2021-03-03 DIAGNOSIS — R400 Somnolence: Secondary | ICD-10-CM | POA: Insufficient documentation

## 2021-03-03 DIAGNOSIS — I129 Hypertensive chronic kidney disease with stage 1 through stage 4 chronic kidney disease, or unspecified chronic kidney disease: Secondary | ICD-10-CM | POA: Insufficient documentation

## 2021-03-03 DIAGNOSIS — E538 Deficiency of other specified B group vitamins: Secondary | ICD-10-CM | POA: Insufficient documentation

## 2021-03-03 DIAGNOSIS — I700 Atherosclerosis of aorta: Secondary | ICD-10-CM | POA: Diagnosis not present

## 2021-03-03 DIAGNOSIS — Z5112 Encounter for antineoplastic immunotherapy: Secondary | ICD-10-CM | POA: Insufficient documentation

## 2021-03-03 DIAGNOSIS — C787 Secondary malignant neoplasm of liver and intrahepatic bile duct: Secondary | ICD-10-CM | POA: Insufficient documentation

## 2021-03-03 DIAGNOSIS — R590 Localized enlarged lymph nodes: Secondary | ICD-10-CM | POA: Diagnosis not present

## 2021-03-03 DIAGNOSIS — Z807 Family history of other malignant neoplasms of lymphoid, hematopoietic and related tissues: Secondary | ICD-10-CM | POA: Insufficient documentation

## 2021-03-03 DIAGNOSIS — C7931 Secondary malignant neoplasm of brain: Secondary | ICD-10-CM | POA: Insufficient documentation

## 2021-03-03 DIAGNOSIS — N3289 Other specified disorders of bladder: Secondary | ICD-10-CM | POA: Diagnosis not present

## 2021-03-03 DIAGNOSIS — C649 Malignant neoplasm of unspecified kidney, except renal pelvis: Secondary | ICD-10-CM | POA: Diagnosis not present

## 2021-03-03 DIAGNOSIS — C7972 Secondary malignant neoplasm of left adrenal gland: Secondary | ICD-10-CM | POA: Insufficient documentation

## 2021-03-03 DIAGNOSIS — R21 Rash and other nonspecific skin eruption: Secondary | ICD-10-CM | POA: Insufficient documentation

## 2021-03-03 DIAGNOSIS — R918 Other nonspecific abnormal finding of lung field: Secondary | ICD-10-CM | POA: Diagnosis not present

## 2021-03-03 DIAGNOSIS — C642 Malignant neoplasm of left kidney, except renal pelvis: Secondary | ICD-10-CM | POA: Insufficient documentation

## 2021-03-03 DIAGNOSIS — N189 Chronic kidney disease, unspecified: Secondary | ICD-10-CM | POA: Insufficient documentation

## 2021-03-03 DIAGNOSIS — K8020 Calculus of gallbladder without cholecystitis without obstruction: Secondary | ICD-10-CM | POA: Diagnosis not present

## 2021-03-03 LAB — CMP (CANCER CENTER ONLY)
ALT: 28 U/L (ref 0–44)
AST: 23 U/L (ref 15–41)
Albumin: 4.1 g/dL (ref 3.5–5.0)
Alkaline Phosphatase: 89 U/L (ref 38–126)
Anion gap: 10 (ref 5–15)
BUN: 16 mg/dL (ref 8–23)
CO2: 27 mmol/L (ref 22–32)
Calcium: 9.3 mg/dL (ref 8.9–10.3)
Chloride: 100 mmol/L (ref 98–111)
Creatinine: 0.81 mg/dL (ref 0.61–1.24)
GFR, Estimated: >60 mL/min (ref >60)
Glucose, Bld: 164 mg/dL — ABNORMAL HIGH (ref 70–99)
Potassium: 4.4 mmol/L (ref 3.5–5.1)
Sodium: 137 mmol/L (ref 135–145)
Total Bilirubin: 0.3 mg/dL (ref 0.3–1.2)
Total Protein: 7.2 g/dL (ref 6.5–8.1)

## 2021-03-03 LAB — CBC WITH DIFFERENTIAL (CANCER CENTER ONLY)
Abs Immature Granulocytes: 0.03 10*3/uL (ref 0.00–0.07)
Basophils Absolute: 0 10*3/uL (ref 0.0–0.1)
Basophils Relative: 0 %
Eosinophils Absolute: 0 10*3/uL (ref 0.0–0.5)
Eosinophils Relative: 0 %
HCT: 38.3 % — ABNORMAL LOW (ref 39.0–52.0)
Hemoglobin: 12.1 g/dL — ABNORMAL LOW (ref 13.0–17.0)
Immature Granulocytes: 1 %
Lymphocytes Relative: 19 %
Lymphs Abs: 1.2 10*3/uL (ref 0.7–4.0)
MCH: 27.9 pg (ref 26.0–34.0)
MCHC: 31.6 g/dL (ref 30.0–36.0)
MCV: 88.5 fL (ref 80.0–100.0)
Monocytes Absolute: 0.1 10*3/uL (ref 0.1–1.0)
Monocytes Relative: 1 %
Neutro Abs: 5.1 10*3/uL (ref 1.7–7.7)
Neutrophils Relative %: 79 %
Platelet Count: 268 10*3/uL (ref 150–400)
RBC: 4.33 MIL/uL (ref 4.22–5.81)
RDW: 14.5 % (ref 11.5–15.5)
WBC Count: 6.4 10*3/uL (ref 4.0–10.5)
nRBC: 0 % (ref 0.0–0.2)

## 2021-03-03 LAB — TSH: TSH: 0.554 u[IU]/mL (ref 0.350–4.500)

## 2021-03-03 MED ORDER — IOHEXOL 300 MG/ML  SOLN
100.0000 mL | Freq: Once | INTRAMUSCULAR | Status: AC | PRN
  Administered 2021-03-03: 100 mL via INTRAVENOUS

## 2021-03-05 ENCOUNTER — Other Ambulatory Visit: Payer: Self-pay | Admitting: Oncology

## 2021-03-06 ENCOUNTER — Other Ambulatory Visit: Payer: Self-pay | Admitting: Nurse Practitioner

## 2021-03-06 ENCOUNTER — Other Ambulatory Visit (HOSPITAL_COMMUNITY): Payer: Self-pay

## 2021-03-06 ENCOUNTER — Encounter: Payer: Self-pay | Admitting: Oncology

## 2021-03-06 DIAGNOSIS — C649 Malignant neoplasm of unspecified kidney, except renal pelvis: Secondary | ICD-10-CM

## 2021-03-06 MED ORDER — OXYCODONE HCL ER 10 MG PO T12A
EXTENDED_RELEASE_TABLET | Freq: Two times a day (BID) | ORAL | 0 refills | Status: DC
  Filled 2021-03-06 – 2021-03-14 (×2): qty 120, 30d supply, fill #0

## 2021-03-06 MED ORDER — OXYCODONE HCL 5 MG PO TABS
5.0000 mg | ORAL_TABLET | Freq: Four times a day (QID) | ORAL | 0 refills | Status: DC | PRN
  Filled 2021-03-06 – 2021-03-14 (×2): qty 60, 15d supply, fill #0

## 2021-03-06 NOTE — Telephone Encounter (Signed)
Prescription given to provider

## 2021-03-07 ENCOUNTER — Other Ambulatory Visit: Payer: Self-pay

## 2021-03-07 ENCOUNTER — Encounter: Payer: Self-pay | Admitting: Nurse Practitioner

## 2021-03-07 ENCOUNTER — Inpatient Hospital Stay: Payer: PPO

## 2021-03-07 ENCOUNTER — Encounter: Payer: Self-pay | Admitting: *Deleted

## 2021-03-07 ENCOUNTER — Other Ambulatory Visit: Payer: PPO

## 2021-03-07 ENCOUNTER — Other Ambulatory Visit (HOSPITAL_BASED_OUTPATIENT_CLINIC_OR_DEPARTMENT_OTHER): Payer: Self-pay

## 2021-03-07 ENCOUNTER — Ambulatory Visit: Payer: PPO | Attending: Internal Medicine

## 2021-03-07 ENCOUNTER — Inpatient Hospital Stay: Payer: PPO | Admitting: Nurse Practitioner

## 2021-03-07 ENCOUNTER — Other Ambulatory Visit (HOSPITAL_COMMUNITY): Payer: Self-pay

## 2021-03-07 ENCOUNTER — Encounter: Payer: Self-pay | Admitting: Oncology

## 2021-03-07 VITALS — BP 157/59 | HR 52 | Temp 97.5°F | Resp 18 | Wt 229.8 lb

## 2021-03-07 VITALS — BP 158/73 | HR 50 | Temp 98.4°F | Resp 18

## 2021-03-07 DIAGNOSIS — C649 Malignant neoplasm of unspecified kidney, except renal pelvis: Secondary | ICD-10-CM

## 2021-03-07 DIAGNOSIS — Z807 Family history of other malignant neoplasms of lymphoid, hematopoietic and related tissues: Secondary | ICD-10-CM | POA: Diagnosis not present

## 2021-03-07 DIAGNOSIS — I129 Hypertensive chronic kidney disease with stage 1 through stage 4 chronic kidney disease, or unspecified chronic kidney disease: Secondary | ICD-10-CM | POA: Diagnosis not present

## 2021-03-07 DIAGNOSIS — C7931 Secondary malignant neoplasm of brain: Secondary | ICD-10-CM | POA: Diagnosis not present

## 2021-03-07 DIAGNOSIS — C7951 Secondary malignant neoplasm of bone: Secondary | ICD-10-CM | POA: Diagnosis not present

## 2021-03-07 DIAGNOSIS — N189 Chronic kidney disease, unspecified: Secondary | ICD-10-CM | POA: Diagnosis not present

## 2021-03-07 DIAGNOSIS — E538 Deficiency of other specified B group vitamins: Secondary | ICD-10-CM | POA: Diagnosis not present

## 2021-03-07 DIAGNOSIS — C787 Secondary malignant neoplasm of liver and intrahepatic bile duct: Secondary | ICD-10-CM | POA: Diagnosis not present

## 2021-03-07 DIAGNOSIS — C642 Malignant neoplasm of left kidney, except renal pelvis: Secondary | ICD-10-CM | POA: Diagnosis not present

## 2021-03-07 DIAGNOSIS — C7972 Secondary malignant neoplasm of left adrenal gland: Secondary | ICD-10-CM | POA: Diagnosis not present

## 2021-03-07 DIAGNOSIS — Z23 Encounter for immunization: Secondary | ICD-10-CM

## 2021-03-07 DIAGNOSIS — R5381 Other malaise: Secondary | ICD-10-CM | POA: Diagnosis not present

## 2021-03-07 DIAGNOSIS — R21 Rash and other nonspecific skin eruption: Secondary | ICD-10-CM | POA: Diagnosis not present

## 2021-03-07 DIAGNOSIS — Z5112 Encounter for antineoplastic immunotherapy: Secondary | ICD-10-CM | POA: Diagnosis not present

## 2021-03-07 DIAGNOSIS — R400 Somnolence: Secondary | ICD-10-CM | POA: Diagnosis not present

## 2021-03-07 LAB — VITAMIN D 25 HYDROXY (VIT D DEFICIENCY, FRACTURES): Vit D, 25-Hydroxy: 16.48 ng/mL — ABNORMAL LOW (ref 30–100)

## 2021-03-07 LAB — VITAMIN B12: Vitamin B-12: 159 pg/mL — ABNORMAL LOW (ref 180–914)

## 2021-03-07 MED ORDER — SODIUM CHLORIDE 0.9 % IV SOLN
200.0000 mg | Freq: Once | INTRAVENOUS | Status: AC
  Administered 2021-03-07: 200 mg via INTRAVENOUS
  Filled 2021-03-07: qty 8

## 2021-03-07 MED ORDER — SODIUM CHLORIDE 0.9 % IV SOLN: Freq: Once | INTRAVENOUS | Status: AC

## 2021-03-07 MED ORDER — SODIUM CHLORIDE 0.9% FLUSH
10.0000 mL | INTRAVENOUS | Status: DC | PRN
  Administered 2021-03-07: 10 mL

## 2021-03-07 MED ORDER — PFIZER COVID-19 VAC BIVALENT 30 MCG/0.3ML IM SUSP
INTRAMUSCULAR | 0 refills | Status: DC
  Filled 2021-03-07: qty 0.3, 1d supply, fill #0

## 2021-03-07 MED ORDER — HEPARIN SOD (PORK) LOCK FLUSH 100 UNIT/ML IV SOLN
500.0000 [IU] | Freq: Once | INTRAVENOUS | Status: AC | PRN
  Administered 2021-03-07: 500 [IU]

## 2021-03-07 NOTE — Patient Instructions (Signed)
[facility]   °Discharge Instructions: °Thank you for choosing [facility] to provide your oncology and hematology care.  ° °If you have a lab appointment with the Cancer Center, please go directly to the Cancer Center and check in at the registration area. °  °Wear comfortable clothing and clothing appropriate for easy access to any Portacath or PICC line.  ° °We strive to give you quality time with your provider. You may need to reschedule your appointment if you arrive late (15 or more minutes).  Arriving late affects you and other patients whose appointments are after yours.  Also, if you miss three or more appointments without notifying the office, you may be dismissed from the clinic at the provider’s discretion.    °  °For prescription refill requests, have your pharmacy contact our office and allow 72 hours for refills to be completed.   ° °Today you received the following chemotherapy and/or immunotherapy agents Pembrolizumab (KEYTRUDA).    °  °To help prevent nausea and vomiting after your treatment, we encourage you to take your nausea medication as directed. ° °BELOW ARE SYMPTOMS THAT SHOULD BE REPORTED IMMEDIATELY: °*FEVER GREATER THAN 100.4 F (38 °C) OR HIGHER °*CHILLS OR SWEATING °*NAUSEA AND VOMITING THAT IS NOT CONTROLLED WITH YOUR NAUSEA MEDICATION °*UNUSUAL SHORTNESS OF BREATH °*UNUSUAL BRUISING OR BLEEDING °*URINARY PROBLEMS (pain or burning when urinating, or frequent urination) °*BOWEL PROBLEMS (unusual diarrhea, constipation, pain near the anus) °TENDERNESS IN MOUTH AND THROAT WITH OR WITHOUT PRESENCE OF ULCERS (sore throat, sores in mouth, or a toothache) °UNUSUAL RASH, SWELLING OR PAIN  °UNUSUAL VAGINAL DISCHARGE OR ITCHING  ° °Items with * indicate a potential emergency and should be followed up as soon as possible or go to the Emergency Department if any problems should occur. ° °Please show the CHEMOTHERAPY ALERT CARD or IMMUNOTHERAPY ALERT CARD  at check-in to the Emergency Department and triage nurse. ° °Should you have questions after your visit or need to cancel or reschedule your appointment, please contact [facility]  Dept: [phone]  and follow the prompts.  Office hours are 8:00 a.m. to 4:30 p.m. Monday - Friday. Please note that voicemails left after 4:00 p.m. may not be returned until the following business day.  We are closed weekends and major holidays. You have access to a nurse at all times for urgent questions. Please call the main number to the clinic Dept: [phone] and follow the prompts. ° ° °For any non-urgent questions, you may also contact your provider using MyChart. We now offer e-Visits for anyone 18 and older to request care online for non-urgent symptoms. For details visit [email or web address]. °  °Also download the MyChart app! Go to the app store, search "MyChart", open the app, select [facility], and log in with your MyChart username and password. ° °Due to Covid, a mask is required upon entering the hospital/clinic. If you do not have a mask, one will be given to you upon arrival. For doctor visits, patients may have 1 support person aged 18 or older with them. For treatment visits, patients cannot have anyone with them due to current Covid guidelines and our immunocompromised population.  ° °Pembrolizumab injection °What is this medication? °PEMBROLIZUMAB (pem broe liz ue mab) is a monoclonal antibody. It is used to treat certain types of cancer. °This medicine may be used for other purposes; ask your health care provider or pharmacist if you have questions. °COMMON BRAND NAME(S):   Keytruda °What should I tell my care team before I take this medication? °They need to know if you have any of these conditions: °autoimmune diseases like Crohn's disease, ulcerative colitis, or lupus °have had or planning to have an allogeneic stem cell transplant (uses someone else's stem cells) °history of  organ transplant °history of chest radiation °nervous system problems like myasthenia gravis or Guillain-Barre syndrome °an unusual or allergic reaction to pembrolizumab, other medicines, foods, dyes, or preservatives °pregnant or trying to get pregnant °breast-feeding °How should I use this medication? °This medicine is for infusion into a vein. It is given by a health care professional in a hospital or clinic setting. °A special MedGuide will be given to you before each treatment. Be sure to read this information carefully each time. °Talk to your pediatrician regarding the use of this medicine in children. While this drug may be prescribed for children as young as 6 months for selected conditions, precautions do apply. °Overdosage: If you think you have taken too much of this medicine contact a poison control center or emergency room at once. °NOTE: This medicine is only for you. Do not share this medicine with others. °What if I miss a dose? °It is important not to miss your dose. Call your doctor or health care professional if you are unable to keep an appointment. °What may interact with this medication? °Interactions have not been studied. °This list may not describe all possible interactions. Give your health care provider a list of all the medicines, herbs, non-prescription drugs, or dietary supplements you use. Also tell them if you smoke, drink alcohol, or use illegal drugs. Some items may interact with your medicine. °What should I watch for while using this medication? °Your condition will be monitored carefully while you are receiving this medicine. °You may need blood work done while you are taking this medicine. °Do not become pregnant while taking this medicine or for 4 months after stopping it. Women should inform their doctor if they wish to become pregnant or think they might be pregnant. There is a potential for serious side effects to an unborn child. Talk to your health care professional or  pharmacist for more information. Do not breast-feed an infant while taking this medicine or for 4 months after the last dose. °What side effects may I notice from receiving this medication? °Side effects that you should report to your doctor or health care professional as soon as possible: °allergic reactions like skin rash, itching or hives, swelling of the face, lips, or tongue °bloody or black, tarry °breathing problems °changes in vision °chest pain °chills °confusion °constipation °cough °diarrhea °dizziness or feeling faint or lightheaded °fast or irregular heartbeat °fever °flushing °joint pain °low blood counts - this medicine may decrease the number of white blood cells, red blood cells and platelets. You may be at increased risk for infections and bleeding. °muscle pain °muscle weakness °pain, tingling, numbness in the hands or feet °persistent headache °redness, blistering, peeling or loosening of the skin, including inside the mouth °signs and symptoms of high blood sugar such as dizziness; dry mouth; dry skin; fruity breath; nausea; stomach pain; increased hunger or thirst; increased urination °signs and symptoms of kidney injury like trouble passing urine or change in the amount of urine °signs and symptoms of liver injury like dark urine, light-colored stools, loss of appetite, nausea, right upper belly pain, yellowing of the eyes or skin °sweating °swollen lymph nodes °weight loss °Side effects that usually do not   require medical attention (report to your doctor or health care professional if they continue or are bothersome): °decreased appetite °hair loss °tiredness °This list may not describe all possible side effects. Call your doctor for medical advice about side effects. You may report side effects to FDA at 1-800-FDA-1088. °Where should I keep my medication? °This drug is given in a hospital or clinic and will not be stored at home. °NOTE: This sheet is a summary. It may not cover all possible  information. If you have questions about this medicine, talk to your doctor, pharmacist, or health care provider. °© 2022 [employer]vier/Gold Standard (2020-11-01 00:00:00) ° °

## 2021-03-07 NOTE — Progress Notes (Signed)
Patient seen by [name] NP today  Vitals are within treatment parameters.  Labs reviewed by [name] NP and are within treatment parameters. OK to treat based on 03/03/21 labs.  Per physician team, patient is ready for treatment and there are NO modifications to the treatment plan.

## 2021-03-07 NOTE — Progress Notes (Signed)
[place] OFFICE PROGRESS NOTE   Diagnosis: Renal cell carcinoma  INTERVAL HISTORY:   Mr. Fiallos returns as scheduled.  He continues lenvatinib.  He completed a cycle of Pembrolizumab 02/14/2021.  No rash or diarrhea.  No change in pain.  He continues OxyContin with oxycodone as needed.  Objective:  Vital signs in last 24 hours:  Blood pressure (!) 157/59, pulse (!) 52, temperature (!) 97.5 F (36.4 C), temperature source Tympanic, resp. rate 18, weight 229 lb 12.8 oz (104.2 kg), SpO2 99 %.    HEENT: No thrush or ulcers. Resp: Lungs clear bilaterally. Cardio: Regular rate and rhythm. GI: Abdomen soft and nontender.  No hepatosplenomegaly. Vascular: No leg edema. Skin: No rash. Port-A-Cath without erythema.   Lab Results:  Lab Results  Component Value Date   WBC 6.4 03/03/2021   HGB 12.1 (L) 03/03/2021   HCT 38.3 (L) 03/03/2021   MCV 88.5 03/03/2021   PLT 268 03/03/2021   NEUTROABS 5.1 03/03/2021    Imaging:  No results found.  Medications: I have reviewed the patient's current medications.  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, L4 nerve compression Biopsy of the L4 mass 11/18/2015 confirmed metastatic renal cell carcinoma, clear cell type CTs of the chest, abdomen, and pelvis 11/18/2015-right lower lobe nodule, expansile lytic lesion at the right 11th rib/costal vertebral junction, left renal mass, expansile lesion involving the L4 vertebra, lytic lesion at the left acetabulum, and a low-attenuation liver lesion Initiation of SRS to L4 12/02/2015, Completed 12/12/2015 Initiation of Pazopanib 12/30/2015 Pazopanib placed on hold 02/06/2016 secondary to elevated liver enzymes Pazopanib resumed 03/07/2016 at a dose of 400 mg daily  Pazopanib discontinued 03/19/2016 secondary to elevated liver enzymes Restaging CTs 04/02/2016-stable left renal mass, decreased soft tissue component associated with the  L4 metastasis, increased soft tissue component associated with the right 11th rib metastasis with increased T11 bony destruction, increased sclerosis at the left acetabulum lesion Cycle 1 nivolumab 04/12/2016 Cycle 2 nivolumab 04/26/2016 Cycle 3 nivolumab 05/11/2016 Cycle 4 nivolumab 05/24/2016 Cycle 5 nivolumab 06/08/2016 MRI lumbar spine 06/21/2016-unchanged tumor at L3, increased size of retroperitoneal lymph nodes compared to a CT from 04/02/2016 Cycle 6 nivolumab  06/22/2016 CTs chest, abdomen, and pelvis 07/04/2016-enlargement of the left renal mass, right adrenal nodule, left hilar and peritoneal lymph nodes, enlargement of left acetabular lesion. Stable lung nodules. Cycle 7 nivolumab 07/06/2016 Cycle 8 nivolumab 07/20/2016 Cycle 9 nivolumab 08/02/2016 Cycle 10 nivolumab 08/20/2016 Restaging CT 09/03/2016 evaluation with stable disease Cycle 11 nivolumab 09/05/2016 Cycle 12 nivolumab 09/19/2016 Cycle 13 nivolumab 10/03/2016 Cycle 14 nivolumab 10/17/2016 Cycle 15 nivolumab 10/31/2016 Cycle 16 nivolumab 11/14/2016 (changed to monthly schedule) Cycle 17 nivolumab 12/19/2016 CTs 01/21/2017-increased left renal mass, increased size of adrenal metastases, increased lytic bone lesions, increased left lung nodule, persistent tumor at L4 with probable epidural component Initiation of Cabozantinib 01/28/2017 Restaging CTs 05/30/2017- decreased size of left hilar mass, left renal mass, retroperitoneal adenopathy, and adrenal metastasis.  Healing bone lesions. Cabozantinib continued CTs 10/21/2017- interval enlargement left hilar lymph node; stable rib lesions; stable mass left renal cortex; stable mildly nodular adrenal glands; stable lytic lesions within the pelvis and spine. Cabozantinib continued CTs 02/21/2018- enlargement of an AP window lymph node.  Mildly enlarged left hilar lymph node is unchanged.  Primary renal cell carcinoma involving the upper pole of the left kidney appears similar.   Stable enlarged right periaortic lymph node adjacent to the renal vessels.  Stable multifocal bony metastatic disease. Cabozantinib  continued CTs 07/29/2018- stable 15 mm AP window nodes; stable left hilar node; subcarinal node slightly larger; right periaortic node 16 mm, previously 12 mm; portacaval node 24 mm, previously 16 mm; 15 mm node superior to the pancreatic head has enlarged; interval increase nodularity of both adrenal glands; stable left kidney mass; multifocal bony metastatic disease not significantly changed. Cabozantinib continued CTs 11/06/2018- moderate improvement in thoracic adenopathy; minimal improvement abdominal adenopathy; left kidney upper pole mass and various lytic expansile bone lesions stable; mild increase in nodularity of left adrenal gland; right adrenal gland nodularity stable. 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  Peripheral enhancing masses consistent with metastases. No hemorrhage. MRI brain 03/11/2019-7 enhancing brain masses consistent with metastatic disease SRS to 7 brain lesions, treatment given 03/19/2019, 03/23/2019, and 03/25/2019 CTs 04/09/2019-decrease in left renal mass, bilateral adrenal nodules, AP window and porta hepatic adenopathy.  New 9 mm right lower lobe nodule.  Improved lytic lesion of the left acetabulum.  Other bone lesions are stable. Cabozantinib continued MRI brain 07/17/2019-resolution of 4 mm treated lesion in the right frontal cortex, 6 remaining treated lesions have decreased in size, new punctate metastasis in the superior right cerebellum SRS to right cerebellar lesion 07/30/2019 CTs 08/17/2019-previously noted right lower lobe nodule resolved, stable left kidney mass, mixed lytic/sclerotic bone lesions in the thoracolumbar spine, right posterior ribs, and left acetabulum-unchanged, no evidence of progressive disease   Cabozantinib continued MRI brain 10/23/2019-stable to slight decrease in size of multiple enhancing intracranial lesions.  Slight increase in surrounding edema in the medial left frontal lobe.  No new lesions present. MRI brain 01/29/2020-multiple enhancing brain lesions, some hemorrhagic, some with mild enlargement-treatment effect? CTs 02/08/2020-no thoracic metastases, enlargement of the left renal mass, enlargement of lytic lesion at T9, other lytic lesions unchanged MRI brain 05/04/2020-no new lesions, slight enlargement of a right occipital lesion, other lesions are stable or decreased in size CTs 08/11/2020-enlargement of left kidney mass, new 1.6 cm segment 7 liver lesion, enlargement of a lytic lesion at T9, increased lysis of a sclerotic lesion at the right second rib, 0.7 cm endoluminal nodule at the bladder dome-enlarged Brain MRI 08/10/2020-slight increase in a 15 mm left frontal lobe lesion, new punctate focus in the right occipital cortex, other lesions stable or slightly decreased CTs 11/01/2020-increase in left suprahilar opacity, enlargement of previous liver metastases, several new subcentimeter lesions, increase in left renal mass, similar appearance of bone metastases, stable hyperdense/enhancing nodule in the left bladder Brain MRI 11/04/2020-no change in brain metastases Lenvatinib/pembrolizumab 11/30/2020 CTs 03/03/2021-stable left hilar lymph nodes; resolution of left upper lobe perihilar nodularity; unchanged for millimeter peripheral right upper lobe nodule; multiple lytic bone lesions appear stable; left kidney lesion decreased in size; bilateral adrenal nodules decreased in size; several liver lesion showed mild increase in size; several new small lesions within the right hepatic lobe. Lenvatinib/pembrolizumab continued 03/07/2021 Pain secondary to #1-managed by Dr. Lovenia Shuck.  Improved Hypertension Elevated transaminases 02/06/2016- Pazopanib placed on hold Liver enzymes normal  03/07/2016 Port-A-Cath placement 05/16/2016 Malaise/anorexia 09/05/2016. Cortisol and testosterone levels low. Hydrocortisone and testosterone replacement initiated. Conjunctival/scleral erythema 09/19/2016-resolved with steroid eyedrops Proximal right leg weakness. 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 Hypercalcemia status post Zometa 01/23/2017-resolved Pruritic rash following IV contrast 10/21/2017; rash following IV contrast 02/21/2018 despite prednisone/Benadryl premedication Brief episodes of expressive aphasia October and December 2020      Disposition: Mr. Blackshire appears  stable.  He has been maintained on lenvatinib/pembrolizumab since 11/30/2020.  He is tolerating treatment well and there is no clinical evidence of disease progression.  The recent restaging CT scans are overall stable with the exception of 2 "new" tiny liver lesions.  There was a 1 month gap between the comparison CT scan and initiation of lenvatinib/Pembrolizumab.  Dr. [name] recommends continuing the current treatment.  Mr. Deruiter and his wife are in agreement.  Plan to proceed with Pembrolizumab today as scheduled.  Labs from 03/03/2021 reviewed, adequate to proceed as above.  He will return for lab, follow-up, Pembrolizumab in 3 weeks.  He will contact the office in the interim with any problems.  Patient seen with Dr. [name].  CT images reviewed with Mr. Orosz and his wife.  [name] ANP/GNP-BC   03/07/2021  2:03 PM This was a shared visit with [name].  Mr. Gatling continues to tolerate the lenvatinib and pembrolizumab well.  We reviewed the CT findings and images with him.  The CTs are consistent with overall stable disease and minimal progression in the liver.  There was a gap between the baseline CT at the start of the current treatment regimen.  I recommend continuing lenvatinib and pembrolizumab.  We will plan for a restaging CT after 3 more cycles of  pembrolizumab.  I was present for greater than 50% of today's visit.  I performed medical decision making.  Julieanne Manson, MD

## 2021-03-07 NOTE — Progress Notes (Signed)
Patient presents for treatment. RN assessment completed along with the following:  Labs/vitals reviewed - Yes, and ok to use labs from 03/03/21    Weight within 10% of previous measurement - Yes Oncology Treatment Attestation completed for current therapy- Yes, on date 11/11/20 Informed consent completed and reflects current therapy/intent - Yes, on date 11/30/20             Provider progress note reviewed - Yes, today's provider note was reviewed. Treatment/Antibody/Supportive plan reviewed - Yes, and there are no adjustments needed for today's treatment. S&H and other orders reviewed - Yes, and there are no additional orders identified. Previous treatment date reviewed - Yes, and the appropriate amount of time has elapsed between treatments. Clinic Hand Off Received from - [name], NP  Patient to proceed with treatment.

## 2021-03-08 ENCOUNTER — Encounter: Payer: Self-pay | Admitting: Oncology

## 2021-03-08 ENCOUNTER — Other Ambulatory Visit (HOSPITAL_COMMUNITY): Payer: Self-pay

## 2021-03-08 ENCOUNTER — Telehealth: Payer: Self-pay

## 2021-03-08 NOTE — Progress Notes (Signed)
° °  Covid-19 Vaccination Clinic  Name:  Savan Ruta    MRN: [id number] DOB: 11/28/1945  03/08/2021  Mr. Scorsone was observed post Covid-19 immunization for 15 minutes without incident. He was provided with Vaccine Information Sheet and instruction to access the V-Safe system.   Mr. Carpenter was instructed to call 911 with any severe reactions post vaccine: Difficulty breathing  Swelling of face and throat  A fast heartbeat  A bad rash all over body  Dizziness and weakness   Immunizations Administered     Name Date Dose VIS Date Route   Pfizer Covid-19 Vaccine Bivalent Booster 03/07/2021  4:09 PM 0.3 mL 10/26/2020 Intramuscular   Manufacturer: [place]   Lot: [id number]   [place]: [phone]

## 2021-03-08 NOTE — Telephone Encounter (Signed)
Faxed over patient lab result to [phone] [facility] internal Medicine

## 2021-03-08 NOTE — Telephone Encounter (Signed)
-----   Message from Owens Shark, NP sent at 03/08/2021  7:54 AM EST ----- Please forward b12 and vitamin d level from yesterday to PCP, Dr [name]

## 2021-03-09 ENCOUNTER — Telehealth: Payer: Self-pay

## 2021-03-09 ENCOUNTER — Other Ambulatory Visit (HOSPITAL_COMMUNITY): Payer: Self-pay

## 2021-03-09 NOTE — Telephone Encounter (Signed)
Called and spoke with patient wife to decrease hydrocortisone to 1 tablet twice daily. Patient's wife voiced understanding

## 2021-03-09 NOTE — Telephone Encounter (Signed)
-----   Message from Owens Shark, NP sent at 03/08/2021 10:19 AM EST ----- Please call his wife with instructions to decrease hydrocortisone to 1 tablet twice daily.

## 2021-03-10 ENCOUNTER — Ambulatory Visit
Admission: RE | Admit: 2021-03-10 | Discharge: 2021-03-10 | Disposition: A | Discharge status: Home / Self Care | Payer: Self-pay | Source: Ambulatory Visit | Attending: Internal Medicine | Admitting: Internal Medicine

## 2021-03-10 ENCOUNTER — Other Ambulatory Visit: Payer: Self-pay

## 2021-03-10 DIAGNOSIS — Z95828 Presence of other vascular implants and grafts: Secondary | ICD-10-CM

## 2021-03-10 DIAGNOSIS — C7931 Secondary malignant neoplasm of brain: Secondary | ICD-10-CM | POA: Diagnosis not present

## 2021-03-10 DIAGNOSIS — C801 Malignant (primary) neoplasm, unspecified: Secondary | ICD-10-CM | POA: Diagnosis not present

## 2021-03-10 MED ORDER — SODIUM CHLORIDE 0.9% FLUSH
10.0000 mL | INTRAVENOUS | Status: DC | PRN
  Administered 2021-03-10: 10 mL via INTRAVENOUS

## 2021-03-10 MED ORDER — GADOBENATE DIMEGLUMINE 529 MG/ML IV SOLN
20.0000 mL | Freq: Once | INTRAVENOUS | Status: AC | PRN
  Administered 2021-03-10: 20 mL via INTRAVENOUS

## 2021-03-10 MED ORDER — HEPARIN SOD (PORK) LOCK FLUSH 100 UNIT/ML IV SOLN
500.0000 [IU] | Freq: Once | INTRAVENOUS | Status: AC
  Administered 2021-03-10: 500 [IU] via INTRAVENOUS

## 2021-03-11 ENCOUNTER — Other Ambulatory Visit (HOSPITAL_COMMUNITY): Payer: Self-pay

## 2021-03-13 ENCOUNTER — Other Ambulatory Visit (HOSPITAL_COMMUNITY): Payer: Self-pay

## 2021-03-13 ENCOUNTER — Inpatient Hospital Stay: Payer: PPO

## 2021-03-13 ENCOUNTER — Inpatient Hospital Stay (HOSPITAL_BASED_OUTPATIENT_CLINIC_OR_DEPARTMENT_OTHER): Payer: PPO | Admitting: Internal Medicine

## 2021-03-13 ENCOUNTER — Other Ambulatory Visit: Payer: Self-pay

## 2021-03-13 ENCOUNTER — Encounter: Payer: Self-pay | Admitting: Oncology

## 2021-03-13 VITALS — BP 161/63 | HR 60 | Temp 97.2°F | Resp 17 | Wt 232.4 lb

## 2021-03-13 DIAGNOSIS — C7931 Secondary malignant neoplasm of brain: Secondary | ICD-10-CM

## 2021-03-13 DIAGNOSIS — R569 Unspecified convulsions: Secondary | ICD-10-CM | POA: Diagnosis not present

## 2021-03-13 DIAGNOSIS — Z5112 Encounter for antineoplastic immunotherapy: Secondary | ICD-10-CM | POA: Diagnosis not present

## 2021-03-13 NOTE — Progress Notes (Signed)
[facility] [place], [place] [address] [phone]   Interval Evaluation  Date of Service: 03/13/21 Patient Name: Phillip [name] Patient MRN: [id number] Patient DOB: 1945/08/25 Provider: [name], MD  Identifying Statement:  Phillip [name] is a 76 y.o. male with Brain metastases [facility]) [C79.31]   Primary Cancer: Renal Cell Carcinoma  CNS Oncologic History: 03/19/19-03/25/19 SRS Treatment: PTV1 Lt Temporal 38m 27 Gy in 3 fractions PTV2 Lt Occipital 115m 27 Gy in 3 fractions PTV6 Lt Frontal 2059m27 Gy in 3 fractions   03/19/19 SRS Treatment:  PTV3 Rt Occipital 13m17mTV4 Lt Frontal 12mm55mTV5 Rt Frontal 4mm  31m7 Lt Parietal 9mm  A35mof these were treated to 20 Gy in 1 fraction   12/02/2015 to 12/12/2015 SBRT Treatment:  The L4 Right spinal was treated to 50 Gy in 5 fractions at 10 Gy per fraction  Interval History:  Phillip [name] today for follow up after recent brain MRI.  Denies any new or progressive neurologic deficits.  Denies headaches and seizures. Remains active at home and at church. Had progression of renal cell carcinoma in the fall, and is now on Levatinib and Keytruda with Dr. SherrilBenay Spicecations: Current Outpatient Medications on File Prior to Visit  Medication Sig Dispense Refill   acetaminophen (TYLENOL) 650 MG CR tablet Take 650 mg by mouth every 8 (eight) hours as needed for pain.     COVID-19 mRNA bivalent vaccine, Pfizer, (PFIZER COVID-19 VAC BIVALENT) injection Inject into the muscle. 0.3 mL 0   COVID-19 mRNA Vac-TriS, Pfizer, (PFIZER-BIONT COVID-19 VAC-TRIS) SUSP injection Inject into the muscle. (Patient not taking: Reported on 03/07/2021) 0.3 mL 0   diphenhydrAMINE (BENADRYL) 50 MG tablet Take 1 tablet (50 mg total) by mouth as directed. Take Benadryl 50 mg by mouth 1 hour before CT scan. (Patient not taking: Reported on 02/14/2021) 1 tablet 0   docusate sodium  (COLACE) 100 MG capsule Take 100 mg by mouth 2 (two) times daily.     famotidine (PEPCID) 10 MG tablet Take 10 mg by mouth 2 (two) times daily.     hydrocortisone (CORTEF) 10 MG tablet TAKE 2 TABLETS BY MOUTH IN THE MORNING AND 1 TABLET IN THE EVENING 90 tablet 3   ibuprofen (ADVIL,MOTRIN) 200 MG tablet Take 200 mg by mouth every 8 (eight) hours as needed for fever, headache, mild pain, moderate pain or cramping.      lenvatinib 18 mg daily dose (LENVIMA, 18 MG DAILY DOSE,) 10 MG & 2 x 4 MG capsule Take 18 mg by mouth daily. 90 capsule 0   levETIRAcetam (KEPPRA) 500 MG tablet TAKE 1 TABLET BY MOUTH 2 TIMES DAILY 60 tablet 2   lidocaine-prilocaine (EMLA) cream APPLY TO PORTACATH 1 HOUR PRIOR TO USE AS NEEDED 30 g 2   LORazepam (ATIVAN) 0.5 MG tablet Take 1-2 tablets (0.5-1 mg total) by mouth every 6 (six) hours as needed for anxiety. 60 tablet 2   losartan-hydrochlorothiazide (HYZAAR) 100-12.5 MG tablet Take 1 tablet by mouth daily.      losartan-hydrochlorothiazide (HYZAAR) 100-12.5 MG tablet Take 1 tablet by mouth once a day. 90 tablet 3   mirtazapine (REMERON) 30 MG tablet TAKE 1 TABLET BY MOUTH EVERY EVENING AT BEDTIME 90 tablet 5   Multiple Vitamin (MULTIVITAMIN ADULT PO) Take 1 tablet by mouth daily. [place] product     ondansetron (ZOFRAN) 8 MG tablet TAKE  1 TABLET BY MOUTH EVERY 8 HOURS AS NEEDED FOR NAUSEA AND VOMITING 90 tablet 2   oxyCODONE (OXY IR/ROXICODONE) 5 MG immediate release tablet Take 1 tablet by mouth every 6 hours as needed for severe pain. 60 tablet 0   oxyCODONE (OXYCONTIN) 10 mg 12 hr tablet TAKE 2 TABLETS BY MOUTH EVERY 12 HOURS. 120 tablet 0   polyethylene glycol (MIRALAX / GLYCOLAX) 17 g packet Take 17 g by mouth daily.     predniSONE (DELTASONE) 50 MG tablet Take 1 tablet (50 mg) 13 hours, 7 hours and 1 hour prior to CT scan. Take Benadryl OTC 50 mg 1 hour prior to scan 3 tablet 0   prochlorperazine (COMPAZINE) 10 MG tablet Take 1 tablet by mouth every 6 hours as needed  for nausea or vomiting. 30 tablet 2   Testosterone 1.62 % GEL APPLY 2 PUMPS TO AREA ONCE A DAY AS DIRECTED 75 g 3   Testosterone 1.62 % GEL Apply 2 pumps topically daily 75 g 3   Current Facility-Administered Medications on File Prior to Visit  Medication Dose Route Frequency Provider Last Rate Last Admin   sodium chloride flush (NS) 0.9 % injection 10 mL  10 mL Intravenous PRN [name], MD   10 mL at 06/22/16 1062    Allergies:  Allergies  Allergen Reactions   Contrast Media [Iodinated Contrast Media] Rash   Prednisone & Diphenhydramine Rash   Past Medical History:  Past Medical History:  Diagnosis Date   Anxiety    Brain cancer ([place])    Chronic kidney disease    renal cancer   Constipation    Depression    Hypertension    Low testosterone in male    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   Seizures ([place])    last seizure 01/2019 - controlled since on keppra   Wears glasses    Past Surgical History:  Past Surgical History:  Procedure Laterality Date   insertion port-a-cath  2018   IR GENERIC HISTORICAL  11/18/2015   IR FLUORO GUIDED NEEDLE PLC ASPIRATION/INJECTION LOC 11/18/2015 [name], MD [facility]-INTERV RAD   IR GENERIC HISTORICAL  05/16/2016   IR FLUORO GUIDE PORT INSERTION RIGHT 05/16/2016 [name], MD [facility]-INTERV RAD   IR GENERIC HISTORICAL  05/16/2016   IR US GUIDE VASC ACCESS RIGHT 05/16/2016 [name], MD [facility]-INTERV RAD   RADIOLOGY WITH ANESTHESIA N/A 06/16/2019   Procedure: MRI BRAIN WITH AND WITHOUT CONTRAST WITH ANESTHESIA;  Surgeon: Radiologist, Medication, MD;  Location: [facility];  Service: Radiology;  Laterality: N/A;   Social History:  Social History   Socioeconomic History   Marital status: Single    Spouse name: Not on file   Number of children: Not on file   Years of education: Not on file   Highest education level: Not on file  Occupational History   Not on file  Tobacco Use   Smoking status: Never   Smokeless tobacco:  Never  Vaping Use   Vaping Use: Never used  Substance and Sexual Activity   Alcohol use: Not Currently    Alcohol/week: 1.0 standard drink    Types: 1 Glasses of wine per week   Drug use: Yes    Types: Marijuana    Comment: Last use 06/13/19   Sexual activity: Not Currently  Other Topics Concern   Not on file  Social History Narrative   Not on file   Social Determinants of Health   Financial Resource  Strain: Not on file  Food Insecurity: Not on file  Transportation Needs: Not on file  Physical Activity: Not on file  Stress: Not on file  Social Connections: Not on file  Intimate Partner Violence: Not on file   Family History:  Family History  Problem Relation Age of Onset   Cancer Grandchild        lymphoma    Review of Systems: Constitutional: Doesn't report fevers, chills or abnormal weight loss Eyes: Doesn't report blurriness of vision Ears, nose, mouth, throat, and face: Doesn't report sore throat Respiratory: Doesn't report cough, dyspnea or wheezes Cardiovascular: Doesn't report palpitation, chest discomfort  Gastrointestinal:  Doesn't report nausea, constipation, diarrhea GU: Doesn't report incontinence Skin: Doesn't report skin rashes Neurological: Per HPI Musculoskeletal: Doesn't report joint pain Behavioral/Psych: Doesn't report anxiety  Physical Exam: There were no vitals filed for this visit.  KPS: 90. General: Alert, cooperative, pleasant, in no acute distress Head: Normal EENT: No conjunctival injection or scleral icterus.  Lungs: Resp effort normal Cardiac: Regular rate Abdomen: Non-distended abdomen Skin: No rashes cyanosis or petechiae. Extremities: No clubbing or edema  Neurologic Exam: Mental Status: Awake, alert, attentive to examiner. Oriented to self and environment. Language is fluent with intact comprehension.  Cranial Nerves: Visual acuity is grossly normal. Visual fields are full. Extra-ocular movements intact. No ptosis. Face is  symmetric Motor: Tone and bulk are normal. Power is full in both arms and legs. Reflexes are symmetric, no pathologic reflexes present.  Sensory: Intact to light touch Gait: Normal.   Labs: I have reviewed the data as listed    Component Value Date/Time   NA 137 03/03/2021 0954   NA 140 02/21/2017 1128   K 4.4 03/03/2021 0954   K 4.3 02/21/2017 1128   CL 100 03/03/2021 0954   CO2 27 03/03/2021 0954   CO2 26 02/21/2017 1128   GLUCOSE 164 (H) 03/03/2021 0954   GLUCOSE 115 02/21/2017 1128   BUN 16 03/03/2021 0954   BUN 16.6 02/21/2017 1128   CREATININE 0.81 03/03/2021 0954   CREATININE 0.8 02/21/2017 1128   CALCIUM 9.3 03/03/2021 0954   CALCIUM 9.4 02/21/2017 1128   PROT 7.2 03/03/2021 0954   PROT 7.5 02/21/2017 1128   ALBUMIN 4.1 03/03/2021 0954   ALBUMIN 4.2 02/21/2017 1128   AST 23 03/03/2021 0954   AST 20 02/21/2017 1128   ALT 28 03/03/2021 0954   ALT 43 02/21/2017 1128   ALKPHOS 89 03/03/2021 0954   ALKPHOS 104 02/21/2017 1128   BILITOT 0.3 03/03/2021 0954   BILITOT 0.36 02/21/2017 1128   GFRNONAA >60 03/03/2021 0954   GFRAA >60 11/05/2019 0957   Lab Results  Component Value Date   WBC 6.4 03/03/2021   NEUTROABS 5.1 03/03/2021   HGB 12.1 (L) 03/03/2021   HCT 38.3 (L) 03/03/2021   MCV 88.5 03/03/2021   PLT 268 03/03/2021    Imaging:  MR BRAIN W WO CONTRAST  Addendum Date: 03/13/2021   ADDENDUM REPORT: 03/13/2021 07:41 ADDENDUM: Case discussed at tumor board. Additional punctate lesion at the right frontal operculum on 12:94, not seen prior, please correlate with initial treatment plan to determine new versus recurrent enhancement. Electronically Signed   By: Jorje Guild M.D.   On: 03/13/2021 07:41   Result Date: 03/13/2021 CLINICAL DATA:  Brain metastases, assess treatment response, history of renal cell carcinoma EXAM: MRI HEAD WITHOUT AND WITH CONTRAST TECHNIQUE: Multiplanar, multiecho pulse sequences of the brain and surrounding structures were obtained  without and with  intravenous contrast. CONTRAST:  73m MULTIHANCE GADOBENATE DIMEGLUMINE 529 MG/ML IV SOLN COMPARISON:  11/04/2020 FINDINGS: Brain: Previously noted metastatic lesions that have increased in size: - High left frontal lobe lesion now measures approximately 9 x 11 x 9 mm (AP x TR x CC) (series 12, image 142 and series 13, image 26), previously 8 x 7 x 6 mm when remeasured similarly. - Medial left frontal lobe lesion measures 19 x 16 x 23 mm (AP x TR x CC) (series 12, image 124 and series 13, image 32), previously 15 x 15 x 16 mm when remeasured similarly. Increased confluent T2 hyperintense signal is seen associated with the two left frontal lobe lesions. - Right lateral occipital lesion measures 8 x 5 x 6 mm (series 12, image 65 and series 13, image 7), previously 6 x 5 x 5 mm when remeasured similarly. - More superior and medial right occipital lesion measures 3 mm (series 12, image 73), previously 1-2 mm. Unchanged: - Left occipital lesion, measuring 9 x 5 x 5 mm (series 12, image 49 and series 13, image 9). - 4 mm lesion in the medial left parietal lobe (series 12, image 105) - Punctate enhancing lesions in the left cerebellum (series 12, image 25), right cerebellum (series 12, image 43), and right frontal lobe (series 12, image 126) are unchanged. Previously noted enhancing focus in the medial left temporal lobe is no longer definitively seen. No definite new lesions. Redemonstrated punctate focus of restricted diffusion, intrinsic T1 hyperintense signal, and faint enhancement in the lateral left temporal lobe, associated with an area of encephalomalacia and gliosis, which is not favored to represent a metastatic lesion. Focus of restricted diffusion in the superior left frontal lobe, associated with the high left frontal lobe lesion (series 3, image 75 and medial left frontal lobe lesion (series 3, image 69). No foci of restricted diffusion to suggest acute or subacute infarct. No acute  hemorrhage. No midline shift. No extra-axial collection or hydrocephalus. 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 T2 hyperintense signal in the periventricular white matter, likely the sequela of chronic small vessel ischemic disease. Vascular: Normal flow voids. Skull and upper cervical spine: Normal marrow signal. Sinuses/Orbits: Mild mucosal thickening in the paranasal sinuses. The orbits are unremarkable. Other: The mastoids are well aerated. IMPRESSION: 1. Interval increase in the size of several metastatic lesions, most prominently the high left and medial left frontal lobe lesions, which are associated with increasing T2 hyperintense signal, likely edema. Additional enhancing lesions in the right occipital lobe have also slightly increased in size. 2. The remainder of the previously noted lesions are either unchanged or, in the case of enhancing focus in the medial left temporal lobe, no longer seen. No new metastatic lesions. Electronically Signed: By: AMerilyn BabaM.D. On: 03/10/2021 15:06   CT CHEST ABDOMEN PELVIS W CONTRAST  Result Date: 03/03/2021 CLINICAL DATA:  Renal cell carcinoma.  Restaging. EXAM: CT CHEST, ABDOMEN, AND PELVIS WITH CONTRAST TECHNIQUE: Multidetector CT imaging of the chest, abdomen and pelvis was performed following the standard protocol during bolus administration of intravenous contrast. CONTRAST:  105mOMNIPAQUE IOHEXOL 300 MG/ML  SOLN COMPARISON:  11/01/2020 FINDINGS: CT CHEST FINDINGS Cardiovascular: Normal heart size. No pericardial effusion. There is aortic atherosclerosis with coronary artery calcifications. Mediastinum/Nodes: No enlarged mediastinal,, supraclavicular or axillary lymph nodes. Left hilar lymph node measures 1.2 cm, image 26/2. Unchanged from previous exam. Thyroid gland, trachea, nd esophagus demonstrate no significant findings. Lungs/Pleura: No pleural effusion  identified. Interval resolution of left upper lobe  perihilar nodularity. Peripheral right upper lobe lung nodule measures 4 mm and is unchanged from previous exam. Musculoskeletal: Metastatic lesion involving the T9 vertebral body is unchanged in appearance from previous exam, image 74/6. Multiple lytic bone lesions are also noted involving the ribs and appears stable in the interval. CT ABDOMEN PELVIS FINDINGS Hepatobiliary: Multifocal liver metastases are identified. Index lesion in right hepatic lobe measures 2.8 x 1.6 cm, image 55/2. Previously 2.4 x 1.6 cm. Index lesion within the central aspect of the left hepatic lobe measures 1.5 cm, image 54/2. Unchanged from previous exam. Segment 2/3 lesion in lateral segment of left hepatic lobe measures 1 cm, image 56/2. Previously 0.5 cm. Index lesion within segment 4 adjacent to the falciform ligament measures 0.9 cm, image 56/2. Unchanged from previous exam. Lesion in segment 4 B adjacent to the gallbladder fossa measures 0.9 cm, image 59/2. Previously 0.6 cm. Lesion within segment 7 measures 0.8 cm, image 54/2. New from previous exam. Lesion within segment 6 measures 6 mm and is also new in the interval, image 65/2. Gallstones are again identified.  No biliary dilatation. Pancreas: Unremarkable. No pancreatic ductal dilatation or surrounding inflammatory changes. Spleen: Normal in size without focal abnormality. Adrenals/Urinary Tract: Bilateral adrenal nodules are again noted. Index nodule in the right adrenal gland measures 1.2 cm, image 61/2. Decreased from 2 cm previously. Also arising off the left adrenal gland is a 0.9 cm nodule, image 63/2. Previously this measured 1.5 cm. The right adrenal nodule measures 1 cm, image 65/2. Previously 1.3 cm. The left kidney mass measures 3 x 3.4 cm, image 67/2. Formally this measured 4.0 x 3.8 cm. No new kidney mass or hydronephrosis identified. The bladder appears decompressed. Enhancing nodule previously seen within the left bladder is not confidently identified on today's  study Stomach/Bowel: Stomach appears normal. The appendix appears mildly thickened measuring 9 mm. This is compared with the same previously no significant bowel wall thickening, inflammation, or distension. Vascular/Lymphatic: Aortic atherosclerosis. No aneurysm. Index hepatic of duodenal ligament lymph node measures 1.1 cm short axis, image 65/2. Formally 1.4 cm. Reproductive: Prostate gland is enlarged.  identified. Other: No free fluid or fluid collections. Musculoskeletal: Multifocal areas of bone metastases are again noted involving the bony pelvis, sacrum and lumbar spine. These appears stable from previous exam. IMPRESSION: 1. The left kidney lesion is decreased in size in the interval. Additionally, bilateral adrenal nodules have decreased in size in the interval. 2. Multifocal liver metastases are again noted. Several of these lesions show mild increased in size in the interval. There also several new small lesions within the right hepatic lobe. 3. Unchanged appearance of multifocal bone metastases. 4. Left upper lobe perihilar nodule has resolved in the interval. Adjacent left hilar lymph node is stable. There are no new sites of disease within the chest. 5. Previously described enhancing nodule previously seen within the left bladder is not confidently identified on today's study. 6. Aortic Atherosclerosis (ICD10-I70.0). Electronically Signed   By: Kerby Moors M.D.   On: 03/03/2021 20:49     [facility] Clinician Interpretation: I have personally reviewed the radiological images as listed.  My interpretation, in the context of the patient's clinical presentation, is progressive disease   Assessment/Plan Brain metastases [facility]) [C79.31]  Phillip [name] is clinically stable today.  MRI demonstrates clear progression of left mesial frontal metastasis.  In addition, there is tiny novel focus of enhancement within R frontal lobe likely consistent with new  metastasis.  Otherwise continues to  demonstrate good cognitive and motor function.  We discussed options for treatment moving forward for dominant left frontal lesion, including craniotomy, salvage SRS, and LITT referral.  The small new right frontal lesion should be amenable to [facility] as discussed in tumor board today.      He should continue Keppra 51m BID for seizure prevention.  Phillip LCosta Jhawill take some time at home to think over our recommendations and treatment plan options regarding the progressive left frontal lesion.  We appreciate the opportunity to participate in the care of Phillip [name]  We will follow up with him once plan is set in place.  All questions were answered. The patient knows to call the clinic with any problems, questions or concerns. No barriers to learning were detected.  I have spent a total of 40 minutes of face-to-face and non-face-to-face time, excluding clinical staff time, preparing to see patient, ordering tests and/or medications, counseling the patient, and independently interpreting results and communicating results to the patient/family/caregiver    ZVentura Sellers MD Medical Director of Neuro-Oncology [facility] [place]01/16/23 12:07 PM

## 2021-03-14 ENCOUNTER — Other Ambulatory Visit (HOSPITAL_COMMUNITY): Payer: Self-pay

## 2021-03-15 ENCOUNTER — Other Ambulatory Visit (HOSPITAL_COMMUNITY): Payer: Self-pay

## 2021-03-17 ENCOUNTER — Other Ambulatory Visit (HOSPITAL_COMMUNITY): Payer: Self-pay

## 2021-03-17 ENCOUNTER — Other Ambulatory Visit: Payer: Self-pay | Admitting: Oncology

## 2021-03-20 ENCOUNTER — Other Ambulatory Visit: Payer: Self-pay | Admitting: Oncology

## 2021-03-20 ENCOUNTER — Telehealth: Payer: Self-pay | Admitting: *Deleted

## 2021-03-20 ENCOUNTER — Other Ambulatory Visit (HOSPITAL_COMMUNITY): Payer: Self-pay

## 2021-03-20 MED FILL — Lenvatinib Cap Ther Pack 10 MG & 2 x 4 MG (18 MG Daily Dose): ORAL | 90 days supply | Qty: 90 | Fill #0 | Status: CN

## 2021-03-20 NOTE — Telephone Encounter (Signed)
Patients wife called to report that they have decided to proceed with the recommended referral to Dr Brett Albino @ [facility] for potential LITT procedure for the left sided lesion.  After patient is cleared from Dr Brett Albino then can assess the Right sided lesion.  Referral Email initiated by Dr Mickeal Skinner.  Demographics faxed to Gay Filler, assistance to Dr Brett Albino & Ardeen Fillers, FNP

## 2021-03-21 ENCOUNTER — Other Ambulatory Visit: Payer: Self-pay | Admitting: Radiation Therapy

## 2021-03-21 DIAGNOSIS — C7931 Secondary malignant neoplasm of brain: Secondary | ICD-10-CM

## 2021-03-22 ENCOUNTER — Telehealth: Payer: Self-pay | Admitting: Radiation Oncology

## 2021-03-22 NOTE — Telephone Encounter (Signed)
I called the patient's spouse Mrs. Wise and let her know we had been reviewing Phillip [name]'s case. I had to leave her a message though asking her to call back so we could review recommendations and next steps for his care.

## 2021-03-23 ENCOUNTER — Other Ambulatory Visit (HOSPITAL_COMMUNITY): Payer: Self-pay

## 2021-03-23 ENCOUNTER — Telehealth: Payer: Self-pay

## 2021-03-23 ENCOUNTER — Telehealth: Payer: Self-pay | Admitting: Radiation Oncology

## 2021-03-23 ENCOUNTER — Inpatient Hospital Stay (HOSPITAL_BASED_OUTPATIENT_CLINIC_OR_DEPARTMENT_OTHER): Payer: PPO | Admitting: Internal Medicine

## 2021-03-23 ENCOUNTER — Inpatient Hospital Stay
Admission: RE | Admit: 2021-03-23 | Discharge: 2021-03-23 | Disposition: A | Discharge status: Home / Self Care | Payer: Self-pay | Source: Ambulatory Visit | Attending: Radiation Oncology | Admitting: Radiation Oncology

## 2021-03-23 ENCOUNTER — Encounter: Payer: Self-pay | Admitting: Radiation Oncology

## 2021-03-23 DIAGNOSIS — C7931 Secondary malignant neoplasm of brain: Secondary | ICD-10-CM

## 2021-03-23 DIAGNOSIS — C642 Malignant neoplasm of left kidney, except renal pelvis: Secondary | ICD-10-CM | POA: Diagnosis not present

## 2021-03-23 DIAGNOSIS — C649 Malignant neoplasm of unspecified kidney, except renal pelvis: Secondary | ICD-10-CM

## 2021-03-23 MED ORDER — PREDNISONE 50 MG PO TABS
ORAL_TABLET | ORAL | 0 refills | Status: DC
  Filled 2021-03-23: qty 3, 1d supply, fill #0

## 2021-03-23 NOTE — Progress Notes (Incomplete)
Has armband been applied?    Does patient have an allergy to IV contrast dye?: Yes   Has patient ever received premedication for IV contrast dye?:    Does patient take metformin?: No  If patient does take metformin when was the last dose: n/a  Date of lab work: 03/03/2021 BUN: 16 CR: 0.81 eGfr: >60    IV site: Right Chest Port    Has IV site been added to flowsheet?

## 2021-03-23 NOTE — Progress Notes (Addendum)
Spoke w/ patient's spouse "Phillip [name]", verified her identity and begin nursing interview. She states that patient "Phillip [name], has some chronic pain, that is worse in the RT leg but is being managed w/ Oxycodone. Otherwise he is doing okay." No other complaints at this time.  Meaningful use complete.  Spouse Phillip [name] notified of patient's Phillip [name] 3:30pm-03/23/21 telephone appointment w/ Shona Simpson PA-C. I left my extension [phone] in case patient needs to call.  Patient contact [phone]

## 2021-03-23 NOTE — Telephone Encounter (Signed)
Per Shona Simpson PA-C. I spoke w/ patient's spouse '[name] Wise', verified her identity and inquired about them wanting a phone call from Shona Simpson PA-C later this evening in reference to Mr. Day Deery care. I spoke w/ Bryson Ha and she has agreed to call the patient today 03/23/21 around 3:30pm. Ms. Cristela Blue states "That will be fine." I also inquired w/ spouse [name] about the current status of the patient's allergy to Diphenhydramine and Prednisone. She states that "Demon Volante is NOT allergic to Prednisone or Diphenhydramine. He must take these mediations prior to receiving any IV contrast dye, due to an allergy of IV contrast dye (rash)." I have taken this information and updated the patient's allergy list w/ a note stating why the change was made. I left my extension [phone] in case patient needs to call.

## 2021-03-23 NOTE — Progress Notes (Signed)
I connected with Phillip [name] on 03/23/21 at  2:30 PM EST by telephone visit and verified that I am speaking with the correct person using two identifiers.  I discussed the limitations, risks, security and privacy concerns of performing an evaluation and management service by telemedicine and the availability of in-person appointments. I also discussed with the patient that there may be a patient responsible charge related to this service. The patient expressed understanding and agreed to proceed.  Other persons participating in the visit and their role in the encounter:  spouse  Patient's location:  Home  Provider's location:  Office  Chief Complaint:  Brain metastases [facility]  History of Present Ilness: Phillip [name] describes confusion regarding upcoming treatment plans.  He describes no new or progressive deficits since prior visit.  No seizures, headaches. Observations: Language and cognition at baseline Assessment and Plan: Brain metastases ([place])  Again reviewed treatment options for progressive CNS lesions.  He is agreeable with SRS for small new lesion, and referral to [facility] for LITT evaluation for progressive lesion as described previously.   Follow Up Instructions: Will undergo CT sim for SRS planning.  Return to neuro onc clinic following LITT.  I discussed the assessment and treatment plan with the patient.  The patient was provided an opportunity to ask questions and all were answered.  The patient agreed with the plan and demonstrated understanding of the instructions.    The patient was advised to call back or seek an in-person evaluation if the symptoms worsen or if the condition fails to improve as anticipated.  I provided 5-10 minutes of non-face-to-face time during this enocunter.  [name], MD   I provided 15 minutes of non face-to-face telephone visit time during this encounter, and > 50% was spent counseling as documented under my assessment & plan.

## 2021-03-23 NOTE — Progress Notes (Signed)
Radiation Oncology         [phone] ________________________________  Reconsult Visit- Conducted via telephone due to current COVID-19 concerns for limiting patient exposure  I spoke with the patient to conduct this consult visit via telephone to spare the patient unnecessary potential exposure in the healthcare setting during the current COVID-19 pandemic. The patient was notified in advance and was offered a [place] meeting to allow for face to face communication but unfortunately reported that they did not have the appropriate resources/technology to support such a visit and instead preferred to proceed with a telephone visit.  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  Prior Radiation:   07/30/19 SRS Treatment: PTV8 RtCerebellum70mm 20 Gy in 1 fraction  03/19/19-03/25/19 SRS Treatment: PTV1 Lt Temporal 38mm 27 Gy in 3 fractions PTV2 Lt Occipital 101mm  27 Gy in 3 fractions PTV6 Lt Frontal 44mm  27 Gy in 3 fractions  03/19/19 SRS Treatment:  PTV3 Rt Occipital 76mm  PTV4 Lt Frontal 62mm   PTV5 Rt Frontal 64mm  PTV7 Lt Parietal 40mm  All of these were treated to 20 Gy in 1 fraction  12/02/2015 to 12/12/2015 SBRT Treatment:  The L4 Right spinal was treated to 50 Gy in 5 fractions at 10 Gy per fraction  Narrative: In summary this is a pleasant 76 y.o. 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 He did have a new punctate lesion in the superior right cerebellum  that was treated with SRS in June of 2021. He's been followed by Dr. Mickeal [name] in neuro oncology and continues with Dr. Benay [name]. He had progression of his systemic disease in the fall of 2022 and started Levatinib and Keytruda with Dr. Benay [name]. He had repeat imaging of the brain with MRI on 03/10/21 and this noted a new punctate lesion in  right frontal operculum. There were also progressive changes in the left frontal lesion with possible radionecrosis versus persistent tumor. He has been offered LITT to the left frontal lesion and is getting set up to be seen at a tertiary center. He's contacted to review the rationale for radiosurgery   On review of systems, the patient reports that he is doing well overall. He reports he is not having seizure activity, no headaches are noted either.   Past Medical History:  Past Medical History:  Diagnosis Date   Anxiety    Brain cancer ([place])    Chronic kidney disease    renal cancer   Constipation    Depression    Hypertension    Low testosterone in male    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   Seizures ([place])    last seizure 01/2019 - controlled since on keppra   Wears glasses     Past Surgical History: Past Surgical History:  Procedure Laterality Date   insertion port-a-cath  2018   IR GENERIC HISTORICAL  11/18/2015   IR FLUORO GUIDED NEEDLE PLC ASPIRATION/INJECTION LOC 11/18/2015 Phillip  Deveshwar, MD [facility]-INTERV RAD   IR GENERIC HISTORICAL  05/16/2016   IR FLUORO GUIDE PORT INSERTION RIGHT 05/16/2016 Phillip Cadet, MD [facility]-INTERV RAD   IR GENERIC HISTORICAL  05/16/2016   IR US GUIDE VASC ACCESS RIGHT 05/16/2016 Phillip Cadet, MD [facility]-INTER[facility] RAD   RADIOLOGY WITH ANESTHESIA N/A 06/16/2019   Procedure: MRI BRAIN WITH AND WITHOUT CONTRAST WITH ANESTHESIA;  Surgeon: Radiologist, Medication, MD;  Location: [facility];  Service: Radiology;  Laterality: N/A;    Social History:  Social History   Socioeconomic History   Marital status: Single     Spouse name: Not on file   Number of children: Not on file   Years of education: Not on file   Highest education level: Not on file  Occupational History   Not on file  Tobacco Use   Smoking status: Never   Smokeless tobacco: Never  Vaping Use   Vaping Use: Never used  Substance and Sexual Activity   Alcohol use: Not Currently    Alcohol/week: 1.0 standard drink    Types: 1 Glasses of wine per week   Drug use: Yes    Types: Marijuana    Comment: Last use 06/13/19   Sexual activity: Not Currently  Other Topics Concern   Not on file  Social History Narrative   Not on file   Social Determinants of Health   Financial Resource Strain: Not on file  Food Insecurity: Not on file  Transportation Needs: Not on file  Physical Activity: Not on file  Stress: Not on file  Social Connections: Not on file  Intimate Partner Violence: Not on file  The patient is in a relationship with Phillip [name]. She is a previous Veterinary surgeon within [employer]. He is retired and they live in [place]. He is retired from working as a Scientific laboratory technician.   Family History: Family History  Problem Relation Age of Onset   Cancer Grandchild        lymphoma    ALLERGIES:  is allergic to contrast media [iodinated contrast media].  Meds: Current Outpatient Medications  Medication Sig Dispense Refill   acetaminophen (TYLENOL) 650 MG CR tablet Take 650 mg by mouth every 8 (eight) hours as needed for pain.     COVID-19 mRNA bivalent vaccine, Pfizer, (PFIZER COVID-19 VAC BIVALENT) injection Inject into the muscle. (Patient not taking: Reported on 03/13/2021) 0.3 mL 0   COVID-19 mRNA Vac-TriS, Pfizer, (PFIZER-BIONT COVID-19 VAC-TRIS) SUSP injection Inject into the muscle. (Patient not taking: Reported on 03/07/2021) 0.3 mL 0   docusate sodium (COLACE) 100 MG capsule Take 100 mg by mouth 2 (two) times daily.     famotidine (PEPCID) 10 MG tablet Take 10 mg by mouth 2 (two) times daily.     hydrocortisone (CORTEF) 10 MG  tablet TAKE 2 TABLETS BY MOUTH IN THE MORNING AND 1 TABLET IN THE EVENING 90 tablet 3   ibuprofen (ADVIL,MOTRIN) 200 MG tablet Take 200 mg by mouth every 8 (eight) hours as needed for fever, headache, mild pain, moderate pain or cramping.      lenvatinib 18 mg daily dose (LENVIMA, 18 MG DAILY DOSE,) 10 MG & 2 x 4 MG capsule Take 18 mg by mouth daily. 90 capsule 0   levETIRAcetam (KEPPRA) 500 MG tablet TAKE 1 TABLET BY MOUTH 2 TIMES DAILY 60 tablet 2   lidocaine-prilocaine (EMLA) cream APPLY TO PORTACATH 1 HOUR PRIOR TO USE AS NEEDED 30 g 2   LORazepam (ATIVAN) 0.5 MG tablet Take 1-2 tablets (0.5-1  mg total) by mouth every 6 (six) hours as needed for anxiety. 60 tablet 2   losartan-hydrochlorothiazide (HYZAAR) 100-12.5 MG tablet Take 1 tablet by mouth once a day. 90 tablet 3   mirtazapine (REMERON) 30 MG tablet TAKE 1 TABLET BY MOUTH EVERY EVENING AT BEDTIME 90 tablet 5   Multiple Vitamin (MULTIVITAMIN ADULT PO) Take 1 tablet by mouth daily. [place] product     ondansetron (ZOFRAN) 8 MG tablet TAKE 1 TABLET BY MOUTH EVERY 8 HOURS AS NEEDED FOR NAUSEA AND VOMITING 90 tablet 2   oxyCODONE (OXY IR/ROXICODONE) 5 MG immediate release tablet Take 1 tablet by mouth every 6 hours as needed for severe pain. 60 tablet 0   oxyCODONE (OXYCONTIN) 10 mg 12 hr tablet TAKE 2 TABLETS BY MOUTH EVERY 12 HOURS. 120 tablet 0   polyethylene glycol (MIRALAX / GLYCOLAX) 17 g packet Take 17 g by mouth daily.     prochlorperazine (COMPAZINE) 10 MG tablet Take 1 tablet by mouth every 6 hours as needed for nausea or vomiting. 30 tablet 2   Testosterone 1.62 % GEL APPLY 2 PUMPS TO AREA ONCE A DAY AS DIRECTED 75 g 3   Testosterone 1.62 % GEL Apply 2 pumps topically daily 75 g 3   No current facility-administered medications for this encounter.   Facility-Administered Medications Ordered in Other Encounters  Medication Dose Route Frequency Provider Last Rate Last Admin   sodium chloride flush (NS) 0.9 % injection 10 mL  10 mL  Intravenous PRN Phillip Pier, MD   10 mL at 06/22/16 1610    Physical Findings: Unable to assess due to encounter type.  Lab Findings: Lab Results  Component Value Date   WBC 6.4 03/03/2021   HGB 12.1 (L) 03/03/2021   HCT 38.3 (L) 03/03/2021   MCV 88.5 03/03/2021   PLT 268 03/03/2021     Radiographic Findings: MR BRAIN W WO CONTRAST  Addendum Date: 03/13/2021   ADDENDUM REPORT: 03/13/2021 07:41 ADDENDUM: Case discussed at tumor board. Additional punctate lesion at the right frontal operculum on 12:94, not seen prior, please correlate with initial treatment plan to determine new versus recurrent enhancement. Electronically Signed   By: Jorje Guild M.D.   On: 03/13/2021 07:41   Result Date: 03/13/2021 CLINICAL DATA:  Brain metastases, assess treatment response, history of renal cell carcinoma EXAM: MRI HEAD WITHOUT AND WITH CONTRAST TECHNIQUE: Multiplanar, multiecho pulse sequences of the brain and surrounding structures were obtained without and with intravenous contrast. CONTRAST:  35mL MULTIHANCE GADOBENATE DIMEGLUMINE 529 MG/ML IV SOLN COMPARISON:  11/04/2020 FINDINGS: Brain: Previously noted metastatic lesions that have increased in size: - High left frontal lobe lesion now measures approximately 9 x 11 x 9 mm (AP x TR x CC) (series 12, image 142 and series 13, image 26), previously 8 x 7 x 6 mm when remeasured similarly. - Medial left frontal lobe lesion measures 19 x 16 x 23 mm (AP x TR x CC) (series 12, image 124 and series 13, image 32), previously 15 x 15 x 16 mm when remeasured similarly. Increased confluent T2 hyperintense signal is seen associated with the two left frontal lobe lesions. - Right lateral occipital lesion measures 8 x 5 x 6 mm (series 12, image 65 and series 13, image 7), previously 6 x 5 x 5 mm when remeasured similarly. - More superior and medial right occipital lesion measures 3 mm (series 12, image 73), previously 1-2 mm. Unchanged: - Left occipital lesion,  measuring 9 x 5  x 5 mm (series 12, image 49 and series 13, image 9). - 4 mm lesion in the medial left parietal lobe (series 12, image 105) - Punctate enhancing lesions in the left cerebellum (series 12, image 25), right cerebellum (series 12, image 43), and right frontal lobe (series 12, image 126) are unchanged. Previously noted enhancing focus in the medial left temporal lobe is no longer definitively seen. No definite new lesions. Redemonstrated punctate focus of restricted diffusion, intrinsic T1 hyperintense signal, and faint enhancement in the lateral left temporal lobe, associated with an area of encephalomalacia and gliosis, which is not favored to represent a metastatic lesion. Focus of restricted diffusion in the superior left frontal lobe, associated with the high left frontal lobe lesion (series 3, image 75 and medial left frontal lobe lesion (series 3, image 69). No foci of restricted diffusion to suggest acute or subacute infarct. No acute hemorrhage. No midline shift. No extra-axial collection or hydrocephalus. 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 T2 hyperintense signal in the periventricular white matter, likely the sequela of chronic small vessel ischemic disease. Vascular: Normal flow voids. Skull and upper cervical spine: Normal marrow signal. Sinuses/Orbits: Mild mucosal thickening in the paranasal sinuses. The orbits are unremarkable. Other: The mastoids are well aerated. IMPRESSION: 1. Interval increase in the size of several metastatic lesions, most prominently the high left and medial left frontal lobe lesions, which are associated with increasing T2 hyperintense signal, likely edema. Additional enhancing lesions in the right occipital lobe have also slightly increased in size. 2. The remainder of the previously noted lesions are either unchanged or, in the case of enhancing focus in the medial left temporal lobe, no longer seen. No new metastatic  lesions. Electronically Signed: By: Phillip [name] M.D. On: 03/10/2021 15:06   CT CHEST ABDOMEN PELVIS W CONTRAST  Result Date: 03/03/2021 CLINICAL DATA:  Renal cell carcinoma.  Restaging. EXAM: CT CHEST, ABDOMEN, AND PELVIS WITH CONTRAST TECHNIQUE: Multidetector CT imaging of the chest, abdomen and pelvis was performed following the standard protocol during bolus administration of intravenous contrast. CONTRAST:  143mL OMNIPAQUE IOHEXOL 300 MG/ML  SOLN COMPARISON:  11/01/2020 FINDINGS: CT CHEST FINDINGS Cardiovascular: Normal heart size. No pericardial effusion. There is aortic atherosclerosis with coronary artery calcifications. Mediastinum/Nodes: No enlarged mediastinal,, supraclavicular or axillary lymph nodes. Left hilar lymph node measures 1.2 cm, image 26/2. Unchanged from previous exam. Thyroid gland, trachea, nd esophagus demonstrate no significant findings. Lungs/Pleura: No pleural effusion identified. Interval resolution of left upper lobe perihilar nodularity. Peripheral right upper lobe lung nodule measures 4 mm and is unchanged from previous exam. Musculoskeletal: Metastatic lesion involving the T9 vertebral body is unchanged in appearance from previous exam, image 74/6. Multiple lytic bone lesions are also noted involving the ribs and appears stable in the interval. CT ABDOMEN PELVIS FINDINGS Hepatobiliary: Multifocal liver metastases are identified. Index lesion in right hepatic lobe measures 2.8 x 1.6 cm, image 55/2. Previously 2.4 x 1.6 cm. Index lesion within the central aspect of the left hepatic lobe measures 1.5 cm, image 54/2. Unchanged from previous exam. Segment 2/3 lesion in lateral segment of left hepatic lobe measures 1 cm, image 56/2. Previously 0.5 cm. Index lesion within segment 4 adjacent to the falciform ligament measures 0.9 cm, image 56/2. Unchanged from previous exam. Lesion in segment 4 B adjacent to the gallbladder fossa measures 0.9 cm, image 59/2. Previously 0.6 cm. Lesion  within segment 7 measures 0.8 cm, image 54/2. New from previous exam. Lesion  within segment 6 measures 6 mm and is also new in the interval, image 65/2. Gallstones are again identified.  No biliary dilatation. Pancreas: Unremarkable. No pancreatic ductal dilatation or surrounding inflammatory changes. Spleen: Normal in size without focal abnormality. Adrenals/Urinary Tract: Bilateral adrenal nodules are again noted. Index nodule in the right adrenal gland measures 1.2 cm, image 61/2. Decreased from 2 cm previously. Also arising off the left adrenal gland is a 0.9 cm nodule, image 63/2. Previously this measured 1.5 cm. The right adrenal nodule measures 1 cm, image 65/2. Previously 1.3 cm. The left kidney mass measures 3 x 3.4 cm, image 67/2. Formally this measured 4.0 x 3.8 cm. No new kidney mass or hydronephrosis identified. The bladder appears decompressed. Enhancing nodule previously seen within the left bladder is not confidently identified on today's study Stomach/Bowel: Stomach appears normal. The appendix appears mildly thickened measuring 9 mm. This is compared with the same previously no significant bowel wall thickening, inflammation, or distension. Vascular/Lymphatic: Aortic atherosclerosis. No aneurysm. Index hepatic of duodenal ligament lymph node measures 1.1 cm short axis, image 65/2. Formally 1.4 cm. Reproductive: Prostate gland is enlarged.  identified. Other: No free fluid or fluid collections. Musculoskeletal: Multifocal areas of bone metastases are again noted involving the bony pelvis, sacrum and lumbar spine. These appears stable from previous exam. IMPRESSION: 1. The left kidney lesion is decreased in size in the interval. Additionally, bilateral adrenal nodules have decreased in size in the interval. 2. Multifocal liver metastases are again noted. Several of these lesions show mild increased in size in the interval. There also several new small lesions within the right hepatic lobe. 3.  Unchanged appearance of multifocal bone metastases. 4. Left upper lobe perihilar nodule has resolved in the interval. Adjacent left hilar lymph node is stable. There are no new sites of disease within the chest. 5. Previously described enhancing nodule previously seen within the left bladder is not confidently identified on today's study. 6. Aortic Atherosclerosis (ICD10-I70.0). Electronically Signed   By: Phillip [name] M.D.   On: 03/03/2021 20:49     Impression/Plan: 1. 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   Dr. Lisbeth [name] reviews the MRI findings and the rationale for stereotactic radiosurgery to the punctate lesion in right frontal operculum. We discussed the risks, benefits, short, and long term effects of radiotherapy, as well as the curative intent, and the patient is interested in proceeding. Dr. Lisbeth [name] discusses the delivery and logistics of radiotherapy and anticipates a course of one fraction of SRS treatment to the right frontal operculum. The patient would like to delay treatment one week of radiotherapy. He will be contacted by our special procedures team to reschedule his simulation and subsequent treatment. He will keep his appointment with Dr. Marcello [name] in neurosurgery in lieu of Dr. Vertell [name] being unavailable, and will also keep plans for evaluation at [facility] with Dr. Salomon [name] for LITT. He will sign written consent when he comes for simulation. 2. Claustrophobia. The patient has Ativan and is aware to take this 30 minutes prior to MRI, simulation, and treatment. 3. IV Contrast sensitivity.  We discussed the need for IV contrast with premedication with prednisone and benadryl. He is aware of the and a new rx will be sent into his pharmacy. 4. Seizure activity. The patient has followed with Dr. Mickeal [name] and will continue to be followed in surveillance of this.   Given current concerns for patient exposure during the COVID-19 pandemic, this encounter was conducted via  telephone.  The patient has provided two factor identification and has given verbal consent for this type of encounter and has been advised to only accept a meeting of this type in a secure network environment. The time spent during this encounter was 45 minutes including preparation, discussion, and coordination of the patient's care. The attendants for this meeting include  Dr. Lisbeth [name], Phillip [name]  and Phillip [name] and his partner Phillip [name]. During the encounter, Dr. Lisbeth [name], and Phillip [name] were located at [facility] Radiation Oncology Department.  Phillip [name] was located at home with his partner Phillip [name].       [name], PAC

## 2021-03-23 NOTE — Telephone Encounter (Signed)
I called the patient and his partner [name] back but no answer. We will try again later today.

## 2021-03-24 ENCOUNTER — Ambulatory Visit: Payer: PPO | Admitting: Radiation Oncology

## 2021-03-26 ENCOUNTER — Other Ambulatory Visit: Payer: Self-pay | Admitting: Oncology

## 2021-03-27 ENCOUNTER — Other Ambulatory Visit (HOSPITAL_COMMUNITY): Payer: Self-pay

## 2021-03-27 MED FILL — Lenvatinib Cap Ther Pack 10 MG & 2 x 4 MG (18 MG Daily Dose): ORAL | 30 days supply | Qty: 90 | Fill #0 | Status: CN

## 2021-03-27 MED FILL — Lenvatinib Cap Ther Pack 10 MG & 2 x 4 MG (18 MG Daily Dose): ORAL | 30 days supply | Qty: 90 | Fill #0 | Status: AC

## 2021-03-28 ENCOUNTER — Other Ambulatory Visit: Payer: Self-pay

## 2021-03-28 ENCOUNTER — Inpatient Hospital Stay (HOSPITAL_BASED_OUTPATIENT_CLINIC_OR_DEPARTMENT_OTHER): Payer: PPO | Admitting: Oncology

## 2021-03-28 ENCOUNTER — Inpatient Hospital Stay: Payer: PPO

## 2021-03-28 ENCOUNTER — Encounter: Payer: Self-pay | Admitting: *Deleted

## 2021-03-28 ENCOUNTER — Other Ambulatory Visit (HOSPITAL_COMMUNITY): Payer: Self-pay

## 2021-03-28 VITALS — BP 137/71 | HR 58 | Temp 98.1°F | Resp 20 | Ht 70.0 in | Wt 226.0 lb

## 2021-03-28 VITALS — BP 139/57 | HR 48 | Resp 18

## 2021-03-28 DIAGNOSIS — C649 Malignant neoplasm of unspecified kidney, except renal pelvis: Secondary | ICD-10-CM

## 2021-03-28 DIAGNOSIS — Z5112 Encounter for antineoplastic immunotherapy: Secondary | ICD-10-CM | POA: Diagnosis not present

## 2021-03-28 DIAGNOSIS — C642 Malignant neoplasm of left kidney, except renal pelvis: Secondary | ICD-10-CM | POA: Diagnosis not present

## 2021-03-28 LAB — CBC WITH DIFFERENTIAL (CANCER CENTER ONLY)
Abs Immature Granulocytes: 0.03 10*3/uL (ref 0.00–0.07)
Basophils Absolute: 0.1 10*3/uL (ref 0.0–0.1)
Basophils Relative: 1 %
Eosinophils Absolute: 0.8 10*3/uL — ABNORMAL HIGH (ref 0.0–0.5)
Eosinophils Relative: 9 %
HCT: 37.4 % — ABNORMAL LOW (ref 39.0–52.0)
Hemoglobin: 11.6 g/dL — ABNORMAL LOW (ref 13.0–17.0)
Immature Granulocytes: 0 %
Lymphocytes Relative: 19 %
Lymphs Abs: 1.8 10*3/uL (ref 0.7–4.0)
MCH: 27.5 pg (ref 26.0–34.0)
MCHC: 31 g/dL (ref 30.0–36.0)
MCV: 88.6 fL (ref 80.0–100.0)
Monocytes Absolute: 0.9 10*3/uL (ref 0.1–1.0)
Monocytes Relative: 9 %
Neutro Abs: 5.8 10*3/uL (ref 1.7–7.7)
Neutrophils Relative %: 62 %
Platelet Count: 268 10*3/uL (ref 150–400)
RBC: 4.22 MIL/uL (ref 4.22–5.81)
RDW: 14.9 % (ref 11.5–15.5)
WBC Count: 9.3 10*3/uL (ref 4.0–10.5)
nRBC: 0 % (ref 0.0–0.2)

## 2021-03-28 LAB — CMP (CANCER CENTER ONLY)
ALT: 24 U/L (ref 0–44)
AST: 19 U/L (ref 15–41)
Albumin: 3.8 g/dL (ref 3.5–5.0)
Alkaline Phosphatase: 82 U/L (ref 38–126)
Anion gap: 8 (ref 5–15)
BUN: 21 mg/dL (ref 8–23)
CO2: 28 mmol/L (ref 22–32)
Calcium: 9.1 mg/dL (ref 8.9–10.3)
Chloride: 102 mmol/L (ref 98–111)
Creatinine: 0.9 mg/dL (ref 0.61–1.24)
GFR, Estimated: >60 mL/min (ref >60)
Glucose, Bld: 100 mg/dL — ABNORMAL HIGH (ref 70–99)
Potassium: 4.3 mmol/L (ref 3.5–5.1)
Sodium: 138 mmol/L (ref 135–145)
Total Bilirubin: 0.3 mg/dL (ref 0.3–1.2)
Total Protein: 6.7 g/dL (ref 6.5–8.1)

## 2021-03-28 LAB — TOTAL PROTEIN, URINE DIPSTICK

## 2021-03-28 MED ORDER — VITAMIN B-12 1000 MCG PO TABS: 1000.0000 ug | ORAL_TABLET | Freq: Every day | ORAL | Status: DC

## 2021-03-28 MED ORDER — SODIUM CHLORIDE 0.9 % IV SOLN: Freq: Once | INTRAVENOUS | Status: AC

## 2021-03-28 MED ORDER — SODIUM CHLORIDE 0.9 % IV SOLN
200.0000 mg | Freq: Once | INTRAVENOUS | Status: AC
  Administered 2021-03-28: 200 mg via INTRAVENOUS
  Filled 2021-03-28: qty 8

## 2021-03-28 MED ORDER — SODIUM CHLORIDE 0.9% FLUSH
10.0000 mL | INTRAVENOUS | Status: DC | PRN
  Administered 2021-03-28: 10 mL

## 2021-03-28 MED ORDER — HEPARIN SOD (PORK) LOCK FLUSH 100 UNIT/ML IV SOLN
500.0000 [IU] | Freq: Once | INTRAVENOUS | Status: AC | PRN
  Administered 2021-03-28: 500 [IU]

## 2021-03-28 NOTE — Patient Instructions (Signed)

## 2021-03-28 NOTE — Progress Notes (Signed)
Patient seen by Dr. Sherrill today ? ?Vitals are within treatment parameters. ? ?Labs reviewed by Dr. Sherrill and are within treatment parameters. ? ?Per physician team, patient is ready for treatment and there are NO modifications to the treatment plan.  ?

## 2021-03-28 NOTE — Patient Instructions (Signed)
Start OTC Vitamin B12 taking 1000 mcg twice daily x 3 days, then daily.

## 2021-03-28 NOTE — Patient Instructions (Signed)
[facility]   °Discharge Instructions: °Thank you for choosing [facility] to provide your oncology and hematology care.  ° °If you have a lab appointment with the Cancer Center, please go directly to the Cancer Center and check in at the registration area. °  °Wear comfortable clothing and clothing appropriate for easy access to any Portacath or PICC line.  ° °We strive to give you quality time with your provider. You may need to reschedule your appointment if you arrive late (15 or more minutes).  Arriving late affects you and other patients whose appointments are after yours.  Also, if you miss three or more appointments without notifying the office, you may be dismissed from the clinic at the provider’s discretion.    °  °For prescription refill requests, have your pharmacy contact our office and allow 72 hours for refills to be completed.   ° °Today you received the following chemotherapy and/or immunotherapy agents Pembrolizumab (KEYTRUDA).    °  °To help prevent nausea and vomiting after your treatment, we encourage you to take your nausea medication as directed. ° °BELOW ARE SYMPTOMS THAT SHOULD BE REPORTED IMMEDIATELY: °*FEVER GREATER THAN 100.4 F (38 °C) OR HIGHER °*CHILLS OR SWEATING °*NAUSEA AND VOMITING THAT IS NOT CONTROLLED WITH YOUR NAUSEA MEDICATION °*UNUSUAL SHORTNESS OF BREATH °*UNUSUAL BRUISING OR BLEEDING °*URINARY PROBLEMS (pain or burning when urinating, or frequent urination) °*BOWEL PROBLEMS (unusual diarrhea, constipation, pain near the anus) °TENDERNESS IN MOUTH AND THROAT WITH OR WITHOUT PRESENCE OF ULCERS (sore throat, sores in mouth, or a toothache) °UNUSUAL RASH, SWELLING OR PAIN  °UNUSUAL VAGINAL DISCHARGE OR ITCHING  ° °Items with * indicate a potential emergency and should be followed up as soon as possible or go to the Emergency Department if any problems should occur. ° °Please show the CHEMOTHERAPY ALERT CARD or IMMUNOTHERAPY ALERT CARD  at check-in to the Emergency Department and triage nurse. ° °Should you have questions after your visit or need to cancel or reschedule your appointment, please contact [facility]  Dept: [phone]  and follow the prompts.  Office hours are 8:00 a.m. to 4:30 p.m. Monday - Friday. Please note that voicemails left after 4:00 p.m. may not be returned until the following business day.  We are closed weekends and major holidays. You have access to a nurse at all times for urgent questions. Please call the main number to the clinic Dept: [phone] and follow the prompts. ° ° °For any non-urgent questions, you may also contact your provider using MyChart. We now offer e-Visits for anyone 18 and older to request care online for non-urgent symptoms. For details visit [email or web address]. °  °Also download the MyChart app! Go to the app store, search "MyChart", open the app, select [facility], and log in with your MyChart username and password. ° °Due to Covid, a mask is required upon entering the hospital/clinic. If you do not have a mask, one will be given to you upon arrival. For doctor visits, patients may have 1 support person aged 18 or older with them. For treatment visits, patients cannot have anyone with them due to current Covid guidelines and our immunocompromised population.  ° °Pembrolizumab injection °What is this medication? °PEMBROLIZUMAB (pem broe liz ue mab) is a monoclonal antibody. It is used to treat certain types of cancer. °This medicine may be used for other purposes; ask your health care provider or pharmacist if you have questions. °COMMON BRAND NAME(S):   Keytruda °What should I tell my care team before I take this medication? °They need to know if you have any of these conditions: °autoimmune diseases like Crohn's disease, ulcerative colitis, or lupus °have had or planning to have an allogeneic stem cell transplant (uses someone else's stem cells) °history of  organ transplant °history of chest radiation °nervous system problems like myasthenia gravis or Guillain-Barre syndrome °an unusual or allergic reaction to pembrolizumab, other medicines, foods, dyes, or preservatives °pregnant or trying to get pregnant °breast-feeding °How should I use this medication? °This medicine is for infusion into a vein. It is given by a health care professional in a hospital or clinic setting. °A special MedGuide will be given to you before each treatment. Be sure to read this information carefully each time. °Talk to your pediatrician regarding the use of this medicine in children. While this drug may be prescribed for children as young as 6 months for selected conditions, precautions do apply. °Overdosage: If you think you have taken too much of this medicine contact a poison control center or emergency room at once. °NOTE: This medicine is only for you. Do not share this medicine with others. °What if I miss a dose? °It is important not to miss your dose. Call your doctor or health care professional if you are unable to keep an appointment. °What may interact with this medication? °Interactions have not been studied. °This list may not describe all possible interactions. Give your health care provider a list of all the medicines, herbs, non-prescription drugs, or dietary supplements you use. Also tell them if you smoke, drink alcohol, or use illegal drugs. Some items may interact with your medicine. °What should I watch for while using this medication? °Your condition will be monitored carefully while you are receiving this medicine. °You may need blood work done while you are taking this medicine. °Do not become pregnant while taking this medicine or for 4 months after stopping it. Women should inform their doctor if they wish to become pregnant or think they might be pregnant. There is a potential for serious side effects to an unborn child. Talk to your health care professional or  pharmacist for more information. Do not breast-feed an infant while taking this medicine or for 4 months after the last dose. °What side effects may I notice from receiving this medication? °Side effects that you should report to your doctor or health care professional as soon as possible: °allergic reactions like skin rash, itching or hives, swelling of the face, lips, or tongue °bloody or black, tarry °breathing problems °changes in vision °chest pain °chills °confusion °constipation °cough °diarrhea °dizziness or feeling faint or lightheaded °fast or irregular heartbeat °fever °flushing °joint pain °low blood counts - this medicine may decrease the number of white blood cells, red blood cells and platelets. You may be at increased risk for infections and bleeding. °muscle pain °muscle weakness °pain, tingling, numbness in the hands or feet °persistent headache °redness, blistering, peeling or loosening of the skin, including inside the mouth °signs and symptoms of high blood sugar such as dizziness; dry mouth; dry skin; fruity breath; nausea; stomach pain; increased hunger or thirst; increased urination °signs and symptoms of kidney injury like trouble passing urine or change in the amount of urine °signs and symptoms of liver injury like dark urine, light-colored stools, loss of appetite, nausea, right upper belly pain, yellowing of the eyes or skin °sweating °swollen lymph nodes °weight loss °Side effects that usually do not   require medical attention (report to your doctor or health care professional if they continue or are bothersome): °decreased appetite °hair loss °tiredness °This list may not describe all possible side effects. Call your doctor for medical advice about side effects. You may report side effects to FDA at 1-800-FDA-1088. °Where should I keep my medication? °This drug is given in a hospital or clinic and will not be stored at home. °NOTE: This sheet is a summary. It may not cover all possible  information. If you have questions about this medicine, talk to your doctor, pharmacist, or health care provider. °© 2022 [employer]vier/Gold Standard (2020-11-01 00:00:00) ° °

## 2021-03-28 NOTE — Progress Notes (Signed)
Patient presents for treatment. RN assessment completed along with the following:  Labs/vitals reviewed - Yes, and within treatment parameters.   Weight within 10% of previous measurement - Yes Informed consent completed and reflects current therapy/intent - Yes, on date 11/30/20             Provider progress note reviewed - Today's provider note is not yet available. I reviewed the most recent oncology provider progress note in chart dated 03/07/21. Treatment/Antibody/Supportive plan reviewed - Yes, and there are no adjustments needed for today's treatment. S&H and other orders reviewed - Yes, and there are no additional orders identified. Previous treatment date reviewed - Yes, and the appropriate amount of time has elapsed between treatments. Clinic Hand Off Received from - Merceda Elks, RN  Patient to proceed with treatment.

## 2021-03-28 NOTE — Progress Notes (Signed)
Phillip [name] OFFICE PROGRESS NOTE   Diagnosis: Renal cell carcinoma  INTERVAL HISTORY:   Phillip [name] returns as scheduled.  He continues lenvatinib and pembrolizumab.  No rash or diarrhea.  He has increased somnolence and malaise since decreasing the hydrocortisone dose to 10 mg in the morning.  He continues oxycodone for pain. He underwent a restaging brain MRI 03/10/2021.  This revealed a progressive left frontal metastasis and a tiny focus of enhancement in the right frontal lobe consistent with a new metastasis.  Objective:  Vital signs in last 24 hours:  Blood pressure 137/71, pulse (!) 58, temperature 98.1 F (36.7 C), temperature source Oral, resp. rate 20, height 5\' 10"  (1.778 m), weight 226 lb (102.5 kg), SpO2 100 %.    HEENT: No thrush or ulcers Resp: Clear bilaterally Cardio: Regular rate and rhythm GI: No hepatosplenomegaly Vascular: No leg edema  Skin: No rash  Portacath/PICC-without erythema  Lab Results:  Lab Results  Component Value Date   WBC 9.3 03/28/2021   HGB 11.6 (L) 03/28/2021   HCT 37.4 (L) 03/28/2021   MCV 88.6 03/28/2021   PLT 268 03/28/2021   NEUTROABS 5.8 03/28/2021    CMP  Lab Results  Component Value Date   NA 138 03/28/2021   K 4.3 03/28/2021   CL 102 03/28/2021   CO2 28 03/28/2021   GLUCOSE 100 (H) 03/28/2021   BUN 21 03/28/2021   CREATININE 0.90 03/28/2021   CALCIUM 9.1 03/28/2021   PROT 6.7 03/28/2021   ALBUMIN 3.8 03/28/2021   AST 19 03/28/2021   ALT 24 03/28/2021   ALKPHOS 82 03/28/2021   BILITOT 0.3 03/28/2021   GFRNONAA >60 03/28/2021   GFRAA >60 11/05/2019    Lab Results  Component Value Date   CEA1 1.77 01/04/2016    Lab Results  Component Value Date   INR 0.98 05/16/2016   LABPROT 13.0 05/16/2016    Imaging:  No results found.  Medications: I have reviewed the patient's current medications.   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, L4  nerve compression Biopsy of the L4 mass 11/18/2015 confirmed metastatic renal cell carcinoma, clear cell type CTs of the chest, abdomen, and pelvis 11/18/2015-right lower lobe nodule, expansile lytic lesion at the right 11th rib/costal vertebral junction, left renal mass, expansile lesion involving the L4 vertebra, lytic lesion at the left acetabulum, and a low-attenuation liver lesion Initiation of SRS to L4 12/02/2015, Completed 12/12/2015 Initiation of Pazopanib 12/30/2015 Pazopanib placed on hold 02/06/2016 secondary to elevated liver enzymes Pazopanib resumed 03/07/2016 at a dose of 400 mg daily  Pazopanib discontinued 03/19/2016 secondary to elevated liver enzymes Restaging CTs 04/02/2016-stable left renal mass, decreased soft tissue component associated with the L4 metastasis, increased soft tissue component associated with the right 11th rib metastasis with increased T11 bony destruction, increased sclerosis at the left acetabulum lesion Cycle 1 nivolumab 04/12/2016 Cycle 2 nivolumab 04/26/2016 Cycle 3 nivolumab 05/11/2016 Cycle 4 nivolumab 05/24/2016 Cycle 5 nivolumab 06/08/2016 MRI lumbar spine 06/21/2016-unchanged tumor at L3, increased size of retroperitoneal lymph nodes compared to a CT from 04/02/2016 Cycle 6 nivolumab  06/22/2016 CTs chest, abdomen, and pelvis 07/04/2016-enlargement of the left renal mass, right adrenal nodule, left hilar and peritoneal lymph nodes, enlargement of left acetabular lesion. Stable lung nodules. Cycle 7 nivolumab 07/06/2016 Cycle 8 nivolumab 07/20/2016 Cycle 9 nivolumab 08/02/2016 Cycle 10 nivolumab 08/20/2016 Restaging CT 09/03/2016 evaluation with stable disease Cycle 11 nivolumab 09/05/2016 Cycle 12 nivolumab 09/19/2016 Cycle 13 nivolumab 10/03/2016  Cycle 14 nivolumab 10/17/2016 Cycle 15 nivolumab 10/31/2016 Cycle 16 nivolumab 11/14/2016 (changed to monthly schedule) Cycle 17 nivolumab 12/19/2016 CTs 01/21/2017-increased left renal mass,  increased size of adrenal metastases, increased lytic bone lesions, increased left lung nodule, persistent tumor at L4 with probable epidural component Initiation of Cabozantinib 01/28/2017 Restaging CTs 05/30/2017- decreased size of left hilar mass, left renal mass, retroperitoneal adenopathy, and adrenal metastasis.  Healing bone lesions. Cabozantinib continued CTs 10/21/2017- interval enlargement left hilar lymph node; stable rib lesions; stable mass left renal cortex; stable mildly nodular adrenal glands; stable lytic lesions within the pelvis and spine. Cabozantinib continued CTs 02/21/2018- enlargement of an AP window lymph node.  Mildly enlarged left hilar lymph node is unchanged.  Primary renal cell carcinoma involving the upper pole of the left kidney appears similar.  Stable enlarged right periaortic lymph node adjacent to the renal vessels.  Stable multifocal bony metastatic disease. Cabozantinib continued CTs 07/29/2018- stable 15 mm AP window nodes; stable left hilar node; subcarinal node slightly larger; right periaortic node 16 mm, previously 12 mm; portacaval node 24 mm, previously 16 mm; 15 mm node superior to the pancreatic head has enlarged; interval increase nodularity of both adrenal glands; stable left kidney mass; multifocal bony metastatic disease not significantly changed. Cabozantinib continued CTs 11/06/2018- moderate improvement in thoracic adenopathy; minimal improvement abdominal adenopathy; left kidney upper pole mass and various lytic expansile bone lesions stable; mild increase in nodularity of left adrenal gland; right adrenal gland nodularity stable. 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  Peripheral enhancing masses consistent with metastases. No hemorrhage. MRI brain 03/11/2019-7 enhancing brain masses consistent with metastatic disease SRS to 7 brain lesions,  treatment given 03/19/2019, 03/23/2019, and 03/25/2019 CTs 04/09/2019-decrease in left renal mass, bilateral adrenal nodules, AP window and porta hepatic adenopathy.  New 9 mm right lower lobe nodule.  Improved lytic lesion of the left acetabulum.  Other bone lesions are stable. Cabozantinib continued MRI brain 07/17/2019-resolution of 4 mm treated lesion in the right frontal cortex, 6 remaining treated lesions have decreased in size, new punctate metastasis in the superior right cerebellum SRS to right cerebellar lesion 07/30/2019 CTs 08/17/2019-previously noted right lower lobe nodule resolved, stable left kidney mass, mixed lytic/sclerotic bone lesions in the thoracolumbar spine, right posterior ribs, and left acetabulum-unchanged, no evidence of progressive disease  Cabozantinib continued MRI brain 10/23/2019-stable to slight decrease in size of multiple enhancing intracranial lesions.  Slight increase in surrounding edema in the medial left frontal lobe.  No new lesions present. MRI brain 01/29/2020-multiple enhancing brain lesions, some hemorrhagic, some with mild enlargement-treatment effect? CTs 02/08/2020-no thoracic metastases, enlargement of the left renal mass, enlargement of lytic lesion at T9, other lytic lesions unchanged MRI brain 05/04/2020-no new lesions, slight enlargement of a right occipital lesion, other lesions are stable or decreased in size CTs 08/11/2020-enlargement of left kidney mass, new 1.6 cm segment 7 liver lesion, enlargement of a lytic lesion at T9, increased lysis of a sclerotic lesion at the right second rib, 0.7 cm endoluminal nodule at the bladder dome-enlarged Brain MRI 08/10/2020-slight increase in a 15 mm left frontal lobe lesion, new punctate focus in the right occipital cortex, other lesions stable or slightly decreased CTs 11/01/2020-increase in left suprahilar opacity, enlargement of previous liver metastases, several new subcentimeter lesions, increase in left renal mass,  similar appearance of bone metastases, stable hyperdense/enhancing nodule in the left bladder Brain MRI 11/04/2020-no change in brain  metastases Lenvatinib/pembrolizumab 11/30/2020 CTs 03/03/2021-stable left hilar lymph nodes; resolution of left upper lobe perihilar nodularity; unchanged for millimeter peripheral right upper lobe nodule; multiple lytic bone lesions appear stable; left kidney lesion decreased in size; bilateral adrenal nodules decreased in size; several liver lesion showed mild increase in size; several new small lesions within the right hepatic lobe. Lenvatinib/pembrolizumab continued 03/07/2021 Brain MRI 03/10/2021-progression of a left frontal metastasis, tiny new metastasis in the right frontal lobe-referred for SBRT to the right frontal lesion and to [facility] to consider a LITT procedure for the progressive left frontal lesion Pain secondary to #1-managed by Dr. Lovenia Shuck.  Improved Hypertension Elevated transaminases 02/06/2016- Pazopanib placed on hold Liver enzymes normal 03/07/2016 Port-A-Cath placement 05/16/2016 Malaise/anorexia 09/05/2016. Cortisol and testosterone levels low. Hydrocortisone and testosterone replacement initiated. Conjunctival/scleral erythema 09/19/2016-resolved with steroid eyedrops Proximal right leg weakness. 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 Hypercalcemia status post Zometa 01/23/2017-resolved Pruritic rash following IV contrast 10/21/2017; rash following IV contrast 02/21/2018 despite prednisone/Benadryl premedication Brief episodes of expressive aphasia October and December 2020 Bite and B12 deficiency confirmed on lab 03/07/2021-started oral vitamin B12 replacement 03/28/2021        Disposition: Phillip [name] continues systemic therapy with lenvatinib and pembrolizumab.  He has increased malaise after decreasing the hydrocortisone replacement dose.  He will resume hydrocortisone at a dose of 20 mg in the morning  and 10 mg in the evening.  He will complete another cycle of pembrolizumab today.  He was found to have disease progression on a brain MRI 03/10/2021.  He has been referred for [facility], and LITT therapy.  Phillip [name] will return for an office visit in 3 weeks.  He will begin vitamin B12 replacement.  He will contact Dr. Inda Merlin to discuss vitamin D therapy.  Betsy Coder, MD  03/28/2021  12:54 PM

## 2021-03-30 ENCOUNTER — Ambulatory Visit: Payer: PPO | Admitting: Radiation Oncology

## 2021-03-30 ENCOUNTER — Ambulatory Visit: Payer: PPO

## 2021-03-31 ENCOUNTER — Ambulatory Visit: Payer: PPO

## 2021-03-31 ENCOUNTER — Ambulatory Visit
Admission: RE | Admit: 2021-03-31 | Discharge: 2021-03-31 | Disposition: A | Discharge status: Home / Self Care | Payer: PPO | Source: Ambulatory Visit | Attending: Radiation Oncology | Admitting: Radiation Oncology

## 2021-03-31 ENCOUNTER — Other Ambulatory Visit: Payer: Self-pay

## 2021-03-31 VITALS — BP 142/75 | HR 98 | Resp 20

## 2021-03-31 DIAGNOSIS — C7931 Secondary malignant neoplasm of brain: Secondary | ICD-10-CM | POA: Diagnosis not present

## 2021-03-31 DIAGNOSIS — C642 Malignant neoplasm of left kidney, except renal pelvis: Secondary | ICD-10-CM | POA: Diagnosis not present

## 2021-03-31 MED ORDER — SODIUM CHLORIDE 0.9% FLUSH
10.0000 mL | Freq: Once | INTRAVENOUS | Status: AC
  Administered 2021-03-31: 10 mL via INTRAVENOUS

## 2021-03-31 MED ORDER — HEPARIN SOD (PORK) LOCK FLUSH 100 UNIT/ML IV SOLN
500.0000 [IU] | Freq: Once | INTRAVENOUS | Status: AC
  Administered 2021-03-31: 500 [IU] via INTRAVENOUS

## 2021-03-31 NOTE — Progress Notes (Signed)
Has armband been applied?  Yes   Does patient have an allergy to IV contrast dye?: Yes.     Has patient ever received premedication for IV contrast dye?: Yes.   Prednisone    Does patient take metformin?: No.   If patient does take metformin when was the last dose: n/a   Date of lab work: 03/28/2021 BUN: 21 CR: 0.90 eGfr:>60    IV site: Right Chest Port   Has IV site been added to flowsheet?  Yes  BP (!) 142/75 (BP Location: Left Arm)    Pulse 98    Resp 20    SpO2 98%

## 2021-03-31 NOTE — Progress Notes (Incomplete)
Has armband been applied?    Does patient have an allergy to IV contrast dye?: Yes.     Has patient ever received premedication for IV contrast dye?: Yes.   Prednisone   Does patient take metformin?: No.  If patient does take metformin when was the last dose: n/a  Date of lab work: 03/28/2021 BUN: 21 CR: 0.90 eGfr:>60   IV site: Chest Port  Has IV site been added to flowsheet?

## 2021-04-03 ENCOUNTER — Encounter: Payer: Self-pay | Admitting: Oncology

## 2021-04-03 DIAGNOSIS — C7931 Secondary malignant neoplasm of brain: Secondary | ICD-10-CM | POA: Diagnosis not present

## 2021-04-03 DIAGNOSIS — C649 Malignant neoplasm of unspecified kidney, except renal pelvis: Secondary | ICD-10-CM | POA: Diagnosis not present

## 2021-04-04 DIAGNOSIS — C7931 Secondary malignant neoplasm of brain: Secondary | ICD-10-CM | POA: Diagnosis not present

## 2021-04-04 DIAGNOSIS — C642 Malignant neoplasm of left kidney, except renal pelvis: Secondary | ICD-10-CM | POA: Diagnosis not present

## 2021-04-04 NOTE — Progress Notes (Signed)
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  Intent: Palliative  Radiation Treatment Dates: 04/06/2021 through 04/06/2021 Site Technique Total Dose (Gy) Dose per Fx (Gy) Completed Fx Beam Energies  Brain: Brain 3D 20/20 20 1/1 6XFFF   Narrative: The patient tolerated radiation therapy relatively well.   Plan: The patient will receive a call in about one month from the radiation oncology department. He will continue follow up with Dr. Mickeal Skinner and [name]. He is also awaiting appointments with Dr. Salomon Fick at [facility] as well.   ________________________________________________    [name], [facility]

## 2021-04-05 ENCOUNTER — Other Ambulatory Visit: Payer: Self-pay | Admitting: Nurse Practitioner

## 2021-04-05 ENCOUNTER — Other Ambulatory Visit (HOSPITAL_COMMUNITY): Payer: Self-pay

## 2021-04-06 ENCOUNTER — Encounter: Payer: Self-pay | Admitting: Radiation Oncology

## 2021-04-06 ENCOUNTER — Other Ambulatory Visit: Payer: Self-pay

## 2021-04-06 ENCOUNTER — Other Ambulatory Visit (HOSPITAL_COMMUNITY): Payer: Self-pay

## 2021-04-06 ENCOUNTER — Ambulatory Visit
Admission: RE | Admit: 2021-04-06 | Discharge: 2021-04-06 | Disposition: A | Discharge status: Home / Self Care | Payer: PPO | Source: Ambulatory Visit | Attending: Radiation Oncology | Admitting: Radiation Oncology

## 2021-04-06 DIAGNOSIS — C7931 Secondary malignant neoplasm of brain: Secondary | ICD-10-CM | POA: Diagnosis not present

## 2021-04-06 DIAGNOSIS — C642 Malignant neoplasm of left kidney, except renal pelvis: Secondary | ICD-10-CM | POA: Diagnosis not present

## 2021-04-06 MED ORDER — LEVETIRACETAM 500 MG PO TABS
ORAL_TABLET | Freq: Two times a day (BID) | ORAL | 2 refills | Status: DC
  Filled 2021-04-06: qty 60, 30d supply, fill #0
  Filled 2021-05-04: qty 60, 30d supply, fill #1
  Filled 2021-05-31: qty 60, 30d supply, fill #2

## 2021-04-06 NOTE — Op Note (Signed)
Name: Phillip [name]    MRN: [id number]   Date: 04/06/2021    DOB: 29-Sep-1945   STEREOTACTIC RADIOSURGERY OPERATIVE NOTE  PRE-OPERATIVE DIAGNOSIS:  Metastatic brain disease  POST-OPERATIVE DIAGNOSIS:  Metastatic brain disease  PROCEDURE:  Stereotactic Radiosurgery using TrueBeam Linac device  SURGEON:  Duffy Rhody, MD  RADIATION ONCOLOGIST: Kyung Rudd, MD  TECHNIQUE:  The patient underwent a radiation treatment planning session in the radiation oncology simulation suite under the care of the radiation oncology physician and physicist.  I participated closely in the radiation treatment planning afterwards. The patient underwent planning CT which was fused to 3T high resolution MRI with 1 mm axial slices.  These images were fused on the planning system.  We contoured the gross target volumes and subsequently expanded this volume by 1 mm to yield the Planning Target Volume. I actively participated in the planning process.  I helped to define and review the target contours and also the contours of the optic pathway, eyes, brainstem and selected nearby organs at risk.  All the dose constraints for critical structures were reviewed and compared to AAPM Task Group 101.  The prescription dose conformity was reviewed.  I approved the plan electronically.    Accordingly, Phillip [name]  was brought to the TrueBeam stereotactic radiation treatment linac and placed in the custom immobilization mask.  The patient was aligned according to the IR fiducial markers with BrainLab Exactrac, then orthogonal x-rays were used in ExacTrac with the 6DOF robotic table and the shifts were made to align the patient  Phillip [name] received stereotactic radiosurgery to a prescription dose of 20 Gy in a single fraction uneventfully.    The detailed description of the procedure is recorded in the radiation oncology procedure note.  I was present for the duration of the procedure.  DISPOSITION:   Following  delivery, the patient was transported to nursing in stable condition and monitored for possible acute effects to be discharged to home in stable condition with follow-up in one month.  Duffy Rhody, MD [facility]

## 2021-04-06 NOTE — Progress Notes (Signed)
Patient rested with [name] for 30 minutes following his SRS treatment.  Patient denies headache, dizziness, nausea, diplopia or ringing in the ears. Denies fatigue. Patient without complaints. Understands to avoid strenuous activity for the next 24 hours and call [phone] with needs. Patient walked out of clinic without difficulty.  BP 139/68 (BP Location: Left Arm, Patient Position: Sitting, Cuff Size: Large)    Pulse (!) 58    Temp 97.9 F (36.6 C)    Resp 20    SpO2 99%    Trenda Corliss M. Leonie Green, BSN

## 2021-04-10 ENCOUNTER — Encounter: Payer: Self-pay | Admitting: Oncology

## 2021-04-11 ENCOUNTER — Other Ambulatory Visit: Payer: Self-pay | Admitting: Nurse Practitioner

## 2021-04-11 ENCOUNTER — Other Ambulatory Visit (HOSPITAL_COMMUNITY): Payer: Self-pay

## 2021-04-11 DIAGNOSIS — C649 Malignant neoplasm of unspecified kidney, except renal pelvis: Secondary | ICD-10-CM

## 2021-04-11 MED ORDER — OXYCODONE HCL 5 MG PO TABS
5.0000 mg | ORAL_TABLET | Freq: Four times a day (QID) | ORAL | 0 refills | Status: DC | PRN
  Filled 2021-04-11: qty 60, 15d supply, fill #0

## 2021-04-11 MED ORDER — OXYCODONE HCL ER 10 MG PO T12A
EXTENDED_RELEASE_TABLET | Freq: Two times a day (BID) | ORAL | 0 refills | Status: DC
  Filled 2021-04-11: qty 120, 30d supply, fill #0

## 2021-04-14 ENCOUNTER — Other Ambulatory Visit (HOSPITAL_COMMUNITY): Payer: Self-pay

## 2021-04-14 ENCOUNTER — Other Ambulatory Visit: Payer: Self-pay | Admitting: Oncology

## 2021-04-14 DIAGNOSIS — C7931 Secondary malignant neoplasm of brain: Secondary | ICD-10-CM | POA: Diagnosis not present

## 2021-04-14 DIAGNOSIS — C649 Malignant neoplasm of unspecified kidney, except renal pelvis: Secondary | ICD-10-CM | POA: Diagnosis not present

## 2021-04-14 DIAGNOSIS — Z01810 Encounter for preprocedural cardiovascular examination: Secondary | ICD-10-CM | POA: Diagnosis not present

## 2021-04-14 DIAGNOSIS — I10 Essential (primary) hypertension: Secondary | ICD-10-CM | POA: Diagnosis not present

## 2021-04-14 DIAGNOSIS — Z6832 Body mass index (BMI) 32.0-32.9, adult: Secondary | ICD-10-CM | POA: Diagnosis not present

## 2021-04-14 DIAGNOSIS — E669 Obesity, unspecified: Secondary | ICD-10-CM | POA: Diagnosis not present

## 2021-04-14 MED FILL — Lenvatinib Cap Ther Pack 10 MG & 2 x 4 MG (18 MG Daily Dose): ORAL | 30 days supply | Qty: 30 | Fill #0 | Status: CN

## 2021-04-17 ENCOUNTER — Inpatient Hospital Stay: Payer: PPO

## 2021-04-17 ENCOUNTER — Inpatient Hospital Stay: Payer: PPO | Admitting: Oncology

## 2021-04-18 ENCOUNTER — Other Ambulatory Visit: Payer: Self-pay | Admitting: Radiation Therapy

## 2021-04-18 DIAGNOSIS — Z6832 Body mass index (BMI) 32.0-32.9, adult: Secondary | ICD-10-CM | POA: Diagnosis not present

## 2021-04-18 DIAGNOSIS — I6789 Other cerebrovascular disease: Secondary | ICD-10-CM | POA: Diagnosis not present

## 2021-04-18 DIAGNOSIS — C649 Malignant neoplasm of unspecified kidney, except renal pelvis: Secondary | ICD-10-CM | POA: Diagnosis not present

## 2021-04-18 DIAGNOSIS — C7931 Secondary malignant neoplasm of brain: Secondary | ICD-10-CM | POA: Diagnosis not present

## 2021-04-18 DIAGNOSIS — I10 Essential (primary) hypertension: Secondary | ICD-10-CM | POA: Diagnosis not present

## 2021-04-18 DIAGNOSIS — G9389 Other specified disorders of brain: Secondary | ICD-10-CM | POA: Diagnosis not present

## 2021-04-18 DIAGNOSIS — E669 Obesity, unspecified: Secondary | ICD-10-CM | POA: Diagnosis not present

## 2021-04-20 ENCOUNTER — Telehealth: Payer: Self-pay

## 2021-04-20 ENCOUNTER — Other Ambulatory Visit (HOSPITAL_COMMUNITY): Payer: Self-pay

## 2021-04-20 NOTE — Telephone Encounter (Addendum)
V/M message left from Pt's wife stating pt had  LITT procedure done at [facility] and wanted Dr [name] to know that Dr Salomon Fick the neurosurgeon wants Pt to stop lenvima for one week and double hydrocortisone which would mean he would be taking 40 mg in the morning and 10 mg in the evening. Per Dr [name] this is ok.

## 2021-04-21 ENCOUNTER — Other Ambulatory Visit: Payer: Self-pay | Admitting: Radiation Therapy

## 2021-04-21 ENCOUNTER — Ambulatory Visit
Admission: RE | Admit: 2021-04-21 | Discharge: 2021-04-21 | Disposition: A | Discharge status: Home / Self Care | Payer: Self-pay | Source: Ambulatory Visit | Attending: Radiation Oncology | Admitting: Radiation Oncology

## 2021-04-21 DIAGNOSIS — C7931 Secondary malignant neoplasm of brain: Secondary | ICD-10-CM

## 2021-04-23 ENCOUNTER — Other Ambulatory Visit: Payer: Self-pay | Admitting: Oncology

## 2021-04-24 ENCOUNTER — Inpatient Hospital Stay (HOSPITAL_BASED_OUTPATIENT_CLINIC_OR_DEPARTMENT_OTHER): Payer: PPO | Admitting: Nurse Practitioner

## 2021-04-24 ENCOUNTER — Encounter: Payer: Self-pay | Admitting: Nurse Practitioner

## 2021-04-24 ENCOUNTER — Inpatient Hospital Stay: Payer: PPO

## 2021-04-24 ENCOUNTER — Other Ambulatory Visit: Payer: Self-pay | Admitting: Radiation Therapy

## 2021-04-24 ENCOUNTER — Inpatient Hospital Stay: Payer: PPO | Attending: Nurse Practitioner

## 2021-04-24 ENCOUNTER — Telehealth: Payer: Self-pay

## 2021-04-24 ENCOUNTER — Encounter: Payer: Self-pay | Admitting: *Deleted

## 2021-04-24 ENCOUNTER — Other Ambulatory Visit (HOSPITAL_COMMUNITY): Payer: Self-pay

## 2021-04-24 ENCOUNTER — Other Ambulatory Visit: Payer: Self-pay

## 2021-04-24 VITALS — BP 123/49 | HR 62 | Temp 98.1°F | Resp 20 | Ht 71.0 in | Wt 232.4 lb

## 2021-04-24 DIAGNOSIS — I10 Essential (primary) hypertension: Secondary | ICD-10-CM | POA: Diagnosis not present

## 2021-04-24 DIAGNOSIS — C642 Malignant neoplasm of left kidney, except renal pelvis: Secondary | ICD-10-CM | POA: Insufficient documentation

## 2021-04-24 DIAGNOSIS — E538 Deficiency of other specified B group vitamins: Secondary | ICD-10-CM | POA: Diagnosis not present

## 2021-04-24 DIAGNOSIS — G893 Neoplasm related pain (acute) (chronic): Secondary | ICD-10-CM | POA: Diagnosis not present

## 2021-04-24 DIAGNOSIS — C7972 Secondary malignant neoplasm of left adrenal gland: Secondary | ICD-10-CM | POA: Diagnosis not present

## 2021-04-24 DIAGNOSIS — C7931 Secondary malignant neoplasm of brain: Secondary | ICD-10-CM | POA: Insufficient documentation

## 2021-04-24 DIAGNOSIS — C649 Malignant neoplasm of unspecified kidney, except renal pelvis: Secondary | ICD-10-CM

## 2021-04-24 DIAGNOSIS — Z95828 Presence of other vascular implants and grafts: Secondary | ICD-10-CM

## 2021-04-24 DIAGNOSIS — C7951 Secondary malignant neoplasm of bone: Secondary | ICD-10-CM | POA: Diagnosis not present

## 2021-04-24 LAB — CBC WITH DIFFERENTIAL (CANCER CENTER ONLY)
Abs Immature Granulocytes: 0.06 10*3/uL (ref 0.00–0.07)
Basophils Absolute: 0 10*3/uL (ref 0.0–0.1)
Basophils Relative: 0 %
Eosinophils Absolute: 0.4 10*3/uL (ref 0.0–0.5)
Eosinophils Relative: 3 %
HCT: 34.5 % — ABNORMAL LOW (ref 39.0–52.0)
Hemoglobin: 10.7 g/dL — ABNORMAL LOW (ref 13.0–17.0)
Immature Granulocytes: 1 %
Lymphocytes Relative: 13 %
Lymphs Abs: 1.5 10*3/uL (ref 0.7–4.0)
MCH: 27.5 pg (ref 26.0–34.0)
MCHC: 31 g/dL (ref 30.0–36.0)
MCV: 88.7 fL (ref 80.0–100.0)
Monocytes Absolute: 0.8 10*3/uL (ref 0.1–1.0)
Monocytes Relative: 7 %
Neutro Abs: 9 10*3/uL — ABNORMAL HIGH (ref 1.7–7.7)
Neutrophils Relative %: 76 %
Platelet Count: 348 10*3/uL (ref 150–400)
RBC: 3.89 MIL/uL — ABNORMAL LOW (ref 4.22–5.81)
RDW: 15.3 % (ref 11.5–15.5)
WBC Count: 11.8 10*3/uL — ABNORMAL HIGH (ref 4.0–10.5)
nRBC: 0 % (ref 0.0–0.2)

## 2021-04-24 LAB — CMP (CANCER CENTER ONLY)
ALT: 16 U/L (ref 0–44)
AST: 14 U/L — ABNORMAL LOW (ref 15–41)
Albumin: 3.5 g/dL (ref 3.5–5.0)
Alkaline Phosphatase: 56 U/L (ref 38–126)
Anion gap: 8 (ref 5–15)
BUN: 14 mg/dL (ref 8–23)
CO2: 29 mmol/L (ref 22–32)
Calcium: 9.5 mg/dL (ref 8.9–10.3)
Chloride: 99 mmol/L (ref 98–111)
Creatinine: 0.72 mg/dL (ref 0.61–1.24)
GFR, Estimated: >60 mL/min (ref >60)
Glucose, Bld: 132 mg/dL — ABNORMAL HIGH (ref 70–99)
Potassium: 4 mmol/L (ref 3.5–5.1)
Sodium: 136 mmol/L (ref 135–145)
Total Bilirubin: 0.3 mg/dL (ref 0.3–1.2)
Total Protein: 6.4 g/dL — ABNORMAL LOW (ref 6.5–8.1)

## 2021-04-24 LAB — TSH: TSH: 0.521 u[IU]/mL (ref 0.350–4.500)

## 2021-04-24 MED ORDER — HEPARIN SOD (PORK) LOCK FLUSH 100 UNIT/ML IV SOLN
500.0000 [IU] | Freq: Once | INTRAVENOUS | Status: AC
  Administered 2021-04-24: 500 [IU] via INTRAVENOUS

## 2021-04-24 MED ORDER — SODIUM CHLORIDE 0.9% FLUSH
10.0000 mL | INTRAVENOUS | Status: DC | PRN
  Administered 2021-04-24: 10 mL via INTRAVENOUS

## 2021-04-24 MED FILL — Lenvatinib Cap Ther Pack 10 MG & 2 x 4 MG (18 MG Daily Dose): ORAL | 30 days supply | Qty: 90 | Fill #0 | Status: AC

## 2021-04-24 MED FILL — Lenvatinib Cap Ther Pack 10 MG & 2 x 4 MG (18 MG Daily Dose): ORAL | 90 days supply | Qty: 90 | Fill #0 | Status: CN

## 2021-04-24 NOTE — Progress Notes (Signed)
Patient seen by Lisa Thomas NP today  Vitals are within treatment parameters.  Labs reviewed by Lisa Thomas NP and are within treatment parameters.  Per physician team, patient will not be receiving treatment today.  

## 2021-04-24 NOTE — Progress Notes (Signed)
Per Cristy Friedlander, RN pt will not receive treatment today

## 2021-04-24 NOTE — Telephone Encounter (Signed)
Called  and spoke with the patient to let know him his appointment at [facility] will be at 300 and arrived at 230 on 04/25/21. Patient voiced understanding

## 2021-04-24 NOTE — Progress Notes (Signed)
[place] OFFICE PROGRESS NOTE   Diagnosis: Renal cell carcinoma  INTERVAL HISTORY:   Mr. Stopa returns as scheduled.  He underwent [facility] 04/06/2021.  He underwent a LITT procedure at [facility] 04/18/2021.  His wife reports lenvatinib was placed on hold last week with the plan to hold for an additional week.  Hydrocortisone dose was increased.  They have a taper schedule.  No rash or diarrhea.  He has a history of a pilonidal cyst.  The cyst has been draining.  The drainage is bloody at times.  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  Objective:  Vital signs in last 24 hours:  Blood pressure (!) 123/49, pulse 62, temperature 98.1 F (36.7 C), temperature source Oral, resp. rate 20, height 5\' 11"  (1.803 m), weight 232 lb 6.4 oz (105.4 kg), SpO2 98 %.    HEENT: No thrush or ulcers. Resp: Lungs clear bilaterally. Cardio: Regular rate and rhythm. GI: Abdomen soft and nontender.  No hepatomegaly. Vascular: No leg edema. Skin: Cystic appearing area with an opening/drainage at the upper gluteal fold.  There appears to be a smaller open area inferior to the cystic area. Port-A-Cath without erythema.  Lab Results:  Lab Results  Component Value Date   WBC 11.8 (H) 04/24/2021   HGB 10.7 (L) 04/24/2021   HCT 34.5 (L) 04/24/2021   MCV 88.7 04/24/2021   PLT 348 04/24/2021   NEUTROABS 9.0 (H) 04/24/2021    Imaging:  No results found.  Medications: I have reviewed the patient's current medications.  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, L4 nerve compression Biopsy of the L4 mass 11/18/2015 confirmed metastatic renal cell carcinoma, clear cell type CTs of the chest, abdomen, and pelvis 11/18/2015-right lower lobe nodule, expansile lytic lesion at the right 11th rib/costal vertebral junction, left renal mass, expansile lesion involving the L4 vertebra, lytic lesion at the left acetabulum, and a  low-attenuation liver lesion Initiation of SRS to L4 12/02/2015, Completed 12/12/2015 Initiation of Pazopanib 12/30/2015 Pazopanib placed on hold 02/06/2016 secondary to elevated liver enzymes Pazopanib resumed 03/07/2016 at a dose of 400 mg daily  Pazopanib discontinued 03/19/2016 secondary to elevated liver enzymes Restaging CTs 04/02/2016-stable left renal mass, decreased soft tissue component associated with the L4 metastasis, increased soft tissue component associated with the right 11th rib metastasis with increased T11 bony destruction, increased sclerosis at the left acetabulum lesion Cycle 1 nivolumab 04/12/2016 Cycle 2 nivolumab 04/26/2016 Cycle 3 nivolumab 05/11/2016 Cycle 4 nivolumab 05/24/2016 Cycle 5 nivolumab 06/08/2016 MRI lumbar spine 06/21/2016-unchanged tumor at L3, increased size of retroperitoneal lymph nodes compared to a CT from 04/02/2016 Cycle 6 nivolumab  06/22/2016 CTs chest, abdomen, and pelvis 07/04/2016-enlargement of the left renal mass, right adrenal nodule, left hilar and peritoneal lymph nodes, enlargement of left acetabular lesion. Stable lung nodules. Cycle 7 nivolumab 07/06/2016 Cycle 8 nivolumab 07/20/2016 Cycle 9 nivolumab 08/02/2016 Cycle 10 nivolumab 08/20/2016 Restaging CT 09/03/2016 evaluation with stable disease Cycle 11 nivolumab 09/05/2016 Cycle 12 nivolumab 09/19/2016 Cycle 13 nivolumab 10/03/2016 Cycle 14 nivolumab 10/17/2016 Cycle 15 nivolumab 10/31/2016 Cycle 16 nivolumab 11/14/2016 (changed to monthly schedule) Cycle 17 nivolumab 12/19/2016 CTs 01/21/2017-increased left renal mass, increased size of adrenal metastases, increased lytic bone lesions, increased left lung nodule, persistent tumor at L4 with probable epidural component Initiation of Cabozantinib 01/28/2017 Restaging CTs 05/30/2017- decreased size of left hilar mass, left renal mass, retroperitoneal adenopathy, and adrenal metastasis.  Healing bone lesions. Cabozantinib  continued CTs 10/21/2017-  interval enlargement left hilar lymph node; stable rib lesions; stable mass left renal cortex; stable mildly nodular adrenal glands; stable lytic lesions within the pelvis and spine. Cabozantinib continued CTs 02/21/2018- enlargement of an AP window lymph node.  Mildly enlarged left hilar lymph node is unchanged.  Primary renal cell carcinoma involving the upper pole of the left kidney appears similar.  Stable enlarged right periaortic lymph node adjacent to the renal vessels.  Stable multifocal bony metastatic disease. Cabozantinib continued CTs 07/29/2018- stable 15 mm AP window nodes; stable left hilar node; subcarinal node slightly larger; right periaortic node 16 mm, previously 12 mm; portacaval node 24 mm, previously 16 mm; 15 mm node superior to the pancreatic head has enlarged; interval increase nodularity of both adrenal glands; stable left kidney mass; multifocal bony metastatic disease not significantly changed. Cabozantinib continued CTs 11/06/2018- moderate improvement in thoracic adenopathy; minimal improvement abdominal adenopathy; left kidney upper pole mass and various lytic expansile bone lesions stable; mild increase in nodularity of left adrenal gland; right adrenal gland nodularity stable. 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  Peripheral enhancing masses consistent with metastases. No hemorrhage. MRI brain 03/11/2019-7 enhancing brain masses consistent with metastatic disease SRS to 7 brain lesions, treatment given 03/19/2019, 03/23/2019, and 03/25/2019 CTs 04/09/2019-decrease in left renal mass, bilateral adrenal nodules, AP window and porta hepatic adenopathy.  New 9 mm right lower lobe nodule.  Improved lytic lesion of the left acetabulum.  Other bone lesions are stable. Cabozantinib continued MRI brain 07/17/2019-resolution of 4 mm treated lesion  in the right frontal cortex, 6 remaining treated lesions have decreased in size, new punctate metastasis in the superior right cerebellum SRS to right cerebellar lesion 07/30/2019 CTs 08/17/2019-previously noted right lower lobe nodule resolved, stable left kidney mass, mixed lytic/sclerotic bone lesions in the thoracolumbar spine, right posterior ribs, and left acetabulum-unchanged, no evidence of progressive disease  Cabozantinib continued MRI brain 10/23/2019-stable to slight decrease in size of multiple enhancing intracranial lesions.  Slight increase in surrounding edema in the medial left frontal lobe.  No new lesions present. MRI brain 01/29/2020-multiple enhancing brain lesions, some hemorrhagic, some with mild enlargement-treatment effect? CTs 02/08/2020-no thoracic metastases, enlargement of the left renal mass, enlargement of lytic lesion at T9, other lytic lesions unchanged MRI brain 05/04/2020-no new lesions, slight enlargement of a right occipital lesion, other lesions are stable or decreased in size CTs 08/11/2020-enlargement of left kidney mass, new 1.6 cm segment 7 liver lesion, enlargement of a lytic lesion at T9, increased lysis of a sclerotic lesion at the right second rib, 0.7 cm endoluminal nodule at the bladder dome-enlarged Brain MRI 08/10/2020-slight increase in a 15 mm left frontal lobe lesion, new punctate focus in the right occipital cortex, other lesions stable or slightly decreased CTs 11/01/2020-increase in left suprahilar opacity, enlargement of previous liver metastases, several new subcentimeter lesions, increase in left renal mass, similar appearance of bone metastases, stable hyperdense/enhancing nodule in the left bladder Brain MRI 11/04/2020-no change in brain metastases Lenvatinib/pembrolizumab 11/30/2020 CTs 03/03/2021-stable left hilar lymph nodes; resolution of left upper lobe perihilar nodularity; unchanged for millimeter peripheral right upper lobe nodule; multiple lytic bone  lesions appear stable; left kidney lesion decreased in size; bilateral adrenal nodules decreased in size; several liver lesion showed mild increase in size; several new small lesions within the right hepatic lobe. Lenvatinib/pembrolizumab continued 03/07/2021 Brain MRI 03/10/2021-progression of a left frontal metastasis, tiny new metastasis in the right  frontal lobe-referred for SBRT to the right frontal lesion and to [facility] to consider a LITT procedure for the progressive left frontal lesion SRS 04/06/2021, LITT procedure at [facility] 04/18/2021 Lenvatinib on hold Pembrolizumab held 04/24/2021 Pain secondary to #1-managed by Dr. Lovenia Shuck.  Improved Hypertension Elevated transaminases 02/06/2016- Pazopanib placed on hold Liver enzymes normal 03/07/2016 Port-A-Cath placement 05/16/2016 Malaise/anorexia 09/05/2016. Cortisol and testosterone levels low. Hydrocortisone and testosterone replacement initiated. Conjunctival/scleral erythema 09/19/2016-resolved with steroid eyedrops Proximal right leg weakness. 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 Hypercalcemia status post Zometa 01/23/2017-resolved Pruritic rash following IV contrast 10/21/2017; rash following IV contrast 02/21/2018 despite prednisone/Benadryl premedication Brief episodes of expressive aphasia October and December 2020 Vitamin B12 deficiency confirmed on lab 03/07/2021-started oral vitamin B12 replacement 03/28/2021        Disposition: Phillip [name] underwent [facility] 04/06/2021, LITT therapy 04/18/2021.  Lenvatinib has been on hold.  We decided to hold Pembrolizumab today due to the higher dose of hydrocortisone.  He will follow the taper schedule previously prescribed.  He appears to have a pilonidal cyst and may have a fistula.  We are contacting [facility] to see if he can be seen in the urgent clinic.  He will return for a follow-up visit in 1 week.  He will contact the office in the interim  with any problems.  Patient seen with Dr. [name].  [name] ANP/GNP-BC   04/24/2021  11:26 AM  This was a shared visit [name].  Mr. Mish was interviewed and examined.  He is recovering from the neurosurgical procedure performed last week.  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  We will refer him to the surgical clinic.  Lenvatinib and Beryle Flock will be placed on hold.  I was present for greater than 50% of today's visit.  I performed medical decision making.  Julieanne Manson, MD

## 2021-04-25 ENCOUNTER — Ambulatory Visit: Payer: PPO | Admitting: Oncology

## 2021-04-25 ENCOUNTER — Encounter: Payer: Self-pay | Admitting: Oncology

## 2021-04-25 ENCOUNTER — Ambulatory Visit: Payer: PPO

## 2021-04-25 ENCOUNTER — Other Ambulatory Visit: Payer: PPO

## 2021-04-25 ENCOUNTER — Other Ambulatory Visit (HOSPITAL_COMMUNITY): Payer: Self-pay

## 2021-04-25 ENCOUNTER — Telehealth: Payer: Self-pay | Admitting: Internal Medicine

## 2021-04-25 DIAGNOSIS — K610 Anal abscess: Secondary | ICD-10-CM | POA: Diagnosis not present

## 2021-04-25 DIAGNOSIS — L988 Other specified disorders of the skin and subcutaneous tissue: Secondary | ICD-10-CM | POA: Diagnosis not present

## 2021-04-25 MED ORDER — AMOXICILLIN-POT CLAVULANATE 875-125 MG PO TABS
ORAL_TABLET | ORAL | 0 refills | Status: DC
  Filled 2021-04-25: qty 14, 7d supply, fill #0

## 2021-04-25 NOTE — Telephone Encounter (Signed)
Sch per 2/27 inbasket, left pt message °

## 2021-04-27 DIAGNOSIS — Z4802 Encounter for removal of sutures: Secondary | ICD-10-CM | POA: Diagnosis not present

## 2021-05-01 ENCOUNTER — Inpatient Hospital Stay: Payer: PPO

## 2021-05-01 ENCOUNTER — Other Ambulatory Visit: Payer: Self-pay

## 2021-05-01 ENCOUNTER — Inpatient Hospital Stay (HOSPITAL_BASED_OUTPATIENT_CLINIC_OR_DEPARTMENT_OTHER): Payer: PPO | Admitting: Nurse Practitioner

## 2021-05-01 ENCOUNTER — Encounter: Payer: Self-pay | Admitting: Nurse Practitioner

## 2021-05-01 ENCOUNTER — Inpatient Hospital Stay: Payer: PPO | Attending: Nurse Practitioner

## 2021-05-01 VITALS — BP 139/59 | HR 76 | Temp 97.8°F | Resp 18 | Ht 71.0 in | Wt 224.0 lb

## 2021-05-01 VITALS — BP 108/54 | HR 55

## 2021-05-01 DIAGNOSIS — C7931 Secondary malignant neoplasm of brain: Secondary | ICD-10-CM | POA: Diagnosis not present

## 2021-05-01 DIAGNOSIS — C7972 Secondary malignant neoplasm of left adrenal gland: Secondary | ICD-10-CM | POA: Insufficient documentation

## 2021-05-01 DIAGNOSIS — I129 Hypertensive chronic kidney disease with stage 1 through stage 4 chronic kidney disease, or unspecified chronic kidney disease: Secondary | ICD-10-CM | POA: Insufficient documentation

## 2021-05-01 DIAGNOSIS — L0592 Pilonidal sinus without abscess: Secondary | ICD-10-CM | POA: Insufficient documentation

## 2021-05-01 DIAGNOSIS — C61 Malignant neoplasm of prostate: Secondary | ICD-10-CM | POA: Diagnosis not present

## 2021-05-01 DIAGNOSIS — C7951 Secondary malignant neoplasm of bone: Secondary | ICD-10-CM | POA: Insufficient documentation

## 2021-05-01 DIAGNOSIS — C649 Malignant neoplasm of unspecified kidney, except renal pelvis: Secondary | ICD-10-CM

## 2021-05-01 DIAGNOSIS — N189 Chronic kidney disease, unspecified: Secondary | ICD-10-CM | POA: Diagnosis not present

## 2021-05-01 DIAGNOSIS — Z807 Family history of other malignant neoplasms of lymphoid, hematopoietic and related tissues: Secondary | ICD-10-CM | POA: Diagnosis not present

## 2021-05-01 DIAGNOSIS — C642 Malignant neoplasm of left kidney, except renal pelvis: Secondary | ICD-10-CM | POA: Diagnosis not present

## 2021-05-01 DIAGNOSIS — Z5112 Encounter for antineoplastic immunotherapy: Secondary | ICD-10-CM | POA: Diagnosis not present

## 2021-05-01 LAB — CMP (CANCER CENTER ONLY)
ALT: 73 U/L — ABNORMAL HIGH (ref 0–44)
AST: 25 U/L (ref 15–41)
Albumin: 3.9 g/dL (ref 3.5–5.0)
Alkaline Phosphatase: 140 U/L — ABNORMAL HIGH (ref 38–126)
Anion gap: 9 (ref 5–15)
BUN: 17 mg/dL (ref 8–23)
CO2: 28 mmol/L (ref 22–32)
Calcium: 9.4 mg/dL (ref 8.9–10.3)
Chloride: 99 mmol/L (ref 98–111)
Creatinine: 0.74 mg/dL (ref 0.61–1.24)
GFR, Estimated: >60 mL/min (ref >60)
Glucose, Bld: 128 mg/dL — ABNORMAL HIGH (ref 70–99)
Potassium: 4.2 mmol/L (ref 3.5–5.1)
Sodium: 136 mmol/L (ref 135–145)
Total Bilirubin: 0.3 mg/dL (ref 0.3–1.2)
Total Protein: 6.5 g/dL (ref 6.5–8.1)

## 2021-05-01 LAB — CBC WITH DIFFERENTIAL (CANCER CENTER ONLY)
Abs Immature Granulocytes: 0.09 10*3/uL — ABNORMAL HIGH (ref 0.00–0.07)
Basophils Absolute: 0 10*3/uL (ref 0.0–0.1)
Basophils Relative: 0 %
Eosinophils Absolute: 0.8 10*3/uL — ABNORMAL HIGH (ref 0.0–0.5)
Eosinophils Relative: 5 %
HCT: 34.8 % — ABNORMAL LOW (ref 39.0–52.0)
Hemoglobin: 10.7 g/dL — ABNORMAL LOW (ref 13.0–17.0)
Immature Granulocytes: 1 %
Lymphocytes Relative: 14 %
Lymphs Abs: 2.1 10*3/uL (ref 0.7–4.0)
MCH: 27 pg (ref 26.0–34.0)
MCHC: 30.7 g/dL (ref 30.0–36.0)
MCV: 87.7 fL (ref 80.0–100.0)
Monocytes Absolute: 1.1 10*3/uL — ABNORMAL HIGH (ref 0.1–1.0)
Monocytes Relative: 8 %
Neutro Abs: 10.5 10*3/uL — ABNORMAL HIGH (ref 1.7–7.7)
Neutrophils Relative %: 72 %
Platelet Count: 372 10*3/uL (ref 150–400)
RBC: 3.97 MIL/uL — ABNORMAL LOW (ref 4.22–5.81)
RDW: 15.2 % (ref 11.5–15.5)
WBC Count: 14.7 10*3/uL — ABNORMAL HIGH (ref 4.0–10.5)
nRBC: 0 % (ref 0.0–0.2)

## 2021-05-01 MED ORDER — SODIUM CHLORIDE 0.9 % IV SOLN
200.0000 mg | Freq: Once | INTRAVENOUS | Status: AC
  Administered 2021-05-01: 200 mg via INTRAVENOUS
  Filled 2021-05-01: qty 8

## 2021-05-01 MED ORDER — SODIUM CHLORIDE 0.9% FLUSH
10.0000 mL | INTRAVENOUS | Status: DC | PRN
  Administered 2021-05-01: 10 mL

## 2021-05-01 MED ORDER — SODIUM CHLORIDE 0.9 % IV SOLN: Freq: Once | INTRAVENOUS | Status: AC

## 2021-05-01 MED ORDER — HEPARIN SOD (PORK) LOCK FLUSH 100 UNIT/ML IV SOLN
500.0000 [IU] | Freq: Once | INTRAVENOUS | Status: AC | PRN
  Administered 2021-05-01: 500 [IU]

## 2021-05-01 NOTE — Progress Notes (Signed)
Patient seen by Lisa Thomas NP today  Vitals are within treatment parameters.  Labs reviewed by Lisa Thomas NP and are within treatment parameters.  Per physician team, patient is ready for treatment and there are NO modifications to the treatment plan.     

## 2021-05-01 NOTE — Patient Instructions (Signed)
[facility]   °Discharge Instructions: °Thank you for choosing [facility] to provide your oncology and hematology care.  ° °If you have a lab appointment with the Cancer Center, please go directly to the Cancer Center and check in at the registration area. °  °Wear comfortable clothing and clothing appropriate for easy access to any Portacath or PICC line.  ° °We strive to give you quality time with your provider. You may need to reschedule your appointment if you arrive late (15 or more minutes).  Arriving late affects you and other patients whose appointments are after yours.  Also, if you miss three or more appointments without notifying the office, you may be dismissed from the clinic at the provider’s discretion.    °  °For prescription refill requests, have your pharmacy contact our office and allow 72 hours for refills to be completed.   ° °Today you received the following chemotherapy and/or immunotherapy agents Keytruda    °  °To help prevent nausea and vomiting after your treatment, we encourage you to take your nausea medication as directed. ° °BELOW ARE SYMPTOMS THAT SHOULD BE REPORTED IMMEDIATELY: °*FEVER GREATER THAN 100.4 F (38 °C) OR HIGHER °*CHILLS OR SWEATING °*NAUSEA AND VOMITING THAT IS NOT CONTROLLED WITH YOUR NAUSEA MEDICATION °*UNUSUAL SHORTNESS OF BREATH °*UNUSUAL BRUISING OR BLEEDING °*URINARY PROBLEMS (pain or burning when urinating, or frequent urination) °*BOWEL PROBLEMS (unusual diarrhea, constipation, pain near the anus) °TENDERNESS IN MOUTH AND THROAT WITH OR WITHOUT PRESENCE OF ULCERS (sore throat, sores in mouth, or a toothache) °UNUSUAL RASH, SWELLING OR PAIN  °UNUSUAL VAGINAL DISCHARGE OR ITCHING  ° °Items with * indicate a potential emergency and should be followed up as soon as possible or go to the Emergency Department if any problems should occur. ° °Please show the CHEMOTHERAPY ALERT CARD or IMMUNOTHERAPY ALERT CARD at check-in to the  Emergency Department and triage nurse. ° °Should you have questions after your visit or need to cancel or reschedule your appointment, please contact [facility]  Dept: [phone]  and follow the prompts.  Office hours are 8:00 a.m. to 4:30 p.m. Monday - Friday. Please note that voicemails left after 4:00 p.m. may not be returned until the following business day.  We are closed weekends and major holidays. You have access to a nurse at all times for urgent questions. Please call the main number to the clinic Dept: [phone] and follow the prompts. ° ° °For any non-urgent questions, you may also contact your provider using MyChart. We now offer e-Visits for anyone 18 and older to request care online for non-urgent symptoms. For details visit [email or web address]. °  °Also download the MyChart app! Go to the app store, search "MyChart", open the app, select [facility], and log in with your MyChart username and password. ° °Due to Covid, a mask is required upon entering the hospital/clinic. If you do not have a mask, one will be given to you upon arrival. For doctor visits, patients may have 1 support person aged 18 or older with them. For treatment visits, patients cannot have anyone with them due to current Covid guidelines and our immunocompromised population.  ° °Pembrolizumab injection °What is this medication? °PEMBROLIZUMAB (pem broe liz ue mab) is a monoclonal antibody. It is used to treat certain types of cancer. °This medicine may be used for other purposes; ask your health care provider or pharmacist if you have questions. °COMMON BRAND NAME(S): Keytruda °  What should I tell my care team before I take this medication? °They need to know if you have any of these conditions: °autoimmune diseases like Crohn's disease, ulcerative colitis, or lupus °have had or planning to have an allogeneic stem cell transplant (uses someone else's stem cells) °history of organ  transplant °history of chest radiation °nervous system problems like myasthenia gravis or Guillain-Barre syndrome °an unusual or allergic reaction to pembrolizumab, other medicines, foods, dyes, or preservatives °pregnant or trying to get pregnant °breast-feeding °How should I use this medication? °This medicine is for infusion into a vein. It is given by a health care professional in a hospital or clinic setting. °A special MedGuide will be given to you before each treatment. Be sure to read this information carefully each time. °Talk to your pediatrician regarding the use of this medicine in children. While this drug may be prescribed for children as young as 6 months for selected conditions, precautions do apply. °Overdosage: If you think you have taken too much of this medicine contact a poison control center or emergency room at once. °NOTE: This medicine is only for you. Do not share this medicine with others. °What if I miss a dose? °It is important not to miss your dose. Call your doctor or health care professional if you are unable to keep an appointment. °What may interact with this medication? °Interactions have not been studied. °This list may not describe all possible interactions. Give your health care provider a list of all the medicines, herbs, non-prescription drugs, or dietary supplements you use. Also tell them if you smoke, drink alcohol, or use illegal drugs. Some items may interact with your medicine. °What should I watch for while using this medication? °Your condition will be monitored carefully while you are receiving this medicine. °You may need blood work done while you are taking this medicine. °Do not become pregnant while taking this medicine or for 4 months after stopping it. Women should inform their doctor if they wish to become pregnant or think they might be pregnant. There is a potential for serious side effects to an unborn child. Talk to your health care professional or  pharmacist for more information. Do not breast-feed an infant while taking this medicine or for 4 months after the last dose. °What side effects may I notice from receiving this medication? °Side effects that you should report to your doctor or health care professional as soon as possible: °allergic reactions like skin rash, itching or hives, swelling of the face, lips, or tongue °bloody or black, tarry °breathing problems °changes in vision °chest pain °chills °confusion °constipation °cough °diarrhea °dizziness or feeling faint or lightheaded °fast or irregular heartbeat °fever °flushing °joint pain °low blood counts - this medicine may decrease the number of white blood cells, red blood cells and platelets. You may be at increased risk for infections and bleeding. °muscle pain °muscle weakness °pain, tingling, numbness in the hands or feet °persistent headache °redness, blistering, peeling or loosening of the skin, including inside the mouth °signs and symptoms of high blood sugar such as dizziness; dry mouth; dry skin; fruity breath; nausea; stomach pain; increased hunger or thirst; increased urination °signs and symptoms of kidney injury like trouble passing urine or change in the amount of urine °signs and symptoms of liver injury like dark urine, light-colored stools, loss of appetite, nausea, right upper belly pain, yellowing of the eyes or skin °sweating °swollen lymph nodes °weight loss °Side effects that usually do not require   medical attention (report to your doctor or health care professional if they continue or are bothersome): °decreased appetite °hair loss °tiredness °This list may not describe all possible side effects. Call your doctor for medical advice about side effects. You may report side effects to FDA at 1-800-FDA-1088. °Where should I keep my medication? °This drug is given in a hospital or clinic and will not be stored at home. °NOTE: This sheet is a summary. It may not cover all possible  information. If you have questions about this medicine, talk to your doctor, pharmacist, or health care provider. °© 2022 [employer] (2020-11-01 00:00:00) ° °

## 2021-05-01 NOTE — Progress Notes (Signed)
[place] OFFICE PROGRESS NOTE   Diagnosis: Renal cell carcinoma  INTERVAL HISTORY:   Phillip [name] returns as scheduled.  Treatment was held last week due to being on a higher dose of hydrocortisone and concern for a possible fistula at the gluteal fold/perineum.  He saw Phillip Hurl PA at [facility] surgery last week.  A course of Augmentin was prescribed.  He was scheduled follow-up with a colorectal surgeon.  He is feeling better. Hydrocortisone is back to baseline dose.  He is on an antibiotic for the  Objective:  Vital signs in last 24 hours:  Blood pressure (!) 139/59, pulse 76, temperature 97.8 F (36.6 C), temperature source Oral, resp. rate 18, height '5\' 11"'$  (1.803 m), weight 224 lb (101.6 kg), SpO2 98 %.    HEENT: No thrush or ulcers. Resp: Lungs clear bilaterally. Cardio: Regular rate and rhythm. GI: No hepatosplenomegaly. Vascular: No leg edema. Skin: Cystic appearing lesion at the upper gluteal fold appears less inflamed.  No significant drainage.  Persistent smaller open area inferior to the cystic lesion. Port-A-Cath without erythema.  Lab Results:  Lab Results  Component Value Date   WBC 14.7 (H) 05/01/2021   HGB 10.7 (L) 05/01/2021   HCT 34.8 (L) 05/01/2021   MCV 87.7 05/01/2021   PLT 372 05/01/2021   NEUTROABS 10.5 (H) 05/01/2021    Imaging:  No results found.  Medications: I have reviewed the patient's current medications.  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, L4 nerve compression Biopsy of the L4 mass 11/18/2015 confirmed metastatic renal cell carcinoma, clear cell type CTs of the chest, abdomen, and pelvis 11/18/2015-right lower lobe nodule, expansile lytic lesion at the right 11th rib/costal vertebral junction, left renal mass, expansile lesion involving the L4 vertebra, lytic lesion at the left acetabulum, and a low-attenuation liver lesion Initiation of SRS to L4  12/02/2015, Completed 12/12/2015 Initiation of Pazopanib 12/30/2015 Pazopanib placed on hold 02/06/2016 secondary to elevated liver enzymes Pazopanib resumed 03/07/2016 at a dose of 400 mg daily  Pazopanib discontinued 03/19/2016 secondary to elevated liver enzymes Restaging CTs 04/02/2016-stable left renal mass, decreased soft tissue component associated with the L4 metastasis, increased soft tissue component associated with the right 11th rib metastasis with increased T11 bony destruction, increased sclerosis at the left acetabulum lesion Cycle 1 nivolumab 04/12/2016 Cycle 2 nivolumab 04/26/2016 Cycle 3 nivolumab 05/11/2016 Cycle 4 nivolumab 05/24/2016 Cycle 5 nivolumab 06/08/2016 MRI lumbar spine 06/21/2016-unchanged tumor at L3, increased size of retroperitoneal lymph nodes compared to a CT from 04/02/2016 Cycle 6 nivolumab  06/22/2016 CTs chest, abdomen, and pelvis 07/04/2016-enlargement of the left renal mass, right adrenal nodule, left hilar and peritoneal lymph nodes, enlargement of left acetabular lesion. Stable lung nodules. Cycle 7 nivolumab 07/06/2016 Cycle 8 nivolumab 07/20/2016 Cycle 9 nivolumab 08/02/2016 Cycle 10 nivolumab 08/20/2016 Restaging CT 09/03/2016 evaluation with stable disease Cycle 11 nivolumab 09/05/2016 Cycle 12 nivolumab 09/19/2016 Cycle 13 nivolumab 10/03/2016 Cycle 14 nivolumab 10/17/2016 Cycle 15 nivolumab 10/31/2016 Cycle 16 nivolumab 11/14/2016 (changed to monthly schedule) Cycle 17 nivolumab 12/19/2016 CTs 01/21/2017-increased left renal mass, increased size of adrenal metastases, increased lytic bone lesions, increased left lung nodule, persistent tumor at L4 with probable epidural component Initiation of Cabozantinib 01/28/2017 Restaging CTs 05/30/2017- decreased size of left hilar mass, left renal mass, retroperitoneal adenopathy, and adrenal metastasis.  Healing bone lesions. Cabozantinib continued CTs 10/21/2017- interval enlargement left hilar  lymph node; stable rib lesions; stable mass left renal cortex; stable mildly nodular adrenal glands;  stable lytic lesions within the pelvis and spine. Cabozantinib continued CTs 02/21/2018- enlargement of an AP window lymph node.  Mildly enlarged left hilar lymph node is unchanged.  Primary renal cell carcinoma involving the upper pole of the left kidney appears similar.  Stable enlarged right periaortic lymph node adjacent to the renal vessels.  Stable multifocal bony metastatic disease. Cabozantinib continued CTs 07/29/2018- stable 15 mm AP window nodes; stable left hilar node; subcarinal node slightly larger; right periaortic node 16 mm, previously 12 mm; portacaval node 24 mm, previously 16 mm; 15 mm node superior to the pancreatic head has enlarged; interval increase nodularity of both adrenal glands; stable left kidney mass; multifocal bony metastatic disease not significantly changed. Cabozantinib continued CTs 11/06/2018- moderate improvement in thoracic adenopathy; minimal improvement abdominal adenopathy; left kidney upper pole mass and various lytic expansile bone lesions stable; mild increase in nodularity of left adrenal gland; right adrenal gland nodularity stable. 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  Peripheral enhancing masses consistent with metastases. No hemorrhage. MRI brain 03/11/2019-7 enhancing brain masses consistent with metastatic disease SRS to 7 brain lesions, treatment given 03/19/2019, 03/23/2019, and 03/25/2019 CTs 04/09/2019-decrease in left renal mass, bilateral adrenal nodules, AP window and porta hepatic adenopathy.  New 9 mm right lower lobe nodule.  Improved lytic lesion of the left acetabulum.  Other bone lesions are stable. Cabozantinib continued MRI brain 07/17/2019-resolution of 4 mm treated lesion in the right frontal cortex, 6 remaining treated lesions  have decreased in size, new punctate metastasis in the superior right cerebellum SRS to right cerebellar lesion 07/30/2019 CTs 08/17/2019-previously noted right lower lobe nodule resolved, stable left kidney mass, mixed lytic/sclerotic bone lesions in the thoracolumbar spine, right posterior ribs, and left acetabulum-unchanged, no evidence of progressive disease  Cabozantinib continued MRI brain 10/23/2019-stable to slight decrease in size of multiple enhancing intracranial lesions.  Slight increase in surrounding edema in the medial left frontal lobe.  No new lesions present. MRI brain 01/29/2020-multiple enhancing brain lesions, some hemorrhagic, some with mild enlargement-treatment effect? CTs 02/08/2020-no thoracic metastases, enlargement of the left renal mass, enlargement of lytic lesion at T9, other lytic lesions unchanged MRI brain 05/04/2020-no new lesions, slight enlargement of a right occipital lesion, other lesions are stable or decreased in size CTs 08/11/2020-enlargement of left kidney mass, new 1.6 cm segment 7 liver lesion, enlargement of a lytic lesion at T9, increased lysis of a sclerotic lesion at the right second rib, 0.7 cm endoluminal nodule at the bladder dome-enlarged Brain MRI 08/10/2020-slight increase in a 15 mm left frontal lobe lesion, new punctate focus in the right occipital cortex, other lesions stable or slightly decreased CTs 11/01/2020-increase in left suprahilar opacity, enlargement of previous liver metastases, several new subcentimeter lesions, increase in left renal mass, similar appearance of bone metastases, stable hyperdense/enhancing nodule in the left bladder Brain MRI 11/04/2020-no change in brain metastases Lenvatinib/pembrolizumab 11/30/2020 CTs 03/03/2021-stable left hilar lymph nodes; resolution of left upper lobe perihilar nodularity; unchanged for millimeter peripheral right upper lobe nodule; multiple lytic bone lesions appear stable; left kidney lesion decreased in  size; bilateral adrenal nodules decreased in size; several liver lesion showed mild increase in size; several new small lesions within the right hepatic lobe. Lenvatinib/pembrolizumab continued 03/07/2021 Brain MRI 03/10/2021-progression of a left frontal metastasis, tiny new metastasis in the right frontal lobe-referred for SBRT to the right frontal lesion and to [facility] to consider a LITT procedure for  the progressive left frontal lesion SRS 04/06/2021, LITT procedure at [facility] 04/18/2021 Lenvatinib on hold Pembrolizumab held 04/24/2021 Pembrolizumab 05/01/2021, lenvatinib remains on hold pending surgical evaluation Pain secondary to #1-managed by Dr. Lovenia Shuck.  Improved Hypertension Elevated transaminases 02/06/2016- Pazopanib placed on hold Liver enzymes normal 03/07/2016 Port-A-Cath placement 05/16/2016 Malaise/anorexia 09/05/2016. Cortisol and testosterone levels low. Hydrocortisone and testosterone replacement initiated. Conjunctival/scleral erythema 09/19/2016-resolved with steroid eyedrops Proximal right leg weakness. 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 Hypercalcemia status post Zometa 01/23/2017-resolved Pruritic rash following IV contrast 10/21/2017; rash following IV contrast 02/21/2018 despite prednisone/Benadryl premedication Brief episodes of expressive aphasia October and December 2020 Vitamin B12 deficiency confirmed on lab 03/07/2021-started oral vitamin B12 replacement 03/28/2021        Disposition: Mr. Meyn appears stable.  He is back to the baseline dose of hydrocortisone.  Plan to proceed with Pembrolizumab today as scheduled.  Lenvatinib remains on hold pending surgical evaluation which is scheduled next week.  CBC and chemistry panel reviewed.  Labs adequate to proceed with treatment.  ALT is mildly elevated.  We will continue to monitor.  He will return for lab, follow-up, Pembrolizumab in 3 weeks.  He will contact the office in  the interim with any problems.    [name] ANP/GNP-BC   05/01/2021  12:16 PM

## 2021-05-01 NOTE — Progress Notes (Signed)
Patient presents for treatment. RN assessment completed along with the following: ? ?Labs/vitals reviewed - Yes, and within treatment parameters.   ?Weight within 10% of previous measurement - Yes ?Informed consent completed and reflects current therapy/intent - Yes, on date 11/30/20             ?Provider progress note reviewed - Today's provider note is not yet available. I reviewed the most recent oncology provider progress note in chart dated 04/24/21. ?Treatment/Antibody/Supportive plan reviewed - Yes, and there are no adjustments needed for today's treatment. ?S&H and other orders reviewed - Yes, and there are no additional orders identified. ?Previous treatment date reviewed - Yes, and the appropriate amount of time has elapsed between treatments. ?Clinic Hand Off Received from - Leander Rams, NP ? ?Patient to proceed with treatment.  ? ?

## 2021-05-04 ENCOUNTER — Other Ambulatory Visit (HOSPITAL_COMMUNITY): Payer: Self-pay

## 2021-05-04 ENCOUNTER — Other Ambulatory Visit: Payer: Self-pay | Admitting: Nurse Practitioner

## 2021-05-04 DIAGNOSIS — C649 Malignant neoplasm of unspecified kidney, except renal pelvis: Secondary | ICD-10-CM

## 2021-05-04 MED ORDER — OXYCODONE HCL 5 MG PO TABS
5.0000 mg | ORAL_TABLET | Freq: Four times a day (QID) | ORAL | 0 refills | Status: DC | PRN
  Filled 2021-05-04: qty 60, 15d supply, fill #0

## 2021-05-04 MED ORDER — OXYCODONE HCL ER 10 MG PO T12A
EXTENDED_RELEASE_TABLET | Freq: Two times a day (BID) | ORAL | 0 refills | Status: DC
  Filled 2021-05-04: qty 120, fill #0
  Filled 2021-05-10: qty 120, 30d supply, fill #0

## 2021-05-04 MED ORDER — ONDANSETRON HCL 8 MG PO TABS
ORAL_TABLET | ORAL | 2 refills | Status: DC
  Filled 2021-05-04: qty 90, 30d supply, fill #0
  Filled 2021-05-31: qty 90, 30d supply, fill #1
  Filled 2021-07-17: qty 90, 30d supply, fill #2

## 2021-05-05 ENCOUNTER — Other Ambulatory Visit: Payer: Self-pay | Admitting: *Deleted

## 2021-05-05 DIAGNOSIS — C649 Malignant neoplasm of unspecified kidney, except renal pelvis: Secondary | ICD-10-CM

## 2021-05-08 ENCOUNTER — Ambulatory Visit
Admission: RE | Admit: 2021-05-08 | Discharge: 2021-05-08 | Disposition: A | Discharge status: Home / Self Care | Payer: PPO | Source: Ambulatory Visit | Attending: Internal Medicine | Admitting: Internal Medicine

## 2021-05-08 ENCOUNTER — Encounter: Payer: Self-pay | Admitting: Oncology

## 2021-05-08 ENCOUNTER — Other Ambulatory Visit (HOSPITAL_COMMUNITY): Payer: Self-pay

## 2021-05-08 DIAGNOSIS — C649 Malignant neoplasm of unspecified kidney, except renal pelvis: Secondary | ICD-10-CM | POA: Insufficient documentation

## 2021-05-08 DIAGNOSIS — C7931 Secondary malignant neoplasm of brain: Secondary | ICD-10-CM | POA: Insufficient documentation

## 2021-05-08 MED ORDER — HYDROCORTISONE 10 MG PO TABS
ORAL_TABLET | Freq: Every day | ORAL | 3 refills | Status: DC
  Filled 2021-05-08: qty 90, 30d supply, fill #0
  Filled 2021-05-31: qty 90, 30d supply, fill #1
  Filled 2021-07-04: qty 90, 30d supply, fill #2
  Filled 2021-08-08: qty 90, 30d supply, fill #3

## 2021-05-08 NOTE — Progress Notes (Signed)
?  Radiation Oncology         [phone] ?________________________________ ? ?Name: Phillip [name] MRN: [id number]  ?Date of Service: 05/08/2021  DOB: 10/07/1945 ? ?Post Treatment Telephone Note ? 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 ? ?Intent: Palliative ? ?Radiation Treatment Dates: 04/06/2021 through 04/06/2021 ?Site Technique Total Dose (Gy) Dose per Fx (Gy) Completed Fx Beam Energies  ?Brain: Brain ?[id number] 3D 20/20 20 1/1 6XFFF  ? ?Narrative: The patient tolerated radiation therapy relatively well.  ? ? ?Impression/Plan: ?1. 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 The patient has been doing well since completion of radiotherapy.  I was unable to reach the patient but left a voicemail and on the message, I discussed that we would be happy to continue to follow her as needed, but she will also continue to follow up with Dr. VMickeal Skinnerand Dr. SBenay Spicein medical oncology.    ? ? ? ? ?ACarola Rhine PAC  ? ? ? ? ?

## 2021-05-09 DIAGNOSIS — L988 Other specified disorders of the skin and subcutaneous tissue: Secondary | ICD-10-CM | POA: Diagnosis not present

## 2021-05-10 ENCOUNTER — Other Ambulatory Visit (HOSPITAL_COMMUNITY): Payer: Self-pay

## 2021-05-11 ENCOUNTER — Inpatient Hospital Stay (HOSPITAL_BASED_OUTPATIENT_CLINIC_OR_DEPARTMENT_OTHER): Payer: PPO | Admitting: Internal Medicine

## 2021-05-11 ENCOUNTER — Other Ambulatory Visit: Payer: Self-pay

## 2021-05-11 VITALS — BP 115/45 | HR 62 | Temp 97.9°F | Resp 18 | Ht 71.0 in | Wt 229.3 lb

## 2021-05-11 DIAGNOSIS — R569 Unspecified convulsions: Secondary | ICD-10-CM

## 2021-05-11 DIAGNOSIS — C7931 Secondary malignant neoplasm of brain: Secondary | ICD-10-CM | POA: Diagnosis not present

## 2021-05-11 DIAGNOSIS — Z5112 Encounter for antineoplastic immunotherapy: Secondary | ICD-10-CM | POA: Diagnosis not present

## 2021-05-11 NOTE — Progress Notes (Signed)
? ?[facility] [address]  ?[place], [place] [address] ?[phone] ? ? ?Interval Evaluation ? ?Date of Service: 05/11/21 ?Patient Name: Phillip [name] ?Patient MRN: [id number] ?Patient DOB: 09-11-1945 ?Provider: [name], MD ? ?Identifying Statement:  ?Phillip [name] is a 76 y.o. male with Metastasis to brain [facility]) [C79.31]  ? ?Primary Cancer: Renal Cell Carcinoma ? 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 ?04/06/21: SRS R frontal operculum ([place]) ? ?03/19/19-03/25/19 SRS Treatment: ?PTV1 Lt Temporal 79mm 27 Gy in 3 fractions ?PTV2 Lt Occipital 61mm  27 Gy in 3 fractions ?PTV6 Lt Frontal 69mm  27 Gy in 3 fractions ?  ?03/19/19 SRS Treatment:  ?PTV3 Rt Occipital 34mm  ?PTV4 Lt Frontal 75mm   ?PTV5 Rt Frontal 48mm  ?PTV7 Lt Parietal 31mm  ?All of these were treated to 20 Gy in 1 fraction ?  ?12/02/2015 to 12/12/2015 SBRT Treatment:  ?The L4 Right spinal was treated to 50 Gy in 5 fractions at 10 Gy per fraction ? ?Interval History: ? Phillip [name] presents today for follow up after LITT treatment last month at [facility].  Denies any new or progressive neurologic deficits.  He is having some drainage from his bottom (previously had perianal abscess) which has been examined by general surgery, treated Augmentin.  Dr. [name] is holding [place] and levatinib currently. Denies headaches and seizures. Remains active at home and at church.  ? ?Medications: ?Current Outpatient Medications on File Prior to Visit  ?Medication Sig Dispense Refill  ? acetaminophen (TYLENOL) 650 MG CR tablet Take 650 mg by mouth every 8 (eight) hours as needed for pain.    ? amoxicillin-clavulanate (AUGMENTIN) 875-125 MG tablet Take 1 tablet by mouth every 12 hours for 7 days 14 tablet 0  ? docusate sodium (COLACE) 100 MG capsule Take 100 mg by mouth 2 (two) times daily.    ? famotidine (PEPCID) 10 MG tablet Take 10 mg  by mouth 2 (two) times daily.    ? hydrocortisone (CORTEF) 10 MG tablet TAKE 2 TABLETS BY MOUTH IN THE MORNING AND 1 TABLET IN THE EVENING 90 tablet 3  ? ibuprofen (ADVIL,MOTRIN) 200 MG tablet Take 200 mg by mouth every 8 (eight) hours as needed for fever, headache, mild pain, moderate pain or cramping.     ? lenvatinib 18 mg daily dose (LENVIMA, 18 MG DAILY DOSE,) 10 MG & 2 x 4 MG capsule Take 18 mg by mouth daily. (Patient not taking: Reported on 05/01/2021) 90 capsule 0  ? levETIRAcetam (KEPPRA) 500 MG tablet TAKE 1 TABLET BY MOUTH 2 TIMES DAILY 60 tablet 2  ? lidocaine-prilocaine (EMLA) cream APPLY TO PORTACATH 1 HOUR PRIOR TO USE AS NEEDED 30 g 2  ? LORazepam (ATIVAN) 0.5 MG tablet Take 1-2 tablets (0.5-1 mg total) by mouth every 6 (six) hours as needed for anxiety. 60 tablet 2  ? losartan-hydrochlorothiazide (HYZAAR) 100-12.5 MG tablet Take 1 tablet by mouth once a day. 90 tablet 3  ? mirtazapine (REMERON) 30 MG tablet TAKE 1 TABLET BY MOUTH EVERY EVENING AT BEDTIME 90 tablet 5  ? Multiple Vitamin (MULTIVITAMIN ADULT PO) Take 1 tablet by mouth daily. [place] product    ? ondansetron (ZOFRAN) 8 MG tablet TAKE 1 TABLET BY MOUTH EVERY 8 HOURS AS NEEDED FOR NAUSEA AND VOMITING 90 tablet 2  ? oxyCODONE (OXY IR/ROXICODONE) 5 MG immediate release tablet Take 1 tablet by mouth every 6  hours as needed for severe pain. 60 tablet 0  ? oxyCODONE (OXYCONTIN) 10 mg 12 hr tablet TAKE 2 TABLETS BY MOUTH EVERY 12 HOURS. 120 tablet 0  ? polyethylene glycol (MIRALAX / GLYCOLAX) 17 g packet Take 17 g by mouth daily.    ? predniSONE (DELTASONE) 50 MG tablet Take 1  tablet by mouth 13 hours, 7 hours, and 1 hour prior to CT scan. 3 tablet 0  ? prochlorperazine (COMPAZINE) 10 MG tablet Take 1 tablet by mouth every 6 hours as needed for nausea or vomiting. 30 tablet 2  ? Testosterone 1.62 % GEL APPLY 2 PUMPS TO AREA ONCE A DAY AS DIRECTED 75 g 3  ? Testosterone 1.62 % GEL Apply 2 pumps topically daily 75 g 3  ? vitamin B-12  (CYANOCOBALAMIN) 1000 MCG tablet Take 1 tablet (1,000 mcg total) by mouth daily. Take 1 tablet twice daily x 3 days, then daily    ? ?Current Facility-Administered Medications on File Prior to Visit  ?Medication Dose Route Frequency Provider Last Rate Last Admin  ? sodium chloride flush (NS) 0.9 % injection 10 mL  10 mL Intravenous PRN [name], MD   10 mL at 06/22/16 6269  ? ? ?Allergies:  ?Allergies  ?Allergen Reactions  ? Contrast Media [Iodinated Contrast Media] Rash  ? ?Past Medical History:  ?Past Medical History:  ?Diagnosis Date  ? Anxiety   ? Brain cancer [facility])   ? Chronic kidney disease   ? renal cancer  ? Constipation   ? Depression   ? Hypertension   ? Low testosterone in male   ? 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  ? Seizures ([place])   ? last seizure 01/2019 - controlled since on keppra  ? Wears glasses   ? ?Past Surgical History:  ?Past Surgical History:  ?Procedure Laterality Date  ? insertion port-a-cath  2018  ? IR GENERIC HISTORICAL  11/18/2015  ? IR FLUORO GUIDED NEEDLE PLC ASPIRATION/INJECTION LOC 11/18/2015 [name], MD [facility]-INTERV RAD  ? IR GENERIC HISTORICAL  05/16/2016  ? IR FLUORO GUIDE PORT INSERTION RIGHT 05/16/2016 [name], MD [facility]-INTERV RAD  ? IR GENERIC HISTORICAL  05/16/2016  ? IR US GUIDE VASC ACCESS RIGHT 05/16/2016 [name], MD [facility]-INTERV RAD  ? RADIOLOGY WITH ANESTHESIA N/A 06/16/2019  ? Procedure: MRI BRAIN WITH AND WITHOUT CONTRAST WITH ANESTHESIA;  Surgeon: Radiologist, Medication, MD;  Location: [facility];  Service: Radiology;  Laterality: N/A;  ? ?Social History:  ?Social History  ? ?Socioeconomic History  ? Marital status: Single  ?  Spouse name: Not on file  ? Number of children: Not on file  ? Years of education: Not on file  ? Highest education level: Not on file  ?Occupational History  ? Not on file  ?Tobacco Use  ? Smoking status: Never  ? Smokeless tobacco: Never  ?Vaping Use  ? Vaping Use: Never used  ?Substance and Sexual Activity  ? Alcohol  use: Not Currently  ?  Alcohol/week: 1.0 standard drink  ?  Types: 1 Glasses of wine per week  ? Drug use: Yes  ?  Types: Marijuana  ?  Comment: Last use 06/13/19  ? Sexual activity: Not Currently  ?Other Topics Concern  ? Not on file  ?Social History Narrative  ? Not on file  ? ?Social Determinants of Health  ? ?Financial Resource Strain: Not on file  ?Food Insecurity: Not on file  ?Transportation Needs: Not on file  ?Physical Activity:  Not on file  ?Stress: Not on file  ?Social Connections: Not on file  ?Intimate Partner Violence: Not on file  ? ?Family History:  ?Family History  ?Problem Relation Age of Onset  ? Cancer Grandchild   ?     lymphoma  ? ? ?Review of Systems: ?Constitutional: Doesn't report fevers, chills or abnormal weight loss ?Eyes: Doesn't report blurriness of vision ?Ears, nose, mouth, throat, and face: Doesn't report sore throat ?Respiratory: Doesn't report cough, dyspnea or wheezes ?Cardiovascular: Doesn't report palpitation, chest discomfort  ?Gastrointestinal:  Doesn't report nausea, constipation, diarrhea ?GU: Doesn't report incontinence ?Skin: Doesn't report skin rashes ?Neurological: Per HPI ?Musculoskeletal: Doesn't report joint pain ?Behavioral/Psych: Doesn't report anxiety ? ?Physical Exam: ?Vitals:  ? 05/11/21 1112  ?BP: (!) 115/45  ?Pulse: 62  ?Resp: 18  ?Temp: 97.9 ?F (36.6 ?C)  ?SpO2: 100%  ? ? ?KPS: 90. ?General: Alert, cooperative, pleasant, in no acute distress ?Head: Normal ?EENT: No conjunctival injection or scleral icterus.  ?Lungs: Resp effort normal ?Cardiac: Regular rate ?Abdomen: Non-distended abdomen ?Skin: No rashes cyanosis or petechiae. ?Extremities: No clubbing or edema ? ?Neurologic Exam: ?Mental Status: Awake, alert, attentive to examiner. Oriented to self and environment. Language is fluent with intact comprehension.  ?Cranial Nerves: Visual acuity is grossly normal. Visual fields are full. Extra-ocular movements intact. No ptosis. Face is symmetric ?Motor: Tone and  bulk are normal. Power is full in both arms and legs. Reflexes are symmetric, no pathologic reflexes present.  ?Sensory: Intact to light touch ?Gait: Normal. ? ? ?Labs: ?I have reviewed the data as listed ?

## 2021-05-12 ENCOUNTER — Telehealth: Payer: Self-pay | Admitting: Internal Medicine

## 2021-05-12 NOTE — Telephone Encounter (Signed)
Scheduled per 3/16 los, message has been left with pt ?

## 2021-05-16 ENCOUNTER — Other Ambulatory Visit (HOSPITAL_COMMUNITY): Payer: Self-pay

## 2021-05-21 ENCOUNTER — Other Ambulatory Visit: Payer: Self-pay | Admitting: Oncology

## 2021-05-22 ENCOUNTER — Encounter: Payer: Self-pay | Admitting: Oncology

## 2021-05-22 DIAGNOSIS — Z9889 Other specified postprocedural states: Secondary | ICD-10-CM | POA: Diagnosis not present

## 2021-05-22 DIAGNOSIS — Z48811 Encounter for surgical aftercare following surgery on the nervous system: Secondary | ICD-10-CM | POA: Diagnosis not present

## 2021-05-22 DIAGNOSIS — C642 Malignant neoplasm of left kidney, except renal pelvis: Secondary | ICD-10-CM | POA: Diagnosis not present

## 2021-05-22 DIAGNOSIS — C7931 Secondary malignant neoplasm of brain: Secondary | ICD-10-CM | POA: Diagnosis not present

## 2021-05-25 ENCOUNTER — Inpatient Hospital Stay: Payer: PPO

## 2021-05-25 ENCOUNTER — Inpatient Hospital Stay (HOSPITAL_BASED_OUTPATIENT_CLINIC_OR_DEPARTMENT_OTHER): Payer: PPO | Admitting: Oncology

## 2021-05-25 ENCOUNTER — Telehealth: Payer: Self-pay | Admitting: *Deleted

## 2021-05-25 ENCOUNTER — Encounter: Payer: Self-pay | Admitting: *Deleted

## 2021-05-25 ENCOUNTER — Encounter: Payer: Self-pay | Admitting: Oncology

## 2021-05-25 VITALS — BP 126/48 | HR 63 | Temp 97.8°F | Resp 18 | Ht 71.0 in | Wt 235.0 lb

## 2021-05-25 DIAGNOSIS — C642 Malignant neoplasm of left kidney, except renal pelvis: Secondary | ICD-10-CM

## 2021-05-25 DIAGNOSIS — Z5112 Encounter for antineoplastic immunotherapy: Secondary | ICD-10-CM | POA: Diagnosis not present

## 2021-05-25 DIAGNOSIS — D649 Anemia, unspecified: Secondary | ICD-10-CM

## 2021-05-25 DIAGNOSIS — C649 Malignant neoplasm of unspecified kidney, except renal pelvis: Secondary | ICD-10-CM

## 2021-05-25 LAB — CMP (CANCER CENTER ONLY)
ALT: 12 U/L (ref 0–44)
AST: 9 U/L — ABNORMAL LOW (ref 15–41)
Albumin: 3.9 g/dL (ref 3.5–5.0)
Alkaline Phosphatase: 63 U/L (ref 38–126)
Anion gap: 7 (ref 5–15)
BUN: 16 mg/dL (ref 8–23)
CO2: 29 mmol/L (ref 22–32)
Calcium: 9.6 mg/dL (ref 8.9–10.3)
Chloride: 103 mmol/L (ref 98–111)
Creatinine: 0.75 mg/dL (ref 0.61–1.24)
GFR, Estimated: >60 mL/min (ref >60)
Glucose, Bld: 102 mg/dL — ABNORMAL HIGH (ref 70–99)
Potassium: 4.6 mmol/L (ref 3.5–5.1)
Sodium: 139 mmol/L (ref 135–145)
Total Bilirubin: 0.3 mg/dL (ref 0.3–1.2)
Total Protein: 6.7 g/dL (ref 6.5–8.1)

## 2021-05-25 LAB — CBC WITH DIFFERENTIAL (CANCER CENTER ONLY)
Abs Immature Granulocytes: 0.03 10*3/uL (ref 0.00–0.07)
Basophils Absolute: 0.1 10*3/uL (ref 0.0–0.1)
Basophils Relative: 1 %
Eosinophils Absolute: 0.8 10*3/uL — ABNORMAL HIGH (ref 0.0–0.5)
Eosinophils Relative: 8 %
HCT: 29.1 % — ABNORMAL LOW (ref 39.0–52.0)
Hemoglobin: 8.8 g/dL — ABNORMAL LOW (ref 13.0–17.0)
Immature Granulocytes: 0 %
Lymphocytes Relative: 17 %
Lymphs Abs: 1.7 10*3/uL (ref 0.7–4.0)
MCH: 26.5 pg (ref 26.0–34.0)
MCHC: 30.2 g/dL (ref 30.0–36.0)
MCV: 87.7 fL (ref 80.0–100.0)
Monocytes Absolute: 0.8 10*3/uL (ref 0.1–1.0)
Monocytes Relative: 8 %
Neutro Abs: 6.7 10*3/uL (ref 1.7–7.7)
Neutrophils Relative %: 66 %
Platelet Count: 322 10*3/uL (ref 150–400)
RBC: 3.32 MIL/uL — ABNORMAL LOW (ref 4.22–5.81)
RDW: 15.1 % (ref 11.5–15.5)
WBC Count: 10 10*3/uL (ref 4.0–10.5)
nRBC: 0 % (ref 0.0–0.2)

## 2021-05-25 MED ORDER — HEPARIN SOD (PORK) LOCK FLUSH 100 UNIT/ML IV SOLN
500.0000 [IU] | Freq: Once | INTRAVENOUS | Status: AC | PRN
  Administered 2021-05-25: 500 [IU]

## 2021-05-25 MED ORDER — SODIUM CHLORIDE 0.9 % IV SOLN
200.0000 mg | Freq: Once | INTRAVENOUS | Status: AC
  Administered 2021-05-25: 200 mg via INTRAVENOUS
  Filled 2021-05-25: qty 8

## 2021-05-25 MED ORDER — SODIUM CHLORIDE 0.9% FLUSH
10.0000 mL | INTRAVENOUS | Status: DC | PRN
  Administered 2021-05-25: 10 mL

## 2021-05-25 MED ORDER — SODIUM CHLORIDE 0.9 % IV SOLN: Freq: Once | INTRAVENOUS | Status: AC

## 2021-05-25 NOTE — Telephone Encounter (Signed)
MD noted that prior Hgb was 10.7 and returned at 8.8 today. Called and left Phillip [name] voice mail asking if he has had any bleeding from urine or stool? ?

## 2021-05-25 NOTE — Patient Instructions (Signed)
[facility]  Discharge Instructions: Thank you for choosing [facility] to provide your oncology and hematology care.   If you have a lab appointment with the Cancer Center, please go directly to the Cancer Center and check in at the registration area.   Wear comfortable clothing and clothing appropriate for easy access to any Portacath or PICC line.   We strive to give you quality time with your provider. You may need to reschedule your appointment if you arrive late (15 or more minutes).  Arriving late affects you and other patients whose appointments are after yours.  Also, if you miss three or more appointments without notifying the office, you may be dismissed from the clinic at the provider's discretion.      For prescription refill requests, have your pharmacy contact our office and allow 72 hours for refills to be completed.    Today you received the following chemotherapy and/or immunotherapy agents Keytruda      To help prevent nausea and vomiting after your treatment, we encourage you to take your nausea medication as directed.  BELOW ARE SYMPTOMS THAT SHOULD BE REPORTED IMMEDIATELY: *FEVER GREATER THAN 100.4 F (38 C) OR HIGHER *CHILLS OR SWEATING *NAUSEA AND VOMITING THAT IS NOT CONTROLLED WITH YOUR NAUSEA MEDICATION *UNUSUAL SHORTNESS OF BREATH *UNUSUAL BRUISING OR BLEEDING *URINARY PROBLEMS (pain or burning when urinating, or frequent urination) *BOWEL PROBLEMS (unusual diarrhea, constipation, pain near the anus) TENDERNESS IN MOUTH AND THROAT WITH OR WITHOUT PRESENCE OF ULCERS (sore throat, sores in mouth, or a toothache) UNUSUAL RASH, SWELLING OR PAIN  UNUSUAL VAGINAL DISCHARGE OR ITCHING   Items with * indicate a potential emergency and should be followed up as soon as possible or go to the Emergency Department if any problems should occur.  Please show the CHEMOTHERAPY ALERT CARD or IMMUNOTHERAPY ALERT CARD at check-in to the  Emergency Department and triage nurse.  Should you have questions after your visit or need to cancel or reschedule your appointment, please contact [facility]  Dept: [phone]  and follow the prompts.  Office hours are 8:00 a.m. to 4:30 p.m. Monday - Friday. Please note that voicemails left after 4:00 p.m. may not be returned until the following business day.  We are closed weekends and major holidays. You have access to a nurse at all times for urgent questions. Please call the main number to the clinic Dept: [phone] and follow the prompts.   For any non-urgent questions, you may also contact your provider using MyChart. We now offer e-Visits for anyone 18 and older to request care online for non-urgent symptoms. For details visit [email or web address].   Also download the MyChart app! Go to the app store, search "MyChart", open the app, select [place], and log in with your MyChart username and password.  Due to Covid, a mask is required upon entering the hospital/clinic. If you do not have a mask, one will be given to you upon arrival. For doctor visits, patients may have 1 support person aged 18 or older with them. For treatment visits, patients cannot have anyone with them due to current Covid guidelines and our immunocompromised population.   

## 2021-05-25 NOTE — Progress Notes (Signed)
?[facility] ?OFFICE PROGRESS NOTE ? ? ?Diagnosis: Renal cell carcinoma ? ?INTERVAL HISTORY:  ? ?Phillip [name] returns as scheduled.  He saw Dr. Marcello Moores to evaluate the gluteal drainage.  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  The drainage has improved.  He is no longer taking antibiotics. ?No new neurologic symptoms.  He has experienced increased leg swelling for the past week.  No erythema.  He has chronic pain in the right leg and intermittent pain in the left upper leg. ?The pathology from the LIPP procedure revealed sparse malignant cells consistent with metastatic renal cell carcinoma and a predominant background of necrosis, gliosis, and calcifications. ? ?Objective: ? ?Vital signs in last 24 hours: ? ?Blood pressure (!) 126/48, pulse 63, temperature 97.8 ?F (36.6 ?C), temperature source Oral, resp. rate 18, height $RemoveBe'5\' 11"'fbSKMfRft$  (1.803 m), weight 235 lb (106.6 kg), SpO2 100 %. ? ?Resp: Lungs clear bilaterally ?Cardio: Regular rate and rhythm ?GI: No hepatosplenomegaly, nontender ?Vascular: Trace pitting edema at the right greater than left ankle and foot, no erythema  ?Skin: Mild erythema and superficial opening measuring several millimeters at the upper gluteal fold ? ?Portacath/PICC-without erythema ? ?Lab Results: ? ?Lab Results  ?Component Value Date  ? WBC 10.0 05/25/2021  ? HGB 8.8 (L) 05/25/2021  ? HCT 29.1 (L) 05/25/2021  ? MCV 87.7 05/25/2021  ? PLT 322 05/25/2021  ? NEUTROABS 6.7 05/25/2021  ? ? ?CMP  ?Lab Results  ?Component Value Date  ? NA 139 05/25/2021  ? K 4.6 05/25/2021  ? CL 103 05/25/2021  ? CO2 29 05/25/2021  ? GLUCOSE 102 (H) 05/25/2021  ? BUN 16 05/25/2021  ? CREATININE 0.75 05/25/2021  ? CALCIUM 9.6 05/25/2021  ? PROT 6.7 05/25/2021  ? ALBUMIN 3.9 05/25/2021  ? AST 9 (L) 05/25/2021  ? ALT 12 05/25/2021  ? ALKPHOS 63 05/25/2021  ? BILITOT 0.3 05/25/2021  ? GFRNONAA >60 05/25/2021  ? GFRAA >60 11/05/2019  ? ? ?Lab Results  ?Component Value Date  ? CEA1 1.77 01/04/2016  ? ? ?Lab  Results  ?Component Value Date  ? INR 0.98 05/16/2016  ? LABPROT 13.0 05/16/2016  ? ? ?Imaging: ? ?No results found. ? ?Medications: I have reviewed the patient's current medications. ? ? ?Assessment/Plan: ?Metastatic renal cell carcinoma ?L4 mass with extraosseous extension, L4 nerve compression ?Biopsy of the L4 mass 11/18/2015 confirmed metastatic renal cell carcinoma, clear cell type ?CTs of the chest, abdomen, and pelvis 11/18/2015-right lower lobe nodule, expansile lytic lesion at the right 11th rib/costal vertebral junction, left renal mass, expansile lesion involving the L4 vertebra, lytic lesion at the left acetabulum, and a low-attenuation liver lesion ?Initiation of SRS to L4 12/02/2015, Completed 12/12/2015 ?Initiation of Pazopanib 12/30/2015 ?Pazopanib placed on hold 02/06/2016 secondary to elevated liver enzymes ?Pazopanib resumed 03/07/2016 at a dose of 400 mg daily ? Pazopanib discontinued 03/19/2016 secondary to elevated liver enzymes ?Restaging CTs 04/02/2016-stable left renal mass, decreased soft tissue component associated with the L4 metastasis, increased soft tissue component associated with the right 11th rib metastasis with increased T11 bony destruction, increased sclerosis at the left acetabulum lesion ?Cycle 1 nivolumab 04/12/2016 ?Cycle 2 nivolumab 04/26/2016 ?Cycle 3 nivolumab 05/11/2016 ?Cycle 4 nivolumab 05/24/2016 ?Cycle 5 nivolumab 06/08/2016 ?MRI lumbar spine 06/21/2016-unchanged tumor at L3, increased size of retroperitoneal lymph nodes compared to a CT from 04/02/2016 ?Cycle 6 nivolumab  06/22/2016 ?CTs chest, abdomen, and pelvis 07/04/2016-enlargement of the left renal mass, right adrenal nodule, left hilar and peritoneal lymph nodes, enlargement  of left acetabular lesion. Stable lung nodules. ?Cycle 7 nivolumab 07/06/2016 ?Cycle 8 nivolumab 07/20/2016 ?Cycle 9 nivolumab 08/02/2016 ?Cycle 10 nivolumab 08/20/2016 ?Restaging CT 09/03/2016 evaluation with stable disease ?Cycle 11  nivolumab 09/05/2016 ?Cycle 12 nivolumab 09/19/2016 ?Cycle 13 nivolumab 10/03/2016 ?Cycle 14 nivolumab 10/17/2016 ?Cycle 15 nivolumab 10/31/2016 ?Cycle 16 nivolumab 11/14/2016 (changed to monthly schedule) ?Cycle 17 nivolumab 12/19/2016 ?CTs 01/21/2017-increased left renal mass, increased size of adrenal metastases, increased lytic bone lesions, increased left lung nodule, persistent tumor at L4 with probable epidural component ?Initiation of Cabozantinib 01/28/2017 ?Restaging CTs 05/30/2017- decreased size of left hilar mass, left renal mass, retroperitoneal adenopathy, and adrenal metastasis.  Healing bone lesions. ?Cabozantinib continued ?CTs 10/21/2017- interval enlargement left hilar lymph node; stable rib lesions; stable mass left renal cortex; stable mildly nodular adrenal glands; stable lytic lesions within the pelvis and spine. ?Cabozantinib continued ?CTs 02/21/2018- enlargement of an AP window lymph node.  Mildly enlarged left hilar lymph node is unchanged.  Primary renal cell carcinoma involving the upper pole of the left kidney appears similar.  Stable enlarged right periaortic lymph node adjacent to the renal vessels.  Stable multifocal bony metastatic disease. ?Cabozantinib continued ?CTs 07/29/2018- stable 15 mm AP window nodes; stable left hilar node; subcarinal node slightly larger; right periaortic node 16 mm, previously 12 mm; portacaval node 24 mm, previously 16 mm; 15 mm node superior to the pancreatic head has enlarged; interval increase nodularity of both adrenal glands; stable left kidney mass; multifocal bony metastatic disease not significantly changed. ?Cabozantinib continued ?CTs 11/06/2018- moderate improvement in thoracic adenopathy; minimal improvement abdominal adenopathy; left kidney upper pole mass and various lytic expansile bone lesions stable; mild increase in nodularity of left adrenal gland; right adrenal gland nodularity stable. 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  Peripheral enhancing masses consistent with metastases. No hemorrhage. ?MRI brain 03/11/2019-7 enhancing brain masses consistent with metastatic disease ?SRS to 7 brain lesions, treatment given 03/19/2019, 03/23/2019, and 03/25/2019 ?CTs 04/09/2019-decrease in left renal mass, bilateral adrenal nodules, AP window and porta hepatic adenopathy.  New 9 mm right lower lobe nodule.  Improved lytic lesion of the left acetabulum.  Other bone lesions are stable. ?Cabozantinib continued ?MRI brain 07/17/2019-resolution of 4 mm treated lesion in the right frontal cortex, 6 remaining treated lesions have decreased in size, new punctate metastasis in the superior right cerebellum ?SRS to right cerebellar lesion 07/30/2019 ?CTs 08/17/2019-previously noted right lower lobe nodule resolved, stable left kidney mass, mixed lytic/sclerotic bone lesions in the thoracolumbar spine, right posterior ribs, and left acetabulum-unchanged, no evidence of progressive disease  ?Cabozantinib continued ?MRI brain 10/23/2019-stable to slight decrease in size of multiple enhancing intracranial lesions.  Slight increase in surrounding edema in the medial left frontal lobe.  No new lesions present. ?MRI brain 01/29/2020-multiple enhancing brain lesions, some hemorrhagic, some with mild enlargement-treatment effect? ?CTs 02/08/2020-no thoracic metastases, enlargement of the left renal mass, enlargement of lytic lesion at T9, other lytic lesions unchanged ?MRI brain 05/04/2020-no new lesions, slight enlargement of a right occipital lesion, other lesions are stable or decreased in size ?CTs 08/11/2020-enlargement of left kidney mass, new 1.6 cm segment 7 liver lesion, enlargement of a lytic lesion at T9, increased lysis of a sclerotic lesion at the right second rib, 0.7 cm endoluminal nodule at the bladder dome-enlarged ?Brain MRI 08/10/2020-slight increase in a 15  mm left frontal lobe lesion, new punctate focus in the right occipital cortex, other lesions  stable or slightly decreased ?CTs 11/01/2020-increase in left suprahilar opacity, enlargement of previous liver met

## 2021-05-25 NOTE — Progress Notes (Signed)
Patient seen by Dr. Sherrill today ? ?Vitals are within treatment parameters. ? ?Labs reviewed by Dr. Sherrill and are within treatment parameters. ? ?Per physician team, patient is ready for treatment and there are NO modifications to the treatment plan.  ?

## 2021-05-25 NOTE — Progress Notes (Signed)
Patient presents for treatment. RN assessment completed along with the following: ? ?Labs/vitals reviewed - Yes, and within treatment parameters.   ?Weight within 10% of previous measurement - Yes ?Informed consent completed and reflects current therapy/intent - Yes, on date 11/30/2020             ?Provider progress note reviewed - Today's provider note is not yet available. I reviewed the most recent oncology provider progress note in chart dated 05/01/2021. ?Treatment/Antibody/Supportive plan reviewed - Yes, and there are no adjustments needed for today's treatment. ?S&H and other orders reviewed - Yes, and there are no additional orders identified. ?Previous treatment date reviewed - Yes, and the appropriate amount of time has elapsed between treatments. ?Clinic Hand Off Received from - Cristy Friedlander, RN ? ?Patient to proceed with treatment.   ?

## 2021-05-26 ENCOUNTER — Encounter: Payer: Self-pay | Admitting: Oncology

## 2021-05-26 ENCOUNTER — Telehealth: Payer: Self-pay | Admitting: *Deleted

## 2021-05-26 NOTE — Telephone Encounter (Signed)
Called to [name] to call attention to drop in Hgb over last 3 weeks. Mr. Wojdyla denies any bleeding from bowels or bladder and his pilonidal fistula is no longer bleeding. Agree to recheck labs with blood bank on 4/3. Scheduler will call Ms. Wise with the appointment. ?

## 2021-05-29 ENCOUNTER — Inpatient Hospital Stay: Payer: PPO

## 2021-05-29 ENCOUNTER — Telehealth: Payer: Self-pay | Admitting: *Deleted

## 2021-05-29 ENCOUNTER — Inpatient Hospital Stay: Payer: PPO | Attending: Nurse Practitioner

## 2021-05-29 VITALS — BP 145/73 | HR 47 | Temp 98.2°F | Resp 20

## 2021-05-29 DIAGNOSIS — C642 Malignant neoplasm of left kidney, except renal pelvis: Secondary | ICD-10-CM | POA: Insufficient documentation

## 2021-05-29 DIAGNOSIS — C7951 Secondary malignant neoplasm of bone: Secondary | ICD-10-CM | POA: Insufficient documentation

## 2021-05-29 DIAGNOSIS — D649 Anemia, unspecified: Secondary | ICD-10-CM

## 2021-05-29 DIAGNOSIS — I10 Essential (primary) hypertension: Secondary | ICD-10-CM | POA: Diagnosis not present

## 2021-05-29 DIAGNOSIS — C7931 Secondary malignant neoplasm of brain: Secondary | ICD-10-CM | POA: Diagnosis not present

## 2021-05-29 DIAGNOSIS — E538 Deficiency of other specified B group vitamins: Secondary | ICD-10-CM | POA: Diagnosis not present

## 2021-05-29 DIAGNOSIS — C787 Secondary malignant neoplasm of liver and intrahepatic bile duct: Secondary | ICD-10-CM | POA: Diagnosis not present

## 2021-05-29 DIAGNOSIS — Z79899 Other long term (current) drug therapy: Secondary | ICD-10-CM | POA: Diagnosis not present

## 2021-05-29 DIAGNOSIS — C7972 Secondary malignant neoplasm of left adrenal gland: Secondary | ICD-10-CM | POA: Insufficient documentation

## 2021-05-29 LAB — CBC WITH DIFFERENTIAL (CANCER CENTER ONLY)
Abs Immature Granulocytes: 0.04 10*3/uL (ref 0.00–0.07)
Basophils Absolute: 0.1 10*3/uL (ref 0.0–0.1)
Basophils Relative: 1 %
Eosinophils Absolute: 0.7 10*3/uL — ABNORMAL HIGH (ref 0.0–0.5)
Eosinophils Relative: 9 %
HCT: 28.7 % — ABNORMAL LOW (ref 39.0–52.0)
Hemoglobin: 8.9 g/dL — ABNORMAL LOW (ref 13.0–17.0)
Immature Granulocytes: 1 %
Lymphocytes Relative: 21 %
Lymphs Abs: 1.7 10*3/uL (ref 0.7–4.0)
MCH: 26.2 pg (ref 26.0–34.0)
MCHC: 31 g/dL (ref 30.0–36.0)
MCV: 84.4 fL (ref 80.0–100.0)
Monocytes Absolute: 0.7 10*3/uL (ref 0.1–1.0)
Monocytes Relative: 9 %
Neutro Abs: 4.9 10*3/uL (ref 1.7–7.7)
Neutrophils Relative %: 59 %
Platelet Count: 309 10*3/uL (ref 150–400)
RBC: 3.4 MIL/uL — ABNORMAL LOW (ref 4.22–5.81)
RDW: 14.3 % (ref 11.5–15.5)
WBC Count: 8.2 10*3/uL (ref 4.0–10.5)
nRBC: 0 % (ref 0.0–0.2)

## 2021-05-29 LAB — SAMPLE TO BLOOD BANK

## 2021-05-29 MED ORDER — HEPARIN SOD (PORK) LOCK FLUSH 100 UNIT/ML IV SOLN
500.0000 [IU] | Freq: Once | INTRAVENOUS | Status: AC
  Administered 2021-05-29: 500 [IU] via INTRAVENOUS

## 2021-05-29 MED ORDER — SODIUM CHLORIDE 0.9% FLUSH
10.0000 mL | Freq: Once | INTRAVENOUS | Status: AC
  Administered 2021-05-29: 10 mL via INTRAVENOUS

## 2021-05-29 NOTE — Telephone Encounter (Signed)
-----   Message from [name], MD sent at 05/29/2021  1:35 PM EDT ----- ?Please call patient, hemoglobin remains low and is stable, call for symptoms of anemia, follow-up as scheduled, and CBC and blood bank sample to next visit ? ?

## 2021-05-29 NOTE — Telephone Encounter (Signed)
Notified wife of Hgb remains low, but stable. She will call w/any symptoms of anemia or bleeding. Will recheck CBC/blood bank sample at next scheduled visit. ?

## 2021-05-31 ENCOUNTER — Other Ambulatory Visit (HOSPITAL_COMMUNITY): Payer: Self-pay

## 2021-06-05 ENCOUNTER — Other Ambulatory Visit: Payer: Self-pay | Admitting: Nurse Practitioner

## 2021-06-05 ENCOUNTER — Other Ambulatory Visit (HOSPITAL_COMMUNITY): Payer: Self-pay

## 2021-06-05 DIAGNOSIS — C649 Malignant neoplasm of unspecified kidney, except renal pelvis: Secondary | ICD-10-CM

## 2021-06-06 ENCOUNTER — Other Ambulatory Visit (HOSPITAL_COMMUNITY): Payer: Self-pay

## 2021-06-07 ENCOUNTER — Other Ambulatory Visit (HOSPITAL_COMMUNITY): Payer: Self-pay

## 2021-06-07 ENCOUNTER — Encounter: Payer: Self-pay | Admitting: Oncology

## 2021-06-07 ENCOUNTER — Other Ambulatory Visit: Payer: Self-pay | Admitting: Oncology

## 2021-06-07 DIAGNOSIS — C649 Malignant neoplasm of unspecified kidney, except renal pelvis: Secondary | ICD-10-CM

## 2021-06-07 MED ORDER — OXYCODONE HCL ER 10 MG PO T12A
EXTENDED_RELEASE_TABLET | Freq: Two times a day (BID) | ORAL | 0 refills | Status: DC
  Filled 2021-06-07: qty 120, 30d supply, fill #0

## 2021-06-07 MED ORDER — OXYCODONE HCL 5 MG PO TABS
5.0000 mg | ORAL_TABLET | Freq: Four times a day (QID) | ORAL | 0 refills | Status: DC | PRN
  Filled 2021-06-07: qty 60, 15d supply, fill #0

## 2021-06-08 ENCOUNTER — Other Ambulatory Visit: Payer: Self-pay | Admitting: Oncology

## 2021-06-15 ENCOUNTER — Telehealth: Payer: Self-pay

## 2021-06-15 ENCOUNTER — Encounter: Payer: Self-pay | Admitting: Nurse Practitioner

## 2021-06-15 ENCOUNTER — Other Ambulatory Visit (HOSPITAL_COMMUNITY): Payer: Self-pay

## 2021-06-15 ENCOUNTER — Telehealth: Payer: Self-pay | Admitting: Internal Medicine

## 2021-06-15 ENCOUNTER — Telehealth: Payer: Self-pay | Admitting: *Deleted

## 2021-06-15 ENCOUNTER — Inpatient Hospital Stay (HOSPITAL_BASED_OUTPATIENT_CLINIC_OR_DEPARTMENT_OTHER): Payer: PPO | Admitting: Nurse Practitioner

## 2021-06-15 ENCOUNTER — Inpatient Hospital Stay: Payer: PPO

## 2021-06-15 VITALS — BP 144/80 | HR 98 | Temp 98.2°F | Resp 18 | Ht 71.0 in | Wt 240.0 lb

## 2021-06-15 DIAGNOSIS — C642 Malignant neoplasm of left kidney, except renal pelvis: Secondary | ICD-10-CM | POA: Diagnosis not present

## 2021-06-15 DIAGNOSIS — C649 Malignant neoplasm of unspecified kidney, except renal pelvis: Secondary | ICD-10-CM

## 2021-06-15 DIAGNOSIS — D649 Anemia, unspecified: Secondary | ICD-10-CM

## 2021-06-15 LAB — CBC WITH DIFFERENTIAL (CANCER CENTER ONLY)
Abs Immature Granulocytes: 0.01 10*3/uL (ref 0.00–0.07)
Basophils Absolute: 0 10*3/uL (ref 0.0–0.1)
Basophils Relative: 1 %
Eosinophils Absolute: 0.4 10*3/uL (ref 0.0–0.5)
Eosinophils Relative: 5 %
HCT: 34.1 % — ABNORMAL LOW (ref 39.0–52.0)
Hemoglobin: 10.4 g/dL — ABNORMAL LOW (ref 13.0–17.0)
Immature Granulocytes: 0 %
Lymphocytes Relative: 20 %
Lymphs Abs: 1.5 10*3/uL (ref 0.7–4.0)
MCH: 26.3 pg (ref 26.0–34.0)
MCHC: 30.5 g/dL (ref 30.0–36.0)
MCV: 86.3 fL (ref 80.0–100.0)
Monocytes Absolute: 0.6 10*3/uL (ref 0.1–1.0)
Monocytes Relative: 8 %
Neutro Abs: 5 10*3/uL (ref 1.7–7.7)
Neutrophils Relative %: 66 %
Platelet Count: 230 10*3/uL (ref 150–400)
RBC: 3.95 MIL/uL — ABNORMAL LOW (ref 4.22–5.81)
RDW: 15.9 % — ABNORMAL HIGH (ref 11.5–15.5)
WBC Count: 7.5 10*3/uL (ref 4.0–10.5)
nRBC: 0 % (ref 0.0–0.2)

## 2021-06-15 LAB — CMP (CANCER CENTER ONLY)
ALT: 12 U/L (ref 0–44)
AST: 11 U/L — ABNORMAL LOW (ref 15–41)
Albumin: 3.9 g/dL (ref 3.5–5.0)
Alkaline Phosphatase: 86 U/L (ref 38–126)
Anion gap: 8 (ref 5–15)
BUN: 14 mg/dL (ref 8–23)
CO2: 29 mmol/L (ref 22–32)
Calcium: 9 mg/dL (ref 8.9–10.3)
Chloride: 103 mmol/L (ref 98–111)
Creatinine: 0.98 mg/dL (ref 0.61–1.24)
GFR, Estimated: >60 mL/min (ref >60)
Glucose, Bld: 101 mg/dL — ABNORMAL HIGH (ref 70–99)
Potassium: 4.1 mmol/L (ref 3.5–5.1)
Sodium: 140 mmol/L (ref 135–145)
Total Bilirubin: 0.4 mg/dL (ref 0.3–1.2)
Total Protein: 6.5 g/dL (ref 6.5–8.1)

## 2021-06-15 LAB — TOTAL PROTEIN, URINE DIPSTICK: Protein, ur: >300 mg/dL — AB

## 2021-06-15 LAB — SAMPLE TO BLOOD BANK

## 2021-06-15 LAB — TSH: TSH: 1.745 u[IU]/mL (ref 0.350–4.500)

## 2021-06-15 MED ORDER — FUROSEMIDE 20 MG PO TABS
20.0000 mg | ORAL_TABLET | Freq: Every day | ORAL | 0 refills | Status: DC
  Filled 2021-06-15: qty 30, 30d supply, fill #0

## 2021-06-15 MED ORDER — HEPARIN SOD (PORK) LOCK FLUSH 100 UNIT/ML IV SOLN
500.0000 [IU] | Freq: Once | INTRAVENOUS | Status: AC
  Administered 2021-06-15: 500 [IU] via INTRAVENOUS

## 2021-06-15 MED ORDER — SODIUM CHLORIDE 0.9% FLUSH
10.0000 mL | Freq: Once | INTRAVENOUS | Status: AC
  Administered 2021-06-15: 10 mL via INTRAVENOUS

## 2021-06-15 NOTE — Telephone Encounter (Signed)
-----   Message from Owens Shark, NP sent at 06/15/2021  1:12 PM EDT ----- ?Please let his significant other (Ms. Cristela Blue) know the urine protein is elevated.  Place lenvatinib on hold.  Provide them with a 24-hour urine collection container to take with them today.  Also please let her know it is not time for the OxyContin or oxycodone to be refilled.  Thanks ? ?

## 2021-06-15 NOTE — Telephone Encounter (Signed)
Called Mrs. Wise and let her know the urine protein is elevated. Place lenvatinib on hold. Patient received urine container, and I also let her know the Oxys are not ready to be refill. Mrs. Wise verbal understanding. ?

## 2021-06-15 NOTE — Telephone Encounter (Signed)
Notified Phillip [name] that urine protein was elevated at 300 and he needs to complete a 24 hour urine. Need to put lenvatinib on hold for now. Also too soon to fill the oxycontin and oxycodone. She understands and agrees. They already have the urine collection container. ?

## 2021-06-15 NOTE — Progress Notes (Signed)
?[name] ?OFFICE PROGRESS NOTE ? ? ?Diagnosis: Renal cell carcinoma ? ?INTERVAL HISTORY:  ? ?Phillip [name] returns as scheduled.  He resumed lenvatinib following the last office visit.  He completed a cycle of Pembrolizumab 05/25/2021.  He is accompanied by his significant other.  She reports his blood pressure has been higher.  Losartan/HCTZ was resumed about 5 days ago.  He has increased leg edema.  She notices he is having some word finding difficulty.  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 ?Objective: ? ?Vital signs in last 24 hours: ? ?Blood pressure (!) 144/80, pulse 98, temperature 98.2 ?F (36.8 ?C), temperature source Oral, resp. rate 18, height '5\' 11"'$  (1.803 m), weight 240 lb (108.9 kg), SpO2 100 %. ?  ? ?HEENT: No thrush or ulcers. ?Resp: Lungs clear bilaterally. ?Cardio: Regular rate and rhythm. ?GI: Abdomen soft and nontender. ?Vascular: Pitting edema lower legs. ?Neuro: Flat affect.  Follows commands.  Some word finding difficulty. ?Skin: Acne appearing rash at the upper chest. ? ?Lab Results: ? ?Lab Results  ?Component Value Date  ? WBC 7.5 06/15/2021  ? HGB 10.4 (L) 06/15/2021  ? HCT 34.1 (L) 06/15/2021  ? MCV 86.3 06/15/2021  ? PLT 230 06/15/2021  ? NEUTROABS 5.0 06/15/2021  ? ? ?Imaging: ? ?No results found. ? ?Medications: I have reviewed the patient's current medications. ? ?Assessment/Plan: ?Metastatic renal cell carcinoma ?L4 mass with extraosseous extension, L4 nerve compression ?Biopsy of the L4 mass 11/18/2015 confirmed metastatic renal cell carcinoma, clear cell type ?CTs of the chest, abdomen, and pelvis 11/18/2015-right lower lobe nodule, expansile lytic lesion at the right 11th rib/costal vertebral junction, left renal mass, expansile lesion involving the L4 vertebra, lytic lesion at the left acetabulum, and a low-attenuation liver lesion ?Initiation of SRS to L4 12/02/2015, Completed 12/12/2015 ?Initiation of Pazopanib 12/30/2015 ?Pazopanib placed on hold 02/06/2016  secondary to elevated liver enzymes ?Pazopanib resumed 03/07/2016 at a dose of 400 mg daily ? Pazopanib discontinued 03/19/2016 secondary to elevated liver enzymes ?Restaging CTs 04/02/2016-stable left renal mass, decreased soft tissue component associated with the L4 metastasis, increased soft tissue component associated with the right 11th rib metastasis with increased T11 bony destruction, increased sclerosis at the left acetabulum lesion ?Cycle 1 nivolumab 04/12/2016 ?Cycle 2 nivolumab 04/26/2016 ?Cycle 3 nivolumab 05/11/2016 ?Cycle 4 nivolumab 05/24/2016 ?Cycle 5 nivolumab 06/08/2016 ?MRI lumbar spine 06/21/2016-unchanged tumor at L3, increased size of retroperitoneal lymph nodes compared to a CT from 04/02/2016 ?Cycle 6 nivolumab  06/22/2016 ?CTs chest, abdomen, and pelvis 07/04/2016-enlargement of the left renal mass, right adrenal nodule, left hilar and peritoneal lymph nodes, enlargement of left acetabular lesion. Stable lung nodules. ?Cycle 7 nivolumab 07/06/2016 ?Cycle 8 nivolumab 07/20/2016 ?Cycle 9 nivolumab 08/02/2016 ?Cycle 10 nivolumab 08/20/2016 ?Restaging CT 09/03/2016 evaluation with stable disease ?Cycle 11 nivolumab 09/05/2016 ?Cycle 12 nivolumab 09/19/2016 ?Cycle 13 nivolumab 10/03/2016 ?Cycle 14 nivolumab 10/17/2016 ?Cycle 15 nivolumab 10/31/2016 ?Cycle 16 nivolumab 11/14/2016 (changed to monthly schedule) ?Cycle 17 nivolumab 12/19/2016 ?CTs 01/21/2017-increased left renal mass, increased size of adrenal metastases, increased lytic bone lesions, increased left lung nodule, persistent tumor at L4 with probable epidural component ?Initiation of Cabozantinib 01/28/2017 ?Restaging CTs 05/30/2017- decreased size of left hilar mass, left renal mass, retroperitoneal adenopathy, and adrenal metastasis.  Healing bone lesions. ?Cabozantinib continued ?CTs 10/21/2017- interval enlargement left hilar lymph node; stable rib lesions; stable mass left renal cortex; stable mildly nodular adrenal glands; stable  lytic lesions within the pelvis and spine. ?Cabozantinib continued ?CTs 02/21/2018- enlargement of an AP  window lymph node.  Mildly enlarged left hilar lymph node is unchanged.  Primary renal cell carcinoma involving the upper pole of the left kidney appears similar.  Stable enlarged right periaortic lymph node adjacent to the renal vessels.  Stable multifocal bony metastatic disease. ?Cabozantinib continued ?CTs 07/29/2018- stable 15 mm AP window nodes; stable left hilar node; subcarinal node slightly larger; right periaortic node 16 mm, previously 12 mm; portacaval node 24 mm, previously 16 mm; 15 mm node superior to the pancreatic head has enlarged; interval increase nodularity of both adrenal glands; stable left kidney mass; multifocal bony metastatic disease not significantly changed. ?Cabozantinib continued ?CTs 11/06/2018- moderate improvement in thoracic adenopathy; minimal improvement abdominal adenopathy; left kidney upper pole mass and various lytic expansile bone lesions stable; mild increase in nodularity of left adrenal gland; right adrenal gland nodularity stable. ?Cabozantinib continued ?Clinical evidence of partial seizure activity December 2020 ?CT head 02/25/2019-extensive vasogenic edema, left greater than right.  Peripheral enhancing masses consistent with metastases. No hemorrhage. ?MRI brain 03/11/2019-7 enhancing brain masses consistent with metastatic disease ?SRS to 7 brain lesions, treatment given 03/19/2019, 03/23/2019, and 03/25/2019 ?CTs 04/09/2019-decrease in left renal mass, bilateral adrenal nodules, AP window and porta hepatic adenopathy.  New 9 mm right lower lobe nodule.  Improved lytic lesion of the left acetabulum.  Other bone lesions are stable. ?Cabozantinib continued ?MRI brain 07/17/2019-resolution of 4 mm treated lesion in the right frontal cortex, 6 remaining treated lesions have decreased in size, new punctate metastasis in the superior right cerebellum ?SRS to right cerebellar  lesion 07/30/2019 ?CTs 08/17/2019-previously noted right lower lobe nodule resolved, stable left kidney mass, mixed lytic/sclerotic bone lesions in the thoracolumbar spine, right posterior ribs, and left acetabulum-unchanged, no evidence of progressive disease  ?Cabozantinib continued ?MRI brain 10/23/2019-stable to slight decrease in size of multiple enhancing intracranial lesions.  Slight increase in surrounding edema in the medial left frontal lobe.  No new lesions present. ?MRI brain 01/29/2020-multiple enhancing brain lesions, some hemorrhagic, some with mild enlargement-treatment effect? ?CTs 02/08/2020-no thoracic metastases, enlargement of the left renal mass, enlargement of lytic lesion at T9, other lytic lesions unchanged ?MRI brain 05/04/2020-no new lesions, slight enlargement of a right occipital lesion, other lesions are stable or decreased in size ?CTs 08/11/2020-enlargement of left kidney mass, new 1.6 cm segment 7 liver lesion, enlargement of a lytic lesion at T9, increased lysis of a sclerotic lesion at the right second rib, 0.7 cm endoluminal nodule at the bladder dome-enlarged ?Brain MRI 08/10/2020-slight increase in a 15 mm left frontal lobe lesion, new punctate focus in the right occipital cortex, other lesions stable or slightly decreased ?CTs 11/01/2020-increase in left suprahilar opacity, enlargement of previous liver metastases, several new subcentimeter lesions, increase in left renal mass, similar appearance of bone metastases, stable hyperdense/enhancing nodule in the left bladder ?Brain MRI 11/04/2020-no change in brain metastases ?Lenvatinib/pembrolizumab 11/30/2020 ?CTs 03/03/2021-stable left hilar lymph nodes; resolution of left upper lobe perihilar nodularity; unchanged for millimeter peripheral right upper lobe nodule; multiple lytic bone lesions appear stable; left kidney lesion decreased in size; bilateral adrenal nodules decreased in size; several liver lesion showed mild increase in size;  several new small lesions within the right hepatic lobe. ?Lenvatinib/pembrolizumab continued 03/07/2021 ?Brain MRI 03/10/2021-progression of a left frontal metastasis, tiny new metastasis in the right frontal lobe-

## 2021-06-15 NOTE — Telephone Encounter (Signed)
Patient's wife called in and asking if it's ok to  start the urine collection on Sunday. I let the patient's wife know it is ok to start on Sunday and returned the urine on Monday. ?

## 2021-06-15 NOTE — Telephone Encounter (Signed)
.  Called patient to schedule appointment per 4/20 inbasket, patient is aware of date and time.   ?

## 2021-06-15 NOTE — Progress Notes (Signed)
Per Jerene Canny. LPN treatment being held due to Urine Protein >300. Pt will collect a 24 hour urine and return as scheduled ?

## 2021-06-18 DIAGNOSIS — C642 Malignant neoplasm of left kidney, except renal pelvis: Secondary | ICD-10-CM | POA: Diagnosis not present

## 2021-06-19 ENCOUNTER — Other Ambulatory Visit: Payer: Self-pay | Admitting: *Deleted

## 2021-06-19 ENCOUNTER — Other Ambulatory Visit (HOSPITAL_BASED_OUTPATIENT_CLINIC_OR_DEPARTMENT_OTHER): Payer: Self-pay

## 2021-06-19 DIAGNOSIS — Z95828 Presence of other vascular implants and grafts: Secondary | ICD-10-CM

## 2021-06-19 DIAGNOSIS — C649 Malignant neoplasm of unspecified kidney, except renal pelvis: Secondary | ICD-10-CM

## 2021-06-20 LAB — PROTEIN, URINE, 24 HOUR
Collection Interval-UPROT: 24 hours
Protein, 24H Urine: 252 mg/d — ABNORMAL HIGH (ref 50–100)
Protein, Urine: 28 mg/dL
Urine Total Volume-UPROT: 900 mL

## 2021-06-21 ENCOUNTER — Telehealth: Payer: Self-pay

## 2021-06-21 NOTE — Telephone Encounter (Signed)
Patient's wife called in stated the patient lasix is not working, weight is still the same, and legs are still swollen. She wanted to know if the patient can  start his Lenvima back. Discuss this with Elby Showers.Thomas. Per Lattie Haw he needs to stop his lasix and resume Lenvima. And also Dr. [name] thinks the swelling is multifactorial. I advice the patient if the symptoms get worse to call the office. Patient's wife gave verbal understanding. ?

## 2021-06-22 ENCOUNTER — Other Ambulatory Visit: Payer: Self-pay | Admitting: Radiation Therapy

## 2021-06-23 ENCOUNTER — Other Ambulatory Visit: Payer: Self-pay | Admitting: Oncology

## 2021-06-23 ENCOUNTER — Other Ambulatory Visit (HOSPITAL_COMMUNITY): Payer: Self-pay

## 2021-06-23 MED FILL — Lenvatinib Cap Ther Pack 10 MG & 2 x 4 MG (18 MG Daily Dose): ORAL | 30 days supply | Qty: 90 | Fill #0 | Status: AC

## 2021-07-04 ENCOUNTER — Other Ambulatory Visit: Payer: Self-pay | Admitting: Nurse Practitioner

## 2021-07-04 ENCOUNTER — Other Ambulatory Visit (HOSPITAL_COMMUNITY): Payer: Self-pay

## 2021-07-04 ENCOUNTER — Other Ambulatory Visit: Payer: Self-pay | Admitting: Oncology

## 2021-07-04 DIAGNOSIS — C649 Malignant neoplasm of unspecified kidney, except renal pelvis: Secondary | ICD-10-CM

## 2021-07-04 MED ORDER — OXYCODONE HCL ER 10 MG PO T12A
EXTENDED_RELEASE_TABLET | Freq: Two times a day (BID) | ORAL | 0 refills | Status: DC
  Filled 2021-07-04: qty 120, fill #0
  Filled 2021-07-05: qty 120, 30d supply, fill #0

## 2021-07-04 MED ORDER — OXYCODONE HCL 5 MG PO TABS
5.0000 mg | ORAL_TABLET | Freq: Four times a day (QID) | ORAL | 0 refills | Status: DC | PRN
  Filled 2021-07-04: qty 60, 15d supply, fill #0

## 2021-07-04 MED ORDER — TESTOSTERONE 1.62 % TD GEL
TRANSDERMAL | 3 refills | Status: DC
  Filled 2021-07-04: qty 75, 30d supply, fill #0

## 2021-07-04 MED ORDER — LEVETIRACETAM 500 MG PO TABS
ORAL_TABLET | Freq: Two times a day (BID) | ORAL | 2 refills | Status: DC
Start: 2021-07-04 — End: 2021-08-21
  Filled 2021-07-04: qty 60, 30d supply, fill #0
  Filled 2021-08-08: qty 60, 30d supply, fill #1

## 2021-07-05 ENCOUNTER — Other Ambulatory Visit (HOSPITAL_COMMUNITY): Payer: Self-pay

## 2021-07-06 ENCOUNTER — Other Ambulatory Visit (HOSPITAL_COMMUNITY): Payer: Self-pay

## 2021-07-07 ENCOUNTER — Ambulatory Visit: Payer: PPO

## 2021-07-07 ENCOUNTER — Other Ambulatory Visit: Payer: PPO

## 2021-07-07 ENCOUNTER — Ambulatory Visit: Payer: PPO | Admitting: Oncology

## 2021-07-10 ENCOUNTER — Other Ambulatory Visit (HOSPITAL_COMMUNITY): Payer: Self-pay

## 2021-07-10 ENCOUNTER — Encounter: Payer: Self-pay | Admitting: *Deleted

## 2021-07-10 ENCOUNTER — Inpatient Hospital Stay: Payer: PPO | Attending: Nurse Practitioner

## 2021-07-10 ENCOUNTER — Inpatient Hospital Stay: Payer: PPO

## 2021-07-10 ENCOUNTER — Inpatient Hospital Stay (HOSPITAL_BASED_OUTPATIENT_CLINIC_OR_DEPARTMENT_OTHER): Payer: PPO | Admitting: Oncology

## 2021-07-10 VITALS — BP 145/68 | HR 61 | Temp 98.0°F | Resp 17

## 2021-07-10 VITALS — BP 154/78 | HR 65 | Temp 98.2°F | Resp 18 | Ht 71.0 in | Wt 238.2 lb

## 2021-07-10 DIAGNOSIS — C7931 Secondary malignant neoplasm of brain: Secondary | ICD-10-CM | POA: Insufficient documentation

## 2021-07-10 DIAGNOSIS — C642 Malignant neoplasm of left kidney, except renal pelvis: Secondary | ICD-10-CM | POA: Insufficient documentation

## 2021-07-10 DIAGNOSIS — C649 Malignant neoplasm of unspecified kidney, except renal pelvis: Secondary | ICD-10-CM

## 2021-07-10 DIAGNOSIS — I129 Hypertensive chronic kidney disease with stage 1 through stage 4 chronic kidney disease, or unspecified chronic kidney disease: Secondary | ICD-10-CM | POA: Diagnosis not present

## 2021-07-10 DIAGNOSIS — Z5112 Encounter for antineoplastic immunotherapy: Secondary | ICD-10-CM | POA: Diagnosis not present

## 2021-07-10 DIAGNOSIS — Z807 Family history of other malignant neoplasms of lymphoid, hematopoietic and related tissues: Secondary | ICD-10-CM | POA: Diagnosis not present

## 2021-07-10 DIAGNOSIS — N189 Chronic kidney disease, unspecified: Secondary | ICD-10-CM | POA: Diagnosis not present

## 2021-07-10 LAB — CBC WITH DIFFERENTIAL (CANCER CENTER ONLY)
Abs Immature Granulocytes: 0.03 10*3/uL (ref 0.00–0.07)
Basophils Absolute: 0.1 10*3/uL (ref 0.0–0.1)
Basophils Relative: 1 %
Eosinophils Absolute: 0.4 10*3/uL (ref 0.0–0.5)
Eosinophils Relative: 4 %
HCT: 40 % (ref 39.0–52.0)
Hemoglobin: 12.1 g/dL — ABNORMAL LOW (ref 13.0–17.0)
Immature Granulocytes: 0 %
Lymphocytes Relative: 23 %
Lymphs Abs: 2 10*3/uL (ref 0.7–4.0)
MCH: 25.8 pg — ABNORMAL LOW (ref 26.0–34.0)
MCHC: 30.3 g/dL (ref 30.0–36.0)
MCV: 85.3 fL (ref 80.0–100.0)
Monocytes Absolute: 0.7 10*3/uL (ref 0.1–1.0)
Monocytes Relative: 7 %
Neutro Abs: 5.9 10*3/uL (ref 1.7–7.7)
Neutrophils Relative %: 65 %
Platelet Count: 281 10*3/uL (ref 150–400)
RBC: 4.69 MIL/uL (ref 4.22–5.81)
RDW: 16 % — ABNORMAL HIGH (ref 11.5–15.5)
WBC Count: 9 10*3/uL (ref 4.0–10.5)
nRBC: 0 % (ref 0.0–0.2)

## 2021-07-10 LAB — CMP (CANCER CENTER ONLY)
ALT: 14 U/L (ref 0–44)
AST: 11 U/L — ABNORMAL LOW (ref 15–41)
Albumin: 4.1 g/dL (ref 3.5–5.0)
Alkaline Phosphatase: 81 U/L (ref 38–126)
Anion gap: 7 (ref 5–15)
BUN: 14 mg/dL (ref 8–23)
CO2: 31 mmol/L (ref 22–32)
Calcium: 9.2 mg/dL (ref 8.9–10.3)
Chloride: 101 mmol/L (ref 98–111)
Creatinine: 0.83 mg/dL (ref 0.61–1.24)
GFR, Estimated: >60 mL/min (ref >60)
Glucose, Bld: 137 mg/dL — ABNORMAL HIGH (ref 70–99)
Potassium: 4.2 mmol/L (ref 3.5–5.1)
Sodium: 139 mmol/L (ref 135–145)
Total Bilirubin: 0.3 mg/dL (ref 0.3–1.2)
Total Protein: 6.7 g/dL (ref 6.5–8.1)

## 2021-07-10 MED ORDER — SODIUM CHLORIDE 0.9 % IV SOLN: Freq: Once | INTRAVENOUS | Status: AC

## 2021-07-10 MED ORDER — SODIUM CHLORIDE 0.9% FLUSH
10.0000 mL | INTRAVENOUS | Status: DC | PRN
  Administered 2021-07-10: 10 mL

## 2021-07-10 MED ORDER — PREDNISONE 50 MG PO TABS
ORAL_TABLET | ORAL | 0 refills | Status: DC
  Filled 2021-07-10: qty 3, 1d supply, fill #0

## 2021-07-10 MED ORDER — SODIUM CHLORIDE 0.9 % IV SOLN
200.0000 mg | Freq: Once | INTRAVENOUS | Status: AC
  Administered 2021-07-10: 200 mg via INTRAVENOUS
  Filled 2021-07-10: qty 8

## 2021-07-10 MED ORDER — HEPARIN SOD (PORK) LOCK FLUSH 100 UNIT/ML IV SOLN
500.0000 [IU] | Freq: Once | INTRAVENOUS | Status: AC | PRN
  Administered 2021-07-10: 500 [IU]

## 2021-07-10 NOTE — Progress Notes (Signed)
?[place] ?OFFICE PROGRESS NOTE ? ? ?Diagnosis: Renal cell carcinoma ? ?INTERVAL HISTORY:  ? ?Phillip [name] returns as scheduled.  He is here with his significant other.  The leg edema improved significantly a few weeks ago.  This was after discontinuing Lasix.  He resumed Lenvima after the 24-hour urine protein returned only mildly elevated.  No bleeding or symptom of thrombosis.  He has been more confused.  No seizure.  Pain is stable.  He has malaise. ? ?Objective: ? ?Vital signs in last 24 hours: ? ?Blood pressure (!) 154/78, pulse 65, temperature 98.2 ?F (36.8 ?C), temperature source Oral, resp. rate 18, height '5\' 11"'$  (1.803 m), weight 238 lb 3.2 oz (108 kg), SpO2 97 %. ?  ? ?HEENT: No thrush or ulcers ?Resp: Lungs clear bilaterally ?Cardio: Regular rhythm with premature beats and occasional pause ?GI: No hepatosplenomegaly ?Vascular: Trace pitting edema at the right greater than left lower leg ?Neuro: Alert, follows commands, ambulates to the exam table ?  ? ?Portacath/PICC-without erythema ? ?Lab Results: ? ?Lab Results  ?Component Value Date  ? WBC 9.0 07/10/2021  ? HGB 12.1 (L) 07/10/2021  ? HCT 40.0 07/10/2021  ? MCV 85.3 07/10/2021  ? PLT 281 07/10/2021  ? NEUTROABS 5.9 07/10/2021  ? ? ?CMP  ?Lab Results  ?Component Value Date  ? NA 139 07/10/2021  ? K 4.2 07/10/2021  ? CL 101 07/10/2021  ? CO2 31 07/10/2021  ? GLUCOSE 137 (H) 07/10/2021  ? BUN 14 07/10/2021  ? CREATININE 0.83 07/10/2021  ? CALCIUM 9.2 07/10/2021  ? PROT 6.7 07/10/2021  ? ALBUMIN 4.1 07/10/2021  ? AST 11 (L) 07/10/2021  ? ALT 14 07/10/2021  ? ALKPHOS 81 07/10/2021  ? BILITOT 0.3 07/10/2021  ? GFRNONAA >60 07/10/2021  ? GFRAA >60 11/05/2019  ? ? ?Lab Results  ?Component Value Date  ? CEA1 1.77 01/04/2016  ? ? ?Medications: I have reviewed the patient's current medications. ? ? ?Assessment/Plan: ? ?Metastatic renal cell carcinoma ?L4 mass with extraosseous extension, L4 nerve compression ?Biopsy of the L4 mass 11/18/2015  confirmed metastatic renal cell carcinoma, clear cell type ?CTs of the chest, abdomen, and pelvis 11/18/2015-right lower lobe nodule, expansile lytic lesion at the right 11th rib/costal vertebral junction, left renal mass, expansile lesion involving the L4 vertebra, lytic lesion at the left acetabulum, and a low-attenuation liver lesion ?Initiation of SRS to L4 12/02/2015, Completed 12/12/2015 ?Initiation of Pazopanib 12/30/2015 ?Pazopanib placed on hold 02/06/2016 secondary to elevated liver enzymes ?Pazopanib resumed 03/07/2016 at a dose of 400 mg daily ? Pazopanib discontinued 03/19/2016 secondary to elevated liver enzymes ?Restaging CTs 04/02/2016-stable left renal mass, decreased soft tissue component associated with the L4 metastasis, increased soft tissue component associated with the right 11th rib metastasis with increased T11 bony destruction, increased sclerosis at the left acetabulum lesion ?Cycle 1 nivolumab 04/12/2016 ?Cycle 2 nivolumab 04/26/2016 ?Cycle 3 nivolumab 05/11/2016 ?Cycle 4 nivolumab 05/24/2016 ?Cycle 5 nivolumab 06/08/2016 ?MRI lumbar spine 06/21/2016-unchanged tumor at L3, increased size of retroperitoneal lymph nodes compared to a CT from 04/02/2016 ?Cycle 6 nivolumab  06/22/2016 ?CTs chest, abdomen, and pelvis 07/04/2016-enlargement of the left renal mass, right adrenal nodule, left hilar and peritoneal lymph nodes, enlargement of left acetabular lesion. Stable lung nodules. ?Cycle 7 nivolumab 07/06/2016 ?Cycle 8 nivolumab 07/20/2016 ?Cycle 9 nivolumab 08/02/2016 ?Cycle 10 nivolumab 08/20/2016 ?Restaging CT 09/03/2016 evaluation with stable disease ?Cycle 11 nivolumab 09/05/2016 ?Cycle 12 nivolumab 09/19/2016 ?Cycle 13 nivolumab 10/03/2016 ?Cycle 14 nivolumab 10/17/2016 ?Cycle 15 nivolumab  10/31/2016 ?Cycle 16 nivolumab 11/14/2016 (changed to monthly schedule) ?Cycle 17 nivolumab 12/19/2016 ?CTs 01/21/2017-increased left renal mass, increased size of adrenal metastases, increased lytic  bone lesions, increased left lung nodule, persistent tumor at L4 with probable epidural component ?Initiation of Cabozantinib 01/28/2017 ?Restaging CTs 05/30/2017- decreased size of left hilar mass, left renal mass, retroperitoneal adenopathy, and adrenal metastasis.  Healing bone lesions. ?Cabozantinib continued ?CTs 10/21/2017- interval enlargement left hilar lymph node; stable rib lesions; stable mass left renal cortex; stable mildly nodular adrenal glands; stable lytic lesions within the pelvis and spine. ?Cabozantinib continued ?CTs 02/21/2018- enlargement of an AP window lymph node.  Mildly enlarged left hilar lymph node is unchanged.  Primary renal cell carcinoma involving the upper pole of the left kidney appears similar.  Stable enlarged right periaortic lymph node adjacent to the renal vessels.  Stable multifocal bony metastatic disease. ?Cabozantinib continued ?CTs 07/29/2018- stable 15 mm AP window nodes; stable left hilar node; subcarinal node slightly larger; right periaortic node 16 mm, previously 12 mm; portacaval node 24 mm, previously 16 mm; 15 mm node superior to the pancreatic head has enlarged; interval increase nodularity of both adrenal glands; stable left kidney mass; multifocal bony metastatic disease not significantly changed. ?Cabozantinib continued ?CTs 11/06/2018- moderate improvement in thoracic adenopathy; minimal improvement abdominal adenopathy; left kidney upper pole mass and various lytic expansile bone lesions stable; mild increase in nodularity of left adrenal gland; right adrenal gland nodularity stable. ?Cabozantinib continued ?Clinical evidence of partial seizure activity December 2020 ?CT head 02/25/2019-extensive vasogenic edema, left greater than right.  Peripheral enhancing masses consistent with metastases. No hemorrhage. ?MRI brain 03/11/2019-7 enhancing brain masses consistent with metastatic disease ?SRS to 7 brain lesions, treatment given 03/19/2019, 03/23/2019, and  03/25/2019 ?CTs 04/09/2019-decrease in left renal mass, bilateral adrenal nodules, AP window and porta hepatic adenopathy.  New 9 mm right lower lobe nodule.  Improved lytic lesion of the left acetabulum.  Other bone lesions are stable. ?Cabozantinib continued ?MRI brain 07/17/2019-resolution of 4 mm treated lesion in the right frontal cortex, 6 remaining treated lesions have decreased in size, new punctate metastasis in the superior right cerebellum ?SRS to right cerebellar lesion 07/30/2019 ?CTs 08/17/2019-previously noted right lower lobe nodule resolved, stable left kidney mass, mixed lytic/sclerotic bone lesions in the thoracolumbar spine, right posterior ribs, and left acetabulum-unchanged, no evidence of progressive disease  ?Cabozantinib continued ?MRI brain 10/23/2019-stable to slight decrease in size of multiple enhancing intracranial lesions.  Slight increase in surrounding edema in the medial left frontal lobe.  No new lesions present. ?MRI brain 01/29/2020-multiple enhancing brain lesions, some hemorrhagic, some with mild enlargement-treatment effect? ?CTs 02/08/2020-no thoracic metastases, enlargement of the left renal mass, enlargement of lytic lesion at T9, other lytic lesions unchanged ?MRI brain 05/04/2020-no new lesions, slight enlargement of a right occipital lesion, other lesions are stable or decreased in size ?CTs 08/11/2020-enlargement of left kidney mass, new 1.6 cm segment 7 liver lesion, enlargement of a lytic lesion at T9, increased lysis of a sclerotic lesion at the right second rib, 0.7 cm endoluminal nodule at the bladder dome-enlarged ?Brain MRI 08/10/2020-slight increase in a 15 mm left frontal lobe lesion, new punctate focus in the right occipital cortex, other lesions stable or slightly decreased ?CTs 11/01/2020-increase in left suprahilar opacity, enlargement of previous liver metastases, several new subcentimeter lesions, increase in left renal mass, similar appearance of bone metastases,  stable hyperdense/enhancing nodule in the left bladder ?Brain MRI 11/04/2020-no change in brain metastases ?Lenvatinib/pembrolizumab 11/30/2020 ?CTs 03/03/2021-stable left hilar  lymph nodes; resolution of left upper lobe pe

## 2021-07-10 NOTE — Progress Notes (Signed)
Patient seen by Dr. Sherrill today ? ?Vitals are within treatment parameters. ? ?Labs reviewed by Dr. Sherrill and are within treatment parameters. ? ?Per physician team, patient is ready for treatment and there are NO modifications to the treatment plan.  ?

## 2021-07-10 NOTE — Patient Instructions (Signed)

## 2021-07-10 NOTE — Patient Instructions (Signed)
[facility]   ?Discharge Instructions: ?Thank you for choosing [facility] to provide your oncology and hematology care.  ? ?If you have a lab appointment with the Cancer Center, please go directly to the Cancer Center and check in at the registration area. ?  ?Wear comfortable clothing and clothing appropriate for easy access to any Portacath or PICC line.  ? ?We strive to give you quality time with your provider. You may need to reschedule your appointment if you arrive late (15 or more minutes).  Arriving late affects you and other patients whose appointments are after yours.  Also, if you miss three or more appointments without notifying the office, you may be dismissed from the clinic at the provider?s discretion.    ?  ?For prescription refill requests, have your pharmacy contact our office and allow 72 hours for refills to be completed.   ? ?Today you received the following chemotherapy and/or immunotherapy agents Keytruda    ?  ?To help prevent nausea and vomiting after your treatment, we encourage you to take your nausea medication as directed. ? ?BELOW ARE SYMPTOMS THAT SHOULD BE REPORTED IMMEDIATELY: ?*FEVER GREATER THAN 100.4 F (38 ?C) OR HIGHER ?*CHILLS OR SWEATING ?*NAUSEA AND VOMITING THAT IS NOT CONTROLLED WITH YOUR NAUSEA MEDICATION ?*UNUSUAL SHORTNESS OF BREATH ?*UNUSUAL BRUISING OR BLEEDING ?*URINARY PROBLEMS (pain or burning when urinating, or frequent urination) ?*BOWEL PROBLEMS (unusual diarrhea, constipation, pain near the anus) ?TENDERNESS IN MOUTH AND THROAT WITH OR WITHOUT PRESENCE OF ULCERS (sore throat, sores in mouth, or a toothache) ?UNUSUAL RASH, SWELLING OR PAIN  ?UNUSUAL VAGINAL DISCHARGE OR ITCHING  ? ?Items with * indicate a potential emergency and should be followed up as soon as possible or go to the Emergency Department if any problems should occur. ? ?Please show the CHEMOTHERAPY ALERT CARD or IMMUNOTHERAPY ALERT CARD at check-in to the  Emergency Department and triage nurse. ? ?Should you have questions after your visit or need to cancel or reschedule your appointment, please contact [facility]  Dept: [phone]  and follow the prompts.  Office hours are 8:00 a.m. to 4:30 p.m. Monday - Friday. Please note that voicemails left after 4:00 p.m. may not be returned until the following business day.  We are closed weekends and major holidays. You have access to a nurse at all times for urgent questions. Please call the main number to the clinic Dept: [phone] and follow the prompts. ? ? ?For any non-urgent questions, you may also contact your provider using MyChart. We now offer e-Visits for anyone 18 and older to request care online for non-urgent symptoms. For details visit [email or web address]. ?  ?Also download the MyChart app! Go to the app store, search "MyChart", open the app, select [facility], and log in with your MyChart username and password. ? ?Due to Covid, a mask is required upon entering the hospital/clinic. If you do not have a mask, one will be given to you upon arrival. For doctor visits, patients may have 1 support person aged 18 or older with them. For treatment visits, patients cannot have anyone with them due to current Covid guidelines and our immunocompromised population.  ? ?Pembrolizumab injection ?What is this medication? ?PEMBROLIZUMAB (pem broe liz ue mab) is a monoclonal antibody. It is used to treat certain types of cancer. ?This medicine may be used for other purposes; ask your health care provider or pharmacist if you have questions. ?COMMON BRAND NAME(S): Keytruda ?  What should I tell my care team before I take this medication? ?They need to know if you have any of these conditions: ?autoimmune diseases like Crohn's disease, ulcerative colitis, or lupus ?have had or planning to have an allogeneic stem cell transplant (uses someone else's stem cells) ?history of organ  transplant ?history of chest radiation ?nervous system problems like myasthenia gravis or Guillain-Barre syndrome ?an unusual or allergic reaction to pembrolizumab, other medicines, foods, dyes, or preservatives ?pregnant or trying to get pregnant ?breast-feeding ?How should I use this medication? ?This medicine is for infusion into a vein. It is given by a health care professional in a hospital or clinic setting. ?A special MedGuide will be given to you before each treatment. Be sure to read this information carefully each time. ?Talk to your pediatrician regarding the use of this medicine in children. While this drug may be prescribed for children as young as 6 months for selected conditions, precautions do apply. ?Overdosage: If you think you have taken too much of this medicine contact a poison control center or emergency room at once. ?NOTE: This medicine is only for you. Do not share this medicine with others. ?What if I miss a dose? ?It is important not to miss your dose. Call your doctor or health care professional if you are unable to keep an appointment. ?What may interact with this medication? ?Interactions have not been studied. ?This list may not describe all possible interactions. Give your health care provider a list of all the medicines, herbs, non-prescription drugs, or dietary supplements you use. Also tell them if you smoke, drink alcohol, or use illegal drugs. Some items may interact with your medicine. ?What should I watch for while using this medication? ?Your condition will be monitored carefully while you are receiving this medicine. ?You may need blood work done while you are taking this medicine. ?Do not become pregnant while taking this medicine or for 4 months after stopping it. Women should inform their doctor if they wish to become pregnant or think they might be pregnant. There is a potential for serious side effects to an unborn child. Talk to your health care professional or  pharmacist for more information. Do not breast-feed an infant while taking this medicine or for 4 months after the last dose. ?What side effects may I notice from receiving this medication? ?Side effects that you should report to your doctor or health care professional as soon as possible: ?allergic reactions like skin rash, itching or hives, swelling of the face, lips, or tongue ?bloody or black, tarry ?breathing problems ?changes in vision ?chest pain ?chills ?confusion ?constipation ?cough ?diarrhea ?dizziness or feeling faint or lightheaded ?fast or irregular heartbeat ?fever ?flushing ?joint pain ?low blood counts - this medicine may decrease the number of white blood cells, red blood cells and platelets. You may be at increased risk for infections and bleeding. ?muscle pain ?muscle weakness ?pain, tingling, numbness in the hands or feet ?persistent headache ?redness, blistering, peeling or loosening of the skin, including inside the mouth ?signs and symptoms of high blood sugar such as dizziness; dry mouth; dry skin; fruity breath; nausea; stomach pain; increased hunger or thirst; increased urination ?signs and symptoms of kidney injury like trouble passing urine or change in the amount of urine ?signs and symptoms of liver injury like dark urine, light-colored stools, loss of appetite, nausea, right upper belly pain, yellowing of the eyes or skin ?sweating ?swollen lymph nodes ?weight loss ?Side effects that usually do not require   medical attention (report to your doctor or health care professional if they continue or are bothersome): ?decreased appetite ?hair loss ?tiredness ?This list may not describe all possible side effects. Call your doctor for medical advice about side effects. You may report side effects to FDA at 1-800-FDA-1088. ?Where should I keep my medication? ?This drug is given in a hospital or clinic and will not be stored at home. ?NOTE: This sheet is a summary. It may not cover all possible  information. If you have questions about this medicine, talk to your doctor, pharmacist, or health care provider. ?? 2023 [employer] Standard (2021-01-13 00:00:00) ? ?

## 2021-07-11 ENCOUNTER — Ambulatory Visit: Payer: PPO | Admitting: Internal Medicine

## 2021-07-13 ENCOUNTER — Other Ambulatory Visit: Payer: Self-pay | Admitting: Radiation Therapy

## 2021-07-13 DIAGNOSIS — C7931 Secondary malignant neoplasm of brain: Secondary | ICD-10-CM

## 2021-07-14 ENCOUNTER — Ambulatory Visit
Admission: RE | Admit: 2021-07-14 | Discharge: 2021-07-14 | Disposition: A | Discharge status: Home / Self Care | Payer: PPO | Source: Ambulatory Visit | Attending: Internal Medicine | Admitting: Internal Medicine

## 2021-07-14 DIAGNOSIS — Z95828 Presence of other vascular implants and grafts: Secondary | ICD-10-CM

## 2021-07-14 DIAGNOSIS — C729 Malignant neoplasm of central nervous system, unspecified: Secondary | ICD-10-CM | POA: Diagnosis not present

## 2021-07-14 DIAGNOSIS — C7931 Secondary malignant neoplasm of brain: Secondary | ICD-10-CM

## 2021-07-14 MED ORDER — HEPARIN SOD (PORK) LOCK FLUSH 100 UNIT/ML IV SOLN
500.0000 [IU] | Freq: Once | INTRAVENOUS | Status: AC
  Administered 2021-07-14: 500 [IU] via INTRAVENOUS

## 2021-07-14 MED ORDER — SODIUM CHLORIDE 0.9% FLUSH
10.0000 mL | INTRAVENOUS | Status: DC | PRN
  Administered 2021-07-14: 10 mL via INTRAVENOUS

## 2021-07-14 MED ORDER — GADOBENATE DIMEGLUMINE 529 MG/ML IV SOLN
20.0000 mL | Freq: Once | INTRAVENOUS | Status: AC | PRN
  Administered 2021-07-14: 20 mL via INTRAVENOUS

## 2021-07-17 ENCOUNTER — Inpatient Hospital Stay: Payer: PPO

## 2021-07-17 ENCOUNTER — Other Ambulatory Visit (HOSPITAL_COMMUNITY): Payer: Self-pay

## 2021-07-18 ENCOUNTER — Inpatient Hospital Stay (HOSPITAL_BASED_OUTPATIENT_CLINIC_OR_DEPARTMENT_OTHER): Payer: PPO | Admitting: Internal Medicine

## 2021-07-18 VITALS — BP 167/77 | HR 58 | Temp 97.7°F | Resp 18 | Wt 234.5 lb

## 2021-07-18 DIAGNOSIS — C7931 Secondary malignant neoplasm of brain: Secondary | ICD-10-CM | POA: Diagnosis not present

## 2021-07-18 DIAGNOSIS — R569 Unspecified convulsions: Secondary | ICD-10-CM | POA: Diagnosis not present

## 2021-07-18 DIAGNOSIS — Z5112 Encounter for antineoplastic immunotherapy: Secondary | ICD-10-CM | POA: Diagnosis not present

## 2021-07-18 NOTE — Progress Notes (Signed)
[facility] [place], [place] [address] [phone]   Interval Evaluation  Date of Service: 07/18/21 Patient Name: Phillip [name] Patient MRN: [id number] Patient DOB: 11-08-1945 Provider: [name], MD  Identifying Statement:  Phillip [name] is a 76 y.o. 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  Today he complains of some brain fog, short term memory issues.  He has resumed keytr[place]atinib with Dr. SherrilBenay Spicees headaches and seizures. Remains active at home and at church.   Medications: Current Outpatient Medications on File Prior to Visit  Medication Sig Dispense Refill   acetaminophen (TYLENOL) 650 MG CR tablet Take 650 mg by mouth every 8 (eight) hours as needed for pain.     acetaminophen (TYLENOL) 650 MG CR tablet Take by mouth.     docusate sodium (COLACE) 100 MG capsule Take 100 mg by mouth 2 (two) times daily.     famotidine (PEPCID) 10 MG tablet Take 10 mg by mouth 2 (two) times daily.     furosemide (LASIX) 20 MG tablet Take 1 tablet by mouth daily. Call office with update on leg edema after taking for 1 week 30 tablet 0   hydrocortisone (CORTEF) 10 MG tablet  TAKE 2 TABLETS BY MOUTH IN THE MORNING AND 1 TABLET IN THE EVENING 90 tablet 3   ibuprofen (ADVIL,MOTRIN) 200 MG tablet Take 200 mg by mouth every 8 (eight) hours as needed for fever, headache, mild pain, moderate pain or cramping.      lenvatinib 18 mg daily dose (LENVIMA, 18 MG DAILY DOSE,) 10 MG & 2 x 4 MG capsule Take 18 mg by mouth daily. 90 capsule 0   levETIRAcetam (KEPPRA) 500 MG tablet TAKE 1 TABLET BY MOUTH 2 TIMES DAILY 60 tablet 2   lidocaine-prilocaine (EMLA) cream APPLY TO PORTACATH 1 HOUR PRIOR TO USE AS NEEDED 30 g 2   LORazepam (ATIVAN) 0.5 MG tablet Take 1-2 tablets (0.5-1 mg total) by mouth every 6 (six) hours as needed for anxiety. 60 tablet 2   losartan-hydrochlorothiazide (HYZAAR) 100-12.5 MG tablet Take 1 tablet by mouth once a day. 90 tablet 3   mirtazapine (REMERON) 30 MG tablet TAKE 1 TABLET BY MOUTH EVERY EVENING AT BEDTIME 90 tablet 5   Multiple Vitamin (MULTIVITAMIN ADULT PO) Take 1 tablet by mouth daily. [place] product     ondansetron (ZOFRAN) 8 MG tablet TAKE 1 TABLET BY MOUTH EVERY 8 HOURS AS NEEDED FOR NAUSEA AND VOMITING 90 tablet 2   oxyCODONE (OXY IR/ROXICODONE) 5 MG immediate release tablet Take 1 tablet by mouth every 6 hours as needed for severe pain.  60 tablet 0   oxyCODONE (OXYCONTIN) 10 mg 12 hr tablet TAKE 2 TABLETS BY MOUTH EVERY 12 HOURS. 120 tablet 0   pembrolizumab (KEYTRUDA) 100 MG/4ML SOLN See admin instructions.     polyethylene glycol (MIRALAX / GLYCOLAX) 17 g packet Take 17 g by mouth daily.     predniSONE (DELTASONE) 50 MG tablet Take 1  tablet by mouth 13 hours, 7 hours, and 1 hour prior to CT scan. 3 tablet 0   prochlorperazine (COMPAZINE) 10 MG tablet Take 1 tablet by mouth every 6 hours as needed for nausea or vomiting. 30 tablet 2   Testosterone 1.62 % GEL Apply 2 pumps topically daily 75 g 3   vitamin B-12 (CYANOCOBALAMIN) 1000 MCG tablet Take 1 tablet (1,000 mcg total) by mouth daily. Take 1 tablet twice daily x 3 days, then daily      Current Facility-Administered Medications on File Prior to Visit  Medication Dose Route Frequency Provider Last Rate Last Admin   sodium chloride flush (NS) 0.9 % injection 10 mL  10 mL Intravenous PRN [name], MD   10 mL at 06/22/16 0211    Allergies:  Allergies  Allergen Reactions   Contrast Media [Iodinated Contrast Media] Rash   Past Medical History:  Past Medical History:  Diagnosis Date   Anxiety    Brain cancer ([place])    Chronic kidney disease    renal cancer   Constipation    Depression    Hypertension    Low testosterone in male    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   Seizures ([place])    last seizure 01/2019 - controlled since on keppra   Wears glasses    Past Surgical History:  Past Surgical History:  Procedure Laterality Date   insertion port-a-cath  2018   IR GENERIC HISTORICAL  11/18/2015   IR FLUORO GUIDED NEEDLE PLC ASPIRATION/INJECTION LOC 11/18/2015 [name], MD [facility]-INTERV RAD   IR GENERIC HISTORICAL  05/16/2016   IR FLUORO GUIDE PORT INSERTION RIGHT 05/16/2016 [name], MD [facility]-INTERV RAD   IR GENERIC HISTORICAL  05/16/2016   IR US GUIDE VASC ACCESS RIGHT 05/16/2016 [name], MD [facility]-INTERV RAD   RADIOLOGY WITH ANESTHESIA N/A 06/16/2019   Procedure: MRI BRAIN WITH AND WITHOUT CONTRAST WITH ANESTHESIA;  Surgeon: Radiologist, Medication, MD;  Location: [facility];  Service: Radiology;  Laterality: N/A;   Social History:  Social History   Socioeconomic History   Marital status: Single    Spouse name: Not on file   Number of children: Not on file   Years of education: Not on file   Highest education level: Not on file  Occupational History   Not on file  Tobacco Use   Smoking status: Never   Smokeless tobacco: Never  Vaping Use   Vaping Use: Never used  Substance and Sexual Activity   Alcohol use: Not Currently    Alcohol/week: 1.0 standard drink    Types: 1 Glasses of wine per week   Drug use: Yes    Types:  Marijuana    Comment: Last use 06/13/19   Sexual activity: Not Currently  Other Topics Concern   Not on file  Social History Narrative   Not on file   Social Determinants of Health   Financial Resource Strain: Not on file  Food Insecurity: Not on file  Transportation Needs: Not on file  Physical Activity: Not on file  Stress: Not on file  Social Connections: Not on  file  Intimate Partner Violence: Not on file   Family History:  Family History  Problem Relation Age of Onset   Cancer Grandchild        lymphoma    Review of Systems: Constitutional: Doesn't report fevers, chills or abnormal weight loss Eyes: Doesn't report blurriness of vision Ears, nose, mouth, throat, and face: Doesn't report sore throat Respiratory: Doesn't report cough, dyspnea or wheezes Cardiovascular: Doesn't report palpitation, chest discomfort  Gastrointestinal:  Doesn't report nausea, constipation, diarrhea GU: Doesn't report incontinence Skin: Doesn't report skin rashes Neurological: Per HPI Musculoskeletal: Doesn't report joint pain Behavioral/Psych: Doesn't report anxiety  Physical Exam: Vitals:   07/18/21 1020  BP: (!) 167/77  Pulse: (!) 58  Resp: 18  Temp: 97.7 F (36.5 C)  SpO2: 98%     KPS: 90. General: Alert, cooperative, pleasant, in no acute distress Head: Normal EENT: No conjunctival injection or scleral icterus.  Lungs: Resp effort normal Cardiac: Regular rate Abdomen: Non-distended abdomen Skin: No rashes cyanosis or petechiae. Extremities: No clubbing or edema  Neurologic Exam: Mental Status: Awake, alert, attentive to examiner. Oriented to self and environment. Language is fluent with intact comprehension.  Cranial Nerves: Visual acuity is grossly normal. Visual fields are full. Extra-ocular movements intact. No ptosis. Face is symmetric Motor: Tone and bulk are normal. Power is full in both arms and legs. Reflexes are symmetric, no pathologic reflexes present.   Sensory: Intact to light touch Gait: Normal.   Labs: I have reviewed the data as listed    Component Value Date/Time   NA 139 07/10/2021 1120   NA 140 02/21/2017 1128   K 4.2 07/10/2021 1120   K 4.3 02/21/2017 1128   CL 101 07/10/2021 1120   CO2 31 07/10/2021 1120   CO2 26 02/21/2017 1128   GLUCOSE 137 (H) 07/10/2021 1120   GLUCOSE 115 02/21/2017 1128   BUN 14 07/10/2021 1120   BUN 16.6 02/21/2017 1128   CREATININE 0.83 07/10/2021 1120   CREATININE 0.8 02/21/2017 1128   CALCIUM 9.2 07/10/2021 1120   CALCIUM 9.4 02/21/2017 1128   PROT 6.7 07/10/2021 1120   PROT 7.5 02/21/2017 1128   ALBUMIN 4.1 07/10/2021 1120   ALBUMIN 4.2 02/21/2017 1128   AST 11 (L) 07/10/2021 1120   AST 20 02/21/2017 1128   ALT 14 07/10/2021 1120   ALT 43 02/21/2017 1128   ALKPHOS 81 07/10/2021 1120   ALKPHOS 104 02/21/2017 1128   BILITOT 0.3 07/10/2021 1120   BILITOT 0.36 02/21/2017 1128   GFRNONAA >60 07/10/2021 1120   GFRAA >60 11/05/2019 0957   Lab Results  Component Value Date   WBC 9.0 07/10/2021   NEUTROABS 5.9 07/10/2021   HGB 12.1 (L) 07/10/2021   HCT 40.0 07/10/2021   MCV 85.3 07/10/2021   PLT 281 07/10/2021    Case Report  [facility] Surgical Pathology Report                      Case: [id number]                                Authorizing Provider:  Bartholomew Boards, MD Collected:           04/18/2021 08:33 AM          Ordering Location:     [facility] - 1st fl Received:            04/18/2021 08:39  AM                                 [facility]                                                                  Pathologist:           Romero Liner, MD                                                        Intraop:               Romero Liner, MD                                                        Specimen:    Brain Biopsy, LEFT FRONTAL LESION:[id number]                                        Final Pathologic Diagnosis     A.  BRAIN LESION, LEFT FRONTAL, BIOPSY:               Sparse malignant cells, consistent with a metastatic renal cell carcinoma. Predominant background of necrosis, gliosis, and calcifications. See comment.    Electronically signed by Romero Liner, MD on 04/24/2021 at  5:10 PM  Comment    Immunoperoxidase studies are performed.  The tumor cells are immunoreactive for Pax8, CA9, and CD10.  The tumor cells are negative for GFAP, EMA, cytokeratin AE1/AE3, cytokeratin 7, cytokeratin 20, cytokeratin 5/6, INSM1, and TTF-1.  The findings are consistent with a metastatic poorly-differentiated carcinoma originating from the kidney.  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  The immunohistochemical controls worked appropriately.      Imaging:  [place] Clinician Interpretation: I have personally reviewed the CNS images as listed.  My interpretation, in the context of the patient's clinical presentation, is stable disease  MR BRAIN W WO CONTRAST  Result Date: 07/15/2021 CLINICAL DATA:  Brain/CNS neoplasm, assess treatment response. EXAM: MRI HEAD WITHOUT AND WITH CONTRAST TECHNIQUE: Multiplanar, multiecho pulse sequences of the brain and surrounding structures were obtained without and with intravenous contrast. CONTRAST:  20m MULTIHANCE GADOBENATE DIMEGLUMINE 529 MG/ML IV SOLN COMPARISON:  04/19/2021 and 03/10/2021 FINDINGS: Brain: No acute infarct, mass effect or extra-axial collection. Chronic deposition of blood products in the anterior left frontal lobe and at the left basal ganglia. There are multiple contrast-enhancing lesions: 1. Punctate right cerebellar hemisphere, unchanged compared to 03/10/2021 series 11, image 39 2. Anterior left temporal lobe, 2 mm, unchanged, image 44 3. Left occipital lobe, 5 mm, decreased compared to 03/10/2021, but slightly larger than on 04/19/2021, image 48 4. Right occipital lobe, 6 mm,  increased compared to 04/19/2021 but decreased from 03/10/2021, image  62 5. Left posterior parietal lobe, 5 mm, increased from 04/19/2021 but decreased from 03/10/2021, image 100 6. Parafalcine left frontal lobe, 16 mm, decreased from 22 mm, image 127 7. Superior left frontal lobe, 7 mm, decreased from 10 mm on 03/10/2021, image 141 8. 3 mm focus of contrast enhancement within the pineal gland is unchanged since 04/19/2021 but smaller compared to 03/10/2021, image 72 The midline structures are normal. Areas of hyperintense T2-weighted signal in the anterior left temporal lobe and the anterior left frontal white matter are unchanged. Vascular: Major flow voids are preserved. Skull and upper cervical spine: Normal calvarium and skull base. Visualized upper cervical spine and soft tissues are normal. Sinuses/Orbits:No paranasal sinus fluid levels or advanced mucosal thickening. No mastoid or middle ear effusion. Normal orbits. IMPRESSION: 1. Mixed changes in lesion sizes since 04/19/2021, but fall large decreased in size since 03/10/2021. The lesions that show some measured increase are irregularly enhancing and the change in size is likely due to a post treatment affect rather than progression. 2. No new lesions. Electronically Signed   By: Ulyses Jarred M.D.   On: 07/15/2021 01:47    Assessment/Plan Metastasis to brain [facility]) [C79.31]  Kallon Ajax Schroll is clinically stable today.  MRI brain also demonstrates stable findings.   Ongoing cognitive impairment is likely secondary to high burden of CNS disease, radiation therapy, leukomalacia, parenchymal atrophy.  He should continue Keppra 542m BID for seizure prevention.   We appreciate the opportunity to participate in the care of JBoyd    We ask that JDry Tavernreturn to clinic in 3 months following next brain MRI, or sooner as needed.  All questions were answered. The patient knows to call the clinic with any problems, questions or concerns. No barriers to learning were detected.  I have spent a  total of 30 minutes of face-to-face and non-face-to-face time, excluding clinical staff time, preparing to see patient, ordering tests and/or medications, counseling the patient, and independently interpreting results and communicating results to the patient/family/caregiver    ZVentura Sellers MD Medical Director of Neuro-Oncology [facility]05/23/23 10:16 AM

## 2021-07-19 ENCOUNTER — Telehealth: Payer: Self-pay | Admitting: *Deleted

## 2021-07-19 ENCOUNTER — Telehealth: Payer: Self-pay | Admitting: Internal Medicine

## 2021-07-19 NOTE — Telephone Encounter (Signed)
Left VM for Ms. Wise with CT scan appointment date/time/prep. Reminded to have him take his premeds of prednisone and benadryl as directed.

## 2021-07-19 NOTE — Telephone Encounter (Signed)
Per 5/23 los called and left pt a message with appointment details. Call back number left

## 2021-07-24 ENCOUNTER — Other Ambulatory Visit: Payer: Self-pay | Admitting: Oncology

## 2021-07-25 ENCOUNTER — Other Ambulatory Visit (HOSPITAL_COMMUNITY): Payer: Self-pay

## 2021-07-26 DIAGNOSIS — E785 Hyperlipidemia, unspecified: Secondary | ICD-10-CM | POA: Diagnosis not present

## 2021-07-26 DIAGNOSIS — I10 Essential (primary) hypertension: Secondary | ICD-10-CM | POA: Diagnosis not present

## 2021-07-28 ENCOUNTER — Other Ambulatory Visit: Payer: PPO

## 2021-07-28 ENCOUNTER — Ambulatory Visit (HOSPITAL_BASED_OUTPATIENT_CLINIC_OR_DEPARTMENT_OTHER)
Admission: RE | Admit: 2021-07-28 | Discharge: 2021-07-28 | Disposition: A | Discharge status: Home / Self Care | Payer: PPO | Source: Ambulatory Visit | Attending: Oncology | Admitting: Oncology

## 2021-07-28 ENCOUNTER — Inpatient Hospital Stay: Payer: PPO

## 2021-07-28 ENCOUNTER — Inpatient Hospital Stay: Payer: PPO | Attending: Nurse Practitioner

## 2021-07-28 DIAGNOSIS — C7951 Secondary malignant neoplasm of bone: Secondary | ICD-10-CM | POA: Insufficient documentation

## 2021-07-28 DIAGNOSIS — Z5112 Encounter for antineoplastic immunotherapy: Secondary | ICD-10-CM | POA: Insufficient documentation

## 2021-07-28 DIAGNOSIS — C649 Malignant neoplasm of unspecified kidney, except renal pelvis: Secondary | ICD-10-CM | POA: Insufficient documentation

## 2021-07-28 DIAGNOSIS — I700 Atherosclerosis of aorta: Secondary | ICD-10-CM | POA: Insufficient documentation

## 2021-07-28 DIAGNOSIS — C7931 Secondary malignant neoplasm of brain: Secondary | ICD-10-CM | POA: Insufficient documentation

## 2021-07-28 DIAGNOSIS — C7972 Secondary malignant neoplasm of left adrenal gland: Secondary | ICD-10-CM | POA: Insufficient documentation

## 2021-07-28 DIAGNOSIS — C642 Malignant neoplasm of left kidney, except renal pelvis: Secondary | ICD-10-CM | POA: Insufficient documentation

## 2021-07-28 DIAGNOSIS — Z79899 Other long term (current) drug therapy: Secondary | ICD-10-CM | POA: Insufficient documentation

## 2021-07-28 DIAGNOSIS — K769 Liver disease, unspecified: Secondary | ICD-10-CM | POA: Insufficient documentation

## 2021-07-28 DIAGNOSIS — I10 Essential (primary) hypertension: Secondary | ICD-10-CM | POA: Insufficient documentation

## 2021-07-28 DIAGNOSIS — R21 Rash and other nonspecific skin eruption: Secondary | ICD-10-CM | POA: Insufficient documentation

## 2021-07-28 DIAGNOSIS — R4701 Aphasia: Secondary | ICD-10-CM | POA: Insufficient documentation

## 2021-07-28 DIAGNOSIS — E538 Deficiency of other specified B group vitamins: Secondary | ICD-10-CM | POA: Insufficient documentation

## 2021-07-28 LAB — CMP (CANCER CENTER ONLY)
ALT: 15 U/L (ref 0–44)
AST: 11 U/L — ABNORMAL LOW (ref 15–41)
Albumin: 4.2 g/dL (ref 3.5–5.0)
Alkaline Phosphatase: 68 U/L (ref 38–126)
Anion gap: 12 (ref 5–15)
BUN: 16 mg/dL (ref 8–23)
CO2: 28 mmol/L (ref 22–32)
Calcium: 9.8 mg/dL (ref 8.9–10.3)
Chloride: 97 mmol/L — ABNORMAL LOW (ref 98–111)
Creatinine: 0.8 mg/dL (ref 0.61–1.24)
GFR, Estimated: >60 mL/min (ref >60)
Glucose, Bld: 210 mg/dL — ABNORMAL HIGH (ref 70–99)
Potassium: 4.6 mmol/L (ref 3.5–5.1)
Sodium: 137 mmol/L (ref 135–145)
Total Bilirubin: 0.4 mg/dL (ref 0.3–1.2)
Total Protein: 7.6 g/dL (ref 6.5–8.1)

## 2021-07-28 MED ORDER — HEPARIN SOD (PORK) LOCK FLUSH 100 UNIT/ML IV SOLN
500.0000 [IU] | Freq: Once | INTRAVENOUS | Status: AC
  Administered 2021-07-28: 500 [IU] via INTRAVENOUS

## 2021-07-28 MED ORDER — IOHEXOL 300 MG/ML  SOLN
100.0000 mL | Freq: Once | INTRAMUSCULAR | Status: AC | PRN
  Administered 2021-07-28: 100 mL via INTRAVENOUS

## 2021-07-28 NOTE — Patient Instructions (Signed)

## 2021-07-31 ENCOUNTER — Inpatient Hospital Stay (HOSPITAL_BASED_OUTPATIENT_CLINIC_OR_DEPARTMENT_OTHER): Payer: PPO | Admitting: Nurse Practitioner

## 2021-07-31 ENCOUNTER — Other Ambulatory Visit (HOSPITAL_COMMUNITY): Payer: Self-pay

## 2021-07-31 ENCOUNTER — Encounter: Payer: Self-pay | Admitting: Nurse Practitioner

## 2021-07-31 ENCOUNTER — Other Ambulatory Visit: Payer: Self-pay | Admitting: *Deleted

## 2021-07-31 ENCOUNTER — Inpatient Hospital Stay: Payer: PPO

## 2021-07-31 ENCOUNTER — Encounter: Payer: Self-pay | Admitting: *Deleted

## 2021-07-31 VITALS — BP 167/76 | HR 64 | Resp 18

## 2021-07-31 VITALS — BP 140/80 | HR 60 | Temp 98.2°F | Resp 18 | Ht 71.0 in | Wt 240.0 lb

## 2021-07-31 DIAGNOSIS — C7931 Secondary malignant neoplasm of brain: Secondary | ICD-10-CM | POA: Diagnosis not present

## 2021-07-31 DIAGNOSIS — C642 Malignant neoplasm of left kidney, except renal pelvis: Secondary | ICD-10-CM | POA: Diagnosis not present

## 2021-07-31 DIAGNOSIS — E538 Deficiency of other specified B group vitamins: Secondary | ICD-10-CM | POA: Diagnosis not present

## 2021-07-31 DIAGNOSIS — K769 Liver disease, unspecified: Secondary | ICD-10-CM | POA: Diagnosis not present

## 2021-07-31 DIAGNOSIS — Z5112 Encounter for antineoplastic immunotherapy: Secondary | ICD-10-CM | POA: Diagnosis not present

## 2021-07-31 DIAGNOSIS — I10 Essential (primary) hypertension: Secondary | ICD-10-CM | POA: Diagnosis not present

## 2021-07-31 DIAGNOSIS — C7972 Secondary malignant neoplasm of left adrenal gland: Secondary | ICD-10-CM | POA: Diagnosis not present

## 2021-07-31 DIAGNOSIS — R21 Rash and other nonspecific skin eruption: Secondary | ICD-10-CM | POA: Diagnosis not present

## 2021-07-31 DIAGNOSIS — C7951 Secondary malignant neoplasm of bone: Secondary | ICD-10-CM | POA: Diagnosis not present

## 2021-07-31 DIAGNOSIS — Z79899 Other long term (current) drug therapy: Secondary | ICD-10-CM | POA: Diagnosis not present

## 2021-07-31 DIAGNOSIS — R4701 Aphasia: Secondary | ICD-10-CM | POA: Diagnosis not present

## 2021-07-31 LAB — CBC WITH DIFFERENTIAL (CANCER CENTER ONLY)
Abs Immature Granulocytes: 0.03 10*3/uL (ref 0.00–0.07)
Basophils Absolute: 0 10*3/uL (ref 0.0–0.1)
Basophils Relative: 0 %
Eosinophils Absolute: 0.4 10*3/uL (ref 0.0–0.5)
Eosinophils Relative: 5 %
HCT: 39.5 % (ref 39.0–52.0)
Hemoglobin: 12 g/dL — ABNORMAL LOW (ref 13.0–17.0)
Immature Granulocytes: 0 %
Lymphocytes Relative: 24 %
Lymphs Abs: 2 10*3/uL (ref 0.7–4.0)
MCH: 25.6 pg — ABNORMAL LOW (ref 26.0–34.0)
MCHC: 30.4 g/dL (ref 30.0–36.0)
MCV: 84.2 fL (ref 80.0–100.0)
Monocytes Absolute: 0.6 10*3/uL (ref 0.1–1.0)
Monocytes Relative: 8 %
Neutro Abs: 5 10*3/uL (ref 1.7–7.7)
Neutrophils Relative %: 63 %
Platelet Count: 261 10*3/uL (ref 150–400)
RBC: 4.69 MIL/uL (ref 4.22–5.81)
RDW: 16 % — ABNORMAL HIGH (ref 11.5–15.5)
WBC Count: 8.1 10*3/uL (ref 4.0–10.5)
nRBC: 0 % (ref 0.0–0.2)

## 2021-07-31 MED ORDER — HEPARIN SOD (PORK) LOCK FLUSH 100 UNIT/ML IV SOLN
500.0000 [IU] | Freq: Once | INTRAVENOUS | Status: AC | PRN
  Administered 2021-07-31: 500 [IU]

## 2021-07-31 MED ORDER — SODIUM CHLORIDE 0.9% FLUSH
10.0000 mL | INTRAVENOUS | Status: DC | PRN
  Administered 2021-07-31: 10 mL

## 2021-07-31 MED ORDER — SODIUM CHLORIDE 0.9 % IV SOLN
200.0000 mg | Freq: Once | INTRAVENOUS | Status: AC
  Administered 2021-07-31: 200 mg via INTRAVENOUS
  Filled 2021-07-31: qty 8

## 2021-07-31 MED ORDER — SODIUM CHLORIDE 0.9 % IV SOLN: Freq: Once | INTRAVENOUS | Status: AC

## 2021-07-31 NOTE — Progress Notes (Signed)
Added CBC for today per MD order.

## 2021-07-31 NOTE — Progress Notes (Signed)
Patient seen by [name] NP today  Vitals are within treatment parameters.  Labs reviewed by [name] NP and are within treatment parameters. OK to use CMP from 07/28/21  Per physician team, patient is ready for treatment and there are NO modifications to the treatment plan.

## 2021-07-31 NOTE — Patient Instructions (Signed)
[facility]   Discharge Instructions: Thank you for choosing [facility] to provide your oncology and hematology care.   If you have a lab appointment with the Cancer Center, please go directly to the Cancer Center and check in at the registration area.   Wear comfortable clothing and clothing appropriate for easy access to any Portacath or PICC line.   We strive to give you quality time with your provider. You may need to reschedule your appointment if you arrive late (15 or more minutes).  Arriving late affects you and other patients whose appointments are after yours.  Also, if you miss three or more appointments without notifying the office, you may be dismissed from the clinic at the provider's discretion.      For prescription refill requests, have your pharmacy contact our office and allow 72 hours for refills to be completed.    Today you received the following chemotherapy and/or immunotherapy agents Pembrolizumab (KEYTRUDA).      To help prevent nausea and vomiting after your treatment, we encourage you to take your nausea medication as directed.  BELOW ARE SYMPTOMS THAT SHOULD BE REPORTED IMMEDIATELY: *FEVER GREATER THAN 100.4 F (38 C) OR HIGHER *CHILLS OR SWEATING *NAUSEA AND VOMITING THAT IS NOT CONTROLLED WITH YOUR NAUSEA MEDICATION *UNUSUAL SHORTNESS OF BREATH *UNUSUAL BRUISING OR BLEEDING *URINARY PROBLEMS (pain or burning when urinating, or frequent urination) *BOWEL PROBLEMS (unusual diarrhea, constipation, pain near the anus) TENDERNESS IN MOUTH AND THROAT WITH OR WITHOUT PRESENCE OF ULCERS (sore throat, sores in mouth, or a toothache) UNUSUAL RASH, SWELLING OR PAIN  UNUSUAL VAGINAL DISCHARGE OR ITCHING   Items with * indicate a potential emergency and should be followed up as soon as possible or go to the Emergency Department if any problems should occur.  Please show the CHEMOTHERAPY ALERT CARD or IMMUNOTHERAPY ALERT CARD  at check-in to the Emergency Department and triage nurse.  Should you have questions after your visit or need to cancel or reschedule your appointment, please contact [facility]  Dept: [phone]  and follow the prompts.  Office hours are 8:00 a.m. to 4:30 p.m. Monday - Friday. Please note that voicemails left after 4:00 p.m. may not be returned until the following business day.  We are closed weekends and major holidays. You have access to a nurse at all times for urgent questions. Please call the main number to the clinic Dept: [phone] and follow the prompts.   For any non-urgent questions, you may also contact your provider using MyChart. We now offer e-Visits for anyone 18 and older to request care online for non-urgent symptoms. For details visit [email or web address].   Also download the MyChart app! Go to the app store, search "MyChart", open the app, select , and log in with your MyChart username and password.  Due to Covid, a mask is required upon entering the hospital/clinic. If you do not have a mask, one will be given to you upon arrival. For doctor visits, patients may have 1 support person aged 18 or older with them. For treatment visits, patients cannot have anyone with them due to current Covid guidelines and our immunocompromised population.   Pembrolizumab injection What is this medication? PEMBROLIZUMAB (pem broe liz ue mab) is a monoclonal antibody. It is used to treat certain types of cancer. This medicine may be used for other purposes; ask your health care provider or pharmacist if you have questions. COMMON BRAND NAME(S):   Keytruda What should I tell my care team before I take this medication? They need to know if you have any of these conditions: autoimmune diseases like Crohn's disease, ulcerative colitis, or lupus have had or planning to have an allogeneic stem cell transplant (uses someone else's stem cells) history of  organ transplant history of chest radiation nervous system problems like myasthenia gravis or Guillain-Barre syndrome an unusual or allergic reaction to pembrolizumab, other medicines, foods, dyes, or preservatives pregnant or trying to get pregnant breast-feeding How should I use this medication? This medicine is for infusion into a vein. It is given by a health care professional in a hospital or clinic setting. A special MedGuide will be given to you before each treatment. Be sure to read this information carefully each time. Talk to your pediatrician regarding the use of this medicine in children. While this drug may be prescribed for children as young as 6 months for selected conditions, precautions do apply. Overdosage: If you think you have taken too much of this medicine contact a poison control center or emergency room at once. NOTE: This medicine is only for you. Do not share this medicine with others. What if I miss a dose? It is important not to miss your dose. Call your doctor or health care professional if you are unable to keep an appointment. What may interact with this medication? Interactions have not been studied. This list may not describe all possible interactions. Give your health care provider a list of all the medicines, herbs, non-prescription drugs, or dietary supplements you use. Also tell them if you smoke, drink alcohol, or use illegal drugs. Some items may interact with your medicine. What should I watch for while using this medication? Your condition will be monitored carefully while you are receiving this medicine. You may need blood work done while you are taking this medicine. Do not become pregnant while taking this medicine or for 4 months after stopping it. Women should inform their doctor if they wish to become pregnant or think they might be pregnant. There is a potential for serious side effects to an unborn child. Talk to your health care professional or  pharmacist for more information. Do not breast-feed an infant while taking this medicine or for 4 months after the last dose. What side effects may I notice from receiving this medication? Side effects that you should report to your doctor or health care professional as soon as possible: allergic reactions like skin rash, itching or hives, swelling of the face, lips, or tongue bloody or black, tarry breathing problems changes in vision chest pain chills confusion constipation cough diarrhea dizziness or feeling faint or lightheaded fast or irregular heartbeat fever flushing joint pain low blood counts - this medicine may decrease the number of white blood cells, red blood cells and platelets. You may be at increased risk for infections and bleeding. muscle pain muscle weakness pain, tingling, numbness in the hands or feet persistent headache redness, blistering, peeling or loosening of the skin, including inside the mouth signs and symptoms of high blood sugar such as dizziness; dry mouth; dry skin; fruity breath; nausea; stomach pain; increased hunger or thirst; increased urination signs and symptoms of kidney injury like trouble passing urine or change in the amount of urine signs and symptoms of liver injury like dark urine, light-colored stools, loss of appetite, nausea, right upper belly pain, yellowing of the eyes or skin sweating swollen lymph nodes weight loss Side effects that usually do not   require medical attention (report to your doctor or health care professional if they continue or are bothersome): decreased appetite hair loss tiredness This list may not describe all possible side effects. Call your doctor for medical advice about side effects. You may report side effects to FDA at 1-800-FDA-1088. Where should I keep my medication? This drug is given in a hospital or clinic and will not be stored at home. NOTE: This sheet is a summary. It may not cover all possible  information. If you have questions about this medicine, talk to your doctor, pharmacist, or health care provider.  2023 Elsevier/Gold Standard (2021-01-13 00:00:00)  

## 2021-07-31 NOTE — Progress Notes (Signed)
[place] OFFICE PROGRESS NOTE   Diagnosis: Renal cell carcinoma  INTERVAL HISTORY:   Mr. Budnick returns as scheduled.  He continues lenvatinib.  He complete a cycle of Pembrolizumab 07/10/2021.  He denies nausea/vomiting.  No mouth sores.  No significant diarrhea.  Leg edema continues to be improved.  He notes a rash at the upper chest, back and posterior neck.  Objective:  Vital signs in last 24 hours:  Blood pressure 140/80, pulse 60, temperature 98.2 F (36.8 C), temperature source Oral, resp. rate 18, height '5\' 11"'$  (1.803 m), weight 240 lb (108.9 kg), SpO2 98 %.    HEENT: No thrush or ulcers. Resp: Lungs clear bilaterally. Cardio: Regular rate and rhythm. GI: No hepatosplenomegaly. Vascular: Pitting edema lower leg bilaterally. Neuro: Alert and oriented. Skin: Acne type rash upper chest/back and posterior neck.   Lab Results:  Lab Results  Component Value Date   WBC 8.1 07/31/2021   HGB 12.0 (L) 07/31/2021   HCT 39.5 07/31/2021   MCV 84.2 07/31/2021   PLT 261 07/31/2021   NEUTROABS 5.0 07/31/2021    Imaging:  No results found.  Medications: I have reviewed the patient's current medications.  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, L4 nerve compression Biopsy of the L4 mass 11/18/2015 confirmed metastatic renal cell carcinoma, clear cell type CTs of the chest, abdomen, and pelvis 11/18/2015-right lower lobe nodule, expansile lytic lesion at the right 11th rib/costal vertebral junction, left renal mass, expansile lesion involving the L4 vertebra, lytic lesion at the left acetabulum, and a low-attenuation liver lesion Initiation of SRS to L4 12/02/2015, Completed 12/12/2015 Initiation of Pazopanib 12/30/2015 Pazopanib placed on hold 02/06/2016 secondary to elevated liver enzymes Pazopanib resumed 03/07/2016 at a dose of 400 mg daily  Pazopanib discontinued 03/19/2016 secondary to elevated liver  enzymes Restaging CTs 04/02/2016-stable left renal mass, decreased soft tissue component associated with the L4 metastasis, increased soft tissue component associated with the right 11th rib metastasis with increased T11 bony destruction, increased sclerosis at the left acetabulum lesion Cycle 1 nivolumab 04/12/2016 Cycle 2 nivolumab 04/26/2016 Cycle 3 nivolumab 05/11/2016 Cycle 4 nivolumab 05/24/2016 Cycle 5 nivolumab 06/08/2016 MRI lumbar spine 06/21/2016-unchanged tumor at L3, increased size of retroperitoneal lymph nodes compared to a CT from 04/02/2016 Cycle 6 nivolumab  06/22/2016 CTs chest, abdomen, and pelvis 07/04/2016-enlargement of the left renal mass, right adrenal nodule, left hilar and peritoneal lymph nodes, enlargement of left acetabular lesion. Stable lung nodules. Cycle 7 nivolumab 07/06/2016 Cycle 8 nivolumab 07/20/2016 Cycle 9 nivolumab 08/02/2016 Cycle 10 nivolumab 08/20/2016 Restaging CT 09/03/2016 evaluation with stable disease Cycle 11 nivolumab 09/05/2016 Cycle 12 nivolumab 09/19/2016 Cycle 13 nivolumab 10/03/2016 Cycle 14 nivolumab 10/17/2016 Cycle 15 nivolumab 10/31/2016 Cycle 16 nivolumab 11/14/2016 (changed to monthly schedule) Cycle 17 nivolumab 12/19/2016 CTs 01/21/2017-increased left renal mass, increased size of adrenal metastases, increased lytic bone lesions, increased left lung nodule, persistent tumor at L4 with probable epidural component Initiation of Cabozantinib 01/28/2017 Restaging CTs 05/30/2017- decreased size of left hilar mass, left renal mass, retroperitoneal adenopathy, and adrenal metastasis.  Healing bone lesions. Cabozantinib continued CTs 10/21/2017- interval enlargement left hilar lymph node; stable rib lesions; stable mass left renal cortex; stable mildly nodular adrenal glands; stable lytic lesions within the pelvis and spine. Cabozantinib continued CTs 02/21/2018- enlargement of an AP window lymph node.  Mildly enlarged left hilar lymph  node is unchanged.  Primary renal cell carcinoma involving the upper pole of the left kidney appears similar.  Stable  enlarged right periaortic lymph node adjacent to the renal vessels.  Stable multifocal bony metastatic disease. Cabozantinib continued CTs 07/29/2018- stable 15 mm AP window nodes; stable left hilar node; subcarinal node slightly larger; right periaortic node 16 mm, previously 12 mm; portacaval node 24 mm, previously 16 mm; 15 mm node superior to the pancreatic head has enlarged; interval increase nodularity of both adrenal glands; stable left kidney mass; multifocal bony metastatic disease not significantly changed. Cabozantinib continued CTs 11/06/2018- moderate improvement in thoracic adenopathy; minimal improvement abdominal adenopathy; left kidney upper pole mass and various lytic expansile bone lesions stable; mild increase in nodularity of left adrenal gland; right adrenal gland nodularity stable. 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  Peripheral enhancing masses consistent with metastases. No hemorrhage. MRI brain 03/11/2019-7 enhancing brain masses consistent with metastatic disease SRS to 7 brain lesions, treatment given 03/19/2019, 03/23/2019, and 03/25/2019 CTs 04/09/2019-decrease in left renal mass, bilateral adrenal nodules, AP window and porta hepatic adenopathy.  New 9 mm right lower lobe nodule.  Improved lytic lesion of the left acetabulum.  Other bone lesions are stable. Cabozantinib continued MRI brain 07/17/2019-resolution of 4 mm treated lesion in the right frontal cortex, 6 remaining treated lesions have decreased in size, new punctate metastasis in the superior right cerebellum SRS to right cerebellar lesion 07/30/2019 CTs 08/17/2019-previously noted right lower lobe nodule resolved, stable left kidney mass, mixed lytic/sclerotic bone lesions in the  thoracolumbar spine, right posterior ribs, and left acetabulum-unchanged, no evidence of progressive disease  Cabozantinib continued MRI brain 10/23/2019-stable to slight decrease in size of multiple enhancing intracranial lesions.  Slight increase in surrounding edema in the medial left frontal lobe.  No new lesions present. MRI brain 01/29/2020-multiple enhancing brain lesions, some hemorrhagic, some with mild enlargement-treatment effect? CTs 02/08/2020-no thoracic metastases, enlargement of the left renal mass, enlargement of lytic lesion at T9, other lytic lesions unchanged MRI brain 05/04/2020-no new lesions, slight enlargement of a right occipital lesion, other lesions are stable or decreased in size CTs 08/11/2020-enlargement of left kidney mass, new 1.6 cm segment 7 liver lesion, enlargement of a lytic lesion at T9, increased lysis of a sclerotic lesion at the right second rib, 0.7 cm endoluminal nodule at the bladder dome-enlarged Brain MRI 08/10/2020-slight increase in a 15 mm left frontal lobe lesion, new punctate focus in the right occipital cortex, other lesions stable or slightly decreased CTs 11/01/2020-increase in left suprahilar opacity, enlargement of previous liver metastases, several new subcentimeter lesions, increase in left renal mass, similar appearance of bone metastases, stable hyperdense/enhancing nodule in the left bladder Brain MRI 11/04/2020-no change in brain metastases Lenvatinib/pembrolizumab 11/30/2020 CTs 03/03/2021-stable left hilar lymph nodes; resolution of left upper lobe perihilar nodularity; unchanged for millimeter peripheral right upper lobe nodule; multiple lytic bone lesions appear stable; left kidney lesion decreased in size; bilateral adrenal nodules decreased in size; several liver lesion showed mild increase in size; several new small lesions within the right hepatic lobe. Lenvatinib/pembrolizumab continued 03/07/2021 Brain MRI 03/10/2021-progression of a left frontal  metastasis, tiny new metastasis in the right frontal lobe-referred for SBRT to the right frontal lesion and to [facility] to consider a LITT procedure for the progressive left frontal lesion SRS 04/06/2021, LITT procedure at [facility] 04/18/2021-pathology metastatic renal cell carcinoma in background of necrosis/gliosis Lenvatinib on hold Pembrolizumab held 04/24/2021 Pembrolizumab 05/01/2021, lenvatinib remains on hold pending surgical evaluation Pembrolizumab 05/25/2021, lenvatinib resumed Pembrolizumab 06/15/2021 Lenvima resumed 06/21/2021 CTs  07/28/2021-continued regression left renal lesion.  Much improved appearance of the liver.  Continued regression of bilateral adrenal gland nodules.  Stable mixed lytic and sclerotic metastatic bone disease.  No new or progressive findings. Pembrolizumab 07/31/2021, lenvatinib continued Pain secondary to #1-managed by Dr. Lovenia Shuck.  Improved Hypertension Elevated transaminases 02/06/2016- Pazopanib placed on hold Liver enzymes normal 03/07/2016 Port-A-Cath placement 05/16/2016 Malaise/anorexia 09/05/2016. Cortisol and testosterone levels low. Hydrocortisone and testosterone replacement initiated. Conjunctival/scleral erythema 09/19/2016-resolved with steroid eyedrops Proximal right leg weakness. 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 Hypercalcemia status post Zometa 01/23/2017-resolved Pruritic rash following IV contrast 10/21/2017; rash following IV contrast 02/21/2018 despite prednisone/Benadryl premedication Brief episodes of expressive aphasia October and December 2020 Vitamin B12 deficiency confirmed on lab 03/07/2021-started oral vitamin B12 replacement 03/28/2021    Disposition: Mr. Bourbon appears stable.  He is on active treatment with pembrolizumab and lenvatinib.  He seems to be tolerating treatment well.  Recent restaging CT scans show improvement.  Results/images reviewed at today's visit.  Recommend continue current  treatment.  He agrees with this plan.  He will receive Pembrolizumab today.  He will return for lab, follow-up, Pembrolizumab in 3 weeks.  We are available to see him sooner if needed.  Patient seen with Dr. [name].    [name] ANP/GNP-BC   07/31/2021  2:39 PM This was a shared visit with [name].  Mr. Dosher was interviewed and examined.  He appears stable.  We discussed the the restaging CT findings with him.  We reviewed the images with his significant other.  The CTs are consistent with a response to the lenvatinib and pembrolizumab.  The plan is to continue the current treatment.  The restaging brain MRI also revealed stable to improved disease.  I was present greater than 50% of today's visit  I performed medical decision making.  Julieanne Manson, MD

## 2021-08-02 ENCOUNTER — Other Ambulatory Visit: Payer: Self-pay | Admitting: Oncology

## 2021-08-02 ENCOUNTER — Other Ambulatory Visit: Payer: Self-pay | Admitting: Radiation Therapy

## 2021-08-02 ENCOUNTER — Other Ambulatory Visit (HOSPITAL_COMMUNITY): Payer: Self-pay

## 2021-08-02 MED ORDER — LENVIMA (18 MG DAILY DOSE) 10 MG & 2 X 4 MG PO CPPK
18.0000 mg | ORAL_CAPSULE | Freq: Every day | ORAL | 0 refills | Status: DC
  Filled 2021-08-02: qty 90, 30d supply, fill #0

## 2021-08-03 ENCOUNTER — Other Ambulatory Visit (HOSPITAL_COMMUNITY): Payer: Self-pay

## 2021-08-08 ENCOUNTER — Other Ambulatory Visit: Payer: Self-pay | Admitting: Nurse Practitioner

## 2021-08-08 ENCOUNTER — Other Ambulatory Visit (HOSPITAL_COMMUNITY): Payer: Self-pay

## 2021-08-08 DIAGNOSIS — C649 Malignant neoplasm of unspecified kidney, except renal pelvis: Secondary | ICD-10-CM

## 2021-08-08 MED ORDER — OXYCODONE HCL ER 10 MG PO T12A
EXTENDED_RELEASE_TABLET | Freq: Two times a day (BID) | ORAL | 0 refills | Status: DC
  Filled 2021-08-08: qty 120, 30d supply, fill #0

## 2021-08-08 MED ORDER — OXYCODONE HCL 5 MG PO TABS
5.0000 mg | ORAL_TABLET | Freq: Four times a day (QID) | ORAL | 0 refills | Status: DC | PRN
  Filled 2021-08-08: qty 60, 15d supply, fill #0

## 2021-08-08 MED ORDER — ONDANSETRON HCL 8 MG PO TABS
ORAL_TABLET | ORAL | 2 refills | Status: DC
  Filled 2021-08-08: qty 90, 30d supply, fill #0
  Filled 2021-09-04: qty 90, 30d supply, fill #1
  Filled 2021-10-03: qty 90, 30d supply, fill #2

## 2021-08-09 ENCOUNTER — Other Ambulatory Visit (HOSPITAL_COMMUNITY): Payer: Self-pay

## 2021-08-20 ENCOUNTER — Other Ambulatory Visit: Payer: Self-pay | Admitting: Oncology

## 2021-08-21 ENCOUNTER — Inpatient Hospital Stay (HOSPITAL_BASED_OUTPATIENT_CLINIC_OR_DEPARTMENT_OTHER): Payer: PPO | Admitting: Nurse Practitioner

## 2021-08-21 ENCOUNTER — Inpatient Hospital Stay: Payer: PPO

## 2021-08-21 ENCOUNTER — Encounter: Payer: Self-pay | Admitting: *Deleted

## 2021-08-21 ENCOUNTER — Encounter: Payer: Self-pay | Admitting: Nurse Practitioner

## 2021-08-21 ENCOUNTER — Other Ambulatory Visit (HOSPITAL_BASED_OUTPATIENT_CLINIC_OR_DEPARTMENT_OTHER): Payer: Self-pay

## 2021-08-21 VITALS — BP 164/69 | HR 50 | Temp 98.1°F | Resp 18 | Ht 71.0 in | Wt 241.1 lb

## 2021-08-21 DIAGNOSIS — C649 Malignant neoplasm of unspecified kidney, except renal pelvis: Secondary | ICD-10-CM | POA: Diagnosis not present

## 2021-08-21 DIAGNOSIS — Z95828 Presence of other vascular implants and grafts: Secondary | ICD-10-CM

## 2021-08-21 DIAGNOSIS — C642 Malignant neoplasm of left kidney, except renal pelvis: Secondary | ICD-10-CM

## 2021-08-21 DIAGNOSIS — Z5112 Encounter for antineoplastic immunotherapy: Secondary | ICD-10-CM | POA: Diagnosis not present

## 2021-08-21 LAB — CMP (CANCER CENTER ONLY)
ALT: 10 U/L (ref 0–44)
AST: 10 U/L — ABNORMAL LOW (ref 15–41)
Albumin: 4 g/dL (ref 3.5–5.0)
Alkaline Phosphatase: 55 U/L (ref 38–126)
Anion gap: 9 (ref 5–15)
BUN: 17 mg/dL (ref 8–23)
CO2: 29 mmol/L (ref 22–32)
Calcium: 9.7 mg/dL (ref 8.9–10.3)
Chloride: 101 mmol/L (ref 98–111)
Creatinine: 0.86 mg/dL (ref 0.61–1.24)
GFR, Estimated: >60 mL/min (ref >60)
Glucose, Bld: 169 mg/dL — ABNORMAL HIGH (ref 70–99)
Potassium: 3.8 mmol/L (ref 3.5–5.1)
Sodium: 139 mmol/L (ref 135–145)
Total Bilirubin: 0.3 mg/dL (ref 0.3–1.2)
Total Protein: 6.7 g/dL (ref 6.5–8.1)

## 2021-08-21 LAB — CBC WITH DIFFERENTIAL (CANCER CENTER ONLY)
Abs Immature Granulocytes: 0.02 10*3/uL (ref 0.00–0.07)
Basophils Absolute: 0.1 10*3/uL (ref 0.0–0.1)
Basophils Relative: 1 %
Eosinophils Absolute: 0.7 10*3/uL — ABNORMAL HIGH (ref 0.0–0.5)
Eosinophils Relative: 7 %
HCT: 40.5 % (ref 39.0–52.0)
Hemoglobin: 12.4 g/dL — ABNORMAL LOW (ref 13.0–17.0)
Immature Granulocytes: 0 %
Lymphocytes Relative: 21 %
Lymphs Abs: 1.8 10*3/uL (ref 0.7–4.0)
MCH: 25.5 pg — ABNORMAL LOW (ref 26.0–34.0)
MCHC: 30.6 g/dL (ref 30.0–36.0)
MCV: 83.2 fL (ref 80.0–100.0)
Monocytes Absolute: 0.8 10*3/uL (ref 0.1–1.0)
Monocytes Relative: 9 %
Neutro Abs: 5.4 10*3/uL (ref 1.7–7.7)
Neutrophils Relative %: 62 %
Platelet Count: 251 10*3/uL (ref 150–400)
RBC: 4.87 MIL/uL (ref 4.22–5.81)
RDW: 16.2 % — ABNORMAL HIGH (ref 11.5–15.5)
WBC Count: 8.7 10*3/uL (ref 4.0–10.5)
nRBC: 0 % (ref 0.0–0.2)

## 2021-08-21 LAB — CORTISOL: Cortisol, Plasma: 19.5 ug/dL

## 2021-08-21 LAB — TSH: TSH: 1.279 u[IU]/mL (ref 0.350–4.500)

## 2021-08-21 LAB — TOTAL PROTEIN, URINE DIPSTICK: Protein, ur: 100 mg/dL — AB

## 2021-08-21 MED ORDER — HEPARIN SOD (PORK) LOCK FLUSH 100 UNIT/ML IV SOLN
500.0000 [IU] | Freq: Once | INTRAVENOUS | Status: AC
  Administered 2021-08-21: 500 [IU] via INTRAVENOUS

## 2021-08-21 MED ORDER — SODIUM CHLORIDE 0.9% FLUSH
10.0000 mL | INTRAVENOUS | Status: AC | PRN
  Administered 2021-08-21: 10 mL via INTRAVENOUS

## 2021-08-21 NOTE — Progress Notes (Signed)
[facility] OFFICE PROGRESS NOTE   Diagnosis: Renal cell carcinoma  INTERVAL HISTORY:   Phillip [name] returns as scheduled.  He continues lenvatinib.  He completed another cycle of Pembrolizumab 07/31/2021.  His significant other reports that he is "not doing well".  He is becoming weaker.  He is now walking with a walker.  She reports similar symptoms when he was on nivolumab.  He states that he "aches all over".  No joint pain.  No nausea or vomiting.  Appetite is good.  He continues to have an acne-like skin rash.  Objective:  Vital signs in last 24 hours:  Blood pressure (!) 164/69, pulse (!) 50, temperature 98.1 F (36.7 C), temperature source Oral, resp. rate 18, height 5\' 11"  (1.803 m), weight 241 lb 1.6 oz (109.4 kg), SpO2 98 %.    HEENT: No thrush or ulcers. Resp: Lungs clear bilaterally. Cardio: Regular rate and rhythm. GI: Abdomen soft and nontender.  No hepatomegaly. Vascular: Trace edema lower legs. Neuro: Alert and oriented.  Seems slightly slow to respond to questions.  Moves all extremities.  Strength appears intact. Skin: Acne like skin rash neck/trunk. Port-A-Cath without erythema  Lab Results:  Lab Results  Component Value Date   WBC 8.7 08/21/2021   HGB 12.4 (L) 08/21/2021   HCT 40.5 08/21/2021   MCV 83.2 08/21/2021   PLT 251 08/21/2021   NEUTROABS 5.4 08/21/2021    Imaging:  No results found.  Medications: I have reviewed the patient's current medications.  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, L4 nerve compression Biopsy of the L4 mass 11/18/2015 confirmed metastatic renal cell carcinoma, clear cell type CTs of the chest, abdomen, and pelvis 11/18/2015-right lower lobe nodule, expansile lytic lesion at the right 11th rib/costal vertebral junction, left renal mass, expansile lesion involving the L4 vertebra, lytic lesion at the left acetabulum, and a low-attenuation liver  lesion Initiation of SRS to L4 12/02/2015, Completed 12/12/2015 Initiation of Pazopanib 12/30/2015 Pazopanib placed on hold 02/06/2016 secondary to elevated liver enzymes Pazopanib resumed 03/07/2016 at a dose of 400 mg daily  Pazopanib discontinued 03/19/2016 secondary to elevated liver enzymes Restaging CTs 04/02/2016-stable left renal mass, decreased soft tissue component associated with the L4 metastasis, increased soft tissue component associated with the right 11th rib metastasis with increased T11 bony destruction, increased sclerosis at the left acetabulum lesion Cycle 1 nivolumab 04/12/2016 Cycle 2 nivolumab 04/26/2016 Cycle 3 nivolumab 05/11/2016 Cycle 4 nivolumab 05/24/2016 Cycle 5 nivolumab 06/08/2016 MRI lumbar spine 06/21/2016-unchanged tumor at L3, increased size of retroperitoneal lymph nodes compared to a CT from 04/02/2016 Cycle 6 nivolumab  06/22/2016 CTs chest, abdomen, and pelvis 07/04/2016-enlargement of the left renal mass, right adrenal nodule, left hilar and peritoneal lymph nodes, enlargement of left acetabular lesion. Stable lung nodules. Cycle 7 nivolumab 07/06/2016 Cycle 8 nivolumab 07/20/2016 Cycle 9 nivolumab 08/02/2016 Cycle 10 nivolumab 08/20/2016 Restaging CT 09/03/2016 evaluation with stable disease Cycle 11 nivolumab 09/05/2016 Cycle 12 nivolumab 09/19/2016 Cycle 13 nivolumab 10/03/2016 Cycle 14 nivolumab 10/17/2016 Cycle 15 nivolumab 10/31/2016 Cycle 16 nivolumab 11/14/2016 (changed to monthly schedule) Cycle 17 nivolumab 12/19/2016 CTs 01/21/2017-increased left renal mass, increased size of adrenal metastases, increased lytic bone lesions, increased left lung nodule, persistent tumor at L4 with probable epidural component Initiation of Cabozantinib 01/28/2017 Restaging CTs 05/30/2017- decreased size of left hilar mass, left renal mass, retroperitoneal adenopathy, and adrenal metastasis.  Healing bone lesions. Cabozantinib continued CTs 10/21/2017-  interval enlargement left hilar lymph node; stable rib lesions;  stable mass left renal cortex; stable mildly nodular adrenal glands; stable lytic lesions within the pelvis and spine. Cabozantinib continued CTs 02/21/2018- enlargement of an AP window lymph node.  Mildly enlarged left hilar lymph node is unchanged.  Primary renal cell carcinoma involving the upper pole of the left kidney appears similar.  Stable enlarged right periaortic lymph node adjacent to the renal vessels.  Stable multifocal bony metastatic disease. Cabozantinib continued CTs 07/29/2018- stable 15 mm AP window nodes; stable left hilar node; subcarinal node slightly larger; right periaortic node 16 mm, previously 12 mm; portacaval node 24 mm, previously 16 mm; 15 mm node superior to the pancreatic head has enlarged; interval increase nodularity of both adrenal glands; stable left kidney mass; multifocal bony metastatic disease not significantly changed. Cabozantinib continued CTs 11/06/2018- moderate improvement in thoracic adenopathy; minimal improvement abdominal adenopathy; left kidney upper pole mass and various lytic expansile bone lesions stable; mild increase in nodularity of left adrenal gland; right adrenal gland nodularity stable. 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  Peripheral enhancing masses consistent with metastases. No hemorrhage. MRI brain 03/11/2019-7 enhancing brain masses consistent with metastatic disease SRS to 7 brain lesions, treatment given 03/19/2019, 03/23/2019, and 03/25/2019 CTs 04/09/2019-decrease in left renal mass, bilateral adrenal nodules, AP window and porta hepatic adenopathy.  New 9 mm right lower lobe nodule.  Improved lytic lesion of the left acetabulum.  Other bone lesions are stable. Cabozantinib continued MRI brain 07/17/2019-resolution of 4 mm treated lesion in the right frontal  cortex, 6 remaining treated lesions have decreased in size, new punctate metastasis in the superior right cerebellum SRS to right cerebellar lesion 07/30/2019 CTs 08/17/2019-previously noted right lower lobe nodule resolved, stable left kidney mass, mixed lytic/sclerotic bone lesions in the thoracolumbar spine, right posterior ribs, and left acetabulum-unchanged, no evidence of progressive disease  Cabozantinib continued MRI brain 10/23/2019-stable to slight decrease in size of multiple enhancing intracranial lesions.  Slight increase in surrounding edema in the medial left frontal lobe.  No new lesions present. MRI brain 01/29/2020-multiple enhancing brain lesions, some hemorrhagic, some with mild enlargement-treatment effect? CTs 02/08/2020-no thoracic metastases, enlargement of the left renal mass, enlargement of lytic lesion at T9, other lytic lesions unchanged MRI brain 05/04/2020-no new lesions, slight enlargement of a right occipital lesion, other lesions are stable or decreased in size CTs 08/11/2020-enlargement of left kidney mass, new 1.6 cm segment 7 liver lesion, enlargement of a lytic lesion at T9, increased lysis of a sclerotic lesion at the right second rib, 0.7 cm endoluminal nodule at the bladder dome-enlarged Brain MRI 08/10/2020-slight increase in a 15 mm left frontal lobe lesion, new punctate focus in the right occipital cortex, other lesions stable or slightly decreased CTs 11/01/2020-increase in left suprahilar opacity, enlargement of previous liver metastases, several new subcentimeter lesions, increase in left renal mass, similar appearance of bone metastases, stable hyperdense/enhancing nodule in the left bladder Brain MRI 11/04/2020-no change in brain metastases Lenvatinib/pembrolizumab 11/30/2020 CTs 03/03/2021-stable left hilar lymph nodes; resolution of left upper lobe perihilar nodularity; unchanged for millimeter peripheral right upper lobe nodule; multiple lytic bone lesions appear  stable; left kidney lesion decreased in size; bilateral adrenal nodules decreased in size; several liver lesion showed mild increase in size; several new small lesions within the right hepatic lobe. Lenvatinib/pembrolizumab continued 03/07/2021 Brain MRI 03/10/2021-progression of a left frontal metastasis, tiny new metastasis in the right frontal lobe-referred for SBRT to the right frontal lesion  and to [facility] to consider a LITT procedure for the progressive left frontal lesion SRS 04/06/2021, LITT procedure at [facility] 04/18/2021-pathology metastatic renal cell carcinoma in background of necrosis/gliosis Lenvatinib on hold Pembrolizumab held 04/24/2021 Pembrolizumab 05/01/2021, lenvatinib remains on hold pending surgical evaluation Pembrolizumab 05/25/2021, lenvatinib resumed Pembrolizumab 06/15/2021 Lenvima resumed 06/21/2021 CTs 07/28/2021-continued regression left renal lesion.  Much improved appearance of the liver.  Continued regression of bilateral adrenal gland nodules.  Stable mixed lytic and sclerotic metastatic bone disease.  No new or progressive findings. Pembrolizumab 07/31/2021, lenvatinib continued Pembrolizumab held 08/21/2021 due to generalized weakness Pain secondary to #1-managed by Dr. Norville Haggard.  Improved Hypertension Elevated transaminases 02/06/2016- Pazopanib placed on hold Liver enzymes normal 03/07/2016 Port-A-Cath placement 05/16/2016 Malaise/anorexia 09/05/2016. Cortisol and testosterone levels low. Hydrocortisone and testosterone replacement initiated. Conjunctival/scleral erythema 09/19/2016-resolved with steroid eyedrops Proximal right leg weakness. 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 Hypercalcemia status post Zometa 01/23/2017-resolved Pruritic rash following IV contrast 10/21/2017; rash following IV contrast 02/21/2018 despite prednisone/Benadryl premedication Brief episodes of expressive aphasia October and December 2020 Vitamin B12  deficiency confirmed on lab 03/07/2021-started oral vitamin B12 replacement 03/28/2021    Disposition: Mr. Criner appears unchanged.  He is on active treatment with lenvatinib and Pembrolizumab.  Recent CT scans consistent with a response to treatment.  He presents today for routine follow-up.  Complains of generalized weakness.  He had similar symptoms when on nivolumab.  We decided to hold today's treatment, check testosterone and cortisol levels.  He will return for follow-up in 3 weeks.  We are available to see him sooner if needed.  Plan reviewed with Dr. Truett Perna.    Phillip [name] ANP/GNP-BC   08/21/2021  12:42 PM

## 2021-08-22 LAB — TESTOSTERONE: Testosterone: 349 ng/dL (ref 264–916)

## 2021-08-23 ENCOUNTER — Telehealth: Payer: Self-pay

## 2021-08-23 NOTE — Telephone Encounter (Signed)
-----   Message from Owens Shark, NP sent at 08/23/2021  7:35 AM EDT ----- Please let his wife know testosterone and cortisol levels are normal.

## 2021-08-23 NOTE — Telephone Encounter (Signed)
Called and spoke to patient's wife Phillip [name], who is on patient's ROI form and informed her of information below.  Patient's wife verbalized understanding and agreed to let patient know.  Reminded patient's wife about patient's upcoming appointments scheduled for July and informed wife to contact office if patient had any questions or concerns prior to his next appointments.  Wife verbalized understanding.

## 2021-09-04 ENCOUNTER — Other Ambulatory Visit: Payer: Self-pay | Admitting: Nurse Practitioner

## 2021-09-04 ENCOUNTER — Other Ambulatory Visit (HOSPITAL_COMMUNITY): Payer: Self-pay

## 2021-09-04 ENCOUNTER — Other Ambulatory Visit: Payer: Self-pay | Admitting: Oncology

## 2021-09-04 ENCOUNTER — Other Ambulatory Visit: Payer: Self-pay

## 2021-09-04 DIAGNOSIS — C649 Malignant neoplasm of unspecified kidney, except renal pelvis: Secondary | ICD-10-CM

## 2021-09-04 MED ORDER — LENVIMA (18 MG DAILY DOSE) 10 MG & 2 X 4 MG PO CPPK
18.0000 mg | ORAL_CAPSULE | Freq: Every day | ORAL | 0 refills | Status: DC
  Filled 2021-09-04: qty 90, 30d supply, fill #0

## 2021-09-04 MED ORDER — HYDROCORTISONE 10 MG PO TABS
ORAL_TABLET | Freq: Every day | ORAL | 3 refills | Status: DC
  Filled 2021-09-04: qty 90, 30d supply, fill #0
  Filled 2021-10-03: qty 90, 30d supply, fill #1
  Filled 2021-10-31: qty 90, 30d supply, fill #2
  Filled 2021-11-28: qty 90, 30d supply, fill #3

## 2021-09-05 ENCOUNTER — Other Ambulatory Visit (HOSPITAL_COMMUNITY): Payer: Self-pay

## 2021-09-06 ENCOUNTER — Other Ambulatory Visit (HOSPITAL_COMMUNITY): Payer: Self-pay

## 2021-09-06 ENCOUNTER — Other Ambulatory Visit: Payer: Self-pay | Admitting: Nurse Practitioner

## 2021-09-06 DIAGNOSIS — C649 Malignant neoplasm of unspecified kidney, except renal pelvis: Secondary | ICD-10-CM

## 2021-09-06 MED ORDER — OXYCODONE HCL ER 10 MG PO T12A
EXTENDED_RELEASE_TABLET | Freq: Two times a day (BID) | ORAL | 0 refills | Status: DC
  Filled 2021-09-06: qty 120, 30d supply, fill #0

## 2021-09-06 MED ORDER — OXYCODONE HCL 5 MG PO TABS
5.0000 mg | ORAL_TABLET | Freq: Four times a day (QID) | ORAL | 0 refills | Status: DC | PRN
  Filled 2021-09-06: qty 60, 15d supply, fill #0

## 2021-09-06 MED ORDER — LATANOPROST 0.005 % OP SOLN
OPHTHALMIC | 12 refills | Status: DC
  Filled 2021-09-06: qty 7.5, 75d supply, fill #0

## 2021-09-11 ENCOUNTER — Inpatient Hospital Stay: Payer: PPO

## 2021-09-11 ENCOUNTER — Other Ambulatory Visit: Payer: Self-pay

## 2021-09-11 ENCOUNTER — Inpatient Hospital Stay: Payer: PPO | Admitting: Oncology

## 2021-09-11 ENCOUNTER — Inpatient Hospital Stay: Payer: PPO | Attending: Nurse Practitioner

## 2021-09-11 ENCOUNTER — Encounter: Payer: Self-pay | Admitting: *Deleted

## 2021-09-11 ENCOUNTER — Inpatient Hospital Stay (HOSPITAL_BASED_OUTPATIENT_CLINIC_OR_DEPARTMENT_OTHER): Payer: PPO | Admitting: Oncology

## 2021-09-11 VITALS — BP 154/69 | HR 60 | Temp 98.1°F | Resp 18 | Ht 71.0 in | Wt 247.0 lb

## 2021-09-11 VITALS — BP 131/70 | HR 58 | Resp 18

## 2021-09-11 DIAGNOSIS — C642 Malignant neoplasm of left kidney, except renal pelvis: Secondary | ICD-10-CM | POA: Insufficient documentation

## 2021-09-11 DIAGNOSIS — Z5112 Encounter for antineoplastic immunotherapy: Secondary | ICD-10-CM | POA: Diagnosis not present

## 2021-09-11 DIAGNOSIS — C7972 Secondary malignant neoplasm of left adrenal gland: Secondary | ICD-10-CM | POA: Diagnosis not present

## 2021-09-11 DIAGNOSIS — E538 Deficiency of other specified B group vitamins: Secondary | ICD-10-CM | POA: Diagnosis not present

## 2021-09-11 DIAGNOSIS — I10 Essential (primary) hypertension: Secondary | ICD-10-CM | POA: Diagnosis not present

## 2021-09-11 DIAGNOSIS — Z95828 Presence of other vascular implants and grafts: Secondary | ICD-10-CM

## 2021-09-11 DIAGNOSIS — C7951 Secondary malignant neoplasm of bone: Secondary | ICD-10-CM | POA: Diagnosis not present

## 2021-09-11 DIAGNOSIS — C7931 Secondary malignant neoplasm of brain: Secondary | ICD-10-CM | POA: Diagnosis not present

## 2021-09-11 DIAGNOSIS — C649 Malignant neoplasm of unspecified kidney, except renal pelvis: Secondary | ICD-10-CM | POA: Diagnosis not present

## 2021-09-11 LAB — CMP (CANCER CENTER ONLY)
ALT: 13 U/L (ref 0–44)
AST: 15 U/L (ref 15–41)
Albumin: 4 g/dL (ref 3.5–5.0)
Alkaline Phosphatase: 53 U/L (ref 38–126)
Anion gap: 8 (ref 5–15)
BUN: 16 mg/dL (ref 8–23)
CO2: 30 mmol/L (ref 22–32)
Calcium: 9.4 mg/dL (ref 8.9–10.3)
Chloride: 98 mmol/L (ref 98–111)
Creatinine: 0.85 mg/dL (ref 0.61–1.24)
GFR, Estimated: >60 mL/min (ref >60)
Glucose, Bld: 231 mg/dL — ABNORMAL HIGH (ref 70–99)
Potassium: 3.9 mmol/L (ref 3.5–5.1)
Sodium: 136 mmol/L (ref 135–145)
Total Bilirubin: 0.3 mg/dL (ref 0.3–1.2)
Total Protein: 6.6 g/dL (ref 6.5–8.1)

## 2021-09-11 LAB — CBC WITH DIFFERENTIAL (CANCER CENTER ONLY)
Abs Immature Granulocytes: 0.03 10*3/uL (ref 0.00–0.07)
Basophils Absolute: 0.1 10*3/uL (ref 0.0–0.1)
Basophils Relative: 1 %
Eosinophils Absolute: 0.7 10*3/uL — ABNORMAL HIGH (ref 0.0–0.5)
Eosinophils Relative: 7 %
HCT: 39.4 % (ref 39.0–52.0)
Hemoglobin: 12.3 g/dL — ABNORMAL LOW (ref 13.0–17.0)
Immature Granulocytes: 0 %
Lymphocytes Relative: 17 %
Lymphs Abs: 1.6 10*3/uL (ref 0.7–4.0)
MCH: 26 pg (ref 26.0–34.0)
MCHC: 31.2 g/dL (ref 30.0–36.0)
MCV: 83.3 fL (ref 80.0–100.0)
Monocytes Absolute: 0.8 10*3/uL (ref 0.1–1.0)
Monocytes Relative: 8 %
Neutro Abs: 6.4 10*3/uL (ref 1.7–7.7)
Neutrophils Relative %: 67 %
Platelet Count: 259 10*3/uL (ref 150–400)
RBC: 4.73 MIL/uL (ref 4.22–5.81)
RDW: 16.3 % — ABNORMAL HIGH (ref 11.5–15.5)
WBC Count: 9.5 10*3/uL (ref 4.0–10.5)
nRBC: 0 % (ref 0.0–0.2)

## 2021-09-11 MED ORDER — SODIUM CHLORIDE 0.9% FLUSH
10.0000 mL | INTRAVENOUS | Status: DC | PRN
  Administered 2021-09-11: 10 mL via INTRAVENOUS

## 2021-09-11 MED ORDER — HEPARIN SOD (PORK) LOCK FLUSH 100 UNIT/ML IV SOLN
500.0000 [IU] | Freq: Once | INTRAVENOUS | Status: AC | PRN
  Administered 2021-09-11: 500 [IU]

## 2021-09-11 MED ORDER — SODIUM CHLORIDE 0.9% FLUSH
10.0000 mL | INTRAVENOUS | Status: DC | PRN
  Administered 2021-09-11: 10 mL

## 2021-09-11 MED ORDER — SODIUM CHLORIDE 0.9 % IV SOLN: Freq: Once | INTRAVENOUS | Status: AC

## 2021-09-11 MED ORDER — SODIUM CHLORIDE 0.9 % IV SOLN
200.0000 mg | Freq: Once | INTRAVENOUS | Status: AC
  Administered 2021-09-11: 200 mg via INTRAVENOUS
  Filled 2021-09-11: qty 8

## 2021-09-11 NOTE — Progress Notes (Signed)
[place] OFFICE PROGRESS NOTE   Diagnosis: Renal cell carcinoma  INTERVAL HISTORY:   Phillip [name] returns as scheduled.  He reports stable back and leg pain.  He is ambulating with a walker.  He continues to have difficulty concentrating and he has somnolence.  No diarrhea.  Stable rash over the trunk.  He continues lenvatinib.   Objective:  Vital signs in last 24 hours:  Blood pressure (!) 154/69, pulse 60, temperature 98.1 F (36.7 C), temperature source Oral, resp. rate 18, height '5\' 11"'$  (1.803 m), weight 247 lb (112 kg), SpO2 98 %.    HEENT: No thrush or ulcers Resp: Lungs clear bilaterally Cardio: Irregular GI: No hepatosplenomegaly Vascular: No leg edema Neuro: Alert and oriented, ambulated to the exam table with assistance Skin: Pustular rash over the chest  Portacath/PICC-without erythema  Lab Results:  Lab Results  Component Value Date   WBC 9.5 09/11/2021   HGB 12.3 (L) 09/11/2021   HCT 39.4 09/11/2021   MCV 83.3 09/11/2021   PLT 259 09/11/2021   NEUTROABS 6.4 09/11/2021    CMP  Lab Results  Component Value Date   NA 136 09/11/2021   K 3.9 09/11/2021   CL 98 09/11/2021   CO2 30 09/11/2021   GLUCOSE 231 (H) 09/11/2021   BUN 16 09/11/2021   CREATININE 0.85 09/11/2021   CALCIUM 9.4 09/11/2021   PROT 6.6 09/11/2021   ALBUMIN 4.0 09/11/2021   AST 15 09/11/2021   ALT 13 09/11/2021   ALKPHOS 53 09/11/2021   BILITOT 0.3 09/11/2021   GFRNONAA >60 09/11/2021   GFRAA >60 11/05/2019    Lab Results  Component Value Date   CEA1 1.77 01/04/2016   Medications: I have reviewed the patient's current medications.   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, L4 nerve compression Biopsy of the L4 mass 11/18/2015 confirmed metastatic renal cell carcinoma, clear cell type CTs of the chest, abdomen, and pelvis 11/18/2015-right lower lobe nodule, expansile lytic lesion at the right 11th rib/costal  vertebral junction, left renal mass, expansile lesion involving the L4 vertebra, lytic lesion at the left acetabulum, and a low-attenuation liver lesion Initiation of SRS to L4 12/02/2015, Completed 12/12/2015 Initiation of Pazopanib 12/30/2015 Pazopanib placed on hold 02/06/2016 secondary to elevated liver enzymes Pazopanib resumed 03/07/2016 at a dose of 400 mg daily  Pazopanib discontinued 03/19/2016 secondary to elevated liver enzymes Restaging CTs 04/02/2016-stable left renal mass, decreased soft tissue component associated with the L4 metastasis, increased soft tissue component associated with the right 11th rib metastasis with increased T11 bony destruction, increased sclerosis at the left acetabulum lesion Cycle 1 nivolumab 04/12/2016 Cycle 2 nivolumab 04/26/2016 Cycle 3 nivolumab 05/11/2016 Cycle 4 nivolumab 05/24/2016 Cycle 5 nivolumab 06/08/2016 MRI lumbar spine 06/21/2016-unchanged tumor at L3, increased size of retroperitoneal lymph nodes compared to a CT from 04/02/2016 Cycle 6 nivolumab  06/22/2016 CTs chest, abdomen, and pelvis 07/04/2016-enlargement of the left renal mass, right adrenal nodule, left hilar and peritoneal lymph nodes, enlargement of left acetabular lesion. Stable lung nodules. Cycle 7 nivolumab 07/06/2016 Cycle 8 nivolumab 07/20/2016 Cycle 9 nivolumab 08/02/2016 Cycle 10 nivolumab 08/20/2016 Restaging CT 09/03/2016 evaluation with stable disease Cycle 11 nivolumab 09/05/2016 Cycle 12 nivolumab 09/19/2016 Cycle 13 nivolumab 10/03/2016 Cycle 14 nivolumab 10/17/2016 Cycle 15 nivolumab 10/31/2016 Cycle 16 nivolumab 11/14/2016 (changed to monthly schedule) Cycle 17 nivolumab 12/19/2016 CTs 01/21/2017-increased left renal mass, increased size of adrenal metastases, increased lytic bone lesions, increased left lung nodule, persistent tumor at L4  with probable epidural component Initiation of Cabozantinib 01/28/2017 Restaging CTs 05/30/2017- decreased size of left  hilar mass, left renal mass, retroperitoneal adenopathy, and adrenal metastasis.  Healing bone lesions. Cabozantinib continued CTs 10/21/2017- interval enlargement left hilar lymph node; stable rib lesions; stable mass left renal cortex; stable mildly nodular adrenal glands; stable lytic lesions within the pelvis and spine. Cabozantinib continued CTs 02/21/2018- enlargement of an AP window lymph node.  Mildly enlarged left hilar lymph node is unchanged.  Primary renal cell carcinoma involving the upper pole of the left kidney appears similar.  Stable enlarged right periaortic lymph node adjacent to the renal vessels.  Stable multifocal bony metastatic disease. Cabozantinib continued CTs 07/29/2018- stable 15 mm AP window nodes; stable left hilar node; subcarinal node slightly larger; right periaortic node 16 mm, previously 12 mm; portacaval node 24 mm, previously 16 mm; 15 mm node superior to the pancreatic head has enlarged; interval increase nodularity of both adrenal glands; stable left kidney mass; multifocal bony metastatic disease not significantly changed. Cabozantinib continued CTs 11/06/2018- moderate improvement in thoracic adenopathy; minimal improvement abdominal adenopathy; left kidney upper pole mass and various lytic expansile bone lesions stable; mild increase in nodularity of left adrenal gland; right adrenal gland nodularity stable. 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  Peripheral enhancing masses consistent with metastases. No hemorrhage. MRI brain 03/11/2019-7 enhancing brain masses consistent with metastatic disease SRS to 7 brain lesions, treatment given 03/19/2019, 03/23/2019, and 03/25/2019 CTs 04/09/2019-decrease in left renal mass, bilateral adrenal nodules, AP window and porta hepatic adenopathy.  New 9 mm right lower lobe nodule.  Improved lytic lesion of the left  acetabulum.  Other bone lesions are stable. Cabozantinib continued MRI brain 07/17/2019-resolution of 4 mm treated lesion in the right frontal cortex, 6 remaining treated lesions have decreased in size, new punctate metastasis in the superior right cerebellum SRS to right cerebellar lesion 07/30/2019 CTs 08/17/2019-previously noted right lower lobe nodule resolved, stable left kidney mass, mixed lytic/sclerotic bone lesions in the thoracolumbar spine, right posterior ribs, and left acetabulum-unchanged, no evidence of progressive disease  Cabozantinib continued MRI brain 10/23/2019-stable to slight decrease in size of multiple enhancing intracranial lesions.  Slight increase in surrounding edema in the medial left frontal lobe.  No new lesions present. MRI brain 01/29/2020-multiple enhancing brain lesions, some hemorrhagic, some with mild enlargement-treatment effect? CTs 02/08/2020-no thoracic metastases, enlargement of the left renal mass, enlargement of lytic lesion at T9, other lytic lesions unchanged MRI brain 05/04/2020-no new lesions, slight enlargement of a right occipital lesion, other lesions are stable or decreased in size CTs 08/11/2020-enlargement of left kidney mass, new 1.6 cm segment 7 liver lesion, enlargement of a lytic lesion at T9, increased lysis of a sclerotic lesion at the right second rib, 0.7 cm endoluminal nodule at the bladder dome-enlarged Brain MRI 08/10/2020-slight increase in a 15 mm left frontal lobe lesion, new punctate focus in the right occipital cortex, other lesions stable or slightly decreased CTs 11/01/2020-increase in left suprahilar opacity, enlargement of previous liver metastases, several new subcentimeter lesions, increase in left renal mass, similar appearance of bone metastases, stable hyperdense/enhancing nodule in the left bladder Brain MRI 11/04/2020-no change in brain metastases Lenvatinib/pembrolizumab 11/30/2020 CTs 03/03/2021-stable left hilar lymph nodes;  resolution of left upper lobe perihilar nodularity; unchanged for millimeter peripheral right upper lobe nodule; multiple lytic bone lesions appear stable; left kidney lesion decreased in size; bilateral adrenal nodules decreased in size;  several liver lesion showed mild increase in size; several new small lesions within the right hepatic lobe. Lenvatinib/pembrolizumab continued 03/07/2021 Brain MRI 03/10/2021-progression of a left frontal metastasis, tiny new metastasis in the right frontal lobe-referred for SBRT to the right frontal lesion and to [facility] to consider a LITT procedure for the progressive left frontal lesion SRS 04/06/2021, LITT procedure at [facility] 04/18/2021-pathology metastatic renal cell carcinoma in background of necrosis/gliosis Lenvatinib on hold Pembrolizumab held 04/24/2021 Pembrolizumab 05/01/2021, lenvatinib remains on hold pending surgical evaluation Pembrolizumab 05/25/2021, lenvatinib resumed Pembrolizumab 06/15/2021 Lenvima resumed 06/21/2021 CTs 07/28/2021-continued regression left renal lesion.  Much improved appearance of the liver.  Continued regression of bilateral adrenal gland nodules.  Stable mixed lytic and sclerotic metastatic bone disease.  No new or progressive findings. Pembrolizumab 07/31/2021, lenvatinib continued Pembrolizumab held 08/21/2021 due to generalized weakness Pembrolizumab resumed 09/11/2021 Pain secondary to #1-managed by Dr. Lovenia Shuck.  Improved Hypertension Elevated transaminases 02/06/2016- Pazopanib placed on hold Liver enzymes normal 03/07/2016 Port-A-Cath placement 05/16/2016 Malaise/anorexia 09/05/2016. Cortisol and testosterone levels low. Hydrocortisone and testosterone replacement initiated. Conjunctival/scleral erythema 09/19/2016-resolved with steroid eyedrops Proximal right leg weakness. 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 Hypercalcemia status post Zometa 01/23/2017-resolved Pruritic rash following  IV contrast 10/21/2017; rash following IV contrast 02/21/2018 despite prednisone/Benadryl premedication Brief episodes of expressive aphasia October and December 2020 Vitamin B12 deficiency confirmed on lab 03/07/2021-started oral vitamin B12 replacement 03/28/2021      Disposition: Phillip [name] has metastatic renal cell carcinoma.  There is no clinical evidence of disease progression.  His overall status appears unchanged.  He will continue lenvatinib.  He will resume treatment with pembrolizumab today.  I suspect the malaise and somnolence are related to brain radiation and metastatic tumor burden.  He will return for an office visit and pembrolizumab in 3 weeks.  Betsy Coder, MD  09/11/2021  11:43 AM

## 2021-09-11 NOTE — Patient Instructions (Signed)
[facility]   Discharge Instructions: Thank you for choosing [facility] to provide your oncology and hematology care.   If you have a lab appointment with the Cancer Center, please go directly to the Cancer Center and check in at the registration area.   Wear comfortable clothing and clothing appropriate for easy access to any Portacath or PICC line.   We strive to give you quality time with your provider. You may need to reschedule your appointment if you arrive late (15 or more minutes).  Arriving late affects you and other patients whose appointments are after yours.  Also, if you miss three or more appointments without notifying the office, you may be dismissed from the clinic at the provider's discretion.      For prescription refill requests, have your pharmacy contact our office and allow 72 hours for refills to be completed.    Today you received the following chemotherapy and/or immunotherapy agents Pembrolizumab (KEYTRUDA).      To help prevent nausea and vomiting after your treatment, we encourage you to take your nausea medication as directed.  BELOW ARE SYMPTOMS THAT SHOULD BE REPORTED IMMEDIATELY: *FEVER GREATER THAN 100.4 F (38 C) OR HIGHER *CHILLS OR SWEATING *NAUSEA AND VOMITING THAT IS NOT CONTROLLED WITH YOUR NAUSEA MEDICATION *UNUSUAL SHORTNESS OF BREATH *UNUSUAL BRUISING OR BLEEDING *URINARY PROBLEMS (pain or burning when urinating, or frequent urination) *BOWEL PROBLEMS (unusual diarrhea, constipation, pain near the anus) TENDERNESS IN MOUTH AND THROAT WITH OR WITHOUT PRESENCE OF ULCERS (sore throat, sores in mouth, or a toothache) UNUSUAL RASH, SWELLING OR PAIN  UNUSUAL VAGINAL DISCHARGE OR ITCHING   Items with * indicate a potential emergency and should be followed up as soon as possible or go to the Emergency Department if any problems should occur.  Please show the CHEMOTHERAPY ALERT CARD or IMMUNOTHERAPY ALERT CARD  at check-in to the Emergency Department and triage nurse.  Should you have questions after your visit or need to cancel or reschedule your appointment, please contact [facility]  Dept: [phone]  and follow the prompts.  Office hours are 8:00 a.m. to 4:30 p.m. Monday - Friday. Please note that voicemails left after 4:00 p.m. may not be returned until the following business day.  We are closed weekends and major holidays. You have access to a nurse at all times for urgent questions. Please call the main number to the clinic Dept: [phone] and follow the prompts.   For any non-urgent questions, you may also contact your provider using MyChart. We now offer e-Visits for anyone 18 and older to request care online for non-urgent symptoms. For details visit [email or web address].   Also download the MyChart app! Go to the app store, search "MyChart", open the app, select [facility], and log in with your MyChart username and password.  Due to Covid, a mask is required upon entering the hospital/clinic. If you do not have a mask, one will be given to you upon arrival. For doctor visits, patients may have 1 support person aged 18 or older with them. For treatment visits, patients cannot have anyone with them due to current Covid guidelines and our immunocompromised population.   Pembrolizumab injection What is this medication? PEMBROLIZUMAB (pem broe liz ue mab) is a monoclonal antibody. It is used to treat certain types of cancer. This medicine may be used for other purposes; ask your health care provider or pharmacist if you have questions. COMMON BRAND NAME(S):   Keytruda What should I tell my care team before I take this medication? They need to know if you have any of these conditions: autoimmune diseases like Crohn's disease, ulcerative colitis, or lupus have had or planning to have an allogeneic stem cell transplant (uses someone else's stem cells) history of  organ transplant history of chest radiation nervous system problems like myasthenia gravis or Guillain-Barre syndrome an unusual or allergic reaction to pembrolizumab, other medicines, foods, dyes, or preservatives pregnant or trying to get pregnant breast-feeding How should I use this medication? This medicine is for infusion into a vein. It is given by a health care professional in a hospital or clinic setting. A special MedGuide will be given to you before each treatment. Be sure to read this information carefully each time. Talk to your pediatrician regarding the use of this medicine in children. While this drug may be prescribed for children as young as 6 months for selected conditions, precautions do apply. Overdosage: If you think you have taken too much of this medicine contact a poison control center or emergency room at once. NOTE: This medicine is only for you. Do not share this medicine with others. What if I miss a dose? It is important not to miss your dose. Call your doctor or health care professional if you are unable to keep an appointment. What may interact with this medication? Interactions have not been studied. This list may not describe all possible interactions. Give your health care provider a list of all the medicines, herbs, non-prescription drugs, or dietary supplements you use. Also tell them if you smoke, drink alcohol, or use illegal drugs. Some items may interact with your medicine. What should I watch for while using this medication? Your condition will be monitored carefully while you are receiving this medicine. You may need blood work done while you are taking this medicine. Do not become pregnant while taking this medicine or for 4 months after stopping it. Women should inform their doctor if they wish to become pregnant or think they might be pregnant. There is a potential for serious side effects to an unborn child. Talk to your health care professional or  pharmacist for more information. Do not breast-feed an infant while taking this medicine or for 4 months after the last dose. What side effects may I notice from receiving this medication? Side effects that you should report to your doctor or health care professional as soon as possible: allergic reactions like skin rash, itching or hives, swelling of the face, lips, or tongue bloody or black, tarry breathing problems changes in vision chest pain chills confusion constipation cough diarrhea dizziness or feeling faint or lightheaded fast or irregular heartbeat fever flushing joint pain low blood counts - this medicine may decrease the number of white blood cells, red blood cells and platelets. You may be at increased risk for infections and bleeding. muscle pain muscle weakness pain, tingling, numbness in the hands or feet persistent headache redness, blistering, peeling or loosening of the skin, including inside the mouth signs and symptoms of high blood sugar such as dizziness; dry mouth; dry skin; fruity breath; nausea; stomach pain; increased hunger or thirst; increased urination signs and symptoms of kidney injury like trouble passing urine or change in the amount of urine signs and symptoms of liver injury like dark urine, light-colored stools, loss of appetite, nausea, right upper belly pain, yellowing of the eyes or skin sweating swollen lymph nodes weight loss Side effects that usually do not   require medical attention (report to your doctor or health care professional if they continue or are bothersome): decreased appetite hair loss tiredness This list may not describe all possible side effects. Call your doctor for medical advice about side effects. You may report side effects to FDA at 1-800-FDA-1088. Where should I keep my medication? This drug is given in a hospital or clinic and will not be stored at home. NOTE: This sheet is a summary. It may not cover all possible  information. If you have questions about this medicine, talk to your doctor, pharmacist, or health care provider.  2023 Elsevier/Gold Standard (2021-01-13 00:00:00)  

## 2021-09-11 NOTE — Progress Notes (Signed)
Patient seen by Dr. Sherrill today ? ?Vitals are within treatment parameters. ? ?Labs reviewed by Dr. Sherrill and are within treatment parameters. ? ?Per physician team, patient is ready for treatment and there are NO modifications to the treatment plan.  ?

## 2021-09-11 NOTE — Patient Instructions (Signed)

## 2021-09-14 DIAGNOSIS — R2689 Other abnormalities of gait and mobility: Secondary | ICD-10-CM | POA: Diagnosis not present

## 2021-09-14 DIAGNOSIS — R531 Weakness: Secondary | ICD-10-CM | POA: Diagnosis not present

## 2021-09-14 DIAGNOSIS — M6281 Muscle weakness (generalized): Secondary | ICD-10-CM | POA: Diagnosis not present

## 2021-09-18 ENCOUNTER — Other Ambulatory Visit: Payer: Self-pay

## 2021-09-18 DIAGNOSIS — R2689 Other abnormalities of gait and mobility: Secondary | ICD-10-CM | POA: Diagnosis not present

## 2021-09-18 DIAGNOSIS — R531 Weakness: Secondary | ICD-10-CM | POA: Diagnosis not present

## 2021-09-18 DIAGNOSIS — M6281 Muscle weakness (generalized): Secondary | ICD-10-CM | POA: Diagnosis not present

## 2021-09-21 DIAGNOSIS — R2689 Other abnormalities of gait and mobility: Secondary | ICD-10-CM | POA: Diagnosis not present

## 2021-09-21 DIAGNOSIS — R531 Weakness: Secondary | ICD-10-CM | POA: Diagnosis not present

## 2021-09-21 DIAGNOSIS — M6281 Muscle weakness (generalized): Secondary | ICD-10-CM | POA: Diagnosis not present

## 2021-09-25 DIAGNOSIS — M6281 Muscle weakness (generalized): Secondary | ICD-10-CM | POA: Diagnosis not present

## 2021-09-25 DIAGNOSIS — R2689 Other abnormalities of gait and mobility: Secondary | ICD-10-CM | POA: Diagnosis not present

## 2021-09-25 DIAGNOSIS — R531 Weakness: Secondary | ICD-10-CM | POA: Diagnosis not present

## 2021-09-27 DIAGNOSIS — M6281 Muscle weakness (generalized): Secondary | ICD-10-CM | POA: Diagnosis not present

## 2021-09-27 DIAGNOSIS — R531 Weakness: Secondary | ICD-10-CM | POA: Diagnosis not present

## 2021-09-27 DIAGNOSIS — R2689 Other abnormalities of gait and mobility: Secondary | ICD-10-CM | POA: Diagnosis not present

## 2021-09-29 ENCOUNTER — Other Ambulatory Visit (HOSPITAL_COMMUNITY): Payer: Self-pay

## 2021-09-29 ENCOUNTER — Other Ambulatory Visit: Payer: Self-pay | Admitting: Oncology

## 2021-09-29 MED ORDER — LENVIMA (18 MG DAILY DOSE) 10 MG & 2 X 4 MG PO CPPK
18.0000 mg | ORAL_CAPSULE | Freq: Every day | ORAL | 0 refills | Status: DC
  Filled 2021-09-29: qty 90, 30d supply, fill #0

## 2021-10-01 ENCOUNTER — Other Ambulatory Visit: Payer: Self-pay | Admitting: Oncology

## 2021-10-02 ENCOUNTER — Other Ambulatory Visit: Payer: PPO

## 2021-10-02 ENCOUNTER — Other Ambulatory Visit: Payer: Self-pay | Admitting: *Deleted

## 2021-10-02 ENCOUNTER — Ambulatory Visit: Payer: PPO | Admitting: Oncology

## 2021-10-02 ENCOUNTER — Inpatient Hospital Stay (HOSPITAL_BASED_OUTPATIENT_CLINIC_OR_DEPARTMENT_OTHER): Payer: PPO | Admitting: Nurse Practitioner

## 2021-10-02 ENCOUNTER — Other Ambulatory Visit (HOSPITAL_COMMUNITY): Payer: Self-pay

## 2021-10-02 ENCOUNTER — Inpatient Hospital Stay: Payer: PPO | Attending: Nurse Practitioner

## 2021-10-02 ENCOUNTER — Inpatient Hospital Stay: Payer: PPO

## 2021-10-02 ENCOUNTER — Encounter: Payer: Self-pay | Admitting: Nurse Practitioner

## 2021-10-02 ENCOUNTER — Ambulatory Visit: Payer: PPO

## 2021-10-02 ENCOUNTER — Encounter: Payer: Self-pay | Admitting: *Deleted

## 2021-10-02 VITALS — BP 152/61 | HR 60 | Temp 98.1°F | Resp 18 | Ht 71.0 in | Wt 249.0 lb

## 2021-10-02 VITALS — BP 166/80 | HR 53 | Resp 18

## 2021-10-02 DIAGNOSIS — C7972 Secondary malignant neoplasm of left adrenal gland: Secondary | ICD-10-CM | POA: Diagnosis not present

## 2021-10-02 DIAGNOSIS — C7951 Secondary malignant neoplasm of bone: Secondary | ICD-10-CM | POA: Diagnosis not present

## 2021-10-02 DIAGNOSIS — I129 Hypertensive chronic kidney disease with stage 1 through stage 4 chronic kidney disease, or unspecified chronic kidney disease: Secondary | ICD-10-CM | POA: Insufficient documentation

## 2021-10-02 DIAGNOSIS — C7931 Secondary malignant neoplasm of brain: Secondary | ICD-10-CM | POA: Insufficient documentation

## 2021-10-02 DIAGNOSIS — N189 Chronic kidney disease, unspecified: Secondary | ICD-10-CM | POA: Diagnosis not present

## 2021-10-02 DIAGNOSIS — E538 Deficiency of other specified B group vitamins: Secondary | ICD-10-CM | POA: Diagnosis not present

## 2021-10-02 DIAGNOSIS — C642 Malignant neoplasm of left kidney, except renal pelvis: Secondary | ICD-10-CM

## 2021-10-02 DIAGNOSIS — Z807 Family history of other malignant neoplasms of lymphoid, hematopoietic and related tissues: Secondary | ICD-10-CM | POA: Diagnosis not present

## 2021-10-02 DIAGNOSIS — Z79899 Other long term (current) drug therapy: Secondary | ICD-10-CM | POA: Insufficient documentation

## 2021-10-02 DIAGNOSIS — C649 Malignant neoplasm of unspecified kidney, except renal pelvis: Secondary | ICD-10-CM | POA: Diagnosis not present

## 2021-10-02 DIAGNOSIS — Z5112 Encounter for antineoplastic immunotherapy: Secondary | ICD-10-CM | POA: Insufficient documentation

## 2021-10-02 LAB — CBC WITH DIFFERENTIAL (CANCER CENTER ONLY)
Abs Immature Granulocytes: 0.02 10*3/uL (ref 0.00–0.07)
Basophils Absolute: 0.1 10*3/uL (ref 0.0–0.1)
Basophils Relative: 1 %
Eosinophils Absolute: 0.5 10*3/uL (ref 0.0–0.5)
Eosinophils Relative: 6 %
HCT: 37.7 % — ABNORMAL LOW (ref 39.0–52.0)
Hemoglobin: 11.9 g/dL — ABNORMAL LOW (ref 13.0–17.0)
Immature Granulocytes: 0 %
Lymphocytes Relative: 20 %
Lymphs Abs: 1.7 10*3/uL (ref 0.7–4.0)
MCH: 26.3 pg (ref 26.0–34.0)
MCHC: 31.6 g/dL (ref 30.0–36.0)
MCV: 83.2 fL (ref 80.0–100.0)
Monocytes Absolute: 0.7 10*3/uL (ref 0.1–1.0)
Monocytes Relative: 9 %
Neutro Abs: 5.5 10*3/uL (ref 1.7–7.7)
Neutrophils Relative %: 64 %
Platelet Count: 265 10*3/uL (ref 150–400)
RBC: 4.53 MIL/uL (ref 4.22–5.81)
RDW: 16.4 % — ABNORMAL HIGH (ref 11.5–15.5)
WBC Count: 8.6 10*3/uL (ref 4.0–10.5)
nRBC: 0 % (ref 0.0–0.2)

## 2021-10-02 LAB — CMP (CANCER CENTER ONLY)
ALT: 11 U/L (ref 0–44)
AST: 11 U/L — ABNORMAL LOW (ref 15–41)
Albumin: 3.8 g/dL (ref 3.5–5.0)
Alkaline Phosphatase: 53 U/L (ref 38–126)
Anion gap: 11 (ref 5–15)
BUN: 17 mg/dL (ref 8–23)
CO2: 29 mmol/L (ref 22–32)
Calcium: 9.3 mg/dL (ref 8.9–10.3)
Chloride: 98 mmol/L (ref 98–111)
Creatinine: 0.96 mg/dL (ref 0.61–1.24)
GFR, Estimated: >60 mL/min (ref >60)
Glucose, Bld: 174 mg/dL — ABNORMAL HIGH (ref 70–99)
Potassium: 3.9 mmol/L (ref 3.5–5.1)
Sodium: 138 mmol/L (ref 135–145)
Total Bilirubin: 0.3 mg/dL (ref 0.3–1.2)
Total Protein: 6.7 g/dL (ref 6.5–8.1)

## 2021-10-02 LAB — TOTAL PROTEIN, URINE DIPSTICK: Protein, ur: 100 mg/dL — AB

## 2021-10-02 MED ORDER — SODIUM CHLORIDE 0.9% FLUSH
10.0000 mL | INTRAVENOUS | Status: DC | PRN
  Administered 2021-10-02: 10 mL

## 2021-10-02 MED ORDER — SODIUM CHLORIDE 0.9 % IV SOLN: Freq: Once | INTRAVENOUS | Status: AC

## 2021-10-02 MED ORDER — HEPARIN SOD (PORK) LOCK FLUSH 100 UNIT/ML IV SOLN
500.0000 [IU] | Freq: Once | INTRAVENOUS | Status: AC | PRN
  Administered 2021-10-02: 500 [IU]

## 2021-10-02 MED ORDER — SODIUM CHLORIDE 0.9 % IV SOLN
200.0000 mg | Freq: Once | INTRAVENOUS | Status: AC
  Administered 2021-10-02: 200 mg via INTRAVENOUS
  Filled 2021-10-02: qty 8

## 2021-10-02 NOTE — Patient Instructions (Signed)
[facility]   Discharge Instructions: Thank you for choosing [facility] to provide your oncology and hematology care.   If you have a lab appointment with the Cancer Center, please go directly to the Cancer Center and check in at the registration area.   Wear comfortable clothing and clothing appropriate for easy access to any Portacath or PICC line.   We strive to give you quality time with your provider. You may need to reschedule your appointment if you arrive late (15 or more minutes).  Arriving late affects you and other patients whose appointments are after yours.  Also, if you miss three or more appointments without notifying the office, you may be dismissed from the clinic at the provider's discretion.      For prescription refill requests, have your pharmacy contact our office and allow 72 hours for refills to be completed.    Today you received the following chemotherapy and/or immunotherapy agents Pembrolizumab (KEYTRUDA).      To help prevent nausea and vomiting after your treatment, we encourage you to take your nausea medication as directed.  BELOW ARE SYMPTOMS THAT SHOULD BE REPORTED IMMEDIATELY: *FEVER GREATER THAN 100.4 F (38 C) OR HIGHER *CHILLS OR SWEATING *NAUSEA AND VOMITING THAT IS NOT CONTROLLED WITH YOUR NAUSEA MEDICATION *UNUSUAL SHORTNESS OF BREATH *UNUSUAL BRUISING OR BLEEDING *URINARY PROBLEMS (pain or burning when urinating, or frequent urination) *BOWEL PROBLEMS (unusual diarrhea, constipation, pain near the anus) TENDERNESS IN MOUTH AND THROAT WITH OR WITHOUT PRESENCE OF ULCERS (sore throat, sores in mouth, or a toothache) UNUSUAL RASH, SWELLING OR PAIN  UNUSUAL VAGINAL DISCHARGE OR ITCHING   Items with * indicate a potential emergency and should be followed up as soon as possible or go to the Emergency Department if any problems should occur.  Please show the CHEMOTHERAPY ALERT CARD or IMMUNOTHERAPY ALERT CARD  at check-in to the Emergency Department and triage nurse.  Should you have questions after your visit or need to cancel or reschedule your appointment, please contact [facility]  Dept: [phone]  and follow the prompts.  Office hours are 8:00 a.m. to 4:30 p.m. Monday - Friday. Please note that voicemails left after 4:00 p.m. may not be returned until the following business day.  We are closed weekends and major holidays. You have access to a nurse at all times for urgent questions. Please call the main number to the clinic Dept: [phone] and follow the prompts.   For any non-urgent questions, you may also contact your provider using MyChart. We now offer e-Visits for anyone 18 and older to request care online for non-urgent symptoms. For details visit [email or web address].   Also download the MyChart app! Go to the app store, search "MyChart", open the app, select [facility], and log in with your MyChart username and password.  Due to Covid, a mask is required upon entering the hospital/clinic. If you do not have a mask, one will be given to you upon arrival. For doctor visits, patients may have 1 support person aged 18 or older with them. For treatment visits, patients cannot have anyone with them due to current Covid guidelines and our immunocompromised population.   Pembrolizumab injection What is this medication? PEMBROLIZUMAB (pem broe liz ue mab) is a monoclonal antibody. It is used to treat certain types of cancer. This medicine may be used for other purposes; ask your health care provider or pharmacist if you have questions. COMMON BRAND NAME(S):   Keytruda What should I tell my care team before I take this medication? They need to know if you have any of these conditions: autoimmune diseases like Crohn's disease, ulcerative colitis, or lupus have had or planning to have an allogeneic stem cell transplant (uses someone else's stem cells) history of  organ transplant history of chest radiation nervous system problems like myasthenia gravis or Guillain-Barre syndrome an unusual or allergic reaction to pembrolizumab, other medicines, foods, dyes, or preservatives pregnant or trying to get pregnant breast-feeding How should I use this medication? This medicine is for infusion into a vein. It is given by a health care professional in a hospital or clinic setting. A special MedGuide will be given to you before each treatment. Be sure to read this information carefully each time. Talk to your pediatrician regarding the use of this medicine in children. While this drug may be prescribed for children as young as 6 months for selected conditions, precautions do apply. Overdosage: If you think you have taken too much of this medicine contact a poison control center or emergency room at once. NOTE: This medicine is only for you. Do not share this medicine with others. What if I miss a dose? It is important not to miss your dose. Call your doctor or health care professional if you are unable to keep an appointment. What may interact with this medication? Interactions have not been studied. This list may not describe all possible interactions. Give your health care provider a list of all the medicines, herbs, non-prescription drugs, or dietary supplements you use. Also tell them if you smoke, drink alcohol, or use illegal drugs. Some items may interact with your medicine. What should I watch for while using this medication? Your condition will be monitored carefully while you are receiving this medicine. You may need blood work done while you are taking this medicine. Do not become pregnant while taking this medicine or for 4 months after stopping it. Women should inform their doctor if they wish to become pregnant or think they might be pregnant. There is a potential for serious side effects to an unborn child. Talk to your health care professional or  pharmacist for more information. Do not breast-feed an infant while taking this medicine or for 4 months after the last dose. What side effects may I notice from receiving this medication? Side effects that you should report to your doctor or health care professional as soon as possible: allergic reactions like skin rash, itching or hives, swelling of the face, lips, or tongue bloody or black, tarry breathing problems changes in vision chest pain chills confusion constipation cough diarrhea dizziness or feeling faint or lightheaded fast or irregular heartbeat fever flushing joint pain low blood counts - this medicine may decrease the number of white blood cells, red blood cells and platelets. You may be at increased risk for infections and bleeding. muscle pain muscle weakness pain, tingling, numbness in the hands or feet persistent headache redness, blistering, peeling or loosening of the skin, including inside the mouth signs and symptoms of high blood sugar such as dizziness; dry mouth; dry skin; fruity breath; nausea; stomach pain; increased hunger or thirst; increased urination signs and symptoms of kidney injury like trouble passing urine or change in the amount of urine signs and symptoms of liver injury like dark urine, light-colored stools, loss of appetite, nausea, right upper belly pain, yellowing of the eyes or skin sweating swollen lymph nodes weight loss Side effects that usually do not   require medical attention (report to your doctor or health care professional if they continue or are bothersome): decreased appetite hair loss tiredness This list may not describe all possible side effects. Call your doctor for medical advice about side effects. You may report side effects to FDA at 1-800-FDA-1088. Where should I keep my medication? This drug is given in a hospital or clinic and will not be stored at home. NOTE: This sheet is a summary. It may not cover all possible  information. If you have questions about this medicine, talk to your doctor, pharmacist, or health care provider.  2023 Elsevier/Gold Standard (2021-01-13 00:00:00)  

## 2021-10-02 NOTE — Progress Notes (Signed)
[facility] OFFICE PROGRESS NOTE   Diagnosis: Renal cell carcinoma  INTERVAL HISTORY:   Phillip [name] returns as scheduled.  He continues lenvatinib.  Pembrolizumab resumed 09/11/2021.  He denies nausea/vomiting.  No diarrhea.  No change in baseline rash on the trunk.  He reports generalized "aching".  This has been occurring for about 3 months.  He is weak.  He sleeps a lot.  Objective:  Vital signs in last 24 hours:  Blood pressure (!) 152/61, pulse 60, temperature 98.1 F (36.7 C), temperature source Oral, resp. rate 18, height '5\' 11"'$  (1.803 m), weight 249 lb (112.9 kg), SpO2 100 %.    HEENT: No thrush or ulcers. Resp: Lungs clear bilaterally. Cardio: Regular rate and rhythm. GI: No hepatosplenomegaly. Vascular: Trace edema lower leg bilaterally. Skin: Pustular rash over the chest and upper abdomen. Port-A-Cath without erythema   Lab Results:  Lab Results  Component Value Date   WBC 8.6 10/02/2021   HGB 11.9 (L) 10/02/2021   HCT 37.7 (L) 10/02/2021   MCV 83.2 10/02/2021   PLT 265 10/02/2021   NEUTROABS 5.5 10/02/2021    Imaging:  No results found.  Medications: I have reviewed the patient's current medications.  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, L4 nerve compression Biopsy of the L4 mass 11/18/2015 confirmed metastatic renal cell carcinoma, clear cell type CTs of the chest, abdomen, and pelvis 11/18/2015-right lower lobe nodule, expansile lytic lesion at the right 11th rib/costal vertebral junction, left renal mass, expansile lesion involving the L4 vertebra, lytic lesion at the left acetabulum, and a low-attenuation liver lesion Initiation of SRS to L4 12/02/2015, Completed 12/12/2015 Initiation of Pazopanib 12/30/2015 Pazopanib placed on hold 02/06/2016 secondary to elevated liver enzymes Pazopanib resumed 03/07/2016 at a dose of 400 mg daily  Pazopanib discontinued 03/19/2016 secondary to  elevated liver enzymes Restaging CTs 04/02/2016-stable left renal mass, decreased soft tissue component associated with the L4 metastasis, increased soft tissue component associated with the right 11th rib metastasis with increased T11 bony destruction, increased sclerosis at the left acetabulum lesion Cycle 1 nivolumab 04/12/2016 Cycle 2 nivolumab 04/26/2016 Cycle 3 nivolumab 05/11/2016 Cycle 4 nivolumab 05/24/2016 Cycle 5 nivolumab 06/08/2016 MRI lumbar spine 06/21/2016-unchanged tumor at L3, increased size of retroperitoneal lymph nodes compared to a CT from 04/02/2016 Cycle 6 nivolumab  06/22/2016 CTs chest, abdomen, and pelvis 07/04/2016-enlargement of the left renal mass, right adrenal nodule, left hilar and peritoneal lymph nodes, enlargement of left acetabular lesion. Stable lung nodules. Cycle 7 nivolumab 07/06/2016 Cycle 8 nivolumab 07/20/2016 Cycle 9 nivolumab 08/02/2016 Cycle 10 nivolumab 08/20/2016 Restaging CT 09/03/2016 evaluation with stable disease Cycle 11 nivolumab 09/05/2016 Cycle 12 nivolumab 09/19/2016 Cycle 13 nivolumab 10/03/2016 Cycle 14 nivolumab 10/17/2016 Cycle 15 nivolumab 10/31/2016 Cycle 16 nivolumab 11/14/2016 (changed to monthly schedule) Cycle 17 nivolumab 12/19/2016 CTs 01/21/2017-increased left renal mass, increased size of adrenal metastases, increased lytic bone lesions, increased left lung nodule, persistent tumor at L4 with probable epidural component Initiation of Cabozantinib 01/28/2017 Restaging CTs 05/30/2017- decreased size of left hilar mass, left renal mass, retroperitoneal adenopathy, and adrenal metastasis.  Healing bone lesions. Cabozantinib continued CTs 10/21/2017- interval enlargement left hilar lymph node; stable rib lesions; stable mass left renal cortex; stable mildly nodular adrenal glands; stable lytic lesions within the pelvis and spine. Cabozantinib continued CTs 02/21/2018- enlargement of an AP window lymph node.  Mildly enlarged  left hilar lymph node is unchanged.  Primary renal cell carcinoma involving the upper pole of the left kidney  appears similar.  Stable enlarged right periaortic lymph node adjacent to the renal vessels.  Stable multifocal bony metastatic disease. Cabozantinib continued CTs 07/29/2018- stable 15 mm AP window nodes; stable left hilar node; subcarinal node slightly larger; right periaortic node 16 mm, previously 12 mm; portacaval node 24 mm, previously 16 mm; 15 mm node superior to the pancreatic head has enlarged; interval increase nodularity of both adrenal glands; stable left kidney mass; multifocal bony metastatic disease not significantly changed. Cabozantinib continued CTs 11/06/2018- moderate improvement in thoracic adenopathy; minimal improvement abdominal adenopathy; left kidney upper pole mass and various lytic expansile bone lesions stable; mild increase in nodularity of left adrenal gland; right adrenal gland nodularity stable. 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  Peripheral enhancing masses consistent with metastases. No hemorrhage. MRI brain 03/11/2019-7 enhancing brain masses consistent with metastatic disease SRS to 7 brain lesions, treatment given 03/19/2019, 03/23/2019, and 03/25/2019 CTs 04/09/2019-decrease in left renal mass, bilateral adrenal nodules, AP window and porta hepatic adenopathy.  New 9 mm right lower lobe nodule.  Improved lytic lesion of the left acetabulum.  Other bone lesions are stable. Cabozantinib continued MRI brain 07/17/2019-resolution of 4 mm treated lesion in the right frontal cortex, 6 remaining treated lesions have decreased in size, new punctate metastasis in the superior right cerebellum SRS to right cerebellar lesion 07/30/2019 CTs 08/17/2019-previously noted right lower lobe nodule resolved, stable left kidney mass, mixed lytic/sclerotic bone lesions  in the thoracolumbar spine, right posterior ribs, and left acetabulum-unchanged, no evidence of progressive disease  Cabozantinib continued MRI brain 10/23/2019-stable to slight decrease in size of multiple enhancing intracranial lesions.  Slight increase in surrounding edema in the medial left frontal lobe.  No new lesions present. MRI brain 01/29/2020-multiple enhancing brain lesions, some hemorrhagic, some with mild enlargement-treatment effect? CTs 02/08/2020-no thoracic metastases, enlargement of the left renal mass, enlargement of lytic lesion at T9, other lytic lesions unchanged MRI brain 05/04/2020-no new lesions, slight enlargement of a right occipital lesion, other lesions are stable or decreased in size CTs 08/11/2020-enlargement of left kidney mass, new 1.6 cm segment 7 liver lesion, enlargement of a lytic lesion at T9, increased lysis of a sclerotic lesion at the right second rib, 0.7 cm endoluminal nodule at the bladder dome-enlarged Brain MRI 08/10/2020-slight increase in a 15 mm left frontal lobe lesion, new punctate focus in the right occipital cortex, other lesions stable or slightly decreased CTs 11/01/2020-increase in left suprahilar opacity, enlargement of previous liver metastases, several new subcentimeter lesions, increase in left renal mass, similar appearance of bone metastases, stable hyperdense/enhancing nodule in the left bladder Brain MRI 11/04/2020-no change in brain metastases Lenvatinib/pembrolizumab 11/30/2020 CTs 03/03/2021-stable left hilar lymph nodes; resolution of left upper lobe perihilar nodularity; unchanged for millimeter peripheral right upper lobe nodule; multiple lytic bone lesions appear stable; left kidney lesion decreased in size; bilateral adrenal nodules decreased in size; several liver lesion showed mild increase in size; several new small lesions within the right hepatic lobe. Lenvatinib/pembrolizumab continued 03/07/2021 Brain MRI 03/10/2021-progression of a left  frontal metastasis, tiny new metastasis in the right frontal lobe-referred for SBRT to the right frontal lesion and to [facility] to consider a LITT procedure for the progressive left frontal lesion SRS 04/06/2021, LITT procedure at [facility] 04/18/2021-pathology metastatic renal cell carcinoma in background of necrosis/gliosis Lenvatinib on hold Pembrolizumab held 04/24/2021 Pembrolizumab 05/01/2021, lenvatinib remains on hold pending surgical evaluation Pembrolizumab 05/25/2021, lenvatinib resumed Pembrolizumab 06/15/2021  Phillip [name] resumed 06/21/2021 CTs 07/28/2021-continued regression left renal lesion.  Much improved appearance of the liver.  Continued regression of bilateral adrenal gland nodules.  Stable mixed lytic and sclerotic metastatic bone disease.  No new or progressive findings. Pembrolizumab 07/31/2021, lenvatinib continued Pembrolizumab held 08/21/2021 due to generalized weakness Pembrolizumab resumed 09/11/2021 Pembrolizumab 10/02/2021 Pain secondary to #1-managed by Phillip [name].  Improved Hypertension Elevated transaminases 02/06/2016- Pazopanib placed on hold Liver enzymes normal 03/07/2016 Port-A-Cath placement 05/16/2016 Malaise/anorexia 09/05/2016. Cortisol and testosterone levels low. Hydrocortisone and testosterone replacement initiated. Conjunctival/scleral erythema 09/19/2016-resolved with steroid eyedrops Proximal right leg weakness. 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 Hypercalcemia status post Zometa 01/23/2017-resolved Pruritic rash following IV contrast 10/21/2017; rash following IV contrast 02/21/2018 despite prednisone/Benadryl premedication Brief episodes of expressive aphasia October and December 2020 Vitamin B12 deficiency confirmed on lab 03/07/2021-started oral vitamin B12 replacement 03/28/2021 Arthralgias-question related to Pembrolizumab      Disposition: Mr. Elman appears unchanged.  There is no clinical evidence of disease  progression.  Plan to continue lenvatinib and Pembrolizumab.  CBC and chemistry panel reviewed.  Labs adequate to proceed as above.    The arthralgias may be related to Pembrolizumab.  He is currently taking oxycodone twice a day.  He understands he can take it every 6 hours as needed.  He will contact the office if this is not effective.  He will continue Tylenol and ibuprofen as needed.  He will return for follow-up in 3 weeks.  We are available to see him sooner if needed.      Phillip [name] ANP/GNP-BC   10/02/2021  12:20 PM

## 2021-10-02 NOTE — Progress Notes (Signed)
Patient seen by Lisa Thomas NP today  Vitals are within treatment parameters.  Labs reviewed by Lisa Thomas NP and are within treatment parameters.  Per physician team, patient is ready for treatment and there are NO modifications to the treatment plan.     

## 2021-10-03 ENCOUNTER — Other Ambulatory Visit: Payer: Self-pay | Admitting: Nurse Practitioner

## 2021-10-03 ENCOUNTER — Other Ambulatory Visit: Payer: Self-pay | Admitting: Internal Medicine

## 2021-10-03 ENCOUNTER — Other Ambulatory Visit: Payer: Self-pay

## 2021-10-03 ENCOUNTER — Other Ambulatory Visit (HOSPITAL_COMMUNITY): Payer: Self-pay

## 2021-10-03 DIAGNOSIS — C649 Malignant neoplasm of unspecified kidney, except renal pelvis: Secondary | ICD-10-CM

## 2021-10-03 MED ORDER — OXYCODONE HCL 5 MG PO TABS
5.0000 mg | ORAL_TABLET | Freq: Four times a day (QID) | ORAL | 0 refills | Status: DC | PRN
  Filled 2021-10-03: qty 60, 15d supply, fill #0

## 2021-10-03 MED ORDER — OXYCODONE HCL ER 10 MG PO T12A
EXTENDED_RELEASE_TABLET | Freq: Two times a day (BID) | ORAL | 0 refills | Status: DC
  Filled 2021-10-03: qty 120, 30d supply, fill #0

## 2021-10-04 ENCOUNTER — Other Ambulatory Visit (HOSPITAL_COMMUNITY): Payer: Self-pay

## 2021-10-05 ENCOUNTER — Other Ambulatory Visit: Payer: Self-pay

## 2021-10-05 ENCOUNTER — Encounter: Payer: Self-pay | Admitting: Oncology

## 2021-10-05 ENCOUNTER — Other Ambulatory Visit (HOSPITAL_COMMUNITY): Payer: Self-pay

## 2021-10-05 MED ORDER — LEVETIRACETAM 500 MG PO TABS
ORAL_TABLET | Freq: Two times a day (BID) | ORAL | 2 refills | Status: DC
Start: 2021-10-05 — End: 2021-11-13
  Filled 2021-10-05: qty 60, 30d supply, fill #0
  Filled 2021-10-31: qty 60, 30d supply, fill #1

## 2021-10-10 ENCOUNTER — Other Ambulatory Visit (HOSPITAL_COMMUNITY): Payer: Self-pay

## 2021-10-10 DIAGNOSIS — N5089 Other specified disorders of the male genital organs: Secondary | ICD-10-CM | POA: Diagnosis not present

## 2021-10-10 DIAGNOSIS — N501 Vascular disorders of male genital organs: Secondary | ICD-10-CM | POA: Diagnosis not present

## 2021-10-10 DIAGNOSIS — Q559 Congenital malformation of male genital organ, unspecified: Secondary | ICD-10-CM | POA: Diagnosis not present

## 2021-10-10 MED ORDER — SULFAMETHOXAZOLE-TRIMETHOPRIM 800-160 MG PO TABS
ORAL_TABLET | ORAL | 0 refills | Status: DC
  Filled 2021-10-10: qty 20, 10d supply, fill #0

## 2021-10-11 ENCOUNTER — Telehealth: Payer: Self-pay

## 2021-10-11 NOTE — Telephone Encounter (Signed)
Left VM on both patient and spouses Vm requesting that they call [facility] [phone] to schedule MRI by 10/20/21.

## 2021-10-19 ENCOUNTER — Other Ambulatory Visit (HOSPITAL_COMMUNITY): Payer: Self-pay

## 2021-10-19 ENCOUNTER — Ambulatory Visit
Admission: RE | Admit: 2021-10-19 | Discharge: 2021-10-19 | Disposition: A | Discharge status: Home / Self Care | Payer: PPO | Source: Ambulatory Visit | Attending: Internal Medicine | Admitting: Internal Medicine

## 2021-10-19 DIAGNOSIS — C7931 Secondary malignant neoplasm of brain: Secondary | ICD-10-CM

## 2021-10-19 DIAGNOSIS — C719 Malignant neoplasm of brain, unspecified: Secondary | ICD-10-CM | POA: Diagnosis not present

## 2021-10-19 DIAGNOSIS — G939 Disorder of brain, unspecified: Secondary | ICD-10-CM | POA: Diagnosis not present

## 2021-10-19 DIAGNOSIS — I639 Cerebral infarction, unspecified: Secondary | ICD-10-CM | POA: Diagnosis not present

## 2021-10-19 MED ORDER — GADOBENATE DIMEGLUMINE 529 MG/ML IV SOLN
20.0000 mL | Freq: Once | INTRAVENOUS | Status: AC | PRN
  Administered 2021-10-19: 20 mL via INTRAVENOUS

## 2021-10-19 MED ORDER — SODIUM CHLORIDE 0.9% FLUSH
10.0000 mL | INTRAVENOUS | Status: DC | PRN
  Administered 2021-10-19: 10 mL via INTRAVENOUS

## 2021-10-19 MED ORDER — HEPARIN SOD (PORK) LOCK FLUSH 100 UNIT/ML IV SOLN
500.0000 [IU] | Freq: Once | INTRAVENOUS | Status: AC
  Administered 2021-10-19: 500 [IU] via INTRAVENOUS

## 2021-10-19 MED ORDER — LEVOBUNOLOL HCL 0.5 % OP SOLN
OPHTHALMIC | 99 refills | Status: DC
  Filled 2021-10-19: qty 5, 40d supply, fill #0

## 2021-10-20 ENCOUNTER — Other Ambulatory Visit (HOSPITAL_COMMUNITY): Payer: Self-pay

## 2021-10-22 ENCOUNTER — Other Ambulatory Visit: Payer: Self-pay | Admitting: Oncology

## 2021-10-23 ENCOUNTER — Inpatient Hospital Stay: Payer: PPO

## 2021-10-23 ENCOUNTER — Other Ambulatory Visit (HOSPITAL_BASED_OUTPATIENT_CLINIC_OR_DEPARTMENT_OTHER): Payer: Self-pay

## 2021-10-23 ENCOUNTER — Encounter: Payer: Self-pay | Admitting: Nurse Practitioner

## 2021-10-23 ENCOUNTER — Inpatient Hospital Stay (HOSPITAL_BASED_OUTPATIENT_CLINIC_OR_DEPARTMENT_OTHER): Payer: PPO | Admitting: Nurse Practitioner

## 2021-10-23 VITALS — BP 146/58 | HR 59 | Temp 98.1°F | Resp 19 | Wt 249.8 lb

## 2021-10-23 VITALS — BP 151/62 | HR 49

## 2021-10-23 DIAGNOSIS — C649 Malignant neoplasm of unspecified kidney, except renal pelvis: Secondary | ICD-10-CM

## 2021-10-23 DIAGNOSIS — C7931 Secondary malignant neoplasm of brain: Secondary | ICD-10-CM

## 2021-10-23 DIAGNOSIS — R5383 Other fatigue: Secondary | ICD-10-CM | POA: Diagnosis not present

## 2021-10-23 DIAGNOSIS — Z5112 Encounter for antineoplastic immunotherapy: Secondary | ICD-10-CM | POA: Diagnosis not present

## 2021-10-23 DIAGNOSIS — C642 Malignant neoplasm of left kidney, except renal pelvis: Secondary | ICD-10-CM | POA: Diagnosis not present

## 2021-10-23 DIAGNOSIS — Z95828 Presence of other vascular implants and grafts: Secondary | ICD-10-CM

## 2021-10-23 LAB — CMP (CANCER CENTER ONLY)
ALT: 11 U/L (ref 0–44)
AST: 14 U/L — ABNORMAL LOW (ref 15–41)
Albumin: 3.8 g/dL (ref 3.5–5.0)
Alkaline Phosphatase: 61 U/L (ref 38–126)
Anion gap: 10 (ref 5–15)
BUN: 24 mg/dL — ABNORMAL HIGH (ref 8–23)
CO2: 26 mmol/L (ref 22–32)
Calcium: 8.8 mg/dL — ABNORMAL LOW (ref 8.9–10.3)
Chloride: 101 mmol/L (ref 98–111)
Creatinine: 1.08 mg/dL (ref 0.61–1.24)
GFR, Estimated: >60 mL/min (ref >60)
Glucose, Bld: 157 mg/dL — ABNORMAL HIGH (ref 70–99)
Potassium: 4.5 mmol/L (ref 3.5–5.1)
Sodium: 137 mmol/L (ref 135–145)
Total Bilirubin: 0.4 mg/dL (ref 0.3–1.2)
Total Protein: 6.4 g/dL — ABNORMAL LOW (ref 6.5–8.1)

## 2021-10-23 LAB — TSH: TSH: 3.598 u[IU]/mL (ref 0.350–4.500)

## 2021-10-23 LAB — CBC WITH DIFFERENTIAL (CANCER CENTER ONLY)
Abs Immature Granulocytes: 0.02 10*3/uL (ref 0.00–0.07)
Basophils Absolute: 0 10*3/uL (ref 0.0–0.1)
Basophils Relative: 0 %
Eosinophils Absolute: 1 10*3/uL — ABNORMAL HIGH (ref 0.0–0.5)
Eosinophils Relative: 15 %
HCT: 36 % — ABNORMAL LOW (ref 39.0–52.0)
Hemoglobin: 11.3 g/dL — ABNORMAL LOW (ref 13.0–17.0)
Immature Granulocytes: 0 %
Lymphocytes Relative: 18 %
Lymphs Abs: 1.2 10*3/uL (ref 0.7–4.0)
MCH: 26.3 pg (ref 26.0–34.0)
MCHC: 31.4 g/dL (ref 30.0–36.0)
MCV: 83.9 fL (ref 80.0–100.0)
Monocytes Absolute: 1 10*3/uL (ref 0.1–1.0)
Monocytes Relative: 14 %
Neutro Abs: 3.5 10*3/uL (ref 1.7–7.7)
Neutrophils Relative %: 53 %
Platelet Count: 198 10*3/uL (ref 150–400)
RBC: 4.29 MIL/uL (ref 4.22–5.81)
RDW: 17.2 % — ABNORMAL HIGH (ref 11.5–15.5)
WBC Count: 6.7 10*3/uL (ref 4.0–10.5)
nRBC: 0 % (ref 0.0–0.2)

## 2021-10-23 LAB — T4, FREE: Free T4: 0.7 ng/dL (ref 0.61–1.12)

## 2021-10-23 MED ORDER — ALTEPLASE 2 MG IJ SOLR
2.0000 mg | Freq: Once | INTRAMUSCULAR | Status: AC
  Administered 2021-10-23: 2 mg
  Filled 2021-10-23: qty 2

## 2021-10-23 MED ORDER — SODIUM CHLORIDE 0.9 % IV SOLN
200.0000 mg | Freq: Once | INTRAVENOUS | Status: AC
  Administered 2021-10-23: 200 mg via INTRAVENOUS
  Filled 2021-10-23: qty 8

## 2021-10-23 MED ORDER — HEPARIN SOD (PORK) LOCK FLUSH 100 UNIT/ML IV SOLN
500.0000 [IU] | Freq: Once | INTRAVENOUS | Status: AC | PRN
  Administered 2021-10-23: 500 [IU]

## 2021-10-23 MED ORDER — SODIUM CHLORIDE 0.9 % IV SOLN: Freq: Once | INTRAVENOUS | Status: AC

## 2021-10-23 MED ORDER — SODIUM CHLORIDE 0.9% FLUSH
10.0000 mL | INTRAVENOUS | Status: DC | PRN
  Administered 2021-10-23: 10 mL

## 2021-10-23 NOTE — Patient Instructions (Signed)

## 2021-10-23 NOTE — Progress Notes (Addendum)
Phillip [name] OFFICE PROGRESS NOTE   Diagnosis: Renal cell carcinoma  INTERVAL HISTORY:   Phillip [name] returns as scheduled.  He continues lenvatinib.  He denies nausea/vomiting.  No mouth sores.  No significant diarrhea.  No change in baseline skin rash.  Appetite described as okay.  He reports arthralgias.  The arthralgias resolved when he stopped eating grits.  He intermittently feels hot.  He is gaining weight.  He recently scratched himself in his sleep right upper inner groin.  Objective:  Vital signs in last 24 hours:  Blood pressure (!) 146/58, pulse (!) 59, temperature 98.1 F (36.7 C), temperature source Tympanic, resp. rate 19, weight 249 lb 12.8 oz (113.3 kg), SpO2 97 %.    HEENT: No thrush or ulcers. Resp: Lungs clear bilaterally. Cardio: Regular rate and rhythm. GI: Abdomen soft and nontender.  No hepatomegaly. Vascular: Trace edema lower leg bilaterally. Skin: Healing superficial ulceration right upper inner groin region. Port-A-Cath without erythema.  Lab Results:  Lab Results  Component Value Date   WBC 6.7 10/23/2021   HGB 11.3 (L) 10/23/2021   HCT 36.0 (L) 10/23/2021   MCV 83.9 10/23/2021   PLT 198 10/23/2021   NEUTROABS 3.5 10/23/2021    Imaging:  No results found.  Medications: I have reviewed the patient's current medications.  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, L4 nerve compression Biopsy of the L4 mass 11/18/2015 confirmed metastatic renal cell carcinoma, clear cell type CTs of the chest, abdomen, and pelvis 11/18/2015-right lower lobe nodule, expansile lytic lesion at the right 11th rib/costal vertebral junction, left renal mass, expansile lesion involving the L4 vertebra, lytic lesion at the left acetabulum, and a low-attenuation liver lesion Initiation of SRS to L4 12/02/2015, Completed 12/12/2015 Initiation of Pazopanib 12/30/2015 Pazopanib placed on hold 02/06/2016 secondary  to elevated liver enzymes Pazopanib resumed 03/07/2016 at a dose of 400 mg daily  Pazopanib discontinued 03/19/2016 secondary to elevated liver enzymes Restaging CTs 04/02/2016-stable left renal mass, decreased soft tissue component associated with the L4 metastasis, increased soft tissue component associated with the right 11th rib metastasis with increased T11 bony destruction, increased sclerosis at the left acetabulum lesion Cycle 1 nivolumab 04/12/2016 Cycle 2 nivolumab 04/26/2016 Cycle 3 nivolumab 05/11/2016 Cycle 4 nivolumab 05/24/2016 Cycle 5 nivolumab 06/08/2016 MRI lumbar spine 06/21/2016-unchanged tumor at L3, increased size of retroperitoneal lymph nodes compared to a CT from 04/02/2016 Cycle 6 nivolumab  06/22/2016 CTs chest, abdomen, and pelvis 07/04/2016-enlargement of the left renal mass, right adrenal nodule, left hilar and peritoneal lymph nodes, enlargement of left acetabular lesion. Stable lung nodules. Cycle 7 nivolumab 07/06/2016 Cycle 8 nivolumab 07/20/2016 Cycle 9 nivolumab 08/02/2016 Cycle 10 nivolumab 08/20/2016 Restaging CT 09/03/2016 evaluation with stable disease Cycle 11 nivolumab 09/05/2016 Cycle 12 nivolumab 09/19/2016 Cycle 13 nivolumab 10/03/2016 Cycle 14 nivolumab 10/17/2016 Cycle 15 nivolumab 10/31/2016 Cycle 16 nivolumab 11/14/2016 (changed to monthly schedule) Cycle 17 nivolumab 12/19/2016 CTs 01/21/2017-increased left renal mass, increased size of adrenal metastases, increased lytic bone lesions, increased left lung nodule, persistent tumor at L4 with probable epidural component Initiation of Cabozantinib 01/28/2017 Restaging CTs 05/30/2017- decreased size of left hilar mass, left renal mass, retroperitoneal adenopathy, and adrenal metastasis.  Healing bone lesions. Cabozantinib continued CTs 10/21/2017- interval enlargement left hilar lymph node; stable rib lesions; stable mass left renal cortex; stable mildly nodular adrenal glands; stable lytic  lesions within the pelvis and spine. Cabozantinib continued CTs 02/21/2018- enlargement of an AP window lymph node.  Mildly enlarged left  hilar lymph node is unchanged.  Primary renal cell carcinoma involving the upper pole of the left kidney appears similar.  Stable enlarged right periaortic lymph node adjacent to the renal vessels.  Stable multifocal bony metastatic disease. Cabozantinib continued CTs 07/29/2018- stable 15 mm AP window nodes; stable left hilar node; subcarinal node slightly larger; right periaortic node 16 mm, previously 12 mm; portacaval node 24 mm, previously 16 mm; 15 mm node superior to the pancreatic head has enlarged; interval increase nodularity of both adrenal glands; stable left kidney mass; multifocal bony metastatic disease not significantly changed. Cabozantinib continued CTs 11/06/2018- moderate improvement in thoracic adenopathy; minimal improvement abdominal adenopathy; left kidney upper pole mass and various lytic expansile bone lesions stable; mild increase in nodularity of left adrenal gland; right adrenal gland nodularity stable. 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  Peripheral enhancing masses consistent with metastases. No hemorrhage. MRI brain 03/11/2019-7 enhancing brain masses consistent with metastatic disease SRS to 7 brain lesions, treatment given 03/19/2019, 03/23/2019, and 03/25/2019 CTs 04/09/2019-decrease in left renal mass, bilateral adrenal nodules, AP window and porta hepatic adenopathy.  New 9 mm right lower lobe nodule.  Improved lytic lesion of the left acetabulum.  Other bone lesions are stable. Cabozantinib continued MRI brain 07/17/2019-resolution of 4 mm treated lesion in the right frontal cortex, 6 remaining treated lesions have decreased in size, new punctate metastasis in the superior right cerebellum SRS to right cerebellar lesion  07/30/2019 CTs 08/17/2019-previously noted right lower lobe nodule resolved, stable left kidney mass, mixed lytic/sclerotic bone lesions in the thoracolumbar spine, right posterior ribs, and left acetabulum-unchanged, no evidence of progressive disease  Cabozantinib continued MRI brain 10/23/2019-stable to slight decrease in size of multiple enhancing intracranial lesions.  Slight increase in surrounding edema in the medial left frontal lobe.  No new lesions present. MRI brain 01/29/2020-multiple enhancing brain lesions, some hemorrhagic, some with mild enlargement-treatment effect? CTs 02/08/2020-no thoracic metastases, enlargement of the left renal mass, enlargement of lytic lesion at T9, other lytic lesions unchanged MRI brain 05/04/2020-no new lesions, slight enlargement of a right occipital lesion, other lesions are stable or decreased in size CTs 08/11/2020-enlargement of left kidney mass, new 1.6 cm segment 7 liver lesion, enlargement of a lytic lesion at T9, increased lysis of a sclerotic lesion at the right second rib, 0.7 cm endoluminal nodule at the bladder dome-enlarged Brain MRI 08/10/2020-slight increase in a 15 mm left frontal lobe lesion, new punctate focus in the right occipital cortex, other lesions stable or slightly decreased CTs 11/01/2020-increase in left suprahilar opacity, enlargement of previous liver metastases, several new subcentimeter lesions, increase in left renal mass, similar appearance of bone metastases, stable hyperdense/enhancing nodule in the left bladder Brain MRI 11/04/2020-no change in brain metastases Lenvatinib/pembrolizumab 11/30/2020 CTs 03/03/2021-stable left hilar lymph nodes; resolution of left upper lobe perihilar nodularity; unchanged for millimeter peripheral right upper lobe nodule; multiple lytic bone lesions appear stable; left kidney lesion decreased in size; bilateral adrenal nodules decreased in size; several liver lesion showed mild increase in size; several new  small lesions within the right hepatic lobe. Lenvatinib/pembrolizumab continued 03/07/2021 Brain MRI 03/10/2021-progression of a left frontal metastasis, tiny new metastasis in the right frontal lobe-referred for SBRT to the right frontal lesion and to [facility] to consider a LITT procedure for the progressive left frontal lesion SRS 04/06/2021, LITT procedure at [facility] 04/18/2021-pathology metastatic renal cell carcinoma in background of necrosis/gliosis Lenvatinib on hold  Pembrolizumab held 04/24/2021 Pembrolizumab 05/01/2021, lenvatinib remains on hold pending surgical evaluation Pembrolizumab 05/25/2021, lenvatinib resumed Pembrolizumab 06/15/2021 Lenvima resumed 06/21/2021 CTs 07/28/2021-continued regression left renal lesion.  Much improved appearance of the liver.  Continued regression of bilateral adrenal gland nodules.  Stable mixed lytic and sclerotic metastatic bone disease.  No new or progressive findings. Pembrolizumab 07/31/2021, lenvatinib continued Pembrolizumab held 08/21/2021 due to generalized weakness Pembrolizumab resumed 09/11/2021 Pain secondary to #1-managed by Dr. Lovenia Shuck.  Improved Hypertension Elevated transaminases 02/06/2016- Pazopanib placed on hold Liver enzymes normal 03/07/2016 Port-A-Cath placement 05/16/2016 Malaise/anorexia 09/05/2016. Cortisol and testosterone levels low. Hydrocortisone and testosterone replacement initiated. Conjunctival/scleral erythema 09/19/2016-resolved with steroid eyedrops Proximal right leg weakness. 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 Hypercalcemia status post Zometa 01/23/2017-resolved Pruritic rash following IV contrast 10/21/2017; rash following IV contrast 02/21/2018 despite prednisone/Benadryl premedication Brief episodes of expressive aphasia October and December 2020 Vitamin B12 deficiency confirmed on lab 03/07/2021-started oral vitamin B12 replacement 03/28/2021      Disposition: Phillip [name]  appears unchanged.  There is no clinical evidence of disease progression.  Plan to continue lenvatinib and Pembrolizumab.  Restaging CTs prior to next office visit.  CBC and chemistry panel reviewed.  Labs adequate to proceed as above.  He will return for follow-up in 3 weeks.    [name] ANP/GNP-BC   10/23/2021  1:45 PM

## 2021-10-23 NOTE — Progress Notes (Signed)
Patient seen by Lisa Thomas NP today  Vitals are within treatment parameters.  Labs reviewed by Lisa Thomas NP and are within treatment parameters.  Per physician team, patient is ready for treatment and there are NO modifications to the treatment plan.     

## 2021-10-23 NOTE — Patient Instructions (Signed)
[facility]   Discharge Instructions: Thank you for choosing [facility] to provide your oncology and hematology care.   If you have a lab appointment with the Cancer Center, please go directly to the Cancer Center and check in at the registration area.   Wear comfortable clothing and clothing appropriate for easy access to any Portacath or PICC line.   We strive to give you quality time with your provider. You may need to reschedule your appointment if you arrive late (15 or more minutes).  Arriving late affects you and other patients whose appointments are after yours.  Also, if you miss three or more appointments without notifying the office, you may be dismissed from the clinic at the provider's discretion.      For prescription refill requests, have your pharmacy contact our office and allow 72 hours for refills to be completed.    Today you received the following chemotherapy and/or immunotherapy agents Pembrolizumab (KEYTRUDA).      To help prevent nausea and vomiting after your treatment, we encourage you to take your nausea medication as directed.  BELOW ARE SYMPTOMS THAT SHOULD BE REPORTED IMMEDIATELY: *FEVER GREATER THAN 100.4 F (38 C) OR HIGHER *CHILLS OR SWEATING *NAUSEA AND VOMITING THAT IS NOT CONTROLLED WITH YOUR NAUSEA MEDICATION *UNUSUAL SHORTNESS OF BREATH *UNUSUAL BRUISING OR BLEEDING *URINARY PROBLEMS (pain or burning when urinating, or frequent urination) *BOWEL PROBLEMS (unusual diarrhea, constipation, pain near the anus) TENDERNESS IN MOUTH AND THROAT WITH OR WITHOUT PRESENCE OF ULCERS (sore throat, sores in mouth, or a toothache) UNUSUAL RASH, SWELLING OR PAIN  UNUSUAL VAGINAL DISCHARGE OR ITCHING   Items with * indicate a potential emergency and should be followed up as soon as possible or go to the Emergency Department if any problems should occur.  Please show the CHEMOTHERAPY ALERT CARD or IMMUNOTHERAPY ALERT CARD  at check-in to the Emergency Department and triage nurse.  Should you have questions after your visit or need to cancel or reschedule your appointment, please contact [facility]  Dept: [phone]  and follow the prompts.  Office hours are 8:00 a.m. to 4:30 p.m. Monday - Friday. Please note that voicemails left after 4:00 p.m. may not be returned until the following business day.  We are closed weekends and major holidays. You have access to a nurse at all times for urgent questions. Please call the main number to the clinic Dept: [phone] and follow the prompts.   For any non-urgent questions, you may also contact your provider using MyChart. We now offer e-Visits for anyone 18 and older to request care online for non-urgent symptoms. For details visit [email or web address].   Also download the MyChart app! Go to the app store, search "MyChart", open the app, select [facility], and log in with your MyChart username and password.  Due to Covid, a mask is required upon entering the hospital/clinic. If you do not have a mask, one will be given to you upon arrival. For doctor visits, patients may have 1 support person aged 18 or older with them. For treatment visits, patients cannot have anyone with them due to current Covid guidelines and our immunocompromised population.   Pembrolizumab injection What is this medication? PEMBROLIZUMAB (pem broe liz ue mab) is a monoclonal antibody. It is used to treat certain types of cancer. This medicine may be used for other purposes; ask your health care provider or pharmacist if you have questions. COMMON BRAND NAME(S):   Keytruda What should I tell my care team before I take this medication? They need to know if you have any of these conditions: autoimmune diseases like Crohn's disease, ulcerative colitis, or lupus have had or planning to have an allogeneic stem cell transplant (uses someone else's stem cells) history of  organ transplant history of chest radiation nervous system problems like myasthenia gravis or Guillain-Barre syndrome an unusual or allergic reaction to pembrolizumab, other medicines, foods, dyes, or preservatives pregnant or trying to get pregnant breast-feeding How should I use this medication? This medicine is for infusion into a vein. It is given by a health care professional in a hospital or clinic setting. A special MedGuide will be given to you before each treatment. Be sure to read this information carefully each time. Talk to your pediatrician regarding the use of this medicine in children. While this drug may be prescribed for children as young as 6 months for selected conditions, precautions do apply. Overdosage: If you think you have taken too much of this medicine contact a poison control center or emergency room at once. NOTE: This medicine is only for you. Do not share this medicine with others. What if I miss a dose? It is important not to miss your dose. Call your doctor or health care professional if you are unable to keep an appointment. What may interact with this medication? Interactions have not been studied. This list may not describe all possible interactions. Give your health care provider a list of all the medicines, herbs, non-prescription drugs, or dietary supplements you use. Also tell them if you smoke, drink alcohol, or use illegal drugs. Some items may interact with your medicine. What should I watch for while using this medication? Your condition will be monitored carefully while you are receiving this medicine. You may need blood work done while you are taking this medicine. Do not become pregnant while taking this medicine or for 4 months after stopping it. Women should inform their doctor if they wish to become pregnant or think they might be pregnant. There is a potential for serious side effects to an unborn child. Talk to your health care professional or  pharmacist for more information. Do not breast-feed an infant while taking this medicine or for 4 months after the last dose. What side effects may I notice from receiving this medication? Side effects that you should report to your doctor or health care professional as soon as possible: allergic reactions like skin rash, itching or hives, swelling of the face, lips, or tongue bloody or black, tarry breathing problems changes in vision chest pain chills confusion constipation cough diarrhea dizziness or feeling faint or lightheaded fast or irregular heartbeat fever flushing joint pain low blood counts - this medicine may decrease the number of white blood cells, red blood cells and platelets. You may be at increased risk for infections and bleeding. muscle pain muscle weakness pain, tingling, numbness in the hands or feet persistent headache redness, blistering, peeling or loosening of the skin, including inside the mouth signs and symptoms of high blood sugar such as dizziness; dry mouth; dry skin; fruity breath; nausea; stomach pain; increased hunger or thirst; increased urination signs and symptoms of kidney injury like trouble passing urine or change in the amount of urine signs and symptoms of liver injury like dark urine, light-colored stools, loss of appetite, nausea, right upper belly pain, yellowing of the eyes or skin sweating swollen lymph nodes weight loss Side effects that usually do not   require medical attention (report to your doctor or health care professional if they continue or are bothersome): decreased appetite hair loss tiredness This list may not describe all possible side effects. Call your doctor for medical advice about side effects. You may report side effects to FDA at 1-800-FDA-1088. Where should I keep my medication? This drug is given in a hospital or clinic and will not be stored at home. NOTE: This sheet is a summary. It may not cover all possible  information. If you have questions about this medicine, talk to your doctor, pharmacist, or health care provider.  2023 Elsevier/Gold Standard (2021-01-13 00:00:00)  

## 2021-10-24 ENCOUNTER — Other Ambulatory Visit (HOSPITAL_COMMUNITY): Payer: Self-pay

## 2021-10-24 ENCOUNTER — Telehealth: Payer: Self-pay | Admitting: Internal Medicine

## 2021-10-24 ENCOUNTER — Inpatient Hospital Stay (HOSPITAL_BASED_OUTPATIENT_CLINIC_OR_DEPARTMENT_OTHER): Payer: PPO | Admitting: Internal Medicine

## 2021-10-24 ENCOUNTER — Other Ambulatory Visit: Payer: Self-pay

## 2021-10-24 ENCOUNTER — Telehealth: Payer: Self-pay

## 2021-10-24 VITALS — BP 160/69 | HR 47 | Temp 97.7°F | Resp 17 | Wt 249.4 lb

## 2021-10-24 DIAGNOSIS — R569 Unspecified convulsions: Secondary | ICD-10-CM | POA: Diagnosis not present

## 2021-10-24 DIAGNOSIS — C7931 Secondary malignant neoplasm of brain: Secondary | ICD-10-CM | POA: Diagnosis not present

## 2021-10-24 DIAGNOSIS — Z5112 Encounter for antineoplastic immunotherapy: Secondary | ICD-10-CM | POA: Diagnosis not present

## 2021-10-24 LAB — T4: T4, Total: 5.5 ug/dL (ref 4.5–12.0)

## 2021-10-24 MED ORDER — SERTRALINE HCL 25 MG PO TABS
25.0000 mg | ORAL_TABLET | Freq: Every day | ORAL | 1 refills | Status: DC
Start: 2021-10-24 — End: 2021-11-16
  Filled 2021-10-24: qty 60, 60d supply, fill #0

## 2021-10-24 NOTE — Telephone Encounter (Signed)
Per 8/29 los called and left message for pt about appointment

## 2021-10-24 NOTE — Progress Notes (Signed)
[facility] [place], [place] [address] [phone]   Interval Evaluation  Date of Service: 10/24/21 Patient Name: Phillip [name] Patient MRN: [id number] Patient DOB: December 22, 1945 Provider: [name], MD  Identifying Statement:  Phillip [name] is a 76 y.o. 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  No new or progressive neurologic complaints today, though does complain of increased overall fatigue, lethargy.  He continues on [place] and levatinib with Dr. [name] as prior.  Denies headaches and seizures. Remains active at home and at church.   Medications: Current Outpatient Medications on File Prior to Visit  Medication Sig Dispense Refill   acetaminophen (TYLENOL) 650 MG CR tablet Take 650 mg by mouth every 8 (eight) hours as needed for pain.     Cyanocobalamin (VITAMIN B12) 1000 MCG TBCR 1 tablet     docusate sodium (COLACE) 100 MG capsule Take 100 mg by mouth 2 (two) times daily.     famotidine (PEPCID AC) 10 MG tablet $RemoveB'20mg'kmrJbJIv$      furosemide (LASIX) 20 MG tablet Take 1 tablet by mouth daily. Call office with update on leg edema after taking for 1 week 30 tablet 0   hydrocortisone  (CORTEF) 10 MG tablet TAKE 2 TABLETS BY MOUTH IN THE MORNING AND 1 TABLET IN THE EVENING 90 tablet 3   ibuprofen (ADVIL,MOTRIN) 200 MG tablet Take 200 mg by mouth every 8 (eight) hours as needed for fever, headache, mild pain, moderate pain or cramping.      latanoprost (XALATAN) 0.005 % ophthalmic solution Instill 1 drop into both eyes every night 7.5 mL 12   lenvatinib 18 mg daily dose (LENVIMA, 18 MG DAILY DOSE,) 10 MG & 2 x 4 MG capsule Take 18 mg by mouth daily. 90 capsule 0   levETIRAcetam (KEPPRA) 250 MG tablet Take 250 mg by mouth 2 (two) times daily.     levETIRAcetam (KEPPRA) 500 MG tablet TAKE 1 TABLET BY MOUTH 2 TIMES DAILY 60 tablet 2   levobunolol (BETAGAN) 0.5 % ophthalmic solution Place 1 drop into both eyes daily 5 mL PRN   lidocaine-prilocaine (EMLA) cream APPLY TO PORTACATH 1 HOUR PRIOR TO USE AS NEEDED 30 g 2   LORazepam (ATIVAN) 0.5 MG tablet Take 1-2 tablets (0.5-1 mg total) by mouth every 6 (six) hours as needed for anxiety. 60 tablet 2   losartan-hydrochlorothiazide (HYZAAR) 100-12.5 MG tablet Take 1 tablet by mouth once a day. 90 tablet 3   mirtazapine (REMERON) 30 MG tablet TAKE 1 TABLET BY MOUTH EVERY EVENING AT BEDTIME 90 tablet 5   Multiple Vitamin (MULTIVITAMIN ADULT PO) Take 1 tablet by  mouth daily. [place] product     ondansetron (ZOFRAN) 8 MG tablet TAKE 1 TABLET BY MOUTH EVERY 8 HOURS AS NEEDED FOR NAUSEA AND VOMITING 90 tablet 2   oxyCODONE (OXY IR/ROXICODONE) 5 MG immediate release tablet Take 1 tablet by mouth every 6 hours as needed for severe pain. 60 tablet 0   oxyCODONE (OXYCONTIN) 10 mg 12 hr tablet TAKE 2 TABLETS BY MOUTH EVERY 12 HOURS. 120 tablet 0   pembrolizumab (KEYTRUDA) 100 MG/4ML SOLN See admin instructions.     polyethylene glycol (MIRALAX / GLYCOLAX) 17 g packet Take 17 g by mouth daily.     predniSONE (DELTASONE) 50 MG tablet Take 1  tablet by mouth 13 hours, 7 hours, and 1 hour prior to CT scan. 3 tablet 0   prochlorperazine (COMPAZINE) 10 MG  tablet Take 1 tablet by mouth every 6 hours as needed for nausea or vomiting. 30 tablet 2   sulfamethoxazole-trimethoprim (BACTRIM DS) 800-160 MG tablet Take 1 tablet by mouth Twice a day for 10 day(s) 20 tablet 0   Testosterone 1.62 % GEL Apply 2 pumps topically daily 75 g 3   vitamin B-12 (CYANOCOBALAMIN) 1000 MCG tablet Take 1 tablet (1,000 mcg total) by mouth daily. Take 1 tablet twice daily x 3 days, then daily     Current Facility-Administered Medications on File Prior to Visit  Medication Dose Route Frequency Provider Last Rate Last Admin   sodium chloride flush (NS) 0.9 % injection 10 mL  10 mL Intravenous PRN [name], MD   10 mL at 06/22/16 0921   sodium chloride flush (NS) 0.9 % injection 10 mL  10 mL Intravenous PRN Owens Shark, NP   10 mL at 08/21/21 1325    Allergies:  Allergies  Allergen Reactions   Contrast Media [Iodinated Contrast Media] Rash   Past Medical History:  Past Medical History:  Diagnosis Date   Anxiety    Brain cancer ([place])    Chronic kidney disease    renal cancer   Constipation    Depression    Hypertension    Low testosterone in male    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   Seizures ([place])    last seizure 01/2019 - controlled since on keppra   Wears glasses    Past Surgical History:  Past Surgical History:  Procedure Laterality Date   insertion port-a-cath  2018   IR GENERIC HISTORICAL  11/18/2015   IR FLUORO GUIDED NEEDLE PLC ASPIRATION/INJECTION LOC 11/18/2015 [name], MD [facility]-INTERV RAD   IR GENERIC HISTORICAL  05/16/2016   IR FLUORO GUIDE PORT INSERTION RIGHT 05/16/2016 [name], MD [facility]-INTERV RAD   IR GENERIC HISTORICAL  05/16/2016   IR US GUIDE VASC ACCESS RIGHT 05/16/2016 [name], MD [facility]-INTERV RAD   RADIOLOGY WITH ANESTHESIA N/A 06/16/2019   Procedure: MRI BRAIN WITH AND WITHOUT CONTRAST WITH ANESTHESIA;  Surgeon: Radiologist, Medication, MD;  Location: [facility];  Service: Radiology;  Laterality: N/A;    Social History:  Social History   Socioeconomic History   Marital status: Single    Spouse name: Not on file   Number of children: Not on file   Years of education: Not on file   Highest education level: Not on file  Occupational History   Not on file  Tobacco Use   Smoking status: Never   Smokeless tobacco: Never  Vaping Use   Vaping Use: Never used  Substance and Sexual Activity   Alcohol use:  Not Currently    Alcohol/week: 1.0 standard drink of alcohol    Types: 1 Glasses of wine per week   Drug use: Yes    Types: Marijuana    Comment: Last use 06/13/19   Sexual activity: Not Currently  Other Topics Concern   Not on file  Social History Narrative   Not on file   Social Determinants of Health   Financial Resource Strain: Not on file  Food Insecurity: Not on file  Transportation Needs: Not on file  Physical Activity: Not on file  Stress: Not on file  Social Connections: Not on file  Intimate Partner Violence: Not on file   Family History:  Family History  Problem Relation Age of Onset   Cancer Grandchild        lymphoma    Review of Systems: Constitutional: Doesn't report fevers, chills or abnormal weight loss Eyes: Doesn't report blurriness of vision Ears, nose, mouth, throat, and face: Doesn't report sore throat Respiratory: Doesn't report cough, dyspnea or wheezes Cardiovascular: Doesn't report palpitation, chest discomfort  Gastrointestinal:  Doesn't report nausea, constipation, diarrhea GU: Doesn't report incontinence Skin: Doesn't report skin rashes Neurological: Per HPI Musculoskeletal: Doesn't report joint pain Behavioral/Psych: Doesn't report anxiety  Physical Exam: Vitals:   10/24/21 1145  BP: (!) 160/69  Pulse: (!) 47  Resp: 17  Temp: 97.7 F (36.5 C)  SpO2: 96%   KPS: 90. General: Alert, cooperative, pleasant, in no acute distress Head: Normal EENT: No conjunctival injection or scleral icterus.  Lungs: Resp effort  normal Cardiac: Regular rate Abdomen: Non-distended abdomen Skin: No rashes cyanosis or petechiae. Extremities: No clubbing or edema  Neurologic Exam: Mental Status: Awake, alert, attentive to examiner. Oriented to self and environment. Language is fluent with intact comprehension.  Cranial Nerves: Visual acuity is grossly normal. Visual fields are full. Extra-ocular movements intact. No ptosis. Face is symmetric Motor: Tone and bulk are normal. Power is full in both arms and legs. Reflexes are symmetric, no pathologic reflexes present.  Sensory: Intact to light touch Gait: Normal.   Labs: I have reviewed the data as listed    Component Value Date/Time   NA 137 10/23/2021 1059   NA 140 02/21/2017 1128   K 4.5 10/23/2021 1059   K 4.3 02/21/2017 1128   CL 101 10/23/2021 1059   CO2 26 10/23/2021 1059   CO2 26 02/21/2017 1128   GLUCOSE 157 (H) 10/23/2021 1059   GLUCOSE 115 02/21/2017 1128   BUN 24 (H) 10/23/2021 1059   BUN 16.6 02/21/2017 1128   CREATININE 1.08 10/23/2021 1059   CREATININE 0.8 02/21/2017 1128   CALCIUM 8.8 (L) 10/23/2021 1059   CALCIUM 9.4 02/21/2017 1128   PROT 6.4 (L) 10/23/2021 1059   PROT 7.5 02/21/2017 1128   ALBUMIN 3.8 10/23/2021 1059   ALBUMIN 4.2 02/21/2017 1128   AST 14 (L) 10/23/2021 1059   AST 20 02/21/2017 1128   ALT 11 10/23/2021 1059   ALT 43 02/21/2017 1128   ALKPHOS 61 10/23/2021 1059   ALKPHOS 104 02/21/2017 1128   BILITOT 0.4 10/23/2021 1059   BILITOT 0.36 02/21/2017 1128   GFRNONAA >60 10/23/2021 1059   GFRAA >60 11/05/2019 0957   Lab Results  Component Value Date   WBC 6.7 10/23/2021   NEUTROABS 3.5 10/23/2021   HGB 11.3 (L) 10/23/2021   HCT 36.0 (L) 10/23/2021   MCV 83.9 10/23/2021   PLT 198 10/23/2021    Case Report  [facility] Surgical Pathology Report  Case: [id number]                                Authorizing Provider:  Bartholomew Boards, MD Collected:           04/18/2021 08:33 AM          Ordering  Location:     Surgical Services - 1st fl Received:            04/18/2021 08:39 AM                                 [facility]                                                                  Pathologist:           Romero Liner, MD                                                        Intraop:               Romero Liner, MD                                                        Specimen:    Brain Biopsy, LEFT FRONTAL LESION:[id number]                                        Final Pathologic Diagnosis     A.  BRAIN LESION, LEFT FRONTAL, BIOPSY:              Sparse malignant cells, consistent with a metastatic renal cell carcinoma. Predominant background of necrosis, gliosis, and calcifications. See comment.    Electronically signed by Romero Liner, MD on 04/24/2021 at  5:10 PM  Comment    Immunoperoxidase studies are performed.  The tumor cells are immunoreactive for Pax8, CA9, and CD10.  The tumor cells are negative for GFAP, EMA, cytokeratin AE1/AE3, cytokeratin 7, cytokeratin 20, cytokeratin 5/6, INSM1, and TTF-1.  The findings are consistent with a metastatic poorly-differentiated carcinoma originating from the kidney.  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  The immunohistochemical controls worked appropriately.      Imaging:  [place] Clinician Interpretation: I have personally reviewed the CNS images as listed.  My interpretation, in the context of the patient's clinical presentation, is likely treatment effect  MR BRAIN W WO CONTRAST  Result Date: 10/20/2021 CLINICAL DATA:  Brain/CNS neoplasm, assess treatment response EXAM: MRI HEAD WITHOUT AND WITH CONTRAST TECHNIQUE: Multiplanar, multiecho pulse sequences of the brain and surrounding structures were obtained without and with intravenous contrast. CONTRAST:  6mL MULTIHANCE GADOBENATE  DIMEGLUMINE 529 MG/ML IV SOLN COMPARISON:  07/14/2021 FINDINGS: Brain: New  minimal and somewhat curvilinear enhancement in the right frontal lobe (series 13, image 138). Corresponding T2 hyperintensity is noted. Increased, still punctate right cerebellar lesion (image 48). Remainder of lesions on the prior study are not substantially changed. Stable areas of T2 FLAIR hyperintensity no mass effect or hydrocephalus. No extra-axial collection. 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 Vascular: Major vessel flow voids at the skull base are preserved. Skull and upper cervical spine: Normal marrow signal is preserved. Sinuses/Orbits: Paranasal sinuses are aerated. Orbits are unremarkable. Other: Sella is unremarkable.  Mastoid air cells are clear. IMPRESSION: New focus of minimal enhancement in the right frontal lobe. Recommend attention on follow-up. Larger punctate right cerebellar lesion. Remainder of lesions are unchanged. Electronically Signed   By: Macy Mis M.D.   On: 10/20/2021 09:43    Assessment/Plan Metastasis to brain [facility]) [C79.31]  Phillip [name] is clinically stable today.  MRI brain demonstrates apparently new focus of enhancement within right frontal lobe.  This lesion was visible, however, in January 2021 and did receive 20gy SRS dose at that time.  Prior to this current scan no post-treatment enhancement was apparent.    We will recommend continued close imaging surveillance only, per discussion in brain/spine tumor board.  Ongoing cognitive impairment is likely secondary to high burden of CNS disease, radiation therapy, leukomalacia, parenchymal atrophy.  He should continue Keppra $RemoveBeforeD'500mg'DAMKmSAbQhhoIW$  BID for seizure prevention.    Will switch anti-depressant from Remeron to Sertraline $RemoveBefor'25mg'GRkeJNJYrQAs$  daily due to excessive fatigue, high sleep volume.  We appreciate the opportunity to participate in the care of [place].    We ask that [place] return to clinic in 3 months following next brain MRI, or sooner as needed.  All questions were answered.  The patient knows to call the clinic with any problems, questions or concerns. No barriers to learning were detected.  I have spent a total of 30 minutes of face-to-face and non-face-to-face time, excluding clinical staff time, preparing to see patient, ordering tests and/or medications, counseling the patient, and independently interpreting results and communicating results to the patient/family/caregiver    [name], MD Medical Director of Neuro-Oncology [facility] at [place] 10/24/21 11:36 AM

## 2021-10-24 NOTE — Telephone Encounter (Signed)
Patient's wife gave verbal understanding and had no further questions or concerns

## 2021-10-24 NOTE — Telephone Encounter (Signed)
-----   Message from Owens Shark, NP sent at 10/24/2021  8:38 AM EDT ----- Please let his wife know additional thyroid tests were within normal range.

## 2021-10-25 ENCOUNTER — Other Ambulatory Visit: Payer: Self-pay

## 2021-10-25 ENCOUNTER — Other Ambulatory Visit: Payer: Self-pay | Admitting: Radiation Therapy

## 2021-10-26 ENCOUNTER — Other Ambulatory Visit (HOSPITAL_COMMUNITY): Payer: Self-pay

## 2021-10-27 ENCOUNTER — Other Ambulatory Visit: Payer: Self-pay

## 2021-10-27 ENCOUNTER — Other Ambulatory Visit (HOSPITAL_COMMUNITY): Payer: Self-pay

## 2021-10-27 MED ORDER — DORZOLAMIDE HCL 2 % OP SOLN
OPHTHALMIC | 3 refills | Status: DC
Start: 2021-10-27 — End: 2022-05-22
  Filled 2021-10-27: qty 10, 55d supply, fill #0

## 2021-10-30 ENCOUNTER — Other Ambulatory Visit: Payer: Self-pay | Admitting: Nurse Practitioner

## 2021-10-30 ENCOUNTER — Other Ambulatory Visit: Payer: Self-pay | Admitting: Oncology

## 2021-10-30 DIAGNOSIS — C649 Malignant neoplasm of unspecified kidney, except renal pelvis: Secondary | ICD-10-CM

## 2021-10-31 ENCOUNTER — Other Ambulatory Visit (HOSPITAL_COMMUNITY): Payer: Self-pay

## 2021-10-31 ENCOUNTER — Other Ambulatory Visit: Payer: Self-pay | Admitting: Oncology

## 2021-10-31 ENCOUNTER — Other Ambulatory Visit: Payer: Self-pay | Admitting: Nurse Practitioner

## 2021-10-31 DIAGNOSIS — C649 Malignant neoplasm of unspecified kidney, except renal pelvis: Secondary | ICD-10-CM

## 2021-10-31 MED ORDER — LENVIMA (18 MG DAILY DOSE) 10 MG & 2 X 4 MG PO CPPK
18.0000 mg | ORAL_CAPSULE | Freq: Every day | ORAL | 0 refills | Status: DC
  Filled 2021-10-31 – 2021-11-02 (×2): qty 90, 30d supply, fill #0

## 2021-11-01 ENCOUNTER — Other Ambulatory Visit (HOSPITAL_COMMUNITY): Payer: Self-pay

## 2021-11-01 ENCOUNTER — Other Ambulatory Visit: Payer: Self-pay | Admitting: Nurse Practitioner

## 2021-11-01 DIAGNOSIS — C649 Malignant neoplasm of unspecified kidney, except renal pelvis: Secondary | ICD-10-CM

## 2021-11-01 MED ORDER — OXYCODONE HCL 5 MG PO TABS
5.0000 mg | ORAL_TABLET | Freq: Four times a day (QID) | ORAL | 0 refills | Status: DC | PRN
  Filled 2021-11-01: qty 60, 15d supply, fill #0

## 2021-11-01 MED ORDER — OXYCODONE HCL ER 10 MG PO T12A
EXTENDED_RELEASE_TABLET | Freq: Two times a day (BID) | ORAL | 0 refills | Status: DC
  Filled 2021-11-01: qty 120, 30d supply, fill #0

## 2021-11-02 ENCOUNTER — Other Ambulatory Visit (HOSPITAL_COMMUNITY): Payer: Self-pay

## 2021-11-02 ENCOUNTER — Encounter: Payer: Self-pay | Admitting: Oncology

## 2021-11-02 ENCOUNTER — Telehealth: Payer: Self-pay | Admitting: Pharmacy Technician

## 2021-11-02 NOTE — Telephone Encounter (Signed)
Oral Oncology Patient Advocate Encounter  Was successful in securing patient a $10,000 grant from [employer] to provide copayment coverage for [place].  This will keep the out of pocket expense at $0.     [employer] ID: [id number]  I have spoken with the patient.   The billing information is as follows and has been shared with [facility].    RxBin: [id number] PCN: [id number] Member ID: [id number] Group ID: [id number] Dates of Eligibility: 11/11/2021 through 11/11/2022  Fund:  Renal Cell  Phillip [name], CPhT-Adv Oncology Pharmacy Patient [place] Direct Number: [phone]  Fax: [phone]

## 2021-11-03 ENCOUNTER — Other Ambulatory Visit (HOSPITAL_COMMUNITY): Payer: Self-pay

## 2021-11-03 ENCOUNTER — Other Ambulatory Visit: Payer: Self-pay | Admitting: *Deleted

## 2021-11-03 ENCOUNTER — Encounter: Payer: Self-pay | Admitting: Oncology

## 2021-11-03 DIAGNOSIS — C649 Malignant neoplasm of unspecified kidney, except renal pelvis: Secondary | ICD-10-CM

## 2021-11-03 MED ORDER — ONDANSETRON HCL 8 MG PO TABS
ORAL_TABLET | ORAL | 2 refills | Status: DC
  Filled 2021-11-03: qty 90, 30d supply, fill #0
  Filled 2021-12-06: qty 90, 30d supply, fill #1
  Filled 2021-12-29: qty 90, 30d supply, fill #2

## 2021-11-06 ENCOUNTER — Other Ambulatory Visit (HOSPITAL_COMMUNITY): Payer: Self-pay

## 2021-11-06 ENCOUNTER — Telehealth: Payer: Self-pay | Admitting: *Deleted

## 2021-11-06 ENCOUNTER — Encounter: Payer: Self-pay | Admitting: Oncology

## 2021-11-06 MED ORDER — PREDNISONE 50 MG PO TABS
ORAL_TABLET | ORAL | 0 refills | Status: DC
  Filled 2021-11-06: qty 3, 1d supply, fill #0

## 2021-11-06 NOTE — Telephone Encounter (Signed)
Called wife with CT appointment at [facility] on 9/15 at 10:00 am . Arrive a 0945. Sent his prednisone premed script to [facility]. He has contrast and will drink at 0800 and 0900. Scheduler notified to call him with port access at 0900.

## 2021-11-09 ENCOUNTER — Other Ambulatory Visit (HOSPITAL_COMMUNITY): Payer: Self-pay

## 2021-11-10 ENCOUNTER — Other Ambulatory Visit: Payer: Self-pay | Admitting: Oncology

## 2021-11-10 ENCOUNTER — Ambulatory Visit (HOSPITAL_BASED_OUTPATIENT_CLINIC_OR_DEPARTMENT_OTHER)
Admission: RE | Admit: 2021-11-10 | Discharge: 2021-11-10 | Disposition: A | Discharge status: Home / Self Care | Payer: PPO | Source: Ambulatory Visit | Attending: Nurse Practitioner | Admitting: Nurse Practitioner

## 2021-11-10 ENCOUNTER — Inpatient Hospital Stay: Payer: PPO | Attending: Nurse Practitioner

## 2021-11-10 DIAGNOSIS — C7931 Secondary malignant neoplasm of brain: Secondary | ICD-10-CM | POA: Diagnosis not present

## 2021-11-10 DIAGNOSIS — C7951 Secondary malignant neoplasm of bone: Secondary | ICD-10-CM | POA: Insufficient documentation

## 2021-11-10 DIAGNOSIS — Z95828 Presence of other vascular implants and grafts: Secondary | ICD-10-CM

## 2021-11-10 DIAGNOSIS — Z5112 Encounter for antineoplastic immunotherapy: Secondary | ICD-10-CM | POA: Insufficient documentation

## 2021-11-10 DIAGNOSIS — K8020 Calculus of gallbladder without cholecystitis without obstruction: Secondary | ICD-10-CM | POA: Diagnosis not present

## 2021-11-10 DIAGNOSIS — C649 Malignant neoplasm of unspecified kidney, except renal pelvis: Secondary | ICD-10-CM | POA: Insufficient documentation

## 2021-11-10 DIAGNOSIS — C642 Malignant neoplasm of left kidney, except renal pelvis: Secondary | ICD-10-CM | POA: Insufficient documentation

## 2021-11-10 DIAGNOSIS — R918 Other nonspecific abnormal finding of lung field: Secondary | ICD-10-CM | POA: Diagnosis not present

## 2021-11-10 DIAGNOSIS — E538 Deficiency of other specified B group vitamins: Secondary | ICD-10-CM | POA: Insufficient documentation

## 2021-11-10 DIAGNOSIS — C787 Secondary malignant neoplasm of liver and intrahepatic bile duct: Secondary | ICD-10-CM | POA: Insufficient documentation

## 2021-11-10 MED ORDER — SODIUM CHLORIDE 0.9% FLUSH
10.0000 mL | INTRAVENOUS | Status: AC | PRN
  Administered 2021-11-10: 10 mL via INTRAVENOUS

## 2021-11-10 MED ORDER — IOHEXOL 300 MG/ML  SOLN
100.0000 mL | Freq: Once | INTRAMUSCULAR | Status: AC | PRN
  Administered 2021-11-10: 100 mL via INTRAVENOUS

## 2021-11-10 MED ORDER — HEPARIN SOD (PORK) LOCK FLUSH 100 UNIT/ML IV SOLN
500.0000 [IU] | Freq: Once | INTRAVENOUS | Status: AC
  Administered 2021-11-10: 500 [IU] via INTRAVENOUS

## 2021-11-10 NOTE — Addendum Note (Signed)
Addended by: Doristine Section L on: 11/10/2021 10:06 AM   Modules accepted: Orders

## 2021-11-11 ENCOUNTER — Other Ambulatory Visit (HOSPITAL_COMMUNITY): Payer: Self-pay

## 2021-11-13 ENCOUNTER — Inpatient Hospital Stay: Payer: PPO

## 2021-11-13 ENCOUNTER — Other Ambulatory Visit (HOSPITAL_COMMUNITY): Payer: Self-pay

## 2021-11-13 ENCOUNTER — Inpatient Hospital Stay (HOSPITAL_BASED_OUTPATIENT_CLINIC_OR_DEPARTMENT_OTHER): Payer: PPO | Admitting: Oncology

## 2021-11-13 VITALS — BP 144/68 | HR 65 | Temp 98.1°F | Resp 18 | Ht 71.0 in | Wt 250.0 lb

## 2021-11-13 DIAGNOSIS — C649 Malignant neoplasm of unspecified kidney, except renal pelvis: Secondary | ICD-10-CM

## 2021-11-13 DIAGNOSIS — C7931 Secondary malignant neoplasm of brain: Secondary | ICD-10-CM

## 2021-11-13 DIAGNOSIS — C642 Malignant neoplasm of left kidney, except renal pelvis: Secondary | ICD-10-CM

## 2021-11-13 DIAGNOSIS — C787 Secondary malignant neoplasm of liver and intrahepatic bile duct: Secondary | ICD-10-CM | POA: Diagnosis not present

## 2021-11-13 DIAGNOSIS — Z5112 Encounter for antineoplastic immunotherapy: Secondary | ICD-10-CM | POA: Diagnosis not present

## 2021-11-13 DIAGNOSIS — E538 Deficiency of other specified B group vitamins: Secondary | ICD-10-CM | POA: Diagnosis not present

## 2021-11-13 DIAGNOSIS — C7951 Secondary malignant neoplasm of bone: Secondary | ICD-10-CM | POA: Diagnosis not present

## 2021-11-13 LAB — CMP (CANCER CENTER ONLY)
ALT: 10 U/L (ref 0–44)
AST: 11 U/L — ABNORMAL LOW (ref 15–41)
Albumin: 3.9 g/dL (ref 3.5–5.0)
Alkaline Phosphatase: 53 U/L (ref 38–126)
Anion gap: 9 (ref 5–15)
BUN: 18 mg/dL (ref 8–23)
CO2: 30 mmol/L (ref 22–32)
Calcium: 9 mg/dL (ref 8.9–10.3)
Chloride: 95 mmol/L — ABNORMAL LOW (ref 98–111)
Creatinine: 0.86 mg/dL (ref 0.61–1.24)
GFR, Estimated: >60 mL/min (ref >60)
Glucose, Bld: 157 mg/dL — ABNORMAL HIGH (ref 70–99)
Potassium: 3.3 mmol/L — ABNORMAL LOW (ref 3.5–5.1)
Sodium: 134 mmol/L — ABNORMAL LOW (ref 135–145)
Total Bilirubin: 0.3 mg/dL (ref 0.3–1.2)
Total Protein: 6.4 g/dL — ABNORMAL LOW (ref 6.5–8.1)

## 2021-11-13 LAB — CBC WITH DIFFERENTIAL (CANCER CENTER ONLY)
Abs Immature Granulocytes: 0.04 10*3/uL (ref 0.00–0.07)
Basophils Absolute: 0.1 10*3/uL (ref 0.0–0.1)
Basophils Relative: 1 %
Eosinophils Absolute: 1 10*3/uL — ABNORMAL HIGH (ref 0.0–0.5)
Eosinophils Relative: 11 %
HCT: 36.5 % — ABNORMAL LOW (ref 39.0–52.0)
Hemoglobin: 11.4 g/dL — ABNORMAL LOW (ref 13.0–17.0)
Immature Granulocytes: 0 %
Lymphocytes Relative: 19 %
Lymphs Abs: 1.8 10*3/uL (ref 0.7–4.0)
MCH: 26.6 pg (ref 26.0–34.0)
MCHC: 31.2 g/dL (ref 30.0–36.0)
MCV: 85.1 fL (ref 80.0–100.0)
Monocytes Absolute: 1.1 10*3/uL — ABNORMAL HIGH (ref 0.1–1.0)
Monocytes Relative: 12 %
Neutro Abs: 5.5 10*3/uL (ref 1.7–7.7)
Neutrophils Relative %: 57 %
Platelet Count: 275 10*3/uL (ref 150–400)
RBC: 4.29 MIL/uL (ref 4.22–5.81)
RDW: 17.3 % — ABNORMAL HIGH (ref 11.5–15.5)
WBC Count: 9.4 10*3/uL (ref 4.0–10.5)
nRBC: 0 % (ref 0.0–0.2)

## 2021-11-13 LAB — TOTAL PROTEIN, URINE DIPSTICK: Protein, ur: 30 mg/dL — AB

## 2021-11-13 MED ORDER — HEPARIN SOD (PORK) LOCK FLUSH 100 UNIT/ML IV SOLN
500.0000 [IU] | Freq: Once | INTRAVENOUS | Status: AC | PRN
  Administered 2021-11-13: 500 [IU]

## 2021-11-13 MED ORDER — LIDOCAINE-PRILOCAINE 2.5-2.5 % EX CREA
TOPICAL_CREAM | CUTANEOUS | 2 refills | Status: DC
  Filled 2021-11-13: qty 30, 30d supply, fill #0

## 2021-11-13 MED ORDER — SODIUM CHLORIDE 0.9 % IV SOLN: Freq: Once | INTRAVENOUS | Status: AC

## 2021-11-13 MED ORDER — SODIUM CHLORIDE 0.9% FLUSH
10.0000 mL | INTRAVENOUS | Status: DC | PRN
  Administered 2021-11-13: 10 mL

## 2021-11-13 MED ORDER — SODIUM CHLORIDE 0.9 % IV SOLN
200.0000 mg | Freq: Once | INTRAVENOUS | Status: AC
  Administered 2021-11-13: 200 mg via INTRAVENOUS
  Filled 2021-11-13: qty 8

## 2021-11-13 NOTE — Progress Notes (Signed)
Patient seen by Dr. Sherrill today ? ?Vitals are within treatment parameters. ? ?Labs reviewed by Dr. Sherrill and are within treatment parameters. ? ?Per physician team, patient is ready for treatment and there are NO modifications to the treatment plan.  ?

## 2021-11-13 NOTE — Patient Instructions (Signed)
[facility]   Discharge Instructions: Thank you for choosing [facility] to provide your oncology and hematology care.   If you have a lab appointment with the Cancer Center, please go directly to the Cancer Center and check in at the registration area.   Wear comfortable clothing and clothing appropriate for easy access to any Portacath or PICC line.   We strive to give you quality time with your provider. You may need to reschedule your appointment if you arrive late (15 or more minutes).  Arriving late affects you and other patients whose appointments are after yours.  Also, if you miss three or more appointments without notifying the office, you may be dismissed from the clinic at the provider's discretion.      For prescription refill requests, have your pharmacy contact our office and allow 72 hours for refills to be completed.    Today you received the following chemotherapy and/or immunotherapy agents Pembrolizumab (KEYTRUDA).      To help prevent nausea and vomiting after your treatment, we encourage you to take your nausea medication as directed.  BELOW ARE SYMPTOMS THAT SHOULD BE REPORTED IMMEDIATELY: *FEVER GREATER THAN 100.4 F (38 C) OR HIGHER *CHILLS OR SWEATING *NAUSEA AND VOMITING THAT IS NOT CONTROLLED WITH YOUR NAUSEA MEDICATION *UNUSUAL SHORTNESS OF BREATH *UNUSUAL BRUISING OR BLEEDING *URINARY PROBLEMS (pain or burning when urinating, or frequent urination) *BOWEL PROBLEMS (unusual diarrhea, constipation, pain near the anus) TENDERNESS IN MOUTH AND THROAT WITH OR WITHOUT PRESENCE OF ULCERS (sore throat, sores in mouth, or a toothache) UNUSUAL RASH, SWELLING OR PAIN  UNUSUAL VAGINAL DISCHARGE OR ITCHING   Items with * indicate a potential emergency and should be followed up as soon as possible or go to the Emergency Department if any problems should occur.  Please show the CHEMOTHERAPY ALERT CARD or IMMUNOTHERAPY ALERT CARD  at check-in to the Emergency Department and triage nurse.  Should you have questions after your visit or need to cancel or reschedule your appointment, please contact [facility]  Dept: [phone]  and follow the prompts.  Office hours are 8:00 a.m. to 4:30 p.m. Monday - Friday. Please note that voicemails left after 4:00 p.m. may not be returned until the following business day.  We are closed weekends and major holidays. You have access to a nurse at all times for urgent questions. Please call the main number to the clinic Dept: [phone] and follow the prompts.   For any non-urgent questions, you may also contact your provider using MyChart. We now offer e-Visits for anyone 18 and older to request care online for non-urgent symptoms. For details visit [email or web address].   Also download the MyChart app! Go to the app store, search "MyChart", open the app, select [place], and log in with your MyChart username and password.  Due to Covid, a mask is required upon entering the hospital/clinic. If you do not have a mask, one will be given to you upon arrival. For doctor visits, patients may have 1 support person aged 18 or older with them. For treatment visits, patients cannot have anyone with them due to current Covid guidelines and our immunocompromised population.   Pembrolizumab injection What is this medication? PEMBROLIZUMAB (pem broe liz ue mab) is a monoclonal antibody. It is used to treat certain types of cancer. This medicine may be used for other purposes; ask your health care provider or pharmacist if you have questions. COMMON BRAND NAME(S):   Keytruda What should I tell my care team before I take this medication? They need to know if you have any of these conditions: autoimmune diseases like Crohn's disease, ulcerative colitis, or lupus have had or planning to have an allogeneic stem cell transplant (uses someone else's stem cells) history of  organ transplant history of chest radiation nervous system problems like myasthenia gravis or Guillain-Barre syndrome an unusual or allergic reaction to pembrolizumab, other medicines, foods, dyes, or preservatives pregnant or trying to get pregnant breast-feeding How should I use this medication? This medicine is for infusion into a vein. It is given by a health care professional in a hospital or clinic setting. A special MedGuide will be given to you before each treatment. Be sure to read this information carefully each time. Talk to your pediatrician regarding the use of this medicine in children. While this drug may be prescribed for children as young as 6 months for selected conditions, precautions do apply. Overdosage: If you think you have taken too much of this medicine contact a poison control center or emergency room at once. NOTE: This medicine is only for you. Do not share this medicine with others. What if I miss a dose? It is important not to miss your dose. Call your doctor or health care professional if you are unable to keep an appointment. What may interact with this medication? Interactions have not been studied. This list may not describe all possible interactions. Give your health care provider a list of all the medicines, herbs, non-prescription drugs, or dietary supplements you use. Also tell them if you smoke, drink alcohol, or use illegal drugs. Some items may interact with your medicine. What should I watch for while using this medication? Your condition will be monitored carefully while you are receiving this medicine. You may need blood work done while you are taking this medicine. Do not become pregnant while taking this medicine or for 4 months after stopping it. Women should inform their doctor if they wish to become pregnant or think they might be pregnant. There is a potential for serious side effects to an unborn child. Talk to your health care professional or  pharmacist for more information. Do not breast-feed an infant while taking this medicine or for 4 months after the last dose. What side effects may I notice from receiving this medication? Side effects that you should report to your doctor or health care professional as soon as possible: allergic reactions like skin rash, itching or hives, swelling of the face, lips, or tongue bloody or black, tarry breathing problems changes in vision chest pain chills confusion constipation cough diarrhea dizziness or feeling faint or lightheaded fast or irregular heartbeat fever flushing joint pain low blood counts - this medicine may decrease the number of white blood cells, red blood cells and platelets. You may be at increased risk for infections and bleeding. muscle pain muscle weakness pain, tingling, numbness in the hands or feet persistent headache redness, blistering, peeling or loosening of the skin, including inside the mouth signs and symptoms of high blood sugar such as dizziness; dry mouth; dry skin; fruity breath; nausea; stomach pain; increased hunger or thirst; increased urination signs and symptoms of kidney injury like trouble passing urine or change in the amount of urine signs and symptoms of liver injury like dark urine, light-colored stools, loss of appetite, nausea, right upper belly pain, yellowing of the eyes or skin sweating swollen lymph nodes weight loss Side effects that usually do not   require medical attention (report to your doctor or health care professional if they continue or are bothersome): decreased appetite hair loss tiredness This list may not describe all possible side effects. Call your doctor for medical advice about side effects. You may report side effects to FDA at 1-800-FDA-1088. Where should I keep my medication? This drug is given in a hospital or clinic and will not be stored at home. NOTE: This sheet is a summary. It may not cover all possible  information. If you have questions about this medicine, talk to your doctor, pharmacist, or health care provider.  2023 Elsevier/Gold Standard (2021-01-13 00:00:00)  

## 2021-11-13 NOTE — Progress Notes (Signed)
Phillip [name] OFFICE PROGRESS NOTE   Diagnosis: Renal cell carcinoma  INTERVAL HISTORY:   Phillip [name] returns as scheduled.  He continues treatment with lenvatinib and pembrolizumab.  No diarrhea.  Stable rash over the trunk.  His significant other reports he has increased right leg and back pain.  He is now taking oxycodone twice daily in addition to the OxyContin.  He has increased depression symptoms.  He is less active.  He has considered discontinuing treatment.  No seizures.  Objective:  Vital signs in last 24 hours:  Blood pressure (!) 144/68, pulse 65, temperature 98.1 F (36.7 C), temperature source Oral, resp. rate 18, height '5\' 11"'$  (1.803 m), weight 250 lb (113.4 kg), SpO2 96 %.    HEENT: No thrush or ulcers Resp: Lungs clear bilaterally Cardio: Regular rate and rhythm with premature beats GI: No hepatosplenomegaly Vascular: No leg edema Neuro: Alert, follows commands, ambulates to the exam table with assistance, unsteady gait Skin: Acne type rash over the chest  Portacath/PICC-without erythema  Lab Results:  Lab Results  Component Value Date   WBC 9.4 11/13/2021   HGB 11.4 (L) 11/13/2021   HCT 36.5 (L) 11/13/2021   MCV 85.1 11/13/2021   PLT 275 11/13/2021   NEUTROABS 5.5 11/13/2021    CMP  Lab Results  Component Value Date   NA 134 (L) 11/13/2021   K 3.3 (L) 11/13/2021   CL 95 (L) 11/13/2021   CO2 30 11/13/2021   GLUCOSE 157 (H) 11/13/2021   BUN 18 11/13/2021   CREATININE 0.86 11/13/2021   CALCIUM 9.0 11/13/2021   PROT 6.4 (L) 11/13/2021   ALBUMIN 3.9 11/13/2021   AST 11 (L) 11/13/2021   ALT 10 11/13/2021   ALKPHOS 53 11/13/2021   BILITOT 0.3 11/13/2021   GFRNONAA >60 11/13/2021   GFRAA >60 11/05/2019    Lab Results  Component Value Date   CEA1 1.77 01/04/2016    Lab Results  Component Value Date   INR 0.98 05/16/2016   LABPROT 13.0 05/16/2016    Imaging:  CT CHEST ABDOMEN PELVIS W CONTRAST  Result Date:  11/12/2021 CLINICAL DATA:  Restaging metastatic renal cell carcinoma. * Tracking Code: BO * EXAM: CT CHEST, ABDOMEN, AND PELVIS WITH CONTRAST TECHNIQUE: Multidetector CT imaging of the chest, abdomen and pelvis was performed following the standard protocol during bolus administration of intravenous contrast. The patient was premedicated according to the 13hour steroid and Benadryl prep due to a history of iodinated contrast allergy. There were no immediate complications. RADIATION DOSE REDUCTION: This exam was performed according to the departmental dose-optimization program which includes automated exposure control, adjustment of the mA and/or kV according to patient size and/or use of iterative reconstruction technique. CONTRAST:  157m OMNIPAQUE IOHEXOL 300 MG/ML  SOLN COMPARISON:  Multiple prior examinations, including CT 07/28/2021 and 03/03/2021. FINDINGS: CT CHEST FINDINGS Cardiovascular: No acute vascular findings. Atherosclerosis of the aorta, great vessels and coronary arteries. Right IJ Port-A-Cath extends to the superior cavoatrial junction. The heart size is normal. There is no pericardial effusion. Mediastinum/Nodes: There are no enlarged mediastinal, hilar or axillary lymph nodes. The thyroid gland, trachea and esophagus appear unchanged, without significant findings. Lungs/Pleura: No pleural effusion or pneumothorax. There are 2 new right upper lobe pulmonary nodules which are suspicious for metastatic disease, measuring 6 mm on image 32/4 and 4 mm on image 60/4. No other definite new or enlarging pulmonary nodules. Musculoskeletal/Chest wall: Multifocal osseous metastatic disease again noted with mixed lytic and sclerotic lesions, most  notable in the spine at T1 and T9. A lytic lesion laterally in the left 4th rib has mildly enlarged (best seen on sagittal image 127/6). No pathologic fracture or epidural tumor identified. CT ABDOMEN AND PELVIS FINDINGS Hepatobiliary: The liver is normal in density  without suspicious focal abnormality. A 1.4 cm cyst centrally in the right hepatic lobe on image 51/2 is unchanged. A small cystic lesion in the left lobe on image 53/2 is also stable, potentially a treated metastasis. No new or enlarging liver lesions are identified. Gallstones and chronic irregular gallbladder wall thickening at the gallbladder fundus are unchanged. No significant biliary dilatation. Pancreas: Unremarkable. No pancreatic ductal dilatation or surrounding inflammatory changes. Spleen: Normal in size without focal abnormality. Adrenals/Urinary Tract: No significant residual or recurrent adrenal gland nodularity. The treated lesion in the upper pole of the left kidney measures 2.0 x 2.1 cm on image 67/2 (previously 2.0 x 2.3 cm). No new or enlarging renal lesions. No evidence of urinary tract calculus or hydronephrosis. The bladder appears normal for its degree of distention. Stomach/Bowel: Enteric contrast was administered and has passed into the proximal colon. The stomach appears unremarkable for its degree of distension. No evidence of bowel wall thickening, distention or surrounding inflammatory change. Vascular/Lymphatic: There are no enlarged abdominal or pelvic lymph nodes. Small inguinal lymph nodes are unchanged. Aortic and branch vessel atherosclerosis without evidence of aneurysm or large vessel occlusion. Reproductive: Stable moderate enlargement of the prostate gland. Other: No evidence of abdominal wall mass or hernia. No ascites. Musculoskeletal: Grossly stable predominately lytic metastatic disease in the abdomen and pelvis, most prominent within the L4 vertebral body and left acetabulum. No significant progression or pathologic fracture identified. IMPRESSION: 1. Two new right upper lobe pulmonary nodules, suspicious for metastatic disease. Recommend attention on follow-up. 2. Widespread osseous metastatic disease is largely stable, although there is an enlarging lytic lesion  laterally in the left 4th rib, suspicious for progressive disease. 3. No significant residual hepatic or adrenal metastases. 4. The treated lesion in the upper pole of the left kidney has not significantly changed. 5. Cholelithiasis with chronic gallbladder wall thickening. Prostatomegaly. Coronary and Aortic Atherosclerosis (ICD10-I70.0). Electronically Signed   By: Richardean Sale M.D.   On: 11/12/2021 11:23    Medications: I have reviewed the patient's current medications.   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, L4 nerve compression Biopsy of the L4 mass 11/18/2015 confirmed metastatic renal cell carcinoma, clear cell type CTs of the chest, abdomen, and pelvis 11/18/2015-right lower lobe nodule, expansile lytic lesion at the right 11th rib/costal vertebral junction, left renal mass, expansile lesion involving the L4 vertebra, lytic lesion at the left acetabulum, and a low-attenuation liver lesion Initiation of SRS to L4 12/02/2015, Completed 12/12/2015 Initiation of Pazopanib 12/30/2015 Pazopanib placed on hold 02/06/2016 secondary to elevated liver enzymes Pazopanib resumed 03/07/2016 at a dose of 400 mg daily  Pazopanib discontinued 03/19/2016 secondary to elevated liver enzymes Restaging CTs 04/02/2016-stable left renal mass, decreased soft tissue component associated with the L4 metastasis, increased soft tissue component associated with the right 11th rib metastasis with increased T11 bony destruction, increased sclerosis at the left acetabulum lesion Cycle 1 nivolumab 04/12/2016 Cycle 2 nivolumab 04/26/2016 Cycle 3 nivolumab 05/11/2016 Cycle 4 nivolumab 05/24/2016 Cycle 5 nivolumab 06/08/2016 MRI lumbar spine 06/21/2016-unchanged tumor at L3, increased size of retroperitoneal lymph nodes compared to a CT from 04/02/2016 Cycle 6 nivolumab  06/22/2016 CTs chest, abdomen, and pelvis 07/04/2016-enlargement of the left renal mass,  right  adrenal nodule, left hilar and peritoneal lymph nodes, enlargement of left acetabular lesion. Stable lung nodules. Cycle 7 nivolumab 07/06/2016 Cycle 8 nivolumab 07/20/2016 Cycle 9 nivolumab 08/02/2016 Cycle 10 nivolumab 08/20/2016 Restaging CT 09/03/2016 evaluation with stable disease Cycle 11 nivolumab 09/05/2016 Cycle 12 nivolumab 09/19/2016 Cycle 13 nivolumab 10/03/2016 Cycle 14 nivolumab 10/17/2016 Cycle 15 nivolumab 10/31/2016 Cycle 16 nivolumab 11/14/2016 (changed to monthly schedule) Cycle 17 nivolumab 12/19/2016 CTs 01/21/2017-increased left renal mass, increased size of adrenal metastases, increased lytic bone lesions, increased left lung nodule, persistent tumor at L4 with probable epidural component Initiation of Cabozantinib 01/28/2017 Restaging CTs 05/30/2017- decreased size of left hilar mass, left renal mass, retroperitoneal adenopathy, and adrenal metastasis.  Healing bone lesions. Cabozantinib continued CTs 10/21/2017- interval enlargement left hilar lymph node; stable rib lesions; stable mass left renal cortex; stable mildly nodular adrenal glands; stable lytic lesions within the pelvis and spine. Cabozantinib continued CTs 02/21/2018- enlargement of an AP window lymph node.  Mildly enlarged left hilar lymph node is unchanged.  Primary renal cell carcinoma involving the upper pole of the left kidney appears similar.  Stable enlarged right periaortic lymph node adjacent to the renal vessels.  Stable multifocal bony metastatic disease. Cabozantinib continued CTs 07/29/2018- stable 15 mm AP window nodes; stable left hilar node; subcarinal node slightly larger; right periaortic node 16 mm, previously 12 mm; portacaval node 24 mm, previously 16 mm; 15 mm node superior to the pancreatic head has enlarged; interval increase nodularity of both adrenal glands; stable left kidney mass; multifocal bony metastatic disease not significantly changed. Cabozantinib continued CTs 11/06/2018-  moderate improvement in thoracic adenopathy; minimal improvement abdominal adenopathy; left kidney upper pole mass and various lytic expansile bone lesions stable; mild increase in nodularity of left adrenal gland; right adrenal gland nodularity stable. 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  Peripheral enhancing masses consistent with metastases. No hemorrhage. MRI brain 03/11/2019-7 enhancing brain masses consistent with metastatic disease SRS to 7 brain lesions, treatment given 03/19/2019, 03/23/2019, and 03/25/2019 CTs 04/09/2019-decrease in left renal mass, bilateral adrenal nodules, AP window and porta hepatic adenopathy.  New 9 mm right lower lobe nodule.  Improved lytic lesion of the left acetabulum.  Other bone lesions are stable. Cabozantinib continued MRI brain 07/17/2019-resolution of 4 mm treated lesion in the right frontal cortex, 6 remaining treated lesions have decreased in size, new punctate metastasis in the superior right cerebellum SRS to right cerebellar lesion 07/30/2019 CTs 08/17/2019-previously noted right lower lobe nodule resolved, stable left kidney mass, mixed lytic/sclerotic bone lesions in the thoracolumbar spine, right posterior ribs, and left acetabulum-unchanged, no evidence of progressive disease  Cabozantinib continued MRI brain 10/23/2019-stable to slight decrease in size of multiple enhancing intracranial lesions.  Slight increase in surrounding edema in the medial left frontal lobe.  No new lesions present. MRI brain 01/29/2020-multiple enhancing brain lesions, some hemorrhagic, some with mild enlargement-treatment effect? CTs 02/08/2020-no thoracic metastases, enlargement of the left renal mass, enlargement of lytic lesion at T9, other lytic lesions unchanged MRI brain 05/04/2020-no new lesions, slight enlargement of a right occipital lesion, other lesions are  stable or decreased in size CTs 08/11/2020-enlargement of left kidney mass, new 1.6 cm segment 7 liver lesion, enlargement of a lytic lesion at T9, increased lysis of a sclerotic lesion at the right second rib, 0.7 cm endoluminal nodule at the bladder dome-enlarged Brain MRI 08/10/2020-slight increase in a 15 mm left frontal lobe lesion,  new punctate focus in the right occipital cortex, other lesions stable or slightly decreased CTs 11/01/2020-increase in left suprahilar opacity, enlargement of previous liver metastases, several new subcentimeter lesions, increase in left renal mass, similar appearance of bone metastases, stable hyperdense/enhancing nodule in the left bladder Brain MRI 11/04/2020-no change in brain metastases Lenvatinib/pembrolizumab 11/30/2020 CTs 03/03/2021-stable left hilar lymph nodes; resolution of left upper lobe perihilar nodularity; unchanged for millimeter peripheral right upper lobe nodule; multiple lytic bone lesions appear stable; left kidney lesion decreased in size; bilateral adrenal nodules decreased in size; several liver lesion showed mild increase in size; several new small lesions within the right hepatic lobe. Lenvatinib/pembrolizumab continued 03/07/2021 Brain MRI 03/10/2021-progression of a left frontal metastasis, tiny new metastasis in the right frontal lobe-referred for SBRT to the right frontal lesion and to [facility] to consider a LITT procedure for the progressive left frontal lesion SRS 04/06/2021, LITT procedure at [facility] 04/18/2021-pathology metastatic renal cell carcinoma in background of necrosis/gliosis Lenvatinib on hold Pembrolizumab held 04/24/2021 Pembrolizumab 05/01/2021, lenvatinib remains on hold pending surgical evaluation Pembrolizumab 05/25/2021, lenvatinib resumed Pembrolizumab 06/15/2021 Lenvima resumed 06/21/2021 CTs 07/28/2021-continued regression left renal lesion.  Much improved appearance of the liver.  Continued regression of bilateral adrenal gland  nodules.  Stable mixed lytic and sclerotic metastatic bone disease.  No new or progressive findings. Pembrolizumab 07/31/2021, lenvatinib continued Pembrolizumab held 08/21/2021 due to generalized weakness Pembrolizumab resumed 09/11/2021 MRI brain 10/19/2021-"new "focus of minimal enhancement of the right frontal lobe, larger punctate right cerebellar lesion, review of MRI found the right frontal lesion was present in 2021 and was treated with SRS CTs 11/12/2021-2 new right upper lung nodules, stable left renal lesion, enlarging lytic lesion in the left fourth rib  Pain secondary to #1-managed by Dr. Lovenia Shuck.  Improved Hypertension Elevated transaminases 02/06/2016- Pazopanib placed on hold Liver enzymes normal 03/07/2016 Port-A-Cath placement 05/16/2016 Malaise/anorexia 09/05/2016. Cortisol and testosterone levels low. Hydrocortisone and testosterone replacement initiated. Conjunctival/scleral erythema 09/19/2016-resolved with steroid eyedrops Proximal right leg weakness. 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 Hypercalcemia status post Zometa 01/23/2017-resolved Pruritic rash following IV contrast 10/21/2017; rash following IV contrast 02/21/2018 despite prednisone/Benadryl premedication Brief episodes of expressive aphasia October and December 2020 Vitamin B12 deficiency confirmed on lab 03/07/2021-started oral vitamin B12 replacement 03/28/2021       Disposition: Phillip [name] continues to tolerate the pembrolizumab and lenvatinib well.  I reviewed the restaging CT findings and images with him.  The overall pattern is consistent with stable disease, though there are a few tiny right lung nodules which may be new.  The brain MRI is also consistent with overall stable disease.  We discussed treatment options.  I recommend continuing pembrolizumab and lenvatinib.  He agrees.  We discussed the option of discontinuing treatment.  He understands it is always his  decision on whether to continue treatment.  I suspect his altered mental status, "depression" symptoms, and fatigue are largely due to the effects of brain radiation.  The plan is to continue lenvatinib and pembrolizumab for now.  He will return for an office visit in 3 weeks.  We will plan for a restaging CT evaluation in 3 months. Betsy Coder, MD  11/13/2021  11:35 AM

## 2021-11-13 NOTE — Patient Instructions (Signed)

## 2021-11-14 ENCOUNTER — Telehealth: Payer: Self-pay | Admitting: *Deleted

## 2021-11-14 NOTE — Telephone Encounter (Signed)
Wife [name] called to report that patient had stopped remeron and started zoloft.  Since that change she has noticed a dramatic decline in patient's mood.  She reports that he denies having any suicide ideation.  Reports hopelessness with proceeding with treatment.    Discussed with Dr Mickeal Skinner and he advised to instruct patient to stop Zoloft (no need to taper) and patient to go back on Remeron.  If mood does not improve in a couple of weeks we should schedule in office visit to discuss other options or if dramatically worsens prior to then to call.   Wife states understanding.

## 2021-11-15 ENCOUNTER — Telehealth: Payer: Self-pay

## 2021-11-15 DIAGNOSIS — C649 Malignant neoplasm of unspecified kidney, except renal pelvis: Secondary | ICD-10-CM

## 2021-11-15 NOTE — Telephone Encounter (Signed)
Wife called requesting Mirtazapine prescription for her husband.  She thought that he had some remaining pills left from prior prescription but hhe does not.  Routed to provider.

## 2021-11-16 ENCOUNTER — Other Ambulatory Visit (HOSPITAL_COMMUNITY): Payer: Self-pay

## 2021-11-16 ENCOUNTER — Encounter: Payer: Self-pay | Admitting: *Deleted

## 2021-11-16 ENCOUNTER — Encounter: Payer: Self-pay | Admitting: Oncology

## 2021-11-16 MED ORDER — MIRTAZAPINE 30 MG PO TABS: 30.0000 mg | ORAL_TABLET | Freq: Every day | ORAL | 3 refills | Status: DC

## 2021-11-16 NOTE — Addendum Note (Signed)
Addended by: [name] on: 11/16/2021 12:50 PM   Modules accepted: Orders

## 2021-11-16 NOTE — Progress Notes (Unsigned)
Approval received from plan via Cover My Meds for Lidocaine-Prilocaine cream from 11/16/21-02/15/22.

## 2021-11-20 ENCOUNTER — Other Ambulatory Visit (HOSPITAL_COMMUNITY): Payer: Self-pay

## 2021-11-24 ENCOUNTER — Other Ambulatory Visit (HOSPITAL_COMMUNITY): Payer: Self-pay

## 2021-11-27 ENCOUNTER — Other Ambulatory Visit (HOSPITAL_COMMUNITY): Payer: Self-pay

## 2021-11-28 ENCOUNTER — Other Ambulatory Visit: Payer: Self-pay | Admitting: Nurse Practitioner

## 2021-11-28 ENCOUNTER — Other Ambulatory Visit (HOSPITAL_COMMUNITY): Payer: Self-pay

## 2021-11-28 DIAGNOSIS — C649 Malignant neoplasm of unspecified kidney, except renal pelvis: Secondary | ICD-10-CM

## 2021-11-28 MED ORDER — OXYCODONE HCL 5 MG PO TABS
5.0000 mg | ORAL_TABLET | Freq: Four times a day (QID) | ORAL | 0 refills | Status: DC | PRN
  Filled 2021-11-28: qty 60, 15d supply, fill #0

## 2021-11-28 MED ORDER — OXYCODONE HCL ER 10 MG PO T12A
EXTENDED_RELEASE_TABLET | Freq: Two times a day (BID) | ORAL | 0 refills | Status: DC
  Filled 2021-11-28 – 2021-11-29 (×2): qty 120, 30d supply, fill #0

## 2021-11-29 ENCOUNTER — Other Ambulatory Visit (HOSPITAL_COMMUNITY): Payer: Self-pay

## 2021-11-30 ENCOUNTER — Other Ambulatory Visit (HOSPITAL_COMMUNITY): Payer: Self-pay

## 2021-11-30 ENCOUNTER — Other Ambulatory Visit: Payer: Self-pay | Admitting: Oncology

## 2021-11-30 MED ORDER — LENVIMA (18 MG DAILY DOSE) 10 MG & 2 X 4 MG PO CPPK
18.0000 mg | ORAL_CAPSULE | Freq: Every day | ORAL | 0 refills | Status: DC
  Filled 2021-11-30: qty 90, 30d supply, fill #0

## 2021-12-03 ENCOUNTER — Other Ambulatory Visit: Payer: Self-pay | Admitting: Oncology

## 2021-12-05 ENCOUNTER — Inpatient Hospital Stay (HOSPITAL_BASED_OUTPATIENT_CLINIC_OR_DEPARTMENT_OTHER): Payer: PPO | Admitting: Oncology

## 2021-12-05 ENCOUNTER — Inpatient Hospital Stay: Payer: PPO | Attending: Nurse Practitioner

## 2021-12-05 ENCOUNTER — Inpatient Hospital Stay: Payer: PPO

## 2021-12-05 ENCOUNTER — Encounter: Payer: Self-pay | Admitting: *Deleted

## 2021-12-05 ENCOUNTER — Other Ambulatory Visit (HOSPITAL_COMMUNITY): Payer: Self-pay

## 2021-12-05 VITALS — BP 157/78 | HR 43 | Resp 20

## 2021-12-05 VITALS — BP 164/68 | HR 63 | Temp 98.1°F | Resp 18 | Ht 71.0 in | Wt 253.0 lb

## 2021-12-05 DIAGNOSIS — C649 Malignant neoplasm of unspecified kidney, except renal pelvis: Secondary | ICD-10-CM

## 2021-12-05 DIAGNOSIS — E538 Deficiency of other specified B group vitamins: Secondary | ICD-10-CM | POA: Insufficient documentation

## 2021-12-05 DIAGNOSIS — C787 Secondary malignant neoplasm of liver and intrahepatic bile duct: Secondary | ICD-10-CM | POA: Diagnosis not present

## 2021-12-05 DIAGNOSIS — Z79899 Other long term (current) drug therapy: Secondary | ICD-10-CM | POA: Insufficient documentation

## 2021-12-05 DIAGNOSIS — C642 Malignant neoplasm of left kidney, except renal pelvis: Secondary | ICD-10-CM | POA: Insufficient documentation

## 2021-12-05 DIAGNOSIS — C7931 Secondary malignant neoplasm of brain: Secondary | ICD-10-CM | POA: Diagnosis not present

## 2021-12-05 DIAGNOSIS — Z23 Encounter for immunization: Secondary | ICD-10-CM | POA: Diagnosis not present

## 2021-12-05 DIAGNOSIS — C7971 Secondary malignant neoplasm of right adrenal gland: Secondary | ICD-10-CM | POA: Diagnosis not present

## 2021-12-05 DIAGNOSIS — C7951 Secondary malignant neoplasm of bone: Secondary | ICD-10-CM | POA: Insufficient documentation

## 2021-12-05 DIAGNOSIS — C7972 Secondary malignant neoplasm of left adrenal gland: Secondary | ICD-10-CM | POA: Insufficient documentation

## 2021-12-05 DIAGNOSIS — Z5112 Encounter for antineoplastic immunotherapy: Secondary | ICD-10-CM | POA: Insufficient documentation

## 2021-12-05 LAB — CBC WITH DIFFERENTIAL (CANCER CENTER ONLY)
Abs Immature Granulocytes: 0.02 10*3/uL (ref 0.00–0.07)
Basophils Absolute: 0.1 10*3/uL (ref 0.0–0.1)
Basophils Relative: 1 %
Eosinophils Absolute: 0.9 10*3/uL — ABNORMAL HIGH (ref 0.0–0.5)
Eosinophils Relative: 11 %
HCT: 34.4 % — ABNORMAL LOW (ref 39.0–52.0)
Hemoglobin: 10.4 g/dL — ABNORMAL LOW (ref 13.0–17.0)
Immature Granulocytes: 0 %
Lymphocytes Relative: 24 %
Lymphs Abs: 1.9 10*3/uL (ref 0.7–4.0)
MCH: 26.2 pg (ref 26.0–34.0)
MCHC: 30.2 g/dL (ref 30.0–36.0)
MCV: 86.6 fL (ref 80.0–100.0)
Monocytes Absolute: 0.9 10*3/uL (ref 0.1–1.0)
Monocytes Relative: 11 %
Neutro Abs: 4.2 10*3/uL (ref 1.7–7.7)
Neutrophils Relative %: 53 %
Platelet Count: 321 10*3/uL (ref 150–400)
RBC: 3.97 MIL/uL — ABNORMAL LOW (ref 4.22–5.81)
RDW: 16.7 % — ABNORMAL HIGH (ref 11.5–15.5)
WBC Count: 8 10*3/uL (ref 4.0–10.5)
nRBC: 0 % (ref 0.0–0.2)

## 2021-12-05 LAB — CMP (CANCER CENTER ONLY)
ALT: 9 U/L (ref 0–44)
AST: 9 U/L — ABNORMAL LOW (ref 15–41)
Albumin: 3.8 g/dL (ref 3.5–5.0)
Alkaline Phosphatase: 61 U/L (ref 38–126)
Anion gap: 9 (ref 5–15)
BUN: 14 mg/dL (ref 8–23)
CO2: 29 mmol/L (ref 22–32)
Calcium: 9.3 mg/dL (ref 8.9–10.3)
Chloride: 100 mmol/L (ref 98–111)
Creatinine: 0.84 mg/dL (ref 0.61–1.24)
GFR, Estimated: >60 mL/min (ref >60)
Glucose, Bld: 178 mg/dL — ABNORMAL HIGH (ref 70–99)
Potassium: 4 mmol/L (ref 3.5–5.1)
Sodium: 138 mmol/L (ref 135–145)
Total Bilirubin: 0.2 mg/dL — ABNORMAL LOW (ref 0.3–1.2)
Total Protein: 6.8 g/dL (ref 6.5–8.1)

## 2021-12-05 LAB — TSH: TSH: 0.979 u[IU]/mL (ref 0.350–4.500)

## 2021-12-05 LAB — TOTAL PROTEIN, URINE DIPSTICK: Protein, ur: >300 mg/dL — AB

## 2021-12-05 MED ORDER — SODIUM CHLORIDE 0.9% FLUSH
10.0000 mL | INTRAVENOUS | Status: DC | PRN
  Administered 2021-12-05: 10 mL

## 2021-12-05 MED ORDER — INFLUENZA VAC A&B SA ADJ QUAD 0.5 ML IM PRSY
0.5000 mL | PREFILLED_SYRINGE | Freq: Once | INTRAMUSCULAR | Status: AC
  Administered 2021-12-05: 0.5 mL via INTRAMUSCULAR
  Filled 2021-12-05: qty 0.5

## 2021-12-05 MED ORDER — HEPARIN SOD (PORK) LOCK FLUSH 100 UNIT/ML IV SOLN
500.0000 [IU] | Freq: Once | INTRAVENOUS | Status: AC | PRN
  Administered 2021-12-05: 500 [IU]

## 2021-12-05 MED ORDER — SODIUM CHLORIDE 0.9 % IV SOLN: Freq: Once | INTRAVENOUS | Status: AC

## 2021-12-05 MED ORDER — SODIUM CHLORIDE 0.9 % IV SOLN
200.0000 mg | Freq: Once | INTRAVENOUS | Status: AC
  Administered 2021-12-05: 200 mg via INTRAVENOUS
  Filled 2021-12-05: qty 8

## 2021-12-05 NOTE — Progress Notes (Signed)
[place] [facility] OFFICE PROGRESS NOTE   Diagnosis: Renal cell carcinoma  INTERVAL HISTORY:   Phillip [name] returns as scheduled.  He continues treatment with pembrolizumab and lenvatinib.  He has not improved energy level since discontinuing Zoloft.  He resumed Remeron.  Stable pain.  He takes OxyContin and oxycodone.  He continues lenvatinib.  No diarrhea.  Objective:  Vital signs in last 24 hours:  Blood pressure (!) 164/68, pulse 63, temperature 98.1 F (36.7 C), temperature source Oral, resp. rate 18, height '5\' 11"'$  (1.803 m), weight 253 lb (114.8 kg), SpO2 98 %.    HEENT: No thrush or ulcers Resp: Lungs clear bilaterally Cardio: Regular rate and rhythm GI: No hepatosplenomegaly Vascular: Trace lower leg edema bilaterally  Skin: Mild acne type rash over the anterior chest  Portacath/PICC-without erythema  Lab Results:  Lab Results  Component Value Date   WBC 8.0 12/05/2021   HGB 10.4 (L) 12/05/2021   HCT 34.4 (L) 12/05/2021   MCV 86.6 12/05/2021   PLT 321 12/05/2021   NEUTROABS 4.2 12/05/2021    CMP  Lab Results  Component Value Date   NA 134 (L) 11/13/2021   K 3.3 (L) 11/13/2021   CL 95 (L) 11/13/2021   CO2 30 11/13/2021   GLUCOSE 157 (H) 11/13/2021   BUN 18 11/13/2021   CREATININE 0.86 11/13/2021   CALCIUM 9.0 11/13/2021   PROT 6.4 (L) 11/13/2021   ALBUMIN 3.9 11/13/2021   AST 11 (L) 11/13/2021   ALT 10 11/13/2021   ALKPHOS 53 11/13/2021   BILITOT 0.3 11/13/2021   GFRNONAA >60 11/13/2021   GFRAA >60 11/05/2019    Lab Results  Component Value Date   CEA1 1.77 01/04/2016    Medications: I have reviewed the patient's current medications.   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, L4 nerve compression Biopsy of the L4 mass 11/18/2015 confirmed metastatic renal cell carcinoma, clear cell type CTs of the chest, abdomen, and pelvis 11/18/2015-right lower lobe nodule, expansile lytic lesion at  the right 11th rib/costal vertebral junction, left renal mass, expansile lesion involving the L4 vertebra, lytic lesion at the left acetabulum, and a low-attenuation liver lesion Initiation of SRS to L4 12/02/2015, Completed 12/12/2015 Initiation of Pazopanib 12/30/2015 Pazopanib placed on hold 02/06/2016 secondary to elevated liver enzymes Pazopanib resumed 03/07/2016 at a dose of 400 mg daily  Pazopanib discontinued 03/19/2016 secondary to elevated liver enzymes Restaging CTs 04/02/2016-stable left renal mass, decreased soft tissue component associated with the L4 metastasis, increased soft tissue component associated with the right 11th rib metastasis with increased T11 bony destruction, increased sclerosis at the left acetabulum lesion Cycle 1 nivolumab 04/12/2016 Cycle 2 nivolumab 04/26/2016 Cycle 3 nivolumab 05/11/2016 Cycle 4 nivolumab 05/24/2016 Cycle 5 nivolumab 06/08/2016 MRI lumbar spine 06/21/2016-unchanged tumor at L3, increased size of retroperitoneal lymph nodes compared to a CT from 04/02/2016 Cycle 6 nivolumab  06/22/2016 CTs chest, abdomen, and pelvis 07/04/2016-enlargement of the left renal mass, right adrenal nodule, left hilar and peritoneal lymph nodes, enlargement of left acetabular lesion. Stable lung nodules. Cycle 7 nivolumab 07/06/2016 Cycle 8 nivolumab 07/20/2016 Cycle 9 nivolumab 08/02/2016 Cycle 10 nivolumab 08/20/2016 Restaging CT 09/03/2016 evaluation with stable disease Cycle 11 nivolumab 09/05/2016 Cycle 12 nivolumab 09/19/2016 Cycle 13 nivolumab 10/03/2016 Cycle 14 nivolumab 10/17/2016 Cycle 15 nivolumab 10/31/2016 Cycle 16 nivolumab 11/14/2016 (changed to monthly schedule) Cycle 17 nivolumab 12/19/2016 CTs 01/21/2017-increased left renal mass, increased size of adrenal metastases, increased lytic bone lesions, increased left lung nodule, persistent  tumor at L4 with probable epidural component Initiation of Cabozantinib 01/28/2017 Restaging CTs 05/30/2017-  decreased size of left hilar mass, left renal mass, retroperitoneal adenopathy, and adrenal metastasis.  Healing bone lesions. Cabozantinib continued CTs 10/21/2017- interval enlargement left hilar lymph node; stable rib lesions; stable mass left renal cortex; stable mildly nodular adrenal glands; stable lytic lesions within the pelvis and spine. Cabozantinib continued CTs 02/21/2018- enlargement of an AP window lymph node.  Mildly enlarged left hilar lymph node is unchanged.  Primary renal cell carcinoma involving the upper pole of the left kidney appears similar.  Stable enlarged right periaortic lymph node adjacent to the renal vessels.  Stable multifocal bony metastatic disease. Cabozantinib continued CTs 07/29/2018- stable 15 mm AP window nodes; stable left hilar node; subcarinal node slightly larger; right periaortic node 16 mm, previously 12 mm; portacaval node 24 mm, previously 16 mm; 15 mm node superior to the pancreatic head has enlarged; interval increase nodularity of both adrenal glands; stable left kidney mass; multifocal bony metastatic disease not significantly changed. Cabozantinib continued CTs 11/06/2018- moderate improvement in thoracic adenopathy; minimal improvement abdominal adenopathy; left kidney upper pole mass and various lytic expansile bone lesions stable; mild increase in nodularity of left adrenal gland; right adrenal gland nodularity stable. 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  Peripheral enhancing masses consistent with metastases. No hemorrhage. MRI brain 03/11/2019-7 enhancing brain masses consistent with metastatic disease SRS to 7 brain lesions, treatment given 03/19/2019, 03/23/2019, and 03/25/2019 CTs 04/09/2019-decrease in left renal mass, bilateral adrenal nodules, AP window and porta hepatic adenopathy.  New 9 mm right lower lobe nodule.  Improved lytic  lesion of the left acetabulum.  Other bone lesions are stable. Cabozantinib continued MRI brain 07/17/2019-resolution of 4 mm treated lesion in the right frontal cortex, 6 remaining treated lesions have decreased in size, new punctate metastasis in the superior right cerebellum SRS to right cerebellar lesion 07/30/2019 CTs 08/17/2019-previously noted right lower lobe nodule resolved, stable left kidney mass, mixed lytic/sclerotic bone lesions in the thoracolumbar spine, right posterior ribs, and left acetabulum-unchanged, no evidence of progressive disease  Cabozantinib continued MRI brain 10/23/2019-stable to slight decrease in size of multiple enhancing intracranial lesions.  Slight increase in surrounding edema in the medial left frontal lobe.  No new lesions present. MRI brain 01/29/2020-multiple enhancing brain lesions, some hemorrhagic, some with mild enlargement-treatment effect? CTs 02/08/2020-no thoracic metastases, enlargement of the left renal mass, enlargement of lytic lesion at T9, other lytic lesions unchanged MRI brain 05/04/2020-no new lesions, slight enlargement of a right occipital lesion, other lesions are stable or decreased in size CTs 08/11/2020-enlargement of left kidney mass, new 1.6 cm segment 7 liver lesion, enlargement of a lytic lesion at T9, increased lysis of a sclerotic lesion at the right second rib, 0.7 cm endoluminal nodule at the bladder dome-enlarged Brain MRI 08/10/2020-slight increase in a 15 mm left frontal lobe lesion, new punctate focus in the right occipital cortex, other lesions stable or slightly decreased CTs 11/01/2020-increase in left suprahilar opacity, enlargement of previous liver metastases, several new subcentimeter lesions, increase in left renal mass, similar appearance of bone metastases, stable hyperdense/enhancing nodule in the left bladder Brain MRI 11/04/2020-no change in brain metastases Lenvatinib/pembrolizumab 11/30/2020 CTs 03/03/2021-stable left hilar  lymph nodes; resolution of left upper lobe perihilar nodularity; unchanged for millimeter peripheral right upper lobe nodule; multiple lytic bone lesions appear stable; left kidney lesion decreased in size; bilateral adrenal nodules  decreased in size; several liver lesion showed mild increase in size; several new small lesions within the right hepatic lobe. Lenvatinib/pembrolizumab continued 03/07/2021 Brain MRI 03/10/2021-progression of a left frontal metastasis, tiny new metastasis in the right frontal lobe-referred for SBRT to the right frontal lesion and to [facility] to consider a LITT procedure for the progressive left frontal lesion SRS 04/06/2021, LITT procedure at [facility] 04/18/2021-pathology metastatic renal cell carcinoma in background of necrosis/gliosis Lenvatinib on hold Pembrolizumab held 04/24/2021 Pembrolizumab 05/01/2021, lenvatinib remains on hold pending surgical evaluation Pembrolizumab 05/25/2021, lenvatinib resumed Pembrolizumab 06/15/2021 Lenvima resumed 06/21/2021 CTs 07/28/2021-continued regression left renal lesion.  Much improved appearance of the liver.  Continued regression of bilateral adrenal gland nodules.  Stable mixed lytic and sclerotic metastatic bone disease.  No new or progressive findings. Pembrolizumab 07/31/2021, lenvatinib continued Pembrolizumab held 08/21/2021 due to generalized weakness Pembrolizumab resumed 09/11/2021 MRI brain 10/19/2021-"new "focus of minimal enhancement of the right frontal lobe, larger punctate right cerebellar lesion, review of MRI found the right frontal lesion was present in 2021 and was treated with SRS CTs 11/12/2021-2 new right upper lung nodules, stable left renal lesion, enlarging lytic lesion in the left fourth rib Pembrolizumab and lenvatinib continued  Pain secondary to #1-managed by Dr. Lovenia Shuck.  Improved Hypertension Elevated transaminases 02/06/2016- Pazopanib placed on hold Liver enzymes normal 03/07/2016 Port-A-Cath placement  05/16/2016 Malaise/anorexia 09/05/2016. Cortisol and testosterone levels low. Hydrocortisone and testosterone replacement initiated. Conjunctival/scleral erythema 09/19/2016-resolved with steroid eyedrops Proximal right leg weakness. 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 Hypercalcemia status post Zometa 01/23/2017-resolved Pruritic rash following IV contrast 10/21/2017; rash following IV contrast 02/21/2018 despite prednisone/Benadryl premedication Brief episodes of expressive aphasia October and December 2020 Vitamin B12 deficiency confirmed on lab 03/07/2021-started oral vitamin B12 replacement 03/28/2021         Disposition: Mr. Minor has an improved performance status since discontinuing Zoloft.  His affect and energy appear improved.  He would like to continue treatment.  The plan is to continue pembrolizumab/lenvatinib.  He will complete another cycle of pembrolizumab today.  He will return for an office visit in 3 weeks.  He will receive an influenza vaccine today.  Betsy Coder, MD  12/05/2021  10:07 AM

## 2021-12-05 NOTE — Progress Notes (Signed)
Patient seen by Dr. [name] today  Vitals are within treatment parameters.  No intervention for SBP 164  Labs reviewed by Dr. [name] and are within treatment parameters. *Urine protein pending collection for Lenvatinib before he leaves Per physician team, patient is ready for treatment and there are NO modifications to the treatment plan.

## 2021-12-05 NOTE — Patient Instructions (Signed)
[facility]   Discharge Instructions: Thank you for choosing [facility] to provide your oncology and hematology care.   If you have a lab appointment with the Cancer Center, please go directly to the Cancer Center and check in at the registration area.   Wear comfortable clothing and clothing appropriate for easy access to any Portacath or PICC line.   We strive to give you quality time with your provider. You may need to reschedule your appointment if you arrive late (15 or more minutes).  Arriving late affects you and other patients whose appointments are after yours.  Also, if you miss three or more appointments without notifying the office, you may be dismissed from the clinic at the provider's discretion.      For prescription refill requests, have your pharmacy contact our office and allow 72 hours for refills to be completed.    Today you received the following chemotherapy and/or immunotherapy agents Pembrolizumab (KEYTRUDA).      To help prevent nausea and vomiting after your treatment, we encourage you to take your nausea medication as directed.  BELOW ARE SYMPTOMS THAT SHOULD BE REPORTED IMMEDIATELY: *FEVER GREATER THAN 100.4 F (38 C) OR HIGHER *CHILLS OR SWEATING *NAUSEA AND VOMITING THAT IS NOT CONTROLLED WITH YOUR NAUSEA MEDICATION *UNUSUAL SHORTNESS OF BREATH *UNUSUAL BRUISING OR BLEEDING *URINARY PROBLEMS (pain or burning when urinating, or frequent urination) *BOWEL PROBLEMS (unusual diarrhea, constipation, pain near the anus) TENDERNESS IN MOUTH AND THROAT WITH OR WITHOUT PRESENCE OF ULCERS (sore throat, sores in mouth, or a toothache) UNUSUAL RASH, SWELLING OR PAIN  UNUSUAL VAGINAL DISCHARGE OR ITCHING   Items with * indicate a potential emergency and should be followed up as soon as possible or go to the Emergency Department if any problems should occur.  Please show the CHEMOTHERAPY ALERT CARD or IMMUNOTHERAPY ALERT CARD  at check-in to the Emergency Department and triage nurse.  Should you have questions after your visit or need to cancel or reschedule your appointment, please contact [facility]  Dept: [phone]  and follow the prompts.  Office hours are 8:00 a.m. to 4:30 p.m. Monday - Friday. Please note that voicemails left after 4:00 p.m. may not be returned until the following business day.  We are closed weekends and major holidays. You have access to a nurse at all times for urgent questions. Please call the main number to the clinic Dept: [phone] and follow the prompts.   For any non-urgent questions, you may also contact your provider using MyChart. We now offer e-Visits for anyone 18 and older to request care online for non-urgent symptoms. For details visit [email or web address].   Also download the MyChart app! Go to the app store, search "MyChart", open the app, select [facility], and log in with your MyChart username and password.  Masks are optional in the cancer centers. If you would like for your care team to wear a mask while they are taking care of you, please let them know. You may have one support person who is at least 77 years old accompany you for your appointments.  Pembrolizumab Injection What is this medication? PEMBROLIZUMAB (PEM broe LIZ ue mab) treats some types of cancer. It works by helping your immune system slow or stop the spread of cancer cells. It is a monoclonal antibody. This medicine may be used for other purposes; ask your health care provider or pharmacist if you have questions. COMMON BRAND NAME(S):   Keytruda What should I tell my care team before I take this medication? They need to know if you have any of these conditions: Allogeneic stem cell transplant (uses someone else's stem cells) Autoimmune diseases, such as Crohn disease, ulcerative colitis, lupus History of chest radiation Nervous system problems, such as Guillain-Barre  syndrome, myasthenia gravis Organ transplant An unusual or allergic reaction to pembrolizumab, other medications, foods, dyes, or preservatives Pregnant or trying to get pregnant Breast-feeding How should I use this medication? This medication is injected into a vein. It is given by your care team in a hospital or clinic setting. A special MedGuide will be given to you before each treatment. Be sure to read this information carefully each time. Talk to your care team about the use of this medication in children. While it may be prescribed for children as young as 6 months for selected conditions, precautions do apply. Overdosage: If you think you have taken too much of this medicine contact a poison control center or emergency room at once. NOTE: This medicine is only for you. Do not share this medicine with others. What if I miss a dose? Keep appointments for follow-up doses. It is important not to miss your dose. Call your care team if you are unable to keep an appointment. What may interact with this medication? Interactions have not been studied. This list may not describe all possible interactions. Give your health care provider a list of all the medicines, herbs, non-prescription drugs, or dietary supplements you use. Also tell them if you smoke, drink alcohol, or use illegal drugs. Some items may interact with your medicine. What should I watch for while using this medication? Your condition will be monitored carefully while you are receiving this medication. You may need blood work while taking this medication. This medication may cause serious skin reactions. They can happen weeks to months after starting the medication. Contact your care team right away if you notice fevers or flu-like symptoms with a rash. The rash may be red or purple and then turn into blisters or peeling of the skin. You may also notice a red rash with swelling of the face, lips, or lymph nodes in your neck or under  your arms. Tell your care team right away if you have any change in your eyesight. Talk to your care team if you may be pregnant. Serious birth defects can occur if you take this medication during pregnancy and for 4 months after the last dose. You will need a negative pregnancy test before starting this medication. Contraception is recommended while taking this medication and for 4 months after the last dose. Your care team can help you find the option that works for you. Do not breastfeed while taking this medication and for 4 months after the last dose. What side effects may I notice from receiving this medication? Side effects that you should report to your care team as soon as possible: Allergic reactions--skin rash, itching, hives, swelling of the face, lips, tongue, or throat Dry cough, shortness of breath or trouble breathing Eye pain, redness, irritation, or discharge with blurry or decreased vision Heart muscle inflammation--unusual weakness or fatigue, shortness of breath, chest pain, fast or irregular heartbeat, dizziness, swelling of the ankles, feet, or hands Hormone gland problems--headache, sensitivity to light, unusual weakness or fatigue, dizziness, fast or irregular heartbeat, increased sensitivity to cold or heat, excessive sweating, constipation, hair loss, increased thirst or amount of urine, tremors or shaking, irritability Infusion reactions--chest pain, shortness   of breath or trouble breathing, feeling faint or lightheaded Kidney injury (glomerulonephritis)--decrease in the amount of urine, red or dark brown urine, foamy or bubbly urine, swelling of the ankles, hands, or feet Liver injury--right upper belly pain, loss of appetite, nausea, light-colored stool, dark yellow or brown urine, yellowing skin or eyes, unusual weakness or fatigue Pain, tingling, or numbness in the hands or feet, muscle weakness, change in vision, confusion or trouble speaking, loss of balance or  coordination, trouble walking, seizures Rash, fever, and swollen lymph nodes Redness, blistering, peeling, or loosening of the skin, including inside the mouth Sudden or severe stomach pain, bloody diarrhea, fever, nausea, vomiting Side effects that usually do not require medical attention (report to your care team if they continue or are bothersome): Bone, joint, or muscle pain Diarrhea Fatigue Loss of appetite Nausea Skin rash This list may not describe all possible side effects. Call your doctor for medical advice about side effects. You may report side effects to FDA at 1-800-FDA-1088. Where should I keep my medication? This medication is given in a hospital or clinic. It will not be stored at home. NOTE: This sheet is a summary. It may not cover all possible information. If you have questions about this medicine, talk to your doctor, pharmacist, or health care provider.  2023 Elsevier/Gold Standard (2021-06-05 00:00:00)  

## 2021-12-06 ENCOUNTER — Telehealth: Payer: Self-pay | Admitting: *Deleted

## 2021-12-06 ENCOUNTER — Other Ambulatory Visit (HOSPITAL_COMMUNITY): Payer: Self-pay

## 2021-12-06 ENCOUNTER — Other Ambulatory Visit: Payer: Self-pay | Admitting: *Deleted

## 2021-12-06 DIAGNOSIS — C642 Malignant neoplasm of left kidney, except renal pelvis: Secondary | ICD-10-CM

## 2021-12-06 LAB — URINALYSIS, COMPLETE (UACMP) WITH MICROSCOPIC
Bilirubin Urine: NEGATIVE
Glucose, UA: NEGATIVE mg/dL
Hgb urine dipstick: NEGATIVE
Ketones, ur: NEGATIVE mg/dL
Leukocytes,Ua: NEGATIVE
Nitrite: NEGATIVE
Protein, ur: 100 mg/dL — AB
Specific Gravity, Urine: 1.019 (ref 1.005–1.030)
pH: 5.5 (ref 5.0–8.0)

## 2021-12-06 LAB — T4: T4, Total: 6.4 ug/dL (ref 4.5–12.0)

## 2021-12-06 NOTE — Progress Notes (Signed)
Contacted lab to add U/A today from sample collected on 10/10.

## 2021-12-06 NOTE — Telephone Encounter (Signed)
Notified Mrs. Varney that his urine protein is high and per Dr. [name], needs to hold Lenvatinib. Please come in to pick up 24 hour collection container for urine protein test to be returned next week. She agrees to do so. Added U/A today from yesterday's sample.

## 2021-12-07 ENCOUNTER — Telehealth: Payer: Self-pay | Admitting: *Deleted

## 2021-12-07 NOTE — Telephone Encounter (Signed)
Left VM that urine protein on U/A is lower, so OK to resume lenvatinib. Still needs to complete 24 hour urine collection.

## 2021-12-07 NOTE — Telephone Encounter (Signed)
-----   Message from Phillip Pier, MD sent at 12/06/2021  3:46 PM EDT ----- Please call patient, urine protein is lower on the complete urinalysis, okay to resume lenvatinib, return 24-hour urine for total protein next few weeks

## 2021-12-12 ENCOUNTER — Other Ambulatory Visit (HOSPITAL_COMMUNITY): Payer: Self-pay

## 2021-12-14 DIAGNOSIS — Z5112 Encounter for antineoplastic immunotherapy: Secondary | ICD-10-CM | POA: Diagnosis not present

## 2021-12-15 ENCOUNTER — Other Ambulatory Visit (HOSPITAL_BASED_OUTPATIENT_CLINIC_OR_DEPARTMENT_OTHER): Payer: Self-pay

## 2021-12-15 DIAGNOSIS — C642 Malignant neoplasm of left kidney, except renal pelvis: Secondary | ICD-10-CM

## 2021-12-15 LAB — PROTEIN, URINE, 24 HOUR
Collection Interval-UPROT: 24 hours
Protein, 24H Urine: 745 mg/d — ABNORMAL HIGH (ref 50–100)
Protein, Urine: 149 mg/dL
Urine Total Volume-UPROT: 500 mL

## 2021-12-18 ENCOUNTER — Telehealth: Payer: Self-pay

## 2021-12-18 NOTE — Telephone Encounter (Signed)
Call back placed to pt's wife. Per MD Sherrill, 24 hour urine protein results have been reviewed, ok to resume Lenvima. Pt's wife verbalizes understanding and agrees with plan of care.

## 2021-12-27 ENCOUNTER — Inpatient Hospital Stay: Payer: PPO

## 2021-12-27 ENCOUNTER — Inpatient Hospital Stay: Payer: PPO | Attending: Nurse Practitioner

## 2021-12-27 ENCOUNTER — Inpatient Hospital Stay (HOSPITAL_BASED_OUTPATIENT_CLINIC_OR_DEPARTMENT_OTHER): Payer: PPO | Admitting: Nurse Practitioner

## 2021-12-27 ENCOUNTER — Other Ambulatory Visit (HOSPITAL_BASED_OUTPATIENT_CLINIC_OR_DEPARTMENT_OTHER): Payer: Self-pay

## 2021-12-27 ENCOUNTER — Encounter: Payer: Self-pay | Admitting: Nurse Practitioner

## 2021-12-27 ENCOUNTER — Other Ambulatory Visit (HOSPITAL_COMMUNITY): Payer: Self-pay

## 2021-12-27 VITALS — BP 137/69 | HR 50

## 2021-12-27 VITALS — BP 149/74 | HR 53 | Temp 98.2°F | Resp 18 | Ht 71.0 in | Wt 248.0 lb

## 2021-12-27 DIAGNOSIS — N189 Chronic kidney disease, unspecified: Secondary | ICD-10-CM | POA: Insufficient documentation

## 2021-12-27 DIAGNOSIS — E538 Deficiency of other specified B group vitamins: Secondary | ICD-10-CM | POA: Diagnosis not present

## 2021-12-27 DIAGNOSIS — C642 Malignant neoplasm of left kidney, except renal pelvis: Secondary | ICD-10-CM

## 2021-12-27 DIAGNOSIS — I129 Hypertensive chronic kidney disease with stage 1 through stage 4 chronic kidney disease, or unspecified chronic kidney disease: Secondary | ICD-10-CM | POA: Insufficient documentation

## 2021-12-27 DIAGNOSIS — C7972 Secondary malignant neoplasm of left adrenal gland: Secondary | ICD-10-CM | POA: Insufficient documentation

## 2021-12-27 DIAGNOSIS — C7931 Secondary malignant neoplasm of brain: Secondary | ICD-10-CM | POA: Insufficient documentation

## 2021-12-27 DIAGNOSIS — Z5112 Encounter for antineoplastic immunotherapy: Secondary | ICD-10-CM | POA: Diagnosis not present

## 2021-12-27 DIAGNOSIS — Z79899 Other long term (current) drug therapy: Secondary | ICD-10-CM | POA: Diagnosis not present

## 2021-12-27 DIAGNOSIS — C7951 Secondary malignant neoplasm of bone: Secondary | ICD-10-CM | POA: Diagnosis not present

## 2021-12-27 LAB — CMP (CANCER CENTER ONLY)
ALT: 11 U/L (ref 0–44)
AST: 10 U/L — ABNORMAL LOW (ref 15–41)
Albumin: 3.7 g/dL (ref 3.5–5.0)
Alkaline Phosphatase: 83 U/L (ref 38–126)
Anion gap: 10 (ref 5–15)
BUN: 17 mg/dL (ref 8–23)
CO2: 29 mmol/L (ref 22–32)
Calcium: 9.2 mg/dL (ref 8.9–10.3)
Chloride: 101 mmol/L (ref 98–111)
Creatinine: 0.82 mg/dL (ref 0.61–1.24)
GFR, Estimated: >60 mL/min (ref >60)
Glucose, Bld: 188 mg/dL — ABNORMAL HIGH (ref 70–99)
Potassium: 3.8 mmol/L (ref 3.5–5.1)
Sodium: 140 mmol/L (ref 135–145)
Total Bilirubin: 0.2 mg/dL — ABNORMAL LOW (ref 0.3–1.2)
Total Protein: 6.6 g/dL (ref 6.5–8.1)

## 2021-12-27 LAB — CBC WITH DIFFERENTIAL (CANCER CENTER ONLY)
Abs Immature Granulocytes: 0.01 10*3/uL (ref 0.00–0.07)
Basophils Absolute: 0.1 10*3/uL (ref 0.0–0.1)
Basophils Relative: 1 %
Eosinophils Absolute: 0.8 10*3/uL — ABNORMAL HIGH (ref 0.0–0.5)
Eosinophils Relative: 11 %
HCT: 33.4 % — ABNORMAL LOW (ref 39.0–52.0)
Hemoglobin: 10.3 g/dL — ABNORMAL LOW (ref 13.0–17.0)
Immature Granulocytes: 0 %
Lymphocytes Relative: 28 %
Lymphs Abs: 2.1 10*3/uL (ref 0.7–4.0)
MCH: 26.1 pg (ref 26.0–34.0)
MCHC: 30.8 g/dL (ref 30.0–36.0)
MCV: 84.8 fL (ref 80.0–100.0)
Monocytes Absolute: 0.6 10*3/uL (ref 0.1–1.0)
Monocytes Relative: 9 %
Neutro Abs: 3.7 10*3/uL (ref 1.7–7.7)
Neutrophils Relative %: 51 %
Platelet Count: 327 10*3/uL (ref 150–400)
RBC: 3.94 MIL/uL — ABNORMAL LOW (ref 4.22–5.81)
RDW: 16.2 % — ABNORMAL HIGH (ref 11.5–15.5)
WBC Count: 7.3 10*3/uL (ref 4.0–10.5)
nRBC: 0 % (ref 0.0–0.2)

## 2021-12-27 LAB — TOTAL PROTEIN, URINE DIPSTICK: Protein, ur: 100 mg/dL — AB

## 2021-12-27 MED ORDER — SODIUM CHLORIDE 0.9% FLUSH
10.0000 mL | INTRAVENOUS | Status: DC | PRN
  Administered 2021-12-27: 10 mL

## 2021-12-27 MED ORDER — HEPARIN SOD (PORK) LOCK FLUSH 100 UNIT/ML IV SOLN
500.0000 [IU] | Freq: Once | INTRAVENOUS | Status: AC | PRN
  Administered 2021-12-27: 500 [IU]

## 2021-12-27 MED ORDER — SODIUM CHLORIDE 0.9 % IV SOLN: Freq: Once | INTRAVENOUS | Status: AC

## 2021-12-27 MED ORDER — COMIRNATY 30 MCG/0.3ML IM SUSY
PREFILLED_SYRINGE | INTRAMUSCULAR | 0 refills | Status: DC
  Filled 2021-12-27: qty 0.3, 1d supply, fill #0

## 2021-12-27 MED ORDER — SODIUM CHLORIDE 0.9 % IV SOLN
200.0000 mg | Freq: Once | INTRAVENOUS | Status: AC
  Administered 2021-12-27: 200 mg via INTRAVENOUS
  Filled 2021-12-27: qty 8

## 2021-12-27 NOTE — Progress Notes (Signed)
Patient seen by Lisa Thomas NP today  Vitals are within treatment parameters.  Labs reviewed by Lisa Thomas NP and are within treatment parameters.  Per physician team, patient is ready for treatment and there are NO modifications to the treatment plan.     

## 2021-12-27 NOTE — Progress Notes (Signed)
[place] OFFICE PROGRESS NOTE   Diagnosis: Renal cell carcinoma  INTERVAL HISTORY:   [name] returns as scheduled.  He continues treatment with pembrolizumab and lenvatinib.  No rash or diarrhea.  Energy level varies.  His wife feels sleep is excessive.  Pain is stable.  He continues OxyContin and oxycodone.  Objective:  Vital signs in last 24 hours:  Blood pressure (!) 149/74, pulse (!) 53, temperature 98.2 F (36.8 C), temperature source Oral, resp. rate 18, height '5\' 11"'$  (1.803 m), weight 248 lb (112.5 kg), SpO2 98 %.    HEENT: No thrush or ulcers. Resp: Lungs clear bilaterally. Cardio: Regular rate and rhythm. GI: No hepatosplenomegaly. Vascular: Trace edema lower leg bilaterally. Skin: Mild acne type rash anterior chest. Port-A-Cath without erythema.  Lab Results:  Lab Results  Component Value Date   WBC 7.3 12/27/2021   HGB 10.3 (L) 12/27/2021   HCT 33.4 (L) 12/27/2021   MCV 84.8 12/27/2021   PLT 327 12/27/2021   NEUTROABS 3.7 12/27/2021    Imaging:  No results found.  Medications: I have reviewed the patient's current medications.  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, L4 nerve compression Biopsy of the L4 mass 11/18/2015 confirmed metastatic renal cell carcinoma, clear cell type CTs of the chest, abdomen, and pelvis 11/18/2015-right lower lobe nodule, expansile lytic lesion at the right 11th rib/costal vertebral junction, left renal mass, expansile lesion involving the L4 vertebra, lytic lesion at the left acetabulum, and a low-attenuation liver lesion Initiation of SRS to L4 12/02/2015, Completed 12/12/2015 Initiation of Pazopanib 12/30/2015 Pazopanib placed on hold 02/06/2016 secondary to elevated liver enzymes Pazopanib resumed 03/07/2016 at a dose of 400 mg daily  Pazopanib discontinued 03/19/2016 secondary to elevated liver enzymes Restaging CTs 04/02/2016-stable left renal mass,  decreased soft tissue component associated with the L4 metastasis, increased soft tissue component associated with the right 11th rib metastasis with increased T11 bony destruction, increased sclerosis at the left acetabulum lesion Cycle 1 nivolumab 04/12/2016 Cycle 2 nivolumab 04/26/2016 Cycle 3 nivolumab 05/11/2016 Cycle 4 nivolumab 05/24/2016 Cycle 5 nivolumab 06/08/2016 MRI lumbar spine 06/21/2016-unchanged tumor at L3, increased size of retroperitoneal lymph nodes compared to a CT from 04/02/2016 Cycle 6 nivolumab  06/22/2016 CTs chest, abdomen, and pelvis 07/04/2016-enlargement of the left renal mass, right adrenal nodule, left hilar and peritoneal lymph nodes, enlargement of left acetabular lesion. Stable lung nodules. Cycle 7 nivolumab 07/06/2016 Cycle 8 nivolumab 07/20/2016 Cycle 9 nivolumab 08/02/2016 Cycle 10 nivolumab 08/20/2016 Restaging CT 09/03/2016 evaluation with stable disease Cycle 11 nivolumab 09/05/2016 Cycle 12 nivolumab 09/19/2016 Cycle 13 nivolumab 10/03/2016 Cycle 14 nivolumab 10/17/2016 Cycle 15 nivolumab 10/31/2016 Cycle 16 nivolumab 11/14/2016 (changed to monthly schedule) Cycle 17 nivolumab 12/19/2016 CTs 01/21/2017-increased left renal mass, increased size of adrenal metastases, increased lytic bone lesions, increased left lung nodule, persistent tumor at L4 with probable epidural component Initiation of Cabozantinib 01/28/2017 Restaging CTs 05/30/2017- decreased size of left hilar mass, left renal mass, retroperitoneal adenopathy, and adrenal metastasis.  Healing bone lesions. Cabozantinib continued CTs 10/21/2017- interval enlargement left hilar lymph node; stable rib lesions; stable mass left renal cortex; stable mildly nodular adrenal glands; stable lytic lesions within the pelvis and spine. Cabozantinib continued CTs 02/21/2018- enlargement of an AP window lymph node.  Mildly enlarged left hilar lymph node is unchanged.  Primary renal cell carcinoma involving  the upper pole of the left kidney appears similar.  Stable enlarged right periaortic lymph node adjacent to the renal vessels.  Stable multifocal bony metastatic disease. Cabozantinib continued CTs 07/29/2018- stable 15 mm AP window nodes; stable left hilar node; subcarinal node slightly larger; right periaortic node 16 mm, previously 12 mm; portacaval node 24 mm, previously 16 mm; 15 mm node superior to the pancreatic head has enlarged; interval increase nodularity of both adrenal glands; stable left kidney mass; multifocal bony metastatic disease not significantly changed. Cabozantinib continued CTs 11/06/2018- moderate improvement in thoracic adenopathy; minimal improvement abdominal adenopathy; left kidney upper pole mass and various lytic expansile bone lesions stable; mild increase in nodularity of left adrenal gland; right adrenal gland nodularity stable. 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  Peripheral enhancing masses consistent with metastases. No hemorrhage. MRI brain 03/11/2019-7 enhancing brain masses consistent with metastatic disease SRS to 7 brain lesions, treatment given 03/19/2019, 03/23/2019, and 03/25/2019 CTs 04/09/2019-decrease in left renal mass, bilateral adrenal nodules, AP window and porta hepatic adenopathy.  New 9 mm right lower lobe nodule.  Improved lytic lesion of the left acetabulum.  Other bone lesions are stable. Cabozantinib continued MRI brain 07/17/2019-resolution of 4 mm treated lesion in the right frontal cortex, 6 remaining treated lesions have decreased in size, new punctate metastasis in the superior right cerebellum SRS to right cerebellar lesion 07/30/2019 CTs 08/17/2019-previously noted right lower lobe nodule resolved, stable left kidney mass, mixed lytic/sclerotic bone lesions in the thoracolumbar spine, right posterior ribs, and left  acetabulum-unchanged, no evidence of progressive disease  Cabozantinib continued MRI brain 10/23/2019-stable to slight decrease in size of multiple enhancing intracranial lesions.  Slight increase in surrounding edema in the medial left frontal lobe.  No new lesions present. MRI brain 01/29/2020-multiple enhancing brain lesions, some hemorrhagic, some with mild enlargement-treatment effect? CTs 02/08/2020-no thoracic metastases, enlargement of the left renal mass, enlargement of lytic lesion at T9, other lytic lesions unchanged MRI brain 05/04/2020-no new lesions, slight enlargement of a right occipital lesion, other lesions are stable or decreased in size CTs 08/11/2020-enlargement of left kidney mass, new 1.6 cm segment 7 liver lesion, enlargement of a lytic lesion at T9, increased lysis of a sclerotic lesion at the right second rib, 0.7 cm endoluminal nodule at the bladder dome-enlarged Brain MRI 08/10/2020-slight increase in a 15 mm left frontal lobe lesion, new punctate focus in the right occipital cortex, other lesions stable or slightly decreased CTs 11/01/2020-increase in left suprahilar opacity, enlargement of previous liver metastases, several new subcentimeter lesions, increase in left renal mass, similar appearance of bone metastases, stable hyperdense/enhancing nodule in the left bladder Brain MRI 11/04/2020-no change in brain metastases Lenvatinib/pembrolizumab 11/30/2020 CTs 03/03/2021-stable left hilar lymph nodes; resolution of left upper lobe perihilar nodularity; unchanged for millimeter peripheral right upper lobe nodule; multiple lytic bone lesions appear stable; left kidney lesion decreased in size; bilateral adrenal nodules decreased in size; several liver lesion showed mild increase in size; several new small lesions within the right hepatic lobe. Lenvatinib/pembrolizumab continued 03/07/2021 Brain MRI 03/10/2021-progression of a left frontal metastasis, tiny new metastasis in the right frontal  lobe-referred for SBRT to the right frontal lesion and to [facility] to consider a LITT procedure for the progressive left frontal lesion SRS 04/06/2021, LITT procedure at [facility] 04/18/2021-pathology metastatic renal cell carcinoma in background of necrosis/gliosis Lenvatinib on hold Pembrolizumab held 04/24/2021 Pembrolizumab 05/01/2021, lenvatinib remains on hold pending surgical evaluation Pembrolizumab 05/25/2021, lenvatinib resumed Pembrolizumab 06/15/2021 Lenvima resumed 06/21/2021 CTs 07/28/2021-continued regression left renal lesion.  Much improved appearance of the  liver.  Continued regression of bilateral adrenal gland nodules.  Stable mixed lytic and sclerotic metastatic bone disease.  No new or progressive findings. Pembrolizumab 07/31/2021, lenvatinib continued Pembrolizumab held 08/21/2021 due to generalized weakness Pembrolizumab resumed 09/11/2021 MRI brain 10/19/2021-"new "focus of minimal enhancement of the right frontal lobe, larger punctate right cerebellar lesion, review of MRI found the right frontal lesion was present in 2021 and was treated with SRS CTs 11/12/2021-2 new right upper lung nodules, stable left renal lesion, enlarging lytic lesion in the left fourth rib Pembrolizumab and lenvatinib continued   Pain secondary to #1-managed by Dr. Lovenia Shuck.  Improved Hypertension Elevated transaminases 02/06/2016- Pazopanib placed on hold Liver enzymes normal 03/07/2016 Port-A-Cath placement 05/16/2016 Malaise/anorexia 09/05/2016. Cortisol and testosterone levels low. Hydrocortisone and testosterone replacement initiated. Conjunctival/scleral erythema 09/19/2016-resolved with steroid eyedrops Proximal right leg weakness. 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 Hypercalcemia status post Zometa 01/23/2017-resolved Pruritic rash following IV contrast 10/21/2017; rash following IV contrast 02/21/2018 despite prednisone/Benadryl premedication Brief episodes of  expressive aphasia October and December 2020 Vitamin B12 deficiency confirmed on lab 03/07/2021-started oral vitamin B12 replacement 03/28/2021      Disposition: Phillip [name] appears stable.  No clinical evidence of disease progression.  Plan to continue Pembrolizumab and lenvatinib.  CBC and chemistry panel reviewed.  Labs adequate to proceed as above.  He will return for an office visit in 3 weeks.    [name] ANP/GNP-BC   12/27/2021  10:51 AM

## 2021-12-27 NOTE — Patient Instructions (Signed)
[facility]   Discharge Instructions: Thank you for choosing [facility] to provide your oncology and hematology care.   If you have a lab appointment with the Cancer Center, please go directly to the Cancer Center and check in at the registration area.   Wear comfortable clothing and clothing appropriate for easy access to any Portacath or PICC line.   We strive to give you quality time with your provider. You may need to reschedule your appointment if you arrive late (15 or more minutes).  Arriving late affects you and other patients whose appointments are after yours.  Also, if you miss three or more appointments without notifying the office, you may be dismissed from the clinic at the provider's discretion.      For prescription refill requests, have your pharmacy contact our office and allow 72 hours for refills to be completed.    Today you received the following chemotherapy and/or immunotherapy agents Keytruda      To help prevent nausea and vomiting after your treatment, we encourage you to take your nausea medication as directed.  BELOW ARE SYMPTOMS THAT SHOULD BE REPORTED IMMEDIATELY: *FEVER GREATER THAN 100.4 F (38 C) OR HIGHER *CHILLS OR SWEATING *NAUSEA AND VOMITING THAT IS NOT CONTROLLED WITH YOUR NAUSEA MEDICATION *UNUSUAL SHORTNESS OF BREATH *UNUSUAL BRUISING OR BLEEDING *URINARY PROBLEMS (pain or burning when urinating, or frequent urination) *BOWEL PROBLEMS (unusual diarrhea, constipation, pain near the anus) TENDERNESS IN MOUTH AND THROAT WITH OR WITHOUT PRESENCE OF ULCERS (sore throat, sores in mouth, or a toothache) UNUSUAL RASH, SWELLING OR PAIN  UNUSUAL VAGINAL DISCHARGE OR ITCHING   Items with * indicate a potential emergency and should be followed up as soon as possible or go to the Emergency Department if any problems should occur.  Please show the CHEMOTHERAPY ALERT CARD or IMMUNOTHERAPY ALERT CARD at check-in to the  Emergency Department and triage nurse.  Should you have questions after your visit or need to cancel or reschedule your appointment, please contact [facility]  Dept: [phone]  and follow the prompts.  Office hours are 8:00 a.m. to 4:30 p.m. Monday - Friday. Please note that voicemails left after 4:00 p.m. may not be returned until the following business day.  We are closed weekends and major holidays. You have access to a nurse at all times for urgent questions. Please call the main number to the clinic Dept: [phone] and follow the prompts.   For any non-urgent questions, you may also contact your provider using MyChart. We now offer e-Visits for anyone 18 and older to request care online for non-urgent symptoms. For details visit [email or web address].   Also download the MyChart app! Go to the app store, search "MyChart", open the app, select [facility], and log in with your MyChart username and password.  Masks are optional in the cancer centers. If you would like for your care team to wear a mask while they are taking care of you, please let them know. You may have one support person who is at least 77 years old accompany you for your appointments.  Pembrolizumab Injection What is this medication? PEMBROLIZUMAB (PEM broe LIZ ue mab) treats some types of cancer. It works by helping your immune system slow or stop the spread of cancer cells. It is a monoclonal antibody. This medicine may be used for other purposes; ask your health care provider or pharmacist if you have questions. COMMON BRAND NAME(S): Keytruda   What should I tell my care team before I take this medication? They need to know if you have any of these conditions: Allogeneic stem cell transplant (uses someone else's stem cells) Autoimmune diseases, such as Crohn disease, ulcerative colitis, lupus History of chest radiation Nervous system problems, such as Guillain-Barre syndrome, myasthenia  gravis Organ transplant An unusual or allergic reaction to pembrolizumab, other medications, foods, dyes, or preservatives Pregnant or trying to get pregnant Breast-feeding How should I use this medication? This medication is injected into a vein. It is given by your care team in a hospital or clinic setting. A special MedGuide will be given to you before each treatment. Be sure to read this information carefully each time. Talk to your care team about the use of this medication in children. While it may be prescribed for children as young as 6 months for selected conditions, precautions do apply. Overdosage: If you think you have taken too much of this medicine contact a poison control center or emergency room at once. NOTE: This medicine is only for you. Do not share this medicine with others. What if I miss a dose? Keep appointments for follow-up doses. It is important not to miss your dose. Call your care team if you are unable to keep an appointment. What may interact with this medication? Interactions have not been studied. This list may not describe all possible interactions. Give your health care provider a list of all the medicines, herbs, non-prescription drugs, or dietary supplements you use. Also tell them if you smoke, drink alcohol, or use illegal drugs. Some items may interact with your medicine. What should I watch for while using this medication? Your condition will be monitored carefully while you are receiving this medication. You may need blood work while taking this medication. This medication may cause serious skin reactions. They can happen weeks to months after starting the medication. Contact your care team right away if you notice fevers or flu-like symptoms with a rash. The rash may be red or purple and then turn into blisters or peeling of the skin. You may also notice a red rash with swelling of the face, lips, or lymph nodes in your neck or under your arms. Tell your  care team right away if you have any change in your eyesight. Talk to your care team if you may be pregnant. Serious birth defects can occur if you take this medication during pregnancy and for 4 months after the last dose. You will need a negative pregnancy test before starting this medication. Contraception is recommended while taking this medication and for 4 months after the last dose. Your care team can help you find the option that works for you. Do not breastfeed while taking this medication and for 4 months after the last dose. What side effects may I notice from receiving this medication? Side effects that you should report to your care team as soon as possible: Allergic reactions--skin rash, itching, hives, swelling of the face, lips, tongue, or throat Dry cough, shortness of breath or trouble breathing Eye pain, redness, irritation, or discharge with blurry or decreased vision Heart muscle inflammation--unusual weakness or fatigue, shortness of breath, chest pain, fast or irregular heartbeat, dizziness, swelling of the ankles, feet, or hands Hormone gland problems--headache, sensitivity to light, unusual weakness or fatigue, dizziness, fast or irregular heartbeat, increased sensitivity to cold or heat, excessive sweating, constipation, hair loss, increased thirst or amount of urine, tremors or shaking, irritability Infusion reactions--chest pain, shortness of   breath or trouble breathing, feeling faint or lightheaded Kidney injury (glomerulonephritis)--decrease in the amount of urine, red or dark brown urine, foamy or bubbly urine, swelling of the ankles, hands, or feet Liver injury--right upper belly pain, loss of appetite, nausea, light-colored stool, dark yellow or brown urine, yellowing skin or eyes, unusual weakness or fatigue Pain, tingling, or numbness in the hands or feet, muscle weakness, change in vision, confusion or trouble speaking, loss of balance or coordination, trouble  walking, seizures Rash, fever, and swollen lymph nodes Redness, blistering, peeling, or loosening of the skin, including inside the mouth Sudden or severe stomach pain, bloody diarrhea, fever, nausea, vomiting Side effects that usually do not require medical attention (report to your care team if they continue or are bothersome): Bone, joint, or muscle pain Diarrhea Fatigue Loss of appetite Nausea Skin rash This list may not describe all possible side effects. Call your doctor for medical advice about side effects. You may report side effects to FDA at 1-800-FDA-1088. Where should I keep my medication? This medication is given in a hospital or clinic. It will not be stored at home. NOTE: This sheet is a summary. It may not cover all possible information. If you have questions about this medicine, talk to your doctor, pharmacist, or health care provider.  2023 Elsevier/Gold Standard (2012-11-03 00:00:00) 

## 2021-12-28 ENCOUNTER — Other Ambulatory Visit: Payer: Self-pay

## 2021-12-28 ENCOUNTER — Other Ambulatory Visit (HOSPITAL_COMMUNITY): Payer: Self-pay

## 2021-12-28 ENCOUNTER — Other Ambulatory Visit: Payer: Self-pay | Admitting: Nurse Practitioner

## 2021-12-28 DIAGNOSIS — C649 Malignant neoplasm of unspecified kidney, except renal pelvis: Secondary | ICD-10-CM

## 2021-12-28 MED ORDER — OXYCODONE HCL 5 MG PO TABS
5.0000 mg | ORAL_TABLET | Freq: Four times a day (QID) | ORAL | 0 refills | Status: DC | PRN
  Filled 2021-12-28: qty 60, 15d supply, fill #0

## 2021-12-28 MED ORDER — OXYCODONE HCL ER 10 MG PO T12A
EXTENDED_RELEASE_TABLET | Freq: Two times a day (BID) | ORAL | 0 refills | Status: DC
  Filled 2021-12-28: qty 120, 30d supply, fill #0

## 2021-12-29 ENCOUNTER — Other Ambulatory Visit (HOSPITAL_COMMUNITY): Payer: Self-pay

## 2021-12-29 ENCOUNTER — Other Ambulatory Visit: Payer: Self-pay | Admitting: Nurse Practitioner

## 2021-12-29 ENCOUNTER — Other Ambulatory Visit: Payer: Self-pay | Admitting: Oncology

## 2021-12-29 DIAGNOSIS — C649 Malignant neoplasm of unspecified kidney, except renal pelvis: Secondary | ICD-10-CM

## 2021-12-29 MED ORDER — HYDROCORTISONE 10 MG PO TABS
ORAL_TABLET | ORAL | 3 refills | Status: DC
  Filled 2021-12-29: qty 90, 30d supply, fill #0
  Filled 2022-01-29: qty 90, 30d supply, fill #1

## 2021-12-29 MED ORDER — LOSARTAN POTASSIUM-HCTZ 100-12.5 MG PO TABS
1.0000 | ORAL_TABLET | Freq: Every day | ORAL | 3 refills | Status: DC
  Filled 2021-12-29: qty 90, 90d supply, fill #0

## 2021-12-29 MED ORDER — MIRTAZAPINE 30 MG PO TABS
30.0000 mg | ORAL_TABLET | Freq: Every evening | ORAL | 5 refills | Status: DC
  Filled 2021-12-29: qty 90, 90d supply, fill #0

## 2021-12-30 ENCOUNTER — Other Ambulatory Visit (HOSPITAL_COMMUNITY): Payer: Self-pay

## 2022-01-01 ENCOUNTER — Other Ambulatory Visit: Payer: Self-pay | Admitting: *Deleted

## 2022-01-01 ENCOUNTER — Other Ambulatory Visit: Payer: Self-pay

## 2022-01-01 ENCOUNTER — Telehealth: Payer: Self-pay | Admitting: *Deleted

## 2022-01-01 DIAGNOSIS — C7931 Secondary malignant neoplasm of brain: Secondary | ICD-10-CM

## 2022-01-01 NOTE — Telephone Encounter (Signed)
Wife called to report that patient is having a harder time doing normal things Example: not knowing how to work it where as he previously had no issues previously.  This is a slowly progressive issue that she is concerned about.  She reports that he is extremely lethargic and hard to arouse and keep awake.  He is due for MRI December 1st.  Concerned about changes.  Routing to Dr Mickeal Skinner to see if we should move up MRI to first available.

## 2022-01-02 ENCOUNTER — Ambulatory Visit (HOSPITAL_COMMUNITY)
Admission: RE | Admit: 2022-01-02 | Discharge: 2022-01-02 | Disposition: A | Discharge status: Home / Self Care | Payer: PPO | Source: Ambulatory Visit | Attending: Internal Medicine | Admitting: Internal Medicine

## 2022-01-02 ENCOUNTER — Other Ambulatory Visit (HOSPITAL_COMMUNITY): Payer: Self-pay

## 2022-01-02 ENCOUNTER — Other Ambulatory Visit: Payer: Self-pay

## 2022-01-02 DIAGNOSIS — C7931 Secondary malignant neoplasm of brain: Secondary | ICD-10-CM | POA: Diagnosis not present

## 2022-01-02 DIAGNOSIS — G9389 Other specified disorders of brain: Secondary | ICD-10-CM | POA: Diagnosis not present

## 2022-01-02 MED ORDER — HEPARIN SOD (PORK) LOCK FLUSH 100 UNIT/ML IV SOLN: 500.0000 [IU] | Freq: Once | INTRAVENOUS | Status: DC

## 2022-01-02 MED ORDER — GADOBUTROL 1 MMOL/ML IV SOLN
10.0000 mL | Freq: Once | INTRAVENOUS | Status: AC | PRN
  Administered 2022-01-02: 10 mL via INTRAVENOUS

## 2022-01-02 MED ORDER — HEPARIN SOD (PORK) LOCK FLUSH 100 UNIT/ML IV SOLN
500.0000 [IU] | INTRAVENOUS | Status: AC | PRN
  Administered 2022-01-02: 500 [IU]

## 2022-01-04 ENCOUNTER — Other Ambulatory Visit (HOSPITAL_COMMUNITY): Payer: Self-pay

## 2022-01-06 ENCOUNTER — Other Ambulatory Visit (HOSPITAL_COMMUNITY): Payer: PPO

## 2022-01-08 ENCOUNTER — Other Ambulatory Visit: Payer: Self-pay

## 2022-01-08 ENCOUNTER — Emergency Department (HOSPITAL_COMMUNITY): Payer: PPO

## 2022-01-08 ENCOUNTER — Inpatient Hospital Stay (HOSPITAL_COMMUNITY)
Admission: EM | Admit: 2022-01-08 | Discharge: 2022-01-12 | DRG: 871 | Disposition: A | Discharge status: Home Health | Payer: PPO | Attending: Internal Medicine | Admitting: Internal Medicine

## 2022-01-08 ENCOUNTER — Other Ambulatory Visit (HOSPITAL_COMMUNITY): Payer: Self-pay

## 2022-01-08 ENCOUNTER — Inpatient Hospital Stay: Payer: PPO

## 2022-01-08 ENCOUNTER — Ambulatory Visit: Payer: PPO | Admitting: Internal Medicine

## 2022-01-08 ENCOUNTER — Encounter (HOSPITAL_COMMUNITY): Payer: Self-pay

## 2022-01-08 DIAGNOSIS — C649 Malignant neoplasm of unspecified kidney, except renal pelvis: Secondary | ICD-10-CM | POA: Diagnosis not present

## 2022-01-08 DIAGNOSIS — R1084 Generalized abdominal pain: Secondary | ICD-10-CM | POA: Diagnosis not present

## 2022-01-08 DIAGNOSIS — K8000 Calculus of gallbladder with acute cholecystitis without obstruction: Secondary | ICD-10-CM | POA: Diagnosis not present

## 2022-01-08 DIAGNOSIS — Z91041 Radiographic dye allergy status: Secondary | ICD-10-CM | POA: Diagnosis not present

## 2022-01-08 DIAGNOSIS — N189 Chronic kidney disease, unspecified: Secondary | ICD-10-CM | POA: Diagnosis present

## 2022-01-08 DIAGNOSIS — E538 Deficiency of other specified B group vitamins: Secondary | ICD-10-CM | POA: Diagnosis not present

## 2022-01-08 DIAGNOSIS — G8929 Other chronic pain: Secondary | ICD-10-CM | POA: Diagnosis not present

## 2022-01-08 DIAGNOSIS — K760 Fatty (change of) liver, not elsewhere classified: Secondary | ICD-10-CM | POA: Diagnosis present

## 2022-01-08 DIAGNOSIS — I700 Atherosclerosis of aorta: Secondary | ICD-10-CM | POA: Diagnosis present

## 2022-01-08 DIAGNOSIS — F32A Depression, unspecified: Secondary | ICD-10-CM | POA: Diagnosis present

## 2022-01-08 DIAGNOSIS — Z85528 Personal history of other malignant neoplasm of kidney: Secondary | ICD-10-CM | POA: Diagnosis not present

## 2022-01-08 DIAGNOSIS — Z434 Encounter for attention to other artificial openings of digestive tract: Secondary | ICD-10-CM | POA: Diagnosis not present

## 2022-01-08 DIAGNOSIS — R531 Weakness: Secondary | ICD-10-CM | POA: Diagnosis not present

## 2022-01-08 DIAGNOSIS — E860 Dehydration: Secondary | ICD-10-CM | POA: Diagnosis present

## 2022-01-08 DIAGNOSIS — Z9359 Other cystostomy status: Secondary | ICD-10-CM | POA: Diagnosis not present

## 2022-01-08 DIAGNOSIS — E2740 Unspecified adrenocortical insufficiency: Secondary | ICD-10-CM | POA: Diagnosis present

## 2022-01-08 DIAGNOSIS — D630 Anemia in neoplastic disease: Secondary | ICD-10-CM | POA: Diagnosis not present

## 2022-01-08 DIAGNOSIS — G9341 Metabolic encephalopathy: Secondary | ICD-10-CM | POA: Diagnosis present

## 2022-01-08 DIAGNOSIS — C7931 Secondary malignant neoplasm of brain: Secondary | ICD-10-CM | POA: Diagnosis not present

## 2022-01-08 DIAGNOSIS — F419 Anxiety disorder, unspecified: Secondary | ICD-10-CM | POA: Diagnosis present

## 2022-01-08 DIAGNOSIS — K7689 Other specified diseases of liver: Secondary | ICD-10-CM | POA: Diagnosis not present

## 2022-01-08 DIAGNOSIS — R197 Diarrhea, unspecified: Secondary | ICD-10-CM | POA: Diagnosis not present

## 2022-01-08 DIAGNOSIS — R9431 Abnormal electrocardiogram [ECG] [EKG]: Secondary | ICD-10-CM | POA: Diagnosis not present

## 2022-01-08 DIAGNOSIS — Z66 Do not resuscitate: Secondary | ICD-10-CM | POA: Diagnosis present

## 2022-01-08 DIAGNOSIS — K810 Acute cholecystitis: Principal | ICD-10-CM

## 2022-01-08 DIAGNOSIS — Z79899 Other long term (current) drug therapy: Secondary | ICD-10-CM | POA: Diagnosis not present

## 2022-01-08 DIAGNOSIS — I129 Hypertensive chronic kidney disease with stage 1 through stage 4 chronic kidney disease, or unspecified chronic kidney disease: Secondary | ICD-10-CM | POA: Diagnosis not present

## 2022-01-08 DIAGNOSIS — I10 Essential (primary) hypertension: Secondary | ICD-10-CM | POA: Diagnosis present

## 2022-01-08 DIAGNOSIS — R6510 Systemic inflammatory response syndrome (SIRS) of non-infectious origin without acute organ dysfunction: Secondary | ICD-10-CM | POA: Insufficient documentation

## 2022-01-08 DIAGNOSIS — Z7952 Long term (current) use of systemic steroids: Secondary | ICD-10-CM | POA: Diagnosis not present

## 2022-01-08 DIAGNOSIS — A419 Sepsis, unspecified organism: Secondary | ICD-10-CM | POA: Diagnosis not present

## 2022-01-08 DIAGNOSIS — E876 Hypokalemia: Secondary | ICD-10-CM | POA: Diagnosis present

## 2022-01-08 DIAGNOSIS — C7951 Secondary malignant neoplasm of bone: Secondary | ICD-10-CM | POA: Diagnosis present

## 2022-01-08 DIAGNOSIS — R918 Other nonspecific abnormal finding of lung field: Secondary | ICD-10-CM | POA: Diagnosis not present

## 2022-01-08 DIAGNOSIS — N179 Acute kidney failure, unspecified: Secondary | ICD-10-CM | POA: Diagnosis present

## 2022-01-08 DIAGNOSIS — Z807 Family history of other malignant neoplasms of lymphoid, hematopoietic and related tissues: Secondary | ICD-10-CM | POA: Diagnosis not present

## 2022-01-08 DIAGNOSIS — K8020 Calculus of gallbladder without cholecystitis without obstruction: Secondary | ICD-10-CM | POA: Diagnosis not present

## 2022-01-08 DIAGNOSIS — G40909 Epilepsy, unspecified, not intractable, without status epilepticus: Secondary | ICD-10-CM

## 2022-01-08 DIAGNOSIS — R000 Tachycardia, unspecified: Secondary | ICD-10-CM | POA: Diagnosis not present

## 2022-01-08 DIAGNOSIS — C7949 Secondary malignant neoplasm of other parts of nervous system: Secondary | ICD-10-CM | POA: Diagnosis present

## 2022-01-08 DIAGNOSIS — C642 Malignant neoplasm of left kidney, except renal pelvis: Secondary | ICD-10-CM | POA: Diagnosis not present

## 2022-01-08 DIAGNOSIS — R1011 Right upper quadrant pain: Secondary | ICD-10-CM | POA: Diagnosis not present

## 2022-01-08 DIAGNOSIS — R110 Nausea: Secondary | ICD-10-CM | POA: Diagnosis not present

## 2022-01-08 DIAGNOSIS — R0902 Hypoxemia: Secondary | ICD-10-CM | POA: Diagnosis not present

## 2022-01-08 DIAGNOSIS — K828 Other specified diseases of gallbladder: Secondary | ICD-10-CM | POA: Diagnosis not present

## 2022-01-08 LAB — CBC WITH DIFFERENTIAL/PLATELET
Abs Immature Granulocytes: 0.07 10*3/uL (ref 0.00–0.07)
Basophils Absolute: 0.1 10*3/uL (ref 0.0–0.1)
Basophils Relative: 0 %
Eosinophils Absolute: 0.5 10*3/uL (ref 0.0–0.5)
Eosinophils Relative: 3 %
HCT: 42.6 % (ref 39.0–52.0)
Hemoglobin: 13.2 g/dL (ref 13.0–17.0)
Immature Granulocytes: 0 %
Lymphocytes Relative: 22 %
Lymphs Abs: 3.7 10*3/uL (ref 0.7–4.0)
MCH: 26.2 pg (ref 26.0–34.0)
MCHC: 31 g/dL (ref 30.0–36.0)
MCV: 84.5 fL (ref 80.0–100.0)
Monocytes Absolute: 2 10*3/uL — ABNORMAL HIGH (ref 0.1–1.0)
Monocytes Relative: 12 %
Neutro Abs: 10.2 10*3/uL — ABNORMAL HIGH (ref 1.7–7.7)
Neutrophils Relative %: 63 %
Platelets: 357 10*3/uL (ref 150–400)
RBC: 5.04 MIL/uL (ref 4.22–5.81)
RDW: 17.1 % — ABNORMAL HIGH (ref 11.5–15.5)
WBC: 16.5 10*3/uL — ABNORMAL HIGH (ref 4.0–10.5)
nRBC: 0 % (ref 0.0–0.2)

## 2022-01-08 LAB — COMPREHENSIVE METABOLIC PANEL
ALT: 13 U/L (ref 0–44)
AST: 29 U/L (ref 15–41)
Albumin: 2.9 g/dL — ABNORMAL LOW (ref 3.5–5.0)
Alkaline Phosphatase: 79 U/L (ref 38–126)
Anion gap: 16 — ABNORMAL HIGH (ref 5–15)
BUN: 18 mg/dL (ref 8–23)
CO2: 25 mmol/L (ref 22–32)
Calcium: 8.4 mg/dL — ABNORMAL LOW (ref 8.9–10.3)
Chloride: 96 mmol/L — ABNORMAL LOW (ref 98–111)
Creatinine, Ser: 1.5 mg/dL — ABNORMAL HIGH (ref 0.61–1.24)
GFR, Estimated: 48 mL/min — ABNORMAL LOW (ref >60)
Glucose, Bld: 156 mg/dL — ABNORMAL HIGH (ref 70–99)
Potassium: 4.5 mmol/L (ref 3.5–5.1)
Sodium: 137 mmol/L (ref 135–145)
Total Bilirubin: 1.6 mg/dL — ABNORMAL HIGH (ref 0.3–1.2)
Total Protein: 6.9 g/dL (ref 6.5–8.1)

## 2022-01-08 LAB — LACTIC ACID, PLASMA
Lactic Acid, Venous: 2.3 mmol/L (ref 0.5–1.9)
Lactic Acid, Venous: 2 mmol/L (ref 0.5–1.9)

## 2022-01-08 LAB — LIPASE, BLOOD: Lipase: 22 U/L (ref 11–51)

## 2022-01-08 LAB — PROTIME-INR
INR: 1.3 — ABNORMAL HIGH (ref 0.8–1.2)
Prothrombin Time: 15.7 seconds — ABNORMAL HIGH (ref 11.4–15.2)

## 2022-01-08 LAB — CBG MONITORING, ED: Glucose-Capillary: 167 mg/dL — ABNORMAL HIGH (ref 70–99)

## 2022-01-08 MED ORDER — MORPHINE SULFATE (PF) 4 MG/ML IV SOLN
4.0000 mg | Freq: Once | INTRAVENOUS | Status: AC
  Administered 2022-01-08: 4 mg via INTRAVENOUS
  Filled 2022-01-08: qty 1

## 2022-01-08 MED ORDER — HYDROCORTISONE SOD SUC (PF) 100 MG IJ SOLR
30.0000 mg | Freq: Every day | INTRAMUSCULAR | Status: DC
  Administered 2022-01-08 – 2022-01-12 (×5): 30 mg via INTRAVENOUS
  Filled 2022-01-08 (×5): qty 0.6

## 2022-01-08 MED ORDER — ACETAMINOPHEN 325 MG PO TABS
650.0000 mg | ORAL_TABLET | Freq: Four times a day (QID) | ORAL | Status: DC | PRN
  Administered 2022-01-09: 650 mg via ORAL
  Filled 2022-01-08: qty 2

## 2022-01-08 MED ORDER — PIPERACILLIN-TAZOBACTAM 3.375 G IVPB
3.3750 g | Freq: Three times a day (TID) | INTRAVENOUS | Status: DC
  Administered 2022-01-08 – 2022-01-12 (×11): 3.375 g via INTRAVENOUS
  Filled 2022-01-08 (×11): qty 50

## 2022-01-08 MED ORDER — PREDNISONE 50 MG PO TABS
50.0000 mg | ORAL_TABLET | Freq: Four times a day (QID) | ORAL | Status: AC
  Administered 2022-01-09: 50 mg via ORAL
  Filled 2022-01-08 (×2): qty 1

## 2022-01-08 MED ORDER — HYDRALAZINE HCL 20 MG/ML IJ SOLN
10.0000 mg | Freq: Three times a day (TID) | INTRAMUSCULAR | Status: DC | PRN
  Administered 2022-01-11: 10 mg via INTRAVENOUS
  Filled 2022-01-08: qty 1

## 2022-01-08 MED ORDER — ONDANSETRON HCL 4 MG/2ML IJ SOLN: 4.0000 mg | Freq: Four times a day (QID) | INTRAMUSCULAR | Status: DC | PRN

## 2022-01-08 MED ORDER — LACTATED RINGERS IV BOLUS
1000.0000 mL | Freq: Once | INTRAVENOUS | Status: AC
  Administered 2022-01-08: 1000 mL via INTRAVENOUS

## 2022-01-08 MED ORDER — PIPERACILLIN-TAZOBACTAM 3.375 G IVPB 30 MIN
3.3750 g | Freq: Once | INTRAVENOUS | Status: AC
  Administered 2022-01-08: 3.375 g via INTRAVENOUS
  Filled 2022-01-08: qty 50

## 2022-01-08 MED ORDER — SODIUM CHLORIDE 0.9% FLUSH
10.0000 mL | Freq: Two times a day (BID) | INTRAVENOUS | Status: DC
  Administered 2022-01-10 – 2022-01-11 (×2): 10 mL

## 2022-01-08 MED ORDER — DIPHENHYDRAMINE HCL 25 MG PO CAPS: 50.0000 mg | ORAL_CAPSULE | Freq: Once | ORAL | Status: DC

## 2022-01-08 MED ORDER — ACETAMINOPHEN 650 MG RE SUPP: 650.0000 mg | Freq: Four times a day (QID) | RECTAL | Status: DC | PRN

## 2022-01-08 MED ORDER — DORZOLAMIDE HCL 2 % OP SOLN
1.0000 [drp] | Freq: Three times a day (TID) | OPHTHALMIC | Status: DC
  Administered 2022-01-08 – 2022-01-12 (×10): 1 [drp] via OPHTHALMIC
  Filled 2022-01-08: qty 10

## 2022-01-08 MED ORDER — LACTATED RINGERS IV SOLN: INTRAVENOUS | Status: DC

## 2022-01-08 MED ORDER — ONDANSETRON HCL 4 MG/2ML IJ SOLN
4.0000 mg | Freq: Once | INTRAMUSCULAR | Status: AC
  Administered 2022-01-08: 4 mg via INTRAVENOUS
  Filled 2022-01-08: qty 2

## 2022-01-08 MED ORDER — ONDANSETRON HCL 4 MG PO TABS: 4.0000 mg | ORAL_TABLET | Freq: Four times a day (QID) | ORAL | Status: DC | PRN

## 2022-01-08 MED ORDER — HYDROCORTISONE SOD SUC (PF) 100 MG IJ SOLR
30.0000 mg | Freq: Every day | INTRAMUSCULAR | Status: DC
  Filled 2022-01-08: qty 0.6

## 2022-01-08 MED ORDER — SODIUM CHLORIDE 0.9 % IV SOLN
250.0000 mg | Freq: Two times a day (BID) | INTRAVENOUS | Status: DC
  Administered 2022-01-08 – 2022-01-11 (×6): 250 mg via INTRAVENOUS
  Filled 2022-01-08 (×9): qty 2.5

## 2022-01-08 MED ORDER — MORPHINE SULFATE (PF) 2 MG/ML IV SOLN
2.0000 mg | INTRAVENOUS | Status: DC | PRN
  Administered 2022-01-08 – 2022-01-10 (×3): 2 mg via INTRAVENOUS
  Filled 2022-01-08 (×3): qty 1

## 2022-01-08 MED ORDER — CHLORHEXIDINE GLUCONATE CLOTH 2 % EX PADS
6.0000 | MEDICATED_PAD | Freq: Every day | CUTANEOUS | Status: DC
  Administered 2022-01-09 – 2022-01-12 (×4): 6 via TOPICAL

## 2022-01-08 NOTE — ED Triage Notes (Signed)
Per EMS- Patient is a cancer patient. Patient is currently receiving chemo. Patient has had increased weakness, abdominal pain, and N/D. Wife reports that the patient normally walks with a walker, but is unable to do so x 2 days due to weakness. Patient was placed on O2 2L/min via [place] due to room air sats in the 80's.

## 2022-01-08 NOTE — ED Provider Notes (Addendum)
[facility] DEPT Provider Note   CSN: [id number] Arrival date & time: 01/08/22  0901     History  Chief Complaint  Patient presents with   Weakness    Phillip [name] is a 76 y.o. male.  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  Patient's wife reports that on 01/02/2022 he had an MRI of his brain that showed that he did have a new brain met and they were planning on following up with Dr. Mickeal Skinner today.  In the last month he has had some progressive speech issues and has chronic right-sided weakness.  However the abdominal discomfort, poor oral intake has been within the last 2-1/2 days.  He has not had cough, congestion or vomiting.  He has not had diarrhea or fever.  He has not had prior abdominal surgeries but did have a brain surgery at [facility] in the past.  She feels that his speech is a little bit worse in the last few days but it has been a progressive problem.  The history is provided by the spouse, the EMS personnel and medical records. The history is limited by the condition of the patient.  Weakness      Home Medications Prior to Admission medications   Medication Sig Start Date End Date Taking? Authorizing Provider  acetaminophen (TYLENOL) 650 MG CR tablet Take 650 mg by mouth every 8 (eight) hours as needed for pain.    [provider]  COVID-19 mRNA vaccine [phone] (COMIRNATY) syringe Inject into the muscle. 12/27/21   Carlyle Basques, MD  Cyanocobalamin (VITAMIN B12) 1000 MCG TBCR Take 1,000 mcg by mouth daily.    [provider]  docusate sodium (COLACE) 100 MG capsule Take 100 mg by mouth 2 (two) times daily.    [provider]   dorzolamide (TRUSOPT) 2 % ophthalmic solution Instill 1 drop into right eye three times daily. 10/27/21     famotidine (PEPCID AC) 10 MG tablet 10m    [provider]  hydrocortisone (CORTEF) 10 MG tablet Take 2 tablets (20 mg total) by mouth in the morning AND 1 tablet (10 mg total) every evening. 12/29/21   SLadell Pier MD  ibuprofen (ADVIL,MOTRIN) 200 MG tablet Take 200 mg by mouth every 8 (eight) hours as needed for fever, headache, mild pain, moderate pain or cramping.     [provider]  lenvatinib 18 mg daily dose (LENVIMA, 18 MG DAILY DOSE,) 10 MG & 2 x 4 MG capsule Take 18 mg by mouth daily. 11/30/21   SLadell Pier MD  levETIRAcetam (KEPPRA) 250 MG tablet Take 250 mg by mouth 2 (two) times daily.    [provider]  lidocaine-prilocaine (EMLA) cream APPLY TO PORTACATH 1 HOUR PRIOR TO USE AS NEEDED 11/13/21   SLadell Pier MD  LORazepam (ATIVAN) 0.5 MG tablet Take 1-2 tablets (0.5-1 mg total) by mouth every 6 (six) hours as needed for anxiety. Patient not taking: Reported on 11/13/2021 03/26/19   SLadell Pier MD  losartan-hydrochlorothiazide ([facility] 100-12.5 MG tablet Take 1 tablet by mouth once a day. 12/29/21     mirtazapine (REMERON) 30 MG tablet Take 1 tablet (30 mg total) by mouth at bedtime. 12/29/21  [name], MD  Multiple Vitamin (MULTIVITAMIN ADULT PO) Take 1 tablet by mouth daily. [place] product Patient not taking: Reported on 12/05/2021    [provider]  ondansetron (ZOFRAN) 8 MG tablet TAKE 1 TABLET BY MOUTH EVERY 8 HOURS AS NEEDED FOR NAUSEA AND VOMITING 11/03/21   Owens Shark, NP  oxyCODONE (OXY IR/ROXICODONE) 5 MG immediate release tablet Take 1 tablet by mouth every 6 hours as needed for severe pain. 12/28/21   Owens Shark, NP  oxyCODONE (OXYCONTIN) 10 mg 12 hr tablet TAKE 2 TABLETS BY MOUTH EVERY 12 HOURS. 12/28/21   Owens Shark, NP  polyethylene glycol (MIRALAX / GLYCOLAX) 17 g packet Take 17 g by mouth  daily.    [provider]  predniSONE (DELTASONE) 50 MG tablet Take 1  tablet by mouth 13 hours, 7 hours, and 1 hour prior to CT scan. Patient not taking: Reported on 11/13/2021 11/06/21   [name], MD  prochlorperazine (COMPAZINE) 10 MG tablet Take 1 tablet by mouth every 6 hours as needed for nausea or vomiting. Patient not taking: Reported on 11/13/2021 01/04/21   Owens Shark, NP  Testosterone 1.62 % GEL Apply 2 pumps topically daily 07/04/21   Owens Shark, NP      Allergies    Contrast media [iodinated contrast media]    Review of Systems   Review of Systems  Neurological:  Positive for weakness.    Physical Exam Updated Vital Signs BP 137/62   Pulse (!) 58   Temp 98.9 F (37.2 C) (Oral)   Resp (!) 30   Ht _0  (1.803 m)   Wt 113.4 kg   SpO2 91%   BMI 34.87 kg/m  Physical Exam Vitals and nursing note reviewed.  Constitutional:      General: He is not in acute distress.    Appearance: He is well-developed.  HENT:     Head: Normocephalic and atraumatic.     Mouth/Throat:     Mouth: Mucous membranes are dry.  Eyes:     Conjunctiva/sclera: Conjunctivae normal.     Pupils: Pupils are equal, round, and reactive to light.  Cardiovascular:     Rate and Rhythm: Normal rate and regular rhythm.     Heart sounds: No murmur heard. Pulmonary:     Effort: No respiratory distress.     Breath sounds: Normal breath sounds. No wheezing or rales.     Comments: Shallow breaths but equal Abdominal:     General: There is no distension.     Palpations: Abdomen is soft.     Tenderness: There is abdominal tenderness in the right upper quadrant. There is no guarding or rebound. Positive signs include Murphy's sign.  Musculoskeletal:        General: No tenderness. Normal range of motion.     Cervical back: Normal range of motion and neck supple.     Right lower leg: No edema.     Left lower leg: No edema.  Skin:    General: Skin is warm and dry.     Coloration:  Skin is pale.     Findings: No erythema or rash.  Neurological:     Mental Status: He is alert.     Comments: Patient is unable to tell me his name or date or time.  He tries to say something but his speech is not comprehensible.  Mumbling speech.  Brief simple commands he can follow.  Notable weakness in the  right lower extremity but bilateral upper extremities 5 out of 5 strength with no noted pronator drift.  No notable facial droop.  Extraocular movements are intact.  Psychiatric:        Behavior: Behavior normal.     ED Results / Procedures / Treatments   Labs (all labs ordered are listed, but only abnormal results are displayed) Labs Reviewed  CBC WITH DIFFERENTIAL/PLATELET - Abnormal; Notable for the following components:      Result Value   WBC 16.5 (*)    RDW 17.1 (*)    Neutro Abs 10.2 (*)    Monocytes Absolute 2.0 (*)    All other components within normal limits  COMPREHENSIVE METABOLIC PANEL - Abnormal; Notable for the following components:   Chloride 96 (*)    Glucose, Bld 156 (*)    Creatinine, Ser 1.50 (*)    Calcium 8.4 (*)    Albumin 2.9 (*)    Total Bilirubin 1.6 (*)    GFR, Estimated 48 (*)    Anion gap 16 (*)    All other components within normal limits  LACTIC ACID, PLASMA - Abnormal; Notable for the following components:   Lactic Acid, Venous 2.3 (*)    All other components within normal limits  CBG MONITORING, ED - Abnormal; Notable for the following components:   Glucose-Capillary 167 (*)    All other components within normal limits  CULTURE, BLOOD (ROUTINE X 2)  CULTURE, BLOOD (ROUTINE X 2)  LIPASE, BLOOD  LACTIC ACID, PLASMA    EKG EKG Interpretation  Date/Time:  Monday January 08 2022 09:37:04 EST Ventricular Rate:  135 PR Interval:  124 QRS Duration: 90 QT Interval:  274 QTC Calculation: 357 R Axis:   -22 Text Interpretation: Sinus tachycardia with irregular rate with frequent Premature atrial complexes Borderline left axis deviation  Repolarization abnormality, prob rate related Confirmed by Blanchie Dessert [phone]) on 01/08/2022 10:07:09 AM  Radiology US Abdomen Limited RUQ (LIVER/GB)  Result Date: 01/08/2022 CLINICAL DATA:  Right upper quadrant abdominal pain. EXAM: ULTRASOUND ABDOMEN LIMITED RIGHT UPPER QUADRANT COMPARISON:  CT of the abdomen pelvis 01/08/2022 FINDINGS: Gallbladder: Gallbladder wall thickening measures up to 6 mm. Multiple shadowing stones are present in the neck of the gallbladder. No sonographic Percell Miller sign was reported. Common bile duct: Diameter: 4.0 mm, within normal limits Liver: Liver is diffusely echogenic. No discrete lesions are present. Portal vein is patent on color Doppler imaging with normal direction of blood flow towards the liver. Other: None. IMPRESSION: 1. Cholelithiasis with gallbladder wall thickening. Findings are equivocal for acute cholecystitis. 2. Fatty infiltration of the liver. Electronically Signed   By: San Morelle M.D.   On: 01/08/2022 12:04   CT Chest Wo Contrast  Result Date: 01/08/2022 CLINICAL DATA:  History of renal cell carcinoma currently on treatment increased weakness, abdominal pain, nausea and vomiting EXAM: CT CHEST, ABDOMEN AND PELVIS WITHOUT CONTRAST TECHNIQUE: Multidetector CT imaging of the chest, abdomen and pelvis was performed following the standard protocol without IV contrast. RADIATION DOSE REDUCTION: This exam was performed according to the departmental dose-optimization program which includes automated exposure control, adjustment of the mA and/or kV according to patient size and/or use of iterative reconstruction technique. COMPARISON:  CT chest, abdomen, and pelvis dated 11/10/2021 FINDINGS: CT CHEST FINDINGS Cardiovascular: Right chest wall port tip terminates in the lower SVC. Normal heart size. No significant pericardial fluid/thickening. Coronary artery calcifications. Great vessels are normal in course and caliber. Aortic atherosclerosis.  Mediastinum/Nodes: thyroid gland without nodules  meeting criteria for imaging follow-up by size. Normal esophagus. No pathologically enlarged axillary, supraclavicular, mediastinal, or hilar lymph nodes. Lungs/Pleura: There is mild respiratory motion artifact. The central airways are patent. No focal consolidation. Decreased conspicuity of previously noted right upper lobe nodules measuring average 4 mm (6:41) and 3 mm (6:65). No definite new or enlarging pulmonary nodules. No pneumothorax. No pleural effusion. Musculoskeletal: Similar multifocal osseous metastases, most notably mixed lytic and sclerotic appearance of T1 and T9 and similar expansile lytic lesion in right T11 costovertebral junction. Slightly increased sclerotic component of the left 4th rib (5:187). CT ABDOMEN PELVIS FINDINGS Hepatobiliary: Similar segment 4 cyst (2:53). Previously noted segment 3 cystic focus is not well seen. No new focal hepatic lesions. No intra or extrahepatic biliary ductal dilation. Distended gallbladder containing gallstones. Similar gallbladder fundal adenomyomatosis. New mild pericholecystic stranding. Pancreas: No pancreatic ductal dilatation or surrounding inflammatory changes. Spleen: Normal in size without focal abnormality. Adrenals/Urinary Tract: No adrenal nodules. No substantial change in size of treated left upper pole lesion measuring 2.2 x 2.0 cm (2:67). Unchanged subcentimeter exophytic focus along the anterior lower pole left kidney (2:81). No new or enlarging renal masses. No calculi or hydronephrosis. No focal bladder wall thickening. Stomach/Bowel: Normal appearance of the stomach. No evidence of bowel wall thickening, distention, or inflammatory changes. Normal appendix. Vascular/Lymphatic: No significant vascular findings are present. No enlarged abdominal or pelvic lymph nodes. Reproductive: Enlarged prostate gland. Other: Increased small volume free fluid.  No fluid collection. Musculoskeletal:  Multifocal osseous metastases with increased soft tissue component of a lytic left acetabular metastasis, for example measuring 3.9 x 2.4 cm inferiorly (2:126), previously 3.2 x 2.2 cm. Unchanged compression of L1 and L4. IMPRESSION: 1. Distended gallbladder containing gallstones with new mild pericholecystic stranding, which may reflect acute cholecystitis. Recommend correlation with right upper quadrant ultrasound for further evaluation. 2. Increased small volume free fluid in the abdomen and pelvis. 3. Multifocal osseous metastases with increased soft tissue component of a lytic left acetabular metastasis and increased sclerotic component of a left 4th rib metastasis. 4. Decreased conspicuity of previously noted right upper lobe pulmonary nodules. No definite new or enlarging pulmonary nodules. 5. No substantial change in size of treated left upper pole renal lesion. 6. Aortic atherosclerosis. Aortic Atherosclerosis (ICD10-I70.0). Electronically Signed   By: Darrin Nipper M.D.   On: 01/08/2022 11:04   CT ABDOMEN PELVIS WO CONTRAST  Result Date: 01/08/2022 CLINICAL DATA:  History of renal cell carcinoma currently on treatment increased weakness, abdominal pain, nausea and vomiting EXAM: CT CHEST, ABDOMEN AND PELVIS WITHOUT CONTRAST TECHNIQUE: Multidetector CT imaging of the chest, abdomen and pelvis was performed following the standard protocol without IV contrast. RADIATION DOSE REDUCTION: This exam was performed according to the departmental dose-optimization program which includes automated exposure control, adjustment of the mA and/or kV according to patient size and/or use of iterative reconstruction technique. COMPARISON:  CT chest, abdomen, and pelvis dated 11/10/2021 FINDINGS: CT CHEST FINDINGS Cardiovascular: Right chest wall port tip terminates in the lower SVC. Normal heart size. No significant pericardial fluid/thickening. Coronary artery calcifications. Great vessels are normal in course and caliber.  Aortic atherosclerosis. Mediastinum/Nodes: thyroid gland without nodules meeting criteria for imaging follow-up by size. Normal esophagus. No pathologically enlarged axillary, supraclavicular, mediastinal, or hilar lymph nodes. Lungs/Pleura: There is mild respiratory motion artifact. The central airways are patent. No focal consolidation. Decreased conspicuity of previously noted right upper lobe nodules measuring average 4 mm (6:41) and 3 mm (6:65). No definite new or  enlarging pulmonary nodules. No pneumothorax. No pleural effusion. Musculoskeletal: Similar multifocal osseous metastases, most notably mixed lytic and sclerotic appearance of T1 and T9 and similar expansile lytic lesion in right T11 costovertebral junction. Slightly increased sclerotic component of the left 4th rib (5:187). CT ABDOMEN PELVIS FINDINGS Hepatobiliary: Similar segment 4 cyst (2:53). Previously noted segment 3 cystic focus is not well seen. No new focal hepatic lesions. No intra or extrahepatic biliary ductal dilation. Distended gallbladder containing gallstones. Similar gallbladder fundal adenomyomatosis. New mild pericholecystic stranding. Pancreas: No pancreatic ductal dilatation or surrounding inflammatory changes. Spleen: Normal in size without focal abnormality. Adrenals/Urinary Tract: No adrenal nodules. No substantial change in size of treated left upper pole lesion measuring 2.2 x 2.0 cm (2:67). Unchanged subcentimeter exophytic focus along the anterior lower pole left kidney (2:81). No new or enlarging renal masses. No calculi or hydronephrosis. No focal bladder wall thickening. Stomach/Bowel: Normal appearance of the stomach. No evidence of bowel wall thickening, distention, or inflammatory changes. Normal appendix. Vascular/Lymphatic: No significant vascular findings are present. No enlarged abdominal or pelvic lymph nodes. Reproductive: Enlarged prostate gland. Other: Increased small volume free fluid.  No fluid collection.  Musculoskeletal: Multifocal osseous metastases with increased soft tissue component of a lytic left acetabular metastasis, for example measuring 3.9 x 2.4 cm inferiorly (2:126), previously 3.2 x 2.2 cm. Unchanged compression of L1 and L4. IMPRESSION: 1. Distended gallbladder containing gallstones with new mild pericholecystic stranding, which may reflect acute cholecystitis. Recommend correlation with right upper quadrant ultrasound for further evaluation. 2. Increased small volume free fluid in the abdomen and pelvis. 3. Multifocal osseous metastases with increased soft tissue component of a lytic left acetabular metastasis and increased sclerotic component of a left 4th rib metastasis. 4. Decreased conspicuity of previously noted right upper lobe pulmonary nodules. No definite new or enlarging pulmonary nodules. 5. No substantial change in size of treated left upper pole renal lesion. 6. Aortic atherosclerosis. Aortic Atherosclerosis (ICD10-I70.0). Electronically Signed   By: Darrin Nipper M.D.   On: 01/08/2022 11:04   CT Head Wo Contrast  Result Date: 01/08/2022 CLINICAL DATA:  Worsening weakness.  Known brain metastases. EXAM: CT HEAD WITHOUT CONTRAST TECHNIQUE: Contiguous axial images were obtained from the base of the skull through the vertex without intravenous contrast. RADIATION DOSE REDUCTION: This exam was performed according to the departmental dose-optimization program which includes automated exposure control, adjustment of the mA and/or kV according to patient size and/or use of iterative reconstruction technique. COMPARISON:  Head MRI 01/02/2022 FINDINGS: Brain: There is no evidence of an acute infarct, acute intracranial hemorrhage, midline shift, or extra-axial fluid collection. Two known small metastases in the medial left frontal lobe are partially calcified. Mild-to-moderate left frontal lobe edema is similar to the prior MRI. Smaller areas of edema or gliosis in the left temporal and left  occipital lobes are unchanged. 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 There is a background of mild chronic small vessel ischemia elsewhere in the cerebral white matter. Vascular: Calcified atherosclerosis at the skull base. No hyperdense vessel. Skull: No fracture or suspicious osseous lesion. Sinuses/Orbits: Mild bilateral ethmoid air cell mucosal thickening. Clear mastoid air cells. Unremarkable orbits. Other: None. IMPRESSION: Known brain metastases without evidence of worsening edema, acute hemorrhage, or other acute intracranial abnormality. Electronically Signed   By: Logan Bores M.D.   On: 01/08/2022 10:36   DG Chest Port 1 View  Result Date: 01/08/2022 CLINICAL DATA:  76 year old male with history of hypoxia and  weakness. History of renal cell carcinoma. EXAM: PORTABLE CHEST 1 VIEW COMPARISON:  No prior chest x-ray.  Chest CT 11/10/2021. FINDINGS: Right-sided internal jugular single-lumen Port-A-Cath with tip terminating at the superior cavoatrial junction. Lung volumes are normal. No consolidative airspace disease. No pleural effusions. No pneumothorax. No pulmonary nodule or mass noted. Pulmonary vasculature and the cardiomediastinal silhouette are within normal limits. Atherosclerotic calcifications in the thoracic aorta. IMPRESSION: 1. No radiographic evidence of acute cardiopulmonary disease. No definite signs of metastatic disease in the thorax. Electronically Signed   By: Vinnie Langton M.D.   On: 01/08/2022 09:38    Procedures Procedures    Medications Ordered in ED Medications  lactated ringers bolus 1,000 mL (0 mLs Intravenous Stopped 01/08/22 1322)  morphine (PF) 4 MG/ML injection 4 mg (4 mg Intravenous Given 01/08/22 1220)  ondansetron (ZOFRAN) injection 4 mg (4 mg Intravenous Given 01/08/22 1220)  piperacillin-tazobactam (ZOSYN) IVPB 3.375 g (3.375 g Intravenous New Bag/Given 01/08/22 1256)    ED Course/ Medical Decision Making/ A&P                            Medical Decision Making Amount and/or Complexity of Data Reviewed Labs: ordered. Radiology: ordered.  Risk Prescription drug management. Decision regarding hospitalization.   Pt with multiple medical problems and comorbidities and presenting today with a complaint that caries a high risk for morbidity and mortality.  Here today with 2 days of abdominal pain, poor oral intake and weakness.  Patient has an extensive history of metastatic spinal cancer currently on to chemotherapy medications which she has been on for quite some time.  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  Concern for biliary colic and obstruction today possible elevated LFTs or pancreatitis.  Patient does have significant right upper quadrant tenderness on exam however also has known lung mets and concern for possible pneumonia as EMS did report that O2 sats were in the high 80s today.  Patient does not typically wear oxygen at home.  Wife has denied any vomiting or fever.  He does seem slightly confused and mentally slowed today which she reports has been gradually declining but more pronounced today.  Low suspicion for stroke at this time.  CT of the head, chest and abdomen are pending for further evaluation of his known prior cancer.  LFTs lipase and CBC also pending.  Patient given IV fluids due to concern for dehydration as he has not been eating for the last few days.  Patient does not display symptoms suggestive of fluid overload today and lower suspicion for PE at this time.  I have independently visualized and interpreted pt's images today.  Chest x-ray today without acute finding, head CT with no evidence of hemorrhage, CT chest without acute findings and CT abdomen pelvis does show significant gallbladder wall  thickening and stone present.  Radiology reports known brain mets without acute worsening, distended gallbladder containing gallstones with new mild pericholecystic stranding which may reflect acute cholecystitis based on patient's exam feel that that is highly likely, increase small volume free fluid in the abdomen pelvis and multifocal osseous metastasis with increased soft tissue component of the lytic left acetabular metastasis and increased sclerotic component of the left fourth rib metastasis.  No  substantial change in size of the treated left upper pole renal lesion.  They recommended ultrasound for further evaluation which was ordered.  Due to nursing shortage still waiting on port to be accessed and blood to be sent.  Patient also had fluids ordered.  Will cover with antibiotics. 1:31 PM I independently interpreted patient's labs today and CBC with a leukocytosis of 16, stable hemoglobin and platelet count, CMP with AKI today with creatinine of 1.5 from his baseline of 0.8, normal LFTs but elevated total bilirubin today 1.6 and anion gap of 16.  Lactic acid elevated at 2.3 and lipase is within normal limits.  Ultrasound shows wall thickening and stones equivocal for cholecystitis.  Given patient's new pain, white count concern for cholecystitis.  He was covered with Zosyn due to his high risk features.  Feel that patient needs admission by the hospitalist service and consult by general surgery.  He was given pain control and fluids.  Dr. Ninfa Linden with gen surgery will see the pt.        Final Clinical Impression(s) / ED Diagnoses Final diagnoses:  Acute cholecystitis  Dehydration  AKI (acute kidney injury) [facility])    Rx / DC Orders ED Discharge Orders     None         Blanchie Dessert, MD 01/08/22 1331    Blanchie Dessert, MD 01/08/22 1358

## 2022-01-08 NOTE — Progress Notes (Signed)
PHARMACY NOTE -  [facility] has been assisting with dosing of Zosyn for IAI. Dosage remains stable at 3.375 g IV q8 hr and further renal adjustments per institutional Pharmacy antibiotic protocol  Pharmacy will sign off, following peripherally for culture results or dose adjustments. Please reconsult if a change in clinical status warrants re-evaluation of dosage.  Reuel Boom, PharmD, BCPS [phone] 01/08/2022, 5:25 PM

## 2022-01-08 NOTE — Progress Notes (Signed)
Referring Physician(s): Meuth,B, PA-C  Supervising Physician: [name]  Patient Status:  [facility] IP  Chief Complaint: Cholecystitis, abdominal/back pain, confusion, nausea   Subjective: Patient familiar to IR service from L4 vertebral body mass biopsy in 2017, and port a cath placement in 2018.  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  He presented to [facility], ED today with complaints of persistent abdominal pain and worsening confusion along with intermittent nausea, back pain, right leg weakness.  CT head revealed known brain mets without evidence of worsening edema, acute hemorrhage or other acute intracranial abnormality.  CT chest abdomen pelvis revealed:  1. Distended gallbladder containing gallstones with new mild pericholecystic stranding, which may reflect acute cholecystitis. Recommend correlation with right upper quadrant ultrasound for further evaluation. 2. Increased small volume free fluid in the abdomen and pelvis. 3. Multifocal osseous metastases with increased soft tissue component of a lytic left acetabular metastasis and increased sclerotic component of a left 4th rib metastasis. 4. Decreased conspicuity of previously noted right upper lobe pulmonary nodules. No definite new or enlarging pulmonary nodules. 5. No substantial change in size of treated left upper pole renal lesion. 6. Aortic atherosclerosis.   US abdomen revealed:  1. Cholelithiasis with gallbladder wall thickening. Findings are equivocal for acute cholecystitis. 2. Fatty infiltration of the liver.   Patient is currently afebrile but tachypneic and tachycardic.  WBC 16.5, hemoglobin normal, platelets normal, PT 15.7, INR 1.3, T BILI 1.6, creat 1.50,LA 2.3, blood cx pend; pt on IV zosyn; also with noted  contrast allergy; request now received from surgical team for percutaneous cholecystostomy.  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   Seizures ([place])    last seizure 01/2019 - controlled since on keppra   Wears glasses    Past Surgical History:  Procedure Laterality Date   insertion port-a-cath  2018   IR GENERIC HISTORICAL  11/18/2015   IR FLUORO GUIDED NEEDLE PLC ASPIRATION/INJECTION LOC 11/18/2015 [name], MD [facility]INTER[facility]   IR GENERIC HISTORICAL  05/16/2016   IR FLUORO GUIDE PORT INSERTION RIGHT 05/16/2016 [name], MD [facility]INTER[facility] RAD   IR GENERIC HISTORICAL  05/16/2016   IR US GUIDE VASC ACCESS RIGHT 05/16/2016 [name], MD [facility]   RADIOLOGY WITH ANESTHESIA N/A 06/16/2019   Procedure: MRI BRAIN WITH AND WITHOUT CONTRAST WITH ANESTHESIA;  Surgeon: Radiologist, Medication, MD;  Location: [place];  Service: Radiology;  Laterality: N/A;        Allergies: Contrast media [iodinated contrast media]  Medications: Prior to Admission medications   Medication Sig Start Date End Date Taking? Authorizing Provider  acetaminophen (TYLENOL) 650 MG CR tablet Take 650 mg by mouth every 8 (eight) hours as needed for pain.   Yes [provider]  Cholecalciferol (VITAMIN D) 50 MCG (2000 UT) CAPS Take 2,000 Units by mouth daily.   Yes [provider]  Cyanocobalamin (VITAMIN B12) 1000 MCG TBCR Take 1,000 mcg by mouth daily.   Yes [provider]  docusate sodium (COLACE) 100 MG capsule Take 200 mg by mouth 2 (two)  times daily.   Yes [provider]  dorzolamide (TRUSOPT) 2 % ophthalmic solution Instill 1 drop into right eye three times daily. 10/27/21  Yes   famotidine (PEPCID) 20 MG tablet Take 20 mg by mouth 2 (two) times daily.   Yes  [provider]  hydrocortisone (CORTEF) 10 MG tablet Take 2 tablets (20 mg total) by mouth in the morning AND 1 tablet (10 mg total) every evening. 12/29/21  Yes [name], MD  ibuprofen (ADVIL,MOTRIN) 200 MG tablet Take 200 mg by mouth every 8 (eight) hours as needed for fever, headache, mild pain, moderate pain or cramping.    Yes [provider]  lenvatinib 18 mg daily dose (LENVIMA, 18 MG DAILY DOSE,) 10 MG & 2 x 4 MG capsule Take 18 mg by mouth daily. 11/30/21  Yes [name], MD  levETIRAcetam (KEPPRA) 250 MG tablet Take 250 mg by mouth 2 (two) times daily.   Yes [provider]  lidocaine-prilocaine (EMLA) cream APPLY TO PORTACATH 1 HOUR PRIOR TO USE AS NEEDED 11/13/21  Yes [name], MD  losartan-hydrochlorothiazide (HYZAAR) 100-12.5 MG tablet Take 1 tablet by mouth once a day. 12/29/21  Yes   mirtazapine (REMERON) 30 MG tablet Take 1 tablet (30 mg total) by mouth at bedtime. 12/29/21  Yes [name], MD  Multiple Vitamin (MULTIVITAMIN ADULT PO) Take 1 tablet by mouth daily. [place] product   Yes [provider]  ondansetron (ZOFRAN) 8 MG tablet TAKE 1 TABLET BY MOUTH EVERY 8 HOURS AS NEEDED FOR NAUSEA AND VOMITING Patient taking differently: Take 8 mg by mouth every 8 (eight) hours as needed for nausea or vomiting. 11/03/21  Yes Owens Shark, NP  oxyCODONE (OXY IR/ROXICODONE) 5 MG immediate release tablet Take 1 tablet by mouth every 6 hours as needed for severe pain. 12/28/21  Yes Owens Shark, NP  oxyCODONE (OXYCONTIN) 10 mg 12 hr tablet TAKE 2 TABLETS BY MOUTH EVERY 12 HOURS. Patient taking differently: Take 20 mg by mouth every 12 (twelve) hours. 12/28/21  Yes Owens Shark, NP  polyethylene glycol (MIRALAX / GLYCOLAX) 17 g packet Take 17 g by mouth daily.   Yes [provider]  prochlorperazine (COMPAZINE) 10 MG tablet Take 1 tablet by mouth every 6 hours as needed for nausea or vomiting. 01/04/21  Yes Owens Shark, NP   Testosterone 1.62 % GEL Apply 2 pumps topically daily Patient taking differently: Apply 2 Pump topically 2 (two) times a week. 07/04/21  Yes Owens Shark, NP  COVID-19 mRNA vaccine [phone] (COMIRNATY) syringe Inject into the muscle. 12/27/21   Carlyle Basques, MD  predniSONE (DELTASONE) 50 MG tablet Take 1  tablet by mouth 13 hours, 7 hours, and 1 hour prior to CT scan. Patient not taking: Reported on 11/13/2021 11/06/21   [name], MD     Vital Signs: BP 132/69   Pulse (!) 58   Temp 98.9 F (37.2 C) (Oral)   Resp (!) 31   Ht _0  (1.803 m)   Wt 250 lb (113.4 kg)   SpO2 91%   BMI 34.87 kg/m   Physical Exam patient awake but confused, not F/C ; spouse at bedside; chest clear to auscultation bilaterally anteriorly.  Right chest wall Port-A-Cath intact.  Heart with tachycardic ,irregular rhythm; abdomen obese, soft, few bowel sounds, palpable tenderness right upper quadrant; no LE edema  Imaging: US Abdomen Limited RUQ (LIVER/GB)  Result Date: 01/08/2022 CLINICAL DATA:  Right  upper quadrant abdominal pain. EXAM: ULTRASOUND ABDOMEN LIMITED RIGHT UPPER QUADRANT COMPARISON:  CT of the abdomen pelvis 01/08/2022 FINDINGS: Gallbladder: Gallbladder wall thickening measures up to 6 mm. Multiple shadowing stones are present in the neck of the gallbladder. No sonographic Percell Miller sign was reported. Common bile duct: Diameter: 4.0 mm, within normal limits Liver: Liver is diffusely echogenic. No discrete lesions are present. Portal vein is patent on color Doppler imaging with normal direction of blood flow towards the liver. Other: None. IMPRESSION: 1. Cholelithiasis with gallbladder wall thickening. Findings are equivocal for acute cholecystitis. 2. Fatty infiltration of the liver. Electronically Signed   By: San Morelle M.D.   On: 01/08/2022 12:04   CT Chest Wo Contrast  Result Date: 01/08/2022 CLINICAL DATA:  History of renal cell carcinoma currently on treatment increased  weakness, abdominal pain, nausea and vomiting EXAM: CT CHEST, ABDOMEN AND PELVIS WITHOUT CONTRAST TECHNIQUE: Multidetector CT imaging of the chest, abdomen and pelvis was performed following the standard protocol without IV contrast. RADIATION DOSE REDUCTION: This exam was performed according to the departmental dose-optimization program which includes automated exposure control, adjustment of the mA and/or kV according to patient size and/or use of iterative reconstruction technique. COMPARISON:  CT chest, abdomen, and pelvis dated 11/10/2021 FINDINGS: CT CHEST FINDINGS Cardiovascular: Right chest wall port tip terminates in the lower SVC. Normal heart size. No significant pericardial fluid/thickening. Coronary artery calcifications. Great vessels are normal in course and caliber. Aortic atherosclerosis. Mediastinum/Nodes: thyroid gland without nodules meeting criteria for imaging follow-up by size. Normal esophagus. No pathologically enlarged axillary, supraclavicular, mediastinal, or hilar lymph nodes. Lungs/Pleura: There is mild respiratory motion artifact. The central airways are patent. No focal consolidation. Decreased conspicuity of previously noted right upper lobe nodules measuring average 4 mm (6:41) and 3 mm (6:65). No definite new or enlarging pulmonary nodules. No pneumothorax. No pleural effusion. Musculoskeletal: Similar multifocal osseous metastases, most notably mixed lytic and sclerotic appearance of T1 and T9 and similar expansile lytic lesion in right T11 costovertebral junction. Slightly increased sclerotic component of the left 4th rib (5:187). CT ABDOMEN PELVIS FINDINGS Hepatobiliary: Similar segment 4 cyst (2:53). Previously noted segment 3 cystic focus is not well seen. No new focal hepatic lesions. No intra or extrahepatic biliary ductal dilation. Distended gallbladder containing gallstones. Similar gallbladder fundal adenomyomatosis. New mild pericholecystic stranding. Pancreas: No  pancreatic ductal dilatation or surrounding inflammatory changes. Spleen: Normal in size without focal abnormality. Adrenals/Urinary Tract: No adrenal nodules. No substantial change in size of treated left upper pole lesion measuring 2.2 x 2.0 cm (2:67). Unchanged subcentimeter exophytic focus along the anterior lower pole left kidney (2:81). No new or enlarging renal masses. No calculi or hydronephrosis. No focal bladder wall thickening. Stomach/Bowel: Normal appearance of the stomach. No evidence of bowel wall thickening, distention, or inflammatory changes. Normal appendix. Vascular/Lymphatic: No significant vascular findings are present. No enlarged abdominal or pelvic lymph nodes. Reproductive: Enlarged prostate gland. Other: Increased small volume free fluid.  No fluid collection. Musculoskeletal: Multifocal osseous metastases with increased soft tissue component of a lytic left acetabular metastasis, for example measuring 3.9 x 2.4 cm inferiorly (2:126), previously 3.2 x 2.2 cm. Unchanged compression of L1 and L4. IMPRESSION: 1. Distended gallbladder containing gallstones with new mild pericholecystic stranding, which may reflect acute cholecystitis. Recommend correlation with right upper quadrant ultrasound for further evaluation. 2. Increased small volume free fluid in the abdomen and pelvis. 3. Multifocal osseous metastases with increased soft tissue component of a lytic left acetabular metastasis and  increased sclerotic component of a left 4th rib metastasis. 4. Decreased conspicuity of previously noted right upper lobe pulmonary nodules. No definite new or enlarging pulmonary nodules. 5. No substantial change in size of treated left upper pole renal lesion. 6. Aortic atherosclerosis. Aortic Atherosclerosis (ICD10-I70.0). Electronically Signed   By: Darrin Nipper M.D.   On: 01/08/2022 11:04   CT ABDOMEN PELVIS WO CONTRAST  Result Date: 01/08/2022 CLINICAL DATA:  History of renal cell carcinoma currently  on treatment increased weakness, abdominal pain, nausea and vomiting EXAM: CT CHEST, ABDOMEN AND PELVIS WITHOUT CONTRAST TECHNIQUE: Multidetector CT imaging of the chest, abdomen and pelvis was performed following the standard protocol without IV contrast. RADIATION DOSE REDUCTION: This exam was performed according to the departmental dose-optimization program which includes automated exposure control, adjustment of the mA and/or kV according to patient size and/or use of iterative reconstruction technique. COMPARISON:  CT chest, abdomen, and pelvis dated 11/10/2021 FINDINGS: CT CHEST FINDINGS Cardiovascular: Right chest wall port tip terminates in the lower SVC. Normal heart size. No significant pericardial fluid/thickening. Coronary artery calcifications. Great vessels are normal in course and caliber. Aortic atherosclerosis. Mediastinum/Nodes: thyroid gland without nodules meeting criteria for imaging follow-up by size. Normal esophagus. No pathologically enlarged axillary, supraclavicular, mediastinal, or hilar lymph nodes. Lungs/Pleura: There is mild respiratory motion artifact. The central airways are patent. No focal consolidation. Decreased conspicuity of previously noted right upper lobe nodules measuring average 4 mm (6:41) and 3 mm (6:65). No definite new or enlarging pulmonary nodules. No pneumothorax. No pleural effusion. Musculoskeletal: Similar multifocal osseous metastases, most notably mixed lytic and sclerotic appearance of T1 and T9 and similar expansile lytic lesion in right T11 costovertebral junction. Slightly increased sclerotic component of the left 4th rib (5:187). CT ABDOMEN PELVIS FINDINGS Hepatobiliary: Similar segment 4 cyst (2:53). Previously noted segment 3 cystic focus is not well seen. No new focal hepatic lesions. No intra or extrahepatic biliary ductal dilation. Distended gallbladder containing gallstones. Similar gallbladder fundal adenomyomatosis. New mild pericholecystic  stranding. Pancreas: No pancreatic ductal dilatation or surrounding inflammatory changes. Spleen: Normal in size without focal abnormality. Adrenals/Urinary Tract: No adrenal nodules. No substantial change in size of treated left upper pole lesion measuring 2.2 x 2.0 cm (2:67). Unchanged subcentimeter exophytic focus along the anterior lower pole left kidney (2:81). No new or enlarging renal masses. No calculi or hydronephrosis. No focal bladder wall thickening. Stomach/Bowel: Normal appearance of the stomach. No evidence of bowel wall thickening, distention, or inflammatory changes. Normal appendix. Vascular/Lymphatic: No significant vascular findings are present. No enlarged abdominal or pelvic lymph nodes. Reproductive: Enlarged prostate gland. Other: Increased small volume free fluid.  No fluid collection. Musculoskeletal: Multifocal osseous metastases with increased soft tissue component of a lytic left acetabular metastasis, for example measuring 3.9 x 2.4 cm inferiorly (2:126), previously 3.2 x 2.2 cm. Unchanged compression of L1 and L4. IMPRESSION: 1. Distended gallbladder containing gallstones with new mild pericholecystic stranding, which may reflect acute cholecystitis. Recommend correlation with right upper quadrant ultrasound for further evaluation. 2. Increased small volume free fluid in the abdomen and pelvis. 3. Multifocal osseous metastases with increased soft tissue component of a lytic left acetabular metastasis and increased sclerotic component of a left 4th rib metastasis. 4. Decreased conspicuity of previously noted right upper lobe pulmonary nodules. No definite new or enlarging pulmonary nodules. 5. No substantial change in size of treated left upper pole renal lesion. 6. Aortic atherosclerosis. Aortic Atherosclerosis (ICD10-I70.0). Electronically Signed   By: Shawn Route.D.  On: 01/08/2022 11:04   CT Head Wo Contrast  Result Date: 01/08/2022 CLINICAL DATA:  Worsening weakness.  Known  brain metastases. EXAM: CT HEAD WITHOUT CONTRAST TECHNIQUE: Contiguous axial images were obtained from the base of the skull through the vertex without intravenous contrast. RADIATION DOSE REDUCTION: This exam was performed according to the departmental dose-optimization program which includes automated exposure control, adjustment of the mA and/or kV according to patient size and/or use of iterative reconstruction technique. COMPARISON:  Head MRI 01/02/2022 FINDINGS: Brain: There is no evidence of an acute infarct, acute intracranial hemorrhage, midline shift, or extra-axial fluid collection. Two known small metastases in the medial left frontal lobe are partially calcified. Mild-to-moderate left frontal lobe edema is similar to the prior MRI. Smaller areas of edema or gliosis in the left temporal and left occipital lobes are unchanged. 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 There is a background of mild chronic small vessel ischemia elsewhere in the cerebral white matter. Vascular: Calcified atherosclerosis at the skull base. No hyperdense vessel. Skull: No fracture or suspicious osseous lesion. Sinuses/Orbits: Mild bilateral ethmoid air cell mucosal thickening. Clear mastoid air cells. Unremarkable orbits. Other: None. IMPRESSION: Known brain metastases without evidence of worsening edema, acute hemorrhage, or other acute intracranial abnormality. Electronically Signed   By: Logan Bores M.D.   On: 01/08/2022 10:36   DG Chest Port 1 View  Result Date: 01/08/2022 CLINICAL DATA:  76 year old male with history of hypoxia and weakness. History of renal cell carcinoma. EXAM: PORTABLE CHEST 1 VIEW COMPARISON:  No prior chest x-ray.  Chest CT 11/10/2021. FINDINGS: Right-sided internal jugular single-lumen Port-A-Cath with tip terminating at the superior cavoatrial junction. Lung volumes are normal. No consolidative airspace disease. No pleural effusions. No pneumothorax. No pulmonary  nodule or mass noted. Pulmonary vasculature and the cardiomediastinal silhouette are within normal limits. Atherosclerotic calcifications in the thoracic aorta. IMPRESSION: 1. No radiographic evidence of acute cardiopulmonary disease. No definite signs of metastatic disease in the thorax. Electronically Signed   By: Vinnie Langton M.D.   On: 01/08/2022 09:38    Labs:  CBC: Recent Labs    11/13/21 1040 12/05/21 0901 12/27/21 1017 01/08/22 1210  WBC 9.4 8.0 7.3 16.5*  HGB 11.4* 10.4* 10.3* 13.2  HCT 36.5* 34.4* 33.4* 42.6  PLT 275 321 327 357    COAGS: No results for input(s): "INR", "APTT" in the last 8760 hours.  BMP: Recent Labs    11/13/21 1040 12/05/21 0901 12/27/21 1017 01/08/22 1210  NA 134* 138 140 137  K 3.3* 4.0 3.8 4.5  CL 95* 100 101 96*  CO2 _0 GLUCOSE 157* 178* 188* 156*  BUN _1 CALCIUM 9.0 9.3 9.2 8.4*  CREATININE 0.86 0.84 0.82 1.50*  GFRNONAA >60 >60 >60 48*    LIVER FUNCTION TESTS: Recent Labs    11/13/21 1040 12/05/21 0901 12/27/21 1017 01/08/22 1210  BILITOT 0.3 0.2* 0.2* 1.6*  AST 11* 9* 10* 29  ALT _2 ALKPHOS 53 61 83 79  PROT 6.4* 6.8 6.6 6.9  ALBUMIN 3.9 3.8 3.7 2.9*    Assessment and Plan: Patient familiar to IR service from L4 vertebral body mass biopsy in 2017, and port a cath placement in 2018.  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  He presented to [facility] ED today with complaints of persistent abdominal pain and worsening confusion along with intermittent nausea, back pain, right leg weakness.  CT head revealed known brain mets without evidence of worsening edema, acute hemorrhage or other acute intracranial abnormality.  CT chest abdomen pelvis revealed:  1. Distended gallbladder containing gallstones with new  mild pericholecystic stranding, which may reflect acute cholecystitis. Recommend correlation with right upper quadrant ultrasound for further evaluation. 2. Increased small volume free fluid in the abdomen and pelvis. 3. Multifocal osseous metastases with increased soft tissue component of a lytic left acetabular metastasis and increased sclerotic component of a left 4th rib metastasis. 4. Decreased conspicuity of previously noted right upper lobe pulmonary nodules. No definite new or enlarging pulmonary nodules. 5. No substantial change in size of treated left upper pole renal lesion. 6. Aortic atherosclerosis.   US abdomen revealed:  1. Cholelithiasis with gallbladder wall thickening. Findings are equivocal for acute cholecystitis. 2. Fatty infiltration of the liver.   Patient is currently afebrile but tachypneic and tachycardic.  WBC 16.5, hemoglobin normal, platelets normal, PT 15.7, INR 1.3, T BILI 1.6, creat 1.50,LA 2.3, blood cx pend; pt on IV zosyn; also with noted contrast allergy; request now received from surgical team for percutaneous cholecystostomy.  Imaging studies have been reviewed by Dr. Laurence Ferrari.  Details/risks of procedure, including but not limited to, internal bleeding, infection, injury to adjacent structures discussed with patient's spouse with her understanding and consent.  Patient will be premedicated for contrast allergy using 13-hour prep; if clinical deterioration occurs we will plan to do emergently.   Electronically Signed: D. Rowe Robert, PA-C 01/08/2022, 3:30 PM   I spent a total of 25 Minutes at the the patient's bedside AND on the patient's hospital floor or unit, greater than 50% of which was counseling/coordinating care for percutaneous cholecystostomy    Patient ID: Letroy Vazguez, male   DOB: August 10, 1945, 76 y.o.   MRN: [id number]

## 2022-01-08 NOTE — H&P (Signed)
History and Physical    Patient: Phillip [name] RFF:[id number] DOB: November 06, 1945 DOA: 01/08/2022 DOS: the patient was seen and examined on 01/08/2022 PCP: [name], MD  Patient coming from: Home  Chief Complaint:  Chief Complaint  Patient presents with   Weakness   HPI: Phillip [name] is a 76 y.o. 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 Presenting with confusion and abdominal pain. History is from wife at bedside. She reports that the patient had epigastric abdominal pain after eating some BBQ 2 days ago. He was nauseous, but didn't vomit. There were no fevers. He's had similar episodes in the past and they thought it was gas. So she gave him some Gas-X. However, his symptoms did not improve. He refused to eat or drink yesterday. He seemed to become more confused as the day progressed. This morning when his symptoms had not improved, she decided to bring him to the ED for evaluation. She denies any other aggravating or alleviating factors.    Review of Systems: Unable to review all systems due to lack of cooperation from patient. 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   Seizures ([place])    last seizure 01/2019 - controlled since on keppra   Wears glasses    Past Surgical History:  Procedure Laterality Date   insertion port-a-cath  2018   IR GENERIC HISTORICAL  11/18/2015   IR FLUORO GUIDED NEEDLE PLC ASPIRATION/INJECTION LOC 11/18/2015 [name], MD [facility]   IR GENERIC HISTORICAL  05/16/2016   IR FLUORO GUIDE PORT INSERTION RIGHT 05/16/2016 [name], MD [facility]   IR GENERIC HISTORICAL  05/16/2016   IR US GUIDE VASC ACCESS RIGHT 05/16/2016 [name], MD [facility]   RADIOLOGY WITH ANESTHESIA N/A 06/16/2019    Procedure: MRI BRAIN WITH AND WITHOUT CONTRAST WITH ANESTHESIA;  Surgeon: Radiologist, Medication, MD;  Location: [place];  Service: Radiology;  Laterality: N/A;   Social History:  reports that he has never smoked. He has never used smokeless tobacco. He reports that he does not currently use alcohol after a past usage of about 1.0 standard drink of alcohol per week. He reports current drug use. Drug: Marijuana.  Allergies  Allergen Reactions   Contrast Media [Iodinated Contrast Media] Rash    Family History  Problem Relation Age of Onset   Cancer Grandchild        lymphoma    Prior to Admission medications   Medication Sig Start Date End Date Taking? Authorizing Provider  acetaminophen (TYLENOL) 650 MG CR tablet Take 650 mg by mouth every 8 (eight) hours as needed for pain.   Yes [provider]  Cholecalciferol (VITAMIN D) 50 MCG (2000 UT) CAPS Take 2,000 Units by mouth daily.   Yes [provider]  Cyanocobalamin (VITAMIN B12) 1000 MCG TBCR Take 1,000 mcg by mouth daily.   Yes [provider]  docusate sodium (COLACE) 100 MG capsule Take 200 mg by mouth 2 (two) times daily.   Yes [provider]  dorzolamide (TRUSOPT) 2 % ophthalmic solution Instill 1 drop into right eye three times daily. 10/27/21  Yes   famotidine (PEPCID) 20 MG tablet Take 20 mg by  mouth 2 (two) times daily.   Yes [provider]  hydrocortisone (CORTEF) 10 MG tablet Take 2 tablets (20 mg total) by mouth in the morning AND 1 tablet (10 mg total) every evening. 12/29/21  Yes [name], MD  ibuprofen (ADVIL,MOTRIN) 200 MG tablet Take 200 mg by mouth every 8 (eight) hours as needed for fever, headache, mild pain, moderate pain or cramping.    Yes [provider]  lenvatinib 18 mg daily dose (LENVIMA, 18 MG DAILY DOSE,) 10 MG & 2 x 4 MG capsule Take 18 mg by mouth daily. 11/30/21  Yes [name], MD  levETIRAcetam (KEPPRA) 250 MG tablet Take 250 mg by mouth 2  (two) times daily.   Yes [provider]  lidocaine-prilocaine (EMLA) cream APPLY TO PORTACATH 1 HOUR PRIOR TO USE AS NEEDED 11/13/21  Yes [name], MD  losartan-hydrochlorothiazide (HYZAAR) 100-12.5 MG tablet Take 1 tablet by mouth once a day. 12/29/21  Yes   mirtazapine (REMERON) 30 MG tablet Take 1 tablet (30 mg total) by mouth at bedtime. 12/29/21  Yes [name], MD  Multiple Vitamin (MULTIVITAMIN ADULT PO) Take 1 tablet by mouth daily. [place] product   Yes [provider]  ondansetron (ZOFRAN) 8 MG tablet TAKE 1 TABLET BY MOUTH EVERY 8 HOURS AS NEEDED FOR NAUSEA AND VOMITING Patient taking differently: Take 8 mg by mouth every 8 (eight) hours as needed for nausea or vomiting. 11/03/21  Yes Owens Shark, NP  oxyCODONE (OXY IR/ROXICODONE) 5 MG immediate release tablet Take 1 tablet by mouth every 6 hours as needed for severe pain. 12/28/21  Yes Owens Shark, NP  oxyCODONE (OXYCONTIN) 10 mg 12 hr tablet TAKE 2 TABLETS BY MOUTH EVERY 12 HOURS. Patient taking differently: Take 20 mg by mouth every 12 (twelve) hours. 12/28/21  Yes Owens Shark, NP  polyethylene glycol (MIRALAX / GLYCOLAX) 17 g packet Take 17 g by mouth daily.   Yes [provider]  prochlorperazine (COMPAZINE) 10 MG tablet Take 1 tablet by mouth every 6 hours as needed for nausea or vomiting. 01/04/21  Yes Owens Shark, NP  Testosterone 1.62 % GEL Apply 2 pumps topically daily Patient taking differently: Apply 2 Pump topically 2 (two) times a week. 07/04/21  Yes Owens Shark, NP  COVID-19 mRNA vaccine [phone] (COMIRNATY) syringe Inject into the muscle. 12/27/21   Carlyle Basques, MD  predniSONE (DELTASONE) 50 MG tablet Take 1  tablet by mouth 13 hours, 7 hours, and 1 hour prior to CT scan. Patient not taking: Reported on 11/13/2021 11/06/21   [name], MD    Physical Exam: Vitals:   01/08/22 1230 01/08/22 1300 01/08/22 1330 01/08/22 1400  BP:  137/62 116/68 103/81  Pulse:  (!)  58 (!) 58 60  Resp: (!) 28 (!) 30 (!) 32 (!) 31  Temp: 98.9 F (37.2 C)     TempSrc: Oral     SpO2:  91% 91% 91%  Weight:      Height:       General: 76 y.o. male resting in bed in NAD Eyes: PERRL, normal sclera ENMT: Nares patent w/o discharge, orophaynx clear, dentition normal, ears w/o discharge/lesions/ulcers Neck: Supple, trachea midline Cardiovascular: tachy, +S1, S2, no g/r, 2/6 SEM, equal pulses throughout Respiratory: CTABL, no w/r/r, normal WOB GI: BS+, ND epigastric/RUQ TTP, no masses noted, no organomegaly noted MSK: No e/c/c Neuro: alert and tracking across room; he follows some commands, generally weak  Data Reviewed:  Results for orders placed or performed during the hospital encounter of 01/08/22 (from the past 24 hour(s))  CBG monitoring, ED     Status: Abnormal   Collection Time: 01/08/22  9:22 AM  Result Value Ref Range   Glucose-Capillary 167 (H) 70 - 99 mg/dL  Lactic acid, plasma     Status: Abnormal   Collection Time: 01/08/22  9:31 AM  Result Value Ref Range   Lactic Acid, Venous 2.3 (HH) 0.5 - 1.9 mmol/L  CBC with Differential/Platelet     Status: Abnormal   Collection Time: 01/08/22 12:10 PM  Result Value Ref Range   WBC 16.5 (H) 4.0 - 10.5 K/uL   RBC 5.04 4.22 - 5.81 MIL/uL   Hemoglobin 13.2 13.0 - 17.0 g/dL   HCT 42.6 39.0 - 52.0 %   MCV 84.5 80.0 - 100.0 fL   MCH 26.2 26.0 - 34.0 pg   MCHC 31.0 30.0 - 36.0 g/dL   RDW 17.1 (H) 11.5 - 15.5 %   Platelets 357 150 - 400 K/uL   nRBC 0.0 0.0 - 0.2 %   Neutrophils Relative % 63 %   Neutro Abs 10.2 (H) 1.7 - 7.7 K/uL   Lymphocytes Relative 22 %   Lymphs Abs 3.7 0.7 - 4.0 K/uL   Monocytes Relative 12 %   Monocytes Absolute 2.0 (H) 0.1 - 1.0 K/uL   Eosinophils Relative 3 %   Eosinophils Absolute 0.5 0.0 - 0.5 K/uL   Basophils Relative 0 %   Basophils Absolute 0.1 0.0 - 0.1 K/uL   Immature Granulocytes 0 %   Abs Immature Granulocytes 0.07 0.00 - 0.07 K/uL  Comprehensive metabolic panel      Status: Abnormal   Collection Time: 01/08/22 12:10 PM  Result Value Ref Range   Sodium 137 135 - 145 mmol/L   Potassium 4.5 3.5 - 5.1 mmol/L   Chloride 96 (L) 98 - 111 mmol/L   CO2 25 22 - 32 mmol/L   Glucose, Bld 156 (H) 70 - 99 mg/dL   BUN 18 8 - 23 mg/dL   Creatinine, Ser 1.50 (H) 0.61 - 1.24 mg/dL   Calcium 8.4 (L) 8.9 - 10.3 mg/dL   Total Protein 6.9 6.5 - 8.1 g/dL   Albumin 2.9 (L) 3.5 - 5.0 g/dL   AST 29 15 - 41 U/L   ALT 13 0 - 44 U/L   Alkaline Phosphatase 79 38 - 126 U/L   Total Bilirubin 1.6 (H) 0.3 - 1.2 mg/dL   GFR, Estimated 48 (L) >60 mL/min   Anion gap 16 (H) 5 - 15  Lipase, blood     Status: None   Collection Time: 01/08/22 12:10 PM  Result Value Ref Range   Lipase 22 11 - 51 U/L   *Note: Due to a large number of results and/or encounters for the requested time period, some results have not been displayed. A complete set of results can be found in Results Review.   CXR 1. No radiographic evidence of acute cardiopulmonary disease. No definite signs of metastatic disease in the thorax.  CT Ab/pelvis CT Chest 1. Distended gallbladder containing gallstones with new mild pericholecystic stranding, which may reflect acute cholecystitis. Recommend correlation with right upper quadrant ultrasound for further evaluation. 2. Increased small volume free fluid in the abdomen and pelvis. 3. Multifocal osseous metastases with increased soft tissue component of a lytic left acetabular metastasis and increased sclerotic component of a left 4th rib metastasis. 4. Decreased conspicuity of  previously noted right upper lobe pulmonary nodules. No definite new or enlarging pulmonary nodules. 5. No substantial change in size of treated left upper pole renal lesion. 6. Aortic atherosclerosis.  CT Head: Known brain metastases without evidence of worsening edema, acute hemorrhage, or other acute intracranial abnormality.  [name] RUQ Ab 1. Cholelithiasis with gallbladder wall  thickening. Findings are equivocal for acute cholecystitis. 2. Fatty infiltration of the liver.  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 Inpatient status is judged to be reasonable and necessary in order to provide the required intensity of service to ensure the patient's safety. The patient's presenting symptoms, physical exam findings, and initial radiographic and laboratory data in the context of their chronic comorbidities is felt to place them at high risk for further clinical deterioration. Furthermore, it is not anticipated that the patient will be medically stable for discharge from the hospital within 2 midnights of admission.   * I certify that at the point of admission it is my clinical judgment that the patient will require inpatient hospital care spanning beyond 2 midnights from the point of admission due to high intensity of service, high risk for further deterioration and high frequency of surveillance required.*  Author: Jonnie Finner,  DO 01/08/2022 2:16 PM  For on call review [email or web address].

## 2022-01-08 NOTE — ED Notes (Signed)
Pt has port for blood draw advised triage nurse

## 2022-01-08 NOTE — Consult Note (Signed)
[facility] Surgery Consult Note  Phillip [name] 09-25-1945  [id number].    Requesting MD: Blanchie Dessert Chief Complaint/Reason for Consult: cholecystitis   HPI:  Phillip [name] is a 76 y.o. 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 Wife states that two days ago he complained of central abdominal pain after eating BBQ. Some nausea, no emesis. Pain seemed to improve but yesterday he developed worsening confusion and weakness. Denies fever or chills. Wife states that he has had several similar episodes in the past, but never this severe. Due to his worsening symptoms she decided to bring him to the ED. Work up in the ED included CT scan which showed a distended gallbladder containing gallstones with new mild pericholecystic stranding. U/s shows gallbladder wall thickening measuring up to 6 mm with multiple shadowing stones present in the neck of the gallbladder. CT head with known brain metastases but no acute findings. WBC 16.5, lactic acid 2.3, Cr 1.5; LFTs and lipase ok except Tbili 1.6. General surgery asked to see.  Abdominal surgical history: none Anticoagulants: none Lives at home with wife Ambulates with walker    Family History  Problem Relation Age of Onset   Cancer Grandchild        lymphoma    Past Medical History:  Diagnosis Date   Anxiety    Brain cancer ([place])    Chronic kidney disease    renal cancer   Constipation    Depression    Hypertension    Low testosterone in male    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   Seizures ([place])    last seizure 01/2019 - controlled since on keppra   Wears glasses     Past Surgical History:  Procedure Laterality Date   insertion port-a-cath  2018   IR GENERIC HISTORICAL  11/18/2015   IR FLUORO GUIDED NEEDLE PLC  ASPIRATION/INJECTION LOC 11/18/2015 [name], MD [facility]-INTERV RAD   IR GENERIC HISTORICAL  05/16/2016   IR FLUORO GUIDE PORT INSERTION RIGHT 05/16/2016 [name], MD [facility]-INTERV RAD   IR GENERIC HISTORICAL  05/16/2016   IR US GUIDE VASC ACCESS RIGHT 05/16/2016 [name], MD [facility]-INTERV RAD   RADIOLOGY WITH ANESTHESIA N/A 06/16/2019   Procedure: MRI BRAIN WITH AND WITHOUT CONTRAST WITH ANESTHESIA;  Surgeon: Radiologist, Medication, MD;  Location: [place];  Service: Radiology;  Laterality: N/A;    Social History:  reports that he has never smoked. He has never used smokeless tobacco. He reports that he does not currently use alcohol after a past usage of about 1.0 standard drink of alcohol per week. He reports current drug use. Drug: Marijuana.  Allergies:  Allergies  Allergen Reactions   Contrast Media [Iodinated Contrast Media] Rash    (Not in a hospital admission)   Prior to Admission medications   Medication Sig Start Date End Date Taking? Authorizing Provider  acetaminophen (TYLENOL) 650 MG CR tablet Take 650 mg by mouth every 8 (eight) hours as needed for pain.   Yes [provider]  Cholecalciferol (VITAMIN D) 50 MCG (2000 UT) CAPS Take 2,000 Units by mouth daily.   Yes [provider]  Cyanocobalamin (VITAMIN B12) 1000 MCG TBCR Take 1,000 mcg by mouth daily.   Yes [provider]  docusate sodium (COLACE) 100 MG capsule Take 200 mg by mouth 2 (  two) times daily.   Yes [provider]  dorzolamide (TRUSOPT) 2 % ophthalmic solution Instill 1 drop into right eye three times daily. 10/27/21  Yes   famotidine (PEPCID) 20 MG tablet Take 20 mg by mouth 2 (two) times daily.   Yes [provider]  hydrocortisone (CORTEF) 10 MG tablet Take 2 tablets (20 mg total) by mouth in the morning AND 1 tablet (10 mg total) every evening. 12/29/21  Yes [name], MD  ibuprofen (ADVIL,MOTRIN) 200 MG tablet Take 200 mg by mouth every 8 (eight) hours  as needed for fever, headache, mild pain, moderate pain or cramping.    Yes [provider]  lenvatinib 18 mg daily dose (LENVIMA, 18 MG DAILY DOSE,) 10 MG & 2 x 4 MG capsule Take 18 mg by mouth daily. 11/30/21  Yes [name], MD  levETIRAcetam (KEPPRA) 250 MG tablet Take 250 mg by mouth 2 (two) times daily.   Yes [provider]  lidocaine-prilocaine (EMLA) cream APPLY TO PORTACATH 1 HOUR PRIOR TO USE AS NEEDED 11/13/21  Yes [name], MD  losartan-hydrochlorothiazide (HYZAAR) 100-12.5 MG tablet Take 1 tablet by mouth once a day. 12/29/21  Yes   mirtazapine (REMERON) 30 MG tablet Take 1 tablet (30 mg total) by mouth at bedtime. 12/29/21  Yes [name], MD  Multiple Vitamin (MULTIVITAMIN ADULT PO) Take 1 tablet by mouth daily. [place] product   Yes [provider]  ondansetron (ZOFRAN) 8 MG tablet TAKE 1 TABLET BY MOUTH EVERY 8 HOURS AS NEEDED FOR NAUSEA AND VOMITING Patient taking differently: Take 8 mg by mouth every 8 (eight) hours as needed for nausea or vomiting. 11/03/21  Yes Owens Shark, NP  oxyCODONE (OXY IR/ROXICODONE) 5 MG immediate release tablet Take 1 tablet by mouth every 6 hours as needed for severe pain. 12/28/21  Yes Owens Shark, NP  oxyCODONE (OXYCONTIN) 10 mg 12 hr tablet TAKE 2 TABLETS BY MOUTH EVERY 12 HOURS. Patient taking differently: Take 20 mg by mouth every 12 (twelve) hours. 12/28/21  Yes Owens Shark, NP  polyethylene glycol (MIRALAX / GLYCOLAX) 17 g packet Take 17 g by mouth daily.   Yes [provider]  prochlorperazine (COMPAZINE) 10 MG tablet Take 1 tablet by mouth every 6 hours as needed for nausea or vomiting. 01/04/21  Yes Owens Shark, NP  Testosterone 1.62 % GEL Apply 2 pumps topically daily Patient taking differently: Apply 2 Pump topically 2 (two) times a week. 07/04/21  Yes Owens Shark, NP  COVID-19 mRNA vaccine [phone] (COMIRNATY) syringe Inject into the muscle. 12/27/21   Carlyle Basques, MD   predniSONE (DELTASONE) 50 MG tablet Take 1  tablet by mouth 13 hours, 7 hours, and 1 hour prior to CT scan. Patient not taking: Reported on 11/13/2021 11/06/21   [name], MD    Blood pressure 103/81, pulse 60, temperature 98.9 F (37.2 C), temperature source Oral, resp. rate (!) 31, height _0  (1.803 m), weight 113.4 kg, SpO2 91 %. Physical Exam: General: chronically ill appearing male who is laying in bed in NAD HEENT: head is normocephalic, atraumatic.  Sclera are noninjected.  Pupils equal and round.  Ears and nose without any masses or lesions.  Mouth is dry Heart: tachycardic, irregular rhythm.  Palpable pedal pulses bilaterally  Lungs: CTAB, no wheezes, rhonchi, or rales noted.  Respiratory effort nonlabored on room air Abd: soft, protuberant, no masses, hernias, or organomegaly. Mild diffuse tenderness with  moderate RUQ TTP with voluntary guarding MS: no BUE/BLE edema, calves soft and nontender Skin: warm and dry with no masses, lesions, or rashes Psych: Alert, does not answer orientation questions for me Neuro: MAEs but will not f/c  Results for orders placed or performed during the hospital encounter of 01/08/22 (from the past 48 hour(s))  CBG monitoring, ED     Status: Abnormal   Collection Time: 01/08/22  9:22 AM  Result Value Ref Range   Glucose-Capillary 167 (H) 70 - 99 mg/dL    Comment: Glucose reference range applies only to samples taken after fasting for at least 8 hours.  Lactic acid, plasma     Status: Abnormal   Collection Time: 01/08/22  9:31 AM  Result Value Ref Range   Lactic Acid, Venous 2.3 (HH) 0.5 - 1.9 mmol/L    Comment: CRITICAL RESULT CALLED TO, READ BACK BY AND VERIFIED WITH SAVOIE,B. RN AT 1314 01/08/22 MULLINS,T Performed at [facility], [place] [address] [place], [place] [address]   CBC with Differential/Platelet     Status: Abnormal   Collection Time: 01/08/22 12:10 PM  Result Value Ref Range   WBC 16.5 (H) 4.0 - 10.5  K/uL   RBC 5.04 4.22 - 5.81 MIL/uL   Hemoglobin 13.2 13.0 - 17.0 g/dL   HCT 42.6 39.0 - 52.0 %   MCV 84.5 80.0 - 100.0 fL   MCH 26.2 26.0 - 34.0 pg   MCHC 31.0 30.0 - 36.0 g/dL   RDW 17.1 (H) 11.5 - 15.5 %   Platelets 357 150 - 400 K/uL   nRBC 0.0 0.0 - 0.2 %   Neutrophils Relative % 63 %   Neutro Abs 10.2 (H) 1.7 - 7.7 K/uL   Lymphocytes Relative 22 %   Lymphs Abs 3.7 0.7 - 4.0 K/uL   Monocytes Relative 12 %   Monocytes Absolute 2.0 (H) 0.1 - 1.0 K/uL   Eosinophils Relative 3 %   Eosinophils Absolute 0.5 0.0 - 0.5 K/uL   Basophils Relative 0 %   Basophils Absolute 0.1 0.0 - 0.1 K/uL   Immature Granulocytes 0 %   Abs Immature Granulocytes 0.07 0.00 - 0.07 K/uL    Comment: Performed at [facility], [place] [address], [place], [place] [address]  Comprehensive metabolic panel     Status: Abnormal   Collection Time: 01/08/22 12:10 PM  Result Value Ref Range   Sodium 137 135 - 145 mmol/L   Potassium 4.5 3.5 - 5.1 mmol/L    Comment: MODERATE HEMOLYSIS   Chloride 96 (L) 98 - 111 mmol/L   CO2 25 22 - 32 mmol/L   Glucose, Bld 156 (H) 70 - 99 mg/dL    Comment: Glucose reference range applies only to samples taken after fasting for at least 8 hours.   BUN 18 8 - 23 mg/dL   Creatinine, Ser 1.50 (H) 0.61 - 1.24 mg/dL   Calcium 8.4 (L) 8.9 - 10.3 mg/dL   Total Protein 6.9 6.5 - 8.1 g/dL   Albumin 2.9 (L) 3.5 - 5.0 g/dL   AST 29 15 - 41 U/L   ALT 13 0 - 44 U/L   Alkaline Phosphatase 79 38 - 126 U/L   Total Bilirubin 1.6 (H) 0.3 - 1.2 mg/dL   GFR, Estimated 48 (L) >60 mL/min    Comment: (NOTE) Calculated using the CKD-EPI Creatinine Equation (2021)    Anion gap 16 (H) 5 - 15    Comment: Performed at [facility], [place] [address] [place], [place] [address]  Lipase, blood     Status: None   Collection Time: 01/08/22 12:10 PM  Result Value Ref Range   Lipase 22 11 - 51 U/L    Comment: HEMOLYSIS AT THIS LEVEL MAY AFFECT RESULT Performed at [facility] [address], [place], [place] [address]    *Note: Due to a large number of results and/or encounters for the requested time period, some results have not been displayed. A complete set of results can be found in Results Review.   US Abdomen Limited RUQ (LIVER/GB)  Result Date: 01/08/2022 CLINICAL DATA:  Right upper quadrant abdominal pain. EXAM: ULTRASOUND ABDOMEN LIMITED RIGHT UPPER QUADRANT COMPARISON:  CT of the abdomen pelvis 01/08/2022 FINDINGS: Gallbladder: Gallbladder wall thickening measures up to 6 mm. Multiple shadowing stones are present in the neck of the gallbladder. No sonographic Percell Miller sign was reported. Common bile duct: Diameter: 4.0 mm, within normal limits Liver: Liver is diffusely echogenic. No discrete lesions are present. Portal vein is patent on color Doppler imaging with normal direction of blood flow towards the liver. Other: None. IMPRESSION: 1. Cholelithiasis with gallbladder wall thickening. Findings are equivocal for acute cholecystitis. 2. Fatty infiltration of the liver. Electronically Signed   By: San Morelle M.D.   On: 01/08/2022 12:04   CT Chest Wo Contrast  Result Date: 01/08/2022 CLINICAL DATA:  History of renal cell carcinoma currently on treatment increased weakness, abdominal pain, nausea and vomiting EXAM: CT CHEST, ABDOMEN AND PELVIS WITHOUT CONTRAST TECHNIQUE: Multidetector CT imaging of the chest, abdomen and pelvis was performed following the standard protocol without IV contrast. RADIATION DOSE REDUCTION: This exam was performed according to the departmental dose-optimization program which includes automated exposure control, adjustment of the mA and/or kV according to patient size and/or use of iterative reconstruction technique. COMPARISON:  CT chest, abdomen, and pelvis dated 11/10/2021 FINDINGS: CT CHEST FINDINGS Cardiovascular: Right chest wall port tip terminates in the lower SVC. Normal heart size. No significant  pericardial fluid/thickening. Coronary artery calcifications. Great vessels are normal in course and caliber. Aortic atherosclerosis. Mediastinum/Nodes: thyroid gland without nodules meeting criteria for imaging follow-up by size. Normal esophagus. No pathologically enlarged axillary, supraclavicular, mediastinal, or hilar lymph nodes. Lungs/Pleura: There is mild respiratory motion artifact. The central airways are patent. No focal consolidation. Decreased conspicuity of previously noted right upper lobe nodules measuring average 4 mm (6:41) and 3 mm (6:65). No definite new or enlarging pulmonary nodules. No pneumothorax. No pleural effusion. Musculoskeletal: Similar multifocal osseous metastases, most notably mixed lytic and sclerotic appearance of T1 and T9 and similar expansile lytic lesion in right T11 costovertebral junction. Slightly increased sclerotic component of the left 4th rib (5:187). CT ABDOMEN PELVIS FINDINGS Hepatobiliary: Similar segment 4 cyst (2:53). Previously noted segment 3 cystic focus is not well seen. No new focal hepatic lesions. No intra or extrahepatic biliary ductal dilation. Distended gallbladder containing gallstones. Similar gallbladder fundal adenomyomatosis. New mild pericholecystic stranding. Pancreas: No pancreatic ductal dilatation or surrounding inflammatory changes. Spleen: Normal in size without focal abnormality. Adrenals/Urinary Tract: No adrenal nodules. No substantial change in size of treated left upper pole lesion measuring 2.2 x 2.0 cm (2:67). Unchanged subcentimeter exophytic focus along the anterior lower pole left kidney (2:81). No new or enlarging renal masses. No calculi or hydronephrosis. No focal bladder wall thickening. Stomach/Bowel: Normal appearance of the stomach. No evidence of bowel wall thickening, distention, or inflammatory changes. Normal appendix. Vascular/Lymphatic: No significant vascular  findings are present. No enlarged abdominal or pelvic lymph  nodes. Reproductive: Enlarged prostate gland. Other: Increased small volume free fluid.  No fluid collection. Musculoskeletal: Multifocal osseous metastases with increased soft tissue component of a lytic left acetabular metastasis, for example measuring 3.9 x 2.4 cm inferiorly (2:126), previously 3.2 x 2.2 cm. Unchanged compression of L1 and L4. IMPRESSION: 1. Distended gallbladder containing gallstones with new mild pericholecystic stranding, which may reflect acute cholecystitis. Recommend correlation with right upper quadrant ultrasound for further evaluation. 2. Increased small volume free fluid in the abdomen and pelvis. 3. Multifocal osseous metastases with increased soft tissue component of a lytic left acetabular metastasis and increased sclerotic component of a left 4th rib metastasis. 4. Decreased conspicuity of previously noted right upper lobe pulmonary nodules. No definite new or enlarging pulmonary nodules. 5. No substantial change in size of treated left upper pole renal lesion. 6. Aortic atherosclerosis. Aortic Atherosclerosis (ICD10-I70.0). Electronically Signed   By: Darrin Nipper M.D.   On: 01/08/2022 11:04   CT ABDOMEN PELVIS WO CONTRAST  Result Date: 01/08/2022 CLINICAL DATA:  History of renal cell carcinoma currently on treatment increased weakness, abdominal pain, nausea and vomiting EXAM: CT CHEST, ABDOMEN AND PELVIS WITHOUT CONTRAST TECHNIQUE: Multidetector CT imaging of the chest, abdomen and pelvis was performed following the standard protocol without IV contrast. RADIATION DOSE REDUCTION: This exam was performed according to the departmental dose-optimization program which includes automated exposure control, adjustment of the mA and/or kV according to patient size and/or use of iterative reconstruction technique. COMPARISON:  CT chest, abdomen, and pelvis dated 11/10/2021 FINDINGS: CT CHEST FINDINGS Cardiovascular: Right chest wall port tip terminates in the lower SVC. Normal heart  size. No significant pericardial fluid/thickening. Coronary artery calcifications. Great vessels are normal in course and caliber. Aortic atherosclerosis. Mediastinum/Nodes: thyroid gland without nodules meeting criteria for imaging follow-up by size. Normal esophagus. No pathologically enlarged axillary, supraclavicular, mediastinal, or hilar lymph nodes. Lungs/Pleura: There is mild respiratory motion artifact. The central airways are patent. No focal consolidation. Decreased conspicuity of previously noted right upper lobe nodules measuring average 4 mm (6:41) and 3 mm (6:65). No definite new or enlarging pulmonary nodules. No pneumothorax. No pleural effusion. Musculoskeletal: Similar multifocal osseous metastases, most notably mixed lytic and sclerotic appearance of T1 and T9 and similar expansile lytic lesion in right T11 costovertebral junction. Slightly increased sclerotic component of the left 4th rib (5:187). CT ABDOMEN PELVIS FINDINGS Hepatobiliary: Similar segment 4 cyst (2:53). Previously noted segment 3 cystic focus is not well seen. No new focal hepatic lesions. No intra or extrahepatic biliary ductal dilation. Distended gallbladder containing gallstones. Similar gallbladder fundal adenomyomatosis. New mild pericholecystic stranding. Pancreas: No pancreatic ductal dilatation or surrounding inflammatory changes. Spleen: Normal in size without focal abnormality. Adrenals/Urinary Tract: No adrenal nodules. No substantial change in size of treated left upper pole lesion measuring 2.2 x 2.0 cm (2:67). Unchanged subcentimeter exophytic focus along the anterior lower pole left kidney (2:81). No new or enlarging renal masses. No calculi or hydronephrosis. No focal bladder wall thickening. Stomach/Bowel: Normal appearance of the stomach. No evidence of bowel wall thickening, distention, or inflammatory changes. Normal appendix. Vascular/Lymphatic: No significant vascular findings are present. No enlarged  abdominal or pelvic lymph nodes. Reproductive: Enlarged prostate gland. Other: Increased small volume free fluid.  No fluid collection. Musculoskeletal: Multifocal osseous metastases with increased soft tissue component of a lytic left acetabular metastasis, for example measuring 3.9 x 2.4 cm inferiorly (2:126), previously 3.2 x 2.2 cm. Unchanged compression  of L1 and L4. IMPRESSION: 1. Distended gallbladder containing gallstones with new mild pericholecystic stranding, which may reflect acute cholecystitis. Recommend correlation with right upper quadrant ultrasound for further evaluation. 2. Increased small volume free fluid in the abdomen and pelvis. 3. Multifocal osseous metastases with increased soft tissue component of a lytic left acetabular metastasis and increased sclerotic component of a left 4th rib metastasis. 4. Decreased conspicuity of previously noted right upper lobe pulmonary nodules. No definite new or enlarging pulmonary nodules. 5. No substantial change in size of treated left upper pole renal lesion. 6. Aortic atherosclerosis. Aortic Atherosclerosis (ICD10-I70.0). Electronically Signed   By: Darrin Nipper M.D.   On: 01/08/2022 11:04   CT Head Wo Contrast  Result Date: 01/08/2022 CLINICAL DATA:  Worsening weakness.  Known brain metastases. EXAM: CT HEAD WITHOUT CONTRAST TECHNIQUE: Contiguous axial images were obtained from the base of the skull through the vertex without intravenous contrast. RADIATION DOSE REDUCTION: This exam was performed according to the departmental dose-optimization program which includes automated exposure control, adjustment of the mA and/or kV according to patient size and/or use of iterative reconstruction technique. COMPARISON:  Head MRI 01/02/2022 FINDINGS: Brain: There is no evidence of an acute infarct, acute intracranial hemorrhage, midline shift, or extra-axial fluid collection. Two known small metastases in the medial left frontal lobe are partially calcified.  Mild-to-moderate left frontal lobe edema is similar to the prior MRI. Smaller areas of edema or gliosis in the left temporal and left occipital lobes are unchanged. 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 There is a background of mild chronic small vessel ischemia elsewhere in the cerebral white matter. Vascular: Calcified atherosclerosis at the skull base. No hyperdense vessel. Skull: No fracture or suspicious osseous lesion. Sinuses/Orbits: Mild bilateral ethmoid air cell mucosal thickening. Clear mastoid air cells. Unremarkable orbits. Other: None. IMPRESSION: Known brain metastases without evidence of worsening edema, acute hemorrhage, or other acute intracranial abnormality. Electronically Signed   By: Logan Bores M.D.   On: 01/08/2022 10:36   DG Chest Port 1 View  Result Date: 01/08/2022 CLINICAL DATA:  76 year old male with history of hypoxia and weakness. History of renal cell carcinoma. EXAM: PORTABLE CHEST 1 VIEW COMPARISON:  No prior chest x-ray.  Chest CT 11/10/2021. FINDINGS: Right-sided internal jugular single-lumen Port-A-Cath with tip terminating at the superior cavoatrial junction. Lung volumes are normal. No consolidative airspace disease. No pleural effusions. No pneumothorax. No pulmonary nodule or mass noted. Pulmonary vasculature and the cardiomediastinal silhouette are within normal limits. Atherosclerotic calcifications in the thoracic aorta. IMPRESSION: 1. No radiographic evidence of acute cardiopulmonary disease. No definite signs of metastatic disease in the thorax. Electronically Signed   By: Vinnie Langton M.D.   On: 01/08/2022 09:38      Assessment/Plan Acute cholecystitis  - Patient with 2 days of abdominal pain, nausea, and worsening confusion. He has RUQ tenderness on exam and an u/s that shows gallbladder wall thickening with multiple shadowing stones present in the neck of the gallbladder. Work up concerning for acute cholecystitis.  Patient has multiple medical issues including Metastatic renal cell carcinoma actively on chemotherapy (pembrolizumab and lenvatinib), increasing his risks with general anesthesia and abdominal surgery. He is tachycardic and ill appearing. Recommend IR consult for percutaneous cholecystostomy tube placement. Continue IV antibiotics.   ID - zosyn VTE - ok for chemical dvt ppx after procedure from surgical standpoint FEN - IVF, NPO Foley - none  Metastatic renal cell carcinoma on chemotherapy (pembrolizumab and lenvatinib)  HTN CKD Chronic pain - takes oxycontin and oxycodone AKI  I reviewed ED provider notes, last 24 h vitals and pain scores, last 48 h intake and output, last 24 h labs and trends, and last 24 h imaging results.   Wellington Hampshire, [facility] 01/08/2022, 2:23 PM Please see Amion for pager number during day hours 7:00am-4:30pm

## 2022-01-09 ENCOUNTER — Other Ambulatory Visit (HOSPITAL_COMMUNITY): Payer: Self-pay

## 2022-01-09 ENCOUNTER — Inpatient Hospital Stay (HOSPITAL_COMMUNITY): Payer: PPO

## 2022-01-09 DIAGNOSIS — K810 Acute cholecystitis: Secondary | ICD-10-CM | POA: Diagnosis not present

## 2022-01-09 HISTORY — PX: IR PERC CHOLECYSTOSTOMY: IMG2326

## 2022-01-09 LAB — PROTIME-INR
INR: 1.3 — ABNORMAL HIGH (ref 0.8–1.2)
Prothrombin Time: 15.9 seconds — ABNORMAL HIGH (ref 11.4–15.2)

## 2022-01-09 LAB — CBC
HCT: 34.6 % — ABNORMAL LOW (ref 39.0–52.0)
Hemoglobin: 10.5 g/dL — ABNORMAL LOW (ref 13.0–17.0)
MCH: 25.9 pg — ABNORMAL LOW (ref 26.0–34.0)
MCHC: 30.3 g/dL (ref 30.0–36.0)
MCV: 85.4 fL (ref 80.0–100.0)
Platelets: 266 10*3/uL (ref 150–400)
RBC: 4.05 MIL/uL — ABNORMAL LOW (ref 4.22–5.81)
RDW: 17.2 % — ABNORMAL HIGH (ref 11.5–15.5)
WBC: 15.9 10*3/uL — ABNORMAL HIGH (ref 4.0–10.5)
nRBC: 0 % (ref 0.0–0.2)

## 2022-01-09 LAB — COMPREHENSIVE METABOLIC PANEL
ALT: 12 U/L (ref 0–44)
AST: 16 U/L (ref 15–41)
Albumin: 2.4 g/dL — ABNORMAL LOW (ref 3.5–5.0)
Alkaline Phosphatase: 82 U/L (ref 38–126)
Anion gap: 13 (ref 5–15)
BUN: 26 mg/dL — ABNORMAL HIGH (ref 8–23)
CO2: 25 mmol/L (ref 22–32)
Calcium: 8.1 mg/dL — ABNORMAL LOW (ref 8.9–10.3)
Chloride: 96 mmol/L — ABNORMAL LOW (ref 98–111)
Creatinine, Ser: 2.81 mg/dL — ABNORMAL HIGH (ref 0.61–1.24)
GFR, Estimated: 22 mL/min — ABNORMAL LOW (ref >60)
Glucose, Bld: 164 mg/dL — ABNORMAL HIGH (ref 70–99)
Potassium: 3.5 mmol/L (ref 3.5–5.1)
Sodium: 134 mmol/L — ABNORMAL LOW (ref 135–145)
Total Bilirubin: 0.8 mg/dL (ref 0.3–1.2)
Total Protein: 5.9 g/dL — ABNORMAL LOW (ref 6.5–8.1)

## 2022-01-09 LAB — PROCALCITONIN: Procalcitonin: 3.81 ng/mL

## 2022-01-09 LAB — CORTISOL-AM, BLOOD: Cortisol - AM: 12.5 ug/dL (ref 6.7–22.6)

## 2022-01-09 MED ORDER — LIDOCAINE HCL 1 % IJ SOLN
INTRAMUSCULAR | Status: AC
  Administered 2022-01-09: 10 mL via INTRADERMAL
  Filled 2022-01-09: qty 20

## 2022-01-09 MED ORDER — FENTANYL CITRATE (PF) 100 MCG/2ML IJ SOLN
INTRAMUSCULAR | Status: AC
  Filled 2022-01-09: qty 2

## 2022-01-09 MED ORDER — METOPROLOL TARTRATE 5 MG/5ML IV SOLN
2.5000 mg | Freq: Four times a day (QID) | INTRAVENOUS | Status: DC
  Administered 2022-01-09: 2.5 mg via INTRAVENOUS
  Filled 2022-01-09: qty 5

## 2022-01-09 MED ORDER — LIDOCAINE-EPINEPHRINE 1 %-1:100000 IJ SOLN
INTRAMUSCULAR | Status: AC | PRN
  Administered 2022-01-09: 20 mL

## 2022-01-09 MED ORDER — FENTANYL CITRATE (PF) 100 MCG/2ML IJ SOLN
INTRAMUSCULAR | Status: AC | PRN
  Administered 2022-01-09 (×2): 50 ug via INTRAVENOUS

## 2022-01-09 MED ORDER — SODIUM CHLORIDE 0.9% FLUSH
5.0000 mL | Freq: Three times a day (TID) | INTRAVENOUS | Status: DC
  Administered 2022-01-09 – 2022-01-11 (×6): 5 mL

## 2022-01-09 MED ORDER — MIDAZOLAM HCL 2 MG/2ML IJ SOLN
INTRAMUSCULAR | Status: AC | PRN
  Administered 2022-01-09: 1 mg via INTRAVENOUS

## 2022-01-09 MED ORDER — METOPROLOL TARTRATE 5 MG/5ML IV SOLN: 2.5000 mg | Freq: Four times a day (QID) | INTRAVENOUS | Status: DC | PRN

## 2022-01-09 MED ORDER — DIPHENHYDRAMINE HCL 50 MG/ML IJ SOLN
25.0000 mg | Freq: Once | INTRAMUSCULAR | Status: AC
  Administered 2022-01-09: 25 mg via INTRAVENOUS
  Filled 2022-01-09: qty 1

## 2022-01-09 MED ORDER — IOHEXOL 300 MG/ML  SOLN
50.0000 mL | Freq: Once | INTRAMUSCULAR | Status: AC | PRN
  Administered 2022-01-09: 10 mL

## 2022-01-09 MED ORDER — MIDAZOLAM HCL 2 MG/2ML IJ SOLN
INTRAMUSCULAR | Status: AC
  Filled 2022-01-09: qty 4

## 2022-01-09 NOTE — Procedures (Addendum)
Interventional Radiology Procedure Note  Procedure: Placement of perc chole drain, 58F  Specimen: cx sent EBL: none  Complications: None  Recommendations:  - To gravity drain.  Routine flushes per protocol - Do not submerge - Routine wound care  - OK to advance diet per primary order  Signed,  Dulcy Fanny. Earleen Newport, DO

## 2022-01-09 NOTE — Plan of Care (Signed)
  Problem: Fluid Volume: Goal: Hemodynamic stability will improve Outcome: Progressing   Problem: Respiratory: Goal: Ability to maintain adequate ventilation will improve Outcome: Progressing   Problem: Clinical Measurements: Goal: Diagnostic test results will improve Outcome: Progressing Goal: Respiratory complications will improve Outcome: Progressing Goal: Cardiovascular complication will be avoided Outcome: Progressing

## 2022-01-09 NOTE — Progress Notes (Signed)
Patient still lethargic, unable to take anything by mouth, the ordered prednisone for pre procedure not taken due to patient's mentation, on call provider J. Olena Heckle- NP notified. No new order given.

## 2022-01-09 NOTE — Progress Notes (Addendum)
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  History was obtained from patient's wife at bedside,  states patient had epigastric abdominal pain after eating some barbecue 2 days ago. He was nauseous but did not vomit.  There were no fevers noted.  Patient had similar episodes in the past and thought it was gas so he was given some Gas-X however symptoms did not improve.  He has not eaten in last two days,  he seems to become more confused as the day progressed so he was brought in the ED.  Patient was found to have acute cholecystitis, general surgery is consulted recommended IR consult for percutaneous cholecystostomy tube.  Assessment & Plan:   Principal Problem:   Acute cholecystitis Active Problems:   Lumbar spine metastasis   Renal cell carcinoma ([name])   HTN (hypertension)   Metastasis to brain [facility])   Seizure disorder ([name])   Sepsis ([place])   AKI (acute kidney injury) ([place])   Acute metabolic encephalopathy  Sepsis secondary to acute cholecystitis: Patient presented with tachypnea, leukocytosis, lactic acid 2.0, procalcitonin 3.81, AKI. RUQ ultrasound shows findings consistent with acute cholecystitis. Empirically started on IV Zosyn and IV hydration. Follow blood cultures, lactic acid improved with IV hydration. Surgery consulted recommended IR percutaneous drain. Patient is scheduled to have percutaneous cholecystostomy drain today.  Acute metabolic encephalopathy likely secondary to acute infection No focal neurological deficit noted on exam.  Patient with generalized weakness. He does not appear ictal.  Continue home seizure medications. Continue Keppra 250 mg twice daily. Mental status seems at his  baseline.  Acute kidney injury: Likely prerenal.  Baseline serum creatinine normal. Presented with serum creatinine 1.2.  Which is trended up 2.8 Continue IV fluid resuscitation.  Avoid nephrotoxic medication. No obstruction noted on imaging.  Renal cell carcinoma with mets to the brain and bones.: Continue follow-up with radiation oncology/heme-onc.  Outpatient  Essential hypertension: Holding blood pressure medications due to AKI. Continue hydralazine as needed.  Seizure disorder: Continue Keppra for 250 mg every 12 hours.  DVT prophylaxis:Heparin Code Status: DNR Family Communication: No family at bedside Disposition Plan:   Status is: Inpatient Remains inpatient appropriate because: Admitted for finding consistent with acute cholecystitis.  General surgery and IR is consulted,  Patient is scheduled to have percutaneous cholecystostomy tube placement today.  Patient is not medically clear.   Consultants:  General surgery IR  Procedures: Percutaneous cholecystostomy tube Antimicrobials:  Anti-infectives (From admission, onward)    Start     Dose/Rate Route Frequency Ordered Stop   01/08/22 2100  piperacillin-tazobactam (ZOSYN) IVPB 3.375 g        3.375 g 12.5 mL/hr over 240 Minutes Intravenous Every 8 hours 01/08/22 1527     01/08/22 1245  piperacillin-tazobactam (ZOSYN) IVPB 3.375 g        3.375 g 100 mL/hr over 30 Minutes Intravenous  Once 01/08/22 1241 01/08/22 1326       Subjective: Patient was seen and examined at bedside.  Overnight events noted.   Patient seems at his baseline mental status.  Patient is scheduled to have percutaneous cholecystostomy tube for acute cholecystitis today.  Objective: Vitals:   01/09/22 0529 01/09/22 0909 01/09/22 1103 01/09/22 1116  BP: (!) 147/91 [name])  137/50 128/78 130/61  Pulse: 75 66 (!) 161 (!) 142  Resp: 20 14    Temp: 98 F (36.7 C) 99.4 F (37.4 C)    TempSrc: Oral Oral    SpO2: 95% 93% 95%   Weight:       Height:        Intake/Output Summary (Last 24 hours) at 01/09/2022 1331 Last data filed at 01/09/2022 0645 Gross per 24 hour  Intake 1273.15 ml  Output --  Net 1273.15 ml   Filed Weights   01/08/22 0924  Weight: 113.4 kg   Examination: General exam: Appears comfortable, not in any acute distress.  Deconditioned. Respiratory system: CTA bilaterally, respiratory effort normal, RR 15. Cardiovascular system: S1 & S2 heard, regular rate and rhythm, no murmur. Gastrointestinal system: Abdomen is soft, non tender, mildly distended, BS+ Central nervous system: Alert and oriented x 3. No focal neurological deficits. Extremities: No edema, no cyanosis, no clubbing. Skin: No rashes, lesions or ulcers Psychiatry: Judgement and insight appear normal. Mood & affect appropriate.     Data Reviewed: I have personally reviewed following labs and imaging studies  CBC: Recent Labs  Lab 01/08/22 1210 01/09/22 0333  WBC 16.5* 15.9*  NEUTROABS 10.2*  --   HGB 13.2 10.5*  HCT 42.6 34.6*  MCV 84.5 85.4  PLT 357 416   Basic Metabolic Panel: Recent Labs  Lab 01/08/22 1210 01/09/22 0333  NA 137 134*  K 4.5 3.5  CL 96* 96*  CO2 25 25  GLUCOSE 156* 164*  BUN 18 26*  CREATININE 1.50* 2.81*  CALCIUM 8.4* 8.1*   GFR: Estimated Creatinine Clearance: 28.2 mL/min (A) (by C-G formula based on SCr of 2.81 mg/dL (H)). Liver Function Tests: Recent Labs  Lab 01/08/22 1210 01/09/22 0333  AST 29 16  ALT 13 12  ALKPHOS 79 82  BILITOT 1.6* 0.8  PROT 6.9 5.9*  ALBUMIN 2.9* 2.4*   Recent Labs  Lab 01/08/22 1210  LIPASE 22   No results for input(s): "AMMONIA" in the last 168 hours. Coagulation Profile: Recent Labs  Lab 01/08/22 1522 01/09/22 0333  INR 1.3* 1.3*   Cardiac Enzymes: No results for input(s): "CKTOTAL", "CKMB", "CKMBINDEX", "TROPONINI" in the last 168 hours. BNP (last 3 results) No results for input(s): "PROBNP" in the last 8760 hours. HbA1C: No results for  input(s): "HGBA1C" in the last 72 hours. CBG: Recent Labs  Lab 01/08/22 0922  GLUCAP 167*   Lipid Profile: No results for input(s): "CHOL", "HDL", "LDLCALC", "TRIG", "CHOLHDL", "LDLDIRECT" in the last 72 hours. Thyroid Function Tests: No results for input(s): "TSH", "T4TOTAL", "FREET4", "T3FREE", "THYROIDAB" in the last 72 hours. Anemia Panel: No results for input(s): "VITAMINB12", "FOLATE", "FERRITIN", "TIBC", "IRON", "RETICCTPCT" in the last 72 hours. Sepsis Labs: Recent Labs  Lab 01/08/22 0931 01/08/22 1522 01/09/22 0333  PROCALCITON  --   --  3.81  LATICACIDVEN 2.3* 2.0*  --     Recent Results (from the past 240 hour(s))  Blood culture (routine x 2)     Status: None (Preliminary result)   Collection Time: 01/08/22  5:08 PM   Specimen: BLOOD LEFT ARM  Result Value Ref Range Status   Specimen Description BLOOD LEFT ARM  Final   Special Requests   Final    BOTTLES DRAWN AEROBIC AND ANAEROBIC Blood Culture adequate volume   Culture   Final    NO GROWTH < 24 HOURS Performed at [facility], [place] [address], [place], [place] [address]  Report Status PENDING  Incomplete  Blood culture (routine x 2)     Status: None (Preliminary result)   Collection Time: 01/08/22  5:14 PM   Specimen: BLOOD  Result Value Ref Range Status   Specimen Description   Final    BLOOD SITE NOT SPECIFIED Performed at [place] [address] [place], [place] [address]    Special Requests   Final    BOTTLES DRAWN AEROBIC AND ANAEROBIC Blood Culture adequate volume Performed at [facility] [place] [address], [place], [place] [address]    Culture   Final    NO GROWTH < 24 HOURS Performed at [facility] [place] [address] [place], [place] [address]    Report Status PENDING  Incomplete    Radiology Studies: US Abdomen Limited RUQ (LIVER/GB)  Result Date: 01/08/2022 CLINICAL DATA:  Right upper quadrant abdominal pain. EXAM: ULTRASOUND  ABDOMEN LIMITED RIGHT UPPER QUADRANT COMPARISON:  CT of the abdomen pelvis 01/08/2022 FINDINGS: Gallbladder: Gallbladder wall thickening measures up to 6 mm. Multiple shadowing stones are present in the neck of the gallbladder. No sonographic Percell Miller sign was reported. Common bile duct: Diameter: 4.0 mm, within normal limits Liver: Liver is diffusely echogenic. No discrete lesions are present. Portal vein is patent on color Doppler imaging with normal direction of blood flow towards the liver. Other: None. IMPRESSION: 1. Cholelithiasis with gallbladder wall thickening. Findings are equivocal for acute cholecystitis. 2. Fatty infiltration of the liver. Electronically Signed   By: San Morelle M.D.   On: 01/08/2022 12:04   CT Chest Wo Contrast  Result Date: 01/08/2022 CLINICAL DATA:  History of renal cell carcinoma currently on treatment increased weakness, abdominal pain, nausea and vomiting EXAM: CT CHEST, ABDOMEN AND PELVIS WITHOUT CONTRAST TECHNIQUE: Multidetector CT imaging of the chest, abdomen and pelvis was performed following the standard protocol without IV contrast. RADIATION DOSE REDUCTION: This exam was performed according to the departmental dose-optimization program which includes automated exposure control, adjustment of the mA and/or kV according to patient size and/or use of iterative reconstruction technique. COMPARISON:  CT chest, abdomen, and pelvis dated 11/10/2021 FINDINGS: CT CHEST FINDINGS Cardiovascular: Right chest wall port tip terminates in the lower SVC. Normal heart size. No significant pericardial fluid/thickening. Coronary artery calcifications. Great vessels are normal in course and caliber. Aortic atherosclerosis. Mediastinum/Nodes: thyroid gland without nodules meeting criteria for imaging follow-up by size. Normal esophagus. No pathologically enlarged axillary, supraclavicular, mediastinal, or hilar lymph nodes. Lungs/Pleura: There is mild respiratory motion artifact.  The central airways are patent. No focal consolidation. Decreased conspicuity of previously noted right upper lobe nodules measuring average 4 mm (6:41) and 3 mm (6:65). No definite new or enlarging pulmonary nodules. No pneumothorax. No pleural effusion. Musculoskeletal: Similar multifocal osseous metastases, most notably mixed lytic and sclerotic appearance of T1 and T9 and similar expansile lytic lesion in right T11 costovertebral junction. Slightly increased sclerotic component of the left 4th rib (5:187). CT ABDOMEN PELVIS FINDINGS Hepatobiliary: Similar segment 4 cyst (2:53). Previously noted segment 3 cystic focus is not well seen. No new focal hepatic lesions. No intra or extrahepatic biliary ductal dilation. Distended gallbladder containing gallstones. Similar gallbladder fundal adenomyomatosis. New mild pericholecystic stranding. Pancreas: No pancreatic ductal dilatation or surrounding inflammatory changes. Spleen: Normal in size without focal abnormality. Adrenals/Urinary Tract: No adrenal nodules. No substantial change in size of treated left upper pole lesion measuring 2.2 x 2.0 cm (2:67). Unchanged subcentimeter exophytic focus along the anterior lower pole left  kidney (2:81). No new or enlarging renal masses. No calculi or hydronephrosis. No focal bladder wall thickening. Stomach/Bowel: Normal appearance of the stomach. No evidence of bowel wall thickening, distention, or inflammatory changes. Normal appendix. Vascular/Lymphatic: No significant vascular findings are present. No enlarged abdominal or pelvic lymph nodes. Reproductive: Enlarged prostate gland. Other: Increased small volume free fluid.  No fluid collection. Musculoskeletal: Multifocal osseous metastases with increased soft tissue component of a lytic left acetabular metastasis, for example measuring 3.9 x 2.4 cm inferiorly (2:126), previously 3.2 x 2.2 cm. Unchanged compression of L1 and L4. IMPRESSION: 1. Distended gallbladder containing  gallstones with new mild pericholecystic stranding, which may reflect acute cholecystitis. Recommend correlation with right upper quadrant ultrasound for further evaluation. 2. Increased small volume free fluid in the abdomen and pelvis. 3. Multifocal osseous metastases with increased soft tissue component of a lytic left acetabular metastasis and increased sclerotic component of a left 4th rib metastasis. 4. Decreased conspicuity of previously noted right upper lobe pulmonary nodules. No definite new or enlarging pulmonary nodules. 5. No substantial change in size of treated left upper pole renal lesion. 6. Aortic atherosclerosis. Aortic Atherosclerosis (ICD10-I70.0). Electronically Signed   By: Darrin Nipper M.D.   On: 01/08/2022 11:04   CT ABDOMEN PELVIS WO CONTRAST  Result Date: 01/08/2022 CLINICAL DATA:  History of renal cell carcinoma currently on treatment increased weakness, abdominal pain, nausea and vomiting EXAM: CT CHEST, ABDOMEN AND PELVIS WITHOUT CONTRAST TECHNIQUE: Multidetector CT imaging of the chest, abdomen and pelvis was performed following the standard protocol without IV contrast. RADIATION DOSE REDUCTION: This exam was performed according to the departmental dose-optimization program which includes automated exposure control, adjustment of the mA and/or kV according to patient size and/or use of iterative reconstruction technique. COMPARISON:  CT chest, abdomen, and pelvis dated 11/10/2021 FINDINGS: CT CHEST FINDINGS Cardiovascular: Right chest wall port tip terminates in the lower SVC. Normal heart size. No significant pericardial fluid/thickening. Coronary artery calcifications. Great vessels are normal in course and caliber. Aortic atherosclerosis. Mediastinum/Nodes: thyroid gland without nodules meeting criteria for imaging follow-up by size. Normal esophagus. No pathologically enlarged axillary, supraclavicular, mediastinal, or hilar lymph nodes. Lungs/Pleura: There is mild respiratory  motion artifact. The central airways are patent. No focal consolidation. Decreased conspicuity of previously noted right upper lobe nodules measuring average 4 mm (6:41) and 3 mm (6:65). No definite new or enlarging pulmonary nodules. No pneumothorax. No pleural effusion. Musculoskeletal: Similar multifocal osseous metastases, most notably mixed lytic and sclerotic appearance of T1 and T9 and similar expansile lytic lesion in right T11 costovertebral junction. Slightly increased sclerotic component of the left 4th rib (5:187). CT ABDOMEN PELVIS FINDINGS Hepatobiliary: Similar segment 4 cyst (2:53). Previously noted segment 3 cystic focus is not well seen. No new focal hepatic lesions. No intra or extrahepatic biliary ductal dilation. Distended gallbladder containing gallstones. Similar gallbladder fundal adenomyomatosis. New mild pericholecystic stranding. Pancreas: No pancreatic ductal dilatation or surrounding inflammatory changes. Spleen: Normal in size without focal abnormality. Adrenals/Urinary Tract: No adrenal nodules. No substantial change in size of treated left upper pole lesion measuring 2.2 x 2.0 cm (2:67). Unchanged subcentimeter exophytic focus along the anterior lower pole left kidney (2:81). No new or enlarging renal masses. No calculi or hydronephrosis. No focal bladder wall thickening. Stomach/Bowel: Normal appearance of the stomach. No evidence of bowel wall thickening, distention, or inflammatory changes. Normal appendix. Vascular/Lymphatic: No significant vascular findings are present. No enlarged abdominal or pelvic lymph nodes. Reproductive: Enlarged prostate gland. Other: Increased small  volume free fluid.  No fluid collection. Musculoskeletal: Multifocal osseous metastases with increased soft tissue component of a lytic left acetabular metastasis, for example measuring 3.9 x 2.4 cm inferiorly (2:126), previously 3.2 x 2.2 cm. Unchanged compression of L1 and L4. IMPRESSION: 1. Distended  gallbladder containing gallstones with new mild pericholecystic stranding, which may reflect acute cholecystitis. Recommend correlation with right upper quadrant ultrasound for further evaluation. 2. Increased small volume free fluid in the abdomen and pelvis. 3. Multifocal osseous metastases with increased soft tissue component of a lytic left acetabular metastasis and increased sclerotic component of a left 4th rib metastasis. 4. Decreased conspicuity of previously noted right upper lobe pulmonary nodules. No definite new or enlarging pulmonary nodules. 5. No substantial change in size of treated left upper pole renal lesion. 6. Aortic atherosclerosis. Aortic Atherosclerosis (ICD10-I70.0). Electronically Signed   By: Darrin Nipper M.D.   On: 01/08/2022 11:04   CT Head Wo Contrast  Result Date: 01/08/2022 CLINICAL DATA:  Worsening weakness.  Known brain metastases. EXAM: CT HEAD WITHOUT CONTRAST TECHNIQUE: Contiguous axial images were obtained from the base of the skull through the vertex without intravenous contrast. RADIATION DOSE REDUCTION: This exam was performed according to the departmental dose-optimization program which includes automated exposure control, adjustment of the mA and/or kV according to patient size and/or use of iterative reconstruction technique. COMPARISON:  Head MRI 01/02/2022 FINDINGS: Brain: There is no evidence of an acute infarct, acute intracranial hemorrhage, midline shift, or extra-axial fluid collection. Two known small metastases in the medial left frontal lobe are partially calcified. Mild-to-moderate left frontal lobe edema is similar to the prior MRI. Smaller areas of edema or gliosis in the left temporal and left occipital lobes are unchanged. 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 There is a background of mild chronic small vessel ischemia elsewhere in the cerebral white matter. Vascular: Calcified atherosclerosis at the skull base. No hyperdense  vessel. Skull: No fracture or suspicious osseous lesion. Sinuses/Orbits: Mild bilateral ethmoid air cell mucosal thickening. Clear mastoid air cells. Unremarkable orbits. Other: None. IMPRESSION: Known brain metastases without evidence of worsening edema, acute hemorrhage, or other acute intracranial abnormality. Electronically Signed   By: Logan Bores M.D.   On: 01/08/2022 10:36   DG Chest Port 1 View  Result Date: 01/08/2022 CLINICAL DATA:  76 year old male with history of hypoxia and weakness. History of renal cell carcinoma. EXAM: PORTABLE CHEST 1 VIEW COMPARISON:  No prior chest x-ray.  Chest CT 11/10/2021. FINDINGS: Right-sided internal jugular single-lumen Port-A-Cath with tip terminating at the superior cavoatrial junction. Lung volumes are normal. No consolidative airspace disease. No pleural effusions. No pneumothorax. No pulmonary nodule or mass noted. Pulmonary vasculature and the cardiomediastinal silhouette are within normal limits. Atherosclerotic calcifications in the thoracic aorta. IMPRESSION: 1. No radiographic evidence of acute cardiopulmonary disease. No definite signs of metastatic disease in the thorax. Electronically Signed   By: Vinnie Langton M.D.   On: 01/08/2022 09:38    Scheduled Meds:  Chlorhexidine Gluconate Cloth  6 each Topical Daily   dorzolamide  1 drop Right Eye TID   fentaNYL       hydrocortisone sod succinate (SOLU-CORTEF) inj  30 mg Intravenous QAC breakfast   lidocaine       metoprolol tartrate  2.5 mg Intravenous Q6H   midazolam       predniSONE  50 mg Oral Q6H   sodium chloride flush  10-40 mL Intracatheter Q12H   Continuous Infusions:  lactated  ringers 125 mL/hr at 01/09/22 0955   levETIRAcetam 250 mg (01/09/22 0959)   piperacillin-tazobactam (ZOSYN)  IV 3.375 g (01/09/22 0654)     LOS: 1 day    Time spent: 38 mins    Reeshemah Nazaryan, MD [facility]   If 7PM-7AM, please contact night-coverage

## 2022-01-09 NOTE — Progress Notes (Signed)
Progress Note     Subjective: Pt lethargic this AM. Does awaken briefly and nods yes to abdominal pain. No family present at bedside.   Objective: Vital signs in last 24 hours: Temp:  [98 F (36.7 C)-100.3 F (37.9 C)] 99.4 F (37.4 C) (11/14 0909) Pulse Rate:  [50-87] 66 (11/14 0909) Resp:  [14-37] 14 (11/14 0909) BP: (103-148)/(47-91) 137/50 (11/14 0909) SpO2:  [91 %-96 %] 93 % (11/14 0909) Last BM Date : 01/09/22  Intake/Output from previous day: 11/13 0701 - 11/14 0700 In: 2323.2 [I.V.:1123.2; IV Piggyback:1200] Out: -  Intake/Output this shift: No intake/output data recorded.  PE: General: WD, elderly male who is laying in bed in NAD HEENT: sclera anicteric Heart: regular, rate, and rhythm.   Lungs: CTAB, no wheezes, rhonchi, or rales noted.  Respiratory effort nonlabored Abd: soft, mild ttp in RUQ, ND, +BS    Lab Results:  Recent Labs    01/08/22 1210 01/09/22 0333  WBC 16.5* 15.9*  HGB 13.2 10.5*  HCT 42.6 34.6*  PLT 357 266   BMET Recent Labs    01/08/22 1210 01/09/22 0333  NA 137 134*  K 4.5 3.5  CL 96* 96*  CO2 25 25  GLUCOSE 156* 164*  BUN 18 26*  CREATININE 1.50* 2.81*  CALCIUM 8.4* 8.1*   PT/INR Recent Labs    01/08/22 1522 01/09/22 0333  LABPROT 15.7* 15.9*  INR 1.3* 1.3*   CMP     Component Value Date/Time   NA 134 (L) 01/09/2022 0333   NA 140 02/21/2017 1128   K 3.5 01/09/2022 0333   K 4.3 02/21/2017 1128   CL 96 (L) 01/09/2022 0333   CO2 25 01/09/2022 0333   CO2 26 02/21/2017 1128   GLUCOSE 164 (H) 01/09/2022 0333   GLUCOSE 115 02/21/2017 1128   BUN 26 (H) 01/09/2022 0333   BUN 16.6 02/21/2017 1128   CREATININE 2.81 (H) 01/09/2022 0333   CREATININE 0.82 12/27/2021 1017   CREATININE 0.8 02/21/2017 1128   CALCIUM 8.1 (L) 01/09/2022 0333   CALCIUM 9.4 02/21/2017 1128   PROT 5.9 (L) 01/09/2022 0333   PROT 7.5 02/21/2017 1128   ALBUMIN 2.4 (L) 01/09/2022 0333   ALBUMIN 4.2 02/21/2017 1128   AST 16 01/09/2022 0333    AST 10 (L) 12/27/2021 1017   AST 20 02/21/2017 1128   ALT 12 01/09/2022 0333   ALT 11 12/27/2021 1017   ALT 43 02/21/2017 1128   ALKPHOS 82 01/09/2022 0333   ALKPHOS 104 02/21/2017 1128   BILITOT 0.8 01/09/2022 0333   BILITOT 0.2 (L) 12/27/2021 1017   BILITOT 0.36 02/21/2017 1128   GFRNONAA 22 (L) 01/09/2022 0333   GFRNONAA >60 12/27/2021 1017   GFRAA >60 11/05/2019 0957   Lipase     Component Value Date/Time   LIPASE 22 01/08/2022 1210       Studies/Results: US Abdomen Limited RUQ (LIVER/GB)  Result Date: 01/08/2022 CLINICAL DATA:  Right upper quadrant abdominal pain. EXAM: ULTRASOUND ABDOMEN LIMITED RIGHT UPPER QUADRANT COMPARISON:  CT of the abdomen pelvis 01/08/2022 FINDINGS: Gallbladder: Gallbladder wall thickening measures up to 6 mm. Multiple shadowing stones are present in the neck of the gallbladder. No sonographic Percell Miller sign was reported. Common bile duct: Diameter: 4.0 mm, within normal limits Liver: Liver is diffusely echogenic. No discrete lesions are present. Portal vein is patent on color Doppler imaging with normal direction of blood flow towards the liver. Other: None. IMPRESSION: 1. Cholelithiasis with gallbladder wall thickening. Findings are  equivocal for acute cholecystitis. 2. Fatty infiltration of the liver. Electronically Signed   By: San Morelle M.D.   On: 01/08/2022 12:04   CT Chest Wo Contrast  Result Date: 01/08/2022 CLINICAL DATA:  History of renal cell carcinoma currently on treatment increased weakness, abdominal pain, nausea and vomiting EXAM: CT CHEST, ABDOMEN AND PELVIS WITHOUT CONTRAST TECHNIQUE: Multidetector CT imaging of the chest, abdomen and pelvis was performed following the standard protocol without IV contrast. RADIATION DOSE REDUCTION: This exam was performed according to the departmental dose-optimization program which includes automated exposure control, adjustment of the mA and/or kV according to patient size and/or use of  iterative reconstruction technique. COMPARISON:  CT chest, abdomen, and pelvis dated 11/10/2021 FINDINGS: CT CHEST FINDINGS Cardiovascular: Right chest wall port tip terminates in the lower SVC. Normal heart size. No significant pericardial fluid/thickening. Coronary artery calcifications. Great vessels are normal in course and caliber. Aortic atherosclerosis. Mediastinum/Nodes: thyroid gland without nodules meeting criteria for imaging follow-up by size. Normal esophagus. No pathologically enlarged axillary, supraclavicular, mediastinal, or hilar lymph nodes. Lungs/Pleura: There is mild respiratory motion artifact. The central airways are patent. No focal consolidation. Decreased conspicuity of previously noted right upper lobe nodules measuring average 4 mm (6:41) and 3 mm (6:65). No definite new or enlarging pulmonary nodules. No pneumothorax. No pleural effusion. Musculoskeletal: Similar multifocal osseous metastases, most notably mixed lytic and sclerotic appearance of T1 and T9 and similar expansile lytic lesion in right T11 costovertebral junction. Slightly increased sclerotic component of the left 4th rib (5:187). CT ABDOMEN PELVIS FINDINGS Hepatobiliary: Similar segment 4 cyst (2:53). Previously noted segment 3 cystic focus is not well seen. No new focal hepatic lesions. No intra or extrahepatic biliary ductal dilation. Distended gallbladder containing gallstones. Similar gallbladder fundal adenomyomatosis. New mild pericholecystic stranding. Pancreas: No pancreatic ductal dilatation or surrounding inflammatory changes. Spleen: Normal in size without focal abnormality. Adrenals/Urinary Tract: No adrenal nodules. No substantial change in size of treated left upper pole lesion measuring 2.2 x 2.0 cm (2:67). Unchanged subcentimeter exophytic focus along the anterior lower pole left kidney (2:81). No new or enlarging renal masses. No calculi or hydronephrosis. No focal bladder wall thickening. Stomach/Bowel:  Normal appearance of the stomach. No evidence of bowel wall thickening, distention, or inflammatory changes. Normal appendix. Vascular/Lymphatic: No significant vascular findings are present. No enlarged abdominal or pelvic lymph nodes. Reproductive: Enlarged prostate gland. Other: Increased small volume free fluid.  No fluid collection. Musculoskeletal: Multifocal osseous metastases with increased soft tissue component of a lytic left acetabular metastasis, for example measuring 3.9 x 2.4 cm inferiorly (2:126), previously 3.2 x 2.2 cm. Unchanged compression of L1 and L4. IMPRESSION: 1. Distended gallbladder containing gallstones with new mild pericholecystic stranding, which may reflect acute cholecystitis. Recommend correlation with right upper quadrant ultrasound for further evaluation. 2. Increased small volume free fluid in the abdomen and pelvis. 3. Multifocal osseous metastases with increased soft tissue component of a lytic left acetabular metastasis and increased sclerotic component of a left 4th rib metastasis. 4. Decreased conspicuity of previously noted right upper lobe pulmonary nodules. No definite new or enlarging pulmonary nodules. 5. No substantial change in size of treated left upper pole renal lesion. 6. Aortic atherosclerosis. Aortic Atherosclerosis (ICD10-I70.0). Electronically Signed   By: Darrin Nipper M.D.   On: 01/08/2022 11:04   CT ABDOMEN PELVIS WO CONTRAST  Result Date: 01/08/2022 CLINICAL DATA:  History of renal cell carcinoma currently on treatment increased weakness, abdominal pain, nausea and vomiting EXAM: CT CHEST, ABDOMEN  AND PELVIS WITHOUT CONTRAST TECHNIQUE: Multidetector CT imaging of the chest, abdomen and pelvis was performed following the standard protocol without IV contrast. RADIATION DOSE REDUCTION: This exam was performed according to the departmental dose-optimization program which includes automated exposure control, adjustment of the mA and/or kV according to patient  size and/or use of iterative reconstruction technique. COMPARISON:  CT chest, abdomen, and pelvis dated 11/10/2021 FINDINGS: CT CHEST FINDINGS Cardiovascular: Right chest wall port tip terminates in the lower SVC. Normal heart size. No significant pericardial fluid/thickening. Coronary artery calcifications. Great vessels are normal in course and caliber. Aortic atherosclerosis. Mediastinum/Nodes: thyroid gland without nodules meeting criteria for imaging follow-up by size. Normal esophagus. No pathologically enlarged axillary, supraclavicular, mediastinal, or hilar lymph nodes. Lungs/Pleura: There is mild respiratory motion artifact. The central airways are patent. No focal consolidation. Decreased conspicuity of previously noted right upper lobe nodules measuring average 4 mm (6:41) and 3 mm (6:65). No definite new or enlarging pulmonary nodules. No pneumothorax. No pleural effusion. Musculoskeletal: Similar multifocal osseous metastases, most notably mixed lytic and sclerotic appearance of T1 and T9 and similar expansile lytic lesion in right T11 costovertebral junction. Slightly increased sclerotic component of the left 4th rib (5:187). CT ABDOMEN PELVIS FINDINGS Hepatobiliary: Similar segment 4 cyst (2:53). Previously noted segment 3 cystic focus is not well seen. No new focal hepatic lesions. No intra or extrahepatic biliary ductal dilation. Distended gallbladder containing gallstones. Similar gallbladder fundal adenomyomatosis. New mild pericholecystic stranding. Pancreas: No pancreatic ductal dilatation or surrounding inflammatory changes. Spleen: Normal in size without focal abnormality. Adrenals/Urinary Tract: No adrenal nodules. No substantial change in size of treated left upper pole lesion measuring 2.2 x 2.0 cm (2:67). Unchanged subcentimeter exophytic focus along the anterior lower pole left kidney (2:81). No new or enlarging renal masses. No calculi or hydronephrosis. No focal bladder wall  thickening. Stomach/Bowel: Normal appearance of the stomach. No evidence of bowel wall thickening, distention, or inflammatory changes. Normal appendix. Vascular/Lymphatic: No significant vascular findings are present. No enlarged abdominal or pelvic lymph nodes. Reproductive: Enlarged prostate gland. Other: Increased small volume free fluid.  No fluid collection. Musculoskeletal: Multifocal osseous metastases with increased soft tissue component of a lytic left acetabular metastasis, for example measuring 3.9 x 2.4 cm inferiorly (2:126), previously 3.2 x 2.2 cm. Unchanged compression of L1 and L4. IMPRESSION: 1. Distended gallbladder containing gallstones with new mild pericholecystic stranding, which may reflect acute cholecystitis. Recommend correlation with right upper quadrant ultrasound for further evaluation. 2. Increased small volume free fluid in the abdomen and pelvis. 3. Multifocal osseous metastases with increased soft tissue component of a lytic left acetabular metastasis and increased sclerotic component of a left 4th rib metastasis. 4. Decreased conspicuity of previously noted right upper lobe pulmonary nodules. No definite new or enlarging pulmonary nodules. 5. No substantial change in size of treated left upper pole renal lesion. 6. Aortic atherosclerosis. Aortic Atherosclerosis (ICD10-I70.0). Electronically Signed   By: Darrin Nipper M.D.   On: 01/08/2022 11:04   CT Head Wo Contrast  Result Date: 01/08/2022 CLINICAL DATA:  Worsening weakness.  Known brain metastases. EXAM: CT HEAD WITHOUT CONTRAST TECHNIQUE: Contiguous axial images were obtained from the base of the skull through the vertex without intravenous contrast. RADIATION DOSE REDUCTION: This exam was performed according to the departmental dose-optimization program which includes automated exposure control, adjustment of the mA and/or kV according to patient size and/or use of iterative reconstruction technique. COMPARISON:  Head MRI  01/02/2022 FINDINGS: Brain: There is no evidence of  an acute infarct, acute intracranial hemorrhage, midline shift, or extra-axial fluid collection. Two known small metastases in the medial left frontal lobe are partially calcified. Mild-to-moderate left frontal lobe edema is similar to the prior MRI. Smaller areas of edema or gliosis in the left temporal and left occipital lobes are unchanged. 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 There is a background of mild chronic small vessel ischemia elsewhere in the cerebral white matter. Vascular: Calcified atherosclerosis at the skull base. No hyperdense vessel. Skull: No fracture or suspicious osseous lesion. Sinuses/Orbits: Mild bilateral ethmoid air cell mucosal thickening. Clear mastoid air cells. Unremarkable orbits. Other: None. IMPRESSION: Known brain metastases without evidence of worsening edema, acute hemorrhage, or other acute intracranial abnormality. Electronically Signed   By: Logan Bores M.D.   On: 01/08/2022 10:36   DG Chest Port 1 View  Result Date: 01/08/2022 CLINICAL DATA:  76 year old male with history of hypoxia and weakness. History of renal cell carcinoma. EXAM: PORTABLE CHEST 1 VIEW COMPARISON:  No prior chest x-ray.  Chest CT 11/10/2021. FINDINGS: Right-sided internal jugular single-lumen Port-A-Cath with tip terminating at the superior cavoatrial junction. Lung volumes are normal. No consolidative airspace disease. No pleural effusions. No pneumothorax. No pulmonary nodule or mass noted. Pulmonary vasculature and the cardiomediastinal silhouette are within normal limits. Atherosclerotic calcifications in the thoracic aorta. IMPRESSION: 1. No radiographic evidence of acute cardiopulmonary disease. No definite signs of metastatic disease in the thorax. Electronically Signed   By: Vinnie Langton M.D.   On: 01/08/2022 09:38    Anti-infectives: Anti-infectives (From admission, onward)    Start     Dose/Rate  Route Frequency Ordered Stop   01/08/22 2100  piperacillin-tazobactam (ZOSYN) IVPB 3.375 g        3.375 g 12.5 mL/hr over 240 Minutes Intravenous Every 8 hours 01/08/22 1527     01/08/22 1245  piperacillin-tazobactam (ZOSYN) IVPB 3.375 g        3.375 g 100 mL/hr over 30 Minutes Intravenous  Once 01/08/22 1241 01/08/22 1326        Assessment/Plan  Acute cholecystitis  - Patient has multiple medical issues including Metastatic renal cell carcinoma actively on chemotherapy (pembrolizumab and lenvatinib), increasing his risks with general anesthesia and abdominal surgery. - IR planning percutaneous cholecystostomy today  - LFTs normal  - WBC 15.9 from 16.5, HD stable, continue IV abx   ID - zosyn VTE - ok for chemical dvt ppx after procedure from surgical standpoint FEN - IVF, NPO Foley - none   Metastatic renal cell carcinoma on chemotherapy (pembrolizumab and lenvatinib) HTN CKD Chronic pain - takes oxycontin and oxycodone AKI   LOS: 1 day   I reviewed Consultant IR notes, hospitalist notes, last 24 h vitals and pain scores, last 48 h intake and output, last 24 h labs and trends, and last 24 h imaging results.    Norm Parcel, [facility] Surgery 01/09/2022, 10:26 AM Please see Amion for pager number during day hours 7:00am-4:30pm

## 2022-01-09 NOTE — Progress Notes (Signed)
  Transition of Care [facility]) Screening Note   Patient Details  Name: Phillip [name] Date of Birth: [birth year]   Transition of Care [facility]) CM/SW Contact:    Dessa Phi, RN Phone Number: 01/09/2022, 1:15 PM    Transition of Care Department [facility]) has reviewed patient and no TOC needs have been identified at this time. We will continue to monitor patient advancement through interdisciplinary progression rounds. If new patient transition needs arise, please place a TOC consult.

## 2022-01-09 NOTE — Progress Notes (Signed)
Patient more alert but still confused, able to take his prednisone, first dose started at 0356AM.

## 2022-01-10 DIAGNOSIS — K810 Acute cholecystitis: Secondary | ICD-10-CM | POA: Diagnosis not present

## 2022-01-10 LAB — PHOSPHORUS: Phosphorus: 2.2 mg/dL — ABNORMAL LOW (ref 2.5–4.6)

## 2022-01-10 LAB — COMPREHENSIVE METABOLIC PANEL
ALT: 14 U/L (ref 0–44)
AST: 20 U/L (ref 15–41)
Albumin: 2.2 g/dL — ABNORMAL LOW (ref 3.5–5.0)
Alkaline Phosphatase: 56 U/L (ref 38–126)
Anion gap: 10 (ref 5–15)
BUN: 31 mg/dL — ABNORMAL HIGH (ref 8–23)
CO2: 27 mmol/L (ref 22–32)
Calcium: 7.7 mg/dL — ABNORMAL LOW (ref 8.9–10.3)
Chloride: 100 mmol/L (ref 98–111)
Creatinine, Ser: 1.92 mg/dL — ABNORMAL HIGH (ref 0.61–1.24)
GFR, Estimated: 35 mL/min — ABNORMAL LOW (ref >60)
Glucose, Bld: 199 mg/dL — ABNORMAL HIGH (ref 70–99)
Potassium: 3.6 mmol/L (ref 3.5–5.1)
Sodium: 137 mmol/L (ref 135–145)
Total Bilirubin: 0.4 mg/dL (ref 0.3–1.2)
Total Protein: 5.6 g/dL — ABNORMAL LOW (ref 6.5–8.1)

## 2022-01-10 LAB — CBC
HCT: 33 % — ABNORMAL LOW (ref 39.0–52.0)
Hemoglobin: 10.1 g/dL — ABNORMAL LOW (ref 13.0–17.0)
MCH: 25.9 pg — ABNORMAL LOW (ref 26.0–34.0)
MCHC: 30.6 g/dL (ref 30.0–36.0)
MCV: 84.6 fL (ref 80.0–100.0)
Platelets: 285 10*3/uL (ref 150–400)
RBC: 3.9 MIL/uL — ABNORMAL LOW (ref 4.22–5.81)
RDW: 17.5 % — ABNORMAL HIGH (ref 11.5–15.5)
WBC: 11 10*3/uL — ABNORMAL HIGH (ref 4.0–10.5)
nRBC: 0 % (ref 0.0–0.2)

## 2022-01-10 LAB — MAGNESIUM: Magnesium: 1.7 mg/dL (ref 1.7–2.4)

## 2022-01-10 MED ORDER — CALCIUM CARBONATE ANTACID 500 MG PO CHEW
400.0000 mg | CHEWABLE_TABLET | Freq: Three times a day (TID) | ORAL | Status: AC
  Administered 2022-01-10 (×2): 400 mg via ORAL
  Filled 2022-01-10 (×3): qty 2

## 2022-01-10 MED ORDER — MAGNESIUM SULFATE 50 % IJ SOLN: 1.0000 g | Freq: Once | INTRAMUSCULAR | Status: DC

## 2022-01-10 MED ORDER — MAGNESIUM SULFATE 2 GM/50ML IV SOLN
2.0000 g | Freq: Once | INTRAVENOUS | Status: AC
  Administered 2022-01-10: 2 g via INTRAVENOUS
  Filled 2022-01-10: qty 50

## 2022-01-10 NOTE — Progress Notes (Signed)
Progress Note     Subjective: Pt much more alert this AM. Reports some RUQ pain. Denies n/v.   Objective: Vital signs in last 24 hours: Temp:  [97.7 F (36.5 C)-99.7 F (37.6 C)] 98 F (36.7 C) (11/15 0338) Pulse Rate:  [62-161] 62 (11/15 0338) Resp:  [18-24] 18 (11/15 0338) BP: (113-144)/(58-91) 144/63 (11/15 0338) SpO2:  [94 %-98 %] 96 % (11/15 0338) Last BM Date : 01/10/22  Intake/Output from previous day: 11/14 0701 - 11/15 0700 In: 3455.3 [I.V.:3089.7; IV Piggyback:355.6] Out: 1081 [Urine:850; Drains:230; Stool:1] Intake/Output this shift: Total I/O In: -  Out: 35 [Drains:40]  PE: General: WD, elderly male who is laying in bed in NAD HEENT: sclera anicteric Heart: regular, rate, and rhythm.   Lungs: CTAB, no wheezes, rhonchi, or rales noted.  Respiratory effort nonlabored Abd: soft, mild ttp in RUQ, ND, +BS, drain in RUQ with bilious fluid     Lab Results:  Recent Labs    01/09/22 0333 01/10/22 0307  WBC 15.9* 11.0*  HGB 10.5* 10.1*  HCT 34.6* 33.0*  PLT 266 285    BMET Recent Labs    01/09/22 0333 01/10/22 0307  NA 134* 137  K 3.5 3.6  CL 96* 100  CO2 25 27  GLUCOSE 164* 199*  BUN 26* 31*  CREATININE 2.81* 1.92*  CALCIUM 8.1* 7.7*    PT/INR Recent Labs    01/08/22 1522 01/09/22 0333  LABPROT 15.7* 15.9*  INR 1.3* 1.3*    CMP     Component Value Date/Time   NA 137 01/10/2022 0307   NA 140 02/21/2017 1128   K 3.6 01/10/2022 0307   K 4.3 02/21/2017 1128   CL 100 01/10/2022 0307   CO2 27 01/10/2022 0307   CO2 26 02/21/2017 1128   GLUCOSE 199 (H) 01/10/2022 0307   GLUCOSE 115 02/21/2017 1128   BUN 31 (H) 01/10/2022 0307   BUN 16.6 02/21/2017 1128   CREATININE 1.92 (H) 01/10/2022 0307   CREATININE 0.82 12/27/2021 1017   CREATININE 0.8 02/21/2017 1128   CALCIUM 7.7 (L) 01/10/2022 0307   CALCIUM 9.4 02/21/2017 1128   PROT 5.6 (L) 01/10/2022 0307   PROT 7.5 02/21/2017 1128   ALBUMIN 2.2 (L) 01/10/2022 0307   ALBUMIN 4.2  02/21/2017 1128   AST 20 01/10/2022 0307   AST 10 (L) 12/27/2021 1017   AST 20 02/21/2017 1128   ALT 14 01/10/2022 0307   ALT 11 12/27/2021 1017   ALT 43 02/21/2017 1128   ALKPHOS 56 01/10/2022 0307   ALKPHOS 104 02/21/2017 1128   BILITOT 0.4 01/10/2022 0307   BILITOT 0.2 (L) 12/27/2021 1017   BILITOT 0.36 02/21/2017 1128   GFRNONAA 35 (L) 01/10/2022 0307   GFRNONAA >60 12/27/2021 1017   GFRAA >60 11/05/2019 0957   Lipase     Component Value Date/Time   LIPASE 22 01/08/2022 1210       Studies/Results: IR Perc Cholecystostomy  Result Date: 01/09/2022 INDICATION: 76 year old male referred for percutaneous cholecystostomy EXAM: CHOLECYSTOSTOMY MEDICATIONS: None ANESTHESIA/SEDATION: Moderate (conscious) sedation was employed during this procedure. A total of Versed 0.5 mg and Fentanyl 100 mcg was administered intravenously. Moderate Sedation Time: 22 minutes. The patient's level of consciousness and vital signs were monitored continuously by radiology nursing throughout the procedure under my direct supervision. FLUOROSCOPY TIME:  Fluoroscopy Time: 1 minutes 6 seconds (19 mGy). COMPLICATIONS: None PROCEDURE: Informed written consent was obtained from the patient and the patient's family after a thorough discussion of the  procedural risks, benefits and alternatives. All questions were addressed. Maximal Sterile Barrier Technique was utilized including caps, mask, sterile gowns, sterile gloves, sterile drape, hand hygiene and skin antiseptic. A timeout was performed prior to the initiation of the procedure. Ultrasound survey of the right upper quadrant was performed for planning purposes. Once the patient is prepped and draped in the usual sterile fashion, the skin and subcutaneous tissues overlying the gallbladder were generously infiltrated 1% lidocaine for local anesthesia. A coaxial needle was advanced under ultrasound guidance through the skin subcutaneous tissues and a small segment of  liver into the gallbladder lumen. With removal of the stylet, spontaneous dark bile drainage occurred. Using modified Seldinger technique, a 10 French drain was placed into the gallbladder fossa, with aspiration of the sample for the lab. Contrast injection confirmed position of the tube within the gallbladder lumen. Drainage catheter was attached to gravity drain with a suture retention placed. Patient tolerated the procedure well and remained hemodynamically stable throughout. No complications were encountered and no significant blood loss encountered. IMPRESSION: Status post percutaneous cholecystostomy Signed, Dulcy Fanny. Nadene Rubins, RPVI [facility] Radiology Electronically Signed   By: Corrie Mckusick D.O.   On: 01/09/2022 14:10   US Abdomen Limited RUQ (LIVER/GB)  Result Date: 01/08/2022 CLINICAL DATA:  Right upper quadrant abdominal pain. EXAM: ULTRASOUND ABDOMEN LIMITED RIGHT UPPER QUADRANT COMPARISON:  CT of the abdomen pelvis 01/08/2022 FINDINGS: Gallbladder: Gallbladder wall thickening measures up to 6 mm. Multiple shadowing stones are present in the neck of the gallbladder. No sonographic Percell Miller sign was reported. Common bile duct: Diameter: 4.0 mm, within normal limits Liver: Liver is diffusely echogenic. No discrete lesions are present. Portal vein is patent on color Doppler imaging with normal direction of blood flow towards the liver. Other: None. IMPRESSION: 1. Cholelithiasis with gallbladder wall thickening. Findings are equivocal for acute cholecystitis. 2. Fatty infiltration of the liver. Electronically Signed   By: San Morelle M.D.   On: 01/08/2022 12:04    Anti-infectives: Anti-infectives (From admission, onward)    Start     Dose/Rate Route Frequency Ordered Stop   01/08/22 2100  piperacillin-tazobactam (ZOSYN) IVPB 3.375 g        3.375 g 12.5 mL/hr over 240 Minutes Intravenous Every 8 hours 01/08/22 1527     01/08/22  1245  piperacillin-tazobactam (ZOSYN) IVPB 3.375 g        3.375 g 100 mL/hr over 30 Minutes Intravenous  Once 01/08/22 1241 01/08/22 1326        Assessment/Plan  Acute cholecystitis  - Patient has multiple medical issues including Metastatic renal cell carcinoma actively on chemotherapy (pembrolizumab and lenvatinib), increasing his risks with general anesthesia and abdominal surgery. - s/p IR perc chole 11/14 - WBC 11K today, cx pending, continue abx - ok to advance diet as tolerated - will arrange follow up with Dr. Ninfa Linden in several weeks  - recommend total course of 10-14 days abx - general surgery will sign off at this time, please call with questions or concerns    ID - zosyn VTE - ok for chemical dvt ppx after procedure from surgical standpoint FEN - IVF per TRH, CLD and ok to ADAT Foley - none   Metastatic renal cell carcinoma on chemotherapy (pembrolizumab and lenvatinib) HTN CKD Chronic pain - takes oxycontin and oxycodone AKI   LOS: 2 days   I reviewed Consultant IR notes, hospitalist notes, last 24 h vitals and pain scores, last 48 h intake  and output, last 24 h labs and trends, and last 24 h imaging results.    Norm Parcel, [facility] Surgery 01/10/2022, 10:30 AM Please see Amion for pager number during day hours 7:00am-4:30pm

## 2022-01-10 NOTE — Evaluation (Signed)
Physical Therapy Evaluation Patient Details Name: Phillip [name] MRN: [id number] DOB: [birth year] Today's Date: 01/10/2022  History of Present Illness  Patient is a 76 year old male who presented with confusion and abdominal pain. Patient was found to have acute cholecystitis, sepsis, AKI, and acute metabolic encephalopathy. S/P perc drain placement by IR 11/14. 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  Clinical Impression  On eval, pt required Max A to rise from low recliner (at baseline, pt uses a lift chair recliner). He walked ~75 feet with a RW. More pain with transitional movements vs ambulation. Pt and wife are agreeable to HHPT f/u. Will plan to follow pt during this hospital stay.        Recommendations for follow up therapy are one component of a multi-disciplinary discharge planning process, led by the attending physician.  Recommendations may be updated based on patient status, additional functional criteria and insurance authorization.  Follow Up Recommendations Home health PT      Assistance Recommended at Discharge Frequent or constant Supervision/Assistance  Patient can return home with the following  Assist for transportation;Help with stairs or ramp for entrance;Assistance with cooking/housework;A lot of help with bathing/dressing/bathroom;A lot of help with walking and/or transfers    Equipment Recommendations None recommended by PT  Recommendations for Other Services  OT consult    Functional Status Assessment Patient has had a recent decline in their functional status and demonstrates the ability to make significant improvements in function in a reasonable and predictable amount of time.     Precautions / Restrictions Precautions Precautions: Fall Precaution Comments: R side drain Restrictions Weight Bearing Restrictions: No      Mobility  Bed Mobility Overal bed mobility: Needs Assistance Bed Mobility: Sit to Supine       Sit to  supine: Mod assist, HOB elevated   General bed mobility comments: Assist for bil LEs and for trunk repositioning. Increased time. Effortful and painful for pt.    Transfers Overall transfer level: Needs assistance Equipment used: Rolling walker (2 wheels) Transfers: Sit to/from Stand Sit to Stand: Max assist           General transfer comment: Mod A to power up from low recliner (pt uses lift chair at baseline). Cues for safety, technique, hand placement.    Ambulation/Gait Ambulation/Gait assistance: Min guard Gait Distance (Feet): 75 Feet Assistive device: Rolling walker (2 wheels) Gait Pattern/deviations: Step-through pattern, Decreased stride length, Trunk flexed       General Gait Details: Slow but steady with RW use. Min guard for safety. Pt tolerated distance well. Denied dizziness.  Stairs            Wheelchair Mobility    Modified Rankin (Stroke Patients Only)       Balance Overall balance assessment: Needs assistance         Standing balance support: During functional activity, Bilateral upper extremity supported, Reliant on assistive device for balance Standing balance-Leahy Scale: Poor                               Pertinent Vitals/Pain Pain Assessment Pain Assessment: Faces Faces Pain Scale: Hurts little more Pain Location: R flank Pain Descriptors / Indicators: Grimacing, Guarding, Moaning Pain Intervention(s): Limited activity within patient's tolerance, Monitored during session, Repositioned    Home Living Family/patient expects to be discharged to:: Private residence Living Arrangements: Spouse/significant other Available Help at Discharge: Family;Available 24 hours/day  Type of Home: House Home Access: Ramped entrance       Home Layout: One level Home Equipment: [place] (2 wheels);Grab bars - toilet      Prior Function Prior Level of Function : Independent/Modified Independent               ADLs  Comments: uses a RW at home.     Hand Dominance   Dominant Hand: Right    Extremity/Trunk Assessment   Upper Extremity Assessment Upper Extremity Assessment: Defer to OT evaluation    Lower Extremity Assessment Lower Extremity Assessment: Generalized weakness    Cervical / Trunk Assessment Cervical / Trunk Assessment: Normal  Communication   Communication: No difficulties  Cognition Arousal/Alertness: Awake/alert Behavior During Therapy: WFL for tasks assessed/performed Overall Cognitive Status: Within Functional Limits for tasks assessed                                          General Comments      Exercises     Assessment/Plan    PT Assessment Patient needs continued PT services  PT Problem List Decreased strength;Decreased mobility;Decreased range of motion;Decreased activity tolerance;Decreased balance;Decreased knowledge of use of DME;Pain       PT Treatment Interventions DME instruction;Gait training;Therapeutic activities;Therapeutic exercise;Patient/family education;Balance training;Functional mobility training    PT Goals (Current goals can be found in the Care Plan section)  Acute Rehab PT Goals Patient Stated Goal: less pain PT Goal Formulation: With patient/family Time For Goal Achievement: 01/24/22 Potential to Achieve Goals: Good    Frequency Min 3X/week     Co-evaluation               AM-PAC PT "6 Clicks" Mobility  Outcome Measure Help needed turning from your back to your side while in a flat bed without using bedrails?: A Lot Help needed moving from lying on your back to sitting on the side of a flat bed without using bedrails?: A Lot Help needed moving to and from a bed to a chair (including a wheelchair)?: A Little Help needed standing up from a chair using your arms (e.g., wheelchair or bedside chair)?: A Lot Help needed to walk in hospital room?: A Little Help needed climbing 3-5 steps with a railing? :  Total 6 Click Score: 13    End of Session Equipment Utilized During Treatment: Gait belt Activity Tolerance: Patient tolerated treatment well;Patient limited by pain Patient left: in bed;with call bell/phone within reach;with family/visitor present;with bed alarm set   PT Visit Diagnosis: Muscle weakness (generalized) (M62.81);Difficulty in walking, not elsewhere classified (R26.2);Pain    Time: 2585-2778 PT Time Calculation (min) (ACUTE ONLY): 22 min   Charges:   PT Evaluation $PT Eval Low Complexity: Bells, PT Acute Rehabilitation  Office: [phone]

## 2022-01-10 NOTE — Progress Notes (Signed)
Referring Physician(s): Blackman,D  Supervising Physician: Ruthann Cancer  Patient Status:  [facility] - In-pt  Chief Complaint:  Abdominal pain/cholecystitis  Subjective: Pt doing much better since GB drain placed yesterday; more alert/talkative; has some RUQ discomfort but decreased since admission; denies fever/chills/N/V   Allergies: Contrast media [iodinated contrast media]  Medications: Prior to Admission medications   Medication Sig Start Date End Date Taking? Authorizing Provider  acetaminophen (TYLENOL) 650 MG CR tablet Take 650 mg by mouth every 8 (eight) hours as needed for pain.   Yes [provider]  Cholecalciferol (VITAMIN D) 50 MCG (2000 UT) CAPS Take 2,000 Units by mouth daily.   Yes [provider]  Cyanocobalamin (VITAMIN B12) 1000 MCG TBCR Take 1,000 mcg by mouth daily.   Yes [provider]  docusate sodium (COLACE) 100 MG capsule Take 200 mg by mouth 2 (two) times daily.   Yes [provider]  dorzolamide (TRUSOPT) 2 % ophthalmic solution Instill 1 drop into right eye three times daily. 10/27/21  Yes   famotidine (PEPCID) 20 MG tablet Take 20 mg by mouth 2 (two) times daily.   Yes [provider]  hydrocortisone (CORTEF) 10 MG tablet Take 2 tablets (20 mg total) by mouth in the morning AND 1 tablet (10 mg total) every evening. 12/29/21  Yes [name], MD  ibuprofen (ADVIL,MOTRIN) 200 MG tablet Take 200 mg by mouth every 8 (eight) hours as needed for fever, headache, mild pain, moderate pain or cramping.    Yes [provider]  lenvatinib 18 mg daily dose (LENVIMA, 18 MG DAILY DOSE,) 10 MG & 2 x 4 MG capsule Take 18 mg by mouth daily. 11/30/21  Yes [name], MD  levETIRAcetam (KEPPRA) 250 MG tablet Take 250 mg by mouth 2 (two) times daily.   Yes [provider]  lidocaine-prilocaine (EMLA) cream APPLY TO PORTACATH 1 HOUR PRIOR TO USE AS NEEDED 11/13/21  Yes [name], MD   losartan-hydrochlorothiazide (HYZAAR) 100-12.5 MG tablet Take 1 tablet by mouth once a day. 12/29/21  Yes   mirtazapine (REMERON) 30 MG tablet Take 1 tablet (30 mg total) by mouth at bedtime. 12/29/21  Yes [name], MD  Multiple Vitamin (MULTIVITAMIN ADULT PO) Take 1 tablet by mouth daily. [place] product   Yes [provider]  ondansetron (ZOFRAN) 8 MG tablet TAKE 1 TABLET BY MOUTH EVERY 8 HOURS AS NEEDED FOR NAUSEA AND VOMITING Patient taking differently: Take 8 mg by mouth every 8 (eight) hours as needed for nausea or vomiting. 11/03/21  Yes Owens Shark, NP  oxyCODONE (OXY IR/ROXICODONE) 5 MG immediate release tablet Take 1 tablet by mouth every 6 hours as needed for severe pain. 12/28/21  Yes Owens Shark, NP  oxyCODONE (OXYCONTIN) 10 mg 12 hr tablet TAKE 2 TABLETS BY MOUTH EVERY 12 HOURS. Patient taking differently: Take 20 mg by mouth every 12 (twelve) hours. 12/28/21  Yes Owens Shark, NP  polyethylene glycol (MIRALAX / GLYCOLAX) 17 g packet Take 17 g by mouth daily.   Yes [provider]  prochlorperazine (COMPAZINE) 10 MG tablet Take 1 tablet by mouth every 6 hours as needed for nausea or vomiting. 01/04/21  Yes Owens Shark, NP  Testosterone 1.62 % GEL Apply 2 pumps topically daily Patient taking differently: Apply 2 Pump topically 2 (two) times a week. 07/04/21  Yes Owens Shark, NP  COVID-19 mRNA vaccine [id number] (COMIRNATY) syringe Inject into the muscle. 12/27/21  Carlyle Basques, MD  predniSONE (DELTASONE) 50 MG tablet Take 1  tablet by mouth 13 hours, 7 hours, and 1 hour prior to CT scan. Patient not taking: Reported on 11/13/2021 11/06/21   [name], MD     Vital Signs: BP (!) 144/63 (BP Location: Left Arm)   Pulse 62   Temp 98 F (36.7 C) (Oral)   Resp 18   Ht _0  (1.803 m)   Wt 250 lb (113.4 kg)   SpO2 96%   BMI 34.87 kg/m   Physical Exam awake, answering questions appropriately; GB drain intact, insertion site ok, mildly  tender, OP 20 cc yellow fluid; drain flushed without difficulty  Imaging: IR Perc Cholecystostomy  Result Date: 01/09/2022 INDICATION: 76 year old male referred for percutaneous cholecystostomy EXAM: CHOLECYSTOSTOMY MEDICATIONS: None ANESTHESIA/SEDATION: Moderate (conscious) sedation was employed during this procedure. A total of Versed 0.5 mg and Fentanyl 100 mcg was administered intravenously. Moderate Sedation Time: 22 minutes. The patient's level of consciousness and vital signs were monitored continuously by radiology nursing throughout the procedure under my direct supervision. FLUOROSCOPY TIME:  Fluoroscopy Time: 1 minutes 6 seconds (19 mGy). COMPLICATIONS: None PROCEDURE: Informed written consent was obtained from the patient and the patient's family after a thorough discussion of the procedural risks, benefits and alternatives. All questions were addressed. Maximal Sterile Barrier Technique was utilized including caps, mask, sterile gowns, sterile gloves, sterile drape, hand hygiene and skin antiseptic. A timeout was performed prior to the initiation of the procedure. Ultrasound survey of the right upper quadrant was performed for planning purposes. Once the patient is prepped and draped in the usual sterile fashion, the skin and subcutaneous tissues overlying the gallbladder were generously infiltrated 1% lidocaine for local anesthesia. A coaxial needle was advanced under ultrasound guidance through the skin subcutaneous tissues and a small segment of liver into the gallbladder lumen. With removal of the stylet, spontaneous dark bile drainage occurred. Using modified Seldinger technique, a 10 French drain was placed into the gallbladder fossa, with aspiration of the sample for the lab. Contrast injection confirmed position of the tube within the gallbladder lumen. Drainage catheter was attached to gravity drain with a suture retention placed. Patient tolerated the procedure well and remained  hemodynamically stable throughout. No complications were encountered and no significant blood loss encountered. IMPRESSION: Status post percutaneous cholecystostomy Signed, Dulcy Fanny. Nadene Rubins, RPVI [facility] Radiology Electronically Signed   By: Corrie Mckusick D.O.   On: 01/09/2022 14:10   US Abdomen Limited RUQ (LIVER/GB)  Result Date: 01/08/2022 CLINICAL DATA:  Right upper quadrant abdominal pain. EXAM: ULTRASOUND ABDOMEN LIMITED RIGHT UPPER QUADRANT COMPARISON:  CT of the abdomen pelvis 01/08/2022 FINDINGS: Gallbladder: Gallbladder wall thickening measures up to 6 mm. Multiple shadowing stones are present in the neck of the gallbladder. No sonographic Percell Miller sign was reported. Common bile duct: Diameter: 4.0 mm, within normal limits Liver: Liver is diffusely echogenic. No discrete lesions are present. Portal vein is patent on color Doppler imaging with normal direction of blood flow towards the liver. Other: None. IMPRESSION: 1. Cholelithiasis with gallbladder wall thickening. Findings are equivocal for acute cholecystitis. 2. Fatty infiltration of the liver. Electronically Signed   By: San Morelle M.D.   On: 01/08/2022 12:04   CT Chest Wo Contrast  Result Date: 01/08/2022 CLINICAL DATA:  History of renal cell carcinoma currently on treatment increased weakness, abdominal pain, nausea and vomiting EXAM: CT CHEST, ABDOMEN AND PELVIS WITHOUT CONTRAST TECHNIQUE: Multidetector CT  imaging of the chest, abdomen and pelvis was performed following the standard protocol without IV contrast. RADIATION DOSE REDUCTION: This exam was performed according to the departmental dose-optimization program which includes automated exposure control, adjustment of the mA and/or kV according to patient size and/or use of iterative reconstruction technique. COMPARISON:  CT chest, abdomen, and pelvis dated 11/10/2021 FINDINGS: CT CHEST FINDINGS Cardiovascular:  Right chest wall port tip terminates in the lower SVC. Normal heart size. No significant pericardial fluid/thickening. Coronary artery calcifications. Great vessels are normal in course and caliber. Aortic atherosclerosis. Mediastinum/Nodes: thyroid gland without nodules meeting criteria for imaging follow-up by size. Normal esophagus. No pathologically enlarged axillary, supraclavicular, mediastinal, or hilar lymph nodes. Lungs/Pleura: There is mild respiratory motion artifact. The central airways are patent. No focal consolidation. Decreased conspicuity of previously noted right upper lobe nodules measuring average 4 mm (6:41) and 3 mm (6:65). No definite new or enlarging pulmonary nodules. No pneumothorax. No pleural effusion. Musculoskeletal: Similar multifocal osseous metastases, most notably mixed lytic and sclerotic appearance of T1 and T9 and similar expansile lytic lesion in right T11 costovertebral junction. Slightly increased sclerotic component of the left 4th rib (5:187). CT ABDOMEN PELVIS FINDINGS Hepatobiliary: Similar segment 4 cyst (2:53). Previously noted segment 3 cystic focus is not well seen. No new focal hepatic lesions. No intra or extrahepatic biliary ductal dilation. Distended gallbladder containing gallstones. Similar gallbladder fundal adenomyomatosis. New mild pericholecystic stranding. Pancreas: No pancreatic ductal dilatation or surrounding inflammatory changes. Spleen: Normal in size without focal abnormality. Adrenals/Urinary Tract: No adrenal nodules. No substantial change in size of treated left upper pole lesion measuring 2.2 x 2.0 cm (2:67). Unchanged subcentimeter exophytic focus along the anterior lower pole left kidney (2:81). No new or enlarging renal masses. No calculi or hydronephrosis. No focal bladder wall thickening. Stomach/Bowel: Normal appearance of the stomach. No evidence of bowel wall thickening, distention, or inflammatory changes. Normal appendix.  Vascular/Lymphatic: No significant vascular findings are present. No enlarged abdominal or pelvic lymph nodes. Reproductive: Enlarged prostate gland. Other: Increased small volume free fluid.  No fluid collection. Musculoskeletal: Multifocal osseous metastases with increased soft tissue component of a lytic left acetabular metastasis, for example measuring 3.9 x 2.4 cm inferiorly (2:126), previously 3.2 x 2.2 cm. Unchanged compression of L1 and L4. IMPRESSION: 1. Distended gallbladder containing gallstones with new mild pericholecystic stranding, which may reflect acute cholecystitis. Recommend correlation with right upper quadrant ultrasound for further evaluation. 2. Increased small volume free fluid in the abdomen and pelvis. 3. Multifocal osseous metastases with increased soft tissue component of a lytic left acetabular metastasis and increased sclerotic component of a left 4th rib metastasis. 4. Decreased conspicuity of previously noted right upper lobe pulmonary nodules. No definite new or enlarging pulmonary nodules. 5. No substantial change in size of treated left upper pole renal lesion. 6. Aortic atherosclerosis. Aortic Atherosclerosis (ICD10-I70.0). Electronically Signed   By: Darrin Nipper M.D.   On: 01/08/2022 11:04   CT ABDOMEN PELVIS WO CONTRAST  Result Date: 01/08/2022 CLINICAL DATA:  History of renal cell carcinoma currently on treatment increased weakness, abdominal pain, nausea and vomiting EXAM: CT CHEST, ABDOMEN AND PELVIS WITHOUT CONTRAST TECHNIQUE: Multidetector CT imaging of the chest, abdomen and pelvis was performed following the standard protocol without IV contrast. RADIATION DOSE REDUCTION: This exam was performed according to the departmental dose-optimization program which includes automated exposure control, adjustment of the mA and/or kV according to patient size and/or use of iterative reconstruction technique. COMPARISON:  CT chest, abdomen, and  pelvis dated 11/10/2021 FINDINGS:  CT CHEST FINDINGS Cardiovascular: Right chest wall port tip terminates in the lower SVC. Normal heart size. No significant pericardial fluid/thickening. Coronary artery calcifications. Great vessels are normal in course and caliber. Aortic atherosclerosis. Mediastinum/Nodes: thyroid gland without nodules meeting criteria for imaging follow-up by size. Normal esophagus. No pathologically enlarged axillary, supraclavicular, mediastinal, or hilar lymph nodes. Lungs/Pleura: There is mild respiratory motion artifact. The central airways are patent. No focal consolidation. Decreased conspicuity of previously noted right upper lobe nodules measuring average 4 mm (6:41) and 3 mm (6:65). No definite new or enlarging pulmonary nodules. No pneumothorax. No pleural effusion. Musculoskeletal: Similar multifocal osseous metastases, most notably mixed lytic and sclerotic appearance of T1 and T9 and similar expansile lytic lesion in right T11 costovertebral junction. Slightly increased sclerotic component of the left 4th rib (5:187). CT ABDOMEN PELVIS FINDINGS Hepatobiliary: Similar segment 4 cyst (2:53). Previously noted segment 3 cystic focus is not well seen. No new focal hepatic lesions. No intra or extrahepatic biliary ductal dilation. Distended gallbladder containing gallstones. Similar gallbladder fundal adenomyomatosis. New mild pericholecystic stranding. Pancreas: No pancreatic ductal dilatation or surrounding inflammatory changes. Spleen: Normal in size without focal abnormality. Adrenals/Urinary Tract: No adrenal nodules. No substantial change in size of treated left upper pole lesion measuring 2.2 x 2.0 cm (2:67). Unchanged subcentimeter exophytic focus along the anterior lower pole left kidney (2:81). No new or enlarging renal masses. No calculi or hydronephrosis. No focal bladder wall thickening. Stomach/Bowel: Normal appearance of the stomach. No evidence of bowel wall thickening, distention, or inflammatory changes.  Normal appendix. Vascular/Lymphatic: No significant vascular findings are present. No enlarged abdominal or pelvic lymph nodes. Reproductive: Enlarged prostate gland. Other: Increased small volume free fluid.  No fluid collection. Musculoskeletal: Multifocal osseous metastases with increased soft tissue component of a lytic left acetabular metastasis, for example measuring 3.9 x 2.4 cm inferiorly (2:126), previously 3.2 x 2.2 cm. Unchanged compression of L1 and L4. IMPRESSION: 1. Distended gallbladder containing gallstones with new mild pericholecystic stranding, which may reflect acute cholecystitis. Recommend correlation with right upper quadrant ultrasound for further evaluation. 2. Increased small volume free fluid in the abdomen and pelvis. 3. Multifocal osseous metastases with increased soft tissue component of a lytic left acetabular metastasis and increased sclerotic component of a left 4th rib metastasis. 4. Decreased conspicuity of previously noted right upper lobe pulmonary nodules. No definite new or enlarging pulmonary nodules. 5. No substantial change in size of treated left upper pole renal lesion. 6. Aortic atherosclerosis. Aortic Atherosclerosis (ICD10-I70.0). Electronically Signed   By: Darrin Nipper M.D.   On: 01/08/2022 11:04   CT Head Wo Contrast  Result Date: 01/08/2022 CLINICAL DATA:  Worsening weakness.  Known brain metastases. EXAM: CT HEAD WITHOUT CONTRAST TECHNIQUE: Contiguous axial images were obtained from the base of the skull through the vertex without intravenous contrast. RADIATION DOSE REDUCTION: This exam was performed according to the departmental dose-optimization program which includes automated exposure control, adjustment of the mA and/or kV according to patient size and/or use of iterative reconstruction technique. COMPARISON:  Head MRI 01/02/2022 FINDINGS: Brain: There is no evidence of an acute infarct, acute intracranial hemorrhage, midline shift, or extra-axial fluid  collection. Two known small metastases in the medial left frontal lobe are partially calcified. Mild-to-moderate left frontal lobe edema is similar to the prior MRI. Smaller areas of edema or gliosis in the left temporal and left occipital lobes are unchanged. 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  There is a background of mild chronic small vessel ischemia elsewhere in the cerebral white matter. Vascular: Calcified atherosclerosis at the skull base. No hyperdense vessel. Skull: No fracture or suspicious osseous lesion. Sinuses/Orbits: Mild bilateral ethmoid air cell mucosal thickening. Clear mastoid air cells. Unremarkable orbits. Other: None. IMPRESSION: Known brain metastases without evidence of worsening edema, acute hemorrhage, or other acute intracranial abnormality. Electronically Signed   By: Logan Bores M.D.   On: 01/08/2022 10:36   DG Chest Port 1 View  Result Date: 01/08/2022 CLINICAL DATA:  76 year old male with history of hypoxia and weakness. History of renal cell carcinoma. EXAM: PORTABLE CHEST 1 VIEW COMPARISON:  No prior chest x-ray.  Chest CT 11/10/2021. FINDINGS: Right-sided internal jugular single-lumen Port-A-Cath with tip terminating at the superior cavoatrial junction. Lung volumes are normal. No consolidative airspace disease. No pleural effusions. No pneumothorax. No pulmonary nodule or mass noted. Pulmonary vasculature and the cardiomediastinal silhouette are within normal limits. Atherosclerotic calcifications in the thoracic aorta. IMPRESSION: 1. No radiographic evidence of acute cardiopulmonary disease. No definite signs of metastatic disease in the thorax. Electronically Signed   By: Vinnie Langton M.D.   On: 01/08/2022 09:38    Labs:  CBC: Recent Labs    12/27/21 1017 01/08/22 1210 01/09/22 0333 01/10/22 0307  WBC 7.3 16.5* 15.9* 11.0*  HGB 10.3* 13.2 10.5* 10.1*  HCT 33.4* 42.6 34.6* 33.0*  PLT 327 357 266 285    COAGS: Recent  Labs    01/08/22 1522 01/09/22 0333  INR 1.3* 1.3*    BMP: Recent Labs    12/27/21 1017 01/08/22 1210 01/09/22 0333 01/10/22 0307  NA 140 137 134* 137  K 3.8 4.5 3.5 3.6  CL 101 96* 96* 100  CO2 _0 GLUCOSE 188* 156* 164* 199*  BUN 17 18 26* 31*  CALCIUM 9.2 8.4* 8.1* 7.7*  CREATININE 0.82 1.50* 2.81* 1.92*  GFRNONAA >60 48* 22* 35*    LIVER FUNCTION TESTS: Recent Labs    12/27/21 1017 01/08/22 1210 01/09/22 0333 01/10/22 0307  BILITOT 0.2* 1.6* 0.8 0.4  AST 10* _1 ALT _2 ALKPHOS 83 79 82 56  PROT 6.6 6.9 5.9* 5.6*  ALBUMIN 3.7 2.9* 2.4* 2.2*    Assessment and Plan: Pt with hx acute calculous cholecystitis, met RCC; s/p perc GB drain 11/14 (10 fr to bag); afebrile; WBC 11(15.9), hgb stable, creat 1.92(2.81), blood cx neg to date, bile cx neg to date; cont drain for at least 4-6 weeks unless GB removal done in interim; cont drain flushes tid as IP; monitor labs/cx; hydrate; antbx per primary team; f/u cholangiogram in 6 weeks or sooner if problems arise   Electronically Signed: D. Rowe Robert, PA-C 01/10/2022, 9:14 AM   I spent a total of 15 Minutes at the the patient's bedside AND on the patient's hospital floor or unit, greater than 50% of which was counseling/coordinating care for gallbladder drain    Patient ID: Phillip [name], male   DOB: 1945-07-24, 76 y.o.   MRN: [id number]

## 2022-01-10 NOTE — Progress Notes (Signed)
I called and spoke with the patient's partner [name] and discussed the MRI results. She was aware of these results and she and Mr. Turman discussed this but with his recent infection in the gallbladder this week, she is not sure he remembers all of their discussion. She would prefer to revisit this with him herself, and we discussed the conversation from brain oncology conference on Monday and that Dr. [name] would like to offer a single fraction of SRS to the new lesion in the right parietal lobe. If he's not ready to simulate tomorrow or Friday, we'd need to delay treating him until after Thanksgiving, which is reasonable, but she is aware that he would need an updated MRI scan in order to proceed since we like to treat patients within 2 weeks of their MRI scans. She is in agreement. I've reached out to our brain oncology navigator as well and she will touch base with the patient and Ms. Wise on Friday to see how they'd like to move forward.     [name], PAC

## 2022-01-10 NOTE — Evaluation (Signed)
Occupational Therapy Evaluation Patient Details Name: Phillip [name] MRN: [id number] DOB: 06/10/1945 Today's Date: 01/10/2022   History of Present Illness Patient is a 76 year old male who presented with confusion and abdominal pain. Patient was found to have acute cholecystitis, sepsis, AKI, and acute metabolic encephalopathy. 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   Clinical Impression   Patient is a 76 year old male who was admitted for above. Patient was living at home at [facility] level with wife prior level.   Patient currently is max A for bed mobility, and increased A for transfers sit to stand with RW, max A for LB Dressing/bathing tasks with education provided on long handled sponge and long handled shoe horn. Patient was noted to have decreased functional activity tolerance, decreased endurance, decreased standing balance, decreased safety awareness, and decreased knowledge of AD/AE impacting participation in ADLs. Patient would continue to benefit from skilled OT services at this time while admitted and after d/c to address noted deficits in order to improve overall safety and independence in ADLs.     Recommendations for follow up therapy are one component of a multi-disciplinary discharge planning process, led by the attending physician.  Recommendations may be updated based on patient status, additional functional criteria and insurance authorization.   Follow Up Recommendations  Home health OT     Assistance Recommended at Discharge Frequent or constant Supervision/Assistance  Patient can return home with the following A little help with walking and/or transfers;A lot of help with bathing/dressing/bathroom;Assistance with cooking/housework;Direct supervision/assist for medications management;Direct supervision/assist for financial management;Help with stairs or ramp for entrance;Assist for transportation    Functional Status Assessment  Patient has had a  recent decline in their functional status and demonstrates the ability to make significant improvements in function in a reasonable and predictable amount of time.  Equipment Recommendations  BSC/3in1    Recommendations for Other Services       Precautions / Restrictions Precautions Precaution Comments: R side drain Restrictions Weight Bearing Restrictions: No      Mobility Bed Mobility Overal bed mobility: Needs Assistance Bed Mobility: Supine to Sit     Supine to sit: Max assist     General bed mobility comments: patient was max A for supine to sit on edge of bed with education on proper sequencing for transition to midline on EOB. pain and fear of moving impacted assist level    Transfers                          Balance Overall balance assessment: Needs assistance Sitting-balance support: No upper extremity supported, Feet supported Sitting balance-Leahy Scale: Fair     Standing balance support: During functional activity, Bilateral upper extremity supported, Reliant on assistive device for balance Standing balance-Leahy Scale: Poor                             ADL either performed or assessed with clinical judgement   ADL Overall ADL's : Needs assistance/impaired Eating/Feeding: Supervision/ safety;Sitting   Grooming: Supervision/safety;Sitting   Upper Body Bathing: Set up;Sitting   Lower Body Bathing: Maximal assistance;Sitting/lateral leans;Bed level   Upper Body Dressing : Minimal assistance;Sitting   Lower Body Dressing: Maximal assistance;Bed level;Sitting/lateral leans   Toilet Transfer: +2 for safety/equipment;Moderate assistance Toilet Transfer Details (indicate cue type and reason): patient was mod A x2 with HOB elevated for sit to stand. patient uses  lift chair at home per wife report. patient was able to clear feet and turn with min A 1 and second for safety. patient reported feeling better about movement after being able to  get out of bed. Toileting- Clothing Manipulation and Hygiene: Total assistance;Sit to/from stand       Functional mobility during ADLs: Minimal assistance;+2 for physical assistance;+2 for safety/equipment       Vision Patient Visual Report: No change from baseline       Perception     Praxis      Pertinent Vitals/Pain Pain Assessment Pain Assessment: Faces Faces Pain Scale: Hurts little more Pain Location: R side drain Pain Descriptors / Indicators: Grimacing, Guarding Pain Intervention(s): Limited activity within patient's tolerance, Monitored during session, Repositioned     Hand Dominance Right   Extremity/Trunk Assessment Upper Extremity Assessment Upper Extremity Assessment: Overall WFL for tasks assessed   Lower Extremity Assessment Lower Extremity Assessment: Defer to PT evaluation   Cervical / Trunk Assessment Cervical / Trunk Assessment: Normal   Communication Communication Communication: No difficulties   Cognition Arousal/Alertness: Awake/alert Behavior During Therapy: WFL for tasks assessed/performed Overall Cognitive Status: Within Functional Limits for tasks assessed                                 General Comments: patients significant other was present in room as well. patient was plesant and cooperative. nervous about moving     General Comments       Exercises     Shoulder Instructions      Home Living Family/patient expects to be discharged to:: Private residence Living Arrangements: Spouse/significant other Available Help at Discharge: Family;Available 24 hours/day Type of Home: House Home Access: Ramped entrance     Home Layout: One level     Bathroom Shower/Tub: Walk-in shower         Home Equipment: Conservation officer, nature (2 wheels);Grab bars - toilet          Prior Functioning/Environment Prior Level of Function : Independent/Modified Independent               ADLs Comments: uses a RW at home.         OT Problem List: Decreased activity tolerance;Decreased safety awareness;Decreased knowledge of precautions;Decreased knowledge of use of DME or AE;Pain;Impaired balance (sitting and/or standing)      OT Treatment/Interventions: Self-care/ADL training;Energy conservation;Balance training;Therapeutic exercise;DME and/or AE instruction;Therapeutic activities;Patient/family education    OT Goals(Current goals can be found in the care plan section) Acute Rehab OT Goals Patient Stated Goal: to get better OT Goal Formulation: With patient/family Time For Goal Achievement: 01/24/22 Potential to Achieve Goals: Fair  OT Frequency: Min 2X/week    Co-evaluation              AM-PAC OT "6 Clicks" Daily Activity     Outcome Measure Help from another person eating meals?: None Help from another person taking care of personal grooming?: A Little Help from another person toileting, which includes using toliet, bedpan, or urinal?: A Lot Help from another person bathing (including washing, rinsing, drying)?: A Lot Help from another person to put on and taking off regular upper body clothing?: A Little Help from another person to put on and taking off regular lower body clothing?: A Lot 6 Click Score: 16   End of Session Equipment Utilized During Treatment: Rolling walker (2 wheels) Nurse Communication: Other (comment) (ok to participate in session)  Activity Tolerance: Patient tolerated treatment well Patient left: in chair;with call bell/phone within reach;with family/visitor present  OT Visit Diagnosis: Unsteadiness on feet (R26.81);Other abnormalities of gait and mobility (R26.89);Pain Pain - Right/Left: Right                Time: 6720-9198 OT Time Calculation (min): 40 min Charges:  OT General Charges $OT Visit: 1 Visit OT Evaluation $OT Eval Moderate Complexity: 1 Mod OT Treatments $Self Care/Home Management : 23-37 mins  Rennie Plowman, MS Acute Rehabilitation Department Office#  [phone]   Feliz Beam Tamer Baughman 01/10/2022, 3:09 PM

## 2022-01-10 NOTE — Progress Notes (Signed)
PROGRESS NOTE    Phillip [name]  XKG:[id number] DOB: 12-30-1945 DOA: 01/08/2022  PCP: [name], MD   Brief Narrative:  This 76 years old male with PMH significant for renal cell carcinoma with metastasis to the brain, seizures, hypertension presented in the ED with complaints of abdominal pain and confusion.  History was obtained from patient's wife at bedside,  states patient had epigastric abdominal pain after eating some barbecue 2 days ago. He was nauseous but did not vomit.  There were no fevers noted.  Patient had similar episodes in the past and thought it was gas so he was given some Gas-X however symptoms did not improve.  He has not eaten in last two days,  he seems to become more confused as the day progressed so he was brought in the ED.  Patient was found to have acute cholecystitis, general surgery was consulted recommended IR consult for percutaneous cholecystostomy tube.  Assessment & Plan:   Principal Problem:   Acute cholecystitis Active Problems:   Lumbar spine metastasis   Renal cell carcinoma ([name])   HTN (hypertension)   Metastasis to brain [facility])   Seizure disorder ([name])   Sepsis ([place])   AKI (acute kidney injury) ([place])   Acute metabolic encephalopathy  Sepsis secondary to acute cholecystitis: Patient presented with tachypnea, leukocytosis, lactic acid 2.0, procalcitonin 3.81, AKI. RUQ ultrasound shows findings consistent with acute cholecystitis. Empirically started on IV Zosyn and IV hydration. Lactic acid improved with IV hydration. Blood and urine cultures no growth so far. General Surgery consulted , since patient has metastatic renal cell carcinoma actively on chemotherapy, recommended IR percutaneous drain. Patient successfully underwent percutaneous cholecystostomy drain 11/14. Plan: continue drain for at least 4 to 6 weeks unless gallbladder removal in the interim. He remains afebrile, leukocytosis improving.  Sepsis physiology  improving. Cultures are not growing anything.  Can be discharged on Augmentin.  Acute metabolic encephalopathy likely secondary to acute infection No focal neurological deficit noted on exam.  Patient with generalized weakness. He does not appear ictal.  Continue home seizure medications. Continue Keppra 250 mg twice daily. Mental status seems at his baseline.  Acute kidney injury: Likely prerenal.  Baseline serum creatinine normal. Presented with serum creatinine 1.5 trended up to 2.81 Continue IV fluid resuscitation.  Avoid nephrotoxic medication. No obstruction noted on imaging.  Serum creatinine improving1.92  Renal cell carcinoma with mets to the brain and bones.: Continue follow-up with radiation oncology/heme-onc.  Outpatient  Essential hypertension: Holding blood pressure medications due to AKI. Continue hydralazine as needed.  Seizure disorder: Continue Keppra for 250 mg every 12 hours.  DVT prophylaxis:Heparin Code Status: DNR Family Communication: No family at bedside Disposition Plan:   Status is: Inpatient Remains inpatient appropriate because: Admitted for acute cholecystitis.  General surgery and IR is consulted, patient underwent successful percutaneous cholecystostomy tube tolerated well.    Patient is medically clear, anticipated discharge home 01/11/2022  Consultants:  General surgery IR  Procedures: Percutaneous cholecystostomy tube Antimicrobials:  Anti-infectives (From admission, onward)    Start     Dose/Rate Route Frequency Ordered Stop   01/08/22 2100  piperacillin-tazobactam (ZOSYN) IVPB 3.375 g        3.375 g 12.5 mL/hr over 240 Minutes Intravenous Every 8 hours 01/08/22 1527     01/08/22 1245  piperacillin-tazobactam (ZOSYN) IVPB 3.375 g        3.375 g 100 mL/hr over 30 Minutes Intravenous  Once 01/08/22 1241 01/08/22 1326  Subjective: Patient was seen and examined at bedside.  Overnight events noted.   Patient seems at his  baseline mental status.  S/p cholecystostomy tube11/14. Patient reports slight tenderness around the PCT area.     Objective: Vitals:   01/09/22 2021 01/10/22 0002 01/10/22 0338 01/10/22 1232  BP: 113/69 (!) 122/58 (!) 144/63 (!) 150/72  Pulse: 71 64 62 75  Resp: '20 18 18 16  '$ Temp: 99.7 F (37.6 C) 99 F (37.2 C) 98 F (36.7 C) 98.2 F (36.8 C)  TempSrc: Oral Oral Oral Oral  SpO2: 94% 95% 96% 93%  Weight:      Height:        Intake/Output Summary (Last 24 hours) at 01/10/2022 1303 Last data filed at 01/10/2022 1125 Gross per 24 hour  Intake 4738.57 ml  Output 1621 ml  Net 3117.57 ml   Filed Weights   01/08/22 0924  Weight: 113.4 kg   Examination: General exam: Appears comfortable, not in any acute distress, deconditioned. Respiratory system: CTA bilaterally, respiratory effort normal, RR 15. Cardiovascular system: S1-S2 heard, regular rate and rhythm, no murmur. Gastrointestinal system: Abdomen is soft, mildly tender, non distended, BS+, PCT noted. Central nervous system: Alert and oriented x 3. No focal neurological deficits. Extremities: No edema, no cyanosis, no clubbing. Skin: No rashes, lesions or ulcers Psychiatry: Judgement and insight appear normal. Mood & affect appropriate.     Data Reviewed: I have personally reviewed following labs and imaging studies  CBC: Recent Labs  Lab 01/08/22 1210 01/09/22 0333 01/10/22 0307  WBC 16.5* 15.9* 11.0*  NEUTROABS 10.2*  --   --   HGB 13.2 10.5* 10.1*  HCT 42.6 34.6* 33.0*  MCV 84.5 85.4 84.6  PLT 357 266 768   Basic Metabolic Panel: Recent Labs  Lab 01/08/22 1210 01/09/22 0333 01/10/22 0307  NA 137 134* 137  K 4.5 3.5 3.6  CL 96* 96* 100  CO2 '25 25 27  '$ GLUCOSE 156* 164* 199*  BUN 18 26* 31*  CREATININE 1.50* 2.81* 1.92*  CALCIUM 8.4* 8.1* 7.7*  MG  --   --  1.7  PHOS  --   --  2.2*   GFR: Estimated Creatinine Clearance: 41.2 mL/min (A) (by C-G formula based on SCr of 1.92 mg/dL (H)). Liver  Function Tests: Recent Labs  Lab 01/08/22 1210 01/09/22 0333 01/10/22 0307  AST '29 16 20  '$ ALT '13 12 14  '$ ALKPHOS 79 82 56  BILITOT 1.6* 0.8 0.4  PROT 6.9 5.9* 5.6*  ALBUMIN 2.9* 2.4* 2.2*   Recent Labs  Lab 01/08/22 1210  LIPASE 22   No results for input(s): "AMMONIA" in the last 168 hours. Coagulation Profile: Recent Labs  Lab 01/08/22 1522 01/09/22 0333  INR 1.3* 1.3*   Cardiac Enzymes: No results for input(s): "CKTOTAL", "CKMB", "CKMBINDEX", "TROPONINI" in the last 168 hours. BNP (last 3 results) No results for input(s): "PROBNP" in the last 8760 hours. HbA1C: No results for input(s): "HGBA1C" in the last 72 hours. CBG: Recent Labs  Lab 01/08/22 0922  GLUCAP 167*   Lipid Profile: No results for input(s): "CHOL", "HDL", "LDLCALC", "TRIG", "CHOLHDL", "LDLDIRECT" in the last 72 hours. Thyroid Function Tests: No results for input(s): "TSH", "T4TOTAL", "FREET4", "T3FREE", "THYROIDAB" in the last 72 hours. Anemia Panel: No results for input(s): "VITAMINB12", "FOLATE", "FERRITIN", "TIBC", "IRON", "RETICCTPCT" in the last 72 hours. Sepsis Labs: Recent Labs  Lab 01/08/22 0931 01/08/22 1522 01/09/22 0333  PROCALCITON  --   --  3.81  LATICACIDVEN 2.3*  2.0*  --     Recent Results (from the past 240 hour(s))  Blood culture (routine x 2)     Status: None (Preliminary result)   Collection Time: 01/08/22  5:08 PM   Specimen: BLOOD LEFT ARM  Result Value Ref Range Status   Specimen Description BLOOD LEFT ARM  Final   Special Requests   Final    BOTTLES DRAWN AEROBIC AND ANAEROBIC Blood Culture adequate volume   Culture   Final    NO GROWTH 2 DAYS Performed at [facility], [address], [place], [place] [address]    Report Status PENDING  Incomplete  Blood culture (routine x 2)     Status: None (Preliminary result)   Collection Time: 01/08/22  5:14 PM   Specimen: BLOOD  Result Value Ref Range Status   Specimen Description   Final    BLOOD SITE NOT  SPECIFIED Performed at [facility] [address], [place], [place] [address]    Special Requests   Final    BOTTLES DRAWN AEROBIC AND ANAEROBIC Blood Culture adequate volume Performed at [facility] [address], [place], [place] [address]    Culture   Final    NO GROWTH 2 DAYS Performed at [facility] [address] [place], [place] [address]    Report Status PENDING  Incomplete  Aerobic/Anaerobic Culture w Gram Stain (surgical/deep wound)     Status: None (Preliminary result)   Collection Time: 01/09/22  1:58 PM   Specimen: Gallbladder; Bile  Result Value Ref Range Status   Specimen Description   Final    GALL BLADDER Performed at [facility] [address] [place], [place] [address]    Special Requests   Final    NONE Performed at [facility], [place] [address] [place], [place] [address]    Gram Stain   Final    FEW WBC PRESENT,BOTH PMN AND MONONUCLEAR NO ORGANISMS SEEN    Culture   Final    NO GROWTH < 24 HOURS Performed at [facility], [place] [address] [place], [place] [address]    Report Status PENDING  Incomplete    Radiology Studies: IR Perc Cholecystostomy  Result Date: 01/09/2022 INDICATION: 76 year old male referred for percutaneous cholecystostomy EXAM: CHOLECYSTOSTOMY MEDICATIONS: None ANESTHESIA/SEDATION: Moderate (conscious) sedation was employed during this procedure. A total of Versed 0.5 mg and Fentanyl 100 mcg was administered intravenously. Moderate Sedation Time: 22 minutes. The patient's level of consciousness and vital signs were monitored continuously by radiology nursing throughout the procedure under my direct supervision. FLUOROSCOPY TIME:  Fluoroscopy Time: 1 minutes 6 seconds (19 mGy). COMPLICATIONS: None PROCEDURE: Informed written consent was obtained from the patient and the patient's family after a thorough discussion of the procedural risks,  benefits and alternatives. All questions were addressed. Maximal Sterile Barrier Technique was utilized including caps, mask, sterile gowns, sterile gloves, sterile drape, hand hygiene and skin antiseptic. A timeout was performed prior to the initiation of the procedure. Ultrasound survey of the right upper quadrant was performed for planning purposes. Once the patient is prepped and draped in the usual sterile fashion, the skin and subcutaneous tissues overlying the gallbladder were generously infiltrated 1% lidocaine for local anesthesia. A coaxial needle was advanced under ultrasound guidance through the skin subcutaneous tissues and a small segment of liver into the gallbladder lumen. With removal of the stylet, spontaneous dark bile drainage occurred. Using modified Seldinger technique,  a 42 French drain was placed into the gallbladder fossa, with aspiration of the sample for the lab. Contrast injection confirmed position of the tube within the gallbladder lumen. Drainage catheter was attached to gravity drain with a suture retention placed. Patient tolerated the procedure well and remained hemodynamically stable throughout. No complications were encountered and no significant blood loss encountered. IMPRESSION: Status post percutaneous cholecystostomy Signed, Dulcy Fanny. Nadene Rubins, RPVI [facility] Electronically Signed   By: Corrie Mckusick D.O.   On: 01/09/2022 14:10    Scheduled Meds:  calcium carbonate  400 mg of elemental calcium Oral TID   Chlorhexidine Gluconate Cloth  6 each Topical Daily   dorzolamide  1 drop Right Eye TID   hydrocortisone sod succinate (SOLU-CORTEF) inj  30 mg Intravenous QAC breakfast   sodium chloride flush  10-40 mL Intracatheter Q12H   sodium chloride flush  5 mL Intracatheter Q8H   Continuous Infusions:  lactated ringers 125 mL/hr at 01/10/22 1230   levETIRAcetam 250 mg (01/10/22 0913)    piperacillin-tazobactam (ZOSYN)  IV 3.375 g (01/10/22 0616)     LOS: 2 days    Time spent: 35 mins    Samia Kukla, MD [facility]   If 7PM-7AM, please contact night-coverage

## 2022-01-11 DIAGNOSIS — K810 Acute cholecystitis: Secondary | ICD-10-CM | POA: Diagnosis not present

## 2022-01-11 MED ORDER — OXYCODONE HCL ER 20 MG PO T12A
20.0000 mg | EXTENDED_RELEASE_TABLET | Freq: Two times a day (BID) | ORAL | Status: DC
  Administered 2022-01-11 – 2022-01-12 (×3): 20 mg via ORAL
  Filled 2022-01-11 (×3): qty 1

## 2022-01-11 MED ORDER — LEVETIRACETAM 250 MG PO TABS
250.0000 mg | ORAL_TABLET | Freq: Two times a day (BID) | ORAL | Status: DC
  Administered 2022-01-11 – 2022-01-12 (×2): 250 mg via ORAL
  Filled 2022-01-11 (×2): qty 1

## 2022-01-11 MED ORDER — OXYCODONE HCL 5 MG PO TABS
5.0000 mg | ORAL_TABLET | Freq: Four times a day (QID) | ORAL | Status: DC | PRN
  Administered 2022-01-11 – 2022-01-12 (×2): 5 mg via ORAL
  Filled 2022-01-11 (×2): qty 1

## 2022-01-11 NOTE — TOC Progression Note (Addendum)
Transition of Care [facility]) - Progression Note    Patient Details  Name: Phillip [name] MRN: [id number] Date of Birth: 02-Sep-1945  Transition of Care [facility]) CM/SW Contact  Letanya Froh, Juliann Pulse, RN Phone Number: 01/11/2022, 10:56 AM  Clinical Narrative: Recc HHPT/OT-left vm w/[name](spouse) await call back.   -3:26p-[employer] rep Cory aware of HPT/OT.    Expected Discharge Plan: [place] Barriers to Discharge: Continued Medical Work up  Expected Discharge Plan and Services Expected Discharge Plan: [place] Determinants of Health (SDOH) Interventions    Readmission Risk Interventions    01/09/2022    1:14 PM  Readmission Risk Prevention Plan  Transportation Screening Complete  PCP or Specialist Appt within 3-5 Days Complete  Social Work Consult for [place] Planning/Counseling Complete  Palliative Care Screening Complete  Medication Review Press photographer) Complete

## 2022-01-11 NOTE — Plan of Care (Signed)

## 2022-01-11 NOTE — Consult Note (Addendum)
Cardiology Consultation   Patient ID: Phillip [name] MRN: [id number]; DOB: 1946-01-20  Admit date: 01/08/2022 Date of Consult: 01/11/2022  PCP:  [name], [facility] Cardiologist:  Werner Lean, MD  new  Patient Profile:   Phillip [name] is a 76 y.o. male with a hx of renal cell CA w/ mets brain, Sz, HTN, who is being seen 01/11/2022 for the evaluation of sinus pause at the request of Dr Pietro Cassis.  History of Present Illness:   Phillip [name] has not seen Cards.   He was admitted 11/13 with sepsis 2nd cholecystitis, s/p perc drain, on IV ABX, acute metabolic encephalopathy 2nd acute infection, AKI.  Today, he had a 4.09 sec pause, Cards asked to see.   Phillip [name] has been in sinus rhythm consistently on his telemetry.  Since he has been on IV antibiotics and has had the drain placed, his heart rate at baseline and amount of ectopy has decreased.  However, he has not had any pauses until today.  The 4-second pause occurred in the setting of sinus rhythm, no P waves visible, and resolved spontaneously.  He was completely asymptomatic with this and his baseline heart rate stayed in the 80s, with no significant change.  He has no history of presyncope or syncope and has not been on any rate lowering medications.  He has been on chemotherapy for the cancer, with PEMBROLIZUMAB or Keytruda, and lenvatinib. At last office visit 11/01, he was considered stable, to continue rx and f/u in 3 weeks.     Past Medical History:  Diagnosis Date   Anxiety    Brain cancer ([place])    Chronic kidney disease    renal cancer   Constipation    Depression    Hypertension    Low testosterone in male    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   Seizures ([place])    last seizure 01/2019 - controlled since on keppra   Wears glasses     Past Surgical History:  Procedure Laterality Date   insertion port-a-cath  2018   IR GENERIC HISTORICAL   11/18/2015   IR FLUORO GUIDED NEEDLE PLC ASPIRATION/INJECTION LOC 11/18/2015 [name], MD [facility]   IR GENERIC HISTORICAL  05/16/2016   IR FLUORO GUIDE PORT INSERTION RIGHT 05/16/2016 [name], MD [facility]   IR GENERIC HISTORICAL  05/16/2016   IR US GUIDE VASC ACCESS RIGHT 05/16/2016 [name], MD [facility]   IR PERC CHOLECYSTOSTOMY  01/09/2022   RADIOLOGY WITH ANESTHESIA N/A 06/16/2019   Procedure: MRI BRAIN WITH AND WITHOUT CONTRAST WITH ANESTHESIA;  Surgeon: Radiologist, Medication, MD;  Location: [facility];  Service: Radiology;  Laterality: N/A;     Home Medications:  Prior to Admission medications   Medication Sig Start Date End Date Taking? Authorizing Provider  acetaminophen (TYLENOL) 650 MG CR tablet Take 650 mg by mouth every 8 (eight) hours as needed for pain.   Yes [provider]  Cholecalciferol (VITAMIN D) 50 MCG (2000 UT) CAPS Take 2,000 Units by mouth daily.   Yes [provider]  Cyanocobalamin (VITAMIN B12) 1000 MCG TBCR Take 1,000 mcg by mouth daily.   Yes [provider]  docusate sodium (COLACE) 100 MG capsule Take 200 mg by mouth 2 (two) times daily.   Yes [provider]  dorzolamide (TRUSOPT) 2 % ophthalmic solution Instill 1 drop into right eye three times daily. 10/27/21  Yes  famotidine (PEPCID) 20 MG tablet Take 20 mg by mouth 2 (two) times daily.   Yes [provider]  hydrocortisone (CORTEF) 10 MG tablet Take 2 tablets (20 mg total) by mouth in the morning AND 1 tablet (10 mg total) every evening. 12/29/21  Yes [name], MD  ibuprofen (ADVIL,MOTRIN) 200 MG tablet Take 200 mg by mouth every 8 (eight) hours as needed for fever, headache, mild pain, moderate pain or cramping.    Yes [provider]  lenvatinib 18 mg daily dose (LENVIMA, 18 MG DAILY DOSE,) 10 MG & 2 x 4 MG capsule Take 18 mg by mouth daily. 11/30/21  Yes [name], MD  levETIRAcetam (KEPPRA) 250 MG tablet  Take 250 mg by mouth 2 (two) times daily.   Yes [provider]  lidocaine-prilocaine (EMLA) cream APPLY TO PORTACATH 1 HOUR PRIOR TO USE AS NEEDED 11/13/21  Yes [name], MD  losartan-hydrochlorothiazide (HYZAAR) 100-12.5 MG tablet Take 1 tablet by mouth once a day. 12/29/21  Yes   mirtazapine (REMERON) 30 MG tablet Take 1 tablet (30 mg total) by mouth at bedtime. 12/29/21  Yes [name], MD  Multiple Vitamin (MULTIVITAMIN ADULT PO) Take 1 tablet by mouth daily. [place] product   Yes [provider]  ondansetron (ZOFRAN) 8 MG tablet TAKE 1 TABLET BY MOUTH EVERY 8 HOURS AS NEEDED FOR NAUSEA AND VOMITING Patient taking differently: Take 8 mg by mouth every 8 (eight) hours as needed for nausea or vomiting. 11/03/21  Yes Owens Shark, NP  oxyCODONE (OXY IR/ROXICODONE) 5 MG immediate release tablet Take 1 tablet by mouth every 6 hours as needed for severe pain. 12/28/21  Yes Owens Shark, NP  oxyCODONE (OXYCONTIN) 10 mg 12 hr tablet TAKE 2 TABLETS BY MOUTH EVERY 12 HOURS. Patient taking differently: Take 20 mg by mouth every 12 (twelve) hours. 12/28/21  Yes Owens Shark, NP  polyethylene glycol (MIRALAX / GLYCOLAX) 17 g packet Take 17 g by mouth daily.   Yes [provider]  prochlorperazine (COMPAZINE) 10 MG tablet Take 1 tablet by mouth every 6 hours as needed for nausea or vomiting. 01/04/21  Yes Owens Shark, NP  Testosterone 1.62 % GEL Apply 2 pumps topically daily Patient taking differently: Apply 2 Pump topically 2 (two) times a week. 07/04/21  Yes Owens Shark, NP  COVID-19 mRNA vaccine [phone] (COMIRNATY) syringe Inject into the muscle. 12/27/21   Carlyle Basques, MD  predniSONE (DELTASONE) 50 MG tablet Take 1  tablet by mouth 13 hours, 7 hours, and 1 hour prior to CT scan. Patient not taking: Reported on 11/13/2021 11/06/21   [name], MD    Inpatient Medications: Scheduled Meds:  Chlorhexidine Gluconate Cloth  6 each Topical Daily    dorzolamide  1 drop Right Eye TID   hydrocortisone sod succinate (SOLU-CORTEF) inj  30 mg Intravenous QAC breakfast   levETIRAcetam  250 mg Oral BID   oxyCODONE  20 mg Oral Q12H   sodium chloride flush  10-40 mL Intracatheter Q12H   sodium chloride flush  5 mL Intracatheter Q8H   Continuous Infusions:  lactated ringers Stopped (01/11/22 1209)   piperacillin-tazobactam (ZOSYN)  IV 12.5 mL/hr at 01/11/22 1354   PRN Meds: acetaminophen **OR** acetaminophen, hydrALAZINE, morphine injection, ondansetron **OR** ondansetron (ZOFRAN) IV, oxyCODONE  Allergies:    Allergies  Allergen Reactions   Contrast Media [Iodinated Contrast Media] Rash    Social History:   Social History  Socioeconomic History   Marital status: Married    Spouse name: Not on file   Number of children: Not on file   Years of education: Not on file   Highest education level: Not on file  Occupational History   Not on file  Tobacco Use   Smoking status: Never   Smokeless tobacco: Never  Vaping Use   Vaping Use: Never used  Substance and Sexual Activity   Alcohol use: Not Currently    Alcohol/week: 1.0 standard drink of alcohol    Types: 1 Glasses of wine per week   Drug use: Yes    Types: Marijuana    Comment: Last use 06/13/19   Sexual activity: Not Currently  Other Topics Concern   Not on file  Social History Narrative   Not on file   Social Determinants of Health   Financial Resource Strain: Not on file  Food Insecurity: No Food Insecurity (01/08/2022)   Hunger Vital Sign    Worried About Running Out of Food in the Last Year: Never true    Ran Out of Food in the Last Year: Never true  Transportation Needs: No Transportation Needs (01/08/2022)   PRAPARE - Hydrologist (Medical): No    Lack of Transportation (Non-Medical): No  Physical Activity: Not on file  Stress: Not on file  Social Connections: Not on file  Intimate Partner Violence: Not At Risk (01/08/2022)    Humiliation, Afraid, Rape, and Kick questionnaire    Fear of Current or Ex-Partner: No    Emotionally Abused: No    Physically Abused: No    Sexually Abused: No    Family History:   Family History  Problem Relation Age of Onset   Cancer Grandchild        lymphoma     ROS:  Please see the history of present illness.  All other ROS reviewed and negative.     Physical Exam/Data:   Vitals:   01/11/22 0410 01/11/22 0414 01/11/22 0704 01/11/22 1233  BP: (!) 179/70 (!) 172/73 (!) 167/47 (!) 161/74  Pulse: (!) 57 62 (!) 58 73  Resp: 18   20  Temp: 97.7 F (36.5 C)   97.6 F (36.4 C)  TempSrc:    Oral  SpO2: 96% 97%  94%  Weight:      Height:        Intake/Output Summary (Last 24 hours) at 01/11/2022 1402 Last data filed at 01/11/2022 1354 Gross per 24 hour  Intake 410.5 ml  Output 2000 ml  Net -1589.5 ml      01/08/2022    9:24 AM 12/27/2021   10:31 AM 12/05/2021    9:55 AM  Last 3 Weights  Weight (lbs) 250 lb 248 lb 253 lb  Weight (kg) 113.399 kg 112.492 kg 114.76 kg     Body mass index is 34.87 kg/m.  General:  Well nourished, well developed, in no acute distress HEENT: normal Neck: no JVD Vascular: No carotid bruits; Distal pulses 2+ bilaterally Cardiac:  normal S1, S2; RRR; no murmur  Lungs:  clear to auscultation bilaterally, no wheezing, rhonchi or rales  Abd: soft, nontender, no hepatomegaly  Ext: no edema Musculoskeletal:  No deformities, BUE and BLE strength weak but equal Skin: warm and dry  Neuro:  CNs 2-12 intact, no focal abnormalities noted Psych:  Normal affect   EKG:  The EKG was personally reviewed and demonstrates:  ST, HR 135, freq PACs Telemetry:  Telemetry was  personally reviewed and demonstrates:  ST/PACs >> improving HR w/ decreased ectopy >> this am w/ 4.08 sec pause, sinus pause, resolved spontaneously, baseline HR remained in the 80s  Relevant CV Studies: None   Laboratory Data:  High Sensitivity Troponin:  No results for  input(s): "TROPONINIHS" in the last 720 hours.   Chemistry Recent Labs  Lab 01/08/22 1210 01/09/22 0333 01/10/22 0307  NA 137 134* 137  K 4.5 3.5 3.6  CL 96* 96* 100  CO2 _0 GLUCOSE 156* 164* 199*  BUN 18 26* 31*  CREATININE 1.50* 2.81* 1.92*  CALCIUM 8.4* 8.1* 7.7*  MG  --   --  1.7  GFRNONAA 48* 22* 35*  ANIONGAP 16* 13 10    Recent Labs  Lab 01/08/22 1210 01/09/22 0333 01/10/22 0307  PROT 6.9 5.9* 5.6*  ALBUMIN 2.9* 2.4* 2.2*  AST _1 ALT _2 ALKPHOS 79 82 56  BILITOT 1.6* 0.8 0.4   Lipids No results for input(s): "CHOL", "TRIG", "HDL", "LABVLDL", "LDLCALC", "CHOLHDL" in the last 168 hours.  Hematology Recent Labs  Lab 01/08/22 1210 01/09/22 0333 01/10/22 0307  WBC 16.5* 15.9* 11.0*  RBC 5.04 4.05* 3.90*  HGB 13.2 10.5* 10.1*  HCT 42.6 34.6* 33.0*  MCV 84.5 85.4 84.6  MCH 26.2 25.9* 25.9*  MCHC 31.0 30.3 30.6  RDW 17.1* 17.2* 17.5*  PLT 357 266 285   Thyroid No results for input(s): "TSH", "FREET4" in the last 168 hours.  BNPNo results for input(s): "BNP", "PROBNP" in the last 168 hours.  DDimer No results for input(s): "DDIMER" in the last 168 hours.   Radiology/Studies:  IR Perc Cholecystostomy  Result Date: 01/09/2022 INDICATION: 76 year old male referred for percutaneous cholecystostomy EXAM: CHOLECYSTOSTOMY MEDICATIONS: None ANESTHESIA/SEDATION: Moderate (conscious) sedation was employed during this procedure. A total of Versed 0.5 mg and Fentanyl 100 mcg was administered intravenously. Moderate Sedation Time: 22 minutes. The patient's level of consciousness and vital signs were monitored continuously by radiology nursing throughout the procedure under my direct supervision. FLUOROSCOPY TIME:  Fluoroscopy Time: 1 minutes 6 seconds (19 mGy). COMPLICATIONS: None PROCEDURE: Informed written consent was obtained from the patient and the patient's family after a thorough discussion of the procedural risks, benefits and alternatives. All  questions were addressed. Maximal Sterile Barrier Technique was utilized including caps, mask, sterile gowns, sterile gloves, sterile drape, hand hygiene and skin antiseptic. A timeout was performed prior to the initiation of the procedure. Ultrasound survey of the right upper quadrant was performed for planning purposes. Once the patient is prepped and draped in the usual sterile fashion, the skin and subcutaneous tissues overlying the gallbladder were generously infiltrated 1% lidocaine for local anesthesia. A coaxial needle was advanced under ultrasound guidance through the skin subcutaneous tissues and a small segment of liver into the gallbladder lumen. With removal of the stylet, spontaneous dark bile drainage occurred. Using modified Seldinger technique, a 10 French drain was placed into the gallbladder fossa, with aspiration of the sample for the lab. Contrast injection confirmed position of the tube within the gallbladder lumen. Drainage catheter was attached to gravity drain with a suture retention placed. Patient tolerated the procedure well and remained hemodynamically stable throughout. No complications were encountered and no significant blood loss encountered. IMPRESSION: Status post percutaneous cholecystostomy Signed, Dulcy Fanny. Nadene Rubins, RPVI [facility] Electronically Signed   By: Corrie Mckusick D.O.   On: 01/09/2022 14:10   US Abdomen  Limited RUQ (LIVER/GB)  Result Date: 01/08/2022 CLINICAL DATA:  Right upper quadrant abdominal pain. EXAM: ULTRASOUND ABDOMEN LIMITED RIGHT UPPER QUADRANT COMPARISON:  CT of the abdomen pelvis 01/08/2022 FINDINGS: Gallbladder: Gallbladder wall thickening measures up to 6 mm. Multiple shadowing stones are present in the neck of the gallbladder. No sonographic Percell Miller sign was reported. Common bile duct: Diameter: 4.0 mm, within normal limits Liver: Liver is diffusely echogenic. No discrete lesions  are present. Portal vein is patent on color Doppler imaging with normal direction of blood flow towards the liver. Other: None. IMPRESSION: 1. Cholelithiasis with gallbladder wall thickening. Findings are equivocal for acute cholecystitis. 2. Fatty infiltration of the liver. Electronically Signed   By: San Morelle M.D.   On: 01/08/2022 12:04   CT Chest Wo Contrast  Result Date: 01/08/2022 CLINICAL DATA:  History of renal cell carcinoma currently on treatment increased weakness, abdominal pain, nausea and vomiting EXAM: CT CHEST, ABDOMEN AND PELVIS WITHOUT CONTRAST TECHNIQUE: Multidetector CT imaging of the chest, abdomen and pelvis was performed following the standard protocol without IV contrast. RADIATION DOSE REDUCTION: This exam was performed according to the departmental dose-optimization program which includes automated exposure control, adjustment of the mA and/or kV according to patient size and/or use of iterative reconstruction technique. COMPARISON:  CT chest, abdomen, and pelvis dated 11/10/2021 FINDINGS: CT CHEST FINDINGS Cardiovascular: Right chest wall port tip terminates in the lower SVC. Normal heart size. No significant pericardial fluid/thickening. Coronary artery calcifications. Great vessels are normal in course and caliber. Aortic atherosclerosis. Mediastinum/Nodes: thyroid gland without nodules meeting criteria for imaging follow-up by size. Normal esophagus. No pathologically enlarged axillary, supraclavicular, mediastinal, or hilar lymph nodes. Lungs/Pleura: There is mild respiratory motion artifact. The central airways are patent. No focal consolidation. Decreased conspicuity of previously noted right upper lobe nodules measuring average 4 mm (6:41) and 3 mm (6:65). No definite new or enlarging pulmonary nodules. No pneumothorax. No pleural effusion. Musculoskeletal: Similar multifocal osseous metastases, most notably mixed lytic and sclerotic appearance of T1 and T9 and  similar expansile lytic lesion in right T11 costovertebral junction. Slightly increased sclerotic component of the left 4th rib (5:187). CT ABDOMEN PELVIS FINDINGS Hepatobiliary: Similar segment 4 cyst (2:53). Previously noted segment 3 cystic focus is not well seen. No new focal hepatic lesions. No intra or extrahepatic biliary ductal dilation. Distended gallbladder containing gallstones. Similar gallbladder fundal adenomyomatosis. New mild pericholecystic stranding. Pancreas: No pancreatic ductal dilatation or surrounding inflammatory changes. Spleen: Normal in size without focal abnormality. Adrenals/Urinary Tract: No adrenal nodules. No substantial change in size of treated left upper pole lesion measuring 2.2 x 2.0 cm (2:67). Unchanged subcentimeter exophytic focus along the anterior lower pole left kidney (2:81). No new or enlarging renal masses. No calculi or hydronephrosis. No focal bladder wall thickening. Stomach/Bowel: Normal appearance of the stomach. No evidence of bowel wall thickening, distention, or inflammatory changes. Normal appendix. Vascular/Lymphatic: No significant vascular findings are present. No enlarged abdominal or pelvic lymph nodes. Reproductive: Enlarged prostate gland. Other: Increased small volume free fluid.  No fluid collection. Musculoskeletal: Multifocal osseous metastases with increased soft tissue component of a lytic left acetabular metastasis, for example measuring 3.9 x 2.4 cm inferiorly (2:126), previously 3.2 x 2.2 cm. Unchanged compression of L1 and L4. IMPRESSION: 1. Distended gallbladder containing gallstones with new mild pericholecystic stranding, which may reflect acute cholecystitis. Recommend correlation with right upper quadrant ultrasound for further evaluation. 2. Increased small volume free fluid in the abdomen and pelvis. 3. Multifocal osseous metastases with  increased soft tissue component of a lytic left acetabular metastasis and increased sclerotic  component of a left 4th rib metastasis. 4. Decreased conspicuity of previously noted right upper lobe pulmonary nodules. No definite new or enlarging pulmonary nodules. 5. No substantial change in size of treated left upper pole renal lesion. 6. Aortic atherosclerosis. Aortic Atherosclerosis (ICD10-I70.0). Electronically Signed   By: Darrin Nipper M.D.   On: 01/08/2022 11:04   CT ABDOMEN PELVIS WO CONTRAST  Result Date: 01/08/2022 CLINICAL DATA:  History of renal cell carcinoma currently on treatment increased weakness, abdominal pain, nausea and vomiting EXAM: CT CHEST, ABDOMEN AND PELVIS WITHOUT CONTRAST TECHNIQUE: Multidetector CT imaging of the chest, abdomen and pelvis was performed following the standard protocol without IV contrast. RADIATION DOSE REDUCTION: This exam was performed according to the departmental dose-optimization program which includes automated exposure control, adjustment of the mA and/or kV according to patient size and/or use of iterative reconstruction technique. COMPARISON:  CT chest, abdomen, and pelvis dated 11/10/2021 FINDINGS: CT CHEST FINDINGS Cardiovascular: Right chest wall port tip terminates in the lower SVC. Normal heart size. No significant pericardial fluid/thickening. Coronary artery calcifications. Great vessels are normal in course and caliber. Aortic atherosclerosis. Mediastinum/Nodes: thyroid gland without nodules meeting criteria for imaging follow-up by size. Normal esophagus. No pathologically enlarged axillary, supraclavicular, mediastinal, or hilar lymph nodes. Lungs/Pleura: There is mild respiratory motion artifact. The central airways are patent. No focal consolidation. Decreased conspicuity of previously noted right upper lobe nodules measuring average 4 mm (6:41) and 3 mm (6:65). No definite new or enlarging pulmonary nodules. No pneumothorax. No pleural effusion. Musculoskeletal: Similar multifocal osseous metastases, most notably mixed lytic and sclerotic  appearance of T1 and T9 and similar expansile lytic lesion in right T11 costovertebral junction. Slightly increased sclerotic component of the left 4th rib (5:187). CT ABDOMEN PELVIS FINDINGS Hepatobiliary: Similar segment 4 cyst (2:53). Previously noted segment 3 cystic focus is not well seen. No new focal hepatic lesions. No intra or extrahepatic biliary ductal dilation. Distended gallbladder containing gallstones. Similar gallbladder fundal adenomyomatosis. New mild pericholecystic stranding. Pancreas: No pancreatic ductal dilatation or surrounding inflammatory changes. Spleen: Normal in size without focal abnormality. Adrenals/Urinary Tract: No adrenal nodules. No substantial change in size of treated left upper pole lesion measuring 2.2 x 2.0 cm (2:67). Unchanged subcentimeter exophytic focus along the anterior lower pole left kidney (2:81). No new or enlarging renal masses. No calculi or hydronephrosis. No focal bladder wall thickening. Stomach/Bowel: Normal appearance of the stomach. No evidence of bowel wall thickening, distention, or inflammatory changes. Normal appendix. Vascular/Lymphatic: No significant vascular findings are present. No enlarged abdominal or pelvic lymph nodes. Reproductive: Enlarged prostate gland. Other: Increased small volume free fluid.  No fluid collection. Musculoskeletal: Multifocal osseous metastases with increased soft tissue component of a lytic left acetabular metastasis, for example measuring 3.9 x 2.4 cm inferiorly (2:126), previously 3.2 x 2.2 cm. Unchanged compression of L1 and L4. IMPRESSION: 1. Distended gallbladder containing gallstones with new mild pericholecystic stranding, which may reflect acute cholecystitis. Recommend correlation with right upper quadrant ultrasound for further evaluation. 2. Increased small volume free fluid in the abdomen and pelvis. 3. Multifocal osseous metastases with increased soft tissue component of a lytic left acetabular metastasis and  increased sclerotic component of a left 4th rib metastasis. 4. Decreased conspicuity of previously noted right upper lobe pulmonary nodules. No definite new or enlarging pulmonary nodules. 5. No substantial change in size of treated left upper pole renal lesion. 6. Aortic atherosclerosis. Aortic  Atherosclerosis (ICD10-I70.0). Electronically Signed   By: Darrin Nipper M.D.   On: 01/08/2022 11:04   CT Head Wo Contrast  Result Date: 01/08/2022 CLINICAL DATA:  Worsening weakness.  Known brain metastases. EXAM: CT HEAD WITHOUT CONTRAST TECHNIQUE: Contiguous axial images were obtained from the base of the skull through the vertex without intravenous contrast. RADIATION DOSE REDUCTION: This exam was performed according to the departmental dose-optimization program which includes automated exposure control, adjustment of the mA and/or kV according to patient size and/or use of iterative reconstruction technique. COMPARISON:  Head MRI 01/02/2022 FINDINGS: Brain: There is no evidence of an acute infarct, acute intracranial hemorrhage, midline shift, or extra-axial fluid collection. Two known small metastases in the medial left frontal lobe are partially calcified. Mild-to-moderate left frontal lobe edema is similar to the prior MRI. Smaller areas of edema or gliosis in the left temporal and left occipital lobes are unchanged. 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 There is a background of mild chronic small vessel ischemia elsewhere in the cerebral white matter. Vascular: Calcified atherosclerosis at the skull base. No hyperdense vessel. Skull: No fracture or suspicious osseous lesion. Sinuses/Orbits: Mild bilateral ethmoid air cell mucosal thickening. Clear mastoid air cells. Unremarkable orbits. Other: None. IMPRESSION: Known brain metastases without evidence of worsening edema, acute hemorrhage, or other acute intracranial abnormality. Electronically Signed   By: Logan Bores M.D.   On:  01/08/2022 10:36   DG Chest Port 1 View  Result Date: 01/08/2022 CLINICAL DATA:  76 year old male with history of hypoxia and weakness. History of renal cell carcinoma. EXAM: PORTABLE CHEST 1 VIEW COMPARISON:  No prior chest x-ray.  Chest CT 11/10/2021. FINDINGS: Right-sided internal jugular single-lumen Port-A-Cath with tip terminating at the superior cavoatrial junction. Lung volumes are normal. No consolidative airspace disease. No pleural effusions. No pneumothorax. No pulmonary nodule or mass noted. Pulmonary vasculature and the cardiomediastinal silhouette are within normal limits. Atherosclerotic calcifications in the thoracic aorta. IMPRESSION: 1. No radiographic evidence of acute cardiopulmonary disease. No definite signs of metastatic disease in the thorax. Electronically Signed   By: Vinnie Langton M.D.   On: 01/08/2022 09:38     Assessment and Plan:   4 sec pause - Keytruda can cause cardiac arrhythmia, type not specified, 4-11% -Lenvatinib can cause prolonged QT, no mention is made of any other arrhythmias. -He was completely asymptomatic at the time, baseline heart rate did not decrease -We will check an EKG, keep on telemetry while here -Because of the chemotherapy, will check an echo -He had a TSH in October that was normal, will not recheck -MD advise if he needs a monitor at discharge -Reviewed data with the patient and he understands -No indication for pacemaker at this time  2. Renal cell CA w/ brain mets and other issues, per IM   Risk Assessment/Risk Scores:       For questions or updates, please contact [place] Please consult [email or web address] for contact info under    Signed, Rosaria Ferries, PA-C  01/11/2022 2:02 PM  Personally seen and examined. Agree with APP above with the following comments:  Briefly 76 yo M with HTN and metastatic renal cancer who has asymptomatic 4.9 s sinus pause. Discussed his history at length, he is a recent newly  wed from his home church in [place]. He has no cardiologist. Was scared when the people told him about the pause.  He was resting in bed at 7 AM and had no symptoms. He  is on immunotherapy. He is being treated for sepsis and had a per crain and IVC ABX. Metabolic encephalopathy from admission has resolved. He is on no AV nodal agents.   No CP No SOB No Palpitations No syncope.  Exam notable for RRR; has R sided PICC.  Labs notable for K 3.6 Creatinine improved from 2.81 to 1.92 Albumin 2.2 WBC 15-> 11 EKG sinus tachycardia with PACs Tele: 4.09 second sinus pause  For key conditions including  Metastatic renal cell cancer Asymptomatic sinus pause Acute cholecystits  Would recommend  No procedural intervention  Presently he had a < 5 second sinus pause without symptoms.  He has an acute infection and PPM is not appropriate.  Further I am unclear of his overall prognosis and may not be greater than 1 year; if needing PPM would clarify further. Beryle Flock can cause bradycardia (rarely) but there are not many options for his immunotherapy Will discuss this again with him and his wife if coming tomorrow and may order monitor for symptomatic bradycardia decision; after he finishes ABX.  Rudean Haskell, MD [facility] [address] [place], [place] [address] [phone]  3:54 PM

## 2022-01-11 NOTE — Progress Notes (Signed)
Mobility Specialist - Progress Note   01/11/22 1228  Mobility  Activity Ambulated with assistance to bathroom  Level of Assistance Moderate assist, patient does 50-74%  Assistive Device Front wheel walker  Distance Ambulated (ft) 20 ft  Range of Motion/Exercises Active  Activity Response Tolerated well  Mobility Referral Yes  $Mobility charge 1 Mobility   Pt was found in bed and agreeable to ambulate. Was mod-A when going from lying to sitting and was contact guard for ambulation to bathroom. Afterwards pt was left on recliner chair with RN in room.  Ferd Hibbs Mobility Specialist

## 2022-01-11 NOTE — Care Management Important Message (Signed)
Important Message  Patient Details IM Letter given Name: Phillip [name] MRN: [id number] Date of Birth: Jun 15, 1945   Medicare Important Message Given:  Yes     [name] 01/11/2022, 1:30 PM

## 2022-01-11 NOTE — Progress Notes (Signed)
PROGRESS NOTE  Phillip [name]  DOB: Feb 26, 1946  PCP: Phillip Huddle, MD EQA:[id number]  DOA: 01/08/2022  LOS: 3 days  Hospital Day: 4  Brief narrative: Phillip [name] is a 76 y.o. 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  On work-up in the ED, patient was found to have septic with leukocytosis, lactic acid elevation, AKI. Right upper quadrant ultrasound showed acute cholecystitis. General surgery was consulted and recommended IR consult for percutaneous cholecystostomy tube. Patient was empirically started on IV Zosyn Admitted to [facility] See below for details.  Subjective: Patient was seen and examined this morning.  Pleasant elderly Caucasian male.  Lying on bed.  Not in distress.  No new symptoms. Cholecystostomy tube with serosanguineous drainage. Chart reviewed In the last 24 hours, no fever, heart rate in 50s and 60s.  Blood pressure in 160s and 170s Telemetry reviewed.  This morning, patient had a sinus pause of 3.4-second.  Not on any scheduled AV nodal blocking agent.  Assessment and plan: Sepsis secondary to acute cholecystitis - POA S/p percutaneous cholecystostomy drain placement by IR.  Plan is to continue drain at least 4 to 6 weeks unless gallbladder removal in the interim by general surgery. No fever.  WBC count improving.  Lactic acid level improving. Currently on IV Zosyn.  Tentative plan to switch to oral Augmentin at discharge. Recent Labs  Lab 01/08/22 0931 01/08/22 1210 01/08/22 1522 01/09/22 0333 01/10/22 0307  WBC  --  16.5*  --  15.9* 11.0*  LATICACIDVEN 2.3*  --  2.0*  --   --   PROCALCITON  --   --   --  3.81  --    Acute metabolic encephalopathy likely secondary to acute infection Patient had generalized weakness and slowed responsiveness on admission.   Likely due to infection.   Mental status improved and is currently back to baseline.   Acute kidney injury Baseline creatinine normal.  Presented with creatinine elevated to 1.5 which worsened to peak at 2.81 on 11/14, was up to 1.92 yesterday.  Pending labs today. Continue IV fluid. Recent Labs    08/21/21 1120 09/11/21 1049 10/02/21 1122 10/23/21 1059 11/13/21 1040 12/05/21 0901 12/27/21 1017 01/08/22 1210 01/09/22 0333 01/10/22 0307  BUN '17 16 17 '$ 24* '18 14 17 18 '$ 26* 31*  CREATININE 0.86 0.85 0.96 1.08 0.86 0.84 0.82 1.50* 2.81* 1.92*   Sinus pause Patient had a sinus pause of 3.4-second this morning.  Otherwise, heart rate in 50s and 60s mostly While in the hospital, patient was kept on metoprolol IV as needed.  He received 1 dose of 2.5 mg of metoprolol yesterday morning.  Not on any scheduled AV nodal blocking agent at home or here.  Essential hypertension PTA on losartan 100 mg daily, HCTZ 12.5 mg daily Currently on hold because of AKI.  Blood pressure running elevated to 160s 170s today. Continue hydralazine as needed.  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 Continue follow-up with radiation oncology/heme-onc.  Outpatient For seizure prophylaxis, continue Keppra 250 mg twice daily for seizure prophylaxis For pain control, patient is on OxyContin 20 mg twice daily, oxycodone every 6 hours as needed . For bowel regimen, Senokot, MiraLAX For appetite stimulation, on Remeron 30 mg at bedtime for   ?? Chronic steroids ?  Indication.  PTA on hydrocortisone 20 mg a.m. and  10 mg p.m. Currently on IV Solu-Cortef 30 mg daily.    Chronic mild anemia Vitamin B12 deficiency Hemoglobin stable over 10.  Continue vitamin B12 supplement Recent Labs    03/07/21 1444 03/28/21 1135 12/05/21 0901 12/27/21 1017 01/08/22 1210 01/09/22 0333 01/10/22 0307  HGB  --    < > 10.4* 10.3* 13.2 10.5* 10.1*  MCV  --    < > 86.6 84.8 84.5 85.4 84.6  VITAMINB12 159*  --   --   --   --   --   --    <  > = values in this interval not displayed.    Goals of care   Code Status: DNR    Mobility: Seen by PT.  Home with PT recommended.  Skin assessment:     Nutritional status:  Body mass index is 34.87 kg/m.          Diet:  Diet Order             Diet full liquid Room service appropriate? Yes; Fluid consistency: Thin  Diet effective now                   DVT prophylaxis:  SCDs Start: 01/08/22 1658   Antimicrobials: IV Zosyn Fluid: Currently on LR 125 mill per hour. Consultants: Cardiology Family Communication: None at bedside  Status is: Inpatient  Continue in-hospital care because: Sinus pause noted on telemetry.  Cardiology consult pending Level of care: Progressive   Dispo: The patient is from: Home              Anticipated d/c is to: Pending clinical course              Patient currently is not medically stable to d/c.   Difficult to place patient No     Infusions:   lactated ringers 125 mL/hr at 01/11/22 0508   piperacillin-tazobactam (ZOSYN)  IV 3.375 g (01/11/22 0509)    Scheduled Meds:  Chlorhexidine Gluconate Cloth  6 each Topical Daily   dorzolamide  1 drop Right Eye TID   hydrocortisone sod succinate (SOLU-CORTEF) inj  30 mg Intravenous QAC breakfast   levETIRAcetam  250 mg Oral BID   oxyCODONE  20 mg Oral Q12H   sodium chloride flush  10-40 mL Intracatheter Q12H   sodium chloride flush  5 mL Intracatheter Q8H    PRN meds: acetaminophen **OR** acetaminophen, hydrALAZINE, morphine injection, ondansetron **OR** ondansetron (ZOFRAN) IV, oxyCODONE   Antimicrobials: Anti-infectives (From admission, onward)    Start     Dose/Rate Route Frequency Ordered Stop   01/08/22 2100  piperacillin-tazobactam (ZOSYN) IVPB 3.375 g        3.375 g 12.5 mL/hr over 240 Minutes Intravenous Every 8 hours 01/08/22 1527     01/08/22 1245  piperacillin-tazobactam (ZOSYN) IVPB 3.375 g        3.375 g 100 mL/hr over 30 Minutes Intravenous  Once 01/08/22  1241 01/08/22 1326       Objective: Vitals:   01/11/22 0704 01/11/22 1233  BP: (!) 167/47 (!) 161/74  Pulse: (!) 58 73  Resp:  20  Temp:  97.6 F (36.4 C)  SpO2:  94%    Intake/Output Summary (Last 24 hours) at 01/11/2022 1310 Last data filed at 01/11/2022 0754 Gross per 24 hour  Intake 210.5 ml  Output 2000 ml  Net -1789.5 ml   Filed Weights   01/08/22 0924  Weight: 113.4 kg   Weight change:  Body mass index is  34.87 kg/m.   Physical Exam: General exam: Pleasant, elderly Caucasian male.  Not in distress Skin: No rashes, lesions or ulcers. HEENT: Atraumatic, normocephalic, no obvious bleeding Lungs: Clear to auscultation bilaterally CVS: Regular rate and rhythm, no murmur GI/Abd soft, nontender, nondistended, bowels are present.  Cholecystostomy tube with serosanguineous drainage CNS: Alert, awake, oriented x3 Psychiatry: Mood appropriate Extremities: No pedal edema, no calf redness  Data Review: I have personally reviewed the laboratory data and studies available.  F/u labs ordered Unresulted Labs (From admission, onward)     Start     Ordered   01/12/22 0500  CBC with Differential/Platelet  Daily at 5am,   R     Question:  Specimen collection method  Answer:  IV Team=IV Team collect   01/11/22 0758   01/12/22 6433  Basic metabolic panel  Daily at 5am,   R     Question:  Specimen collection method  Answer:  IV Team=IV Team collect   01/11/22 0758   01/11/22 0759  CBC  ONCE - STAT,   STAT       Question:  Specimen collection method  Answer:  IV Team=IV Team collect   01/11/22 0758   01/11/22 2951  Basic metabolic panel  ONCE - STAT,   STAT       Question:  Specimen collection method  Answer:  IV Team=IV Team collect   01/11/22 0758   01/11/22 0759  Lactic acid, plasma  ONCE - STAT,   STAT       Question:  Specimen collection method  Answer:  IV Team=IV Team collect   01/11/22 0758   01/11/22 0759  Magnesium  ONCE - STAT,   STAT       Question:  Specimen  collection method  Answer:  IV Team=IV Team collect   01/11/22 0758            Signed, Terrilee Croak, MD [facility] 01/11/2022

## 2022-01-12 ENCOUNTER — Other Ambulatory Visit: Payer: Self-pay | Admitting: Physician Assistant

## 2022-01-12 ENCOUNTER — Other Ambulatory Visit (HOSPITAL_COMMUNITY): Payer: Self-pay

## 2022-01-12 ENCOUNTER — Inpatient Hospital Stay (HOSPITAL_COMMUNITY): Payer: PPO

## 2022-01-12 ENCOUNTER — Inpatient Hospital Stay (INDEPENDENT_AMBULATORY_CARE_PROVIDER_SITE_OTHER): Payer: PPO

## 2022-01-12 ENCOUNTER — Telehealth: Payer: Self-pay | Admitting: Radiation Therapy

## 2022-01-12 ENCOUNTER — Other Ambulatory Visit (HOSPITAL_BASED_OUTPATIENT_CLINIC_OR_DEPARTMENT_OTHER): Payer: Self-pay

## 2022-01-12 DIAGNOSIS — R9431 Abnormal electrocardiogram [ECG] [EKG]: Secondary | ICD-10-CM | POA: Diagnosis not present

## 2022-01-12 DIAGNOSIS — K810 Acute cholecystitis: Secondary | ICD-10-CM | POA: Diagnosis not present

## 2022-01-12 DIAGNOSIS — I455 Other specified heart block: Secondary | ICD-10-CM

## 2022-01-12 LAB — BASIC METABOLIC PANEL
Anion gap: 6 (ref 5–15)
BUN: 17 mg/dL (ref 8–23)
CO2: 30 mmol/L (ref 22–32)
Calcium: 8.6 mg/dL — ABNORMAL LOW (ref 8.9–10.3)
Chloride: 105 mmol/L (ref 98–111)
Creatinine, Ser: 0.96 mg/dL (ref 0.61–1.24)
GFR, Estimated: >60 mL/min (ref >60)
Glucose, Bld: 109 mg/dL — ABNORMAL HIGH (ref 70–99)
Potassium: 3 mmol/L — ABNORMAL LOW (ref 3.5–5.1)
Sodium: 141 mmol/L (ref 135–145)

## 2022-01-12 LAB — LACTIC ACID, PLASMA: Lactic Acid, Venous: 0.9 mmol/L (ref 0.5–1.9)

## 2022-01-12 LAB — ECHOCARDIOGRAM COMPLETE
AR max vel: 2.91 cm2
AV Area VTI: 3.24 cm2
AV Area mean vel: 2.92 cm2
AV Mean grad: 8.3 mmHg
AV Peak grad: 16.1 mmHg
Ao pk vel: 2.01 m/s
Area-P 1/2: 3.07 cm2
Calc EF: 71 %
Height: 71 in
S' Lateral: 3.1 cm
Single Plane A2C EF: 71.6 %
Single Plane A4C EF: 71.4 %
Weight: 4000 oz

## 2022-01-12 LAB — CBC WITH DIFFERENTIAL/PLATELET
Abs Immature Granulocytes: 0.02 10*3/uL (ref 0.00–0.07)
Basophils Absolute: 0 10*3/uL (ref 0.0–0.1)
Basophils Relative: 1 %
Eosinophils Absolute: 0.7 10*3/uL — ABNORMAL HIGH (ref 0.0–0.5)
Eosinophils Relative: 9 %
HCT: 32.3 % — ABNORMAL LOW (ref 39.0–52.0)
Hemoglobin: 9.5 g/dL — ABNORMAL LOW (ref 13.0–17.0)
Immature Granulocytes: 0 %
Lymphocytes Relative: 22 %
Lymphs Abs: 1.8 10*3/uL (ref 0.7–4.0)
MCH: 25.6 pg — ABNORMAL LOW (ref 26.0–34.0)
MCHC: 29.4 g/dL — ABNORMAL LOW (ref 30.0–36.0)
MCV: 87.1 fL (ref 80.0–100.0)
Monocytes Absolute: 0.8 10*3/uL (ref 0.1–1.0)
Monocytes Relative: 10 %
Neutro Abs: 4.7 10*3/uL (ref 1.7–7.7)
Neutrophils Relative %: 58 %
Platelets: 239 10*3/uL (ref 150–400)
RBC: 3.71 MIL/uL — ABNORMAL LOW (ref 4.22–5.81)
RDW: 17.6 % — ABNORMAL HIGH (ref 11.5–15.5)
WBC: 7.9 10*3/uL (ref 4.0–10.5)
nRBC: 0 % (ref 0.0–0.2)

## 2022-01-12 LAB — MAGNESIUM: Magnesium: 2.1 mg/dL (ref 1.7–2.4)

## 2022-01-12 MED ORDER — LOSARTAN POTASSIUM 50 MG PO TABS
100.0000 mg | ORAL_TABLET | Freq: Every day | ORAL | Status: DC
  Administered 2022-01-12: 100 mg via ORAL
  Filled 2022-01-12: qty 2

## 2022-01-12 MED ORDER — POTASSIUM CHLORIDE CRYS ER 20 MEQ PO TBCR
40.0000 meq | EXTENDED_RELEASE_TABLET | Freq: Once | ORAL | Status: AC
  Administered 2022-01-12: 40 meq via ORAL
  Filled 2022-01-12: qty 2

## 2022-01-12 MED ORDER — PERFLUTREN LIPID MICROSPHERE
1.0000 mL | INTRAVENOUS | Status: AC | PRN
  Administered 2022-01-12: 2 mL via INTRAVENOUS

## 2022-01-12 MED ORDER — HYDROCHLOROTHIAZIDE 12.5 MG PO TABS
12.5000 mg | ORAL_TABLET | Freq: Every day | ORAL | Status: DC
  Administered 2022-01-12: 12.5 mg via ORAL
  Filled 2022-01-12: qty 1

## 2022-01-12 MED ORDER — SACCHAROMYCES BOULARDII 250 MG PO CAPS
250.0000 mg | ORAL_CAPSULE | Freq: Two times a day (BID) | ORAL | 0 refills | Status: AC
  Filled 2022-01-12: qty 20, 10d supply, fill #0

## 2022-01-12 MED ORDER — LOSARTAN POTASSIUM-HCTZ 100-12.5 MG PO TABS: 1.0000 | ORAL_TABLET | Freq: Every day | ORAL | Status: DC

## 2022-01-12 MED ORDER — POTASSIUM CHLORIDE CRYS ER 20 MEQ PO TBCR: 40.0000 meq | EXTENDED_RELEASE_TABLET | Freq: Once | ORAL | Status: DC

## 2022-01-12 MED ORDER — HEPARIN SOD (PORK) LOCK FLUSH 100 UNIT/ML IV SOLN
500.0000 [IU] | INTRAVENOUS | Status: AC | PRN
  Administered 2022-01-12: 500 [IU]
  Filled 2022-01-12: qty 5

## 2022-01-12 MED ORDER — AMOXICILLIN-POT CLAVULANATE 875-125 MG PO TABS
1.0000 | ORAL_TABLET | Freq: Two times a day (BID) | ORAL | 0 refills | Status: AC
  Filled 2022-01-12: qty 20, 10d supply, fill #0

## 2022-01-12 NOTE — Discharge Summary (Signed)
Physician Discharge Summary  Phillip [name] HAL:[id number] DOB: February 14, 1946 DOA: 01/08/2022  PCP: [name], MD  Admit date: 01/08/2022 Discharge date: 01/12/2022  Admitted From: Home Discharge disposition: Home with home health  Recommendations at discharge:  Take oral Augmentin for 10 days at discharge to complete 2 weeks course of antibiotics with probiotics. Instructions prior for drain management at home Follow-up with IR as an outpatient Follow-up with cardiology as an outpatient.   Brief narrative: Phillip [name] is a 76 y.o. 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  On work-up in the ED, patient was found to have septic with leukocytosis, lactic acid elevation, AKI. Right upper quadrant ultrasound showed acute cholecystitis. General surgery was consulted and recommended IR consult for percutaneous cholecystostomy tube. Patient was empirically started on IV Zosyn Admitted to [facility] See below for details.  Subjective: Patient was seen and examined this morning.  Sitting up in chair.  Not in distress.  Wife at bedside.  Wife is a retired Marine scientist and has good amount of medical information.   Hospital course: Sepsis secondary to acute cholecystitis - POA S/p percutaneous cholecystostomy drain placement by IR.  Plan is to continue drain at least 4 to 6 weeks unless gallbladder removal in the interim by general surgery. No fever.  WBC count improving.  Lactic acid level improving. Currently improving on IV Zosyn.  Switch to oral Augmentin for 10 days at discharge to complete 2 weeks course of antibiotics.  With probiotics. Instructions prior for drain management at home Follow-up with IR as an outpatient for drain removal Recent Labs  Lab 01/08/22 0931 01/08/22 1210 01/08/22 1522 01/09/22 0333  01/10/22 0307 01/12/22 0422  WBC  --  16.5*  --  15.9* 11.0* 7.9  LATICACIDVEN 2.3*  --  2.0*  --   --  0.9  PROCALCITON  --   --   --  3.81  --   --    Acute metabolic encephalopathy likely secondary to acute infection Patient had generalized weakness and slowed responsiveness on admission.   Likely due to infection.  Mental status improved and is currently back to baseline.   Acute kidney injury Baseline creatinine normal.  Presented with creatinine elevated to 1.5 which worsened to peak at 2.81 on 11/14.  Significantly improved to normal today. Recent Labs    09/11/21 1049 10/02/21 1122 10/23/21 1059 11/13/21 1040 12/05/21 0901 12/27/21 1017 01/08/22 1210 01/09/22 0333 01/10/22 0307 01/12/22 0422  BUN 16 17 24* '18 14 17 18 '$ 26* 31* 17  CREATININE 0.85 0.96 1.08 0.86 0.84 0.82 1.50* 2.81* 1.92* 0.96   Sinus pause Patient had a sinus pause of 4.09-second pause on the morning of 11/16.  Otherwise, heart rate in 50s and 60s mostly.  Unclear etiology. Not on any scheduled AV nodal blocking agent at home or here. Cardiology consultation was obtained.  Noted a plan of outpatient monitoring by cardiology.   Essential hypertension PTA on losartan 100 mg daily, HCTZ 12.5 mg daily It was initially held because of AKI.  Blood pressure running elevated to 160s 170s today.  Both resumed.  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 Continue follow-up with radiation oncology/heme-onc.  Outpatient For seizure prophylaxis, continue Keppra 250 mg twice daily for seizure prophylaxis For pain control, patient is on OxyContin 20 mg twice daily, oxycodone every  6 hours as needed . For bowel regimen, Senokot, MiraLAX For appetite stimulation, on Remeron 30 mg at bedtime for   Adrenal insufficiency Per patient's wife, because of long-term use of prednisone, patient has an renal insufficiency and is currently on hydrocortisone 20 mg a.m. and 10 mg p.m. Continue the same at home.   Also on chronic vitamin D supplementation.   Chronic mild anemia Vitamin B12 deficiency Hemoglobin stable over 10.  Continue vitamin B12 supplement Recent Labs    03/07/21 1444 03/28/21 1135 12/27/21 1017 01/08/22 1210 01/09/22 0333 01/10/22 0307 01/12/22 0422  HGB  --    < > 10.3* 13.2 10.5* 10.1* 9.5*  MCV  --    < > 84.8 84.5 85.4 84.6 87.1  VITAMINB12 159*  --   --   --   --   --   --    < > = values in this interval not displayed.   Wounds:  - Incision (Closed) 11/18/15 Back Lower (Active)  Date First Assessed/Time First Assessed: 11/18/15 1535   Location: Back  Location Orientation: Lower  Present on Admission: No    Assessments 11/18/2015  3:48 PM  Dressing Type Gauze (Comment);Transparent dressing  Dressing Clean;Dry;Intact  Dressing Change Frequency PRN  Site / Wound Assessment Clean;Dry  Closure None  Drainage Amount None  Treatment Cleansed     No associated orders.    Discharge Exam:   Vitals:   01/11/22 0414 01/11/22 0704 01/11/22 1233 01/12/22 0515  BP: (!) 172/73 (!) 167/47 (!) 161/74 (!) 168/85  Pulse: 62 (!) 58 73 67  Resp:   20 18  Temp:   97.6 F (36.4 C) 99.4 F (37.4 C)  TempSrc:   Oral Oral  SpO2: 97%  94% 93%  Weight:      Height:        Body mass index is 34.87 kg/m.  General exam: Pleasant, elderly Caucasian male.  Not in distress Skin: No rashes, lesions or ulcers. HEENT: Atraumatic, normocephalic, no obvious bleeding Lungs: Clear to auscultation bilaterally CVS: Regular rate and rhythm, no murmur GI/Abd soft, nontender, nondistended, bowels are present.  Cholecystostomy tube with serosanguineous drainage CNS: Alert, awake, oriented x3 Psychiatry: Mood appropriate Extremities: No pedal edema, no calf redness  Follow ups:    Follow-up Information     Coralie Keens, MD. Go on 03/01/2022.   Specialty: General Surgery Why: 10:20 AM. Please arrive 30 min prior to appointment time to check in. Have ID and insurance card with  you. Contact information: [address] [place] [address] [phone]         [name], MD Follow up.   Specialty: Internal Medicine Contact information: [address] [place] [address] [phone]                 Discharge Instructions:   Discharge Instructions     Call MD for:  difficulty breathing, headache or visual disturbances   Complete by: As directed    Call MD for:  extreme fatigue   Complete by: As directed    Call MD for:  hives   Complete by: As directed    Call MD for:  persistant dizziness or light-headedness   Complete by: As directed    Call MD for:  persistant nausea and vomiting   Complete by: As directed    Call MD for:  severe uncontrolled pain   Complete by: As directed    Call MD for:  temperature >100.4   Complete by: As directed    Diet general   Complete by: As directed    Discharge instructions   Complete by: As directed    Recommendations at discharge:   Take oral Augmentin for 10 days at discharge to complete 2 weeks course of antibiotics with probiotics.  Instructions prior for drain management at home  Follow-up with IR as an outpatient  Follow-up with cardiology as an outpatient.  General discharge instructions: Follow with Primary MD [name], MD in 7 days  Please request your PCP  to go over your hospital tests, procedures, radiology results at the follow up. Please get your medicines reviewed and adjusted.  Your PCP may decide to repeat certain labs or tests as needed. Do not drive, operate heavy machinery, perform activities at heights, swimming or participation in water activities or provide baby sitting services if your were admitted for syncope or siezures until you have seen by Primary MD or a Neurologist and advised to do so again. [place] Controlled Substance Reporting System database was reviewed. Do not drive, operate heavy machinery, perform activities at  heights, swim, participate in water activities or provide baby-sitting services while on medications for pain, sleep and mood until your outpatient physician has reevaluated you and advised to do so again.  You are strongly recommended to comply with the dose, frequency and duration of prescribed medications. Activity: As tolerated with Full fall precautions use walker/cane & assistance as needed Avoid using any recreational substances like cigarette, tobacco, alcohol, or non-prescribed drug. If you experience worsening of your admission symptoms, develop shortness of breath, life threatening emergency, suicidal or homicidal thoughts you must seek medical attention immediately by calling 911 or calling your MD immediately  if symptoms less severe. You must read complete instructions/literature along with all the possible adverse reactions/side effects for all the medicines you take and that have been prescribed to you. Take any new medicine only after you have completely understood and accepted all the possible adverse reactions/side effects.  Wear Seat belts while driving. You were cared for by a hospitalist during your hospital stay. If you have any questions about your discharge medications or the care you received while you were in the hospital after you are discharged, you can call the unit and ask to speak with the hospitalist or the covering physician. Once you are discharged, your primary care physician will handle any further medical issues. Please note that NO REFILLS for any discharge medications will be authorized once you are discharged, as it is imperative that you return to your primary care physician (or establish a relationship with a primary care physician if you do not have one).   Discharge wound care:   Complete by: As directed    Increase activity slowly   Complete by: As directed        Discharge Medications:   Allergies as of 01/12/2022       Reactions   Contrast Media  [iodinated Contrast Media] Rash        Medication List     STOP taking these medications    famotidine 20 MG tablet Commonly known as: PEPCID   predniSONE 50 MG tablet Commonly known as: DELTASONE       TAKE these medications    acetaminophen 650 MG CR tablet Commonly known as: TYLENOL Take 650 mg by mouth every 8 (eight) hours as needed for pain.   amoxicillin-clavulanate 875-125 MG tablet Commonly known as: AUGMENTIN Take 1  tablet by mouth 2 (two) times daily for 10 days.   Comirnaty syringe Generic drug: COVID-19 mRNA vaccine 2023-2024 Inject into the muscle.   docusate sodium 100 MG capsule Commonly known as: COLACE Take 200 mg by mouth 2 (two) times daily.   dorzolamide 2 % ophthalmic solution Commonly known as: TRUSOPT Instill 1 drop into right eye three times daily.   hydrocortisone 10 MG tablet Commonly known as: CORTEF Take 2 tablets (20 mg total) by mouth in the morning AND 1 tablet (10 mg total) every evening.   ibuprofen 200 MG tablet Commonly known as: ADVIL Take 200 mg by mouth every 8 (eight) hours as needed for fever, headache, mild pain, moderate pain or cramping.   Lenvima (18 MG Daily Dose) 10 MG & 2 x 4 MG capsule Generic drug: lenvatinib 18 mg daily dose Take 18 mg by mouth daily.   levETIRAcetam 250 MG tablet Commonly known as: KEPPRA Take 250 mg by mouth 2 (two) times daily.   lidocaine-prilocaine cream Commonly known as: EMLA APPLY TO PORTACATH 1 HOUR PRIOR TO USE AS NEEDED   losartan-hydrochlorothiazide 100-12.5 MG tablet Commonly known as: HYZAAR Take 1 tablet by mouth once a day.   mirtazapine 30 MG tablet Commonly known as: REMERON Take 1 tablet (30 mg total) by mouth at bedtime.   MULTIVITAMIN ADULT PO Take 1 tablet by mouth daily. [place] product   ondansetron 8 MG tablet Commonly known as: ZOFRAN TAKE 1 TABLET BY MOUTH EVERY 8 HOURS AS NEEDED FOR NAUSEA AND VOMITING What changed:  how much to take how to take  this when to take this reasons to take this   oxyCODONE 5 MG immediate release tablet Commonly known as: Oxy IR/ROXICODONE Take 1 tablet by mouth every 6 hours as needed for severe pain. What changed: Another medication with the same name was changed. Make sure you understand how and when to take each.   OxyCONTIN 10 mg 12 hr tablet Generic drug: oxyCODONE TAKE 2 TABLETS BY MOUTH EVERY 12 HOURS. What changed: how much to take   polyethylene glycol 17 g packet Commonly known as: MIRALAX / GLYCOLAX Take 17 g by mouth daily.   prochlorperazine 10 MG tablet Commonly known as: COMPAZINE Take 1 tablet by mouth every 6 hours as needed for nausea or vomiting.   saccharomyces boulardii 250 MG capsule Commonly known as: FLORASTOR Take 1 capsule (250 mg total) by mouth 2 (two) times daily for 10 days.   Testosterone 20.25 MG/ACT (1.62%) Gel Apply 2 pumps topically daily What changed:  how much to take how to take this when to take this   Vitamin B12 1000 MCG Tbcr Take 1,000 mcg by mouth daily.   Vitamin D 50 MCG (2000 UT) Caps Take 2,000 Units by mouth daily.               Discharge Care Instructions  (From admission, onward)           Start     Ordered   01/12/22 0000  Discharge wound care:        01/12/22 1352             The results of significant diagnostics from this hospitalization (including imaging, microbiology, ancillary and laboratory) are listed below for reference.    Procedures and Diagnostic Studies:   IR Perc Cholecystostomy  Result Date: 01/09/2022 INDICATION: 76 year old male referred for percutaneous cholecystostomy EXAM: CHOLECYSTOSTOMY MEDICATIONS: None ANESTHESIA/SEDATION: Moderate (conscious) sedation was employed during this procedure. A  total of Versed 0.5 mg and Fentanyl 100 mcg was administered intravenously. Moderate Sedation Time: 22 minutes. The patient's level of consciousness and vital signs were monitored continuously by  radiology nursing throughout the procedure under my direct supervision. FLUOROSCOPY TIME:  Fluoroscopy Time: 1 minutes 6 seconds (19 mGy). COMPLICATIONS: None PROCEDURE: Informed written consent was obtained from the patient and the patient's family after a thorough discussion of the procedural risks, benefits and alternatives. All questions were addressed. Maximal Sterile Barrier Technique was utilized including caps, mask, sterile gowns, sterile gloves, sterile drape, hand hygiene and skin antiseptic. A timeout was performed prior to the initiation of the procedure. Ultrasound survey of the right upper quadrant was performed for planning purposes. Once the patient is prepped and draped in the usual sterile fashion, the skin and subcutaneous tissues overlying the gallbladder were generously infiltrated 1% lidocaine for local anesthesia. A coaxial needle was advanced under ultrasound guidance through the skin subcutaneous tissues and a small segment of liver into the gallbladder lumen. With removal of the stylet, spontaneous dark bile drainage occurred. Using modified Seldinger technique, a 10 French drain was placed into the gallbladder fossa, with aspiration of the sample for the lab. Contrast injection confirmed position of the tube within the gallbladder lumen. Drainage catheter was attached to gravity drain with a suture retention placed. Patient tolerated the procedure well and remained hemodynamically stable throughout. No complications were encountered and no significant blood loss encountered. IMPRESSION: Status post percutaneous cholecystostomy Signed, Dulcy Fanny. Nadene Rubins, RPVI [facility] Radiology Electronically Signed   By: Corrie Mckusick D.O.   On: 01/09/2022 14:10   US Abdomen Limited RUQ (LIVER/GB)  Result Date: 01/08/2022 CLINICAL DATA:  Right upper quadrant abdominal pain. EXAM: ULTRASOUND ABDOMEN LIMITED RIGHT UPPER QUADRANT COMPARISON:   CT of the abdomen pelvis 01/08/2022 FINDINGS: Gallbladder: Gallbladder wall thickening measures up to 6 mm. Multiple shadowing stones are present in the neck of the gallbladder. No sonographic Percell Miller sign was reported. Common bile duct: Diameter: 4.0 mm, within normal limits Liver: Liver is diffusely echogenic. No discrete lesions are present. Portal vein is patent on color Doppler imaging with normal direction of blood flow towards the liver. Other: None. IMPRESSION: 1. Cholelithiasis with gallbladder wall thickening. Findings are equivocal for acute cholecystitis. 2. Fatty infiltration of the liver. Electronically Signed   By: San Morelle M.D.   On: 01/08/2022 12:04   CT Chest Wo Contrast  Result Date: 01/08/2022 CLINICAL DATA:  History of renal cell carcinoma currently on treatment increased weakness, abdominal pain, nausea and vomiting EXAM: CT CHEST, ABDOMEN AND PELVIS WITHOUT CONTRAST TECHNIQUE: Multidetector CT imaging of the chest, abdomen and pelvis was performed following the standard protocol without IV contrast. RADIATION DOSE REDUCTION: This exam was performed according to the departmental dose-optimization program which includes automated exposure control, adjustment of the mA and/or kV according to patient size and/or use of iterative reconstruction technique. COMPARISON:  CT chest, abdomen, and pelvis dated 11/10/2021 FINDINGS: CT CHEST FINDINGS Cardiovascular: Right chest wall port tip terminates in the lower SVC. Normal heart size. No significant pericardial fluid/thickening. Coronary artery calcifications. Great vessels are normal in course and caliber. Aortic atherosclerosis. Mediastinum/Nodes: thyroid gland without nodules meeting criteria for imaging follow-up by size. Normal esophagus. No pathologically enlarged axillary, supraclavicular, mediastinal, or hilar lymph nodes. Lungs/Pleura: There is mild respiratory motion artifact. The central airways are patent. No focal  consolidation. Decreased conspicuity of previously noted right upper lobe nodules measuring average 4  mm (6:41) and 3 mm (6:65). No definite new or enlarging pulmonary nodules. No pneumothorax. No pleural effusion. Musculoskeletal: Similar multifocal osseous metastases, most notably mixed lytic and sclerotic appearance of T1 and T9 and similar expansile lytic lesion in right T11 costovertebral junction. Slightly increased sclerotic component of the left 4th rib (5:187). CT ABDOMEN PELVIS FINDINGS Hepatobiliary: Similar segment 4 cyst (2:53). Previously noted segment 3 cystic focus is not well seen. No new focal hepatic lesions. No intra or extrahepatic biliary ductal dilation. Distended gallbladder containing gallstones. Similar gallbladder fundal adenomyomatosis. New mild pericholecystic stranding. Pancreas: No pancreatic ductal dilatation or surrounding inflammatory changes. Spleen: Normal in size without focal abnormality. Adrenals/Urinary Tract: No adrenal nodules. No substantial change in size of treated left upper pole lesion measuring 2.2 x 2.0 cm (2:67). Unchanged subcentimeter exophytic focus along the anterior lower pole left kidney (2:81). No new or enlarging renal masses. No calculi or hydronephrosis. No focal bladder wall thickening. Stomach/Bowel: Normal appearance of the stomach. No evidence of bowel wall thickening, distention, or inflammatory changes. Normal appendix. Vascular/Lymphatic: No significant vascular findings are present. No enlarged abdominal or pelvic lymph nodes. Reproductive: Enlarged prostate gland. Other: Increased small volume free fluid.  No fluid collection. Musculoskeletal: Multifocal osseous metastases with increased soft tissue component of a lytic left acetabular metastasis, for example measuring 3.9 x 2.4 cm inferiorly (2:126), previously 3.2 x 2.2 cm. Unchanged compression of L1 and L4. IMPRESSION: 1. Distended gallbladder containing gallstones with new mild pericholecystic  stranding, which may reflect acute cholecystitis. Recommend correlation with right upper quadrant ultrasound for further evaluation. 2. Increased small volume free fluid in the abdomen and pelvis. 3. Multifocal osseous metastases with increased soft tissue component of a lytic left acetabular metastasis and increased sclerotic component of a left 4th rib metastasis. 4. Decreased conspicuity of previously noted right upper lobe pulmonary nodules. No definite new or enlarging pulmonary nodules. 5. No substantial change in size of treated left upper pole renal lesion. 6. Aortic atherosclerosis. Aortic Atherosclerosis (ICD10-I70.0). Electronically Signed   By: Darrin Nipper M.D.   On: 01/08/2022 11:04   CT ABDOMEN PELVIS WO CONTRAST  Result Date: 01/08/2022 CLINICAL DATA:  History of renal cell carcinoma currently on treatment increased weakness, abdominal pain, nausea and vomiting EXAM: CT CHEST, ABDOMEN AND PELVIS WITHOUT CONTRAST TECHNIQUE: Multidetector CT imaging of the chest, abdomen and pelvis was performed following the standard protocol without IV contrast. RADIATION DOSE REDUCTION: This exam was performed according to the departmental dose-optimization program which includes automated exposure control, adjustment of the mA and/or kV according to patient size and/or use of iterative reconstruction technique. COMPARISON:  CT chest, abdomen, and pelvis dated 11/10/2021 FINDINGS: CT CHEST FINDINGS Cardiovascular: Right chest wall port tip terminates in the lower SVC. Normal heart size. No significant pericardial fluid/thickening. Coronary artery calcifications. Great vessels are normal in course and caliber. Aortic atherosclerosis. Mediastinum/Nodes: thyroid gland without nodules meeting criteria for imaging follow-up by size. Normal esophagus. No pathologically enlarged axillary, supraclavicular, mediastinal, or hilar lymph nodes. Lungs/Pleura: There is mild respiratory motion artifact. The central airways are  patent. No focal consolidation. Decreased conspicuity of previously noted right upper lobe nodules measuring average 4 mm (6:41) and 3 mm (6:65). No definite new or enlarging pulmonary nodules. No pneumothorax. No pleural effusion. Musculoskeletal: Similar multifocal osseous metastases, most notably mixed lytic and sclerotic appearance of T1 and T9 and similar expansile lytic lesion in right T11 costovertebral junction. Slightly increased sclerotic component of the left 4th rib (5:187). CT ABDOMEN  PELVIS FINDINGS Hepatobiliary: Similar segment 4 cyst (2:53). Previously noted segment 3 cystic focus is not well seen. No new focal hepatic lesions. No intra or extrahepatic biliary ductal dilation. Distended gallbladder containing gallstones. Similar gallbladder fundal adenomyomatosis. New mild pericholecystic stranding. Pancreas: No pancreatic ductal dilatation or surrounding inflammatory changes. Spleen: Normal in size without focal abnormality. Adrenals/Urinary Tract: No adrenal nodules. No substantial change in size of treated left upper pole lesion measuring 2.2 x 2.0 cm (2:67). Unchanged subcentimeter exophytic focus along the anterior lower pole left kidney (2:81). No new or enlarging renal masses. No calculi or hydronephrosis. No focal bladder wall thickening. Stomach/Bowel: Normal appearance of the stomach. No evidence of bowel wall thickening, distention, or inflammatory changes. Normal appendix. Vascular/Lymphatic: No significant vascular findings are present. No enlarged abdominal or pelvic lymph nodes. Reproductive: Enlarged prostate gland. Other: Increased small volume free fluid.  No fluid collection. Musculoskeletal: Multifocal osseous metastases with increased soft tissue component of a lytic left acetabular metastasis, for example measuring 3.9 x 2.4 cm inferiorly (2:126), previously 3.2 x 2.2 cm. Unchanged compression of L1 and L4. IMPRESSION: 1. Distended gallbladder containing gallstones with new  mild pericholecystic stranding, which may reflect acute cholecystitis. Recommend correlation with right upper quadrant ultrasound for further evaluation. 2. Increased small volume free fluid in the abdomen and pelvis. 3. Multifocal osseous metastases with increased soft tissue component of a lytic left acetabular metastasis and increased sclerotic component of a left 4th rib metastasis. 4. Decreased conspicuity of previously noted right upper lobe pulmonary nodules. No definite new or enlarging pulmonary nodules. 5. No substantial change in size of treated left upper pole renal lesion. 6. Aortic atherosclerosis. Aortic Atherosclerosis (ICD10-I70.0). Electronically Signed   By: Darrin Nipper M.D.   On: 01/08/2022 11:04   CT Head Wo Contrast  Result Date: 01/08/2022 CLINICAL DATA:  Worsening weakness.  Known brain metastases. EXAM: CT HEAD WITHOUT CONTRAST TECHNIQUE: Contiguous axial images were obtained from the base of the skull through the vertex without intravenous contrast. RADIATION DOSE REDUCTION: This exam was performed according to the departmental dose-optimization program which includes automated exposure control, adjustment of the mA and/or kV according to patient size and/or use of iterative reconstruction technique. COMPARISON:  Head MRI 01/02/2022 FINDINGS: Brain: There is no evidence of an acute infarct, acute intracranial hemorrhage, midline shift, or extra-axial fluid collection. Two known small metastases in the medial left frontal lobe are partially calcified. Mild-to-moderate left frontal lobe edema is similar to the prior MRI. Smaller areas of edema or gliosis in the left temporal and left occipital lobes are unchanged. 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 There is a background of mild chronic small vessel ischemia elsewhere in the cerebral white matter. Vascular: Calcified atherosclerosis at the skull base. No hyperdense vessel. Skull: No fracture or suspicious  osseous lesion. Sinuses/Orbits: Mild bilateral ethmoid air cell mucosal thickening. Clear mastoid air cells. Unremarkable orbits. Other: None. IMPRESSION: Known brain metastases without evidence of worsening edema, acute hemorrhage, or other acute intracranial abnormality. Electronically Signed   By: Logan Bores M.D.   On: 01/08/2022 10:36   DG Chest Port 1 View  Result Date: 01/08/2022 CLINICAL DATA:  76 year old male with history of hypoxia and weakness. History of renal cell carcinoma. EXAM: PORTABLE CHEST 1 VIEW COMPARISON:  No prior chest x-ray.  Chest CT 11/10/2021. FINDINGS: Right-sided internal jugular single-lumen Port-A-Cath with tip terminating at the superior cavoatrial junction. Lung volumes are normal. No consolidative airspace disease. No pleural effusions.  No pneumothorax. No pulmonary nodule or mass noted. Pulmonary vasculature and the cardiomediastinal silhouette are within normal limits. Atherosclerotic calcifications in the thoracic aorta. IMPRESSION: 1. No radiographic evidence of acute cardiopulmonary disease. No definite signs of metastatic disease in the thorax. Electronically Signed   By: Vinnie Langton M.D.   On: 01/08/2022 09:38     Labs:   Basic Metabolic Panel: Recent Labs  Lab 01/08/22 1210 01/09/22 0333 01/10/22 0307 01/12/22 0422  NA 137 134* 137 141  K 4.5 3.5 3.6 3.0*  CL 96* 96* 100 105  CO2 '25 25 27 30  '$ GLUCOSE 156* 164* 199* 109*  BUN 18 26* 31* 17  CREATININE 1.50* 2.81* 1.92* 0.96  CALCIUM 8.4* 8.1* 7.7* 8.6*  MG  --   --  1.7 2.1  PHOS  --   --  2.2*  --    GFR Estimated Creatinine Clearance: 82.5 mL/min (by C-G formula based on SCr of 0.96 mg/dL). Liver Function Tests: Recent Labs  Lab 01/08/22 1210 01/09/22 0333 01/10/22 0307  AST '29 16 20  '$ ALT '13 12 14  '$ ALKPHOS 79 82 56  BILITOT 1.6* 0.8 0.4  PROT 6.9 5.9* 5.6*  ALBUMIN 2.9* 2.4* 2.2*   Recent Labs  Lab 01/08/22 1210  LIPASE 22   No results for input(s): "AMMONIA" in the  last 168 hours. Coagulation profile Recent Labs  Lab 01/08/22 1522 01/09/22 0333  INR 1.3* 1.3*    CBC: Recent Labs  Lab 01/08/22 1210 01/09/22 0333 01/10/22 0307 01/12/22 0422  WBC 16.5* 15.9* 11.0* 7.9  NEUTROABS 10.2*  --   --  4.7  HGB 13.2 10.5* 10.1* 9.5*  HCT 42.6 34.6* 33.0* 32.3*  MCV 84.5 85.4 84.6 87.1  PLT 357 266 285 239   Cardiac Enzymes: No results for input(s): "CKTOTAL", "CKMB", "CKMBINDEX", "TROPONINI" in the last 168 hours. BNP: Invalid input(s): "POCBNP" CBG: Recent Labs  Lab 01/08/22 0922  GLUCAP 167*   D-Dimer No results for input(s): "DDIMER" in the last 72 hours. Hgb A1c No results for input(s): "HGBA1C" in the last 72 hours. Lipid Profile No results for input(s): "CHOL", "HDL", "LDLCALC", "TRIG", "CHOLHDL", "LDLDIRECT" in the last 72 hours. Thyroid function studies No results for input(s): "TSH", "T4TOTAL", "T3FREE", "THYROIDAB" in the last 72 hours.  Invalid input(s): "FREET3" Anemia work up No results for input(s): "VITAMINB12", "FOLATE", "FERRITIN", "TIBC", "IRON", "RETICCTPCT" in the last 72 hours. Microbiology Recent Results (from the past 240 hour(s))  Blood culture (routine x 2)     Status: None (Preliminary result)   Collection Time: 01/08/22  5:08 PM   Specimen: BLOOD LEFT ARM  Result Value Ref Range Status   Specimen Description BLOOD LEFT ARM  Final   Special Requests   Final    BOTTLES DRAWN AEROBIC AND ANAEROBIC Blood Culture adequate volume   Culture   Final    NO GROWTH 4 DAYS Performed at [facility], [address], [place], [place] [address]    Report Status PENDING  Incomplete  Blood culture (routine x 2)     Status: None (Preliminary result)   Collection Time: 01/08/22  5:14 PM   Specimen: BLOOD  Result Value Ref Range Status   Specimen Description   Final    BLOOD SITE NOT SPECIFIED Performed at [facility] [address], [place], [place] [address]    Special Requests   Final     BOTTLES DRAWN AEROBIC AND ANAEROBIC Blood Culture adequate volume Performed at [facility]  [place] [address] [place], [place] [address]    Culture   Final    NO GROWTH 4 DAYS Performed at [facility], [place] [address] [place], [place] [address]    Report Status PENDING  Incomplete  Aerobic/Anaerobic Culture w Gram Stain (surgical/deep wound)     Status: None (Preliminary result)   Collection Time: 01/09/22  1:58 PM   Specimen: Gallbladder; Bile  Result Value Ref Range Status   Specimen Description   Final    GALL BLADDER Performed at [facility] [address], [place], [place] [address]    Special Requests   Final    NONE Performed at [facility], [place] [address], [place], [place] [address]    Gram Stain   Final    FEW WBC PRESENT,BOTH PMN AND MONONUCLEAR NO ORGANISMS SEEN    Culture   Final    NO GROWTH 3 DAYS NO ANAEROBES ISOLATED; CULTURE IN PROGRESS FOR 5 DAYS Performed at [facility], [place] [address] [place],  [address]    Report Status PENDING  Incomplete    Time coordinating discharge: 35 minutes  Signed: Inis Borneman  [facility] 01/12/2022, 1:52 PM

## 2022-01-12 NOTE — Telephone Encounter (Signed)
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    Lamoine is still recovering from the gallbladder drain tube and now worried about having to wear a cardiac monitor after a brief pause in his heart rhythm. LGuerry Minorsfeels that he needs a little time to recover and process before getting any radiation treatment set up. They would like to see Dr. VMickeal Skinneronce discharged and discuss timing of next MRI and possible treatment at that visit   SMont DuttonR.T.(R)(T) Radiation Special Procedures Navigator

## 2022-01-12 NOTE — Progress Notes (Signed)
Physical Therapy Treatment Patient Details Name: Phillip [name] MRN: [id number] DOB: Dec 25, 1945 Today's Date: 01/12/2022   History of Present Illness Patient is a 76 year old male who presented with confusion and abdominal pain. Patient was found to have acute cholecystitis, sepsis, AKI, and acute metabolic encephalopathy. S/P perc drain placement by IR 11/14. 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    PT Comments    Pt AxO x 3 pleasant and motivated.  Spouse who is a Therapist, sports in room.  Pt already OOB in recliner.  Secured R drain.  Assisted with amb in hallway went well.  Required increased time.  General Gait Details: Slow but steady with RW use. Min guard for safety. Pt tolerated distance well. Denied dizziness. Spouse present and assisted with following with recliner. Pt plans to return home with spouse when medically stable.    Recommendations for follow up therapy are one component of a multi-disciplinary discharge planning process, led by the attending physician.  Recommendations may be updated based on patient status, additional functional criteria and insurance authorization.  Follow Up Recommendations  Home health PT     Assistance Recommended at Discharge Frequent or constant Supervision/Assistance  Patient can return home with the following Assist for transportation;Help with stairs or ramp for entrance;Assistance with cooking/housework;A lot of help with bathing/dressing/bathroom;A lot of help with walking and/or transfers   Equipment Recommendations  None recommended by PT    Recommendations for Other Services       Precautions / Restrictions Precautions Precautions: Fall Precaution Comments: R side drain Restrictions Weight Bearing Restrictions: No     Mobility  Bed Mobility               General bed mobility comments: OOB in recliner    Transfers Overall transfer level: Needs assistance Equipment used: Rolling walker (2  wheels) Transfers: Sit to/from Stand Sit to Stand: Mod assist, Max assist           General transfer comment: spouse stated he has a LIFT CHAIR at home.  Required increased asisst to rise.    Ambulation/Gait Ambulation/Gait assistance: Supervision, Min guard Gait Distance (Feet): 115 Feet Assistive device: Rolling walker (2 wheels) Gait Pattern/deviations: Step-through pattern, Decreased stride length, Trunk flexed Gait velocity: decreased     General Gait Details: Slow but steady with RW use. Min guard for safety. Pt tolerated distance well. Denied dizziness. Spouse present and assisted with following with recliner.   Stairs             Wheelchair Mobility    Modified Rankin (Stroke Patients Only)       Balance                                            Cognition Arousal/Alertness: Awake/alert Behavior During Therapy: WFL for tasks assessed/performed Overall Cognitive Status: Within Functional Limits for tasks assessed                                 General Comments: AxO x 3 very pleasant/cooperative        Exercises      General Comments        Pertinent Vitals/Pain Pain Assessment Pain Assessment: Faces Faces Pain Scale: Hurts a little bit Pain Location: R flank Pain Descriptors / Indicators: Grimacing,  Guarding Pain Intervention(s): Monitored during session    Home Living                          Prior Function            PT Goals (current goals can now be found in the care plan section) Progress towards PT goals: Progressing toward goals    Frequency    Min 3X/week      PT Plan Current plan remains appropriate    Co-evaluation              AM-PAC PT "6 Clicks" Mobility   Outcome Measure  Help needed turning from your back to your side while in a flat bed without using bedrails?: A Little Help needed moving from lying on your back to sitting on the side of a flat bed  without using bedrails?: A Little Help needed moving to and from a bed to a chair (including a wheelchair)?: A Little Help needed standing up from a chair using your arms (e.g., wheelchair or bedside chair)?: A Little Help needed to walk in hospital room?: A Little Help needed climbing 3-5 steps with a railing? : A Lot 6 Click Score: 17    End of Session Equipment Utilized During Treatment: Gait belt Activity Tolerance: Patient tolerated treatment well Patient left: in chair;with call bell/phone within reach;with chair alarm set;with family/visitor present Nurse Communication: Mobility status PT Visit Diagnosis: Muscle weakness (generalized) (M62.81);Difficulty in walking, not elsewhere classified (R26.2);Pain     Time: 4158-3094 PT Time Calculation (min) (ACUTE ONLY): 23 min  Charges:  $Gait Training: 23-37 mins                     {Tiahna Cure  PTA Acute  [employer] M-F          [phone] Weekend pager [phone]

## 2022-01-12 NOTE — Progress Notes (Signed)
Patient to be discharged to home this afternoon. Patient and Patient's Wife given discharge instructions/teaching including all Discharge Medications and schedules for these Medications. Understanding verbalized and discharge AVS with the Patient at time of discharge

## 2022-01-12 NOTE — Progress Notes (Addendum)
Rounding Note    Patient Name: Phillip [name] Date of Encounter: 01/12/2022  [place] Cardiologist: Werner Lean, MD   Subjective   Feels great today. Says he and wife would feel more comfortable if he wears a monitor at d/c  Inpatient Medications    Scheduled Meds:  Chlorhexidine Gluconate Cloth  6 each Topical Daily   dorzolamide  1 drop Right Eye TID   losartan  100 mg Oral Daily   And   hydrochlorothiazide  12.5 mg Oral Daily   hydrocortisone sod succinate (SOLU-CORTEF) inj  30 mg Intravenous QAC breakfast   levETIRAcetam  250 mg Oral BID   oxyCODONE  20 mg Oral Q12H   sodium chloride flush  10-40 mL Intracatheter Q12H   sodium chloride flush  5 mL Intracatheter Q8H   Continuous Infusions:  piperacillin-tazobactam (ZOSYN)  IV 3.375 g (01/12/22 0524)   PRN Meds: acetaminophen **OR** acetaminophen, hydrALAZINE, morphine injection, ondansetron **OR** ondansetron (ZOFRAN) IV, oxyCODONE   Vital Signs    Vitals:   01/11/22 0414 01/11/22 0704 01/11/22 1233 01/12/22 0515  BP: (!) 172/73 (!) 167/47 (!) 161/74 (!) 168/85  Pulse: 62 (!) 58 73 67  Resp:   20 18  Temp:   97.6 F (36.4 C) 99.4 F (37.4 C)  TempSrc:   Oral Oral  SpO2: 97%  94% 93%  Weight:      Height:        Intake/Output Summary (Last 24 hours) at 01/12/2022 0933 Last data filed at 01/11/2022 1500 Gross per 24 hour  Intake 200 ml  Output 650 ml  Net -450 ml      01/08/2022    9:24 AM 12/27/2021   10:31 AM 12/05/2021    9:55 AM  Last 3 Weights  Weight (lbs) 250 lb 248 lb 253 lb  Weight (kg) 113.399 kg 112.492 kg 114.76 kg      Telemetry    SR, ST, no pauses, freq PACs - Personally Reviewed  ECG    pending - Personally Reviewed  Physical Exam   GEN: No acute distress.   Neck: No JVD Cardiac: RRR, no murmurs, rubs, or gallops.  Respiratory: Clear to auscultation bilaterally. GI: Soft, nontender, non-distended  MS: No edema; No deformity. Neuro:   Nonfocal  Psych: Normal affect   Labs    High Sensitivity Troponin:  No results for input(s): "TROPONINIHS" in the last 720 hours.   Chemistry Recent Labs  Lab 01/08/22 1210 01/09/22 0333 01/10/22 0307 01/12/22 0422  NA 137 134* 137 141  K 4.5 3.5 3.6 3.0*  CL 96* 96* 100 105  CO2 '25 25 27 30  '$ GLUCOSE 156* 164* 199* 109*  BUN 18 26* 31* 17  CREATININE 1.50* 2.81* 1.92* 0.96  CALCIUM 8.4* 8.1* 7.7* 8.6*  MG  --   --  1.7 2.1  PROT 6.9 5.9* 5.6*  --   ALBUMIN 2.9* 2.4* 2.2*  --   AST '29 16 20  '$ --   ALT '13 12 14  '$ --   ALKPHOS 79 82 56  --   BILITOT 1.6* 0.8 0.4  --   GFRNONAA 48* 22* 35* >60  ANIONGAP 16* '13 10 6    '$ Lipids No results for input(s): "CHOL", "TRIG", "HDL", "LABVLDL", "LDLCALC", "CHOLHDL" in the last 168 hours.  Hematology Recent Labs  Lab 01/09/22 0333 01/10/22 0307 01/12/22 0422  WBC 15.9* 11.0* 7.9  RBC 4.05* 3.90* 3.71*  HGB 10.5* 10.1* 9.5*  HCT 34.6* 33.0* 32.3*  MCV 85.4 84.6 87.1  MCH 25.9* 25.9* 25.6*  MCHC 30.3 30.6 29.4*  RDW 17.2* 17.5* 17.6*  PLT 266 285 239   Thyroid No results for input(s): "TSH", "FREET4" in the last 168 hours.  BNPNo results for input(s): "BNP", "PROBNP" in the last 168 hours.  DDimer No results for input(s): "DDIMER" in the last 168 hours.   Radiology    No results found.  Cardiac Studies   ECHO: ordered  Patient Profile     76 y.o. male with a hx of renal cell CA w/ mets brain, Sz, HTN was admitted 11/13 with sepsis 2nd cholecystitis, s/p perc drain, on IV ABX, acute metabolic encephalopathy 2nd acute infection, AKI.   11/16, he had a 4.09 sec pause, Cards asked to see.    Assessment & Plan    Pause - asymptomatic, in SR, no p waves seen, baseline HR did not drop - ECG to check for long QT pending - no hx OSA, believe he was awake at the time but it was early, could have been dozing - since he is on chemo w/ Keytruda and lenvatinib, echo ordered, not done yet - monitor at d/c, MD advise duration -  will arrange f/u - no PPM possible at this time due to ongoing infection - final decision on PPM to be made as outpt once all data reviewed  2. RCC w/ brain mets and other issues, per Dr Pietro Cassis  3. Hypokalemia - K+ 3.0 today, has had IVF, that may have dropped it - spoke w/ Dr Pietro Cassis, will give a total of 80 meq Kdur this am - losartan/HCTZ 100/12.5 has been restarted this am, may need daily supplement, will leave to IM    For questions or updates, please contact [facility] Please consult [email or web address] for contact info under        Signed, Rosaria Ferries, PA-C  01/12/2022, 9:33 AM     Personally seen and examined. Agree with APP above with the following comments:  Briefly 76 yo M with metastatin cancer on immunotherapy with asymptomatic 4.09 s pause No further pauses. No further symptoms (did not have sx during the pause) Heart rate regular. We have discussed that while he is on ABX he is not a PPM candidate; I am unclear of his 1 year mortality but I do not suspect he is a PPM candidate in the future; if this is an issues we would reach out to [place] first. Wife notes that he had bradycardia with timolol eye drips.  He is no long on these.  I suspect the Beryle Flock is not the culprit and would not stop given limited options.   [place] will sign off.   Medication Recommendations:  NA Other recommendations (labs, testing, etc):  7 day live monitor will arrive at his house Monday; continue telemetry while here Follow up as an outpatient:  needs outpatient f/u set up  Rudean Haskell, MD [facility] [place], [place] [address] [phone]  12:45 PM

## 2022-01-12 NOTE — Progress Notes (Signed)
Mobility Specialist - Progress Note   01/12/22 1023  Mobility  Activity Transferred from bed to chair  Level of Assistance Moderate assist, patient does 50-74%  Assistive Device Front wheel walker  Range of Motion/Exercises Active  Activity Response Tolerated well  Mobility Referral Yes  $Mobility charge 1 Mobility   Pt was found in bed and agreeable to transfer to the recliner chair. Pt needed mod-A when going from lying to sitting EOB. Pt also needed assistance with moving his R leg to EOB. At EOS he was left on the recliner chair with necessities in reach and both RN and NT notified.   Ferd Hibbs Mobility Specialist

## 2022-01-12 NOTE — Progress Notes (Signed)
Referring Physician(s): Blackman,D  Supervising Physician: Daryll Brod  Patient Status:  [facility] - In-pt  Chief Complaint:  Abdominal pain/cholecystitis   Subjective: Pt sitting in chair, resting quietly; no acute c/o at present; wife in room   Allergies: Contrast media [iodinated contrast media]  Medications: Prior to Admission medications   Medication Sig Start Date End Date Taking? Authorizing Provider  acetaminophen (TYLENOL) 650 MG CR tablet Take 650 mg by mouth every 8 (eight) hours as needed for pain.   Yes [provider]  Cholecalciferol (VITAMIN D) 50 MCG (2000 UT) CAPS Take 2,000 Units by mouth daily.   Yes [provider]  Cyanocobalamin (VITAMIN B12) 1000 MCG TBCR Take 1,000 mcg by mouth daily.   Yes [provider]  docusate sodium (COLACE) 100 MG capsule Take 200 mg by mouth 2 (two) times daily.   Yes [provider]  dorzolamide (TRUSOPT) 2 % ophthalmic solution Instill 1 drop into right eye three times daily. 10/27/21  Yes   famotidine (PEPCID) 20 MG tablet Take 20 mg by mouth 2 (two) times daily.   Yes [provider]  hydrocortisone (CORTEF) 10 MG tablet Take 2 tablets (20 mg total) by mouth in the morning AND 1 tablet (10 mg total) every evening. 12/29/21  Yes [name], MD  ibuprofen (ADVIL,MOTRIN) 200 MG tablet Take 200 mg by mouth every 8 (eight) hours as needed for fever, headache, mild pain, moderate pain or cramping.    Yes [provider]  lenvatinib 18 mg daily dose (LENVIMA, 18 MG DAILY DOSE,) 10 MG & 2 x 4 MG capsule Take 18 mg by mouth daily. 11/30/21  Yes [name], MD  levETIRAcetam (KEPPRA) 250 MG tablet Take 250 mg by mouth 2 (two) times daily.   Yes [provider]  lidocaine-prilocaine (EMLA) cream APPLY TO PORTACATH 1 HOUR PRIOR TO USE AS NEEDED 11/13/21  Yes [name], MD  losartan-hydrochlorothiazide (HYZAAR) 100-12.5 MG tablet Take 1 tablet by mouth once a day.  12/29/21  Yes   mirtazapine (REMERON) 30 MG tablet Take 1 tablet (30 mg total) by mouth at bedtime. 12/29/21  Yes [name], MD  Multiple Vitamin (MULTIVITAMIN ADULT PO) Take 1 tablet by mouth daily. [place] product   Yes [provider]  ondansetron (ZOFRAN) 8 MG tablet TAKE 1 TABLET BY MOUTH EVERY 8 HOURS AS NEEDED FOR NAUSEA AND VOMITING Patient taking differently: Take 8 mg by mouth every 8 (eight) hours as needed for nausea or vomiting. 11/03/21  Yes Owens Shark, NP  oxyCODONE (OXY IR/ROXICODONE) 5 MG immediate release tablet Take 1 tablet by mouth every 6 hours as needed for severe pain. 12/28/21  Yes Owens Shark, NP  oxyCODONE (OXYCONTIN) 10 mg 12 hr tablet TAKE 2 TABLETS BY MOUTH EVERY 12 HOURS. Patient taking differently: Take 20 mg by mouth every 12 (twelve) hours. 12/28/21  Yes Owens Shark, NP  polyethylene glycol (MIRALAX / GLYCOLAX) 17 g packet Take 17 g by mouth daily.   Yes [provider]  prochlorperazine (COMPAZINE) 10 MG tablet Take 1 tablet by mouth every 6 hours as needed for nausea or vomiting. 01/04/21  Yes Owens Shark, NP  Testosterone 1.62 % GEL Apply 2 pumps topically daily Patient taking differently: Apply 2 Pump topically 2 (two) times a week. 07/04/21  Yes Owens Shark, NP  COVID-19 mRNA vaccine [phone] (COMIRNATY) syringe Inject into the muscle. 12/27/21   Carlyle Basques, MD  predniSONE (  DELTASONE) 50 MG tablet Take 1  tablet by mouth 13 hours, 7 hours, and 1 hour prior to CT scan. Patient not taking: Reported on 11/13/2021 11/06/21   [name], MD     Vital Signs: BP (!) 168/85 (BP Location: Right Arm)   Pulse 67   Temp 99.4 F (37.4 C) (Oral)   Resp 18   Ht 5' 11" (1.803 m)   Wt 250 lb (113.4 kg)   SpO2 93%   BMI 34.87 kg/m   Physical Exam awake/alert; RUQ/GB drain intact, insertion site ok, not sig tender, OP 40 cc reddish-yellow fluid; drain flushed without difficulty  Imaging: IR Perc Cholecystostomy  Result  Date: 01/09/2022 INDICATION: 76 year old male referred for percutaneous cholecystostomy EXAM: CHOLECYSTOSTOMY MEDICATIONS: None ANESTHESIA/SEDATION: Moderate (conscious) sedation was employed during this procedure. A total of Versed 0.5 mg and Fentanyl 100 mcg was administered intravenously. Moderate Sedation Time: 22 minutes. The patient's level of consciousness and vital signs were monitored continuously by radiology nursing throughout the procedure under my direct supervision. FLUOROSCOPY TIME:  Fluoroscopy Time: 1 minutes 6 seconds (19 mGy). COMPLICATIONS: None PROCEDURE: Informed written consent was obtained from the patient and the patient's family after a thorough discussion of the procedural risks, benefits and alternatives. All questions were addressed. Maximal Sterile Barrier Technique was utilized including caps, mask, sterile gowns, sterile gloves, sterile drape, hand hygiene and skin antiseptic. A timeout was performed prior to the initiation of the procedure. Ultrasound survey of the right upper quadrant was performed for planning purposes. Once the patient is prepped and draped in the usual sterile fashion, the skin and subcutaneous tissues overlying the gallbladder were generously infiltrated 1% lidocaine for local anesthesia. A coaxial needle was advanced under ultrasound guidance through the skin subcutaneous tissues and a small segment of liver into the gallbladder lumen. With removal of the stylet, spontaneous dark bile drainage occurred. Using modified Seldinger technique, a 10 French drain was placed into the gallbladder fossa, with aspiration of the sample for the lab. Contrast injection confirmed position of the tube within the gallbladder lumen. Drainage catheter was attached to gravity drain with a suture retention placed. Patient tolerated the procedure well and remained hemodynamically stable throughout. No complications were encountered and no significant blood loss encountered.  IMPRESSION: Status post percutaneous cholecystostomy Signed, Dulcy Fanny. Nadene Rubins, RPVI [facility] Electronically Signed   By: Corrie Mckusick D.O.   On: 01/09/2022 14:10   US Abdomen Limited RUQ (LIVER/GB)  Result Date: 01/08/2022 CLINICAL DATA:  Right upper quadrant abdominal pain. EXAM: ULTRASOUND ABDOMEN LIMITED RIGHT UPPER QUADRANT COMPARISON:  CT of the abdomen pelvis 01/08/2022 FINDINGS: Gallbladder: Gallbladder wall thickening measures up to 6 mm. Multiple shadowing stones are present in the neck of the gallbladder. No sonographic Percell Miller sign was reported. Common bile duct: Diameter: 4.0 mm, within normal limits Liver: Liver is diffusely echogenic. No discrete lesions are present. Portal vein is patent on color Doppler imaging with normal direction of blood flow towards the liver. Other: None. IMPRESSION: 1. Cholelithiasis with gallbladder wall thickening. Findings are equivocal for acute cholecystitis. 2. Fatty infiltration of the liver. Electronically Signed   By: San Morelle M.D.   On: 01/08/2022 12:04    Labs:  CBC: Recent Labs    01/08/22 1210 01/09/22 0333 01/10/22 0307 01/12/22 0422  WBC 16.5* 15.9* 11.0* 7.9  HGB 13.2 10.5* 10.1* 9.5*  HCT 42.6 34.6* 33.0* 32.3*  PLT 357 266 285 239  COAGS: Recent Labs    01/08/22 1522 01/09/22 0333  INR 1.3* 1.3*    BMP: Recent Labs    01/08/22 1210 01/09/22 0333 01/10/22 0307 01/12/22 0422  NA 137 134* 137 141  K 4.5 3.5 3.6 3.0*  CL 96* 96* 100 105  CO2 _0 GLUCOSE 156* 164* 199* 109*  BUN 18 26* 31* 17  CALCIUM 8.4* 8.1* 7.7* 8.6*  CREATININE 1.50* 2.81* 1.92* 0.96  GFRNONAA 48* 22* 35* >60    LIVER FUNCTION TESTS: Recent Labs    12/27/21 1017 01/08/22 1210 01/09/22 0333 01/10/22 0307  BILITOT 0.2* 1.6* 0.8 0.4  AST 10* _1 ALT _2 ALKPHOS 83 79 82 56  PROT 6.6 6.9 5.9* 5.6*  ALBUMIN 3.7 2.9* 2.4* 2.2*     Assessment and Plan: Pt with hx acute calculous cholecystitis, met RCC; s/p perc GB drain 11/14 (10 fr to bag); afebrile ;WBC nl, hgb 9.5(10.1), creat nl; K 3.0- replace; bile cx- neg to date; cont drain for at least 4-6 weeks unless GB removal done in interim; cont drain flushes tid as IP, once daily as OP; monitor labs/cx; hydrate; antbx per primary team; f/u cholangiogram in 6 weeks or sooner if problems arise    Electronically Signed: D. Rowe Robert, PA-C 01/12/2022, 11:24 AM   I spent a total of 15 Minutes at the the patient's bedside AND on the patient's hospital floor or unit, greater than 50% of which was counseling/coordinating care for gallbladder drain    Patient ID: [name], male   DOB: 1945-08-05, 76 y.o.   MRN: [id number]

## 2022-01-12 NOTE — Progress Notes (Unsigned)
Enrolled patient for a 14 day Zio AT monitor to be mailed to patients home  Dr Gasper Sells to read

## 2022-01-12 NOTE — Progress Notes (Signed)
  Echocardiogram 2D Echocardiogram has been performed.  Phillip [name] 01/12/2022, 2:00 PM

## 2022-01-13 ENCOUNTER — Other Ambulatory Visit: Payer: Self-pay

## 2022-01-13 LAB — CULTURE, BLOOD (ROUTINE X 2)
Culture: NO GROWTH
Culture: NO GROWTH
Special Requests: ADEQUATE
Special Requests: ADEQUATE

## 2022-01-14 LAB — AEROBIC/ANAEROBIC CULTURE W GRAM STAIN (SURGICAL/DEEP WOUND): Culture: NO GROWTH

## 2022-01-16 ENCOUNTER — Other Ambulatory Visit (HOSPITAL_COMMUNITY): Payer: Self-pay

## 2022-01-16 ENCOUNTER — Other Ambulatory Visit: Payer: Self-pay | Admitting: Oncology

## 2022-01-16 DIAGNOSIS — I700 Atherosclerosis of aorta: Secondary | ICD-10-CM | POA: Diagnosis not present

## 2022-01-16 DIAGNOSIS — Z792 Long term (current) use of antibiotics: Secondary | ICD-10-CM | POA: Diagnosis not present

## 2022-01-16 DIAGNOSIS — K760 Fatty (change of) liver, not elsewhere classified: Secondary | ICD-10-CM | POA: Diagnosis not present

## 2022-01-16 DIAGNOSIS — E538 Deficiency of other specified B group vitamins: Secondary | ICD-10-CM | POA: Diagnosis not present

## 2022-01-16 DIAGNOSIS — K8000 Calculus of gallbladder with acute cholecystitis without obstruction: Secondary | ICD-10-CM | POA: Diagnosis not present

## 2022-01-16 DIAGNOSIS — E2740 Unspecified adrenocortical insufficiency: Secondary | ICD-10-CM | POA: Diagnosis not present

## 2022-01-16 DIAGNOSIS — C7951 Secondary malignant neoplasm of bone: Secondary | ICD-10-CM | POA: Diagnosis not present

## 2022-01-16 DIAGNOSIS — Z9181 History of falling: Secondary | ICD-10-CM | POA: Diagnosis not present

## 2022-01-16 DIAGNOSIS — I10 Essential (primary) hypertension: Secondary | ICD-10-CM | POA: Diagnosis not present

## 2022-01-16 DIAGNOSIS — A419 Sepsis, unspecified organism: Secondary | ICD-10-CM | POA: Diagnosis not present

## 2022-01-16 DIAGNOSIS — N179 Acute kidney failure, unspecified: Secondary | ICD-10-CM | POA: Diagnosis not present

## 2022-01-16 DIAGNOSIS — G9341 Metabolic encephalopathy: Secondary | ICD-10-CM | POA: Diagnosis not present

## 2022-01-16 DIAGNOSIS — C649 Malignant neoplasm of unspecified kidney, except renal pelvis: Secondary | ICD-10-CM | POA: Diagnosis not present

## 2022-01-16 DIAGNOSIS — G8929 Other chronic pain: Secondary | ICD-10-CM | POA: Diagnosis not present

## 2022-01-16 DIAGNOSIS — G40909 Epilepsy, unspecified, not intractable, without status epilepticus: Secondary | ICD-10-CM | POA: Diagnosis not present

## 2022-01-16 DIAGNOSIS — M545 Low back pain, unspecified: Secondary | ICD-10-CM | POA: Diagnosis not present

## 2022-01-16 DIAGNOSIS — D630 Anemia in neoplastic disease: Secondary | ICD-10-CM | POA: Diagnosis not present

## 2022-01-16 DIAGNOSIS — R918 Other nonspecific abnormal finding of lung field: Secondary | ICD-10-CM | POA: Diagnosis not present

## 2022-01-16 DIAGNOSIS — C7931 Secondary malignant neoplasm of brain: Secondary | ICD-10-CM | POA: Diagnosis not present

## 2022-01-16 MED ORDER — LENVIMA (18 MG DAILY DOSE) 10 MG & 2 X 4 MG PO CPPK
18.0000 mg | ORAL_CAPSULE | Freq: Every day | ORAL | 0 refills | Status: DC
  Filled 2022-01-16: qty 90, 30d supply, fill #0

## 2022-01-17 DIAGNOSIS — I455 Other specified heart block: Secondary | ICD-10-CM | POA: Diagnosis not present

## 2022-01-18 DIAGNOSIS — I455 Other specified heart block: Secondary | ICD-10-CM | POA: Diagnosis not present

## 2022-01-19 ENCOUNTER — Other Ambulatory Visit (HOSPITAL_COMMUNITY): Payer: Self-pay

## 2022-01-21 ENCOUNTER — Other Ambulatory Visit: Payer: Self-pay | Admitting: Oncology

## 2022-01-21 DIAGNOSIS — C642 Malignant neoplasm of left kidney, except renal pelvis: Secondary | ICD-10-CM

## 2022-01-22 ENCOUNTER — Inpatient Hospital Stay (HOSPITAL_BASED_OUTPATIENT_CLINIC_OR_DEPARTMENT_OTHER): Payer: PPO | Admitting: Internal Medicine

## 2022-01-22 ENCOUNTER — Telehealth: Payer: Self-pay | Admitting: Internal Medicine

## 2022-01-22 ENCOUNTER — Other Ambulatory Visit: Payer: Self-pay

## 2022-01-22 VITALS — BP 146/78 | HR 85 | Temp 97.9°F | Resp 18 | Ht 71.0 in | Wt 231.0 lb

## 2022-01-22 DIAGNOSIS — C7931 Secondary malignant neoplasm of brain: Secondary | ICD-10-CM | POA: Diagnosis not present

## 2022-01-22 DIAGNOSIS — Z79899 Other long term (current) drug therapy: Secondary | ICD-10-CM | POA: Diagnosis not present

## 2022-01-22 DIAGNOSIS — N189 Chronic kidney disease, unspecified: Secondary | ICD-10-CM | POA: Diagnosis not present

## 2022-01-22 DIAGNOSIS — Z5112 Encounter for antineoplastic immunotherapy: Secondary | ICD-10-CM | POA: Diagnosis not present

## 2022-01-22 DIAGNOSIS — I129 Hypertensive chronic kidney disease with stage 1 through stage 4 chronic kidney disease, or unspecified chronic kidney disease: Secondary | ICD-10-CM | POA: Diagnosis not present

## 2022-01-22 DIAGNOSIS — E538 Deficiency of other specified B group vitamins: Secondary | ICD-10-CM | POA: Diagnosis not present

## 2022-01-22 DIAGNOSIS — G40909 Epilepsy, unspecified, not intractable, without status epilepticus: Secondary | ICD-10-CM

## 2022-01-22 DIAGNOSIS — C7972 Secondary malignant neoplasm of left adrenal gland: Secondary | ICD-10-CM | POA: Diagnosis not present

## 2022-01-22 DIAGNOSIS — C642 Malignant neoplasm of left kidney, except renal pelvis: Secondary | ICD-10-CM | POA: Diagnosis not present

## 2022-01-22 DIAGNOSIS — C7951 Secondary malignant neoplasm of bone: Secondary | ICD-10-CM | POA: Diagnosis not present

## 2022-01-22 NOTE — Telephone Encounter (Signed)
Return call to patient's wife. Advised the monitor is to be worn 7 days. No other questions or concerns at this time.

## 2022-01-22 NOTE — Telephone Encounter (Signed)
Pt wife calling wanting to know how long is pt supposed to wear zio monitor

## 2022-01-22 NOTE — Telephone Encounter (Signed)
Per 11/27 lo called and left message for pt about appointment

## 2022-01-22 NOTE — Progress Notes (Signed)
 [facility] [address]  [place], [place] [address] [phone]   Interval Evaluation  Date of Service: 01/22/22 Patient Name: Phillip [name] Patient MRN: [id number] Patient DOB: 03/06/1945 Provider: Zachary K Vaslow, MD  Identifying Statement:  Phillip [name] is a 76 y.o. 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  04/06/21: SRS R frontal operculum ([place])  03/19/19-03/25/19 SRS Treatment: PTV1 Lt Temporal 31mm 27 Gy in 3 fractions PTV2 Lt Occipital 16mm  27 Gy in 3 fractions PTV6 Lt Frontal 20mm  27 Gy in 3 fractions   03/19/19 SRS Treatment:  PTV3 Rt Occipital 11mm  PTV4 Lt Frontal 12mm   PTV5 Rt Frontal 4mm  PTV7 Lt Parietal 9mm  All of these were treated to 20 Gy in 1 fraction   12/02/2015 to 12/12/2015 SBRT Treatment:  The L4 Right spinal was treated to 50 Gy in 5 fractions at 10 Gy per fraction  Interval History:  Phillip [name] presents today for follow up after recent hospitalization for cholecystitis.  He currently has a gall bladder drain in place, no surgery yet.  Complains of increased overall fatigue, lethargy.  Sleeping up to 20 hours per day.  Confusion and memory impairment are still present, improved since discharge however.  Holding keytruda, lenvatinib with Dr. Sherrill.  Denies headaches and seizures.  Has PT coming this week for evaluation.  Medications: Current Outpatient Medications on File Prior to Visit  Medication Sig Dispense Refill   acetaminophen (TYLENOL) 650 MG CR tablet Take 650 mg by mouth every 8 (eight) hours as needed for pain.     amoxicillin-clavulanate (AUGMENTIN) 875-125 MG tablet Take 1 tablet by mouth 2 (two) times daily for 10 days. 20 tablet 0   Cholecalciferol (VITAMIN D) 50 MCG (2000 UT) CAPS Take 2,000 Units by mouth daily.      COVID-19 mRNA vaccine 2023-2024 (COMIRNATY) syringe Inject into the muscle. 0.3 mL 0   Cyanocobalamin (VITAMIN B12) 1000 MCG TBCR Take 1,000 mcg by mouth daily.     docusate sodium (COLACE) 100 MG capsule Take 200 mg by mouth 2 (two) times daily.     dorzolamide (TRUSOPT) 2 % ophthalmic solution Instill 1 drop into right eye three times daily. 10 mL 3   hydrocortisone (CORTEF) 10 MG tablet Take 2 tablets (20 mg total) by mouth in the morning AND 1 tablet (10 mg total) every evening. 90 tablet 3   ibuprofen (ADVIL,MOTRIN) 200 MG tablet Take 200 mg by mouth every 8 (eight) hours as needed for fever, headache, mild pain, moderate pain or cramping.      lenvatinib 18 mg daily dose (LENVIMA, 18 MG DAILY DOSE,) 10 MG & 2 x 4 MG capsule Take 18 mg by mouth daily. 90 capsule 0   levETIRAcetam (KEPPRA) 250 MG tablet Take 250 mg by mouth 2 (two) times daily.     lidocaine-prilocaine (EMLA) cream APPLY TO PORTACATH 1 HOUR PRIOR TO USE AS NEEDED 30 g 2   losartan-hydrochlorothiazide (HYZAAR) 100-12.5 MG tablet Take 1 tablet by mouth once a day. 90 tablet 3   mirtazapine (REMERON) 30 MG tablet Take 1 tablet (30 mg total) by mouth at bedtime. 90 tablet 5   Multiple Vitamin (MULTIVITAMIN ADULT PO) Take 1 tablet by mouth daily. [place] product     ondansetron (ZOFRAN) 8 MG tablet TAKE 1   TABLET BY MOUTH EVERY 8 HOURS AS NEEDED FOR NAUSEA AND VOMITING (Patient taking differently: Take 8 mg by mouth every 8 (eight) hours as needed for nausea or vomiting.) 90 tablet 2   oxyCODONE (OXY IR/ROXICODONE) 5 MG immediate release tablet Take 1 tablet by mouth every 6 hours as needed for severe pain. 60 tablet 0   oxyCODONE (OXYCONTIN) 10 mg 12 hr tablet TAKE 2 TABLETS BY MOUTH EVERY 12 HOURS. (Patient taking differently: Take 20 mg by mouth every 12 (twelve) hours.) 120 tablet 0   polyethylene glycol (MIRALAX / GLYCOLAX) 17 g packet Take 17 g by mouth daily.     prochlorperazine (COMPAZINE) 10 MG tablet Take 1 tablet by mouth  every 6 hours as needed for nausea or vomiting. 30 tablet 2   saccharomyces boulardii (FLORASTOR) 250 MG capsule Take 1 capsule (250 mg total) by mouth 2 (two) times daily for 10 days. 20 capsule 0   Testosterone 1.62 % GEL Apply 2 pumps topically daily (Patient taking differently: Apply 2 Pump topically 2 (two) times a week.) 75 g 3   Current Facility-Administered Medications on File Prior to Visit  Medication Dose Route Frequency Provider Last Rate Last Admin   sodium chloride flush (NS) 0.9 % injection 10 mL  10 mL Intravenous PRN Sherrill, Gary B, MD   10 mL at 06/22/16 0921   sodium chloride flush (NS) 0.9 % injection 10 mL  10 mL Intravenous PRN Thomas, Lisa K, NP   10 mL at 08/21/21 1325   sodium chloride flush (NS) 0.9 % injection 10 mL  10 mL Intravenous PRN Sherrill, Gary B, MD   10 mL at 11/10/21 1002    Allergies:  Allergies  Allergen Reactions   Contrast Media [Iodinated Contrast Media] Rash   Past Medical History:  Past Medical History:  Diagnosis Date   Anxiety    Brain cancer ([name])    Chronic kidney disease    renal cancer   Constipation    Depression    Hypertension    Low testosterone in male    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   Seizures ([name])    last seizure 01/2019 - controlled since on keppra   Wears glasses    Past Surgical History:  Past Surgical History:  Procedure Laterality Date   insertion port-a-cath  2018   IR GENERIC HISTORICAL  11/18/2015   IR FLUORO GUIDED NEEDLE PLC ASPIRATION/INJECTION LOC 11/18/2015 Sanjeev Deveshwar, MD [facility]-INTER[facility] RAD   IR GENERIC HISTORICAL  05/16/2016   IR FLUORO GUIDE PORT INSERTION RIGHT 05/16/2016 Phillip McCullough, MD [facility]-INTER[facility] RAD   IR GENERIC HISTORICAL  05/16/2016   IR US GUIDE VASC ACCESS RIGHT 05/16/2016 Phillip McCullough, MD [facility]   IR PERC CHOLECYSTOSTOMY  01/09/2022   RADIOLOGY WITH ANESTHESIA N/A 06/16/2019   Procedure: MRI BRAIN WITH AND WITHOUT CONTRAST WITH ANESTHESIA;  Surgeon: Radiologist,  Medication, MD;  Location: [facility] OR;  Service: Radiology;  Laterality: N/A;   Social History:  Social History   Socioeconomic History   Marital status: Married    Spouse name: Not on file   Number of children: Not on file   Years of education: Not on file   Highest education level: Not on file  Occupational History   Not on file  Tobacco Use   Smoking status: Never   Smokeless tobacco: Never  Vaping Use   Vaping Use: Never used  Substance and Sexual Activity   Alcohol use: Not Currently      Alcohol/week: 1.0 standard drink of alcohol    Types: 1 Glasses of wine per week   Drug use: Yes    Types: Marijuana    Comment: Last use 06/13/19   Sexual activity: Not Currently  Other Topics Concern   Not on file  Social History Narrative   Not on file   Social Determinants of Health   Financial Resource Strain: Not on file  Food Insecurity: No Food Insecurity (01/08/2022)   Hunger Vital Sign    Worried About Running Out of Food in the Last Year: Never true    Ran Out of Food in the Last Year: Never true  Transportation Needs: No Transportation Needs (01/08/2022)   PRAPARE - Hydrologist (Medical): No    Lack of Transportation (Non-Medical): No  Physical Activity: Not on file  Stress: Not on file  Social Connections: Not on file  Intimate Partner Violence: Not At Risk (01/08/2022)   Humiliation, Afraid, Rape, and Kick questionnaire    Fear of Current or Ex-Partner: No    Emotionally Abused: No    Physically Abused: No    Sexually Abused: No   Family History:  Family History  Problem Relation Age of Onset   Cancer Grandchild        lymphoma    Review of Systems: Constitutional: Doesn't report fevers, chills or abnormal weight loss Eyes: Doesn't report blurriness of vision Ears, nose, mouth, throat, and face: Doesn't report sore throat Respiratory: Doesn't report cough, dyspnea or wheezes Cardiovascular: Doesn't report palpitation, chest  discomfort  Gastrointestinal:  Doesn't report nausea, constipation, diarrhea GU: Doesn't report incontinence Skin: Doesn't report skin rashes Neurological: Per HPI Musculoskeletal: Doesn't report joint pain Behavioral/Psych: Doesn't report anxiety  Physical Exam: Vitals:   01/22/22 1130  BP: (!) 146/78  Pulse: 85  Resp: 18  Temp: 97.9 F (36.6 C)  SpO2: 95%   KPS: 60. General: Alert, cooperative, pleasant, in no acute distress Head: Normal EENT: No conjunctival injection or scleral icterus.  Lungs: Resp effort normal Cardiac: Regular rate Abdomen: Abdominal drain Skin: No rashes cyanosis or petechiae. Extremities: No clubbing or edema  Neurologic Exam: Mental Status: Drowsy, alert with conversation. Oriented to self and environment. Pyschomotor slowing and impaired recall. Cranial Nerves: Visual acuity is grossly normal. Visual fields are full. Extra-ocular movements intact. No ptosis. Face is symmetric Motor: Tone and bulk are normal. Power is full in both arms and legs. Reflexes are symmetric, no pathologic reflexes present.  Sensory: Intact to light touch Gait: Deferred   Labs: I have reviewed the data as listed    Component Value Date/Time   NA 141 01/12/2022 0422   NA 140 02/21/2017 1128   K 3.0 (L) 01/12/2022 0422   K 4.3 02/21/2017 1128   CL 105 01/12/2022 0422   CO2 30 01/12/2022 0422   CO2 26 02/21/2017 1128   GLUCOSE 109 (H) 01/12/2022 0422   GLUCOSE 115 02/21/2017 1128   BUN 17 01/12/2022 0422   BUN 16.6 02/21/2017 1128   CREATININE 0.96 01/12/2022 0422   CREATININE 0.82 12/27/2021 1017   CREATININE 0.8 02/21/2017 1128   CALCIUM 8.6 (L) 01/12/2022 0422   CALCIUM 9.4 02/21/2017 1128   PROT 5.6 (L) 01/10/2022 0307   PROT 7.5 02/21/2017 1128   ALBUMIN 2.2 (L) 01/10/2022 0307   ALBUMIN 4.2 02/21/2017 1128   AST 20 01/10/2022 0307   AST 10 (L) 12/27/2021 1017   AST 20 02/21/2017 1128   ALT 14 01/10/2022  0307   ALT 11 12/27/2021 1017   ALT 43  02/21/2017 1128   ALKPHOS 56 01/10/2022 0307   ALKPHOS 104 02/21/2017 1128   BILITOT 0.4 01/10/2022 0307   BILITOT 0.2 (L) 12/27/2021 1017   BILITOT 0.36 02/21/2017 1128   GFRNONAA >60 01/12/2022 0422   GFRNONAA >60 12/27/2021 1017   GFRAA >60 11/05/2019 0957   Lab Results  Component Value Date   WBC 7.9 01/12/2022   NEUTROABS 4.7 01/12/2022   HGB 9.5 (L) 01/12/2022   HCT 32.3 (L) 01/12/2022   MCV 87.1 01/12/2022   PLT 239 01/12/2022    Imaging:  [place] Clinician Interpretation: I have personally reviewed the CNS images as listed.  My interpretation, in the context of the patient's clinical presentation, is progressive disease  ECHOCARDIOGRAM COMPLETE  Result Date: 01/12/2022    ECHOCARDIOGRAM REPORT   Patient Name:   Phillip [name] Date of Exam: 01/12/2022 Medical Rec #:  [id number]          Height:       71.0 in Accession #:    [id number]         Weight:       250.0 lb Date of Birth:  1945-07-02           BSA:          2.318 m Patient Age:    73 years           BP:           168/85 mmHg Patient Gender: M                  HR:           86 bpm. Exam Location:  Inpatient Procedure: 2D Echo, Cardiac Doppler, Color Doppler and Intracardiac            Opacification Agent Indications:    R94.31 Abnormal EKG  History:        Patient has no prior history of Echocardiogram examinations.                 Risk Factors:Hypertension. Metastatic cancer.  Sonographer:    Roseanna Rainbow RDCS Referring Phys: 68 RHONDA G BARRETT  Sonographer Comments: Technically difficult study due to poor echo windows and patient is obese. Image acquisition challenging due to patient body habitus. IMPRESSIONS  1. Left ventricular ejection fraction, by estimation, is 60 to 65%. The left ventricle has normal function. The left ventricle has no regional wall motion abnormalities. There is mild concentric left ventricular hypertrophy. Left ventricular diastolic parameters were normal.  2. Right ventricular systolic function is  normal. The right ventricular size is normal. Tricuspid regurgitation signal is inadequate for assessing PA pressure.  3. The mitral valve is grossly normal. Trivial mitral valve regurgitation. No evidence of mitral stenosis.  4. The aortic valve is tricuspid. There is mild calcification of the aortic valve. Aortic valve regurgitation is not visualized. Aortic valve sclerosis is present, with no evidence of aortic valve stenosis.  5. The inferior vena cava is normal in size with greater than 50% respiratory variability, suggesting right atrial pressure of 3 mmHg. FINDINGS  Left Ventricle: Left ventricular ejection fraction, by estimation, is 60 to 65%. The left ventricle has normal function. The left ventricle has no regional wall motion abnormalities. Definity contrast agent was given IV to delineate the left ventricular  endocardial borders. The left ventricular internal cavity size was normal in size. There is mild concentric left ventricular hypertrophy.  Left ventricular diastolic parameters were normal. Right Ventricle: The right ventricular size is normal. No increase in right ventricular wall thickness. Right ventricular systolic function is normal. Tricuspid regurgitation signal is inadequate for assessing PA pressure. Left Atrium: Left atrial size was normal in size. Right Atrium: Right atrial size was normal in size. Pericardium: Trivial pericardial effusion is present. Presence of epicardial fat layer. Mitral Valve: The mitral valve is grossly normal. Trivial mitral valve regurgitation. No evidence of mitral valve stenosis. Tricuspid Valve: The tricuspid valve is grossly normal. Tricuspid valve regurgitation is trivial. No evidence of tricuspid stenosis. Aortic Valve: The aortic valve is tricuspid. There is mild calcification of the aortic valve. Aortic valve regurgitation is not visualized. Aortic valve sclerosis is present, with no evidence of aortic valve stenosis. Aortic valve mean gradient measures  8.3 mmHg. Aortic valve peak gradient measures 16.1 mmHg. Aortic valve area, by VTI measures 3.24 cm. Pulmonic Valve: The pulmonic valve was grossly normal. Pulmonic valve regurgitation is trivial. No evidence of pulmonic stenosis. Aorta: The aortic root and ascending aorta are structurally normal, with no evidence of dilitation. Venous: The inferior vena cava is normal in size with greater than 50% respiratory variability, suggesting right atrial pressure of 3 mmHg. IAS/Shunts: The atrial septum is grossly normal.  LEFT VENTRICLE PLAX 2D LVIDd:         4.90 cm      Diastology LVIDs:         3.10 cm      LV e' medial:    7.72 cm/s LV PW:         1.20 cm      LV E/e' medial:  11.7 LV IVS:        1.40 cm      LV e' lateral:   10.60 cm/s LVOT diam:     2.50 cm      LV E/e' lateral: 8.5 LV SV:         113 LV SV Index:   49 LVOT Area:     4.91 cm  LV Volumes (MOD) LV vol d, MOD A2C: 102.0 ml LV vol d, MOD A4C: 132.0 ml LV vol s, MOD A2C: 29.0 ml LV vol s, MOD A4C: 37.7 ml LV SV MOD A2C:     73.0 ml LV SV MOD A4C:     132.0 ml LV SV MOD BP:      83.3 ml RIGHT VENTRICLE             IVC RV S prime:     21.30 cm/s  IVC diam: 1.70 cm TAPSE (M-mode): 3.1 cm LEFT ATRIUM             Index        RIGHT ATRIUM           Index LA diam:        3.10 cm 1.34 cm/m   RA Area:     21.60 cm LA Vol (A2C):   77.0 ml 33.21 ml/m  RA Volume:   69.70 ml  30.06 ml/m LA Vol (A4C):   53.7 ml 23.16 ml/m LA Biplane Vol: 63.2 ml 27.26 ml/m  AORTIC VALVE AV Area (Vmax):    2.91 cm AV Area (Vmean):   2.92 cm AV Area (VTI):     3.24 cm AV Vmax:           200.67 cm/s AV Vmean:          133.000 cm/s AV VTI:  0.349 m AV Peak Grad:      16.1 mmHg AV Mean Grad:      8.3 mmHg LVOT Vmax:         119.00 cm/s LVOT Vmean:        79.000 cm/s LVOT VTI:          0.230 m LVOT/AV VTI ratio: 0.66  AORTA Ao Root diam: 3.60 cm Ao Asc diam:  3.70 cm MITRAL VALVE MV Area (PHT): 3.07 cm     SHUNTS MV Decel Time: 247 msec     Systemic VTI:  0.23 m MV E  velocity: 90.40 cm/s   Systemic Diam: 2.50 cm MV A velocity: 116.86 cm/s MV E/A ratio:  0.77 Crane O'Neal MD Electronically signed by  O'Neal MD Signature Date/Time: 01/12/2022/2:32:20 PM    Final    IR Perc Cholecystostomy  Result Date: 01/09/2022 INDICATION: 76-year-old male referred for percutaneous cholecystostomy EXAM: CHOLECYSTOSTOMY MEDICATIONS: None ANESTHESIA/SEDATION: Moderate (conscious) sedation was employed during this procedure. A total of Versed 0.5 mg and Fentanyl 100 mcg was administered intravenously. Moderate Sedation Time: 22 minutes. The patient's level of consciousness and vital signs were monitored continuously by radiology nursing throughout the procedure under my direct supervision. FLUOROSCOPY TIME:  Fluoroscopy Time: 1 minutes 6 seconds (19 mGy). COMPLICATIONS: None PROCEDURE: Informed written consent was obtained from the patient and the patient's family after a thorough discussion of the procedural risks, benefits and alternatives. All questions were addressed. Maximal Sterile Barrier Technique was utilized including caps, mask, sterile gowns, sterile gloves, sterile drape, hand hygiene and skin antiseptic. A timeout was performed prior to the initiation of the procedure. Ultrasound survey of the right upper quadrant was performed for planning purposes. Once the patient is prepped and draped in the usual sterile fashion, the skin and subcutaneous tissues overlying the gallbladder were generously infiltrated 1% lidocaine for local anesthesia. A coaxial needle was advanced under ultrasound guidance through the skin subcutaneous tissues and a small segment of liver into the gallbladder lumen. With removal of the stylet, spontaneous dark bile drainage occurred. Using modified Seldinger technique, a 10 French drain was placed into the gallbladder fossa, with aspiration of the sample for the lab. Contrast injection confirmed position of the tube within the gallbladder lumen.  Drainage catheter was attached to gravity drain with a suture retention placed. Patient tolerated the procedure well and remained hemodynamically stable throughout. No complications were encountered and no significant blood loss encountered. IMPRESSION: Status post percutaneous cholecystostomy Signed, Jaime S. Wagner, DO, ABVM, RPVI [facility] Electronically Signed   By: Jaime  Wagner D.O.   On: 01/09/2022 14:10   US Abdomen Limited RUQ (LIVER/GB)  Result Date: 01/08/2022 CLINICAL DATA:  Right upper quadrant abdominal pain. EXAM: ULTRASOUND ABDOMEN LIMITED RIGHT UPPER QUADRANT COMPARISON:  CT of the abdomen pelvis 01/08/2022 FINDINGS: Gallbladder: Gallbladder wall thickening measures up to 6 mm. Multiple shadowing stones are present in the neck of the gallbladder. No sonographic Murphy sign was reported. Common bile duct: Diameter: 4.0 mm, within normal limits Liver: Liver is diffusely echogenic. No discrete lesions are present. Portal vein is patent on color Doppler imaging with normal direction of blood flow towards the liver. Other: None. IMPRESSION: 1. Cholelithiasis with gallbladder wall thickening. Findings are equivocal for acute cholecystitis. 2. Fatty infiltration of the liver. Electronically Signed   By: Christopher  Mattern M.D.   On: 01/08/2022 12:04   CT Chest Wo Contrast  Result Date: 01/08/2022 CLINICAL DATA:  History of   renal cell carcinoma currently on treatment increased weakness, abdominal pain, nausea and vomiting EXAM: CT CHEST, ABDOMEN AND PELVIS WITHOUT CONTRAST TECHNIQUE: Multidetector CT imaging of the chest, abdomen and pelvis was performed following the standard protocol without IV contrast. RADIATION DOSE REDUCTION: This exam was performed according to the departmental dose-optimization program which includes automated exposure control, adjustment of the mA and/or kV according to patient size and/or use of iterative  reconstruction technique. COMPARISON:  CT chest, abdomen, and pelvis dated 11/10/2021 FINDINGS: CT CHEST FINDINGS Cardiovascular: Right chest wall port tip terminates in the lower SVC. Normal heart size. No significant pericardial fluid/thickening. Coronary artery calcifications. Great vessels are normal in course and caliber. Aortic atherosclerosis. Mediastinum/Nodes: thyroid gland without nodules meeting criteria for imaging follow-up by size. Normal esophagus. No pathologically enlarged axillary, supraclavicular, mediastinal, or hilar lymph nodes. Lungs/Pleura: There is mild respiratory motion artifact. The central airways are patent. No focal consolidation. Decreased conspicuity of previously noted right upper lobe nodules measuring average 4 mm (6:41) and 3 mm (6:65). No definite new or enlarging pulmonary nodules. No pneumothorax. No pleural effusion. Musculoskeletal: Similar multifocal osseous metastases, most notably mixed lytic and sclerotic appearance of T1 and T9 and similar expansile lytic lesion in right T11 costovertebral junction. Slightly increased sclerotic component of the left 4th rib (5:187). CT ABDOMEN PELVIS FINDINGS Hepatobiliary: Similar segment 4 cyst (2:53). Previously noted segment 3 cystic focus is not well seen. No new focal hepatic lesions. No intra or extrahepatic biliary ductal dilation. Distended gallbladder containing gallstones. Similar gallbladder fundal adenomyomatosis. New mild pericholecystic stranding. Pancreas: No pancreatic ductal dilatation or surrounding inflammatory changes. Spleen: Normal in size without focal abnormality. Adrenals/Urinary Tract: No adrenal nodules. No substantial change in size of treated left upper pole lesion measuring 2.2 x 2.0 cm (2:67). Unchanged subcentimeter exophytic focus along the anterior lower pole left kidney (2:81). No new or enlarging renal masses. No calculi or hydronephrosis. No focal bladder wall thickening. Stomach/Bowel: Normal  appearance of the stomach. No evidence of bowel wall thickening, distention, or inflammatory changes. Normal appendix. Vascular/Lymphatic: No significant vascular findings are present. No enlarged abdominal or pelvic lymph nodes. Reproductive: Enlarged prostate gland. Other: Increased small volume free fluid.  No fluid collection. Musculoskeletal: Multifocal osseous metastases with increased soft tissue component of a lytic left acetabular metastasis, for example measuring 3.9 x 2.4 cm inferiorly (2:126), previously 3.2 x 2.2 cm. Unchanged compression of L1 and L4. IMPRESSION: 1. Distended gallbladder containing gallstones with new mild pericholecystic stranding, which may reflect acute cholecystitis. Recommend correlation with right upper quadrant ultrasound for further evaluation. 2. Increased small volume free fluid in the abdomen and pelvis. 3. Multifocal osseous metastases with increased soft tissue component of a lytic left acetabular metastasis and increased sclerotic component of a left 4th rib metastasis. 4. Decreased conspicuity of previously noted right upper lobe pulmonary nodules. No definite new or enlarging pulmonary nodules. 5. No substantial change in size of treated left upper pole renal lesion. 6. Aortic atherosclerosis. Aortic Atherosclerosis (ICD10-I70.0). Electronically Signed   By: Darrin Nipper M.D.   On: 01/08/2022 11:04   CT ABDOMEN PELVIS WO CONTRAST  Result Date: 01/08/2022 CLINICAL DATA:  History of renal cell carcinoma currently on treatment increased weakness, abdominal pain, nausea and vomiting EXAM: CT CHEST, ABDOMEN AND PELVIS WITHOUT CONTRAST TECHNIQUE: Multidetector CT imaging of the chest, abdomen and pelvis was performed following the standard protocol without IV contrast. RADIATION DOSE REDUCTION: This exam was performed according to the departmental dose-optimization program which includes automated  exposure control, adjustment of the mA and/or kV according to patient size  and/or use of iterative reconstruction technique. COMPARISON:  CT chest, abdomen, and pelvis dated 11/10/2021 FINDINGS: CT CHEST FINDINGS Cardiovascular: Right chest wall port tip terminates in the lower SVC. Normal heart size. No significant pericardial fluid/thickening. Coronary artery calcifications. Great vessels are normal in course and caliber. Aortic atherosclerosis. Mediastinum/Nodes: thyroid gland without nodules meeting criteria for imaging follow-up by size. Normal esophagus. No pathologically enlarged axillary, supraclavicular, mediastinal, or hilar lymph nodes. Lungs/Pleura: There is mild respiratory motion artifact. The central airways are patent. No focal consolidation. Decreased conspicuity of previously noted right upper lobe nodules measuring average 4 mm (6:41) and 3 mm (6:65). No definite new or enlarging pulmonary nodules. No pneumothorax. No pleural effusion. Musculoskeletal: Similar multifocal osseous metastases, most notably mixed lytic and sclerotic appearance of T1 and T9 and similar expansile lytic lesion in right T11 costovertebral junction. Slightly increased sclerotic component of the left 4th rib (5:187). CT ABDOMEN PELVIS FINDINGS Hepatobiliary: Similar segment 4 cyst (2:53). Previously noted segment 3 cystic focus is not well seen. No new focal hepatic lesions. No intra or extrahepatic biliary ductal dilation. Distended gallbladder containing gallstones. Similar gallbladder fundal adenomyomatosis. New mild pericholecystic stranding. Pancreas: No pancreatic ductal dilatation or surrounding inflammatory changes. Spleen: Normal in size without focal abnormality. Adrenals/Urinary Tract: No adrenal nodules. No substantial change in size of treated left upper pole lesion measuring 2.2 x 2.0 cm (2:67). Unchanged subcentimeter exophytic focus along the anterior lower pole left kidney (2:81). No new or enlarging renal masses. No calculi or hydronephrosis. No focal bladder wall thickening.  Stomach/Bowel: Normal appearance of the stomach. No evidence of bowel wall thickening, distention, or inflammatory changes. Normal appendix. Vascular/Lymphatic: No significant vascular findings are present. No enlarged abdominal or pelvic lymph nodes. Reproductive: Enlarged prostate gland. Other: Increased small volume free fluid.  No fluid collection. Musculoskeletal: Multifocal osseous metastases with increased soft tissue component of a lytic left acetabular metastasis, for example measuring 3.9 x 2.4 cm inferiorly (2:126), previously 3.2 x 2.2 cm. Unchanged compression of L1 and L4. IMPRESSION: 1. Distended gallbladder containing gallstones with new mild pericholecystic stranding, which may reflect acute cholecystitis. Recommend correlation with right upper quadrant ultrasound for further evaluation. 2. Increased small volume free fluid in the abdomen and pelvis. 3. Multifocal osseous metastases with increased soft tissue component of a lytic left acetabular metastasis and increased sclerotic component of a left 4th rib metastasis. 4. Decreased conspicuity of previously noted right upper lobe pulmonary nodules. No definite new or enlarging pulmonary nodules. 5. No substantial change in size of treated left upper pole renal lesion. 6. Aortic atherosclerosis. Aortic Atherosclerosis (ICD10-I70.0). Electronically Signed   By: Limin  Xu M.D.   On: 01/08/2022 11:04   CT Head Wo Contrast  Result Date: 01/08/2022 CLINICAL DATA:  Worsening weakness.  Known brain metastases. EXAM: CT HEAD WITHOUT CONTRAST TECHNIQUE: Contiguous axial images were obtained from the base of the skull through the vertex without intravenous contrast. RADIATION DOSE REDUCTION: This exam was performed according to the departmental dose-optimization program which includes automated exposure control, adjustment of the mA and/or kV according to patient size and/or use of iterative reconstruction technique. COMPARISON:  Head MRI 01/02/2022  FINDINGS: Brain: There is no evidence of an acute infarct, acute intracranial hemorrhage, midline shift, or extra-axial fluid collection. Two known small metastases in the medial left frontal lobe are partially calcified. Mild-to-moderate left frontal lobe edema is similar to the prior MRI. Smaller areas of   edema or gliosis in the left temporal and left occipital lobes are unchanged. 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 There is a background of mild chronic small vessel ischemia elsewhere in the cerebral white matter. Vascular: Calcified atherosclerosis at the skull base. No hyperdense vessel. Skull: No fracture or suspicious osseous lesion. Sinuses/Orbits: Mild bilateral ethmoid air cell mucosal thickening. Clear mastoid air cells. Unremarkable orbits. Other: None. IMPRESSION: Known brain metastases without evidence of worsening edema, acute hemorrhage, or other acute intracranial abnormality. Electronically Signed   By: Logan Bores M.D.   On: 01/08/2022 10:36   DG Chest Port 1 View  Result Date: 01/08/2022 CLINICAL DATA:  76 year old male with history of hypoxia and weakness. History of renal cell carcinoma. EXAM: PORTABLE CHEST 1 VIEW COMPARISON:  No prior chest x-ray.  Chest CT 11/10/2021. FINDINGS: Right-sided internal jugular single-lumen Port-A-Cath with tip terminating at the superior cavoatrial junction. Lung volumes are normal. No consolidative airspace disease. No pleural effusions. No pneumothorax. No pulmonary nodule or mass noted. Pulmonary vasculature and the cardiomediastinal silhouette are within normal limits. Atherosclerotic calcifications in the thoracic aorta. IMPRESSION: 1. No radiographic evidence of acute cardiopulmonary disease. No definite signs of metastatic disease in the thorax. Electronically Signed   By: Vinnie Langton M.D.   On: 01/08/2022 09:38   MR Brain W Wo Contrast  Result Date: 01/02/2022 CLINICAL DATA:  CNS neoplasm monitoring EXAM:  MRI HEAD WITHOUT AND WITH CONTRAST TECHNIQUE: Multiplanar, multiecho pulse sequences of the brain and surrounding structures were obtained without and with intravenous contrast. CONTRAST:  69m GADAVIST GADOBUTROL 1 MMOL/ML IV SOLN COMPARISON:  10/19/2021 FINDINGS: BRAIN New Lesions: 4 mm lesion in the right parietal cortex on 1100:225 Larger lesions: None. Stable or Smaller lesions: None. *The 2 lesions highlighted on prior, punctate in the right cerebellum on image 149 in the superior right frontal cortex on 247, are unchanged in size. *No growth, even partially contracted heterogeneously enhancing mass in the parasagittal left anterior frontal lobe measured on image 223, but the anterior aspect is more solid-appearing than before, deserving attention at follow-up. Additional lesions seen: *In the left parietal lobe on 233 *Left parietal lobe on 224 *Right parietal cortex on 207 *Right occipital parietal cortex on 182 *Left occipital lobe on 163 *Left inferior temporal lobe in 2 locations on image 140 Other Brain findings: No infarct, hemorrhage, hydrocephalus, or collection. Vascular: Normal flow voids and vascular enhancements Skull and upper cervical spine: No focal marrow lesion Sinuses/Orbits: Negative IMPRESSION: 1. New 4 mm lesion in the right parietal cortex. 2. Lesions highlighted on prior study in the right cerebellum and right frontal lobe are unchanged in the interim. 3. The largest lesion in the left frontal lobe is size stable but has more solid enhancing characteristics anteriorly, attention at follow-up. Electronically Signed   By: JJorje GuildM.D.   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  MRI brain demonstrates small new metastasis within right parietal lobe.  This region does not overlap with prior radiosurgery fields.  We discussed and  recommended radiosurgery for this new site of disease once complete with and recovered from cholecystectomy.  He is agreeable with this plan.  [facility]  He should continue Keppra 5079mBID for seizure prevention.    We appreciate the opportunity to participate in the care  of Phillip [name].    We ask that Calix Phillip Werden return to clinic in ~2 months following SRS with brain MRI, or sooner as needed.  All questions were answered. The patient knows to call the clinic with any problems, questions or concerns. No barriers to learning were detected.  I have spent a total of 30 minutes of face-to-face and non-face-to-face time, excluding clinical staff time, preparing to see patient, ordering tests and/or medications, counseling the patient, and independently interpreting results and communicating results to the patient/family/caregiver    Zachary K Vaslow, MD Medical Director of Neuro-Oncology [facility] 01/22/22 11:44 AM 

## 2022-01-23 ENCOUNTER — Other Ambulatory Visit: Payer: Self-pay | Admitting: *Deleted

## 2022-01-23 DIAGNOSIS — C7931 Secondary malignant neoplasm of brain: Secondary | ICD-10-CM

## 2022-01-25 ENCOUNTER — Inpatient Hospital Stay: Payer: PPO

## 2022-01-25 ENCOUNTER — Inpatient Hospital Stay: Payer: PPO | Admitting: Oncology

## 2022-01-26 ENCOUNTER — Other Ambulatory Visit (HOSPITAL_COMMUNITY): Payer: Self-pay

## 2022-01-26 DIAGNOSIS — E2740 Unspecified adrenocortical insufficiency: Secondary | ICD-10-CM | POA: Diagnosis not present

## 2022-01-26 DIAGNOSIS — D630 Anemia in neoplastic disease: Secondary | ICD-10-CM | POA: Diagnosis not present

## 2022-01-26 DIAGNOSIS — I700 Atherosclerosis of aorta: Secondary | ICD-10-CM | POA: Diagnosis not present

## 2022-01-26 DIAGNOSIS — C7951 Secondary malignant neoplasm of bone: Secondary | ICD-10-CM | POA: Diagnosis not present

## 2022-01-26 DIAGNOSIS — K8000 Calculus of gallbladder with acute cholecystitis without obstruction: Secondary | ICD-10-CM | POA: Diagnosis not present

## 2022-01-26 DIAGNOSIS — G9341 Metabolic encephalopathy: Secondary | ICD-10-CM | POA: Diagnosis not present

## 2022-01-26 DIAGNOSIS — Z9181 History of falling: Secondary | ICD-10-CM | POA: Diagnosis not present

## 2022-01-26 DIAGNOSIS — G40909 Epilepsy, unspecified, not intractable, without status epilepticus: Secondary | ICD-10-CM | POA: Diagnosis not present

## 2022-01-26 DIAGNOSIS — I10 Essential (primary) hypertension: Secondary | ICD-10-CM | POA: Diagnosis not present

## 2022-01-26 DIAGNOSIS — A419 Sepsis, unspecified organism: Secondary | ICD-10-CM | POA: Diagnosis not present

## 2022-01-26 DIAGNOSIS — C649 Malignant neoplasm of unspecified kidney, except renal pelvis: Secondary | ICD-10-CM | POA: Diagnosis not present

## 2022-01-26 DIAGNOSIS — Z792 Long term (current) use of antibiotics: Secondary | ICD-10-CM | POA: Diagnosis not present

## 2022-01-26 DIAGNOSIS — M545 Low back pain, unspecified: Secondary | ICD-10-CM | POA: Diagnosis not present

## 2022-01-26 DIAGNOSIS — G8929 Other chronic pain: Secondary | ICD-10-CM | POA: Diagnosis not present

## 2022-01-26 DIAGNOSIS — C7931 Secondary malignant neoplasm of brain: Secondary | ICD-10-CM | POA: Diagnosis not present

## 2022-01-26 DIAGNOSIS — E538 Deficiency of other specified B group vitamins: Secondary | ICD-10-CM | POA: Diagnosis not present

## 2022-01-26 DIAGNOSIS — N179 Acute kidney failure, unspecified: Secondary | ICD-10-CM | POA: Diagnosis not present

## 2022-01-26 DIAGNOSIS — R918 Other nonspecific abnormal finding of lung field: Secondary | ICD-10-CM | POA: Diagnosis not present

## 2022-01-26 DIAGNOSIS — K760 Fatty (change of) liver, not elsewhere classified: Secondary | ICD-10-CM | POA: Diagnosis not present

## 2022-01-26 MED ORDER — AMOXICILLIN-POT CLAVULANATE 875-125 MG PO TABS
ORAL_TABLET | ORAL | 0 refills | Status: DC
  Filled 2022-01-26: qty 14, 7d supply, fill #0

## 2022-01-29 ENCOUNTER — Other Ambulatory Visit: Payer: Self-pay | Admitting: Nurse Practitioner

## 2022-01-29 ENCOUNTER — Ambulatory Visit: Payer: PPO | Admitting: Internal Medicine

## 2022-01-29 ENCOUNTER — Other Ambulatory Visit (HOSPITAL_COMMUNITY): Payer: Self-pay

## 2022-01-29 DIAGNOSIS — C649 Malignant neoplasm of unspecified kidney, except renal pelvis: Secondary | ICD-10-CM

## 2022-01-29 MED ORDER — OXYCODONE HCL 5 MG PO TABS
5.0000 mg | ORAL_TABLET | Freq: Four times a day (QID) | ORAL | 0 refills | Status: DC | PRN
  Filled 2022-01-29: qty 60, 15d supply, fill #0

## 2022-01-29 MED ORDER — OXYCODONE HCL ER 10 MG PO T12A
20.0000 mg | EXTENDED_RELEASE_TABLET | Freq: Two times a day (BID) | ORAL | 0 refills | Status: DC
  Filled 2022-01-29: qty 120, 30d supply, fill #0

## 2022-01-29 MED ORDER — ONDANSETRON HCL 8 MG PO TABS
8.0000 mg | ORAL_TABLET | Freq: Three times a day (TID) | ORAL | 2 refills | Status: DC | PRN
  Filled 2022-01-29: qty 90, 30d supply, fill #0
  Filled 2022-02-28: qty 90, 30d supply, fill #1
  Filled 2022-03-28: qty 90, 30d supply, fill #2

## 2022-01-30 ENCOUNTER — Other Ambulatory Visit: Payer: Self-pay | Admitting: Radiation Therapy

## 2022-02-11 ENCOUNTER — Other Ambulatory Visit: Payer: Self-pay | Admitting: Oncology

## 2022-02-13 ENCOUNTER — Other Ambulatory Visit (HOSPITAL_COMMUNITY): Payer: Self-pay

## 2022-02-13 ENCOUNTER — Other Ambulatory Visit: Payer: Self-pay | Admitting: Oncology

## 2022-02-13 MED ORDER — LENVIMA (18 MG DAILY DOSE) 10 MG & 2 X 4 MG PO CPPK
18.0000 mg | ORAL_CAPSULE | Freq: Every day | ORAL | 0 refills | Status: DC
  Filled 2022-02-13: qty 90, 30d supply, fill #0

## 2022-02-14 ENCOUNTER — Other Ambulatory Visit: Payer: Self-pay

## 2022-02-15 ENCOUNTER — Inpatient Hospital Stay: Payer: PPO

## 2022-02-15 ENCOUNTER — Other Ambulatory Visit (HOSPITAL_COMMUNITY): Payer: Self-pay

## 2022-02-15 ENCOUNTER — Inpatient Hospital Stay (HOSPITAL_BASED_OUTPATIENT_CLINIC_OR_DEPARTMENT_OTHER): Payer: PPO | Admitting: Oncology

## 2022-02-15 ENCOUNTER — Inpatient Hospital Stay: Payer: PPO | Attending: Nurse Practitioner

## 2022-02-15 VITALS — BP 127/81 | HR 84 | Temp 98.1°F | Resp 18 | Ht 71.0 in | Wt 226.0 lb

## 2022-02-15 VITALS — BP 127/76 | HR 68 | Resp 20

## 2022-02-15 DIAGNOSIS — C7931 Secondary malignant neoplasm of brain: Secondary | ICD-10-CM | POA: Insufficient documentation

## 2022-02-15 DIAGNOSIS — E538 Deficiency of other specified B group vitamins: Secondary | ICD-10-CM | POA: Insufficient documentation

## 2022-02-15 DIAGNOSIS — C642 Malignant neoplasm of left kidney, except renal pelvis: Secondary | ICD-10-CM | POA: Insufficient documentation

## 2022-02-15 DIAGNOSIS — C7951 Secondary malignant neoplasm of bone: Secondary | ICD-10-CM | POA: Insufficient documentation

## 2022-02-15 DIAGNOSIS — I10 Essential (primary) hypertension: Secondary | ICD-10-CM | POA: Insufficient documentation

## 2022-02-15 DIAGNOSIS — Z5112 Encounter for antineoplastic immunotherapy: Secondary | ICD-10-CM | POA: Insufficient documentation

## 2022-02-15 LAB — CBC WITH DIFFERENTIAL (CANCER CENTER ONLY)
Abs Immature Granulocytes: 0.02 10*3/uL (ref 0.00–0.07)
Basophils Absolute: 0 10*3/uL (ref 0.0–0.1)
Basophils Relative: 0 %
Eosinophils Absolute: 0.5 10*3/uL (ref 0.0–0.5)
Eosinophils Relative: 6 %
HCT: 36.9 % — ABNORMAL LOW (ref 39.0–52.0)
Hemoglobin: 11.3 g/dL — ABNORMAL LOW (ref 13.0–17.0)
Immature Granulocytes: 0 %
Lymphocytes Relative: 21 %
Lymphs Abs: 2.1 10*3/uL (ref 0.7–4.0)
MCH: 26 pg (ref 26.0–34.0)
MCHC: 30.6 g/dL (ref 30.0–36.0)
MCV: 84.8 fL (ref 80.0–100.0)
Monocytes Absolute: 0.8 10*3/uL (ref 0.1–1.0)
Monocytes Relative: 9 %
Neutro Abs: 6.2 10*3/uL (ref 1.7–7.7)
Neutrophils Relative %: 64 %
Platelet Count: 290 10*3/uL (ref 150–400)
RBC: 4.35 MIL/uL (ref 4.22–5.81)
RDW: 17 % — ABNORMAL HIGH (ref 11.5–15.5)
WBC Count: 9.7 10*3/uL (ref 4.0–10.5)
nRBC: 0 % (ref 0.0–0.2)

## 2022-02-15 LAB — CMP (CANCER CENTER ONLY)
ALT: 19 U/L (ref 0–44)
AST: 15 U/L (ref 15–41)
Albumin: 3.7 g/dL (ref 3.5–5.0)
Alkaline Phosphatase: 108 U/L (ref 38–126)
Anion gap: 13 (ref 5–15)
BUN: 13 mg/dL (ref 8–23)
CO2: 29 mmol/L (ref 22–32)
Calcium: 9.4 mg/dL (ref 8.9–10.3)
Chloride: 95 mmol/L — ABNORMAL LOW (ref 98–111)
Creatinine: 0.95 mg/dL (ref 0.61–1.24)
GFR, Estimated: >60 mL/min (ref >60)
Glucose, Bld: 193 mg/dL — ABNORMAL HIGH (ref 70–99)
Potassium: 3.9 mmol/L (ref 3.5–5.1)
Sodium: 137 mmol/L (ref 135–145)
Total Bilirubin: 0.4 mg/dL (ref 0.3–1.2)
Total Protein: 6.8 g/dL (ref 6.5–8.1)

## 2022-02-15 MED ORDER — SODIUM CHLORIDE 0.9 % IV SOLN: Freq: Once | INTRAVENOUS | Status: AC

## 2022-02-15 MED ORDER — HEPARIN SOD (PORK) LOCK FLUSH 100 UNIT/ML IV SOLN
500.0000 [IU] | Freq: Once | INTRAVENOUS | Status: AC | PRN
  Administered 2022-02-15: 500 [IU]

## 2022-02-15 MED ORDER — SODIUM CHLORIDE 0.9% FLUSH
10.0000 mL | INTRAVENOUS | Status: DC | PRN
  Administered 2022-02-15: 10 mL

## 2022-02-15 MED ORDER — SODIUM CHLORIDE 0.9 % IV SOLN
200.0000 mg | Freq: Once | INTRAVENOUS | Status: AC
  Administered 2022-02-15: 200 mg via INTRAVENOUS
  Filled 2022-02-15: qty 8

## 2022-02-15 NOTE — Progress Notes (Signed)
Patient seen by Dr. Sherrill today ? ?Vitals are within treatment parameters. ? ?Labs reviewed by Dr. Sherrill and are within treatment parameters. ? ?Per physician team, patient is ready for treatment and there are NO modifications to the treatment plan.  ?

## 2022-02-15 NOTE — Patient Instructions (Signed)
[facility]   Discharge Instructions: Thank you for choosing [facility] to provide your oncology and hematology care.   If you have a lab appointment with the Cancer Center, please go directly to the Cancer Center and check in at the registration area.   Wear comfortable clothing and clothing appropriate for easy access to any Portacath or PICC line.   We strive to give you quality time with your provider. You may need to reschedule your appointment if you arrive late (15 or more minutes).  Arriving late affects you and other patients whose appointments are after yours.  Also, if you miss three or more appointments without notifying the office, you may be dismissed from the clinic at the provider's discretion.      For prescription refill requests, have your pharmacy contact our office and allow 72 hours for refills to be completed.    Today you received the following chemotherapy and/or immunotherapy agents Pembrolizumab (KEYTRUDA).      To help prevent nausea and vomiting after your treatment, we encourage you to take your nausea medication as directed.  BELOW ARE SYMPTOMS THAT SHOULD BE REPORTED IMMEDIATELY: *FEVER GREATER THAN 100.4 F (38 C) OR HIGHER *CHILLS OR SWEATING *NAUSEA AND VOMITING THAT IS NOT CONTROLLED WITH YOUR NAUSEA MEDICATION *UNUSUAL SHORTNESS OF BREATH *UNUSUAL BRUISING OR BLEEDING *URINARY PROBLEMS (pain or burning when urinating, or frequent urination) *BOWEL PROBLEMS (unusual diarrhea, constipation, pain near the anus) TENDERNESS IN MOUTH AND THROAT WITH OR WITHOUT PRESENCE OF ULCERS (sore throat, sores in mouth, or a toothache) UNUSUAL RASH, SWELLING OR PAIN  UNUSUAL VAGINAL DISCHARGE OR ITCHING   Items with * indicate a potential emergency and should be followed up as soon as possible or go to the Emergency Department if any problems should occur.  Please show the CHEMOTHERAPY ALERT CARD or IMMUNOTHERAPY ALERT CARD  at check-in to the Emergency Department and triage nurse.  Should you have questions after your visit or need to cancel or reschedule your appointment, please contact [facility]  Dept: [phone]  and follow the prompts.  Office hours are 8:00 a.m. to 4:30 p.m. Monday - Friday. Please note that voicemails left after 4:00 p.m. may not be returned until the following business day.  We are closed weekends and major holidays. You have access to a nurse at all times for urgent questions. Please call the main number to the clinic Dept: [phone] and follow the prompts.   For any non-urgent questions, you may also contact your provider using MyChart. We now offer e-Visits for anyone 18 and older to request care online for non-urgent symptoms. For details visit [email or web address].   Also download the MyChart app! Go to the app store, search "MyChart", open the app, select [facility], and log in with your MyChart username and password.  Masks are optional in the cancer centers. If you would like for your care team to wear a mask while they are taking care of you, please let them know. You may have one support person who is at least 77 years old accompany you for your appointments.  Pembrolizumab Injection What is this medication? PEMBROLIZUMAB (PEM broe LIZ ue mab) treats some types of cancer. It works by helping your immune system slow or stop the spread of cancer cells. It is a monoclonal antibody. This medicine may be used for other purposes; ask your health care provider or pharmacist if you have questions. COMMON BRAND NAME(S):   Keytruda What should I tell my care team before I take this medication? They need to know if you have any of these conditions: Allogeneic stem cell transplant (uses someone else's stem cells) Autoimmune diseases, such as Crohn disease, ulcerative colitis, lupus History of chest radiation Nervous system problems, such as Guillain-Barre  syndrome, myasthenia gravis Organ transplant An unusual or allergic reaction to pembrolizumab, other medications, foods, dyes, or preservatives Pregnant or trying to get pregnant Breast-feeding How should I use this medication? This medication is injected into a vein. It is given by your care team in a hospital or clinic setting. A special MedGuide will be given to you before each treatment. Be sure to read this information carefully each time. Talk to your care team about the use of this medication in children. While it may be prescribed for children as young as 6 months for selected conditions, precautions do apply. Overdosage: If you think you have taken too much of this medicine contact a poison control center or emergency room at once. NOTE: This medicine is only for you. Do not share this medicine with others. What if I miss a dose? Keep appointments for follow-up doses. It is important not to miss your dose. Call your care team if you are unable to keep an appointment. What may interact with this medication? Interactions have not been studied. This list may not describe all possible interactions. Give your health care provider a list of all the medicines, herbs, non-prescription drugs, or dietary supplements you use. Also tell them if you smoke, drink alcohol, or use illegal drugs. Some items may interact with your medicine. What should I watch for while using this medication? Your condition will be monitored carefully while you are receiving this medication. You may need blood work while taking this medication. This medication may cause serious skin reactions. They can happen weeks to months after starting the medication. Contact your care team right away if you notice fevers or flu-like symptoms with a rash. The rash may be red or purple and then turn into blisters or peeling of the skin. You may also notice a red rash with swelling of the face, lips, or lymph nodes in your neck or under  your arms. Tell your care team right away if you have any change in your eyesight. Talk to your care team if you may be pregnant. Serious birth defects can occur if you take this medication during pregnancy and for 4 months after the last dose. You will need a negative pregnancy test before starting this medication. Contraception is recommended while taking this medication and for 4 months after the last dose. Your care team can help you find the option that works for you. Do not breastfeed while taking this medication and for 4 months after the last dose. What side effects may I notice from receiving this medication? Side effects that you should report to your care team as soon as possible: Allergic reactions--skin rash, itching, hives, swelling of the face, lips, tongue, or throat Dry cough, shortness of breath or trouble breathing Eye pain, redness, irritation, or discharge with blurry or decreased vision Heart muscle inflammation--unusual weakness or fatigue, shortness of breath, chest pain, fast or irregular heartbeat, dizziness, swelling of the ankles, feet, or hands Hormone gland problems--headache, sensitivity to light, unusual weakness or fatigue, dizziness, fast or irregular heartbeat, increased sensitivity to cold or heat, excessive sweating, constipation, hair loss, increased thirst or amount of urine, tremors or shaking, irritability Infusion reactions--chest pain, shortness   of breath or trouble breathing, feeling faint or lightheaded Kidney injury (glomerulonephritis)--decrease in the amount of urine, red or dark brown urine, foamy or bubbly urine, swelling of the ankles, hands, or feet Liver injury--right upper belly pain, loss of appetite, nausea, light-colored stool, dark yellow or brown urine, yellowing skin or eyes, unusual weakness or fatigue Pain, tingling, or numbness in the hands or feet, muscle weakness, change in vision, confusion or trouble speaking, loss of balance or  coordination, trouble walking, seizures Rash, fever, and swollen lymph nodes Redness, blistering, peeling, or loosening of the skin, including inside the mouth Sudden or severe stomach pain, bloody diarrhea, fever, nausea, vomiting Side effects that usually do not require medical attention (report to your care team if they continue or are bothersome): Bone, joint, or muscle pain Diarrhea Fatigue Loss of appetite Nausea Skin rash This list may not describe all possible side effects. Call your doctor for medical advice about side effects. You may report side effects to FDA at 1-800-FDA-1088. Where should I keep my medication? This medication is given in a hospital or clinic. It will not be stored at home. NOTE: This sheet is a summary. It may not cover all possible information. If you have questions about this medicine, talk to your doctor, pharmacist, or health care provider.  2023 Elsevier/Gold Standard (2012-11-03 00:00:00)  

## 2022-02-15 NOTE — Progress Notes (Signed)
[facility] OFFICE PROGRESS NOTE   Diagnosis: Renal cell carcinoma  INTERVAL HISTORY:   Phillip [name] was admitted with acute cholecystitis/sepsis on 01/08/2022.  He was treated with antibiotics and placement of a percutaneous cholecystostomy drain.  He is here today with his wife.  She reports his clinical status has improved significantly.  No abdominal pain.  He is ambulating with less assistance.  Leg edema has improved.  He has a poor appetite.  Objective:  Vital signs in last 24 hours:  Blood pressure 127/81, pulse 84, temperature 98.1 F (36.7 C), temperature source Oral, resp. rate 18, height '5\' 11"'$  (1.803 m), weight 226 lb (102.5 kg), SpO2 98 %.    HEENT: No thrush or ulcers Resp: Clear bilaterally Cardio: Regular rate and rhythm GI: No hepatosplenomegaly, right upper quadrant cholecystostomy tube site without evidence of infection Vascular: No leg edema Neuro:, Ambulated to the exam table    Portacath/PICC-without erythema  Lab Results:  Lab Results  Component Value Date   WBC 9.7 02/15/2022   HGB 11.3 (L) 02/15/2022   HCT 36.9 (L) 02/15/2022   MCV 84.8 02/15/2022   PLT 290 02/15/2022   NEUTROABS 6.2 02/15/2022    CMP  Lab Results  Component Value Date   NA 137 02/15/2022   K 3.9 02/15/2022   CL 95 (L) 02/15/2022   CO2 29 02/15/2022   GLUCOSE 193 (H) 02/15/2022   BUN 13 02/15/2022   CREATININE 0.95 02/15/2022   CALCIUM 9.4 02/15/2022   PROT 6.8 02/15/2022   ALBUMIN 3.7 02/15/2022   AST 15 02/15/2022   ALT 19 02/15/2022   ALKPHOS 108 02/15/2022   BILITOT 0.4 02/15/2022   GFRNONAA >60 02/15/2022   GFRAA >60 11/05/2019    Lab Results  Component Value Date   CEA1 1.77 01/04/2016    Lab Results  Component Value Date   INR 1.3 (H) 01/09/2022   LABPROT 15.9 (H) 01/09/2022    Imaging:  No results found.  Medications: I have reviewed the patient's current medications.   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, L4 nerve compression Biopsy of the L4 mass 11/18/2015 confirmed metastatic renal cell carcinoma, clear cell type CTs of the chest, abdomen, and pelvis 11/18/2015-right lower lobe nodule, expansile lytic lesion at the right 11th rib/costal vertebral junction, left renal mass, expansile lesion involving the L4 vertebra, lytic lesion at the left acetabulum, and a low-attenuation liver lesion Initiation of SRS to L4 12/02/2015, Completed 12/12/2015 Initiation of Pazopanib 12/30/2015 Pazopanib placed on hold 02/06/2016 secondary to elevated liver enzymes Pazopanib resumed 03/07/2016 at a dose of 400 mg daily  Pazopanib discontinued 03/19/2016 secondary to elevated liver enzymes Restaging CTs 04/02/2016-stable left renal mass, decreased soft tissue component associated with the L4 metastasis, increased soft tissue component associated with the right 11th rib metastasis with increased T11 bony destruction, increased sclerosis at the left acetabulum lesion Cycle 1 nivolumab 04/12/2016 Cycle 2 nivolumab 04/26/2016 Cycle 3 nivolumab 05/11/2016 Cycle 4 nivolumab 05/24/2016 Cycle 5 nivolumab 06/08/2016 MRI lumbar spine 06/21/2016-unchanged tumor at L3, increased size of retroperitoneal lymph nodes compared to a CT from 04/02/2016 Cycle 6 nivolumab  06/22/2016 CTs chest, abdomen, and pelvis 07/04/2016-enlargement of the left renal mass, right adrenal nodule, left hilar and peritoneal lymph nodes, enlargement of left acetabular lesion. Stable lung nodules. Cycle 7 nivolumab 07/06/2016 Cycle 8 nivolumab 07/20/2016 Cycle 9 nivolumab 08/02/2016 Cycle 10 nivolumab 08/20/2016 Restaging CT 09/03/2016 evaluation with stable disease Cycle 11 nivolumab 09/05/2016 Cycle 12  nivolumab 09/19/2016 Cycle 13 nivolumab 10/03/2016 Cycle 14 nivolumab 10/17/2016 Cycle 15 nivolumab 10/31/2016 Cycle 16 nivolumab 11/14/2016 (changed to monthly schedule) Cycle 17 nivolumab 12/19/2016 CTs  01/21/2017-increased left renal mass, increased size of adrenal metastases, increased lytic bone lesions, increased left lung nodule, persistent tumor at L4 with probable epidural component Initiation of Cabozantinib 01/28/2017 Restaging CTs 05/30/2017- decreased size of left hilar mass, left renal mass, retroperitoneal adenopathy, and adrenal metastasis.  Healing bone lesions. Cabozantinib continued CTs 10/21/2017- interval enlargement left hilar lymph node; stable rib lesions; stable mass left renal cortex; stable mildly nodular adrenal glands; stable lytic lesions within the pelvis and spine. Cabozantinib continued CTs 02/21/2018- enlargement of an AP window lymph node.  Mildly enlarged left hilar lymph node is unchanged.  Primary renal cell carcinoma involving the upper pole of the left kidney appears similar.  Stable enlarged right periaortic lymph node adjacent to the renal vessels.  Stable multifocal bony metastatic disease. Cabozantinib continued CTs 07/29/2018- stable 15 mm AP window nodes; stable left hilar node; subcarinal node slightly larger; right periaortic node 16 mm, previously 12 mm; portacaval node 24 mm, previously 16 mm; 15 mm node superior to the pancreatic head has enlarged; interval increase nodularity of both adrenal glands; stable left kidney mass; multifocal bony metastatic disease not significantly changed. Cabozantinib continued CTs 11/06/2018- moderate improvement in thoracic adenopathy; minimal improvement abdominal adenopathy; left kidney upper pole mass and various lytic expansile bone lesions stable; mild increase in nodularity of left adrenal gland; right adrenal gland nodularity stable. 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  Peripheral enhancing masses consistent with metastases. No hemorrhage. MRI brain 03/11/2019-7 enhancing brain masses consistent with  metastatic disease SRS to 7 brain lesions, treatment given 03/19/2019, 03/23/2019, and 03/25/2019 CTs 04/09/2019-decrease in left renal mass, bilateral adrenal nodules, AP window and porta hepatic adenopathy.  New 9 mm right lower lobe nodule.  Improved lytic lesion of the left acetabulum.  Other bone lesions are stable. Cabozantinib continued MRI brain 07/17/2019-resolution of 4 mm treated lesion in the right frontal cortex, 6 remaining treated lesions have decreased in size, new punctate metastasis in the superior right cerebellum SRS to right cerebellar lesion 07/30/2019 CTs 08/17/2019-previously noted right lower lobe nodule resolved, stable left kidney mass, mixed lytic/sclerotic bone lesions in the thoracolumbar spine, right posterior ribs, and left acetabulum-unchanged, no evidence of progressive disease  Cabozantinib continued MRI brain 10/23/2019-stable to slight decrease in size of multiple enhancing intracranial lesions.  Slight increase in surrounding edema in the medial left frontal lobe.  No new lesions present. MRI brain 01/29/2020-multiple enhancing brain lesions, some hemorrhagic, some with mild enlargement-treatment effect? CTs 02/08/2020-no thoracic metastases, enlargement of the left renal mass, enlargement of lytic lesion at T9, other lytic lesions unchanged MRI brain 05/04/2020-no new lesions, slight enlargement of a right occipital lesion, other lesions are stable or decreased in size CTs 08/11/2020-enlargement of left kidney mass, new 1.6 cm segment 7 liver lesion, enlargement of a lytic lesion at T9, increased lysis of a sclerotic lesion at the right second rib, 0.7 cm endoluminal nodule at the bladder dome-enlarged Brain MRI 08/10/2020-slight increase in a 15 mm left frontal lobe lesion, new punctate focus in the right occipital cortex, other lesions stable or slightly decreased CTs 11/01/2020-increase in left suprahilar opacity, enlargement of previous liver metastases, several new  subcentimeter lesions, increase in left renal mass, similar appearance of bone metastases, stable hyperdense/enhancing nodule in the left bladder  Brain MRI 11/04/2020-no change in brain metastases Lenvatinib/pembrolizumab 11/30/2020 CTs 03/03/2021-stable left hilar lymph nodes; resolution of left upper lobe perihilar nodularity; unchanged for millimeter peripheral right upper lobe nodule; multiple lytic bone lesions appear stable; left kidney lesion decreased in size; bilateral adrenal nodules decreased in size; several liver lesion showed mild increase in size; several new small lesions within the right hepatic lobe. Lenvatinib/pembrolizumab continued 03/07/2021 Brain MRI 03/10/2021-progression of a left frontal metastasis, tiny new metastasis in the right frontal lobe-referred for SBRT to the right frontal lesion and to [facility] to consider a LITT procedure for the progressive left frontal lesion SRS 04/06/2021, LITT procedure at [facility] 04/18/2021-pathology metastatic renal cell carcinoma in background of necrosis/gliosis Lenvatinib on hold Pembrolizumab held 04/24/2021 Pembrolizumab 05/01/2021, lenvatinib remains on hold pending surgical evaluation Pembrolizumab 05/25/2021, lenvatinib resumed Pembrolizumab 06/15/2021 Lenvima resumed 06/21/2021 CTs 07/28/2021-continued regression left renal lesion.  Much improved appearance of the liver.  Continued regression of bilateral adrenal gland nodules.  Stable mixed lytic and sclerotic metastatic bone disease.  No new or progressive findings. Pembrolizumab 07/31/2021, lenvatinib continued Pembrolizumab held 08/21/2021 due to generalized weakness Pembrolizumab resumed 09/11/2021 MRI brain 10/19/2021-"new "focus of minimal enhancement of the right frontal lobe, larger punctate right cerebellar lesion, review of MRI found the right frontal lesion was present in 2021 and was treated with SRS CTs 11/12/2021-2 new right upper lung nodules, stable left renal lesion, enlarging  lytic lesion in the left fourth rib Pembrolizumab and lenvatinib continued Brain 01/02/2022-new 4 mm lesion in the right parietal cortex the largest lesion in left frontal lobe stable in size with more solid  CTs chest, abdomen, and pelvis-increased soft tissue component of a lytic left acetabular Tasso cysts and increased sclerotic component of the left fourth rib metastasis, decreased conspicuity of right upper lobe nodules, no change in renal mass Pembrolizumab and lenvatinib continue   Pain secondary to #1-managed by Dr. Lovenia Shuck.  Improved Hypertension Elevated transaminases 02/06/2016- Pazopanib placed on hold Liver enzymes normal 03/07/2016 Port-A-Cath placement 05/16/2016 Malaise/anorexia 09/05/2016. Cortisol and testosterone levels low. Hydrocortisone and testosterone replacement initiated. Conjunctival/scleral erythema 09/19/2016-resolved with steroid eyedrops Proximal right leg weakness. 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 Hypercalcemia status post Zometa 01/23/2017-resolved Pruritic rash following IV contrast 10/21/2017; rash following IV contrast 02/21/2018 despite prednisone/Benadryl premedication Brief episodes of expressive aphasia October and December 2020 Vitamin B12 deficiency confirmed on lab 03/07/2021-started oral vitamin B12 replacement 03/28/2021 Admission 01/08/2022 with acute cholecystitis/sepsis, cholecystostomy tube       Disposition: Phillip [name] was admitted last month with acute cholecystitis.  Cholecystostomy tube is in place.  He is scheduled to follow-up with Dr. Ninfa Linden in a few weeks to decide on removing the cholecystostomy tube and performing a cholecystectomy. There is no clinical evidence for progression of the metastatic renal cell carcinoma.  He will follow-up with Dr. Mickeal Skinner to decide on treating the small new frontal metastasis.  His overall clinical status is stable to improved.  The CTs during the recent hospital  admission reveal overall stable disease.  I recommend continuing pembrolizumab and lenvatinib.  He will complete another cycle of pembrolizumab today.  Phillip [name] will return for an office visit in 3 weeks.  Betsy Coder, MD  02/15/2022  12:48 PM

## 2022-02-16 ENCOUNTER — Other Ambulatory Visit: Payer: Self-pay

## 2022-02-18 ENCOUNTER — Other Ambulatory Visit: Payer: Self-pay

## 2022-02-20 ENCOUNTER — Other Ambulatory Visit: Payer: Self-pay

## 2022-02-21 ENCOUNTER — Inpatient Hospital Stay (HOSPITAL_COMMUNITY): Admit: 2022-02-21 | Payer: PPO

## 2022-02-23 ENCOUNTER — Other Ambulatory Visit: Payer: Self-pay

## 2022-02-28 ENCOUNTER — Other Ambulatory Visit: Payer: Self-pay | Admitting: Nurse Practitioner

## 2022-02-28 ENCOUNTER — Other Ambulatory Visit (HOSPITAL_COMMUNITY): Payer: Self-pay

## 2022-02-28 ENCOUNTER — Other Ambulatory Visit: Payer: Self-pay | Admitting: Oncology

## 2022-02-28 DIAGNOSIS — C649 Malignant neoplasm of unspecified kidney, except renal pelvis: Secondary | ICD-10-CM

## 2022-02-28 MED ORDER — OXYCODONE HCL 5 MG PO TABS
5.0000 mg | ORAL_TABLET | Freq: Four times a day (QID) | ORAL | 0 refills | Status: DC | PRN
  Filled 2022-02-28: qty 60, 15d supply, fill #0

## 2022-02-28 MED ORDER — LEVETIRACETAM 500 MG PO TABS
500.0000 mg | ORAL_TABLET | Freq: Two times a day (BID) | ORAL | 2 refills | Status: DC
  Filled 2022-02-28: qty 60, 30d supply, fill #0

## 2022-02-28 MED ORDER — HYDROCORTISONE 10 MG PO TABS
ORAL_TABLET | ORAL | 3 refills | Status: DC
  Filled 2022-02-28: qty 90, 30d supply, fill #0
  Filled 2022-03-28: qty 90, 30d supply, fill #1

## 2022-02-28 MED ORDER — OXYCODONE HCL ER 10 MG PO T12A
20.0000 mg | EXTENDED_RELEASE_TABLET | Freq: Two times a day (BID) | ORAL | 0 refills | Status: DC
  Filled 2022-02-28 – 2022-03-28 (×2): qty 120, 30d supply, fill #0

## 2022-03-01 ENCOUNTER — Other Ambulatory Visit (HOSPITAL_COMMUNITY): Payer: Self-pay

## 2022-03-01 ENCOUNTER — Other Ambulatory Visit: Payer: Self-pay | Admitting: Surgery

## 2022-03-01 DIAGNOSIS — K8010 Calculus of gallbladder with chronic cholecystitis without obstruction: Secondary | ICD-10-CM | POA: Diagnosis not present

## 2022-03-02 ENCOUNTER — Telehealth: Payer: Self-pay | Admitting: *Deleted

## 2022-03-02 NOTE — Telephone Encounter (Signed)
Informed Mrs. Gonet that according to oral oncology pharmacist, he will need to hold his lenvatinib 1 week preop and for 2 weeks postop. Ok to proceed w/Keytruda as scheduled.

## 2022-03-05 ENCOUNTER — Other Ambulatory Visit (HOSPITAL_COMMUNITY): Payer: Self-pay

## 2022-03-05 ENCOUNTER — Other Ambulatory Visit: Payer: Self-pay

## 2022-03-06 ENCOUNTER — Other Ambulatory Visit (HOSPITAL_COMMUNITY): Payer: Self-pay

## 2022-03-06 ENCOUNTER — Other Ambulatory Visit (HOSPITAL_BASED_OUTPATIENT_CLINIC_OR_DEPARTMENT_OTHER): Payer: Self-pay

## 2022-03-07 ENCOUNTER — Other Ambulatory Visit: Payer: Self-pay

## 2022-03-08 ENCOUNTER — Inpatient Hospital Stay: Payer: PPO | Admitting: Nurse Practitioner

## 2022-03-08 ENCOUNTER — Inpatient Hospital Stay: Payer: PPO

## 2022-03-09 ENCOUNTER — Encounter: Payer: Self-pay | Admitting: Internal Medicine

## 2022-03-12 ENCOUNTER — Other Ambulatory Visit (HOSPITAL_COMMUNITY): Payer: Self-pay

## 2022-03-12 ENCOUNTER — Encounter: Payer: Self-pay | Admitting: Internal Medicine

## 2022-03-13 ENCOUNTER — Encounter (HOSPITAL_COMMUNITY): Payer: Self-pay | Admitting: Surgery

## 2022-03-13 ENCOUNTER — Other Ambulatory Visit: Payer: Self-pay

## 2022-03-13 ENCOUNTER — Telehealth: Payer: Self-pay

## 2022-03-13 NOTE — Telephone Encounter (Signed)
Patient's spouse, [name], called to inform office that patient's brain MRI had been scheduled for 1/30 at 12:00 PM and wanted to schedule follow-up visit with Dr. Mickeal Skinner to review results. Appointment scheduled for Monday, 2/5 at 11:00 AM per [facility] request.  Appointment scheduled accordingly.

## 2022-03-13 NOTE — Pre-Procedure Instructions (Addendum)
PCP - Dr. [name] Cardiologist - Dr. Gasper Sells   PPM/ICD - denies   Chest x-ray - 01/08/22 EKG - 01/12/22 Stress Test - denies ECHO - 01/12/22 Cardiac Cath - denies  CPAP - denies  DM- denies  ASA/Blood Thinner Instructions: n/a   ERAS Protcol - clears until 0430  COVID TEST- n/a  Anesthesia review: yes, cardiac hx  Patient verbally denies any shortness of breath, fever, cough and chest pain during phone call   -------------  SDW INSTRUCTIONS given:  Your procedure is scheduled on 1/18.  Report to [facility] Main Entrance "A" at 0530 A.M., and check in at the Admitting office.  Call this number if you have problems the morning of surgery:  [phone]   Remember:  Do not eat after midnight the night before your surgery  You may drink clear liquids until 04:30 AM the morning of your surgery.   Clear liquids allowed are: Water, Non-Citrus Juices (without pulp), Carbonated Beverages, Clear Tea, Black Coffee Only, and Gatorade    Take these medicines the morning of surgery with A SIP OF WATER  dorzolamide (TRUSOPT)  hydrocortisone (CORTEF)  lenvatinib 18 mg daily dose (LENVIMA, 18 MG DAILY DOSE,)  levETIRAcetam (KEPPRA)  ondansetron (ZOFRAN) PRN oxyCODONE (OXY IR/ROXICODONE) PRN oxyCODONE (OXYCONTIN)  saccharomyces boulardii (FLORASTOR)   As of today, STOP taking any Aspirin (unless otherwise instructed by your surgeon) Aleve, Naproxen, Ibuprofen, Motrin, Advil, Goody's, BC's, all herbal medications, fish oil, and all vitamins.                      Do not wear jewelry, make up, or nail polish            Do not wear lotions, powders, colognes, or deodorant.            Men may shave face and neck.            Do not bring valuables to the hospital.            [facility] is not responsible for any belongings or valuables.  Do NOT Smoke (Tobacco/Vaping) 24 hours prior to your procedure If you use a CPAP at night, you may bring all equipment for your  overnight stay.   Contacts, glasses, dentures or bridgework may not be worn into surgery.      For patients admitted to the hospital, discharge time will be determined by your treatment team.   Patients discharged the day of surgery will not be allowed to drive home, and someone needs to stay with them for 24 hours.    Special instructions:   [place]- Preparing For Surgery  Before surgery, you can play an important role. Because skin is not sterile, your skin needs to be as free of germs as possible. You can reduce the number of germs on your skin by washing with CHG (chlorahexidine gluconate) Soap before surgery.  CHG is an antiseptic cleaner which kills germs and bonds with the skin to continue killing germs even after washing.    Oral Hygiene is also important to reduce your risk of infection.  Remember - BRUSH YOUR TEETH THE MORNING OF SURGERY WITH YOUR REGULAR TOOTHPASTE  Please do not use if you have an allergy to CHG or antibacterial soaps. If your skin becomes reddened/irritated stop using the CHG.  Do not shave (including legs and underarms) for at least 48 hours prior to first CHG shower. It is OK to shave your face.  Please  follow these instructions carefully.   Shower the NIGHT BEFORE SURGERY and the MORNING OF SURGERY with DIAL Soap.   Pat yourself dry with a CLEAN TOWEL.  Wear CLEAN PAJAMAS to bed the night before surgery  Place CLEAN SHEETS on your bed the night of your first shower and DO NOT SLEEP WITH PETS.   Day of Surgery: Please shower morning of surgery  Wear Clean/Comfortable clothing the morning of surgery Do not apply any deodorants/lotions.   Remember to brush your teeth WITH YOUR REGULAR TOOTHPASTE.   Questions were answered. Patient verbalized understanding of instructions.

## 2022-03-14 ENCOUNTER — Other Ambulatory Visit: Payer: Self-pay

## 2022-03-14 ENCOUNTER — Other Ambulatory Visit (HOSPITAL_COMMUNITY): Payer: Self-pay

## 2022-03-14 NOTE — Anesthesia Preprocedure Evaluation (Addendum)
Anesthesia Evaluation  Patient identified by MRN, date of birth, ID band Patient awake    Reviewed: Allergy & Precautions, NPO status , Patient's Chart, lab work & pertinent test results  History of Anesthesia Complications Negative for: history of anesthetic complications  Airway Mallampati: I  TM Distance: >3 FB Neck ROM: Full    Dental  (+) Edentulous Upper, Edentulous Lower, Dental Advisory Given   Pulmonary neg pulmonary ROS   Pulmonary exam normal        Cardiovascular hypertension, Pt. on medications Normal cardiovascular exam  IMPRESSIONS     1. Left ventricular ejection fraction, by estimation, is 60 to 65%. The  left ventricle has normal function. The left ventricle has no regional  wall motion abnormalities. There is mild concentric left ventricular  hypertrophy. Left ventricular diastolic  parameters were normal.   2. Right ventricular systolic function is normal. The right ventricular  size is normal. Tricuspid regurgitation signal is inadequate for assessing  PA pressure.   3. The mitral valve is grossly normal. Trivial mitral valve  regurgitation. No evidence of mitral stenosis.   4. The aortic valve is tricuspid. There is mild calcification of the  aortic valve. Aortic valve regurgitation is not visualized. Aortic valve  sclerosis is present, with no evidence of aortic valve stenosis.   5. The inferior vena cava is normal in size with greater than 50%  respiratory variability, suggesting right atrial pressure of 3 mmHg.      Neuro/Psych Seizures -, Well Controlled,  PSYCHIATRIC DISORDERS Anxiety Depression    Brain met    GI/Hepatic negative GI ROS, Neg liver ROS,,,  Endo/Other  negative endocrine ROS    Renal/GU negative Renal ROSRenal Cell Ca     Musculoskeletal negative musculoskeletal ROS (+)    Abdominal   Peds  Hematology negative hematology ROS (+)   Anesthesia Other Findings    Reproductive/Obstetrics                             Anesthesia Physical Anesthesia Plan  ASA: 3  Anesthesia Plan: General   Post-op Pain Management: Tylenol PO (pre-op)* and Toradol IV (intra-op)*   Induction: Intravenous  PONV Risk Score and Plan: 4 or greater and Ondansetron, Dexamethasone and Diphenhydramine  Airway Management Planned: Oral ETT  Additional Equipment:   Intra-op Plan:   Post-operative Plan: Extubation in OR  Informed Consent: I have reviewed the patients History and Physical, chart, labs and discussed the procedure including the risks, benefits and alternatives for the proposed anesthesia with the patient or authorized representative who has indicated his/her understanding and acceptance.     Dental advisory given  Plan Discussed with: Anesthesiologist, CRNA and Surgeon  Anesthesia Plan Comments:        Anesthesia Quick Evaluation

## 2022-03-14 NOTE — H&P (Signed)
PROVIDER: Beverlee Nims, MD  MRN: [id number] DOB: [birth year] DATE OF ENCOUNTER: 03/01/2022 Subjective  Chief Complaint: Follow-up (Perc drain, f/u appt per KJ, PA/)   History of Present Illness: Phillip [name] is a 77 y.o. male who is seen today for a long-term hospital follow-up. He has a history of metastatic renal cell cancer. He was admitted with acute cholecystitis in November and underwent a percutaneous cholecystostomy tube placement. He still has a drain in place and has improved significantly and now is here today to consider cholecystectomy. He is being followed closely at the cancer center by Dr. [name]. He is here with his wife. He has minimal abdominal pain. He reports the drain is working well. He has no fevers or chills. His appetite is still moderately poor.    Review of Systems: A complete review of systems was obtained from the patient. I have reviewed this information and discussed as appropriate with the patient. See HPI as well for other ROS.  ROS  Medical History: Past Medical History: Diagnosis Date Anemia Chronic kidney disease History of cancer Hypertension Seizures (CMS-[name])  There is no problem list on file for this patient.  History reviewed. No pertinent surgical history.  Allergies Allergen Reactions Iodinated Contrast Media Rash  Current Outpatient Medications on File Prior to Visit Medication Sig Dispense Refill acetaminophen (TYLENOL) 650 MG ER tablet Take 650 mg by mouth every 8 (eight) hours as needed cyanocobalamin (VITAMIN B12) 1000 MCG tablet Take by mouth famotidine (PEPCID) 20 MG tablet Take by mouth hydrocortisone (CORTEF) 10 MG tablet ibuprofen (MOTRIN) 200 MG tablet Take by mouth lenvatinib (LENVIMA) 18 mg (10 mg (1)- 4 mg (2)) kit HOLD for 2 weeks after surgery. If you have any questions about this, please contact your oncologist. levETIRAcetam (KEPPRA) 500 MG tablet lidocaine-prilocaine (EMLA)  cream losartan-hydrochlorothiazide (HYZAAR) 100-12.5 mg tablet 1 tablet mirtazapine (REMERON) 30 MG tablet multivitamin capsule Take 1 capsule by mouth once daily ondansetron (ZOFRAN) 8 MG tablet oxyCODONE (ROXICODONE) 5 MG immediate release tablet polyethylene glycol (MIRALAX) powder Take by mouth predniSONE (DELTASONE) 50 MG tablet prochlorperazine (COMPAZINE) 10 MG tablet testosterone (ANDROGEL) 20.25 mg/1.25 gram (1.62 %) gel in metered dose pump Apply 2 pumps topically daily  No current facility-administered medications on file prior to visit.  Family History Problem Relation Age of Onset Obesity Mother High blood pressure (Hypertension) Mother Diabetes Mother Myocardial Infarction (Heart attack) Mother   Social History  Tobacco Use Smoking Status Former Types: Cigarettes Smokeless Tobacco Never   Social History  Socioeconomic History Marital status: Married Tobacco Use Smoking status: Former Types: Cigarettes Smokeless tobacco: Never  Objective:  Vitals: 03/01/22 1031 PainSc: 4 PainLoc: Leg  There is no height or weight on file to calculate BMI.  Physical Exam  He is awake and alert  He is walking with a walker  His abdomen is soft and nontender. The right upper quadrant drain is draining bile  Labs, Imaging and Diagnostic Testing:  I have reviewed the notes from the cancer center  Assessment and Plan:  Diagnoses and all orders for this visit:  Calculus of gallbladder with chronic cholecystitis without obstruction    At this point, he is a candidate for laparoscopic cholecystectomy. I discussed this with him and his wife and they are eager to proceed with surgery. His drain will remain in place until the surgery. We discussed the procedure in detail. We discussed the risks which includes but is not limited to bleeding, infection, injury to surrounding structures, the need to convert  to an open procedure, bile duct injury, bile leak, the need  for other procedures, cardiopulmonary issues, DVT, etc.

## 2022-03-15 ENCOUNTER — Other Ambulatory Visit: Payer: Self-pay

## 2022-03-15 ENCOUNTER — Observation Stay (HOSPITAL_COMMUNITY)
Admission: RE | Admit: 2022-03-15 | Discharge: 2022-03-16 | Disposition: A | Discharge status: Home / Self Care | Payer: PPO | Attending: Surgery | Admitting: Surgery

## 2022-03-15 ENCOUNTER — Ambulatory Visit (HOSPITAL_COMMUNITY): Payer: PPO | Admitting: Anesthesiology

## 2022-03-15 ENCOUNTER — Encounter (HOSPITAL_COMMUNITY): Admission: RE | Disposition: A | Discharge status: Home / Self Care | Payer: Self-pay | Source: Home / Self Care

## 2022-03-15 ENCOUNTER — Ambulatory Visit (HOSPITAL_BASED_OUTPATIENT_CLINIC_OR_DEPARTMENT_OTHER): Payer: PPO | Admitting: Anesthesiology

## 2022-03-15 ENCOUNTER — Encounter (HOSPITAL_COMMUNITY): Payer: Self-pay | Admitting: Surgery

## 2022-03-15 DIAGNOSIS — K8010 Calculus of gallbladder with chronic cholecystitis without obstruction: Secondary | ICD-10-CM | POA: Diagnosis not present

## 2022-03-15 DIAGNOSIS — K8012 Calculus of gallbladder with acute and chronic cholecystitis without obstruction: Secondary | ICD-10-CM | POA: Diagnosis not present

## 2022-03-15 DIAGNOSIS — F418 Other specified anxiety disorders: Secondary | ICD-10-CM | POA: Diagnosis not present

## 2022-03-15 DIAGNOSIS — N189 Chronic kidney disease, unspecified: Secondary | ICD-10-CM | POA: Insufficient documentation

## 2022-03-15 DIAGNOSIS — R2689 Other abnormalities of gait and mobility: Secondary | ICD-10-CM | POA: Diagnosis not present

## 2022-03-15 DIAGNOSIS — I119 Hypertensive heart disease without heart failure: Secondary | ICD-10-CM

## 2022-03-15 DIAGNOSIS — Z87891 Personal history of nicotine dependence: Secondary | ICD-10-CM | POA: Diagnosis not present

## 2022-03-15 DIAGNOSIS — I129 Hypertensive chronic kidney disease with stage 1 through stage 4 chronic kidney disease, or unspecified chronic kidney disease: Secondary | ICD-10-CM | POA: Insufficient documentation

## 2022-03-15 DIAGNOSIS — M6281 Muscle weakness (generalized): Secondary | ICD-10-CM | POA: Insufficient documentation

## 2022-03-15 DIAGNOSIS — K828 Other specified diseases of gallbladder: Secondary | ICD-10-CM | POA: Diagnosis not present

## 2022-03-15 DIAGNOSIS — I351 Nonrheumatic aortic (valve) insufficiency: Secondary | ICD-10-CM | POA: Diagnosis not present

## 2022-03-15 DIAGNOSIS — Z9049 Acquired absence of other specified parts of digestive tract: Secondary | ICD-10-CM

## 2022-03-15 DIAGNOSIS — I10 Essential (primary) hypertension: Secondary | ICD-10-CM | POA: Diagnosis not present

## 2022-03-15 DIAGNOSIS — Z79899 Other long term (current) drug therapy: Secondary | ICD-10-CM | POA: Diagnosis not present

## 2022-03-15 DIAGNOSIS — K8066 Calculus of gallbladder and bile duct with acute and chronic cholecystitis without obstruction: Secondary | ICD-10-CM | POA: Diagnosis not present

## 2022-03-15 HISTORY — DX: Tachycardia, unspecified: R00.0

## 2022-03-15 HISTORY — DX: Anemia, unspecified: D64.9

## 2022-03-15 HISTORY — PX: CHOLECYSTECTOMY: SHX55

## 2022-03-15 HISTORY — DX: Unspecified osteoarthritis, unspecified site: M19.90

## 2022-03-15 SURGERY — LAPAROSCOPIC CHOLECYSTECTOMY
Anesthesia: General | Site: Abdomen

## 2022-03-15 MED ORDER — CHLORHEXIDINE GLUCONATE 0.12 % MT SOLN
OROMUCOSAL | Status: AC
  Administered 2022-03-15: 15 mL
  Filled 2022-03-15: qty 15

## 2022-03-15 MED ORDER — ONDANSETRON HCL 4 MG/2ML IJ SOLN: 4.0000 mg | Freq: Four times a day (QID) | INTRAMUSCULAR | Status: DC | PRN

## 2022-03-15 MED ORDER — ACETAMINOPHEN 500 MG PO TABS: 1000.0000 mg | ORAL_TABLET | ORAL | Status: AC

## 2022-03-15 MED ORDER — SODIUM CHLORIDE 0.9 % IR SOLN
Status: DC | PRN
  Administered 2022-03-15: 1000 mL

## 2022-03-15 MED ORDER — TRAMADOL HCL 50 MG PO TABS
ORAL_TABLET | ORAL | Status: AC
  Filled 2022-03-15: qty 1

## 2022-03-15 MED ORDER — HEMOSTATIC AGENTS (NO CHARGE) OPTIME
TOPICAL | Status: DC | PRN
  Administered 2022-03-15: 1

## 2022-03-15 MED ORDER — PROMETHAZINE HCL 25 MG/ML IJ SOLN: 6.2500 mg | INTRAMUSCULAR | Status: DC | PRN

## 2022-03-15 MED ORDER — AMISULPRIDE (ANTIEMETIC) 5 MG/2ML IV SOLN
10.0000 mg | Freq: Once | INTRAVENOUS | Status: AC | PRN
  Administered 2022-03-15: 10 mg via INTRAVENOUS

## 2022-03-15 MED ORDER — POTASSIUM CHLORIDE IN NACL 20-0.9 MEQ/L-% IV SOLN
INTRAVENOUS | Status: DC
  Filled 2022-03-15: qty 1000

## 2022-03-15 MED ORDER — BUPIVACAINE-EPINEPHRINE (PF) 0.5% -1:200000 IJ SOLN
INTRAMUSCULAR | Status: AC
  Filled 2022-03-15: qty 30

## 2022-03-15 MED ORDER — EPHEDRINE 5 MG/ML INJ
INTRAVENOUS | Status: AC
  Filled 2022-03-15: qty 5

## 2022-03-15 MED ORDER — LEVETIRACETAM 250 MG PO TABS
250.0000 mg | ORAL_TABLET | Freq: Two times a day (BID) | ORAL | Status: DC
  Administered 2022-03-15 – 2022-03-16 (×2): 250 mg via ORAL
  Filled 2022-03-15 (×3): qty 1

## 2022-03-15 MED ORDER — PROPOFOL 10 MG/ML IV BOLUS
INTRAVENOUS | Status: AC
  Filled 2022-03-15: qty 20

## 2022-03-15 MED ORDER — ONDANSETRON HCL 4 MG/2ML IJ SOLN
INTRAMUSCULAR | Status: AC
  Filled 2022-03-15: qty 2

## 2022-03-15 MED ORDER — SACCHAROMYCES BOULARDII 250 MG PO CAPS
250.0000 mg | ORAL_CAPSULE | Freq: Two times a day (BID) | ORAL | Status: DC
  Administered 2022-03-15 – 2022-03-16 (×2): 250 mg via ORAL
  Filled 2022-03-15 (×2): qty 1

## 2022-03-15 MED ORDER — OXYCODONE HCL 5 MG PO TABS
5.0000 mg | ORAL_TABLET | Freq: Four times a day (QID) | ORAL | Status: DC | PRN
  Administered 2022-03-15 – 2022-03-16 (×2): 5 mg via ORAL
  Filled 2022-03-15 (×2): qty 1

## 2022-03-15 MED ORDER — DEXAMETHASONE SODIUM PHOSPHATE 10 MG/ML IJ SOLN
INTRAMUSCULAR | Status: DC | PRN
  Administered 2022-03-15: 10 mg via INTRAVENOUS

## 2022-03-15 MED ORDER — AMISULPRIDE (ANTIEMETIC) 5 MG/2ML IV SOLN
INTRAVENOUS | Status: AC
  Filled 2022-03-15: qty 4

## 2022-03-15 MED ORDER — ENSURE PRE-SURGERY PO LIQD: 296.0000 mL | Freq: Once | ORAL | Status: DC

## 2022-03-15 MED ORDER — PHENYLEPHRINE 80 MCG/ML (10ML) SYRINGE FOR IV PUSH (FOR BLOOD PRESSURE SUPPORT)
PREFILLED_SYRINGE | INTRAVENOUS | Status: AC
  Filled 2022-03-15: qty 10

## 2022-03-15 MED ORDER — TRAMADOL HCL 50 MG PO TABS
50.0000 mg | ORAL_TABLET | Freq: Four times a day (QID) | ORAL | Status: DC | PRN
  Administered 2022-03-15: 50 mg via ORAL

## 2022-03-15 MED ORDER — ENOXAPARIN SODIUM 40 MG/0.4ML IJ SOSY
40.0000 mg | PREFILLED_SYRINGE | INTRAMUSCULAR | Status: DC
  Administered 2022-03-16: 40 mg via SUBCUTANEOUS
  Filled 2022-03-15: qty 0.4

## 2022-03-15 MED ORDER — BUPIVACAINE-EPINEPHRINE (PF) 0.5% -1:200000 IJ SOLN
INTRAMUSCULAR | Status: DC | PRN
  Administered 2022-03-15: 20 mL

## 2022-03-15 MED ORDER — ACETAMINOPHEN 500 MG PO TABS
ORAL_TABLET | ORAL | Status: AC
  Administered 2022-03-15: 1000 mg via ORAL
  Filled 2022-03-15: qty 2

## 2022-03-15 MED ORDER — CEFAZOLIN SODIUM-DEXTROSE 2-4 GM/100ML-% IV SOLN
INTRAVENOUS | Status: AC
  Filled 2022-03-15: qty 100

## 2022-03-15 MED ORDER — CHLORHEXIDINE GLUCONATE CLOTH 2 % EX PADS: 6.0000 | MEDICATED_PAD | Freq: Once | CUTANEOUS | Status: DC

## 2022-03-15 MED ORDER — FENTANYL CITRATE (PF) 250 MCG/5ML IJ SOLN
INTRAMUSCULAR | Status: AC
  Filled 2022-03-15: qty 5

## 2022-03-15 MED ORDER — ROCURONIUM BROMIDE 10 MG/ML (PF) SYRINGE
PREFILLED_SYRINGE | INTRAVENOUS | Status: DC | PRN
  Administered 2022-03-15: 60 mg via INTRAVENOUS

## 2022-03-15 MED ORDER — CEFAZOLIN SODIUM-DEXTROSE 2-4 GM/100ML-% IV SOLN
2.0000 g | INTRAVENOUS | Status: AC
  Administered 2022-03-15: 2 g via INTRAVENOUS

## 2022-03-15 MED ORDER — HYDROMORPHONE HCL 1 MG/ML IJ SOLN
INTRAMUSCULAR | Status: AC
  Filled 2022-03-15: qty 1

## 2022-03-15 MED ORDER — FENTANYL CITRATE (PF) 100 MCG/2ML IJ SOLN
25.0000 ug | INTRAMUSCULAR | Status: DC | PRN
  Administered 2022-03-15 (×4): 25 ug via INTRAVENOUS

## 2022-03-15 MED ORDER — ONDANSETRON 4 MG PO TBDP
4.0000 mg | ORAL_TABLET | Freq: Four times a day (QID) | ORAL | Status: DC | PRN
  Administered 2022-03-15: 4 mg via ORAL
  Filled 2022-03-15: qty 1

## 2022-03-15 MED ORDER — DEXAMETHASONE SODIUM PHOSPHATE 10 MG/ML IJ SOLN
INTRAMUSCULAR | Status: AC
  Filled 2022-03-15: qty 1

## 2022-03-15 MED ORDER — HYDROCHLOROTHIAZIDE 12.5 MG PO TABS
12.5000 mg | ORAL_TABLET | Freq: Every day | ORAL | Status: DC
  Administered 2022-03-16: 12.5 mg via ORAL
  Filled 2022-03-15: qty 1

## 2022-03-15 MED ORDER — METHOCARBAMOL 500 MG PO TABS
500.0000 mg | ORAL_TABLET | Freq: Four times a day (QID) | ORAL | Status: DC | PRN
  Administered 2022-03-15 – 2022-03-16 (×2): 500 mg via ORAL
  Filled 2022-03-15 (×2): qty 1

## 2022-03-15 MED ORDER — 0.9 % SODIUM CHLORIDE (POUR BTL) OPTIME
TOPICAL | Status: DC | PRN
  Administered 2022-03-15: 1000 mL

## 2022-03-15 MED ORDER — LOSARTAN POTASSIUM 50 MG PO TABS
100.0000 mg | ORAL_TABLET | Freq: Every day | ORAL | Status: DC
  Administered 2022-03-16: 100 mg via ORAL
  Filled 2022-03-15: qty 2

## 2022-03-15 MED ORDER — LIDOCAINE 2% (20 MG/ML) 5 ML SYRINGE
INTRAMUSCULAR | Status: AC
  Filled 2022-03-15: qty 5

## 2022-03-15 MED ORDER — PROPOFOL 10 MG/ML IV BOLUS
INTRAVENOUS | Status: DC | PRN
  Administered 2022-03-15: 130 mg via INTRAVENOUS

## 2022-03-15 MED ORDER — ALBUMIN HUMAN 5 % IV SOLN: INTRAVENOUS | Status: DC | PRN

## 2022-03-15 MED ORDER — DORZOLAMIDE HCL 2 % OP SOLN
1.0000 [drp] | Freq: Three times a day (TID) | OPHTHALMIC | Status: DC
  Administered 2022-03-15 – 2022-03-16 (×2): 1 [drp] via OPHTHALMIC
  Filled 2022-03-15: qty 10

## 2022-03-15 MED ORDER — PHENYLEPHRINE 80 MCG/ML (10ML) SYRINGE FOR IV PUSH (FOR BLOOD PRESSURE SUPPORT)
PREFILLED_SYRINGE | INTRAVENOUS | Status: DC | PRN
  Administered 2022-03-15: 160 ug via INTRAVENOUS
  Administered 2022-03-15: 80 ug via INTRAVENOUS

## 2022-03-15 MED ORDER — ROCURONIUM BROMIDE 10 MG/ML (PF) SYRINGE
PREFILLED_SYRINGE | INTRAVENOUS | Status: AC
  Filled 2022-03-15: qty 10

## 2022-03-15 MED ORDER — PHENYLEPHRINE HCL-NACL 20-0.9 MG/250ML-% IV SOLN
INTRAVENOUS | Status: DC | PRN
  Administered 2022-03-15: 40 ug/min via INTRAVENOUS

## 2022-03-15 MED ORDER — LACTATED RINGERS IV SOLN: INTRAVENOUS | Status: DC | PRN

## 2022-03-15 MED ORDER — SUGAMMADEX SODIUM 200 MG/2ML IV SOLN
INTRAVENOUS | Status: DC | PRN
  Administered 2022-03-15: 200 mg via INTRAVENOUS

## 2022-03-15 MED ORDER — EPHEDRINE SULFATE-NACL 50-0.9 MG/10ML-% IV SOSY
PREFILLED_SYRINGE | INTRAVENOUS | Status: DC | PRN
  Administered 2022-03-15 (×2): 5 mg via INTRAVENOUS

## 2022-03-15 MED ORDER — MIRTAZAPINE 30 MG PO TABS
30.0000 mg | ORAL_TABLET | Freq: Every day | ORAL | Status: DC
  Administered 2022-03-15: 30 mg via ORAL
  Filled 2022-03-15 (×2): qty 1

## 2022-03-15 MED ORDER — LOSARTAN POTASSIUM-HCTZ 100-12.5 MG PO TABS: 1.0000 | ORAL_TABLET | Freq: Every day | ORAL | Status: DC

## 2022-03-15 MED ORDER — ONDANSETRON HCL 4 MG/2ML IJ SOLN
INTRAMUSCULAR | Status: DC | PRN
  Administered 2022-03-15: 4 mg via INTRAVENOUS

## 2022-03-15 MED ORDER — HYDROMORPHONE HCL 1 MG/ML IJ SOLN
1.0000 mg | INTRAMUSCULAR | Status: DC | PRN
  Administered 2022-03-15: 1 mg via INTRAVENOUS

## 2022-03-15 MED ORDER — LIDOCAINE 2% (20 MG/ML) 5 ML SYRINGE
INTRAMUSCULAR | Status: DC | PRN
  Administered 2022-03-15: 60 mg via INTRAVENOUS

## 2022-03-15 MED ORDER — FENTANYL CITRATE (PF) 250 MCG/5ML IJ SOLN
INTRAMUSCULAR | Status: DC | PRN
  Administered 2022-03-15 (×3): 25 ug via INTRAVENOUS
  Administered 2022-03-15: 50 ug via INTRAVENOUS

## 2022-03-15 MED ORDER — FENTANYL CITRATE (PF) 100 MCG/2ML IJ SOLN
INTRAMUSCULAR | Status: AC
  Filled 2022-03-15: qty 2

## 2022-03-15 SURGICAL SUPPLY — 35 items
APPLIER CLIP 5 13 M/L LIGAMAX5 (MISCELLANEOUS) ×1
BAG COUNTER SPONGE SURGICOUNT (BAG) ×1 IMPLANT
CANISTER SUCT 3000ML PPV (MISCELLANEOUS) ×1 IMPLANT
CHLORAPREP W/TINT 26 (MISCELLANEOUS) ×1 IMPLANT
CLIP APPLIE 5 13 M/L LIGAMAX5 (MISCELLANEOUS) ×1 IMPLANT
COVER SURGICAL LIGHT HANDLE (MISCELLANEOUS) ×1 IMPLANT
DERMABOND ADVANCED .7 DNX12 (GAUZE/BANDAGES/DRESSINGS) ×1 IMPLANT
ELECT REM PT RETURN 9FT ADLT (ELECTROSURGICAL) ×1
ELECTRODE REM PT RTRN 9FT ADLT (ELECTROSURGICAL) ×1 IMPLANT
GLOVE SURG SIGNA 7.5 PF LTX (GLOVE) ×1 IMPLANT
GOWN STRL REUS W/ TWL LRG LVL3 (GOWN DISPOSABLE) ×2 IMPLANT
GOWN STRL REUS W/ TWL XL LVL3 (GOWN DISPOSABLE) ×1 IMPLANT
GOWN STRL REUS W/TWL LRG LVL3 (GOWN DISPOSABLE) ×2
GOWN STRL REUS W/TWL XL LVL3 (GOWN DISPOSABLE) ×1
HEMOSTAT SNOW SURGICEL 2X4 (HEMOSTASIS) IMPLANT
KIT BASIN OR (CUSTOM PROCEDURE TRAY) ×1 IMPLANT
KIT TURNOVER KIT B (KITS) ×1 IMPLANT
NS IRRIG 1000ML POUR BTL (IV SOLUTION) ×1 IMPLANT
PAD ARMBOARD 7.5X6 YLW CONV (MISCELLANEOUS) ×1 IMPLANT
POUCH RETRIEVAL ECOSAC 10 (ENDOMECHANICALS) IMPLANT
POUCH RETRIEVAL ECOSAC 10MM (ENDOMECHANICALS) ×1
POUCH SPECIMEN RETRIEVAL 10MM (ENDOMECHANICALS) ×1 IMPLANT
SCISSORS LAP 5X35 DISP (ENDOMECHANICALS) ×1 IMPLANT
SET IRRIG TUBING LAPAROSCOPIC (IRRIGATION / IRRIGATOR) ×1 IMPLANT
SET TUBE SMOKE EVAC HIGH FLOW (TUBING) ×1 IMPLANT
SLEEVE ENDOPATH XCEL 5M (ENDOMECHANICALS) ×2 IMPLANT
SPECIMEN JAR SMALL (MISCELLANEOUS) ×1 IMPLANT
SUT MNCRL AB 4-0 PS2 18 (SUTURE) ×1 IMPLANT
SUT VICRYL 0 UR6 27IN ABS (SUTURE) IMPLANT
TOWEL GREEN STERILE (TOWEL DISPOSABLE) ×1 IMPLANT
TOWEL GREEN STERILE FF (TOWEL DISPOSABLE) ×1 IMPLANT
TRAY LAPAROSCOPIC MC (CUSTOM PROCEDURE TRAY) ×1 IMPLANT
TROCAR XCEL BLUNT TIP 100MML (ENDOMECHANICALS) ×1 IMPLANT
TROCAR Z-THREAD OPTICAL 5X100M (TROCAR) ×1 IMPLANT
WATER STERILE IRR 1000ML POUR (IV SOLUTION) ×1 IMPLANT

## 2022-03-15 NOTE — Anesthesia Postprocedure Evaluation (Signed)
Anesthesia Post Note  Patient: Phillip [name]  Procedure(s) Performed: LAPAROSCOPIC CHOLECYSTECTOMY (Abdomen)     Patient location during evaluation: PACU Anesthesia Type: General Level of consciousness: sedated Pain management: pain level controlled Vital Signs Assessment: post-procedure vital signs reviewed and stable Respiratory status: spontaneous breathing and respiratory function stable Cardiovascular status: stable Postop Assessment: no apparent nausea or vomiting Anesthetic complications: no   No notable events documented.  Last Vitals:  Vitals:   03/15/22 0915 03/15/22 0930  BP: (!) 113/58 (!) 113/58  Pulse: 71 71  Resp: 13 13  Temp:  36.6 C  SpO2: 92% 93%    Last Pain:  Vitals:   03/15/22 0930  TempSrc:   PainSc: [place]

## 2022-03-15 NOTE — Anesthesia Procedure Notes (Signed)
Procedure Name: Intubation Date/Time: 03/15/2022 7:25 AM  Performed by: Kyung Rudd, CRNAPre-anesthesia Checklist: Patient identified, Emergency Drugs available, Suction available and Patient being monitored Patient Re-evaluated:Patient Re-evaluated prior to induction Oxygen Delivery Method: Circle system utilized Preoxygenation: Pre-oxygenation with 100% oxygen Induction Type: IV induction Ventilation: Mask ventilation without difficulty and Oral airway inserted - appropriate to patient size Laryngoscope Size: Mac and 4 Grade View: Grade I Tube type: Oral Tube size: 7.5 mm Number of attempts: 1 Airway Equipment and Method: Stylet Placement Confirmation: ETT inserted through vocal cords under direct vision, positive ETCO2 and breath sounds checked- equal and bilateral Secured at: 22 cm Tube secured with: Tape Dental Injury: Teeth and Oropharynx as per pre-operative assessment

## 2022-03-15 NOTE — Interval H&P Note (Signed)
History and Physical Interval Note: no change in H and P  03/15/2022 6:47 AM  Cloud  has presented today for surgery, with the diagnosis of [place].  The various methods of treatment have been discussed with the patient and family. After consideration of risks, benefits and other options for treatment, the patient has consented to  Procedure(s): LAPAROSCOPIC CHOLECYSTECTOMY (N/A) as a surgical intervention.  The patient's history has been reviewed, patient examined, no change in status, stable for surgery.  I have reviewed the patient's chart and labs.  Questions were answered to the patient's satisfaction.     Phillip [name]

## 2022-03-15 NOTE — Plan of Care (Signed)
Pt to room at 1346 from PACU, he is A&O, he is assisted to bed by PACU RN.  He is HOH, lungs CTA, RRR, bowel sounds are hypoactive, pt does have c/o pain to abdomen, 8/10.  NADN. Will continue to monitor. Sreece, RN

## 2022-03-15 NOTE — Plan of Care (Signed)
Pt external wick had leaked on bed.  Bed change/ bath completed with placement of new wick.  Increased suction to prevent additional wick failure.  Patient tolerated well. Edythe Clarity, RN

## 2022-03-15 NOTE — Op Note (Signed)
LAPAROSCOPIC CHOLECYSTECTOMY  Procedure Note  Phillip [name] 03/15/2022   Pre-op Diagnosis: CHRONIC CHOLECYSTITIS WITH GALLSTONES     Post-op Diagnosis: same  Procedure(s): LAPAROSCOPIC CHOLECYSTECTOMY  Surgeon(s): Coralie Keens, MD  Anesthesia: General  Staff:  Circulator: Margy Clarks, RN Scrub Person: Rozell Searing, RN; Amedeo Plenty, Amy E  Estimated Blood Loss: Minimal               Specimens: sent to path  Indications: This is a 77 year old gentleman with metastatic renal cell carcinoma who presented with acute cholecystitis in November of last year.  He ended up having a percutaneous cholecystostomy tube placed.  He is now stable and decision has been made to proceed with a laparoscopic cholecystectomy.  He still has the cholecystostomy drain in place.  He has been off of his chemotherapy for 1 week.  Procedure: The patient was brought to the operating identified as a correct patient.  He was placed upon the operating table and general anesthesia was induced.  His abdomen was prepped and draped in usual sterile fashion.  We have prepped and his drain as well.  I made a vertical incision above the umbilicus with a scalpel.  I carried this down to the fascia which was likewise opened the scalpel.  A Kelly clamp was then used to gain entrance of the peritoneal cavity under direct vision.  A 0 Vicryl pursing suture was placed around the fascial opening.  The [employer] port was placed through the opening and insufflation of the abdomen was begun.  I placed a 5 mm trocar in the patient's epigastrium and 2 more in the right upper quadrant both under direct vision.  The gallbladder was easily visualized and found to be thick-walled and chronically inflamed.  The drain was also visible as well.  I was able to grasp the gallbladder tract above the liver bed.  Several lesions were then taken down bluntly.  The patient had a large lymph node right at the edge of the gallbladder.  I had  to place surgical clips on the vessel feeding the lymph node and transected.  I was then able to identify the cystic duct and achieve a critical window around it.  I clipped 3 times proximally once distally and transected.  I then identified the cystic artery and a posterior branch which were clipped proximally distally and transected as well.  The gallbladder was then slowly dissected free from the liver bed with electrocautery.  Before was completely removed we removed the percutaneous cholecystostomy tube.  Hemostasis to be achieved in the liver bed.  I placed a piece of surgical snow and liver bed as well.  The gallbladder was then completely removed and placed in an Endosac and removed the incision at the umbilicus.  We then copiously irrigated the abdomen with saline.  States appeared to be achieved.  I then removed all ports under direct vision.  0 Vicryl the umbilicus tied in place closing the fascial defect.  I placed another 0 Vicryl figure-of-eight suture at the umbilicus fascia as well.  All incisions were then anesthetized Marcaine and closed with 4-0 Monocryl sutures and Dermabond.  The patient tolerated the procedure well.  All the counts were correct at the end of the procedure.  The patient was then extubated in the operating room and taken in stable condition to the recovery room.          Coralie Keens   Date: 03/15/2022  Time: 8:24 AM

## 2022-03-15 NOTE — Transfer of Care (Signed)
Immediate Anesthesia Transfer of Care Note  Patient: Phillip [name]  Procedure(s) Performed: LAPAROSCOPIC CHOLECYSTECTOMY (Abdomen)  Patient Location: PACU  Anesthesia Type:General  Level of Consciousness: awake, alert , and oriented  Airway & Oxygen Therapy: Patient Spontanous Breathing  Post-op Assessment: Report given to RN, Post -op Vital signs reviewed and stable, and Patient moving all extremities X 4  Post vital signs: Reviewed and stable  Last Vitals:  Vitals Value Taken Time  BP 123/62 03/15/22 0833  Temp    Pulse 73 03/15/22 0836  Resp 7 03/15/22 0835  SpO2 97 % 03/15/22 0836  Vitals shown include unvalidated device data.  Last Pain:  Vitals:   03/15/22 0633  TempSrc:   PainSc: 5       Patients Stated Pain Goal: 3 (84/83/50 7573)  Complications: No notable events documented.

## 2022-03-16 ENCOUNTER — Other Ambulatory Visit: Payer: Self-pay

## 2022-03-16 ENCOUNTER — Encounter (HOSPITAL_COMMUNITY): Payer: Self-pay | Admitting: Surgery

## 2022-03-16 ENCOUNTER — Other Ambulatory Visit (HOSPITAL_COMMUNITY): Payer: Self-pay

## 2022-03-16 DIAGNOSIS — K8012 Calculus of gallbladder with acute and chronic cholecystitis without obstruction: Secondary | ICD-10-CM | POA: Diagnosis not present

## 2022-03-16 LAB — COMPREHENSIVE METABOLIC PANEL
ALT: 26 U/L (ref 0–44)
AST: 29 U/L (ref 15–41)
Albumin: 2.5 g/dL — ABNORMAL LOW (ref 3.5–5.0)
Alkaline Phosphatase: 122 U/L (ref 38–126)
Anion gap: 10 (ref 5–15)
BUN: 14 mg/dL (ref 8–23)
CO2: 28 mmol/L (ref 22–32)
Calcium: 10.1 mg/dL (ref 8.9–10.3)
Chloride: 96 mmol/L — ABNORMAL LOW (ref 98–111)
Creatinine, Ser: 0.78 mg/dL (ref 0.61–1.24)
GFR, Estimated: >60 mL/min (ref >60)
Glucose, Bld: 161 mg/dL — ABNORMAL HIGH (ref 70–99)
Potassium: 4.2 mmol/L (ref 3.5–5.1)
Sodium: 134 mmol/L — ABNORMAL LOW (ref 135–145)
Total Bilirubin: 0.7 mg/dL (ref 0.3–1.2)
Total Protein: 6.2 g/dL — ABNORMAL LOW (ref 6.5–8.1)

## 2022-03-16 LAB — CBC
HCT: 31.8 % — ABNORMAL LOW (ref 39.0–52.0)
Hemoglobin: 10.3 g/dL — ABNORMAL LOW (ref 13.0–17.0)
MCH: 26.4 pg (ref 26.0–34.0)
MCHC: 32.4 g/dL (ref 30.0–36.0)
MCV: 81.5 fL (ref 80.0–100.0)
Platelets: 410 10*3/uL — ABNORMAL HIGH (ref 150–400)
RBC: 3.9 MIL/uL — ABNORMAL LOW (ref 4.22–5.81)
RDW: 18.2 % — ABNORMAL HIGH (ref 11.5–15.5)
WBC: 14.8 10*3/uL — ABNORMAL HIGH (ref 4.0–10.5)
nRBC: 0 % (ref 0.0–0.2)

## 2022-03-16 MED ORDER — OXYCODONE HCL ER 10 MG PO T12A
20.0000 mg | EXTENDED_RELEASE_TABLET | Freq: Two times a day (BID) | ORAL | Status: DC
  Administered 2022-03-16: 20 mg via ORAL
  Filled 2022-03-16: qty 2

## 2022-03-16 NOTE — Evaluation (Signed)
Physical Therapy Evaluation Patient Details Name: Phillip [name] MRN: [id number] DOB: 1945-03-27 Today's Date: 03/16/2022  History of Present Illness  Pt is 77 yo admitted 03/15/21 for lap cholecystectomy. He has hx including but not limited to anemia, CKD, renal CA, HTN, seizure, cholecystitis in 11/23 with cholecystostomy tube placed - now lap chole.  Clinical Impression  Pt admitted with above diagnosis. Pt has had a decline in mobility since November hospitalization with slowed gait speed using a RW for short community distances.  His wife also assist minimally with ADLs since November.  They do have necessary DME and she provides near 24 hr supervision/assist if needed.  He sleeps in lift chair and does use lift feature to stand.  Today, pt required mod A bed mobility but then min guard to stand and ambulate.  He did require several standing rest breaks for 50' of ambulation and with slow speed.  Will benefit from acute PT and recommend HHPT at d/c.  Pt currently with functional limitations due to the deficits listed below (see PT Problem List). Pt will benefit from skilled PT to increase their independence and safety with mobility to allow discharge to the venue listed below.          Recommendations for follow up therapy are one component of a multi-disciplinary discharge planning process, led by the attending physician.  Recommendations may be updated based on patient status, additional functional criteria and insurance authorization.  Follow Up Recommendations Home health PT      Assistance Recommended at Discharge Intermittent Supervision/Assistance  Patient can return home with the following  A little help with walking and/or transfers;A little help with bathing/dressing/bathroom;Assistance with cooking/housework;Help with stairs or ramp for entrance    Equipment Recommendations None recommended by PT  Recommendations for Other Services       Functional Status Assessment  Patient has had a recent decline in their functional status and demonstrates the ability to make significant improvements in function in a reasonable and predictable amount of time.     Precautions / Restrictions Precautions Precautions: Fall      Mobility  Bed Mobility Overal bed mobility: Needs Assistance Bed Mobility: Supine to Sit     Supine to sit: Mod assist     General bed mobility comments: mod A but sleeps in recliner at home    Transfers Overall transfer level: Needs assistance Equipment used: Rolling walker (2 wheels) Transfers: Sit to/from Stand Sit to Stand: Min guard           General transfer comment: From slightly elevated bed; increased time to rise    Ambulation/Gait Ambulation/Gait assistance: Min guard Gait Distance (Feet): 50 Feet Assistive device: Rolling walker (2 wheels) Gait Pattern/deviations: Step-to pattern, Decreased stride length, Trunk flexed Gait velocity: decreased     General Gait Details: Pt with very slow gait speed with 2-3 standing rest breaks and with trunk flexed.  Pt's wife reports slow gait speed and flexed at baseline, but is a bit worse today with decreased endurance.  Min cues for posutre and RW use  Financial trader Rankin (Stroke Patients Only)       Balance Overall balance assessment: Needs assistance Sitting-balance support: No upper extremity supported Sitting balance-Leahy Scale: Fair     Standing balance support: Bilateral upper extremity supported Standing balance-Leahy Scale: Poor Standing balance comment: requiring RW  Pertinent Vitals/Pain Pain Assessment Pain Assessment: Faces Faces Pain Scale: Hurts a little bit Pain Location: R abd Pain Descriptors / Indicators: Discomfort Pain Intervention(s): Limited activity within patient's tolerance, Monitored during session    Home Living Family/patient expects to be  discharged to:: Private residence Living Arrangements: Spouse/significant other Available Help at Discharge: Family Type of Home: House Home Access: Ramped entrance       Home Layout: One level Home Equipment: Advice worker (2 wheels);Shower seat - built in;Grab bars - toilet;Grab bars - tub/shower;BSC/3in1;Cane - single point Additional Comments: Lift recliner - sleeps in recliner, does use lift some to stand;    Prior Function Prior Level of Function : Needs assist             Mobility Comments: R leg pain and weaker than L at baseline; Normally uses RW; can do short community gait; reports slow cautious speed; no falls; uses lift chair to stand but can stand from chair if has good armrest; sleeps in recliner ADLs Comments: Since November spouse assist minimally with showers and dressing; can do toileting on his own; spouse does IADLs     Hand Dominance   Dominant Hand: Right    Extremity/Trunk Assessment   Upper Extremity Assessment Upper Extremity Assessment: Generalized weakness    Lower Extremity Assessment Lower Extremity Assessment: Generalized weakness    Cervical / Trunk Assessment Cervical / Trunk Assessment: Kyphotic  Communication   Communication: No difficulties  Cognition Arousal/Alertness: Awake/alert Behavior During Therapy: Flat affect Overall Cognitive Status: Within Functional Limits for tasks assessed Area of Impairment: Problem solving                             Problem Solving: Slow processing General Comments: Slow to respond at times and HOH that limits but otherwise functional        General Comments      Exercises     Assessment/Plan    PT Assessment Patient needs continued PT services  PT Problem List Decreased strength;Pain;Decreased range of motion;Decreased activity tolerance;Decreased knowledge of use of DME;Decreased balance;Decreased safety awareness;Decreased mobility;Decreased knowledge of  precautions       PT Treatment Interventions DME instruction;Therapeutic exercise;Gait training;Balance training;Neuromuscular re-education;Functional mobility training;Therapeutic activities;Patient/family education    PT Goals (Current goals can be found in the Care Plan section)  Acute Rehab PT Goals Patient Stated Goal: hopeful for d/c today with HHPT ; improve gait speed PT Goal Formulation: With patient/family Time For Goal Achievement: 03/30/22 Potential to Achieve Goals: Good    Frequency Min 3X/week     Co-evaluation               AM-PAC PT "6 Clicks" Mobility  Outcome Measure Help needed turning from your back to your side while in a flat bed without using bedrails?: A Little Help needed moving from lying on your back to sitting on the side of a flat bed without using bedrails?: A Lot Help needed moving to and from a bed to a chair (including a wheelchair)?: A Little Help needed standing up from a chair using your arms (e.g., wheelchair or bedside chair)?: A Little Help needed to walk in hospital room?: A Little Help needed climbing 3-5 steps with a railing? : A Lot 6 Click Score: 16    End of Session Equipment Utilized During Treatment: Gait belt Activity Tolerance: Patient tolerated treatment well Patient left: with chair alarm set;in chair;with call bell/phone within reach;with  family/visitor present Nurse Communication: Mobility status PT Visit Diagnosis: Other abnormalities of gait and mobility (R26.89);Muscle weakness (generalized) (M62.81)    Time: 1740-8144 PT Time Calculation (min) (ACUTE ONLY): 29 min   Charges:   PT Evaluation $PT Eval Low Complexity: 1 Low PT Treatments $Gait Training: 8-22 mins        Abran Richard, PT Acute Rehab [facility] 03/16/2022, 11:37 AM

## 2022-03-16 NOTE — Discharge Instructions (Signed)
[facility] LAPAROSCOPIC SURGERY: POST OP INSTRUCTIONS Always review your discharge instruction sheet given to you by the facility where your surgery was performed. IF YOU HAVE DISABILITY OR FAMILY LEAVE FORMS, YOU MUST BRING THEM TO THE OFFICE FOR PROCESSING.   DO NOT GIVE THEM TO YOUR DOCTOR.  PAIN CONTROL  First take acetaminophen (Tylenol) AND/or ibuprofen (Advil) to control your pain after surgery.  Follow directions on package.  Taking acetaminophen (Tylenol) and/or ibuprofen (Advil) regularly after surgery will help to control your pain and lower the amount of prescription pain medication you may need.  You should not take more than 3,000 mg (3 grams) of acetaminophen (Tylenol) in 24 hours.  You should not take ibuprofen (Advil), aleve, motrin, naprosyn or other NSAIDS if you have a history of stomach ulcers or chronic kidney disease.  A prescription for pain medication may be given to you upon discharge.  Take your pain medication as prescribed, if you still have uncontrolled pain after taking acetaminophen (Tylenol) or ibuprofen (Advil). Use ice packs to help control pain. If you need a refill on your pain medication, please contact your pharmacy.  They will contact our office to request authorization. Prescriptions will not be filled after 5pm or on week-ends.  HOME MEDICATIONS Take your usually prescribed medications unless otherwise directed.  DIET You should follow a light diet the first few days after arrival home.  Be sure to include lots of fluids daily. Avoid fatty, fried foods.   CONSTIPATION It is common to experience some constipation after surgery and if you are taking pain medication.  Increasing fluid intake and taking a stool softener (such as Colace) will usually help or prevent this problem from occurring.  A mild laxative (Milk of Magnesia or Miralax) should be taken according to package instructions if there are no bowel movements after 48  hours.  WOUND/INCISION CARE Most patients will experience some swelling and bruising in the area of the incisions.  Ice packs will help.  Swelling and bruising can take several days to resolve.  Unless discharge instructions indicate otherwise, follow guidelines below  STERI-STRIPS - you may remove your outer bandages 48 hours after surgery, and you may shower at that time.  You have steri-strips (small skin tapes) in place directly over the incision.  These strips should be left on the skin for 7-10 days.   DERMABOND/SKIN GLUE - you may shower in 24 hours.  The glue will flake off over the next 2-3 weeks. Any sutures or staples will be removed at the office during your follow-up visit.  ACTIVITIES You may resume regular (light) daily activities beginning the next day--such as daily self-care, walking, climbing stairs--gradually increasing activities as tolerated.  You may have sexual intercourse when it is comfortable.  Refrain from any heavy lifting or straining until approved by your doctor. You may drive when you are no longer taking prescription pain medication, you can comfortably wear a seatbelt, and you can safely maneuver your car and apply brakes.  FOLLOW-UP You should see your doctor in the office for a follow-up appointment approximately 2-3 weeks after your surgery.  You should have been given your post-op/follow-up appointment when your surgery was scheduled.  If you did not receive a post-op/follow-up appointment, make sure that you call for this appointment within a day or two after you arrive home to insure a convenient appointment time.   WHEN TO CALL YOUR DOCTOR: Fever over 101.0 Inability to urinate Continued bleeding from incision.   Increased pain, redness, or drainage from the incision. Increasing abdominal pain  The clinic staff is available to answer your questions during regular business hours.  Please don't hesitate to call and ask to speak to one of the nurses for  clinical concerns.  If you have a medical emergency, go to the nearest emergency room or call 911.  A surgeon from [facility] is always on call at the hospital. [address], [address], [place], [place]  [address] ? [address] [address], [place], [place]   [address] [phone] ? [phone] ? FAX [phone] Web site: [email or web address]  

## 2022-03-16 NOTE — Progress Notes (Signed)
Per order discharged patient.  Discussed s/s of infection, medications and follow up appts for patient.  Instructed on wound care.  Wife verbalized understanding, denies additional needs or information.  NADN.  Volunteer services called for wheelchair via unit clerk. Sreece, RN

## 2022-03-16 NOTE — TOC Initial Note (Signed)
Transition of Care (TOC) - Initial/Assessment Note   Spoke to patient and wife at bedside. They have had [facility] in past and would like [facility] again.   Tommi Rumps with [facility] accepted referral for HHPT  Patient Details  Name: Phillip [name] MRN: [id number] Date of Birth: 29-Feb-1945  Transition of Care [facility]) CM/SW Contact:    Marilu Favre, RN Phone Number: 03/16/2022, 12:50 PM  Clinical Narrative:                   Expected Discharge Plan: [place] Barriers to Discharge: No Barriers Identified   Patient Goals and CMS Choice Patient states their goals for this hospitalization and ongoing recovery are:: to return to home CMS Medicare.gov Compare Post Acute Care list provided to:: Patient Choice offered to / list presented to : Patient, [place] ownership interest in [facility].provided to:: Patient    Expected Discharge Plan and Services   Discharge Planning Services: CM Consult Post Acute Care Choice: Home Health                   DME Arranged: N/A         HH Arranged: PT HH Agency: [place] Date [facility] Agency Contacted: 03/16/22 Time HH Agency Contacted: 93 Representative spoke with at [place]: [place] Arrangements/Services   Lives with:: Spouse Patient language and need for interpreter reviewed:: Yes Do you feel safe going back to the place where you live?: Yes      Need for Family Participation in Patient Care: Yes (Comment) Care giver support system in place?: Yes (comment) Current home services: DME Criminal Activity/Legal Involvement Pertinent to Current Situation/Hospitalization: No - Comment as needed  Activities of Daily Living Home Assistive Devices/Equipment: Environmental consultant (specify type), Eyeglasses, Hearing aid, Blood pressure cuff, Shower chair with back, Wheelchair, [employer] (specify quad or straight), Bedside commode/3-in-1, Raised toilet seat with rails, Hand-held shower hose, Grab bars around  toilet, Grab bars in shower, Electric scooter ADL Screening (condition at time of admission) Patient's cognitive ability adequate to safely complete daily activities?: No Is the patient deaf or have difficulty hearing?: Yes (bilateral hearing aids) Does the patient have difficulty seeing, even when wearing glasses/contacts?: No Does the patient have difficulty concentrating, remembering, or making decisions?: Yes Patient able to express need for assistance with ADLs?: No Does the patient have difficulty dressing or bathing?: Yes Independently performs ADLs?: No Communication: Independent Dressing (OT): Needs assistance Is this a change from baseline?: Pre-admission baseline Grooming: Independent Feeding: Independent Bathing: Needs assistance Is this a change from baseline?: Pre-admission baseline Toileting: Independent with device (comment) In/Out Bed: Independent Walks in Home: Independent with device (comment) Does the patient have difficulty walking or climbing stairs?: Yes Weakness of Legs: Both Weakness of Arms/Hands: None  Permission Sought/Granted   Permission granted to share information with : Yes, Verbal Permission Granted  Share Information with NAME: wife  Permission granted to share info w AGENCY: [facility]        Emotional Assessment Appearance:: Appears stated age     Orientation: : Oriented to Self, Oriented to Place, Oriented to  Time, Oriented to Situation Alcohol / Substance Use: Not Applicable Psych Involvement: No (comment)  Admission diagnosis:  S/P laparoscopic cholecystectomy [Z90.49] Patient Active Problem List   Diagnosis Date Noted   S/P laparoscopic cholecystectomy 03/15/2022   Acute cholecystitis 01/08/2022   Seizure disorder ([place]) 01/08/2022   Sepsis ([place]) 01/08/2022   AKI (  acute kidney injury) ([place]) 54/65/6812   Acute metabolic encephalopathy 75/17/0017   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: No Food Insecurity (01/08/2022)  Housing: Low Risk  (01/08/2022)  Transportation Needs: No Transportation Needs (01/08/2022)  Utilities: Not At Risk (01/08/2022)  Tobacco Use: Low Risk  (03/16/2022)   SDOH Interventions:     Readmission Risk Interventions    01/09/2022    1:14 PM  Readmission Risk Prevention Plan  Transportation Screening Complete  PCP or Specialist Appt within 3-5 Days Complete  Social Work Consult for [place] Planning/Counseling Complete  Palliative Care Screening Complete  Medication Review Press photographer) Complete

## 2022-03-16 NOTE — Progress Notes (Signed)
[facility] Progress Note  1 Day Post-Op  Subjective: CC:  Doing ok - states he had a lot of pain last night after surgery. Tolerating PO. Mobilized to the bathroom using the walker and felt like it was more difficult than usual. No reported urinary sxs.   Objective: Vital signs in last 24 hours: Temp:  [97.6 F (36.4 C)-99.1 F (37.3 C)] 99.1 F (37.3 C) (01/19 0354) Pulse Rate:  [66-105] 105 (01/19 0354) Resp:  [13-19] 17 (01/19 0354) BP: (113-154)/(60-76) 125/62 (01/19 0354) SpO2:  [93 %-99 %] 94 % (01/19 0354)    Intake/Output from previous day: 01/18 0701 - 01/19 0700 In: 1695.6 [I.V.:1345.6; IV Piggyback:350] Out: 850 [Urine:800; Blood:50] Intake/Output this shift: No intake/output data recorded.  PE: Gen:  Alert, NAD, appears chronically ill Card:  Regular rate and rhythm Pulm:  Normal effort Abd: Soft, appropriately tender, mild distention, incisions c/d/i Skin: warm and dry, no rashes  Psych: A&Ox3   Lab Results:  Recent Labs    03/16/22 0347  WBC 14.8*  HGB 10.3*  HCT 31.8*  PLT 410*   BMET Recent Labs    03/16/22 0347  NA 134*  K 4.2  CL 96*  CO2 28  GLUCOSE 161*  BUN 14  CREATININE 0.78  CALCIUM 10.1   PT/INR No results for input(s): "LABPROT", "INR" in the last 72 hours. CMP     Component Value Date/Time   NA 134 (L) 03/16/2022 0347   NA 140 02/21/2017 1128   K 4.2 03/16/2022 0347   K 4.3 02/21/2017 1128   CL 96 (L) 03/16/2022 0347   CO2 28 03/16/2022 0347   CO2 26 02/21/2017 1128   GLUCOSE 161 (H) 03/16/2022 0347   GLUCOSE 115 02/21/2017 1128   BUN 14 03/16/2022 0347   BUN 16.6 02/21/2017 1128   CREATININE 0.78 03/16/2022 0347   CREATININE 0.95 02/15/2022 1112   CREATININE 0.8 02/21/2017 1128   CALCIUM 10.1 03/16/2022 0347   CALCIUM 9.4 02/21/2017 1128   PROT 6.2 (L) 03/16/2022 0347   PROT 7.5 02/21/2017 1128   ALBUMIN 2.5 (L) 03/16/2022 0347   ALBUMIN 4.2 02/21/2017 1128   AST 29 03/16/2022 0347   AST 15  02/15/2022 1112   AST 20 02/21/2017 1128   ALT 26 03/16/2022 0347   ALT 19 02/15/2022 1112   ALT 43 02/21/2017 1128   ALKPHOS 122 03/16/2022 0347   ALKPHOS 104 02/21/2017 1128   BILITOT 0.7 03/16/2022 0347   BILITOT 0.4 02/15/2022 1112   BILITOT 0.36 02/21/2017 1128   GFRNONAA >60 03/16/2022 0347   GFRNONAA >60 02/15/2022 1112   GFRAA >60 11/05/2019 0957   Lipase     Component Value Date/Time   LIPASE 22 01/08/2022 1210       Studies/Results: No results found.  Anti-infectives: Anti-infectives (From admission, onward)    Start     Dose/Rate Route Frequency Ordered Stop   03/15/22 0603  ceFAZolin (ANCEF) 2-4 GM/100ML-% IVPB       Note to Pharmacy: Barbie Haggis N: cabinet override      03/15/22 0603 03/15/22 0740   03/15/22 0600  ceFAZolin (ANCEF) IVPB 2g/100 mL premix        2 g 200 mL/hr over 30 Minutes Intravenous On call to O.R. 03/15/22 0558 03/15/22 0745        Assessment/Plan  Chronic cholecystitis, cholelithiasis S/p laparoscopic cholecystectomy 1/18 Dr. Ninfa Linden -[facility] - re-ordered home pain medication, oxycontin '20mg'$  BID - PT eval  - possible afternoon  discharge pending how he progresses today. Pain not controlled, feels unsteady with ambulation. I have ordered home health PT.    LOS: 0 days   I reviewed nursing notes, last 24 h vitals and pain scores, last 48 h intake and output, last 24 h labs and trends, and last 24 h imaging results.    Obie Dredge, PA-C [facility] Please see Amion for pager number during day hours 7:00am-4:30pm

## 2022-03-19 ENCOUNTER — Ambulatory Visit: Payer: PPO | Admitting: Internal Medicine

## 2022-03-19 ENCOUNTER — Other Ambulatory Visit (HOSPITAL_COMMUNITY): Payer: Self-pay

## 2022-03-19 LAB — SURGICAL PATHOLOGY

## 2022-03-19 NOTE — Discharge Summary (Signed)
[facility] Discharge Summary   Patient ID: Jhordan Mckibben MRN: [id number] DOB/AGE: [birth year] 78 y.o.  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  Procedures Dr. Coralie Keens (03/15/22) - Laparoscopic Cholecystectomy   Hospital Course:  77y/o M with metastatic renal cell carcinoma and percutaneous cholecystostomy tube who presented to [place] with chronic cholecystitis. He was admitted with plans for cholecystectomy. Underwent the procedure above and tolerated the procedure well. Was admitted to the floor.  Diet was advanced as tolerated. He worked with PT.  On POD#1, the patient was voiding well, tolerating diet, ambulating well with assistance, pain well controlled, vital signs stable, incisions c/d/i and felt stable for discharge home.  Patient will follow up in our office with Dr. Ninfa Linden as below.  Physical Exam: General:  Alert, NAD, pleasant, comfortable Abd:  Soft, ND, mild tenderness, incisions C/D/I  Allergies as of 03/16/2022       Reactions   Contrast Media [iodinated Contrast Media] Rash        Medication List     STOP taking these medications     amoxicillin-clavulanate 875-125 MG tablet Commonly known as: AUGMENTIN       TAKE these medications    Comirnaty syringe Generic drug: COVID-19 mRNA vaccine 2023-2024 Inject into the muscle.   docusate sodium 100 MG capsule Commonly known as: COLACE Take 200 mg by mouth 2 (two) times daily.   dorzolamide 2 % ophthalmic solution Commonly known as: TRUSOPT Instill 1 drop into right eye three times daily.   hydrocortisone 10 MG tablet Commonly known as: CORTEF Take 2 tablets (20 mg total) by mouth every morning, THEN 1 tablet (10 mg total) every evening Start taking on: February 28, 2022 What changed: Another medication with the same name was removed. Continue taking this medication, and follow the directions you see here.   Lenvima (18 MG Daily Dose) 10 MG & 2 x 4 MG capsule Generic drug: lenvatinib 18 mg daily dose Take 18 mg by mouth daily.   levETIRAcetam 500 MG tablet Commonly known as: KEPPRA Take 1 tablet (500 mg total) by mouth 2 (two) times daily. What changed: how much to take   lidocaine-prilocaine cream Commonly known as: EMLA APPLY TO PORTACATH 1 HOUR PRIOR TO USE AS NEEDED   losartan-hydrochlorothiazide 100-12.5 MG tablet Commonly known as: HYZAAR Take 1 tablet by mouth once a day.   mirtazapine 30 MG tablet Commonly known as: REMERON Take 1 tablet (30 mg total) by mouth at bedtime.   ondansetron 8 MG tablet Commonly known as: ZOFRAN Take 1 tablet (8 mg total) by mouth every 8 (eight) hours as needed for nausea for vomiting.   oxyCODONE 5 MG immediate release tablet Commonly known as: Oxy IR/ROXICODONE Take 1 tablet (5 mg  total) by mouth every 6 (six) hours as needed for severe pain. What changed: Another medication with the same name was changed. Make sure you understand how and when to take each.   oxyCODONE 10 mg 12 hr tablet Commonly known as: OXYCONTIN Take 2 tablets (20 mg total) by mouth every 12 (twelve) hours. What changed:  how much  to take when to take this additional instructions   polyethylene glycol 17 g packet Commonly known as: MIRALAX / GLYCOLAX Take 17 g by mouth daily as needed for moderate constipation.   prochlorperazine 10 MG tablet Commonly known as: COMPAZINE Take 1 tablet by mouth every 6 hours as needed for nausea or vomiting.   saccharomyces boulardii 250 MG capsule Commonly known as: FLORASTOR Take 250 mg by mouth 2 (two) times daily.   Testosterone 20.25 MG/ACT (1.62%) Gel Apply 2 pumps topically daily   Vitamin B12 1000 MCG Tbcr Take 1,000 mcg by mouth daily.   Vitamin D 50 MCG (2000 UT) Caps Take 2,000 Units by mouth daily.          Follow-up Information     [facility] Follow up.   Specialty: Home Health Services Contact information: [place] [address] [phone]         Coralie Keens, MD Follow up.   Specialty: General Surgery Why: our office is scheduling you for post-operative follow up. please call to confirm appointment date/time. Contact information: [address] [place] [address] [phone]                 Signed: Obie Dredge, [facility] Surgery 03/19/2022, 2:27 PM

## 2022-03-20 DIAGNOSIS — Z7952 Long term (current) use of systemic steroids: Secondary | ICD-10-CM | POA: Diagnosis not present

## 2022-03-20 DIAGNOSIS — N189 Chronic kidney disease, unspecified: Secondary | ICD-10-CM | POA: Diagnosis not present

## 2022-03-20 DIAGNOSIS — C649 Malignant neoplasm of unspecified kidney, except renal pelvis: Secondary | ICD-10-CM | POA: Diagnosis not present

## 2022-03-20 DIAGNOSIS — Z9181 History of falling: Secondary | ICD-10-CM | POA: Diagnosis not present

## 2022-03-20 DIAGNOSIS — Z9049 Acquired absence of other specified parts of digestive tract: Secondary | ICD-10-CM | POA: Diagnosis not present

## 2022-03-20 DIAGNOSIS — D631 Anemia in chronic kidney disease: Secondary | ICD-10-CM | POA: Diagnosis not present

## 2022-03-20 DIAGNOSIS — D630 Anemia in neoplastic disease: Secondary | ICD-10-CM | POA: Diagnosis not present

## 2022-03-20 DIAGNOSIS — Z87891 Personal history of nicotine dependence: Secondary | ICD-10-CM | POA: Diagnosis not present

## 2022-03-20 DIAGNOSIS — I129 Hypertensive chronic kidney disease with stage 1 through stage 4 chronic kidney disease, or unspecified chronic kidney disease: Secondary | ICD-10-CM | POA: Diagnosis not present

## 2022-03-20 DIAGNOSIS — Z48815 Encounter for surgical aftercare following surgery on the digestive system: Secondary | ICD-10-CM | POA: Diagnosis not present

## 2022-03-20 DIAGNOSIS — Z79891 Long term (current) use of opiate analgesic: Secondary | ICD-10-CM | POA: Diagnosis not present

## 2022-03-21 ENCOUNTER — Ambulatory Visit: Payer: PPO | Admitting: Internal Medicine

## 2022-03-22 ENCOUNTER — Telehealth: Payer: Self-pay | Admitting: *Deleted

## 2022-03-22 ENCOUNTER — Other Ambulatory Visit: Payer: Self-pay | Admitting: Radiation Therapy

## 2022-03-22 DIAGNOSIS — C7931 Secondary malignant neoplasm of brain: Secondary | ICD-10-CM

## 2022-03-22 NOTE — Progress Notes (Signed)
Orders placed for port access and de-access the day of brain MRI at [facility].

## 2022-03-22 NOTE — Telephone Encounter (Signed)
Patients wife called to update that patient had gall bladder removal over a week ago and since has been home doing home PT.  Patient prior to surgery was atleast walking.  She states that he is extremely weak and unable to ambulate and she worries he will not be able to make his MRI. She is even considering placement for Skilled Rehab.   Discussed process for that and also advised of ER wait times currently.  She is trying to see if he needs a boost with steroids to help get him back to pre hospital state to avoid having to go to the ER to get a bed for placement to snf.  He is currently taking Cortef 20 mg/10 mg.    Routing to Dr Mickeal Skinner to advise if he has any suggestions.

## 2022-03-22 NOTE — Telephone Encounter (Signed)
Communicated Dr Renda Rolls response to wife [name].  She states understanding and will report back if improvement is seen or not.

## 2022-03-25 ENCOUNTER — Other Ambulatory Visit: Payer: Self-pay | Admitting: Oncology

## 2022-03-27 ENCOUNTER — Other Ambulatory Visit: Payer: Self-pay

## 2022-03-27 ENCOUNTER — Ambulatory Visit
Admission: RE | Admit: 2022-03-27 | Discharge: 2022-03-27 | Disposition: A | Discharge status: Home / Self Care | Payer: PPO | Source: Ambulatory Visit | Attending: Internal Medicine | Admitting: Internal Medicine

## 2022-03-27 DIAGNOSIS — C729 Malignant neoplasm of central nervous system, unspecified: Secondary | ICD-10-CM | POA: Diagnosis not present

## 2022-03-27 DIAGNOSIS — C7931 Secondary malignant neoplasm of brain: Secondary | ICD-10-CM

## 2022-03-27 MED ORDER — HEPARIN SOD (PORK) LOCK FLUSH 100 UNIT/ML IV SOLN
500.0000 [IU] | Freq: Once | INTRAVENOUS | Status: AC
  Administered 2022-03-27: 500 [IU] via INTRAVENOUS

## 2022-03-27 MED ORDER — SODIUM CHLORIDE 0.9% FLUSH
10.0000 mL | INTRAVENOUS | Status: DC | PRN
  Administered 2022-03-27: 10 mL via INTRAVENOUS

## 2022-03-27 MED ORDER — GADOPICLENOL 0.5 MMOL/ML IV SOLN
9.0000 mL | Freq: Once | INTRAVENOUS | Status: AC | PRN
  Administered 2022-03-27: 9 mL via INTRAVENOUS

## 2022-03-27 MED ORDER — GADOPICLENOL 0.5 MMOL/ML IV SOLN: 10.0000 mL | Freq: Once | INTRAVENOUS | Status: DC | PRN

## 2022-03-28 ENCOUNTER — Other Ambulatory Visit (HOSPITAL_COMMUNITY): Payer: Self-pay

## 2022-03-28 ENCOUNTER — Other Ambulatory Visit: Payer: Self-pay | Admitting: Nurse Practitioner

## 2022-03-28 ENCOUNTER — Other Ambulatory Visit: Payer: Self-pay

## 2022-03-28 ENCOUNTER — Ambulatory Visit: Payer: PPO | Admitting: Internal Medicine

## 2022-03-28 DIAGNOSIS — C649 Malignant neoplasm of unspecified kidney, except renal pelvis: Secondary | ICD-10-CM

## 2022-03-28 MED ORDER — TESTOSTERONE 1.62 % TD GEL
2.0000 | Freq: Every day | TRANSDERMAL | 3 refills | Status: DC
  Filled 2022-03-28: qty 75, 30d supply, fill #0

## 2022-03-28 MED ORDER — OXYCODONE HCL 5 MG PO TABS
5.0000 mg | ORAL_TABLET | Freq: Four times a day (QID) | ORAL | 0 refills | Status: DC | PRN
  Filled 2022-03-28: qty 60, 15d supply, fill #0

## 2022-03-29 ENCOUNTER — Other Ambulatory Visit: Payer: Self-pay

## 2022-03-29 ENCOUNTER — Inpatient Hospital Stay: Payer: PPO | Admitting: Oncology

## 2022-03-29 ENCOUNTER — Other Ambulatory Visit: Payer: Self-pay | Admitting: *Deleted

## 2022-03-29 ENCOUNTER — Other Ambulatory Visit (HOSPITAL_COMMUNITY): Payer: Self-pay

## 2022-03-29 ENCOUNTER — Inpatient Hospital Stay: Payer: PPO

## 2022-03-29 ENCOUNTER — Other Ambulatory Visit (HOSPITAL_BASED_OUTPATIENT_CLINIC_OR_DEPARTMENT_OTHER): Payer: Self-pay

## 2022-03-29 DIAGNOSIS — C787 Secondary malignant neoplasm of liver and intrahepatic bile duct: Secondary | ICD-10-CM | POA: Diagnosis present

## 2022-03-29 DIAGNOSIS — C649 Malignant neoplasm of unspecified kidney, except renal pelvis: Secondary | ICD-10-CM | POA: Diagnosis not present

## 2022-03-29 DIAGNOSIS — C642 Malignant neoplasm of left kidney, except renal pelvis: Secondary | ICD-10-CM

## 2022-03-29 DIAGNOSIS — R5383 Other fatigue: Secondary | ICD-10-CM

## 2022-03-29 DIAGNOSIS — R609 Edema, unspecified: Secondary | ICD-10-CM | POA: Diagnosis not present

## 2022-03-29 DIAGNOSIS — D509 Iron deficiency anemia, unspecified: Secondary | ICD-10-CM | POA: Diagnosis not present

## 2022-03-29 DIAGNOSIS — A419 Sepsis, unspecified organism: Secondary | ICD-10-CM | POA: Diagnosis not present

## 2022-03-29 DIAGNOSIS — E669 Obesity, unspecified: Secondary | ICD-10-CM | POA: Diagnosis present

## 2022-03-29 DIAGNOSIS — C7931 Secondary malignant neoplasm of brain: Secondary | ICD-10-CM | POA: Diagnosis present

## 2022-03-29 DIAGNOSIS — E538 Deficiency of other specified B group vitamins: Secondary | ICD-10-CM | POA: Insufficient documentation

## 2022-03-29 DIAGNOSIS — G9341 Metabolic encephalopathy: Secondary | ICD-10-CM | POA: Diagnosis present

## 2022-03-29 DIAGNOSIS — C412 Malignant neoplasm of vertebral column: Secondary | ICD-10-CM | POA: Diagnosis not present

## 2022-03-29 DIAGNOSIS — R6510 Systemic inflammatory response syndrome (SIRS) of non-infectious origin without acute organ dysfunction: Secondary | ICD-10-CM | POA: Diagnosis not present

## 2022-03-29 DIAGNOSIS — I10 Essential (primary) hypertension: Secondary | ICD-10-CM | POA: Diagnosis present

## 2022-03-29 DIAGNOSIS — R195 Other fecal abnormalities: Secondary | ICD-10-CM | POA: Diagnosis not present

## 2022-03-29 DIAGNOSIS — E278 Other specified disorders of adrenal gland: Secondary | ICD-10-CM | POA: Diagnosis not present

## 2022-03-29 DIAGNOSIS — R0689 Other abnormalities of breathing: Secondary | ICD-10-CM | POA: Diagnosis not present

## 2022-03-29 DIAGNOSIS — R531 Weakness: Secondary | ICD-10-CM | POA: Diagnosis present

## 2022-03-29 DIAGNOSIS — Z79899 Other long term (current) drug therapy: Secondary | ICD-10-CM | POA: Insufficient documentation

## 2022-03-29 DIAGNOSIS — Z7189 Other specified counseling: Secondary | ICD-10-CM | POA: Diagnosis not present

## 2022-03-29 DIAGNOSIS — C7951 Secondary malignant neoplasm of bone: Secondary | ICD-10-CM | POA: Diagnosis present

## 2022-03-29 DIAGNOSIS — N179 Acute kidney failure, unspecified: Secondary | ICD-10-CM | POA: Diagnosis present

## 2022-03-29 DIAGNOSIS — Z9049 Acquired absence of other specified parts of digestive tract: Secondary | ICD-10-CM | POA: Diagnosis not present

## 2022-03-29 DIAGNOSIS — Z7401 Bed confinement status: Secondary | ICD-10-CM | POA: Diagnosis not present

## 2022-03-29 DIAGNOSIS — R6520 Severe sepsis without septic shock: Secondary | ICD-10-CM | POA: Diagnosis not present

## 2022-03-29 DIAGNOSIS — F32A Depression, unspecified: Secondary | ICD-10-CM | POA: Diagnosis present

## 2022-03-29 DIAGNOSIS — L89152 Pressure ulcer of sacral region, stage 2: Secondary | ICD-10-CM | POA: Diagnosis not present

## 2022-03-29 DIAGNOSIS — G40909 Epilepsy, unspecified, not intractable, without status epilepticus: Secondary | ICD-10-CM | POA: Diagnosis present

## 2022-03-29 DIAGNOSIS — I700 Atherosclerosis of aorta: Secondary | ICD-10-CM | POA: Diagnosis not present

## 2022-03-29 DIAGNOSIS — Z66 Do not resuscitate: Secondary | ICD-10-CM | POA: Diagnosis present

## 2022-03-29 DIAGNOSIS — C7970 Secondary malignant neoplasm of unspecified adrenal gland: Secondary | ICD-10-CM | POA: Diagnosis present

## 2022-03-29 DIAGNOSIS — R0602 Shortness of breath: Secondary | ICD-10-CM | POA: Diagnosis not present

## 2022-03-29 DIAGNOSIS — I82452 Acute embolism and thrombosis of left peroneal vein: Secondary | ICD-10-CM | POA: Diagnosis present

## 2022-03-29 DIAGNOSIS — T8383XA Hemorrhage of genitourinary prosthetic devices, implants and grafts, initial encounter: Secondary | ICD-10-CM | POA: Diagnosis not present

## 2022-03-29 DIAGNOSIS — I82442 Acute embolism and thrombosis of left tibial vein: Secondary | ICD-10-CM | POA: Diagnosis present

## 2022-03-29 DIAGNOSIS — R000 Tachycardia, unspecified: Secondary | ICD-10-CM | POA: Diagnosis not present

## 2022-03-29 DIAGNOSIS — R062 Wheezing: Secondary | ICD-10-CM | POA: Diagnosis not present

## 2022-03-29 DIAGNOSIS — I82432 Acute embolism and thrombosis of left popliteal vein: Secondary | ICD-10-CM | POA: Diagnosis present

## 2022-03-29 DIAGNOSIS — C641 Malignant neoplasm of right kidney, except renal pelvis: Secondary | ICD-10-CM | POA: Diagnosis not present

## 2022-03-29 DIAGNOSIS — R231 Pallor: Secondary | ICD-10-CM | POA: Diagnosis not present

## 2022-03-29 DIAGNOSIS — Z515 Encounter for palliative care: Secondary | ICD-10-CM | POA: Diagnosis not present

## 2022-03-29 DIAGNOSIS — Y846 Urinary catheterization as the cause of abnormal reaction of the patient, or of later complication, without mention of misadventure at the time of the procedure: Secondary | ICD-10-CM | POA: Diagnosis not present

## 2022-03-29 DIAGNOSIS — I82412 Acute embolism and thrombosis of left femoral vein: Secondary | ICD-10-CM | POA: Diagnosis present

## 2022-03-29 DIAGNOSIS — M25561 Pain in right knee: Secondary | ICD-10-CM | POA: Diagnosis not present

## 2022-03-29 DIAGNOSIS — C7952 Secondary malignant neoplasm of bone marrow: Secondary | ICD-10-CM | POA: Diagnosis present

## 2022-03-29 DIAGNOSIS — Z95828 Presence of other vascular implants and grafts: Secondary | ICD-10-CM

## 2022-03-29 DIAGNOSIS — C7801 Secondary malignant neoplasm of right lung: Secondary | ICD-10-CM | POA: Diagnosis present

## 2022-03-29 DIAGNOSIS — D72829 Elevated white blood cell count, unspecified: Secondary | ICD-10-CM | POA: Diagnosis not present

## 2022-03-29 DIAGNOSIS — E871 Hypo-osmolality and hyponatremia: Secondary | ICD-10-CM | POA: Diagnosis present

## 2022-03-29 DIAGNOSIS — I82462 Acute embolism and thrombosis of left calf muscular vein: Secondary | ICD-10-CM | POA: Diagnosis present

## 2022-03-29 DIAGNOSIS — R0902 Hypoxemia: Secondary | ICD-10-CM | POA: Diagnosis not present

## 2022-03-29 DIAGNOSIS — I82402 Acute embolism and thrombosis of unspecified deep veins of left lower extremity: Secondary | ICD-10-CM | POA: Diagnosis not present

## 2022-03-29 DIAGNOSIS — T8143XA Infection following a procedure, organ and space surgical site, initial encounter: Secondary | ICD-10-CM | POA: Diagnosis not present

## 2022-03-29 DIAGNOSIS — D631 Anemia in chronic kidney disease: Secondary | ICD-10-CM | POA: Diagnosis present

## 2022-03-29 DIAGNOSIS — Z1152 Encounter for screening for COVID-19: Secondary | ICD-10-CM | POA: Diagnosis not present

## 2022-03-29 DIAGNOSIS — R109 Unspecified abdominal pain: Secondary | ICD-10-CM | POA: Diagnosis not present

## 2022-03-29 DIAGNOSIS — R509 Fever, unspecified: Secondary | ICD-10-CM | POA: Diagnosis not present

## 2022-03-29 LAB — CBC WITH DIFFERENTIAL (CANCER CENTER ONLY)
Abs Immature Granulocytes: 0.22 10*3/uL — ABNORMAL HIGH (ref 0.00–0.07)
Basophils Absolute: 0.1 10*3/uL (ref 0.0–0.1)
Basophils Relative: 1 %
Eosinophils Absolute: 0.3 10*3/uL (ref 0.0–0.5)
Eosinophils Relative: 2 %
HCT: 31.8 % — ABNORMAL LOW (ref 39.0–52.0)
Hemoglobin: 9.6 g/dL — ABNORMAL LOW (ref 13.0–17.0)
Immature Granulocytes: 1 %
Lymphocytes Relative: 11 %
Lymphs Abs: 1.9 10*3/uL (ref 0.7–4.0)
MCH: 25.3 pg — ABNORMAL LOW (ref 26.0–34.0)
MCHC: 30.2 g/dL (ref 30.0–36.0)
MCV: 83.9 fL (ref 80.0–100.0)
Monocytes Absolute: 1.3 10*3/uL — ABNORMAL HIGH (ref 0.1–1.0)
Monocytes Relative: 8 %
Neutro Abs: 12.7 10*3/uL — ABNORMAL HIGH (ref 1.7–7.7)
Neutrophils Relative %: 77 %
Platelet Count: 623 10*3/uL — ABNORMAL HIGH (ref 150–400)
RBC: 3.79 MIL/uL — ABNORMAL LOW (ref 4.22–5.81)
RDW: 18 % — ABNORMAL HIGH (ref 11.5–15.5)
WBC Count: 16.4 10*3/uL — ABNORMAL HIGH (ref 4.0–10.5)
nRBC: 0 % (ref 0.0–0.2)

## 2022-03-29 LAB — CMP (CANCER CENTER ONLY)
ALT: 107 U/L — ABNORMAL HIGH (ref 0–44)
AST: 38 U/L (ref 15–41)
Albumin: 3.3 g/dL — ABNORMAL LOW (ref 3.5–5.0)
Alkaline Phosphatase: 188 U/L — ABNORMAL HIGH (ref 38–126)
Anion gap: 8 (ref 5–15)
BUN: 16 mg/dL (ref 8–23)
CO2: 29 mmol/L (ref 22–32)
Calcium: 11.8 mg/dL — ABNORMAL HIGH (ref 8.9–10.3)
Chloride: 100 mmol/L (ref 98–111)
Creatinine: 0.56 mg/dL — ABNORMAL LOW (ref 0.61–1.24)
GFR, Estimated: >60 mL/min (ref >60)
Glucose, Bld: 170 mg/dL — ABNORMAL HIGH (ref 70–99)
Potassium: 4.4 mmol/L (ref 3.5–5.1)
Sodium: 137 mmol/L (ref 135–145)
Total Bilirubin: 0.6 mg/dL (ref 0.3–1.2)
Total Protein: 6.9 g/dL (ref 6.5–8.1)

## 2022-03-29 LAB — TSH: TSH: 0.96 u[IU]/mL (ref 0.350–4.500)

## 2022-03-29 MED ORDER — ZOLEDRONIC ACID 4 MG/100ML IV SOLN
4.0000 mg | Freq: Once | INTRAVENOUS | Status: AC
  Administered 2022-03-29: 4 mg via INTRAVENOUS
  Filled 2022-03-29: qty 100

## 2022-03-29 MED ORDER — DEXAMETHASONE 4 MG PO TABS
4.0000 mg | ORAL_TABLET | Freq: Two times a day (BID) | ORAL | 0 refills | Status: DC
  Filled 2022-03-29 (×3): qty 14, 7d supply, fill #0

## 2022-03-29 MED ORDER — SODIUM CHLORIDE 0.9 % IV SOLN: Freq: Once | INTRAVENOUS | Status: AC

## 2022-03-29 MED ORDER — SODIUM CHLORIDE 0.9% FLUSH
10.0000 mL | Freq: Once | INTRAVENOUS | Status: AC | PRN
  Administered 2022-03-29: 10 mL

## 2022-03-29 MED ORDER — HEPARIN SOD (PORK) LOCK FLUSH 100 UNIT/ML IV SOLN
500.0000 [IU] | Freq: Once | INTRAVENOUS | Status: AC | PRN
  Administered 2022-03-29: 500 [IU]

## 2022-03-29 MED ORDER — DEXAMETHASONE 4 MG PO TABS
4.0000 mg | ORAL_TABLET | Freq: Every day | ORAL | 0 refills | Status: DC
  Filled 2022-03-29 (×2): qty 14, 14d supply, fill #0

## 2022-03-29 NOTE — Progress Notes (Signed)
Call to [facility] and referral information provided to [place] in new patient intake. They will pull records and reach out to his wife, Phillip [name]. Dr. [name] will be attending.

## 2022-03-29 NOTE — Progress Notes (Signed)
Per Dr. [name], needs CMP at [facility] on 04/02/22. Scheduling message sent.

## 2022-03-29 NOTE — Progress Notes (Signed)
[facility] OFFICE PROGRESS NOTE   Diagnosis: Renal cell carcinoma  INTERVAL HISTORY:   Phillip [name] returns as scheduled.  He underwent a cholecystectomy 03/15/2022.  He is here today with his wife.  She reports he has been very weak prior to and following surgery.  He is able to ambulate approximately 20 feet in the home.  He is participating in home physical therapy.  He continues lenvatinib and last received pembrolizumab on 02/15/2022.  He underwent an MRI of the brain on 03/27/2022.   Objective:  Vital signs in last 24 hours:  Blood pressure 127/72, pulse 100, temperature 98.2 F (36.8 C), temperature source Oral, resp. rate 18, height '5\' 11"'$  (1.803 m), weight 225 lb (102.1 kg), SpO2 96 %.    HEENT: The mouth is dry, no thrush Resp: Breath sounds, clear bilaterally, no respiratory distress Cardio: Regular rate and rhythm GI: No hepatosplenomegaly Vascular: No leg edema Neuro: Lethargic, arousable, follows commands, little verbal communication    Portacath/PICC-without erythema  Lab Results:  Lab Results  Component Value Date   WBC 16.4 (H) 03/29/2022   HGB 9.6 (L) 03/29/2022   HCT 31.8 (L) 03/29/2022   MCV 83.9 03/29/2022   PLT 623 (H) 03/29/2022   NEUTROABS 12.7 (H) 03/29/2022    CMP  Lab Results  Component Value Date   NA 137 03/29/2022   K 4.4 03/29/2022   CL 100 03/29/2022   CO2 29 03/29/2022   GLUCOSE 170 (H) 03/29/2022   BUN 16 03/29/2022   CREATININE 0.56 (L) 03/29/2022   CALCIUM 11.8 (H) 03/29/2022   PROT 6.9 03/29/2022   ALBUMIN 3.3 (L) 03/29/2022   AST 38 03/29/2022   ALT 107 (H) 03/29/2022   ALKPHOS 188 (H) 03/29/2022   BILITOT 0.6 03/29/2022   GFRNONAA >60 03/29/2022   GFRAA >60 11/05/2019    Lab Results  Component Value Date   CEA1 1.77 01/04/2016    Lab Results  Component Value Date   INR 1.3 (H) 01/09/2022   LABPROT 15.9 (H) 01/09/2022    Imaging:  Phillip Brain W Wo Contrast  Result Date: 03/28/2022 CLINICAL  DATA:  Brain/CNS neoplasm, assess treatment response. History of renal cell carcinoma. EXAM: MRI HEAD WITHOUT AND WITH CONTRAST TECHNIQUE: Multiplanar, multiecho pulse sequences of the brain and surrounding structures were obtained without and with intravenous contrast. CONTRAST:  10 mL Vueway Contrast was administered via a port which was accessed by a Equities trader. COMPARISON:  Head CT 01/08/2022 and MRI 01/02/2022 FINDINGS: Brain: New lesions: None. Larger lesions: 1. Minimally increased size of a 3 mm enhancing lesion superiorly in the right cerebellar hemisphere (series 14, image 48, previously 2 mm). 2. Increased size of the heterogeneously enhancing mass in the parasagittal left frontal lobe measuring 3.0 x 2.2 cm (series 17, image 23 and series 14, image 130), including enlargement of the more solidly enhancing anterior component noted on the prior study as well as new adjacent gyriform enhancement extending posteriorly and laterally. Mild worsening of moderate edema in the left frontal white matter without significant mass effect. Stable or smaller lesions: 1. 9 mm lesion laterally in the right occipital lobe, unchanged (series 14, image 69). 2. 3 mm right parietal cortical lesion, unchanged (series 14, image 101). 3. 4 mm right parietal cortical lesion (new on the prior MRI), unchanged (series 14, image 111). 4. 3 mm cortical lesion in the right superior frontal gyrus, unchanged (series 14, image 141). 5. 10 mm lesion laterally in the left occipital  lobe, unchanged (series 14, image 52). 6. Irregular 8 mm left parietal lesion, stable to slightly smaller (series 14, image 105). 7. 5 mm left parietal lesion, unchanged (series 14, image 112). 8. 10 mm lesion posteriorly in the left superior frontal gyrus, unchanged (series 14, image 149). 9. Two small foci of enhancement measuring up to 3 mm in the inferior left temporal lobe, not significantly changed (series 16, image 30). Other brain findings: There is  unchanged mild edema or gliosis in the left temporal and lateral left occipital lobes. There are chronic blood products associated with multiple treated metastases. No acute infarct, midline shift, or extra-axial fluid collection is evident. There is a background of mild chronic small vessel ischemic disease in the cerebral white matter. 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 A chronic lacunar infarct at the posterior aspect of the right putamen/external capsule is also unchanged. There is mild cerebral atrophy. Vascular: Major intracranial vascular flow voids are preserved. Skull and upper cervical spine: No suspicious marrow lesion. Sinuses/Orbits: Unremarkable orbits. Trace mucosal thickening in the ethmoid sinuses. Clear mastoid air cells. Other: None. IMPRESSION: 1. Increased size of a 3 cm left frontal lobe mass. While new gyriform enhancement in this region may be treatment related, the more solidly enhancing anterior portion of the mass has also enlarged which could reflect a component of tumor progression. 2. Minimally increased size of a 3 mm right cerebellar lesion. 3. Unchanged or slightly decreased size of other metastases. 4. No evidence of new intracranial metastases. Electronically Signed   By: Logan Bores M.D.   On: 03/28/2022 11:11    Medications: I have reviewed the patient's current medications.   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, L4 nerve compression Biopsy of the L4 mass 11/18/2015 confirmed metastatic renal cell carcinoma, clear cell type CTs of the chest, abdomen, and pelvis 11/18/2015-right lower lobe nodule, expansile lytic lesion at the right 11th rib/costal vertebral junction, left renal mass, expansile lesion involving the L4 vertebra, lytic lesion at the left acetabulum, and a low-attenuation liver  lesion Initiation of SRS to L4 12/02/2015, Completed 12/12/2015 Initiation of Pazopanib 12/30/2015 Pazopanib placed on hold 02/06/2016 secondary to elevated liver enzymes Pazopanib resumed 03/07/2016 at a dose of 400 mg daily  Pazopanib discontinued 03/19/2016 secondary to elevated liver enzymes Restaging CTs 04/02/2016-stable left renal mass, decreased soft tissue component associated with the L4 metastasis, increased soft tissue component associated with the right 11th rib metastasis with increased T11 bony destruction, increased sclerosis at the left acetabulum lesion Cycle 1 nivolumab 04/12/2016 Cycle 2 nivolumab 04/26/2016 Cycle 3 nivolumab 05/11/2016 Cycle 4 nivolumab 05/24/2016 Cycle 5 nivolumab 06/08/2016 MRI lumbar spine 06/21/2016-unchanged tumor at L3, increased size of retroperitoneal lymph nodes compared to a CT from 04/02/2016 Cycle 6 nivolumab  06/22/2016 CTs chest, abdomen, and pelvis 07/04/2016-enlargement of the left renal mass, right adrenal nodule, left hilar and peritoneal lymph nodes, enlargement of left acetabular lesion. Stable lung nodules. Cycle 7 nivolumab 07/06/2016 Cycle 8 nivolumab 07/20/2016 Cycle 9 nivolumab 08/02/2016 Cycle 10 nivolumab 08/20/2016 Restaging CT 09/03/2016 evaluation with stable disease Cycle 11 nivolumab 09/05/2016 Cycle 12 nivolumab 09/19/2016 Cycle 13 nivolumab 10/03/2016 Cycle 14 nivolumab 10/17/2016 Cycle 15 nivolumab 10/31/2016 Cycle 16 nivolumab 11/14/2016 (changed to monthly schedule) Cycle 17 nivolumab 12/19/2016 CTs 01/21/2017-increased left renal mass, increased size of adrenal metastases, increased lytic bone lesions, increased left lung nodule, persistent tumor  at L4 with probable epidural component Initiation of Cabozantinib 01/28/2017 Restaging CTs 05/30/2017- decreased size of left hilar mass, left renal mass, retroperitoneal adenopathy, and adrenal metastasis.  Healing bone lesions. Cabozantinib continued CTs 10/21/2017-  interval enlargement left hilar lymph node; stable rib lesions; stable mass left renal cortex; stable mildly nodular adrenal glands; stable lytic lesions within the pelvis and spine. Cabozantinib continued CTs 02/21/2018- enlargement of an AP window lymph node.  Mildly enlarged left hilar lymph node is unchanged.  Primary renal cell carcinoma involving the upper pole of the left kidney appears similar.  Stable enlarged right periaortic lymph node adjacent to the renal vessels.  Stable multifocal bony metastatic disease. Cabozantinib continued CTs 07/29/2018- stable 15 mm AP window nodes; stable left hilar node; subcarinal node slightly larger; right periaortic node 16 mm, previously 12 mm; portacaval node 24 mm, previously 16 mm; 15 mm node superior to the pancreatic head has enlarged; interval increase nodularity of both adrenal glands; stable left kidney mass; multifocal bony metastatic disease not significantly changed. Cabozantinib continued CTs 11/06/2018- moderate improvement in thoracic adenopathy; minimal improvement abdominal adenopathy; left kidney upper pole mass and various lytic expansile bone lesions stable; mild increase in nodularity of left adrenal gland; right adrenal gland nodularity stable. 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  Peripheral enhancing masses consistent with metastases. No hemorrhage. MRI brain 03/11/2019-7 enhancing brain masses consistent with metastatic disease SRS to 7 brain lesions, treatment given 03/19/2019, 03/23/2019, and 03/25/2019 CTs 04/09/2019-decrease in left renal mass, bilateral adrenal nodules, AP window and porta hepatic adenopathy.  New 9 mm right lower lobe nodule.  Improved lytic lesion of the left acetabulum.  Other bone lesions are stable. Cabozantinib continued MRI brain 07/17/2019-resolution of 4 mm treated lesion in the right frontal  cortex, 6 remaining treated lesions have decreased in size, new punctate metastasis in the superior right cerebellum SRS to right cerebellar lesion 07/30/2019 CTs 08/17/2019-previously noted right lower lobe nodule resolved, stable left kidney mass, mixed lytic/sclerotic bone lesions in the thoracolumbar spine, right posterior ribs, and left acetabulum-unchanged, no evidence of progressive disease  Cabozantinib continued MRI brain 10/23/2019-stable to slight decrease in size of multiple enhancing intracranial lesions.  Slight increase in surrounding edema in the medial left frontal lobe.  No new lesions present. MRI brain 01/29/2020-multiple enhancing brain lesions, some hemorrhagic, some with mild enlargement-treatment effect? CTs 02/08/2020-no thoracic metastases, enlargement of the left renal mass, enlargement of lytic lesion at T9, other lytic lesions unchanged MRI brain 05/04/2020-no new lesions, slight enlargement of a right occipital lesion, other lesions are stable or decreased in size CTs 08/11/2020-enlargement of left kidney mass, new 1.6 cm segment 7 liver lesion, enlargement of a lytic lesion at T9, increased lysis of a sclerotic lesion at the right second rib, 0.7 cm endoluminal nodule at the bladder dome-enlarged Brain MRI 08/10/2020-slight increase in a 15 mm left frontal lobe lesion, new punctate focus in the right occipital cortex, other lesions stable or slightly decreased CTs 11/01/2020-increase in left suprahilar opacity, enlargement of previous liver metastases, several new subcentimeter lesions, increase in left renal mass, similar appearance of bone metastases, stable hyperdense/enhancing nodule in the left bladder Brain MRI 11/04/2020-no change in brain metastases Lenvatinib/pembrolizumab 11/30/2020 CTs 03/03/2021-stable left hilar lymph nodes; resolution of left upper lobe perihilar nodularity; unchanged for millimeter peripheral right upper lobe nodule; multiple lytic bone lesions appear  stable; left kidney lesion decreased in size; bilateral adrenal nodules decreased  in size; several liver lesion showed mild increase in size; several new small lesions within the right hepatic lobe. Lenvatinib/pembrolizumab continued 03/07/2021 Brain MRI 03/10/2021-progression of a left frontal metastasis, tiny new metastasis in the right frontal lobe-referred for SBRT to the right frontal lesion and to [facility] to consider a LITT procedure for the progressive left frontal lesion SRS 04/06/2021, LITT procedure at [facility] 04/18/2021-pathology metastatic renal cell carcinoma in background of necrosis/gliosis Lenvatinib on hold Pembrolizumab held 04/24/2021 Pembrolizumab 05/01/2021, lenvatinib remains on hold pending surgical evaluation Pembrolizumab 05/25/2021, lenvatinib resumed Pembrolizumab 06/15/2021 Lenvima resumed 06/21/2021 CTs 07/28/2021-continued regression left renal lesion.  Much improved appearance of the liver.  Continued regression of bilateral adrenal gland nodules.  Stable mixed lytic and sclerotic metastatic bone disease.  No new or progressive findings. Pembrolizumab 07/31/2021, lenvatinib continued Pembrolizumab held 08/21/2021 due to generalized weakness Pembrolizumab resumed 09/11/2021 MRI brain 10/19/2021-"new "focus of minimal enhancement of the right frontal lobe, larger punctate right cerebellar lesion, review of MRI found the right frontal lesion was present in 2021 and was treated with SRS CTs 11/12/2021-2 new right upper lung nodules, stable left renal lesion, enlarging lytic lesion in the left fourth rib Pembrolizumab and lenvatinib continued Brain 01/02/2022-new 4 mm lesion in the right parietal cortex the largest lesion in left frontal lobe stable in size with more solid  CTs chest, abdomen, and pelvis-increased soft tissue component of a lytic left acetabular Tasso cysts and increased sclerotic component of the left fourth rib metastasis, decreased conspicuity of right upper lobe  nodules, no change in renal mass Pembrolizumab and lenvatinib continued MRI brain 02/28/2022-minimal increase size of a 3 mm right cerebellar lesion, increased size of enhancing mass at the left frontal lobe measuring 3 x 2.2 cm with adjacent gyriform enhancement and mild worsening of edema in the left frontal white matter   Pain secondary to #1-managed by Dr. Lovenia Shuck.  Improved Hypertension Elevated transaminases 02/06/2016- Pazopanib placed on hold Liver enzymes normal 03/07/2016 Port-A-Cath placement 05/16/2016 Malaise/anorexia 09/05/2016. Cortisol and testosterone levels low. Hydrocortisone and testosterone replacement initiated. Conjunctival/scleral erythema 09/19/2016-resolved with steroid eyedrops Proximal right leg weakness. 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 Hypercalcemia status post Zometa 01/23/2017-resolved Hypercalcemia 03/29/2022-Zometa Pruritic rash following IV contrast 10/21/2017; rash following IV contrast 02/21/2018 despite prednisone/Benadryl premedication Brief episodes of expressive aphasia October and December 2020 Vitamin B12 deficiency confirmed on lab 03/07/2021-started oral vitamin B12 replacement 03/28/2021 Admission 01/08/2022 with acute cholecystitis/sepsis, cholecystostomy tube         Disposition: Phillip [name] has metastatic renal cell carcinoma.  He has been maintained on treatment with pembrolizumab and lenvatinib it is October 2022.  The restaging brain MRI earlier this week reveals an increased size of a left frontal lobe mass with associated edema.  He has hypercalcemia today.  Lenvatinib and pembrolizumab will be discontinued.  Phillip. [name] has a worsening performance status with altered mental status.  I suspect his symptoms are related to the brain metastases, brain radiation, and hypercalcemia. He will receive intravenous hydration and Zometa today.  We discontinued hydrocortisone and he will begin Decadron.  We will  arrange for a repeat chemistry panel when he is seen and radiation on 04/02/2022.  He will return for an office visit here next week.  I discussed the poor prognosis with his wife.  She agrees to a home palliative care referral.  She does want him to enroll in hospice at present.  We began a discussion regarding CODE STATUS.  Betsy Coder,  MD  03/29/2022  11:17 AM

## 2022-03-29 NOTE — Patient Instructions (Signed)
Zoledronic Acid Injection (Cancer) What is this medication? ZOLEDRONIC ACID (ZOE le dron ik AS id) treats high calcium levels in the blood caused by cancer. It may also be used with chemotherapy to treat weakened bones caused by cancer. It works by slowing down the release of calcium from bones. This lowers calcium levels in your blood. It also makes your bones stronger and less likely to break (fracture). It belongs to a group of medications called bisphosphonates. This medicine may be used for other purposes; ask your health care provider or pharmacist if you have questions. COMMON BRAND NAME(S): Zometa, Zometa Powder What should I tell my care team before I take this medication? They need to know if you have any of these conditions: Dehydration Dental disease Kidney disease Liver disease Low levels of calcium in the blood Lung or breathing disease, such as asthma Receiving steroids, such as dexamethasone or prednisone An unusual or allergic reaction to zoledronic acid, other medications, foods, dyes, or preservatives Pregnant or trying to get pregnant Breast-feeding How should I use this medication? This medication is injected into a vein. It is given by your care team in a hospital or clinic setting. Talk to your care team about the use of this medication in children. Special care may be needed. Overdosage: If you think you have taken too much of this medicine contact a poison control center or emergency room at once. NOTE: This medicine is only for you. Do not share this medicine with others. What if I miss a dose? Keep appointments for follow-up doses. It is important not to miss your dose. Call your care team if you are unable to keep an appointment. What may interact with this medication? Certain antibiotics given by injection Diuretics, such as bumetanide, furosemide NSAIDs, medications for pain and inflammation, such as ibuprofen or naproxen Teriparatide Thalidomide This list  may not describe all possible interactions. Give your health care provider a list of all the medicines, herbs, non-prescription drugs, or dietary supplements you use. Also tell them if you smoke, drink alcohol, or use illegal drugs. Some items may interact with your medicine. What should I watch for while using this medication? Visit your care team for regular checks on your progress. It may be some time before you see the benefit from this medication. Some people who take this medication have severe bone, joint, or muscle pain. This medication may also increase your risk for jaw problems or a broken thigh bone. Tell your care team right away if you have severe pain in your jaw, bones, joints, or muscles. Tell you care team if you have any pain that does not go away or that gets worse. Tell your dentist and dental surgeon that you are taking this medication. You should not have major dental surgery while on this medication. See your dentist to have a dental exam and fix any dental problems before starting this medication. Take good care of your teeth while on this medication. Make sure you see your dentist for regular follow-up appointments. You should make sure you get enough calcium and vitamin D while you are taking this medication. Discuss the foods you eat and the vitamins you take with your care team. Check with your care team if you have severe diarrhea, nausea, and vomiting, or if you sweat a lot. The loss of too much body fluid may make it dangerous for you to take this medication. You may need bloodwork while taking this medication. Talk to your care team if   you wish to become pregnant or think you might be pregnant. This medication can cause serious birth defects. What side effects may I notice from receiving this medication? Side effects that you should report to your care team as soon as possible: Allergic reactions--skin rash, itching, hives, swelling of the face, lips, tongue, or  throat Kidney injury--decrease in the amount of urine, swelling of the ankles, hands, or feet Low calcium level--muscle pain or cramps, confusion, tingling, or numbness in the hands or feet Osteonecrosis of the jaw--pain, swelling, or redness in the mouth, numbness of the jaw, poor healing after dental work, unusual discharge from the mouth, visible bones in the mouth Severe bone, joint, or muscle pain Side effects that usually do not require medical attention (report to your care team if they continue or are bothersome): Constipation Fatigue Fever Loss of appetite Nausea Stomach pain This list may not describe all possible side effects. Call your doctor for medical advice about side effects. You may report side effects to FDA at 1-800-FDA-1088. Where should I keep my medication? This medication is given in a hospital or clinic. It will not be stored at home. NOTE: This sheet is a summary. It may not cover all possible information. If you have questions about this medicine, talk to your doctor, pharmacist, or health care provider.  2023 Elsevier/Gold Standard (2021-03-30 00:00:00)  Rehydration, Older Adult  Rehydration is the replacement of fluids, salts, and minerals in the body (electrolytes) that are lost during dehydration. Dehydration is when there is not enough water or other fluids in the body. This happens when you lose more fluids than you take in. People who are age 78 or older have a higher risk of dehydration than younger adults. This is because in older age, the body: Is less able to maintain the right amount of water. Does not respond to temperature changes as well. Does not get a sense of thirst as easily or quickly. Other causes include: Not drinking enough fluids. This can occur when you are ill, when you forget to drink, or when you are doing activities that require a lot of energy, especially in hot weather. Conditions that cause loss of water or other fluids. These  include diarrhea, vomiting, sweating, or urinating a lot. Other illnesses, such as fever or infection. Certain medicines, such as those that remove excess fluid from the body (diuretics). Symptoms of mild or moderate dehydration may include thirst, dry lips and mouth, and dizziness. Symptoms of severe dehydration may include increased heart rate, confusion, fainting, and not urinating. In severe cases, you may need to get fluids through an IV at the hospital. For mild or moderate cases, you can usually rehydrate at home by drinking certain fluids as told by your health care provider. What are the risks? Rehydration is usually safe. Taking in too much fluid (overhydration) can be a problem but is rare. Overhydration can cause an imbalance of electrolytes in the body, kidney failure, fluid in the lungs, or a decrease in salt (sodium) levels in the body. Supplies needed: You will need an oral rehydration solution (ORS) if your health care provider tells you to use one. This is a drink to treat dehydration. It can be found in pharmacies and retail stores. How to rehydrate Fluids Follow instructions from your health care provider about what to drink. The kind of fluid and the amount you should drink depend on your condition. In general, you should choose drinks that you prefer. If told by your health  care provider, drink an ORS. Make an ORS by following instructions on the package. Start by drinking small amounts, about  cup (120 mL) every 5-10 minutes. Slowly increase how much you drink until you have taken in the amount recommended by your health care provider. Drink enough clear fluids to keep your urine pale yellow. If you were told to drink an ORS, finish it first, then start slowly drinking other clear fluids. Drink fluids such as: Water. This includes sparkling and flavored water. Drinking only water can lead to having too little sodium in your body (hyponatremia). Follow the advice of your  health care provider. Water from ice chips you suck on. Fruit juice with water added to it(diluted). Sports drinks. Hot or cold herbal teas. Broth-based soups. Coffee. Milk or milk products. Food Follow instructions from your health care provider about what to eat while you rehydrate. Your health care provider may recommend that you slowly begin eating regular foods in small amounts. Eat foods that contain a healthy balance of electrolytes, such as bananas, oranges, potatoes, tomatoes, and spinach. Avoid foods that are greasy or contain a lot of sugar. In some cases, you may get nutrition through a feeding tube that is passed through your nose and into your stomach (nasogastric tube, or NG tube). This may be done if you have uncontrolled vomiting or diarrhea. Drinks to avoid  Certain drinks may make dehydration worse. While you rehydrate, avoid drinking alcohol. How to tell if you are recovering from dehydration You may be getting better if: You are urinating more often than before you started rehydrating. Your urine is pale yellow. Your energy level improves. You vomit less often. You have diarrhea less often. Your appetite improves or returns to normal. You feel less dizzy or light-headed. Your skin tone and color start to look more normal. Follow these instructions at home: Take over-the-counter and prescription medicines only as told by your health care provider. Do not take sodium tablets. Doing this can lead to having too much sodium in your body (hypernatremia). Contact a health care provider if: You continue to have symptoms of mild or moderate dehydration, such as: Thirst. Dry lips. Slightly dry mouth. Dizziness. Dark urine or less urine than usual. Muscle cramps. You continue to vomit or have diarrhea. Get help right away if: You have symptoms of dehydration that get worse. You have a fever. You have a severe headache. You have been vomiting and have problems, such  as: Your vomiting gets worse. Your vomit includes blood or green matter (bile). You cannot eat or drink without vomiting. You have problems with urination or bowel movements, such as: Diarrhea that gets worse. Blood in your stool (feces). This may cause stool to look black and tarry. Not urinating, or urinating only a small amount of very dark urine, within 6-8 hours. You have trouble breathing. You have symptoms that get worse with treatment. These symptoms may be an emergency. Get help right away. Call 911. Do not wait to see if the symptoms will go away. Do not drive yourself to the hospital. This information is not intended to replace advice given to you by your health care provider. Make sure you discuss any questions you have with your health care provider. Document Revised: 06/28/2021 Document Reviewed: 06/26/2021 Elsevier Patient Education  [place].

## 2022-03-30 ENCOUNTER — Other Ambulatory Visit (HOSPITAL_COMMUNITY): Payer: Self-pay

## 2022-03-30 ENCOUNTER — Other Ambulatory Visit: Payer: Self-pay

## 2022-03-30 ENCOUNTER — Inpatient Hospital Stay (HOSPITAL_COMMUNITY)
Admission: EM | Admit: 2022-03-30 | Discharge: 2022-04-30 | DRG: 071 | Disposition: A | Discharge status: Skilled Nursing Facility | Payer: PPO | Attending: Family Medicine | Admitting: Family Medicine

## 2022-03-30 ENCOUNTER — Emergency Department (HOSPITAL_COMMUNITY): Payer: PPO

## 2022-03-30 ENCOUNTER — Inpatient Hospital Stay (HOSPITAL_COMMUNITY): Payer: PPO

## 2022-03-30 ENCOUNTER — Encounter (HOSPITAL_COMMUNITY): Payer: Self-pay

## 2022-03-30 DIAGNOSIS — I82442 Acute embolism and thrombosis of left tibial vein: Secondary | ICD-10-CM | POA: Diagnosis present

## 2022-03-30 DIAGNOSIS — E669 Obesity, unspecified: Secondary | ICD-10-CM | POA: Diagnosis present

## 2022-03-30 DIAGNOSIS — G9341 Metabolic encephalopathy: Principal | ICD-10-CM | POA: Diagnosis present

## 2022-03-30 DIAGNOSIS — G8929 Other chronic pain: Secondary | ICD-10-CM | POA: Diagnosis present

## 2022-03-30 DIAGNOSIS — M1711 Unilateral primary osteoarthritis, right knee: Secondary | ICD-10-CM | POA: Diagnosis present

## 2022-03-30 DIAGNOSIS — Z6831 Body mass index (BMI) 31.0-31.9, adult: Secondary | ICD-10-CM

## 2022-03-30 DIAGNOSIS — E871 Hypo-osmolality and hyponatremia: Secondary | ICD-10-CM | POA: Diagnosis present

## 2022-03-30 DIAGNOSIS — Z1152 Encounter for screening for COVID-19: Secondary | ICD-10-CM | POA: Diagnosis not present

## 2022-03-30 DIAGNOSIS — C7970 Secondary malignant neoplasm of unspecified adrenal gland: Secondary | ICD-10-CM | POA: Diagnosis present

## 2022-03-30 DIAGNOSIS — C641 Malignant neoplasm of right kidney, except renal pelvis: Secondary | ICD-10-CM | POA: Diagnosis not present

## 2022-03-30 DIAGNOSIS — Z9221 Personal history of antineoplastic chemotherapy: Secondary | ICD-10-CM

## 2022-03-30 DIAGNOSIS — N433 Hydrocele, unspecified: Secondary | ICD-10-CM | POA: Diagnosis present

## 2022-03-30 DIAGNOSIS — I82452 Acute embolism and thrombosis of left peroneal vein: Secondary | ICD-10-CM | POA: Diagnosis present

## 2022-03-30 DIAGNOSIS — I82409 Acute embolism and thrombosis of unspecified deep veins of unspecified lower extremity: Secondary | ICD-10-CM | POA: Insufficient documentation

## 2022-03-30 DIAGNOSIS — D630 Anemia in neoplastic disease: Secondary | ICD-10-CM | POA: Diagnosis present

## 2022-03-30 DIAGNOSIS — I82462 Acute embolism and thrombosis of left calf muscular vein: Secondary | ICD-10-CM | POA: Diagnosis present

## 2022-03-30 DIAGNOSIS — R310 Gross hematuria: Secondary | ICD-10-CM | POA: Diagnosis present

## 2022-03-30 DIAGNOSIS — I82402 Acute embolism and thrombosis of unspecified deep veins of left lower extremity: Secondary | ICD-10-CM

## 2022-03-30 DIAGNOSIS — I10 Essential (primary) hypertension: Secondary | ICD-10-CM | POA: Diagnosis present

## 2022-03-30 DIAGNOSIS — C649 Malignant neoplasm of unspecified kidney, except renal pelvis: Secondary | ICD-10-CM | POA: Diagnosis present

## 2022-03-30 DIAGNOSIS — D631 Anemia in chronic kidney disease: Secondary | ICD-10-CM | POA: Diagnosis present

## 2022-03-30 DIAGNOSIS — R195 Other fecal abnormalities: Secondary | ICD-10-CM | POA: Diagnosis present

## 2022-03-30 DIAGNOSIS — Z7189 Other specified counseling: Secondary | ICD-10-CM | POA: Diagnosis not present

## 2022-03-30 DIAGNOSIS — F419 Anxiety disorder, unspecified: Secondary | ICD-10-CM | POA: Diagnosis present

## 2022-03-30 DIAGNOSIS — D649 Anemia, unspecified: Secondary | ICD-10-CM | POA: Insufficient documentation

## 2022-03-30 DIAGNOSIS — I82412 Acute embolism and thrombosis of left femoral vein: Secondary | ICD-10-CM | POA: Diagnosis present

## 2022-03-30 DIAGNOSIS — G40909 Epilepsy, unspecified, not intractable, without status epilepticus: Secondary | ICD-10-CM

## 2022-03-30 DIAGNOSIS — R339 Retention of urine, unspecified: Secondary | ICD-10-CM | POA: Diagnosis present

## 2022-03-30 DIAGNOSIS — Z79891 Long term (current) use of opiate analgesic: Secondary | ICD-10-CM

## 2022-03-30 DIAGNOSIS — Z91041 Radiographic dye allergy status: Secondary | ICD-10-CM

## 2022-03-30 DIAGNOSIS — Z66 Do not resuscitate: Secondary | ICD-10-CM | POA: Diagnosis present

## 2022-03-30 DIAGNOSIS — Z515 Encounter for palliative care: Secondary | ICD-10-CM | POA: Diagnosis not present

## 2022-03-30 DIAGNOSIS — F32A Depression, unspecified: Secondary | ICD-10-CM | POA: Diagnosis present

## 2022-03-30 DIAGNOSIS — C7951 Secondary malignant neoplasm of bone: Secondary | ICD-10-CM | POA: Diagnosis present

## 2022-03-30 DIAGNOSIS — C7952 Secondary malignant neoplasm of bone marrow: Secondary | ICD-10-CM | POA: Diagnosis present

## 2022-03-30 DIAGNOSIS — E538 Deficiency of other specified B group vitamins: Secondary | ICD-10-CM | POA: Diagnosis present

## 2022-03-30 DIAGNOSIS — R6510 Systemic inflammatory response syndrome (SIRS) of non-infectious origin without acute organ dysfunction: Secondary | ICD-10-CM | POA: Diagnosis not present

## 2022-03-30 DIAGNOSIS — R627 Adult failure to thrive: Secondary | ICD-10-CM | POA: Diagnosis present

## 2022-03-30 DIAGNOSIS — R54 Age-related physical debility: Secondary | ICD-10-CM | POA: Diagnosis present

## 2022-03-30 DIAGNOSIS — Z95828 Presence of other vascular implants and grafts: Secondary | ICD-10-CM

## 2022-03-30 DIAGNOSIS — M25561 Pain in right knee: Secondary | ICD-10-CM | POA: Diagnosis present

## 2022-03-30 DIAGNOSIS — L89152 Pressure ulcer of sacral region, stage 2: Secondary | ICD-10-CM | POA: Diagnosis not present

## 2022-03-30 DIAGNOSIS — Z79890 Hormone replacement therapy (postmenopausal): Secondary | ICD-10-CM

## 2022-03-30 DIAGNOSIS — D509 Iron deficiency anemia, unspecified: Secondary | ICD-10-CM | POA: Diagnosis present

## 2022-03-30 DIAGNOSIS — Z79899 Other long term (current) drug therapy: Secondary | ICD-10-CM

## 2022-03-30 DIAGNOSIS — R6520 Severe sepsis without septic shock: Secondary | ICD-10-CM | POA: Diagnosis not present

## 2022-03-30 DIAGNOSIS — R262 Difficulty in walking, not elsewhere classified: Secondary | ICD-10-CM | POA: Diagnosis present

## 2022-03-30 DIAGNOSIS — Z7952 Long term (current) use of systemic steroids: Secondary | ICD-10-CM

## 2022-03-30 DIAGNOSIS — R531 Weakness: Secondary | ICD-10-CM | POA: Diagnosis present

## 2022-03-30 DIAGNOSIS — N179 Acute kidney failure, unspecified: Secondary | ICD-10-CM | POA: Diagnosis present

## 2022-03-30 DIAGNOSIS — R569 Unspecified convulsions: Secondary | ICD-10-CM

## 2022-03-30 DIAGNOSIS — I82432 Acute embolism and thrombosis of left popliteal vein: Secondary | ICD-10-CM | POA: Diagnosis present

## 2022-03-30 DIAGNOSIS — Y846 Urinary catheterization as the cause of abnormal reaction of the patient, or of later complication, without mention of misadventure at the time of the procedure: Secondary | ICD-10-CM | POA: Diagnosis not present

## 2022-03-30 DIAGNOSIS — C7801 Secondary malignant neoplasm of right lung: Secondary | ICD-10-CM | POA: Diagnosis present

## 2022-03-30 DIAGNOSIS — E876 Hypokalemia: Secondary | ICD-10-CM | POA: Diagnosis present

## 2022-03-30 DIAGNOSIS — C787 Secondary malignant neoplasm of liver and intrahepatic bile duct: Secondary | ICD-10-CM | POA: Diagnosis present

## 2022-03-30 DIAGNOSIS — A419 Sepsis, unspecified organism: Principal | ICD-10-CM

## 2022-03-30 DIAGNOSIS — C7931 Secondary malignant neoplasm of brain: Secondary | ICD-10-CM | POA: Diagnosis present

## 2022-03-30 DIAGNOSIS — Z923 Personal history of irradiation: Secondary | ICD-10-CM

## 2022-03-30 DIAGNOSIS — T8383XA Hemorrhage of genitourinary prosthetic devices, implants and grafts, initial encounter: Secondary | ICD-10-CM | POA: Diagnosis not present

## 2022-03-30 DIAGNOSIS — Z85528 Personal history of other malignant neoplasm of kidney: Secondary | ICD-10-CM

## 2022-03-30 DIAGNOSIS — R609 Edema, unspecified: Secondary | ICD-10-CM | POA: Diagnosis not present

## 2022-03-30 DIAGNOSIS — R7989 Other specified abnormal findings of blood chemistry: Secondary | ICD-10-CM | POA: Diagnosis present

## 2022-03-30 DIAGNOSIS — Z9049 Acquired absence of other specified parts of digestive tract: Secondary | ICD-10-CM

## 2022-03-30 DIAGNOSIS — D75839 Thrombocytosis, unspecified: Secondary | ICD-10-CM | POA: Diagnosis present

## 2022-03-30 LAB — CBC WITH DIFFERENTIAL/PLATELET
Abs Immature Granulocytes: 0.18 10*3/uL — ABNORMAL HIGH (ref 0.00–0.07)
Basophils Absolute: 0.1 10*3/uL (ref 0.0–0.1)
Basophils Relative: 1 %
Eosinophils Absolute: 0.5 10*3/uL (ref 0.0–0.5)
Eosinophils Relative: 3 %
HCT: 33.7 % — ABNORMAL LOW (ref 39.0–52.0)
Hemoglobin: 9.6 g/dL — ABNORMAL LOW (ref 13.0–17.0)
Immature Granulocytes: 1 %
Lymphocytes Relative: 13 %
Lymphs Abs: 1.8 10*3/uL (ref 0.7–4.0)
MCH: 25.1 pg — ABNORMAL LOW (ref 26.0–34.0)
MCHC: 28.5 g/dL — ABNORMAL LOW (ref 30.0–36.0)
MCV: 88 fL (ref 80.0–100.0)
Monocytes Absolute: 0.9 10*3/uL (ref 0.1–1.0)
Monocytes Relative: 7 %
Neutro Abs: 10.3 10*3/uL — ABNORMAL HIGH (ref 1.7–7.7)
Neutrophils Relative %: 75 %
Platelets: 530 10*3/uL — ABNORMAL HIGH (ref 150–400)
RBC: 3.83 MIL/uL — ABNORMAL LOW (ref 4.22–5.81)
RDW: 18.3 % — ABNORMAL HIGH (ref 11.5–15.5)
WBC: 13.8 10*3/uL — ABNORMAL HIGH (ref 4.0–10.5)
nRBC: 0 % (ref 0.0–0.2)

## 2022-03-30 LAB — COMPREHENSIVE METABOLIC PANEL
ALT: 86 U/L — ABNORMAL HIGH (ref 0–44)
AST: 47 U/L — ABNORMAL HIGH (ref 15–41)
Albumin: 2.6 g/dL — ABNORMAL LOW (ref 3.5–5.0)
Alkaline Phosphatase: 172 U/L — ABNORMAL HIGH (ref 38–126)
Anion gap: 11 (ref 5–15)
BUN: 12 mg/dL (ref 8–23)
CO2: 27 mmol/L (ref 22–32)
Calcium: 10.6 mg/dL — ABNORMAL HIGH (ref 8.9–10.3)
Chloride: 99 mmol/L (ref 98–111)
Creatinine, Ser: 0.71 mg/dL (ref 0.61–1.24)
GFR, Estimated: >60 mL/min (ref >60)
Glucose, Bld: 115 mg/dL — ABNORMAL HIGH (ref 70–99)
Potassium: 4.2 mmol/L (ref 3.5–5.1)
Sodium: 137 mmol/L (ref 135–145)
Total Bilirubin: 0.7 mg/dL (ref 0.3–1.2)
Total Protein: 6.9 g/dL (ref 6.5–8.1)

## 2022-03-30 LAB — URINALYSIS, ROUTINE W REFLEX MICROSCOPIC
Bilirubin Urine: NEGATIVE
Glucose, UA: NEGATIVE mg/dL
Hgb urine dipstick: NEGATIVE
Ketones, ur: NEGATIVE mg/dL
Leukocytes,Ua: NEGATIVE
Nitrite: NEGATIVE
Protein, ur: 30 mg/dL — AB
Specific Gravity, Urine: 1.015 (ref 1.005–1.030)
pH: 6 (ref 5.0–8.0)

## 2022-03-30 LAB — LIPASE, BLOOD: Lipase: 29 U/L (ref 11–51)

## 2022-03-30 LAB — APTT: aPTT: 35 seconds (ref 24–36)

## 2022-03-30 LAB — RESP PANEL BY RT-PCR (RSV, FLU A&B, COVID)  RVPGX2
Influenza A by PCR: NEGATIVE
Influenza B by PCR: NEGATIVE
Resp Syncytial Virus by PCR: NEGATIVE
SARS Coronavirus 2 by RT PCR: NEGATIVE

## 2022-03-30 LAB — PROTIME-INR
INR: 1.3 — ABNORMAL HIGH (ref 0.8–1.2)
Prothrombin Time: 15.7 seconds — ABNORMAL HIGH (ref 11.4–15.2)

## 2022-03-30 LAB — LACTIC ACID, PLASMA: Lactic Acid, Venous: 1.7 mmol/L (ref 0.5–1.9)

## 2022-03-30 MED ORDER — TESTOSTERONE 1.62 % TD GEL: 2.0000 | Freq: Every day | TRANSDERMAL | Status: DC

## 2022-03-30 MED ORDER — METRONIDAZOLE 500 MG/100ML IV SOLN
500.0000 mg | Freq: Once | INTRAVENOUS | Status: AC
  Administered 2022-03-30: 500 mg via INTRAVENOUS
  Filled 2022-03-30: qty 100

## 2022-03-30 MED ORDER — VANCOMYCIN HCL 1500 MG/300ML IV SOLN: 1500.0000 mg | INTRAVENOUS | Status: DC

## 2022-03-30 MED ORDER — VANCOMYCIN HCL IN DEXTROSE 1-5 GM/200ML-% IV SOLN: 1000.0000 mg | Freq: Once | INTRAVENOUS | Status: DC

## 2022-03-30 MED ORDER — PROCHLORPERAZINE EDISYLATE 10 MG/2ML IJ SOLN: 10.0000 mg | Freq: Four times a day (QID) | INTRAMUSCULAR | Status: DC | PRN

## 2022-03-30 MED ORDER — HYDRALAZINE HCL 20 MG/ML IJ SOLN
10.0000 mg | Freq: Three times a day (TID) | INTRAMUSCULAR | Status: DC | PRN
  Administered 2022-03-31: 10 mg via INTRAVENOUS
  Filled 2022-03-30: qty 1

## 2022-03-30 MED ORDER — MORPHINE SULFATE (PF) 2 MG/ML IV SOLN
2.0000 mg | INTRAVENOUS | Status: DC | PRN
  Administered 2022-03-30 – 2022-03-31 (×2): 2 mg via INTRAVENOUS
  Filled 2022-03-30 (×2): qty 1

## 2022-03-30 MED ORDER — LACTATED RINGERS IV BOLUS (SEPSIS)
1000.0000 mL | Freq: Once | INTRAVENOUS | Status: AC
  Administered 2022-03-30: 1000 mL via INTRAVENOUS

## 2022-03-30 MED ORDER — ACETAMINOPHEN 650 MG RE SUPP: 650.0000 mg | Freq: Four times a day (QID) | RECTAL | Status: DC | PRN

## 2022-03-30 MED ORDER — VANCOMYCIN HCL 2000 MG/400ML IV SOLN
2000.0000 mg | Freq: Once | INTRAVENOUS | Status: AC
  Administered 2022-03-30: 2000 mg via INTRAVENOUS
  Filled 2022-03-30: qty 400

## 2022-03-30 MED ORDER — TECHNETIUM TC 99M MEBROFENIN IV KIT
5.3000 | PACK | Freq: Once | INTRAVENOUS | Status: AC | PRN
  Administered 2022-03-30: 5.3 via INTRAVENOUS

## 2022-03-30 MED ORDER — LACTATED RINGERS IV BOLUS (SEPSIS)
500.0000 mL | Freq: Once | INTRAVENOUS | Status: AC
  Administered 2022-03-30: 500 mL via INTRAVENOUS

## 2022-03-30 MED ORDER — LACTATED RINGERS IV SOLN: INTRAVENOUS | Status: DC

## 2022-03-30 MED ORDER — ACETAMINOPHEN 325 MG PO TABS
650.0000 mg | ORAL_TABLET | Freq: Once | ORAL | Status: AC
  Administered 2022-03-30: 650 mg via ORAL
  Filled 2022-03-30: qty 2

## 2022-03-30 MED ORDER — TESTOSTERONE 50 MG/5GM (1%) TD GEL
5.0000 g | Freq: Every day | TRANSDERMAL | Status: DC
  Administered 2022-03-31 – 2022-04-30 (×31): 5 g via TRANSDERMAL
  Filled 2022-03-30 (×27): qty 5

## 2022-03-30 MED ORDER — SODIUM CHLORIDE 0.9 % IV SOLN
2.0000 g | Freq: Once | INTRAVENOUS | Status: AC
  Administered 2022-03-30: 2 g via INTRAVENOUS
  Filled 2022-03-30: qty 12.5

## 2022-03-30 MED ORDER — METRONIDAZOLE 500 MG/100ML IV SOLN
500.0000 mg | Freq: Two times a day (BID) | INTRAVENOUS | Status: DC
  Administered 2022-03-30 – 2022-04-05 (×13): 500 mg via INTRAVENOUS
  Filled 2022-03-30 (×14): qty 100

## 2022-03-30 MED ORDER — SODIUM CHLORIDE 0.9 % IV SOLN
2.0000 g | Freq: Three times a day (TID) | INTRAVENOUS | Status: DC
  Administered 2022-03-30 – 2022-04-05 (×16): 2 g via INTRAVENOUS
  Filled 2022-03-30 (×16): qty 12.5

## 2022-03-30 MED ORDER — METRONIDAZOLE 500 MG/100ML IV SOLN: 500.0000 mg | Freq: Two times a day (BID) | INTRAVENOUS | Status: DC

## 2022-03-30 MED ORDER — SODIUM CHLORIDE 0.9 % IV SOLN
250.0000 mg | Freq: Two times a day (BID) | INTRAVENOUS | Status: DC
  Administered 2022-03-30 – 2022-04-05 (×12): 250 mg via INTRAVENOUS
  Filled 2022-03-30 (×15): qty 2.5

## 2022-03-30 MED ORDER — DEXAMETHASONE SODIUM PHOSPHATE 4 MG/ML IJ SOLN
4.0000 mg | Freq: Every day | INTRAMUSCULAR | Status: DC
  Administered 2022-03-30 – 2022-04-04 (×6): 4 mg via INTRAVENOUS
  Filled 2022-03-30 (×6): qty 1

## 2022-03-30 NOTE — Progress Notes (Signed)
A consult was received from an ED physician for vancomycin + cefepime per pharmacy dosing.  The patient's profile has been reviewed for ht/wt/allergies/indication/available labs.    A one time order has been placed for cefepime 2 g IV once and vancomycin 2000 mg IV once.  Further antibiotics/pharmacy consults should be ordered by admitting physician if indicated.                       Thank you, Lenis Noon, PharmD 03/30/2022  10:39 AM

## 2022-03-30 NOTE — Progress Notes (Signed)
IP PROGRESS NOTE  Subjective:   Phillip [name] is well-known to me with a history of metastatic renal cell carcinoma, mostly recently treated with lenvatinib and pembrolizumab.  He underwent a cholecystectomy on 03/15/2022.  I saw him in the office yesterday.  He had failure to thrive felt to be secondary to hypercalcemia and progressive brain metastases.  He received intravenous hydration and Zometa.  We decided to discontinue lenvatinib and pembrolizumab.  We made a referral to home palliative care via the [facility] program.  His wife reports his energy level improved after the IV fluids yesterday, but is worse again today.  He presented to the emergency room for further evaluation.  He was found to have a temperature of 101.9 degrees.  He is admitted limits suspected sepsis.  Phillip [name] complains of abdominal pain.    Objective: Vital signs in last 24 hours: Blood pressure (!) 152/71, pulse 78, temperature (!) 97.4 F (36.3 C), temperature source Oral, resp. rate (!) 22, height '5\' 11"'$  (1.803 m), weight 225 lb (102.1 kg), SpO2 98 %.  Intake/Output from previous day: No intake/output data recorded.  Physical Exam:  HEENT: The tongue is dry,, no thrush Lungs: Bilateral expiratory wheeze, decreased breath sounds at the right compared to the left chest, no respiratory distress Cardiac: Regular rate and rhythm Abdomen: Mildly distended, healed surgical incisions, no mass, nontender Extremities: No leg edema Neurologic: Alert, moves all extremities to command, knew my name, oriented to place  Portacath/PICC-without erythema  Lab Results: Recent Labs    03/29/22 1031 03/30/22 1138  WBC 16.4* 13.8*  HGB 9.6* 9.6*  HCT 31.8* 33.7*  PLT 623* 530*    BMET Recent Labs    03/29/22 1031 03/30/22 1138  NA 137 137  K 4.4 4.2  CL 100 99  CO2 29 27  GLUCOSE 170* 115*  BUN 16 12  CREATININE 0.56* 0.71  CALCIUM 11.8* 10.6*    Lab Results  Component Value Date   CEA1 1.77  01/04/2016    Studies/Results: CT CHEST ABDOMEN PELVIS WO CONTRAST  Result Date: 03/30/2022 CLINICAL DATA:  Sepsis. Renal cell carcinoma. Lumbar metastasis. * Tracking Code: BO * EXAM: CT CHEST, ABDOMEN AND PELVIS WITHOUT CONTRAST TECHNIQUE: Multidetector CT imaging of the chest, abdomen and pelvis was performed following the standard protocol without IV contrast. RADIATION DOSE REDUCTION: This exam was performed according to the departmental dose-optimization program which includes automated exposure control, adjustment of the mA and/or kV according to patient size and/or use of iterative reconstruction technique. COMPARISON:  None Available. FINDINGS: CT CHEST FINDINGS Cardiovascular: Coronary artery calcification and aortic atherosclerotic calcification. Port in the anterior chest wall with tip in distal SVC. Mediastinum/Nodes: No axillary or supraclavicular adenopathy. No mediastinal or hilar adenopathy. No pericardial fluid. Esophagus normal. Lungs/Pleura: New nodule in the medial RIGHT lung apex measuring 8 mm (49/8) new nodule in the inferior LEFT lower lobe measuring 5 mm (is 112/8) New nodule along the LEFT oblique fissure medially measures 5 mm (60/8) Musculoskeletal: Again demonstrated expansile lytic lesion in the medial RIGHT second rib (image 35/8. Similar pattern in the medial eleventh rib. Metastatic pattern within the first vertebral body additionally. No new lesions are present. CT ABDOMEN AND PELVIS FINDINGS Hepatobiliary: No focal lesion on noncontrast exam. Postcholecystectomy. There is small a collection the gallbladder fossa (image 63/2). This collection measures 21 mm x 18 mm. Pancreas: Pancreas is normal. Spleen: Normal spleen Adrenals/urinary tract: Adrenal glands and kidneys are normal. The ureters and bladder normal. Stomach/Bowel: Stomach,  small bowel, appendix, and cecum are normal. The colon and rectosigmoid colon are normal. Vascular/Lymphatic: Abdominal aorta is normal caliber.  There is no retroperitoneal or periportal lymphadenopathy. No pelvic lymphadenopathy. Reproductive: Other: No free fluid. Musculoskeletal: The metastatic lesion in the LEFT acetabulum appears increased in volume measuring 4.8 x 3.3 cm compared to 3.42.5 cm. This lesion is lytic with soft tissue expansion in the operator space. Stable L4 lumbar metastasis. IMPRESSION: CHEST IMPRESSION: 1. 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 2. No pulmonary infection 3. Stable skeletal metastasis. PELVIS IMPRESSION: 1. Small gas collection in the gallbladder fossa following cholecystectomy. Potential abscess formation. 2. Expansion of LEFT acetabular metastasis into the operator fossa. 3.  Aortic Atherosclerosis (ICD10-I70.0). Electronically Signed   By: Suzy Bouchard M.D.   On: 03/30/2022 12:01   DG Chest Portable 1 View  Result Date: 03/30/2022 CLINICAL DATA:  Shortness of breath EXAM: PORTABLE CHEST 1 VIEW COMPARISON:  01/08/2022 FINDINGS: Right chest port with catheter tip at the superior cavoatrial junction. Somewhat low lung volumes. Cardiac and mediastinal contours are within normal limits. No focal pulmonary opacity. No pleural effusion or pneumothorax. No acute osseous abnormality. IMPRESSION: No acute cardiopulmonary process. Electronically Signed   By: Merilyn Baba M.D.   On: 03/30/2022 11:49    Medications: I have reviewed the patient's current medications.  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, L4 nerve compression Biopsy of the L4 mass 11/18/2015 confirmed metastatic renal cell carcinoma, clear cell type CTs of the chest, abdomen, and pelvis 11/18/2015-right lower lobe nodule, expansile lytic lesion at the right 11th rib/costal vertebral junction, left renal mass, expansile lesion involving the L4 vertebra, lytic lesion at the left acetabulum, and a low-attenuation liver lesion Initiation of SRS to L4 12/02/2015, Completed  12/12/2015 Initiation of Pazopanib 12/30/2015 Pazopanib placed on hold 02/06/2016 secondary to elevated liver enzymes Pazopanib resumed 03/07/2016 at a dose of 400 mg daily  Pazopanib discontinued 03/19/2016 secondary to elevated liver enzymes Restaging CTs 04/02/2016-stable left renal mass, decreased soft tissue component associated with the L4 metastasis, increased soft tissue component associated with the right 11th rib metastasis with increased T11 bony destruction, increased sclerosis at the left acetabulum lesion Cycle 1 nivolumab 04/12/2016 Cycle 2 nivolumab 04/26/2016 Cycle 3 nivolumab 05/11/2016 Cycle 4 nivolumab 05/24/2016 Cycle 5 nivolumab 06/08/2016 MRI lumbar spine 06/21/2016-unchanged tumor at L3, increased size of retroperitoneal lymph nodes compared to a CT from 04/02/2016 Cycle 6 nivolumab  06/22/2016 CTs chest, abdomen, and pelvis 07/04/2016-enlargement of the left renal mass, right adrenal nodule, left hilar and peritoneal lymph nodes, enlargement of left acetabular lesion. Stable lung nodules. Cycle 7 nivolumab 07/06/2016 Cycle 8 nivolumab 07/20/2016 Cycle 9 nivolumab 08/02/2016 Cycle 10 nivolumab 08/20/2016 Restaging CT 09/03/2016 evaluation with stable disease Cycle 11 nivolumab 09/05/2016 Cycle 12 nivolumab 09/19/2016 Cycle 13 nivolumab 10/03/2016 Cycle 14 nivolumab 10/17/2016 Cycle 15 nivolumab 10/31/2016 Cycle 16 nivolumab 11/14/2016 (changed to monthly schedule) Cycle 17 nivolumab 12/19/2016 CTs 01/21/2017-increased left renal mass, increased size of adrenal metastases, increased lytic bone lesions, increased left lung nodule, persistent tumor at L4 with probable epidural component Initiation of Cabozantinib 01/28/2017 Restaging CTs 05/30/2017- decreased size of left hilar mass, left renal mass, retroperitoneal adenopathy, and adrenal metastasis.  Healing bone lesions. Cabozantinib continued CTs 10/21/2017- interval enlargement left hilar lymph node; stable rib  lesions; stable mass left renal cortex; stable mildly nodular adrenal glands; stable lytic lesions within the pelvis and spine. Cabozantinib continued CTs 02/21/2018- enlargement of an AP window lymph node.  Mildly enlarged left hilar lymph node is unchanged.  Primary renal cell carcinoma involving the upper pole of the left kidney appears similar.  Stable enlarged right periaortic lymph node adjacent to the renal vessels.  Stable multifocal bony metastatic disease. Cabozantinib continued CTs 07/29/2018- stable 15 mm AP window nodes; stable left hilar node; subcarinal node slightly larger; right periaortic node 16 mm, previously 12 mm; portacaval node 24 mm, previously 16 mm; 15 mm node superior to the pancreatic head has enlarged; interval increase nodularity of both adrenal glands; stable left kidney mass; multifocal bony metastatic disease not significantly changed. Cabozantinib continued CTs 11/06/2018- moderate improvement in thoracic adenopathy; minimal improvement abdominal adenopathy; left kidney upper pole mass and various lytic expansile bone lesions stable; mild increase in nodularity of left adrenal gland; right adrenal gland nodularity stable. 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  Peripheral enhancing masses consistent with metastases. No hemorrhage. MRI brain 03/11/2019-7 enhancing brain masses consistent with metastatic disease SRS to 7 brain lesions, treatment given 03/19/2019, 03/23/2019, and 03/25/2019 CTs 04/09/2019-decrease in left renal mass, bilateral adrenal nodules, AP window and porta hepatic adenopathy.  New 9 mm right lower lobe nodule.  Improved lytic lesion of the left acetabulum.  Other bone lesions are stable. Cabozantinib continued MRI brain 07/17/2019-resolution of 4 mm treated lesion in the right frontal cortex, 6 remaining treated lesions have decreased in size,  new punctate metastasis in the superior right cerebellum SRS to right cerebellar lesion 07/30/2019 CTs 08/17/2019-previously noted right lower lobe nodule resolved, stable left kidney mass, mixed lytic/sclerotic bone lesions in the thoracolumbar spine, right posterior ribs, and left acetabulum-unchanged, no evidence of progressive disease  Cabozantinib continued MRI brain 10/23/2019-stable to slight decrease in size of multiple enhancing intracranial lesions.  Slight increase in surrounding edema in the medial left frontal lobe.  No new lesions present. MRI brain 01/29/2020-multiple enhancing brain lesions, some hemorrhagic, some with mild enlargement-treatment effect? CTs 02/08/2020-no thoracic metastases, enlargement of the left renal mass, enlargement of lytic lesion at T9, other lytic lesions unchanged MRI brain 05/04/2020-no new lesions, slight enlargement of a right occipital lesion, other lesions are stable or decreased in size CTs 08/11/2020-enlargement of left kidney mass, new 1.6 cm segment 7 liver lesion, enlargement of a lytic lesion at T9, increased lysis of a sclerotic lesion at the right second rib, 0.7 cm endoluminal nodule at the bladder dome-enlarged Brain MRI 08/10/2020-slight increase in a 15 mm left frontal lobe lesion, new punctate focus in the right occipital cortex, other lesions stable or slightly decreased CTs 11/01/2020-increase in left suprahilar opacity, enlargement of previous liver metastases, several new subcentimeter lesions, increase in left renal mass, similar appearance of bone metastases, stable hyperdense/enhancing nodule in the left bladder Brain MRI 11/04/2020-no change in brain metastases Lenvatinib/pembrolizumab 11/30/2020 CTs 03/03/2021-stable left hilar lymph nodes; resolution of left upper lobe perihilar nodularity; unchanged for millimeter peripheral right upper lobe nodule; multiple lytic bone lesions appear stable; left kidney lesion decreased in size; bilateral adrenal  nodules decreased in size; several liver lesion showed mild increase in size; several new small lesions within the right hepatic lobe. Lenvatinib/pembrolizumab continued 03/07/2021 Brain MRI 03/10/2021-progression of a left frontal metastasis, tiny new metastasis in the right frontal lobe-referred for SBRT to the right frontal lesion and to [facility] to consider a LITT procedure for the progressive left frontal lesion SRS 04/06/2021, LITT procedure at [facility] 04/18/2021-pathology metastatic renal cell carcinoma in background of necrosis/gliosis  Lenvatinib on hold Pembrolizumab held 04/24/2021 Pembrolizumab 05/01/2021, lenvatinib remains on hold pending surgical evaluation Pembrolizumab 05/25/2021, lenvatinib resumed Pembrolizumab 06/15/2021 Lenvima resumed 06/21/2021 CTs 07/28/2021-continued regression left renal lesion.  Much improved appearance of the liver.  Continued regression of bilateral adrenal gland nodules.  Stable mixed lytic and sclerotic metastatic bone disease.  No new or progressive findings. Pembrolizumab 07/31/2021, lenvatinib continued Pembrolizumab held 08/21/2021 due to generalized weakness Pembrolizumab resumed 09/11/2021 MRI brain 10/19/2021-"new "focus of minimal enhancement of the right frontal lobe, larger punctate right cerebellar lesion, review of MRI found the right frontal lesion was present in 2021 and was treated with SRS CTs 11/12/2021-2 new right upper lung nodules, stable left renal lesion, enlarging lytic lesion in the left fourth rib Pembrolizumab and lenvatinib continued Brain 01/02/2022-new 4 mm lesion in the right parietal cortex the largest lesion in left frontal lobe stable in size with more solid  CTs chest, abdomen, and pelvis 01/08/2022-increased soft tissue component of a lytic left acetabular  metastasis and increased sclerotic component of the left fourth rib metastasis, decreased conspicuity of right upper lobe nodules, no change in renal mass Pembrolizumab and  lenvatinib continued MRI brain 03/27/2022-minimal increase size of a 3 mm right cerebellar lesion, increased size of enhancing mass at the left frontal lobe measuring 3 x 2.2 cm with adjacent gyriform enhancement and mild worsening of edema in the left frontal white matter CTs 03/30/2022-new pulmonary nodules, small gas collection in the gallbladder fossa, increased left acetabular metastasis   Pain secondary to #1-managed by Dr. Lovenia Shuck.  Improved Hypertension Elevated transaminases 02/06/2016- Pazopanib placed on hold Liver enzymes normal 03/07/2016 Port-A-Cath placement 05/16/2016 Malaise/anorexia 09/05/2016. Cortisol and testosterone levels low. Hydrocortisone and testosterone replacement initiated. Conjunctival/scleral erythema 09/19/2016-resolved with steroid eyedrops Proximal right leg weakness. 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 Hypercalcemia status post Zometa 01/23/2017-resolved Hypercalcemia 03/29/2022-Zometa Pruritic rash following IV contrast 10/21/2017; rash following IV contrast 02/21/2018 despite prednisone/Benadryl premedication Brief episodes of expressive aphasia October and December 2020 Vitamin B12 deficiency confirmed on lab 03/07/2021-started oral vitamin B12 replacement 03/28/2021 Admission 01/08/2022 with acute cholecystitis/sepsis, cholecystostomy tube Cholecystectomy 03/15/2022 14.  Admission 03/30/2022 with failure to thrive and fever   Phillip [name] has metastatic renal cell carcinoma.  A brain MRI this week confirmed increase in the size of a dominant left frontal lobe metastasis with mild worsening of edema.  He had hypercalcemia yesterday.  Lenvatinib and pembrolizumab were discontinued and he was treated with IV fluids and Zometa.  He presents today with a fever and continued failure to thrive.  He may have an infection.  CT scan was revealed no evidence of pneumonia.  There is a small fluid collection at the gallbladder fossa.  There  is no clinical evidence of a Port-A-Cath infection.  Hypercalcemia is improved following Zometa and IV fluids yesterday.  The elevated white count is secondary to Decadron versus infection.  I discussed the current situation with his wife.  She understands the overall poor prognosis.  He has been placed on a no CODE BLUE status.  A referral has been made to home palliative care.  We will consider a full hospice referral depending on his recovery over the next few days.  Recommendations: Continue broad-spectrum intravenous antibiotics, follow-up cultures Continue Decadron I will check on him 03/31/2022 or 04/01/2022, please call oncology as needed Plan for home with palliative care versus hospice depending on his recovery over the next few days     LOS: 0 days  Betsy Coder, MD   03/30/2022, 4:37 PM

## 2022-03-30 NOTE — ED Provider Notes (Signed)
[place] EMERGENCY DEPARTMENT AT [facility] Provider Note   CSN: [id number] Arrival date & time: 03/30/22  9924     History No chief complaint on file.   Phillip [name] is a 77 y.o. 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  She reports that ever since his cholecystectomy on 1/18 the patient has been progressively declining.  She says that he seems more confused, weaker and less alert.  Today she says that he was extremely weak, stood up but would not walk or respond to her.  He was prescribed Decadron yesterday by oncologist and that is the only medication he has had today.  He is currently on immunotherapy for his stage IV renal cell carcinoma.  HPI     Home Medications Prior to Admission medications   Medication Sig Start Date End Date Taking? Authorizing Provider  Cholecalciferol (VITAMIN D) 50 MCG (2000 UT) CAPS Take 2,000 Units by mouth daily.    [provider]  Cyanocobalamin (VITAMIN B12) 1000 MCG TBCR Take 1,000 mcg by mouth daily.    [provider]  dexamethasone (DECADRON) 4 MG tablet Take 1 tablet (4 mg total) by mouth daily. Take for 14 days 03/29/22   [name], MD  docusate sodium (COLACE) 100 MG capsule Take 200 mg by mouth 2 (two) times daily.    [provider]  dorzolamide (TRUSOPT) 2 % ophthalmic solution Instill 1 drop into right eye three times daily. 10/27/21     levETIRAcetam (KEPPRA) 500 MG tablet Take 1 tablet (500 mg total) by mouth 2 (two) times daily. Patient taking differently: Take 250 mg by mouth 2 (two) times daily. 02/28/22   [name], MD  lidocaine-prilocaine (EMLA) cream APPLY TO PORTACATH 1 HOUR PRIOR TO USE AS NEEDED 11/13/21   [name], MD  losartan-hydrochlorothiazide (HYZAAR) 100-12.5 MG tablet Take 1 tablet by mouth once a day. Patient not taking: Reported on 03/29/2022 12/29/21     mirtazapine  (REMERON) 30 MG tablet Take 1 tablet (30 mg total) by mouth at bedtime. 12/29/21   [name], MD  ondansetron (ZOFRAN) 8 MG tablet Take 1 tablet (8 mg total) by mouth every 8 (eight) hours as needed for nausea for vomiting. 01/29/22   [name], MD  oxyCODONE (OXY IR/ROXICODONE) 5 MG immediate release tablet Take 1 tablet (5 mg total) by mouth every 6 (six) hours as needed for severe pain. 03/28/22   Owens Shark, NP  oxyCODONE (OXYCONTIN) 10 mg 12 hr tablet Take 2 tablets (20 mg total) by mouth every 12 (twelve) hours. Patient taking differently: Take 10-20 mg by mouth See admin instructions. Take 10-20 mg in the morning and 20 mg at night 02/28/22   Owens Shark, NP  polyethylene glycol (MIRALAX / GLYCOLAX) 17 g packet Take 17 g by mouth daily as needed for moderate constipation.    [provider]  prochlorperazine (COMPAZINE) 10 MG tablet Take 1 tablet by mouth every 6 hours as needed for nausea or vomiting. Patient not taking: Reported on 02/15/2022 01/04/21   Owens Shark, NP  saccharomyces boulardii (FLORASTOR) 250 MG capsule Take 250 mg by mouth 2 (two) times daily.    [provider]  Testosterone 1.62 % GEL Apply 2 Pumps topically daily. 03/28/22   Owens Shark, NP      Allergies    Contrast media [iodinated  contrast media]    Review of Systems   Review of Systems  Physical Exam Updated Vital Signs There were no vitals taken for this visit. Physical Exam Vitals and nursing note reviewed.  Constitutional:      General: He is not in acute distress.    Appearance: Normal appearance. He is not ill-appearing.  HENT:     Head: Normocephalic and atraumatic.     Mouth/Throat:     Mouth: Mucous membranes are dry.     Pharynx: Oropharynx is clear.  Eyes:     General: No scleral icterus.    Conjunctiva/sclera: Conjunctivae normal.  Cardiovascular:     Rate and Rhythm: Regular rhythm. Tachycardia present.  Pulmonary:     Effort: Pulmonary effort is  normal. No respiratory distress.  Abdominal:     General: Abdomen is flat.     Palpations: Abdomen is soft.     Tenderness: There is abdominal tenderness (Right upper and right lower quadrant tenderness).  Skin:    General: Skin is warm and dry.     Coloration: Skin is pale. Skin is not jaundiced.     Findings: No rash.  Neurological:     Mental Status: He is alert.     Comments: Normal strength in bilateral upper extremities  Psychiatric:        Mood and Affect: Mood normal.     ED Results / Procedures / Treatments   Labs (all labs ordered are listed, but only abnormal results are displayed) Labs Reviewed - No data to display  EKG None  Radiology CT CHEST ABDOMEN PELVIS WO CONTRAST  Result Date: 03/30/2022 CLINICAL DATA:  Sepsis. Renal cell carcinoma. Lumbar metastasis. * Tracking Code: BO * EXAM: CT CHEST, ABDOMEN AND PELVIS WITHOUT CONTRAST TECHNIQUE: Multidetector CT imaging of the chest, abdomen and pelvis was performed following the standard protocol without IV contrast. RADIATION DOSE REDUCTION: This exam was performed according to the departmental dose-optimization program which includes automated exposure control, adjustment of the mA and/or kV according to patient size and/or use of iterative reconstruction technique. COMPARISON:  None Available. FINDINGS: CT CHEST FINDINGS Cardiovascular: Coronary artery calcification and aortic atherosclerotic calcification. Port in the anterior chest wall with tip in distal SVC. Mediastinum/Nodes: No axillary or supraclavicular adenopathy. No mediastinal or hilar adenopathy. No pericardial fluid. Esophagus normal. Lungs/Pleura: New nodule in the medial RIGHT lung apex measuring 8 mm (49/8) new nodule in the inferior LEFT lower lobe measuring 5 mm (is 112/8) New nodule along the LEFT oblique fissure medially measures 5 mm (60/8) Musculoskeletal: Again demonstrated expansile lytic lesion in the medial RIGHT second rib (image 35/8. Similar  pattern in the medial eleventh rib. Metastatic pattern within the first vertebral body additionally. No new lesions are present. CT ABDOMEN AND PELVIS FINDINGS Hepatobiliary: No focal lesion on noncontrast exam. Postcholecystectomy. There is small a collection the gallbladder fossa (image 63/2). This collection measures 21 mm x 18 mm. Pancreas: Pancreas is normal. Spleen: Normal spleen Adrenals/urinary tract: Adrenal glands and kidneys are normal. The ureters and bladder normal. Stomach/Bowel: Stomach, small bowel, appendix, and cecum are normal. The colon and rectosigmoid colon are normal. Vascular/Lymphatic: Abdominal aorta is normal caliber. There is no retroperitoneal or periportal lymphadenopathy. No pelvic lymphadenopathy. Reproductive: Other: No free fluid. Musculoskeletal: The metastatic lesion in the LEFT acetabulum appears increased in volume measuring 4.8 x 3.3 cm compared to 3.42.5 cm. This lesion is lytic with soft tissue expansion in the operator space. Stable L4 lumbar metastasis. IMPRESSION: CHEST IMPRESSION: 1.  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 2. No pulmonary infection 3. Stable skeletal metastasis. PELVIS IMPRESSION: 1. Small gas collection in the gallbladder fossa following cholecystectomy. Potential abscess formation. 2. Expansion of LEFT acetabular metastasis into the operator fossa. 3.  Aortic Atherosclerosis (ICD10-I70.0). Electronically Signed   By: Suzy Bouchard M.D.   On: 03/30/2022 12:01   DG Chest Portable 1 View  Result Date: 03/30/2022 CLINICAL DATA:  Shortness of breath EXAM: PORTABLE CHEST 1 VIEW COMPARISON:  01/08/2022 FINDINGS: Right chest port with catheter tip at the superior cavoatrial junction. Somewhat low lung volumes. Cardiac and mediastinal contours are within normal limits. No focal pulmonary opacity. No pleural effusion or pneumothorax. No acute osseous abnormality. IMPRESSION: No acute cardiopulmonary process. Electronically Signed    By: Merilyn Baba M.D.   On: 03/30/2022 11:49    Procedures .Critical Care  Performed by: Rhae Hammock, PA-C Authorized by: Rhae Hammock, PA-C   Critical care provider statement:    Critical care time (minutes):  30   Critical care time was exclusive of:  Separately billable procedures and treating other patients   Critical care was necessary to treat or prevent imminent or life-threatening deterioration of the following conditions:  Sepsis   Critical care was time spent personally by me on the following activities:  Development of treatment plan with patient or surrogate, discussions with consultants, evaluation of patient's response to treatment, examination of patient, ordering and review of laboratory studies, ordering and review of radiographic studies, ordering and performing treatments and interventions, pulse oximetry, re-evaluation of patient's condition and review of old charts   I assumed direction of critical care for this patient from another provider in my specialty: no     Care discussed with: admitting provider      Medications Ordered in ED Medications - No data to display  ED Course/ Medical Decision Making/ A&P                             Medical Decision Making Amount and/or Complexity of Data Reviewed Labs: ordered. Radiology: ordered. ECG/medicine tests: ordered.  Risk OTC drugs. Prescription drug management.   77 year old male presenting today septic.  Differential includes but is not limited to sepsis secondary to intra-abdominal source, pulmonary source, worsening malignancy, COVID/flu/RSV.  This is not an exhaustive differential.    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   Additional history:  Last visit with oncology yesterday.  He is being followed for hypercalcemia, calcium yesterday was 11.8.  Plan was for repeat labs on February  5  Cholecystectomy 6/86/1683 without complications  On immunotherapy treatment for renal cell carcinoma   Physical Exam: Pertinent physical exam findings include Well-healed surgical scar from cholecystectomy Right-sided abdominal tenderness Satting between 89 and 91 on room air.  Placed on 2 L of nasal cannula O2  Lab Tests: I ordered, and personally interpreted labs.  The pertinent results include: LFTs elevated but below baseline Leukocytosis 13.8, was 16.4 yesterday so this is improving   Imaging Studies: Chest x-ray and CT chest, abdomen and pelvis.  Chest x-ray within normal limits.  CT scan reveals increased pulmonary metastases and potentially developing fluid pocket around patient's cholecystectomy site.   Cardiac Monitoring:  The patient was maintained on a cardiac monitor.  I viewed and interpreted the cardiac monitored which showed an underlying rhythm of:  Sinus tachycardia   Medications: Patient presented meeting sepsis criteria.  Started on broad-spectrum antibiotics.   Consultations Obtained: I spoke with Legrand Como with general surgery who says that they will come and see the patient about his potentially developing abscess   MDM/Disposition: This is a unfortunate 77 year old male who presented today septic.  Complaining of weakness, subjective fevers and overall decline since his cholecystectomy in mid January.  Broad-spectrum antibiotics were ordered when he first arrived as we did not have a source of his infection.  Does not appear to have a pulmonary source of the infection however potentially developing an abscess at his cholecystectomy site.  Surgery will evaluate the patient however this is a potential source.  Of note, he has a leukocytosis but it is decreased from yesterday.  Regardless, patient will require admission to the hospital for further workup.   Final Clinical Impression(s) / ED Diagnoses Final diagnoses:  Sepsis, due to unspecified organism,  unspecified whether acute organ dysfunction present [facility])    Rx / DC Orders ED Discharge Orders     None      Admit to Dr. Marylyn Ishihara   I discussed this case with my attending physician Dr. Tyrone Nine who cosigned this note including patient's presenting symptoms, physical exam, and planned diagnostics and interventions. Attending physician stated agreement with plan or made changes to plan which were implemented.      Rhae Hammock, PA-C 03/30/22 1312    Deno Etienne, DO 03/30/22 1342

## 2022-03-30 NOTE — Progress Notes (Signed)
Pharmacy Antibiotic Note  Phillip [name] is a 77 y.o. male admitted on 03/30/2022 with sepsis.  PMH significant for metastatic renal cell carcinoma, HTN, seizure. S/p cholecystectomy on 03/15/22.   Pharmacy has been consulted for vancomycin and cefepime dosing.  Today, 03/30/22 WBC slightly elevated SCr WNL, rounded to 1 for dose calculations Tmax 101.9 F  Plan: Cefepime 2 g IV q8h + metronidazole 500 mg IV q12h Vancomycin 2000 mg loading dose followed by 1500 mg IV q24h for estimated AUC of 497. Goal vancomycin AUC 400-550. Check levels at steady state as needed.  Monitor renal function, culture data.   Height: '5\' 11"'$  (180.3 cm) Weight: 102.1 kg (225 lb) IBW/kg (Calculated) : 75.3  Temp (24hrs), Avg:99.7 F (37.6 C), Min:97.4 F (36.3 C), Max:101.9 F (38.8 C)  Recent Labs  Lab 03/29/22 1031 03/30/22 1138  WBC 16.4* 13.8*  CREATININE 0.56* 0.71  LATICACIDVEN  --  1.7    Estimated Creatinine Clearance: 94.1 mL/min (by C-G formula based on SCr of 0.71 mg/dL).    Allergies  Allergen Reactions   Contrast Media [Iodinated Contrast Media] Rash   Antimicrobials this admission: cefepime 2/2 >>  vancomycin 2/2 >> Metronidazole 2/2 >>  Dose adjustments this admission:  Microbiology results: 2/2 BCx:   Lenis Noon, PharmD 03/30/2022 2:38 PM

## 2022-03-30 NOTE — ED Notes (Signed)
Patient with nuclear medicine.

## 2022-03-30 NOTE — Progress Notes (Addendum)
Subjective: CC: Patient known to our service. Prior perc chole for acute cholecystitis in nov 2023. Recently underwent lap chole by Dr. Ninfa Linden on 03/15/22. D/c'd POD1. Reported to have weakness and AMS at home. Wife at bedside reports he has been having some RUQ pain. Found to be febrile in the ED to 101.9, tachy to 100 that improved with IVF. No hypotension. WBC 13.8 (down from 16.4 yesterday). Lipase pending. LFT's mildly elevated with Alk phos 172 (188 yesterday), AST 47, ALT 86 (107 yesterday). T. Bili wnl. CT A/P with small gas collection in the gallbladder fossa (21 mm x 18 mm). Dr. Ninfa Linden did put snow in liver bed during time of surgery.   Patient has hx of metastatic RCC w/ mets to brain and bone followed by Dr. [name]. He had a recent MRI brain that showed increase size of a 3cm left frontal lobe mass. CT scan today also showed 3 new pulm nodules.   Objective: Vital signs in last 24 hours: Temp:  [97.4 F (36.3 C)-101.9 F (38.8 C)] 97.4 F (36.3 C) (02/02 1302) Pulse Rate:  [77-94] 83 (02/02 1225) Resp:  [17-31] 22 (02/02 1225) BP: (113-154)/(67-76) 145/70 (02/02 1225) SpO2:  [91 %-100 %] 100 % (02/02 1225) Weight:  [102.1 kg] 102.1 kg (02/02 1029)    Intake/Output from previous day: No intake/output data recorded. Intake/Output this shift: No intake/output data recorded.  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 Patient rolled and evaluated by my attending. No evidence of active infection/disease.   Lab Results:  Recent Labs    03/29/22 1031 03/30/22 1138  WBC 16.4* 13.8*  HGB 9.6* 9.6*  HCT 31.8* 33.7*  PLT 623* 530*   BMET Recent Labs    03/29/22 1031 03/30/22 1138  NA 137 137  K 4.4 4.2  CL 100 99  CO2 29 27  GLUCOSE 170* 115*  BUN 16 12   CREATININE 0.56* 0.71  CALCIUM 11.8* 10.6*   PT/INR Recent Labs    03/30/22 1138  LABPROT 15.7*  INR 1.3*   CMP     Component Value Date/Time   NA 137 03/30/2022 1138   NA 140 02/21/2017 1128   K 4.2 03/30/2022 1138   K 4.3 02/21/2017 1128   CL 99 03/30/2022 1138   CO2 27 03/30/2022 1138   CO2 26 02/21/2017 1128   GLUCOSE 115 (H) 03/30/2022 1138   GLUCOSE 115 02/21/2017 1128   BUN 12 03/30/2022 1138   BUN 16.6 02/21/2017 1128   CREATININE 0.71 03/30/2022 1138   CREATININE 0.56 (L) 03/29/2022 1031   CREATININE 0.8 02/21/2017 1128   CALCIUM 10.6 (H) 03/30/2022 1138   CALCIUM 9.4 02/21/2017 1128   PROT 6.9 03/30/2022 1138   PROT 7.5 02/21/2017 1128   ALBUMIN 2.6 (L) 03/30/2022 1138   ALBUMIN 4.2 02/21/2017 1128   AST 47 (H) 03/30/2022 1138   AST 38 03/29/2022 1031   AST 20 02/21/2017 1128   ALT 86 (H) 03/30/2022 1138   ALT 107 (H) 03/29/2022 1031   ALT 43 02/21/2017 1128   ALKPHOS 172 (H) 03/30/2022 1138   ALKPHOS 104 02/21/2017 1128   BILITOT 0.7 03/30/2022 1138   BILITOT 0.6 03/29/2022 1031   BILITOT 0.36 02/21/2017 1128   GFRNONAA >60 03/30/2022 1138   GFRNONAA >60  03/29/2022 1031   GFRAA >60 11/05/2019 0957   Lipase     Component Value Date/Time   LIPASE 22 01/08/2022 1210    Studies/Results: CT CHEST ABDOMEN PELVIS WO CONTRAST  Result Date: 03/30/2022 CLINICAL DATA:  Sepsis. Renal cell carcinoma. Lumbar metastasis. * Tracking Code: BO * EXAM: CT CHEST, ABDOMEN AND PELVIS WITHOUT CONTRAST TECHNIQUE: Multidetector CT imaging of the chest, abdomen and pelvis was performed following the standard protocol without IV contrast. RADIATION DOSE REDUCTION: This exam was performed according to the departmental dose-optimization program which includes automated exposure control, adjustment of the mA and/or kV according to patient size and/or use of iterative reconstruction technique. COMPARISON:  None Available. FINDINGS: CT CHEST FINDINGS Cardiovascular: Coronary  artery calcification and aortic atherosclerotic calcification. Port in the anterior chest wall with tip in distal SVC. Mediastinum/Nodes: No axillary or supraclavicular adenopathy. No mediastinal or hilar adenopathy. No pericardial fluid. Esophagus normal. Lungs/Pleura: New nodule in the medial RIGHT lung apex measuring 8 mm (49/8) new nodule in the inferior LEFT lower lobe measuring 5 mm (is 112/8) New nodule along the LEFT oblique fissure medially measures 5 mm (60/8) Musculoskeletal: Again demonstrated expansile lytic lesion in the medial RIGHT second rib (image 35/8. Similar pattern in the medial eleventh rib. Metastatic pattern within the first vertebral body additionally. No new lesions are present. CT ABDOMEN AND PELVIS FINDINGS Hepatobiliary: No focal lesion on noncontrast exam. Postcholecystectomy. There is small a collection the gallbladder fossa (image 63/2). This collection measures 21 mm x 18 mm. Pancreas: Pancreas is normal. Spleen: Normal spleen Adrenals/urinary tract: Adrenal glands and kidneys are normal. The ureters and bladder normal. Stomach/Bowel: Stomach, small bowel, appendix, and cecum are normal. The colon and rectosigmoid colon are normal. Vascular/Lymphatic: Abdominal aorta is normal caliber. There is no retroperitoneal or periportal lymphadenopathy. No pelvic lymphadenopathy. Reproductive: Other: No free fluid. Musculoskeletal: The metastatic lesion in the LEFT acetabulum appears increased in volume measuring 4.8 x 3.3 cm compared to 3.42.5 cm. This lesion is lytic with soft tissue expansion in the operator space. Stable L4 lumbar metastasis. IMPRESSION: CHEST IMPRESSION: 1. 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 2. No pulmonary infection 3. Stable skeletal metastasis. PELVIS IMPRESSION: 1. Small gas collection in the gallbladder fossa following cholecystectomy. Potential abscess formation. 2. Expansion of LEFT acetabular metastasis into the operator fossa. 3.   Aortic Atherosclerosis (ICD10-I70.0). Electronically Signed   By: Suzy Bouchard M.D.   On: 03/30/2022 12:01   DG Chest Portable 1 View  Result Date: 03/30/2022 CLINICAL DATA:  Shortness of breath EXAM: PORTABLE CHEST 1 VIEW COMPARISON:  01/08/2022 FINDINGS: Right chest port with catheter tip at the superior cavoatrial junction. Somewhat low lung volumes. Cardiac and mediastinal contours are within normal limits. No focal pulmonary opacity. No pleural effusion or pneumothorax. No acute osseous abnormality. IMPRESSION: No acute cardiopulmonary process. Electronically Signed   By: Merilyn Baba M.D.   On: 03/30/2022 11:49    Anti-infectives: Anti-infectives (From admission, onward)    Start     Dose/Rate Route Frequency Ordered Stop   03/30/22 1045  vancomycin (VANCOREADY) IVPB 2000 mg/400 mL        2,000 mg 200 mL/hr over 120 Minutes Intravenous  Once 03/30/22 1038     03/30/22 1030  ceFEPIme (MAXIPIME) 2 g in sodium chloride 0.9 % 100 mL IVPB        2 g 200 mL/hr over 30 Minutes Intravenous  Once 03/30/22 1029 03/30/22 1250   03/30/22 1030  metroNIDAZOLE (FLAGYL) IVPB 500  mg        500 mg 100 mL/hr over 60 Minutes Intravenous  Once 03/30/22 1029     03/30/22 1030  vancomycin (VANCOCIN) IVPB 1000 mg/200 mL premix  Status:  Discontinued        1,000 mg 200 mL/hr over 60 Minutes Intravenous  Once 03/30/22 1029 03/30/22 1038        Assessment/Plan S/p laparoscopic cholecystectomy by Dr. Ninfa Linden on 03/15/22 Fluid collection in GB fossa w/ mild elevated LFT's Fever CT reviewed. There is a small gas collection in the gallbladder fossa (21 mm x 18 mm). Dr. Ninfa Linden did put snow in liver bed during time of surgery. I think this fluid collection is likely to small for IR drainage. Will start with HIDA scan to r/o bile leak. Agree with broad spectrum abx and TRH admission. Keep NPO. We will follow with you.    LOS: 0 days    Jillyn Ledger , [facility] Surgery 03/30/2022, 1:22  PM Please see Amion for pager number during day hours 7:00am-4:30pm

## 2022-03-30 NOTE — H&P (Signed)
History and Physical    Patient: Phillip [name] [id number]:[id number] DOB: 1946-01-06 DOA: 03/30/2022 DOS: the patient was seen and examined on 03/30/2022 PCP: Charlane Ferretti, MD  Patient coming from: Home  Chief Complaint:  Chief Complaint  Patient presents with   Weakness   HPI: Phillip [name] is a 77 y.o. male with medical history significant of metastatic renal cell carcinoma, HTN, seizure d/o. Presenting with weakness. History is from his wife by phone. She reports that he's had a couple weeks of weakness since having his gallbladder removed. He was working with PT at home, but didn't seem to be improving. He went to his onco appointment yesterday and was weak there. He was given some fluids and steroids. She reports that she took him home without incident. This morning; however, he was unable to walk. He was very lethargic. Since his symptoms did not improve, she decided to have EMS bring him to the ED for evaluation. She denies any other aggravating or alleviating factors.   Review of Systems: As mentioned in the history of present illness. All other systems reviewed and are negative. 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[place])    Chronic kidney disease    renal cancer   Constipation    Depression    Hypertension    Low testosterone in male    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   Seizures ([place])    last seizure 01/2019 - controlled since on keppra   Tachycardia    Wears glasses    Past Surgical History:  Procedure Laterality Date   CHOLECYSTECTOMY N/A 03/15/2022   Procedure: LAPAROSCOPIC CHOLECYSTECTOMY;  Surgeon: Coralie Keens, MD;  Location: [employer];  Service: General;  Laterality: N/A;   insertion port-a-cath  2018   IR GENERIC HISTORICAL  11/18/2015   IR FLUORO GUIDED NEEDLE [place] ASPIRATION/INJECTION LOC 11/18/2015 [name], MD [facility]INTER[facility]   IR GENERIC HISTORICAL  05/16/2016   IR FLUORO GUIDE PORT  INSERTION RIGHT 05/16/2016 [name], MD [facility]INTER[facility] RAD   IR GENERIC HISTORICAL  05/16/2016   IR US GUIDE VASC ACCESS RIGHT 05/16/2016 [name], MD [facility]   IR PERC CHOLECYSTOSTOMY  01/09/2022   LITT procedure  2023   at [facility] 06/16/2019   Procedure: MRI BRAIN WITH AND WITHOUT CONTRAST WITH ANESTHESIA;  Surgeon: Radiologist, Medication, MD;  Location: [facility];  Service: Radiology;  Laterality: N/A;   Social History:  reports that he has never smoked. He has never used smokeless tobacco. He reports that he does not currently use alcohol. He reports current drug use. Drug: Marijuana.  Allergies  Allergen Reactions   Contrast Media [Iodinated Contrast Media] Rash    Family History  Problem Relation Age of Onset   Cancer Grandchild        lymphoma    Prior to Admission medications   Medication Sig Start Date End Date Taking? Authorizing Provider  Cholecalciferol (VITAMIN D) 50 MCG (2000 UT) CAPS Take 2,000 Units by mouth daily.   Yes [provider]  Cyanocobalamin (VITAMIN B12) 1000 MCG TBCR Take 1,000 mcg by mouth daily.   Yes [provider]  dexamethasone (DECADRON) 4 MG tablet Take 1 tablet (4 mg total) by mouth daily. Take for 14 days 03/29/22  Yes [name], MD  docusate sodium (COLACE) 100 MG capsule Take 200 mg by  mouth 2 (two) times daily.   Yes [provider]  dorzolamide (TRUSOPT) 2 % ophthalmic solution Instill 1 drop into right eye three times daily. 10/27/21  Yes   levETIRAcetam (KEPPRA) 500 MG tablet Take 1 tablet (500 mg total) by mouth 2 (two) times daily. Patient taking differently: Take 250 mg by mouth 2 (two) times daily. 02/28/22  Yes [name], MD  lidocaine-prilocaine (EMLA) cream APPLY TO PORTACATH 1 HOUR PRIOR TO USE AS NEEDED Patient taking differently: Apply 1 Application topically as needed (port). APPLY TO PORTACATH 1 HOUR PRIOR TO USE AS NEEDED 11/13/21  Yes [name], MD  losartan-hydrochlorothiazide (HYZAAR) 100-12.5 MG tablet Take 1 tablet by mouth once a day. 12/29/21  Yes   mirtazapine (REMERON) 30 MG tablet Take 1 tablet (30 mg total) by mouth at bedtime. 12/29/21  Yes [name], MD  ondansetron (ZOFRAN) 8 MG tablet Take 1 tablet (8 mg total) by mouth every 8 (eight) hours as needed for nausea for vomiting. 01/29/22  Yes [name], MD  oxyCODONE (OXY IR/ROXICODONE) 5 MG immediate release tablet Take 1 tablet (5 mg total) by mouth every 6 (six) hours as needed for severe pain. 03/28/22  Yes Owens Shark, NP  oxyCODONE (OXYCONTIN) 10 mg 12 hr tablet Take 2 tablets (20 mg total) by mouth every 12 (twelve) hours. Patient taking differently: Take 10-20 mg by mouth See admin instructions. Take 10-20 mg in the morning and 20 mg at night 02/28/22  Yes Owens Shark, NP  polyethylene glycol (MIRALAX / GLYCOLAX) 17 g packet Take 17 g by mouth daily as needed for moderate constipation.   Yes [provider]  prochlorperazine (COMPAZINE) 10 MG tablet Take 1 tablet by mouth every 6 hours as needed for nausea or vomiting. 01/04/21  Yes Owens Shark, NP  saccharomyces boulardii (FLORASTOR) 250 MG capsule Take 250 mg by mouth 2 (two) times daily.   Yes [provider]  Testosterone 1.62 % GEL Apply 2 Pumps topically daily. 03/28/22  Yes Owens Shark, NP    Physical Exam: Vitals:   03/30/22 1029 03/30/22 1045 03/30/22 1225 03/30/22 1302  BP:  (!) 154/72 (!) 145/70   Pulse:  90 83   Resp:  (!) 27 (!) 22   Temp:    (!) 97.4 F (36.3 C)  TempSrc:    Oral  SpO2:  100% 100%   Weight: 102.1 kg     Height: '5\' 11"'$  (1.803 m)      General: 77 y.o. male resting in bed in NAD Eyes: PERRL, normal sclera ENMT: Nares patent w/o discharge, orophaynx clear, dentition normal, ears w/o discharge/lesions/ulcers Neck: Supple, trachea midline Cardiovascular: RRR, +S1, S2, no m/g/r, equal pulses throughout Respiratory: decreased at bases, no r/r,  slight glassy insp wheeze, normal WOB on 2L [place] GI: BS+, NDNT, no masses noted, no organomegaly noted MSK: No e/c/c Neuro: A&O x 3, no focal deficits Psyc: Appropriate interaction but flat affect, calm/cooperative  Data Reviewed:  Results for orders placed or performed during the hospital encounter of 03/30/22 (from the past 24 hour(s))  Lactic acid, plasma     Status: None   Collection Time: 03/30/22 11:38 AM  Result Value Ref Range   Lactic Acid, Venous 1.7 0.5 - 1.9 mmol/L  Comprehensive metabolic panel     Status: Abnormal   Collection Time: 03/30/22 11:38 AM  Result Value Ref Range   Sodium 137 135 - 145 mmol/L   Potassium  4.2 3.5 - 5.1 mmol/L   Chloride 99 98 - 111 mmol/L   CO2 27 22 - 32 mmol/L   Glucose, Bld 115 (H) 70 - 99 mg/dL   BUN 12 8 - 23 mg/dL   Creatinine, Ser 0.71 0.61 - 1.24 mg/dL   Calcium 10.6 (H) 8.9 - 10.3 mg/dL   Total Protein 6.9 6.5 - 8.1 g/dL   Albumin 2.6 (L) 3.5 - 5.0 g/dL   AST 47 (H) 15 - 41 U/L   ALT 86 (H) 0 - 44 U/L   Alkaline Phosphatase 172 (H) 38 - 126 U/L   Total Bilirubin 0.7 0.3 - 1.2 mg/dL   GFR, Estimated >60 >60 mL/min   Anion gap 11 5 - 15  CBC with Differential     Status: Abnormal   Collection Time: 03/30/22 11:38 AM  Result Value Ref Range   WBC 13.8 (H) 4.0 - 10.5 K/uL   RBC 3.83 (L) 4.22 - 5.81 MIL/uL   Hemoglobin 9.6 (L) 13.0 - 17.0 g/dL   HCT 33.7 (L) 39.0 - 52.0 %   MCV 88.0 80.0 - 100.0 fL   MCH 25.1 (L) 26.0 - 34.0 pg   MCHC 28.5 (L) 30.0 - 36.0 g/dL   RDW 18.3 (H) 11.5 - 15.5 %   Platelets 530 (H) 150 - 400 K/uL   nRBC 0.0 0.0 - 0.2 %   Neutrophils Relative % 75 %   Neutro Abs 10.3 (H) 1.7 - 7.7 K/uL   Lymphocytes Relative 13 %   Lymphs Abs 1.8 0.7 - 4.0 K/uL   Monocytes Relative 7 %   Monocytes Absolute 0.9 0.1 - 1.0 K/uL   Eosinophils Relative 3 %   Eosinophils Absolute 0.5 0.0 - 0.5 K/uL   Basophils Relative 1 %   Basophils Absolute 0.1 0.0 - 0.1 K/uL   Immature Granulocytes 1 %   Abs Immature  Granulocytes 0.18 (H) 0.00 - 0.07 K/uL  Protime-INR     Status: Abnormal   Collection Time: 03/30/22 11:38 AM  Result Value Ref Range   Prothrombin Time 15.7 (H) 11.4 - 15.2 seconds   INR 1.3 (H) 0.8 - 1.2  APTT     Status: None   Collection Time: 03/30/22 11:38 AM  Result Value Ref Range   aPTT 35 24 - 36 seconds  Resp panel by RT-PCR (RSV, Flu A&B, Covid) Anterior Nasal Swab     Status: None   Collection Time: 03/30/22 11:43 AM   Specimen: Anterior Nasal Swab  Result Value Ref Range   SARS Coronavirus 2 by RT PCR NEGATIVE NEGATIVE   Influenza A by PCR NEGATIVE NEGATIVE   Influenza B by PCR NEGATIVE NEGATIVE   Resp Syncytial Virus by PCR NEGATIVE NEGATIVE  Urinalysis, Routine w reflex microscopic -Urine, Clean Catch     Status: Abnormal   Collection Time: 03/30/22 12:32 PM  Result Value Ref Range   Color, Urine YELLOW YELLOW   APPearance CLEAR CLEAR   Specific Gravity, Urine 1.015 1.005 - 1.030   pH 6.0 5.0 - 8.0   Glucose, UA NEGATIVE NEGATIVE mg/dL   Hgb urine dipstick NEGATIVE NEGATIVE   Bilirubin Urine NEGATIVE NEGATIVE   Ketones, ur NEGATIVE NEGATIVE mg/dL   Protein, ur 30 (A) NEGATIVE mg/dL   Nitrite NEGATIVE NEGATIVE   Leukocytes,Ua NEGATIVE NEGATIVE   RBC / HPF 0-5 0 - 5 RBC/hpf   WBC, UA 0-5 0 - 5 WBC/hpf   Bacteria, UA RARE (A) NONE SEEN   Squamous Epithelial /  HPF 0-5 0 - 5 /HPF   Mucus PRESENT    *Note: Due to a large number of results and/or encounters for the requested time period, some results have not been displayed. A complete set of results can be found in Results Review.   CXR: No acute cardiopulmonary process.   CT Chest/ab/pelvis CHEST IMPRESSION: 1. 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 2. No pulmonary infection 3. Stable skeletal metastasis. PELVIS IMPRESSION: 1. Small gas collection in the gallbladder fossa following cholecystectomy. Potential abscess formation. 2. Expansion of LEFT acetabular metastasis into the  operator fossa. 3.  Aortic Atherosclerosis (ICD10-I70.0).  Assessment and Plan: Sepsis secondary to biliary infection     - admit to inpt, progressive     - best source we have is abdominal; will continue broad spec abx for now     - follow Bld Cx     - general surgery onboard; appreciate assistance     - he's starting to get wheezy, so will hold fluids for right now; may need a touch of lasix, but will monitor for now     - keep NPO per general surgery rec  S4 RCC w/ liver, pulm and brain mets Seizure d/o     - he had a recent MRI brain w/ increasing brain met size; he was started on decadron by onco; will continue for now     - continue seizure PPx     - onco notified that the patient will be admitted; they will follow (spoke with Dr. Gearldine Shown staff)  Hypercalcemia     - given zometa in onco clinic yesterday, his numbers are improved; trend      HTN     - NPO for now; PRN hydralazine  Normocytic anemia     - at baseline, follow  Advance Care Planning:   Code Status: DNR; confirmed with wife by phone  Consults: General Surgery. Oncology  Family Communication: w/ wife by phone  Severity of Illness: The appropriate patient status for this patient is INPATIENT. Inpatient status is judged to be reasonable and necessary in order to provide the required intensity of service to ensure the patient's safety. The patient's presenting symptoms, physical exam findings, and initial radiographic and laboratory data in the context of their chronic comorbidities is felt to place them at high risk for further clinical deterioration. Furthermore, it is not anticipated that the patient will be medically stable for discharge from the hospital within 2 midnights of admission.   * I certify that at the point of admission it is my clinical judgment that the patient will require inpatient hospital care spanning beyond 2 midnights from the point of admission due to high intensity of service, high risk  for further deterioration and high frequency of surveillance required.*  Author: Jonnie Finner, DO 03/30/2022 1:51 PM  For on call review [email or web address].

## 2022-03-30 NOTE — ED Triage Notes (Signed)
Pt BIBA from home. EMS called for weakness.  Pt confused, unsure if baseline.   EMS accessed port. Gave 500 NS.  HR: 96 Temp: 100.4 temporal SPO2: 97 2L EtcoH- 25

## 2022-03-30 NOTE — Sepsis Progress Note (Signed)
Sepsis protocol monitored by eLink ?

## 2022-03-31 DIAGNOSIS — G9341 Metabolic encephalopathy: Secondary | ICD-10-CM | POA: Diagnosis not present

## 2022-03-31 DIAGNOSIS — C641 Malignant neoplasm of right kidney, except renal pelvis: Secondary | ICD-10-CM | POA: Diagnosis not present

## 2022-03-31 DIAGNOSIS — C649 Malignant neoplasm of unspecified kidney, except renal pelvis: Secondary | ICD-10-CM | POA: Diagnosis not present

## 2022-03-31 LAB — COMPREHENSIVE METABOLIC PANEL
ALT: 77 U/L — ABNORMAL HIGH (ref 0–44)
AST: 36 U/L (ref 15–41)
Albumin: 2.5 g/dL — ABNORMAL LOW (ref 3.5–5.0)
Alkaline Phosphatase: 182 U/L — ABNORMAL HIGH (ref 38–126)
Anion gap: 12 (ref 5–15)
BUN: 26 mg/dL — ABNORMAL HIGH (ref 8–23)
CO2: 19 mmol/L — ABNORMAL LOW (ref 22–32)
Calcium: 10.1 mg/dL (ref 8.9–10.3)
Chloride: 105 mmol/L (ref 98–111)
Creatinine, Ser: 1.27 mg/dL — ABNORMAL HIGH (ref 0.61–1.24)
GFR, Estimated: 58 mL/min — ABNORMAL LOW (ref >60)
Glucose, Bld: 222 mg/dL — ABNORMAL HIGH (ref 70–99)
Potassium: 4.5 mmol/L (ref 3.5–5.1)
Sodium: 136 mmol/L (ref 135–145)
Total Bilirubin: 0.6 mg/dL (ref 0.3–1.2)
Total Protein: 6.7 g/dL (ref 6.5–8.1)

## 2022-03-31 LAB — CBC
HCT: 34.1 % — ABNORMAL LOW (ref 39.0–52.0)
Hemoglobin: 10 g/dL — ABNORMAL LOW (ref 13.0–17.0)
MCH: 25.3 pg — ABNORMAL LOW (ref 26.0–34.0)
MCHC: 29.3 g/dL — ABNORMAL LOW (ref 30.0–36.0)
MCV: 86.3 fL (ref 80.0–100.0)
Platelets: 645 10*3/uL — ABNORMAL HIGH (ref 150–400)
RBC: 3.95 MIL/uL — ABNORMAL LOW (ref 4.22–5.81)
RDW: 18 % — ABNORMAL HIGH (ref 11.5–15.5)
WBC: 20.8 10*3/uL — ABNORMAL HIGH (ref 4.0–10.5)
nRBC: 0 % (ref 0.0–0.2)

## 2022-03-31 LAB — URINALYSIS, W/ REFLEX TO CULTURE (INFECTION SUSPECTED)
Bilirubin Urine: NEGATIVE
Glucose, UA: NEGATIVE mg/dL
Ketones, ur: NEGATIVE mg/dL
Leukocytes,Ua: NEGATIVE
Nitrite: NEGATIVE
Protein, ur: 100 mg/dL — AB
RBC / HPF: >50 RBC/hpf (ref 0–5)
Specific Gravity, Urine: 1.009 (ref 1.005–1.030)
pH: 6 (ref 5.0–8.0)

## 2022-03-31 LAB — PROCALCITONIN: Procalcitonin: 0.38 ng/mL

## 2022-03-31 LAB — PROTIME-INR
INR: 1.3 — ABNORMAL HIGH (ref 0.8–1.2)
Prothrombin Time: 15.8 seconds — ABNORMAL HIGH (ref 11.4–15.2)

## 2022-03-31 LAB — CORTISOL-AM, BLOOD: Cortisol - AM: 1.8 ug/dL — ABNORMAL LOW (ref 6.7–22.6)

## 2022-03-31 LAB — T4: T4, Total: 8.4 ug/dL (ref 4.5–12.0)

## 2022-03-31 MED ORDER — VANCOMYCIN HCL 1250 MG/250ML IV SOLN
1250.0000 mg | INTRAVENOUS | Status: DC
  Administered 2022-03-31 – 2022-04-01 (×2): 1250 mg via INTRAVENOUS
  Filled 2022-03-31 (×2): qty 250

## 2022-03-31 MED ORDER — OXYCODONE HCL ER 10 MG PO T12A
10.0000 mg | EXTENDED_RELEASE_TABLET | Freq: Every day | ORAL | Status: DC
  Administered 2022-04-01: 10 mg via ORAL
  Administered 2022-04-02: 20 mg via ORAL
  Administered 2022-04-03 – 2022-04-07 (×5): 10 mg via ORAL
  Administered 2022-04-08: 20 mg via ORAL
  Administered 2022-04-09 – 2022-04-12 (×4): 10 mg via ORAL
  Administered 2022-04-13 – 2022-04-15 (×3): 20 mg via ORAL
  Administered 2022-04-16 – 2022-04-17 (×2): 10 mg via ORAL
  Administered 2022-04-18 – 2022-04-19 (×2): 20 mg via ORAL
  Administered 2022-04-20 – 2022-04-21 (×2): 10 mg via ORAL
  Administered 2022-04-22: 20 mg via ORAL
  Administered 2022-04-23 – 2022-04-24 (×2): 10 mg via ORAL
  Administered 2022-04-25 – 2022-04-26 (×2): 20 mg via ORAL
  Administered 2022-04-27: 10 mg via ORAL
  Administered 2022-04-28 – 2022-04-29 (×2): 20 mg via ORAL
  Administered 2022-04-30: 10 mg via ORAL
  Filled 2022-03-31: qty 1
  Filled 2022-03-31: qty 2
  Filled 2022-03-31 (×5): qty 1
  Filled 2022-03-31: qty 2
  Filled 2022-03-31: qty 1
  Filled 2022-03-31 (×2): qty 2
  Filled 2022-03-31: qty 1
  Filled 2022-03-31: qty 2
  Filled 2022-03-31: qty 1
  Filled 2022-03-31 (×3): qty 2
  Filled 2022-03-31: qty 1
  Filled 2022-03-31: qty 2
  Filled 2022-03-31 (×3): qty 1
  Filled 2022-03-31 (×2): qty 2
  Filled 2022-03-31 (×2): qty 1
  Filled 2022-03-31 (×2): qty 2
  Filled 2022-03-31 (×4): qty 1

## 2022-03-31 MED ORDER — OXYCODONE HCL ER 20 MG PO T12A: 20.0000 mg | EXTENDED_RELEASE_TABLET | Freq: Every day | ORAL | Status: DC

## 2022-03-31 MED ORDER — OXYCODONE HCL 5 MG PO TABS
5.0000 mg | ORAL_TABLET | Freq: Four times a day (QID) | ORAL | Status: DC | PRN
  Administered 2022-04-09 – 2022-04-24 (×10): 5 mg via ORAL
  Filled 2022-03-31 (×10): qty 1

## 2022-03-31 MED ORDER — SODIUM CHLORIDE 0.9 % IV SOLN: INTRAVENOUS | Status: DC

## 2022-03-31 MED ORDER — OXYCODONE HCL ER 10 MG PO T12A: 10.0000 mg | EXTENDED_RELEASE_TABLET | ORAL | Status: DC

## 2022-03-31 MED ORDER — CHLORHEXIDINE GLUCONATE CLOTH 2 % EX PADS
6.0000 | MEDICATED_PAD | Freq: Every day | CUTANEOUS | Status: DC
  Administered 2022-03-31 – 2022-04-30 (×32): 6 via TOPICAL

## 2022-03-31 MED ORDER — MORPHINE SULFATE (PF) 2 MG/ML IV SOLN
2.0000 mg | INTRAVENOUS | Status: DC | PRN
  Administered 2022-03-31 – 2022-04-25 (×8): 2 mg via INTRAVENOUS
  Filled 2022-03-31 (×9): qty 1

## 2022-03-31 MED ORDER — SODIUM CHLORIDE 0.9% FLUSH
10.0000 mL | INTRAVENOUS | Status: DC | PRN
  Administered 2022-04-03: 10 mL

## 2022-03-31 MED ORDER — OXYCODONE HCL ER 20 MG PO T12A
20.0000 mg | EXTENDED_RELEASE_TABLET | Freq: Every day | ORAL | Status: DC
  Administered 2022-04-01 – 2022-04-29 (×29): 20 mg via ORAL
  Filled 2022-03-31 (×30): qty 1

## 2022-03-31 NOTE — Progress Notes (Signed)
PROGRESS NOTE    Phillip [name]  HQR:[id number] DOB: April 25, 1945 DOA: 03/30/2022 PCP: Charlane Ferretti, MD     Brief Narrative:   H/o  metastatic renal cell carcinoma with brain, lung and bone mets, H/o  seizure, h/o recent cholecystectomy a few weeks ago presenting with weakness,  Worsening confusion , hypercalcemia,  concerning for sepsis from biliary infection? But negative HIDA scan  On broad spec abx. Gen surg watching.  Had MRI Brain couple days ago showing increasing brain met size. Started on decadron then. Onco  following.    Subjective:  By report he was interactive this morning, this afternoon he is minimally interactive, opens eyes to voice briefly  Cr worsening, minimal urine output overnight, bladder scan > 600 cc, s/p foley placement this am  Wife at bedside  Assessment & Plan:  Principal Problem:   Sepsis ([place]) Active Problems:   Renal cell carcinoma ([name])   HTN (hypertension)   Seizure disorder ([place])   Hypercalcemia   Normocytic anemia    Assessment and Plan:    Presumed sepsis with fever, leukocytosis, possible biliary infection -Unclear source, from biliary? CT showed "1. Small gas collection in the gallbladder fossa following cholecystectomy. Potential abscess formation." -negative HIDA -seen by surgery "findings in the gallbladder fossa are minimal and has the appearance consistent with the hemostatic agent used at the time of surgery. The fluid there is likely too small for IR to drain or aspirate. " -Blood culture no growth, urine culture in process, respiratory RT-PCR panel negative -Continue broad-spectrum antibiotic for now  Acute metabolic encephalopathy -Likely multifactorial, including from brain mets ,possible sepsis, hypercalcemia as improved, cr worsened  -Currently too lethargic to take anything by mouth -Case discussed with oncology Dr. [name] who do not think CT of head will change medical management, case discussed with wife,  decided to cancel CT head, continue current treatment with IV antibiotic and steroid, no escalation of care   Gross hematuria/AKI/urinary retention -RN report patient present with gross hematuria, overnight minimal urine output, bladder scan greater than 600 cc, Foley placed with return of  600cc gross hematuria -Case discussed with oncology Dr. [name] who do not think urology consult will provide additional benefit as gross hematuria likely from renal cell carcinoma -Plan continue antibiotics, no escalation of care  Hypocalcemia, status post Zometa, on IV fluids Calcium improved  Normocytic anemia in the setting of malignancy and hematuria -Hemoglobin 10  Leukocytosis Likely multifactorial including steroid, possible infection, dehydration  Thrombocytosis Likely reactive  Impaired fasting blood glucose A.m. blood glucose 222 Likely from steroid  S4 RCC w/bone,  liver, pulm and brain mets Seizure d/o     - he had a recent MRI brain w/ increasing brain met size; he was started on decadron by onco; will continue for now     - continue seizure PPx    Body mass index is 31.38 kg/m..     I have Reviewed nursing notes, Vitals, pain scores, I/o's, Lab results and  imaging results since pt's last encounter, details please see discussion above  I ordered the following labs:  Unresulted Labs (From admission, onward)     Start     Ordered   04/01/22 0500  CBC with Differential/Platelet  Tomorrow morning,   R        03/31/22 1904   04/01/22 0500  Comprehensive metabolic panel  Tomorrow morning,   R        03/31/22 1904   03/31/22 0500  Creatinine,  serum  Daily,   R     Comments: Can discontinue this lab order if vancomycin discontinued    03/30/22 1451             DVT prophylaxis: SCDs Start: 03/30/22 1624   Code Status:   Code Status: DNR  Family Communication: Wife at bedside Disposition:   Dispo: The patient is from: Home              Anticipated d/c is to:  TBD              Anticipated d/c date is: TBD  Antimicrobials:    Anti-infectives (From admission, onward)    Start     Dose/Rate Route Frequency Ordered Stop   03/31/22 1600  vancomycin (VANCOREADY) IVPB 1250 mg/250 mL        1,250 mg 166.7 mL/hr over 90 Minutes Intravenous Every 24 hours 03/31/22 0843     03/31/22 1300  vancomycin (VANCOREADY) IVPB 1500 mg/300 mL  Status:  Discontinued        1,500 mg 150 mL/hr over 120 Minutes Intravenous Every 24 hours 03/30/22 1447 03/31/22 0843   03/30/22 2200  metroNIDAZOLE (FLAGYL) IVPB 500 mg  Status:  Discontinued        500 mg 100 mL/hr over 60 Minutes Intravenous Every 12 hours 03/30/22 1623 03/30/22 1630   03/30/22 2200  metroNIDAZOLE (FLAGYL) IVPB 500 mg        500 mg 100 mL/hr over 60 Minutes Intravenous Every 12 hours 03/30/22 1446     03/30/22 2100  ceFEPIme (MAXIPIME) 2 g in sodium chloride 0.9 % 100 mL IVPB        2 g 200 mL/hr over 30 Minutes Intravenous Every 8 hours 03/30/22 1440     03/30/22 1045  vancomycin (VANCOREADY) IVPB 2000 mg/400 mL        2,000 mg 200 mL/hr over 120 Minutes Intravenous  Once 03/30/22 1038 03/30/22 1529   03/30/22 1030  ceFEPIme (MAXIPIME) 2 g in sodium chloride 0.9 % 100 mL IVPB        2 g 200 mL/hr over 30 Minutes Intravenous  Once 03/30/22 1029 03/30/22 1250   03/30/22 1030  metroNIDAZOLE (FLAGYL) IVPB 500 mg        500 mg 100 mL/hr over 60 Minutes Intravenous  Once 03/30/22 1029 03/30/22 1400   03/30/22 1030  vancomycin (VANCOCIN) IVPB 1000 mg/200 mL premix  Status:  Discontinued        1,000 mg 200 mL/hr over 60 Minutes Intravenous  Once 03/30/22 1029 03/30/22 1038          Objective: Vitals:   03/31/22 0131 03/31/22 0513 03/31/22 0717 03/31/22 1548  BP: (!) 164/86 (!) 156/89  (!) 153/89  Pulse: 98 90  91  Resp: (!) '22 20 20 20  '$ Temp:    98 F (36.7 C)  TempSrc:    Oral  SpO2:  98%  100%  Weight:      Height:        Intake/Output Summary (Last 24 hours) at 03/31/2022 1907 Last  data filed at 03/31/2022 1832 Gross per 24 hour  Intake 973 ml  Output 1050 ml  Net -77 ml   Filed Weights   03/30/22 1029  Weight: 102.1 kg    Examination:  General exam: Lethargic, opens eyes to voice briefly Respiratory system: Clear to auscultation.  Intermittent tachypnea Cardiovascular system:  RRR.  Gastrointestinal system: Abdomen is nondistended, soft and nontender.  Normal bowel sounds  heard. Central nervous system: Lethargic, opens eyes to voice briefly, does not follow commands consistently,. Extremities:  no edema Skin: No rashes, lesions or ulcers Psychiatry: Lethargic    Data Reviewed: I have personally reviewed  labs and visualized  imaging studies since the last encounter and formulate the plan        Scheduled Meds:  Chlorhexidine Gluconate Cloth  6 each Topical Daily   dexamethasone (DECADRON) injection  4 mg Intravenous Daily   oxyCODONE  10-20 mg Oral Daily   oxyCODONE  20 mg Oral QHS   testosterone  5 g Transdermal Daily   Continuous Infusions:  sodium chloride 100 mL/hr at 03/31/22 8828   ceFEPime (MAXIPIME) IV 2 g (03/31/22 1209)   levETIRAcetam 250 mg (03/31/22 1832)   metronidazole 500 mg (03/31/22 1101)   vancomycin 1,250 mg (03/31/22 1557)     LOS: 1 day   Time spent: 65mns  FFlorencia Reasons MD PhD FACP [facility]  Available via Epic secure chat 7am-7pm for nonurgent issues Please page for urgent issues To page the attending provider between 7A-7P or the covering provider during after hours 7P-7A, please log into the web site [email or web address] and access using universal  password for that web site. If you do not have the password, please call the hospital operator.    03/31/2022, 7:07 PM

## 2022-03-31 NOTE — Progress Notes (Signed)
Pharmacy Antibiotic Note  Phillip [name] is a 77 y.o. male admitted on 03/30/2022 with sepsis.  PMH significant for metastatic renal cell carcinoma, HTN, seizure. S/p cholecystectomy on 03/15/22. Pharmacy has been consulted for vancomycin and cefepime dosing.  Today, 03/31/22 Day #2 abx SCr increased 0.71 >> 1.27 WBC elevated on steroids Afebrile this AM  Plan: Continue Cefepime 2 g IV q8h + metronidazole 500 mg IV q12h Adjust vancomycin to 1250 mg IV q24h for estimated AUC of 515. Goal vancomycin AUC 400-550. Check levels at steady state as needed.  Monitor renal function, culture data.   Height: '5\' 11"'$  (180.3 cm) Weight: 102.1 kg (225 lb) IBW/kg (Calculated) : 75.3  Temp (24hrs), Avg:98.7 F (37.1 C), Min:97.4 F (36.3 C), Max:101.9 F (38.8 C)  Recent Labs  Lab 03/29/22 1031 03/30/22 1138 03/31/22 0417  WBC 16.4* 13.8* 20.8*  CREATININE 0.56* 0.71 1.27*  LATICACIDVEN  --  1.7  --      Estimated Creatinine Clearance: 59.3 mL/min (A) (by C-G formula based on SCr of 1.27 mg/dL (H)).    Allergies  Allergen Reactions   Contrast Media [Iodinated Contrast Media] Rash   Antimicrobials this admission: cefepime 2/2 >>  vancomycin 2/2 >> Metronidazole 2/2 >>  Dose adjustments this admission:  Microbiology results: 2/2 BCx: ngtd  Peggyann Juba, PharmD, BCPS Pharmacy: [id number] 03/31/2022 8:57 AM

## 2022-03-31 NOTE — Progress Notes (Signed)
Subjective/Chief Complaint: He denies abdominal pain this morning Not hungery   Objective: Vital signs in last 24 hours: Temp:  [97.4 F (36.3 C)-101.9 F (38.8 C)] 98.2 F (36.8 C) (02/03 0001) Pulse Rate:  [78-98] 90 (02/03 0513) Resp:  [18-31] 20 (02/03 0717) BP: (145-178)/(64-92) 156/89 (02/03 0513) SpO2:  [91 %-100 %] 98 % (02/03 0513) Weight:  [102.1 kg] 102.1 kg (02/02 1029)    Intake/Output from previous day: 02/02 0701 - 02/03 0700 In: 3983.7 [I.V.:480.7; IV Piggyback:3503] Out: 250 [Urine:250] Intake/Output this shift: No intake/output data recorded.  Exam: Awake and following commands but a little somnolent Abdomen soft, incisions clean, no peritoneal signs  Lab Results:  Recent Labs    03/30/22 1138 03/31/22 0417  WBC 13.8* 20.8*  HGB 9.6* 10.0*  HCT 33.7* 34.1*  PLT 530* 645*   BMET Recent Labs    03/30/22 1138 03/31/22 0417  NA 137 136  K 4.2 4.5  CL 99 105  CO2 27 19*  GLUCOSE 115* 222*  BUN 12 26*  CREATININE 0.71 1.27*  CALCIUM 10.6* 10.1   PT/INR Recent Labs    03/30/22 1138 03/31/22 0417  LABPROT 15.7* 15.8*  INR 1.3* 1.3*   ABG No results for input(s): "PHART", "HCO3" in the last 72 hours.  Invalid input(s): "PCO2", "PO2"  Studies/Results: NM HEPATOBILIARY LEAK (POST-SURGICAL)  Result Date: 03/30/2022 CLINICAL DATA:  Postop cholecystectomy.  Abdominal pain and sepsis. EXAM: NUCLEAR MEDICINE HEPATOBILIARY IMAGING TECHNIQUE: Sequential images of the abdomen were obtained out to 60 minutes following intravenous administration of radiopharmaceutical. RADIOPHARMACEUTICALS:  5.3 mCi Tc-73m Choletec IV COMPARISON:  CT AP 03/30/2022 FINDINGS: Dynamic imaging was performed for 1 hour. Patient was unable to tolerate an additional hour of imaging. During the first hour there is prompt uptake and biliary excretion of activity by the liver is seen. Biliary activity passes into small bowel, consistent with patent common bile duct. No  signs of radiotracer extravasation or abnormal collection of radiotracer activity to suggest bile leak. IMPRESSION: 1. No abnormal extravasation of the radiopharmaceutical to suggest active bile leak. 2. Patent common bile duct with normal biliary to bowel activity. Electronically Signed   By: TKerby MoorsM.D.   On: 03/30/2022 16:59   CT CHEST ABDOMEN PELVIS WO CONTRAST  Result Date: 03/30/2022 CLINICAL DATA:  Sepsis. Renal cell carcinoma. Lumbar metastasis. * Tracking Code: BO * EXAM: CT CHEST, ABDOMEN AND PELVIS WITHOUT CONTRAST TECHNIQUE: Multidetector CT imaging of the chest, abdomen and pelvis was performed following the standard protocol without IV contrast. RADIATION DOSE REDUCTION: This exam was performed according to the departmental dose-optimization program which includes automated exposure control, adjustment of the mA and/or kV according to patient size and/or use of iterative reconstruction technique. COMPARISON:  None Available. FINDINGS: CT CHEST FINDINGS Cardiovascular: Coronary artery calcification and aortic atherosclerotic calcification. Port in the anterior chest wall with tip in distal SVC. Mediastinum/Nodes: No axillary or supraclavicular adenopathy. No mediastinal or hilar adenopathy. No pericardial fluid. Esophagus normal. Lungs/Pleura: New nodule in the medial RIGHT lung apex measuring 8 mm (49/8) new nodule in the inferior LEFT lower lobe measuring 5 mm (is 112/8) New nodule along the LEFT oblique fissure medially measures 5 mm (60/8) Musculoskeletal: Again demonstrated expansile lytic lesion in the medial RIGHT second rib (image 35/8. Similar pattern in the medial eleventh rib. Metastatic pattern within the first vertebral body additionally. No new lesions are present. CT ABDOMEN AND PELVIS FINDINGS Hepatobiliary: No focal lesion on noncontrast exam. Postcholecystectomy. There is  small a collection the gallbladder fossa (image 63/2). This collection measures 21 mm x 18 mm. Pancreas:  Pancreas is normal. Spleen: Normal spleen Adrenals/urinary tract: Adrenal glands and kidneys are normal. The ureters and bladder normal. Stomach/Bowel: Stomach, small bowel, appendix, and cecum are normal. The colon and rectosigmoid colon are normal. Vascular/Lymphatic: Abdominal aorta is normal caliber. There is no retroperitoneal or periportal lymphadenopathy. No pelvic lymphadenopathy. Reproductive: Other: No free fluid. Musculoskeletal: The metastatic lesion in the LEFT acetabulum appears increased in volume measuring 4.8 x 3.3 cm compared to 3.42.5 cm. This lesion is lytic with soft tissue expansion in the operator space. Stable L4 lumbar metastasis. IMPRESSION: CHEST IMPRESSION: 1. 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 2. No pulmonary infection 3. Stable skeletal metastasis. PELVIS IMPRESSION: 1. Small gas collection in the gallbladder fossa following cholecystectomy. Potential abscess formation. 2. Expansion of LEFT acetabular metastasis into the operator fossa. 3.  Aortic Atherosclerosis (ICD10-I70.0). Electronically Signed   By: Suzy Bouchard M.D.   On: 03/30/2022 12:01   DG Chest Portable 1 View  Result Date: 03/30/2022 CLINICAL DATA:  Shortness of breath EXAM: PORTABLE CHEST 1 VIEW COMPARISON:  01/08/2022 FINDINGS: Right chest port with catheter tip at the superior cavoatrial junction. Somewhat low lung volumes. Cardiac and mediastinal contours are within normal limits. No focal pulmonary opacity. No pleural effusion or pneumothorax. No acute osseous abnormality. IMPRESSION: No acute cardiopulmonary process. Electronically Signed   By: Merilyn Baba M.D.   On: 03/30/2022 11:49    Anti-infectives: Anti-infectives (From admission, onward)    Start     Dose/Rate Route Frequency Ordered Stop   03/31/22 1600  vancomycin (VANCOREADY) IVPB 1250 mg/250 mL        1,250 mg 166.7 mL/hr over 90 Minutes Intravenous Every 24 hours 03/31/22 0843     03/31/22 1300  vancomycin  (VANCOREADY) IVPB 1500 mg/300 mL  Status:  Discontinued        1,500 mg 150 mL/hr over 120 Minutes Intravenous Every 24 hours 03/30/22 1447 03/31/22 0843   03/30/22 2200  metroNIDAZOLE (FLAGYL) IVPB 500 mg  Status:  Discontinued        500 mg 100 mL/hr over 60 Minutes Intravenous Every 12 hours 03/30/22 1623 03/30/22 1630   03/30/22 2200  metroNIDAZOLE (FLAGYL) IVPB 500 mg        500 mg 100 mL/hr over 60 Minutes Intravenous Every 12 hours 03/30/22 1446     03/30/22 2100  ceFEPIme (MAXIPIME) 2 g in sodium chloride 0.9 % 100 mL IVPB        2 g 200 mL/hr over 30 Minutes Intravenous Every 8 hours 03/30/22 1440     03/30/22 1045  vancomycin (VANCOREADY) IVPB 2000 mg/400 mL        2,000 mg 200 mL/hr over 120 Minutes Intravenous  Once 03/30/22 1038 03/30/22 1529   03/30/22 1030  ceFEPIme (MAXIPIME) 2 g in sodium chloride 0.9 % 100 mL IVPB        2 g 200 mL/hr over 30 Minutes Intravenous  Once 03/30/22 1029 03/30/22 1250   03/30/22 1030  metroNIDAZOLE (FLAGYL) IVPB 500 mg        500 mg 100 mL/hr over 60 Minutes Intravenous  Once 03/30/22 1029 03/30/22 1400   03/30/22 1030  vancomycin (VANCOCIN) IVPB 1000 mg/200 mL premix  Status:  Discontinued        1,000 mg 200 mL/hr over 60 Minutes Intravenous  Once 03/30/22 1029 03/30/22 1038       Assessment/Plan:  S/p laparoscopic cholecystectomy by Dr. Ninfa Linden on 03/15/22 Fluid collection in GB fossa w/ mild elevated LFT's Fever  HIDA scan negative for bile leak or biliary obstruction.    I reviewed the CT scan and findings in the gallbladder fossa are minimal and has the appearance consistent with the hemostatic agent used at the time of surgery.  The fluid there is likely too small for IR to drain or aspirate.  I would expect a more impressive scan if it were an abscess given the WBC elevation and fever  WBC up to 20.  Ok from surgical standpoint to try liquids  Coralie Keens MD 03/31/2022

## 2022-03-31 NOTE — Progress Notes (Signed)
IP PROGRESS NOTE  Subjective:   Phillip [name] is alert this morning.  His wife is at the bedside.  No abdominal pain.  No specific complaint.    Objective: Vital signs in last 24 hours: Blood pressure (!) 156/89, pulse 90, temperature 98.2 F (36.8 C), temperature source Oral, resp. rate 20, height '5\' 11"'$  (1.803 m), weight 225 lb (102.1 kg), SpO2 98 %.  Intake/Output from previous day: 02/02 0701 - 02/03 0700 In: 3883.7 [I.V.:480.7; IV Piggyback:3403] Out: -   Physical Exam:  HEENT: The tongue is dry,, no thrush Lungs: Mild bilateral expiratory wheeze, no respiratory distress Cardiac: Regular rate and rhythm Abdomen: Mildly distended, healed surgical incisions, no mass, nontender Extremities: No leg edema Neurologic: Alert, moves all extremities to command, oriented to place  Portacath/PICC-without erythema  Lab Results: Recent Labs    03/30/22 1138 03/31/22 0417  WBC 13.8* 20.8*  HGB 9.6* 10.0*  HCT 33.7* 34.1*  PLT 530* 645*    BMET Recent Labs    03/30/22 1138 03/31/22 0417  NA 137 136  K 4.2 4.5  CL 99 105  CO2 27 19*  GLUCOSE 115* 222*  BUN 12 26*  CREATININE 0.71 1.27*  CALCIUM 10.6* 10.1    Lab Results  Component Value Date   CEA1 1.77 01/04/2016    Studies/Results: NM HEPATOBILIARY LEAK (POST-SURGICAL)  Result Date: 03/30/2022 CLINICAL DATA:  Postop cholecystectomy.  Abdominal pain and sepsis. EXAM: NUCLEAR MEDICINE HEPATOBILIARY IMAGING TECHNIQUE: Sequential images of the abdomen were obtained out to 60 minutes following intravenous administration of radiopharmaceutical. RADIOPHARMACEUTICALS:  5.3 mCi Tc-37m Choletec IV COMPARISON:  CT AP 03/30/2022 FINDINGS: Dynamic imaging was performed for 1 hour. Patient was unable to tolerate an additional hour of imaging. During the first hour there is prompt uptake and biliary excretion of activity by the liver is seen. Biliary activity passes into small bowel, consistent with patent common bile duct. No  signs of radiotracer extravasation or abnormal collection of radiotracer activity to suggest bile leak. IMPRESSION: 1. No abnormal extravasation of the radiopharmaceutical to suggest active bile leak. 2. Patent common bile duct with normal biliary to bowel activity. Electronically Signed   By: TKerby MoorsM.D.   On: 03/30/2022 16:59   CT CHEST ABDOMEN PELVIS WO CONTRAST  Result Date: 03/30/2022 CLINICAL DATA:  Sepsis. Renal cell carcinoma. Lumbar metastasis. * Tracking Code: BO * EXAM: CT CHEST, ABDOMEN AND PELVIS WITHOUT CONTRAST TECHNIQUE: Multidetector CT imaging of the chest, abdomen and pelvis was performed following the standard protocol without IV contrast. RADIATION DOSE REDUCTION: This exam was performed according to the departmental dose-optimization program which includes automated exposure control, adjustment of the mA and/or kV according to patient size and/or use of iterative reconstruction technique. COMPARISON:  None Available. FINDINGS: CT CHEST FINDINGS Cardiovascular: Coronary artery calcification and aortic atherosclerotic calcification. Port in the anterior chest wall with tip in distal SVC. Mediastinum/Nodes: No axillary or supraclavicular adenopathy. No mediastinal or hilar adenopathy. No pericardial fluid. Esophagus normal. Lungs/Pleura: New nodule in the medial RIGHT lung apex measuring 8 mm (49/8) new nodule in the inferior LEFT lower lobe measuring 5 mm (is 112/8) New nodule along the LEFT oblique fissure medially measures 5 mm (60/8) Musculoskeletal: Again demonstrated expansile lytic lesion in the medial RIGHT second rib (image 35/8. Similar pattern in the medial eleventh rib. Metastatic pattern within the first vertebral body additionally. No new lesions are present. CT ABDOMEN AND PELVIS FINDINGS Hepatobiliary: No focal lesion on noncontrast exam. Postcholecystectomy. There is small  a collection the gallbladder fossa (image 63/2). This collection measures 21 mm x 18 mm. Pancreas:  Pancreas is normal. Spleen: Normal spleen Adrenals/urinary tract: Adrenal glands and kidneys are normal. The ureters and bladder normal. Stomach/Bowel: Stomach, small bowel, appendix, and cecum are normal. The colon and rectosigmoid colon are normal. Vascular/Lymphatic: Abdominal aorta is normal caliber. There is no retroperitoneal or periportal lymphadenopathy. No pelvic lymphadenopathy. Reproductive: Other: No free fluid. Musculoskeletal: The metastatic lesion in the LEFT acetabulum appears increased in volume measuring 4.8 x 3.3 cm compared to 3.42.5 cm. This lesion is lytic with soft tissue expansion in the operator space. Stable L4 lumbar metastasis. IMPRESSION: CHEST IMPRESSION: 1. 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 2. No pulmonary infection 3. Stable skeletal metastasis. PELVIS IMPRESSION: 1. Small gas collection in the gallbladder fossa following cholecystectomy. Potential abscess formation. 2. Expansion of LEFT acetabular metastasis into the operator fossa. 3.  Aortic Atherosclerosis (ICD10-I70.0). Electronically Signed   By: Suzy Bouchard M.D.   On: 03/30/2022 12:01   DG Chest Portable 1 View  Result Date: 03/30/2022 CLINICAL DATA:  Shortness of breath EXAM: PORTABLE CHEST 1 VIEW COMPARISON:  01/08/2022 FINDINGS: Right chest port with catheter tip at the superior cavoatrial junction. Somewhat low lung volumes. Cardiac and mediastinal contours are within normal limits. No focal pulmonary opacity. No pleural effusion or pneumothorax. No acute osseous abnormality. IMPRESSION: No acute cardiopulmonary process. Electronically Signed   By: Merilyn Baba M.D.   On: 03/30/2022 11:49    Medications: I have reviewed the patient's current medications.  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, L4 nerve compression Biopsy of the L4 mass 11/18/2015 confirmed metastatic renal cell carcinoma, clear cell type CTs of the chest,  abdomen, and pelvis 11/18/2015-right lower lobe nodule, expansile lytic lesion at the right 11th rib/costal vertebral junction, left renal mass, expansile lesion involving the L4 vertebra, lytic lesion at the left acetabulum, and a low-attenuation liver lesion Initiation of SRS to L4 12/02/2015, Completed 12/12/2015 Initiation of Pazopanib 12/30/2015 Pazopanib placed on hold 02/06/2016 secondary to elevated liver enzymes Pazopanib resumed 03/07/2016 at a dose of 400 mg daily  Pazopanib discontinued 03/19/2016 secondary to elevated liver enzymes Restaging CTs 04/02/2016-stable left renal mass, decreased soft tissue component associated with the L4 metastasis, increased soft tissue component associated with the right 11th rib metastasis with increased T11 bony destruction, increased sclerosis at the left acetabulum lesion Cycle 1 nivolumab 04/12/2016 Cycle 2 nivolumab 04/26/2016 Cycle 3 nivolumab 05/11/2016 Cycle 4 nivolumab 05/24/2016 Cycle 5 nivolumab 06/08/2016 MRI lumbar spine 06/21/2016-unchanged tumor at L3, increased size of retroperitoneal lymph nodes compared to a CT from 04/02/2016 Cycle 6 nivolumab  06/22/2016 CTs chest, abdomen, and pelvis 07/04/2016-enlargement of the left renal mass, right adrenal nodule, left hilar and peritoneal lymph nodes, enlargement of left acetabular lesion. Stable lung nodules. Cycle 7 nivolumab 07/06/2016 Cycle 8 nivolumab 07/20/2016 Cycle 9 nivolumab 08/02/2016 Cycle 10 nivolumab 08/20/2016 Restaging CT 09/03/2016 evaluation with stable disease Cycle 11 nivolumab 09/05/2016 Cycle 12 nivolumab 09/19/2016 Cycle 13 nivolumab 10/03/2016 Cycle 14 nivolumab 10/17/2016 Cycle 15 nivolumab 10/31/2016 Cycle 16 nivolumab 11/14/2016 (changed to monthly schedule) Cycle 17 nivolumab 12/19/2016 CTs 01/21/2017-increased left renal mass, increased size of adrenal metastases, increased lytic bone lesions, increased left lung nodule, persistent tumor at L4 with probable  epidural component Initiation of Cabozantinib 01/28/2017 Restaging CTs 05/30/2017- decreased size of left hilar mass, left renal mass, retroperitoneal adenopathy, and adrenal metastasis.  Healing bone lesions. Cabozantinib continued CTs 10/21/2017- interval enlargement  left hilar lymph node; stable rib lesions; stable mass left renal cortex; stable mildly nodular adrenal glands; stable lytic lesions within the pelvis and spine. Cabozantinib continued CTs 02/21/2018- enlargement of an AP window lymph node.  Mildly enlarged left hilar lymph node is unchanged.  Primary renal cell carcinoma involving the upper pole of the left kidney appears similar.  Stable enlarged right periaortic lymph node adjacent to the renal vessels.  Stable multifocal bony metastatic disease. Cabozantinib continued CTs 07/29/2018- stable 15 mm AP window nodes; stable left hilar node; subcarinal node slightly larger; right periaortic node 16 mm, previously 12 mm; portacaval node 24 mm, previously 16 mm; 15 mm node superior to the pancreatic head has enlarged; interval increase nodularity of both adrenal glands; stable left kidney mass; multifocal bony metastatic disease not significantly changed. Cabozantinib continued CTs 11/06/2018- moderate improvement in thoracic adenopathy; minimal improvement abdominal adenopathy; left kidney upper pole mass and various lytic expansile bone lesions stable; mild increase in nodularity of left adrenal gland; right adrenal gland nodularity stable. 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  Peripheral enhancing masses consistent with metastases. No hemorrhage. MRI brain 03/11/2019-7 enhancing brain masses consistent with metastatic disease SRS to 7 brain lesions, treatment given 03/19/2019, 03/23/2019, and 03/25/2019 CTs 04/09/2019-decrease in left renal mass, bilateral adrenal nodules, AP window  and porta hepatic adenopathy.  New 9 mm right lower lobe nodule.  Improved lytic lesion of the left acetabulum.  Other bone lesions are stable. Cabozantinib continued MRI brain 07/17/2019-resolution of 4 mm treated lesion in the right frontal cortex, 6 remaining treated lesions have decreased in size, new punctate metastasis in the superior right cerebellum SRS to right cerebellar lesion 07/30/2019 CTs 08/17/2019-previously noted right lower lobe nodule resolved, stable left kidney mass, mixed lytic/sclerotic bone lesions in the thoracolumbar spine, right posterior ribs, and left acetabulum-unchanged, no evidence of progressive disease  Cabozantinib continued MRI brain 10/23/2019-stable to slight decrease in size of multiple enhancing intracranial lesions.  Slight increase in surrounding edema in the medial left frontal lobe.  No new lesions present. MRI brain 01/29/2020-multiple enhancing brain lesions, some hemorrhagic, some with mild enlargement-treatment effect? CTs 02/08/2020-no thoracic metastases, enlargement of the left renal mass, enlargement of lytic lesion at T9, other lytic lesions unchanged MRI brain 05/04/2020-no new lesions, slight enlargement of a right occipital lesion, other lesions are stable or decreased in size CTs 08/11/2020-enlargement of left kidney mass, new 1.6 cm segment 7 liver lesion, enlargement of a lytic lesion at T9, increased lysis of a sclerotic lesion at the right second rib, 0.7 cm endoluminal nodule at the bladder dome-enlarged Brain MRI 08/10/2020-slight increase in a 15 mm left frontal lobe lesion, new punctate focus in the right occipital cortex, other lesions stable or slightly decreased CTs 11/01/2020-increase in left suprahilar opacity, enlargement of previous liver metastases, several new subcentimeter lesions, increase in left renal mass, similar appearance of bone metastases, stable hyperdense/enhancing nodule in the left bladder Brain MRI 11/04/2020-no change in brain  metastases Lenvatinib/pembrolizumab 11/30/2020 CTs 03/03/2021-stable left hilar lymph nodes; resolution of left upper lobe perihilar nodularity; unchanged for millimeter peripheral right upper lobe nodule; multiple lytic bone lesions appear stable; left kidney lesion decreased in size; bilateral adrenal nodules decreased in size; several liver lesion showed mild increase in size; several new small lesions within the right hepatic lobe. Lenvatinib/pembrolizumab continued 03/07/2021 Brain MRI 03/10/2021-progression of a left frontal metastasis, tiny new metastasis in the right frontal lobe-referred  for SBRT to the right frontal lesion and to [facility] to consider a LITT procedure for the progressive left frontal lesion SRS 04/06/2021, LITT procedure at [facility] 04/18/2021-pathology metastatic renal cell carcinoma in background of necrosis/gliosis Lenvatinib on hold Pembrolizumab held 04/24/2021 Pembrolizumab 05/01/2021, lenvatinib remains on hold pending surgical evaluation Pembrolizumab 05/25/2021, lenvatinib resumed Pembrolizumab 06/15/2021 Lenvima resumed 06/21/2021 CTs 07/28/2021-continued regression left renal lesion.  Much improved appearance of the liver.  Continued regression of bilateral adrenal gland nodules.  Stable mixed lytic and sclerotic metastatic bone disease.  No new or progressive findings. Pembrolizumab 07/31/2021, lenvatinib continued Pembrolizumab held 08/21/2021 due to generalized weakness Pembrolizumab resumed 09/11/2021 MRI brain 10/19/2021-"new "focus of minimal enhancement of the right frontal lobe, larger punctate right cerebellar lesion, review of MRI found the right frontal lesion was present in 2021 and was treated with SRS CTs 11/12/2021-2 new right upper lung nodules, stable left renal lesion, enlarging lytic lesion in the left fourth rib Pembrolizumab and lenvatinib continued Brain 01/02/2022-new 4 mm lesion in the right parietal cortex the largest lesion in left frontal lobe stable in  size with more solid  CTs chest, abdomen, and pelvis 01/08/2022-increased soft tissue component of a lytic left acetabular  metastasis and increased sclerotic component of the left fourth rib metastasis, decreased conspicuity of right upper lobe nodules, no change in renal mass Pembrolizumab and lenvatinib continued MRI brain 03/27/2022-minimal increase size of a 3 mm right cerebellar lesion, increased size of enhancing mass at the left frontal lobe measuring 3 x 2.2 cm with adjacent gyriform enhancement and mild worsening of edema in the left frontal white matter CTs 03/30/2022-new pulmonary nodules, small gas collection in the gallbladder fossa, increased left acetabular metastasis   Pain secondary to #1-managed by Dr. Lovenia Shuck.  Improved Hypertension Elevated transaminases 02/06/2016- Pazopanib placed on hold Liver enzymes normal 03/07/2016 Port-A-Cath placement 05/16/2016 Malaise/anorexia 09/05/2016. Cortisol and testosterone levels low. Hydrocortisone and testosterone replacement initiated. Conjunctival/scleral erythema 09/19/2016-resolved with steroid eyedrops Proximal right leg weakness. 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 Hypercalcemia status post Zometa 01/23/2017-resolved Hypercalcemia 03/29/2022-Zometa Pruritic rash following IV contrast 10/21/2017; rash following IV contrast 02/21/2018 despite prednisone/Benadryl premedication Brief episodes of expressive aphasia October and December 2020 Vitamin B12 deficiency confirmed on lab 03/07/2021-started oral vitamin B12 replacement 03/28/2021 Admission 01/08/2022 with acute cholecystitis/sepsis, cholecystostomy tube Cholecystectomy 03/15/2022 14.  Admission 03/30/2022 with failure to thrive and fever   Phillip [name] has metastatic renal cell carcinoma.  A brain MRI this week confirmed increase in the size of a dominant left frontal lobe metastasis with mild worsening of edema.  He had hypercalcemia yesterday.   Lenvatinib and pembrolizumab were discontinued and he was treated with IV fluids and Zometa.  He was admitted yesterday with a fever and failure to thrive.  There is no apparent source for infection.  The fever has resolved.  Cultures are negative to date.  A biliary scan was negative for a bile leak.  Hypercalcemia is improved following Zometa and IV fluids yesterday.  The elevated white count is still elevated, secondary to Decadron versus infection.  I discussed the current situation with his wife.  She understands the overall poor prognosis.  He has been placed on a no CODE BLUE status.  A referral has been made to home palliative care.  We will consider a full hospice referral depending on his recovery over the next few days.  Recommendations: Continue broad-spectrum intravenous antibiotics, follow-up cultures Continue Decadron I will start IV fluids since  he has been made n.p.o. Physical therapy evaluation Advance diet when okay with the surgical service     LOS: 1 day   Betsy Coder, MD   03/31/2022, 6:30 AM

## 2022-04-01 DIAGNOSIS — C641 Malignant neoplasm of right kidney, except renal pelvis: Secondary | ICD-10-CM | POA: Diagnosis not present

## 2022-04-01 DIAGNOSIS — C649 Malignant neoplasm of unspecified kidney, except renal pelvis: Secondary | ICD-10-CM | POA: Diagnosis not present

## 2022-04-01 DIAGNOSIS — G9341 Metabolic encephalopathy: Secondary | ICD-10-CM | POA: Diagnosis not present

## 2022-04-01 LAB — COMPREHENSIVE METABOLIC PANEL
ALT: 48 U/L — ABNORMAL HIGH (ref 0–44)
AST: 16 U/L (ref 15–41)
Albumin: 2.3 g/dL — ABNORMAL LOW (ref 3.5–5.0)
Alkaline Phosphatase: 125 U/L (ref 38–126)
Anion gap: 13 (ref 5–15)
BUN: 36 mg/dL — ABNORMAL HIGH (ref 8–23)
CO2: 19 mmol/L — ABNORMAL LOW (ref 22–32)
Calcium: 9.1 mg/dL (ref 8.9–10.3)
Chloride: 107 mmol/L (ref 98–111)
Creatinine, Ser: 1.27 mg/dL — ABNORMAL HIGH (ref 0.61–1.24)
GFR, Estimated: 58 mL/min — ABNORMAL LOW (ref >60)
Glucose, Bld: 166 mg/dL — ABNORMAL HIGH (ref 70–99)
Potassium: 4.3 mmol/L (ref 3.5–5.1)
Sodium: 139 mmol/L (ref 135–145)
Total Bilirubin: 0.2 mg/dL — ABNORMAL LOW (ref 0.3–1.2)
Total Protein: 5.9 g/dL — ABNORMAL LOW (ref 6.5–8.1)

## 2022-04-01 LAB — URINALYSIS, W/ REFLEX TO CULTURE (INFECTION SUSPECTED)
Bilirubin Urine: NEGATIVE
Glucose, UA: NEGATIVE mg/dL
Ketones, ur: NEGATIVE mg/dL
Leukocytes,Ua: NEGATIVE
Nitrite: NEGATIVE
Protein, ur: NEGATIVE mg/dL
Specific Gravity, Urine: 1.013 (ref 1.005–1.030)
pH: 5 (ref 5.0–8.0)

## 2022-04-01 LAB — CBC WITH DIFFERENTIAL/PLATELET
Abs Immature Granulocytes: 0.2 10*3/uL — ABNORMAL HIGH (ref 0.00–0.07)
Basophils Absolute: 0.1 10*3/uL (ref 0.0–0.1)
Basophils Relative: 0 %
Eosinophils Absolute: 0 10*3/uL (ref 0.0–0.5)
Eosinophils Relative: 0 %
HCT: 30.4 % — ABNORMAL LOW (ref 39.0–52.0)
Hemoglobin: 9 g/dL — ABNORMAL LOW (ref 13.0–17.0)
Immature Granulocytes: 1 %
Lymphocytes Relative: 7 %
Lymphs Abs: 1.8 10*3/uL (ref 0.7–4.0)
MCH: 25.4 pg — ABNORMAL LOW (ref 26.0–34.0)
MCHC: 29.6 g/dL — ABNORMAL LOW (ref 30.0–36.0)
MCV: 85.9 fL (ref 80.0–100.0)
Monocytes Absolute: 1.8 10*3/uL — ABNORMAL HIGH (ref 0.1–1.0)
Monocytes Relative: 7 %
Neutro Abs: 20.8 10*3/uL — ABNORMAL HIGH (ref 1.7–7.7)
Neutrophils Relative %: 85 %
Platelets: 503 10*3/uL — ABNORMAL HIGH (ref 150–400)
RBC: 3.54 MIL/uL — ABNORMAL LOW (ref 4.22–5.81)
RDW: 18.3 % — ABNORMAL HIGH (ref 11.5–15.5)
WBC: 24.8 10*3/uL — ABNORMAL HIGH (ref 4.0–10.5)
nRBC: 0 % (ref 0.0–0.2)

## 2022-04-01 NOTE — Consult Note (Signed)
Chief Complaint: Patient was seen in consultation today for leukocytosis at the request of General Surgery  Referring Physician(s): Coralie Keens, MD  Supervising Physician: Michaelle Birks  Patient Status: [facility] - In-pt  History of Present Illness: Phillip [name] is a 77 y.o. male with history of metastatic renal cell carcinoma with brain, lung and bone mets, H/o  seizure disorder, h/o recent cholecystectomy a few weeks ago presenting with weakness, confusion , hypercalcemia, and leukocytosis concerning for sepsis.   Phillip [name] is known to IR from having a percutaneous cholecystostomy 12/2021. Post surgical HIDA is negative for leak or ductal dilatation.  CT is positive for small collection in the gallbladder fossa 97m x 111m  IR asked for aspiration at this site.  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   Chronic kidney disease    renal cancer   Constipation    Depression    Hypertension    Low testosterone in male    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   [place]   last seizure 01/2019 - controlled since on keppra   Tachycardia    Wears glasses     Past Surgical History:  Procedure Laterality Date   CHOLECYSTECTOMY N/A 03/15/2022   Procedure: LAPAROSCOPIC CHOLECYSTECTOMY;  Surgeon: BlCoralie KeensMD;  [place] [facility] [place] [facility];  Laterality: N/A;   insertion port-a-cath  2018   IR GENERIC HISTORICAL  11/18/2015   IR FLUORO GUIDED NEEDLE PLCondeSPIRATION/INJECTION LOC 11/18/2015 SaLuanne BrasMD [facility]-INTERV RAD   IR GENERIC HISTORICAL  05/16/2016   IR FLUORO GUIDE PORT INSERTION RIGHT 05/16/2016 HeJacqulynn CadetMD [facility]-INTERV RAD   IR GENERIC HISTORICAL  05/16/2016   IR [place] VASC ACCESS RIGHT 05/16/2016 HeJacqulynn CadetMD [facility]INTER[facility] RAD   IR PERC CHOLECYSTOSTOMY  01/09/2022   [place]  2023   [place]   Procedure: MRI  BRAIN WITH AND WITHOUT CONTRAST WITH ANESTHESIA;  Surgeon: Radiologist, Medication, MD;  [place] [facility] [facility] [place];  Laterality: N/A;    Allergies: Contrast media [iodinated contrast media]  Medications: Prior to Admission medications   Medication Sig Start Date End Date Taking? Authorizing Provider  Cholecalciferol (VITAMIN D) 50 MCG (2000 UT) CAPS Take 2,000 Units by mouth daily.   Yes [provider]  Cyanocobalamin (VITAMIN B12) 1000 MCG TBCR Take 1,000 mcg by mouth daily.   Yes [provider]  dexamethasone (DECADRON) 4 MG tablet Take 1 tablet (4 mg total) by mouth daily. Take for 14 days 03/29/22  Yes ShLadell PierMD  docusate sodium (COLACE) 100 MG capsule Take 200 mg by mouth 2 (two) times daily.   Yes [provider]  dorzolamide (TRUSOPT) 2 % ophthalmic solution Instill 1 drop into right eye three times daily. 10/27/21  Yes   levETIRAcetam (KEPPRA) 500 MG tablet Take 1 tablet (500 mg total) by mouth 2 (two) times daily. Patient taking differently: Take 250 mg by mouth 2 (two) times daily. 02/28/22  Yes ShLadell PierMD  lidocaine-prilocaine (EMLA) cream APPLY TO PORTACATH 1 HOUR PRIOR TO USE AS NEEDED Patient taking differently: Apply 1 Application topically as needed (port). APPLY TO PORTACATH 1 HOUR PRIOR TO USE AS NEEDED 11/13/21  Yes ShLadell PierMD  losartan-hydrochlorothiazide (HYZAAR) 100-12.5 MG tablet Take 1 tablet by mouth once a day. 12/29/21  Yes   mirtazapine (REMERON) 30 MG tablet Take 1 tablet (30 mg total) by mouth at bedtime. 12/29/21  Yes [name], MD  ondansetron (ZOFRAN) 8 MG tablet Take 1 tablet (8 mg total) by mouth every 8 (eight) hours as needed for nausea for vomiting. 01/29/22  Yes [name], MD  oxyCODONE (OXY IR/ROXICODONE) 5 MG immediate release tablet Take 1 tablet (5 mg total) by mouth every 6 (six) hours as needed for severe pain. 03/28/22  Yes Owens Shark, NP  oxyCODONE (OXYCONTIN) 10 mg 12 hr  tablet Take 2 tablets (20 mg total) by mouth every 12 (twelve) hours. Patient taking differently: Take 10-20 mg by mouth See admin instructions. Take 10-20 mg in the morning and 20 mg at night 02/28/22  Yes Owens Shark, NP  polyethylene glycol (MIRALAX / GLYCOLAX) 17 g packet Take 17 g by mouth daily as needed for moderate constipation.   Yes [provider]  prochlorperazine (COMPAZINE) 10 MG tablet Take 1 tablet by mouth every 6 hours as needed for nausea or vomiting. 01/04/21  Yes Owens Shark, NP  saccharomyces boulardii (FLORASTOR) 250 MG capsule Take 250 mg by mouth 2 (two) times daily.   Yes [provider]  Testosterone 1.62 % GEL Apply 2 Pumps topically daily. 03/28/22  Yes Owens Shark, NP     Family History  Problem Relation Age of Onset   Cancer Grandchild        lymphoma    Social History   Socioeconomic History   Marital status: Married    Spouse name: Not on file   Number of children: Not on file   Years of education: Not on file   Highest education level: Not on file  Occupational History   Not on file  Tobacco Use   Smoking status: Never   Smokeless tobacco: Never  Vaping Use   Vaping Use: Never used  Substance and Sexual Activity   Alcohol use: Not Currently   Drug use: Yes    Types: Marijuana    Comment: edibles with THC   Sexual activity: Not Currently  Other Topics Concern   Not on file  Social History Narrative   Not on file   Social Determinants of Health   Financial Resource Strain: Not on file  Food Insecurity: No Food Insecurity (03/30/2022)   Hunger Vital Sign    Worried About Running Out of Food in the Last Year: Never true    Ran Out of Food in the Last Year: Never true  Transportation Needs: No Transportation Needs (03/30/2022)   PRAPARE - Hydrologist (Medical): No    Lack of Transportation (Non-Medical): No  Physical Activity: Not on file  Stress: Not on file  Social Connections: Not on  file    Review of Systems: unable to obtain  Vital Signs: BP (!) 138/92 (BP Location: Left Arm)   Pulse 92   Temp 98 F (36.7 C) (Oral)   Resp 20   Ht '5\' 11"'$  (1.803 m)   Wt 225 lb (102.1 kg)   SpO2 100%   BMI 31.38 kg/m   Physical Exam Vitals reviewed.  HENT:     Head: Normocephalic.     Mouth/Throat:     Mouth: Mucous membranes are dry.     Pharynx: Oropharynx is clear.  Cardiovascular:     Rate and Rhythm: Normal rate.  Pulmonary:     Effort: Pulmonary effort is normal. No  respiratory distress.  Abdominal:     General: Abdomen is flat.     Palpations: Abdomen is soft.  Skin:    Coloration: Skin is pale.  Neurological:     Mental Status: He is easily aroused.     Comments: Oriented to self.  Does not attempt to answer other questions or conversate in any way     Imaging: NM HEPATOBILIARY LEAK (POST-SURGICAL)  Result Date: 03/30/2022 CLINICAL DATA:  Postop cholecystectomy.  Abdominal pain and sepsis. EXAM: NUCLEAR MEDICINE HEPATOBILIARY IMAGING TECHNIQUE: Sequential images of the abdomen were obtained out to 60 minutes following intravenous administration of radiopharmaceutical. RADIOPHARMACEUTICALS:  5.3 mCi Tc-31m Choletec IV COMPARISON:  CT AP 03/30/2022 FINDINGS: Dynamic imaging was performed for 1 hour. Patient was unable to tolerate an additional hour of imaging. During the first hour there is prompt uptake and biliary excretion of activity by the liver is seen. Biliary activity passes into small bowel, consistent with patent common bile duct. No signs of radiotracer extravasation or abnormal collection of radiotracer activity to suggest bile leak. IMPRESSION: 1. No abnormal extravasation of the radiopharmaceutical to suggest active bile leak. 2. Patent common bile duct with normal biliary to bowel activity. Electronically Signed   By: TKerby MoorsM.D.   On: 03/30/2022 16:59   CT CHEST ABDOMEN PELVIS WO CONTRAST  Result Date: 03/30/2022 CLINICAL DATA:  Sepsis.  Renal cell carcinoma. Lumbar metastasis. * Tracking Code: BO * EXAM: CT CHEST, ABDOMEN AND PELVIS WITHOUT CONTRAST TECHNIQUE: Multidetector CT imaging of the chest, abdomen and pelvis was performed following the standard protocol without IV contrast. RADIATION DOSE REDUCTION: This exam was performed according to the departmental dose-optimization program which includes automated exposure control, adjustment of the mA and/or kV according to patient size and/or use of iterative reconstruction technique. COMPARISON:  None Available. FINDINGS: CT CHEST FINDINGS Cardiovascular: Coronary artery calcification and aortic atherosclerotic calcification. Port in the anterior chest wall with tip in distal SVC. Mediastinum/Nodes: No axillary or supraclavicular adenopathy. No mediastinal or hilar adenopathy. No pericardial fluid. Esophagus normal. Lungs/Pleura: New nodule in the medial RIGHT lung apex measuring 8 mm (49/8) new nodule in the inferior LEFT lower lobe measuring 5 mm (is 112/8) New nodule along the LEFT oblique fissure medially measures 5 mm (60/8) Musculoskeletal: Again demonstrated expansile lytic lesion in the medial RIGHT second rib (image 35/8. Similar pattern in the medial eleventh rib. Metastatic pattern within the first vertebral body additionally. No new lesions are present. CT ABDOMEN AND PELVIS FINDINGS Hepatobiliary: No focal lesion on noncontrast exam. Postcholecystectomy. There is small a collection the gallbladder fossa (image 63/2). This collection measures 21 mm x 18 mm. Pancreas: Pancreas is normal. Spleen: Normal spleen Adrenals/urinary tract: Adrenal glands and kidneys are normal. The ureters and bladder normal. Stomach/Bowel: Stomach, small bowel, appendix, and cecum are normal. The colon and rectosigmoid colon are normal. Vascular/Lymphatic: Abdominal aorta is normal caliber. There is no retroperitoneal or periportal lymphadenopathy. No pelvic lymphadenopathy. Reproductive: Other: No free fluid.  Musculoskeletal: The metastatic lesion in the LEFT acetabulum appears increased in volume measuring 4.8 x 3.3 cm compared to 3.42.5 cm. This lesion is lytic with soft tissue expansion in the operator space. Stable L4 lumbar metastasis. IMPRESSION: CHEST IMPRESSION: 1. 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 2. No pulmonary infection 3. Stable skeletal metastasis. PELVIS IMPRESSION: 1. Small gas collection in the gallbladder fossa following cholecystectomy. Potential abscess formation. 2. Expansion of LEFT acetabular metastasis into the operator fossa. 3.  Aortic Atherosclerosis (ICD10-I70.0).  Electronically Signed   By: Suzy Bouchard M.D.   On: 03/30/2022 12:01   DG Chest Portable 1 View  Result Date: 03/30/2022 CLINICAL DATA:  Shortness of breath EXAM: PORTABLE CHEST 1 VIEW COMPARISON:  01/08/2022 FINDINGS: Right chest port with catheter tip at the superior cavoatrial junction. Somewhat low lung volumes. Cardiac and mediastinal contours are within normal limits. No focal pulmonary opacity. No pleural effusion or pneumothorax. No acute osseous abnormality. IMPRESSION: No acute cardiopulmonary process. Electronically Signed   By: Merilyn Baba M.D.   On: 03/30/2022 11:49   MR Brain W Wo Contrast  Result Date: 03/28/2022 CLINICAL DATA:  Brain/CNS neoplasm, assess treatment response. History of renal cell carcinoma. EXAM: MRI HEAD WITHOUT AND WITH CONTRAST TECHNIQUE: Multiplanar, multiecho pulse sequences of the brain and surrounding structures were obtained without and with intravenous contrast. CONTRAST:  10 mL Vueway Contrast was administered via a port which was accessed by a Equities trader. COMPARISON:  Head CT 01/08/2022 and MRI 01/02/2022 FINDINGS: Brain: New lesions: None. Larger lesions: 1. Minimally increased size of a 3 mm enhancing lesion superiorly in the right cerebellar hemisphere (series 14, image 48, previously 2 mm). 2. Increased size of the heterogeneously  enhancing mass in the parasagittal left frontal lobe measuring 3.0 x 2.2 cm (series 17, image 23 and series 14, image 130), including enlargement of the more solidly enhancing anterior component noted on the prior study as well as new adjacent gyriform enhancement extending posteriorly and laterally. Mild worsening of moderate edema in the left frontal white matter without significant mass effect. Stable or smaller lesions: 1. 9 mm lesion laterally in the right occipital lobe, unchanged (series 14, image 69). 2. 3 mm right parietal cortical lesion, unchanged (series 14, image 101). 3. 4 mm right parietal cortical lesion (new on the prior MRI), unchanged (series 14, image 111). 4. 3 mm cortical lesion in the right superior frontal gyrus, unchanged (series 14, image 141). 5. 10 mm lesion laterally in the left occipital lobe, unchanged (series 14, image 52). 6. Irregular 8 mm left parietal lesion, stable to slightly smaller (series 14, image 105). 7. 5 mm left parietal lesion, unchanged (series 14, image 112). 8. 10 mm lesion posteriorly in the left superior frontal gyrus, unchanged (series 14, image 149). 9. Two small foci of enhancement measuring up to 3 mm in the inferior left temporal lobe, not significantly changed (series 16, image 30). Other brain findings: There is unchanged mild edema or gliosis in the left temporal and lateral left occipital lobes. There are chronic blood products associated with multiple treated metastases. No acute infarct, midline shift, or extra-axial fluid collection is evident. There is a background of mild chronic small vessel ischemic disease in the cerebral white matter. 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 A chronic lacunar infarct at the posterior aspect of the right putamen/external capsule is also unchanged. There is mild cerebral atrophy. Vascular:  Major intracranial vascular flow voids are preserved. Skull and upper cervical spine: No suspicious marrow lesion. Sinuses/Orbits: Unremarkable orbits. Trace mucosal thickening in the ethmoid sinuses. Clear mastoid air cells. Other: None. IMPRESSION: 1. Increased size of a 3 cm left frontal lobe mass. While new gyriform enhancement in this region may be treatment related, the more solidly enhancing anterior portion of the mass has also enlarged which could reflect a component of tumor progression. 2. Minimally increased size of a 3  mm right cerebellar lesion. 3. Unchanged or slightly decreased size of other metastases. 4. No evidence of new intracranial metastases. Electronically Signed   By: Logan Bores M.D.   On: 03/28/2022 11:11    Labs:  CBC: Recent Labs    03/29/22 1031 03/30/22 1138 03/31/22 0417 04/01/22 0322  WBC 16.4* 13.8* 20.8* 24.8*  HGB 9.6* 9.6* 10.0* 9.0*  HCT 31.8* 33.7* 34.1* 30.4*  PLT 623* 530* 645* 503*    COAGS: Recent Labs    01/08/22 1522 01/09/22 0333 03/30/22 1138 03/31/22 0417  INR 1.3* 1.3* 1.3* 1.3*  APTT  --   --  35  --     BMP: Recent Labs    03/29/22 1031 03/30/22 1138 03/31/22 0417 04/01/22 0322  NA 137 137 136 139  K 4.4 4.2 4.5 4.3  CL 100 99 105 107  CO2 29 27 19* 19*  GLUCOSE 170* 115* 222* 166*  BUN 16 12 26* 36*  CALCIUM 11.8* 10.6* 10.1 9.1  CREATININE 0.56* 0.71 1.27* 1.27*  GFRNONAA >60 >60 58* 58*    LIVER FUNCTION TESTS: Recent Labs    03/29/22 1031 03/30/22 1138 03/31/22 0417 04/01/22 0322  BILITOT 0.6 0.7 0.6 0.2*  AST 38 47* 36 16  ALT 107* 86* 77* 48*  ALKPHOS 188* 172* 182* 125  PROT 6.9 6.9 6.7 5.9*  ALBUMIN 3.3* 2.6* 2.5* 2.3*    Assessment and Plan:  Leukocytosis --17 days s/p cholecystectomy --mild LFTs, fever --very small collection in the gallbladder fossa, Per Dr. Maryelizabeth Kaufmann, not enough for a drain, but can attempt aspiration for fluid analysis --Pt to be NPO at midnight.  He is not on any blood  thinners. --Wife to determine if she would like code status to remain in effect during procedure.  She will let the RN know.   Thank you for this interesting consult.  I greatly enjoyed meeting Phillip [name] and look forward to participating in their care.  A copy of this report was sent to the requesting provider on this date.  Electronically Signed: Pasty Spillers, PA 04/01/2022, 11:42 AM   I spent a total of 20 Minutes in face to face in clinical consultation, greater than 50% of which was counseling/coordinating care for gallbladder fossa collection.

## 2022-04-01 NOTE — Progress Notes (Signed)
IP PROGRESS NOTE  Subjective:   Mr. Bruins became very lethargic yesterday afternoon.  He is more alert this morning.  His wife is at the bedside.  Urinary retention was relieved with placement of a Foley catheter.    Objective: Vital signs in last 24 hours: Blood pressure (!) 138/92, pulse 92, temperature 98 F (36.7 C), temperature source Oral, resp. rate 20, height '5\' 11"'$  (1.803 m), weight 225 lb (102.1 kg), SpO2 100 %.  Intake/Output from previous day: 02/03 0701 - 02/04 0700 In: 673 [I.V.:120; IV Piggyback:553] Out: 1950 [Urine:1950]  Physical Exam:  HEENT: The tongue is dry,, no thrush Lungs: Lear anteriorly, no respiratory distress Cardiac: Regular rate and rhythm Abdomen: Mildly distended, healed surgical incisions, no mass, right lower abdomen Extremities: No leg edema Neurologic: Oriented, arousable, moves all extremities to command, not talking Skin: Mild confluent erythema at the anterior chest and abdomen  Portacath/PICC-without erythema  Lab Results: Recent Labs    03/31/22 0417 04/01/22 0322  WBC 20.8* 24.8*  HGB 10.0* 9.0*  HCT 34.1* 30.4*  PLT 645* 503*    BMET Recent Labs    03/31/22 0417 04/01/22 0322  NA 136 139  K 4.5 4.3  CL 105 107  CO2 19* 19*  GLUCOSE 222* 166*  BUN 26* 36*  CREATININE 1.27* 1.27*  CALCIUM 10.1 9.1    Lab Results  Component Value Date   CEA1 1.77 01/04/2016    Studies/Results: NM HEPATOBILIARY LEAK (POST-SURGICAL)  Result Date: 03/30/2022 CLINICAL DATA:  Postop cholecystectomy.  Abdominal pain and sepsis. EXAM: NUCLEAR MEDICINE HEPATOBILIARY IMAGING TECHNIQUE: Sequential images of the abdomen were obtained out to 60 minutes following intravenous administration of radiopharmaceutical. RADIOPHARMACEUTICALS:  5.3 mCi Tc-18m Choletec IV COMPARISON:  CT AP 03/30/2022 FINDINGS: Dynamic imaging was performed for 1 hour. Patient was unable to tolerate an additional hour of imaging. During the first hour there is prompt  uptake and biliary excretion of activity by the liver is seen. Biliary activity passes into small bowel, consistent with patent common bile duct. No signs of radiotracer extravasation or abnormal collection of radiotracer activity to suggest bile leak. IMPRESSION: 1. No abnormal extravasation of the radiopharmaceutical to suggest active bile leak. 2. Patent common bile duct with normal biliary to bowel activity. Electronically Signed   By: TKerby MoorsM.D.   On: 03/30/2022 16:59   CT CHEST ABDOMEN PELVIS WO CONTRAST  Result Date: 03/30/2022 CLINICAL DATA:  Sepsis. Renal cell carcinoma. Lumbar metastasis. * Tracking Code: BO * EXAM: CT CHEST, ABDOMEN AND PELVIS WITHOUT CONTRAST TECHNIQUE: Multidetector CT imaging of the chest, abdomen and pelvis was performed following the standard protocol without IV contrast. RADIATION DOSE REDUCTION: This exam was performed according to the departmental dose-optimization program which includes automated exposure control, adjustment of the mA and/or kV according to patient size and/or use of iterative reconstruction technique. COMPARISON:  None Available. FINDINGS: CT CHEST FINDINGS Cardiovascular: Coronary artery calcification and aortic atherosclerotic calcification. Port in the anterior chest wall with tip in distal SVC. Mediastinum/Nodes: No axillary or supraclavicular adenopathy. No mediastinal or hilar adenopathy. No pericardial fluid. Esophagus normal. Lungs/Pleura: New nodule in the medial RIGHT lung apex measuring 8 mm (49/8) new nodule in the inferior LEFT lower lobe measuring 5 mm (is 112/8) New nodule along the LEFT oblique fissure medially measures 5 mm (60/8) Musculoskeletal: Again demonstrated expansile lytic lesion in the medial RIGHT second rib (image 35/8. Similar pattern in the medial eleventh rib. Metastatic pattern within the first vertebral body additionally.  No new lesions are present. CT ABDOMEN AND PELVIS FINDINGS Hepatobiliary: No focal lesion on  noncontrast exam. Postcholecystectomy. There is small a collection the gallbladder fossa (image 63/2). This collection measures 21 mm x 18 mm. Pancreas: Pancreas is normal. Spleen: Normal spleen Adrenals/urinary tract: Adrenal glands and kidneys are normal. The ureters and bladder normal. Stomach/Bowel: Stomach, small bowel, appendix, and cecum are normal. The colon and rectosigmoid colon are normal. Vascular/Lymphatic: Abdominal aorta is normal caliber. There is no retroperitoneal or periportal lymphadenopathy. No pelvic lymphadenopathy. Reproductive: Other: No free fluid. Musculoskeletal: The metastatic lesion in the LEFT acetabulum appears increased in volume measuring 4.8 x 3.3 cm compared to 3.42.5 cm. This lesion is lytic with soft tissue expansion in the operator space. Stable L4 lumbar metastasis. IMPRESSION: CHEST IMPRESSION: 1. 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 2. No pulmonary infection 3. Stable skeletal metastasis. PELVIS IMPRESSION: 1. Small gas collection in the gallbladder fossa following cholecystectomy. Potential abscess formation. 2. Expansion of LEFT acetabular metastasis into the operator fossa. 3.  Aortic Atherosclerosis (ICD10-I70.0). Electronically Signed   By: Suzy Bouchard M.D.   On: 03/30/2022 12:01   DG Chest Portable 1 View  Result Date: 03/30/2022 CLINICAL DATA:  Shortness of breath EXAM: PORTABLE CHEST 1 VIEW COMPARISON:  01/08/2022 FINDINGS: Right chest port with catheter tip at the superior cavoatrial junction. Somewhat low lung volumes. Cardiac and mediastinal contours are within normal limits. No focal pulmonary opacity. No pleural effusion or pneumothorax. No acute osseous abnormality. IMPRESSION: No acute cardiopulmonary process. Electronically Signed   By: Merilyn Baba M.D.   On: 03/30/2022 11:49    Medications: I have reviewed the patient's current medications.  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, L4 nerve compression Biopsy of the L4 mass 11/18/2015 confirmed metastatic renal cell carcinoma, clear cell type CTs of the chest, abdomen, and pelvis 11/18/2015-right lower lobe nodule, expansile lytic lesion at the right 11th rib/costal vertebral junction, left renal mass, expansile lesion involving the L4 vertebra, lytic lesion at the left acetabulum, and a low-attenuation liver lesion Initiation of SRS to L4 12/02/2015, Completed 12/12/2015 Initiation of Pazopanib 12/30/2015 Pazopanib placed on hold 02/06/2016 secondary to elevated liver enzymes Pazopanib resumed 03/07/2016 at a dose of 400 mg daily  Pazopanib discontinued 03/19/2016 secondary to elevated liver enzymes Restaging CTs 04/02/2016-stable left renal mass, decreased soft tissue component associated with the L4 metastasis, increased soft tissue component associated with the right 11th rib metastasis with increased T11 bony destruction, increased sclerosis at the left acetabulum lesion Cycle 1 nivolumab 04/12/2016 Cycle 2 nivolumab 04/26/2016 Cycle 3 nivolumab 05/11/2016 Cycle 4 nivolumab 05/24/2016 Cycle 5 nivolumab 06/08/2016 MRI lumbar spine 06/21/2016-unchanged tumor at L3, increased size of retroperitoneal lymph nodes compared to a CT from 04/02/2016 Cycle 6 nivolumab  06/22/2016 CTs chest, abdomen, and pelvis 07/04/2016-enlargement of the left renal mass, right adrenal nodule, left hilar and peritoneal lymph nodes, enlargement of left acetabular lesion. Stable lung nodules. Cycle 7 nivolumab 07/06/2016 Cycle 8 nivolumab 07/20/2016 Cycle 9 nivolumab 08/02/2016 Cycle 10 nivolumab 08/20/2016 Restaging CT 09/03/2016 evaluation with stable disease Cycle 11 nivolumab 09/05/2016 Cycle 12 nivolumab 09/19/2016 Cycle 13 nivolumab 10/03/2016 Cycle 14 nivolumab 10/17/2016 Cycle 15 nivolumab 10/31/2016 Cycle 16 nivolumab 11/14/2016 (changed to monthly schedule) Cycle 17 nivolumab 12/19/2016 CTs  01/21/2017-increased left renal mass, increased size of adrenal metastases, increased lytic bone lesions, increased left lung nodule, persistent tumor at L4 with probable epidural component Initiation of Cabozantinib 01/28/2017 Restaging CTs 05/30/2017- decreased size of  left hilar mass, left renal mass, retroperitoneal adenopathy, and adrenal metastasis.  Healing bone lesions. Cabozantinib continued CTs 10/21/2017- interval enlargement left hilar lymph node; stable rib lesions; stable mass left renal cortex; stable mildly nodular adrenal glands; stable lytic lesions within the pelvis and spine. Cabozantinib continued CTs 02/21/2018- enlargement of an AP window lymph node.  Mildly enlarged left hilar lymph node is unchanged.  Primary renal cell carcinoma involving the upper pole of the left kidney appears similar.  Stable enlarged right periaortic lymph node adjacent to the renal vessels.  Stable multifocal bony metastatic disease. Cabozantinib continued CTs 07/29/2018- stable 15 mm AP window nodes; stable left hilar node; subcarinal node slightly larger; right periaortic node 16 mm, previously 12 mm; portacaval node 24 mm, previously 16 mm; 15 mm node superior to the pancreatic head has enlarged; interval increase nodularity of both adrenal glands; stable left kidney mass; multifocal bony metastatic disease not significantly changed. Cabozantinib continued CTs 11/06/2018- moderate improvement in thoracic adenopathy; minimal improvement abdominal adenopathy; left kidney upper pole mass and various lytic expansile bone lesions stable; mild increase in nodularity of left adrenal gland; right adrenal gland nodularity stable. 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  Peripheral enhancing masses consistent with metastases. No hemorrhage. MRI brain 03/11/2019-7 enhancing brain masses consistent with  metastatic disease SRS to 7 brain lesions, treatment given 03/19/2019, 03/23/2019, and 03/25/2019 CTs 04/09/2019-decrease in left renal mass, bilateral adrenal nodules, AP window and porta hepatic adenopathy.  New 9 mm right lower lobe nodule.  Improved lytic lesion of the left acetabulum.  Other bone lesions are stable. Cabozantinib continued MRI brain 07/17/2019-resolution of 4 mm treated lesion in the right frontal cortex, 6 remaining treated lesions have decreased in size, new punctate metastasis in the superior right cerebellum SRS to right cerebellar lesion 07/30/2019 CTs 08/17/2019-previously noted right lower lobe nodule resolved, stable left kidney mass, mixed lytic/sclerotic bone lesions in the thoracolumbar spine, right posterior ribs, and left acetabulum-unchanged, no evidence of progressive disease  Cabozantinib continued MRI brain 10/23/2019-stable to slight decrease in size of multiple enhancing intracranial lesions.  Slight increase in surrounding edema in the medial left frontal lobe.  No new lesions present. MRI brain 01/29/2020-multiple enhancing brain lesions, some hemorrhagic, some with mild enlargement-treatment effect? CTs 02/08/2020-no thoracic metastases, enlargement of the left renal mass, enlargement of lytic lesion at T9, other lytic lesions unchanged MRI brain 05/04/2020-no new lesions, slight enlargement of a right occipital lesion, other lesions are stable or decreased in size CTs 08/11/2020-enlargement of left kidney mass, new 1.6 cm segment 7 liver lesion, enlargement of a lytic lesion at T9, increased lysis of a sclerotic lesion at the right second rib, 0.7 cm endoluminal nodule at the bladder dome-enlarged Brain MRI 08/10/2020-slight increase in a 15 mm left frontal lobe lesion, new punctate focus in the right occipital cortex, other lesions stable or slightly decreased CTs 11/01/2020-increase in left suprahilar opacity, enlargement of previous liver metastases, several new  subcentimeter lesions, increase in left renal mass, similar appearance of bone metastases, stable hyperdense/enhancing nodule in the left bladder Brain MRI 11/04/2020-no change in brain metastases Lenvatinib/pembrolizumab 11/30/2020 CTs 03/03/2021-stable left hilar lymph nodes; resolution of left upper lobe perihilar nodularity; unchanged for millimeter peripheral right upper lobe nodule; multiple lytic bone lesions appear stable; left kidney lesion decreased in size; bilateral adrenal nodules decreased in size; several liver lesion showed mild increase in size; several new small lesions within the right  hepatic lobe. Lenvatinib/pembrolizumab continued 03/07/2021 Brain MRI 03/10/2021-progression of a left frontal metastasis, tiny new metastasis in the right frontal lobe-referred for SBRT to the right frontal lesion and to [facility] to consider a LITT procedure for the progressive left frontal lesion SRS 04/06/2021, LITT procedure at [facility] 04/18/2021-pathology metastatic renal cell carcinoma in background of necrosis/gliosis Lenvatinib on hold Pembrolizumab held 04/24/2021 Pembrolizumab 05/01/2021, lenvatinib remains on hold pending surgical evaluation Pembrolizumab 05/25/2021, lenvatinib resumed Pembrolizumab 06/15/2021 Lenvima resumed 06/21/2021 CTs 07/28/2021-continued regression left renal lesion.  Much improved appearance of the liver.  Continued regression of bilateral adrenal gland nodules.  Stable mixed lytic and sclerotic metastatic bone disease.  No new or progressive findings. Pembrolizumab 07/31/2021, lenvatinib continued Pembrolizumab held 08/21/2021 due to generalized weakness Pembrolizumab resumed 09/11/2021 MRI brain 10/19/2021-"new "focus of minimal enhancement of the right frontal lobe, larger punctate right cerebellar lesion, review of MRI found the right frontal lesion was present in 2021 and was treated with SRS CTs 11/12/2021-2 new right upper lung nodules, stable left renal lesion, enlarging  lytic lesion in the left fourth rib Pembrolizumab and lenvatinib continued Brain 01/02/2022-new 4 mm lesion in the right parietal cortex the largest lesion in left frontal lobe stable in size with more solid  CTs chest, abdomen, and pelvis 01/08/2022-increased soft tissue component of a lytic left acetabular  metastasis and increased sclerotic component of the left fourth rib metastasis, decreased conspicuity of right upper lobe nodules, no change in renal mass Pembrolizumab and lenvatinib continued MRI brain 03/27/2022-minimal increase size of a 3 mm right cerebellar lesion, increased size of enhancing mass at the left frontal lobe measuring 3 x 2.2 cm with adjacent gyriform enhancement and mild worsening of edema in the left frontal white matter CTs 03/30/2022-new pulmonary nodules, small gas collection in the gallbladder fossa, increased left acetabular metastasis   Pain secondary to #1-managed by Dr. Lovenia Shuck.  Improved Hypertension Elevated transaminases 02/06/2016- Pazopanib placed on hold Liver enzymes normal 03/07/2016 Port-A-Cath placement 05/16/2016 Malaise/anorexia 09/05/2016. Cortisol and testosterone levels low. Hydrocortisone and testosterone replacement initiated. Conjunctival/scleral erythema 09/19/2016-resolved with steroid eyedrops Proximal right leg weakness. 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 Hypercalcemia status post Zometa 01/23/2017-resolved Hypercalcemia 03/29/2022-Zometa Pruritic rash following IV contrast 10/21/2017; rash following IV contrast 02/21/2018 despite prednisone/Benadryl premedication Brief episodes of expressive aphasia October and December 2020 Vitamin B12 deficiency confirmed on lab 03/07/2021-started oral vitamin B12 replacement 03/28/2021 Admission 01/08/2022 with acute cholecystitis/sepsis, cholecystostomy tube Cholecystectomy 03/15/2022 14.  Admission 03/30/2022 with failure to thrive and fever 15.  Hematuria with urinary  retention-likely secondary to bleeding from the renal mass   Mr Ruelas has metastatic renal cell carcinoma.  A brain MRI this week confirmed increase in the size of a dominant left frontal lobe metastasis with mild worsening of edema.  He had hypercalcemia and was treated with Zometa on 03/29/2022.  Lenvatinib and pembrolizumab were discontinued.   He was admitted with a fever and failure to thrive.  There is no clear source for infection.  The fever has resolved.  Cultures are negative to date.  A biliary scan was negative for a bile leak.  Hypercalcemia is improved following Zometa and IV fluids.  The white count is still elevated.  His overall status appears partially improved this morning.  He may be developing a drug rash.  I discussed his current status with his wife at the bedside.  She understands the poor prognosis.  Recommendations: Continue broad-spectrum intravenous antibiotics, follow-up cultures Continue Decadron Continue  intravenous fluids, advance diet OxyContin/oxycodone for pain Consider discontinuing vancomycin secondary to the rash     LOS: 2 days   Betsy Coder, MD   04/01/2022, 7:59 AM

## 2022-04-01 NOTE — Evaluation (Signed)
Physical Therapy Evaluation Patient Details Name: Phillip [name] MRN: [id number] DOB: March 10, 1945 Today's Date: 04/01/2022  History of Present Illness  Pt is a 77 year old male admitted for Sepsis secondary to biliary infection and with history of S4 RCC w/ liver, pulm and brain mets.  PMHx including but not limited to anemia, CKD, renal CA, HTN, seizure, cholecystitis in 11/23 with cholecystostomy tube placed - and lap cholecystectomy 03/15/22.  A brain MRI this week confirmed increase in the size of a dominant left frontal lobe metastasis with mild worsening of edema.  Clinical Impression  Pt admitted with above diagnosis.  Pt currently with functional limitations due to the deficits listed below (see PT Problem List). Pt will benefit from skilled PT to increase their independence and safety with mobility to allow discharge to the venue listed below.   Pt with recent admission 2 weeks ago and seen by PT 1/19.  Pt able to ambulate short distance at that time and discharged home with HHPT.  Pt not assisting with movement of extremities and unable to perform bed mobility at this time due to weakness and/or cognition.  No family present at time of evaluation.  Due to decreased cognition and increased assist level, would recommend SNF upon d/c at this time pending plan of care and decision of pt/family.        Recommendations for follow up therapy are one component of a multi-disciplinary discharge planning process, led by the attending physician.  Recommendations may be updated based on patient status, additional functional criteria and insurance authorization.  Follow Up Recommendations Skilled nursing-short term rehab (<3 hours/day) Can patient physically be transported by private vehicle: No    Assistance Recommended at Discharge Frequent or constant Supervision/Assistance  Patient can return home with the following  Two people to help with walking and/or transfers;Two people to help with  bathing/dressing/bathroom;Help with stairs or ramp for entrance;Assistance with cooking/housework;Assist for transportation;Assistance with feeding;Direct supervision/assist for medications management    Equipment Recommendations None recommended by PT (defer to next venue)  Recommendations for Other Services       Functional Status Assessment Patient has had a recent decline in their functional status and demonstrates the ability to make significant improvements in function in a reasonable and predictable amount of time.     Precautions / Restrictions Precautions Precautions: Fall      Mobility  Bed Mobility Overal bed mobility: Needs Assistance             General bed mobility comments: would be total assist, attempted with verbal and tactile cues however pt not initiating or assisting    Transfers                        Ambulation/Gait                  Stairs            Wheelchair Mobility    Modified Rankin (Stroke Patients Only)       Balance                                             Pertinent Vitals/Pain Pain Assessment Pain Assessment: Faces Faces Pain Scale: No hurt    Home Living Family/patient expects to be discharged to:: Private residence Living Arrangements: Spouse/significant other Available Help at Discharge: Family  Type of Home: House Home Access: Ramped entrance       Home Layout: One level Home Equipment: Advice worker (2 wheels);Shower seat - built in;Grab bars - toilet;Grab bars - tub/shower;BSC/3in1;Cane - single point Additional Comments: home set up and prior function per pt's last admission 2 weeks ago    Prior Function Prior Level of Function : Needs assist             Mobility Comments: R leg pain and weaker than L at baseline; Normally uses RW; can do short community gait; reports slow cautious speed; no falls; uses lift chair to stand but can stand from chair if  has good armrest; sleeps in recliner ADLs Comments: Since November spouse assist minimally with showers and dressing; can do toileting on his own; spouse does IADLs     Hand Dominance        Extremity/Trunk Assessment        Lower Extremity Assessment Lower Extremity Assessment: Generalized weakness;Difficult to assess due to impaired cognition (requiring assist for all extremities, ?cognition and/or weakness)       Communication   Communication: Expressive difficulties;Receptive difficulties (nonverbal during session, cognition likely affecting communication)  Cognition Arousal/Alertness: Awake/alert Behavior During Therapy: Flat affect Overall Cognitive Status: No family/caregiver present to determine baseline cognitive functioning                               Problem Solving: Slow processing General Comments: slow to respond, able to follow simple commands like "squeeze my hand" and "move your ankles" with verbal and tactile cues; only responding yes to questions, nonverbal at this time; no family present        General Comments      Exercises     Assessment/Plan    PT Assessment Patient needs continued PT services  PT Problem List Decreased strength;Decreased activity tolerance;Decreased knowledge of use of DME;Decreased balance;Decreased safety awareness;Decreased mobility;Decreased knowledge of precautions;Decreased cognition       PT Treatment Interventions DME instruction;Therapeutic exercise;Gait training;Balance training;Neuromuscular re-education;Functional mobility training;Therapeutic activities;Patient/family education;Wheelchair mobility training    PT Goals (Current goals can be found in the Care Plan section)  Acute Rehab PT Goals PT Goal Formulation: Patient unable to participate in goal setting Time For Goal Achievement: 04/15/22 Potential to Achieve Goals: Fair    Frequency Min 2X/week     Co-evaluation                AM-PAC PT "6 Clicks" Mobility  Outcome Measure Help needed turning from your back to your side while in a flat bed without using bedrails?: Total Help needed moving from lying on your back to sitting on the side of a flat bed without using bedrails?: Total Help needed moving to and from a bed to a chair (including a wheelchair)?: Total Help needed standing up from a chair using your arms (e.g., wheelchair or bedside chair)?: Total Help needed to walk in hospital room?: Total Help needed climbing 3-5 steps with a railing? : Total 6 Click Score: 6    End of Session   Activity Tolerance: Patient limited by lethargy;Patient limited by fatigue Patient left: in bed;with call bell/phone within reach Nurse Communication: Mobility status PT Visit Diagnosis: Other abnormalities of gait and mobility (R26.89);Muscle weakness (generalized) (M62.81)    Time: 3710-6269 PT Time Calculation (min) (ACUTE ONLY): 12 min   Charges:   PT Evaluation $PT Eval Low Complexity: 1 Low  Arlyce Dice, DPT Physical Therapist Acute Rehabilitation Services Preferred contact method: Secure Chat Weekend Pager Only: [phone] Office: [place] 04/01/2022, 11:44 AM

## 2022-04-01 NOTE — Progress Notes (Signed)
Subjective/Chief Complaint: Feels about the same Foley had to be placed for retention yesterday   Objective: Vital signs in last 24 hours: Temp:  [98 F (36.7 C)-98.5 F (36.9 C)] 98 F (36.7 C) (02/04 0359) Pulse Rate:  [91-95] 92 (02/04 0359) Resp:  [20-21] 20 (02/04 0359) BP: (138-153)/(80-92) 138/92 (02/04 0359) SpO2:  [98 %-100 %] 100 % (02/04 0359) Last BM Date : 04/01/22  Intake/Output from previous day: 02/03 0701 - 02/04 0700 In: 673 [I.V.:120; IV Piggyback:553] Out: 1950 [Urine:1950] Intake/Output this shift: No intake/output data recorded.  Exam: Awake but somnolent Abdomen with mild diffuse tenderness, no frank peritonitis  Lab Results:  Recent Labs    03/31/22 0417 04/01/22 0322  WBC 20.8* 24.8*  HGB 10.0* 9.0*  HCT 34.1* 30.4*  PLT 645* 503*   BMET Recent Labs    03/31/22 0417 04/01/22 0322  NA 136 139  K 4.5 4.3  CL 105 107  CO2 19* 19*  GLUCOSE 222* 166*  BUN 26* 36*  CREATININE 1.27* 1.27*  CALCIUM 10.1 9.1   PT/INR Recent Labs    03/30/22 1138 03/31/22 0417  LABPROT 15.7* 15.8*  INR 1.3* 1.3*   ABG No results for input(s): "PHART", "HCO3" in the last 72 hours.  Invalid input(s): "PCO2", "PO2"  Studies/Results: NM HEPATOBILIARY LEAK (POST-SURGICAL)  Result Date: 03/30/2022 CLINICAL DATA:  Postop cholecystectomy.  Abdominal pain and sepsis. EXAM: NUCLEAR MEDICINE HEPATOBILIARY IMAGING TECHNIQUE: Sequential images of the abdomen were obtained out to 60 minutes following intravenous administration of radiopharmaceutical. RADIOPHARMACEUTICALS:  5.3 mCi Tc-12m Choletec IV COMPARISON:  CT AP 03/30/2022 FINDINGS: Dynamic imaging was performed for 1 hour. Patient was unable to tolerate an additional hour of imaging. During the first hour there is prompt uptake and biliary excretion of activity by the liver is seen. Biliary activity passes into small bowel, consistent with patent common bile duct. No signs of radiotracer extravasation  or abnormal collection of radiotracer activity to suggest bile leak. IMPRESSION: 1. No abnormal extravasation of the radiopharmaceutical to suggest active bile leak. 2. Patent common bile duct with normal biliary to bowel activity. Electronically Signed   By: TKerby MoorsM.D.   On: 03/30/2022 16:59   CT CHEST ABDOMEN PELVIS WO CONTRAST  Result Date: 03/30/2022 CLINICAL DATA:  Sepsis. Renal cell carcinoma. Lumbar metastasis. * Tracking Code: BO * EXAM: CT CHEST, ABDOMEN AND PELVIS WITHOUT CONTRAST TECHNIQUE: Multidetector CT imaging of the chest, abdomen and pelvis was performed following the standard protocol without IV contrast. RADIATION DOSE REDUCTION: This exam was performed according to the departmental dose-optimization program which includes automated exposure control, adjustment of the mA and/or kV according to patient size and/or use of iterative reconstruction technique. COMPARISON:  None Available. FINDINGS: CT CHEST FINDINGS Cardiovascular: Coronary artery calcification and aortic atherosclerotic calcification. Port in the anterior chest wall with tip in distal SVC. Mediastinum/Nodes: No axillary or supraclavicular adenopathy. No mediastinal or hilar adenopathy. No pericardial fluid. Esophagus normal. Lungs/Pleura: New nodule in the medial RIGHT lung apex measuring 8 mm (49/8) new nodule in the inferior LEFT lower lobe measuring 5 mm (is 112/8) New nodule along the LEFT oblique fissure medially measures 5 mm (60/8) Musculoskeletal: Again demonstrated expansile lytic lesion in the medial RIGHT second rib (image 35/8. Similar pattern in the medial eleventh rib. Metastatic pattern within the first vertebral body additionally. No new lesions are present. CT ABDOMEN AND PELVIS FINDINGS Hepatobiliary: No focal lesion on noncontrast exam. Postcholecystectomy. There is small a collection the gallbladder  fossa (image 63/2). This collection measures 21 mm x 18 mm. Pancreas: Pancreas is normal. Spleen: Normal  spleen Adrenals/urinary tract: Adrenal glands and kidneys are normal. The ureters and bladder normal. Stomach/Bowel: Stomach, small bowel, appendix, and cecum are normal. The colon and rectosigmoid colon are normal. Vascular/Lymphatic: Abdominal aorta is normal caliber. There is no retroperitoneal or periportal lymphadenopathy. No pelvic lymphadenopathy. Reproductive: Other: No free fluid. Musculoskeletal: The metastatic lesion in the LEFT acetabulum appears increased in volume measuring 4.8 x 3.3 cm compared to 3.42.5 cm. This lesion is lytic with soft tissue expansion in the operator space. Stable L4 lumbar metastasis. IMPRESSION: CHEST IMPRESSION: 1. 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 2. No pulmonary infection 3. Stable skeletal metastasis. PELVIS IMPRESSION: 1. Small gas collection in the gallbladder fossa following cholecystectomy. Potential abscess formation. 2. Expansion of LEFT acetabular metastasis into the operator fossa. 3.  Aortic Atherosclerosis (ICD10-I70.0). Electronically Signed   By: Suzy Bouchard M.D.   On: 03/30/2022 12:01   DG Chest Portable 1 View  Result Date: 03/30/2022 CLINICAL DATA:  Shortness of breath EXAM: PORTABLE CHEST 1 VIEW COMPARISON:  01/08/2022 FINDINGS: Right chest port with catheter tip at the superior cavoatrial junction. Somewhat low lung volumes. Cardiac and mediastinal contours are within normal limits. No focal pulmonary opacity. No pleural effusion or pneumothorax. No acute osseous abnormality. IMPRESSION: No acute cardiopulmonary process. Electronically Signed   By: Merilyn Baba M.D.   On: 03/30/2022 11:49    Anti-infectives: Anti-infectives (From admission, onward)    Start     Dose/Rate Route Frequency Ordered Stop   03/31/22 1600  vancomycin (VANCOREADY) IVPB 1250 mg/250 mL        1,250 mg 166.7 mL/hr over 90 Minutes Intravenous Every 24 hours 03/31/22 0843     03/31/22 1300  vancomycin (VANCOREADY) IVPB 1500 mg/300 mL   Status:  Discontinued        1,500 mg 150 mL/hr over 120 Minutes Intravenous Every 24 hours 03/30/22 1447 03/31/22 0843   03/30/22 2200  metroNIDAZOLE (FLAGYL) IVPB 500 mg  Status:  Discontinued        500 mg 100 mL/hr over 60 Minutes Intravenous Every 12 hours 03/30/22 1623 03/30/22 1630   03/30/22 2200  metroNIDAZOLE (FLAGYL) IVPB 500 mg        500 mg 100 mL/hr over 60 Minutes Intravenous Every 12 hours 03/30/22 1446     03/30/22 2100  ceFEPIme (MAXIPIME) 2 g in sodium chloride 0.9 % 100 mL IVPB        2 g 200 mL/hr over 30 Minutes Intravenous Every 8 hours 03/30/22 1440     03/30/22 1045  vancomycin (VANCOREADY) IVPB 2000 mg/400 mL        2,000 mg 200 mL/hr over 120 Minutes Intravenous  Once 03/30/22 1038 03/30/22 1529   03/30/22 1030  ceFEPIme (MAXIPIME) 2 g in sodium chloride 0.9 % 100 mL IVPB        2 g 200 mL/hr over 30 Minutes Intravenous  Once 03/30/22 1029 03/30/22 1250   03/30/22 1030  metroNIDAZOLE (FLAGYL) IVPB 500 mg        500 mg 100 mL/hr over 60 Minutes Intravenous  Once 03/30/22 1029 03/30/22 1400   03/30/22 1030  vancomycin (VANCOCIN) IVPB 1000 mg/200 mL premix  Status:  Discontinued        1,000 mg 200 mL/hr over 60 Minutes Intravenous  Once 03/30/22 1029 03/30/22 1038       Assessment/Plan: Metastatic renal cell cancer S/p  lap chole 1/18 for chronic cholecystitis and gallstones after previous percutaneous drain by IR Now with fever, increasing WBC  HIDA negative for bile leak Small collection in the GB fossa likely postop fluid and surgical snow placed in OR  Given increased WBC, will ask IR to see whether or not they can at least aspirate fluid there for diagnostic purposes.  Collection likely too small for a drain.  Will order a U/A as well  I discussed the situation with the patient's wife at bedside.  Unfortunately, this could be a result of advancement of his metastatic disease.   LOS: 2 days    Coralie Keens MD 04/01/2022

## 2022-04-01 NOTE — Evaluation (Signed)
Clinical/Bedside Swallow Evaluation Patient Details  Name: Phillip [name] MRN: [id number] Date of Birth: [birth year]  Today's Date: 04/01/2022 Time: SLP Start Time (ACUTE ONLY): 30 SLP Stop Time (ACUTE ONLY): 1608 SLP Time Calculation (min) (ACUTE ONLY): 18 min  Past Medical History:  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[place])    Chronic kidney disease    renal cancer   Constipation    Depression    Hypertension    Low testosterone in male    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   Seizures ([place])    last seizure 01/2019 - controlled since on keppra   Tachycardia    Wears glasses    Past Surgical History:  Past Surgical History:  Procedure Laterality Date   CHOLECYSTECTOMY N/A 03/15/2022   Procedure: LAPAROSCOPIC CHOLECYSTECTOMY;  Surgeon: Coralie Keens, MD;  Location: [facility];  Service: General;  Laterality: N/A;   insertion port-a-cath  2018   IR GENERIC HISTORICAL  11/18/2015   IR FLUORO GUIDED NEEDLE [place] ASPIRATION/INJECTION LOC 11/18/2015 [name], MD [facility]   IR GENERIC HISTORICAL  05/16/2016   IR FLUORO GUIDE PORT INSERTION RIGHT 05/16/2016 [name], MD [facility]INTER[facility] RAD   IR GENERIC HISTORICAL  05/16/2016   IR US GUIDE VASC ACCESS RIGHT 05/16/2016 [name], MD [facility]   IR PERC CHOLECYSTOSTOMY  01/09/2022   LITT procedure  2023   at [facility] 06/16/2019   Procedure: MRI BRAIN WITH AND WITHOUT CONTRAST WITH ANESTHESIA;  Surgeon: Radiologist, Medication, MD;  Location: [facility];  Service: Radiology;  Laterality: N/A;   HPI:  Pt is a 77 year old male admitted for sepsis secondary to biliary infection and with history of S4 RCC w/ liver, pulm and brain mets.  PMHx including but not limited to anemia, CKD, renal CA, HTN, seizure, cholecystitis in 11/23 with cholecystostomy tube placed - and lap cholecystectomy 03/15/22.  A brain MRI  confirmed increase  in the size of a dominant left frontal lobe metastasis with mild worsening of edema.    Assessment / Plan / Recommendation  Clinical Impression  Phillip [name] was alert; family was at bedside. He presented with no obvious focal CN deficits; there were delays in following commands and he was non-verbal throughout assessment. Edentulous.  Family reports good intake of clear liquids with no obvious swallowing difficulties. He demonstrated good oral awareness, acceptance of straws with sequential sips of liquids and no s/s of aspiration. There was adequate oral recognition and manipulation of purees with no oral residue post-swallow. Mastication of regular solids was notable for prolonged effort before material could be swallowed. Recommend advancing pt to full liquids for now. Mentation appears to be primary obstacle to diet advancement. Discussed with pt's wife at bedside - SLP will follow briefly for potential for diet advancement when appropriate. SLP Visit Diagnosis: Dysphagia, oral phase (R13.11)    Aspiration Risk    unknown   Diet Recommendation   Full liquids  Medication Administration: Whole meds with puree    Other  Recommendations Oral Care Recommendations: Oral care BID    Recommendations for follow up therapy are one component of a multi-disciplinary discharge planning process, led by the attending physician.  Recommendations may be updated based on patient status, additional functional criteria and insurance authorization.  Follow up Recommendations No SLP follow up  Frequency and Duration min 2x/week  1 week       Prognosis Prognosis for Safe Diet Advancement: Good Barriers to Reach Goals: Cognitive deficits      Swallow Study   General HPI: Pt is a 77 year old male admitted for sepsis secondary to biliary infection and with history of S4 RCC w/ liver, pulm and brain mets.  PMHx including but not limited to anemia, CKD, renal CA, HTN, seizure, cholecystitis in  11/23 with cholecystostomy tube placed - and lap cholecystectomy 03/15/22.  A brain MRI this week confirmed increase in the size of a dominant left frontal lobe metastasis with mild worsening of edema. Type of Study: Bedside Swallow Evaluation Previous Swallow Assessment: no Diet Prior to this Study: Other (Comment) (clear liquids) Temperature Spikes Noted: No Respiratory Status: Nasal cannula History of Recent Intubation: No Behavior/Cognition: Alert Oral Cavity Assessment: Within Functional Limits Oral Care Completed by SLP: Recent completion by staff Oral Cavity - Dentition: Edentulous Self-Feeding Abilities: Total assist Patient Positioning: Upright in bed Baseline Vocal Quality: Not observed Volitional Cough: Cognitively unable to elicit Volitional Swallow: Unable to elicit    Oral/Motor/Sensory Function Overall Oral Motor/Sensory Function: Other (comment) (symmetric at rest)   Ice Chips Ice chips: Not tested   Thin Liquid Thin Liquid: Within functional limits    Nectar Thick Nectar Thick Liquid: Not tested   Honey Thick Honey Thick Liquid: Not tested   Puree Puree: Within functional limits   Solid     Solid: Impaired Oral Phase Functional Implications: Prolonged oral transit      Juan Quam Laurice 04/01/2022,4:26 PM  Estill Bamberg L. Tivis Ringer, MA CCC/SLP Clinical Specialist - [place] Office number [phone]

## 2022-04-01 NOTE — Progress Notes (Addendum)
PROGRESS NOTE    Phillip [name]  OVF:[id number] DOB: [birth year] DOA: 03/30/2022 PCP: Charlane Ferretti, MD     Brief Narrative:   H/o  metastatic renal cell carcinoma with brain, lung and bone mets, H/o  seizure, h/o recent cholecystectomy a few weeks ago presenting with weakness,  Worsening confusion , hypercalcemia,  concerning for sepsis from biliary infection? But negative HIDA scan  On broad spec abx. Gen surg watching.  Had MRI Brain couple days ago showing increasing brain met size. Started on decadron then. Onco  following.    Subjective:  He appears is more awake this am, gross hematuria has almost resolved, 1.9liter urine output documented since foley placement yesterday around 2pm  No more fever  Cr 1.27, wbc 24.8    Assessment & Plan:  Principal Problem:   Sepsis ([place]) Active Problems:   Renal cell carcinoma ([name])   HTN (hypertension)   Seizure disorder ([name])   Hypercalcemia   Normocytic anemia    Assessment and Plan:    Presumed sepsis with fever, leukocytosis, unclear source, possible biliary infection -CT showed "1. Small gas collection in the gallbladder fossa following cholecystectomy. Potential abscess formation." -negative HIDA -seen by surgery "findings in the gallbladder fossa are minimal and has the appearance consistent with the hemostatic agent used at the time of surgery. The fluid there is likely too small for IR to drain or aspirate. 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 [name] who do not think CT of head will change medical management, case discussed with wife, decided to cancel CT head, continue current treatment with IV antibiotic and steroid, no escalation of care -he is more awake on 2/4 am, will start speech eval, aspiration precaution     Gross hematuria/urinary retention/AKI -RN report patient present with gross hematuria, overnight minimal urine output, Foley placed on 2/3 2pm with return of  600cc gross hematuria -Case discussed with oncology Dr. [name] who do not think urology consult will provide additional benefit as gross hematuria likely from renal cell carcinoma -Plan continue antibiotics, f/u on urine culture, continue abx -gross hematuria has almost resolved on 2/4  Hypocalcemia, status post Zometa, on IV fluids Calcium improved/normalized   Normocytic anemia in the setting of malignancy and intermittent hematuria -Hemoglobin 10  Leukocytosis Likely multifactorial including steroid, possible infection, dehydration  Thrombocytosis Likely reactive  Impaired fasting blood glucose A.m. blood glucose 222 Likely from steroid  S4 RCC w/bone,  liver, pulm and brain mets Seizure d/o     - he had a recent MRI brain w/ increasing brain met size; he was started on decadron by onco; will continue for now     - continue seizure PPx -appreciate oncology input    Body mass index is 31.38 kg/m..     I have Reviewed nursing notes, Vitals, pain scores, I/o's, Lab results and  imaging results since pt's last encounter, details please see discussion above  I ordered the following labs:  Unresulted Labs (From admission, onward)     Start  Ordered   04/02/22 0500  CBC with Differential/Platelet  Tomorrow morning,   R       Question:  Specimen collection method  Answer:  IV Team=IV Team collect   04/01/22 1108   04/02/22 0500  Comprehensive metabolic panel  Tomorrow morning,   R        Question:  Specimen collection method  Answer:  IV Team=IV Team collect   04/01/22 1108   04/02/22 0500  Magnesium  Tomorrow morning,   R       Question:  Specimen collection method  Answer:  IV Team=IV Team collect   04/01/22 1108   04/02/22 0500  Phosphorus  Tomorrow morning,   R       Question:  Specimen collection method  Answer:  IV Team=IV Team collect   04/01/22 1108   04/01/22 1108  Urinalysis, w/ Reflex to Culture (Infection Suspected) -Urine, Clean Catch  (Urine Labs)  Once,   R       Question:  Specimen Source  Answer:  Urine, Clean Catch   04/01/22 1107   04/01/22 0729  Urinalysis, Routine w reflex microscopic -Urine, Catheterized  Once,   R       Question Answer Comment  Specimen Source Urine, Catheterized   Release to patient Immediate      04/01/22 0730   03/31/22 0500  Creatinine, serum  Daily,   R     Comments: Can discontinue this lab order if vancomycin discontinued    03/30/22 1451             DVT prophylaxis: SCDs Start: 03/30/22 1624   Code Status:   Code Status: DNR  Family Communication: Wife at bedside on 2/3 Disposition:   Dispo: The patient is from: Home              Anticipated d/c is to: TBD              Anticipated d/c date is: TBD  Antimicrobials:    Anti-infectives (From admission, onward)    Start     Dose/Rate Route Frequency Ordered Stop   03/31/22 1600  vancomycin (VANCOREADY) IVPB 1250 mg/250 mL        1,250 mg 166.7 mL/hr over 90 Minutes Intravenous Every 24 hours 03/31/22 0843     03/31/22 1300  vancomycin (VANCOREADY) IVPB 1500 mg/300 mL  Status:  Discontinued        1,500 mg 150 mL/hr over 120 Minutes Intravenous Every 24 hours 03/30/22 1447 03/31/22 0843   03/30/22 2200  metroNIDAZOLE (FLAGYL) IVPB 500 mg  Status:  Discontinued        500 mg 100 mL/hr over 60 Minutes Intravenous Every 12 hours 03/30/22 1623 03/30/22 1630   03/30/22 2200  metroNIDAZOLE (FLAGYL) IVPB 500 mg        500 mg 100 mL/hr over 60 Minutes  Intravenous Every 12 hours 03/30/22 1446     03/30/22 2100  ceFEPIme (MAXIPIME) 2 g in sodium chloride 0.9 % 100 mL IVPB        2 g 200 mL/hr over 30 Minutes Intravenous Every 8 hours 03/30/22 1440     03/30/22 1045  vancomycin (VANCOREADY) IVPB 2000 mg/400 mL        2,000 mg 200 mL/hr over 120 Minutes Intravenous  Once 03/30/22 1038 03/30/22 1529   03/30/22 1030  ceFEPIme (MAXIPIME) 2 g in sodium chloride 0.9 % 100 mL IVPB        2 g 200 mL/hr over  30 Minutes Intravenous  Once 03/30/22 1029 03/30/22 1250   03/30/22 1030  metroNIDAZOLE (FLAGYL) IVPB 500 mg        500 mg 100 mL/hr over 60 Minutes Intravenous  Once 03/30/22 1029 03/30/22 1400   03/30/22 1030  vancomycin (VANCOCIN) IVPB 1000 mg/200 mL premix  Status:  Discontinued        1,000 mg 200 mL/hr over 60 Minutes Intravenous  Once 03/30/22 1029 03/30/22 1038          Objective: Vitals:   03/31/22 0717 03/31/22 1548 03/31/22 2109 04/01/22 0359  BP:  (!) 153/89 (!) 147/80 (!) 138/92  Pulse:  91 95 92  Resp: 20 20 (!) 21 20  Temp:  98 F (36.7 C) 98.5 F (36.9 C) 98 F (36.7 C)  TempSrc:  Oral Oral Oral  SpO2:  100% 98% 100%  Weight:      Height:        Intake/Output Summary (Last 24 hours) at 04/01/2022 1119 Last data filed at 04/01/2022 2355 Gross per 24 hour  Intake 673 ml  Output 1950 ml  Net -1277 ml   Filed Weights   03/30/22 1029  Weight: 102.1 kg    Examination:  General exam: more alert, open eyes spontaneously, attempted to follow command, but not able to do it, he is very weak  Respiratory system: Clear to auscultation.  Normal respiratory effort  Cardiovascular system:  RRR.  Gastrointestinal system: Abdomen is nondistended, soft and nontender.  Normal bowel sounds heard. Central nervous system:  more alert, open eyes spontaneously, attempted to follow command Extremities:  trace lower extremity  edema Skin: mild diffuse erythema on chest  Psychiatry: no agitation     Data Reviewed: I have  personally reviewed  labs and visualized  imaging studies since the last encounter and formulate the plan        Scheduled Meds:  Chlorhexidine Gluconate Cloth  6 each Topical Daily   dexamethasone (DECADRON) injection  4 mg Intravenous Daily   oxyCODONE  10-20 mg Oral Daily   oxyCODONE  20 mg Oral QHS   testosterone  5 g Transdermal Daily   Continuous Infusions:  sodium chloride 100 mL/hr at 04/01/22 0656   ceFEPime (MAXIPIME) IV 2 g (04/01/22 0557)   levETIRAcetam 250 mg (04/01/22 0602)   metronidazole 500 mg (04/01/22 0951)   vancomycin 1,250 mg (03/31/22 1557)     LOS: 2 days   Time spent: 10mns  FFlorencia Reasons MD PhD FACP [facility]  Available via Epic secure chat 7am-7pm for nonurgent issues Please page for urgent issues To page the attending provider between 7A-7P or the covering provider during after hours 7P-7A, please log into the web site [email or web address] and access using universal [place] password for that web site. If you do not have the password, please call the hospital operator.    04/01/2022, 11:19 AM

## 2022-04-02 ENCOUNTER — Inpatient Hospital Stay: Payer: PPO

## 2022-04-02 ENCOUNTER — Inpatient Hospital Stay: Payer: PPO | Admitting: Internal Medicine

## 2022-04-02 DIAGNOSIS — A419 Sepsis, unspecified organism: Secondary | ICD-10-CM | POA: Diagnosis not present

## 2022-04-02 DIAGNOSIS — R6520 Severe sepsis without septic shock: Secondary | ICD-10-CM | POA: Diagnosis not present

## 2022-04-02 DIAGNOSIS — G9341 Metabolic encephalopathy: Secondary | ICD-10-CM | POA: Diagnosis not present

## 2022-04-02 LAB — CBC WITH DIFFERENTIAL/PLATELET
Abs Immature Granulocytes: 0.08 10*3/uL — ABNORMAL HIGH (ref 0.00–0.07)
Basophils Absolute: 0 10*3/uL (ref 0.0–0.1)
Basophils Relative: 0 %
Eosinophils Absolute: 0.1 10*3/uL (ref 0.0–0.5)
Eosinophils Relative: 1 %
HCT: 26.8 % — ABNORMAL LOW (ref 39.0–52.0)
Hemoglobin: 7.8 g/dL — ABNORMAL LOW (ref 13.0–17.0)
Immature Granulocytes: 1 %
Lymphocytes Relative: 9 %
Lymphs Abs: 1.5 10*3/uL (ref 0.7–4.0)
MCH: 25.6 pg — ABNORMAL LOW (ref 26.0–34.0)
MCHC: 29.1 g/dL — ABNORMAL LOW (ref 30.0–36.0)
MCV: 87.9 fL (ref 80.0–100.0)
Monocytes Absolute: 1.5 10*3/uL — ABNORMAL HIGH (ref 0.1–1.0)
Monocytes Relative: 9 %
Neutro Abs: 12.6 10*3/uL — ABNORMAL HIGH (ref 1.7–7.7)
Neutrophils Relative %: 80 %
Platelets: 390 10*3/uL (ref 150–400)
RBC: 3.05 MIL/uL — ABNORMAL LOW (ref 4.22–5.81)
RDW: 18.4 % — ABNORMAL HIGH (ref 11.5–15.5)
WBC: 15.7 10*3/uL — ABNORMAL HIGH (ref 4.0–10.5)
nRBC: 0 % (ref 0.0–0.2)

## 2022-04-02 LAB — CBC
HCT: 26.5 % — ABNORMAL LOW (ref 39.0–52.0)
Hemoglobin: 7.7 g/dL — ABNORMAL LOW (ref 13.0–17.0)
MCH: 25.3 pg — ABNORMAL LOW (ref 26.0–34.0)
MCHC: 29.1 g/dL — ABNORMAL LOW (ref 30.0–36.0)
MCV: 87.2 fL (ref 80.0–100.0)
Platelets: 335 10*3/uL (ref 150–400)
RBC: 3.04 MIL/uL — ABNORMAL LOW (ref 4.22–5.81)
RDW: 18.6 % — ABNORMAL HIGH (ref 11.5–15.5)
WBC: 13.3 10*3/uL — ABNORMAL HIGH (ref 4.0–10.5)
nRBC: 0 % (ref 0.0–0.2)

## 2022-04-02 LAB — COMPREHENSIVE METABOLIC PANEL
ALT: 35 U/L (ref 0–44)
AST: 18 U/L (ref 15–41)
Albumin: 2 g/dL — ABNORMAL LOW (ref 3.5–5.0)
Alkaline Phosphatase: 110 U/L (ref 38–126)
Anion gap: 8 (ref 5–15)
BUN: 24 mg/dL — ABNORMAL HIGH (ref 8–23)
CO2: 20 mmol/L — ABNORMAL LOW (ref 22–32)
Calcium: 8.5 mg/dL — ABNORMAL LOW (ref 8.9–10.3)
Chloride: 111 mmol/L (ref 98–111)
Creatinine, Ser: 0.6 mg/dL — ABNORMAL LOW (ref 0.61–1.24)
GFR, Estimated: >60 mL/min (ref >60)
Glucose, Bld: 150 mg/dL — ABNORMAL HIGH (ref 70–99)
Potassium: 3.7 mmol/L (ref 3.5–5.1)
Sodium: 139 mmol/L (ref 135–145)
Total Bilirubin: 0.5 mg/dL (ref 0.3–1.2)
Total Protein: 5.6 g/dL — ABNORMAL LOW (ref 6.5–8.1)

## 2022-04-02 LAB — BASIC METABOLIC PANEL
Anion gap: 8 (ref 5–15)
BUN: 20 mg/dL (ref 8–23)
CO2: 20 mmol/L — ABNORMAL LOW (ref 22–32)
Calcium: 8.4 mg/dL — ABNORMAL LOW (ref 8.9–10.3)
Chloride: 113 mmol/L — ABNORMAL HIGH (ref 98–111)
Creatinine, Ser: 0.6 mg/dL — ABNORMAL LOW (ref 0.61–1.24)
GFR, Estimated: >60 mL/min (ref >60)
Glucose, Bld: 187 mg/dL — ABNORMAL HIGH (ref 70–99)
Potassium: 4 mmol/L (ref 3.5–5.1)
Sodium: 141 mmol/L (ref 135–145)

## 2022-04-02 LAB — MAGNESIUM: Magnesium: 2.2 mg/dL (ref 1.7–2.4)

## 2022-04-02 LAB — PHOSPHORUS: Phosphorus: 1.8 mg/dL — ABNORMAL LOW (ref 2.5–4.6)

## 2022-04-02 MED ORDER — ORAL CARE MOUTH RINSE
15.0000 mL | OROMUCOSAL | Status: DC
  Administered 2022-04-02 – 2022-04-13 (×40): 15 mL via OROMUCOSAL

## 2022-04-02 MED ORDER — SODIUM PHOSPHATES 45 MMOLE/15ML IV SOLN
15.0000 mmol | Freq: Once | INTRAVENOUS | Status: AC
  Administered 2022-04-02: 15 mmol via INTRAVENOUS
  Filled 2022-04-02: qty 5

## 2022-04-02 MED ORDER — ORAL CARE MOUTH RINSE: 15.0000 mL | OROMUCOSAL | Status: DC | PRN

## 2022-04-02 NOTE — Hospital Course (Signed)
H/o  metastatic renal cell carcinoma with brain, lung and bone mets, H/o  seizure, h/o recent cholecystectomy a few weeks ago presenting with weakness,  Worsening confusion , hypercalcemia,  concerning for sepsis from possible biliary infection. But negative HIDA scan  On broad spec abx. Gen surg and IR do not think biliary source.  Had MRI Brain couple days ago showing increasing brain met size. Started on decadron then. Onc following.   2/6: confusion seems improved, may need higher steroids for brain mets vs. Continued treatment of intra-abd source. UTI was on ddx but lower suspicion.

## 2022-04-02 NOTE — Progress Notes (Addendum)
Patient is alert with confusion, observed with (accessed) right chest port huber needle and IV access to left arm out and lying on patient left side, no bleeding noted. Safety mitts applied to patient. NP Olena Heckle notified.

## 2022-04-02 NOTE — Progress Notes (Signed)
Per Dr. Georgette Dover, ok to cancel IR request to drain GB fossa collection as this is not clinically indicated.  The order will be deleted.   Soyla Dryer, [place] [phone] 04/02/2022, 8:32 AM

## 2022-04-02 NOTE — Progress Notes (Signed)
Mobility Specialist - Progress Note   04/02/22 0934  Mobility  Activity  (BLE)  Activity Response Tolerated fair  $Mobility charge 1 Mobility   Pt was found in bed and agreeable to BLE by nodding his head. Pt was able to attempt x5 single hand squeezes on both hands. Afterwards pt was assisted in performing x5 ankle pumps on each foot and when asked to attempt to do them himself he had more mobility on his L foot than his R. Afterwards he attempted x3 double hand squeezes and when asked if he was getting fatigued he nodded his head. At EOS was left with all necessities in reach.  Ferd Hibbs Mobility Specialist

## 2022-04-02 NOTE — Progress Notes (Addendum)
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  He was initially worked up for possible sepsis from biliary infection but HIDA scan was negative.  General surgery and IR consulted for possible abscess formation but think it is more consistent with postsurgical changes at this time.  He has been treated empirically with cefepime and Flagyl with improvement in his white cell count.  However continues to have confusion and weakness.  Assessment and Plan:  Sepsis Leukocytosis, improving Etiology of sepsis with fever, leukocytosis was possibly from a biliary infection.  CT showed small gas collection in the gallbladder fossa after cholecystectomy with possible abscess formation.  HIDA scan was negative.  Surgery and IR did not think there was enough fluid for aspiration.  Additionally surgery did not think there was concern for infection in the gallbladder fossa.  Other workup has included negative blood culture, urine culture, respiratory virus panel.  Of note we had to discontinue vancomycin for possible "red man" syndrome.  We will treat for presumed intra-abdominal infection. - Continue cefepime, Flagyl - Consider reimaging CT abdomen to examine for changes of possible gallbladder fossa collection  Acute metabolic encephalopathy Discussing with wife patient seems more confused than baseline.  However after starting IV antibiotics he has improved some.  He was able to recognize a church member today.  Etiology is likely multifactorial.  Outpatient oncology notes minimal communication at baseline.  He also has brain metastases, sepsis as above.  He has low a.m. cortisol (possible from  brain mets) as well but did not think this is primary etiology of his acute encephalopathy and would not explain fever.  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  He was started on Decadron by oncology. - Oncology following appreciate input - Decadron 4 mg IV daily - Keppra seizure prophylaxis  Urinary retention Gross hematuria AKI, resolved Patient urinary tension followed by gross hematuria after catheter placement.  UA notable for no leukocyte esterase, nitrate, rare bacteria.  It was not reflexed to culture.  Do not think this represents a urinary tract infection at this time.  AKI has resolved. - Foley care  Hypophosphatemia - IV phosphate replacement  Normocytic anemia -CBC qAM     Subjective: Limited due to mental status.  Physical Exam: Vitals:   04/01/22 1430 04/01/22 2220 04/02/22 0351 04/02/22 1436  BP: 103/73 135/64 118/61 (!) 135/52  Pulse: 96 84 72 77  Resp: '20 20 20 18  '$ Temp: 99 F (37.2 C) 98.6 F (37 C) 97.7 F (36.5 C) 98.5 F (36.9 C)  TempSrc: Oral Oral Oral Oral  SpO2: 99% 99% 99% 99%  Weight:      Height:       Physical Exam Vitals and nursing note reviewed.  Constitutional:      General: He is not in acute distress. HENT:     Head: Normocephalic and atraumatic.  Eyes:     Pupils: Pupils are equal, round, and reactive  to light.  Cardiovascular:     Rate and Rhythm: Normal rate.     Heart sounds: No murmur heard. Pulmonary:     Effort: Pulmonary effort is normal.     Breath sounds: Normal breath sounds.  Abdominal:     General: Abdomen is flat. Bowel sounds are normal.     Tenderness: There is no abdominal tenderness. There is no rebound.  Musculoskeletal:      Right lower leg: No edema.     Left lower leg: No edema.  Neurological:     Mental Status: He is alert. He is disoriented.  Psychiatric:        Mood and Affect: Mood normal.     Data Reviewed:     Latest Ref Rng & Units 04/02/2022   11:46 AM 04/02/2022    2:57 AM 04/01/2022    3:22 AM  CBC  WBC 4.0 - 10.5 K/uL 13.3  15.7  24.8   Hemoglobin 13.0 - 17.0 g/dL 7.7  7.8  9.0   Hematocrit 39.0 - 52.0 % 26.5  26.8  30.4   Platelets 150 - 400 K/uL 335  390  503       Latest Ref Rng & Units 04/02/2022   11:46 AM 04/02/2022    2:57 AM 04/01/2022    3:22 AM  CMP  Glucose 70 - 99 mg/dL 187  150  166   BUN 8 - 23 mg/dL 20  24  36   Creatinine 0.61 - 1.24 mg/dL 0.60  0.60  1.27   Sodium 135 - 145 mmol/L 141  139  139   Potassium 3.5 - 5.1 mmol/L 4.0  3.7  4.3   Chloride 98 - 111 mmol/L 113  111  107   CO2 22 - 32 mmol/L '20  20  19   '$ Calcium 8.9 - 10.3 mg/dL 8.4  8.5  9.1   Total Protein 6.5 - 8.1 g/dL  5.6  5.9   Total Bilirubin 0.3 - 1.2 mg/dL  0.5  0.2   Alkaline Phos 38 - 126 U/L  110  125   AST 15 - 41 U/L  18  16   ALT 0 - 44 U/L  35  48      Family Communication: d/w wife  Disposition: Status is: Inpatient Remains inpatient appropriate because: sepsis  Planned Discharge Destination: Skilled nursing facility    Time spent: 45 minutes  Author: Lorelei Pont, MD 04/02/2022 4:01 PM  For on call review [email or web address].

## 2022-04-02 NOTE — Progress Notes (Signed)
Subjective/Chief Complaint: Patient lethargic, but arousable No abdominal pain   Objective: Vital signs in last 24 hours: Temp:  [97.7 F (36.5 C)-99 F (37.2 C)] 97.7 F (36.5 C) (02/05 0351) Pulse Rate:  [72-96] 72 (02/05 0351) Resp:  [20] 20 (02/05 0351) BP: (103-135)/(61-73) 118/61 (02/05 0351) SpO2:  [99 %] 99 % (02/05 0351) Last BM Date : 04/02/22  Intake/Output from previous day: 02/04 0701 - 02/05 0700 In: 340 [P.O.:340] Out: 4950 [Urine:4950] Intake/Output this shift: No intake/output data recorded.  Awake, but sleepy Abd - soft, non-tender  Lab Results:  Recent Labs    04/01/22 0322 04/02/22 0257  WBC 24.8* 15.7*  HGB 9.0* 7.8*  HCT 30.4* 26.8*  PLT 503* 390   BMET Recent Labs    04/01/22 0322 04/02/22 0257  NA 139 139  K 4.3 3.7  CL 107 111  CO2 19* 20*  GLUCOSE 166* 150*  BUN 36* 24*  CREATININE 1.27* 0.60*  CALCIUM 9.1 8.5*   PT/INR Recent Labs    03/30/22 1138 03/31/22 0417  LABPROT 15.7* 15.8*  INR 1.3* 1.3*   ABG No results for input(s): "PHART", "HCO3" in the last 72 hours.  Invalid input(s): "PCO2", "PO2"  Studies/Results: No results found.  Anti-infectives: Anti-infectives (From admission, onward)    Start     Dose/Rate Route Frequency Ordered Stop   03/31/22 1600  vancomycin (VANCOREADY) IVPB 1250 mg/250 mL        1,250 mg 166.7 mL/hr over 90 Minutes Intravenous Every 24 hours 03/31/22 0843     03/31/22 1300  vancomycin (VANCOREADY) IVPB 1500 mg/300 mL  Status:  Discontinued        1,500 mg 150 mL/hr over 120 Minutes Intravenous Every 24 hours 03/30/22 1447 03/31/22 0843   03/30/22 2200  metroNIDAZOLE (FLAGYL) IVPB 500 mg  Status:  Discontinued        500 mg 100 mL/hr over 60 Minutes Intravenous Every 12 hours 03/30/22 1623 03/30/22 1630   03/30/22 2200  metroNIDAZOLE (FLAGYL) IVPB 500 mg        500 mg 100 mL/hr over 60 Minutes Intravenous Every 12 hours 03/30/22 1446     03/30/22 2100  ceFEPIme (MAXIPIME) 2  g in sodium chloride 0.9 % 100 mL IVPB        2 g 200 mL/hr over 30 Minutes Intravenous Every 8 hours 03/30/22 1440     03/30/22 1045  vancomycin (VANCOREADY) IVPB 2000 mg/400 mL        2,000 mg 200 mL/hr over 120 Minutes Intravenous  Once 03/30/22 1038 03/30/22 1529   03/30/22 1030  ceFEPIme (MAXIPIME) 2 g in sodium chloride 0.9 % 100 mL IVPB        2 g 200 mL/hr over 30 Minutes Intravenous  Once 03/30/22 1029 03/30/22 1250   03/30/22 1030  metroNIDAZOLE (FLAGYL) IVPB 500 mg        500 mg 100 mL/hr over 60 Minutes Intravenous  Once 03/30/22 1029 03/30/22 1400   03/30/22 1030  vancomycin (VANCOCIN) IVPB 1000 mg/200 mL premix  Status:  Discontinued        1,000 mg 200 mL/hr over 60 Minutes Intravenous  Once 03/30/22 1029 03/30/22 1038       Assessment/Plan: Metastatic renal cell carcinoma S/p laparoscopic cholecystectomy 03/15/22 Presented with fever, leukocytosis  HIDA scan negative for leak Small fluid collection in GB fossa with dots of air - probably secondary to Surgicel SNOW placed in GB fossa at surgery - this will dissolve.  WBC improving  His recent surgery is not likely the cause of his recent deterioration.    LOS: 3 days    Phillip [name] 04/02/2022

## 2022-04-03 ENCOUNTER — Other Ambulatory Visit (HOSPITAL_COMMUNITY): Payer: Self-pay

## 2022-04-03 DIAGNOSIS — C649 Malignant neoplasm of unspecified kidney, except renal pelvis: Secondary | ICD-10-CM | POA: Diagnosis not present

## 2022-04-03 DIAGNOSIS — A419 Sepsis, unspecified organism: Secondary | ICD-10-CM | POA: Diagnosis not present

## 2022-04-03 DIAGNOSIS — R6520 Severe sepsis without septic shock: Secondary | ICD-10-CM | POA: Diagnosis not present

## 2022-04-03 DIAGNOSIS — G9341 Metabolic encephalopathy: Secondary | ICD-10-CM | POA: Diagnosis not present

## 2022-04-03 LAB — CBC
HCT: 29.3 % — ABNORMAL LOW (ref 39.0–52.0)
HCT: 28 % — ABNORMAL LOW (ref 39.0–52.0)
Hemoglobin: 8.6 g/dL — ABNORMAL LOW (ref 13.0–17.0)
Hemoglobin: 8.1 g/dL — ABNORMAL LOW (ref 13.0–17.0)
MCH: 25.4 pg — ABNORMAL LOW (ref 26.0–34.0)
MCH: 25.4 pg — ABNORMAL LOW (ref 26.0–34.0)
MCHC: 29.4 g/dL — ABNORMAL LOW (ref 30.0–36.0)
MCHC: 28.9 g/dL — ABNORMAL LOW (ref 30.0–36.0)
MCV: 86.7 fL (ref 80.0–100.0)
MCV: 87.8 fL (ref 80.0–100.0)
Platelets: 444 10*3/uL — ABNORMAL HIGH (ref 150–400)
Platelets: 376 10*3/uL (ref 150–400)
RBC: 3.38 MIL/uL — ABNORMAL LOW (ref 4.22–5.81)
RBC: 3.19 MIL/uL — ABNORMAL LOW (ref 4.22–5.81)
RDW: 18.4 % — ABNORMAL HIGH (ref 11.5–15.5)
RDW: 18.5 % — ABNORMAL HIGH (ref 11.5–15.5)
WBC: 17.5 10*3/uL — ABNORMAL HIGH (ref 4.0–10.5)
WBC: 12.7 10*3/uL — ABNORMAL HIGH (ref 4.0–10.5)
nRBC: 0 % (ref 0.0–0.2)
nRBC: 0 % (ref 0.0–0.2)

## 2022-04-03 LAB — PHOSPHORUS: Phosphorus: 1.9 mg/dL — ABNORMAL LOW (ref 2.5–4.6)

## 2022-04-03 LAB — TYPE AND SCREEN
ABO/RH(D): O POS
Antibody Screen: NEGATIVE

## 2022-04-03 LAB — ABO/RH: ABO/RH(D): O POS

## 2022-04-03 LAB — COMPREHENSIVE METABOLIC PANEL
ALT: 34 U/L (ref 0–44)
AST: 18 U/L (ref 15–41)
Albumin: 2.5 g/dL — ABNORMAL LOW (ref 3.5–5.0)
Alkaline Phosphatase: 121 U/L (ref 38–126)
Anion gap: 7 (ref 5–15)
BUN: 18 mg/dL (ref 8–23)
CO2: 23 mmol/L (ref 22–32)
Calcium: 8.6 mg/dL — ABNORMAL LOW (ref 8.9–10.3)
Chloride: 109 mmol/L (ref 98–111)
Creatinine, Ser: 0.52 mg/dL — ABNORMAL LOW (ref 0.61–1.24)
GFR, Estimated: >60 mL/min (ref >60)
Glucose, Bld: 180 mg/dL — ABNORMAL HIGH (ref 70–99)
Potassium: 3.7 mmol/L (ref 3.5–5.1)
Sodium: 139 mmol/L (ref 135–145)
Total Bilirubin: 0.5 mg/dL (ref 0.3–1.2)
Total Protein: 6.4 g/dL — ABNORMAL LOW (ref 6.5–8.1)

## 2022-04-03 LAB — MAGNESIUM: Magnesium: 2.2 mg/dL (ref 1.7–2.4)

## 2022-04-03 MED ORDER — SODIUM PHOSPHATES 45 MMOLE/15ML IV SOLN
30.0000 mmol | Freq: Once | INTRAVENOUS | Status: AC
  Administered 2022-04-03: 30 mmol via INTRAVENOUS
  Filled 2022-04-03: qty 10

## 2022-04-03 NOTE — NC FL2 (Signed)
[place] LEVEL OF CARE FORM     IDENTIFICATION  Patient Name: Phillip [name] Birthdate: 1945-04-17 Sex: male Admission Date (Current Location): 03/30/2022  [facility] and [place] Number:  Herbalist and Address:  [facility],  [place] [place], [place]      Provider Number: [id number]  Attending Physician Name and Address:  Lorelei Pont, MD  Relative Name and Phone Number:  [name] (Spouse) [phone] (Mobile)    Current Level of Care: Hospital Recommended Level of Care: [place] Prior Approval Number:    Date Approved/Denied:   PASRR Number: [id number]  Discharge Plan: SNF    Current Diagnoses: Patient Active Problem List   Diagnosis Date Noted   Normocytic anemia 03/30/2022   Hypercalcemia 03/29/2022   S/P laparoscopic cholecystectomy 03/15/2022   Acute cholecystitis 01/08/2022   Seizure disorder ([place]) 01/08/2022   Sepsis ([place]) 01/08/2022   AKI (acute kidney injury) ([place]) 40/76/8088   Acute metabolic encephalopathy 12/29/1592   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   Cellulitis of leg, right 04/20/2019   Metastasis to brain ([place]) 03/03/2019   Spinal cord compression ([place]) 10/18/2016   HTN (hypertension) 10/18/2016   Pain management 10/18/2016   Port catheter in place 07/06/2016   Goals of care, counseling/discussion 04/04/2016   Renal cell carcinoma ([place]) 11/22/2015   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    Orientation RESPIRATION BLADDER Height & Weight     Self  Normal Continent Weight: 225 lb (102.1 kg) Height:  '5\' 11"'$  (180.3 cm)  BEHAVIORAL SYMPTOMS/MOOD NEUROLOGICAL BOWEL NUTRITION STATUS      Continent Diet  AMBULATORY STATUS COMMUNICATION OF NEEDS Skin   Limited Assist Verbally Normal                       Personal Care Assistance Level of Assistance  Bathing, Feeding, Dressing Bathing Assistance: Limited assistance Feeding assistance: Independent Dressing Assistance:  Limited assistance     Functional Limitations Info  Sight, Hearing, Speech Sight Info: Adequate Hearing Info: Adequate Speech Info: Impaired    SPECIAL CARE FACTORS FREQUENCY  PT (By licensed PT), OT (By licensed OT)     PT Frequency: 5 x a week OT Frequency: 5 x a week            Contractures Contractures Info: Not present    Additional Factors Info  Code Status, Allergies Code Status Info: DNR Allergies Info: Contrast Media (iodinated Contrast Media)           Current Medications (04/03/2022):  This is the current hospital active medication list Current Facility-Administered Medications  Medication Dose Route Frequency Provider Last Rate Last Admin   0.9 %  sodium chloride infusion   Intravenous Continuous [name], MD 100 mL/hr at 04/03/22 1125 New Bag at 04/03/22 1125   acetaminophen (TYLENOL) suppository 650 mg  650 mg Rectal Q6H PRN Marylyn Ishihara, Tyrone A, DO       ceFEPIme (MAXIPIME) 2 g in sodium chloride 0.9 % 100 mL IVPB  2 g Intravenous Q8H Lenis Noon, RPH 200 mL/hr at 04/03/22 0954 2 g at 04/03/22 0954   Chlorhexidine Gluconate Cloth 2 % PADS 6 each  6 each Topical Daily Kyle, Tyrone A, DO   6 each at 04/03/22 0959   dexamethasone (DECADRON) injection 4 mg  4 mg Intravenous Daily Kyle, Tyrone A, DO   4 mg at 04/03/22 0959   hydrALAZINE (APRESOLINE) injection 10 mg  10 mg Intravenous Q8H  PRN Cherylann Ratel A, DO   10 mg at 03/31/22 0023   levETIRAcetam (KEPPRA) 250 mg in sodium chloride 0.9 % 100 mL IVPB  250 mg Intravenous Q12H Kyle, Tyrone A, DO   Stopped at 04/03/22 0658   metroNIDAZOLE (FLAGYL) IVPB 500 mg  500 mg Intravenous Q12H Kyle, Tyrone A, DO 100 mL/hr at 04/03/22 0959 500 mg at 04/03/22 0959   morphine (PF) 2 MG/ML injection 2 mg  2 mg Intravenous Q2H PRN Florencia Reasons, MD   2 mg at 03/31/22 2316   Oral care mouth rinse  15 mL Mouth Rinse 4 times per day Florencia Reasons, MD   15 mL at 04/03/22 1505   Oral care mouth rinse  15 mL Mouth Rinse PRN Florencia Reasons, MD        oxyCODONE (Oxy IR/ROXICODONE) immediate release tablet 5 mg  5 mg Oral Q6H PRN Florencia Reasons, MD       oxyCODONE (OXYCONTIN) 12 hr tablet 10-20 mg  10-20 mg Oral Daily Florencia Reasons, MD   10 mg at 04/03/22 0954   oxyCODONE (OXYCONTIN) 12 hr tablet 20 mg  20 mg Oral QHS Florencia Reasons, MD   20 mg at 04/02/22 2301   prochlorperazine (COMPAZINE) injection 10 mg  10 mg Intravenous Q6H PRN Marylyn Ishihara, Tyrone A, DO       sodium chloride flush (NS) 0.9 % injection 10-40 mL  10-40 mL Intracatheter PRN Marylyn Ishihara, Tyrone A, DO   10 mL at 04/03/22 1243   sodium phosphate 30 mmol in dextrose 5 % 250 mL infusion  30 mmol Intravenous Once Lorelei Pont, MD 43 mL/hr at 04/03/22 1505 30 mmol at 04/03/22 1505   testosterone (ANDROGEL) 50 MG/5GM (1%) gel 5 g  5 g Transdermal Daily Marylyn Ishihara, Tyrone A, DO   5 g at 04/03/22 9211   Facility-Administered Medications Ordered in Other Encounters  Medication Dose Route Frequency Provider Last Rate Last Admin   sodium chloride flush (NS) 0.9 % injection 10 mL  10 mL Intravenous PRN Betsy Coder B, MD   10 mL at 06/22/16 0921   sodium chloride flush (NS) 0.9 % injection 10 mL  10 mL Intravenous PRN Owens Shark, NP   10 mL at 08/21/21 1325   sodium chloride flush (NS) 0.9 % injection 10 mL  10 mL Intravenous PRN [name], MD   10 mL at 11/10/21 1002     Discharge Medications: Please see discharge summary for a list of discharge medications.  Relevant Imaging Results:  Relevant Lab Results:   Additional Information SSN [id number]  [place], LCSW

## 2022-04-03 NOTE — Progress Notes (Signed)
Speech Language Pathology Treatment: Dysphagia  Patient Details Name: Phillip [name] MRN: [id number] DOB: [birth year] Today's Date: 04/03/2022 Time: 0945-1000 SLP Time Calculation (min) (ACUTE ONLY): 15 min  Assessment / Plan / Recommendation Clinical Impression  Patient seen by SLP for skilled treatment focused on dysphagia goals. Patient was awake, alert pleasant. He is not able to verbally or non-verbally communicate his wants/needs but was able to follow basic commands. SLP observed him with PO intake of thin liquids, puree solids and soft solids (graham cracker). He exhibited prolonged mastication with graham cracker (he is edentulous) and initially had difficulty drawing liquid through straw when drinking grape juice. His swallow initiation appeared timely and no overt s/s aspiration or penetration observed. Patient continues to present with an oral phase dysphagia suspected to be secondary to cognitive impairment. Recommendation is to upgrade PO's from full liquids to Dys 1 (puree) and thin liquids. RN and NT in room and both informed, swallow safety/recommendations sign posted in patient's room. SLP will follow patient briefly to ensure toleration of PO's.   HPI HPI: Pt is a 77 year old male admitted for sepsis secondary to biliary infection and with history of S4 RCC w/ liver, pulm and brain mets.  PMHx including but not limited to anemia, CKD, renal CA, HTN, seizure, cholecystitis in 11/23 with cholecystostomy tube placed - and lap cholecystectomy 03/15/22.  A brain MRI this week confirmed increase in the size of a dominant left frontal lobe metastasis with mild worsening of edema.      SLP Plan  Continue with current plan of care      Recommendations for follow up therapy are one component of a multi-disciplinary discharge planning process, led by the attending physician.  Recommendations may be updated based on patient status, additional functional criteria and insurance  authorization.    Recommendations  Diet recommendations: Dysphagia 1 (puree);Thin liquid Liquids provided via: Cup;Straw Medication Administration: Whole meds with puree Supervision: Trained caregiver to feed patient;Staff to assist with self feeding;Full supervision/cueing for compensatory strategies Compensations: Slow rate;Small sips/bites Postural Changes and/or Swallow Maneuvers: Seated upright 90 degrees                Oral Care Recommendations: Oral care BID Follow Up Recommendations: No SLP follow up SLP Visit Diagnosis: Dysphagia, oral phase (R13.11) Plan: Continue with current plan of care         Sonia Baller, MA, CCC-SLP Speech Therapy

## 2022-04-03 NOTE — Progress Notes (Signed)
Spoke with patient wife about transferring patient to chair; pt wife would like PT to see patient first before we attempt to get him in the chair with hoyer; messaged doctor as well for consult

## 2022-04-03 NOTE — Progress Notes (Signed)
Chaplain engaged in an initial visit with Phillip [name] and his wife Phillip [name].  Phillip [name] requested [employer] for prayer.  Chaplain offered a prayer over Phillip [name] who was peacefully resting, and then engaged in some narrative life review.    Phillip [name] and Phillip [name] have been each other's life partners over the last ten years.  They made a divine connection and really believe God brought them together.  Phillip [name], who is a former Airline pilot, has been with Phillip [name] throughout his cancer diagnosis.  They have both helped each other through healthcare crises.  Phillip [name] is hoping for Phillip [name] to get over this stage and then they will figure out what comes next.  She recognizes that Phillip [name] has become less communicative.   They have a blended family, which includes four children.  They are well supported by their kids and faith community.  They are apart of the [place] tradition.  Chaplain offered reflective listening, a compassionate presence, and support.   Chaplain Azai Gaffin, MDiv   04/03/22 1200  Spiritual Encounters  Type of Visit Initial  Care provided to: Pt and family  Referral source Family  Reason for visit Routine spiritual support  Spiritual Framework  Presenting Themes Values and beliefs;Significant life change;Rituals and practive;Community and relationships  Values/beliefs [employer] faith tradition  Community/Connection Spiritual leader;Faith community;Friend(s);Family  Strengths Well supported  Interventions  Spiritual Care Interventions Made Prayer;Established relationship of care and support;Compassionate presence;Reflective listening;Narrative/life review  Intervention Outcomes  Outcomes Connection to spiritual care;Awareness around self/spiritual resourses;Awareness of support  Spiritual Care Plan  Spiritual Care Issues Still Outstanding Chaplain will continue to follow

## 2022-04-03 NOTE — Progress Notes (Signed)
PHARMACY NOTE -  Cefepime  Pharmacy has been assisting with dosing of cefepime for intra-abdominal infection. Dosage remains stable at 2g IV q8 hr and further renal adjustments per institutional Pharmacy antibiotic protocol  Pharmacy will sign off, following peripherally for culture results, dose adjustments, and length of therapy. Please reconsult if a change in clinical status warrants re-evaluation of dosage.  Reuel Boom, PharmD, BCPS [phone] 04/03/2022, 1:09 PM

## 2022-04-03 NOTE — Progress Notes (Addendum)
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  He was initially worked up for possible sepsis from biliary infection but HIDA scan was negative.  General surgery and IR consulted for possible abscess formation but think it is more consistent with postsurgical changes at this time.  He has been treated empirically with cefepime and Flagyl with improvement in his white cell count, and confusion.  Assessment and Plan:  Sepsis Leukocytosis, improving Etiology of sepsis with fever, leukocytosis was possibly from a biliary infection.  CT showed small gas collection in the gallbladder fossa after cholecystectomy with possible abscess formation.  HIDA scan was negative.  Surgery and IR did not think there was enough fluid for aspiration.  Additionally surgery did not think there was concern for infection in the gallbladder fossa.  Other workup has included negative blood culture, urine culture, respiratory virus panel.  Of note we had to discontinue vancomycin for possible "red man" syndrome.  We will treat for presumed intra-abdominal infection.  He continues to have confusion though seems improved so we will continue IV antibiotics at this time. - Continue cefepime, Flagyl - Consider reimaging CT abdomen to examine for changes of possible gallbladder fossa collection  Acute metabolic encephalopathy Discussing with wife patient seems more confused than baseline.  However after starting IV antibiotics he has improved some. Etiology is likely multifactorialfrom brain metastases, sepsis as above.  Outpatient oncology notes minimal communication at baseline.  Wife reports prior to this  hospitalization was able to at least participate in physical therapy.  He seems clearly off his baseline still.  He has low a.m. cortisol as well but did not think this is primary etiology of his acute encephalopathy.  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  He was started on Decadron by oncology. - Oncology following appreciate input - Decadron 4 mg IV daily - Keppra seizure prophylaxis  Urinary retention Gross hematuria AKI, resolved Patient urinary tension followed by gross hematuria after catheter placement.  UA notable for no leukocyte esterase, nitrate, rare bacteria.  It was not reflexed to culture.  Do not think this represents a urinary tract infection at this time.  AKI has resolved. - Foley care  Hypophosphatemia - IV phosphate replacement  Normocytic anemia -CBC qAM  Code: dnr Dvt ppx: SCD     Subjective: Limited due to mental status.  Physical Exam: Vitals:   04/02/22 0351 04/02/22 1436 04/02/22 2016 04/03/22 0459  BP: 118/61 (!) 135/52 (!) 159/70 (!) 148/88  Pulse: 72 77 87 94  Resp: '20 18 20 18  '$ Temp: 97.7 F (36.5 C) 98.5 F (36.9 C) 99.1 F (37.3 C) 98.9 F (37.2 C)  TempSrc: Oral Oral Oral Oral  SpO2: 99% 99% 99% 98%  Weight:      Height:       Physical Exam Vitals and nursing note reviewed.  Constitutional:      General: He  is not in acute distress. HENT:     Head: Normocephalic and atraumatic.  Eyes:     Pupils: Pupils are equal, round, and reactive to light.  Cardiovascular:     Rate and Rhythm: Normal rate.     Heart sounds: No murmur heard. Pulmonary:     Effort: Pulmonary effort is normal.     Breath sounds: Normal breath  sounds.  Abdominal:     General: Abdomen is flat. Bowel sounds are normal.     Tenderness: There is no abdominal tenderness. There is no rebound.  Musculoskeletal:     Right lower leg: No edema.     Left lower leg: No edema.  Neurological:     Mental Status: He is alert. He is disoriented.  Psychiatric:        Mood and Affect: Mood normal.     Data Reviewed:     Latest Ref Rng & Units 04/03/2022   12:39 PM 04/03/2022   12:40 AM 04/02/2022   11:46 AM  CBC  WBC 4.0 - 10.5 K/uL 12.7  17.5  13.3   Hemoglobin 13.0 - 17.0 g/dL 8.1  8.6  7.7   Hematocrit 39.0 - 52.0 % 28.0  29.3  26.5   Platelets 150 - 400 K/uL 376  444  335       Latest Ref Rng & Units 04/03/2022   12:40 AM 04/02/2022   11:46 AM 04/02/2022    2:57 AM  CMP  Glucose 70 - 99 mg/dL 180  187  150   BUN 8 - 23 mg/dL '18  20  24   '$ Creatinine 0.61 - 1.24 mg/dL 0.52  0.60  0.60   Sodium 135 - 145 mmol/L 139  141  139   Potassium 3.5 - 5.1 mmol/L 3.7  4.0  3.7   Chloride 98 - 111 mmol/L 109  113  111   CO2 22 - 32 mmol/L '23  20  20   '$ Calcium 8.9 - 10.3 mg/dL 8.6  8.4  8.5   Total Protein 6.5 - 8.1 g/dL 6.4   5.6   Total Bilirubin 0.3 - 1.2 mg/dL 0.5   0.5   Alkaline Phos 38 - 126 U/L 121   110   AST 15 - 41 U/L 18   18   ALT 0 - 44 U/L 34   35      Family Communication: d/w wife  Disposition: Status is: Inpatient Remains inpatient appropriate because: sepsis  Planned Discharge Destination: Skilled nursing facility    Time spent: 45 minutes  Author: Lorelei Pont, MD 04/03/2022 1:34 PM  For on call review [email or web address].

## 2022-04-03 NOTE — TOC Progression Note (Addendum)
Transition of Care [facility]) - Progression Note    Patient Details  Name: Phillip [name] MRN: [id number] Date of Birth: 1945-05-06  Transition of Care [facility]) CM/SW [place], LCSW Phone Number: 04/03/2022, 4:37 PM  Clinical Narrative:    CSW spoke with pt wife regarding PT recommendations for SNF  and she has agreed to placement. Pt spouse stated pt has never been to rehab. Pt's spouse prefers [facility] or [facility]. CSW explained the process for  SNF placement.  CSW will work pt up for SNF placement. TOC to follow.    Expected Discharge Plan: [place] Barriers to Discharge: Continued Medical Work up  Expected Discharge Plan and Services       Living arrangements for the past 2 months: Single Family Home                                       Social Determinants of Health (SDOH) Interventions [place]: No Food Insecurity (03/30/2022)  Housing: Low Risk  (03/30/2022)  Transportation Needs: No Transportation Needs (03/30/2022)  Utilities: Not At Risk (03/30/2022)  Tobacco Use: Low Risk  (03/30/2022)    Readmission Risk Interventions    01/09/2022    1:14 PM  Readmission Risk Prevention Plan  Transportation Screening Complete  PCP or Specialist Appt within 3-5 Days Complete  Social Work Consult for [place] Planning/Counseling Complete  Palliative Care Screening Complete  Medication Review Press photographer) Complete

## 2022-04-03 NOTE — Progress Notes (Signed)
IP PROGRESS NOTE  Subjective:   Phillip [name] is alert this morning.  He appears comfortable.    Objective: Vital signs in last 24 hours: Blood pressure (!) 148/88, pulse 94, temperature 98.9 F (37.2 C), temperature source Oral, resp. rate 18, height '5\' 11"'$  (1.803 m), weight 225 lb (102.1 kg), SpO2 98 %.  Intake/Output from previous day: 02/05 0701 - 02/06 0700 In: 5569.5 [P.O.:240; I.V.:3783.9; IV Piggyback:1545.6] Out: 1350 [Urine:1350]  Physical Exam:  HEENT: The tongue is dry,, no thrush Lungs: Clear anteriorly, no respiratory distress Cardiac: Regular rate and rhythm Abdomen: Mildly distended, healed surgical incisions, firmness with associated tenderness in the left lower abdomen, tenderness in the right lower abdomen Extremities: No leg edema Neurologic: Oriented, arousable, moves all extremities to command, speaks a few words, not talking in sentences Skin: Anterior chest erythema appears to be fading  Portacath/PICC-without erythema  Lab Results: Recent Labs    04/03/22 0040 04/03/22 1239  WBC 17.5* 12.7*  HGB 8.6* 8.1*  HCT 29.3* 28.0*  PLT 444* 376    BMET Recent Labs    04/02/22 1146 04/03/22 0040  NA 141 139  K 4.0 3.7  CL 113* 109  CO2 20* 23  GLUCOSE 187* 180*  BUN 20 18  CREATININE 0.60* 0.52*  CALCIUM 8.4* 8.6*    Lab Results  Component Value Date   CEA1 1.77 01/04/2016    Studies/Results: No results found.  Medications: I have reviewed the patient's current medications.  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, L4 nerve compression Biopsy of the L4 mass 11/18/2015 confirmed metastatic renal cell carcinoma, clear cell type CTs of the chest, abdomen, and pelvis 11/18/2015-right lower lobe nodule, expansile lytic lesion at the right 11th rib/costal vertebral junction, left renal mass, expansile lesion involving the L4 vertebra, lytic lesion at the left acetabulum, and a low-attenuation  liver lesion Initiation of SRS to L4 12/02/2015, Completed 12/12/2015 Initiation of Pazopanib 12/30/2015 Pazopanib placed on hold 02/06/2016 secondary to elevated liver enzymes Pazopanib resumed 03/07/2016 at a dose of 400 mg daily  Pazopanib discontinued 03/19/2016 secondary to elevated liver enzymes Restaging CTs 04/02/2016-stable left renal mass, decreased soft tissue component associated with the L4 metastasis, increased soft tissue component associated with the right 11th rib metastasis with increased T11 bony destruction, increased sclerosis at the left acetabulum lesion Cycle 1 nivolumab 04/12/2016 Cycle 2 nivolumab 04/26/2016 Cycle 3 nivolumab 05/11/2016 Cycle 4 nivolumab 05/24/2016 Cycle 5 nivolumab 06/08/2016 MRI lumbar spine 06/21/2016-unchanged tumor at L3, increased size of retroperitoneal lymph nodes compared to a CT from 04/02/2016 Cycle 6 nivolumab  06/22/2016 CTs chest, abdomen, and pelvis 07/04/2016-enlargement of the left renal mass, right adrenal nodule, left hilar and peritoneal lymph nodes, enlargement of left acetabular lesion. Stable lung nodules. Cycle 7 nivolumab 07/06/2016 Cycle 8 nivolumab 07/20/2016 Cycle 9 nivolumab 08/02/2016 Cycle 10 nivolumab 08/20/2016 Restaging CT 09/03/2016 evaluation with stable disease Cycle 11 nivolumab 09/05/2016 Cycle 12 nivolumab 09/19/2016 Cycle 13 nivolumab 10/03/2016 Cycle 14 nivolumab 10/17/2016 Cycle 15 nivolumab 10/31/2016 Cycle 16 nivolumab 11/14/2016 (changed to monthly schedule) Cycle 17 nivolumab 12/19/2016 CTs 01/21/2017-increased left renal mass, increased size of adrenal metastases, increased lytic bone lesions, increased left lung nodule, persistent tumor at L4 with probable epidural component Initiation of Cabozantinib 01/28/2017 Restaging CTs 05/30/2017- decreased size of left hilar mass, left renal mass, retroperitoneal adenopathy, and adrenal metastasis.  Healing bone lesions. Cabozantinib continued CTs  10/21/2017- interval enlargement left hilar lymph node; stable rib lesions; stable mass left renal cortex; stable  mildly nodular adrenal glands; stable lytic lesions within the pelvis and spine. Cabozantinib continued CTs 02/21/2018- enlargement of an AP window lymph node.  Mildly enlarged left hilar lymph node is unchanged.  Primary renal cell carcinoma involving the upper pole of the left kidney appears similar.  Stable enlarged right periaortic lymph node adjacent to the renal vessels.  Stable multifocal bony metastatic disease. Cabozantinib continued CTs 07/29/2018- stable 15 mm AP window nodes; stable left hilar node; subcarinal node slightly larger; right periaortic node 16 mm, previously 12 mm; portacaval node 24 mm, previously 16 mm; 15 mm node superior to the pancreatic head has enlarged; interval increase nodularity of both adrenal glands; stable left kidney mass; multifocal bony metastatic disease not significantly changed. Cabozantinib continued CTs 11/06/2018- moderate improvement in thoracic adenopathy; minimal improvement abdominal adenopathy; left kidney upper pole mass and various lytic expansile bone lesions stable; mild increase in nodularity of left adrenal gland; right adrenal gland nodularity stable. 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  Peripheral enhancing masses consistent with metastases. No hemorrhage. MRI brain 03/11/2019-7 enhancing brain masses consistent with metastatic disease SRS to 7 brain lesions, treatment given 03/19/2019, 03/23/2019, and 03/25/2019 CTs 04/09/2019-decrease in left renal mass, bilateral adrenal nodules, AP window and porta hepatic adenopathy.  New 9 mm right lower lobe nodule.  Improved lytic lesion of the left acetabulum.  Other bone lesions are stable. Cabozantinib continued MRI brain 07/17/2019-resolution of 4 mm treated lesion in the right  frontal cortex, 6 remaining treated lesions have decreased in size, new punctate metastasis in the superior right cerebellum SRS to right cerebellar lesion 07/30/2019 CTs 08/17/2019-previously noted right lower lobe nodule resolved, stable left kidney mass, mixed lytic/sclerotic bone lesions in the thoracolumbar spine, right posterior ribs, and left acetabulum-unchanged, no evidence of progressive disease  Cabozantinib continued MRI brain 10/23/2019-stable to slight decrease in size of multiple enhancing intracranial lesions.  Slight increase in surrounding edema in the medial left frontal lobe.  No new lesions present. MRI brain 01/29/2020-multiple enhancing brain lesions, some hemorrhagic, some with mild enlargement-treatment effect? CTs 02/08/2020-no thoracic metastases, enlargement of the left renal mass, enlargement of lytic lesion at T9, other lytic lesions unchanged MRI brain 05/04/2020-no new lesions, slight enlargement of a right occipital lesion, other lesions are stable or decreased in size CTs 08/11/2020-enlargement of left kidney mass, new 1.6 cm segment 7 liver lesion, enlargement of a lytic lesion at T9, increased lysis of a sclerotic lesion at the right second rib, 0.7 cm endoluminal nodule at the bladder dome-enlarged Brain MRI 08/10/2020-slight increase in a 15 mm left frontal lobe lesion, new punctate focus in the right occipital cortex, other lesions stable or slightly decreased CTs 11/01/2020-increase in left suprahilar opacity, enlargement of previous liver metastases, several new subcentimeter lesions, increase in left renal mass, similar appearance of bone metastases, stable hyperdense/enhancing nodule in the left bladder Brain MRI 11/04/2020-no change in brain metastases Lenvatinib/pembrolizumab 11/30/2020 CTs 03/03/2021-stable left hilar lymph nodes; resolution of left upper lobe perihilar nodularity; unchanged for millimeter peripheral right upper lobe nodule; multiple lytic bone lesions  appear stable; left kidney lesion decreased in size; bilateral adrenal nodules decreased in size; several liver lesion showed mild increase in size; several new small lesions within the right hepatic lobe. Lenvatinib/pembrolizumab continued 03/07/2021 Brain MRI 03/10/2021-progression of a left frontal metastasis, tiny new metastasis in the right frontal lobe-referred for SBRT to the right frontal lesion and to [facility] to consider  a LITT procedure for the progressive left frontal lesion SRS 04/06/2021, LITT procedure at [facility] 04/18/2021-pathology metastatic renal cell carcinoma in background of necrosis/gliosis Lenvatinib on hold Pembrolizumab held 04/24/2021 Pembrolizumab 05/01/2021, lenvatinib remains on hold pending surgical evaluation Pembrolizumab 05/25/2021, lenvatinib resumed Pembrolizumab 06/15/2021 Lenvima resumed 06/21/2021 CTs 07/28/2021-continued regression left renal lesion.  Much improved appearance of the liver.  Continued regression of bilateral adrenal gland nodules.  Stable mixed lytic and sclerotic metastatic bone disease.  No new or progressive findings. Pembrolizumab 07/31/2021, lenvatinib continued Pembrolizumab held 08/21/2021 due to generalized weakness Pembrolizumab resumed 09/11/2021 MRI brain 10/19/2021-"new "focus of minimal enhancement of the right frontal lobe, larger punctate right cerebellar lesion, review of MRI found the right frontal lesion was present in 2021 and was treated with SRS CTs 11/12/2021-2 new right upper lung nodules, stable left renal lesion, enlarging lytic lesion in the left fourth rib Pembrolizumab and lenvatinib continued Brain 01/02/2022-new 4 mm lesion in the right parietal cortex the largest lesion in left frontal lobe stable in size with more solid  CTs chest, abdomen, and pelvis 01/08/2022-increased soft tissue component of a lytic left acetabular  metastasis and increased sclerotic component of the left fourth rib metastasis, decreased conspicuity of  right upper lobe nodules, no change in renal mass Pembrolizumab and lenvatinib continued MRI brain 03/27/2022-minimal increase size of a 3 mm right cerebellar lesion, increased size of enhancing mass at the left frontal lobe measuring 3 x 2.2 cm with adjacent gyriform enhancement and mild worsening of edema in the left frontal white matter CTs 03/30/2022-new pulmonary nodules, small gas collection in the gallbladder fossa, increased left acetabular metastasis   Pain secondary to #1-managed by Dr. Lovenia Shuck.  Improved Hypertension Elevated transaminases 02/06/2016- Pazopanib placed on hold Liver enzymes normal 03/07/2016 Port-A-Cath placement 05/16/2016 Malaise/anorexia 09/05/2016. Cortisol and testosterone levels low. Hydrocortisone and testosterone replacement initiated. Conjunctival/scleral erythema 09/19/2016-resolved with steroid eyedrops Proximal right leg weakness. 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 Hypercalcemia status post Zometa 01/23/2017-resolved Hypercalcemia 03/29/2022-Zometa Pruritic rash following IV contrast 10/21/2017; rash following IV contrast 02/21/2018 despite prednisone/Benadryl premedication Brief episodes of expressive aphasia October and December 2020 Vitamin B12 deficiency confirmed on lab 03/07/2021-started oral vitamin B12 replacement 03/28/2021 Admission 01/08/2022 with acute cholecystitis/sepsis, cholecystostomy tube Cholecystectomy 03/15/2022 14.  Admission 03/30/2022 with failure to thrive and fever 15.  Hematuria with urinary retention-likely secondary to bleeding from the renal mass   Phillip [name] has metastatic renal cell carcinoma.  A brain MRI this week confirmed increase in the size of a dominant left frontal lobe metastasis with mild worsening of edema.  He had hypercalcemia and was treated with Zometa on 03/29/2022.  Lenvatinib and pembrolizumab were discontinued.   He was admitted with a fever and failure to thrive.  There is no  clear source for infection.  The fever has resolved.  Cultures are negative.  A biliary scan was negative for a bile leak.  Hypercalcemia has resolved.  The leukocytosis has improved.  He appears more alert compared to when I saw him yesterday, but he remains confused.  I suspect the altered mental status is secondary to brain metastases/radiation and delirium from critical illness.[name]  He does not appear to be a candidate for further treatment of the renal cell carcinoma.  I recommend hospice care.  I will discuss this with his wife when she is available.  I will check on him to 09/15/2022.  Recommendations: Continue broad-spectrum intravenous antibiotics, follow-up cultures Continue Decadron Continue intravenous fluids, diet  as recommended by speech therapy OxyContin/oxycodone for pain Physical therapy Discussed hospice care with his wife     LOS: 4 days   Betsy Coder, MD   04/03/2022, 1:59 PM

## 2022-04-04 ENCOUNTER — Other Ambulatory Visit: Payer: Self-pay

## 2022-04-04 ENCOUNTER — Ambulatory Visit: Payer: PPO | Admitting: Internal Medicine

## 2022-04-04 ENCOUNTER — Other Ambulatory Visit: Payer: PPO

## 2022-04-04 DIAGNOSIS — A419 Sepsis, unspecified organism: Secondary | ICD-10-CM | POA: Diagnosis not present

## 2022-04-04 DIAGNOSIS — C649 Malignant neoplasm of unspecified kidney, except renal pelvis: Secondary | ICD-10-CM | POA: Diagnosis not present

## 2022-04-04 LAB — CULTURE, BLOOD (ROUTINE X 2)
Culture: NO GROWTH
Culture: NO GROWTH
Special Requests: ADEQUATE
Special Requests: ADEQUATE

## 2022-04-04 LAB — COMPREHENSIVE METABOLIC PANEL
ALT: 27 U/L (ref 0–44)
AST: 16 U/L (ref 15–41)
Albumin: 2.4 g/dL — ABNORMAL LOW (ref 3.5–5.0)
Alkaline Phosphatase: 108 U/L (ref 38–126)
Anion gap: 9 (ref 5–15)
BUN: 19 mg/dL (ref 8–23)
CO2: 23 mmol/L (ref 22–32)
Calcium: 8.2 mg/dL — ABNORMAL LOW (ref 8.9–10.3)
Chloride: 109 mmol/L (ref 98–111)
Creatinine, Ser: 0.6 mg/dL — ABNORMAL LOW (ref 0.61–1.24)
GFR, Estimated: >60 mL/min (ref >60)
Glucose, Bld: 149 mg/dL — ABNORMAL HIGH (ref 70–99)
Potassium: 3.4 mmol/L — ABNORMAL LOW (ref 3.5–5.1)
Sodium: 141 mmol/L (ref 135–145)
Total Bilirubin: 0.3 mg/dL (ref 0.3–1.2)
Total Protein: 6.2 g/dL — ABNORMAL LOW (ref 6.5–8.1)

## 2022-04-04 LAB — CBC
HCT: 29.1 % — ABNORMAL LOW (ref 39.0–52.0)
Hemoglobin: 8.5 g/dL — ABNORMAL LOW (ref 13.0–17.0)
MCH: 25.4 pg — ABNORMAL LOW (ref 26.0–34.0)
MCHC: 29.2 g/dL — ABNORMAL LOW (ref 30.0–36.0)
MCV: 87.1 fL (ref 80.0–100.0)
Platelets: 430 10*3/uL — ABNORMAL HIGH (ref 150–400)
RBC: 3.34 MIL/uL — ABNORMAL LOW (ref 4.22–5.81)
RDW: 18.6 % — ABNORMAL HIGH (ref 11.5–15.5)
WBC: 16.8 10*3/uL — ABNORMAL HIGH (ref 4.0–10.5)
nRBC: 0 % (ref 0.0–0.2)

## 2022-04-04 LAB — MAGNESIUM: Magnesium: 2.1 mg/dL (ref 1.7–2.4)

## 2022-04-04 LAB — PHOSPHORUS: Phosphorus: 2.3 mg/dL — ABNORMAL LOW (ref 2.5–4.6)

## 2022-04-04 MED ORDER — POTASSIUM CHLORIDE CRYS ER 20 MEQ PO TBCR
40.0000 meq | EXTENDED_RELEASE_TABLET | Freq: Once | ORAL | Status: AC
  Administered 2022-04-04: 40 meq via ORAL
  Filled 2022-04-04: qty 2

## 2022-04-04 MED ORDER — DEXAMETHASONE 4 MG PO TABS
4.0000 mg | ORAL_TABLET | Freq: Every day | ORAL | Status: DC
  Administered 2022-04-05 – 2022-04-30 (×26): 4 mg via ORAL
  Filled 2022-04-04 (×26): qty 1

## 2022-04-04 MED ORDER — SODIUM PHOSPHATES 45 MMOLE/15ML IV SOLN
30.0000 mmol | Freq: Once | INTRAVENOUS | Status: AC
  Administered 2022-04-04: 30 mmol via INTRAVENOUS
  Filled 2022-04-04: qty 10

## 2022-04-04 NOTE — Progress Notes (Signed)
Physical Therapy Treatment Patient Details Name: Phillip [name] MRN: [id number] DOB: 1945/12/12 Today's Date: 04/04/2022   History of Present Illness Pt is a 77 year old male admitted for Sepsis secondary to biliary infection and with history of S4 RCC w/ liver, pulm and brain mets.  PMHx including but not limited to anemia, CKD, renal CA, HTN, seizure, cholecystitis in 11/23 with cholecystostomy tube placed - and lap cholecystectomy 03/15/22.  A brain MRI this week confirmed increase in the size of a dominant left frontal lobe metastasis with mild worsening of edema.    PT Comments    General Comments: slow to respond.  More vocal this session.  AxO x 2 expressive aphagia/"word salad". Attempting to eat lunch but unable to open items and slow movement B UE's.   Assisted OOB to recliner required + 2 assist.  General bed mobility comments: increased, increased time.  Vernie Shanks.  Use of rail.  ModMax Asisst to complete scooting using bed pad to scoot to EOB.  Once upright, pt was able to static sit at Supervision level and support self.   General transfer comment: pt was able to rise from elevated bed with increased time + 2 side by side asisst for safety.  Slow moving.  Able to achieve upright and support self approx 20 seconds first time.  Pt sat back down then attempted again with slight inprovement.  Pt was able to rise and take a few short steps and pivot 1/4 turn to recliner. Positioned in recliner upright, opened all lunch items.   Vitals: pt was in bed on 3 lts sats 96% and HR 85.  Removed oxygen trial RA avg 94%.  HR increased to 97 with activity.  RR increased to 28. Pt will need ST Rehab at SNF to address mobility and functional decline prior to safely returning home.   Recommendations for follow up therapy are one component of a multi-disciplinary discharge planning process, led by the attending physician.  Recommendations may be updated based on patient status, additional functional  criteria and insurance authorization.  Follow Up Recommendations  Skilled nursing-short term rehab (<3 hours/day) Can patient physically be transported by private vehicle: No   Assistance Recommended at Discharge Frequent or constant Supervision/Assistance  Patient can return home with the following Two people to help with walking and/or transfers;Two people to help with bathing/dressing/bathroom;Help with stairs or ramp for entrance;Assistance with cooking/housework;Assist for transportation;Assistance with feeding;Direct supervision/assist for medications management   Equipment Recommendations  None recommended by PT    Recommendations for Other Services       Precautions / Restrictions       Mobility  Bed Mobility Overal bed mobility: Needs Assistance Bed Mobility: Supine to Sit     Supine to sit: Mod assist, Max assist     General bed mobility comments: increased, increased time.  Vernie Shanks.  Use of rail.  ModMax Asisst to complete scooting using bed pad to scoot to EOB.  Once upright, pt was able to static sit at Supervision level and support self.    Transfers Overall transfer level: Needs assistance Equipment used: Rolling walker (2 wheels) Transfers: Sit to/from Stand Sit to Stand: Min assist, Mod assist, +2 physical assistance, +2 safety/equipment           General transfer comment: pt was able to rise from elevated bed with increased time + 2 side by side asisst for safety.  Slow moving.  Able to achieve upright and support self approx 20 seconds  first time.  Pt sat back down then attempted again with slight inprovement.  Pt was able to rise and take a few short steps and pivot 1/4 turn to recliner.    Ambulation/Gait               General Gait Details: transfers only this session due to effort/fatigue/safety.   Stairs             Wheelchair Mobility    Modified Rankin (Stroke Patients Only)       Balance                                             Cognition Arousal/Alertness: Awake/alert Behavior During Therapy: Flat affect Overall Cognitive Status: No family/caregiver present to determine baseline cognitive functioning                                 General Comments: slow to respond.  More vocal this session.  AxO x 2 expressive aphagia/"word salad".        Exercises      General Comments        Pertinent Vitals/Pain      Home Living                          Prior Function            PT Goals (current goals can now be found in the care plan section) Progress towards PT goals: Progressing toward goals    Frequency    Min 2X/week      PT Plan Current plan remains appropriate    Co-evaluation              AM-PAC PT "6 Clicks" Mobility   Outcome Measure  Help needed turning from your back to your side while in a flat bed without using bedrails?: A Lot Help needed moving from lying on your back to sitting on the side of a flat bed without using bedrails?: A Lot Help needed moving to and from a bed to a chair (including a wheelchair)?: A Lot Help needed standing up from a chair using your arms (e.g., wheelchair or bedside chair)?: A Lot Help needed to walk in hospital room?: A Lot Help needed climbing 3-5 steps with a railing? : Total 6 Click Score: 11    End of Session Equipment Utilized During Treatment: Gait belt Activity Tolerance: Patient limited by lethargy;Patient limited by fatigue Patient left: in chair;with chair alarm set;with call bell/phone within reach Nurse Communication: Mobility status PT Visit Diagnosis: Other abnormalities of gait and mobility (R26.89);Muscle weakness (generalized) (M62.81)     Time: 1202-1228 PT Time Calculation (min) (ACUTE ONLY): 26 min  Charges:  $Therapeutic Activity: 23-37 mins                     Rica Koyanagi  PTA Acute  Rehabilitation Services Office M-F          [phone] Weekend  pager [phone]

## 2022-04-04 NOTE — Progress Notes (Signed)
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  He was initially worked up for possible sepsis from biliary infection but HIDA scan was negative.  General surgery and IR consulted for possible abscess formation but think it is more consistent with postsurgical changes at this time.  He has been treated empirically with cefepime and Flagyl with improvement in his white blood cell count.    Assessment & Plan:   Principal Problem:   Sepsis ([place]) Active Problems:   Renal cell carcinoma ([name])   HTN (hypertension)   Seizure disorder ([name])   Hypercalcemia   Normocytic anemia   1-Sepsis Leukocytosis Presume for intra-abdominal infection -Patient presented with fever, leukocytosis, concern for biliary infection initially.  CT show a small gas collection in the gallbladder fossa after cholecystectomy with possible abscess formation.  HIDA scan was negative.  Surgery and IR do not think there was enough fluid for aspiration.  Surgery did not think there was concern for infection in the gallbladder fossa.  Blood cultures urine culture negative ,  viral panel negative -Continue with IV cefepime and Flagyl -   2-Acute metabolic encephalopathy Patient was noted to be more confused than baseline. Status somewhat improved after he was a started on IV antibiotics Worsening mental status multifactorial in the setting of brain metastasis, sepsis. 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 On IV Decadron  by oncology Continue with Keppra Dr [name] recommending hospice care.   4-Urinary retention Gross hematuria AKI resolved Gross hematuria  after catheter placement. He is leaking through the catheter, plan to do voiding trial.   5-Hypophosphatemia replaced IV  Normocytic anemia Hb stable.   Hypokalemia; replete orally.   Estimated body mass index is 31.38 kg/m as calculated from the following:   Height as of this encounter: '5\' 11"'$  (1.803 m).   Weight as of this encounter: 102.1 kg.   DVT prophylaxis: SCD Code Status: DNR Family Communication: Disposition Plan:  Status is: Inpatient Remains inpatient appropriate because: management of confusion     Consultants:  General Sx Oncology   Procedures:    Antimicrobials:    Subjective: He is alert, able to answer yes and no to some questions.   Objective: Vitals:   04/03/22 0459 04/03/22 1359 04/03/22 2023 04/04/22 0347  BP: (!) 148/88 137/75 (!) 150/64 (!) 142/78  Pulse: 94 100 76 81  Resp: '18  18 20  '$ Temp: 98.9 F (37.2 C) 99.3 F (37.4 C) 97.9 F (36.6 C) 98.4 F (36.9 C)  TempSrc: Oral Oral Oral Oral  SpO2: 98% 97% 100% 99%  Weight:      Height:        Intake/Output Summary (Last 24 hours) at 04/04/2022 1031 Last data filed at 04/04/2022 0403 Gross per 24 hour  Intake 2311.5 ml  Output 1202 ml  Net 1109.5 ml   Filed Weights   03/30/22 1029  Weight: 102.1 kg    Examination:  General exam: Appears calm and comfortable  Respiratory system: Clear to auscultation. Respiratory effort normal. Cardiovascular system: S1 & S2 heard, RRR.  Gastrointestinal system: Abdomen is nondistended, soft and nontender. No organomegaly or masses felt. Normal bowel sounds heard. Central nervous system: Alert  Extremities: no edema  Data Reviewed: I have personally reviewed following labs and imaging studies  CBC: Recent Labs  Lab 03/29/22 1031 03/30/22 1138 03/31/22 0417 04/01/22 0322 04/02/22 0257  04/02/22 1146 04/03/22 0040 04/03/22 1239 04/04/22 0415  WBC 16.4* 13.8*   < > 24.8* 15.7* 13.3* 17.5* 12.7* 16.8*  NEUTROABS 12.7* 10.3*  --  20.8* 12.6*  --   --   --   --   HGB 9.6* 9.6*   < > 9.0* 7.8* 7.7* 8.6* 8.1* 8.5*  HCT 31.8* 33.7*   < > 30.4* 26.8* 26.5* 29.3* 28.0* 29.1*  MCV 83.9 88.0   < > 85.9 87.9 87.2 86.7 87.8 87.1  PLT 623* 530*   < > 503* 390 335 444* 376 430*   < > = values in this interval not displayed.   Basic Metabolic Panel: Recent Labs  Lab 04/01/22 0322 04/02/22 0257 04/02/22 1146 04/03/22 0040 04/04/22 0415  NA 139 139 141 139 141  K 4.3 3.7 4.0 3.7 3.4*  CL 107 111 113* 109 109  CO2 19* 20* 20* 23 23  GLUCOSE 166* 150* 187* 180* 149*  BUN 36* 24* '20 18 19  '$ CREATININE 1.27* 0.60* 0.60* 0.52* 0.60*  CALCIUM 9.1 8.5* 8.4* 8.6* 8.2*  MG  --  2.2  --  2.2 2.1  PHOS  --  1.8*  --  1.9* 2.3*   GFR: Estimated Creatinine Clearance: 94.1 mL/min (A) (by C-G formula based on SCr of 0.6 mg/dL (L)). Liver Function Tests: Recent Labs  Lab 03/31/22 0417 04/01/22 0322 04/02/22 0257 04/03/22 0040 04/04/22 0415  AST 36 '16 18 18 16  '$ ALT 77* 48* 35 34 27  ALKPHOS 182* 125 110 121 108  BILITOT 0.6 0.2* 0.5 0.5 0.3  PROT 6.7 5.9* 5.6* 6.4* 6.2*  ALBUMIN 2.5* 2.3* 2.0* 2.5* 2.4*   Recent Labs  Lab 03/30/22 1138  LIPASE 29   No results for input(s): "AMMONIA" in the last 168 hours. Coagulation Profile: Recent Labs  Lab 03/30/22 1138 03/31/22 0417  INR 1.3* 1.3*   Cardiac Enzymes: No results for input(s): "CKTOTAL", "CKMB", "CKMBINDEX", "TROPONINI" in the last 168 hours. BNP (last 3 results) No results for input(s): "PROBNP" in the last 8760 hours. HbA1C: No results for input(s): "HGBA1C" in the last 72 hours. CBG: No results for input(s): "GLUCAP" in the last 168 hours. Lipid Profile: No results for input(s): "CHOL", "HDL", "LDLCALC", "TRIG", "CHOLHDL", "LDLDIRECT" in the last 72 hours. Thyroid Function Tests: No results for input(s):  "TSH", "T4TOTAL", "FREET4", "T3FREE", "THYROIDAB" in the last 72 hours. Anemia Panel: No results for input(s): "VITAMINB12", "FOLATE", "FERRITIN", "TIBC", "IRON", "RETICCTPCT" in the last 72 hours. Sepsis Labs: Recent Labs  Lab 03/30/22 1138 03/31/22 0417  PROCALCITON  --  0.38  LATICACIDVEN 1.7  --     Recent Results (from the past 240 hour(s))  Blood Culture (routine x 2)     Status: None   Collection Time: 03/30/22 10:48 AM   Specimen: BLOOD LEFT FOREARM  Result Value Ref Range Status   Specimen Description BLOOD LEFT FOREARM  Final   Special Requests   Final    BOTTLES DRAWN AEROBIC AND ANAEROBIC Blood Culture adequate volume   Culture   Final    NO GROWTH 5 DAYS Performed at [facility], [address], [place], [place] [address]  Report Status 04/04/2022 FINAL  Final  Blood Culture (routine x 2)     Status: None   Collection Time: 03/30/22 11:10 AM   Specimen: BLOOD  Result Value Ref Range Status   Specimen Description BLOOD SITE NOT SPECIFIED  Final   Special Requests   Final    BOTTLES DRAWN AEROBIC AND ANAEROBIC Blood Culture adequate volume   Culture   Final    NO GROWTH 5 DAYS Performed at [facility], [address] [place], [place] [address]    Report Status 04/04/2022 FINAL  Final  Resp panel by RT-PCR (RSV, Flu A&B, Covid) Anterior Nasal Swab     Status: None   Collection Time: 03/30/22 11:43 AM   Specimen: Anterior Nasal Swab  Result Value Ref Range Status   SARS Coronavirus 2 by RT PCR NEGATIVE NEGATIVE Final    Comment: (NOTE) SARS-CoV-2 target nucleic acids are NOT DETECTED.  The SARS-CoV-2 RNA is generally detectable in upper respiratory specimens during the acute phase of infection. The lowest concentration of SARS-CoV-2 viral copies this assay can detect is 138 copies/mL. A negative result does not preclude SARS-Cov-2 infection and should not be used as the sole basis for treatment or other patient management decisions. A  negative result may occur with  improper specimen collection/handling, submission of specimen other than nasopharyngeal swab, presence of viral mutation(s) within the areas targeted by this assay, and inadequate number of viral copies(<138 copies/mL). A negative result must be combined with clinical observations, patient history, and epidemiological information. The expected result is Negative.  Fact Sheet for Patients:  EntrepreneurPulse.com.au  Fact Sheet for Healthcare Providers:  IncredibleEmployment.be  This test is no t yet approved or cleared by the [place] FDA and  has been authorized for detection and/or diagnosis of SARS-CoV-2 by FDA under an Emergency Use Authorization (EUA). This EUA will remain  in effect (meaning this test can be used) for the duration of the COVID-19 declaration under Section 564(b)(1) of the Act, 21 U.S.C.section 360bbb-3(b)(1), unless the authorization is terminated  or revoked sooner.       Influenza A by PCR NEGATIVE NEGATIVE Final   Influenza B by PCR NEGATIVE NEGATIVE Final    Comment: (NOTE) The Xpert Xpress SARS-CoV-2/FLU/RSV plus assay is intended as an aid in the diagnosis of influenza from Nasopharyngeal swab specimens and should not be used as a sole basis for treatment. Nasal washings and aspirates are unacceptable for Xpert Xpress SARS-CoV-2/FLU/RSV testing.  Fact Sheet for Patients: EntrepreneurPulse.com.au  Fact Sheet for Healthcare Providers: IncredibleEmployment.be  This test is not yet approved or cleared by the [place] FDA and has been authorized for detection and/or diagnosis of SARS-CoV-2 by FDA under an Emergency Use Authorization (EUA). This EUA will remain in effect (meaning this test can be used) for the duration of the COVID-19 declaration under Section 564(b)(1) of the Act, 21 U.S.C. section 360bbb-3(b)(1), unless the authorization  is terminated or revoked.     Resp Syncytial Virus by PCR NEGATIVE NEGATIVE Final    Comment: (NOTE) Fact Sheet for Patients: EntrepreneurPulse.com.au  Fact Sheet for Healthcare Providers: IncredibleEmployment.be  This test is not yet approved or cleared by the [place] FDA and has been authorized for detection and/or diagnosis of SARS-CoV-2 by FDA under an Emergency Use Authorization (EUA). This EUA will remain in effect (meaning this test can be used) for the duration of the COVID-19 declaration under Section 564(b)(1) of the Act, 21 U.S.C. section 360bbb-3(b)(1), unless the authorization  is terminated or revoked.  Performed at [name], [place] [address], [place], [place] [address]          Radiology Studies: No results found.      Scheduled Meds:  Chlorhexidine Gluconate Cloth  6 each Topical Daily   dexamethasone (DECADRON) injection  4 mg Intravenous Daily   mouth rinse  15 mL Mouth Rinse 4 times per day   oxyCODONE  10-20 mg Oral Daily   oxyCODONE  20 mg Oral QHS   potassium chloride  40 mEq Oral Once   testosterone  5 g Transdermal Daily   Continuous Infusions:  sodium chloride 100 mL/hr at 04/04/22 0028   ceFEPime (MAXIPIME) IV 2 g (04/04/22 0177)   levETIRAcetam 250 mg (04/04/22 0629)   metronidazole 500 mg (04/03/22 2207)     LOS: 5 days    Time spent: 35 minutes    Sadeen Wiegel A Millie Shorb, MD [facility]   If 7PM-7AM, please contact night-coverage [email or web address]  04/04/2022, 10:31 AM

## 2022-04-04 NOTE — Progress Notes (Signed)
IP PROGRESS NOTE  Subjective:   Phillip. Holtsclaw is alert this morning.  He appears comfortable.  No complaint.    Objective: Vital signs in last 24 hours: Blood pressure 121/68, pulse 90, temperature 98.4 F (36.9 C), temperature source Oral, resp. rate 16, height '5\' 11"'$  (1.803 m), weight 225 lb (102.1 kg), SpO2 98 %.  Intake/Output from previous day: 02/06 0701 - 02/07 0700 In: 3902 [P.O.:596; I.V.:2803.5; IV Piggyback:502.5] Out: 1202 [Urine:1202]  Physical Exam:  HEENT: The tongue is dry,, no thrush Lungs: Clear anteriorly, no respiratory distress Cardiac: Regular rate and rhythm Abdomen: Mildly distended, healed surgical incisions, firmness with associated tenderness in the left lower abdomen, tenderness in the right lower abdomen Extremities: No leg edema Neurologic: Alert, moves all extremities to command, speaks a few words, not in sentences  skin: Minimal erythema of the anterior chest  Portacath/PICC-without erythema  Lab Results: Recent Labs    04/03/22 1239 04/04/22 0415  WBC 12.7* 16.8*  HGB 8.1* 8.5*  HCT 28.0* 29.1*  PLT 376 430*    BMET Recent Labs    04/03/22 0040 04/04/22 0415  NA 139 141  K 3.7 3.4*  CL 109 109  CO2 23 23  GLUCOSE 180* 149*  BUN 18 19  CREATININE 0.52* 0.60*  CALCIUM 8.6* 8.2*    Lab Results  Component Value Date   CEA1 1.77 01/04/2016    Studies/Results: No results found.  Medications: I have reviewed the patient's current medications.  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, L4 nerve compression Biopsy of the L4 mass 11/18/2015 confirmed metastatic renal cell carcinoma, clear cell type CTs of the chest, abdomen, and pelvis 11/18/2015-right lower lobe nodule, expansile lytic lesion at the right 11th rib/costal vertebral junction, left renal mass, expansile lesion involving the L4 vertebra, lytic lesion at the left acetabulum, and a low-attenuation liver  lesion Initiation of SRS to L4 12/02/2015, Completed 12/12/2015 Initiation of Pazopanib 12/30/2015 Pazopanib placed on hold 02/06/2016 secondary to elevated liver enzymes Pazopanib resumed 03/07/2016 at a dose of 400 mg daily  Pazopanib discontinued 03/19/2016 secondary to elevated liver enzymes Restaging CTs 04/02/2016-stable left renal mass, decreased soft tissue component associated with the L4 metastasis, increased soft tissue component associated with the right 11th rib metastasis with increased T11 bony destruction, increased sclerosis at the left acetabulum lesion Cycle 1 nivolumab 04/12/2016 Cycle 2 nivolumab 04/26/2016 Cycle 3 nivolumab 05/11/2016 Cycle 4 nivolumab 05/24/2016 Cycle 5 nivolumab 06/08/2016 MRI lumbar spine 06/21/2016-unchanged tumor at L3, increased size of retroperitoneal lymph nodes compared to a CT from 04/02/2016 Cycle 6 nivolumab  06/22/2016 CTs chest, abdomen, and pelvis 07/04/2016-enlargement of the left renal mass, right adrenal nodule, left hilar and peritoneal lymph nodes, enlargement of left acetabular lesion. Stable lung nodules. Cycle 7 nivolumab 07/06/2016 Cycle 8 nivolumab 07/20/2016 Cycle 9 nivolumab 08/02/2016 Cycle 10 nivolumab 08/20/2016 Restaging CT 09/03/2016 evaluation with stable disease Cycle 11 nivolumab 09/05/2016 Cycle 12 nivolumab 09/19/2016 Cycle 13 nivolumab 10/03/2016 Cycle 14 nivolumab 10/17/2016 Cycle 15 nivolumab 10/31/2016 Cycle 16 nivolumab 11/14/2016 (changed to monthly schedule) Cycle 17 nivolumab 12/19/2016 CTs 01/21/2017-increased left renal mass, increased size of adrenal metastases, increased lytic bone lesions, increased left lung nodule, persistent tumor at L4 with probable epidural component Initiation of Cabozantinib 01/28/2017 Restaging CTs 05/30/2017- decreased size of left hilar mass, left renal mass, retroperitoneal adenopathy, and adrenal metastasis.  Healing bone lesions. Cabozantinib continued CTs 10/21/2017-  interval enlargement left hilar lymph node; stable rib lesions; stable mass left renal cortex; stable  mildly nodular adrenal glands; stable lytic lesions within the pelvis and spine. Cabozantinib continued CTs 02/21/2018- enlargement of an AP window lymph node.  Mildly enlarged left hilar lymph node is unchanged.  Primary renal cell carcinoma involving the upper pole of the left kidney appears similar.  Stable enlarged right periaortic lymph node adjacent to the renal vessels.  Stable multifocal bony metastatic disease. Cabozantinib continued CTs 07/29/2018- stable 15 mm AP window nodes; stable left hilar node; subcarinal node slightly larger; right periaortic node 16 mm, previously 12 mm; portacaval node 24 mm, previously 16 mm; 15 mm node superior to the pancreatic head has enlarged; interval increase nodularity of both adrenal glands; stable left kidney mass; multifocal bony metastatic disease not significantly changed. Cabozantinib continued CTs 11/06/2018- moderate improvement in thoracic adenopathy; minimal improvement abdominal adenopathy; left kidney upper pole mass and various lytic expansile bone lesions stable; mild increase in nodularity of left adrenal gland; right adrenal gland nodularity stable. 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  Peripheral enhancing masses consistent with metastases. No hemorrhage. MRI brain 03/11/2019-7 enhancing brain masses consistent with metastatic disease SRS to 7 brain lesions, treatment given 03/19/2019, 03/23/2019, and 03/25/2019 CTs 04/09/2019-decrease in left renal mass, bilateral adrenal nodules, AP window and porta hepatic adenopathy.  New 9 mm right lower lobe nodule.  Improved lytic lesion of the left acetabulum.  Other bone lesions are stable. Cabozantinib continued MRI brain 07/17/2019-resolution of 4 mm treated lesion in the right frontal  cortex, 6 remaining treated lesions have decreased in size, new punctate metastasis in the superior right cerebellum SRS to right cerebellar lesion 07/30/2019 CTs 08/17/2019-previously noted right lower lobe nodule resolved, stable left kidney mass, mixed lytic/sclerotic bone lesions in the thoracolumbar spine, right posterior ribs, and left acetabulum-unchanged, no evidence of progressive disease  Cabozantinib continued MRI brain 10/23/2019-stable to slight decrease in size of multiple enhancing intracranial lesions.  Slight increase in surrounding edema in the medial left frontal lobe.  No new lesions present. MRI brain 01/29/2020-multiple enhancing brain lesions, some hemorrhagic, some with mild enlargement-treatment effect? CTs 02/08/2020-no thoracic metastases, enlargement of the left renal mass, enlargement of lytic lesion at T9, other lytic lesions unchanged MRI brain 05/04/2020-no new lesions, slight enlargement of a right occipital lesion, other lesions are stable or decreased in size CTs 08/11/2020-enlargement of left kidney mass, new 1.6 cm segment 7 liver lesion, enlargement of a lytic lesion at T9, increased lysis of a sclerotic lesion at the right second rib, 0.7 cm endoluminal nodule at the bladder dome-enlarged Brain MRI 08/10/2020-slight increase in a 15 mm left frontal lobe lesion, new punctate focus in the right occipital cortex, other lesions stable or slightly decreased CTs 11/01/2020-increase in left suprahilar opacity, enlargement of previous liver metastases, several new subcentimeter lesions, increase in left renal mass, similar appearance of bone metastases, stable hyperdense/enhancing nodule in the left bladder Brain MRI 11/04/2020-no change in brain metastases Lenvatinib/pembrolizumab 11/30/2020 CTs 03/03/2021-stable left hilar lymph nodes; resolution of left upper lobe perihilar nodularity; unchanged for millimeter peripheral right upper lobe nodule; multiple lytic bone lesions appear  stable; left kidney lesion decreased in size; bilateral adrenal nodules decreased in size; several liver lesion showed mild increase in size; several new small lesions within the right hepatic lobe. Lenvatinib/pembrolizumab continued 03/07/2021 Brain MRI 03/10/2021-progression of a left frontal metastasis, tiny new metastasis in the right frontal lobe-referred for SBRT to the right frontal lesion and to [facility] to consider  a LITT procedure for the progressive left frontal lesion SRS 04/06/2021, LITT procedure at [facility] 04/18/2021-pathology metastatic renal cell carcinoma in background of necrosis/gliosis Lenvatinib on hold Pembrolizumab held 04/24/2021 Pembrolizumab 05/01/2021, lenvatinib remains on hold pending surgical evaluation Pembrolizumab 05/25/2021, lenvatinib resumed Pembrolizumab 06/15/2021 Lenvima resumed 06/21/2021 CTs 07/28/2021-continued regression left renal lesion.  Much improved appearance of the liver.  Continued regression of bilateral adrenal gland nodules.  Stable mixed lytic and sclerotic metastatic bone disease.  No new or progressive findings. Pembrolizumab 07/31/2021, lenvatinib continued Pembrolizumab held 08/21/2021 due to generalized weakness Pembrolizumab resumed 09/11/2021 MRI brain 10/19/2021-"new "focus of minimal enhancement of the right frontal lobe, larger punctate right cerebellar lesion, review of MRI found the right frontal lesion was present in 2021 and was treated with SRS CTs 11/12/2021-2 new right upper lung nodules, stable left renal lesion, enlarging lytic lesion in the left fourth rib Pembrolizumab and lenvatinib continued Brain 01/02/2022-new 4 mm lesion in the right parietal cortex the largest lesion in left frontal lobe stable in size with more solid  CTs chest, abdomen, and pelvis 01/08/2022-increased soft tissue component of a lytic left acetabular  metastasis and increased sclerotic component of the left fourth rib metastasis, decreased conspicuity of right upper  lobe nodules, no change in renal mass Pembrolizumab and lenvatinib continued MRI brain 03/27/2022-minimal increase size of a 3 mm right cerebellar lesion, increased size of enhancing mass at the left frontal lobe measuring 3 x 2.2 cm with adjacent gyriform enhancement and mild worsening of edema in the left frontal white matter CTs 03/30/2022-new pulmonary nodules, small gas collection in the gallbladder fossa, increased left acetabular metastasis   Pain secondary to #1-managed by Dr. Lovenia Shuck.  Improved Hypertension Elevated transaminases 02/06/2016- Pazopanib placed on hold Liver enzymes normal 03/07/2016 Port-A-Cath placement 05/16/2016 Malaise/anorexia 09/05/2016. Cortisol and testosterone levels low. Hydrocortisone and testosterone replacement initiated. Conjunctival/scleral erythema 09/19/2016-resolved with steroid eyedrops Proximal right leg weakness. 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 Hypercalcemia status post Zometa 01/23/2017-resolved Hypercalcemia 03/29/2022-Zometa Pruritic rash following IV contrast 10/21/2017; rash following IV contrast 02/21/2018 despite prednisone/Benadryl premedication Brief episodes of expressive aphasia October and December 2020 Vitamin B12 deficiency confirmed on lab 03/07/2021-started oral vitamin B12 replacement 03/28/2021 Admission 01/08/2022 with acute cholecystitis/sepsis, cholecystostomy tube Cholecystectomy 03/15/2022 14.  Admission 03/30/2022 with failure to thrive and fever 15.  Hematuria with urinary retention-likely secondary to bleeding from the renal mass   Phillip [name] has metastatic renal cell carcinoma.  A brain MRI this week confirmed increase in the size of a dominant left frontal lobe metastasis with mild worsening of edema.  He had hypercalcemia and was treated with Zometa on 03/29/2022.  Lenvatinib and pembrolizumab were discontinued.   He was admitted with a fever and failure to thrive.  There is no clear  source for infection.  The fever has resolved.  Cultures are negative.  A biliary scan was negative for a bile leak.  Hypercalcemia has resolved.  He has persistent leukocytosis, potentially related to infection, metastatic renal cell carcinoma, and Decadron.  He appears or alert again today and he is speaking a few words.  He remains confused.  His performance status is poor.  He does not appear to be a candidate for further treatment of the renal cell carcinoma.  I recommend hospice care.  He may benefit from skilled nursing facility placement for physical therapy prior to returning home.  I will discuss this with his wife when she is available.   Recommendations: Continue broad-spectrum  intravenous antibiotics, follow-up cultures Continue Decadron changed to oral dosing Diet as recommended by speech therapy OxyContin/oxycodone for pain Physical therapy Skilled nursing facility placement Will discuss a hospice referral with his wife     LOS: 5 days   Betsy Coder, MD   04/04/2022, 2:06 PM

## 2022-04-04 NOTE — Progress Notes (Signed)
Patient foley leaking doctor ordered to remove foley and do trial to see if patient will urinate on his own; will have night shift reevaluate

## 2022-04-04 NOTE — Care Management Important Message (Signed)
Important Message  Patient Details IM Letter given. Name: Phillip [name] MRN: [id number] Date of Birth: 01/31/1946   Medicare Important Message Given:  Yes     [name] 04/04/2022, 11:26 AM

## 2022-04-04 NOTE — TOC Progression Note (Signed)
Transition of Care [facility]) - Progression Note    Patient Details  Name: Phillip [name] MRN: [id number] Date of Birth: 02/05/1946  [facility]) CM/SW [place], LCSW Phone Number: 04/04/2022, 10:04 AM  Clinical Narrative:    CSW spoke to [place] with [facility] they have declined pt as beds are full. CSW spoke with drew from [facility], they have declined pt as well.   CSW spoke with pt's spouse and presented bed offers, pt 's spouse would like to review . TOC to follow.     Expected Discharge Plan: [place] Barriers to Discharge: Continued Medical Work up  Expected Discharge Plan and Services       Living arrangements for the past 2 months: Single Family Home                                       Social Determinants of Health (SDOH) Interventions [place]: No Food Insecurity (03/30/2022)  Housing: Low Risk  (03/30/2022)  Transportation Needs: No Transportation Needs (03/30/2022)  Utilities: Not At Risk (03/30/2022)  Tobacco Use: Low Risk  (03/30/2022)    Readmission Risk Interventions    01/09/2022    1:14 PM  Readmission Risk Prevention Plan  Transportation Screening Complete  PCP or Specialist Appt within 3-5 Days Complete  Social Work Consult for [place] Planning/Counseling Complete  Palliative Care Screening Complete  Medication Review Press photographer) Complete

## 2022-04-05 ENCOUNTER — Inpatient Hospital Stay (HOSPITAL_COMMUNITY): Payer: PPO

## 2022-04-05 ENCOUNTER — Other Ambulatory Visit (HOSPITAL_COMMUNITY): Payer: Self-pay

## 2022-04-05 DIAGNOSIS — C649 Malignant neoplasm of unspecified kidney, except renal pelvis: Secondary | ICD-10-CM | POA: Diagnosis not present

## 2022-04-05 DIAGNOSIS — A419 Sepsis, unspecified organism: Secondary | ICD-10-CM | POA: Diagnosis not present

## 2022-04-05 LAB — CBC
HCT: 25.4 % — ABNORMAL LOW (ref 39.0–52.0)
Hemoglobin: 7.3 g/dL — ABNORMAL LOW (ref 13.0–17.0)
MCH: 25.2 pg — ABNORMAL LOW (ref 26.0–34.0)
MCHC: 28.7 g/dL — ABNORMAL LOW (ref 30.0–36.0)
MCV: 87.6 fL (ref 80.0–100.0)
Platelets: 253 10*3/uL (ref 150–400)
RBC: 2.9 MIL/uL — ABNORMAL LOW (ref 4.22–5.81)
RDW: 18.6 % — ABNORMAL HIGH (ref 11.5–15.5)
WBC: 14.6 10*3/uL — ABNORMAL HIGH (ref 4.0–10.5)
nRBC: 0 % (ref 0.0–0.2)

## 2022-04-05 LAB — BASIC METABOLIC PANEL
Anion gap: 14 (ref 5–15)
BUN: 42 mg/dL — ABNORMAL HIGH (ref 8–23)
CO2: 18 mmol/L — ABNORMAL LOW (ref 22–32)
Calcium: 7.4 mg/dL — ABNORMAL LOW (ref 8.9–10.3)
Chloride: 106 mmol/L (ref 98–111)
Creatinine, Ser: 1.76 mg/dL — ABNORMAL HIGH (ref 0.61–1.24)
GFR, Estimated: 39 mL/min — ABNORMAL LOW (ref >60)
Glucose, Bld: 167 mg/dL — ABNORMAL HIGH (ref 70–99)
Potassium: 3.7 mmol/L (ref 3.5–5.1)
Sodium: 138 mmol/L (ref 135–145)

## 2022-04-05 LAB — MAGNESIUM: Magnesium: 1.9 mg/dL (ref 1.7–2.4)

## 2022-04-05 MED ORDER — SODIUM CHLORIDE 0.9 % IV SOLN
2.0000 g | Freq: Two times a day (BID) | INTRAVENOUS | Status: DC
  Administered 2022-04-05 – 2022-04-06 (×2): 2 g via INTRAVENOUS
  Filled 2022-04-05 (×3): qty 12.5

## 2022-04-05 MED ORDER — LEVETIRACETAM 250 MG PO TABS
250.0000 mg | ORAL_TABLET | Freq: Two times a day (BID) | ORAL | Status: DC
  Administered 2022-04-05 – 2022-04-30 (×50): 250 mg via ORAL
  Filled 2022-04-05 (×50): qty 1

## 2022-04-05 NOTE — TOC Progression Note (Addendum)
Transition of Care [facility]) - Progression Note    Patient Details  Name: Phillip [name] MRN: [id number] Date of Birth: 1946/02/15  Transition [facility]) CM/SW [place], LCSW Phone Number: 04/05/2022, 10:09 AM  Clinical Narrative:     CSW received a call from pt's spouse, [name], she reported her concerns with pt's bed offers. She stated, "he cannot go to a 1 or 2-star facility." CSW explained to pt's spouse she will need to choose from one of the 9 facilities that offer her a bed, or she may have to take pt home. Pt's spouse reported she wants to try a facility in [place]. CSW asked for the facility name. pt's spouse stated she would call CSW back. TOC to follow.  [place] 12:30pm  CSW received a call from pt's wife requesting pt's information be sent to [place] [facility], also requesting all facilities that have declined the pt. CSW informed pt's spouse  [facility] P[facility] is considering pt, however, he cannot have cancer treatment while in the facility. Per pt's wife pt's cancer treatment will stop while he is in rehab. CSW reached out to [place] admission no answer left a HIPAA complaint VM, requesting a return call. Pt's wife reported she is heading to the hospital with a list of 4 and 5-star facilities and would like pt's information sent out to the facilities on the list. Pt's wife reported she is willing to pay out of pocket for a higher-rated facility if insurance does not cover it. TOC to follow.  2:16pm CSW spoke with pt's spouse and received a list of facilities she would like pt's information sent out to.  [facility]- declined, no beds  [facility] -out of network [facility]-declined,can't meet needs [facility]- declined can't meet pt's needs [facility]-pending  [facility]-declined,no beds full [facility]-pending left VM [facility]-pending left VM [facility]-pending left VM [facility]-no answer  [facility]-pending  left VM [facility] -is an retirement community  TOC to follow  Expected Discharge Plan: [place] Barriers to Discharge: Continued Medical Work up  Expected Discharge Plan and [place] arrangements for the past 2 months: Single Family Home                                       Social Determinants of Health (SDOH) Interventions [place]: No Food Insecurity (03/30/2022)  Housing: Low Risk  (03/30/2022)  Transportation Needs: No Transportation Needs (03/30/2022)  Utilities: Not At Risk (03/30/2022)  Tobacco Use: Low Risk  (03/30/2022)    Readmission Risk Interventions    01/09/2022    1:14 PM  Readmission Risk Prevention Plan  Transportation Screening Complete  PCP or Specialist Appt within 3-5 Days Complete  Social Work Consult for [place] Planning/Counseling Complete  Palliative Care Screening Complete  Medication Review Press photographer) Complete

## 2022-04-05 NOTE — Progress Notes (Signed)
PHARMACY NOTE:  ANTIMICROBIAL RENAL DOSAGE ADJUSTMENT  Current antimicrobial regimen includes a mismatch between antimicrobial dosage and estimated renal function. As per policy approved by the Pharmacy & Therapeutics and Medical Executive Committees, the antimicrobial dosage will be adjusted accordingly.  Current antimicrobial and dosage:  Cefepime 2g q8 hr  Indication: sepsis unknown source  Renal Function:   Estimated Creatinine Clearance: 42.8 mL/min (A) (by C-G formula based on SCr of 1.76 mg/dL (H)). '[]'$      On intermittent HD, scheduled: '[]'$      On CRRT    Antimicrobial dosage has been changed to:  2g IV q12 hr   Additional Comments: n/a   Thank you for allowing pharmacy to be a part of this patient's care.  Reuel Boom, PharmD, BCPS [phone] 04/05/2022, 11:21 AM

## 2022-04-05 NOTE — Progress Notes (Signed)
Notified on call provider that RN had bladder scanned patient and patient had 570 mL in the bladder due to no urinary output since the foley had been removed. Made on call provider aware that patient had a foley at one point but it was removed during dayshift due to leaking and the doctor wanted to try a voiding trial. Since there was no order to reinsert the foley or order for In and Out caths, on call provider put in an order to In and Out cath every 8 hours PRN. RN performed an In and Out cath on patient and obtained 800 mL of urinary output. Re-scanned patient's bladder and bladder scanner showed 6 mL. Will continue to monitor and recheck as needed.

## 2022-04-05 NOTE — Progress Notes (Addendum)
IP PROGRESS NOTE  Subjective:   Mr. Wafer is alert.  The bedside RN reports he developed urinary retention last night requiring catheterization.    Objective: Vital signs in last 24 hours: Blood pressure 136/67, pulse 93, temperature 97.7 F (36.5 C), temperature source Oral, resp. rate (!) 22, height '5\' 11"'$  (1.803 m), weight 225 lb (102.1 kg), SpO2 98 %.  Intake/Output from previous day: 02/07 0701 - 02/08 0700 In: 2811 [P.O.:420; I.V.:1782; IV Piggyback:609] Out: 807 [Urine:807]  Physical Exam:  HEENT: The tongue is dry,, no thrush Lungs: Clear anteriorly, no respiratory distress Cardiac: Regular rate and rhythm Abdomen: Distended in the lower abdomen with tenderness  Extremities: Bilateral pedal edema Neurologic: Alert, moves all extremities to command, speaks a few words skin: Minimal erythema of the anterior chest  Portacath/PICC-without erythema  Lab Results: Recent Labs    04/04/22 0415 04/05/22 0323  WBC 16.8* 14.6*  HGB 8.5* 7.3*  HCT 29.1* 25.4*  PLT 430* 253    BMET Recent Labs    04/04/22 0415 04/05/22 0323  NA 141 138  K 3.4* 3.7  CL 109 106  CO2 23 18*  GLUCOSE 149* 167*  BUN 19 42*  CREATININE 0.60* 1.76*  CALCIUM 8.2* 7.4*    Lab Results  Component Value Date   CEA1 1.77 01/04/2016    Studies/Results: DG Chest 2 View  Result Date: 04/05/2022 CLINICAL DATA:  Leukocytosis EXAM: CHEST - 2 VIEW COMPARISON:  03/30/2022 FINDINGS: Port in the anterior chest wall with tip in distal SVC. Normal mediastinum and cardiac silhouette. Normal pulmonary vasculature. No evidence of effusion, infiltrate, or pneumothorax. No acute bony abnormality. IMPRESSION: No acute cardiopulmonary process. Electronically Signed   By: Suzy Bouchard M.D.   On: 04/05/2022 13:14    Medications: I have reviewed the patient's current medications.  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, L4 nerve compression Biopsy of  the L4 mass 11/18/2015 confirmed metastatic renal cell carcinoma, clear cell type CTs of the chest, abdomen, and pelvis 11/18/2015-right lower lobe nodule, expansile lytic lesion at the right 11th rib/costal vertebral junction, left renal mass, expansile lesion involving the L4 vertebra, lytic lesion at the left acetabulum, and a low-attenuation liver lesion Initiation of SRS to L4 12/02/2015, Completed 12/12/2015 Initiation of Pazopanib 12/30/2015 Pazopanib placed on hold 02/06/2016 secondary to elevated liver enzymes Pazopanib resumed 03/07/2016 at a dose of 400 mg daily  Pazopanib discontinued 03/19/2016 secondary to elevated liver enzymes Restaging CTs 04/02/2016-stable left renal mass, decreased soft tissue component associated with the L4 metastasis, increased soft tissue component associated with the right 11th rib metastasis with increased T11 bony destruction, increased sclerosis at the left acetabulum lesion Cycle 1 nivolumab 04/12/2016 Cycle 2 nivolumab 04/26/2016 Cycle 3 nivolumab 05/11/2016 Cycle 4 nivolumab 05/24/2016 Cycle 5 nivolumab 06/08/2016 MRI lumbar spine 06/21/2016-unchanged tumor at L3, increased size of retroperitoneal lymph nodes compared to a CT from 04/02/2016 Cycle 6 nivolumab  06/22/2016 CTs chest, abdomen, and pelvis 07/04/2016-enlargement of the left renal mass, right adrenal nodule, left hilar and peritoneal lymph nodes, enlargement of left acetabular lesion. Stable lung nodules. Cycle 7 nivolumab 07/06/2016 Cycle 8 nivolumab 07/20/2016 Cycle 9 nivolumab 08/02/2016 Cycle 10 nivolumab 08/20/2016 Restaging CT 09/03/2016 evaluation with stable disease Cycle 11 nivolumab 09/05/2016 Cycle 12 nivolumab 09/19/2016 Cycle 13 nivolumab 10/03/2016 Cycle 14 nivolumab 10/17/2016 Cycle 15 nivolumab 10/31/2016 Cycle 16 nivolumab 11/14/2016 (changed to monthly schedule) Cycle 17 nivolumab 12/19/2016 CTs 01/21/2017-increased left renal mass, increased size of adrenal  metastases, increased lytic  bone lesions, increased left lung nodule, persistent tumor at L4 with probable epidural component Initiation of Cabozantinib 01/28/2017 Restaging CTs 05/30/2017- decreased size of left hilar mass, left renal mass, retroperitoneal adenopathy, and adrenal metastasis.  Healing bone lesions. Cabozantinib continued CTs 10/21/2017- interval enlargement left hilar lymph node; stable rib lesions; stable mass left renal cortex; stable mildly nodular adrenal glands; stable lytic lesions within the pelvis and spine. Cabozantinib continued CTs 02/21/2018- enlargement of an AP window lymph node.  Mildly enlarged left hilar lymph node is unchanged.  Primary renal cell carcinoma involving the upper pole of the left kidney appears similar.  Stable enlarged right periaortic lymph node adjacent to the renal vessels.  Stable multifocal bony metastatic disease. Cabozantinib continued CTs 07/29/2018- stable 15 mm AP window nodes; stable left hilar node; subcarinal node slightly larger; right periaortic node 16 mm, previously 12 mm; portacaval node 24 mm, previously 16 mm; 15 mm node superior to the pancreatic head has enlarged; interval increase nodularity of both adrenal glands; stable left kidney mass; multifocal bony metastatic disease not significantly changed. Cabozantinib continued CTs 11/06/2018- moderate improvement in thoracic adenopathy; minimal improvement abdominal adenopathy; left kidney upper pole mass and various lytic expansile bone lesions stable; mild increase in nodularity of left adrenal gland; right adrenal gland nodularity stable. 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  Peripheral enhancing masses consistent with metastases. No hemorrhage. MRI brain 03/11/2019-7 enhancing brain masses consistent with metastatic disease SRS to 7 brain lesions, treatment given  03/19/2019, 03/23/2019, and 03/25/2019 CTs 04/09/2019-decrease in left renal mass, bilateral adrenal nodules, AP window and porta hepatic adenopathy.  New 9 mm right lower lobe nodule.  Improved lytic lesion of the left acetabulum.  Other bone lesions are stable. Cabozantinib continued MRI brain 07/17/2019-resolution of 4 mm treated lesion in the right frontal cortex, 6 remaining treated lesions have decreased in size, new punctate metastasis in the superior right cerebellum SRS to right cerebellar lesion 07/30/2019 CTs 08/17/2019-previously noted right lower lobe nodule resolved, stable left kidney mass, mixed lytic/sclerotic bone lesions in the thoracolumbar spine, right posterior ribs, and left acetabulum-unchanged, no evidence of progressive disease  Cabozantinib continued MRI brain 10/23/2019-stable to slight decrease in size of multiple enhancing intracranial lesions.  Slight increase in surrounding edema in the medial left frontal lobe.  No new lesions present. MRI brain 01/29/2020-multiple enhancing brain lesions, some hemorrhagic, some with mild enlargement-treatment effect? CTs 02/08/2020-no thoracic metastases, enlargement of the left renal mass, enlargement of lytic lesion at T9, other lytic lesions unchanged MRI brain 05/04/2020-no new lesions, slight enlargement of a right occipital lesion, other lesions are stable or decreased in size CTs 08/11/2020-enlargement of left kidney mass, new 1.6 cm segment 7 liver lesion, enlargement of a lytic lesion at T9, increased lysis of a sclerotic lesion at the right second rib, 0.7 cm endoluminal nodule at the bladder dome-enlarged Brain MRI 08/10/2020-slight increase in a 15 mm left frontal lobe lesion, new punctate focus in the right occipital cortex, other lesions stable or slightly decreased CTs 11/01/2020-increase in left suprahilar opacity, enlargement of previous liver metastases, several new subcentimeter lesions, increase in left renal mass, similar  appearance of bone metastases, stable hyperdense/enhancing nodule in the left bladder Brain MRI 11/04/2020-no change in brain metastases Lenvatinib/pembrolizumab 11/30/2020 CTs 03/03/2021-stable left hilar lymph nodes; resolution of left upper lobe perihilar nodularity; unchanged for millimeter peripheral right upper lobe nodule; multiple lytic bone lesions appear stable; left kidney lesion  decreased in size; bilateral adrenal nodules decreased in size; several liver lesion showed mild increase in size; several new small lesions within the right hepatic lobe. Lenvatinib/pembrolizumab continued 03/07/2021 Brain MRI 03/10/2021-progression of a left frontal metastasis, tiny new metastasis in the right frontal lobe-referred for SBRT to the right frontal lesion and to [facility] to consider a LITT procedure for the progressive left frontal lesion SRS 04/06/2021, LITT procedure at [facility] 04/18/2021-pathology metastatic renal cell carcinoma in background of necrosis/gliosis Lenvatinib on hold Pembrolizumab held 04/24/2021 Pembrolizumab 05/01/2021, lenvatinib remains on hold pending surgical evaluation Pembrolizumab 05/25/2021, lenvatinib resumed Pembrolizumab 06/15/2021 Lenvima resumed 06/21/2021 CTs 07/28/2021-continued regression left renal lesion.  Much improved appearance of the liver.  Continued regression of bilateral adrenal gland nodules.  Stable mixed lytic and sclerotic metastatic bone disease.  No new or progressive findings. Pembrolizumab 07/31/2021, lenvatinib continued Pembrolizumab held 08/21/2021 due to generalized weakness Pembrolizumab resumed 09/11/2021 MRI brain 10/19/2021-"new "focus of minimal enhancement of the right frontal lobe, larger punctate right cerebellar lesion, review of MRI found the right frontal lesion was present in 2021 and was treated with SRS CTs 11/12/2021-2 new right upper lung nodules, stable left renal lesion, enlarging lytic lesion in the left fourth rib Pembrolizumab and  lenvatinib continued Brain 01/02/2022-new 4 mm lesion in the right parietal cortex the largest lesion in left frontal lobe stable in size with more solid  CTs chest, abdomen, and pelvis 01/08/2022-increased soft tissue component of a lytic left acetabular  metastasis and increased sclerotic component of the left fourth rib metastasis, decreased conspicuity of right upper lobe nodules, no change in renal mass Pembrolizumab and lenvatinib continued MRI brain 03/27/2022-minimal increase size of a 3 mm right cerebellar lesion, increased size of enhancing mass at the left frontal lobe measuring 3 x 2.2 cm with adjacent gyriform enhancement and mild worsening of edema in the left frontal white matter CTs 03/30/2022-new pulmonary nodules, small gas collection in the gallbladder fossa, increased left acetabular metastasis   Pain secondary to #1-managed by Dr. Lovenia Shuck.  Improved Hypertension Elevated transaminases 02/06/2016- Pazopanib placed on hold Liver enzymes normal 03/07/2016 Port-A-Cath placement 05/16/2016 Malaise/anorexia 09/05/2016. Cortisol and testosterone levels low. Hydrocortisone and testosterone replacement initiated. Conjunctival/scleral erythema 09/19/2016-resolved with steroid eyedrops Proximal right leg weakness. 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 Hypercalcemia status post Zometa 01/23/2017-resolved Hypercalcemia 03/29/2022-Zometa Pruritic rash following IV contrast 10/21/2017; rash following IV contrast 02/21/2018 despite prednisone/Benadryl premedication Brief episodes of expressive aphasia October and December 2020 Vitamin B12 deficiency confirmed on lab 03/07/2021-started oral vitamin B12 replacement 03/28/2021 Admission 01/08/2022 with acute cholecystitis/sepsis, cholecystostomy tube Cholecystectomy 03/15/2022 14.  Admission 03/30/2022 with failure to thrive and fever 15.  Hematuria with urinary retention-likely secondary to bleeding from the renal  mass   Mr Ake has metastatic renal cell carcinoma.  A brain MRI this week confirmed increase in the size of a dominant left frontal lobe metastasis with mild worsening of edema.  He had hypercalcemia and was treated with Zometa on 03/29/2022.  Lenvatinib and pembrolizumab were discontinued.   He was admitted with a fever and failure to thrive.  There is no clear source for infection.  The fever has resolved.  Cultures are negative.  A biliary scan was negative for a bile leak.  Hypercalcemia has resolved.  He has persistent leukocytosis, potentially related to infection, metastatic renal cell carcinoma, and Decadron.  His renal function is worse today, likely related to urinary retention.  He appears more alert again today and he  is speaking a few words, though he remains confused.  His performance status is poor.  He does not appear to be a candidate for further treatment of the renal cell carcinoma.  I discussed the case with his wife by telephone.  She understands the poor prognosis.  I suspect his altered mental status is secondary to brain metastases/radiation and delirium from critical illness.  Oxycodone may be contributing.  Arrangements are being made for discharge to a skilled nursing facility.  She agrees with palliative care in the skilled nursing facility and she has been in contact with hospice of the [place].  I suspect he will transition to full hospice care over the next few weeks. Recommendations: Continue broad-spectrum intravenous antibiotics, follow-up cultures Continue Decadron changed to oral dosing Diet as recommended by speech therapy OxyContin/oxycodone for pain- consider weaning the oxycodone dose for persistent confusion Physical therapy Skilled nursing facility placement Place Foley catheter Repeat chemistry panel 04/06/2022 Palliative care referral to hospice of the [place] at discharge Change Keppra to oral dosing     LOS: 6 days   Betsy Coder, MD    04/05/2022, 1:34 PM

## 2022-04-05 NOTE — Progress Notes (Signed)
Speech Language Pathology Treatment: Dysphagia  Patient Details Name: Phillip [name] MRN: [id number] DOB: 1945-06-15 Today's Date: 04/05/2022 Time: 1009-1030 SLP Time Calculation (min) (ACUTE ONLY): 21 min  Assessment / Plan / Recommendation Clinical Impression  Pt seen for dysphagia treatment per MD order as pt with improved mentation and he desires dietary advancement.   Pt today with continued delay in initiation presumed due to frontal brain mets. He is able to feed himself and was observed consuming thin liquids, puree and graham cracker boluses.  Prolonged "mastication" with solids noted that resulting in delayed oral transiting, right side lateral sulci retention, and minimally increased work of breathing.  Consumption of puree assisted to transit cracker into pharynx - which will be safer strategy than liquid swallows due to initiation deficits risking premature spillage of liquids into pharynx/larynx.  No overt indication of aspiration noted; subtle throat clearing observed with intake.  Recommend to advance diet to dys3/thin assuring applesauce or puree sent given with every meal to aid oral transiting of solids.  Anticipate pt's intake to continue to be compromised due to initiation deficits.  Pt will need full supervision initially to assure he is following compensation strategies and/or has a safe/efficient swallow. Today he required max cues to recall use of applesauce to transit solids.    No family present at this time, will follow up for family education.  Advised RN to recommendation and posted swallow precaution signs in the pt's room.     HPI HPI: Pt is a 77 year old male admitted for sepsis secondary to biliary infection and with history of S4 RCC w/ liver, pulm and brain mets.  PMHx including but not limited to anemia, CKD, renal CA, HTN, seizure, cholecystitis in 11/23 with cholecystostomy tube placed - and lap cholecystectomy 03/15/22.  A brain MRI this week confirmed increase in  the size of a dominant left frontal lobe metastasis with mild worsening of edema.  Pt has been on a puree/thin diet since his initial swallow evaluation. Reconsult ordered due to pt demonstrating improved mentation and desire for dietary advancement.      SLP Plan  Continue with current plan of care      Recommendations for follow up therapy are one component of a multi-disciplinary discharge planning process, led by the attending physician.  Recommendations may be updated based on patient status, additional functional criteria and insurance authorization.    Recommendations  Liquids provided via: Cup;Straw Medication Administration: Whole meds with puree Supervision: Staff to assist with self feeding Compensations: Slow rate;Small sips/bites;Other (Comment) (follow masticated solids with applesauce or other puree to clear oral retention) Postural Changes and/or Swallow Maneuvers: Upright 30-60 min after meal;Seated upright 90 degrees                Oral Care Recommendations: Oral care BID Follow Up Recommendations: No SLP follow up SLP Visit Diagnosis: Dysphagia, oral phase (R13.11) Plan: Continue with current plan of care        Phillip Lime, MS [place]    Phillip [name]  04/05/2022, 3:02 PM

## 2022-04-05 NOTE — TOC Progression Note (Incomplete Revision)
Transition of Care [facility]) - Progression Note    Patient Details  Name: Phillip [name] MRN: [id number] Date of Birth: [birth year]  Transition of Care [facility]) CM/SW [place], LCSW Phone Number: 04/05/2022, 10:09 AM  Clinical Narrative:     CSW received a call from pt's spouse, [name], she reported her concerns with pt's bed offers. She stated, "he cannot go to a 1 or 2-star facility." CSW explained to pt's spouse she will need to choose from one of the 9 facilities that offer her a bed, or she may have to take pt home. Pt's spouse reported she wants to try a facility in [place]. CSW asked for the facility name. pt's spouse stated she would call CSW back. TOC to follow.  [place] 12:30pm  CSW received a call from pt's wife requesting pt's information be sent to [facility], also requesting all facilities that have declined the pt. CSW informed pt's spouse  [facility] is considering pt, however, he cannot have cancer treatment while in the facility. Per pt's wife pt's cancer treatment will stop while he is in rehab. CSW reached out to [facility] admission no answer left a HIPAA complaint VM, requesting a return call. Pt's wife reported she is heading to the hospital with a list of 4 and 5-star facilities and would like pt's information sent out to the facilities on the list. Pt's wife reported she is willing to pay out of pocket for a higher-rated facility if insurance does not cover it. TOC to follow.  2:16pm CSW spoke with pt's spouse and received a list of facilities she would like pt's information sent out to.  [facility]- declined, no beds  [facility] -out of network [facility]-declined,can't meet needs [facility]- declined can't meet pt's needs [facility]-pending  [facility]-declined,no beds full [facility]-pending left VM [facility]-pending left VM [facility]-pending left VM [facility]-no answer  [facility]-pending  left VM [facility] -is an retirement community    Expected Discharge Plan: [place] Barriers to Discharge: Continued Medical Work up  Expected Discharge Plan and [place] arrangements for the past 2 months: Single Family Home                                       Social Determinants of Health (SDOH) Interventions [place]: No Food Insecurity (03/30/2022)  Housing: Low Risk  (03/30/2022)  Transportation Needs: No Transportation Needs (03/30/2022)  Utilities: Not At Risk (03/30/2022)  Tobacco Use: Low Risk  (03/30/2022)    Readmission Risk Interventions    01/09/2022    1:14 PM  Readmission Risk Prevention Plan  Transportation Screening Complete  PCP or Specialist Appt within 3-5 Days Complete  Social Work Consult for [place] Planning/Counseling Complete  Palliative Care Screening Complete  Medication Review [employer]) Complete

## 2022-04-05 NOTE — Plan of Care (Signed)
  Problem: Coping: Goal: Level of anxiety will decrease Outcome: Progressing   Problem: Pain Managment: Goal: General experience of comfort will improve Outcome: Progressing   Problem: Safety: Goal: Ability to remain free from injury will improve Outcome: Progressing   Problem: Skin Integrity: Goal: Risk for impaired skin integrity will decrease Outcome: Progressing   

## 2022-04-05 NOTE — Progress Notes (Addendum)
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  He was initially worked up for possible sepsis from biliary infection but HIDA scan was negative.  General surgery and IR consulted for possible abscess formation but think it is more consistent with postsurgical changes at this time.  He has been treated empirically with cefepime and Flagyl with improvement in his white blood cell count.    Assessment & Plan:   Principal Problem:   Sepsis ([place]) Active Problems:   Renal cell carcinoma ([name])   HTN (hypertension)   Seizure disorder ([name])   Hypercalcemia   Normocytic anemia   1-Sepsis; Leukocytosis Presume for intra-abdominal infection -Patient presented with fever, leukocytosis, concern for biliary infection initially.  CT show a small gas collection in the gallbladder fossa after cholecystectomy with possible abscess formation.  HIDA scan was negative.  Surgery and IR do not think there was enough fluid for aspiration.  Surgery did not think there was concern for infection in the gallbladder fossa.  Blood cultures urine culture negative ,  viral panel negative -Continue with IV cefepime and Flagyl -plan to check chest x ray to ruled out PNA>    2-Acute metabolic encephalopathy Patient was noted to be more confused than baseline. Status somewhat improved after he was a started on IV antibiotics Worsening mental status multifactorial in the setting of brain metastasis, sepsis. 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 On IV Decadron by oncology Continue with Keppra Dr [name] recommending hospice care.   4-Urinary retention Gross hematuria AKI  Gross hematuria  after catheter placement. He is leaking through the catheter, fail voiding trial. Foley catheter placed.  Worsening renal function today due to urine retention. Foley catheter placement. Continue with IV fluids.   5-Hypophosphatemia Replaced.   Normocytic anemia Monitor hb  Hypokalemia; replete orally.   Estimated body mass index is 31.38 kg/m as calculated from the following:   Height as of this encounter: '5\' 11"'$  (1.803 m).   Weight as of this encounter: 102.1 kg.   DVT prophylaxis: SCD Code Status: DNR Family Communication:Wife over phone Disposition Plan:  Status is: Inpatient Remains inpatient appropriate because: management of confusion     Consultants:  General Sx Oncology   Procedures:    Antimicrobials:    Subjective: No new complaints, alert  Objective: Vitals:   04/04/22 0347 04/04/22 1309 04/04/22 2031 04/05/22 0623  BP: (!) 142/78 121/68 (!) 140/66 (!) 142/65  Pulse: 81 90 92 75  Resp: '20 16 20 20  '$ Temp: 98.4 F (36.9 C) 98.4 F (36.9 C) 98.9 F (37.2 C) 98.4 F (36.9 C)  TempSrc: Oral Oral Oral Oral  SpO2: 99% 98% 99% 99%  Weight:      Height:        Intake/Output Summary (Last 24 hours) at 04/05/2022 0929 Last data filed at 04/05/2022 0800 Gross per 24 hour  Intake 2810.97 ml  Output 1857 ml  Net 953.97 ml    Filed Weights   03/30/22 1029  Weight: 102.1 kg    Examination:  General exam:  NAD Respiratory system: CTA Cardiovascular system: S 1, S2  RRR Gastrointestinal system: BS present, soft, nt Central nervous system: alert Extremities: no edema  Data Reviewed: I have personally reviewed following labs and imaging studies  CBC: Recent Labs  Lab 03/29/22 1031 03/30/22 1138 03/31/22 0417 04/01/22 0322 04/02/22 0257 04/02/22 1146  04/03/22 0040 04/03/22 1239 04/04/22 0415 04/05/22 0323  WBC 16.4* 13.8*   < > 24.8* 15.7* 13.3* 17.5* 12.7* 16.8* 14.6*  NEUTROABS 12.7* 10.3*  --  20.8* 12.6*  --   --   --   --   --   HGB 9.6* 9.6*   < > 9.0* 7.8* 7.7* 8.6* 8.1* 8.5* 7.3*  HCT 31.8* 33.7*   < > 30.4* 26.8* 26.5* 29.3* 28.0* 29.1* 25.4*  MCV 83.9 88.0   < > 85.9 87.9 87.2 86.7 87.8 87.1 87.6  PLT 623* 530*   < > 503* 390 335 444* 376 430* 253   < > = values in this interval not displayed.    Basic Metabolic Panel: Recent Labs  Lab 04/02/22 0257 04/02/22 1146 04/03/22 0040 04/04/22 0415 04/05/22 0323  NA 139 141 139 141 138  K 3.7 4.0 3.7 3.4* 3.7  CL 111 113* 109 109 106  CO2 20* 20* 23 23 18*  GLUCOSE 150* 187* 180* 149* 167*  BUN 24* '20 18 19 '$ 42*  CREATININE 0.60* 0.60* 0.52* 0.60* 1.76*  CALCIUM 8.5* 8.4* 8.6* 8.2* 7.4*  MG 2.2  --  2.2 2.1 1.9  PHOS 1.8*  --  1.9* 2.3*  --     GFR: Estimated Creatinine Clearance: 42.8 mL/min (A) (by C-G formula based on SCr of 1.76 mg/dL (H)). Liver Function Tests: Recent Labs  Lab 03/31/22 0417 04/01/22 0322 04/02/22 0257 04/03/22 0040 04/04/22 0415  AST 36 '16 18 18 16  '$ ALT 77* 48* 35 34 27  ALKPHOS 182* 125 110 121 108  BILITOT 0.6 0.2* 0.5 0.5 0.3  PROT 6.7 5.9* 5.6* 6.4* 6.2*  ALBUMIN 2.5* 2.3* 2.0* 2.5* 2.4*    Recent Labs  Lab 03/30/22 1138  LIPASE 29    No results for input(s): "AMMONIA" in the last 168 hours. Coagulation Profile: Recent Labs  Lab 03/30/22 1138 03/31/22 0417  INR 1.3* 1.3*    Cardiac Enzymes: No results for input(s): "CKTOTAL", "CKMB", "CKMBINDEX", "TROPONINI" in the last 168 hours. BNP (last 3 results) No results for input(s): "PROBNP" in the last 8760 hours. HbA1C: No results for input(s): "HGBA1C" in the last 72 hours. CBG: No results for input(s): "GLUCAP" in the last 168 hours. Lipid Profile: No results for input(s): "CHOL", "HDL", "LDLCALC", "TRIG", "CHOLHDL", "LDLDIRECT" in the last 72 hours. Thyroid  Function Tests: No results for input(s): "TSH", "T4TOTAL", "FREET4", "T3FREE", "THYROIDAB" in the last 72 hours. Anemia Panel: No results for input(s): "VITAMINB12", "FOLATE", "FERRITIN", "TIBC", "IRON", "RETICCTPCT" in the last 72 hours. Sepsis Labs: Recent Labs  Lab 03/30/22 1138 03/31/22 0417  PROCALCITON  --  0.38  LATICACIDVEN 1.7  --      Recent Results (from the past 240 hour(s))  Blood Culture (routine x 2)     Status: None   Collection Time: 03/30/22 10:48 AM   Specimen: BLOOD LEFT FOREARM  Result Value Ref Range Status   Specimen Description BLOOD LEFT FOREARM  Final   Special Requests   Final    BOTTLES DRAWN AEROBIC AND ANAEROBIC Blood Culture adequate volume   Culture   Final    NO GROWTH 5 DAYS Performed at  [facility], [place] [address] [place], [place] [address]    Report Status 04/04/2022 FINAL  Final  Blood Culture (routine x 2)     Status: None   Collection Time: 03/30/22 11:10 AM   Specimen: BLOOD  Result Value Ref Range Status   Specimen Description BLOOD SITE NOT SPECIFIED  Final   Special Requests   Final    BOTTLES DRAWN AEROBIC AND ANAEROBIC Blood Culture adequate volume   Culture   Final    NO GROWTH 5 DAYS Performed at [facility], [place] [address] [place], [place] [address]    Report Status 04/04/2022 FINAL  Final  Resp panel by RT-PCR (RSV, Flu A&B, Covid) Anterior Nasal Swab     Status: None   Collection Time: 03/30/22 11:43 AM   Specimen: Anterior Nasal Swab  Result Value Ref Range Status   SARS Coronavirus 2 by RT PCR NEGATIVE NEGATIVE Final    Comment: (NOTE) SARS-CoV-2 target nucleic acids are NOT DETECTED.  The SARS-CoV-2 RNA is generally detectable in upper respiratory specimens during the acute phase of infection. The lowest concentration of SARS-CoV-2 viral copies this assay can detect is 138 copies/mL. A negative result does not preclude SARS-Cov-2 infection and should not be used as the sole basis for treatment  or other patient management decisions. A negative result may occur with  improper specimen collection/handling, submission of specimen other than nasopharyngeal swab, presence of viral mutation(s) within the areas targeted by this assay, and inadequate number of viral copies(<138 copies/mL). A negative result must be combined with clinical observations, patient history, and epidemiological information. The expected result is Negative.  Fact Sheet for Patients:  EntrepreneurPulse.com.au  Fact Sheet for Healthcare Providers:  IncredibleEmployment.be  This test is no t yet approved or cleared by the [place] FDA and  has been authorized for detection and/or diagnosis of SARS-CoV-2 by FDA under an Emergency Use Authorization (EUA). This EUA will remain  in effect (meaning this test can be used) for the duration of the COVID-19 declaration under Section 564(b)(1) of the Act, 21 U.S.C.section 360bbb-3(b)(1), unless the authorization is terminated  or revoked sooner.       Influenza A by PCR NEGATIVE NEGATIVE Final   Influenza B by PCR NEGATIVE NEGATIVE Final    Comment: (NOTE) The Xpert Xpress SARS-CoV-2/FLU/RSV plus assay is intended as an aid in the diagnosis of influenza from Nasopharyngeal swab specimens and should not be used as a sole basis for treatment. Nasal washings and aspirates are unacceptable for Xpert Xpress SARS-CoV-2/FLU/RSV testing.  Fact Sheet for Patients: EntrepreneurPulse.com.au  Fact Sheet for Healthcare Providers: IncredibleEmployment.be  This test is not yet approved or cleared by the [place] FDA and has been authorized for detection and/or diagnosis of SARS-CoV-2 by FDA under an Emergency Use Authorization (EUA). This EUA will remain in effect (meaning this test can be used) for the duration of the COVID-19 declaration under Section 564(b)(1) of the Act, 21 U.S.C. section  360bbb-3(b)(1), unless the authorization is terminated or revoked.     Resp Syncytial Virus by PCR NEGATIVE NEGATIVE Final    Comment: (NOTE) Fact Sheet for Patients: EntrepreneurPulse.com.au  Fact Sheet for Healthcare Providers: IncredibleEmployment.be  This test is not yet approved or cleared by the [place] FDA and has been authorized for detection and/or diagnosis of SARS-CoV-2 by FDA under an Emergency Use Authorization (EUA). This EUA will remain in effect (meaning this test can be used) for the duration of the COVID-19  declaration under Section 564(b)(1) of the Act, 21 U.S.C. section 360bbb-3(b)(1), unless the authorization is terminated or revoked.  Performed at [facility], [place] [address], [place],  [address]          Radiology Studies: No results found.      Scheduled Meds:  Chlorhexidine Gluconate Cloth  6 each Topical Daily   dexamethasone  4 mg Oral Daily   mouth rinse  15 mL Mouth Rinse 4 times per day   oxyCODONE  10-20 mg Oral Daily   oxyCODONE  20 mg Oral QHS   testosterone  5 g Transdermal Daily   Continuous Infusions:  sodium chloride 100 mL/hr at 04/05/22 0819   ceFEPime (MAXIPIME) IV 2 g (04/05/22 0618)   levETIRAcetam 250 mg (04/05/22 0617)   metronidazole 500 mg (04/04/22 2247)     LOS: 6 days    Time spent: 35 minutes    Phillip Gavidia A Maevis Mumby, MD [facility]   If 7PM-7AM, please contact night-coverage [email or web address]  04/05/2022, 9:29 AM

## 2022-04-06 ENCOUNTER — Inpatient Hospital Stay (HOSPITAL_COMMUNITY): Payer: PPO

## 2022-04-06 DIAGNOSIS — R609 Edema, unspecified: Secondary | ICD-10-CM | POA: Diagnosis not present

## 2022-04-06 DIAGNOSIS — A419 Sepsis, unspecified organism: Secondary | ICD-10-CM | POA: Diagnosis not present

## 2022-04-06 DIAGNOSIS — I82402 Acute embolism and thrombosis of unspecified deep veins of left lower extremity: Secondary | ICD-10-CM | POA: Diagnosis not present

## 2022-04-06 DIAGNOSIS — C649 Malignant neoplasm of unspecified kidney, except renal pelvis: Secondary | ICD-10-CM | POA: Diagnosis not present

## 2022-04-06 LAB — CBC
HCT: 26.4 % — ABNORMAL LOW (ref 39.0–52.0)
Hemoglobin: 7.9 g/dL — ABNORMAL LOW (ref 13.0–17.0)
MCH: 25.9 pg — ABNORMAL LOW (ref 26.0–34.0)
MCHC: 29.9 g/dL — ABNORMAL LOW (ref 30.0–36.0)
MCV: 86.6 fL (ref 80.0–100.0)
Platelets: 229 10*3/uL (ref 150–400)
RBC: 3.05 MIL/uL — ABNORMAL LOW (ref 4.22–5.81)
RDW: 18.4 % — ABNORMAL HIGH (ref 11.5–15.5)
WBC: 11.9 10*3/uL — ABNORMAL HIGH (ref 4.0–10.5)
nRBC: 0 % (ref 0.0–0.2)

## 2022-04-06 LAB — BASIC METABOLIC PANEL
Anion gap: 9 (ref 5–15)
BUN: 17 mg/dL (ref 8–23)
CO2: 22 mmol/L (ref 22–32)
Calcium: 7.4 mg/dL — ABNORMAL LOW (ref 8.9–10.3)
Chloride: 109 mmol/L (ref 98–111)
Creatinine, Ser: 0.69 mg/dL (ref 0.61–1.24)
GFR, Estimated: >60 mL/min (ref >60)
Glucose, Bld: 130 mg/dL — ABNORMAL HIGH (ref 70–99)
Potassium: 3.3 mmol/L — ABNORMAL LOW (ref 3.5–5.1)
Sodium: 140 mmol/L (ref 135–145)

## 2022-04-06 LAB — PHOSPHORUS: Phosphorus: 1.5 mg/dL — ABNORMAL LOW (ref 2.5–4.6)

## 2022-04-06 MED ORDER — MAGNESIUM SULFATE 2 GM/50ML IV SOLN
2.0000 g | Freq: Once | INTRAVENOUS | Status: AC
  Administered 2022-04-06: 2 g via INTRAVENOUS
  Filled 2022-04-06: qty 50

## 2022-04-06 MED ORDER — AMOXICILLIN-POT CLAVULANATE 875-125 MG PO TABS
1.0000 | ORAL_TABLET | Freq: Two times a day (BID) | ORAL | Status: AC
  Administered 2022-04-06 – 2022-04-08 (×6): 1 via ORAL
  Filled 2022-04-06 (×6): qty 1

## 2022-04-06 MED ORDER — APIXABAN 5 MG PO TABS
5.0000 mg | ORAL_TABLET | Freq: Two times a day (BID) | ORAL | Status: DC
  Administered 2022-04-06 – 2022-04-18 (×25): 5 mg via ORAL
  Filled 2022-04-06 (×25): qty 1

## 2022-04-06 MED ORDER — POTASSIUM CHLORIDE 20 MEQ PO PACK
40.0000 meq | PACK | Freq: Once | ORAL | Status: AC
  Administered 2022-04-06: 40 meq via ORAL
  Filled 2022-04-06: qty 2

## 2022-04-06 MED ORDER — FUROSEMIDE 10 MG/ML IJ SOLN
20.0000 mg | Freq: Once | INTRAMUSCULAR | Status: AC
  Administered 2022-04-06: 20 mg via INTRAVENOUS
  Filled 2022-04-06: qty 2

## 2022-04-06 MED ORDER — POTASSIUM PHOSPHATES 15 MMOLE/5ML IV SOLN
30.0000 mmol | Freq: Once | INTRAVENOUS | Status: AC
  Administered 2022-04-06: 30 mmol via INTRAVENOUS
  Filled 2022-04-06: qty 10

## 2022-04-06 MED ORDER — POTASSIUM CHLORIDE CRYS ER 20 MEQ PO TBCR
40.0000 meq | EXTENDED_RELEASE_TABLET | Freq: Once | ORAL | Status: AC
  Administered 2022-04-06: 40 meq via ORAL
  Filled 2022-04-06: qty 2

## 2022-04-06 NOTE — TOC Progression Note (Addendum)
Transition of Care [facility]) - Progression Note    Patient Details  Name: Phillip [name] MRN: [id number] Date of Birth: 1945/10/25  Transition of [facility]) CM/SW [place], LCSW Phone Number: 04/06/2022, 11:29 AM  Clinical Narrative:    CSW spoke to [place] admission with [facility], she reported they could offer pt a bed. CSW spoke with pt's wife and provided an update. Pt's wife reported she would like to tour [facility] before she makes a decision. CSW also provided an update on the facilities that the wife wanted CSW to reach out to. CSW reminded pt's spouse insurance auth still needs to be started. TOC to follow.  ADDEN 3:00pm CSW attempted to contact pt's wife to get bed choice, no answer , straight to VM. TOC to follow.   4:25pm CSW spoke with pt's wife regarding bed offer, she inquired about [facility]. CSW informed pt's wife the facility had not responded and reminded her pt received 10 other offers. CSW inquired if pt's wife was able to tour [facility], one of the facilities she asked CSW to look into. Pt's wife reported she had not seen the facility, and would go see the facility in the morning. CSW explained the urgency of choosing a bed, insurance auth will need to be started. Pt's wife stated she would contact TOC with a decision after she tours the facility.    Expected Discharge Plan: [place] Barriers to Discharge: Continued Medical Work up  Expected Discharge Plan and Services       Living arrangements for the past 2 months: Single Family Home                                       Social Determinants of Health (SDOH) Interventions [place]: No Food Insecurity (03/30/2022)  Housing: Low Risk  (03/30/2022)  Transportation Needs: No Transportation Needs (03/30/2022)  Utilities: Not At Risk (03/30/2022)  Tobacco Use: Low Risk  (03/30/2022)    Readmission Risk Interventions    01/09/2022    1:14 PM   Readmission Risk Prevention Plan  Transportation Screening Complete  PCP or Specialist Appt within 3-5 Days Complete  Social Work Consult for [place] Planning/Counseling Complete  Palliative Care Screening Complete  Medication Review Press photographer) Complete

## 2022-04-06 NOTE — Progress Notes (Signed)
LLE venous duplex and IVC/Left iliac duplex have been completed.  Preliminary findings given to Dr. Tyrell Antonio.    Results can be found under chart review under CV PROC. 04/06/2022 5:08 PM Idalis Hoelting RVT, RDMS

## 2022-04-06 NOTE — Progress Notes (Addendum)
Noted patient's scrotal edema, scrotum elevated and Dr Tyrell Antonio notified, assessed patient and IV lasix ordered and given, will continue to assess patient.

## 2022-04-06 NOTE — Progress Notes (Signed)
IP PROGRESS NOTE  Subjective:   [name] was alert when I saw him at approximately 7 AM.  He denies pain.   Objective: Vital signs in last 24 hours: Blood pressure 135/64, pulse 97, temperature 98.9 F (37.2 C), temperature source Oral, resp. rate 20, height 5' 11"$  (1.803 m), weight 225 lb (102.1 kg), SpO2 99 %.  Intake/Output from previous day: 02/08 0701 - 02/09 0700 In: 1400 [I.V.:1200; IV Piggyback:200] Out: 5000 [Urine:5000]  Physical Exam:  HEENT: No thrush Lungs: Clear anteriorly, no respiratory distress Cardiac: Regular rate and rhythm Abdomen: Soft and nontender GU: Foley in place, scrotal edema Extremities: Anasarca, extensive edema throughout the left leg Neurologic: Alert, moves extremities to commands, speaks a few words skin: Minimal erythema of the anterior chest  Portacath/PICC-without erythema  Lab Results: Recent Labs    04/05/22 0323 04/06/22 0438  WBC 14.6* 11.9*  HGB 7.3* 7.9*  HCT 25.4* 26.4*  PLT 253 229    BMET Recent Labs    04/05/22 0323 04/06/22 0438  NA 138 140  K 3.7 3.3*  CL 106 109  CO2 18* 22  GLUCOSE 167* 130*  BUN 42* 17  CREATININE 1.76* 0.69  CALCIUM 7.4* 7.4*    Lab Results  Component Value Date   CEA1 1.77 01/04/2016    Studies/Results: CT ABDOMEN PELVIS WO CONTRAST  Result Date: 04/05/2022 CLINICAL DATA:  Sepsis workup. History of renal cell carcinoma with lumbar metastases. EXAM: CT ABDOMEN AND PELVIS WITHOUT CONTRAST TECHNIQUE: Multidetector CT imaging of the abdomen and pelvis was performed following the standard protocol without IV contrast. RADIATION DOSE REDUCTION: This exam was performed according to the departmental dose-optimization program which includes automated exposure control, adjustment of the mA and/or kV according to patient size and/or use of iterative reconstruction technique. COMPARISON:  03/30/2022 and 01/08/2022, 02/08/2020 FINDINGS: Lower chest: Central venous catheter tip over the right  cavoatrial junction. Mild opacification over the right lower lobe likely scarring/atelectasis worse compared to the recent prior exam. Heart is normal size. Hepatobiliary: Previous cholecystectomy. Stable 1.5 cm hypodensity of the central left lobe of the liver as this is unchanged from 02/08/2020 and likely a cyst. Biliary tree is unremarkable. Pancreas: Normal. Spleen: Normal. Adrenals/Urinary Tract: Right adrenal gland is normal. 1.3 cm left adrenal nodule unchanged from 03/30/2022 and indeterminate as this was not well visualized on the previous study from 01/08/2022. Kidneys are normal in size. No nephrolithiasis or focal mass. No significant hydronephrosis. Ureters are normal. Foley catheter is present within a decompressed bladder. Stomach/Bowel: Stomach and small bowel are normal. Appendix is not visualized. Colon is unremarkable. Vascular/Lymphatic: Mild calcified plaque of the abdominal aorta which is normal in caliber. A few small nodes along the left iliac chain unchanged. Reproductive: Normal. Other: Nausea findings edema over the lower abdomen/pelvis. Very minimal patchy free fluid over the lower abdomen/pelvis. No significant focal inflammatory change. Musculoskeletal: Multiple destructive lytic bone lesions of the pelvis and spine without significant change and compatible with known metastatic disease. IMPRESSION: 1. No acute findings in the abdomen/pelvis. 2. Mild opacification over the right lower lobe likely scarring/atelectasis worse compared to the recent prior exam. 3. 1.3 cm left adrenal nodule unchanged from 03/30/2022 and and may represent metastatic disease as this was not well visualized on the previous study from 01/08/2022. Recommend attention on follow-up. 4. Multiple destructive lytic bone lesions of the pelvis and spine without significant change and compatible with known osseous metastatic disease. 5. 1.5 cm hypodensity over the central left lobe of  the liver unchanged from  02/08/2020 and likely a cyst. 6. Foley catheter within a decompressed bladder. 7. Aortic atherosclerosis. Aortic Atherosclerosis (ICD10-I70.0). Electronically Signed   By: Marin Olp M.D.   On: 04/05/2022 16:16   DG Chest 2 View  Result Date: 04/05/2022 CLINICAL DATA:  Leukocytosis EXAM: CHEST - 2 VIEW COMPARISON:  03/30/2022 FINDINGS: Port in the anterior chest wall with tip in distal SVC. Normal mediastinum and cardiac silhouette. Normal pulmonary vasculature. No evidence of effusion, infiltrate, or pneumothorax. No acute bony abnormality. IMPRESSION: No acute cardiopulmonary process. Electronically Signed   By: Suzy Bouchard M.D.   On: 04/05/2022 13:14    Medications: I have reviewed the patient's current medications.  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, L4 nerve compression Biopsy of the L4 mass 11/18/2015 confirmed metastatic renal cell carcinoma, clear cell type CTs of the chest, abdomen, and pelvis 11/18/2015-right lower lobe nodule, expansile lytic lesion at the right 11th rib/costal vertebral junction, left renal mass, expansile lesion involving the L4 vertebra, lytic lesion at the left acetabulum, and a low-attenuation liver lesion Initiation of SRS to L4 12/02/2015, Completed 12/12/2015 Initiation of Pazopanib 12/30/2015 Pazopanib placed on hold 02/06/2016 secondary to elevated liver enzymes Pazopanib resumed 03/07/2016 at a dose of 400 mg daily  Pazopanib discontinued 03/19/2016 secondary to elevated liver enzymes Restaging CTs 04/02/2016-stable left renal mass, decreased soft tissue component associated with the L4 metastasis, increased soft tissue component associated with the right 11th rib metastasis with increased T11 bony destruction, increased sclerosis at the left acetabulum lesion Cycle 1 nivolumab 04/12/2016 Cycle 2 nivolumab 04/26/2016 Cycle 3 nivolumab 05/11/2016 Cycle 4 nivolumab 05/24/2016 Cycle 5 nivolumab  06/08/2016 MRI lumbar spine 06/21/2016-unchanged tumor at L3, increased size of retroperitoneal lymph nodes compared to a CT from 04/02/2016 Cycle 6 nivolumab  06/22/2016 CTs chest, abdomen, and pelvis 07/04/2016-enlargement of the left renal mass, right adrenal nodule, left hilar and peritoneal lymph nodes, enlargement of left acetabular lesion. Stable lung nodules. Cycle 7 nivolumab 07/06/2016 Cycle 8 nivolumab 07/20/2016 Cycle 9 nivolumab 08/02/2016 Cycle 10 nivolumab 08/20/2016 Restaging CT 09/03/2016 evaluation with stable disease Cycle 11 nivolumab 09/05/2016 Cycle 12 nivolumab 09/19/2016 Cycle 13 nivolumab 10/03/2016 Cycle 14 nivolumab 10/17/2016 Cycle 15 nivolumab 10/31/2016 Cycle 16 nivolumab 11/14/2016 (changed to monthly schedule) Cycle 17 nivolumab 12/19/2016 CTs 01/21/2017-increased left renal mass, increased size of adrenal metastases, increased lytic bone lesions, increased left lung nodule, persistent tumor at L4 with probable epidural component Initiation of Cabozantinib 01/28/2017 Restaging CTs 05/30/2017- decreased size of left hilar mass, left renal mass, retroperitoneal adenopathy, and adrenal metastasis.  Healing bone lesions. Cabozantinib continued CTs 10/21/2017- interval enlargement left hilar lymph node; stable rib lesions; stable mass left renal cortex; stable mildly nodular adrenal glands; stable lytic lesions within the pelvis and spine. Cabozantinib continued CTs 02/21/2018- enlargement of an AP window lymph node.  Mildly enlarged left hilar lymph node is unchanged.  Primary renal cell carcinoma involving the upper pole of the left kidney appears similar.  Stable enlarged right periaortic lymph node adjacent to the renal vessels.  Stable multifocal bony metastatic disease. Cabozantinib continued CTs 07/29/2018- stable 15 mm AP window nodes; stable left hilar node; subcarinal node slightly larger; right periaortic node 16 mm, previously 12 mm; portacaval node 24 mm,  previously 16 mm; 15 mm node superior to the pancreatic head has enlarged; interval increase nodularity of both adrenal glands; stable left kidney mass; multifocal bony metastatic disease not significantly changed. Cabozantinib continued CTs 11/06/2018- moderate improvement in  thoracic adenopathy; minimal improvement abdominal adenopathy; left kidney upper pole mass and various lytic expansile bone lesions stable; mild increase in nodularity of left adrenal gland; right adrenal gland nodularity stable. 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  Peripheral enhancing masses consistent with metastases. No hemorrhage. MRI brain 03/11/2019-7 enhancing brain masses consistent with metastatic disease SRS to 7 brain lesions, treatment given 03/19/2019, 03/23/2019, and 03/25/2019 CTs 04/09/2019-decrease in left renal mass, bilateral adrenal nodules, AP window and porta hepatic adenopathy.  New 9 mm right lower lobe nodule.  Improved lytic lesion of the left acetabulum.  Other bone lesions are stable. Cabozantinib continued MRI brain 07/17/2019-resolution of 4 mm treated lesion in the right frontal cortex, 6 remaining treated lesions have decreased in size, new punctate metastasis in the superior right cerebellum SRS to right cerebellar lesion 07/30/2019 CTs 08/17/2019-previously noted right lower lobe nodule resolved, stable left kidney mass, mixed lytic/sclerotic bone lesions in the thoracolumbar spine, right posterior ribs, and left acetabulum-unchanged, no evidence of progressive disease  Cabozantinib continued MRI brain 10/23/2019-stable to slight decrease in size of multiple enhancing intracranial lesions.  Slight increase in surrounding edema in the medial left frontal lobe.  No new lesions present. MRI brain 01/29/2020-multiple enhancing brain lesions, some hemorrhagic, some with mild  enlargement-treatment effect? CTs 02/08/2020-no thoracic metastases, enlargement of the left renal mass, enlargement of lytic lesion at T9, other lytic lesions unchanged MRI brain 05/04/2020-no new lesions, slight enlargement of a right occipital lesion, other lesions are stable or decreased in size CTs 08/11/2020-enlargement of left kidney mass, new 1.6 cm segment 7 liver lesion, enlargement of a lytic lesion at T9, increased lysis of a sclerotic lesion at the right second rib, 0.7 cm endoluminal nodule at the bladder dome-enlarged Brain MRI 08/10/2020-slight increase in a 15 mm left frontal lobe lesion, new punctate focus in the right occipital cortex, other lesions stable or slightly decreased CTs 11/01/2020-increase in left suprahilar opacity, enlargement of previous liver metastases, several new subcentimeter lesions, increase in left renal mass, similar appearance of bone metastases, stable hyperdense/enhancing nodule in the left bladder Brain MRI 11/04/2020-no change in brain metastases Lenvatinib/pembrolizumab 11/30/2020 CTs 03/03/2021-stable left hilar lymph nodes; resolution of left upper lobe perihilar nodularity; unchanged for millimeter peripheral right upper lobe nodule; multiple lytic bone lesions appear stable; left kidney lesion decreased in size; bilateral adrenal nodules decreased in size; several liver lesion showed mild increase in size; several new small lesions within the right hepatic lobe. Lenvatinib/pembrolizumab continued 03/07/2021 Brain MRI 03/10/2021-progression of a left frontal metastasis, tiny new metastasis in the right frontal lobe-referred for SBRT to the right frontal lesion and to [facility] to consider a LITT procedure for the progressive left frontal lesion SRS 04/06/2021, LITT procedure at [facility] 04/18/2021-pathology metastatic renal cell carcinoma in background of necrosis/gliosis Lenvatinib on hold Pembrolizumab held 04/24/2021 Pembrolizumab 05/01/2021, lenvatinib remains on  hold pending surgical evaluation Pembrolizumab 05/25/2021, lenvatinib resumed Pembrolizumab 06/15/2021 Lenvima resumed 06/21/2021 CTs 07/28/2021-continued regression left renal lesion.  Much improved appearance of the liver.  Continued regression of bilateral adrenal gland nodules.  Stable mixed lytic and sclerotic metastatic bone disease.  No new or progressive findings. Pembrolizumab 07/31/2021, lenvatinib continued Pembrolizumab held 08/21/2021 due to generalized weakness Pembrolizumab resumed 09/11/2021 MRI brain 10/19/2021-"new "focus of minimal enhancement of the right frontal lobe, larger punctate right cerebellar lesion, review of MRI found the right frontal lesion was present in 2021 and was treated with SRS CTs  11/12/2021-2 new right upper lung nodules, stable left renal lesion, enlarging lytic lesion in the left fourth rib Pembrolizumab and lenvatinib continued Brain 01/02/2022-new 4 mm lesion in the right parietal cortex the largest lesion in left frontal lobe stable in size with more solid  CTs chest, abdomen, and pelvis 01/08/2022-increased soft tissue component of a lytic left acetabular  metastasis and increased sclerotic component of the left fourth rib metastasis, decreased conspicuity of right upper lobe nodules, no change in renal mass Pembrolizumab and lenvatinib continued MRI brain 03/27/2022-minimal increase size of a 3 mm right cerebellar lesion, increased size of enhancing mass at the left frontal lobe measuring 3 x 2.2 cm with adjacent gyriform enhancement and mild worsening of edema in the left frontal white matter CTs 03/30/2022-new pulmonary nodules, small gas collection in the gallbladder fossa, increased left acetabular metastasis   Pain secondary to #1-managed by Dr. Lovenia Shuck.  Improved Hypertension Elevated transaminases 02/06/2016- Pazopanib placed on hold Liver enzymes normal 03/07/2016 Port-A-Cath placement 05/16/2016 Malaise/anorexia 09/05/2016. Cortisol and testosterone  levels low. Hydrocortisone and testosterone replacement initiated. Conjunctival/scleral erythema 09/19/2016-resolved with steroid eyedrops Proximal right leg weakness. 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 Hypercalcemia status post Zometa 01/23/2017-resolved Hypercalcemia 03/29/2022-Zometa Pruritic rash following IV contrast 10/21/2017; rash following IV contrast 02/21/2018 despite prednisone/Benadryl premedication Brief episodes of expressive aphasia October and December 2020 Vitamin B12 deficiency confirmed on lab 03/07/2021-started oral vitamin B12 replacement 03/28/2021 Admission 01/08/2022 with acute cholecystitis/sepsis, cholecystostomy tube Cholecystectomy 03/15/2022 14.  Admission 03/30/2022 with failure to thrive and fever 15.  Hematuria with urinary retention-likely secondary to bleeding from the renal mass 16.  Left lower extremity swelling to 11/16/2022-Doppler confirmed   Mr Phillip [name] has metastatic renal cell carcinoma.  A brain MRI this week confirmed increase in the size of a dominant left frontal lobe metastasis with mild worsening of edema.  He had hypercalcemia and was treated with Zometa on 03/29/2022.  Lenvatinib and pembrolizumab were discontinued.   He was admitted with a fever and failure to thrive.  There is no clear source for infection.  The fever has resolved.  Cultures are negative.  A biliary scan was negative for a bile leak.  Hypercalcemia has resolved.  He has persistent leukocytosis, potentially related to infection, metastatic renal cell carcinoma, and Decadron.  His renal function has improved with placement of the Foley catheter.  He is alert, but less verbal today.   He does not appear to be a candidate for further treatment of the renal cell carcinoma.  His wife is pursuing skilled nursing facility placement to continue physical therapy.  She has been in contact with hosp[facility] of the [place] to request palliative care.  I feel he  is a candidate for full hospice care.  The left leg is significantly swollen today.  He will be referred for a Doppler evaluation. Recommendations: Continue antibiotics per the medical service Continue Decadron Diet as recommended by speech therapy OxyContin/oxycodone for pain- consider weaning the oxycodone dose for persistent confusion Physical therapy Skilled nursing facility placement Foley catheter 8.   Doppler-left leg 9.   Please call oncology as needed, I will see him on 04/09/2022  I was contacted by Dr. Tyrell Antonio with a report the Doppler confirmed a left lower extremity DVT.  I agree with apixaban anticoagulation.   LOS: 7 days   Betsy Coder, MD   04/06/2022, 4:47 PM

## 2022-04-06 NOTE — Discharge Instructions (Signed)

## 2022-04-06 NOTE — Progress Notes (Signed)
Physical Therapy Treatment Patient Details Name: Phillip [name] MRN: [id number] DOB: [birth year] Today's Date: 04/06/2022   History of Present Illness Pt is a 77 year old male admitted for Sepsis secondary to biliary infection and with history of S4 RCC w/ liver, pulm and brain mets.  PMHx including but not limited to anemia, CKD, renal CA, HTN, seizure, cholecystitis in 11/23 with cholecystostomy tube placed - and lap cholecystectomy 03/15/22.  A brain MRI this week confirmed increase in the size of a dominant left frontal lobe metastasis with mild worsening of edema.    PT Comments    Pt looks more swollen.  B hands, B LE, scrotum and throughout. Unable to hold a cup.  Pt's appearance is "weary".  Resting HR 101.  Pt on 4 lts @100$ %.  Trial RA 95% at rest.  Assisted OOB was very difficult.  General bed mobility comments: increased, increased time.  Phillip [name].  Unable to grasp rail. Max/Total  Asisst to complete scooting using bed pad to scoot to EOB.  Once upright, pt was able to static sit at Supervision level and support self. General transfer comment: pt required increased assist this session due to increased EDEMA throughout and weakness.  Pt unable to attempt pushing off and unable to grasp walker.  Used B platform EVA walker to assist from elevated bed to [facility].  Pt was incont BM.  Had to use STEDY to rise from lower level BSC + 3 Total Assist.  Pt was unable to take any functional steps.  Positioned in recliner to comfort.  Educated nursing staff to [place] to assist pt back to bed.   Pt will need ST Rehab at SNF to address mobility and functional decline prior to safely returning home.   Recommendations for follow up therapy are one component of a multi-disciplinary discharge planning process, led by the attending physician.  Recommendations may be updated based on patient status, additional functional criteria and insurance authorization.  Follow Up Recommendations  Skilled  nursing-short term rehab (<3 hours/day) Can patient physically be transported by private vehicle: No   Assistance Recommended at Discharge Frequent or constant Supervision/Assistance  Patient can return home with the following Two people to help with walking and/or transfers;Two people to help with bathing/dressing/bathroom;Help with stairs or ramp for entrance;Assistance with cooking/housework;Assist for transportation;Assistance with feeding;Direct supervision/assist for medications management   Equipment Recommendations  None recommended by PT    Recommendations for Other Services       Precautions / Restrictions Precautions Precautions: Fall Restrictions Weight Bearing Restrictions: No     Mobility  Bed Mobility Overal bed mobility: Needs Assistance Bed Mobility: Supine to Sit     Supine to sit: Max assist, Total assist     General bed mobility comments: increased, increased time.  Phillip [name].  Unable to grasp rail. Max/Total  Asisst to complete scooting using bed pad to scoot to EOB.  Once upright, pt was able to static sit at Supervision level and support self.    Transfers Overall transfer level: Needs assistance Equipment used: Bilateral platform walker Transfers: Sit to/from Stand Sit to Stand: Max assist, Total assist           General transfer comment: pt required increased assist this session due to increased EDEMA throughout and weakness.  Pt unable to attempt pushing off and unable to grasp walker.  Performed several sit to stands from bed to [facility] then to recliner.    Ambulation/Gait  General Gait Details: transfers only this session due to effort/fatigue/safety.   Stairs             Wheelchair Mobility    Modified Rankin (Stroke Patients Only)       Balance                                            Cognition Arousal/Alertness: Awake/alert Behavior During Therapy: Flat affect Overall Cognitive  Status: Impaired/Different from baseline                                 General Comments: less vocal today.  Gooed eye contact.  Following directions.  Few words.        Exercises      General Comments        Pertinent Vitals/Pain Pain Assessment Pain Assessment: Faces Faces Pain Scale: Hurts little more Pain Location: scrotum Pain Descriptors / Indicators: Discomfort, Grimacing Pain Intervention(s): Monitored during session, Repositioned    Home Living                          Prior Function            PT Goals (current goals can now be found in the care plan section) Progress towards PT goals: Progressing toward goals    Frequency    Min 2X/week      PT Plan Current plan remains appropriate    Co-evaluation              AM-PAC PT "6 Clicks" Mobility   Outcome Measure  Help needed turning from your back to your side while in a flat bed without using bedrails?: A Lot Help needed moving from lying on your back to sitting on the side of a flat bed without using bedrails?: A Lot Help needed moving to and from a bed to a chair (including a wheelchair)?: A Lot Help needed standing up from a chair using your arms (e.g., wheelchair or bedside chair)?: A Lot Help needed to walk in hospital room?: Total Help needed climbing 3-5 steps with a railing? : Total 6 Click Score: 10    End of Session Equipment Utilized During Treatment: Gait belt Activity Tolerance: Other (comment) (EDEMA) Patient left: in chair;with chair alarm set;with call bell/phone within reach Nurse Communication: Mobility status;Need for lift equipment PT Visit Diagnosis: Other abnormalities of gait and mobility (R26.89);Muscle weakness (generalized) (M62.81)     Time: 1105-1150 PT Time Calculation (min) (ACUTE ONLY): 45 min  Charges:  $Therapeutic Activity: 38-52 mins                     {Ashby Moskal  PTA Acute  Rehabilitation Services Office M-F           [phone] Weekend pager [phone]

## 2022-04-06 NOTE — Progress Notes (Signed)
Informed Dr. Tyrell Antonio of patient's left knee redness and edema, MD in the room assessing patient, wife at the bedside.

## 2022-04-06 NOTE — Care Management Important Message (Signed)
Important Message  Patient Details IM Letter given. Name: Phillip [name] MRN: [id number] Date of Birth: 12/27/1945   Medicare Important Message Given:  Yes     [name] 04/06/2022, 11:19 AM

## 2022-04-06 NOTE — Progress Notes (Signed)
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  He was initially worked up for possible sepsis from biliary infection but HIDA scan was negative.  General surgery and IR consulted for possible abscess formation but think it is more consistent with postsurgical changes at this time.  He has been treated empirically with cefepime and Flagyl with improvement in his white blood cell count.    Assessment & Plan:   Principal Problem:   Sepsis ([place]) Active Problems:   Renal cell carcinoma ([name])   HTN (hypertension)   Seizure disorder ([name])   Hypercalcemia   Normocytic anemia   1-SIRS;  Sepsis ruled out no source of infection identified.  Leukocytosis Presume for intra-abdominal infection -Patient presented with fever, leukocytosis, concern for biliary infection initially.  CT show a small gas collection in the gallbladder fossa after cholecystectomy with possible abscess formation.  HIDA scan was negative.  Surgery and IR do not think there was enough fluid for aspiration.  Surgery did not think there was concern for infection in the gallbladder fossa.  Blood cultures urine culture negative ,  viral panel negative -Treated with IV cefepime and Flagyl for 8 days. Transition to Augmentin.  -Chest x ray negative for PNA. Repeated CT abdomen pelvis negative for abscess.   2-Acute metabolic encephalopathy Patient was noted to be more confused than baseline. Status somewhat improved after he was a started on IV antibiotics Worsening mental status multifactorial in the setting of brain metastasis, sepsis. 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 On IV Decadron by oncology Continue with Keppra Dr [name] recommending hospice care.   4-Urinary retention Gross hematuria AKI  Gross hematuria  after catheter placement. He is leaking through the catheter, fail voiding trial. Foley catheter placed.  Worsening renal function today due to urine retention. Foley catheter placement. C Renal function improved after foley catheter placement.   5-Hypophosphatemia Replaced.   LE edema, more on the left.  Left knee is not warm, has mild petechia rash skin.  IV fluids Stopped.  IV lasix,  TED hose ordered.  Will check doppler LE.   Scrotum swelling: stop IV fluid. Scrotal elevation. IV lasix.  Suspect related to volume overload.   Normocytic anemia Monitor hb  Hypokalemia; Replete orally.    Estimated body mass index is 31.38 kg/m as calculated from the following:   Height as of this encounter: 5' 11"$  (1.803 m).   Weight as of this encounter: 102.1 kg.   DVT prophylaxis: SCD Code Status: DNR Family Communication: Wife at bedside.   Disposition Plan:  Status is: Inpatient Remains inpatient appropriate because: management of confusion     Consultants:  General Sx Oncology   Procedures:    Antimicrobials:    Subjective: Notice to have new scrotal swelling, also left LE edema, mild redness of knee.  Alert,  saying less words.   Objective: Vitals:   04/05/22 1208 04/05/22 2004 04/06/22 0507 04/06/22 1317  BP: 136/67 119/77 (!) 146/85 135/64  Pulse: 93 92 67 97  Resp: (!) 22 20 20  Temp: 97.7 F (36.5 C) 98.5 F (36.9 C) 97.7 F (36.5 C) 98.9 F (37.2 C)  TempSrc: Oral Oral Oral Oral  SpO2: 98% 100% 99% 99%  Weight:      Height:        Intake/Output Summary (Last 24 hours) at 04/06/2022 1452 Last data filed at 04/06/2022 1403 Gross per  24 hour  Intake 2350 ml  Output 4875 ml  Net -2525 ml    Filed Weights   03/30/22 1029  Weight: 102.1 kg    Examination:  General exam: NAD Respiratory system: CTA Cardiovascular system: S 1, S 2 RRR Gastrointestinal system: BS present, soft,  Central nervous system: alert Extremities: no edema  Data Reviewed: I have personally reviewed following labs and imaging studies  CBC: Recent Labs  Lab 04/01/22 0322 04/02/22 0257 04/02/22 1146 04/03/22 0040 04/03/22 1239 04/04/22 0415 04/05/22 0323 04/06/22 0438  WBC 24.8* 15.7*   < > 17.5* 12.7* 16.8* 14.6* 11.9*  NEUTROABS 20.8* 12.6*  --   --   --   --   --   --   HGB 9.0* 7.8*   < > 8.6* 8.1* 8.5* 7.3* 7.9*  HCT 30.4* 26.8*   < > 29.3* 28.0* 29.1* 25.4* 26.4*  MCV 85.9 87.9   < > 86.7 87.8 87.1 87.6 86.6  PLT 503* 390   < > 444* 376 430* 253 229   < > = values in this interval not displayed.    Basic Metabolic Panel: Recent Labs  Lab 04/02/22 0257 04/02/22 1146 04/03/22 0040 04/04/22 0415 04/05/22 0323 04/06/22 0438  NA 139 141 139 141 138 140  K 3.7 4.0 3.7 3.4* 3.7 3.3*  CL 111 113* 109 109 106 109  CO2 20* 20* 23 23 18* 22  GLUCOSE 150* 187* 180* 149* 167* 130*  BUN 24* 20 18 19 $ 42* 17  CREATININE 0.60* 0.60* 0.52* 0.60* 1.76* 0.69  CALCIUM 8.5* 8.4* 8.6* 8.2* 7.4* 7.4*  MG 2.2  --  2.2 2.1 1.9  --   PHOS 1.8*  --  1.9* 2.3*  --  1.5*    GFR: Estimated Creatinine Clearance: 94.1 mL/min (by C-G formula based on SCr of 0.69 mg/dL). Liver Function Tests: Recent Labs  Lab 03/31/22 0417 04/01/22 0322 04/02/22 0257 04/03/22 0040 04/04/22 0415  AST 36 16 18 18 16  $ ALT 77* 48* 35 34 27  ALKPHOS 182* 125 110 121 108  BILITOT 0.6 0.2* 0.5 0.5 0.3  PROT 6.7 5.9* 5.6* 6.4* 6.2*  ALBUMIN 2.5* 2.3* 2.0* 2.5* 2.4*    No results for input(s): "LIPASE", "AMYLASE" in the last 168 hours.  No results for input(s): "AMMONIA" in the last 168 hours. Coagulation Profile: Recent Labs  Lab 03/31/22 0417   INR 1.3*    Cardiac Enzymes: No results for input(s): "CKTOTAL", "CKMB", "CKMBINDEX", "TROPONINI" in the last 168 hours. BNP (last 3 results) No results for input(s): "PROBNP" in the last 8760 hours. HbA1C: No results for input(s): "HGBA1C" in the last 72 hours. CBG: No results for input(s): "GLUCAP" in the last 168 hours. Lipid Profile: No results for input(s): "CHOL", "HDL", "LDLCALC", "TRIG", "CHOLHDL", "LDLDIRECT" in the last 72 hours. Thyroid Function Tests: No results for input(s): "TSH", "T4TOTAL", "FREET4", "T3FREE", "THYROIDAB" in the last 72 hours. Anemia Panel: No results for input(s): "VITAMINB12", "FOLATE", "FERRITIN", "TIBC", "IRON", "RETICCTPCT" in the last 72 hours. Sepsis Labs: Recent Labs  Lab 03/31/22 0417  PROCALCITON 0.38     Recent Results (from the past  240 hour(s))  Blood Culture (routine x 2)     Status: None   Collection Time: 03/30/22 10:48 AM   Specimen: BLOOD LEFT FOREARM  Result Value Ref Range Status   Specimen Description BLOOD LEFT FOREARM  Final   Special Requests   Final    BOTTLES DRAWN AEROBIC AND ANAEROBIC Blood Culture adequate volume   Culture   Final    NO GROWTH 5 DAYS Performed at [facility], [place] [address] [place], [place] [address]    Report Status 04/04/2022 FINAL  Final  Blood Culture (routine x 2)     Status: None   Collection Time: 03/30/22 11:10 AM   Specimen: BLOOD  Result Value Ref Range Status   Specimen Description BLOOD SITE NOT SPECIFIED  Final   Special Requests   Final    BOTTLES DRAWN AEROBIC AND ANAEROBIC Blood Culture adequate volume   Culture   Final    NO GROWTH 5 DAYS Performed at [facility], [place] [address], [place], [place] [address]    Report Status 04/04/2022 FINAL  Final  Resp panel by RT-PCR (RSV, Flu A&B, Covid) Anterior Nasal Swab     Status: None   Collection Time: 03/30/22 11:43 AM   Specimen: Anterior Nasal Swab  Result Value Ref Range Status   SARS Coronavirus 2 by RT  PCR NEGATIVE NEGATIVE Final    Comment: (NOTE) SARS-CoV-2 target nucleic acids are NOT DETECTED.  The SARS-CoV-2 RNA is generally detectable in upper respiratory specimens during the acute phase of infection. The lowest concentration of SARS-CoV-2 viral copies this assay can detect is 138 copies/mL. A negative result does not preclude SARS-Cov-2 infection and should not be used as the sole basis for treatment or other patient management decisions. A negative result may occur with  improper specimen collection/handling, submission of specimen other than nasopharyngeal swab, presence of viral mutation(s) within the areas targeted by this assay, and inadequate number of viral copies(<138 copies/mL). A negative result must be combined with clinical observations, patient history, and epidemiological information. The expected result is Negative.  Fact Sheet for Patients:  EntrepreneurPulse.com.au  Fact Sheet for Healthcare Providers:  IncredibleEmployment.be  This test is no t yet approved or cleared by the [place] FDA and  has been authorized for detection and/or diagnosis of SARS-CoV-2 by FDA under an Emergency Use Authorization (EUA). This EUA will remain  in effect (meaning this test can be used) for the duration of the COVID-19 declaration under Section 564(b)(1) of the Act, 21 U.S.C.section 360bbb-3(b)(1), unless the authorization is terminated  or revoked sooner.       Influenza A by PCR NEGATIVE NEGATIVE Final   Influenza B by PCR NEGATIVE NEGATIVE Final    Comment: (NOTE) The Xpert Xpress SARS-CoV-2/FLU/RSV plus assay is intended as an aid in the diagnosis of influenza from Nasopharyngeal swab specimens and should not be used as a sole basis for treatment. Nasal washings and aspirates are unacceptable for Xpert Xpress SARS-CoV-2/FLU/RSV testing.  Fact Sheet for Patients: EntrepreneurPulse.com.au  Fact Sheet for  Healthcare Providers: IncredibleEmployment.be  This test is not yet approved or cleared by the [place] FDA and has been authorized for detection and/or diagnosis of SARS-CoV-2 by FDA under an Emergency Use Authorization (EUA). This EUA will remain in effect (meaning this test can be used) for the duration of the COVID-19 declaration under Section 564(b)(1) of the Act, 21 U.S.C. section 360bbb-3(b)(1), unless the authorization is terminated or revoked.  Resp Syncytial Virus by PCR NEGATIVE NEGATIVE Final    Comment: (NOTE) Fact Sheet for Patients: EntrepreneurPulse.com.au  Fact Sheet for Healthcare Providers: IncredibleEmployment.be  This test is not yet approved or cleared by the [place] FDA and has been authorized for detection and/or diagnosis of SARS-CoV-2 by FDA under an Emergency Use Authorization (EUA). This EUA will remain in effect (meaning this test can be used) for the duration of the COVID-19 declaration under Section 564(b)(1) of the Act, 21 U.S.C. section 360bbb-3(b)(1), unless the authorization is terminated or revoked.  Performed at [facility], [place] [address] [place], [place] [address]          Radiology Studies: CT ABDOMEN PELVIS WO CONTRAST  Result Date: 04/05/2022 CLINICAL DATA:  Sepsis workup. History of renal cell carcinoma with lumbar metastases. EXAM: CT ABDOMEN AND PELVIS WITHOUT CONTRAST TECHNIQUE: Multidetector CT imaging of the abdomen and pelvis was performed following the standard protocol without IV contrast. RADIATION DOSE REDUCTION: This exam was performed according to the departmental dose-optimization program which includes automated exposure control, adjustment of the mA and/or kV according to patient size and/or use of iterative reconstruction technique. COMPARISON:  03/30/2022 and 01/08/2022, 02/08/2020 FINDINGS: Lower chest: Central venous catheter tip  over the right cavoatrial junction. Mild opacification over the right lower lobe likely scarring/atelectasis worse compared to the recent prior exam. Heart is normal size. Hepatobiliary: Previous cholecystectomy. Stable 1.5 cm hypodensity of the central left lobe of the liver as this is unchanged from 02/08/2020 and likely a cyst. Biliary tree is unremarkable. Pancreas: Normal. Spleen: Normal. Adrenals/Urinary Tract: Right adrenal gland is normal. 1.3 cm left adrenal nodule unchanged from 03/30/2022 and indeterminate as this was not well visualized on the previous study from 01/08/2022. Kidneys are normal in size. No nephrolithiasis or focal mass. No significant hydronephrosis. Ureters are normal. Foley catheter is present within a decompressed bladder. Stomach/Bowel: Stomach and small bowel are normal. Appendix is not visualized. Colon is unremarkable. Vascular/Lymphatic: Mild calcified plaque of the abdominal aorta which is normal in caliber. A few small nodes along the left iliac chain unchanged. Reproductive: Normal. Other: Nausea findings edema over the lower abdomen/pelvis. Very minimal patchy free fluid over the lower abdomen/pelvis. No significant focal inflammatory change. Musculoskeletal: Multiple destructive lytic bone lesions of the pelvis and spine without significant change and compatible with known metastatic disease. IMPRESSION: 1. No acute findings in the abdomen/pelvis. 2. Mild opacification over the right lower lobe likely scarring/atelectasis worse compared to the recent prior exam. 3. 1.3 cm left adrenal nodule unchanged from 03/30/2022 and and may represent metastatic disease as this was not well visualized on the previous study from 01/08/2022. Recommend attention on follow-up. 4. Multiple destructive lytic bone lesions of the pelvis and spine without significant change and compatible with known osseous metastatic disease. 5. 1.5 cm hypodensity over the central left lobe of the liver unchanged  from 02/08/2020 and likely a cyst. 6. Foley catheter within a decompressed bladder. 7. Aortic atherosclerosis. Aortic Atherosclerosis (ICD10-I70.0). Electronically Signed   By: Marin Olp M.D.   On: 04/05/2022 16:16   DG Chest 2 View  Result Date: 04/05/2022 CLINICAL DATA:  Leukocytosis EXAM: CHEST - 2 VIEW COMPARISON:  03/30/2022 FINDINGS: Port in the anterior chest wall with tip in distal SVC. Normal mediastinum and cardiac silhouette. Normal pulmonary vasculature. No evidence of effusion, infiltrate, or pneumothorax. No acute bony abnormality. IMPRESSION: No acute cardiopulmonary process. Electronically Signed   By: Suzy Bouchard M.D.   On: 04/05/2022  13:14        Scheduled Meds:  amoxicillin-clavulanate  1 tablet Oral Q12H   Chlorhexidine Gluconate Cloth  6 each Topical Daily   dexamethasone  4 mg Oral Daily   levETIRAcetam  250 mg Oral BID   mouth rinse  15 mL Mouth Rinse 4 times per day   oxyCODONE  10-20 mg Oral Daily   oxyCODONE  20 mg Oral QHS   testosterone  5 g Transdermal Daily   Continuous Infusions:     LOS: 7 days    Time spent: 35 minutes    Masiyah Jorstad A Jatoria Kneeland, MD [facility]   If 7PM-7AM, please contact night-coverage [email or web address]  04/06/2022, 2:52 PM

## 2022-04-07 DIAGNOSIS — C649 Malignant neoplasm of unspecified kidney, except renal pelvis: Secondary | ICD-10-CM | POA: Diagnosis not present

## 2022-04-07 LAB — BASIC METABOLIC PANEL
Anion gap: 6 (ref 5–15)
BUN: 16 mg/dL (ref 8–23)
CO2: 23 mmol/L (ref 22–32)
Calcium: 7.5 mg/dL — ABNORMAL LOW (ref 8.9–10.3)
Chloride: 107 mmol/L (ref 98–111)
Creatinine, Ser: 0.46 mg/dL — ABNORMAL LOW (ref 0.61–1.24)
GFR, Estimated: >60 mL/min (ref >60)
Glucose, Bld: 232 mg/dL — ABNORMAL HIGH (ref 70–99)
Potassium: 4.5 mmol/L (ref 3.5–5.1)
Sodium: 136 mmol/L (ref 135–145)

## 2022-04-07 LAB — CBC
HCT: 25.6 % — ABNORMAL LOW (ref 39.0–52.0)
Hemoglobin: 7.6 g/dL — ABNORMAL LOW (ref 13.0–17.0)
MCH: 25.4 pg — ABNORMAL LOW (ref 26.0–34.0)
MCHC: 29.7 g/dL — ABNORMAL LOW (ref 30.0–36.0)
MCV: 85.6 fL (ref 80.0–100.0)
Platelets: 225 10*3/uL (ref 150–400)
RBC: 2.99 MIL/uL — ABNORMAL LOW (ref 4.22–5.81)
RDW: 18.2 % — ABNORMAL HIGH (ref 11.5–15.5)
WBC: 16 10*3/uL — ABNORMAL HIGH (ref 4.0–10.5)
nRBC: 0 % (ref 0.0–0.2)

## 2022-04-07 LAB — PHOSPHORUS: Phosphorus: 2.2 mg/dL — ABNORMAL LOW (ref 2.5–4.6)

## 2022-04-07 LAB — MAGNESIUM: Magnesium: 2.2 mg/dL (ref 1.7–2.4)

## 2022-04-07 MED ORDER — FUROSEMIDE 10 MG/ML IJ SOLN
20.0000 mg | Freq: Once | INTRAMUSCULAR | Status: AC
  Administered 2022-04-07: 20 mg via INTRAVENOUS
  Filled 2022-04-07: qty 2

## 2022-04-07 MED ORDER — POTASSIUM CHLORIDE CRYS ER 20 MEQ PO TBCR
20.0000 meq | EXTENDED_RELEASE_TABLET | Freq: Once | ORAL | Status: AC
  Administered 2022-04-07: 20 meq via ORAL
  Filled 2022-04-07: qty 1

## 2022-04-07 MED ORDER — SODIUM PHOSPHATES 45 MMOLE/15ML IV SOLN
15.0000 mmol | Freq: Once | INTRAVENOUS | Status: AC
  Administered 2022-04-07: 15 mmol via INTRAVENOUS
  Filled 2022-04-07: qty 5

## 2022-04-07 NOTE — TOC Progression Note (Signed)
Transition of Care [facility]) - Progression Note    Patient Details  Name: Phillip [name] MRN: [id number] Date of Birth: 07-Nov-1945  Transition of Care [facility]) CM/SW Contact  Rodney Booze, [place] Phone Number: 04/07/2022, 11:18 AM  Clinical Narrative:    This CSW has reached out the patient's wife to follow up with bed offer possibilities. At this time the patient's wife is refusing to pick a bed due to her waiting on [facility]. CSW explained to the patient that she has 10 bed offers and would need to pick one.   The wife has stated that she feels rushed and the facilities are not up to her standard. The CSW assured the patients wife that TOC was not rushing her and explained the bed process as she may lose the bed offers as they do not hold them.   The wife stated she understand and stated that she needs more time. At this time TOC will continue to follow .The wife did tour [employer], [employer] and reported to the CSW that she was on the way to [facility] so see if they can accept her husband. TOC also has followed up with facility as offer is still pending.   Expected Discharge Plan: [place] Barriers to Discharge: Continued Medical Work up  Expected Discharge Plan and Services       Living arrangements for the past 2 months: Single Family Home                                       Social Determinants of Health (SDOH) Interventions [place]: No Food Insecurity (03/30/2022)  Housing: Low Risk  (03/30/2022)  Transportation Needs: No Transportation Needs (03/30/2022)  Utilities: Not At Risk (03/30/2022)  Tobacco Use: Low Risk  (03/30/2022)    Readmission Risk Interventions    01/09/2022    1:14 PM  Readmission Risk Prevention Plan  Transportation Screening Complete  PCP or Specialist Appt within 3-5 Days Complete  Social Work Consult for [place] Planning/Counseling Complete  Palliative Care Screening Complete  Medication Review Human resources officer) Complete

## 2022-04-07 NOTE — Progress Notes (Signed)
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  He was initially worked up for possible sepsis from biliary infection but HIDA scan was negative.  General surgery and IR consulted for possible abscess formation but think it is more consistent with postsurgical changes at this time.  He has been treated empirically with cefepime and Flagyl with improvement in his white blood cell count.    Assessment & Plan:   Principal Problem:   Sepsis ([place]) Active Problems:   Renal cell carcinoma ([name])   HTN (hypertension)   Seizure disorder ([name])   Hypercalcemia   Normocytic anemia   1-SIRS;  Sepsis ruled out no source of infection identified.  Leukocytosis -Patient presented with fever, leukocytosis, concern for biliary infection initially.  CT show a small gas collection in the gallbladder fossa after cholecystectomy with possible abscess formation.  HIDA scan was negative.  Surgery and IR do not think there was enough fluid for aspiration.  Surgery did not think there was concern for infection in the gallbladder fossa.  Blood cultures urine culture negative ,  viral panel negative -Treated with IV cefepime and Flagyl for 8 days. Transition to Augmentin.  -Chest x ray negative for PNA. Repeated CT abdomen pelvis negative for abscess.  WBC increase , monitor. Afebrile.   2-Acute metabolic encephalopathy Patient was noted to be more confused than baseline. Status somewhat improved after he was a started on IV antibiotics Worsening mental status multifactorial in the setting of brain metastasis, sepsis. -Delirium precaution -Treating for infection -On  steroids   3-Stage  IV renal cell carcinoma with metastasis to bone, liver lung, brain Seizure disorder Had a recent MRI of the brain with increased brain mets 70s. On  Decadron by oncology Continue with Keppra Dr [name] recommending hospice care. Plan for SNF with palliative care.   4-Urinary retention Gross hematuria AKI  Gross hematuria  after catheter placement. He is leaking through the catheter, fail voiding trial. Foley catheter placed.  Worsening renal function today due to urine retention. Foley catheter placement. C Renal function improved after foley catheter placement.   5-Hypophosphatemia Replete IV  DVT Left LE:  Doppler: acute deep vein thrombosis involving the left  common femoral vein, SF junction, left femoral vein, left proximal  profunda vein, left popliteal vein, left posterior tibial veins, left  peroneal veins, and left gastrocnemius vein Discussed with Dr Learta Codding, recommendation to start Eliquis 5 Mg BID.  TED hose ordered.    Scrotum swelling: stop IV fluid. Scrotal elevation. IV lasix.  Suspect related to volume overload.  If no improvement by tomorrow could order [name].  Discussed with urology, probably volume overload.  Plan to repeat IV lasix.    Normocytic anemia Monitor hb  Hypokalemia; Replaced.    Estimated body mass index is 31.38 kg/m as calculated from the following:   Height as of this encounter: 5' 11"$  (1.803 m).   Weight as of this encounter: 102.1 kg.   DVT prophylaxis: SCD Code Status: DNR Family Communication: Wife at bedside 2/09.   Disposition Plan:  Status is: Inpatient Remains inpatient appropriate because: management of confusion     Consultants:  General Sx Oncology   Procedures:    Antimicrobials:  Subjective: He is alert, denies pain.  LE with less edema. Scrotum edematous, no painful, no redness.   Objective: Vitals:   04/06/22 1317 04/06/22 2107 04/07/22 0543 04/07/22 1321  BP: 135/64 (!) 146/73 137/67 128/66   Pulse: 97 79 71 78  Resp:  17 18 (!) 21  Temp: 98.9 F (37.2 C) 97.6 F (36.4 C) 98.1 F (36.7 C) 99 F (37.2 C)  TempSrc: Oral Oral Oral Oral  SpO2: 99% 99% 100% 100%  Weight:      Height:        Intake/Output Summary (Last 24 hours) at 04/07/2022 1558 Last data filed at 04/07/2022 1040 Gross per 24 hour  Intake 1110.07 ml  Output 2225 ml  Net -1114.93 ml    Filed Weights   03/30/22 1029  Weight: 102.1 kg    Examination:  General exam: NAD Respiratory system: CTA Cardiovascular system: S 1, S 2  RRR Gastrointestinal system: BS present, soft, nt Central nervous system:Alert.  Extremities: edema left more than right.  Scrotal edema  Data Reviewed: I have personally reviewed following labs and imaging studies  CBC: Recent Labs  Lab 04/01/22 0322 04/02/22 0257 04/02/22 1146 04/03/22 1239 04/04/22 0415 04/05/22 0323 04/06/22 0438 04/07/22 0353  WBC 24.8* 15.7*   < > 12.7* 16.8* 14.6* 11.9* 16.0*  NEUTROABS 20.8* 12.6*  --   --   --   --   --   --   HGB 9.0* 7.8*   < > 8.1* 8.5* 7.3* 7.9* 7.6*  HCT 30.4* 26.8*   < > 28.0* 29.1* 25.4* 26.4* 25.6*  MCV 85.9 87.9   < > 87.8 87.1 87.6 86.6 85.6  PLT 503* 390   < > 376 430* 253 229 225   < > = values in this interval not displayed.    Basic Metabolic Panel: Recent Labs  Lab 04/02/22 0257 04/02/22 1146 04/03/22 0040 04/04/22 0415 04/05/22 0323 04/06/22 0438 04/07/22 0353  NA 139   < > 139 141 138 140 136  K 3.7   < > 3.7 3.4* 3.7 3.3* 4.5  CL 111   < > 109 109 106 109 107  CO2 20*   < > 23 23 18* 22 23  GLUCOSE 150*   < > 180* 149* 167* 130* 232*  BUN 24*   < > 18 19 42* 17 16  CREATININE 0.60*   < > 0.52* 0.60* 1.76* 0.69 0.46*  CALCIUM 8.5*   < > 8.6* 8.2* 7.4* 7.4* 7.5*  MG 2.2  --  2.2 2.1 1.9  --  2.2  PHOS 1.8*  --  1.9* 2.3*  --  1.5* 2.2*   < > = values in this interval not displayed.    GFR: Estimated Creatinine Clearance: 94.1 mL/min (A) (by C-G formula based on SCr of 0.46 mg/dL  (L)). Liver Function Tests: Recent Labs  Lab 04/01/22 0322 04/02/22 0257 04/03/22 0040 04/04/22 0415  AST 16 18 18 16  $ ALT 48* 35 34 27  ALKPHOS 125 110 121 108  BILITOT 0.2* 0.5 0.5 0.3  PROT 5.9* 5.6* 6.4* 6.2*  ALBUMIN 2.3* 2.0* 2.5* 2.4*    No results for input(s): "LIPASE", "AMYLASE" in the last 168 hours.  No results for input(s): "AMMONIA" in the last 168 hours. Coagulation Profile: No results for input(s): "INR", "PROTIME" in the last 168 hours.  Cardiac Enzymes: No results for input(s): "CKTOTAL", "CKMB", "CKMBINDEX", "TROPONINI" in the last 168 hours. BNP (last 3 results) No results for  input(s): "PROBNP" in the last 8760 hours. HbA1C: No results for input(s): "HGBA1C" in the last 72 hours. CBG: No results for input(s): "GLUCAP" in the last 168 hours. Lipid Profile: No results for input(s): "CHOL", "HDL", "LDLCALC", "TRIG", "CHOLHDL", "LDLDIRECT" in the last 72 hours. Thyroid Function Tests: No results for input(s): "TSH", "T4TOTAL", "FREET4", "T3FREE", "THYROIDAB" in the last 72 hours. Anemia Panel: No results for input(s): "VITAMINB12", "FOLATE", "FERRITIN", "TIBC", "IRON", "RETICCTPCT" in the last 72 hours. Sepsis Labs: No results for input(s): "PROCALCITON", "LATICACIDVEN" in the last 168 hours.   Recent Results (from the past 240 hour(s))  Blood Culture (routine x 2)     Status: None   Collection Time: 03/30/22 10:48 AM   Specimen: BLOOD LEFT FOREARM  Result Value Ref Range Status   Specimen Description BLOOD LEFT FOREARM  Final   Special Requests   Final    BOTTLES DRAWN AEROBIC AND ANAEROBIC Blood Culture adequate volume   Culture   Final    NO GROWTH 5 DAYS Performed at [facility], [address] [place], [place] [address]    Report Status 04/04/2022 FINAL  Final  Blood Culture (routine x 2)     Status: None   Collection Time: 03/30/22 11:10 AM   Specimen: BLOOD  Result Value Ref Range Status   Specimen Description BLOOD SITE NOT  SPECIFIED  Final   Special Requests   Final    BOTTLES DRAWN AEROBIC AND ANAEROBIC Blood Culture adequate volume   Culture   Final    NO GROWTH 5 DAYS Performed at [facility], [place] [address], [place], [place] [address]    Report Status 04/04/2022 FINAL  Final  Resp panel by RT-PCR (RSV, Flu A&B, Covid) Anterior Nasal Swab     Status: None   Collection Time: 03/30/22 11:43 AM   Specimen: Anterior Nasal Swab  Result Value Ref Range Status   SARS Coronavirus 2 by RT PCR NEGATIVE NEGATIVE Final    Comment: (NOTE) SARS-CoV-2 target nucleic acids are NOT DETECTED.  The SARS-CoV-2 RNA is generally detectable in upper respiratory specimens during the acute phase of infection. The lowest concentration of SARS-CoV-2 viral copies this assay can detect is 138 copies/mL. A negative result does not preclude SARS-Cov-2 infection and should not be used as the sole basis for treatment or other patient management decisions. A negative result may occur with  improper specimen collection/handling, submission of specimen other than nasopharyngeal swab, presence of viral mutation(s) within the areas targeted by this assay, and inadequate number of viral copies(<138 copies/mL). A negative result must be combined with clinical observations, patient history, and epidemiological information. The expected result is Negative.  Fact Sheet for Patients:  EntrepreneurPulse.com.au  Fact Sheet for Healthcare Providers:  IncredibleEmployment.be  This test is no t yet approved or cleared by the [place] FDA and  has been authorized for detection and/or diagnosis of SARS-CoV-2 by FDA under an Emergency Use Authorization (EUA). This EUA will remain  in effect (meaning this test can be used) for the duration of the COVID-19 declaration under Section 564(b)(1) of the Act, 21 U.S.C.section 360bbb-3(b)(1), unless the authorization is terminated  or revoked sooner.        Influenza A by PCR NEGATIVE NEGATIVE Final   Influenza B by PCR NEGATIVE NEGATIVE Final    Comment: (NOTE) The Xpert Xpress SARS-CoV-2/FLU/RSV plus assay is intended as an aid in the diagnosis of influenza from Nasopharyngeal swab specimens and should not be used as  a sole basis for treatment. Nasal washings and aspirates are unacceptable for Xpert Xpress SARS-CoV-2/FLU/RSV testing.  Fact Sheet for Patients: EntrepreneurPulse.com.au  Fact Sheet for Healthcare Providers: IncredibleEmployment.be  This test is not yet approved or cleared by the [place] FDA and has been authorized for detection and/or diagnosis of SARS-CoV-2 by FDA under an Emergency Use Authorization (EUA). This EUA will remain in effect (meaning this test can be used) for the duration of the COVID-19 declaration under Section 564(b)(1) of the Act, 21 U.S.C. section 360bbb-3(b)(1), unless the authorization is terminated or revoked.     Resp Syncytial Virus by PCR NEGATIVE NEGATIVE Final    Comment: (NOTE) Fact Sheet for Patients: EntrepreneurPulse.com.au  Fact Sheet for Healthcare Providers: IncredibleEmployment.be  This test is not yet approved or cleared by the [place] FDA and has been authorized for detection and/or diagnosis of SARS-CoV-2 by FDA under an Emergency Use Authorization (EUA). This EUA will remain in effect (meaning this test can be used) for the duration of the COVID-19 declaration under Section 564(b)(1) of the Act, 21 U.S.C. section 360bbb-3(b)(1), unless the authorization is terminated or revoked.  Performed at [facility], [place] [address], [place], [place] [address]          Radiology Studies: VAS US AORTA/IVC/ILIACS  Result Date: 04/06/2022 IVC/ILIAC STUDY Patient Name:  Phillip [name]  Date of Exam:   04/06/2022 Medical Rec #: [id number]           Accession #:     [id number] Date of Birth: 01/29/1946            Patient Gender: M Patient Age:   81 years Exam Location:  [facility] Procedure:      VAS US AORTA/IVC/ILIACS Referring Phys: Niel Hummer --------------------------------------------------------------------------------  Indications: DVT of LLE Other Factors: Renal cell carcinoma & immobility. Limitations: Obesity and air/bowel gas - patient not NPO.  Comparison Study: No previous exams Performing Technologist: Jody Hill RVT, RDMS  Examination Guidelines: A complete evaluation includes B-mode imaging, spectral Doppler, color Doppler, and power Doppler as needed of all accessible portions of each vessel. Bilateral testing is considered an integral part of a complete examination. Limited examinations for reoccurring indications may be performed as noted.  IVC/Iliac Findings: +----------+------+--------+--------------+    IVC    PatentThrombus   Comments    +----------+------+--------+--------------+ IVC Prox                not visualized +----------+------+--------+--------------+ IVC Mid   patent                       +----------+------+--------+--------------+ IVC Distalpatent                       +----------+------+--------+--------------+  +-------------------+---------+-----------+---------+-----------+--------------+         CIV        RT-PatentRT-ThrombusLT-PatentLT-Thrombus   Comments    +-------------------+---------+-----------+---------+-----------+--------------+ Common Iliac Prox                       patent                            +-------------------+---------+-----------+---------+-----------+--------------+ Common Iliac Mid  not visualized +-------------------+---------+-----------+---------+-----------+--------------+ Common Iliac Distal                                        not visualized  +-------------------+---------+-----------+---------+-----------+--------------+  +-------------------+---------+-----------+---------+-----------------+--------+         EIV        RT-PatentRT-ThrombusLT-Patent   LT-Thrombus   Comments +-------------------+---------+-----------+---------+-----------------+--------+ External Iliac Vein                             age indeterminate         Prox                                                                      +-------------------+---------+-----------+---------+-----------------+--------+ External Iliac Vein                             age indeterminate         Mid                                                                       +-------------------+---------+-----------+---------+-----------------+--------+ External Iliac Vein                             age indeterminate         Distal                                                                    +-------------------+---------+-----------+---------+-----------------+--------+  Continuous flow seen throughout external iliac vein.  Summary: IVC/Iliac: There is no evidence of thrombus involving the IVC. There is no evidence of thrombus involving the left proximal common iliac vein. There is evidence of age indeterminate thrombus involving the left external iliac vein. Proximal Inferior Vena Cava, distal common Iliac and mid common Iliac were not visualized on this exam.  *See table(s) above for measurements and observations.  Electronically signed by Harold Barban MD on 04/06/2022 at 9:28:24 PM.    Final    VAS [name] LOWER EXTREMITY VENOUS (DVT)  Result Date: 04/06/2022  Lower Venous DVT Study Patient Name:  ABUNDIO LEMPERT [facility]  Date of Exam:   04/06/2022 Medical Rec #: [id number]           Accession #:    [id number] Date of Birth: [birth year]            Patient Gender: M Patient Age:   48 years Exam Location:  [facility] Procedure:      VAS [name] LOWER  EXTREMITY VENOUS (DVT) Referring Phys: Jerald Kief Estes Lehner --------------------------------------------------------------------------------  Indications: Edema.  Risk Factors: Immobility Cancer (renal cell carcinoma). Limitations: Poor ultrasound/tissue interface. Comparison Study: No previous exams Performing Technologist: Jody Hill RVT, RDMS  Examination Guidelines: A complete evaluation includes B-mode imaging, spectral Doppler, color Doppler, and power Doppler as needed of all accessible portions of each vessel. Bilateral testing is considered an integral part of a complete examination. Limited examinations for reoccurring indications may be performed as noted. The reflux portion of the exam is performed with the patient in reverse Trendelenburg.  +-----+---------------+---------+-----------+----------+--------------+ RIGHTCompressibilityPhasicitySpontaneityPropertiesThrombus Aging +-----+---------------+---------+-----------+----------+--------------+ CFV  Full           Yes      Yes                                 +-----+---------------+---------+-----------+----------+--------------+   +---------+---------------+---------+-----------+---------------+--------------+ LEFT     CompressibilityPhasicitySpontaneityProperties     Thrombus Aging +---------+---------------+---------+-----------+---------------+--------------+ CFV      None           No       Yes        continuous flowAcute          +---------+---------------+---------+-----------+---------------+--------------+ SFJ      Partial                                           Acute          +---------+---------------+---------+-----------+---------------+--------------+ FV Prox  None           No       No                        Acute          +---------+---------------+---------+-----------+---------------+--------------+ FV Mid   None           No       No                        Acute           +---------+---------------+---------+-----------+---------------+--------------+ FV DistalNone           No       No                        Acute          +---------+---------------+---------+-----------+---------------+--------------+ PFV      None           No       No                        Acute          +---------+---------------+---------+-----------+---------------+--------------+ POP      None           No       No                        Acute          +---------+---------------+---------+-----------+---------------+--------------+ PTV      None           No       No                        Acute          +---------+---------------+---------+-----------+---------------+--------------+  PERO     None           No       No                        Acute          +---------+---------------+---------+-----------+---------------+--------------+ Gastroc  None           No       No                        Acute          +---------+---------------+---------+-----------+---------------+--------------+    Summary: RIGHT: - No evidence of common femoral vein obstruction.  LEFT: - Findings consistent with acute deep vein thrombosis involving the left common femoral vein, SF junction, left femoral vein, left proximal profunda vein, left popliteal vein, left posterior tibial veins, left peroneal veins, and left gastrocnemius veins. - There is no evidence of superficial venous thrombosis.  - No cystic structure found in the popliteal fossa.  *See table(s) above for measurements and observations. Electronically signed by Harold Barban MD on 04/06/2022 at 9:26:00 PM.    Final    CT ABDOMEN PELVIS WO CONTRAST  Result Date: 04/05/2022 CLINICAL DATA:  Sepsis workup. History of renal cell carcinoma with lumbar metastases. EXAM: CT ABDOMEN AND PELVIS WITHOUT CONTRAST TECHNIQUE: Multidetector CT imaging of the abdomen and pelvis was performed following the standard protocol without IV  contrast. RADIATION DOSE REDUCTION: This exam was performed according to the departmental dose-optimization program which includes automated exposure control, adjustment of the mA and/or kV according to patient size and/or use of iterative reconstruction technique. COMPARISON:  03/30/2022 and 01/08/2022, 02/08/2020 FINDINGS: Lower chest: Central venous catheter tip over the right cavoatrial junction. Mild opacification over the right lower lobe likely scarring/atelectasis worse compared to the recent prior exam. Heart is normal size. Hepatobiliary: Previous cholecystectomy. Stable 1.5 cm hypodensity of the central left lobe of the liver as this is unchanged from 02/08/2020 and likely a cyst. Biliary tree is unremarkable. Pancreas: Normal. Spleen: Normal. Adrenals/Urinary Tract: Right adrenal gland is normal. 1.3 cm left adrenal nodule unchanged from 03/30/2022 and indeterminate as this was not well visualized on the previous study from 01/08/2022. Kidneys are normal in size. No nephrolithiasis or focal mass. No significant hydronephrosis. Ureters are normal. Foley catheter is present within a decompressed bladder. Stomach/Bowel: Stomach and small bowel are normal. Appendix is not visualized. Colon is unremarkable. Vascular/Lymphatic: Mild calcified plaque of the abdominal aorta which is normal in caliber. A few small nodes along the left iliac chain unchanged. Reproductive: Normal. Other: Nausea findings edema over the lower abdomen/pelvis. Very minimal patchy free fluid over the lower abdomen/pelvis. No significant focal inflammatory change. Musculoskeletal: Multiple destructive lytic bone lesions of the pelvis and spine without significant change and compatible with known metastatic disease. IMPRESSION: 1. No acute findings in the abdomen/pelvis. 2. Mild opacification over the right lower lobe likely scarring/atelectasis worse compared to the recent prior exam. 3. 1.3 cm left adrenal nodule unchanged from  03/30/2022 and and may represent metastatic disease as this was not well visualized on the previous study from 01/08/2022. Recommend attention on follow-up. 4. Multiple destructive lytic bone lesions of the pelvis and spine without significant change and compatible with known osseous metastatic disease. 5. 1.5 cm hypodensity over the central left lobe of the liver unchanged from 02/08/2020 and likely a cyst. 6. Foley catheter within a decompressed  bladder. 7. Aortic atherosclerosis. Aortic Atherosclerosis (ICD10-I70.0). Electronically Signed   By: Marin Olp M.D.   On: 04/05/2022 16:16        Scheduled Meds:  amoxicillin-clavulanate  1 tablet Oral Q12H   apixaban  5 mg Oral BID   Chlorhexidine Gluconate Cloth  6 each Topical Daily   dexamethasone  4 mg Oral Daily   levETIRAcetam  250 mg Oral BID   mouth rinse  15 mL Mouth Rinse 4 times per day   oxyCODONE  10-20 mg Oral Daily   oxyCODONE  20 mg Oral QHS   testosterone  5 g Transdermal Daily   Continuous Infusions:  sodium phosphate 15 mmol in dextrose 5 % 250 mL infusion 15 mmol (04/07/22 1301)      LOS: 8 days    Time spent: 35 minutes    Coltyn Hanning A Timberlynn Kizziah, MD [facility]   If 7PM-7AM, please contact night-coverage [email or web address]  04/07/2022, 3:58 PM

## 2022-04-08 DIAGNOSIS — C649 Malignant neoplasm of unspecified kidney, except renal pelvis: Secondary | ICD-10-CM | POA: Diagnosis not present

## 2022-04-08 LAB — CBC
HCT: 25.4 % — ABNORMAL LOW (ref 39.0–52.0)
Hemoglobin: 7.6 g/dL — ABNORMAL LOW (ref 13.0–17.0)
MCH: 25.5 pg — ABNORMAL LOW (ref 26.0–34.0)
MCHC: 29.9 g/dL — ABNORMAL LOW (ref 30.0–36.0)
MCV: 85.2 fL (ref 80.0–100.0)
Platelets: 245 10*3/uL (ref 150–400)
RBC: 2.98 MIL/uL — ABNORMAL LOW (ref 4.22–5.81)
RDW: 18 % — ABNORMAL HIGH (ref 11.5–15.5)
WBC: 16.4 10*3/uL — ABNORMAL HIGH (ref 4.0–10.5)
nRBC: 0 % (ref 0.0–0.2)

## 2022-04-08 LAB — PHOSPHORUS
Phosphorus: 1.7 mg/dL — ABNORMAL LOW (ref 2.5–4.6)
Phosphorus: 2.8 mg/dL (ref 2.5–4.6)

## 2022-04-08 LAB — BASIC METABOLIC PANEL
Anion gap: 10 (ref 5–15)
BUN: 15 mg/dL (ref 8–23)
CO2: 25 mmol/L (ref 22–32)
Calcium: 7.7 mg/dL — ABNORMAL LOW (ref 8.9–10.3)
Chloride: 102 mmol/L (ref 98–111)
Creatinine, Ser: 0.45 mg/dL — ABNORMAL LOW (ref 0.61–1.24)
GFR, Estimated: >60 mL/min (ref >60)
Glucose, Bld: 183 mg/dL — ABNORMAL HIGH (ref 70–99)
Potassium: 4.5 mmol/L (ref 3.5–5.1)
Sodium: 137 mmol/L (ref 135–145)

## 2022-04-08 MED ORDER — FUROSEMIDE 10 MG/ML IJ SOLN
20.0000 mg | Freq: Once | INTRAMUSCULAR | Status: AC
  Administered 2022-04-08: 20 mg via INTRAVENOUS
  Filled 2022-04-08: qty 2

## 2022-04-08 MED ORDER — SODIUM PHOSPHATES 45 MMOLE/15ML IV SOLN
45.0000 mmol | Freq: Once | INTRAVENOUS | Status: AC
  Administered 2022-04-08: 45 mmol via INTRAVENOUS
  Filled 2022-04-08: qty 15

## 2022-04-08 NOTE — Progress Notes (Signed)
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  He was initially worked up for possible sepsis from biliary infection but HIDA scan was negative.  General surgery and IR consulted for possible abscess formation but think it is more consistent with postsurgical changes at this time.  He has been treated empirically with cefepime and Flagyl with improvement in his white blood cell count.    Assessment & Plan:   Principal Problem:   Sepsis ([place]) Active Problems:   Renal cell carcinoma ([name])   HTN (hypertension)   Seizure disorder ([name])   Hypercalcemia   Normocytic anemia   1-SIRS;  Sepsis ruled out no source of infection identified.  Leukocytosis -Patient presented with fever, leukocytosis, concern for biliary infection initially.  CT show a small gas collection in the gallbladder fossa after cholecystectomy with possible abscess formation.  HIDA scan was negative.  Surgery and IR do not think there was enough fluid for aspiration.  Surgery did not think there was concern for infection in the gallbladder fossa.  Blood cultures urine culture negative ,  viral panel negative -Treated with IV cefepime and Flagyl for 8 days. Transition to Augmentin.  -Chest x ray negative for PNA. Repeated CT abdomen pelvis negative for abscess.  -WBC stable at 16  monitor. Afebrile.   2-Acute metabolic encephalopathy Patient was noted to be more confused than baseline. Status somewhat improved after he was a started on IV antibiotics Worsening mental status multifactorial in the setting of brain metastasis, sepsis. -Delirium precaution -Treating for infection -On  steroids Improved.     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 On  Decadron by oncology Continue with Keppra Dr [name] recommending hospice care. Plan for SNF with palliative care.   4-Urinary retention Gross hematuria AKI  Gross hematuria  after catheter placement. He is leaking through the catheter, fail voiding trial. Foley catheter placed.  Worsening renal function today due to urine retention. Foley catheter placement. C Renal function improved after foley catheter placement.   5-Hypophosphatemia Replete IV  DVT Left LE:  Doppler: acute deep vein thrombosis involving the left  common femoral vein, SF junction, left femoral vein, left proximal  profunda vein, left popliteal vein, left posterior tibial veins, left  peroneal veins, and left gastrocnemius vein Discussed with Dr Learta Codding, recommendation to start Eliquis 5 Mg BID.  TED hose ordered.    Scrotum swelling: stop IV fluid. Scrotal elevation. IV lasix.  Suspect related to volume overload.  If no improvement by tomorrow could order [name].  Discussed with urology, probably volume overload.  Plan to repeat IV lasix.   Swelling improving.   Normocytic anemia Monitor hb  Hypokalemia; Replaced.    Estimated body mass index is 31.38 kg/m as calculated from the following:   Height as of this encounter: 5' 11"$  (1.803 m).   Weight as of this encounter: 102.1 kg.   DVT prophylaxis: SCD Code Status: DNR Family Communication: Wife at bedside 2/11.   Disposition Plan:  Status is: Inpatient Remains inpatient appropriate because: Awaiting SNF, medical stable.     Consultants:  General Sx Oncology  Procedures:    Antimicrobials:    Subjective: Scrotum edema improved. He is more talkative and alert.   Objective: Vitals:   04/07/22 2050 04/08/22 0605 04/08/22 1107 04/08/22 1349  BP: (!) 126/58 127/73 (!) 135/55  125/61  Pulse: 70 81 79 70  Resp: 20 20 20 17  $ Temp: 98.5 F (36.9 C) 98.6 F (37 C) 97.8 F (36.6 C) 98.4 F (36.9 C)  TempSrc: Oral Oral Oral   SpO2: 98% 94% 100% 100%  Weight:      Height:        Intake/Output Summary (Last 24 hours) at 04/08/2022 1640 Last data filed at 04/08/2022 1549 Gross per 24 hour  Intake 645.24 ml  Output 3300 ml  Net -2654.76 ml    Filed Weights   03/30/22 1029  Weight: 102.1 kg    Examination:  General exam: NAD Respiratory system: CTA Cardiovascular system: S 1, S 2  RRR Gastrointestinal system: BS present, soft, nt Central nervous system: Alert Extremities: Edema decreasing.  Scrotal edema decreasing.   Data Reviewed: I have personally reviewed following labs and imaging studies  CBC: Recent Labs  Lab 04/02/22 0257 04/02/22 1146 04/04/22 0415 04/05/22 0323 04/06/22 0438 04/07/22 0353 04/08/22 0249  WBC 15.7*   < > 16.8* 14.6* 11.9* 16.0* 16.4*  NEUTROABS 12.6*  --   --   --   --   --   --   HGB 7.8*   < > 8.5* 7.3* 7.9* 7.6* 7.6*  HCT 26.8*   < > 29.1* 25.4* 26.4* 25.6* 25.4*  MCV 87.9   < > 87.1 87.6 86.6 85.6 85.2  PLT 390   < > 430* 253 229 225 245   < > = values in this interval not displayed.    Basic Metabolic Panel: Recent Labs  Lab 04/02/22 0257 04/02/22 1146 04/03/22 0040 04/04/22 0415 04/05/22 0323 04/06/22 0438 04/07/22 0353 04/08/22 0249  NA 139   < > 139 141 138 140 136 137  K 3.7   < > 3.7 3.4* 3.7 3.3* 4.5 4.5  CL 111   < > 109 109 106 109 107 102  CO2 20*   < > 23 23 18* 22 23 25  $ GLUCOSE 150*   < > 180* 149* 167* 130* 232* 183*  BUN 24*   < > 18 19 42* 17 16 15  $ CREATININE 0.60*   < > 0.52* 0.60* 1.76* 0.69 0.46* 0.45*  CALCIUM 8.5*   < > 8.6* 8.2* 7.4* 7.4* 7.5* 7.7*  MG 2.2  --  2.2 2.1 1.9  --  2.2  --   PHOS 1.8*  --  1.9* 2.3*  --  1.5* 2.2* 1.7*   < > = values in this interval not displayed.    GFR: Estimated Creatinine Clearance: 94.1 mL/min (A) (by C-G formula based on SCr of 0.45  mg/dL (L)). Liver Function Tests: Recent Labs  Lab 04/02/22 0257 04/03/22 0040 04/04/22 0415  AST 18 18 16  $ ALT 35 34 27  ALKPHOS 110 121 108  BILITOT 0.5 0.5 0.3  PROT 5.6* 6.4* 6.2*  ALBUMIN 2.0* 2.5* 2.4*    No results for input(s): "LIPASE", "AMYLASE" in the last 168 hours.  No results for input(s): "AMMONIA" in the last 168 hours. Coagulation Profile: No results for input(s): "INR", "PROTIME" in the last 168 hours.  Cardiac Enzymes: No results for input(s): "CKTOTAL", "CKMB", "CKMBINDEX", "TROPONINI" in the last 168 hours. BNP (last 3 results) No results for input(s): "PROBNP"  in the last 8760 hours. HbA1C: No results for input(s): "HGBA1C" in the last 72 hours. CBG: No results for input(s): "GLUCAP" in the last 168 hours. Lipid Profile: No results for input(s): "CHOL", "HDL", "LDLCALC", "TRIG", "CHOLHDL", "LDLDIRECT" in the last 72 hours. Thyroid Function Tests: No results for input(s): "TSH", "T4TOTAL", "FREET4", "T3FREE", "THYROIDAB" in the last 72 hours. Anemia Panel: No results for input(s): "VITAMINB12", "FOLATE", "FERRITIN", "TIBC", "IRON", "RETICCTPCT" in the last 72 hours. Sepsis Labs: No results for input(s): "PROCALCITON", "LATICACIDVEN" in the last 168 hours.   Recent Results (from the past 240 hour(s))  Blood Culture (routine x 2)     Status: None   Collection Time: 03/30/22 10:48 AM   Specimen: BLOOD LEFT FOREARM  Result Value Ref Range Status   Specimen Description BLOOD LEFT FOREARM  Final   Special Requests   Final    BOTTLES DRAWN AEROBIC AND ANAEROBIC Blood Culture adequate volume   Culture   Final    NO GROWTH 5 DAYS Performed at [facility], [address] [place], [place] [address]    Report Status 04/04/2022 FINAL  Final  Blood Culture (routine x 2)     Status: None   Collection Time: 03/30/22 11:10 AM   Specimen: BLOOD  Result Value Ref Range Status   Specimen Description BLOOD SITE NOT SPECIFIED  Final   Special Requests    Final    BOTTLES DRAWN AEROBIC AND ANAEROBIC Blood Culture adequate volume   Culture   Final    NO GROWTH 5 DAYS Performed at [facility], [place] [address] [place], [place] [address]    Report Status 04/04/2022 FINAL  Final  Resp panel by RT-PCR (RSV, Flu A&B, Covid) Anterior Nasal Swab     Status: None   Collection Time: 03/30/22 11:43 AM   Specimen: Anterior Nasal Swab  Result Value Ref Range Status   SARS Coronavirus 2 by RT PCR NEGATIVE NEGATIVE Final    Comment: (NOTE) SARS-CoV-2 target nucleic acids are NOT DETECTED.  The SARS-CoV-2 RNA is generally detectable in upper respiratory specimens during the acute phase of infection. The lowest concentration of SARS-CoV-2 viral copies this assay can detect is 138 copies/mL. A negative result does not preclude SARS-Cov-2 infection and should not be used as the sole basis for treatment or other patient management decisions. A negative result may occur with  improper specimen collection/handling, submission of specimen other than nasopharyngeal swab, presence of viral mutation(s) within the areas targeted by this assay, and inadequate number of viral copies(<138 copies/mL). A negative result must be combined with clinical observations, patient history, and epidemiological information. The expected result is Negative.  Fact Sheet for Patients:  EntrepreneurPulse.com.au  Fact Sheet for Healthcare Providers:  IncredibleEmployment.be  This test is no t yet approved or cleared by the [place] FDA and  has been authorized for detection and/or diagnosis of SARS-CoV-2 by FDA under an Emergency Use Authorization (EUA). This EUA will remain  in effect (meaning this test can be used) for the duration of the COVID-19 declaration under Section 564(b)(1) of the Act, 21 U.S.C.section 360bbb-3(b)(1), unless the authorization is terminated  or revoked sooner.       Influenza A by PCR NEGATIVE  NEGATIVE Final   Influenza B by PCR NEGATIVE NEGATIVE Final    Comment: (NOTE) The Xpert Xpress SARS-CoV-2/FLU/RSV plus assay is intended as an aid in the diagnosis of influenza from Nasopharyngeal swab specimens and should not be used as a sole  basis for treatment. Nasal washings and aspirates are unacceptable for Xpert Xpress SARS-CoV-2/FLU/RSV testing.  Fact Sheet for Patients: EntrepreneurPulse.com.au  Fact Sheet for Healthcare Providers: IncredibleEmployment.be  This test is not yet approved or cleared by the [place] FDA and has been authorized for detection and/or diagnosis of SARS-CoV-2 by FDA under an Emergency Use Authorization (EUA). This EUA will remain in effect (meaning this test can be used) for the duration of the COVID-19 declaration under Section 564(b)(1) of the Act, 21 U.S.C. section 360bbb-3(b)(1), unless the authorization is terminated or revoked.     Resp Syncytial Virus by PCR NEGATIVE NEGATIVE Final    Comment: (NOTE) Fact Sheet for Patients: EntrepreneurPulse.com.au  Fact Sheet for Healthcare Providers: IncredibleEmployment.be  This test is not yet approved or cleared by the [place] FDA and has been authorized for detection and/or diagnosis of SARS-CoV-2 by FDA under an Emergency Use Authorization (EUA). This EUA will remain in effect (meaning this test can be used) for the duration of the COVID-19 declaration under Section 564(b)(1) of the Act, 21 U.S.C. section 360bbb-3(b)(1), unless the authorization is terminated or revoked.  Performed at [facility], [place] [address], [place], [place] [address]          Radiology Studies: No results found.      Scheduled Meds:  amoxicillin-clavulanate  1 tablet Oral Q12H   apixaban  5 mg Oral BID   Chlorhexidine Gluconate Cloth  6 each Topical Daily   dexamethasone  4 mg Oral Daily    levETIRAcetam  250 mg Oral BID   mouth rinse  15 mL Mouth Rinse 4 times per day   oxyCODONE  10-20 mg Oral Daily   oxyCODONE  20 mg Oral QHS   testosterone  5 g Transdermal Daily   Continuous Infusions:  sodium phosphate 45 mmol in dextrose 5 % 250 mL infusion 45 mmol (04/08/22 1115)      LOS: 9 days    Time spent: 35 minutes    Fuller Makin A Careem Yasui, MD [facility]   If 7PM-7AM, please contact night-coverage [email or web address]  04/08/2022, 4:40 PM

## 2022-04-09 DIAGNOSIS — A419 Sepsis, unspecified organism: Secondary | ICD-10-CM | POA: Diagnosis not present

## 2022-04-09 LAB — CBC
HCT: 24.8 % — ABNORMAL LOW (ref 39.0–52.0)
Hemoglobin: 7.4 g/dL — ABNORMAL LOW (ref 13.0–17.0)
MCH: 25.2 pg — ABNORMAL LOW (ref 26.0–34.0)
MCHC: 29.8 g/dL — ABNORMAL LOW (ref 30.0–36.0)
MCV: 84.4 fL (ref 80.0–100.0)
Platelets: 245 10*3/uL (ref 150–400)
RBC: 2.94 MIL/uL — ABNORMAL LOW (ref 4.22–5.81)
RDW: 18 % — ABNORMAL HIGH (ref 11.5–15.5)
WBC: 12.5 10*3/uL — ABNORMAL HIGH (ref 4.0–10.5)
nRBC: 0 % (ref 0.0–0.2)

## 2022-04-09 LAB — BASIC METABOLIC PANEL
Anion gap: 9 (ref 5–15)
BUN: 14 mg/dL (ref 8–23)
CO2: 27 mmol/L (ref 22–32)
Calcium: 7.7 mg/dL — ABNORMAL LOW (ref 8.9–10.3)
Chloride: 98 mmol/L (ref 98–111)
Creatinine, Ser: 0.52 mg/dL — ABNORMAL LOW (ref 0.61–1.24)
GFR, Estimated: >60 mL/min (ref >60)
Glucose, Bld: 157 mg/dL — ABNORMAL HIGH (ref 70–99)
Potassium: 4.1 mmol/L (ref 3.5–5.1)
Sodium: 134 mmol/L — ABNORMAL LOW (ref 135–145)

## 2022-04-09 MED ORDER — FERROUS SULFATE 325 (65 FE) MG PO TABS
325.0000 mg | ORAL_TABLET | Freq: Every day | ORAL | Status: DC
  Administered 2022-04-09 – 2022-04-19 (×11): 325 mg via ORAL
  Filled 2022-04-09 (×11): qty 1

## 2022-04-09 NOTE — TOC Progression Note (Addendum)
Transition of Care [facility]) - Progression Note    Patient Details  Name: Phillip [name] MRN: [id number] Date of Birth: 1945-03-06  Transition of Care [name]) CM/SW [place], LCSW Phone Number: 04/09/2022, 10:27 AM  Clinical Narrative:     TOC CSW spoke with pt's wife and she is refusing to choose SNF bed. CSW explained to pt's wife pt is medically stable and still has to go through insurance authorization. Pt's wife reported she is wanting to hear from [facility]. CSW explained [facility] has not responded to any of CSW calls and has not accepted pt in the hub. CSW informed pt's wife she will need to choose a bed from the 11 offers pt has received. Pt's wife stated she will call CSW back, stating she is taking her son to the doctor. CSW notified MD and [facility] leadership. TOC to follow.   ADDEN 12:10pm CSW received call from pt's wife , she has accepted [facility]'s bed offer. CSW will call [facility]TA to start insurance auth.   Auth pending.TOC to follow.  Expected Discharge Plan: [place] Barriers to Discharge: Continued Medical Work up  Expected Discharge Plan and Services       Living arrangements for the past 2 months: Single Family Home                                       Social Determinants of Health (SDOH) Interventions [place]: No Food Insecurity (03/30/2022)  Housing: Low Risk  (03/30/2022)  Transportation Needs: No Transportation Needs (03/30/2022)  Utilities: Not At Risk (03/30/2022)  Tobacco Use: Low Risk  (03/30/2022)    Readmission Risk Interventions    01/09/2022    1:14 PM  Readmission Risk Prevention Plan  Transportation Screening Complete  PCP or Specialist Appt within 3-5 Days Complete  Social Work Consult for [place] Planning/Counseling Complete  Palliative Care Screening Complete  Medication Review Press photographer) Complete

## 2022-04-09 NOTE — Progress Notes (Signed)
Physical Therapy Treatment Patient Details Name: Phillip [name] MRN: [id number] DOB: [birth year] Today's Date: 04/09/2022   History of Present Illness Pt is a 77 year old male admitted for Sepsis secondary to biliary infection and with history of S4 RCC w/ liver, pulm and brain mets.  PMHx including but not limited to anemia, CKD, renal CA, HTN, seizure, cholecystitis in 11/23 with cholecystostomy tube placed - and lap cholecystectomy 03/15/22.  A brain MRI this week confirmed increase in the size of a dominant left frontal lobe metastasis with mild worsening of edema.    PT Comments    Pt performed LE exercises in supine and then assisted with sitting EOB.  Pt able to hold upright posture once sitting EOB today and even performed LAQs.  Pt fatigued quickly however and assisted back to supine and repositioned with pillows (heels floated).  Pt's cognition also appears improved since previous sessions, and he was able to follow simple commands and talkative today.  Pt with nasal cannula only in half in place on arrival to room so removed oxygen for session.  SPO2 96% on room air at rest and remained in upper 90s during movement.  Spo2 95% on room air upon leaving room and RN notified.  Continue to recommend SNF upon d/c.   Recommendations for follow up therapy are one component of a multi-disciplinary discharge planning process, led by the attending physician.  Recommendations may be updated based on patient status, additional functional criteria and insurance authorization.  Follow Up Recommendations  Skilled nursing-short term rehab (<3 hours/day) Can patient physically be transported by private vehicle: No   Assistance Recommended at Discharge Frequent or constant Supervision/Assistance  Patient can return home with the following Two people to help with walking and/or transfers;Two people to help with bathing/dressing/bathroom;Help with stairs or ramp for entrance;Assistance with  cooking/housework;Assist for transportation;Assistance with feeding;Direct supervision/assist for medications management   Equipment Recommendations  None recommended by PT    Recommendations for Other Services       Precautions / Restrictions Precautions Precautions: Fall     Mobility  Bed Mobility Overal bed mobility: Needs Assistance Bed Mobility: Supine to Sit, Sit to Supine     Supine to sit: Max assist, +2 for physical assistance Sit to supine: +2 for physical assistance, Total assist   General bed mobility comments: pt attempting to assist with upper body using rails; cues for technique; also attempting to assist with moving LEs over to EOB; pt still requires increased assist however initiating and attempting to assist as able    Transfers                   General transfer comment: LEs too weak to stand (grossly 2+/5 throughout bil LEs, also reports pain with R LE movement and Lt LE more swollen however also with DVT)    Ambulation/Gait                   Stairs             Wheelchair Mobility    Modified Rankin (Stroke Patients Only)       Balance Overall balance assessment: Needs assistance Sitting-balance support: No upper extremity supported, Feet supported Sitting balance-Leahy Scale: Fair                                      Cognition Arousal/Alertness: Awake/alert Behavior During Therapy: [facility]  for tasks assessed/performed Overall Cognitive Status: No family/caregiver present to determine baseline cognitive functioning                                 General Comments: pt able to follow simple commands, pt more talkative today and also thanked therapy staff for assisting him end of session        Exercises General Exercises - Lower Extremity Ankle Circles/Pumps: AROM, Both, 10 reps Quad Sets: AROM, Both, 10 reps Long Arc Quad: AROM, Both, 10 reps, Seated Heel Slides: 10 reps, AAROM, Supine,  Both Hip ABduction/ADduction: AAROM, Supine, Both, 10 reps    General Comments        Pertinent Vitals/Pain Pain Assessment Pain Assessment: Faces Faces Pain Scale: Hurts little more Pain Location: scrotum Pain Descriptors / Indicators: Discomfort, Grimacing Pain Intervention(s): Repositioned, Monitored during session    Home Living                          Prior Function            PT Goals (current goals can now be found in the care plan section) Progress towards PT goals: Progressing toward goals    Frequency    Min 2X/week      PT Plan Current plan remains appropriate    Co-evaluation              AM-PAC PT "6 Clicks" Mobility   Outcome Measure  Help needed turning from your back to your side while in a flat bed without using bedrails?: A Lot Help needed moving from lying on your back to sitting on the side of a flat bed without using bedrails?: A Lot Help needed moving to and from a bed to a chair (including a wheelchair)?: Total Help needed standing up from a chair using your arms (e.g., wheelchair or bedside chair)?: Total Help needed to walk in hospital room?: Total Help needed climbing 3-5 steps with a railing? : Total 6 Click Score: 8    End of Session   Activity Tolerance: Patient tolerated treatment well Patient left: in bed;with call bell/phone within reach;with bed alarm set Nurse Communication: Mobility status PT Visit Diagnosis: Muscle weakness (generalized) (M62.81)     Time: [id number] PT Time Calculation (min) (ACUTE ONLY): 24 min  Charges:  $Therapeutic Exercise: 8-22 mins $Therapeutic Activity: 8-22 mins                     Jannette Spanner PT, DPT Physical Therapist Acute Rehabilitation Services Preferred contact method: Secure Chat Weekend Pager Only: [phone] Office: [phone]    [name] [place] 04/09/2022, 2:20 PM

## 2022-04-09 NOTE — Care Management Important Message (Signed)
Important Message  Patient Details  Name: Phillip [name] MRN: [id number] Date of Birth: Mar 11, 1945   Medicare Important Message Given:  Yes     Memory Argue 04/09/2022, 4:33 PM

## 2022-04-09 NOTE — Progress Notes (Signed)
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  He was initially worked up for possible sepsis from biliary infection but HIDA scan was negative.  General surgery and IR consulted for possible abscess formation but think it is more consistent with postsurgical changes at this time.  He has been treated empirically with cefepime and Flagyl with improvement in his white blood cell count.    Assessment & Plan:   Principal Problem:   Sepsis ([place]) Active Problems:   Renal cell carcinoma ([name])   HTN (hypertension)   Seizure disorder ([name])   Hypercalcemia   Normocytic anemia   1-SIRS;  Sepsis ruled out no source of infection identified.  Leukocytosis -Patient presented with fever, leukocytosis, concern for biliary infection initially.  CT show a small gas collection in the gallbladder fossa after cholecystectomy with possible abscess formation.  HIDA scan was negative.  Surgery and IR do not think there was enough fluid for aspiration.  Surgery did not think there was concern for infection in the gallbladder fossa.  Blood cultures urine culture negative ,  viral panel negative -Treated with IV cefepime and Flagyl for 8 days. Transition to Augmentin.  -Chest x ray negative for PNA. Repeated CT abdomen pelvis negative for abscess.  -WBC stable at 12  monitor. Afebrile.   2-Acute metabolic encephalopathy Patient was noted to be more confused than baseline. Status somewhat improved after he was a started on IV antibiotics Worsening mental status multifactorial in the setting of brain metastasis, sepsis. -Delirium precaution -Treating for infection -On   steroids -Improved.    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 On  Decadron by oncology Continue with Keppra Dr [name] recommending hospice care. Plan for SNF with palliative care.   4-Urinary retention Gross hematuria AKI  Gross hematuria  after catheter placement. He is leaking through the catheter, fail voiding trial. Foley catheter placed.  Worsening renal function today due to urine retention. Foley catheter placement. C Renal function improved after foley catheter placement.  Needs to discharge with foley catheter.   5-Hypophosphatemia Replaced.   DVT Left LE:  Doppler: acute deep vein thrombosis involving the left  common femoral vein, SF junction, left femoral vein, left proximal  profunda vein, left popliteal vein, left posterior tibial veins, left  peroneal veins, and left gastrocnemius vein Discussed with Dr Learta Codding, recommendation to start Eliquis 5 Mg BID.  TED hose ordered.  Edema improved.   Scrotum swelling: Stop IV fluid. Scrotal elevation. Received IV lasix.  Suspect related to volume overload.  If no improvement by tomorrow could order [name].  Discussed with urology, probably volume overload.  Swelling improving.   Normocytic anemia Monitor hb Monitor hb transfusion for hb less than 7.   Hypokalemia; Replaced.    Estimated body mass index is 31.38 kg/m as calculated from the following:   Height as of this encounter: 5' 11"$  (1.803 m).   Weight as of this encounter: 102.1 kg.   DVT prophylaxis: SCD Code Status: DNR Family Communication: Wife at bedside 2/11.   Disposition Plan:  Status is: Inpatient Remains inpatient appropriate because: Awaiting SNF, medical  stable for rehab.     Consultants:  General Sx Oncology   Procedures:    Antimicrobials:    Subjective: He is alert, answer questions.  Denies pain.    Objective: Vitals:   04/08/22 1349 04/08/22 2210 04/09/22 0438 04/09/22 1313  BP: 125/61 128/61 125/67 (!) 123/57  Pulse: 70 71 70 87  Resp: 17 17 17 17  $ Temp: 98.4 F (36.9 C) 98.7 F (37.1 C) 98.7 F (37.1 C) 98.1 F (36.7 C)  TempSrc:  Oral Oral Oral  SpO2: 100% 100% 100% 94%  Weight:      Height:        Intake/Output Summary (Last 24 hours) at 04/09/2022 1522 Last data filed at 04/09/2022 1322 Gross per 24 hour  Intake 730 ml  Output 2450 ml  Net -1720 ml    Filed Weights   03/30/22 1029  Weight: 102.1 kg    Examination:  General exam: NAD Respiratory system: CTA Cardiovascular system: S 1, S 2  RRR Gastrointestinal system: BS Present, soft, nt Central nervous system: alert, follows command Extremities: edema decreasing.  Scrotal edema decreasing.   Data Reviewed: I have personally reviewed following labs and imaging studies  CBC: Recent Labs  Lab 04/05/22 0323 04/06/22 0438 04/07/22 0353 04/08/22 0249 04/09/22 1101  WBC 14.6* 11.9* 16.0* 16.4* 12.5*  HGB 7.3* 7.9* 7.6* 7.6* 7.4*  HCT 25.4* 26.4* 25.6* 25.4* 24.8*  MCV 87.6 86.6 85.6 85.2 84.4  PLT 253 229 225 245 [id number]    Basic Metabolic Panel: Recent Labs  Lab 04/03/22 0040 04/04/22 0415 04/05/22 0323 04/06/22 0438 04/07/22 0353 04/08/22 0249 04/08/22 2231 04/09/22 1101  NA 139 141 138 140 136 137  --  134*  K 3.7 3.4* 3.7 3.3* 4.5 4.5  --  4.1  CL 109 109 106 109 107 102  --  98  CO2 23 23 18* 22 23 25  $ --  27  GLUCOSE 180* 149* 167* 130* 232* 183*  --  157*  BUN 18 19 42* 17 16 15  $ --  14  CREATININE 0.52* 0.60* 1.76* 0.69 0.46* 0.45*  --  0.52*  CALCIUM 8.6* 8.2* 7.4* 7.4* 7.5* 7.7*  --  7.7*  MG 2.2 2.1 1.9  --  2.2  --   --   --   PHOS 1.9* 2.3*  --  1.5* 2.2* 1.7* 2.8  --     GFR: Estimated Creatinine Clearance: 94.1 mL/min (A) (by C-G formula based on SCr of 0.52 mg/dL (L)). Liver Function Tests: Recent Labs  Lab 04/03/22 0040 04/04/22 0415  AST 18 16  ALT 34 27   ALKPHOS 121 108  BILITOT 0.5 0.3  PROT 6.4* 6.2*  ALBUMIN 2.5* 2.4*    No results for input(s): "LIPASE", "AMYLASE" in the last 168 hours.  No results for input(s): "AMMONIA" in the last 168 hours. Coagulation Profile: No results for input(s): "INR", "PROTIME" in the last 168 hours.  Cardiac Enzymes: No results for input(s): "CKTOTAL", "CKMB", "CKMBINDEX", "TROPONINI" in the last 168 hours. BNP (last 3 results) No results for input(s): "PROBNP" in the last 8760 hours. HbA1C: No results for input(s): "HGBA1C" in the last 72 hours. CBG: No results for input(s): "GLUCAP" in the last 168 hours. Lipid Profile: No results for input(s): "CHOL", "HDL", "LDLCALC", "TRIG", "CHOLHDL", "LDLDIRECT" in the last 72 hours. Thyroid Function Tests: No results for input(s): "TSH", "T4TOTAL", "FREET4", "T3FREE", "THYROIDAB" in the last 72 hours. Anemia Panel: No results for input(s): "VITAMINB12", "FOLATE", "FERRITIN", "TIBC", "IRON", "  RETICCTPCT" in the last 72 hours. Sepsis Labs: No results for input(s): "PROCALCITON", "LATICACIDVEN" in the last 168 hours.   No results found for this or any previous visit (from the past 240 hour(s)).        Radiology Studies: No results found.      Scheduled Meds:  apixaban  5 mg Oral BID   Chlorhexidine Gluconate Cloth  6 each Topical Daily   dexamethasone  4 mg Oral Daily   ferrous sulfate  325 mg Oral Q breakfast   levETIRAcetam  250 mg Oral BID   mouth rinse  15 mL Mouth Rinse 4 times per day   oxyCODONE  10-20 mg Oral Daily   oxyCODONE  20 mg Oral QHS   testosterone  5 g Transdermal Daily   Continuous Infusions:      LOS: 10 days    Time spent: 35 minutes    Kassia Demarinis A Antwine Agosto, MD [facility]   If 7PM-7AM, please contact night-coverage [email or web address]  04/09/2022, 3:22 PM

## 2022-04-10 ENCOUNTER — Other Ambulatory Visit (HOSPITAL_COMMUNITY): Payer: Self-pay

## 2022-04-10 DIAGNOSIS — C649 Malignant neoplasm of unspecified kidney, except renal pelvis: Secondary | ICD-10-CM | POA: Diagnosis not present

## 2022-04-10 DIAGNOSIS — I82402 Acute embolism and thrombosis of unspecified deep veins of left lower extremity: Secondary | ICD-10-CM | POA: Diagnosis not present

## 2022-04-10 LAB — BASIC METABOLIC PANEL
Anion gap: 10 (ref 5–15)
BUN: 14 mg/dL (ref 8–23)
CO2: 25 mmol/L (ref 22–32)
Calcium: 8.1 mg/dL — ABNORMAL LOW (ref 8.9–10.3)
Chloride: 97 mmol/L — ABNORMAL LOW (ref 98–111)
Creatinine, Ser: 0.62 mg/dL (ref 0.61–1.24)
GFR, Estimated: >60 mL/min (ref >60)
Glucose, Bld: 291 mg/dL — ABNORMAL HIGH (ref 70–99)
Potassium: 4.3 mmol/L (ref 3.5–5.1)
Sodium: 132 mmol/L — ABNORMAL LOW (ref 135–145)

## 2022-04-10 LAB — HEMOGLOBIN AND HEMATOCRIT, BLOOD
HCT: 26.2 % — ABNORMAL LOW (ref 39.0–52.0)
Hemoglobin: 7.9 g/dL — ABNORMAL LOW (ref 13.0–17.0)

## 2022-04-10 MED ORDER — FUROSEMIDE 10 MG/ML IJ SOLN
20.0000 mg | Freq: Once | INTRAMUSCULAR | Status: AC
  Administered 2022-04-10: 20 mg via INTRAVENOUS
  Filled 2022-04-10: qty 2

## 2022-04-10 NOTE — TOC Progression Note (Signed)
Transition of Care [facility]) - Progression Note    Patient Details  Name: Phillip [name] MRN: [id number] Date of Birth: 1945-05-13  Transition of Care [facility]) CM/SW [place], LCSW Phone Number: 04/10/2022, 3:09 PM  Clinical Narrative:    CSW received a call from [facility] pt was approved for EMS transport [place] [id number] Pt was not approved for SNF placement and requesting a Peer to Peer. CSW has provided information to MD. TOC to follow.   Expected Discharge Plan: [place] Barriers to Discharge: Continued Medical Work up  Expected Discharge Plan and Services       Living arrangements for the past 2 months: Single Family Home                                       Social Determinants of Health (SDOH) Interventions [place]: No Food Insecurity (03/30/2022)  Housing: Low Risk  (03/30/2022)  Transportation Needs: No Transportation Needs (03/30/2022)  Utilities: Not At Risk (03/30/2022)  Tobacco Use: Low Risk  (03/30/2022)    Readmission Risk Interventions    01/09/2022    1:14 PM  Readmission Risk Prevention Plan  Transportation Screening Complete  PCP or Specialist Appt within 3-5 Days Complete  Social Work Consult for [place] Planning/Counseling Complete  Palliative Care Screening Complete  Medication Review Press photographer) Complete

## 2022-04-10 NOTE — Plan of Care (Signed)
  Problem: Fluid Volume: Goal: Hemodynamic stability will improve Outcome: Progressing   Problem: Clinical Measurements: Goal: Diagnostic test results will improve Outcome: Progressing Goal: Signs and symptoms of infection will decrease Outcome: Progressing   Problem: Respiratory: Goal: Ability to maintain adequate ventilation will improve Outcome: Progressing   Problem: Health Behavior/Discharge Planning: Goal: Ability to manage health-related needs will improve Outcome: Progressing   Problem: Activity: Goal: Risk for activity intolerance will decrease Outcome: Progressing   Problem: Safety: Goal: Ability to remain free from injury will improve Outcome: Progressing   Problem: Skin Integrity: Goal: Risk for impaired skin integrity will decrease Outcome: Progressing

## 2022-04-10 NOTE — Progress Notes (Signed)
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  He was initially worked up for possible sepsis from biliary infection but HIDA scan was negative.  General surgery and IR consulted for possible abscess formation but think it is more consistent with postsurgical changes at this time.  He has been treated empirically with cefepime and Flagyl with improvement in his white blood cell count.    Assessment & Plan:   Principal Problem:   Sepsis ([place]) Active Problems:   Renal cell carcinoma ([name])   HTN (hypertension)   Seizure disorder ([name])   Hypercalcemia   Normocytic anemia   1-SIRS;  Sepsis ruled out no source of infection identified.  Leukocytosis -Patient presented with fever, leukocytosis, concern for biliary infection initially.  CT show a small gas collection in the gallbladder fossa after cholecystectomy with possible abscess formation.  HIDA scan was negative.  Surgery and IR do not think there was enough fluid for aspiration.  Surgery did not think there was concern for infection in the gallbladder fossa.  Blood cultures urine culture negative ,  viral panel negative -Treated with IV cefepime and Flagyl for 8 days. 2 days  Augmentin. Completed 10 days antibiotics.  -Chest x ray negative for PNA. Repeated CT abdomen pelvis negative for abscess.  -WBC stable at 12  monitor. Afebrile.   2-Acute metabolic encephalopathy Patient was noted to be more confused than baseline. Status somewhat improved after he was a started on IV antibiotics Worsening mental status multifactorial in the setting of brain metastasis, sepsis. -Delirium precaution -Treating for  infection -On  steroids -Improved.   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 On  Decadron by oncology Continue with Keppra Dr [name] recommending hospice care. Plan for SNF with palliative care.   4-Urinary retention Gross hematuria AKI  Gross hematuria  after catheter placement. He is leaking through the catheter, fail voiding trial. Foley catheter placed.  Worsening renal function today due to urine retention. Foley catheter placement. C Renal function improved after foley catheter placement.  Needs to discharge with foley catheter.   5-Hypophosphatemia Replaced.   DVT Left LE:  Doppler: acute deep vein thrombosis involving the left  common femoral vein, SF junction, left femoral vein, left proximal  profunda vein, left popliteal vein, left posterior tibial veins, left  peroneal veins, and left gastrocnemius vein Discussed with Dr Learta Codding, recommendation to start Eliquis 5 Mg BID.  TED hose ordered.  Edema improved.   Scrotum swelling: Stop IV fluid. Scrotal elevation. Received IV lasix.  Suspect related to volume overload.  If no improvement by tomorrow could order [name].  Discussed with urology, probably volume overload.  Swelling improving. Will give a dose of lasix today   Normocytic anemia Monitor hb Monitor hb transfusion for hb less than 7.   Hypokalemia; Replaced.    Estimated body mass index is 31.38 kg/m as calculated from the following:   Height as of this encounter: 5' 11"$  (1.803 m).   Weight as of this encounter: 102.1 kg.   DVT prophylaxis: SCD Code Status: DNR Family Communication: Wife at bedside 2/11.   Disposition Plan:  Status is: Inpatient Remains inpatient appropriate because: Awaiting SNF, medical stable for rehab.     Consultants:  General Sx Oncology   Procedures:    Antimicrobials:    Subjective: No new  complaints. He is eating ok. Denies pain.  He is answering questions.   Objective: Vitals:   04/09/22 0438 04/09/22 1313 04/09/22 1937 04/10/22 0355  BP: 125/67 (!) 123/57 (!) 125/59 126/64  Pulse: 70 87 90 76  Resp: 17 17 19   $ Temp: 98.7 F (37.1 C) 98.1 F (36.7 C) 99.1 F (37.3 C) 98.6 F (37 C)  TempSrc: Oral Oral Oral Oral  SpO2: 100% 94% 97% 98%  Weight:      Height:        Intake/Output Summary (Last 24 hours) at 04/10/2022 1342 Last data filed at 04/09/2022 2300 Gross per 24 hour  Intake 240 ml  Output 1900 ml  Net -1660 ml    Filed Weights   03/30/22 1029  Weight: 102.1 kg    Examination:  General exam: NAD Respiratory system: CTA Cardiovascular system: S 1, S 2 RRR Gastrointestinal system: BS present, soft, nt Central nervous system: Alert, follows command Extremities: Edema improved.  Scrotal edema decreasing.   Data Reviewed: I have personally reviewed following labs and imaging studies  CBC: Recent Labs  Lab 04/05/22 0323 04/06/22 0438 04/07/22 0353 04/08/22 0249 04/09/22 1101 04/10/22 0341  WBC 14.6* 11.9* 16.0* 16.4* 12.5*  --   HGB 7.3* 7.9* 7.6* 7.6* 7.4* 7.9*  HCT 25.4* 26.4* 25.6* 25.4* 24.8* 26.2*  MCV 87.6 86.6 85.6 85.2 84.4  --   PLT 253 229 225 245 245  --     Basic Metabolic Panel: Recent Labs  Lab 04/04/22 0415 04/05/22 0323 04/06/22 0438 04/07/22 0353 04/08/22 0249 04/08/22 2231 04/09/22 1101  NA 141 138 140 136 137  --  134*  K 3.4* 3.7 3.3* 4.5 4.5  --  4.1  CL 109 106 109 107 102  --  98  CO2 23 18* 22 23 25  $ --  27  GLUCOSE 149* 167* 130* 232* 183*  --  157*  BUN 19 42* 17 16 15  $ --  14  CREATININE 0.60* 1.76* 0.69 0.46* 0.45*  --  0.52*  CALCIUM 8.2* 7.4* 7.4* 7.5* 7.7*  --  7.7*  MG 2.1 1.9  --  2.2  --   --   --   PHOS 2.3*  --  1.5* 2.2* 1.7* 2.8  --     GFR: Estimated Creatinine Clearance: 94.1 mL/min (A) (by C-G formula based on SCr of 0.52 mg/dL (L)). Liver Function Tests: Recent Labs  Lab  04/04/22 0415  AST 16  ALT 27  ALKPHOS 108  BILITOT 0.3  PROT 6.2*  ALBUMIN 2.4*    No results for input(s): "LIPASE", "AMYLASE" in the last 168 hours.  No results for input(s): "AMMONIA" in the last 168 hours. Coagulation Profile: No results for input(s): "INR", "PROTIME" in the last 168 hours.  Cardiac Enzymes: No results for input(s): "CKTOTAL", "CKMB", "CKMBINDEX", "TROPONINI" in the last 168 hours. BNP (last 3 results) No results for input(s): "PROBNP" in the last 8760 hours. HbA1C: No results for input(s): "HGBA1C" in the last 72 hours. CBG: No results for input(s): "GLUCAP" in the last 168 hours. Lipid Profile: No results for input(s): "CHOL", "HDL", "LDLCALC", "TRIG", "CHOLHDL", "LDLDIRECT" in the last 72 hours. Thyroid Function Tests: No results for input(s): "TSH", "T4TOTAL", "FREET4", "T3FREE", "THYROIDAB" in the last 72 hours. Anemia Panel:  No results for input(s): "VITAMINB12", "FOLATE", "FERRITIN", "TIBC", "IRON", "RETICCTPCT" in the last 72 hours. Sepsis Labs: No results for input(s): "PROCALCITON", "LATICACIDVEN" in the last 168 hours.   No results found for this or any previous visit (from the past 240 hour(s)).        Radiology Studies: No results found.      Scheduled Meds:  apixaban  5 mg Oral BID   Chlorhexidine Gluconate Cloth  6 each Topical Daily   dexamethasone  4 mg Oral Daily   ferrous sulfate  325 mg Oral Q breakfast   levETIRAcetam  250 mg Oral BID   mouth rinse  15 mL Mouth Rinse 4 times per day   oxyCODONE  10-20 mg Oral Daily   oxyCODONE  20 mg Oral QHS   testosterone  5 g Transdermal Daily   Continuous Infusions:      LOS: 11 days    Time spent: 35 minutes    Avaleigh Decuir A Toribio Seiber, MD [facility]   If 7PM-7AM, please contact night-coverage [email or web address]  04/10/2022, 1:42 PM

## 2022-04-10 NOTE — Plan of Care (Signed)
  Problem: Education: Goal: Knowledge of General Education information will improve Description Including pain rating scale, medication(s)/side effects and non-pharmacologic comfort measures Outcome: Progressing   Problem: Health Behavior/Discharge Planning: Goal: Ability to manage health-related needs will improve Outcome: Progressing   

## 2022-04-11 ENCOUNTER — Other Ambulatory Visit: Payer: Self-pay

## 2022-04-11 ENCOUNTER — Other Ambulatory Visit (HOSPITAL_COMMUNITY): Payer: Self-pay

## 2022-04-11 DIAGNOSIS — R6510 Systemic inflammatory response syndrome (SIRS) of non-infectious origin without acute organ dysfunction: Secondary | ICD-10-CM | POA: Diagnosis not present

## 2022-04-11 DIAGNOSIS — C7931 Secondary malignant neoplasm of brain: Secondary | ICD-10-CM

## 2022-04-11 DIAGNOSIS — I82409 Acute embolism and thrombosis of unspecified deep veins of unspecified lower extremity: Secondary | ICD-10-CM | POA: Insufficient documentation

## 2022-04-11 DIAGNOSIS — I82402 Acute embolism and thrombosis of unspecified deep veins of left lower extremity: Secondary | ICD-10-CM | POA: Diagnosis not present

## 2022-04-11 DIAGNOSIS — C649 Malignant neoplasm of unspecified kidney, except renal pelvis: Secondary | ICD-10-CM | POA: Diagnosis not present

## 2022-04-11 DIAGNOSIS — G40909 Epilepsy, unspecified, not intractable, without status epilepticus: Secondary | ICD-10-CM

## 2022-04-11 LAB — CBC
HCT: 27.5 % — ABNORMAL LOW (ref 39.0–52.0)
Hemoglobin: 8.3 g/dL — ABNORMAL LOW (ref 13.0–17.0)
MCH: 25.1 pg — ABNORMAL LOW (ref 26.0–34.0)
MCHC: 30.2 g/dL (ref 30.0–36.0)
MCV: 83.1 fL (ref 80.0–100.0)
Platelets: 365 10*3/uL (ref 150–400)
RBC: 3.31 MIL/uL — ABNORMAL LOW (ref 4.22–5.81)
RDW: 17.6 % — ABNORMAL HIGH (ref 11.5–15.5)
WBC: 15.7 10*3/uL — ABNORMAL HIGH (ref 4.0–10.5)
nRBC: 0 % (ref 0.0–0.2)

## 2022-04-11 LAB — BASIC METABOLIC PANEL
Anion gap: 10 (ref 5–15)
BUN: 14 mg/dL (ref 8–23)
CO2: 27 mmol/L (ref 22–32)
Calcium: 8.5 mg/dL — ABNORMAL LOW (ref 8.9–10.3)
Chloride: 97 mmol/L — ABNORMAL LOW (ref 98–111)
Creatinine, Ser: 0.51 mg/dL — ABNORMAL LOW (ref 0.61–1.24)
GFR, Estimated: >60 mL/min (ref >60)
Glucose, Bld: 158 mg/dL — ABNORMAL HIGH (ref 70–99)
Potassium: 4.1 mmol/L (ref 3.5–5.1)
Sodium: 134 mmol/L — ABNORMAL LOW (ref 135–145)

## 2022-04-11 MED ORDER — APIXABAN 5 MG PO TABS
5.0000 mg | ORAL_TABLET | Freq: Two times a day (BID) | ORAL | 0 refills | Status: DC
  Filled 2022-04-11: qty 60, 30d supply, fill #0

## 2022-04-11 MED ORDER — FERROUS SULFATE 325 (65 FE) MG PO TABS: 325.0000 mg | ORAL_TABLET | Freq: Every day | ORAL | 3 refills | Status: DC

## 2022-04-11 NOTE — Progress Notes (Signed)
Physical Therapy Treatment Patient Details Name: Phillip [name] MRN: [id number] DOB: 23-Jan-1946 Today's Date: 04/11/2022   History of Present Illness Pt is a 77 year old male admitted for Sepsis secondary to biliary infection and with history of S4 RCC w/ liver, pulm and brain mets.  PMHx including but not limited to anemia, CKD, renal CA, HTN, seizure, cholecystitis in 11/23 with cholecystostomy tube placed - and lap cholecystectomy 03/15/22.  A brain MRI this week confirmed increase in the size of a dominant left frontal lobe metastasis with mild worsening of edema.    PT Comments    The patient is alert and  conversant. Patient assisted to sitting with max/total assist . Patient able to  sit with min guard once gained balance using rail and foot board.  Patient stood x 3  with UE support and max support of 2 persons.  Continue PT.  Recommendations for follow up therapy are one component of a multi-disciplinary discharge planning process, led by the attending physician.  Recommendations may be updated based on patient status, additional functional criteria and insurance authorization.  Follow Up Recommendations  Skilled nursing-short term rehab (<3 hours/day) Can patient physically be transported by private vehicle: No   Assistance Recommended at Discharge Frequent or constant Supervision/Assistance  Patient can return home with the following Two people to help with walking and/or transfers;Two people to help with bathing/dressing/bathroom;Help with stairs or ramp for entrance;Assistance with cooking/housework;Assist for transportation;Assistance with feeding;Direct supervision/assist for medications management   Equipment Recommendations  None recommended by PT    Recommendations for Other Services       Precautions / Restrictions Precautions Precautions: Fall Restrictions Weight Bearing Restrictions: No     Mobility  Bed Mobility         Supine to sit: Max assist, +2  for physical assistance Sit to supine: +2 for physical assistance, Total assist   General bed mobility comments: pt attempting to assist with upper body using rails; cues for technique; also attempting to assist with moving LEs over to EOB; pt still requires increased assist however initiating and attempting to assist as able    Transfers Overall transfer level: Needs assistance   Transfers: Sit to/from Stand Sit to Stand: Max assist, +2 physical assistance, +2 safety/equipment           General transfer comment: [name] turned backwards to patient, patient able to reach for horizontal bar, max assist of 2 to power  to stnad, stood  x 3 briefly<1 min each> does demonstrate improved  ability to assist and bear weight    Ambulation/Gait                   Stairs             Wheelchair Mobility    Modified Rankin (Stroke Patients Only)       Balance Overall balance assessment: Needs assistance Sitting-balance support: No upper extremity supported, Feet supported Sitting balance-Leahy Scale: Fair     Standing balance support: Bilateral upper extremity supported, During functional activity, Reliant on assistive device for balance Standing balance-Leahy Scale: Poor                              Cognition Arousal/Alertness: Awake/alert Behavior During Therapy: WFL for tasks assessed/performed Overall Cognitive Status: No family/caregiver present to determine baseline cognitive functioning Area of Impairment: Problem solving  General Comments: pt able to follow simple commands, pt more talkative today and also thanked therapy staff for assisting him end of session        Exercises      General Comments        Pertinent Vitals/Pain Pain Assessment Pain Assessment: No/denies pain    Home Living                          Prior Function            PT Goals (current goals can now be  found in the care plan section) Progress towards PT goals: Progressing toward goals    Frequency    Min 2X/week      PT Plan Current plan remains appropriate    Co-evaluation              AM-PAC PT "6 Clicks" Mobility   Outcome Measure  Help needed turning from your back to your side while in a flat bed without using bedrails?: A Lot Help needed moving from lying on your back to sitting on the side of a flat bed without using bedrails?: A Lot Help needed moving to and from a bed to a chair (including a wheelchair)?: Total Help needed standing up from a chair using your arms (e.g., wheelchair or bedside chair)?: Total Help needed to walk in hospital room?: Total Help needed climbing 3-5 steps with a railing? : Total 6 Click Score: 8    End of Session Equipment Utilized During Treatment: Gait belt Activity Tolerance: Patient tolerated treatment well Patient left: in bed;with call bell/phone within reach;with bed alarm set Nurse Communication: Mobility status;Need for lift equipment PT Visit Diagnosis: Muscle weakness (generalized) (M62.81)     Time: [id number] PT Time Calculation (min) (ACUTE ONLY): 26 min  Charges:  $Therapeutic Activity: 23-37 mins                     [place] Office [phone] Weekend [id number]    Claretha Cooper 04/11/2022, 12:58 PM

## 2022-04-11 NOTE — TOC Progression Note (Signed)
Transition of Care [facility]) - Progression Note    Patient Details  Name: Phillip [name] MRN: [id number] Date of Birth: 1945-09-29  Transition of Care [facility]) CM/SW [place], LCSW Phone Number: 04/11/2022, 10:16 AM  Clinical Narrative:     Pt's SNF [facility] was denied. CSW spoke with pt's wife regarding denial and provided [employer] paperwork. CSW explained if she has any questions or concerns she will need to contact [facility], which their contact number is on the paperwork. TOC to follow.  Expected Discharge Plan: [place] Barriers to Discharge: Continued Medical Work up  Expected Discharge Plan and Services       Living arrangements for the past 2 months: Single Family Home                                       Social Determinants of Health (SDOH) Interventions [place]: No Food Insecurity (03/30/2022)  Housing: Low Risk  (03/30/2022)  Transportation Needs: No Transportation Needs (03/30/2022)  Utilities: Not At Risk (03/30/2022)  Tobacco Use: Low Risk  (03/30/2022)    Readmission Risk Interventions    01/09/2022    1:14 PM  Readmission Risk Prevention Plan  Transportation Screening Complete  PCP or Specialist Appt within 3-5 Days Complete  Social Work Consult for [place] Planning/Counseling Complete  Palliative Care Screening Complete  Medication Review Press photographer) Complete

## 2022-04-11 NOTE — Progress Notes (Signed)
SLP Cancellation Note  Patient Details Name: Phillip [name] MRN: [id number] DOB: Apr 06, 1945   Cancelled treatment:        Note appeal for SNF in progress and exchange with case manager and pt's wife.  RN reported on 2/13 that pt was taking in po and tolerating.  Given above circumstances and pt's tolerance of po, will follow up next day for education with wife and assure of po tolerance/indication for further evaluation.     [name] 04/11/2022, 5:30 PM    [name], MS [facility] SLP Acute Rehab Services Office [phone] .

## 2022-04-11 NOTE — Discharge Summary (Signed)
Physician Discharge Summary  Phillip [name] [id number] DOB: 1946-01-13 DOA: 03/30/2022  PCP: Charlane Ferretti, MD  Admit date: 03/30/2022 Discharge date: 04/11/2022 Discharging to: home with PT Recommendations for Outpatient Follow-up:  Outpt follow up with urology recommended Going home with foley- high risk for hematuria due to [employer] has denied SNF- will go home F/u BP as outpt it has been normal and Losartan/ HCTZ discontinued for now     Discharge Diagnoses:   Principal Problem:   SIRS (systemic inflammatory response syndrome) ([place]) Active Problems:   Acute metabolic encephalopathy   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 Initial workup for possible sepsis secondary to biliary infection was inconclusive with negative HIDA scan. Consultations with General Surgery and Interventional Radiology suggested postsurgical changes rather than abscess formation. The patient has shown improvement in white blood cell count with empiric treatment using cefepime and Flagyl.    SIRS- Sepsis ruled out, leukocytosis: Despite concerns for biliary infection and possible abscess formation, no definitive source identified. Completed 10-day course of IV antibiotics (cefepime, Flagyl, Augmentin) with stable leukocyte count and resolution of fever. Negative blood, urine, and viral cultures. CT abdomen pelvis negative for abscess. Patient afebrile, stable.  Acute metabolic encephalopathy: Confusion likely multifactorial: brain metastasis,  sepsis. Improved with IV antibiotics and steroids.  Urinary retention, gross hematuria, AKI: Catheter placement resulted in gross hematuria and urinary retention. Foley catheter placement improved renal function.  Failed voiding trial and will discharge with Foley catheter.   Hypophosphatemia: Replaced phosphorus.    Acute DVT Left LE,  > left common femoral vein, SF junction, left femoral vein, left proximal profunda vein, left popliteal vein, left posterior tibial veins, left peroneal veins, and left gastrocnemius vein  Initiated Eliquis 5 mg BID after discussion with oncology, Dr Ammie Dalton - TED hose ordered. Edema improved.  Scrotal edema Suspected scrotal swelling related to volume overload. 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 On Decadron for brain metastases. Continue Keppra for seizure control. Palliative care recommended.   Normocytic anemia: stable  Hypokalemia: Replaced potassium.     Estimated BMI is 31.38 kg/m, indicating obesity. Continue multidisciplinary approach to address complex medical issues and provide comprehensive supportive care. Regular reassessment and adjustment of treatment plans as necessary.      Discharge Instructions  Discharge Instructions     Increase activity slowly   Complete by: As directed       Allergies as of 04/11/2022       Reactions   Contrast Media [iodinated Contrast Media] Rash        Medication List     STOP taking these medications    losartan-hydrochlorothiazide 100-12.5 MG tablet Commonly known as: HYZAAR       TAKE these medications    apixaban 5 MG Tabs tablet Commonly known as: ELIQUIS Take 1 tablet (5 mg total) by mouth 2 (two) times daily.   Colace 100 MG capsule Generic drug: docusate sodium Take 200  mg by mouth 2 (two) times daily.   dexamethasone 4 MG tablet Commonly known as:  DECADRON Take 1 tablet (4 mg total) by mouth daily. Take for 14 days   dorzolamide 2 % ophthalmic solution Commonly known as: TRUSOPT Instill 1 drop into right eye three times daily.   ferrous sulfate 325 (65 FE) MG tablet Take 1 tablet (325 mg total) by mouth daily with breakfast. Start taking on: April 12, 2022   levETIRAcetam 500 MG tablet Commonly known as: KEPPRA Take 1 tablet (500 mg total) by mouth 2 (two) times daily. What changed: how much to take   lidocaine-prilocaine cream Commonly known as: EMLA APPLY TO PORTACATH 1 HOUR PRIOR TO USE AS NEEDED What changed:  how much to take how to take this when to take this reasons to take this   mirtazapine 30 MG tablet Commonly known as: REMERON Take 1 tablet (30 mg total) by mouth at bedtime.   ondansetron 8 MG tablet Commonly known as: ZOFRAN Take 1 tablet (8 mg total) by mouth every 8 (eight) hours as needed for nausea for vomiting.   OxyCONTIN 10 mg 12 hr tablet Generic drug: oxyCODONE Take 2 tablets (20 mg total) by mouth every 12 (twelve) hours. What changed:  how much to take when to take this additional instructions   oxyCODONE 5 MG immediate release tablet Commonly known as: Oxy IR/ROXICODONE Take 1 tablet (5 mg total) by mouth every 6 (six) hours as needed for severe pain. What changed: Another medication with the same name was changed. Make sure you understand how and when to take each.   polyethylene glycol 17 g packet Commonly known as: MIRALAX / GLYCOLAX Take 17 g by mouth daily as needed for moderate constipation.   prochlorperazine 10 MG tablet Commonly known as: COMPAZINE Take 1 tablet by mouth every 6 hours as needed for nausea or vomiting.   saccharomyces boulardii 250 MG capsule Commonly known as: FLORASTOR Take 250 mg by mouth 2 (two) times daily.   Testosterone 20.25 MG/ACT (1.62%) Gel Apply 2 Pumps topically daily.   Vitamin B12 1000 MCG Tbcr Take 1,000 mcg by mouth daily.    Vitamin D 50 MCG (2000 UT) Caps Take 2,000 Units by mouth daily.        Contact information for after-discharge care     Destination     [facility] .   Service: Skilled Nursing Contact information: [address] [place] [phone]                        The results of significant diagnostics from this hospitalization (including imaging, microbiology, ancillary and laboratory) are listed below for reference.    VAS US AORTA/IVC/ILIACS  Result Date: 04/06/2022 IVC/ILIAC STUDY Patient Name:  Phillip [name]  Date of Exam:   04/06/2022 Medical Rec #: [id number]           Accession #:    [id number] Date of Birth: 01-28-1946            Patient Gender: M Patient Age:   77 years Exam Location:  [facility] Procedure:      VAS US AORTA/IVC/ILIACS Referring Phys: Niel Hummer --------------------------------------------------------------------------------  Indications: DVT of LLE Other Factors: Renal cell carcinoma & immobility. Limitations: Obesity and air/bowel gas - patient not NPO.  Comparison Study: No previous exams Performing Technologist: Jody Hill RVT, RDMS  Examination Guidelines: A complete evaluation includes B-mode imaging, spectral Doppler, color  Doppler, and power Doppler as needed of all accessible portions of each vessel. Bilateral testing is considered an integral part of a complete examination. Limited examinations for reoccurring indications may be performed as noted.  IVC/Iliac Findings: +----------+------+--------+--------------+    IVC    PatentThrombus   Comments    +----------+------+--------+--------------+ IVC Prox                not visualized +----------+------+--------+--------------+ IVC Mid   patent                       +----------+------+--------+--------------+ IVC Distalpatent                       +----------+------+--------+--------------+   +-------------------+---------+-----------+---------+-----------+--------------+         CIV        RT-PatentRT-ThrombusLT-PatentLT-Thrombus   Comments    +-------------------+---------+-----------+---------+-----------+--------------+ Common Iliac Prox                       patent                            +-------------------+---------+-----------+---------+-----------+--------------+ Common Iliac Mid                                           not visualized +-------------------+---------+-----------+---------+-----------+--------------+ Common Iliac Distal                                        not visualized +-------------------+---------+-----------+---------+-----------+--------------+  +-------------------+---------+-----------+---------+-----------------+--------+         EIV        RT-PatentRT-ThrombusLT-Patent   LT-Thrombus   Comments +-------------------+---------+-----------+---------+-----------------+--------+ External Iliac Vein                             age indeterminate         Prox                                                                      +-------------------+---------+-----------+---------+-----------------+--------+ External Iliac Vein                             age indeterminate         Mid                                                                       +-------------------+---------+-----------+---------+-----------------+--------+ External Iliac Vein                             age indeterminate         Distal                                                                    +-------------------+---------+-----------+---------+-----------------+--------+  Continuous flow seen throughout external iliac vein.  Summary: IVC/Iliac: There is no evidence of thrombus involving the IVC. There is no evidence of thrombus involving the left proximal common iliac vein. There is evidence of age indeterminate  thrombus involving the left external iliac vein. Proximal Inferior Vena Cava, distal common Iliac and mid common Iliac were not visualized on this exam.  *See table(s) above for measurements and observations.  Electronically signed by Harold Barban MD on 04/06/2022 at 9:28:24 PM.    Final    VAS [name] LOWER EXTREMITY VENOUS (DVT)  Result Date: 04/06/2022  Lower Venous DVT Study Patient Name:  Phillip [name]  Date of Exam:   04/06/2022 Medical Rec #: [id number]           Accession #:    [id number] Date of Birth: 11-01-1945            Patient Gender: M Patient Age:   38 years Exam Location:  [facility] Procedure:      VAS [name] LOWER EXTREMITY VENOUS (DVT) Referring Phys: Jerald Kief REGALADO --------------------------------------------------------------------------------  Indications: Edema.  Risk Factors: Immobility Cancer (renal cell carcinoma). Limitations: Poor ultrasound/tissue interface. Comparison Study: No previous exams Performing Technologist: Jody Hill RVT, RDMS  Examination Guidelines: A complete evaluation includes B-mode imaging, spectral Doppler, color Doppler, and power Doppler as needed of all accessible portions of each vessel. Bilateral testing is considered an integral part of a complete examination. Limited examinations for reoccurring indications may be performed as noted. The reflux portion of the exam is performed with the patient in reverse Trendelenburg.  +-----+---------------+---------+-----------+----------+--------------+ RIGHTCompressibilityPhasicitySpontaneityPropertiesThrombus Aging +-----+---------------+---------+-----------+----------+--------------+ CFV  Full           Yes      Yes                                 +-----+---------------+---------+-----------+----------+--------------+   +---------+---------------+---------+-----------+---------------+--------------+ LEFT     CompressibilityPhasicitySpontaneityProperties     Thrombus Aging  +---------+---------------+---------+-----------+---------------+--------------+ CFV      None           No       Yes        continuous flowAcute          +---------+---------------+---------+-----------+---------------+--------------+ SFJ      Partial                                           Acute          +---------+---------------+---------+-----------+---------------+--------------+ FV Prox  None           No       No                        Acute          +---------+---------------+---------+-----------+---------------+--------------+ FV Mid   None           No       No                        Acute          +---------+---------------+---------+-----------+---------------+--------------+ FV DistalNone           No       No  Acute          +---------+---------------+---------+-----------+---------------+--------------+ PFV      None           No       No                        Acute          +---------+---------------+---------+-----------+---------------+--------------+ POP      None           No       No                        Acute          +---------+---------------+---------+-----------+---------------+--------------+ PTV      None           No       No                        Acute          +---------+---------------+---------+-----------+---------------+--------------+ PERO     None           No       No                        Acute          +---------+---------------+---------+-----------+---------------+--------------+ Gastroc  None           No       No                        Acute          +---------+---------------+---------+-----------+---------------+--------------+    Summary: RIGHT: - No evidence of common femoral vein obstruction.  LEFT: - Findings consistent with acute deep vein thrombosis involving the left common femoral vein, SF junction, left femoral vein, left proximal profunda vein, left popliteal  vein, left posterior tibial veins, left peroneal veins, and left gastrocnemius veins. - There is no evidence of superficial venous thrombosis.  - No cystic structure found in the popliteal fossa.  *See table(s) above for measurements and observations. Electronically signed by Harold Barban MD on 04/06/2022 at 9:26:00 PM.    Final    CT ABDOMEN PELVIS WO CONTRAST  Result Date: 04/05/2022 CLINICAL DATA:  Sepsis workup. History of renal cell carcinoma with lumbar metastases. EXAM: CT ABDOMEN AND PELVIS WITHOUT CONTRAST TECHNIQUE: Multidetector CT imaging of the abdomen and pelvis was performed following the standard protocol without IV contrast. RADIATION DOSE REDUCTION: This exam was performed according to the departmental dose-optimization program which includes automated exposure control, adjustment of the mA and/or kV according to patient size and/or use of iterative reconstruction technique. COMPARISON:  03/30/2022 and 01/08/2022, 02/08/2020 FINDINGS: Lower chest: Central venous catheter tip over the right cavoatrial junction. Mild opacification over the right lower lobe likely scarring/atelectasis worse compared to the recent prior exam. Heart is normal size. Hepatobiliary: Previous cholecystectomy. Stable 1.5 cm hypodensity of the central left lobe of the liver as this is unchanged from 02/08/2020 and likely a cyst. Biliary tree is unremarkable. Pancreas: Normal. Spleen: Normal. Adrenals/Urinary Tract: Right adrenal gland is normal. 1.3 cm left adrenal nodule unchanged from 03/30/2022 and indeterminate as this was not well visualized on the previous study from 01/08/2022. Kidneys are normal in size. No nephrolithiasis or focal mass. No significant hydronephrosis. Ureters are normal. Foley catheter is present  within a decompressed bladder. Stomach/Bowel: Stomach and small bowel are normal. Appendix is not visualized. Colon is unremarkable. Vascular/Lymphatic: Mild calcified plaque of the abdominal aorta which is  normal in caliber. A few small nodes along the left iliac chain unchanged. Reproductive: Normal. Other: Nausea findings edema over the lower abdomen/pelvis. Very minimal patchy free fluid over the lower abdomen/pelvis. No significant focal inflammatory change. Musculoskeletal: Multiple destructive lytic bone lesions of the pelvis and spine without significant change and compatible with known metastatic disease. IMPRESSION: 1. No acute findings in the abdomen/pelvis. 2. Mild opacification over the right lower lobe likely scarring/atelectasis worse compared to the recent prior exam. 3. 1.3 cm left adrenal nodule unchanged from 03/30/2022 and and may represent metastatic disease as this was not well visualized on the previous study from 01/08/2022. Recommend attention on follow-up. 4. Multiple destructive lytic bone lesions of the pelvis and spine without significant change and compatible with known osseous metastatic disease. 5. 1.5 cm hypodensity over the central left lobe of the liver unchanged from 02/08/2020 and likely a cyst. 6. Foley catheter within a decompressed bladder. 7. Aortic atherosclerosis. Aortic Atherosclerosis (ICD10-I70.0). Electronically Signed   By: Marin Olp M.D.   On: 04/05/2022 16:16   DG Chest 2 View  Result Date: 04/05/2022 CLINICAL DATA:  Leukocytosis EXAM: CHEST - 2 VIEW COMPARISON:  03/30/2022 FINDINGS: Port in the anterior chest wall with tip in distal SVC. Normal mediastinum and cardiac silhouette. Normal pulmonary vasculature. No evidence of effusion, infiltrate, or pneumothorax. No acute bony abnormality. IMPRESSION: No acute cardiopulmonary process. Electronically Signed   By: Suzy Bouchard M.D.   On: 04/05/2022 13:14   NM HEPATOBILIARY LEAK (POST-SURGICAL)  Result Date: 03/30/2022 CLINICAL DATA:  Postop cholecystectomy.  Abdominal pain and sepsis. EXAM: NUCLEAR MEDICINE HEPATOBILIARY IMAGING TECHNIQUE: Sequential images of the abdomen were obtained out to 60 minutes  following intravenous administration of radiopharmaceutical. RADIOPHARMACEUTICALS:  5.3 mCi Tc-9m Choletec IV COMPARISON:  CT AP 03/30/2022 FINDINGS: Dynamic imaging was performed for 1 hour. Patient was unable to tolerate an additional hour of imaging. During the first hour there is prompt uptake and biliary excretion of activity by the liver is seen. Biliary activity passes into small bowel, consistent with patent common bile duct. No signs of radiotracer extravasation or abnormal collection of radiotracer activity to suggest bile leak. IMPRESSION: 1. No abnormal extravasation of the radiopharmaceutical to suggest active bile leak. 2. Patent common bile duct with normal biliary to bowel activity. Electronically Signed   By: TKerby MoorsM.D.   On: 03/30/2022 16:59   CT CHEST ABDOMEN PELVIS WO CONTRAST  Result Date: 03/30/2022 CLINICAL DATA:  Sepsis. Renal cell carcinoma. Lumbar metastasis. * Tracking Code: BO * EXAM: CT CHEST, ABDOMEN AND PELVIS WITHOUT CONTRAST TECHNIQUE: Multidetector CT imaging of the chest, abdomen and pelvis was performed following the standard protocol without IV contrast. RADIATION DOSE REDUCTION: This exam was performed according to the departmental dose-optimization program which includes automated exposure control, adjustment of the mA and/or kV according to patient size and/or use of iterative reconstruction technique. COMPARISON:  None Available. FINDINGS: CT CHEST FINDINGS Cardiovascular: Coronary artery calcification and aortic atherosclerotic calcification. Port in the anterior chest wall with tip in distal SVC. Mediastinum/Nodes: No axillary or supraclavicular adenopathy. No mediastinal or hilar adenopathy. No pericardial fluid. Esophagus normal. Lungs/Pleura: New nodule in the medial RIGHT lung apex measuring 8 mm (49/8) new nodule in the inferior LEFT lower lobe measuring 5 mm (is 112/8) New nodule along the LEFT oblique  fissure medially measures 5 mm (60/8)  Musculoskeletal: Again demonstrated expansile lytic lesion in the medial RIGHT second rib (image 35/8. Similar pattern in the medial eleventh rib. Metastatic pattern within the first vertebral body additionally. No new lesions are present. CT ABDOMEN AND PELVIS FINDINGS Hepatobiliary: No focal lesion on noncontrast exam. Postcholecystectomy. There is small a collection the gallbladder fossa (image 63/2). This collection measures 21 mm x 18 mm. Pancreas: Pancreas is normal. Spleen: Normal spleen Adrenals/urinary tract: Adrenal glands and kidneys are normal. The ureters and bladder normal. Stomach/Bowel: Stomach, small bowel, appendix, and cecum are normal. The colon and rectosigmoid colon are normal. Vascular/Lymphatic: Abdominal aorta is normal caliber. There is no retroperitoneal or periportal lymphadenopathy. No pelvic lymphadenopathy. Reproductive: Other: No free fluid. Musculoskeletal: The metastatic lesion in the LEFT acetabulum appears increased in volume measuring 4.8 x 3.3 cm compared to 3.42.5 cm. This lesion is lytic with soft tissue expansion in the operator space. Stable L4 lumbar metastasis. IMPRESSION: CHEST IMPRESSION: 1. 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 2. No pulmonary infection 3. Stable skeletal metastasis. PELVIS IMPRESSION: 1. Small gas collection in the gallbladder fossa following cholecystectomy. Potential abscess formation. 2. Expansion of LEFT acetabular metastasis into the operator fossa. 3.  Aortic Atherosclerosis (ICD10-I70.0). Electronically Signed   By: Suzy Bouchard M.D.   On: 03/30/2022 12:01   DG Chest Portable 1 View  Result Date: 03/30/2022 CLINICAL DATA:  Shortness of breath EXAM: PORTABLE CHEST 1 VIEW COMPARISON:  01/08/2022 FINDINGS: Right chest port with catheter tip at the superior cavoatrial junction. Somewhat low lung volumes. Cardiac and mediastinal contours are within normal limits. No focal pulmonary opacity. No pleural effusion or  pneumothorax. No acute osseous abnormality. IMPRESSION: No acute cardiopulmonary process. Electronically Signed   By: Merilyn Baba M.D.   On: 03/30/2022 11:49   MR Brain W Wo Contrast  Result Date: 03/28/2022 CLINICAL DATA:  Brain/CNS neoplasm, assess treatment response. History of renal cell carcinoma. EXAM: MRI HEAD WITHOUT AND WITH CONTRAST TECHNIQUE: Multiplanar, multiecho pulse sequences of the brain and surrounding structures were obtained without and with intravenous contrast. CONTRAST:  10 mL Vueway Contrast was administered via a port which was accessed by a Equities trader. COMPARISON:  Head CT 01/08/2022 and MRI 01/02/2022 FINDINGS: Brain: New lesions: None. Larger lesions: 1. Minimally increased size of a 3 mm enhancing lesion superiorly in the right cerebellar hemisphere (series 14, image 48, previously 2 mm). 2. Increased size of the heterogeneously enhancing mass in the parasagittal left frontal lobe measuring 3.0 x 2.2 cm (series 17, image 23 and series 14, image 130), including enlargement of the more solidly enhancing anterior component noted on the prior study as well as new adjacent gyriform enhancement extending posteriorly and laterally. Mild worsening of moderate edema in the left frontal white matter without significant mass effect. Stable or smaller lesions: 1. 9 mm lesion laterally in the right occipital lobe, unchanged (series 14, image 69). 2. 3 mm right parietal cortical lesion, unchanged (series 14, image 101). 3. 4 mm right parietal cortical lesion (new on the prior MRI), unchanged (series 14, image 111). 4. 3 mm cortical lesion in the right superior frontal gyrus, unchanged (series 14, image 141). 5. 10 mm lesion laterally in the left occipital lobe, unchanged (series 14, image 52). 6. Irregular 8 mm left parietal lesion, stable to slightly smaller (series 14, image 105). 7. 5 mm left parietal lesion, unchanged (series 14, image 112). 8. 10 mm lesion posteriorly in the left  superior  frontal gyrus, unchanged (series 14, image 149). 9. Two small foci of enhancement measuring up to 3 mm in the inferior left temporal lobe, not significantly changed (series 16, image 30). Other brain findings: There is unchanged mild edema or gliosis in the left temporal and lateral left occipital lobes. There are chronic blood products associated with multiple treated metastases. No acute infarct, midline shift, or extra-axial fluid collection is evident. There is a background of mild chronic small vessel ischemic disease in the cerebral white matter. 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 A chronic lacunar infarct at the posterior aspect of the right putamen/external capsule is also unchanged. There is mild cerebral atrophy. Vascular: Major intracranial vascular flow voids are preserved. Skull and upper cervical spine: No suspicious marrow lesion. Sinuses/Orbits: Unremarkable orbits. Trace mucosal thickening in the ethmoid sinuses. Clear mastoid air cells. Other: None. IMPRESSION: 1. Increased size of a 3 cm left frontal lobe mass. While new gyriform enhancement in this region may be treatment related, the more solidly enhancing anterior portion of the mass has also enlarged which could reflect a component of tumor progression. 2. Minimally increased size of a 3 mm right cerebellar lesion. 3. Unchanged or slightly decreased size of other metastases. 4. No evidence of new intracranial metastases. Electronically Signed   By: Logan Bores M.D.   On: 03/28/2022 11:11   Labs:   Basic Metabolic Panel: Recent Labs  Lab 04/05/22 0323 04/06/22 0438 04/07/22 0353 04/08/22 0249 04/08/22 2231 04/09/22 1101 04/10/22 1849 04/11/22 0409  NA 138 140 136 137  --  134* 132* 134*  K 3.7 3.3* 4.5 4.5  --  4.1 4.3 4.1  CL 106 109 107 102  --  98 97* 97*  CO2 18* 22 23 25  $ --  27 25  27  $ GLUCOSE 167* 130* 232* 183*  --  157* 291* 158*  BUN 42* 17 16 15  $ --  14 14 14  $ CREATININE 1.76* 0.69 0.46* 0.45*  --  0.52* 0.62 0.51*  CALCIUM 7.4* 7.4* 7.5* 7.7*  --  7.7* 8.1* 8.5*  MG 1.9  --  2.2  --   --   --   --   --   PHOS  --  1.5* 2.2* 1.7* 2.8  --   --   --      CBC: Recent Labs  Lab 04/06/22 0438 04/07/22 0353 04/08/22 0249 04/09/22 1101 04/10/22 0341 04/11/22 0409  WBC 11.9* 16.0* 16.4* 12.5*  --  15.7*  HGB 7.9* 7.6* 7.6* 7.4* 7.9* 8.3*  HCT 26.4* 25.6* 25.4* 24.8* 26.2* 27.5*  MCV 86.6 85.6 85.2 84.4  --  83.1  PLT 229 225 245 245  --  365         SIGNED:   Debbe Odea, MD  [facility] 04/11/2022, 3:19 PM

## 2022-04-11 NOTE — TOC Progression Note (Signed)
Transition of Care [facility]) - Progression Note    Patient Details  Name: Kenzi Zambo MRN: [id number] Date of Birth: Feb 20, 1946  Transition of Care [facility] CM/SW [place], LCSW Phone Number: 04/11/2022, 4:23 PM  Clinical Narrative:    CSW spoke with pt's wife, she reported she was not informed pt was stable enough for discharge. CSW spoke with pt earlier in the day to inform her [facility] was denied by insurance, pt was stable for d/c and she can appeal the decision. Pt's wife stated she wanted to speak with pt's PCP in the morning then she would call her insurance company to appeal. CSW suggested pt wife to request an expedited appeal now, due to pt being up for discharge. Pt wife verbalized understanding and then became upset stating "I was under the assumption he was to be discharged to the facility" and" You are not helping me." pt's wife was told several times insurance authorization needed to be done. Pt's wife stated, "I'm his caretaker and I need him to be able to transfer and walk." CSW informed pt's wife pt is arranged with HHPT with [facility]. pt's wife stated, "That is not enough, they only came out twice a week." CSW inquired if she spoke with the facility about paying OOP. Pt's wife reported the facility stated it would be 9,000 a month. CSW attempted to discuss the plan if denial is upheld.  Pt do not have LTC insurance. CSW discussed LTC options and Private Duty Care. CSW received permission to send a referral out to [facility]. TOC to follow.   4:50pm Pt called CSW back to inquire about pt's Medicare ID. CSW provided ID number, pt's wife stated she is working on the appeal paperwork now. TOC to follow.     Expected Discharge Plan: [place] Barriers to Discharge: Continued Medical Work up  Expected Discharge Plan and [place] arrangements for the past 2 months: Single Family Home Expected Discharge Date: 04/11/22                                      Social Determinants of Health (SDOH) Interventions [place]: No Food Insecurity (03/30/2022)  Housing: Low Risk  (03/30/2022)  Transportation Needs: No Transportation Needs (03/30/2022)  Utilities: Not At Risk (03/30/2022)  Tobacco Use: Low Risk  (03/30/2022)    Readmission Risk Interventions    01/09/2022    1:14 PM  Readmission Risk Prevention Plan  Transportation Screening Complete  PCP or Specialist Appt within 3-5 Days Complete  Social Work Consult for [place] Planning/Counseling Complete  Palliative Care Screening Complete  Medication Review [employer]) Complete

## 2022-04-11 NOTE — Progress Notes (Incomplete)
[facility] Progress Note  Patient: Phillip [name]     [id number]  DOA: 03/30/2022   PCP: Charlane Ferretti, MD       Brief hospital course: ***  Subjective:  *** Assessment and Plan: Principal Problem:   Sepsis ([place]) Active Problems:   Renal cell carcinoma ([place])   HTN (hypertension)   Seizure disorder ([place])   Hypercalcemia   Normocytic anemia     Code Status: DNR Consultants: *** Level of Care: Level of care: Progressive Total time on patient care: *** DVT prophylaxis:  ***   Objective:   Vitals:   04/10/22 0355 04/10/22 1351 04/10/22 1943 04/11/22 0422  BP: 126/64 128/74 (!) 140/65 135/63  Pulse: 76 90 89 80  Resp:  18 19 19  $ Temp: 98.6 F (37 C) 98.2 F (36.8 C) 98 F (36.7 C) 97.8 F (36.6 C)  TempSrc: Oral Oral Oral Oral  SpO2: 98% 96% 97%   Weight:      Height:       Filed Weights   03/30/22 1029  Weight: 102.1 kg   Exam: ***    CBC: Recent Labs  Lab 04/06/22 0438 04/07/22 0353 04/08/22 0249 04/09/22 1101 04/10/22 0341 04/11/22 0409  WBC 11.9* 16.0* 16.4* 12.5*  --  15.7*  HGB 7.9* 7.6* 7.6* 7.4* 7.9* 8.3*  HCT 26.4* 25.6* 25.4* 24.8* 26.2* 27.5*  MCV 86.6 85.6 85.2 84.4  --  83.1  PLT 229 225 245 245  --  [id number]   Basic Metabolic Panel: Recent Labs  Lab 04/05/22 0323 04/06/22 0438 04/07/22 0353 04/08/22 0249 04/08/22 2231 04/09/22 1101 04/10/22 1849 04/11/22 0409  NA 138 140 136 137  --  134* 132* 134*  K 3.7 3.3* 4.5 4.5  --  4.1 4.3 4.1  CL 106 109 107 102  --  98 97* 97*  CO2 18* 22 23 25  $ --  27 25 27  $ GLUCOSE 167* 130* 232* 183*  --  157* 291* 158*  BUN 42* 17 16 15  $ --  14 14 14  $ CREATININE 1.76* 0.69 0.46* 0.45*  --  0.52* 0.62 0.51*  CALCIUM 7.4* 7.4* 7.5* 7.7*  --  7.7* 8.1* 8.5*  MG 1.9  --  2.2  --   --   --   --   --   PHOS  --  1.5* 2.2* 1.7* 2.8  --   --   --    GFR: Estimated Creatinine Clearance: 94.1 mL/min (A) (by C-G formula based on SCr of 0.51 mg/dL (L)).  Scheduled Meds:  apixaban  5  mg Oral BID   Chlorhexidine Gluconate Cloth  6 each Topical Daily   dexamethasone  4 mg Oral Daily   ferrous sulfate  325 mg Oral Q breakfast   levETIRAcetam  250 mg Oral BID   mouth rinse  15 mL Mouth Rinse 4 times per day   oxyCODONE  10-20 mg Oral Daily   oxyCODONE  20 mg Oral QHS   testosterone  5 g Transdermal Daily   Continuous Infusions: Imaging and lab data was personally reviewed No results found.  LOS: 12 days   Author: Debbe Odea  04/11/2022 10:04 AM  To contact [facility]>   Check the care team in [facility] and look for the attending/consulting [place] provider listed  Log into [email or web address] and use [place]'s universal password   Go to> "[facility]"  and find provider  If you still have difficulty reaching the provider, please page the  DOC (Director on Call) for the Hospitalists listed on amion

## 2022-04-11 NOTE — Plan of Care (Signed)
  Problem: Education: Goal: Knowledge of General Education information will improve Description Including pain rating scale, medication(s)/side effects and non-pharmacologic comfort measures Outcome: Progressing   Problem: Health Behavior/Discharge Planning: Goal: Ability to manage health-related needs will improve Outcome: Progressing   

## 2022-04-12 ENCOUNTER — Other Ambulatory Visit (HOSPITAL_COMMUNITY): Payer: Self-pay

## 2022-04-12 ENCOUNTER — Encounter: Payer: Self-pay | Admitting: *Deleted

## 2022-04-12 ENCOUNTER — Telehealth: Payer: Self-pay

## 2022-04-12 DIAGNOSIS — R6510 Systemic inflammatory response syndrome (SIRS) of non-infectious origin without acute organ dysfunction: Secondary | ICD-10-CM | POA: Diagnosis not present

## 2022-04-12 NOTE — Plan of Care (Signed)
  Problem: Education: Goal: Knowledge of General Education information will improve Description Including pain rating scale, medication(s)/side effects and non-pharmacologic comfort measures Outcome: Progressing   Problem: Health Behavior/Discharge Planning: Goal: Ability to manage health-related needs will improve Outcome: Progressing   

## 2022-04-12 NOTE — Progress Notes (Signed)
Being D/C to SNF today per Dr. [name]. Needs to be seen in 2 weeks (no lab). Scheduling order sent.

## 2022-04-12 NOTE — Progress Notes (Signed)
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  In ED RR in 20s, temp 101.9. WBC 13.8. CT C/A/P> small gas collection in the gallbladder fossa following cholecystectomy. Potential abscess formation.Expansion of LEFT acetabular metastasis into the operator fossa.  Initial workup for possible sepsis secondary to biliary infection was inconclusive with negative HIDA scan. Consultations with General Surgery and Interventional Radiology suggested postsurgical changes rather than abscess formation. The patient has shown improvement in white blood cell count with empiric treatment using cefepime and Flagyl. Hospital course was complicated by acute confusion, urinary retention and an acute left LE DVT.   Subjective:  He has no complains.   Assessment and Plan:   SIRS- Sepsis ruled out, leukocytosis: Despite concerns for biliary infection and possible abscess formation, no definitive source identified. Completed 10-day course of IV antibiotics (cefepime, Flagyl, Augmentin) with stable leukocyte count and resolution of fever. Negative blood, urine, and viral cultures. CT abdomen pelvis negative for abscess. Patient afebrile, stable. Persistent leukocytosis with WBC ~15 likely due to underlying cancer  Acute metabolic encephalopathy: Confusion likely multifactorial> brain metastasis, SIRS Improved with IV antibiotics and steroids.   Urinary retention, gross hematuria, AKI: Catheter placement resulted in gross hematuria but it improved renal function.  Cr 0.7 > 1.27 > > 0.51 Failed voiding trial and will discharge with Foley  catheter.   Acute DVT Left LE,  > left common femoral vein, SF junction, left femoral vein, left proximal profunda vein, left popliteal vein, left posterior tibial veins, left peroneal veins, and left gastrocnemius vein  Initiated Eliquis 5 mg BID after discussion with oncology, Dr Ammie Dalton - TED hose ordered. Edema improved.   Scrotal edema Suspected scrotal swelling related to volume overload. 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 On Decadron for brain metastases. Continue Keppra for seizure control. Palliative care recommended by oncology    Normocytic anemia: Stable- Hgb ~ 7-9   Hypokalemia: Replaced potassium.      Hypophosphatemia: Replaced phosphorus.      Estimated BMI is 31.38 kg/m, indicating obesity. Continue multidisciplinary approach to address complex medical issues and provide comprehensive supportive care. Regular reassessment and adjustment of treatment plans as necessary.       Code Status: DNR Consultants: oncology Level of Care: Level of care: Progressive Total time on patient care: 35 DVT prophylaxis:  Place TED hose Start: 04/06/22 1453 SCDs Start: 03/30/22 1624 apixaban (ELIQUIS) tablet 5 mg     Objective:   Vitals:   04/11/22 1648 04/11/22 2122 04/12/22 0542 04/12/22 1344  BP: (!) 146/67 (!) 141/80 (!) 148/76 (!) 122/56  Pulse: 69 73 87 86  Resp: 18 20 20 18  $ Temp: 97.6 F (36.4 C) 98.2 F (36.8 C) 98.3 F (36.8 C) 97.7 F (36.5 C)  TempSrc: Oral  Oral Oral  SpO2: 98% 98% 98% 96%  Weight:      Height:       Filed Weights   03/30/22 1029  Weight: 102.1 kg   Exam: General exam: Appears comfortable  HEENT: oral mucosa moist Respiratory  system: Clear to auscultation.  Cardiovascular system: S1 & S2 heard  Gastrointestinal system: Abdomen soft, non-tender, nondistended. Normal bowel sounds   Extremities: No cyanosis, clubbing  or edema Psychiatry:  Mood & affect appropriate.   CBC: Recent Labs  Lab 04/06/22 0438 04/07/22 0353 04/08/22 0249 04/09/22 1101 04/10/22 0341 04/11/22 0409  WBC 11.9* 16.0* 16.4* 12.5*  --  15.7*  HGB 7.9* 7.6* 7.6* 7.4* 7.9* 8.3*  HCT 26.4* 25.6* 25.4* 24.8* 26.2* 27.5*  MCV 86.6 85.6 85.2 84.4  --  83.1  PLT 229 225 245 245  --  99991111   Basic Metabolic Panel: Recent Labs  Lab 04/06/22 0438 04/07/22 0353 04/08/22 0249 04/08/22 2231 04/09/22 1101 04/10/22 1849 04/11/22 0409  NA 140 136 137  --  134* 132* 134*  K 3.3* 4.5 4.5  --  4.1 4.3 4.1  CL 109 107 102  --  98 97* 97*  CO2 22 23 25  $ --  27 25 27  $ GLUCOSE 130* 232* 183*  --  157* 291* 158*  BUN 17 16 15  $ --  14 14 14  $ CREATININE 0.69 0.46* 0.45*  --  0.52* 0.62 0.51*  CALCIUM 7.4* 7.5* 7.7*  --  7.7* 8.1* 8.5*  MG  --  2.2  --   --   --   --   --   PHOS 1.5* 2.2* 1.7* 2.8  --   --   --    GFR: Estimated Creatinine Clearance: 94.1 mL/min (A) (by C-G formula based on SCr of 0.51 mg/dL (L)).  Scheduled Meds:  apixaban  5 mg Oral BID   Chlorhexidine Gluconate Cloth  6 each Topical Daily   dexamethasone  4 mg Oral Daily   ferrous sulfate  325 mg Oral Q breakfast   levETIRAcetam  250 mg Oral BID   mouth rinse  15 mL Mouth Rinse 4 times per day   oxyCODONE  10-20 mg Oral Daily   oxyCODONE  20 mg Oral QHS   testosterone  5 g Transdermal Daily   Continuous Infusions: Imaging and lab data was personally reviewed No results found.  LOS: 13 days   Author: Debbe Odea  04/12/2022 2:50 PM  To contact [facility]>   Check the care team in [facility] and look for the attending/consulting [facility] provider listed  Log into [email or web address] and use [place]'s universal password   Go to> "[facility]"  and find provider  If you still have difficulty reaching the provider, please page the [facility] (Director on Call) for the Hospitalists listed on amion

## 2022-04-12 NOTE — Progress Notes (Signed)
IP PROGRESS NOTE  Subjective:   [name] is alert.  His wife is at the bedside.  No complaint.   Objective: Vital signs in last 24 hours: Blood pressure (!) 148/76, pulse 87, temperature 98.3 F (36.8 C), temperature source Oral, resp. rate 20, height 5' 11"$  (1.803 m), weight 225 lb (102.1 kg), SpO2 98 %.  Intake/Output from previous day: 02/14 0701 - 02/15 0700 In: 360 [P.O.:360] Out: 2400 [Urine:2400]  Physical Exam:  HEENT: No thrush Lungs: Clear anteriorly, no respiratory distress Cardiac: Regular rate and rhythm Abdomen: Soft and nontender GU: Decreased scrotal edema Extremities: Mild edema throughout the left leg Neurologic: Alert, moves extremities to commands, speaks in sentences skin: Minimal erythema of the anterior chest  Portacath/PICC-without erythema  Lab Results: Recent Labs    04/09/22 1101 04/10/22 0341 04/11/22 0409  WBC 12.5*  --  15.7*  HGB 7.4* 7.9* 8.3*  HCT 24.8* 26.2* 27.5*  PLT 245  --  365    BMET Recent Labs    04/10/22 1849 04/11/22 0409  NA 132* 134*  K 4.3 4.1  CL 97* 97*  CO2 25 27  GLUCOSE 291* 158*  BUN 14 14  CREATININE 0.62 0.51*  CALCIUM 8.1* 8.5*    Lab Results  Component Value Date   CEA1 1.77 01/04/2016    Studies/Results: No results found.  Medications: I have reviewed the patient's current medications.  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, L4 nerve compression Biopsy of the L4 mass 11/18/2015 confirmed metastatic renal cell carcinoma, clear cell type CTs of the chest, abdomen, and pelvis 11/18/2015-right lower lobe nodule, expansile lytic lesion at the right 11th rib/costal vertebral junction, left renal mass, expansile lesion involving the L4 vertebra, lytic lesion at the left acetabulum, and a low-attenuation liver lesion Initiation of SRS to L4 12/02/2015, Completed 12/12/2015 Initiation of Pazopanib 12/30/2015 Pazopanib placed on hold 02/06/2016  secondary to elevated liver enzymes Pazopanib resumed 03/07/2016 at a dose of 400 mg daily  Pazopanib discontinued 03/19/2016 secondary to elevated liver enzymes Restaging CTs 04/02/2016-stable left renal mass, decreased soft tissue component associated with the L4 metastasis, increased soft tissue component associated with the right 11th rib metastasis with increased T11 bony destruction, increased sclerosis at the left acetabulum lesion Cycle 1 nivolumab 04/12/2016 Cycle 2 nivolumab 04/26/2016 Cycle 3 nivolumab 05/11/2016 Cycle 4 nivolumab 05/24/2016 Cycle 5 nivolumab 06/08/2016 MRI lumbar spine 06/21/2016-unchanged tumor at L3, increased size of retroperitoneal lymph nodes compared to a CT from 04/02/2016 Cycle 6 nivolumab  06/22/2016 CTs chest, abdomen, and pelvis 07/04/2016-enlargement of the left renal mass, right adrenal nodule, left hilar and peritoneal lymph nodes, enlargement of left acetabular lesion. Stable lung nodules. Cycle 7 nivolumab 07/06/2016 Cycle 8 nivolumab 07/20/2016 Cycle 9 nivolumab 08/02/2016 Cycle 10 nivolumab 08/20/2016 Restaging CT 09/03/2016 evaluation with stable disease Cycle 11 nivolumab 09/05/2016 Cycle 12 nivolumab 09/19/2016 Cycle 13 nivolumab 10/03/2016 Cycle 14 nivolumab 10/17/2016 Cycle 15 nivolumab 10/31/2016 Cycle 16 nivolumab 11/14/2016 (changed to monthly schedule) Cycle 17 nivolumab 12/19/2016 CTs 01/21/2017-increased left renal mass, increased size of adrenal metastases, increased lytic bone lesions, increased left lung nodule, persistent tumor at L4 with probable epidural component Initiation of Cabozantinib 01/28/2017 Restaging CTs 05/30/2017- decreased size of left hilar mass, left renal mass, retroperitoneal adenopathy, and adrenal metastasis.  Healing bone lesions. Cabozantinib continued CTs 10/21/2017- interval enlargement left hilar lymph node; stable rib lesions; stable mass left renal cortex; stable mildly nodular adrenal glands; stable  lytic lesions within the pelvis and spine.  Cabozantinib continued CTs 02/21/2018- enlargement of an AP window lymph node.  Mildly enlarged left hilar lymph node is unchanged.  Primary renal cell carcinoma involving the upper pole of the left kidney appears similar.  Stable enlarged right periaortic lymph node adjacent to the renal vessels.  Stable multifocal bony metastatic disease. Cabozantinib continued CTs 07/29/2018- stable 15 mm AP window nodes; stable left hilar node; subcarinal node slightly larger; right periaortic node 16 mm, previously 12 mm; portacaval node 24 mm, previously 16 mm; 15 mm node superior to the pancreatic head has enlarged; interval increase nodularity of both adrenal glands; stable left kidney mass; multifocal bony metastatic disease not significantly changed. Cabozantinib continued CTs 11/06/2018- moderate improvement in thoracic adenopathy; minimal improvement abdominal adenopathy; left kidney upper pole mass and various lytic expansile bone lesions stable; mild increase in nodularity of left adrenal gland; right adrenal gland nodularity stable. 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  Peripheral enhancing masses consistent with metastases. No hemorrhage. MRI brain 03/11/2019-7 enhancing brain masses consistent with metastatic disease SRS to 7 brain lesions, treatment given 03/19/2019, 03/23/2019, and 03/25/2019 CTs 04/09/2019-decrease in left renal mass, bilateral adrenal nodules, AP window and porta hepatic adenopathy.  New 9 mm right lower lobe nodule.  Improved lytic lesion of the left acetabulum.  Other bone lesions are stable. Cabozantinib continued MRI brain 07/17/2019-resolution of 4 mm treated lesion in the right frontal cortex, 6 remaining treated lesions have decreased in size, new punctate metastasis in the superior right cerebellum SRS to right cerebellar  lesion 07/30/2019 CTs 08/17/2019-previously noted right lower lobe nodule resolved, stable left kidney mass, mixed lytic/sclerotic bone lesions in the thoracolumbar spine, right posterior ribs, and left acetabulum-unchanged, no evidence of progressive disease  Cabozantinib continued MRI brain 10/23/2019-stable to slight decrease in size of multiple enhancing intracranial lesions.  Slight increase in surrounding edema in the medial left frontal lobe.  No new lesions present. MRI brain 01/29/2020-multiple enhancing brain lesions, some hemorrhagic, some with mild enlargement-treatment effect? CTs 02/08/2020-no thoracic metastases, enlargement of the left renal mass, enlargement of lytic lesion at T9, other lytic lesions unchanged MRI brain 05/04/2020-no new lesions, slight enlargement of a right occipital lesion, other lesions are stable or decreased in size CTs 08/11/2020-enlargement of left kidney mass, new 1.6 cm segment 7 liver lesion, enlargement of a lytic lesion at T9, increased lysis of a sclerotic lesion at the right second rib, 0.7 cm endoluminal nodule at the bladder dome-enlarged Brain MRI 08/10/2020-slight increase in a 15 mm left frontal lobe lesion, new punctate focus in the right occipital cortex, other lesions stable or slightly decreased CTs 11/01/2020-increase in left suprahilar opacity, enlargement of previous liver metastases, several new subcentimeter lesions, increase in left renal mass, similar appearance of bone metastases, stable hyperdense/enhancing nodule in the left bladder Brain MRI 11/04/2020-no change in brain metastases Lenvatinib/pembrolizumab 11/30/2020 CTs 03/03/2021-stable left hilar lymph nodes; resolution of left upper lobe perihilar nodularity; unchanged for millimeter peripheral right upper lobe nodule; multiple lytic bone lesions appear stable; left kidney lesion decreased in size; bilateral adrenal nodules decreased in size; several liver lesion showed mild increase in size;  several new small lesions within the right hepatic lobe. Lenvatinib/pembrolizumab continued 03/07/2021 Brain MRI 03/10/2021-progression of a left frontal metastasis, tiny new metastasis in the right frontal lobe-referred for SBRT to the right frontal lesion and to [facility] to consider a LITT procedure for the progressive left frontal lesion SRS 04/06/2021, LITT  procedure at [facility] 04/18/2021-pathology metastatic renal cell carcinoma in background of necrosis/gliosis Lenvatinib on hold Pembrolizumab held 04/24/2021 Pembrolizumab 05/01/2021, lenvatinib remains on hold pending surgical evaluation Pembrolizumab 05/25/2021, lenvatinib resumed Pembrolizumab 06/15/2021 Lenvima resumed 06/21/2021 CTs 07/28/2021-continued regression left renal lesion.  Much improved appearance of the liver.  Continued regression of bilateral adrenal gland nodules.  Stable mixed lytic and sclerotic metastatic bone disease.  No new or progressive findings. Pembrolizumab 07/31/2021, lenvatinib continued Pembrolizumab held 08/21/2021 due to generalized weakness Pembrolizumab resumed 09/11/2021 MRI brain 10/19/2021-"new "focus of minimal enhancement of the right frontal lobe, larger punctate right cerebellar lesion, review of MRI found the right frontal lesion was present in 2021 and was treated with SRS CTs 11/12/2021-2 new right upper lung nodules, stable left renal lesion, enlarging lytic lesion in the left fourth rib Pembrolizumab and lenvatinib continued Brain 01/02/2022-new 4 mm lesion in the right parietal cortex the largest lesion in left frontal lobe stable in size with more solid  CTs chest, abdomen, and pelvis 01/08/2022-increased soft tissue component of a lytic left acetabular  metastasis and increased sclerotic component of the left fourth rib metastasis, decreased conspicuity of right upper lobe nodules, no change in renal mass Pembrolizumab and lenvatinib continued MRI brain 03/27/2022-minimal increase size of a 3 mm right  cerebellar lesion, increased size of enhancing mass at the left frontal lobe measuring 3 x 2.2 cm with adjacent gyriform enhancement and mild worsening of edema in the left frontal white matter CTs 03/30/2022-new pulmonary nodules, small gas collection in the gallbladder fossa, increased left acetabular metastasis   Pain secondary to #1-managed by Dr. Lovenia Shuck.  Improved Hypertension Elevated transaminases 02/06/2016- Pazopanib placed on hold Liver enzymes normal 03/07/2016 Port-A-Cath placement 05/16/2016 Malaise/anorexia 09/05/2016. Cortisol and testosterone levels low. Hydrocortisone and testosterone replacement initiated. Conjunctival/scleral erythema 09/19/2016-resolved with steroid eyedrops Proximal right leg weakness. 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 Hypercalcemia status post Zometa 01/23/2017-resolved Hypercalcemia 03/29/2022-Zometa Pruritic rash following IV contrast 10/21/2017; rash following IV contrast 02/21/2018 despite prednisone/Benadryl premedication Brief episodes of expressive aphasia October and December 2020 Vitamin B12 deficiency confirmed on lab 03/07/2021-started oral vitamin B12 replacement 03/28/2021 Admission 01/08/2022 with acute cholecystitis/sepsis, cholecystostomy tube Cholecystectomy 03/15/2022 14.  Admission 03/30/2022 with failure to thrive and fever 15.  Hematuria with urinary retention-likely secondary to bleeding from the renal mass 16.  Left lower extremity swelling to 11/16/2022-Doppler confirmed extensive deep vein thrombosis, apixaban   Mr Phillip [name] has metastatic renal cell carcinoma.  There has been recent clinical and radiologic evidence of disease progression.  He was treated for hypercalcemia in early February. He was admitted with a fever and failure to thrive.  There was no clear source for infection.  The fever has resolved.  Cultures are negative.  A biliary scan was negative for a bile leak.  Hypercalcemia has  resolved.  He has persistent leukocytosis, likely related to metastatic renal cell carcinoma, and Decadron.  [name] is more alert this week.  He is participating in physical therapy.  [name] does not appear to be a candidate for further systemic therapy to treat the renal cell carcinoma, but he does appear to be making progress with physical therapy.  I feel he is a candidate for a short skilled nursing facility stay to continue physical therapy.  His wife would like to have him at home.  We will plan to make a palliative care referral when he returns home. Recommendations: Continue apixaban for the left lower extremity DVT Continue Decadron  OxyContin/oxycodone for pain Physical therapy Skilled nursing facility placement to continue with physical therapy with a plan to return home Consider trial of removing Foley catheter if the urine remains clear while on apixaban 7.   Outpatient follow-up will be scheduled at the Cancer center    LOS: 13 days   Betsy Coder, MD   04/12/2022, 9:45 AM

## 2022-04-12 NOTE — TOC Progression Note (Signed)
Transition of Care [facility]) - Progression Note    Patient Details  Name: Jerimi Write MRN: [id number] Date of Birth: 1945/06/08  [facility] Care [facility]) CM/SW [place], LCSW Phone Number: 04/12/2022, 10:27 AM  Clinical Narrative:     CSW contacted HTA, pt's appeal is in progress, will check back with [facility] daily for updates. TOC to follow.  Expected Discharge Plan: [place] Barriers to Discharge: Continued Medical Work up  Expected Discharge Plan and [place] arrangements for the past 2 months: Single Family Home Expected Discharge Date: 04/11/22                                     Social Determinants of Health (SDOH) Interventions [place]: No Food Insecurity (03/30/2022)  Housing: Low Risk  (03/30/2022)  Transportation Needs: No Transportation Needs (03/30/2022)  Utilities: Not At Risk (03/30/2022)  Tobacco Use: Low Risk  (03/30/2022)    Readmission Risk Interventions    01/09/2022    1:14 PM  Readmission Risk Prevention Plan  Transportation Screening Complete  PCP or Specialist Appt within 3-5 Days Complete  Social Work Consult for [place] Planning/Counseling Complete  Palliative Care Screening Complete  Medication Review Press photographer) Complete

## 2022-04-12 NOTE — Telephone Encounter (Signed)
[employer] ([employer]) called and left a message stated he need to speak with some one about the patient appeal. I reach out to [place] and I reply the message Rob at HTA number is [phone].

## 2022-04-13 ENCOUNTER — Other Ambulatory Visit (HOSPITAL_COMMUNITY): Payer: Self-pay

## 2022-04-13 DIAGNOSIS — R6510 Systemic inflammatory response syndrome (SIRS) of non-infectious origin without acute organ dysfunction: Secondary | ICD-10-CM | POA: Diagnosis not present

## 2022-04-13 MED ORDER — ORAL CARE MOUTH RINSE
15.0000 mL | OROMUCOSAL | Status: DC
  Administered 2022-04-14 – 2022-04-30 (×51): 15 mL via OROMUCOSAL

## 2022-04-13 MED ORDER — ORAL CARE MOUTH RINSE
15.0000 mL | OROMUCOSAL | Status: DC | PRN
  Administered 2022-04-13: 15 mL via OROMUCOSAL

## 2022-04-13 NOTE — Progress Notes (Signed)
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  In ED RR in 20s, temp 101.9. WBC 13.8. CT C/A/P> small gas collection in the gallbladder fossa following cholecystectomy. Potential abscess formation.Expansion of LEFT acetabular metastasis into the operator fossa.  Initial workup for possible sepsis secondary to biliary infection was inconclusive with negative HIDA scan. Consultations with General Surgery and Interventional Radiology suggested postsurgical changes rather than abscess formation. The patient has shown improvement in white blood cell count with empiric treatment using cefepime and Flagyl. Hospital course was complicated by acute confusion, urinary retention and an acute left LE DVT.   Subjective:  No complaints today.   Assessment and Plan:   SIRS- Sepsis ruled out, leukocytosis: Despite concerns for biliary infection and possible abscess formation, no definitive source identified. Completed 10-day course of IV antibiotics (cefepime, Flagyl, Augmentin) with stable leukocyte count and resolution of fever. Negative blood, urine, and viral cultures. CT abdomen pelvis negative for abscess. Patient afebrile, stable. Persistent leukocytosis with WBC ~15 likely due to underlying cancer  Acute metabolic encephalopathy: Confusion likely multifactorial> brain metastasis, SIRS Improved with IV antibiotics and steroids.   Urinary retention, gross hematuria, AKI: Catheter placement resulted in gross hematuria but it improved renal function.  Cr 0.7 > 1.27 > > 0.51 Failed voiding trial and will discharge with Foley  catheter.   Acute DVT Left LE,  > left common femoral vein, SF junction, left femoral vein, left proximal profunda vein, left popliteal vein, left posterior tibial veins, left peroneal veins, and left gastrocnemius vein  Initiated Eliquis 5 mg BID after discussion with oncology, Dr Ammie Dalton - TED hose ordered. Edema improved.   Scrotal edema Suspected scrotal swelling related to volume overload. 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 On Decadron for brain metastases. Continue Keppra for seizure control. Palliative care recommended by oncology    Normocytic anemia: Stable- Hgb ~ 7-9   Hypokalemia: Replaced potassium.      Hypophosphatemia: Replaced phosphorus.      Estimated BMI is 31.38 kg/m, indicating obesity. Continue multidisciplinary approach to address complex medical issues and provide comprehensive supportive care. Regular reassessment and adjustment of treatment plans as necessary.       Code Status: DNR Consultants: oncology Level of Care: Level of care: Progressive Total time on patient care: 35 DVT prophylaxis:  Place TED hose Start: 04/06/22 1453 SCDs Start: 03/30/22 1624 apixaban (ELIQUIS) tablet 5 mg     Objective:   Vitals:   04/12/22 1344 04/12/22 2031 04/13/22 0435 04/13/22 1346  BP: (!) 122/56 (!) 143/80 (!) 110/58 (!) 117/57  Pulse: 86 78 68 74  Resp: 18 20 20 18  $ Temp: 97.7 F (36.5 C) 98 F (36.7 C) 98.3 F (36.8 C) 98.3 F (36.8 C)  TempSrc: Oral Oral Oral Oral  SpO2: 96% 97% 97% 96%  Weight:      Height:       Filed Weights   03/30/22 1029  Weight: 102.1 kg   Exam: General exam: Appears comfortable  General exam: Appears comfortable  HEENT:  oral mucosa moist Respiratory system: Clear to auscultation.  Cardiovascular system: S1 & S2 heard  Gastrointestinal system: Abdomen soft, non-tender, nondistended. Normal bowel sounds    Extremities: No cyanosis, clubbing or edema Psychiatry:  Mood & affect appropriate.    CBC: Recent Labs  Lab 04/07/22 0353 04/08/22 0249 04/09/22 1101 04/10/22 0341 04/11/22 0409  WBC 16.0* 16.4* 12.5*  --  15.7*  HGB 7.6* 7.6* 7.4* 7.9* 8.3*  HCT 25.6* 25.4* 24.8* 26.2* 27.5*  MCV 85.6 85.2 84.4  --  83.1  PLT 225 245 245  --  [id number]    Basic Metabolic Panel: Recent Labs  Lab 04/07/22 0353 04/08/22 0249 04/08/22 2231 04/09/22 1101 04/10/22 1849 04/11/22 0409  NA 136 137  --  134* 132* 134*  K 4.5 4.5  --  4.1 4.3 4.1  CL 107 102  --  98 97* 97*  CO2 23 25  --  27 25 27  $ GLUCOSE 232* 183*  --  157* 291* 158*  BUN 16 15  --  14 14 14  $ CREATININE 0.46* 0.45*  --  0.52* 0.62 0.51*  CALCIUM 7.5* 7.7*  --  7.7* 8.1* 8.5*  MG 2.2  --   --   --   --   --   PHOS 2.2* 1.7* 2.8  --   --   --     GFR: Estimated Creatinine Clearance: 94.1 mL/min (A) (by C-G formula based on SCr of 0.51 mg/dL (L)).  Scheduled Meds:  apixaban  5 mg Oral BID   Chlorhexidine Gluconate Cloth  6 each Topical Daily   dexamethasone  4 mg Oral Daily   ferrous sulfate  325 mg Oral Q breakfast   levETIRAcetam  250 mg Oral BID   mouth rinse  15 mL Mouth Rinse 4 times per day   oxyCODONE  10-20 mg Oral Daily   oxyCODONE  20 mg Oral QHS   testosterone  5 g Transdermal Daily   Continuous Infusions: Imaging and lab data was personally reviewed No results found.  LOS: 14 days   Author: Debbe Odea  04/13/2022 5:28 PM  To contact [facility]>   Check the care team in [facility] and look for the attending/consulting [place] provider listed  Log into [email or web address] and use [place]'s universal password   Go to> "[facility]"  and find provider  If you still have difficulty reaching the provider, please page the [facility] (Director on Call) for the Hospitalists listed on amion

## 2022-04-13 NOTE — TOC Progression Note (Signed)
Transition of Care [facility]) - Progression Note    Patient Details  Name: Phillip [name] MRN: [id number] Date of Birth: 1945/08/03  Transition of Care [facility]) CM/SW [place], LCSW Phone Number: 04/13/2022, 10:23 AM  Clinical Narrative:     CSW spoke to [place] with [facility] she reported speaking with pt's wife regarding home care options. She reported she provided a home care agency that can start within 24, if pt's appeal is denied. [place] also stated she provided more information about LTC options as well.   CSW spoke with [name] , she reported she has not heard from HTA as of yet. TOC to follow   Expected Discharge Plan: [place] Barriers to Discharge: Continued Medical Work up  Expected Discharge Plan and [place] arrangements for the past 2 months: Single Family Home Expected Discharge Date: 04/11/22                                     Social Determinants of Health (SDOH) Interventions [place]: No Food Insecurity (03/30/2022)  Housing: Low Risk  (03/30/2022)  Transportation Needs: No Transportation Needs (03/30/2022)  Utilities: Not At Risk (03/30/2022)  Tobacco Use: Low Risk  (03/30/2022)    Readmission Risk Interventions    01/09/2022    1:14 PM  Readmission Risk Prevention Plan  Transportation Screening Complete  PCP or Specialist Appt within 3-5 Days Complete  Social Work Consult for [place] Planning/Counseling Complete  Palliative Care Screening Complete  Medication Review Press photographer) Complete

## 2022-04-13 NOTE — Progress Notes (Signed)
Physical Therapy Treatment Patient Details Name: Phillip [name] MRN: [id number] DOB: 02/04/1946 Today's Date: 04/13/2022   History of Present Illness Pt is a 77 year old male admitted for Sepsis secondary to biliary infection and with history of S4 RCC w/ liver, pulm and brain mets.  PMHx including but not limited to anemia, CKD, renal CA, HTN, seizure, cholecystitis in 11/23 with cholecystostomy tube placed - and lap cholecystectomy 03/15/22.  A brain MRI this week confirmed increase in the size of a dominant left frontal lobe metastasis with mild worsening of edema.    PT Comments    Pt awake, following commands, thankful for therapy coming to room. Pt needing mod A with HOB elevated, inches BLE towards EOB, able to use bedrail to pull trunk up and pulls on therapist's hands to fully upright trunk. Once sitting EOB, attempted STS with max A+1 but only able to clear bottom partially. Pt laterally scoots up to [facility] with mod A using bedpad from foot of bed to [facility]. Pt assisted back to supine with max A to lift BLE back into bed and reposition. Pt motivated when therapist educated him that he only needed +1 assist for bed mobility this session. Continue to progress as able.    Recommendations for follow up therapy are one component of a multi-disciplinary discharge planning process, led by the attending physician.  Recommendations may be updated based on patient status, additional functional criteria and insurance authorization.  Follow Up Recommendations  Skilled nursing-short term rehab (<3 hours/day) Can patient physically be transported by private vehicle: No   Assistance Recommended at Discharge Frequent or constant Supervision/Assistance  Patient can return home with the following Two people to help with walking and/or transfers;Two people to help with bathing/dressing/bathroom;Help with stairs or ramp for entrance;Assistance with cooking/housework;Assist for transportation;Assistance with  feeding;Direct supervision/assist for medications management   Equipment Recommendations  None recommended by PT    Recommendations for Other Services       Precautions / Restrictions Precautions Precautions: Fall Restrictions Weight Bearing Restrictions: No     Mobility  Bed Mobility Overal bed mobility: Needs Assistance Bed Mobility: Supine to Sit, Sit to Supine  Supine to sit: Mod assist, HOB elevated Sit to supine: Max assist  General bed mobility comments: max A with HOB elevated, pt pulling on bedrail, inches BLE towards EOB, therapist assisting BLE off of bed and pt pulls on therapist's hands to fully upright trunk into sitting, increased time; Pt needing max A to lift BLE back into bed and reposition trunk into center of bed, able to inch BLE back into midline with increased time and cues    Transfers Overall transfer level: Needs assistance  General transfer comment: attempted STS but only able to partially clear bottom x2 attempts with max A+; pt needing mod A with use of bedpad to lateral scoot from foot of bed up to [facility], multimodal cues for weight shift and BUE assist    Ambulation/Gait    Stairs             Wheelchair Mobility    Modified Rankin (Stroke Patients Only)       Balance Overall balance assessment: Needs assistance Sitting-balance support: No upper extremity supported, Feet supported Sitting balance-Leahy Scale: Fair             Cognition Arousal/Alertness: Awake/alert Behavior During Therapy: WFL for tasks assessed/performed Overall Cognitive Status: No family/caregiver present to determine baseline cognitive functioning  General Comments: pt awake, follows 1 step commands,  talkative and appreciative of therapy        Exercises      General Comments        Pertinent Vitals/Pain Pain Assessment Pain Assessment: No/denies pain    Home Living                          Prior Function            PT Goals  (current goals can now be found in the care plan section) Acute Rehab PT Goals Time For Goal Achievement: 04/15/22 Potential to Achieve Goals: Fair Progress towards PT goals: Progressing toward goals    Frequency    Min 2X/week      PT Plan Current plan remains appropriate    Co-evaluation              AM-PAC PT "6 Clicks" Mobility   Outcome Measure  Help needed turning from your back to your side while in a flat bed without using bedrails?: A Lot Help needed moving from lying on your back to sitting on the side of a flat bed without using bedrails?: A Lot Help needed moving to and from a bed to a chair (including a wheelchair)?: Total Help needed standing up from a chair using your arms (e.g., wheelchair or bedside chair)?: Total Help needed to walk in hospital room?: Total Help needed climbing 3-5 steps with a railing? : Total 6 Click Score: 8    End of Session Equipment Utilized During Treatment: Gait belt Activity Tolerance: Patient tolerated treatment well Patient left: in bed;with call bell/phone within reach;with bed alarm set Nurse Communication: Mobility status PT Visit Diagnosis: Muscle weakness (generalized) (M62.81)     Time: [id number] PT Time Calculation (min) (ACUTE ONLY): 35 min  Charges:  $Therapeutic Activity: 23-37 mins                      Tori Aimar Borghi PT, DPT 04/13/22, 3:24 PM

## 2022-04-13 NOTE — Progress Notes (Addendum)
Patient's wife inquired about plan for foley removal while RN rounding on patient.   Per patient's wife, patient had no issues voiding prior to admission.  RN reviewed chart -- first foley removed 2/7 at 1320, bladder scans done and one in and out cath performed prior to second foley placed 2/8 at 0800.   Per notes, says pt to discharge with foley.   Patient wife inquiring if another voiding trial could be attempted and removal of foley. MAR reviewed and RN did not see any ordered retention medications.     Sent secure chat to MD Rizwan to notify of wife's questions related to the foley.  MD Rizwan advised concerns with foley removal related to increasing creatinine/worsening rental function during time period of foley removal and urine retention. Patient has metastatic renal cell cancer. Patient also on anticoagulation and experienced severe hematuria when second foley was placed, concerns for worsening hematuria with in and out catheters and if a third foley needs to be placed.   Due to patient's status, MD advised that Flomax unlikely to help in this situation and that is why it was not ordered when patient had retention issues.  MD requested RN discuss this with patient and patient's wife.  If patient and wife still request foley removal, MD Rizwan advised will place the order.   RN discussed the above with the patient and his wife, both voiced understanding of concerns of removing foley and reason why retention meds not ordered.  They are concerned about patient going home with foley and hope that patient can void post foley removal prior to discharge but also are concerned about risks of removing the foley.   They have asked to discuss and think this over (possibly overnight) and then advise on decision on if they want to try removal of foley and accept bleeding risk, in and out caths, and possible replacement of foley if retention persistent or leave foley in place upon discharge.  MD Rizwan  informed of discussion.

## 2022-04-14 DIAGNOSIS — R6510 Systemic inflammatory response syndrome (SIRS) of non-infectious origin without acute organ dysfunction: Secondary | ICD-10-CM | POA: Diagnosis not present

## 2022-04-14 NOTE — Progress Notes (Signed)
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  In ED RR in 20s, temp 101.9. WBC 13.8. CT C/A/P> small gas collection in the gallbladder fossa following cholecystectomy. Potential abscess formation.Expansion of LEFT acetabular metastasis into the operator fossa.  Initial workup for possible sepsis secondary to biliary infection was inconclusive with negative HIDA scan. Consultations with General Surgery and Interventional Radiology suggested postsurgical changes rather than abscess formation. The patient has shown improvement in white blood cell count with empiric treatment using cefepime and Flagyl. Hospital course was complicated by acute confusion, urinary retention and an acute left LE DVT. Waiting for insurance decision regarding SNF. He has been stable to dc.   Subjective:  He has no complaints.  Assessment and Plan:   SIRS- Sepsis ruled out, leukocytosis: Despite concerns for biliary infection and possible abscess formation, no definitive source identified. Completed 10-day course of IV antibiotics (cefepime, Flagyl, Augmentin) with stable leukocyte count and resolution of fever. Negative blood, urine, and viral cultures. CT abdomen pelvis negative for abscess. Patient afebrile, stable. Persistent leukocytosis with WBC ~15 likely due to underlying cancer  Acute metabolic encephalopathy: Confusion likely multifactorial> brain metastasis, SIRS Improved with IV antibiotics and steroids.   Urinary retention, gross hematuria, AKI: Catheter placement resulted in gross hematuria but it improved renal function.  Cr 0.7 >  1.27 > > 0.51 Failed voiding trial and will discharge with Foley catheter.   Acute DVT Left LE,  > left common femoral vein, SF junction, left femoral vein, left proximal profunda vein, left popliteal vein, left posterior tibial veins, left peroneal veins, and left gastrocnemius vein  Initiated Eliquis 5 mg BID after discussion with oncology, Dr Ammie Dalton - TED hose ordered. Edema improved.   Scrotal edema Suspected scrotal swelling related to volume overload. 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 On Decadron for brain metastases. Continue Keppra for seizure control. Palliative care recommended by oncology    Normocytic anemia: Stable- Hgb ~ 7-9   Hypokalemia: Replaced potassium.      Hypophosphatemia: Replaced phosphorus.           Code Status: DNR Consultants: oncology Level of Care: Level of care: Progressive Total time on patient care: 35 DVT prophylaxis:  Place TED hose Start: 04/06/22 1453 SCDs Start: 03/30/22 1624 apixaban (ELIQUIS) tablet 5 mg     Objective:   Vitals:   04/13/22 1346 04/13/22 2136 04/14/22 0527 04/14/22 1328  BP: (!) 117/57 139/65 (!) 120/58 122/63  Pulse: 74 82 66 82  Resp: 18   18  Temp: 98.3 F (36.8 C) 97.6 F (36.4 C) 97.7 F (36.5 C) 98.6 F (37 C)  TempSrc: Oral Oral Oral Oral  SpO2: 96% 97% 96% 98%  Weight:      Height:       Filed Weights   03/30/22 1029  Weight: 102.1 kg   Exam: General exam: Appears comfortable  General exam: Appears comfortable  HEENT: oral mucosa moist Respiratory system: Clear to auscultation.  Cardiovascular system: S1 & S2 heard  Gastrointestinal system: Abdomen soft,  non-tender, nondistended. Normal bowel sounds   Extremities: No cyanosis, clubbing or edema Psychiatry:  Mood & affect appropriate.    CBC: Recent Labs  Lab 04/08/22 0249 04/09/22 1101 04/10/22 0341 04/11/22 0409  WBC  16.4* 12.5*  --  15.7*  HGB 7.6* 7.4* 7.9* 8.3*  HCT 25.4* 24.8* 26.2* 27.5*  MCV 85.2 84.4  --  83.1  PLT 245 245  --  [id number]    Basic Metabolic Panel: Recent Labs  Lab 04/08/22 0249 04/08/22 2231 04/09/22 1101 04/10/22 1849 04/11/22 0409  NA 137  --  134* 132* 134*  K 4.5  --  4.1 4.3 4.1  CL 102  --  98 97* 97*  CO2 25  --  27 25 27  $ GLUCOSE 183*  --  157* 291* 158*  BUN 15  --  14 14 14  $ CREATININE 0.45*  --  0.52* 0.62 0.51*  CALCIUM 7.7*  --  7.7* 8.1* 8.5*  PHOS 1.7* 2.8  --   --   --     GFR: Estimated Creatinine Clearance: 94.1 mL/min (A) (by C-G formula based on SCr of 0.51 mg/dL (L)).  Scheduled Meds:  apixaban  5 mg Oral BID   Chlorhexidine Gluconate Cloth  6 each Topical Daily   dexamethasone  4 mg Oral Daily   ferrous sulfate  325 mg Oral Q breakfast   levETIRAcetam  250 mg Oral BID   mouth rinse  15 mL Mouth Rinse 4 times per day   oxyCODONE  10-20 mg Oral Daily   oxyCODONE  20 mg Oral QHS   testosterone  5 g Transdermal Daily   Continuous Infusions: Imaging and lab data was personally reviewed No results found.  LOS: 15 days   Author: Debbe Odea  04/14/2022 4:51 PM  To contact [facility]>   Check the care team in [facility] and look for the attending/consulting [facility] provider listed  Log into [email or web address] and use [place]'s universal password   Go to> "[facility]"  and find provider  If you still have difficulty reaching the provider, please page the [facility] (Director on Call) for the Hospitalists listed on amion

## 2022-04-15 ENCOUNTER — Other Ambulatory Visit: Payer: Self-pay

## 2022-04-15 DIAGNOSIS — R6510 Systemic inflammatory response syndrome (SIRS) of non-infectious origin without acute organ dysfunction: Secondary | ICD-10-CM | POA: Diagnosis not present

## 2022-04-15 NOTE — TOC Progression Note (Signed)
Transition of Care [facility]) - Progression Note    Patient Details  Name: Phillip [name] MRN: [id number] Date of Birth: [birth year]  Transition of Care [facility]) CM/SW [place], [place] Phone Number: 04/15/2022, 9:17 AM  Clinical Narrative:    CSW left voicemail with [facility] to inquire about pt's appeal status/insurance auth for SNF.    Expected Discharge Plan: [employer] Barriers to Discharge: Continued Medical Work up  Expected Discharge Plan and [place] arrangements for the past 2 months: Single Family Home Expected Discharge Date: 04/11/22                                     Social Determinants of Health (SDOH) Interventions [place]: No Food Insecurity (03/30/2022)  Housing: Low Risk  (03/30/2022)  Transportation Needs: No Transportation Needs (03/30/2022)  Utilities: Not At Risk (03/30/2022)  Tobacco Use: Low Risk  (03/30/2022)    Readmission Risk Interventions    01/09/2022    1:14 PM  Readmission Risk Prevention Plan  Transportation Screening Complete  PCP or Specialist Appt within 3-5 Days Complete  Social Work Consult for [place] Planning/Counseling Complete  Palliative Care Screening Complete  Medication Review [employer]) Complete

## 2022-04-15 NOTE — Progress Notes (Signed)
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  In ED RR in 20s, temp 101.9. WBC 13.8. CT C/A/P> small gas collection in the gallbladder fossa following cholecystectomy. Potential abscess formation.Expansion of LEFT acetabular metastasis into the operator fossa.  Initial workup for possible sepsis secondary to biliary infection was inconclusive with negative HIDA scan. Consultations with General Surgery and Interventional Radiology suggested postsurgical changes rather than abscess formation. The patient has shown improvement in white blood cell count with empiric treatment using cefepime and Flagyl. Hospital course was complicated by acute confusion, urinary retention and an acute left LE DVT. Waiting for insurance decision regarding SNF. He has been stable to dc.   Subjective:  He thinks he is constipated but is having a daily BM, no bloating and no nausea.   Assessment and Plan:   SIRS- Sepsis ruled out, leukocytosis: Despite concerns for biliary infection and possible abscess formation, no definitive source identified. Completed 10-day course of IV antibiotics (cefepime, Flagyl, Augmentin) with stable leukocyte count and resolution of fever. Negative blood, urine, and viral cultures. CT abdomen pelvis negative for abscess. Patient afebrile, stable. Persistent leukocytosis with WBC ~15 likely due to underlying cancer  Acute metabolic encephalopathy: Confusion likely multifactorial> brain metastasis, SIRS Improved with IV antibiotics and steroids.   Urinary retention, gross hematuria, AKI: Catheter placement resulted in  gross hematuria but it improved renal function.  Cr 0.7 > 1.27 > > 0.51 Failed voiding trial and will discharge with Foley catheter.   Acute DVT Left LE,  > left common femoral vein, SF junction, left femoral vein, left proximal profunda vein, left popliteal vein, left posterior tibial veins, left peroneal veins, and left gastrocnemius vein  Initiated Eliquis 5 mg BID after discussion with oncology, Dr Ammie Dalton - TED hose ordered. Edema improved.   Scrotal edema Suspected scrotal swelling related to volume overload. 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 On Decadron for brain metastases. Continue Keppra for seizure control. Palliative care recommended by oncology    Normocytic anemia: Stable- Hgb ~ 7-9   Hypokalemia: Replaced potassium.      Hypophosphatemia: Replaced phosphorus.           Code Status: DNR Consultants: oncology Level of Care: Level of care: Progressive Total time on patient care: 35 DVT prophylaxis:  Place TED hose Start: 04/06/22 1453 SCDs Start: 03/30/22 1624 apixaban (ELIQUIS) tablet 5 mg     Objective:   Vitals:   04/14/22 1328 04/14/22 2013 04/15/22 0516 04/15/22 0539  BP: 122/63 126/60 (!) 119/54 (!) 117/53  Pulse: 82 86 65 62  Resp: 18 19  16  $ Temp: 98.6 F (37 C) 98.3 F (36.8 C) (!) 93 F (33.9 C) (!) 97.4 F (36.3 C)  TempSrc: Oral Oral Oral   SpO2: 98% 97%  98%  Weight:      Height:       Filed Weights   03/30/22 1029  Weight: 102.1 kg   Exam: General exam: Appears comfortable  General exam: Appears comfortable  HEENT: oral mucosa moist Respiratory system:  Clear to auscultation.  Cardiovascular system: S1 & S2 heard  Gastrointestinal system: Abdomen soft, non-tender, nondistended. Normal bowel sounds   Extremities: No cyanosis, clubbing or edema Psychiatry:  Mood & affect appropriate.    CBC: Recent Labs  Lab  04/09/22 1101 04/10/22 0341 04/11/22 0409  WBC 12.5*  --  15.7*  HGB 7.4* 7.9* 8.3*  HCT 24.8* 26.2* 27.5*  MCV 84.4  --  83.1  PLT 245  --  99991111    Basic Metabolic Panel: Recent Labs  Lab 04/08/22 2231 04/09/22 1101 04/10/22 1849 04/11/22 0409  NA  --  134* 132* 134*  K  --  4.1 4.3 4.1  CL  --  98 97* 97*  CO2  --  27 25 27  $ GLUCOSE  --  157* 291* 158*  BUN  --  14 14 14  $ CREATININE  --  0.52* 0.62 0.51*  CALCIUM  --  7.7* 8.1* 8.5*  PHOS 2.8  --   --   --     GFR: Estimated Creatinine Clearance: 94.1 mL/min (A) (by C-G formula based on SCr of 0.51 mg/dL (L)).  Scheduled Meds:  apixaban  5 mg Oral BID   Chlorhexidine Gluconate Cloth  6 each Topical Daily   dexamethasone  4 mg Oral Daily   ferrous sulfate  325 mg Oral Q breakfast   levETIRAcetam  250 mg Oral BID   mouth rinse  15 mL Mouth Rinse 4 times per day   oxyCODONE  10-20 mg Oral Daily   oxyCODONE  20 mg Oral QHS   testosterone  5 g Transdermal Daily   Continuous Infusions: Imaging and lab data was personally reviewed No results found.  LOS: 16 days   Author: Debbe Odea  04/15/2022 3:52 PM  To contact [facility]>   Check the care team in [facility] and look for the attending/consulting [place] provider listed  Log into [email or web address] and use [place]'s universal password   Go to> "[facility]"  and find provider  If you still have difficulty reaching the provider, please page the [facility] (Director on Call) for the Hospitalists listed on amion

## 2022-04-16 ENCOUNTER — Telehealth: Payer: Self-pay | Admitting: *Deleted

## 2022-04-16 ENCOUNTER — Other Ambulatory Visit (HOSPITAL_COMMUNITY): Payer: Self-pay

## 2022-04-16 DIAGNOSIS — R6510 Systemic inflammatory response syndrome (SIRS) of non-infectious origin without acute organ dysfunction: Secondary | ICD-10-CM | POA: Diagnosis not present

## 2022-04-16 NOTE — Progress Notes (Signed)
Physical Therapy Treatment Patient Details Name: Phillip [name] MRN: [id number] DOB: [birth year] Today's Date: 04/16/2022   History of Present Illness Pt is a 77 year old male admitted for Sepsis secondary to biliary infection and with history of S4 RCC w/ liver, pulm and brain mets.  PMHx including but not limited to anemia, CKD, renal CA, HTN, seizure, cholecystitis in 11/23 with cholecystostomy tube placed - and lap cholecystectomy 03/15/22.  A brain MRI this week confirmed increase in the size of a dominant left frontal lobe metastasis with mild worsening of edema.    PT Comments    Pt agreeable to mobilize with therapy. Once sitting EOB, pt reports bowel urge, powers to stand mod A+2 from elevated bed, maintains forward trunk flexion over RW lacking hip extension, able to take 2-3 steps over to [facility] with mod A+2 assisting with stepping and RW management. Pt powers to stand from RW with mod A+2, pushing from armrests of BSC, difficulty making transition to RW, able to maintain static stance with support for ~2 minutes. Pt powers to stand from [facility] pulling from braced RW with 2nd trial, able to take 2-3 steps back to EOB once nurse completed pericare. After seated rest break, pt able to pull from braced RW and take 2-3 steps to recliner, again needing mod A+2 to power to stand, support throughout transfer and manage RW. Pt fatigues with multiple transfers, needs total A with pericare and support from therapist while standing. Pt's spouse in room throughout session, therapist offered education regarding positioning and transfers. Pt is a high fall risk, has progressed with therapy but still needing significant +2 assist with transferring EOB<>BSC and EOB<>recliner; continuing to recommend SNF prior to return home with only spouse to assist pt.   Recommendations for follow up therapy are one component of a multi-disciplinary discharge planning process, led by the attending physician.  Recommendations may  be updated based on patient status, additional functional criteria and insurance authorization.  Follow Up Recommendations  Skilled nursing-short term rehab (<3 hours/day) Can patient physically be transported by private vehicle: No   Assistance Recommended at Discharge Frequent or constant Supervision/Assistance  Patient can return home with the following Two people to help with walking and/or transfers;Two people to help with bathing/dressing/bathroom;Help with stairs or ramp for entrance;Assistance with cooking/housework;Assist for transportation;Assistance with feeding;Direct supervision/assist for medications management   Equipment Recommendations  None recommended by PT    Recommendations for Other Services       Precautions / Restrictions Precautions Precautions: Fall Restrictions Weight Bearing Restrictions: No     Mobility  Bed Mobility Overal bed mobility: Needs Assistance Bed Mobility: Supine to Sit  Supine to sit: Mod assist, HOB elevated  General bed mobility comments: pt pulling on bedrails and therapist's hand to pull trunk into sitting, mod A to upright to EOB; spouse at bedside reports pt sleeps in recliner/lift chair at home    Transfers Overall transfer level: Needs assistance Equipment used: Rolling walker (2 wheels) Transfers: Sit to/from Stand, Bed to chair/wheelchair/BSC Sit to Stand: Mod assist, +2 physical assistance, From elevated surface  Step pivot transfers: Mod assist, +2 physical assistance  General transfer comment: mod A +2 to power up, pt lacking hip extension maintains trunk flexed forward on RW, needs assist to maneuver RW with step pivot transfer    Ambulation/Gait  General Gait Details: transfers only, fatigues easily, able to take 2-3 steps with step pivot transfer mod A+2, unable to leave seated surfaces   Stairs  Wheelchair Mobility    Modified Rankin (Stroke Patients Only)       Balance Overall balance  assessment: Needs assistance Sitting-balance support: No upper extremity supported, Feet supported Sitting balance-Leahy Scale: Fair  Standing balance support: Bilateral upper extremity supported, During functional activity, Reliant on assistive device for balance Standing balance-Leahy Scale: Zero Standing balance comment: modA+2 with RW     Cognition Arousal/Alertness: Awake/alert Behavior During Therapy: WFL for tasks assessed/performed Overall Cognitive Status: Impaired/Different from baseline  General Comments: pt pleasant, follows commands, needing increased time and cues occasionally        Exercises      General Comments General comments (skin integrity, edema, etc.): dyspnea 3/4, HR 109 and SpO2 97% once in recliner at EOS      Pertinent Vitals/Pain Pain Assessment Pain Assessment: No/denies pain    Home Living                          Prior Function            PT Goals (current goals can now be found in the care plan section) Acute Rehab PT Goals Time For Goal Achievement: 04/29/22 Potential to Achieve Goals: Fair Progress towards PT goals: Progressing toward goals    Frequency    Min 2X/week      PT Plan Current plan remains appropriate    Co-evaluation              AM-PAC PT "6 Clicks" Mobility   Outcome Measure  Help needed turning from your back to your side while in a flat bed without using bedrails?: A Lot Help needed moving from lying on your back to sitting on the side of a flat bed without using bedrails?: A Lot Help needed moving to and from a bed to a chair (including a wheelchair)?: Total Help needed standing up from a chair using your arms (e.g., wheelchair or bedside chair)?: Total Help needed to walk in hospital room?: Total Help needed climbing 3-5 steps with a railing? : Total 6 Click Score: 8    End of Session Equipment Utilized During Treatment: Gait belt Activity Tolerance: Patient tolerated treatment  well Patient left: in chair;with call bell/phone within reach;with family/visitor present Nurse Communication: Mobility status PT Visit Diagnosis: Muscle weakness (generalized) (M62.81)     Time: [id number] PT Time Calculation (min) (ACUTE ONLY): 37 min  Charges:  $Therapeutic Activity: 23-37 mins                      Tori Eva Vallee PT, DPT 04/16/22, 2:37 PM

## 2022-04-16 NOTE — Progress Notes (Signed)
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  In ED RR in 20s, temp 101.9. WBC 13.8. CT C/A/P> small gas collection in the gallbladder fossa following cholecystectomy. Potential abscess formation.Expansion of LEFT acetabular metastasis into the operator fossa.  Initial workup for possible sepsis secondary to biliary infection was inconclusive with negative HIDA scan. Consultations with General Surgery and Interventional Radiology suggested postsurgical changes rather than abscess formation. The patient has shown improvement in white blood cell count with empiric treatment using cefepime and Flagyl. Hospital course was complicated by acute confusion, urinary retention and an acute left LE DVT. Waiting for insurance decision regarding SNF. They are on 3rd level appeal. He has been stable to dc.   Subjective:  No complaints.    Assessment and Plan:   SIRS- Sepsis ruled out, leukocytosis: Despite concerns for biliary infection and possible abscess formation, no definitive source identified. Completed 10-day course of IV antibiotics (cefepime, Flagyl, Augmentin) with stable leukocyte count and resolution of fever. Negative blood, urine, and viral cultures. CT abdomen pelvis negative for abscess. Patient afebrile, stable. Persistent leukocytosis with WBC ~15 likely due to underlying cancer  Acute metabolic encephalopathy: Confusion likely multifactorial> brain metastasis, SIRS Improved with IV antibiotics and steroids.   Urinary retention, gross hematuria, AKI: Catheter placement resulted in gross hematuria but it improved renal  function.  Cr 0.7 > 1.27 > > 0.51 Failed voiding trial and will discharge with Foley catheter.   Acute DVT Left LE,  > left common femoral vein, SF junction, left femoral vein, left proximal profunda vein, left popliteal vein, left posterior tibial veins, left peroneal veins, and left gastrocnemius vein  Initiated Eliquis 5 mg BID after discussion with oncology, Dr Ammie Dalton - TED hose ordered. Edema improved.   Scrotal edema Suspected scrotal swelling related to volume overload. 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 On Decadron for brain metastases. Continue Keppra for seizure control. Palliative care recommended by oncology    Normocytic anemia: Stable- Hgb ~ 7-9   Hypokalemia: Replaced potassium.      Hypophosphatemia: Replaced phosphorus.           Code Status: DNR Consultants: oncology Level of Care: Level of care: Progressive Total time on patient care: 35 DVT prophylaxis:  Place TED hose Start: 04/06/22 1453 SCDs Start: 03/30/22 1624 apixaban (ELIQUIS) tablet 5 mg     Objective:   Vitals:   04/15/22 1553 04/15/22 2035 04/16/22 0500 04/16/22 1229  BP: (!) 126/56 (!) 127/56 (!) 127/57 (!) 127/58  Pulse: 70 72 69 88  Resp: 18 19 20 18  $ Temp: 98.7 F (37.1 C) 98.7 F (37.1 C) 98.8 F (37.1 C) 98.3 F (36.8 C)  TempSrc: Oral Oral Oral Oral  SpO2: 98% 99% 98% 96%  Weight:   95.2 kg   Height:       Filed Weights   03/30/22 1029 04/16/22 0500  Weight: 102.1 kg 95.2 kg   Exam: General exam: Appears comfortable  HEENT: oral mucosa moist Respiratory system: Clear to auscultation.  Cardiovascular system: S1 &  S2 heard  Gastrointestinal system: Abdomen soft, non-tender, nondistended. Normal bowel sounds   Extremities: No cyanosis, clubbing or edema Psychiatry:  Mood & affect appropriate.    CBC: Recent Labs  Lab 04/10/22 0341 04/11/22 0409  WBC   --  15.7*  HGB 7.9* 8.3*  HCT 26.2* 27.5*  MCV  --  83.1  PLT  --  99991111    Basic Metabolic Panel: Recent Labs  Lab 04/10/22 1849 04/11/22 0409  NA 132* 134*  K 4.3 4.1  CL 97* 97*  CO2 25 27  GLUCOSE 291* 158*  BUN 14 14  CREATININE 0.62 0.51*  CALCIUM 8.1* 8.5*    GFR: Estimated Creatinine Clearance: 91.1 mL/min (A) (by C-G formula based on SCr of 0.51 mg/dL (L)).  Scheduled Meds:  apixaban  5 mg Oral BID   Chlorhexidine Gluconate Cloth  6 each Topical Daily   dexamethasone  4 mg Oral Daily   ferrous sulfate  325 mg Oral Q breakfast   levETIRAcetam  250 mg Oral BID   mouth rinse  15 mL Mouth Rinse 4 times per day   oxyCODONE  10-20 mg Oral Daily   oxyCODONE  20 mg Oral QHS   testosterone  5 g Transdermal Daily   Continuous Infusions: Imaging and lab data was personally reviewed No results found.  LOS: 17 days   Author: Debbe Odea  04/16/2022 5:09 PM  To contact [facility]>   Check the care team in [facility] and look for the attending/consulting [place] provider listed  Log into [email or web address] and use [place]'s universal password   Go to> "[facility]"  and find provider  If you still have difficulty reaching the provider, please page the [facility] (Director on Call) for the Hospitalists listed on amion

## 2022-04-16 NOTE — Plan of Care (Addendum)
The patient's appeal for SNF has been denied. They are doing a 3rd level appeal now.

## 2022-04-16 NOTE — TOC Progression Note (Signed)
Transition of Care [facility]) - Progression Note    Patient Details  Name: Phillip [name] MRN: [id number] Date of Birth: 1945-05-05  Transition of Care [facility]) CM/SW [place], [place] Phone Number: 04/16/2022, 3:40 PM  Clinical Narrative:    CSW called spouse who shares she has not yet heard back from [employer] regarding pt's appeal. CSW called [employer] who share that pt's denial for SNF has been upheld. They share that they have sent pt's information and their decision to a 3rd party reviewer ([employer]) that has 72hrs from 2/17 to complete review.  CSW spoke with pt's wife who shares she would like to await for review to be completed. CSW discussed needing to plan for pt to return home. She shares she cannot afford cost of 24/7 caregivers but, is looking into arranged caregiver through [facility] for 12hrs a week. Pt will be receiving palliative care services through [place] and will need to maximize home health through [place].   Expected Discharge Plan: [place] Barriers to Discharge: Continued Medical Work up  Expected Discharge Plan and [place] arrangements for the past 2 months: Single Family Home Expected Discharge Date: 04/11/22                                     Social Determinants of Health (SDOH) Interventions [place]: No Food Insecurity (03/30/2022)  Housing: Low Risk  (03/30/2022)  Transportation Needs: No Transportation Needs (03/30/2022)  Utilities: Not At Risk (03/30/2022)  Tobacco Use: Low Risk  (03/30/2022)    Readmission Risk Interventions    01/09/2022    1:14 PM  Readmission Risk Prevention Plan  Transportation Screening Complete  PCP or Specialist Appt within 3-5 Days Complete  Social Work Consult for [place] Planning/Counseling Complete  Palliative Care Screening Complete  Medication Review Press photographer) Complete

## 2022-04-16 NOTE — Telephone Encounter (Signed)
Mrs. Penniman called to report that [employer] denied the request for SNF rehab. It has been sent to [place] for 2nd review of the appeal. She reports if that is denied, a 2nd appeal can be initiated and she wants MD aware. This office received the letter and it was forwarded to [place], [employer].

## 2022-04-16 NOTE — Progress Notes (Signed)
Speech Language Pathology Treatment: Dysphagia  Patient Details Name: Phillip [name] MRN: [id number] DOB: 19-Dec-1945 Today's Date: 04/16/2022 Time: 1130-1150 SLP Time Calculation (min) (ACUTE ONLY): 20 min  Assessment / Plan / Recommendation Clinical Impression  Patient seen by SLP for skilled treatment focused on dysphagia goals. He was awake, alert, conversational. His wife entered room and she did provide patient with some cues to eat meal. SLP observed patient with PO intake of thin liquids, dysphagia 3 (mechanical soft) solids. He did not require much setup assistance and he was able to feed himself. He did request SLP take the broccoli off his plate as he did not like the smell. Aside from requiring minimal amount of cues to redirect his attention to self-feeding, he did not exhibit any overt s/s aspiration or penetration and appears to be tolerating PO's well. His wife reported that since they stopped the chemo, his appetite has improved significantly. SLP discussed with patient and wife plan to continue with current PO diet. No further skilled SLP services warranted at this time.   HPI HPI: Pt is a 77 year old male admitted for sepsis secondary to biliary infection and with history of S4 RCC w/ liver, pulm and brain mets.  PMHx including but not limited to anemia, CKD, renal CA, HTN, seizure, cholecystitis in 11/23 with cholecystostomy tube placed - and lap cholecystectomy 03/15/22.  A brain MRI this week confirmed increase in the size of a dominant left frontal lobe metastasis with mild worsening of edema.      SLP Plan  Discharge SLP treatment due to (comment);All goals met      Recommendations for follow up therapy are one component of a multi-disciplinary discharge planning process, led by the attending physician.  Recommendations may be updated based on patient status, additional functional criteria and insurance authorization.    Recommendations  Diet recommendations: Dysphagia  3 (mechanical soft);Thin liquid Liquids provided via: Cup;Straw Medication Administration: Whole meds with puree Supervision: Intermittent supervision to cue for compensatory strategies;Full supervision/cueing for compensatory strategies Compensations: Minimize environmental distractions Postural Changes and/or Swallow Maneuvers: Upright 30-60 min after meal;Seated upright 90 degrees                Oral Care Recommendations: Oral care BID Follow Up Recommendations: No SLP follow up Assistance recommended at discharge: Set up Supervision/Assistance SLP Visit Diagnosis: Dysphagia, oral phase (R13.11) Plan: Discharge SLP treatment due to (comment);All goals met           Sonia Baller, MA, CCC-SLP Speech Therapy

## 2022-04-17 ENCOUNTER — Other Ambulatory Visit (HOSPITAL_COMMUNITY): Payer: Self-pay

## 2022-04-17 DIAGNOSIS — R6510 Systemic inflammatory response syndrome (SIRS) of non-infectious origin without acute organ dysfunction: Secondary | ICD-10-CM | POA: Diagnosis not present

## 2022-04-17 NOTE — Progress Notes (Signed)
Pt transferred to [facility] with a heavy 2 assist. Attempted to get pt back to bed with 2 assist and pt could not do it. Took 2 assist and stedy to get him back to bed.

## 2022-04-17 NOTE — Progress Notes (Signed)
At bedside for routine line care. Upon assessment, wetness noted around [facility] site and gown damp with fluids infusing. Noted HPN dislodged completely from septum. Re accessed with no blood return, and able to palpate the back of the septum when accessing. Difficult to flush. Re accessed with some resistance upon flushing, then good blood return noted.

## 2022-04-17 NOTE — Progress Notes (Signed)
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  In ED RR in 20s, temp 101.9. WBC 13.8. CT C/A/P> small gas collection in the gallbladder fossa following cholecystectomy. Potential abscess formation.Expansion of LEFT acetabular metastasis into the operator fossa.  Initial workup for possible sepsis secondary to biliary infection was inconclusive with negative HIDA scan. Consultations with General Surgery and Interventional Radiology suggested postsurgical changes rather than abscess formation. The patient has shown improvement in white blood cell count with empiric treatment using cefepime and Flagyl. Hospital course was complicated by acute confusion, urinary retention and an acute left LE DVT. Waiting for insurance decision regarding SNF. They are on 3rd level appeal. He has been stable to dc.   Subjective:  No complaints.    Assessment and Plan:   SIRS- Sepsis ruled out, leukocytosis: Despite concerns for biliary infection and possible abscess formation, no definitive source identified. Completed 10-day course of IV antibiotics (cefepime, Flagyl, Augmentin) with stable leukocyte count and resolution of fever. Negative blood, urine, and viral cultures. CT abdomen pelvis negative for abscess. Patient afebrile, stable. Persistent leukocytosis with WBC ~15 likely due to underlying cancer  Acute metabolic encephalopathy: Confusion likely multifactorial> brain metastasis, SIRS Improved with IV antibiotics and steroids.   Urinary retention, gross hematuria, AKI: Catheter placement resulted in gross hematuria but it improved renal  function.  Cr 0.7 > 1.27 > > 0.51 Failed voiding trial and will discharge with Foley catheter.   Acute DVT Left LE,  > left common femoral vein, SF junction, left femoral vein, left proximal profunda vein, left popliteal vein, left posterior tibial veins, left peroneal veins, and left gastrocnemius vein  Initiated Eliquis 5 mg BID after discussion with oncology, Dr Ammie Dalton - TED hose ordered. Edema improved.   Scrotal edema Suspected scrotal swelling related to volume overload. 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 On Decadron for brain metastases. Continue Keppra for seizure control. Palliative care recommended by oncology    Normocytic anemia: Stable- Hgb ~ 7-9   Hypokalemia: Replaced potassium.      Hypophosphatemia: Replaced phosphorus.           Code Status: DNR Consultants: oncology Level of Care: Level of care: Progressive Total time on patient care: 35 DVT prophylaxis:  Place TED hose Start: 04/06/22 1453 SCDs Start: 03/30/22 1624 apixaban (ELIQUIS) tablet 5 mg     Objective:   Vitals:   04/16/22 1229 04/16/22 2146 04/17/22 0427 04/17/22 1312  BP: (!) 127/58 134/60 132/62 137/65  Pulse: 88 70 69 94  Resp: 18 19 18 16  $ Temp: 98.3 F (36.8 C) 97.8 F (36.6 C) 98.7 F (37.1 C) 99.2 F (37.3 C)  TempSrc: Oral Oral Oral Oral  SpO2: 96% 99% 100% 98%  Weight:      Height:       Filed Weights   03/30/22 1029 04/16/22 0500  Weight: 102.1 kg 95.2 kg   Exam: General exam: Appears comfortable  HEENT: oral mucosa moist Respiratory system: Clear to auscultation.  Cardiovascular system: S1 & S2 heard  Gastrointestinal system: Abdomen soft, non-tender, nondistended. Normal bowel sounds   Extremities: No cyanosis, clubbing or edema Psychiatry:  Mood & affect appropriate.     CBC: Recent Labs  Lab 04/11/22 0409  WBC 15.7*  HGB 8.3*  HCT 27.5*   MCV 83.1  PLT 99991111    Basic Metabolic Panel: Recent Labs  Lab 04/10/22 1849 04/11/22 0409  NA 132* 134*  K 4.3 4.1  CL 97* 97*  CO2 25 27  GLUCOSE 291* 158*  BUN 14 14  CREATININE 0.62 0.51*  CALCIUM 8.1* 8.5*    GFR: Estimated Creatinine Clearance: 91.1 mL/min (A) (by C-G formula based on SCr of 0.51 mg/dL (L)).  Scheduled Meds:  apixaban  5 mg Oral BID   Chlorhexidine Gluconate Cloth  6 each Topical Daily   dexamethasone  4 mg Oral Daily   ferrous sulfate  325 mg Oral Q breakfast   levETIRAcetam  250 mg Oral BID   mouth rinse  15 mL Mouth Rinse 4 times per day   oxyCODONE  10-20 mg Oral Daily   oxyCODONE  20 mg Oral QHS   testosterone  5 g Transdermal Daily   Continuous Infusions: Imaging and lab data was personally reviewed No results found.  LOS: 18 days   Author: Debbe Odea  04/17/2022 3:05 PM  To contact [facility]>   Check the care team in [facility] and look for the attending/consulting [facility] provider listed  Log into [email or web address] and use [place]'s universal password   Go to> "[facility]"  and find provider  If you still have difficulty reaching the provider, please page the [facility] (Director on Call) for the Hospitalists listed on amion

## 2022-04-17 NOTE — TOC Progression Note (Signed)
Transition of Care [facility]) - Progression Note    Patient Details  Name: Phillip [name] MRN: [id number] Date of Birth: Dec 22, 1945  [facility]) CM/SW [place], LCSW Phone Number: 04/17/2022, 3:22 PM  Clinical Narrative:    CSW contacted Mer[place]a Elks, RN w/ pt's PCP office for update on appeal paperwork. Per Manuela Schwartz they have not received any new paperwork from [facility] regarding pt's appeal status.  CSW called [employer] and was told no information could be provided to CSW as CSW did not make the appeal. CSW was told to follow up with pt's providers office.    Expected Discharge Plan: [place] Barriers to Discharge: Continued Medical Work up  Expected Discharge Plan and [place] arrangements for the past 2 months: Single Family Home Expected Discharge Date: 04/11/22                                     Social Determinants of Health (SDOH) Interventions [place]: No Food Insecurity (03/30/2022)  Housing: Low Risk  (03/30/2022)  Transportation Needs: No Transportation Needs (03/30/2022)  Utilities: Not At Risk (03/30/2022)  Tobacco Use: Low Risk  (03/30/2022)    Readmission Risk Interventions    01/09/2022    1:14 PM  Readmission Risk Prevention Plan  Transportation Screening Complete  PCP or Specialist Appt within 3-5 Days Complete  Social Work Consult for [place] Planning/Counseling Complete  Palliative Care Screening Complete  Medication Review Press photographer) Complete

## 2022-04-18 DIAGNOSIS — R6510 Systemic inflammatory response syndrome (SIRS) of non-infectious origin without acute organ dysfunction: Secondary | ICD-10-CM | POA: Diagnosis not present

## 2022-04-18 NOTE — TOC Progression Note (Signed)
Transition of Care [facility]) - Progression Note    Patient Details  Name: [name] MRN: [id number] Date of Birth: 02-20-1946  Transition of Care [facility]) CM/SW Contact  Henrietta Dine, RN Phone Number: 04/18/2022, 2:34 PM  Clinical Narrative:    Alycia Rossetti regarding appeal; she says no faxes have been received from [facility] today; pt and wife notified; TOC will follow.    Expected Discharge Plan: [place] Barriers to Discharge: Continued Medical Work up  Expected Discharge Plan and [place] arrangements for the past 2 months: Single Family Home Expected Discharge Date: 04/11/22                                     Social Determinants of Health (SDOH) Interventions [place]: No Food Insecurity (03/30/2022)  Housing: Low Risk  (03/30/2022)  Transportation Needs: No Transportation Needs (03/30/2022)  Utilities: Not At Risk (03/30/2022)  Tobacco Use: Low Risk  (03/30/2022)    Readmission Risk Interventions    01/09/2022    1:14 PM  Readmission Risk Prevention Plan  Transportation Screening Complete  PCP or Specialist Appt within 3-5 Days Complete  Social Work Consult for [place] Planning/Counseling Complete  Palliative Care Screening Complete  Medication Review Press photographer) Complete

## 2022-04-18 NOTE — Progress Notes (Addendum)
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  In the emergency department he was febrile, lab work showed leukocytosis, hypercalcemia.CT C/A/P showed small gas collection in the gallbladder fossa following cholecystectomy,potential abscess formation,expansion of LEFT acetabular metastasis into the operator fossa.  Patient was initially admitted with diagnosis of sepsis secondary to biliary infection.  General surgery, IR consulted, started on antibiotics.  Sepsis physiology has improved.  Hospital course complicated by acute confusion, urinary retension, acute left lower extremity DVT.  PT/OT recommending SNF but insurance declined.  Family has appealed , decision pending.  Medically stable for discharge to SNF whenever possible.  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  There was concern for biliary infection/possible abscess but no definitive source identified.  General surgery, IR were following.  Complete 10 days course of IV antibiotics, resolution of fever.  Negative cultures.  Still has mild leukocytosis most likely secondary to underlying cancer.  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  Mental status has  improved but remains confused to time, obeys commands.  Not agitated  Urine  retention/AKI/hematuria: Improved renal function.  Failed voiding trial, patient will be discharged with Foley  Acute left lower extremity DVT: Extensive DVT as per the Doppler including multiple veins.  Case was discussed with oncology, currently on Eliquis .Edema has improved  Scrotal edema: Suspected to be related to volume overload.  Managed with scrotal elevation  Stage IV renal cancer with metastasis: Recent MRI showed brain metastasis.  On Decadron.  Continue Keppra for seizure control.  Oncology recommended palliative care/hospice approach  Normocytic anemia: Hemoglobin currently in the range of 7-9  Hypokalemia/hypophosphatemia: Currently being monitored and supplemented as needed  Goals of care: Metastatic lung cancer along with multiple medical problems.  Very poor prognosis.  Has brain metastasis.  Follows with oncology, Dr. Learta Codding who is  recommending palliative  care referral when he returns home.  CODE STATUS is DNR.  Will do in-house consultation for palliative care if he declines.          Pressure Injury 04/13/22 Sacrum Mid Stage 2 -  Partial thickness loss of dermis presenting as a shallow open injury with a red, pink wound bed without slough. (Active)  04/13/22 0900  Location: Sacrum  Location Orientation: Mid  Staging: Stage 2 -  Partial thickness loss of dermis presenting as a shallow open injury with a red, pink wound bed without slough.  Wound Description (Comments):   Present on Admission: No  Dressing Type Foam - Lift dressing to assess site every shift 04/18/22 1026    DVT prophylaxis:Place TED hose Start: 04/06/22 1453 SCDs Start: 03/30/22 1624 apixaban (ELIQUIS) tablet 5 mg     Code Status: DNR  Family Communication: None at the bedside  Patient status:Inpatient  Patient is from :Home  Anticipated discharge to:SNF?  Estimated DC date:not sure  Consultants: Oncology  Procedures: None  Antimicrobials:  Anti-infectives (From admission,  onward)    Start     Dose/Rate Route Frequency Ordered Stop   04/06/22 1030  amoxicillin-clavulanate (AUGMENTIN) 875-125 MG per tablet 1 tablet        1 tablet Oral Every 12 hours 04/06/22 0935 04/08/22 2127   04/05/22 1800  ceFEPIme (MAXIPIME) 2 g in sodium chloride 0.9 % 100 mL IVPB  Status:  Discontinued        2 g 200 mL/hr over 30 Minutes Intravenous Every 12 hours 04/05/22 1121 04/06/22 0935   03/31/22 1600  vancomycin (VANCOREADY) IVPB 1250 mg/250 mL  Status:  Discontinued        1,250 mg 166.7 mL/hr over 90 Minutes Intravenous Every 24 hours 03/31/22 0843 04/02/22 0809   03/31/22 1300  vancomycin (VANCOREADY) IVPB 1500 mg/300 mL  Status:  Discontinued        1,500 mg 150 mL/hr over 120 Minutes Intravenous Every 24 hours 03/30/22 1447 03/31/22 0843   03/30/22 2200  metroNIDAZOLE (FLAGYL) IVPB 500 mg  Status:  Discontinued        500 mg 100 mL/hr over 60 Minutes Intravenous Every 12 hours 03/30/22 1623 03/30/22 1630   03/30/22 2200  metroNIDAZOLE (FLAGYL) IVPB 500 mg  Status:  Discontinued        500 mg 100 mL/hr over 60 Minutes Intravenous Every 12 hours 03/30/22 1446 04/06/22 0935   03/30/22 2100  ceFEPIme (MAXIPIME) 2 g in sodium chloride 0.9 % 100 mL IVPB  Status:  Discontinued        2 g 200 mL/hr over 30 Minutes Intravenous Every 8 hours 03/30/22 1440 04/05/22 1121   03/30/22 1045  vancomycin (VANCOREADY) IVPB 2000 mg/400 mL        2,000 mg 200 mL/hr over 120 Minutes Intravenous  Once 03/30/22 1038 03/30/22 1529   03/30/22 1030  ceFEPIme (MAXIPIME) 2 g in sodium chloride 0.9 % 100 mL IVPB        2 g 200 mL/hr over 30 Minutes Intravenous  Once 03/30/22 1029 03/30/22 1250   03/30/22 1030  metroNIDAZOLE (FLAGYL) IVPB 500 mg        500 mg 100 mL/hr over 60 Minutes Intravenous  Once 03/30/22 1029 03/30/22 1400   03/30/22 1030  vancomycin (VANCOCIN) IVPB 1000 mg/200 mL premix  Status:  Discontinued        1,000 mg 200 mL/hr over 60 Minutes Intravenous  Once 03/30/22 1029  03/30/22 1038       Subjective: Patient seen and examined at bedside today.  He was eating his lunch.  Looks very comfortable, lying in bed.  Not in any current distress.  Follows commands but is confused to time.  Not agitated.  On room air.  Denies any shortness of breath or cough.  Objective: Vitals:   04/17/22 0427 04/17/22 1312 04/17/22 2023 04/18/22 0550  BP: 132/62 137/65 139/63 (!) 118/59  Pulse: 69 94 95 73  Resp: 18 16 18 17  $ Temp: 98.7 F (37.1 C) 99.2 F (37.3 C) 98.2 F (36.8 C) 97.7 F (36.5 C)  TempSrc: Oral Oral Oral   SpO2: 100% 98% 98% 98%  Weight:      Height:        Intake/Output Summary (Last 24 hours) at 04/18/2022 1104 Last data filed at 04/18/2022 0100 Gross per 24 hour  Intake 810 ml  Output 2675 ml  Net -1865 ml   Filed Weights   03/30/22 1029 04/16/22 0500  Weight: 102.1 kg 95.2 kg    Examination:  General exam: Overall comfortable, not in distress, deconditioned HEENT: PERRL Respiratory system:  no wheezes or crackles  Cardiovascular system: S1 & S2 heard, RRR.  Gastrointestinal system: Abdomen is nondistended, soft and nontender. Central nervous system: Alert and awake, not oriented to time, follows commands, weakness in bilateral lower extremities Extremities: No edema, no clubbing ,no cyanosis Skin: No rashes, no ulcers,no icterus     Data Reviewed: I have personally reviewed following labs and imaging studies  CBC: No results for input(s): "WBC", "NEUTROABS", "HGB", "HCT", "MCV", "PLT" in the last 168 hours. Basic Metabolic Panel: No results for input(s): "NA", "K", "CL", "CO2", "GLUCOSE", "BUN", "CREATININE", "CALCIUM", "MG", "PHOS" in the last 168 hours.   No results found for this or any previous visit (from the past 240 hour(s)).   Radiology Studies: No results found.  Scheduled Meds:  apixaban  5 mg Oral BID   Chlorhexidine Gluconate Cloth  6 each Topical Daily   dexamethasone  4 mg Oral Daily   ferrous sulfate  325  mg Oral Q breakfast   levETIRAcetam  250 mg Oral BID   mouth rinse  15 mL Mouth Rinse 4 times per day   oxyCODONE  10-20 mg Oral Daily   oxyCODONE  20 mg Oral QHS   testosterone  5 g Transdermal Daily   Continuous Infusions:   LOS: 19 days   Shelly Coss, MD [facility] P2/21/2024, 11:04 AM

## 2022-04-18 NOTE — Progress Notes (Signed)
Physical Therapy Treatment Patient Details Name: Phillip [name] MRN: [id number] DOB: [birth year] Today's Date: 04/18/2022   History of Present Illness Pt is a 77 year old male admitted for Sepsis secondary to biliary infection and with history of S4 RCC w/ liver, pulm and brain mets.  PMHx including but not limited to anemia, CKD, renal CA, HTN, seizure, cholecystitis in 11/23 with cholecystostomy tube placed - and lap cholecystectomy 03/15/22.  A brain MRI this week confirmed increase in the size of a dominant left frontal lobe metastasis with mild worsening of edema.    PT Comments    Pt with mildly improved activity tolerance today with increased sit to stand repetitions and able to take ~6-8 side steps and turn hips to midline for transfer to recliner. Education to pt and spouse on performance of LE there ex to be performed outside of therapy sessions for promotion of circulation and strengthening. Pt requires MOD A for bed mobility and MOD A+2 for all transfers. Pt is currently not at adequate mobility level for d/c to home as he only has +1 caregiver support at this time. Pt will benefit from continued skilled PT to increase their independence and maximize safety with mobility.     Recommendations for follow up therapy are one component of a multi-disciplinary discharge planning process, led by the attending physician.  Recommendations may be updated based on patient status, additional functional criteria and insurance authorization.  Follow Up Recommendations  Skilled nursing-short term rehab (<3 hours/day) Can patient physically be transported by private vehicle: No   Assistance Recommended at Discharge Frequent or constant Supervision/Assistance  Patient can return home with the following Two people to help with walking and/or transfers;Two people to help with bathing/dressing/bathroom;Help with stairs or ramp for entrance;Assistance with cooking/housework;Assist for  transportation;Assistance with feeding;Direct supervision/assist for medications management   Equipment Recommendations  None recommended by PT    Recommendations for Other Services       Precautions / Restrictions Precautions Precautions: Fall Restrictions Weight Bearing Restrictions: No     Mobility  Bed Mobility Overal bed mobility: Needs Assistance Bed Mobility: Supine to Sit     Supine to sit: Mod assist, HOB elevated     General bed mobility comments: pt pulling up on therapist's hand to pull trunk into sitting. Intermittent assist required for full progression of LEs to EOB. spouse at bedside reports pt sleeps in recliner/lift chair at home    Transfers Overall transfer level: Needs assistance Equipment used: Rolling walker (2 wheels) Transfers: Sit to/from Stand, Bed to chair/wheelchair/BSC Sit to Stand: Mod assist, +2 physical assistance, From elevated surface   Step pivot transfers: Mod assist, +2 physical assistance       General transfer comment: STSx5; x2 from EOB and x3 from elevated recliner chair. mod A +2 to power up. Multimodal cuing for forward gaze and and increased hip extension. Pt standing with hips flexed and forward head/trunk flexed. Observed decreased foot clearance bilaterally with R>L.  Needs assist to maneuver RW with step pivot transfer. Able to take ~6-8 steps to recliner, fatigues quickly and attempting to sit prior to being close enough to recliner chair requiring verbal cues to take step backward first. Goal to attempt forward steps, but pt too fatigued and standing tolerance regressed.    Ambulation/Gait                   Chief Strategy Officer  Modified Rankin (Stroke Patients Only)       Balance Overall balance assessment: Needs assistance Sitting-balance support: No upper extremity supported, Feet supported Sitting balance-Leahy Scale: Fair     Standing balance support: Bilateral upper  extremity supported, During functional activity, Reliant on assistive device for balance Standing balance-Leahy Scale: Zero Standing balance comment: modA+2 with RW                            Cognition Arousal/Alertness: Awake/alert Behavior During Therapy: WFL for tasks assessed/performed Overall Cognitive Status: Impaired/Different from baseline Area of Impairment: Problem solving                             Problem Solving: Slow processing General Comments: pt pleasant, follows commands, needing increased time and cues occasionally        Exercises      General Comments        Pertinent Vitals/Pain Pain Assessment Pain Assessment: Faces Faces Pain Scale: Hurts little more Pain Location: R hip Pain Descriptors / Indicators: Sore Pain Intervention(s): Limited activity within patient's tolerance, Monitored during session, Repositioned (improved at end of session when up in chair)    Home Living                          Prior Function            PT Goals (current goals can now be found in the care plan section) Acute Rehab PT Goals Patient Stated Goal: improve strength and energy PT Goal Formulation: With patient/family (pt's wife would like pt to be able to perform transfers with +1 supervision with ultimate goal of pt going home when at adequate mobility level) Time For Goal Achievement: 04/29/22 Potential to Achieve Goals: Fair Progress towards PT goals: Progressing toward goals    Frequency    Min 2X/week      PT Plan Current plan remains appropriate    Co-evaluation              AM-PAC PT "6 Clicks" Mobility   Outcome Measure  Help needed turning from your back to your side while in a flat bed without using bedrails?: A Lot Help needed moving from lying on your back to sitting on the side of a flat bed without using bedrails?: A Lot Help needed moving to and from a bed to a chair (including a wheelchair)?:  Total Help needed standing up from a chair using your arms (e.g., wheelchair or bedside chair)?: Total Help needed to walk in hospital room?: Total Help needed climbing 3-5 steps with a railing? : Total 6 Click Score: 8    End of Session Equipment Utilized During Treatment: Gait belt Activity Tolerance: Patient tolerated treatment well Patient left: in chair;with call bell/phone within reach;with family/visitor present;with chair alarm set Nurse Communication: Mobility status PT Visit Diagnosis: Muscle weakness (generalized) (M62.81)     Time: [id number] PT Time Calculation (min) (ACUTE ONLY): 29 min  Charges:  $Therapeutic Activity: 23-37 mins                     Festus Barren PT, DPT  Acute Rehabilitation Services  Office [phone]  04/18/2022, 3:03 PM

## 2022-04-19 ENCOUNTER — Other Ambulatory Visit (HOSPITAL_COMMUNITY): Payer: Self-pay

## 2022-04-19 DIAGNOSIS — Z7189 Other specified counseling: Secondary | ICD-10-CM

## 2022-04-19 DIAGNOSIS — R6510 Systemic inflammatory response syndrome (SIRS) of non-infectious origin without acute organ dysfunction: Secondary | ICD-10-CM | POA: Diagnosis not present

## 2022-04-19 DIAGNOSIS — C7931 Secondary malignant neoplasm of brain: Secondary | ICD-10-CM | POA: Diagnosis not present

## 2022-04-19 DIAGNOSIS — C649 Malignant neoplasm of unspecified kidney, except renal pelvis: Secondary | ICD-10-CM | POA: Diagnosis not present

## 2022-04-19 DIAGNOSIS — G9341 Metabolic encephalopathy: Secondary | ICD-10-CM | POA: Diagnosis not present

## 2022-04-19 DIAGNOSIS — Z515 Encounter for palliative care: Secondary | ICD-10-CM | POA: Diagnosis not present

## 2022-04-19 LAB — CBC
HCT: 23.2 % — ABNORMAL LOW (ref 39.0–52.0)
Hemoglobin: 6.8 g/dL — CL (ref 13.0–17.0)
MCH: 24.4 pg — ABNORMAL LOW (ref 26.0–34.0)
MCHC: 29.3 g/dL — ABNORMAL LOW (ref 30.0–36.0)
MCV: 83.2 fL (ref 80.0–100.0)
Platelets: 347 10*3/uL (ref 150–400)
RBC: 2.79 MIL/uL — ABNORMAL LOW (ref 4.22–5.81)
RDW: 17.5 % — ABNORMAL HIGH (ref 11.5–15.5)
WBC: 14.5 10*3/uL — ABNORMAL HIGH (ref 4.0–10.5)
nRBC: 0 % (ref 0.0–0.2)

## 2022-04-19 LAB — IRON AND TIBC
Iron: 38 ug/dL — ABNORMAL LOW (ref 45–182)
Saturation Ratios: 17 % — ABNORMAL LOW (ref 17.9–39.5)
TIBC: 223 ug/dL — ABNORMAL LOW (ref 250–450)
UIBC: 185 ug/dL

## 2022-04-19 LAB — OCCULT BLOOD X 1 CARD TO LAB, STOOL: Fecal Occult Bld: POSITIVE — AB

## 2022-04-19 LAB — PREPARE RBC (CROSSMATCH)

## 2022-04-19 MED ORDER — SODIUM CHLORIDE 0.9% IV SOLUTION: Freq: Once | INTRAVENOUS | Status: AC

## 2022-04-19 MED ORDER — APIXABAN 5 MG PO TABS: 5.0000 mg | ORAL_TABLET | Freq: Two times a day (BID) | ORAL | Status: DC

## 2022-04-19 NOTE — TOC Progression Note (Addendum)
Transition of Care [facility]) - Progression Note    Patient Details  Name: Phillip [name] MRN: [id number] Date of Birth: 12-Apr-1945  Transition of Care [facility]) CM/SW [place], [place] Phone Number: 04/19/2022, 12:35 PM  Clinical Narrative:     CSW contacted RN at pt's PCP office via secure chat requesting any updates on appeal; awaiting response.   CSW contacted [place]; left voicemail requesting update on appeal and/or contact info for 3rd party evaluator ([employer]). Awaiting response.  1340: CSW received voicemail from [place] referring CSW to her supervisor, Forensic scientist at [phone]. CSW called [place] and is informed that [employer] upheld the auth denial; pt is not approved for insurance coverage of SNF. CSW is informed that pt should already be aware.   1345: CSW called and updated spouse. She was not aware of appeal denial. She states she is going to call [employer] and call CSW back.   58: Spoke with pt's wife who states that she called [employer] and was informed there is another appeal she can do. She plans to pursue this appeal. She was informed that appeal information was sent to pt's Oncologist who spouse plans to follow up with. The appeal info was also mailed to pts home.   CSW inquires what alternative plans family would have if this appeal was denied as well. Spouse states she has looked into private duty care providers and explained family would likely private pay for SNF but she would have to discuss with family to come up with money to cover the cost. She has spoken to SNF about cost. CSW discusses potential for family to arrange private pay for SNF while waiting for appeal so pt can go to rehab sooner, avoid possible hospital fees not covered by insurance, and still have potential for appeal to cover SNF if auth ends up being approved. She is willing to consider this option. She plans to get appeal paperwork from Oncologist and follow up with family/SNF with private  pay option.     1545: CSW called [employer] at [phone] to confirm there is another appeal available. She confirmed that pt/family can do an "ALJ" appeal but that it can take quite a while and has to go to court.   Expected Discharge Plan: [place] Barriers to Discharge: Continued Medical Work up  Expected Discharge Plan and [place] arrangements for the past 2 months: Single Family Home Expected Discharge Date: 04/11/22                                     Social Determinants of Health (SDOH) Interventions [place]: No Food Insecurity (03/30/2022)  Housing: Low Risk  (03/30/2022)  Transportation Needs: No Transportation Needs (03/30/2022)  Utilities: Not At Risk (03/30/2022)  Tobacco Use: Low Risk  (03/30/2022)    Readmission Risk Interventions    01/09/2022    1:14 PM  Readmission Risk Prevention Plan  Transportation Screening Complete  PCP or Specialist Appt within 3-5 Days Complete  Social Work Consult for [place] Planning/Counseling Complete  Palliative Care Screening Complete  Medication Review Press photographer) Complete

## 2022-04-19 NOTE — Progress Notes (Signed)
Physical Therapy Treatment Patient Details Name: Benhard Alpizar MRN: [id number] DOB: 09-19-1945 Today's Date: 04/19/2022   History of Present Illness Pt is a 77 year old male admitted for Sepsis secondary to biliary infection and with history of S4 RCC w/ liver, pulm and brain mets.  PMHx including but not limited to anemia, CKD, renal CA, HTN, seizure, cholecystitis in 11/23 with cholecystostomy tube placed - and lap cholecystectomy 03/15/22.  A brain MRI this week confirmed increase in the size of a dominant left frontal lobe metastasis with mild worsening of edema.    PT Comments    Pt continues to make progress with mobility. Today's PT session focused on bed mobility and lateral transfer from EOB to chair. Pt with good arm strength and demonstrating ability to perform lateral transfer with intermittent Min A for lifting hips to perform scooting-easily fatigued requiring increased time to complete. Pt will benefit form continued skilled PT services in order to maximize functional mobility and improve independence with ultimate goal being to return home with his wife support.     Recommendations for follow up therapy are one component of a multi-disciplinary discharge planning process, led by the attending physician.  Recommendations may be updated based on patient status, additional functional criteria and insurance authorization.  Follow Up Recommendations  Skilled nursing-short term rehab (<3 hours/day) Can patient physically be transported by private vehicle: No   Assistance Recommended at Discharge Frequent or constant Supervision/Assistance  Patient can return home with the following Two people to help with walking and/or transfers;Two people to help with bathing/dressing/bathroom;Help with stairs or ramp for entrance;Assistance with cooking/housework;Assist for transportation;Assistance with feeding;Direct supervision/assist for medications management   Equipment Recommendations   None recommended by PT    Recommendations for Other Services       Precautions / Restrictions Precautions Precautions: Fall Restrictions Weight Bearing Restrictions: No     Mobility  Bed Mobility Overal bed mobility: Needs Assistance Bed Mobility: Supine to Sit     Supine to sit: Mod assist, HOB elevated     General bed mobility comments: improved initiation of B LEs to EOB and to bring trunk to upright.    Transfers Overall transfer level: Needs assistance Equipment used: None Transfers: Bed to chair/wheelchair/BSC Sit to Stand: Min assist, +2 safety/equipment, Mod assist, +2 physical assistance (increased assistance as pt fatigues)          Lateral/Scoot Transfers: Min assist, +2 safety/equipment General transfer comment: STS x2 from recliner, able to stand with as little as MIN A +2 and use of B UEs for power up to stand. MOD A+2 required with fatigue and poor standing tolerance when fatigued. Pt able to perform lateral transfer from EOB to drop arm recliner with MIN A and increased time. requires cues for continued progression. Intermittent assist to lift hips to scoot.    Ambulation/Gait                   Stairs             Wheelchair Mobility    Modified Rankin (Stroke Patients Only)       Balance Overall balance assessment: Needs assistance Sitting-balance support: No upper extremity supported, Feet supported Sitting balance-Leahy Scale: Fair     Standing balance support: Bilateral upper extremity supported, During functional activity, Reliant on assistive device for balance Standing balance-Leahy Scale: Poor  Cognition Arousal/Alertness: Awake/alert Behavior During Therapy: WFL for tasks assessed/performed Overall Cognitive Status: Impaired/Different from baseline Area of Impairment: Problem solving, Attention, Memory, Following commands, Safety/judgement, Awareness                    Current Attention Level: Sustained Memory: Decreased short-term memory Following Commands: Follows one step commands with increased time Safety/Judgement: Decreased awareness of safety, Decreased awareness of deficits Awareness: Emergent Problem Solving: Slow processing General Comments: pt pleasant, follows commands, needing increased time and cues occasionally        Exercises Other Exercises Other Exercises: encouraged chair marching and LAQ with LEs down    General Comments        Pertinent Vitals/Pain Pain Assessment Pain Assessment: Faces Faces Pain Scale: Hurts a little bit Breathing: normal Negative Vocalization: none Facial Expression: smiling or inexpressive Body Language: relaxed Consolability: no need to console PAINAD Score: 0 Pain Location: R hip and back Pain Descriptors / Indicators: Sore, Discomfort Pain Intervention(s): Monitored during session, Repositioned    Home Living Family/patient expects to be discharged to:: Private residence Living Arrangements: Spouse/significant other Available Help at Discharge: Family Type of Home: House Home Access: Ramped entrance       Home Layout: One level Home Equipment: Advice worker (2 wheels);Shower seat - built in;Grab bars - toilet;Grab bars - tub/shower;BSC/3in1;Cane - single point Additional Comments: home set up and prior function per pt's last admission 2 weeks ago    Prior Function            PT Goals (current goals can now be found in the care plan section) Acute Rehab PT Goals Patient Stated Goal: improve strength and energy PT Goal Formulation: With patient/family (pt's wife would like pt to be able to perform transfers with +1 supervision with ultimate goal of pt going home when at adequate mobility level) Time For Goal Achievement: 04/29/22 Potential to Achieve Goals: Fair Progress towards PT goals: Progressing toward goals    Frequency    Min 2X/week      PT Plan  Current plan remains appropriate    Co-evaluation              AM-PAC PT "6 Clicks" Mobility   Outcome Measure  Help needed turning from your back to your side while in a flat bed without using bedrails?: A Lot Help needed moving from lying on your back to sitting on the side of a flat bed without using bedrails?: A Lot Help needed moving to and from a bed to a chair (including a wheelchair)?: Total Help needed standing up from a chair using your arms (e.g., wheelchair or bedside chair)?: Total Help needed to walk in hospital room?: Total Help needed climbing 3-5 steps with a railing? : Total 6 Click Score: 8    End of Session Equipment Utilized During Treatment: Gait belt Activity Tolerance: Patient tolerated treatment well Patient left: in chair;with call bell/phone within reach;with family/visitor present;with chair alarm set Nurse Communication: Mobility status PT Visit Diagnosis: Muscle weakness (generalized) (M62.81)     Time: 1428-1500 PT Time Calculation (min) (ACUTE ONLY): 32 min  Charges:  $Therapeutic Activity: 8-22 mins                     Festus Barren PT, DPT  Acute Rehabilitation Services  Office [phone] 04/19/2022, 4:07 PM

## 2022-04-19 NOTE — Progress Notes (Signed)
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  In the emergency department he was febrile, lab work showed leukocytosis, hypercalcemia.CT C/A/P showed small gas collection in the gallbladder fossa following cholecystectomy,potential abscess formation,expansion of LEFT acetabular metastasis into the operator fossa.  Patient was initially admitted with diagnosis of sepsis secondary to biliary infection.  General surgery, IR consulted, started on antibiotics.  Sepsis physiology has improved.  Hospital course complicated by acute confusion, urinary retension, acute left lower extremity DVT.  PT/OT recommending SNF but insurance declined.  Family has appealed , decision pending.  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  There was concern for biliary infection/possible abscess but no definitive source identified.  General surgery, IR were following.  Completed 10 days course of IV antibiotics, resolution of fever.  Negative cultures.  Still has mild leukocytosis most likely secondary to underlying cancer.  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  Mental status has  improved but remains confused to time, obeys commands.  Not agitated  Urine retention/AKI/hematuria: Improved renal function.  Failed voiding  trial, patient will be discharged with Foley  Acute left lower extremity DVT: Extensive DVT as per the Doppler including multiple veins.  Case was discussed with oncology, currently on Eliquis ( on hold today for anemia) .Edema has improved  Scrotal edema: Suspected to be related to volume overload.  Managed with scrotal elevation  Stage IV renal cancer with metastasis: Recent MRI showed brain metastasis.  On Decadron.  Continue Keppra for seizure control.  Oncology recommended palliative care/hospice approach  Normocytic anemia: Hemoglobin dropped to the range of 6 today.  This is most likely associated with malignancy.  Checking FOBT.  No report of hematochezia or melena.  Checking iron studies.  Being transfused with a unit of PRBC  Hypokalemia/hypophosphatemia: Currently being monitored and supplemented as needed  Goals of care: Metastatic lung cancer along with multiple medical problems.  Very poor prognosis.  Has brain metastasis.  Follows with oncology, Dr. Learta Codding who is  recommending palliative  care referral when he returns home.  CODE STATUS is DNR.  Consulted in-house consultation for palliative care          Pressure Injury 04/13/22 Sacrum Mid Stage 2 -  Partial thickness loss of dermis presenting as a shallow open injury with a red, pink wound bed without slough. (Active)  04/13/22 0900  Location: Sacrum  Location Orientation: Mid  Staging: Stage 2 -  Partial thickness loss of dermis presenting as a shallow open injury with a red, pink wound bed without slough.  Wound Description (Comments):   Present on Admission: No  Dressing Type Foam - Lift dressing to assess site every shift 04/19/22 0945    DVT prophylaxis:Place TED hose Start: 04/06/22 1453 SCDs Start: 03/30/22 1624 apixaban (ELIQUIS) tablet 5 mg     Code Status: DNR  Family  Communication: None at the bedside  Patient status:Inpatient  Patient is from :Home  Anticipated discharge to:SNF?  Estimated DC  date:not sure   Consultants: Oncology  Procedures: None  Antimicrobials:  Anti-infectives (From admission, onward)    Start     Dose/Rate Route Frequency Ordered Stop   04/06/22 1030  amoxicillin-clavulanate (AUGMENTIN) 875-125 MG per tablet 1 tablet        1 tablet Oral Every 12 hours 04/06/22 0935 04/08/22 2127   04/05/22 1800  ceFEPIme (MAXIPIME) 2 g in sodium chloride 0.9 % 100 mL IVPB  Status:  Discontinued        2 g 200 mL/hr over 30 Minutes Intravenous Every 12 hours 04/05/22 1121 04/06/22 0935   03/31/22 1600  vancomycin (VANCOREADY) IVPB 1250 mg/250 mL  Status:  Discontinued        1,250 mg 166.7 mL/hr over 90 Minutes Intravenous Every 24 hours 03/31/22 0843 04/02/22 0809   03/31/22 1300  vancomycin (VANCOREADY) IVPB 1500 mg/300 mL  Status:  Discontinued        1,500 mg 150 mL/hr over 120 Minutes Intravenous Every 24 hours 03/30/22 1447 03/31/22 0843   03/30/22 2200  metroNIDAZOLE (FLAGYL) IVPB 500 mg  Status:  Discontinued        500 mg 100 mL/hr over 60 Minutes Intravenous Every 12 hours 03/30/22 1623 03/30/22 1630   03/30/22 2200  metroNIDAZOLE (FLAGYL) IVPB 500 mg  Status:  Discontinued        500 mg 100 mL/hr over 60 Minutes Intravenous Every 12 hours 03/30/22 1446 04/06/22 0935   03/30/22 2100  ceFEPIme (MAXIPIME) 2 g in sodium chloride 0.9 % 100 mL IVPB  Status:  Discontinued        2 g 200 mL/hr over 30 Minutes Intravenous Every 8 hours 03/30/22 1440 04/05/22 1121   03/30/22 1045  vancomycin (VANCOREADY) IVPB 2000 mg/400 mL        2,000 mg 200 mL/hr over 120 Minutes Intravenous  Once 03/30/22 1038 03/30/22 1529   03/30/22 1030  ceFEPIme (MAXIPIME) 2 g in sodium chloride 0.9 % 100 mL IVPB        2 g 200 mL/hr over 30 Minutes Intravenous  Once 03/30/22 1029 03/30/22 1250   03/30/22 1030  metroNIDAZOLE (FLAGYL) IVPB 500 mg        500 mg 100 mL/hr over 60 Minutes Intravenous  Once 03/30/22 1029 03/30/22 1400   03/30/22 1030  vancomycin (VANCOCIN) IVPB 1000 mg/200  mL premix  Status:  Discontinued        1,000 mg 200 mL/hr over 60 Minutes Intravenous  Once 03/30/22 1029 03/30/22 1038       Subjective: Patient seen and examined at bedside today.  Remains comfortable.  Hemodynamically stable.  Lying in bed.  Not sure about day but cannot say month and year.  Obeys commands.  Denies any complaints.  No report of hematochezia or melena  Objective: Vitals:   04/18/22 2018 04/19/22 0434 04/19/22 1013 04/19/22 1039  BP: 131/60 (!) 109/52 (!) 124/56 (!) 116/54  Pulse: 79 66 89 97  Resp: 18 18 18 18  $ Temp: 98.9 F (37.2 C) 97.6 F (36.4 C) 98 F (36.7 C) 98.2 F (36.8 C)  TempSrc: Oral Oral Oral Oral  SpO2: 97% 98% 98% 98%  Weight:      Height:        Intake/Output Summary (Last 24 hours) at 04/19/2022 1127 Last data filed at 04/19/2022 1041 Gross per 24 hour  Intake 0  ml  Output 2550 ml  Net -2550 ml   Filed Weights   03/30/22 1029 04/16/22 0500  Weight: 102.1 kg 95.2 kg    Examination:  General exam: Overall comfortable, not in distress, deconditioned, obese HEENT: PERRL Respiratory system:  no wheezes or crackles  Cardiovascular system: S1 & S2 heard, RRR.  Gastrointestinal system: Abdomen is nondistended, soft and nontender. Central nervous system: Alert and awake, oriented to place, knows month and year, obeys commands Extremities: No edema, no clubbing ,no cyanosis Skin: No rashes, no ulcers,no icterus      Data Reviewed: I have personally reviewed following labs and imaging studies  CBC: Recent Labs  Lab 04/19/22 0414  WBC 14.5*  HGB 6.8*  HCT 23.2*  MCV 83.2  PLT [id number]   Basic Metabolic Panel: No results for input(s): "NA", "K", "CL", "CO2", "GLUCOSE", "BUN", "CREATININE", "CALCIUM", "MG", "PHOS" in the last 168 hours.   No results found for this or any previous visit (from the past 240 hour(s)).   Radiology Studies: No results found.  Scheduled Meds:  sodium chloride   Intravenous Once   [START ON 04/20/2022]  apixaban  5 mg Oral BID   Chlorhexidine Gluconate Cloth  6 each Topical Daily   dexamethasone  4 mg Oral Daily   ferrous sulfate  325 mg Oral Q breakfast   levETIRAcetam  250 mg Oral BID   mouth rinse  15 mL Mouth Rinse 4 times per day   oxyCODONE  10-20 mg Oral Daily   oxyCODONE  20 mg Oral QHS   testosterone  5 g Transdermal Daily   Continuous Infusions:   LOS: 20 days   Shelly Coss, MD [facility] P2/22/2024, 11:27 AM

## 2022-04-19 NOTE — Consult Note (Signed)
Palliative Care Consult Note                                  Date: 04/19/2022   Patient Name: Phillip [name]  DOB: 11/22/1945  MRN: [id number]  Age / Sex: 77 y.o., male  PCP: Charlane Ferretti, MD Referring Physician: Shelly Coss, MD  Reason for Consultation: Establishing goals of care  HPI/Patient Profile: 77 y.o. 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  Patient was initially admitted with a diagnosis of sepsis secondary to biliary infection.  Hospital course has been complicated by confusion, urinary retention, and acute left lower extremity DVT. PT/OT recommending SNF/rehab but insurance has declined.  Palliative Medicine has been consulted for goals of care.   Past Medical History:  Diagnosis Date   Anemia    Anxiety    Chronic kidney disease    renal cancer   Constipation    Depression    Hypertension    Seizures ([place])    last seizure 01/2019 - controlled since on keppra    Subjective:   I have reviewed medical records including progress notes, labs and imaging.  I met with patient and his wife [name] at bedside to discuss diagnosis, prognosis, GOC, disposition, and options. Patient is currently out of bed to the recliner and has no acute complaints.   I introduced Palliative Medicine as specialized medical care for people living with serious illness. It focuses on providing relief from the symptoms and stress of a serious illness.   A brief life review was discussed. Azavier and [name] have a blended family (4 children between them).  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  Phillip [name] was diagnosed with metastatic renal cell carcinoma in 2017.  He is status post radiation and chemotherapy, and then was maintained on immunotherapy (Keytruda and  lenvatinib). Overall, he has done quite well up until the past few months.  Prior to November 2023, Rhonin was ambulatory with a walker and still able to participate in activities he enjoyed, including attending church and going to [employer].   We discussed patient's current medical condition and what it means in the larger context of his ongoing co-morbidities. Natural disease trajectory of advanced cancer was discussed.  Values and goals of care were discussed.  The goal is for [name] to improve his functional status with the ultimate goal of returning home.  In order for [name] to be able to care for him at home, he needs to be able to stand and pivot. [name] has remained very motivated working with PT and continues to make progress with mobility.  He would clearly benefit from rehab in order to maximize functional mobility and regain some degree of independence.  I discussed with [name] my recommendation to consider the addition of palliative support at at discharge when placement is found. Discussed that outpatient palliative would provide ongoing support and continuity of care with the goal of improved quality of life.  Questions and concerns addressed. Patient/family encouraged to call with questions or concerns.     Review of Systems  Neurological:  Positive for weakness.    Objective:   Primary Diagnoses: Present on Admission:  SIRS (systemic inflammatory response syndrome) ([name])  HTN (hypertension)  Renal cell carcinoma with metastasis to  brain, lung, bone  Hypercalcemia  Acute metabolic encephalopathy   Physical Exam Vitals reviewed.  Constitutional:      General: He is not in acute distress.    Comments: Chronically ill-appearing  Neurological:     Mental Status: He is alert.     Motor: Weakness present.    Vital Signs:  BP 130/61   Pulse 75   Temp 97.8 F (36.6 C) (Oral)   Resp 16   Ht '5\' 11"'$  (1.803 m)   Wt 95.2 kg   SpO2 97%   BMI 29.27 kg/m    Palliative Assessment/Data: PPS 40%     Assessment & Plan:   SUMMARY OF RECOMMENDATIONS   Continue current supportive interventions Goal of care - rehab to improve functional status with ultimate goal of returning home Outpatient palliative referral  Primary Decision Maker: Patient with support from his wife  Code Status/Advance Care Planning: DNR  Prognosis:  Unable to determine  Discharge Planning:  To Be Determined     Thank you for allowing [name] to participate in the care of Phillip [name]   Signed by: Elie Confer, NP Palliative Medicine Team  Team Phone # [phone]  For individual providers, please see AMION

## 2022-04-19 NOTE — Evaluation (Signed)
Occupational Therapy Evaluation Patient Details Name: Phillip [name] MRN: [id number] DOB: 1946-01-27 Today's Date: 04/19/2022   History of Present Illness Pt is a 77 year old male admitted for Sepsis secondary to biliary infection and with history of S4 RCC w/ liver, pulm and brain mets.  PMHx including but not limited to anemia, CKD, renal CA, HTN, seizure, cholecystitis in 11/23 with cholecystostomy tube placed - and lap cholecystectomy 03/15/22.  A brain MRI this week confirmed increase in the size of a dominant left frontal lobe metastasis with mild worsening of edema.   Clinical Impression   PTA pt living at home with his wife, who provided min A with ADL tasks @ RW level. Wife states prior to gall bladder surgery (@5$  weeks ago) that he was able to care for himself. Pt demonstrates a significant decline in functional status due to deficits listed below. Pt has been followed by PT since 2/4 and required total A with bed mobility and unable to transfer at that time. Pt has demonstrated significant improvement and is able to complete lateral scoot transfer and modified sit - stand transfer with mod A +2 for safety. Wife's goal is for Phillip [name] to return home. Feel Phillip [name] has the ability to improve his independence with functional mobility and ADL tasks to eventually facilitate a safe DC home with 24/7 assistance of supportive wife. Acute OT to follow.      Recommendations for follow up therapy are one component of a multi-disciplinary discharge planning process, led by the attending physician.  Recommendations may be updated based on patient status, additional functional criteria and insurance authorization.   Follow Up Recommendations  Skilled nursing-short term rehab (<3 hours/day)     Assistance Recommended at Discharge Frequent or constant Supervision/Assistance  Patient can return home with the following Two people to help with walking and/or transfers;Two people to help with  bathing/dressing/bathroom;Assistance with cooking/housework;Direct supervision/assist for medications management;Direct supervision/assist for financial management;Assist for transportation;Help with stairs or ramp for entrance    Functional Status Assessment  Patient has had a recent decline in their functional status and demonstrates the ability to make significant improvements in function in a reasonable and predictable amount of time.  Equipment Recommendations  Hospital bed;Other (comment) Product manager lift)    Recommendations for Other Services       Precautions / Restrictions Precautions Precautions: Fall Restrictions Weight Bearing Restrictions: No      Mobility Bed Mobility Overal bed mobility: Needs Assistance Bed Mobility: Supine to Sit     Supine to sit: Mod assist, HOB elevated          Transfers Overall transfer level: Needs assistance Equipment used: Rolling walker (2 wheels) Transfers: Sit to/from Stand, Bed to chair/wheelchair/BSC Sit to Stand: +2 physical assistance, From elevated surface, Min assist, Mod assist (increased assistance as pt fatigues)                  Balance Overall balance assessment: Needs assistance Sitting-balance support: No upper extremity supported, Feet supported Sitting balance-Leahy Scale: Fair     Standing balance support: Bilateral upper extremity supported, During functional activity, Reliant on assistive device for balance Standing balance-Leahy Scale: Poor                             ADL either performed or assessed with clinical judgement   ADL Overall ADL's : Needs assistance/impaired     Grooming: Set up   Upper Body Bathing: Minimal  assistance;Sitting   Lower Body Bathing: Maximal assistance;Sit to/from stand   Upper Body Dressing : Minimal assistance   Lower Body Dressing: Maximal assistance;Sit to/from stand   Toilet Transfer: Moderate assistance;+2 for safety/equipment (lateral scoot;  simulated)   Toileting- Clothing Manipulation and Hygiene: Total assistance (incontinent of BM)       Functional mobility during ADLs: +2 for safety/equipment;Rolling walker (2 wheels);Minimal assistance General ADL Comments: lateral scoot transfer with mod A to problem solve and complete transfer; good ability to power forward during weight transition; able to stand with RW for pericare however stanidng tolerance is poor and unable to achieve full upright posture     Vision Baseline Vision/History:  (will assess)       Perception     Praxis      Pertinent Vitals/Pain Pain Assessment Pain Assessment: Faces Faces Pain Scale: Hurts a little bit Breathing: normal Negative Vocalization: none Facial Expression: smiling or inexpressive Body Language: relaxed Consolability: no need to console PAINAD Score: 0 Pain Location: R hip Pain Descriptors / Indicators: Sore, Discomfort Pain Intervention(s): Limited activity within patient's tolerance     Hand Dominance Right   Extremity/Trunk Assessment Upper Extremity Assessment Upper Extremity Assessment: Generalized weakness   Lower Extremity Assessment Lower Extremity Assessment: Defer to PT evaluation   Cervical / Trunk Assessment Cervical / Trunk Assessment: Kyphotic   Communication Communication Communication: Expressive difficulties;Receptive difficulties (nonverbal during session, cognition likely affecting communication)   Cognition Arousal/Alertness: Awake/alert Behavior During Therapy: WFL for tasks assessed/performed Overall Cognitive Status: Impaired/Different from baseline Area of Impairment: Problem solving, Attention, Memory, Following commands, Safety/judgement, Awareness                   Current Attention Level: Sustained Memory: Decreased short-term memory Following Commands: Follows one step commands with increased time Safety/Judgement: Decreased awareness of safety, Decreased awareness of  deficits Awareness: Emergent Problem Solving: Slow processing General Comments: pt pleasant, follows commands, needing increased time and cues occasionally     General Comments       Exercises Exercises: Other exercises Other Exercises Other Exercises: encouraged chair marching   Shoulder Instructions      Home Living Family/patient expects to be discharged to:: Private residence Living Arrangements: Spouse/significant other Available Help at Discharge: Family Type of Home: House Home Access: Ramped entrance     Home Layout: One level     Bathroom Shower/Tub: Occupational psychologist: Handicapped height Bathroom Accessibility: Yes How Accessible: Accessible via wheelchair;Accessible via walker (back bedroom accessible by wc) Home Equipment: Shower seat;Rolling [place] (2 wheels);Shower seat - built in;Grab bars - toilet;Grab bars - tub/shower;BSC/3in1;Cane - single point   Additional Comments: home set up and prior function per pt's last admission 2 weeks ago      Prior Functioning/Environment Prior Level of Function : Needs assist       Physical Assist : Mobility (physical);ADLs (physical)   ADLs (physical): Bathing;Dressing (wife assisted her husband into th eshower then he was able to bath himself; during a couple of weeks prior to admission, wife had increased assistance in order to conserve his energy for walking) Mobility Comments: R leg pain and weaker than L at baseline; Normally uses RW; can do short community gait; reports slow cautious speed; no falls; uses lift chair to stand but can stand from chair if has good armrest; sleeps in recliner ADLs Comments: Since November spouse assist minimally with showers and dressing; can do toileting on his own; spouse does IADLs  OT Problem List: Decreased strength;Decreased range of motion;Decreased activity tolerance;Impaired balance (sitting and/or standing);Decreased cognition;Decreased safety  awareness;Decreased knowledge of use of DME or AE;Obesity      OT Treatment/Interventions: Self-care/ADL training;Therapeutic exercise;Energy conservation;DME and/or AE instruction;Therapeutic activities;Cognitive remediation/compensation;Patient/family education;Balance training    OT Goals(Current goals can be found in the care plan section) Acute Rehab OT Goals Patient Stated Goal: wife's goal is for pt to get strgoner so he can DC home OT Goal Formulation: With patient/family Time For Goal Achievement: 05/03/22 Potential to Achieve Goals: Good  OT Frequency: Min 2X/week    Co-evaluation              AM-PAC OT "6 Clicks" Daily Activity     Outcome Measure Help from another person eating meals?: A Little Help from another person taking care of personal grooming?: A Little Help from another person toileting, which includes using toliet, bedpan, or urinal?: Total Help from another person bathing (including washing, rinsing, drying)?: A Lot Help from another person to put on and taking off regular upper body clothing?: A Little Help from another person to put on and taking off regular lower body clothing?: A Lot 6 Click Score: 14   End of Session Equipment Utilized During Treatment: Gait belt;Rolling walker (2 wheels) Nurse Communication: Mobility status  Activity Tolerance: Patient tolerated treatment well Patient left: in chair;with call bell/phone within reach;with chair alarm set;with family/visitor present  OT Visit Diagnosis: Unsteadiness on feet (R26.81);Other abnormalities of gait and mobility (R26.89);Muscle weakness (generalized) (M62.81);Other symptoms and signs involving cognitive function;Pain Pain - part of body:  (R hip; generalized)                Time: [id number] OT Time Calculation (min): 40 min Charges:  OT General Charges $OT Visit: 1 Visit OT Evaluation $OT Eval Moderate Complexity: [place], OT/L   Acute OT Clinical Specialist Acute  Rehabilitation Services Pager [phone] Office [phone]   [facility] 04/19/2022, 3:28 PM

## 2022-04-19 NOTE — Plan of Care (Signed)
  Problem: Fluid Volume: Goal: Hemodynamic stability will improve Outcome: Progressing   Problem: Respiratory: Goal: Ability to maintain adequate ventilation will improve Outcome: Progressing   Problem: Activity: Goal: Risk for activity intolerance will decrease Outcome: Progressing   Problem: Nutrition: Goal: Adequate nutrition will be maintained Outcome: Progressing

## 2022-04-20 ENCOUNTER — Other Ambulatory Visit (HOSPITAL_COMMUNITY): Payer: Self-pay

## 2022-04-20 DIAGNOSIS — Z515 Encounter for palliative care: Secondary | ICD-10-CM | POA: Diagnosis not present

## 2022-04-20 DIAGNOSIS — C7931 Secondary malignant neoplasm of brain: Secondary | ICD-10-CM | POA: Diagnosis not present

## 2022-04-20 DIAGNOSIS — G9341 Metabolic encephalopathy: Secondary | ICD-10-CM | POA: Diagnosis not present

## 2022-04-20 DIAGNOSIS — R6510 Systemic inflammatory response syndrome (SIRS) of non-infectious origin without acute organ dysfunction: Secondary | ICD-10-CM | POA: Diagnosis not present

## 2022-04-20 DIAGNOSIS — C649 Malignant neoplasm of unspecified kidney, except renal pelvis: Secondary | ICD-10-CM | POA: Diagnosis not present

## 2022-04-20 LAB — CBC
HCT: 27.5 % — ABNORMAL LOW (ref 39.0–52.0)
Hemoglobin: 8.3 g/dL — ABNORMAL LOW (ref 13.0–17.0)
MCH: 25.2 pg — ABNORMAL LOW (ref 26.0–34.0)
MCHC: 30.2 g/dL (ref 30.0–36.0)
MCV: 83.6 fL (ref 80.0–100.0)
Platelets: 339 10*3/uL (ref 150–400)
RBC: 3.29 MIL/uL — ABNORMAL LOW (ref 4.22–5.81)
RDW: 16.8 % — ABNORMAL HIGH (ref 11.5–15.5)
WBC: 13.7 10*3/uL — ABNORMAL HIGH (ref 4.0–10.5)
nRBC: 0 % (ref 0.0–0.2)

## 2022-04-20 LAB — TYPE AND SCREEN
ABO/RH(D): O POS
Antibody Screen: NEGATIVE
Unit division: 0

## 2022-04-20 LAB — BPAM RBC
Blood Product Expiration Date: 202403182359
ISSUE DATE / TIME: 202402221019
Unit Type and Rh: 5100

## 2022-04-20 LAB — BASIC METABOLIC PANEL
Anion gap: 7 (ref 5–15)
BUN: 11 mg/dL (ref 8–23)
CO2: 26 mmol/L (ref 22–32)
Calcium: 8.7 mg/dL — ABNORMAL LOW (ref 8.9–10.3)
Chloride: 96 mmol/L — ABNORMAL LOW (ref 98–111)
Creatinine, Ser: 0.47 mg/dL — ABNORMAL LOW (ref 0.61–1.24)
GFR, Estimated: >60 mL/min (ref >60)
Glucose, Bld: 174 mg/dL — ABNORMAL HIGH (ref 70–99)
Potassium: 3.9 mmol/L (ref 3.5–5.1)
Sodium: 129 mmol/L — ABNORMAL LOW (ref 135–145)

## 2022-04-20 MED ORDER — FERROUS SULFATE 325 (65 FE) MG PO TABS
325.0000 mg | ORAL_TABLET | Freq: Every day | ORAL | Status: DC
  Administered 2022-04-22 – 2022-04-30 (×9): 325 mg via ORAL
  Filled 2022-04-20 (×9): qty 1

## 2022-04-20 MED ORDER — SODIUM CHLORIDE 0.9 % IV SOLN
250.0000 mg | Freq: Every day | INTRAVENOUS | Status: AC
  Administered 2022-04-20 – 2022-04-21 (×2): 250 mg via INTRAVENOUS
  Filled 2022-04-20 (×2): qty 20

## 2022-04-20 MED ORDER — ENSURE ENLIVE PO LIQD
237.0000 mL | ORAL | Status: DC
  Administered 2022-04-20 – 2022-04-29 (×10): 237 mL via ORAL

## 2022-04-20 NOTE — Consult Note (Signed)
Referring Provider: [facility] Primary Care Physician:  Charlane Ferretti, MD Primary Gastroenterologist:  Althia Forts  Reason for Consultation: Anemia, positive fecal occult  HPI: Phillip [name] is a 77 y.o. male with medical history significant of metastatic renal cell carcinoma, HTN, seizure d/o presents for evaluation of anemia with positive fecal occult.  History of acute cholecystitis in November and underwent percutaneous cholecystotomy tube placement.  Subsequently underwent laparoscopic cholecystectomy 03/15/2022 with Dr. Ninfa Linden.  Patient presented with weakness and confusion.  CT abdomen pelvis showed small gas collection in the gallbladder fossa following cholecystectomy, potential abscess formation, expansion of the left acetabular metastasis into the operator fossa.  Patient was admitted for sepsis secondary to biliary infection.  Patient was then put on antibiotics.  Patient recently diagnosed with DVT and put on Eliquis  No previous colonoscopy  Patient states he is doing well this morning, tolerating soft diet without difficulty. Having soft formed bowel movements without melena or hematochezia. Denies abdominal pain, nausea, vomiting. Denies family history of colon cancer  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[place])    Chronic kidney disease    renal cancer   Constipation    Depression    Hypertension    Low testosterone in male    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   Seizures ([place])    last seizure 01/2019 - controlled since on keppra   Tachycardia    Wears glasses     Past Surgical History:  Procedure Laterality Date   CHOLECYSTECTOMY N/A 03/15/2022   Procedure: LAPAROSCOPIC CHOLECYSTECTOMY;  Surgeon: Coralie Keens, MD;  Location: [place];  Service: General;  Laterality: N/A;   insertion port-a-cath  2018   IR GENERIC HISTORICAL  11/18/2015   IR FLUORO GUIDED NEEDLE [place] ASPIRATION/INJECTION LOC 11/18/2015  [name], MD [facility]-INTERV RAD   IR GENERIC HISTORICAL  05/16/2016   IR FLUORO GUIDE PORT INSERTION RIGHT 05/16/2016 [name], MD [facility]-INTERV RAD   IR GENERIC HISTORICAL  05/16/2016   IR US GUIDE VASC ACCESS RIGHT 05/16/2016 [name], MD [facility]-INTERV RAD   IR PERC CHOLECYSTOSTOMY  01/09/2022   LITT procedure  2023   at [place] 06/16/2019   Procedure: MRI BRAIN WITH AND WITHOUT CONTRAST WITH ANESTHESIA;  Surgeon: Radiologist, Medication, MD;  Location: [place];  Service: Radiology;  Laterality: N/A;    Prior to Admission medications   Medication Sig Start Date End Date Taking? Authorizing Provider  Cholecalciferol (VITAMIN D) 50 MCG (2000 UT) CAPS Take 2,000 Units by mouth daily.   Yes [provider]  Cyanocobalamin (VITAMIN B12) 1000 MCG TBCR Take 1,000 mcg by mouth daily.   Yes [provider]  dexamethasone (DECADRON) 4 MG tablet Take 1 tablet (4 mg total) by mouth daily. Take for 14 days 03/29/22  Yes [name], MD  docusate sodium (COLACE) 100 MG capsule Take 200 mg by mouth 2 (two) times daily.   Yes [provider]  dorzolamide (TRUSOPT) 2 % ophthalmic solution Instill 1 drop into right eye three times daily. 10/27/21  Yes   levETIRAcetam (KEPPRA) 500 MG tablet Take 1 tablet (500 mg total) by mouth 2 (two) times daily. Patient taking differently: Take 250 mg by mouth 2 (two) times daily. 02/28/22  Yes [name], MD  lidocaine-prilocaine (EMLA) cream APPLY TO PORTACATH 1 HOUR PRIOR TO USE AS NEEDED Patient taking differently:  Apply 1 Application topically as needed (port). APPLY TO PORTACATH 1 HOUR PRIOR TO USE AS NEEDED 11/13/21  Yes [name], MD  losartan-hydrochlorothiazide (HYZAAR) 100-12.5 MG tablet Take 1 tablet by mouth once a day. 12/29/21  Yes   mirtazapine (REMERON) 30 MG tablet Take 1 tablet (30 mg total) by mouth at bedtime. 12/29/21  Yes [name], MD  ondansetron (ZOFRAN) 8 MG  tablet Take 1 tablet (8 mg total) by mouth every 8 (eight) hours as needed for nausea for vomiting. 01/29/22  Yes [name], MD  oxyCODONE (OXY IR/ROXICODONE) 5 MG immediate release tablet Take 1 tablet (5 mg total) by mouth every 6 (six) hours as needed for severe pain. 03/28/22  Yes Owens Shark, NP  oxyCODONE (OXYCONTIN) 10 mg 12 hr tablet Take 2 tablets (20 mg total) by mouth every 12 (twelve) hours. Patient taking differently: Take 10-20 mg by mouth See admin instructions. Take 10-20 mg in the morning and 20 mg at night 02/28/22  Yes Owens Shark, NP  polyethylene glycol (MIRALAX / GLYCOLAX) 17 g packet Take 17 g by mouth daily as needed for moderate constipation.   Yes [provider]  prochlorperazine (COMPAZINE) 10 MG tablet Take 1 tablet by mouth every 6 hours as needed for nausea or vomiting. 01/04/21  Yes Owens Shark, NP  saccharomyces boulardii (FLORASTOR) 250 MG capsule Take 250 mg by mouth 2 (two) times daily.   Yes [provider]  Testosterone 1.62 % GEL Apply 2 Pumps topically daily. 03/28/22  Yes Owens Shark, NP  apixaban (ELIQUIS) 5 MG TABS tablet Take 1 tablet (5 mg total) by mouth 2 (two) times daily. 04/11/22   Debbe Odea, MD  ferrous sulfate 325 (65 FE) MG tablet Take 1 tablet (325 mg total) by mouth daily with breakfast. 04/12/22   Debbe Odea, MD    Scheduled Meds:  Chlorhexidine Gluconate Cloth  6 each Topical Daily   dexamethasone  4 mg Oral Daily   [START ON 04/22/2022] ferrous sulfate  325 mg Oral Q breakfast   levETIRAcetam  250 mg Oral BID   mouth rinse  15 mL Mouth Rinse 4 times per day   oxyCODONE  10-20 mg Oral Daily   oxyCODONE  20 mg Oral QHS   testosterone  5 g Transdermal Daily   Continuous Infusions:  ferric gluconate (FERRLECIT) IVPB     PRN Meds:.acetaminophen, hydrALAZINE, morphine injection, mouth rinse, oxyCODONE, prochlorperazine, sodium chloride flush  Allergies as of 03/30/2022 - Review Complete 03/30/2022   Allergen Reaction Noted   Contrast media [iodinated contrast media] Rash 10/23/2017    Family History  Problem Relation Age of Onset   Cancer Grandchild        lymphoma    Social History   Socioeconomic History   Marital status: Married    Spouse name: Not on file   Number of children: Not on file   Years of education: Not on file   Highest education level: Not on file  Occupational History   Not on file  Tobacco Use   Smoking status: Never   Smokeless tobacco: Never  Vaping Use   Vaping Use: Never used  Substance and Sexual Activity   Alcohol use: Not Currently   Drug use: Yes    Types: Marijuana    Comment: edibles with THC   Sexual activity: Not Currently  Other Topics Concern   Not on file  Social History Narrative   Not on file  Social Determinants of Health   Financial Resource Strain: Not on file  Food Insecurity: No Food Insecurity (03/30/2022)   Hunger Vital Sign    Worried About Running Out of Food in the Last Year: Never true    Ran Out of Food in the Last Year: Never true  Transportation Needs: No Transportation Needs (03/30/2022)   PRAPARE - Hydrologist (Medical): No    Lack of Transportation (Non-Medical): No  Physical Activity: Not on file  Stress: Not on file  Social Connections: Not on file  Intimate Partner Violence: Not At Risk (03/30/2022)   Humiliation, Afraid, Rape, and Kick questionnaire    Fear of Current or Ex-Partner: No    Emotionally Abused: No    Physically Abused: No    Sexually Abused: No    Review of Systems: All negative except as stated above in HPI.  Physical Exam:Physical Exam Constitutional:      Appearance: Normal appearance.  HENT:     Head: Normocephalic and atraumatic.     Nose: Nose normal. No congestion.     Mouth/Throat:     Mouth: Mucous membranes are dry.  Eyes:     Extraocular Movements: Extraocular movements intact.     Conjunctiva/sclera: Conjunctivae normal.   Cardiovascular:     Rate and Rhythm: Normal rate and regular rhythm.  Pulmonary:     Effort: Pulmonary effort is normal. No respiratory distress.  Abdominal:     General: Abdomen is flat. Bowel sounds are normal. There is no distension.     Palpations: Abdomen is soft. There is no mass.     Tenderness: There is no abdominal tenderness. There is no guarding or rebound.     Hernia: No hernia is present.  Musculoskeletal:        General: No swelling. Normal range of motion.     Cervical back: Normal range of motion and neck supple.  Skin:    General: Skin is warm and dry.     Coloration: Skin is not jaundiced.  Neurological:     General: No focal deficit present.     Mental Status: He is alert and oriented to person, place, and time.  Psychiatric:        Mood and Affect: Mood normal.        Behavior: Behavior normal.        Thought Content: Thought content normal.        Judgment: Judgment normal.     Vital signs: Vitals:   04/19/22 2041 04/20/22 0413  BP: 128/67 119/65  Pulse: 85 (!) 59  Resp: 18 18  Temp: (!) 97.4 F (36.3 C) 97.7 F (36.5 C)  SpO2: 99% 99%   Last BM Date : 04/18/22    GI:  Lab Results: Recent Labs    04/19/22 0414 04/20/22 0332  WBC 14.5* 13.7*  HGB 6.8* 8.3*  HCT 23.2* 27.5*  PLT 347 339   BMET Recent Labs    04/20/22 0332  NA 129*  K 3.9  CL 96*  CO2 26  GLUCOSE 174*  BUN 11  CREATININE 0.47*  CALCIUM 8.7*   LFT No results for input(s): "PROT", "ALBUMIN", "AST", "ALT", "ALKPHOS", "BILITOT", "BILIDIR", "IBILI" in the last 72 hours. PT/INR No results for input(s): "LABPROT", "INR" in the last 72 hours.   Studies/Results: No results found.  Impression: Anemia, positive fecal occult -Hgb 8.3 -BUN 11, creatinine 0.47   Plan: No plans for EGD at this time. No overt  bleeding, hgb stable. If hgb is continuing to remain stable can restart Eliquis in 48hrs Continue supportive care Continue daily CBC and transfuse as needed  to maintain HGB > 7  [facility] will follow    LOS: 21 days   Hays Dunnigan Radford Pax  PA-C 04/20/2022, 8:28 AM  Contact #  [phone]

## 2022-04-20 NOTE — Progress Notes (Addendum)
Initial Nutrition Assessment  DOCUMENTATION CODES:   Not applicable  INTERVENTION:  - DYS 3 diet.  - Ensure Plus High Protein po once daily, each supplement provides 350 kcal and 20 grams of protein. - Monitor weight trends.    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  GOAL:   Patient will meet greater than or equal to 90% of their needs  MONITOR:   PO intake, Supplement acceptance, Diet advancement, Weight trends  REASON FOR ASSESSMENT:   LOS    ASSESSMENT:   77 year old male with history of metastatic renal cell carcinoma involving the brain/lung/bone, history of seizure who presented with weakness, confusion from home.  Patient a little confused at time of visit but able to answer most questions appropriately. Noted to have encephalopathy but improved from previously.  He reports a UBW of 230# and weight loss but did not feel he had lost any recently. Per EMR, patient weighed at 249# in November and weighed initially this admission at 225# but most recently at 210#. Patient noted to be -25L since admission which could be greatly affecting current weight status. From 249# to initial admission weight of 225# is a 24# or 10% weight loss in 3 months, which is significant for the time frame. Full extent of weight loss difficult to assess at this time due to negative fluid balance. However, patient eating well this admission so suspect most may be fluid loss. Will monitor weights closely.  Patient is noted to be eating well during admission. He is documented to be consuming 50-100% of meals with average of 76% over the past week. Patient endorses enjoying Ensure prior to admission and drinking once daily so agreeable to receive this admission.   Medications reviewed and include: Decadron, Iron, Oxycodone  Labs reviewed:   Na 129   NUTRITION - FOCUSED PHYSICAL EXAM:  Flowsheet Row Most Recent Value  Orbital Region Mild depletion  Upper Arm Region No depletion  Thoracic and Lumbar Region No depletion  Buccal Region No depletion  Temple Region Moderate depletion  Clavicle Bone Region Mild depletion  Clavicle and Acromion Bone Region Mild depletion  Scapular Bone Region Unable to assess  Dorsal Hand No depletion  Patellar Region Mild depletion  Anterior Thigh Region Mild depletion  Posterior Calf Region Mild depletion  Edema (RD Assessment) None  Hair Reviewed  Eyes Reviewed  Mouth Reviewed  Skin Reviewed  Nails Reviewed       Diet Order:   Diet Order             DIET DYS 3 Fluid consistency: Thin  Diet effective now                   EDUCATION NEEDS:  Education needs have been addressed  Skin:  Skin Assessment: Skin Integrity Issues: Skin Integrity Issues:: Stage II Stage II: Mid Sacrum  Last BM:  2/21  Height:  Ht Readings from Last 1 Encounters:  03/30/22 '5\' 11"'$  (1.803 m)   Weight:  Wt Readings from Last 1 Encounters:  04/16/22 95.2 kg    BMI:  Body mass index is 29.27 kg/m.  Estimated Nutritional Needs:  Kcal:  [id number] kcals Protein:  100-120 grams Fluid:  >/= 2.1L    Phillip [name] RD, LDN For contact information, refer to [facility].

## 2022-04-20 NOTE — Progress Notes (Signed)
IP PROGRESS NOTE  Subjective:   Phillip [name] is alert. No specific complaint.  He reports adequate pain control.  He is participating in physical therapy.   Objective: Vital signs in last 24 hours: Blood pressure 119/65, pulse (!) 59, temperature 97.7 F (36.5 C), resp. rate 18, height '5\' 11"'$  (1.803 m), weight 209 lb 14.1 oz (95.2 kg), SpO2 99 %.  Intake/Output from previous day: 02/22 0701 - 02/23 0700 In: 1088.7 [P.O.:600; I.V.:8.7; Blood:480] Out: 2700 [Urine:2700]  Physical Exam:  HEENT: No thrush Lungs: Clear anteriorly, no respiratory distress Cardiac: Regular rate and rhythm Abdomen: Soft and nontender GU: Foley in place, mild scrotal edema Extremities: SCDs in place, mild edema at the left lower leg Neurologic: Alert, moves extremities to commands, oriented to year, month, and place skin: Minimal erythema of the upper anterior chest  Portacath/PICC-without erythema  Lab Results: Recent Labs    04/19/22 0414 04/20/22 0332  WBC 14.5* 13.7*  HGB 6.8* 8.3*  HCT 23.2* 27.5*  PLT 347 339    BMET Recent Labs    04/20/22 0332  NA 129*  K 3.9  CL 96*  CO2 26  GLUCOSE 174*  BUN 11  CREATININE 0.47*  CALCIUM 8.7*    Lab Results  Component Value Date   CEA1 1.77 01/04/2016    Studies/Results: No results found.  Medications: I have reviewed the patient's current medications.  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, L4 nerve compression Biopsy of the L4 mass 11/18/2015 confirmed metastatic renal cell carcinoma, clear cell type CTs of the chest, abdomen, and pelvis 11/18/2015-right lower lobe nodule, expansile lytic lesion at the right 11th rib/costal vertebral junction, left renal mass, expansile lesion involving the L4 vertebra, lytic lesion at the left acetabulum, and a low-attenuation liver lesion Initiation of SRS to L4 12/02/2015, Completed 12/12/2015 Initiation of Pazopanib 12/30/2015 Pazopanib placed  on hold 02/06/2016 secondary to elevated liver enzymes Pazopanib resumed 03/07/2016 at a dose of 400 mg daily  Pazopanib discontinued 03/19/2016 secondary to elevated liver enzymes Restaging CTs 04/02/2016-stable left renal mass, decreased soft tissue component associated with the L4 metastasis, increased soft tissue component associated with the right 11th rib metastasis with increased T11 bony destruction, increased sclerosis at the left acetabulum lesion Cycle 1 nivolumab 04/12/2016 Cycle 2 nivolumab 04/26/2016 Cycle 3 nivolumab 05/11/2016 Cycle 4 nivolumab 05/24/2016 Cycle 5 nivolumab 06/08/2016 MRI lumbar spine 06/21/2016-unchanged tumor at L3, increased size of retroperitoneal lymph nodes compared to a CT from 04/02/2016 Cycle 6 nivolumab  06/22/2016 CTs chest, abdomen, and pelvis 07/04/2016-enlargement of the left renal mass, right adrenal nodule, left hilar and peritoneal lymph nodes, enlargement of left acetabular lesion. Stable lung nodules. Cycle 7 nivolumab 07/06/2016 Cycle 8 nivolumab 07/20/2016 Cycle 9 nivolumab 08/02/2016 Cycle 10 nivolumab 08/20/2016 Restaging CT 09/03/2016 evaluation with stable disease Cycle 11 nivolumab 09/05/2016 Cycle 12 nivolumab 09/19/2016 Cycle 13 nivolumab 10/03/2016 Cycle 14 nivolumab 10/17/2016 Cycle 15 nivolumab 10/31/2016 Cycle 16 nivolumab 11/14/2016 (changed to monthly schedule) Cycle 17 nivolumab 12/19/2016 CTs 01/21/2017-increased left renal mass, increased size of adrenal metastases, increased lytic bone lesions, increased left lung nodule, persistent tumor at L4 with probable epidural component Initiation of Cabozantinib 01/28/2017 Restaging CTs 05/30/2017- decreased size of left hilar mass, left renal mass, retroperitoneal adenopathy, and adrenal metastasis.  Healing bone lesions. Cabozantinib continued CTs 10/21/2017- interval enlargement left hilar lymph node; stable rib lesions; stable mass left renal cortex; stable mildly nodular  adrenal glands; stable lytic lesions within the pelvis and spine. Cabozantinib continued  CTs 02/21/2018- enlargement of an AP window lymph node.  Mildly enlarged left hilar lymph node is unchanged.  Primary renal cell carcinoma involving the upper pole of the left kidney appears similar.  Stable enlarged right periaortic lymph node adjacent to the renal vessels.  Stable multifocal bony metastatic disease. Cabozantinib continued CTs 07/29/2018- stable 15 mm AP window nodes; stable left hilar node; subcarinal node slightly larger; right periaortic node 16 mm, previously 12 mm; portacaval node 24 mm, previously 16 mm; 15 mm node superior to the pancreatic head has enlarged; interval increase nodularity of both adrenal glands; stable left kidney mass; multifocal bony metastatic disease not significantly changed. Cabozantinib continued CTs 11/06/2018- moderate improvement in thoracic adenopathy; minimal improvement abdominal adenopathy; left kidney upper pole mass and various lytic expansile bone lesions stable; mild increase in nodularity of left adrenal gland; right adrenal gland nodularity stable. 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  Peripheral enhancing masses consistent with metastases. No hemorrhage. MRI brain 03/11/2019-7 enhancing brain masses consistent with metastatic disease SRS to 7 brain lesions, treatment given 03/19/2019, 03/23/2019, and 03/25/2019 CTs 04/09/2019-decrease in left renal mass, bilateral adrenal nodules, AP window and porta hepatic adenopathy.  New 9 mm right lower lobe nodule.  Improved lytic lesion of the left acetabulum.  Other bone lesions are stable. Cabozantinib continued MRI brain 07/17/2019-resolution of 4 mm treated lesion in the right frontal cortex, 6 remaining treated lesions have decreased in size, new punctate metastasis in the superior right cerebellum SRS  to right cerebellar lesion 07/30/2019 CTs 08/17/2019-previously noted right lower lobe nodule resolved, stable left kidney mass, mixed lytic/sclerotic bone lesions in the thoracolumbar spine, right posterior ribs, and left acetabulum-unchanged, no evidence of progressive disease  Cabozantinib continued MRI brain 10/23/2019-stable to slight decrease in size of multiple enhancing intracranial lesions.  Slight increase in surrounding edema in the medial left frontal lobe.  No new lesions present. MRI brain 01/29/2020-multiple enhancing brain lesions, some hemorrhagic, some with mild enlargement-treatment effect? CTs 02/08/2020-no thoracic metastases, enlargement of the left renal mass, enlargement of lytic lesion at T9, other lytic lesions unchanged MRI brain 05/04/2020-no new lesions, slight enlargement of a right occipital lesion, other lesions are stable or decreased in size CTs 08/11/2020-enlargement of left kidney mass, new 1.6 cm segment 7 liver lesion, enlargement of a lytic lesion at T9, increased lysis of a sclerotic lesion at the right second rib, 0.7 cm endoluminal nodule at the bladder dome-enlarged Brain MRI 08/10/2020-slight increase in a 15 mm left frontal lobe lesion, new punctate focus in the right occipital cortex, other lesions stable or slightly decreased CTs 11/01/2020-increase in left suprahilar opacity, enlargement of previous liver metastases, several new subcentimeter lesions, increase in left renal mass, similar appearance of bone metastases, stable hyperdense/enhancing nodule in the left bladder Brain MRI 11/04/2020-no change in brain metastases Lenvatinib/pembrolizumab 11/30/2020 CTs 03/03/2021-stable left hilar lymph nodes; resolution of left upper lobe perihilar nodularity; unchanged for millimeter peripheral right upper lobe nodule; multiple lytic bone lesions appear stable; left kidney lesion decreased in size; bilateral adrenal nodules decreased in size; several liver lesion showed mild  increase in size; several new small lesions within the right hepatic lobe. Lenvatinib/pembrolizumab continued 03/07/2021 Brain MRI 03/10/2021-progression of a left frontal metastasis, tiny new metastasis in the right frontal lobe-referred for SBRT to the right frontal lesion and to [facility] to consider a LITT procedure for the progressive left frontal lesion SRS 04/06/2021, LITT procedure at  [facility] 04/18/2021-pathology metastatic renal cell carcinoma in background of necrosis/gliosis Lenvatinib on hold Pembrolizumab held 04/24/2021 Pembrolizumab 05/01/2021, lenvatinib remains on hold pending surgical evaluation Pembrolizumab 05/25/2021, lenvatinib resumed Pembrolizumab 06/15/2021 Lenvima resumed 06/21/2021 CTs 07/28/2021-continued regression left renal lesion.  Much improved appearance of the liver.  Continued regression of bilateral adrenal gland nodules.  Stable mixed lytic and sclerotic metastatic bone disease.  No new or progressive findings. Pembrolizumab 07/31/2021, lenvatinib continued Pembrolizumab held 08/21/2021 due to generalized weakness Pembrolizumab resumed 09/11/2021 MRI brain 10/19/2021-"new "focus of minimal enhancement of the right frontal lobe, larger punctate right cerebellar lesion, review of MRI found the right frontal lesion was present in 2021 and was treated with SRS CTs 11/12/2021-2 new right upper lung nodules, stable left renal lesion, enlarging lytic lesion in the left fourth rib Pembrolizumab and lenvatinib continued Brain 01/02/2022-new 4 mm lesion in the right parietal cortex the largest lesion in left frontal lobe stable in size with more solid  CTs chest, abdomen, and pelvis 01/08/2022-increased soft tissue component of a lytic left acetabular  metastasis and increased sclerotic component of the left fourth rib metastasis, decreased conspicuity of right upper lobe nodules, no change in renal mass Pembrolizumab and lenvatinib continued MRI brain 03/27/2022-minimal increase size of  a 3 mm right cerebellar lesion, increased size of enhancing mass at the left frontal lobe measuring 3 x 2.2 cm with adjacent gyriform enhancement and mild worsening of edema in the left frontal white matter CTs 03/30/2022-new pulmonary nodules, small gas collection in the gallbladder fossa, increased left acetabular metastasis   Pain secondary to #1-managed by Dr. Lovenia Shuck.  Improved Hypertension Elevated transaminases 02/06/2016- Pazopanib placed on hold Liver enzymes normal 03/07/2016 Port-A-Cath placement 05/16/2016 Malaise/anorexia 09/05/2016. Cortisol and testosterone levels low. Hydrocortisone and testosterone replacement initiated. Conjunctival/scleral erythema 09/19/2016-resolved with steroid eyedrops Proximal right leg weakness. 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 Hypercalcemia status post Zometa 01/23/2017-resolved Hypercalcemia 03/29/2022-Zometa Pruritic rash following IV contrast 10/21/2017; rash following IV contrast 02/21/2018 despite prednisone/Benadryl premedication Brief episodes of expressive aphasia October and December 2020 Vitamin B12 deficiency confirmed on lab 03/07/2021-started oral vitamin B12 replacement 03/28/2021 Admission 01/08/2022 with acute cholecystitis/sepsis, cholecystostomy tube Cholecystectomy 03/15/2022 14.  Admission 03/30/2022 with failure to thrive and fever 15.  Hematuria with urinary retention-likely secondary to bleeding from the renal mass 16.  Left lower extremity swelling to 04/06/2022-Doppler confirmed extensive left lower extremity DVT, apixaban 17.  Anemia secondary to chronic disease, metastatic renal cell carcinoma, bleeding, and phlebotomy-1 unit packed red blood cells 04/19/2022   Phillip [name] has metastatic renal cell carcinoma.  A brain MRI 03/27/2022 confirmed increase in the size of a dominant left frontal lobe metastasis with mild worsening of edema.  He had hypercalcemia and was treated with Zometa on 03/29/2022.   Lenvatinib and pembrolizumab were discontinued.   He was admitted with a fever and failure to thrive.  No source for infection was identified.  Hypercalcemia has resolved.  He has persistent leukocytosis, potentially related to infection, metastatic renal cell carcinoma, and Decadron.  His renal function improved with placement of the Foley catheter.  He is now alert and participating in physical therapy.  He has anemia secondary to chronic disease, metastatic renal cell carcinoma involving the bone marrow, bleeding, and phlebotomy.  He received 1 unit of packed red blood cells yesterday.  I have had multiple "goals of care "discussions with his wife.  The hope is to have him return home with home with his wife and began  palliative care/hospice.  He can be cared for in the home if he is ambulatory with assistance.  He is making progress with physical therapy and appears to be a candidate for a short skilled nursing facility stay to complete a course of physical therapy.  His insurance company has denied admission to a skilled nursing facility for physical therapy.  This is despite multiple appeals.  I am available to have further discussions with his wife regarding disposition plans.  Recommendations: Continue Decadron Continue OxyContin/oxycodone for pain Physical therapy, increase ambulation as tolerated Skilled nursing facility placement for physical therapy versus home with palliative care/home physical therapy 5.   Continue apixaban for the left leg DVT 6.   Please call oncology as needed, outpatient follow-up will be scheduled at the Cancer center     LOS: 21 days   Betsy Coder, MD   04/20/2022, 7:47 AM

## 2022-04-20 NOTE — Progress Notes (Signed)
PROGRESS NOTE  Phillip [name]  [id number] DOB: 1945/05/12 DOA: 03/30/2022 PCP: Charlane Ferretti, MD   Brief Narrative: Patient is a 77 year old male with history of metastatic renal cell carcinoma involving the brain/lung/bone, history of seizure who presented with weakness, confusion from home.  In the emergency department he was febrile, lab work showed leukocytosis, hypercalcemia.CT C/A/P showed small gas collection in the gallbladder fossa following cholecystectomy,potential abscess formation,expansion of LEFT acetabular metastasis into the operator fossa.  Patient was initially admitted with diagnosis of sepsis secondary to biliary infection.  General surgery, IR consulted, started on antibiotics.  Sepsis physiology has improved.  Hospital course complicated by acute confusion, urinary retension, acute left lower extremity DVT.  PT/OT recommending SNF but insurance declined.  Family has appealed , decision pending.  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  There was concern for biliary infection/possible abscess but no definitive source identified.  General surgery, IR were following.  Completed 10 days course of IV antibiotics, resolution of fever.  Negative cultures.  Still has mild leukocytosis most likely secondary to underlying cancer.  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  Mental status has  improved but remains confused to time, obeys commands.  Not agitated  Urine retention/AKI/hematuria: Improved renal function.  Failed voiding  trial, patient will be discharged with Foley  Acute left lower extremity DVT: Extensive DVT as per the Doppler including multiple veins.  Case was discussed with oncology, currently on Eliquis ( on hold now  for anemia/suspected GI bledd) .Edema has improved  Scrotal edema: Suspected to be related to volume overload.  Managed with scrotal elevation  Stage IV renal cancer with metastasis: Recent MRI showed brain metastasis.  On Decadron.  Continue Keppra for seizure control.  Oncology recommended palliative care/hospice approach  Normocytic anemia: Hemoglobin dropped to the range of 6 on 2/22 and was transfused unit of PRBC.  Hemoglobin currently in the range of 8.FOBT positive. No report of gross hematochezia or melena.  [facility] consulted, planning to observe/conservative management, Eliquis will be restarted 48 hours if hemoglobin remains stable.  Iron studies showed low iron, given IV iron  Hypokalemia/hypophosphatemia: Currently being monitored and supplemented as needed  Goals of care: Metastatic lung cancer along with multiple medical problems.  Very poor prognosis.  Has brain metastasis.  Follows with oncology, Dr. Learta Codding who is  recommending palliative  care referral when he returns home.  CODE STATUS is DNR.  Consulted in-house consultation for palliative care .  Plan is to discharge to rehab with ultimate goal of returning home and outpatient palliative care follow-up         Pressure Injury 04/13/22 Sacrum Mid Stage 2 -  Partial thickness loss of dermis presenting as a shallow open injury with a red, pink wound bed without slough. (Active)  04/13/22 0900  Location: Sacrum  Location Orientation: Mid  Staging: Stage 2 -  Partial thickness loss of dermis presenting as a shallow open injury with a red, pink wound bed without slough.  Wound Description (Comments):   Present on Admission: No  Dressing Type Foam - Lift dressing to assess site every shift 04/19/22 2046    DVT  prophylaxis:Place TED hose Start: 04/06/22 1453 SCDs Start: 03/30/22 1624     Code Status: DNR  Family Communication: Called and discussed with wife on phone on 2/22  Patient status:Inpatient  Patient is from :Home  Anticipated discharge to:SNF  Estimated DC date:not sure,waiting for auth   Consultants: Oncology,GI  Procedures: None  Antimicrobials:  Anti-infectives (From admission, onward)    Start     Dose/Rate Route Frequency Ordered Stop   04/06/22 1030  amoxicillin-clavulanate (AUGMENTIN) 875-125 MG per tablet 1 tablet        1 tablet Oral Every 12 hours 04/06/22 0935 04/08/22 2127   04/05/22 1800  ceFEPIme (MAXIPIME) 2 g in sodium chloride 0.9 % 100 mL IVPB  Status:  Discontinued        2 g 200 mL/hr over 30 Minutes Intravenous Every 12 hours 04/05/22 1121 04/06/22 0935   03/31/22 1600  vancomycin (VANCOREADY) IVPB 1250 mg/250 mL  Status:  Discontinued        1,250 mg 166.7 mL/hr over 90 Minutes Intravenous Every 24 hours 03/31/22 0843 04/02/22 0809   03/31/22 1300  vancomycin (VANCOREADY) IVPB 1500 mg/300 mL  Status:  Discontinued        1,500 mg 150 mL/hr over 120 Minutes Intravenous Every 24 hours 03/30/22 1447 03/31/22 0843   03/30/22 2200  metroNIDAZOLE (FLAGYL) IVPB 500 mg  Status:  Discontinued        500 mg 100 mL/hr over 60 Minutes Intravenous Every 12 hours 03/30/22 1623 03/30/22 1630   03/30/22 2200  metroNIDAZOLE (FLAGYL) IVPB 500 mg  Status:  Discontinued        500 mg 100 mL/hr over 60 Minutes Intravenous Every 12 hours 03/30/22 1446 04/06/22 0935   03/30/22 2100  ceFEPIme (MAXIPIME) 2 g in sodium chloride 0.9 % 100 mL IVPB  Status:  Discontinued        2 g 200 mL/hr over 30 Minutes Intravenous Every 8 hours 03/30/22 1440 04/05/22 1121   03/30/22 1045  vancomycin (VANCOREADY) IVPB 2000 mg/400 mL        2,000 mg 200 mL/hr over 120 Minutes Intravenous  Once 03/30/22 1038 03/30/22 1529   03/30/22 1030  ceFEPIme (MAXIPIME) 2 g in sodium chloride 0.9 %  100 mL IVPB        2 g 200 mL/hr over 30 Minutes Intravenous  Once 03/30/22 1029 03/30/22 1250   03/30/22 1030  metroNIDAZOLE (FLAGYL) IVPB 500 mg        500 mg 100 mL/hr over 60 Minutes Intravenous  Once 03/30/22 1029 03/30/22 1400   03/30/22 1030  vancomycin (VANCOCIN) IVPB 1000 mg/200 mL premix  Status:  Discontinued        1,000 mg 200 mL/hr over 60 Minutes Intravenous  Once 03/30/22 1029 03/30/22 1038       Subjective: Patient seen and examined at bedside today.  Hemodynamically stable.Lying on bed ,appears comfortable.No new complains.  Ate his breakfast.  Denies any abdomen pain, hematochezia or melena  Objective: Vitals:   04/19/22 1240 04/19/22 1340 04/19/22 2041 04/20/22 0413  BP: (!) 105/55 130/61 128/67 119/65  Pulse: 83 75 85 (!) 59  Resp: '16 16 18 18  '$ Temp: (!) 97.4 F (36.3 C) 97.8 F (36.6 C) (!) 97.4 F (36.3 C) 97.7 F (36.5 C)  TempSrc: Oral Oral Oral   SpO2: 98% 97% 99% 99%  Weight:  Height:        Intake/Output Summary (Last 24 hours) at 04/20/2022 1127 Last data filed at 04/20/2022 0847 Gross per 24 hour  Intake 1208.67 ml  Output 1500 ml  Net -291.33 ml   Filed Weights   03/30/22 1029 04/16/22 0500  Weight: 102.1 kg 95.2 kg    Examination:   General exam: Overall comfortable, not in distress, deconditioned, obese HEENT: PERRL Respiratory system:  no wheezes or crackles  Cardiovascular system: S1 & S2 heard, RRR.  Gastrointestinal system: Abdomen is nondistended, soft and nontender. Central nervous system: Alert and awake, obese months, oriented to place  Extremities: No edema, no clubbing ,no cyanosis Skin: No rashes, no ulcers,no icterus    Data Reviewed: I have personally reviewed following labs and imaging studies  CBC: Recent Labs  Lab 04/19/22 0414 04/20/22 0332  WBC 14.5* 13.7*  HGB 6.8* 8.3*  HCT 23.2* 27.5*  MCV 83.2 83.6  PLT 347 [id number]   Basic Metabolic Panel: Recent Labs  Lab 04/20/22 0332  NA 129*  K 3.9  CL  96*  CO2 26  GLUCOSE 174*  BUN 11  CREATININE 0.47*  CALCIUM 8.7*     No results found for this or any previous visit (from the past 240 hour(s)).   Radiology Studies: No results found.  Scheduled Meds:  Chlorhexidine Gluconate Cloth  6 each Topical Daily   dexamethasone  4 mg Oral Daily   [START ON 04/22/2022] ferrous sulfate  325 mg Oral Q breakfast   levETIRAcetam  250 mg Oral BID   mouth rinse  15 mL Mouth Rinse 4 times per day   oxyCODONE  10-20 mg Oral Daily   oxyCODONE  20 mg Oral QHS   testosterone  5 g Transdermal Daily   Continuous Infusions:  ferric gluconate (FERRLECIT) IVPB 250 mg (04/20/22 0946)     LOS: 21 days   Shelly Coss, MD [facility] P2/23/2024, 11:27 AM

## 2022-04-20 NOTE — Progress Notes (Signed)
Palliative Medicine Progress Note   Patient Name: Phillip [name]       Date: 04/20/2022 DOB: 23-Mar-1945  Age: 77 y.o. MRN#: [id number] Attending Physician: Shelly Coss, MD Primary Care Physician: Charlane Ferretti, MD Admit Date: 03/30/2022  Reason for Consultation/Follow-up: {Reason for Consult:23484}  HPI/Patient Profile: 77 y.o. 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  Patient was initially admitted with a diagnosis of sepsis secondary to biliary infection.  Hospital course has been complicated by confusion, urinary retention, and acute left lower extremity DVT. PT/OT recommending SNF/rehab but insurance has declined.   Palliative Medicine has been consulted for goals of care.   Subjective: Chart reviewed. I visited patient and his wife at bedside. He is out of bed to the recliner with no acute complaints.    Objective:  Physical Exam Constitutional:      General: He is not in acute distress.    Comments: Chronically ill-appearing  Pulmonary:     Effort: Pulmonary effort is normal.  Neurological:     Mental Status: He is alert.     Motor: Weakness present.             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, lung,  bone   Port catheter in place   HTN (hypertension)   Focal seizures ([name])   Seizure disorder ([name])   Acute metabolic encephalopathy   Hypercalcemia   Normocytic anemia   Acute DVT (deep venous thrombosis) ([place])    Recommendations/Plan: Continue current supportive interventions Goal of care - rehab to improve functional status with ultimate goal of returning home Outpatient palliative referral   Code Status: DNR  Prognosis:  Unable to determine  Discharge Planning: To Be Determined   Care plan was discussed with ***  Thank you for allowing the Palliative Medicine Team to assist in the care of this patient.   ***   Lavena Bullion, NP   Please contact Palliative Medicine Team phone at [phone] for questions and concerns.  For individual providers, please see AMION.

## 2022-04-20 NOTE — Progress Notes (Signed)
Occupational Therapy Treatment Patient Details Name: Phillip [name] MRN: [id number] DOB: [birth year] Today's Date: 04/20/2022   History of present illness Pt is a 77 year old male admitted for Sepsis secondary to biliary infection and with history of S4 RCC w/ liver, pulm and brain mets.  PMHx including but not limited to anemia, CKD, renal CA, HTN, seizure, cholecystitis in 11/23 with cholecystostomy tube placed - and lap cholecystectomy 03/15/22.  A brain MRI this week confirmed increase in the size of a dominant left frontal lobe metastasis with mild worsening of edema.   OT comments  Treatment focused on functional mobility in order to progress to OOB ADLs and standing needed for ADLs. Patient max assist x 1 to transfer to edge of bed. Worked on standing initially with stedy to use bar to pull up on x 2 but height of bar didn't help him stand erect. Stood with walker and able to use upper body strength to take 7 steps to recliner with a decent control into sitting. He fatigues quickly. He required tactile cues to improve posture in standing. He has forward head and is keeping his chin down. Encouraged patient to sit with his legs down x 30 minutes and kick legs and pump ankles to work on LE strength. Patient making progress and demonstrates good rehab potential to improve functional abilities to reduce caregiver burden.    Recommendations for follow up therapy are one component of a multi-disciplinary discharge planning process, led by the attending physician.  Recommendations may be updated based on patient status, additional functional criteria and insurance authorization.    Follow Up Recommendations  Skilled nursing-short term rehab (<3 hours/day)     Assistance Recommended at Discharge Frequent or constant Supervision/Assistance  Patient can return home with the following  Assistance with cooking/housework;Direct supervision/assist for medications management;Direct supervision/assist for  financial management;Assist for transportation;Help with stairs or ramp for entrance;A lot of help with bathing/dressing/bathroom;Two people to help with walking and/or transfers   [place] [facility] bed;Other (comment) (hoyer lift)    Recommendations for Other Services      Precautions / Restrictions Precautions Precautions: Fall Restrictions Weight Bearing Restrictions: No       Mobility Bed Mobility Overal bed mobility: Needs Assistance       Supine to sit: Max assist, HOB elevated     General bed mobility comments: Max x 1 to transfer to edge of bed. Cues to get him to attempt to move legs to edge of bed initially.    Transfers Overall transfer level: Needs assistance Equipment used: Rolling walker (2 wheels) Transfers: Sit to/from Stand, Bed to chair/wheelchair/BSC Sit to Stand: Min assist, +2 safety/equipment, From elevated surface     Step pivot transfers: Min assist, +2 safety/equipment     General transfer comment: Min x 2 to stand with use of stedy bar x 2 from elevated surface. Approx 5-10 seconds in standing before he needed to rest. Stedy bar to high for him to use effectively in standing. Tactile cues at chest, back and forehead to improve posture. Stood again with lowered walker and patient able to take 7 steps to recliner with only min assist (x 2 for safety) but strong cues to use UE strength.     Balance Overall balance assessment: Needs assistance Sitting-balance support: No upper extremity supported, Feet supported Sitting balance-Leahy Scale: Fair     Standing balance support: During functional activity, Reliant on assistive device for balance Standing balance-Leahy Scale: Poor  Cognition Arousal/Alertness: Awake/alert Behavior During Therapy: WFL for tasks assessed/performed Overall Cognitive Status: Within Functional Limits for tasks assessed                                  General Comments: Patient pleasant. Able to follow commands. HOH at times.                   Pertinent Vitals/ Pain       Pain Assessment Pain Assessment: Faces Faces Pain Scale: Hurts little more Pain Location: buttucks Pain Descriptors / Indicators: Sore, Discomfort Pain Intervention(s): Monitored during session   Frequency  Min 2X/week        Progress Toward Goals  OT Goals(current goals can now be found in the care plan section)  Progress towards OT goals: Progressing toward goals  Acute Rehab OT Goals Patient Stated Goal: wife's goals is for patient to get stronger so he can DC home OT Goal Formulation: With patient/family Time For Goal Achievement: 05/03/22 Potential to Achieve Goals: Good  Plan Discharge plan remains appropriate    Co-evaluation                 AM-PAC OT "6 Clicks" Daily Activity     Outcome Measure   Help from another person eating meals?: A Little Help from another person taking care of personal grooming?: A Little Help from another person toileting, which includes using toliet, bedpan, or urinal?: Total Help from another person bathing (including washing, rinsing, drying)?: A Lot Help from another person to put on and taking off regular upper body clothing?: A Little Help from another person to put on and taking off regular lower body clothing?: A Lot 6 Click Score: 14    End of Session Equipment Utilized During Treatment: Rolling walker (2 wheels)  OT Visit Diagnosis: Unsteadiness on feet (R26.81);Other abnormalities of gait and mobility (R26.89);Muscle weakness (generalized) (M62.81);Other symptoms and signs involving cognitive function;Pain   Activity Tolerance Patient tolerated treatment well   Patient Left in chair;with call bell/phone within reach;with chair alarm set;with family/visitor present   Nurse Communication Mobility status        Time: [id number] OT Time Calculation (min): 22 min  Charges: OT  General Charges $OT Visit: 1 Visit OT Treatments $Therapeutic Activity: 8-22 mins  Gustavo Lah, OTR/L [place]  Office [phone]   Lenward Chancellor 04/20/2022, 4:20 PM

## 2022-04-20 NOTE — TOC Progression Note (Signed)
Transition of Care [facility]) - Progression Note    Patient Details  Name: [name] MRN: [id number] Date of Birth: [birth year]  Transition of Care [facility]) CM/SW [place], LCSW Phone Number: 04/20/2022, 12:58 PM  Clinical Narrative:    TOC CSW spoke with pt's wife to discuss her plans for pt, as pt is stable for d/c. pt's wife reported waiting on the document from [employer] to appeal the maximus denial for SNF placement. She stated she was told the paperwork was sent in the mail and she has not received it yet to start the fourth appeal. CSW inquired about plans if the denial was upheld. Per pt's wife, she plans to get funds to send pt's to SNF. She reported she needs him to be able to stand and pivot. TOC to follow.     Expected Discharge Plan: [place] Barriers to Discharge: Continued Medical Work up  Expected Discharge Plan and [place] arrangements for the past 2 months: Single Family Home Expected Discharge Date: 04/11/22                                     Social Determinants of Health (SDOH) Interventions [place]: No Food Insecurity (03/30/2022)  Housing: Low Risk  (03/30/2022)  Transportation Needs: No Transportation Needs (03/30/2022)  Utilities: Not At Risk (03/30/2022)  Tobacco Use: Low Risk  (03/30/2022)    Readmission Risk Interventions    01/09/2022    1:14 PM  Readmission Risk Prevention Plan  Transportation Screening Complete  PCP or Specialist Appt within 3-5 Days Complete  Social Work Consult for [place] Planning/Counseling Complete  Palliative Care Screening Complete  Medication Review Press photographer) Complete

## 2022-04-21 DIAGNOSIS — R6510 Systemic inflammatory response syndrome (SIRS) of non-infectious origin without acute organ dysfunction: Secondary | ICD-10-CM | POA: Diagnosis not present

## 2022-04-21 LAB — BASIC METABOLIC PANEL
Anion gap: 7 (ref 5–15)
BUN: 12 mg/dL (ref 8–23)
CO2: 26 mmol/L (ref 22–32)
Calcium: 8.9 mg/dL (ref 8.9–10.3)
Chloride: 99 mmol/L (ref 98–111)
Creatinine, Ser: 0.42 mg/dL — ABNORMAL LOW (ref 0.61–1.24)
GFR, Estimated: >60 mL/min (ref >60)
Glucose, Bld: 164 mg/dL — ABNORMAL HIGH (ref 70–99)
Potassium: 3.9 mmol/L (ref 3.5–5.1)
Sodium: 132 mmol/L — ABNORMAL LOW (ref 135–145)

## 2022-04-21 LAB — CBC
HCT: 29 % — ABNORMAL LOW (ref 39.0–52.0)
Hemoglobin: 8.8 g/dL — ABNORMAL LOW (ref 13.0–17.0)
MCH: 25.7 pg — ABNORMAL LOW (ref 26.0–34.0)
MCHC: 30.3 g/dL (ref 30.0–36.0)
MCV: 84.5 fL (ref 80.0–100.0)
Platelets: 345 10*3/uL (ref 150–400)
RBC: 3.43 MIL/uL — ABNORMAL LOW (ref 4.22–5.81)
RDW: 17.2 % — ABNORMAL HIGH (ref 11.5–15.5)
WBC: 14.8 10*3/uL — ABNORMAL HIGH (ref 4.0–10.5)
nRBC: 0 % (ref 0.0–0.2)

## 2022-04-21 NOTE — Progress Notes (Signed)
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  In the emergency department he was febrile, lab work showed leukocytosis, hypercalcemia.CT C/A/P showed small gas collection in the gallbladder fossa following cholecystectomy,potential abscess formation,expansion of LEFT acetabular metastasis into the operator fossa.  Patient was initially admitted with diagnosis of sepsis secondary to biliary infection.  General surgery, IR consulted, started on antibiotics.  Sepsis physiology has improved.  Hospital course complicated by acute confusion, urinary retension, acute left lower extremity DVT.  PT/OT recommending SNF but insurance declined.  Family has appealed , decision pending.  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  There was concern for biliary infection/possible abscess but no definitive source identified.  General surgery, IR were following.  Completed 10 days course of IV antibiotics, resolution of fever.  Negative cultures.  Still has mild leukocytosis most likely secondary to underlying cancer.  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  Mental status has  improved but remains confused to time, obeys commands.  Not agitated  Urine retention/AKI/hematuria: Improved renal function.  Failed voiding  trial, patient will be discharged with Foley  Acute left lower extremity DVT: Extensive DVT as per the Doppler including multiple veins.  Case was discussed with oncology, currently on Eliquis ( on hold now  for anemia/suspected GI bledd) .Edema has improved  Scrotal edema: Suspected to be related to volume overload.  Managed with scrotal elevation  Stage IV renal cancer with metastasis: Recent MRI showed brain metastasis.  On Decadron.  Continue Keppra for seizure control.  Oncology recommended palliative care/hospice approach  Normocytic anemia: Hemoglobin dropped to the range of 6 on 2/22 and was transfused unit of PRBC.  Hemoglobin currently in the range of 8.FOBT positive. No report of gross hematochezia or melena.  [facility] consulted, planning to observe/conservative management, Eliquis will be restarted tomorrow if hemoglobin remains stable.  Iron studies showed low iron, given IV iron  Hypokalemia/hypophosphatemia: Currently being monitored and supplemented as needed  Goals of care: Metastatic lung cancer along with multiple medical problems.  Very poor prognosis.  Has brain metastasis.  Follows with oncology, Dr. Learta Codding who is  recommending palliative  care referral when he returns home.  CODE STATUS is DNR.  Consulted in-house consultation for palliative care .  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 (Active)  04/13/22 0900  Location: Sacrum  Location Orientation: Mid  Staging: Stage 2 -  Partial thickness loss of dermis presenting as a shallow open injury with a  red, pink wound bed without  slough.  Wound Description (Comments):   Present on Admission: No  Dressing Type Foam - Lift dressing to assess site every shift 04/20/22 2000    DVT prophylaxis:Place TED hose Start: 04/06/22 1453 SCDs Start: 03/30/22 1624     Code Status: DNR  Family Communication: Called and discussed with wife on phone on 2/22  Patient status:Inpatient  Patient is from :Home  Anticipated discharge to:SNF  Estimated DC date:not sure,waiting for auth   Consultants: Oncology,GI  Procedures: None  Antimicrobials:  Anti-infectives (From admission, onward)    Start     Dose/Rate Route Frequency Ordered Stop   04/06/22 1030  amoxicillin-clavulanate (AUGMENTIN) 875-125 MG per tablet 1 tablet        1 tablet Oral Every 12 hours 04/06/22 0935 04/08/22 2127   04/05/22 1800  ceFEPIme (MAXIPIME) 2 g in sodium chloride 0.9 % 100 mL IVPB  Status:  Discontinued        2 g 200 mL/hr over 30 Minutes Intravenous Every 12 hours 04/05/22 1121 04/06/22 0935   03/31/22 1600  vancomycin (VANCOREADY) IVPB 1250 mg/250 mL  Status:  Discontinued        1,250 mg 166.7 mL/hr over 90 Minutes Intravenous Every 24 hours 03/31/22 0843 04/02/22 0809   03/31/22 1300  vancomycin (VANCOREADY) IVPB 1500 mg/300 mL  Status:  Discontinued        1,500 mg 150 mL/hr over 120 Minutes Intravenous Every 24 hours 03/30/22 1447 03/31/22 0843   03/30/22 2200  metroNIDAZOLE (FLAGYL) IVPB 500 mg  Status:  Discontinued        500 mg 100 mL/hr over 60 Minutes Intravenous Every 12 hours 03/30/22 1623 03/30/22 1630   03/30/22 2200  metroNIDAZOLE (FLAGYL) IVPB 500 mg  Status:  Discontinued        500 mg 100 mL/hr over 60 Minutes Intravenous Every 12 hours 03/30/22 1446 04/06/22 0935   03/30/22 2100  ceFEPIme (MAXIPIME) 2 g in sodium chloride 0.9 % 100 mL IVPB  Status:  Discontinued        2 g 200 mL/hr over 30 Minutes Intravenous Every 8 hours 03/30/22 1440 04/05/22 1121   03/30/22 1045  vancomycin (VANCOREADY) IVPB 2000 mg/400 mL         2,000 mg 200 mL/hr over 120 Minutes Intravenous  Once 03/30/22 1038 03/30/22 1529   03/30/22 1030  ceFEPIme (MAXIPIME) 2 g in sodium chloride 0.9 % 100 mL IVPB        2 g 200 mL/hr over 30 Minutes Intravenous  Once 03/30/22 1029 03/30/22 1250   03/30/22 1030  metroNIDAZOLE (FLAGYL) IVPB 500 mg        500 mg 100 mL/hr over 60 Minutes Intravenous  Once 03/30/22 1029 03/30/22 1400   03/30/22 1030  vancomycin (VANCOCIN) IVPB 1000 mg/200 mL premix  Status:  Discontinued        1,000 mg 200 mL/hr over 60 Minutes Intravenous  Once 03/30/22 1029 03/30/22 1038       Subjective: Patient seen and examined at bedside today.  Hemodynamically stable.  Overall comfortable.  Complains of pain on bilateral knees.  Not in any current distress.  No new episodes of hematochezia or melena   Objective: Vitals:   04/20/22 0413 04/20/22 1230 04/20/22 2100 04/21/22 0425  BP: 119/65 (!) 116/58 112/78 123/60  Pulse: (!) 59 85 69 64  Resp: '18 18 19 18  '$ Temp: 97.7 F (36.5 C) 99.1 F (37.3 C) 98.5 F (36.9 C)  97.6 F (36.4 C)  TempSrc:  Oral Oral Oral  SpO2: 99% 97% 96% 97%  Weight:      Height:        Intake/Output Summary (Last 24 hours) at 04/21/2022 1143 Last data filed at 04/21/2022 0900 Gross per 24 hour  Intake 1230 ml  Output 2300 ml  Net -1070 ml   Filed Weights   03/30/22 1029 04/16/22 0500  Weight: 102.1 kg 95.2 kg    Examination:  General exam: Overall comfortable, not in distress, deconditioned, obese HEENT: PERRL Respiratory system:  no wheezes or crackles  Cardiovascular system: S1 & S2 heard, RRR.  Chemo-Port on the right chest Gastrointestinal system: Abdomen is nondistended, soft and nontender. Central nervous system: Alert and oriented Extremities: No edema, no clubbing ,no cyanosis Skin: No rashes, no ulcers,no icterus   GU: Foley  Data Reviewed: I have personally reviewed following labs and imaging studies  CBC: Recent Labs  Lab 04/19/22 0414 04/20/22 0332  04/21/22 0310  WBC 14.5* 13.7* 14.8*  HGB 6.8* 8.3* 8.8*  HCT 23.2* 27.5* 29.0*  MCV 83.2 83.6 84.5  PLT 347 339 [id number]   Basic Metabolic Panel: Recent Labs  Lab 04/20/22 0332 04/21/22 0310  NA 129* 132*  K 3.9 3.9  CL 96* 99  CO2 26 26  GLUCOSE 174* 164*  BUN 11 12  CREATININE 0.47* 0.42*  CALCIUM 8.7* 8.9     No results found for this or any previous visit (from the past 240 hour(s)).   Radiology Studies: No results found.  Scheduled Meds:  Chlorhexidine Gluconate Cloth  6 each Topical Daily   dexamethasone  4 mg Oral Daily   feeding supplement  237 mL Oral Q24H   [START ON 04/22/2022] ferrous sulfate  325 mg Oral Q breakfast   levETIRAcetam  250 mg Oral BID   mouth rinse  15 mL Mouth Rinse 4 times per day   oxyCODONE  10-20 mg Oral Daily   oxyCODONE  20 mg Oral QHS   testosterone  5 g Transdermal Daily   Continuous Infusions:  ferric gluconate (FERRLECIT) IVPB 250 mg (04/21/22 1132)     LOS: 22 days   Shelly Coss, MD [facility] P2/24/2024, 11:43 AM

## 2022-04-21 NOTE — Plan of Care (Signed)

## 2022-04-22 DIAGNOSIS — R6510 Systemic inflammatory response syndrome (SIRS) of non-infectious origin without acute organ dysfunction: Secondary | ICD-10-CM | POA: Diagnosis not present

## 2022-04-22 LAB — CBC
HCT: 28.6 % — ABNORMAL LOW (ref 39.0–52.0)
Hemoglobin: 8.6 g/dL — ABNORMAL LOW (ref 13.0–17.0)
MCH: 25.4 pg — ABNORMAL LOW (ref 26.0–34.0)
MCHC: 30.1 g/dL (ref 30.0–36.0)
MCV: 84.6 fL (ref 80.0–100.0)
Platelets: 311 10*3/uL (ref 150–400)
RBC: 3.38 MIL/uL — ABNORMAL LOW (ref 4.22–5.81)
RDW: 17.4 % — ABNORMAL HIGH (ref 11.5–15.5)
WBC: 14.5 10*3/uL — ABNORMAL HIGH (ref 4.0–10.5)
nRBC: 0 % (ref 0.0–0.2)

## 2022-04-22 MED ORDER — APIXABAN 5 MG PO TABS
5.0000 mg | ORAL_TABLET | Freq: Two times a day (BID) | ORAL | Status: DC
  Administered 2022-04-22 – 2022-04-30 (×17): 5 mg via ORAL
  Filled 2022-04-22 (×17): qty 1

## 2022-04-22 NOTE — Progress Notes (Signed)
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  In the emergency department he was febrile, lab work showed leukocytosis, hypercalcemia.CT C/A/P showed small gas collection in the gallbladder fossa following cholecystectomy,potential abscess formation,expansion of LEFT acetabular metastasis into the operator fossa.  Patient was initially admitted with diagnosis of sepsis secondary to biliary infection.  General surgery, IR consulted, started on antibiotics.  Sepsis physiology has improved.  Hospital course complicated by acute confusion, urinary retension, acute left lower extremity DVT.  PT/OT recommending SNF but insurance declined.  Family has appealed , decision pending.  TOC following.  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  There was concern for biliary infection/possible abscess but no definitive source identified.  General surgery, IR were following.  Completed 10 days course of IV antibiotics, resolution of fever.  Negative cultures.  Still has mild leukocytosis most likely secondary to underlying cancer.  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  Mental status has  improved but remains confused to time, obeys commands.  Not agitated  Urine  retention/AKI/hematuria: Improved renal function.  Failed voiding trial, patient will be discharged with Foley  Acute left lower extremity DVT: Extensive DVT as per the Doppler including multiple veins.  Case was discussed with oncology, currently on Eliquis .Edema has improved  Scrotal edema: Suspected to be related to volume overload.  Managed with scrotal elevation  Stage IV renal cancer with metastasis: Recent MRI showed brain metastasis.  On Decadron.  Continue Keppra for seizure control.  Oncology recommended palliative care/hospice approach  Normocytic anemia: Hemoglobin dropped to the range of 6 on 2/22 and was transfused unit of PRBC.  Hemoglobin currently in the range of 8.FOBT positive. No report of gross hematochezia or melena.  [facility] consulted, recommended to observe/conservative management. Eliquis  restarted . Iron studies showed low iron, given IV iron  Hypokalemia/hypophosphatemia: Currently being monitored and supplemented as needed  Goals of care: Metastatic lung cancer along with multiple medical problems.  Very poor prognosis.  Has brain metastasis.  Follows with oncology, Dr. Learta Codding who is  recommending palliative  care referral when he returns home.  CODE STATUS is DNR.  Consulted in-house consultation for palliative care .  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 (Active)  04/13/22 0900  Location: Sacrum  Location Orientation: Mid  Staging: Stage 2 -  Partial thickness loss of dermis presenting as a shallow open injury with a red, pink wound bed without slough.  Wound  Description (Comments):   Present on Admission: No  Dressing Type Foam - Lift dressing to assess site every shift 04/21/22 2100    DVT prophylaxis:Place TED hose Start: 04/06/22 1453 SCDs Start: 03/30/22 1624 apixaban (ELIQUIS) tablet 5 mg     Code Status: DNR  Family Communication: Called and discussed with wife on phone on 2/22  Patient status:Inpatient  Patient is from :Home  Anticipated discharge to:SNF  Estimated DC date:not sure,waiting for auth   Consultants: Oncology,GI  Procedures: None  Antimicrobials:  Anti-infectives (From admission, onward)    Start     Dose/Rate Route Frequency Ordered Stop   04/06/22 1030  amoxicillin-clavulanate (AUGMENTIN) 875-125 MG per tablet 1 tablet        1 tablet Oral Every 12 hours 04/06/22 0935 04/08/22 2127   04/05/22 1800  ceFEPIme (MAXIPIME) 2 g in sodium chloride 0.9 % 100 mL IVPB  Status:  Discontinued        2 g 200 mL/hr over 30 Minutes Intravenous Every 12 hours 04/05/22 1121 04/06/22 0935   03/31/22 1600  vancomycin (VANCOREADY) IVPB 1250 mg/250 mL  Status:  Discontinued        1,250 mg 166.7 mL/hr over 90 Minutes Intravenous Every 24 hours 03/31/22 0843 04/02/22 0809   03/31/22 1300  vancomycin (VANCOREADY) IVPB 1500 mg/300 mL  Status:  Discontinued        1,500 mg 150 mL/hr over 120 Minutes Intravenous Every 24 hours 03/30/22 1447 03/31/22 0843   03/30/22 2200  metroNIDAZOLE (FLAGYL) IVPB 500 mg  Status:  Discontinued        500 mg 100 mL/hr over 60 Minutes Intravenous Every 12 hours 03/30/22 1623 03/30/22 1630   03/30/22 2200  metroNIDAZOLE (FLAGYL) IVPB 500 mg  Status:  Discontinued        500 mg 100 mL/hr over 60 Minutes Intravenous Every 12 hours 03/30/22 1446 04/06/22 0935   03/30/22 2100  ceFEPIme (MAXIPIME) 2 g in sodium chloride 0.9 % 100 mL IVPB  Status:  Discontinued        2 g 200 mL/hr over 30 Minutes Intravenous Every 8 hours 03/30/22 1440 04/05/22 1121   03/30/22 1045  vancomycin (VANCOREADY) IVPB  2000 mg/400 mL        2,000 mg 200 mL/hr over 120 Minutes Intravenous  Once 03/30/22 1038 03/30/22 1529   03/30/22 1030  ceFEPIme (MAXIPIME) 2 g in sodium chloride 0.9 % 100 mL IVPB        2 g 200 mL/hr over 30 Minutes Intravenous  Once 03/30/22 1029 03/30/22 1250   03/30/22 1030  metroNIDAZOLE (FLAGYL) IVPB 500 mg        500 mg 100 mL/hr over 60 Minutes Intravenous  Once 03/30/22 1029 03/30/22 1400   03/30/22 1030  vancomycin (VANCOCIN) IVPB 1000 mg/200 mL premix  Status:  Discontinued        1,000 mg 200 mL/hr over 60 Minutes Intravenous  Once 03/30/22 1029 03/30/22 1038       Subjective: Patient seen and examined at bedside today.  Hemodynamically stable.  Just ate his breakfast.  Comfortable.  Alert and awake, oriented to place.  Denies any abdominal pain, hematochezia or melena   Objective: Vitals:   04/21/22 0425 04/21/22 1200 04/21/22 2202 04/22/22 0425  BP: 123/60 (!) 131/58 (!) 123/57 117/65  Pulse: 64 88 72 72  Resp: '18 17 17   '$ Temp: 97.6 F (36.4 C) 98 F (36.7 C) 97.7 F (36.5 C) 98 F (36.7 C)  TempSrc:  Oral Axillary Oral Oral  SpO2: 97% 96% 96% 97%  Weight:      Height:        Intake/Output Summary (Last 24 hours) at 04/22/2022 1054 Last data filed at 04/22/2022 0900 Gross per 24 hour  Intake 960 ml  Output 2900 ml  Net -1940 ml   Filed Weights   03/30/22 1029 04/16/22 0500  Weight: 102.1 kg 95.2 kg    Examination:  General exam: Overall comfortable, not in distress, conditioned, weak appearing, obese HEENT: PERRL Respiratory system:  no wheezes or crackles  Cardiovascular system: S1 & S2 heard, RRR.  Chemo port  on the right chest Gastrointestinal system: Abdomen is distended, soft and nontender. Central nervous system: Alert and oriented Extremities: No edema, no clubbing ,no cyanosis Skin: No rashes, no ulcers,no icterus  GU: Foley   Data Reviewed: I have personally reviewed following labs and imaging studies  CBC: Recent Labs  Lab  04/19/22 0414 04/20/22 0332 04/21/22 0310 04/22/22 0309  WBC 14.5* 13.7* 14.8* 14.5*  HGB 6.8* 8.3* 8.8* 8.6*  HCT 23.2* 27.5* 29.0* 28.6*  MCV 83.2 83.6 84.5 84.6  PLT 347 339 345 [id number]   Basic Metabolic Panel: Recent Labs  Lab 04/20/22 0332 04/21/22 0310  NA 129* 132*  K 3.9 3.9  CL 96* 99  CO2 26 26  GLUCOSE 174* 164*  BUN 11 12  CREATININE 0.47* 0.42*  CALCIUM 8.7* 8.9     No results found for this or any previous visit (from the past 240 hour(s)).   Radiology Studies: No results found.  Scheduled Meds:  apixaban  5 mg Oral BID   Chlorhexidine Gluconate Cloth  6 each Topical Daily   dexamethasone  4 mg Oral Daily   feeding supplement  237 mL Oral Q24H   ferrous sulfate  325 mg Oral Q breakfast   levETIRAcetam  250 mg Oral BID   mouth rinse  15 mL Mouth Rinse 4 times per day   oxyCODONE  10-20 mg Oral Daily   oxyCODONE  20 mg Oral QHS   testosterone  5 g Transdermal Daily   Continuous Infusions:     LOS: 23 days   Shelly Coss, MD [facility] P2/25/2024, 10:54 AM

## 2022-04-22 NOTE — Progress Notes (Signed)
Physical Therapy Treatment Patient Details Name: Phillip [name] MRN: [id number] DOB: [birth year] Today's Date: 04/22/2022   History of Present Illness Pt is a 77 year old male admitted for Sepsis secondary to biliary infection and with history of S4 RCC w/ liver, pulm and brain mets.  PMHx including but not limited to anemia, CKD, renal CA, HTN, seizure, cholecystitis in 11/23 with cholecystostomy tube placed - and lap cholecystectomy 03/15/22.  A brain MRI confirmed increase in the size of a dominant left frontal lobe metastasis with mild worsening of edema.    PT Comments    Pt assisted with bed mobility and transfer to recliner today. Pt continues to require max assist for bed mobility and min assist for transfers from elevated surfaces.  Observed right foot drop today with step pivot to recliner which pt reports is not new (?).  Continue to recommend post acute rehab upon d/c.    Recommendations for follow up therapy are one component of a multi-disciplinary discharge planning process, led by the attending physician.  Recommendations may be updated based on patient status, additional functional criteria and insurance authorization.  Follow Up Recommendations  Skilled nursing-short term rehab (<3 hours/day) Can patient physically be transported by private vehicle: No   Assistance Recommended at Discharge Frequent or constant Supervision/Assistance  Patient can return home with the following Two people to help with walking and/or transfers;Two people to help with bathing/dressing/bathroom;Help with stairs or ramp for entrance;Assistance with cooking/housework;Assist for transportation;Assistance with feeding;Direct supervision/assist for medications management   Equipment Recommendations  None recommended by PT    Recommendations for Other Services       Precautions / Restrictions Precautions Precautions: Fall Precaution Comments: left LE edema, R foot drop observed during transfer  today 2/25     Mobility  Bed Mobility Overal bed mobility: Needs Assistance Bed Mobility: Supine to Sit     Supine to sit: Max assist, HOB elevated     General bed mobility comments: assist for bring LEs over to EOB, trunk upright and utilized bed pad to scoot to EOB    Transfers Overall transfer level: Needs assistance Equipment used: Rolling walker (2 wheels) Transfers: Sit to/from Stand, Bed to chair/wheelchair/BSC Sit to Stand: Min assist, From elevated surface, +2 safety/equipment   Step pivot transfers: Min assist, +2 safety/equipment       General transfer comment: assist to rise and stabilize, pt took steps over to recliner with cues, observed right foot drop and pt with increased hip/knee flexion on right to pick up and place foot; attempted to stand again from recliner (with pillows on seat) with armrests however pt unable to stand completely upright and hold RW with 2 attempts (with only +1 assist).    Ambulation/Gait                   Stairs             Wheelchair Mobility    Modified Rankin (Stroke Patients Only)       Balance                                            Cognition Arousal/Alertness: Awake/alert Behavior During Therapy: WFL for tasks assessed/performed Overall Cognitive Status: Within Functional Limits for tasks assessed  General Comments: Patient pleasant. Able to follow commands. [facility]        Exercises      General Comments        Pertinent Vitals/Pain Pain Assessment Pain Assessment: Faces Faces Pain Scale: Hurts little more Pain Descriptors / Indicators: Sore, Discomfort Pain Intervention(s): Monitored during session, Repositioned    Home Living                          Prior Function            PT Goals (current goals can now be found in the care plan section) Progress towards PT goals: Progressing toward goals     Frequency    Min 2X/week      PT Plan Current plan remains appropriate    Co-evaluation              AM-PAC PT "6 Clicks" Mobility   Outcome Measure  Help needed turning from your back to your side while in a flat bed without using bedrails?: A Lot Help needed moving from lying on your back to sitting on the side of a flat bed without using bedrails?: A Lot Help needed moving to and from a bed to a chair (including a wheelchair)?: A Lot Help needed standing up from a chair using your arms (e.g., wheelchair or bedside chair)?: A Lot Help needed to walk in hospital room?: Total Help needed climbing 3-5 steps with a railing? : Total 6 Click Score: 10    End of Session Equipment Utilized During Treatment: Gait belt Activity Tolerance: Patient tolerated treatment well Patient left: in chair;with call bell/phone within reach;with chair alarm set Nurse Communication: Mobility status PT Visit Diagnosis: Muscle weakness (generalized) (M62.81)     Time: 1039-1100 PT Time Calculation (min) (ACUTE ONLY): 21 min  Charges:  $Therapeutic Activity: 8-22 mins                    Phillip [name] PT, DPT Physical Therapist Acute Rehabilitation Services Preferred contact method: Secure Chat Weekend Pager Only: [phone] Office: [phone]     [name] [place] 04/22/2022, 4:16 PM

## 2022-04-23 ENCOUNTER — Other Ambulatory Visit (HOSPITAL_COMMUNITY): Payer: Self-pay

## 2022-04-23 DIAGNOSIS — R6510 Systemic inflammatory response syndrome (SIRS) of non-infectious origin without acute organ dysfunction: Secondary | ICD-10-CM | POA: Diagnosis not present

## 2022-04-23 MED ORDER — ORAL CARE MOUTH RINSE: 15.0000 mL | OROMUCOSAL | Status: DC | PRN

## 2022-04-23 NOTE — TOC Progression Note (Signed)
Transition of Care [facility]) - Progression Note    Patient Details  Name: Challen Maqueda MRN: [id number] Date of Birth: 1945-07-17  Transition of Care [facility]) CM/SW [place], LCSW Phone Number: 04/23/2022, 1:26 PM  Clinical Narrative:    CSW received a call from pt's wife [name] who reported she still has not received paperwork from [employer] to start the last appeal. Mrs Cristela Blue also inquired about POA/HCPOA. CSW explained to pt's wife the hospital can only do HCPOA paperwork, and she will need to follow up with the chaplain for this. She is requesting consultation with Mable Fill, CSW explained pt will need to be oriented to fill out this paperwork. MD was made aware. TOC to follow.    Expected Discharge Plan: [place] Barriers to Discharge: Continued Medical Work up  Expected Discharge Plan and [place] arrangements for the past 2 months: Single Family Home Expected Discharge Date: 04/11/22                                     Social Determinants of Health (SDOH) Interventions [place]: No Food Insecurity (03/30/2022)  Housing: Low Risk  (03/30/2022)  Transportation Needs: No Transportation Needs (03/30/2022)  Utilities: Not At Risk (03/30/2022)  Tobacco Use: Low Risk  (03/30/2022)    Readmission Risk Interventions    01/09/2022    1:14 PM  Readmission Risk Prevention Plan  Transportation Screening Complete  PCP or Specialist Appt within 3-5 Days Complete  Social Work Consult for [place] Planning/Counseling Complete  Palliative Care Screening Complete  Medication Review Press photographer) Complete

## 2022-04-23 NOTE — Progress Notes (Signed)
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  In the emergency department he was febrile, lab work showed leukocytosis, hypercalcemia.CT C/A/P showed small gas collection in the gallbladder fossa following cholecystectomy,potential abscess formation,expansion of LEFT acetabular metastasis into the operator fossa.  Patient was initially admitted with diagnosis of sepsis secondary to biliary infection.  General surgery, IR consulted, started on antibiotics.  Sepsis physiology has improved.  Hospital course complicated by acute confusion, urinary retension, acute left lower extremity DVT.  PT/OT recommending SNF but insurance declined.  Family has appealed , decision pending.  TOC following.  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  There was concern for biliary infection/possible abscess but no definitive source identified.  General surgery, IR were following.  Completed 10 days course of IV antibiotics, resolution of fever.  Negative cultures.  Still has mild leukocytosis most likely secondary to underlying cancer.  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  Mental status has  improved but remains confused to time, obeys commands.  Not agitated  Urine  retention/AKI/hematuria: Improved renal function.  Failed voiding trial, patient will be discharged with Foley  Acute left lower extremity DVT: Extensive DVT as per the Doppler including multiple veins.  Case was discussed with oncology, currently on Eliquis .Edema has improved  Scrotal edema: Suspected to be related to volume overload.  Managed with scrotal elevation  Stage IV renal cancer with metastasis: Recent MRI showed brain metastasis.  On Decadron.  Continue Keppra for seizure control.  Oncology recommended palliative care/hospice approach  Normocytic anemia: Hemoglobin dropped to the range of 6 on 2/22 and was transfused unit of PRBC.  Hemoglobin currently in the range of 8.FOBT positive. No report of gross hematochezia or melena.  [facility] consulted, recommended to observe/conservative management. Eliquis  restarted . Iron studies showed low iron, given IV iron  Hypokalemia/hypophosphatemia: Currently being monitored and supplemented as needed  Goals of care: Metastatic lung cancer along with multiple medical problems.  Very poor prognosis.  Has brain metastasis.  Follows with oncology, Dr. Learta Codding who is  recommending palliative  care referral when he returns home.  CODE STATUS is DNR.  Consulted in-house consultation for palliative care .  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 (Active)  04/13/22 0900  Location: Sacrum  Location Orientation: Mid  Staging: Stage 2 -  Partial thickness loss of dermis presenting as a shallow open injury with a red, pink wound bed without slough.  Wound  Description (Comments):   Present on Admission: No  Dressing Type Foam - Lift dressing to assess site every shift 04/22/22 2000    DVT prophylaxis:Place TED hose Start: 04/06/22 1453 SCDs Start: 03/30/22 1624 apixaban (ELIQUIS) tablet 5 mg     Code Status: DNR  Family Communication: Called and discussed with wife on phone on 2/22  Patient status:Inpatient  Patient is from :Home  Anticipated discharge to:SNF  Estimated DC date:not sure,waiting for auth,wife has put appeal   Consultants: Oncology,GI  Procedures: None  Antimicrobials:  Anti-infectives (From admission, onward)    Start     Dose/Rate Route Frequency Ordered Stop   04/06/22 1030  amoxicillin-clavulanate (AUGMENTIN) 875-125 MG per tablet 1 tablet        1 tablet Oral Every 12 hours 04/06/22 0935 04/08/22 2127   04/05/22 1800  ceFEPIme (MAXIPIME) 2 g in sodium chloride 0.9 % 100 mL IVPB  Status:  Discontinued        2 g 200 mL/hr over 30 Minutes Intravenous Every 12 hours 04/05/22 1121 04/06/22 0935   03/31/22 1600  vancomycin (VANCOREADY) IVPB 1250 mg/250 mL  Status:  Discontinued        1,250 mg 166.7 mL/hr over 90 Minutes Intravenous Every 24 hours 03/31/22 0843 04/02/22 0809   03/31/22 1300  vancomycin (VANCOREADY) IVPB 1500 mg/300 mL  Status:  Discontinued        1,500 mg 150 mL/hr over 120 Minutes Intravenous Every 24 hours 03/30/22 1447 03/31/22 0843   03/30/22 2200  metroNIDAZOLE (FLAGYL) IVPB 500 mg  Status:  Discontinued        500 mg 100 mL/hr over 60 Minutes Intravenous Every 12 hours 03/30/22 1623 03/30/22 1630   03/30/22 2200  metroNIDAZOLE (FLAGYL) IVPB 500 mg  Status:  Discontinued        500 mg 100 mL/hr over 60 Minutes Intravenous Every 12 hours 03/30/22 1446 04/06/22 0935   03/30/22 2100  ceFEPIme (MAXIPIME) 2 g in sodium chloride 0.9 % 100 mL IVPB  Status:  Discontinued        2 g 200 mL/hr over 30 Minutes Intravenous Every 8 hours 03/30/22 1440 04/05/22 1121   03/30/22 1045  vancomycin  (VANCOREADY) IVPB 2000 mg/400 mL        2,000 mg 200 mL/hr over 120 Minutes Intravenous  Once 03/30/22 1038 03/30/22 1529   03/30/22 1030  ceFEPIme (MAXIPIME) 2 g in sodium chloride 0.9 % 100 mL IVPB        2 g 200 mL/hr over 30 Minutes Intravenous  Once 03/30/22 1029 03/30/22 1250   03/30/22 1030  metroNIDAZOLE (FLAGYL) IVPB 500 mg        500 mg 100 mL/hr over 60 Minutes Intravenous  Once 03/30/22 1029 03/30/22 1400   03/30/22 1030  vancomycin (VANCOCIN) IVPB 1000 mg/200 mL premix  Status:  Discontinued        1,000 mg 200 mL/hr over 60 Minutes Intravenous  Once 03/30/22 1029 03/30/22 1038       Subjective: Patient seen and examined at bedside today.  Hemodynamically stable.  Appears comfortable, lying on bed.  Alert and awake.  Complains of pain in bilateral hips.  Not in any distress  Objective: Vitals:   04/22/22 0425 04/22/22 1409 04/22/22 2024 04/23/22 0448  BP: 117/65 (!) 112/57 131/74 133/67  Pulse: 72 95 97 70  Resp:  '17 18 18  '$ Temp: 98 F (36.7 C) 97.7 F (36.5 C) 98 F (36.7 C) 97.7 F (36.5 C)  TempSrc: Oral Oral Oral   SpO2: 97% 96% 97% 99%  Weight:      Height:        Intake/Output Summary (Last 24 hours) at 04/23/2022 1348 Last data filed at 04/23/2022 0844 Gross per 24 hour  Intake 460 ml  Output 1950 ml  Net -1490 ml   Filed Weights   03/30/22 1029 04/16/22 0500  Weight: 102.1 kg 95.2 kg    Examination:   General exam: Overall comfortable, not in distress,obese, deconditioned, weak appearing HEENT: PERRL Respiratory system:  no wheezes or crackles  Cardiovascular system: S1 & S2 heard, RRR.  Chemo-Port on the right chest Gastrointestinal system: Abdomen is nondistended, soft and nontender. Central nervous system: Alert and awake,oriented to place Extremities: No edema, no clubbing ,no cyanosis Skin: No rashes, no ulcers,no icterus   GU: Foley, scrotal edema  Data Reviewed: I have personally reviewed following labs and imaging  studies  CBC: Recent Labs  Lab 04/19/22 0414 04/20/22 0332 04/21/22 0310 04/22/22 0309  WBC 14.5* 13.7* 14.8* 14.5*  HGB 6.8* 8.3* 8.8* 8.6*  HCT 23.2* 27.5* 29.0* 28.6*  MCV 83.2 83.6 84.5 84.6  PLT 347 339 345 [id number]   Basic Metabolic Panel: Recent Labs  Lab 04/20/22 0332 04/21/22 0310  NA 129* 132*  K 3.9 3.9  CL 96* 99  CO2 26 26  GLUCOSE 174* 164*  BUN 11 12  CREATININE 0.47* 0.42*  CALCIUM 8.7* 8.9     No results found for this or any previous visit (from the past 240 hour(s)).   Radiology Studies: No results found.  Scheduled Meds:  apixaban  5 mg Oral BID   Chlorhexidine Gluconate Cloth  6 each Topical Daily   dexamethasone  4 mg Oral Daily   feeding supplement  237 mL Oral Q24H   ferrous sulfate  325 mg Oral Q breakfast   levETIRAcetam  250 mg Oral BID   mouth rinse  15 mL Mouth Rinse 4 times per day   oxyCODONE  10-20 mg Oral Daily   oxyCODONE  20 mg Oral QHS   testosterone  5 g Transdermal Daily   Continuous Infusions:     LOS: 24 days   Shelly Coss, MD [facility] P2/26/2024, 1:48 PM

## 2022-04-23 NOTE — Progress Notes (Signed)
Mobility Specialist - Progress Note   04/23/22 1221  Mobility  Activity Turned to right side;Turned to left side;Transferred from bed to chair  Level of Assistance Minimal assist, patient does 75% or more  Assistive Device MaxiMove  Activity Response Tolerated well  $Mobility charge 1 Mobility   Pt was found in bed about to eat lunch but agreeable to transfer to recliner chair for lunch. Pt was able to do bed mobility with min-A for turns. NT in room to assist with transfer. After transfer was left with NT in room and wife.  Ferd Hibbs Mobility Specialist

## 2022-04-23 NOTE — Progress Notes (Signed)
Occupational Therapy Treatment Patient Details Name: Phillip [name] MRN: [id number] DOB: January 13, 1946 Today's Date: 04/23/2022   History of present illness Pt is a 77 year old male admitted for Sepsis secondary to biliary infection and with history of S4 RCC w/ liver, pulm and brain mets.  PMHx including but not limited to anemia, CKD, renal CA, HTN, seizure, cholecystitis in 11/23 with cholecystostomy tube placed - and lap cholecystectomy 03/15/22.  A brain MRI confirmed increase in the size of a dominant left frontal lobe metastasis with mild worsening of edema.   OT comments  Patient was +2 for sit to stand out of recliner with +1 with RW and gait belt to turn from recliner to bed. Patient's wife was present during session. Patients wife and patient were educated on compensatory strategies for toileting hygiene in seated v.s. standing. Patients wife verbalized understanding. Patient's discharge plan remains appropriate at this time. OT will continue to follow acutely.     Recommendations for follow up therapy are one component of a multi-disciplinary discharge planning process, led by the attending physician.  Recommendations may be updated based on patient status, additional functional criteria and insurance authorization.    Follow Up Recommendations  Skilled nursing-short term rehab (<3 hours/day)     Assistance Recommended at Discharge Frequent or constant Supervision/Assistance  Patient can return home with the following  Assistance with cooking/housework;Direct supervision/assist for medications management;Direct supervision/assist for financial management;Assist for transportation;Help with stairs or ramp for entrance;A lot of help with bathing/dressing/bathroom;Two people to help with walking and/or transfers   [facility] bed;Other (comment) (hoyer lift)       Precautions / Restrictions Precautions Precautions: Fall Precaution Comments: left LE edema, R  foot drop Restrictions Weight Bearing Restrictions: No       Mobility Bed Mobility Overal bed mobility: Needs Assistance Bed Mobility: Sit to Supine       Sit to supine: Max assist, +2 for safety/equipment   General bed mobility comments: assist for bring LEs onto bed and physical A to keep trunk under control      Balance Overall balance assessment: Needs assistance Sitting-balance support: No upper extremity supported, Feet supported Sitting balance-Leahy Scale: Fair     Standing balance support: During functional activity, Reliant on assistive device for balance Standing balance-Leahy Scale: Poor                             ADL either performed or assessed with clinical judgement   ADL Overall ADL's : Needs assistance/impaired       General ADL Comments: Patient was seated in recliner with max A +2 for sit to stand with cues for leaning nose over toes. patient once standing was +1 for turning with min A and cues for proper sequencing of task. patient's wife was present during session.      Cognition Arousal/Alertness: Awake/alert Behavior During Therapy: WFL for tasks assessed/performed Overall Cognitive Status: Within Functional Limits for tasks assessed           General Comments: Patient is plesant and HOH. wife was present patient able to follow commands                   Pertinent Vitals/ Pain       Pain Assessment Pain Assessment: Faces Faces Pain Scale: Hurts little more Pain Location: buttucks Pain Descriptors / Indicators: Sore, Discomfort Pain Intervention(s): Limited activity within patient's tolerance, Monitored during session  Frequency  Min 2X/week        Progress Toward Goals  OT Goals(current goals can now be found in the care plan section)  Progress towards OT goals: Progressing toward goals     Plan Discharge plan remains appropriate       AM-PAC OT "6 Clicks" Daily Activity     Outcome  Measure   Help from another person eating meals?: A Little Help from another person taking care of personal grooming?: A Little Help from another person toileting, which includes using toliet, bedpan, or urinal?: Total Help from another person bathing (including washing, rinsing, drying)?: A Lot Help from another person to put on and taking off regular upper body clothing?: A Little Help from another person to put on and taking off regular lower body clothing?: A Lot 6 Click Score: 14    End of Session Equipment Utilized During Treatment: Rolling walker (2 wheels);Gait belt  OT Visit Diagnosis: Unsteadiness on feet (R26.81);Other abnormalities of gait and mobility (R26.89);Muscle weakness (generalized) (M62.81);Other symptoms and signs involving cognitive function;Pain   Activity Tolerance Patient tolerated treatment well   Patient Left with call bell/phone within reach;in bed;with bed alarm set;with family/visitor present   Nurse Communication          Time: [id number] OT Time Calculation (min): 16 min  Charges: OT General Charges $OT Visit: 1 Visit OT Treatments $Self Care/Home Management : 8-22 mins  Rennie Plowman, MS Acute Rehabilitation Department Office# [phone]   Willa Rough 04/23/2022, 4:58 PM

## 2022-04-23 NOTE — Plan of Care (Signed)
  Problem: Respiratory: Goal: Ability to maintain adequate ventilation will improve Outcome: Progressing   Problem: Education: Goal: Knowledge of General Education information will improve Description: Including pain rating scale, medication(s)/side effects and non-pharmacologic comfort measures Outcome: Progressing   Problem: Coping: Goal: Level of anxiety will decrease Outcome: Progressing   Problem: Elimination: Goal: Will not experience complications related to urinary retention Outcome: Progressing   Problem: Pain Managment: Goal: General experience of comfort will improve Outcome: Progressing   Problem: Safety: Goal: Ability to remain free from injury will improve Outcome: Progressing   Problem: Skin Integrity: Goal: Risk for impaired skin integrity will decrease Outcome: Progressing

## 2022-04-24 ENCOUNTER — Encounter: Payer: Self-pay | Admitting: Internal Medicine

## 2022-04-24 ENCOUNTER — Other Ambulatory Visit (HOSPITAL_COMMUNITY): Payer: Self-pay

## 2022-04-24 DIAGNOSIS — R6510 Systemic inflammatory response syndrome (SIRS) of non-infectious origin without acute organ dysfunction: Secondary | ICD-10-CM | POA: Diagnosis not present

## 2022-04-24 LAB — CBC
HCT: 28.9 % — ABNORMAL LOW (ref 39.0–52.0)
Hemoglobin: 8.6 g/dL — ABNORMAL LOW (ref 13.0–17.0)
MCH: 25.7 pg — ABNORMAL LOW (ref 26.0–34.0)
MCHC: 29.8 g/dL — ABNORMAL LOW (ref 30.0–36.0)
MCV: 86.5 fL (ref 80.0–100.0)
Platelets: 293 10*3/uL (ref 150–400)
RBC: 3.34 MIL/uL — ABNORMAL LOW (ref 4.22–5.81)
RDW: 17.8 % — ABNORMAL HIGH (ref 11.5–15.5)
WBC: 13.8 10*3/uL — ABNORMAL HIGH (ref 4.0–10.5)
nRBC: 0 % (ref 0.0–0.2)

## 2022-04-24 LAB — BASIC METABOLIC PANEL
Anion gap: 8 (ref 5–15)
BUN: 13 mg/dL (ref 8–23)
CO2: 28 mmol/L (ref 22–32)
Calcium: 9.6 mg/dL (ref 8.9–10.3)
Chloride: 96 mmol/L — ABNORMAL LOW (ref 98–111)
Creatinine, Ser: 0.51 mg/dL — ABNORMAL LOW (ref 0.61–1.24)
GFR, Estimated: >60 mL/min (ref >60)
Glucose, Bld: 140 mg/dL — ABNORMAL HIGH (ref 70–99)
Potassium: 4 mmol/L (ref 3.5–5.1)
Sodium: 132 mmol/L — ABNORMAL LOW (ref 135–145)

## 2022-04-24 MED ORDER — SENNOSIDES-DOCUSATE SODIUM 8.6-50 MG PO TABS
1.0000 | ORAL_TABLET | Freq: Two times a day (BID) | ORAL | Status: DC
  Administered 2022-04-24 – 2022-04-30 (×9): 1 via ORAL
  Filled 2022-04-24 (×10): qty 1

## 2022-04-24 MED ORDER — POLYETHYLENE GLYCOL 3350 17 G PO PACK
17.0000 g | PACK | Freq: Every day | ORAL | Status: DC
  Administered 2022-04-24 – 2022-04-30 (×7): 17 g via ORAL
  Filled 2022-04-24 (×7): qty 1

## 2022-04-24 NOTE — Progress Notes (Signed)
PT Cancellation Note  Patient Details Name: Phillip [name] MRN: [id number] DOB: 1946-01-09   Cancelled Treatment:    Reason Eval/Treat Not Completed: Fatigue/lethargy limiting ability to participate (Pt just transferred to recliner with mechanical lift with nursing. Attempted to stand with nursing but was unable to, he also rolled multiple times for pericare. Now too fatigued to do PT.) I demonstrated BLE strengthening exercises for pt to attempt later, spouse present.    Blondell Reveal Kistler PT 04/24/2022  Acute Rehabilitation Services  Office [phone]

## 2022-04-24 NOTE — Progress Notes (Signed)
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  In the emergency department he was febrile, lab work showed leukocytosis, hypercalcemia.CT C/A/P showed small gas collection in the gallbladder fossa following cholecystectomy,potential abscess formation,expansion of LEFT acetabular metastasis into the operator fossa.  Patient was initially admitted with diagnosis of sepsis secondary to biliary infection.  General surgery, IR consulted, started on antibiotics.  Sepsis physiology has improved.  Hospital course complicated by acute confusion, urinary retension, acute left lower extremity DVT.  PT/OT recommending SNF but insurance declined.  Family has appealed , decision pending.  TOC following.  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  There was concern for biliary infection/possible abscess but no definitive source identified.  General surgery, IR were following.  Completed 10 days course of IV antibiotics, resolution of fever.  Negative cultures.  Still has mild leukocytosis most likely secondary to underlying cancer.  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  Mental status has  improved but remains confused to time, obeys commands.  Not agitated  Urine  retention/AKI/hematuria: Improved renal function.  Failed voiding trial, patient will be discharged with Foley  Acute left lower extremity DVT: Extensive DVT as per the Doppler including multiple veins.  Case was discussed with oncology, currently on Eliquis .Edema has improved  Scrotal edema: Suspected to be related to volume overload.  Managed with scrotal elevation  Stage IV renal cancer with metastasis: Recent MRI showed brain metastasis.  On Decadron.  Continue Keppra for seizure control.  Oncology recommended palliative care/hospice approach  Normocytic anemia: Hemoglobin dropped to the range of 6 on 2/22 and was transfused unit of PRBC.  Hemoglobin currently in the range of 8.FOBT positive. No report of gross hematochezia or melena.  [facility] consulted, recommended to observe/conservative management. Eliquis  restarted . Iron studies showed low iron, given IV iron  Hypokalemia/hypophosphatemia: Currently being monitored and supplemented as needed  Goals of care: Metastatic lung cancer along with multiple medical problems.  Very poor prognosis.  Has brain metastasis.  Follows with oncology, Dr. Learta Codding who is  recommending palliative  care referral when he returns home.  CODE STATUS is DNR.  Consulted in-house consultation for palliative care .  Plan is to discharge to rehab with ultimate goal of returning home and outpatient palliative care follow-up     Nutrition Problem: Increased nutrient needs Etiology: chronic illness (metastatic renal cell carcinoma involving the brain/lung/bone and stage 2 pressure injury) Pressure Injury 04/13/22 Sacrum Mid Stage 2 -  Partial thickness loss of dermis presenting as a shallow open injury with a red, pink wound bed without slough. (Active)  04/13/22 0900  Location: Sacrum  Location Orientation: Mid  Staging: Stage 2 -  Partial thickness loss of dermis presenting as a shallow open injury with a red, pink wound bed without slough.  Wound Description  (  Comments):   Present on Admission: No  Dressing Type Foam - Lift dressing to assess site every shift 04/24/22 0827    DVT prophylaxis:Place TED hose Start: 04/06/22 1453 SCDs Start: 03/30/22 1624 apixaban (ELIQUIS) tablet 5 mg     Code Status: DNR  Family Communication: Called and discussed with wife on phone on 2/22  Patient status:Inpatient  Patient is from :Home  Anticipated discharge to:SNF  Estimated DC date:not sure,waiting for auth,wife has put appeal   Consultants: Oncology,GI  Procedures: None  Antimicrobials:  Anti-infectives (From admission, onward)    Start     Dose/Rate Route Frequency Ordered Stop   04/06/22 1030  amoxicillin-clavulanate (AUGMENTIN) 875-125 MG per tablet 1 tablet        1 tablet Oral Every 12 hours 04/06/22 0935 04/08/22 2127   04/05/22 1800  ceFEPIme (MAXIPIME) 2 g in sodium chloride 0.9 % 100 mL IVPB  Status:  Discontinued        2 g 200 mL/hr over 30 Minutes Intravenous Every 12 hours 04/05/22 1121 04/06/22 0935   03/31/22 1600  vancomycin (VANCOREADY) IVPB 1250 mg/250 mL  Status:  Discontinued        1,250 mg 166.7 mL/hr over 90 Minutes Intravenous Every 24 hours 03/31/22 0843 04/02/22 0809   03/31/22 1300  vancomycin (VANCOREADY) IVPB 1500 mg/300 mL  Status:  Discontinued        1,500 mg 150 mL/hr over 120 Minutes Intravenous Every 24 hours 03/30/22 1447 03/31/22 0843   03/30/22 2200  metroNIDAZOLE (FLAGYL) IVPB 500 mg  Status:  Discontinued        500 mg 100 mL/hr over 60 Minutes Intravenous Every 12 hours 03/30/22 1623 03/30/22 1630   03/30/22 2200  metroNIDAZOLE (FLAGYL) IVPB 500 mg  Status:  Discontinued        500 mg 100 mL/hr over 60 Minutes Intravenous Every 12 hours 03/30/22 1446 04/06/22 0935   03/30/22 2100  ceFEPIme (MAXIPIME) 2 g in sodium chloride 0.9 % 100 mL IVPB  Status:  Discontinued        2 g 200 mL/hr over 30 Minutes Intravenous Every 8 hours 03/30/22 1440 04/05/22 1121   03/30/22 1045  vancomycin (VANCOREADY)  IVPB 2000 mg/400 mL        2,000 mg 200 mL/hr over 120 Minutes Intravenous  Once 03/30/22 1038 03/30/22 1529   03/30/22 1030  ceFEPIme (MAXIPIME) 2 g in sodium chloride 0.9 % 100 mL IVPB        2 g 200 mL/hr over 30 Minutes Intravenous  Once 03/30/22 1029 03/30/22 1250   03/30/22 1030  metroNIDAZOLE (FLAGYL) IVPB 500 mg        500 mg 100 mL/hr over 60 Minutes Intravenous  Once 03/30/22 1029 03/30/22 1400   03/30/22 1030  vancomycin (VANCOCIN) IVPB 1000 mg/200 mL premix  Status:  Discontinued        1,000 mg 200 mL/hr over 60 Minutes Intravenous  Once 03/30/22 1029 03/30/22 1038       Subjective: Patient seen and examined at bedside today.  Hemodynamically stable.  Lying in bed, comfortable but weak.  No new complaints  Objective: Vitals:   04/23/22 1358 04/23/22 2016 04/24/22 0522 04/24/22 1243  BP: 128/67 126/62 (!) 123/58 (!) 120/52  Pulse: 90 100 63 80  Resp: '16 19 18 16  '$ Temp: 97.9 F (36.6 C) 98 F (36.7 C) 97.6 F (36.4 C) 98.2 F (36.8 C)  TempSrc: Oral Oral Oral Oral  SpO2: 96% 98% (!) 89%  96%  Weight:      Height:        Intake/Output Summary (Last 24 hours) at 04/24/2022 1346 Last data filed at 04/24/2022 1234 Gross per 24 hour  Intake 650 ml  Output 2250 ml  Net -1600 ml   Filed Weights   03/30/22 1029 04/16/22 0500  Weight: 102.1 kg 95.2 kg    Examination:  General exam: Overall comfortable, not in distress,obese, deconditioned, weak HEENT: PERRL Respiratory system:  no wheezes or crackles  Cardiovascular system: S1 & S2 heard, RRR.  Chemo-Port on the right chest Gastrointestinal system: Abdomen is nondistended, soft and nontender. Central nervous system: Alert and oriented to place only Extremities: No edema, no clubbing ,no cyanosis Skin: No rashes, no ulcers,no icterus GU: Foley  Data Reviewed: I have personally reviewed following labs and imaging studies  CBC: Recent Labs  Lab 04/19/22 0414 04/20/22 0332 04/21/22 0310 04/22/22 0309  04/24/22 0755  WBC 14.5* 13.7* 14.8* 14.5* 13.8*  HGB 6.8* 8.3* 8.8* 8.6* 8.6*  HCT 23.2* 27.5* 29.0* 28.6* 28.9*  MCV 83.2 83.6 84.5 84.6 86.5  PLT 347 339 345 311 0000000   Basic Metabolic Panel: Recent Labs  Lab 04/20/22 0332 04/21/22 0310 04/24/22 0755  NA 129* 132* 132*  K 3.9 3.9 4.0  CL 96* 99 96*  CO2 '26 26 28  '$ GLUCOSE 174* 164* 140*  BUN '11 12 13  '$ CREATININE 0.47* 0.42* 0.51*  CALCIUM 8.7* 8.9 9.6     No results found for this or any previous visit (from the past 240 hour(s)).   Radiology Studies: No results found.  Scheduled Meds:  apixaban  5 mg Oral BID   Chlorhexidine Gluconate Cloth  6 each Topical Daily   dexamethasone  4 mg Oral Daily   feeding supplement  237 mL Oral Q24H   ferrous sulfate  325 mg Oral Q breakfast   levETIRAcetam  250 mg Oral BID   mouth rinse  15 mL Mouth Rinse 4 times per day   oxyCODONE  10-20 mg Oral Daily   oxyCODONE  20 mg Oral QHS   testosterone  5 g Transdermal Daily   Continuous Infusions:     LOS: 25 days   Shelly Coss, MD [facility] P2/27/2024, 1:46 PM

## 2022-04-24 NOTE — TOC Progression Note (Signed)
Transition of Care [facility]) - Progression Note    Patient Details  Name: Phillip [name] MRN: [id number] Date of Birth: [birth year]  Transition of Care [facility]) CM/SW Contact  Joaquin Courts, RN Phone Number: 04/24/2022, 1:00 PM  Clinical Narrative:    CM spoke with patient's wife regarding next steps in discharge planning.  Wife reports she has contacted [employer] and was informed that the letter of denial from [employer] has been sent, she was informed she cannot pick it up in person or receive it via email.  Spouse affirms that she wishes to exercise her right to appeal the [employer] denial and intends to continue to pursue all appeal options until they are exhausted.  Spouse feels patient is not safe at home as she cannot provide the level of care he needs and needs to rehab before returning home.  Spouse states that patient has had stage 4 cancer for seven years and has previously been able to to rally and she is hopeful for the same outcome again.  CM did discuss questions spouse's plans if patient does not "rally", spouse reports she would then need to take patient home with hospice but is not yet at this point.  Spouse maintains that she wishes to pursue all possible appeals.  Does report that she is also attempting to gather funds for private pay for one month stay in SNF and shares that she needs 9000$ for room and board which does not include the cost of therapies.  However spouse also shares that is she private pays she knows she will not be reimbursed for her costs down the road. Spouse reports she feels this a a quality of life concern and intends to pursue all avenues to obtain rehab through appeals.   Expected Discharge Plan: [place] Barriers to Discharge: Continued Medical Work up  Expected Discharge Plan and [place] arrangements for the past 2 months: Single Family Home Expected Discharge Date: 04/11/22                                      Social Determinants of Health (SDOH) Interventions [place]: No Food Insecurity (03/30/2022)  Housing: Low Risk  (03/30/2022)  Transportation Needs: No Transportation Needs (03/30/2022)  Utilities: Not At Risk (03/30/2022)  Tobacco Use: Low Risk  (03/30/2022)    Readmission Risk Interventions    01/09/2022    1:14 PM  Readmission Risk Prevention Plan  Transportation Screening Complete  PCP or Specialist Appt within 3-5 Days Complete  Social Work Consult for [place] Planning/Counseling Complete  Palliative Care Screening Complete  Medication Review [employer]) Complete

## 2022-04-25 ENCOUNTER — Other Ambulatory Visit (HOSPITAL_COMMUNITY): Payer: Self-pay

## 2022-04-25 DIAGNOSIS — R6510 Systemic inflammatory response syndrome (SIRS) of non-infectious origin without acute organ dysfunction: Secondary | ICD-10-CM | POA: Diagnosis not present

## 2022-04-25 NOTE — Progress Notes (Deleted)
Pt pulled out his percutaneous drainage. Notified Gershon Cull, NP. Covered the site with Vaseline gauze, 4x4 gauze, and medipore tape.

## 2022-04-25 NOTE — TOC Progression Note (Addendum)
Transition of Care [facility]) - Progression Note    Patient Details  Name: Phillip [name] MRN: [id number] Date of Birth: 1945-12-24  Transition of Care [facility]) CM/SW Contact  Joaquin Courts, RN Phone Number: 04/25/2022, 3:28 PM  Clinical Narrative:    CM received call from spouse who reports she has made the decision to pay privately for patient to go to SNF.  Reports she has spoken with Christella Hartigan and facility can accept patient on Monday 3/5. CM spoke with admissions and verified that they can accept on Monday and will need a dc summary on day of DC.    Expected Discharge Plan: [place] Barriers to Discharge: Continued Medical Work up  Expected Discharge Plan and [place] arrangements for the past 2 months: Single Family Home Expected Discharge Date: 04/11/22                                     Social Determinants of Health (SDOH) Interventions [place]: No Food Insecurity (03/30/2022)  Housing: Low Risk  (03/30/2022)  Transportation Needs: No Transportation Needs (03/30/2022)  Utilities: Not At Risk (03/30/2022)  Tobacco Use: Low Risk  (03/30/2022)    Readmission Risk Interventions    01/09/2022    1:14 PM  Readmission Risk Prevention Plan  Transportation Screening Complete  PCP or Specialist Appt within 3-5 Days Complete  Social Work Consult for [place] Planning/Counseling Complete  Palliative Care Screening Complete  Medication Review Press photographer) Complete

## 2022-04-25 NOTE — Progress Notes (Signed)
Physical Therapy Treatment Patient Details Name: Phillip [name] MRN: [id number] DOB: 01/13/1946 Today's Date: 04/25/2022   History of Present Illness Pt is a 77 year old male admitted for Sepsis secondary to biliary infection and with history of S4 RCC w/ liver, pulm and brain mets.  PMHx including but not limited to anemia, CKD, renal CA, HTN, seizure, cholecystitis in 11/23 with cholecystostomy tube placed - and lap cholecystectomy 03/15/22.  A brain MRI confirmed increase in the size of a dominant left frontal lobe metastasis with mild worsening of edema.    PT Comments    The patient is alert, wife at bedside. Patient able to stand with mod assist and step  to bed using Rw. \Patient  demonstrates improved   standing and balance.   Recommendations for follow up therapy are one component of a multi-disciplinary discharge planning process, led by the attending physician.  Recommendations may be updated based on patient status, additional functional criteria and insurance authorization.  Follow Up Recommendations  Skilled nursing-short term rehab (<3 hours/day) Can patient physically be transported by private vehicle: No   Assistance Recommended at Discharge Frequent or constant Supervision/Assistance  Patient can return home with the following Two people to help with walking and/or transfers;Two people to help with bathing/dressing/bathroom;Help with stairs or ramp for entrance;Assistance with cooking/housework;Assist for transportation;Assistance with feeding;Direct supervision/assist for medications management   Equipment Recommendations  None recommended by PT    Recommendations for Other Services       Precautions / Restrictions Precautions Precautions: Fall Precaution Comments: left LE edema, R foot drop     Mobility  Bed Mobility   Bed Mobility: Sit to Supine       Sit to supine: Max assist, +2 for safety/equipment   General bed mobility comments: assisted  legs and  trunk    Transfers Overall transfer level: Needs assistance Equipment used: Rolling walker (2 wheels) Transfers: Sit to/from Stand, Bed to chair/wheelchair/BSC Sit to Stand: Mod assist, From elevated surface   Step pivot transfers: Min assist, +2 safety/equipment       General transfer comment: Mod assist to stand  from recliner, min assist to take steps to turn to bed with walker with verbal cues and tactile cues for posture. Patient able to control descent.    Ambulation/Gait                   Stairs             Wheelchair Mobility    Modified Rankin (Stroke Patients Only)       Balance Overall balance assessment: Needs assistance Sitting-balance support: No upper extremity supported, Feet supported Sitting balance-Leahy Scale: Fair     Standing balance support: During functional activity, Reliant on assistive device for balance Standing balance-Leahy Scale: Poor Standing balance comment: min A with RW                            Cognition Arousal/Alertness: Awake/alert Behavior During Therapy: WFL for tasks assessed/performed Overall Cognitive Status: Within Functional Limits for tasks assessed Area of Impairment: Problem solving, Attention, Memory, Following commands, Safety/judgement, Awareness                               General Comments: Patient is plesant and HOH. wife was present patient able to follow commands        Exercises  General Comments        Pertinent Vitals/Pain Pain Assessment Pain Assessment: No/denies pain    Home Living                          Prior Function            PT Goals (current goals can now be found in the care plan section) Progress towards PT goals: Progressing toward goals    Frequency    Min 2X/week      PT Plan Current plan remains appropriate    Co-evaluation              AM-PAC PT "6 Clicks" Mobility   Outcome Measure  Help needed  turning from your back to your side while in a flat bed without using bedrails?: A Lot Help needed moving from lying on your back to sitting on the side of a flat bed without using bedrails?: A Lot Help needed moving to and from a bed to a chair (including a wheelchair)?: A Lot Help needed standing up from a chair using your arms (e.g., wheelchair or bedside chair)?: A Lot Help needed to walk in hospital room?: Total Help needed climbing 3-5 steps with a railing? : Total 6 Click Score: 10    End of Session Equipment Utilized During Treatment: Gait belt Activity Tolerance: Patient tolerated treatment well Patient left: in bed;with call bell/phone within reach;with bed alarm set;with family/visitor present Nurse Communication: Mobility status PT Visit Diagnosis: Muscle weakness (generalized) (M62.81)     Time: [id number] PT Time Calculation (min) (ACUTE ONLY): 16 min  Charges:  $Therapeutic Activity: 8-22 mins                     [place] Office [phone] Weekend pager-[phone]    Claretha Cooper 04/25/2022, 4:50 PM

## 2022-04-25 NOTE — Progress Notes (Signed)
Occupational Therapy Treatment Patient Details Name: Phillip [name] MRN: [id number] DOB: 1945/09/23 Today's Date: 04/25/2022   History of present illness Pt is a 77 year old male admitted for Sepsis secondary to biliary infection and with history of S4 RCC w/ liver, pulm and brain mets.  PMHx including but not limited to anemia, CKD, renal CA, HTN, seizure, cholecystitis in 11/23 with cholecystostomy tube placed - and lap cholecystectomy 03/15/22.  A brain MRI confirmed increase in the size of a dominant left frontal lobe metastasis with mild worsening of edema.   OT comments  Treatment focused on functional mobility to promote strength, endurance, tolerance to prepare for OOB activities. Patient continues to be max assist for bed mobility but requiring decreased assistance for sit to stand and transfer to recliner. Patient staying flexed over walker and not extending elbows enough on walker despite therapist verbal and tactile cues. Patient placed in recliner and future treatment interrupted by meal tray. Patient making progress continue POC.    Recommendations for follow up therapy are one component of a multi-disciplinary discharge planning process, led by the attending physician.  Recommendations may be updated based on patient status, additional functional criteria and insurance authorization.    Follow Up Recommendations  Skilled nursing-short term rehab (<3 hours/day)     Assistance Recommended at Discharge Frequent or constant Supervision/Assistance  Patient can return home with the following  Assistance with cooking/housework;Direct supervision/assist for medications management;Direct supervision/assist for financial management;Assist for transportation;Help with stairs or ramp for entrance;A lot of help with bathing/dressing/bathroom;A lot of help with walking and/or transfers   Equipment Recommendations  Hospital bed;Other (comment)    Recommendations for Other Services       Precautions / Restrictions Precautions Precautions: Fall Precaution Comments: left LE edema, R foot drop Restrictions Weight Bearing Restrictions: No       Mobility Bed Mobility Overal bed mobility: Needs Assistance Bed Mobility: Supine to Sit     Supine to sit: Max assist, HOB elevated     General bed mobility comments: min assist for LEs, patient assisting,max assist for trunk and pivoting hips with bed pad    Transfers Overall transfer level: Needs assistance Equipment used: Rolling walker (2 wheels) Transfers: Sit to/from Stand, Bed to chair/wheelchair/BSC Sit to Stand: Mod assist, From elevated surface     Step pivot transfers: Min guard, +2 safety/equipment     General transfer comment: Mod assist to stand x 1 person and min guard to take steps to recliner with walker with verbal cues and tactile cues for posture. Patient able to contol descent.     Balance Overall balance assessment: Needs assistance Sitting-balance support: No upper extremity supported, Feet supported Sitting balance-Leahy Scale: Fair     Standing balance support: During functional activity, Reliant on assistive device for balance Standing balance-Leahy Scale: Poor                             ADL either performed or assessed with clinical judgement   ADL   Eating/Feeding: Set up;Sitting Eating/Feeding Details (indicate cue type and reason): set up lunch tray for patient                                        Extremity/Trunk Assessment Upper Extremity Assessment Upper Extremity Assessment: Generalized weakness   Lower Extremity Assessment Lower Extremity Assessment: Generalized weakness  Cervical / Trunk Assessment Cervical / Trunk Assessment: Kyphotic    Vision   Vision Assessment?: No apparent visual deficits   Perception     Praxis      Cognition Arousal/Alertness: Awake/alert Behavior During Therapy: WFL for tasks  assessed/performed Overall Cognitive Status: Within Functional Limits for tasks assessed                                          Exercises      Shoulder Instructions       General Comments      Pertinent Vitals/ Pain       Pain Assessment Pain Assessment: No/denies pain  Home Living                                          Prior Functioning/Environment              Frequency  Min 2X/week        Progress Toward Goals  OT Goals(current goals can now be found in the care plan section)  Progress towards OT goals: Progressing toward goals  Acute Rehab OT Goals Patient Stated Goal: get stronger so he can discharge home OT Goal Formulation: With patient/family Time For Goal Achievement: 05/03/22 Potential to Achieve Goals: Good  Plan Discharge plan remains appropriate    Co-evaluation                 AM-PAC OT "6 Clicks" Daily Activity     Outcome Measure   Help from another person eating meals?: A Little Help from another person taking care of personal grooming?: A Little Help from another person toileting, which includes using toliet, bedpan, or urinal?: Total Help from another person bathing (including washing, rinsing, drying)?: A Lot Help from another person to put on and taking off regular upper body clothing?: A Little Help from another person to put on and taking off regular lower body clothing?: A Lot 6 Click Score: 14    End of Session Equipment Utilized During Treatment: Rolling walker (2 wheels);Gait belt  OT Visit Diagnosis: Unsteadiness on feet (R26.81);Other abnormalities of gait and mobility (R26.89);Muscle weakness (generalized) (M62.81);Other symptoms and signs involving cognitive function;Pain   Activity Tolerance Patient tolerated treatment well   Patient Left with call bell/phone within reach;in bed;with bed alarm set;with family/visitor present   Nurse Communication Mobility status         Time: [id number] OT Time Calculation (min): 18 min  Charges: OT General Charges $OT Visit: 1 Visit OT Treatments $Therapeutic Activity: 8-22 mins  Gustavo Lah, OTR/L [place]  Office [phone]   Lenward Chancellor 04/25/2022, 12:36 PM

## 2022-04-25 NOTE — Progress Notes (Signed)
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  In the emergency department he was febrile, lab work showed leukocytosis, hypercalcemia.CT C/A/P showed small gas collection in the gallbladder fossa following cholecystectomy,potential abscess formation,expansion of LEFT acetabular metastasis into the operator fossa.  Patient was initially admitted with diagnosis of sepsis secondary to biliary infection.  General surgery, IR consulted, started on antibiotics.  Sepsis physiology has improved.  Hospital course complicated by acute confusion, urinary retension, acute left lower extremity DVT.  PT/OT recommending SNF but insurance declined.  Family has appealed , decision pending.  TOC following.  Medically stable for discharge. Has bed available to SNF 3/5.  Subjective: No new complaints.   Objective: BP 138/75 (BP Location: Left Arm)   Pulse 100   Temp 98 F (36.7 C) (Oral)   Resp 19   Ht '5\' 11"'$  (1.803 m)   Wt 95.2 kg   SpO2 98%   BMI 29.27 kg/m   Gen: No distress Pulm: Clear, nonlabored  CV: RRR no MRG GI: Soft, NT, ND, +BS  Neuro: Alert and cooperative. No new focal deficits. Ext: Warm, no deformities. Skin: Right chest port site c/d/I, no rashes, lesions or ulcers on visualized skin   Assessment & Plan: Suspected sepsis: Initially presented with fever, leukocytosis.  There was concern for biliary infection/possible abscess but no definitive source identified.  General surgery, IR were following.  Completed 10 days course of IV antibiotics, resolution of fever.  Negative cultures.  Still has mild leukocytosis most likely secondary to underlying cancer.   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  Mental  status has  improved but remains confused to time, follows commands.  Not agitated   Urine retention/AKI/hematuria: Improved renal function.  Failed voiding trial, patient will be discharged with Foley   Acute left lower extremity DVT: Extensive DVT as per the Doppler including multiple veins.  Case was discussed with oncology, currently on Eliquis .Edema has improved   Scrotal edema: Suspected to be related to volume overload.  Managed with scrotal elevation   Stage IV renal cancer with metastasis: Recent MRI showed brain metastasis.  On Decadron.  Continue Keppra for seizure control.  Oncology recommended palliative care/hospice approach   Normocytic anemia: Hemoglobin dropped to the range of 6 on 2/22 and was transfused unit of PRBC.  Hemoglobin currently in the range of 8.FOBT positive. No report of gross hematochezia or melena.  [facility] consulted, recommended to observe/conservative management. Eliquis  restarted . Iron studies showed low iron, given IV iron   Hypokalemia/hypophosphatemia: Currently being monitored and supplemented as needed   Goals of care: Metastatic lung cancer along with multiple medical problems.  Very poor prognosis.  Has brain metastasis.  Follows with oncology, Dr. Learta Codding who is  recommending palliative  care referral when he returns home.  CODE STATUS is DNR.  Consulted in-house consultation for palliative care .  Plan is to discharge to rehab with ultimate goal of returning home and outpatient palliative care follow-up  Patrecia Pour, MD [facility] [email or web address] 04/25/2022, 4:54 PM

## 2022-04-26 DIAGNOSIS — G9341 Metabolic encephalopathy: Secondary | ICD-10-CM | POA: Diagnosis not present

## 2022-04-26 DIAGNOSIS — C7931 Secondary malignant neoplasm of brain: Secondary | ICD-10-CM | POA: Diagnosis not present

## 2022-04-26 DIAGNOSIS — A419 Sepsis, unspecified organism: Secondary | ICD-10-CM | POA: Diagnosis not present

## 2022-04-26 DIAGNOSIS — R6510 Systemic inflammatory response syndrome (SIRS) of non-infectious origin without acute organ dysfunction: Secondary | ICD-10-CM | POA: Diagnosis not present

## 2022-04-26 DIAGNOSIS — C649 Malignant neoplasm of unspecified kidney, except renal pelvis: Secondary | ICD-10-CM | POA: Diagnosis not present

## 2022-04-26 NOTE — Progress Notes (Signed)
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  In the emergency department he was febrile, lab work showed leukocytosis, hypercalcemia.CT C/A/P showed small gas collection in the gallbladder fossa following cholecystectomy,potential abscess formation,expansion of LEFT acetabular metastasis into the operator fossa.  Patient was initially admitted with diagnosis of sepsis secondary to biliary infection.  General surgery, IR consulted, started on antibiotics.  Sepsis physiology has improved.  Hospital course complicated by acute confusion, urinary retension, acute left lower extremity DVT.  PT/OT recommending SNF but insurance declined.  Family has appealed , decision pending.  TOC following.  Medically stable for discharge. Has bed available to SNF 3/4.  Subjective: No new complaints. Does have some pain that is responsive to oxycodone as ordered.  Objective: BP (!) 119/58 (BP Location: Right Arm)   Pulse 73   Temp 99 F (37.2 C) (Oral)   Resp 16   Ht '5\' 11"'$  (1.803 m)   Wt 95.2 kg   SpO2 97%   BMI 29.27 kg/m   Gen: No distress Pulm: Clear, nonlabored  CV: RRR GI: Soft, NT, ND, +BS   Assessment & Plan: Suspected sepsis: Initially presented with fever, leukocytosis.  There was concern for biliary infection/possible abscess but no definitive source identified.  General surgery, IR were following.  Completed 10 days course of IV antibiotics, resolution of fever.  Negative cultures.  Still has mild leukocytosis most likely secondary to underlying cancer.   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  Mental status has  improved but remains confused to time, follows commands.  Not agitated   Urine  retention/AKI/hematuria: Improved renal function.  - Failed voiding trial so foley replaced 21 days ago. Will DC foley as voiding trial and reinsert if needed. If so, would discharge with foley and would need urology follow up.   Acute left lower extremity DVT: Extensive DVT as per the Doppler including multiple veins.  Case was discussed with oncology, currently on Eliquis .Edema has improved   Scrotal edema: Suspected to be related to volume overload.  Managed with scrotal elevation   Stage IV renal cancer with metastasis: Recent MRI showed brain metastasis.  On Decadron.  Continue Keppra for seizure control.  Oncology recommended palliative care/hospice approach   Normocytic anemia: Hemoglobin dropped to the range of 6 on 2/22 and was transfused unit of PRBC.  Hemoglobin currently in the range of 8.FOBT positive. No report of gross hematochezia or melena.  [facility] consulted, recommended to observe/conservative management. Eliquis restarted . Iron studies showed low iron, given IV iron   Hypokalemia/hypophosphatemia: Currently being monitored and supplemented as needed   Goals of care: Metastatic lung cancer along with multiple medical problems.  Very poor prognosis.  Has brain metastasis.  Follows with oncology, Dr. Learta Codding who is  recommending palliative care referral when he returns home.  CODE STATUS is DNR.  Consulted in-house consultation for palliative care.  Plan is to discharge to rehab with ultimate goal of returning home and outpatient palliative care follow-up  Patrecia Pour, MD [facility] [email or web address] 04/26/2022, 11:46 AM

## 2022-04-26 NOTE — Progress Notes (Signed)
Mobility Specialist - Progress Note   04/26/22 1626  Mobility  Activity Transferred from bed to chair  Level of Assistance +2 (takes two people)  Games developer wheel walker  Distance Ambulated (ft) 2 ft  Activity Response Tolerated fair  $Mobility charge 1 Mobility   Per RN pt wife requesting pt to transfer to recliner chair. Pt requiring max-A for bed mobility and +2 to stand and pivot to recliner chair. Charge RN assisted in transferring pt to recliner chair. At EOS was left on recliner chair with wife in room. RN notified.  Ferd Hibbs Mobility Specialist

## 2022-04-26 NOTE — Progress Notes (Signed)
Palliative Medicine Progress Note   Patient Name: Phillip [name]       Date: 04/26/2022 DOB: [birth year]  Age: 77 y.o. MRN#: [id number] Attending Physician: Patrecia Pour, MD Primary Care Physician: Charlane Ferretti, MD Admit Date: 03/30/2022   HPI/Patient Profile: 77 y.o. 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  Patient was initially admitted with a diagnosis of sepsis secondary to biliary infection.  Hospital course has been complicated by confusion, urinary retention, and acute left lower extremity DVT. PT/OT recommending SNF/rehab but insurance has declined.   Palliative Medicine has been consulted for goals of care.   Subjective: Chart reviewed. Bedside visit with patient and wife/Phillip [name].   Advanced directives, concepts specific to code status, artifical feeding and hydration, and rehospitalization were considered and discussed.  We completed a MOST form today. Phillip [name] and Phillip [name]  outlined their wishes for the following treatment decisions:  Cardiopulmonary Resuscitation: Do Not Attempt Resuscitation (DNR/No CPR)  Medical Interventions: Limited Additional Interventions: Use medical treatment, IV fluids and cardiac monitoring as indicated, DO NOT USE intubation or mechanical ventilation. May consider use of less invasive airway support such as BiPAP or CPAP. Also provide comfort measures. Transfer to the hospital if indicated. Avoid intensive care.   Antibiotics: Antibiotics if indicated  IV Fluids: IV fluids if indicated  Feeding Tube: Feeding tube for a defined trial period     Objective:  Physical Exam Constitutional:      General: He is sleeping. He is not in acute  distress.    Appearance: He is ill-appearing.  Pulmonary:     Effort: Pulmonary effort is normal.  Neurological:     Mental Status: He is easily aroused.             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, lung, bone   Port catheter in place   HTN (hypertension)   Focal seizures ([name])   Seizure disorder ([name])   Acute metabolic encephalopathy   Hypercalcemia   Normocytic anemia   Acute DVT (deep venous thrombosis) ([name])    Recommendations/Plan: Continue current supportive interventions Goal of care - rehab to improve functional status with ultimate goal of returning home MOST form completed and scanned into EMR Outpatient palliative referral - [facility]  Code Status: DNR   Prognosis:  Unable to determine  Discharge Planning: To Be Determined    Thank you for allowing the Palliative Medicine Team to assist in the care of this patient.    Lavena Bullion, NP   Please contact Palliative Medicine Team phone at [phone] for questions and concerns.  For individual providers, please see [facility].

## 2022-04-26 NOTE — Progress Notes (Signed)
Patient foley pulled around 2214. WCTM

## 2022-04-27 ENCOUNTER — Other Ambulatory Visit (HOSPITAL_COMMUNITY): Payer: Self-pay

## 2022-04-27 DIAGNOSIS — R6510 Systemic inflammatory response syndrome (SIRS) of non-infectious origin without acute organ dysfunction: Secondary | ICD-10-CM | POA: Diagnosis not present

## 2022-04-27 LAB — CBC
HCT: 31.4 % — ABNORMAL LOW (ref 39.0–52.0)
Hemoglobin: 9.2 g/dL — ABNORMAL LOW (ref 13.0–17.0)
MCH: 25.3 pg — ABNORMAL LOW (ref 26.0–34.0)
MCHC: 29.3 g/dL — ABNORMAL LOW (ref 30.0–36.0)
MCV: 86.3 fL (ref 80.0–100.0)
Platelets: 305 10*3/uL (ref 150–400)
RBC: 3.64 MIL/uL — ABNORMAL LOW (ref 4.22–5.81)
RDW: 17.9 % — ABNORMAL HIGH (ref 11.5–15.5)
WBC: 14.1 10*3/uL — ABNORMAL HIGH (ref 4.0–10.5)
nRBC: 0 % (ref 0.0–0.2)

## 2022-04-27 LAB — BASIC METABOLIC PANEL
Anion gap: 8 (ref 5–15)
BUN: 11 mg/dL (ref 8–23)
CO2: 28 mmol/L (ref 22–32)
Calcium: 9.8 mg/dL (ref 8.9–10.3)
Chloride: 97 mmol/L — ABNORMAL LOW (ref 98–111)
Creatinine, Ser: 0.55 mg/dL — ABNORMAL LOW (ref 0.61–1.24)
GFR, Estimated: >60 mL/min (ref >60)
Glucose, Bld: 220 mg/dL — ABNORMAL HIGH (ref 70–99)
Potassium: 4.1 mmol/L (ref 3.5–5.1)
Sodium: 133 mmol/L — ABNORMAL LOW (ref 135–145)

## 2022-04-27 NOTE — Progress Notes (Signed)
Nutrition Follow-up  DOCUMENTATION CODES:   Not applicable  INTERVENTION:  - DYS 3 diet.  - Ensure Plus High Protein po once daily, each supplement provides 350 kcal and 20 grams of protein. - Monitor weight trends.  Nutrition status stable at this time, will sign off. Please re-consult if needed.   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  Patient reports eating 2-3 meals a day. Has a great appetite. He is documented to be consuming mostly 75-100% of meals with average of 80% over the past 4 days.  Drinking 1 Ensure daily in addition to meals.  Encouraged patient to continue to eat well and consume supplements.   Nutrition status stable at this time, will sign off.   Medications reviewed and include: Decadron, Iron, Miralax, Senokot  Labs reviewed:  Na 132   Diet Order:   Diet Order             DIET DYS 3 Fluid consistency: Thin  Diet effective now                   EDUCATION NEEDS:  Education needs have been addressed  Skin:  Skin Assessment: Reviewed RN Assessment Skin Integrity Issues:: Stage II Stage II: Mid Sacrum  Last BM:  2/28  Height:  Ht Readings from Last 1 Encounters:  03/30/22 '5\' 11"'$  (1.803 m)   Weight:  Wt Readings from Last 1 Encounters:  04/16/22 95.2 kg    BMI:  Body mass index is 29.27 kg/m.  Estimated Nutritional Needs:  Kcal:  [id number] kcals Protein:  100-120 grams Fluid:  >/=  2.1L    [name] RD, LDN For contact information, refer to [facility]

## 2022-04-27 NOTE — Progress Notes (Signed)
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  In the emergency department he was febrile, lab work showed leukocytosis, hypercalcemia.CT C/A/P showed small gas collection in the gallbladder fossa following cholecystectomy,potential abscess formation,expansion of LEFT acetabular metastasis into the operator fossa.  Patient was initially admitted with diagnosis of sepsis secondary to biliary infection.  General surgery, IR consulted, started on antibiotics.  Sepsis physiology has improved.  Hospital course complicated by acute confusion, urinary retension, acute left lower extremity DVT.  PT/OT recommending SNF but insurance declined.  Family has appealed , decision pending.  TOC following.  Medically stable for discharge. Has bed available to SNF 3/4.  Subjective: No new complaints, asks where I'm from. He's from [place].   Objective: BP 138/64 (BP Location: Left Arm)   Pulse 66   Temp 97.8 F (36.6 C) (Oral)   Resp 16   Ht '5\' 11"'$  (1.803 m)   Wt 95.2 kg   SpO2 96%   BMI 29.27 kg/m   Gen: Pleasant elderly male in no distress Pulm: Clear, nonlabored  CV: RRR, no MRG GI: Soft, NT, ND, +BS Neuro: Alert and interactive without new focal deficits.   Assessment & Plan: Suspected sepsis: Initially presented with fever, leukocytosis.  There was concern for biliary infection/possible abscess but no definitive source identified.  General surgery, IR were following.  Completed 10 days course of IV antibiotics, resolution of fever.  Negative cultures.  Still has mild leukocytosis most likely secondary to underlying cancer.   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  Mental status has  improved but remains confused  to time, follows commands.  Not agitated   Urine retention/AKI/hematuria: Improved renal function.  - Failed voiding trial so foley replaced earlier in hospitalization. Foley pulled 2/29, has required I/O cath once thus far. Cr wnl/stable. Will continue monitoring output and reinsert foley if continues to retain. Plan to DC w/foley in that case and will need urology follow up.   Acute left lower extremity DVT: Extensive DVT as per the Doppler including multiple veins.  Case was discussed with oncology, currently on Eliquis .Edema has improved   Leukocytosis: Stable, afebrile, no evidence of untreated infection. ?if related to malignancy vs. large clot burden DVT.   Scrotal edema: Suspected to be related to volume overload.  Managed with scrotal elevation   Stage IV renal cancer with metastasis: Recent MRI showed brain metastasis.  On Decadron.  Continue Keppra for seizure control.  Oncology recommended palliative care/hospice approach   Iron deficiency anemia: Hemoglobin dropped to the range of 6 on 2/22 and was transfused unit of PRBC. FOBT positive. No report of gross hematochezia or melena.  [facility] consulted, recommended to observe/conservative management. Eliquis restarted, no bleeding, hgb stable. - Iron studies showed low iron, given IV iron  - Hgb stable, 9.2g/dl at last check.   Hypokalemia/hypophosphatemia: Currently being monitored and supplemented as needed  Hyponatremia: Improving.   Goals of care: Metastatic lung cancer along with multiple medical problems.  Very poor prognosis.  Has brain metastasis.  Follows with oncology, Dr. Learta Codding who is  recommending palliative care referral when he returns home.  CODE STATUS is DNR.  Consulted in-house consultation for palliative care.  Plan is to discharge to rehab with ultimate goal of returning home  and outpatient palliative care follow-up  Patrecia Pour, MD [facility] [email or web address] 04/27/2022, 11:26 AM

## 2022-04-27 NOTE — Progress Notes (Signed)
Coude catheter placed for urinary retention.

## 2022-04-27 NOTE — Care Management Important Message (Signed)
Important Message  Patient Details IM Letter given Name: Phillip [name] MRN: [id number] Date of Birth: 10-Aug-1945   Medicare Important Message Given:  Yes     [name] 04/27/2022, 10:34 AM

## 2022-04-27 NOTE — Progress Notes (Signed)
Attempted to place foley catheter per order, unsuccessful. Coude catheter will be needed. Reaching out to RN specialized in coude catheter insertion.

## 2022-04-27 NOTE — Progress Notes (Signed)
Patient in and out cath perfromed around 0300. 500 ml urine out

## 2022-04-28 ENCOUNTER — Inpatient Hospital Stay (HOSPITAL_COMMUNITY): Payer: PPO

## 2022-04-28 DIAGNOSIS — R6510 Systemic inflammatory response syndrome (SIRS) of non-infectious origin without acute organ dysfunction: Secondary | ICD-10-CM | POA: Diagnosis not present

## 2022-04-28 MED ORDER — ALBUTEROL SULFATE (2.5 MG/3ML) 0.083% IN NEBU
2.5000 mg | INHALATION_SOLUTION | RESPIRATORY_TRACT | Status: DC | PRN
  Administered 2022-04-30: 2.5 mg via RESPIRATORY_TRACT
  Filled 2022-04-28: qty 3

## 2022-04-28 NOTE — Progress Notes (Signed)
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  In the emergency department he was febrile, lab work showed leukocytosis, hypercalcemia.CT C/A/P showed small gas collection in the gallbladder fossa following cholecystectomy,potential abscess formation,expansion of LEFT acetabular metastasis into the operator fossa.  Patient was initially admitted with diagnosis of sepsis secondary to biliary infection.  General surgery, IR consulted, started on antibiotics.  Sepsis physiology has improved.  Hospital course complicated by acute confusion, urinary retension, acute left lower extremity DVT.  PT/OT recommending SNF but insurance declined.  Family has appealed , decision pending.  TOC following.  Medically stable for discharge. Has bed available to SNF 3/4.  Subjective: No new complaints, asks where I'm from. He's from [place].   Objective: BP 123/74 (BP Location: Right Arm)   Pulse (!) 107   Temp 98.3 F (36.8 C) (Oral)   Resp 18   Ht '5\' 11"'$  (1.803 m)   Wt 95.2 kg   SpO2 95%   BMI 29.27 kg/m   No distress, calm, interactive Clear Stable edema abd soft, NT, ND, +BS. No visible or palpable abnormality of right thigh. Bony hypertrophy without joint effusion or erythema at right knee. Mild joint line tenderness.   Assessment & Plan: Suspected sepsis: Initially presented with fever, leukocytosis.  There was concern for biliary infection/possible abscess but no definitive source identified.  General surgery, IR were following.  Completed 10 days course of IV antibiotics, resolution of fever.  Negative cultures.  Still has mild leukocytosis most likely secondary to underlying cancer.   Acute metabolic encephalopathy: Has brain metastasis from  renal cancer.  Mental status has  improved but remains confused to time, follows commands.  Not agitated   Urine retention/AKI/hematuria: Improved renal function.  - Failed voiding trial so foley replaced earlier in hospitalization. Foley pulled 2/29, has required I/O cath once thus far. Cr wnl/stable. Will continue monitoring output and reinsert foley if continues to retain. Plan to DC w/foley in that case and will need urology follow up.   Acute left lower extremity DVT: Extensive DVT as per the Doppler including multiple veins.  Case was discussed with oncology, currently on Eliquis .Edema has improved   Leukocytosis: Stable, afebrile, no evidence of untreated infection. ?if related to malignancy vs. large clot burden DVT.   Scrotal edema: Suspected to be related to volume overload.  Managed with scrotal elevation   Stage IV renal cancer with metastasis: Recent MRI showed brain metastasis.  On Decadron.  Continue Keppra for seizure control.  Oncology recommended palliative care/hospice approach   Iron deficiency anemia: Hemoglobin dropped to the range of 6 on 2/22 and was transfused unit of PRBC. FOBT positive. No report of gross hematochezia or melena.  [facility] consulted, recommended to observe/conservative management. Eliquis restarted, no bleeding, hgb stable. - Iron studies showed low iron, given IV iron  - Hgb stable, 9.2g/dl at last check.   Hypokalemia/hypophosphatemia: Currently being monitored and supplemented as needed  Hyponatremia: Improving.  Osteoarthritis: Right thigh/knee pain is noted, chronic. XR ordered and confirms tricompartmental OA. Continue pain meds as ordered.   Goals of care: Metastatic lung cancer along with multiple medical problems. Very poor prognosis.  Has brain metastasis.  Follows with oncology, Dr. Learta Codding who is  recommending palliative care referral when he returns home.  CODE STATUS is DNR. Consulted in-house consultation for palliative care.  Plan  is to discharge to rehab with ultimate goal of returning home and outpatient palliative care follow-up  Patrecia Pour, MD [facility] [email or web address] 04/28/2022, 5:00 PM

## 2022-04-29 DIAGNOSIS — R6510 Systemic inflammatory response syndrome (SIRS) of non-infectious origin without acute organ dysfunction: Secondary | ICD-10-CM | POA: Diagnosis not present

## 2022-04-29 MED ORDER — ALBUTEROL SULFATE (2.5 MG/3ML) 0.083% IN NEBU
2.5000 mg | INHALATION_SOLUTION | Freq: Once | RESPIRATORY_TRACT | Status: AC
  Administered 2022-04-29: 2.5 mg via RESPIRATORY_TRACT
  Filled 2022-04-29: qty 3

## 2022-04-29 NOTE — Progress Notes (Signed)
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  In the emergency department he was febrile, lab work showed leukocytosis, hypercalcemia.CT C/A/P showed small gas collection in the gallbladder fossa following cholecystectomy,potential abscess formation,expansion of LEFT acetabular metastasis into the operator fossa.  Patient was initially admitted with diagnosis of sepsis secondary to biliary infection.  General surgery, IR consulted, started on antibiotics.  Sepsis physiology has improved.  Hospital course complicated by acute confusion, urinary retension, acute left lower extremity DVT.  PT/OT recommending SNF but insurance declined.  Family has appealed, decision pending. TOC following. Medically stable for discharge. Has bed available to SNF 3/4.  Subjective: No new complaints today. Ready to eat breakfast. Doesn't feel very short of breath but would try a neb treatment to see if he feels better (asked due to some squeaks on exam)  Objective: BP (!) 127/57 (BP Location: Right Wrist)   Pulse 85   Temp 97.7 F (36.5 C) (Oral)   Resp 18   Ht '5\' 11"'$  (1.803 m)   Wt 95.2 kg   SpO2 100%   BMI 29.27 kg/m   Calm frail male in no distress Squeaks on exam without any tachypnea or actual crackles or wheezes.  RRR No new rashes or deformities on exam.  Scrotal swelling/hydrocele improved from prior  Assessment & Plan: Suspected sepsis: Initially presented with fever, leukocytosis.  There was concern for biliary infection/possible abscess but no definitive source identified.  General surgery, IR were following.  Completed 10 days course of IV antibiotics, resolution of fever.  Negative cultures.  Still has mild leukocytosis most likely secondary to  underlying cancer.   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  Mental status has  improved but remains confused to time, follows commands.  Not agitated   Urine retention/AKI/hematuria: Improved renal function.  - Failed voiding trial so foley replaced earlier in hospitalization. Foley pulled 2/29, has required I/O cath once thus far. Cr wnl/stable. Will continue monitoring output and reinsert foley if continues to retain. Plan to DC w/foley in that case and will need urology follow up.   Acute left lower extremity DVT: Extensive DVT as per the Doppler including multiple veins.  Case was discussed with oncology, currently on Eliquis .Edema has improved   Leukocytosis: Stable, afebrile, no evidence of untreated infection. ?if related to malignancy vs. large clot burden DVT.   Abnormal lung exam: Some squeaks without respiratory distress.  - Trial albuterol neb x1.   Scrotal edema: Suspected to be related to volume overload.  Managed with scrotal elevation, improving.   Stage IV renal cancer with metastasis: Recent MRI showed brain metastasis.  On Decadron.  Continue Keppra for seizure control.  Oncology recommended palliative care/hospice approach   Iron deficiency anemia: Hemoglobin dropped to the range of 6 on 2/22 and was transfused unit of PRBC. FOBT positive. No report of gross hematochezia or melena.  [facility] consulted, recommended to observe/conservative management. Eliquis restarted, no bleeding, hgb stable. - Iron studies showed low iron, given IV iron  - Hgb stable, 9.2g/dl at last check.   Hypokalemia/hypophosphatemia: Currently being monitored and supplemented as needed  Hyponatremia: Improving.  Osteoarthritis: Right thigh/knee pain is noted, chronic. XR ordered and confirms tricompartmental OA. Continue pain meds as ordered.   Goals of  care: Metastatic lung cancer along with multiple medical problems. Very poor prognosis.  Has brain  metastasis.  Follows with oncology, Dr. Learta Codding who is  recommending palliative care referral when he returns home. CODE STATUS is DNR. Consulted in-house consultation for palliative care.  Plan is to discharge to rehab with ultimate goal of returning home and outpatient palliative care follow-up  Patrecia Pour, MD [facility] [email or web address] 04/29/2022, 8:40 AM

## 2022-04-30 DIAGNOSIS — R6510 Systemic inflammatory response syndrome (SIRS) of non-infectious origin without acute organ dysfunction: Secondary | ICD-10-CM | POA: Diagnosis not present

## 2022-04-30 MED ORDER — HEPARIN SOD (PORK) LOCK FLUSH 100 UNIT/ML IV SOLN
500.0000 [IU] | INTRAVENOUS | Status: AC | PRN
  Administered 2022-04-30: 500 [IU]
  Filled 2022-04-30: qty 5

## 2022-04-30 MED ORDER — OXYCODONE HCL ER 10 MG PO T12A: 20.0000 mg | EXTENDED_RELEASE_TABLET | Freq: Two times a day (BID) | ORAL | 0 refills | Status: DC

## 2022-04-30 MED ORDER — OXYCODONE HCL 5 MG PO TABS: 5.0000 mg | ORAL_TABLET | Freq: Four times a day (QID) | ORAL | 0 refills | Status: DC | PRN

## 2022-04-30 MED ORDER — APIXABAN 5 MG PO TABS: 5.0000 mg | ORAL_TABLET | Freq: Two times a day (BID) | ORAL | 0 refills | Status: DC

## 2022-04-30 MED ORDER — ENSURE ENLIVE PO LIQD: 237.0000 mL | ORAL | 12 refills | Status: DC

## 2022-04-30 NOTE — Plan of Care (Signed)
  Problem: Fluid Volume: Goal: Hemodynamic stability will improve Outcome: Completed/Met   Problem: Clinical Measurements: Goal: Diagnostic test results will improve Outcome: Completed/Met Goal: Signs and symptoms of infection will decrease Outcome: Completed/Met   Problem: Respiratory: Goal: Ability to maintain adequate ventilation will improve Outcome: Completed/Met   Problem: Education: Goal: Knowledge of General Education information will improve Description: Including pain rating scale, medication(s)/side effects and non-pharmacologic comfort measures Outcome: Completed/Met   Problem: Health Behavior/Discharge Planning: Goal: Ability to manage health-related needs will improve Outcome: Completed/Met   Problem: Clinical Measurements: Goal: Ability to maintain clinical measurements within normal limits will improve Outcome: Completed/Met Goal: Will remain free from infection Outcome: Completed/Met Goal: Diagnostic test results will improve Outcome: Completed/Met Goal: Respiratory complications will improve Outcome: Completed/Met Goal: Cardiovascular complication will be avoided Outcome: Completed/Met   Problem: Activity: Goal: Risk for activity intolerance will decrease Outcome: Completed/Met   Problem: Nutrition: Goal: Adequate nutrition will be maintained Outcome: Completed/Met   Problem: Coping: Goal: Level of anxiety will decrease Outcome: Completed/Met   Problem: Elimination: Goal: Will not experience complications related to bowel motility Outcome: Completed/Met Goal: Will not experience complications related to urinary retention Outcome: Completed/Met   Problem: Pain Managment: Goal: General experience of comfort will improve Outcome: Completed/Met   Problem: Safety: Goal: Ability to remain free from injury will improve Outcome: Completed/Met   Problem: Skin Integrity: Goal: Risk for impaired skin integrity will decrease Outcome:  Completed/Met   

## 2022-04-30 NOTE — Progress Notes (Signed)
RN called report to Rayne about patient at [phone]. [facility] has been called to come and get patient.   [facility] arrived to get patient. RN and NT cleaned patient up to get ready for discharge to leave with [facility] to go to [facility]. Patient was put into a disposable gown, Foley and peri care was completed, patient was wiped down with CHG wipes, Foley was emptied, vitals were taken, and patient's foam dressings on buttocks/sacrum were changed. Patient did not have any belongings at the bedside to return. Patient's wife was notified that [facility] had come to pick patient up to take patient to [facility]. Patient was stable and alert upon discharge.

## 2022-04-30 NOTE — Discharge Summary (Signed)
Physician Discharge Summary   Patient: Phillip [name] MRN: [id number] DOB: August 23, 1945  Admit date:     03/30/2022  Discharge date: 04/30/22  Discharge Physician: Patrecia Pour   PCP: Charlane Ferretti, MD   Recommendations at discharge:   Continue foley at discharge, recommend urology follow up if unable to pass voiding trial.  Palliative care should continue following the patient at the facility to support family in decision making Re: transition to comfort measures.  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  In the emergency department he was febrile, lab work showed leukocytosis, hypercalcemia.CT C/A/P showed small gas collection in the gallbladder fossa following cholecystectomy,potential abscess formation,expansion of LEFT acetabular metastasis into the operator fossa.  Patient was initially admitted with diagnosis of sepsis secondary to biliary infection.  General surgery, IR consulted, started on antibiotics.  Sepsis physiology has improved.  Hospital course complicated by acute confusion, urinary retension, acute left lower extremity DVT.  PT/OT recommending SNF but insurance declined.  Family will provide funds for facility placement, will need palliative care following. Medically stable for discharge. Has bed available to SNF 3/4.   Assessment and Plan: Sepsis ruled out: Initially presented with fever, leukocytosis.  There was concern for biliary  infection/possible abscess but no definitive source identified.  General surgery, IR were following.  Completed 10 days course of IV antibiotics, resolution of fever.  Negative cultures.  Still has mild leukocytosis most likely secondary to underlying cancer.   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  Mental status has  improved but remains confused to time, follows commands.  Not agitated   Urine retention/AKI/hematuria: Improved renal function.  - Failed voiding trial again 2/29. Plan to DC w/foley which was replaced 3/1. Will need urology follow up.   Acute left lower extremity DVT: Extensive DVT as per the Doppler including multiple veins.  Case was discussed with oncology, currently on Eliquis. Edema has improved   Leukocytosis: Stable, afebrile, no evidence of untreated infection. ?if related to malignancy vs. large clot burden DVT.    Abnormal lung exam: Some squeaks without respiratory distress.  - Trial albuterol neb x1.    Scrotal edema: Suspected to be related to volume overload.  Managed with scrotal elevation, improving.   Stage IV renal cancer with metastasis: Recent MRI showed brain metastasis.  On Decadron.  Continue Keppra for seizure control.  Oncology recommended palliative care/hospice approach   Iron deficiency anemia: Hemoglobin dropped to the range of 6 on 2/22 and was transfused unit of PRBC. FOBT positive. No report of gross hematochezia or melena.  [facility] consulted, recommended to observe/conservative management. Eliquis restarted, no bleeding, hgb stable. - Iron studies showed low iron, given IV iron  - Hgb stable, 9.2g/dl at last check.   Hypokalemia/hypophosphatemia: Currently being monitored and supplemented as needed  HTN:  Stopped home losartan-HCTZ due to low BP for now.    Hyponatremia: Improving.   Osteoarthritis: Right thigh/knee pain is noted, chronic. XR ordered  and confirms tricompartmental OA. Continue pain meds  as ordered.   Goals of care: Metastatic lung cancer along with multiple medical problems. Very poor prognosis.  Has brain metastasis.  Follows with oncology, Dr. Learta Codding who is recommending palliative care referral when he returns home. CODE STATUS is DNR. Consulted in-house consultation for palliative care.  Plan is to discharge to rehab with ultimate goal of returning home and outpatient palliative care follow-up  Consultants: GI, IR, oncology, palliative care Procedures performed: Foley catheter  Disposition: Skilled nursing facility Diet recommendation: Dysphagia 3 DISCHARGE MEDICATION: Allergies as of 04/30/2022       Reactions   Contrast Media [iodinated Contrast Media] Rash        Medication List     STOP taking these medications    losartan-hydrochlorothiazide 100-12.5 MG tablet Commonly known as: HYZAAR       TAKE these medications    apixaban 5 MG Tabs tablet Commonly known as: ELIQUIS Take 1 tablet (5 mg total) by mouth 2 (two) times daily.   Colace 100 MG capsule Generic drug: docusate sodium Take 200 mg by mouth 2 (two) times daily.   dexamethasone 4 MG tablet Commonly known as: DECADRON Take 1 tablet (4 mg total) by mouth daily. Take for 14 days   dorzolamide 2 % ophthalmic solution Commonly known as: TRUSOPT Instill 1 drop into right eye three times daily.   feeding supplement Liqd Take 237 mLs by mouth daily.   ferrous sulfate 325 (65 FE) MG tablet Take 1 tablet (325 mg total) by mouth daily with breakfast.   levETIRAcetam 500 MG tablet Commonly known as: KEPPRA Take 1 tablet (500 mg total) by mouth 2 (two) times daily. What changed: how much to take   lidocaine-prilocaine cream Commonly known as: EMLA APPLY TO PORTACATH 1 HOUR PRIOR TO USE AS NEEDED What changed:  how much to take how to take this when to take this reasons to take this   mirtazapine 30 MG tablet Commonly known as: REMERON Take 1 tablet (30 mg total) by mouth at  bedtime.   ondansetron 8 MG tablet Commonly known as: ZOFRAN Take 1 tablet (8 mg total) by mouth every 8 (eight) hours as needed for nausea for vomiting.   oxyCODONE 10 mg 12 hr tablet Commonly known as: OXYCONTIN Take 2 tablets (20 mg total) by mouth every 12 (twelve) hours. What changed:  how much to take when to take this additional instructions   oxyCODONE 5 MG immediate release tablet Commonly known as: Oxy IR/ROXICODONE Take 1 tablet (5 mg total) by mouth every 6 (six) hours as needed for severe pain. What changed: Another medication with the same name was changed. Make sure you understand how and when to take each.   polyethylene glycol 17 g packet Commonly known as: MIRALAX / GLYCOLAX Take 17 g by mouth daily as needed for moderate constipation.   prochlorperazine 10 MG tablet Commonly known as: COMPAZINE Take 1 tablet by mouth every 6 hours as needed for nausea or vomiting.   saccharomyces boulardii 250 MG capsule Commonly known as: FLORASTOR Take 250 mg by mouth 2 (two) times daily.   Testosterone 20.25 MG/ACT (1.62%) Gel Apply 2 Pumps topically daily.   Vitamin B12 1000 MCG Tbcr Take 1,000 mcg by mouth daily.   Vitamin D 50 MCG (2000 UT) Caps Take 2,000 Units by mouth daily.        Contact information for after-discharge care     Destination     [facility] .   Service: Skilled Nursing Contact information: [place] [phone]                    Discharge Exam: Danley Danker Weights   03/30/22 1029 04/16/22 0500  Weight: 102.1 kg 95.2 kg  BP (!) 141/67 (BP Location: Left Arm)   Pulse 77   Temp 97.6 F (36.4 C) (Oral)   Resp 17   Ht '5\' 11"'$  (1.803 m)   Wt 95.2 kg   SpO2 98%   BMI 29.27 kg/m   Chronically ill/frail-appearing male in no distress, pleasant Clear, nonlabored RRR, no MRG, trace edema (improved) +BS, soft, NT  Condition at discharge: stable  The results of significant  diagnostics from this hospitalization (including imaging, microbiology, ancillary and laboratory) are listed below for reference.   Imaging Studies: DG Knee Right Port  Result Date: 04/28/2022 CLINICAL DATA:  Right knee pain. EXAM: PORTABLE RIGHT KNEE - 1-2 VIEW COMPARISON:  None Available. FINDINGS: No acute or evidence of healing/healed fracture. The alignment is normal. There is medial tibiofemoral and patellofemoral joint space narrowing. Mild to moderate tricompartmental peripheral spurring. Quadriceps tendon enthesophyte. Sherald Barge is noted. No erosion or bony destructive change. No significant knee joint effusion. There are vascular calcifications. IMPRESSION: Tricompartmental osteoarthritis, moderate in degree. No acute osseous abnormalities. Electronically Signed   By: Keith Rake M.D.   On: 04/28/2022 10:33   VAS US AORTA/IVC/ILIACS  Result Date: 04/06/2022 IVC/ILIAC STUDY Patient Name:  Daveed Zabaleta  Date of Exam:   04/06/2022 Medical Rec #: [id number]           Accession #:    [id number] Date of Birth: 1945-10-11            Patient Gender: M Patient Age:   36 years Exam Location:  [facility] Procedure:      VAS US AORTA/IVC/ILIACS Referring Phys: Niel Hummer --------------------------------------------------------------------------------  Indications: DVT of LLE Other Factors: Renal cell carcinoma & immobility. Limitations: Obesity and air/bowel gas - patient not NPO.  Comparison Study: No previous exams Performing Technologist: Jody Hill RVT, RDMS  Examination Guidelines: A complete evaluation includes B-mode imaging, spectral Doppler, color Doppler, and power Doppler as needed of all accessible portions of each vessel. Bilateral testing is considered an integral part of a complete examination. Limited examinations for reoccurring indications may be performed as noted.  IVC/Iliac Findings: +----------+------+--------+--------------+    IVC    PatentThrombus   Comments     +----------+------+--------+--------------+ IVC Prox                not visualized +----------+------+--------+--------------+ IVC Mid   patent                       +----------+------+--------+--------------+ IVC Distalpatent                       +----------+------+--------+--------------+  +-------------------+---------+-----------+---------+-----------+--------------+         CIV        RT-PatentRT-ThrombusLT-PatentLT-Thrombus   Comments    +-------------------+---------+-----------+---------+-----------+--------------+ Common Iliac Prox                       patent                            +-------------------+---------+-----------+---------+-----------+--------------+ Common Iliac Mid  not visualized +-------------------+---------+-----------+---------+-----------+--------------+ Common Iliac Distal                                        not visualized +-------------------+---------+-----------+---------+-----------+--------------+  +-------------------+---------+-----------+---------+-----------------+--------+         EIV        RT-PatentRT-ThrombusLT-Patent   LT-Thrombus   Comments +-------------------+---------+-----------+---------+-----------------+--------+ External Iliac Vein                             age indeterminate         Prox                                                                      +-------------------+---------+-----------+---------+-----------------+--------+ External Iliac Vein                             age indeterminate         Mid                                                                       +-------------------+---------+-----------+---------+-----------------+--------+ External Iliac Vein                             age indeterminate         Distal                                                                     +-------------------+---------+-----------+---------+-----------------+--------+  Continuous flow seen throughout external iliac vein.  Summary: IVC/Iliac: There is no evidence of thrombus involving the IVC. There is no evidence of thrombus involving the left proximal common iliac vein. There is evidence of age indeterminate thrombus involving the left external iliac vein. Proximal Inferior Vena Cava, distal common Iliac and mid common Iliac were not visualized on this exam.  *See table(s) above for measurements and observations.  Electronically signed by Harold Barban MD on 04/06/2022 at 9:28:24 PM.    Final    VAS [name] LOWER EXTREMITY VENOUS (DVT)  Result Date: 04/06/2022  Lower Venous DVT Study Patient Name:  CAMILLO [facility]  Date of Exam:   04/06/2022 Medical Rec #: [id number]           Accession #:    [id number] Date of Birth: 06/24/1945            Patient Gender: M Patient Age:   14 years Exam Location:  [facility] Procedure:      VAS [name] LOWER EXTREMITY VENOUS (DVT) Referring Phys: Jerald Kief REGALADO --------------------------------------------------------------------------------  Indications: Edema.  Risk  Factors: Immobility Cancer (renal cell carcinoma). Limitations: Poor ultrasound/tissue interface. Comparison Study: No previous exams Performing Technologist: Jody Hill RVT, RDMS  Examination Guidelines: A complete evaluation includes B-mode imaging, spectral Doppler, color Doppler, and power Doppler as needed of all accessible portions of each vessel. Bilateral testing is considered an integral part of a complete examination. Limited examinations for reoccurring indications may be performed as noted. The reflux portion of the exam is performed with the patient in reverse Trendelenburg.  +-----+---------------+---------+-----------+----------+--------------+ RIGHTCompressibilityPhasicitySpontaneityPropertiesThrombus Aging +-----+---------------+---------+-----------+----------+--------------+ CFV   Full           Yes      Yes                                 +-----+---------------+---------+-----------+----------+--------------+   +---------+---------------+---------+-----------+---------------+--------------+ LEFT     CompressibilityPhasicitySpontaneityProperties     Thrombus Aging +---------+---------------+---------+-----------+---------------+--------------+ CFV      None           No       Yes        continuous flowAcute          +---------+---------------+---------+-----------+---------------+--------------+ SFJ      Partial                                           Acute          +---------+---------------+---------+-----------+---------------+--------------+ FV Prox  None           No       No                        Acute          +---------+---------------+---------+-----------+---------------+--------------+ FV Mid   None           No       No                        Acute          +---------+---------------+---------+-----------+---------------+--------------+ FV DistalNone           No       No                        Acute          +---------+---------------+---------+-----------+---------------+--------------+ PFV      None           No       No                        Acute          +---------+---------------+---------+-----------+---------------+--------------+ POP      None           No       No                        Acute          +---------+---------------+---------+-----------+---------------+--------------+ PTV      None           No       No                        Acute          +---------+---------------+---------+-----------+---------------+--------------+  PERO     None           No       No                        Acute          +---------+---------------+---------+-----------+---------------+--------------+ Gastroc  None           No       No                        Acute           +---------+---------------+---------+-----------+---------------+--------------+    Summary: RIGHT: - No evidence of common femoral vein obstruction.  LEFT: - Findings consistent with acute deep vein thrombosis involving the left common femoral vein, SF junction, left femoral vein, left proximal profunda vein, left popliteal vein, left posterior tibial veins, left peroneal veins, and left gastrocnemius veins. - There is no evidence of superficial venous thrombosis.  - No cystic structure found in the popliteal fossa.  *See table(s) above for measurements and observations. Electronically signed by Harold Barban MD on 04/06/2022 at 9:26:00 PM.    Final    CT ABDOMEN PELVIS WO CONTRAST  Result Date: 04/05/2022 CLINICAL DATA:  Sepsis workup. History of renal cell carcinoma with lumbar metastases. EXAM: CT ABDOMEN AND PELVIS WITHOUT CONTRAST TECHNIQUE: Multidetector CT imaging of the abdomen and pelvis was performed following the standard protocol without IV contrast. RADIATION DOSE REDUCTION: This exam was performed according to the departmental dose-optimization program which includes automated exposure control, adjustment of the mA and/or kV according to patient size and/or use of iterative reconstruction technique. COMPARISON:  03/30/2022 and 01/08/2022, 02/08/2020 FINDINGS: Lower chest: Central venous catheter tip over the right cavoatrial junction. Mild opacification over the right lower lobe likely scarring/atelectasis worse compared to the recent prior exam. Heart is normal size. Hepatobiliary: Previous cholecystectomy. Stable 1.5 cm hypodensity of the central left lobe of the liver as this is unchanged from 02/08/2020 and likely a cyst. Biliary tree is unremarkable. Pancreas: Normal. Spleen: Normal. Adrenals/Urinary Tract: Right adrenal gland is normal. 1.3 cm left adrenal nodule unchanged from 03/30/2022 and indeterminate as this was not well visualized on the previous study from 01/08/2022. Kidneys are normal  in size. No nephrolithiasis or focal mass. No significant hydronephrosis. Ureters are normal. Foley catheter is present within a decompressed bladder. Stomach/Bowel: Stomach and small bowel are normal. Appendix is not visualized. Colon is unremarkable. Vascular/Lymphatic: Mild calcified plaque of the abdominal aorta which is normal in caliber. A few small nodes along the left iliac chain unchanged. Reproductive: Normal. Other: Nausea findings edema over the lower abdomen/pelvis. Very minimal patchy free fluid over the lower abdomen/pelvis. No significant focal inflammatory change. Musculoskeletal: Multiple destructive lytic bone lesions of the pelvis and spine without significant change and compatible with known metastatic disease. IMPRESSION: 1. No acute findings in the abdomen/pelvis. 2. Mild opacification over the right lower lobe likely scarring/atelectasis worse compared to the recent prior exam. 3. 1.3 cm left adrenal nodule unchanged from 03/30/2022 and and may represent metastatic disease as this was not well visualized on the previous study from 01/08/2022. Recommend attention on follow-up. 4. Multiple destructive lytic bone lesions of the pelvis and spine without significant change and compatible with known osseous metastatic disease. 5. 1.5 cm hypodensity over the central left lobe of the liver unchanged from 02/08/2020 and likely a cyst. 6. Foley catheter within a decompressed  bladder. 7. Aortic atherosclerosis. Aortic Atherosclerosis (ICD10-I70.0). Electronically Signed   By: Marin Olp M.D.   On: 04/05/2022 16:16   DG Chest 2 View  Result Date: 04/05/2022 CLINICAL DATA:  Leukocytosis EXAM: CHEST - 2 VIEW COMPARISON:  03/30/2022 FINDINGS: Port in the anterior chest wall with tip in distal SVC. Normal mediastinum and cardiac silhouette. Normal pulmonary vasculature. No evidence of effusion, infiltrate, or pneumothorax. No acute bony abnormality. IMPRESSION: No acute cardiopulmonary process.  Electronically Signed   By: Suzy Bouchard M.D.   On: 04/05/2022 13:14    Microbiology: Results for orders placed or performed during the hospital encounter of 03/30/22  Blood Culture (routine x 2)     Status: None   Collection Time: 03/30/22 10:48 AM   Specimen: BLOOD LEFT FOREARM  Result Value Ref Range Status   Specimen Description BLOOD LEFT FOREARM  Final   Special Requests   Final    BOTTLES DRAWN AEROBIC AND ANAEROBIC Blood Culture adequate volume   Culture   Final    NO GROWTH 5 DAYS Performed at [facility], [place] [address], [place], [place] [address]    Report Status 04/04/2022 FINAL  Final  Blood Culture (routine x 2)     Status: None   Collection Time: 03/30/22 11:10 AM   Specimen: BLOOD  Result Value Ref Range Status   Specimen Description BLOOD SITE NOT SPECIFIED  Final   Special Requests   Final    BOTTLES DRAWN AEROBIC AND ANAEROBIC Blood Culture adequate volume   Culture   Final    NO GROWTH 5 DAYS Performed at [facility], [place] [address], [place], [place] [address]    Report Status 04/04/2022 FINAL  Final  Resp panel by RT-PCR (RSV, Flu A&B, Covid) Anterior Nasal Swab     Status: None   Collection Time: 03/30/22 11:43 AM   Specimen: Anterior Nasal Swab  Result Value Ref Range Status   SARS Coronavirus 2 by RT PCR NEGATIVE NEGATIVE Final    Comment: (NOTE) SARS-CoV-2 target nucleic acids are NOT DETECTED.  The SARS-CoV-2 RNA is generally detectable in upper respiratory specimens during the acute phase of infection. The lowest concentration of SARS-CoV-2 viral copies this assay can detect is 138 copies/mL. A negative result does not preclude SARS-Cov-2 infection and should not be used as the sole basis for treatment or other patient management decisions. A negative result may occur with  improper specimen collection/handling, submission of specimen other than nasopharyngeal swab, presence of viral mutation(s) within the areas targeted by this  assay, and inadequate number of viral copies(<138 copies/mL). A negative result must be combined with clinical observations, patient history, and epidemiological information. The expected result is Negative.  Fact Sheet for Patients:  EntrepreneurPulse.com.au  Fact Sheet for Healthcare Providers:  IncredibleEmployment.be  This test is no t yet approved or cleared by the [place] FDA and  has been authorized for detection and/or diagnosis of SARS-CoV-2 by FDA under an Emergency Use Authorization (EUA). This EUA will remain  in effect (meaning this test can be used) for the duration of the COVID-19 declaration under Section 564(b)(1) of the Act, 21 U.S.C.section 360bbb-3(b)(1), unless the authorization is terminated  or revoked sooner.       Influenza A by PCR NEGATIVE NEGATIVE Final   Influenza B by PCR NEGATIVE NEGATIVE Final    Comment: (NOTE) The Xpert Xpress SARS-CoV-2/FLU/RSV plus assay is intended as an aid in the diagnosis of influenza from Nasopharyngeal swab  specimens and should not be used as a sole basis for treatment. Nasal washings and aspirates are unacceptable for Xpert Xpress SARS-CoV-2/FLU/RSV testing.  Fact Sheet for Patients: EntrepreneurPulse.com.au  Fact Sheet for Healthcare Providers: IncredibleEmployment.be  This test is not yet approved or cleared by the [place] FDA and has been authorized for detection and/or diagnosis of SARS-CoV-2 by FDA under an Emergency Use Authorization (EUA). This EUA will remain in effect (meaning this test can be used) for the duration of the COVID-19 declaration under Section 564(b)(1) of the Act, 21 U.S.C. section 360bbb-3(b)(1), unless the authorization is terminated or revoked.     Resp Syncytial Virus by PCR NEGATIVE NEGATIVE Final    Comment: (NOTE) Fact Sheet for Patients: EntrepreneurPulse.com.au  Fact Sheet for  Healthcare Providers: IncredibleEmployment.be  This test is not yet approved or cleared by the [place] FDA and has been authorized for detection and/or diagnosis of SARS-CoV-2 by FDA under an Emergency Use Authorization (EUA). This EUA will remain in effect (meaning this test can be used) for the duration of the COVID-19 declaration under Section 564(b)(1) of the Act, 21 U.S.C. section 360bbb-3(b)(1), unless the authorization is terminated or revoked.  Performed at [facility], [place] [address] [place], [place] [address]    *Note: Due to a large number of results and/or encounters for the requested time period, some results have not been displayed. A complete set of results can be found in Results Review.    Labs: CBC: Recent Labs  Lab 04/24/22 0755 04/27/22 0925  WBC 13.8* 14.1*  HGB 8.6* 9.2*  HCT 28.9* 31.4*  MCV 86.5 86.3  PLT [id number]   Basic Metabolic Panel: Recent Labs  Lab 04/24/22 0755 04/27/22 0925  NA 132* 133*  K 4.0 4.1  CL 96* 97*  CO2 28 28  GLUCOSE 140* 220*  BUN 13 11  CREATININE 0.51* 0.55*  CALCIUM 9.6 9.8   Liver Function Tests: No results for input(s): "AST", "ALT", "ALKPHOS", "BILITOT", "PROT", "ALBUMIN" in the last 168 hours. CBG: No results for input(s): "GLUCAP" in the last 168 hours.  Discharge time spent: greater than 30 minutes.  Signed: Patrecia Pour, MD [facility] 04/30/2022

## 2022-04-30 NOTE — TOC Transition Note (Signed)
Transition of Care [facility]) - CM/SW Discharge Note   Patient Details  Name: Phillip [name] MRN: [id number] Date of Birth: 05-16-1945  [facility] CM/SW Contact:  Illene Regulus, LCSW Phone Number: 04/30/2022, 11:43 AM   Clinical Narrative:    Pt to d/c to [facility] , call report to [phone], PTAR called. No additional TOC needs, TOC sign off   Final next level of care: Long Term Nursing Home Barriers to Discharge: No Barriers Identified   Patient Goals and CMS Choice CMS Medicare.gov Compare Post Acute Care list provided to:: Patient Represenative (must comment) Choice offered to / list presented to : Spouse  Discharge Placement                Patient chooses bed at:  [facility]) Patient to be transferred to facility by: EMS Name of family member notified: Tilford Pillar Patient and family notified of of transfer: 04/30/22  Discharge Plan and Services Additional resources added to the After Visit Summary for                                       Social Determinants of Health (SDOH) Interventions [place]: No Food Insecurity (03/30/2022)  Housing: Low Risk  (03/30/2022)  Transportation Needs: No Transportation Needs (03/30/2022)  Utilities: Not At Risk (03/30/2022)  Tobacco Use: Low Risk  (03/30/2022)     Readmission Risk Interventions    01/09/2022    1:14 PM  Readmission Risk Prevention Plan  Transportation Screening Complete  PCP or Specialist Appt within 3-5 Days Complete  Social Work Consult for [place] Planning/Counseling Complete  Palliative Care Screening Complete  Medication Review [employer]) Complete

## 2022-05-01 ENCOUNTER — Other Ambulatory Visit (HOSPITAL_COMMUNITY): Payer: Self-pay

## 2022-05-01 DIAGNOSIS — R1310 Dysphagia, unspecified: Secondary | ICD-10-CM | POA: Diagnosis not present

## 2022-05-01 DIAGNOSIS — M6281 Muscle weakness (generalized): Secondary | ICD-10-CM | POA: Diagnosis not present

## 2022-05-01 DIAGNOSIS — C649 Malignant neoplasm of unspecified kidney, except renal pelvis: Secondary | ICD-10-CM | POA: Diagnosis not present

## 2022-05-01 DIAGNOSIS — C7949 Secondary malignant neoplasm of other parts of nervous system: Secondary | ICD-10-CM | POA: Diagnosis not present

## 2022-05-01 DIAGNOSIS — G40909 Epilepsy, unspecified, not intractable, without status epilepticus: Secondary | ICD-10-CM | POA: Diagnosis not present

## 2022-05-01 DIAGNOSIS — R2681 Unsteadiness on feet: Secondary | ICD-10-CM | POA: Diagnosis not present

## 2022-05-02 ENCOUNTER — Other Ambulatory Visit: Payer: Self-pay

## 2022-05-02 ENCOUNTER — Inpatient Hospital Stay: Payer: PPO | Admitting: Nurse Practitioner

## 2022-05-02 DIAGNOSIS — E871 Hypo-osmolality and hyponatremia: Secondary | ICD-10-CM | POA: Diagnosis not present

## 2022-05-02 DIAGNOSIS — N179 Acute kidney failure, unspecified: Secondary | ICD-10-CM | POA: Diagnosis not present

## 2022-05-02 DIAGNOSIS — I82402 Acute embolism and thrombosis of unspecified deep veins of left lower extremity: Secondary | ICD-10-CM | POA: Diagnosis not present

## 2022-05-02 DIAGNOSIS — C649 Malignant neoplasm of unspecified kidney, except renal pelvis: Secondary | ICD-10-CM | POA: Diagnosis not present

## 2022-05-02 DIAGNOSIS — D72829 Elevated white blood cell count, unspecified: Secondary | ICD-10-CM | POA: Diagnosis not present

## 2022-05-02 DIAGNOSIS — I10 Essential (primary) hypertension: Secondary | ICD-10-CM | POA: Diagnosis not present

## 2022-05-02 DIAGNOSIS — G9341 Metabolic encephalopathy: Secondary | ICD-10-CM | POA: Diagnosis not present

## 2022-05-02 DIAGNOSIS — C7931 Secondary malignant neoplasm of brain: Secondary | ICD-10-CM | POA: Diagnosis not present

## 2022-05-02 DIAGNOSIS — R339 Retention of urine, unspecified: Secondary | ICD-10-CM | POA: Diagnosis not present

## 2022-05-02 DIAGNOSIS — N5089 Other specified disorders of the male genital organs: Secondary | ICD-10-CM | POA: Diagnosis not present

## 2022-05-04 ENCOUNTER — Other Ambulatory Visit (HOSPITAL_COMMUNITY): Payer: Self-pay

## 2022-05-07 DIAGNOSIS — B3749 Other urogenital candidiasis: Secondary | ICD-10-CM | POA: Diagnosis not present

## 2022-05-07 DIAGNOSIS — N5089 Other specified disorders of the male genital organs: Secondary | ICD-10-CM | POA: Diagnosis not present

## 2022-05-07 DIAGNOSIS — C649 Malignant neoplasm of unspecified kidney, except renal pelvis: Secondary | ICD-10-CM | POA: Diagnosis not present

## 2022-05-14 DIAGNOSIS — C649 Malignant neoplasm of unspecified kidney, except renal pelvis: Secondary | ICD-10-CM | POA: Diagnosis not present

## 2022-05-14 DIAGNOSIS — C7931 Secondary malignant neoplasm of brain: Secondary | ICD-10-CM | POA: Diagnosis not present

## 2022-05-14 DIAGNOSIS — R410 Disorientation, unspecified: Secondary | ICD-10-CM | POA: Diagnosis not present

## 2022-05-14 DIAGNOSIS — R000 Tachycardia, unspecified: Secondary | ICD-10-CM | POA: Diagnosis not present

## 2022-05-14 DIAGNOSIS — I82402 Acute embolism and thrombosis of unspecified deep veins of left lower extremity: Secondary | ICD-10-CM | POA: Diagnosis not present

## 2022-05-14 DIAGNOSIS — I10 Essential (primary) hypertension: Secondary | ICD-10-CM | POA: Diagnosis not present

## 2022-05-15 DIAGNOSIS — C7931 Secondary malignant neoplasm of brain: Secondary | ICD-10-CM | POA: Diagnosis not present

## 2022-05-15 DIAGNOSIS — R000 Tachycardia, unspecified: Secondary | ICD-10-CM | POA: Diagnosis not present

## 2022-05-15 DIAGNOSIS — R059 Cough, unspecified: Secondary | ICD-10-CM | POA: Diagnosis not present

## 2022-05-15 DIAGNOSIS — R339 Retention of urine, unspecified: Secondary | ICD-10-CM | POA: Diagnosis not present

## 2022-05-15 DIAGNOSIS — B3749 Other urogenital candidiasis: Secondary | ICD-10-CM | POA: Diagnosis not present

## 2022-05-15 DIAGNOSIS — C649 Malignant neoplasm of unspecified kidney, except renal pelvis: Secondary | ICD-10-CM | POA: Diagnosis not present

## 2022-05-15 DIAGNOSIS — I10 Essential (primary) hypertension: Secondary | ICD-10-CM | POA: Diagnosis not present

## 2022-05-15 DIAGNOSIS — Z978 Presence of other specified devices: Secondary | ICD-10-CM | POA: Diagnosis not present

## 2022-05-15 DIAGNOSIS — I82402 Acute embolism and thrombosis of unspecified deep veins of left lower extremity: Secondary | ICD-10-CM | POA: Diagnosis not present

## 2022-05-15 DIAGNOSIS — R0989 Other specified symptoms and signs involving the circulatory and respiratory systems: Secondary | ICD-10-CM | POA: Diagnosis not present

## 2022-05-15 DIAGNOSIS — D72829 Elevated white blood cell count, unspecified: Secondary | ICD-10-CM | POA: Diagnosis not present

## 2022-05-16 ENCOUNTER — Other Ambulatory Visit: Payer: Self-pay | Admitting: Hematology and Oncology

## 2022-05-18 ENCOUNTER — Inpatient Hospital Stay (HOSPITAL_BASED_OUTPATIENT_CLINIC_OR_DEPARTMENT_OTHER): Payer: PPO | Admitting: Nurse Practitioner

## 2022-05-18 ENCOUNTER — Other Ambulatory Visit (HOSPITAL_COMMUNITY): Payer: Self-pay

## 2022-05-18 DIAGNOSIS — C649 Malignant neoplasm of unspecified kidney, except renal pelvis: Secondary | ICD-10-CM

## 2022-05-18 NOTE — Progress Notes (Deleted)
Attempted to contact Phillip [name] at 2 different phone numbers.  No answer.  We will follow-up with him and/or his wife.

## 2022-05-18 NOTE — Progress Notes (Deleted)
Attempted to contact at 2 different phone numbers.  No answer.  We will follow-up with [name] and his wife regarding today's phone visit.

## 2022-05-18 NOTE — Progress Notes (Signed)
Attempted to contact at 2 different phone numbers.  No answer.  We will follow-up with Phillip [name] and his wife regarding today's phone visit.

## 2022-05-21 ENCOUNTER — Other Ambulatory Visit: Payer: Self-pay

## 2022-05-23 ENCOUNTER — Other Ambulatory Visit (HOSPITAL_COMMUNITY): Payer: Self-pay

## 2022-05-28 DEATH — deceased

## 2023-07-19 IMAGING — MR MR HEAD WO/W CM
12 series · 47 of 48 positions shown · IV contrast (multihance)
Comparison: 11/04/2020
COMPARISON: 11/04/2020

Addendum:
CLINICAL DATA: Brain metastases, assess treatment response, history
of renal cell carcinoma

EXAM:
MRI HEAD WITHOUT AND WITH CONTRAST
TECHNIQUE: Multiplanar, multiecho pulse sequences of the brain and surrounding
structures were obtained without and with intravenous contrast.
CONTRAST:  20mL MULTIHANCE GADOBENATE DIMEGLUMINE 529 MG/ML IV SOLN

[Series 2: FLAIR · sagittal · 3.0mm · 0.81mm/px · 3 of 42 slices shown (1 of 2)]
[im 1/42]
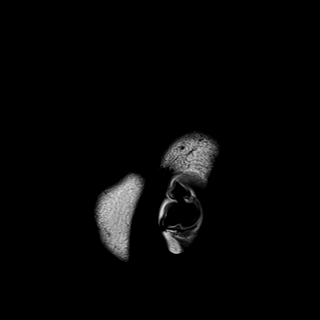
[im 21/42]
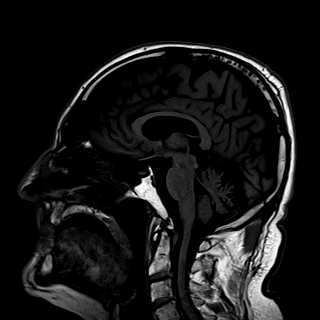
[im 42/42]
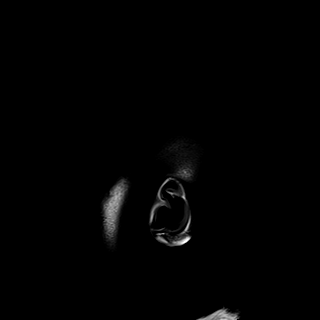

[Series 3: DWI · axial · 3.0mm · 1.56mm/px · z∈[-56,+93]mm · 4 of 80 slices shown (1 of 2)]
[im 1/80]
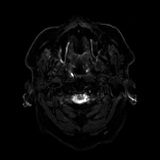
[im 27/80]
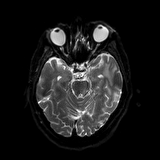
[im 53/80]
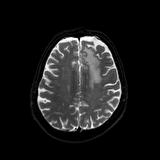
[im 80/80]
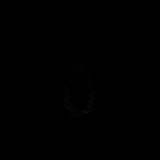

[Series 4: DWI · axial · 3.0mm · 1.56mm/px · z∈[-56,+93]mm · 2 of 40 slices shown (2 of 2)]
[im 1/40]
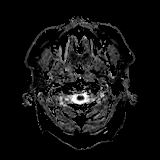
[im 40/40]
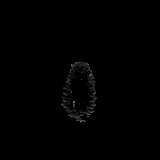

[Series 5: T2 · axial · 5.0mm · 0.65mm/px · 1 of 27 slices shown (1 of 2)]
[im 1/27]
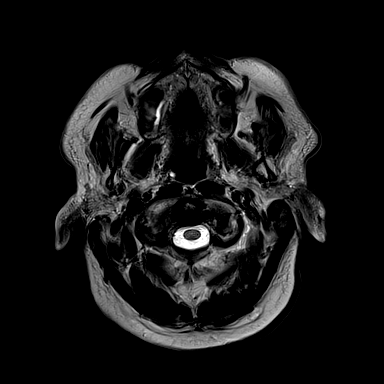

[Series 6: swi_images · axial · 1.5mm · 0.98mm/px · z∈[-57,+56]mm · 4 of 104 slices shown]
[im 1/104]
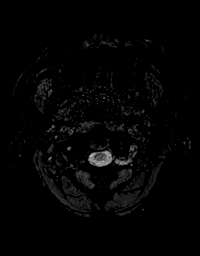
[im 26/104]
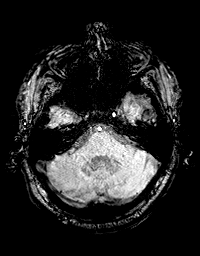
[im 52/104]
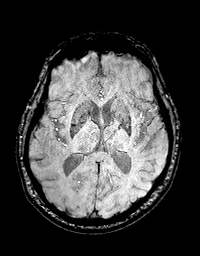
[im 78/104]
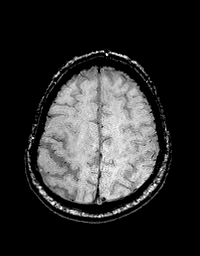

[Series 8: FLAIR · axial · 3.0mm · 0.94mm/px · z∈[-99,+84]mm · 3 of 61 slices shown (2 of 2)]
[im 1/61]
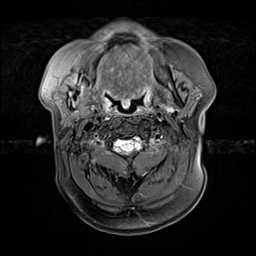
[im 31/61]
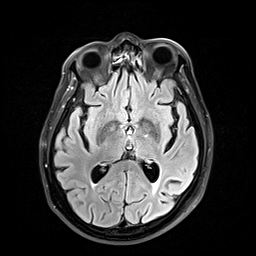
[im 61/61]
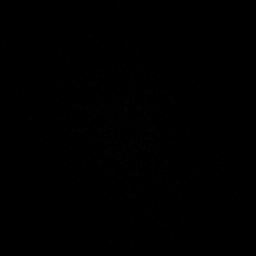

[Series 9: T2 · axial · non-contrast · 1.0mm · 0.94mm/px · z∈[-89,+83]mm · 8 of 176 slices shown (2 of 2)]
[im 1/176]
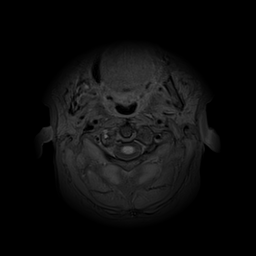
[im 26/176]
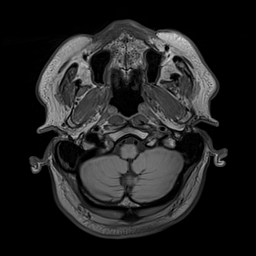
[im 51/176]
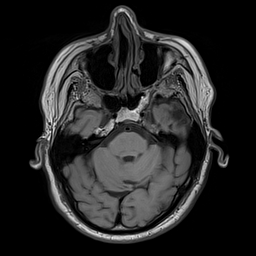
[im 76/176]
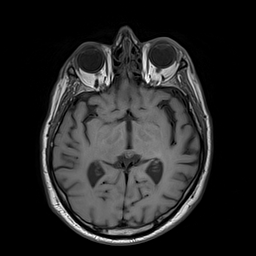
[im 101/176]
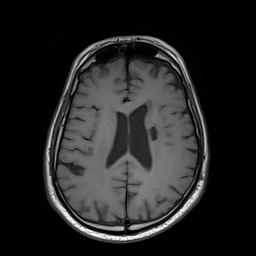
[im 126/176]
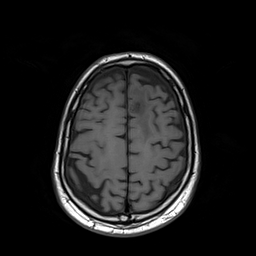
[im 151/176]
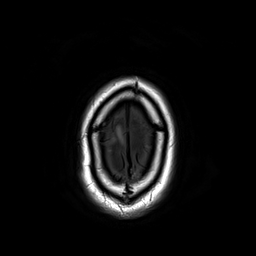
[im 176/176]
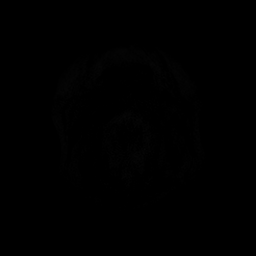

[Series 10: T2 post-contrast · coronal · 3.0mm · 0.57mm/px · 2 of 46 slices shown (1 of 2)]
[im 1/46]
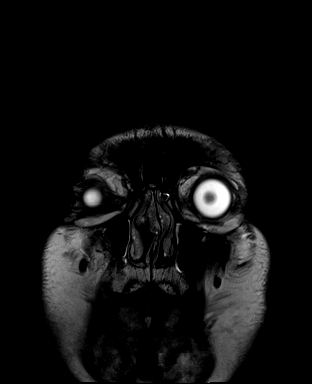
[im 46/46]
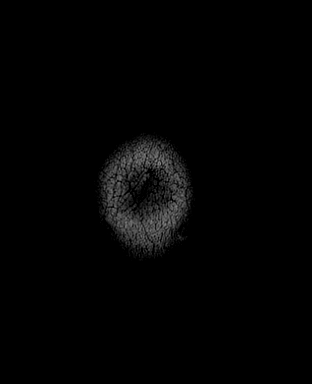

[Series 11: T2 post-contrast · axial · 1.0mm · 0.94mm/px · z∈[-89,+83]mm · 8 of 176 slices shown (2 of 2)]
[im 1/176]
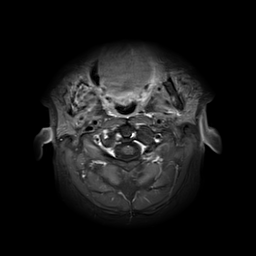
[im 26/176]
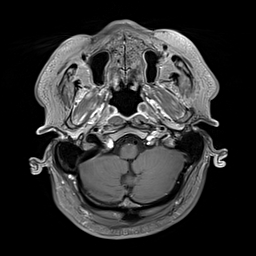
[im 51/176]
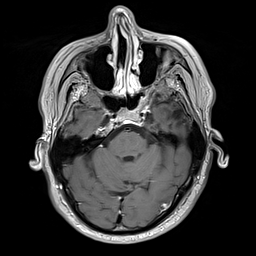
[im 76/176]
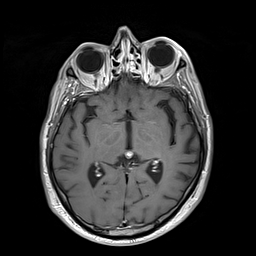
[im 101/176]
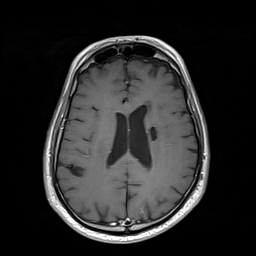
[im 126/176]
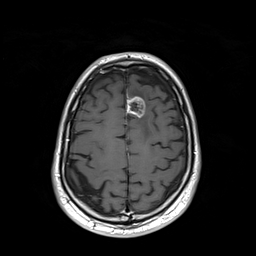
[im 151/176]
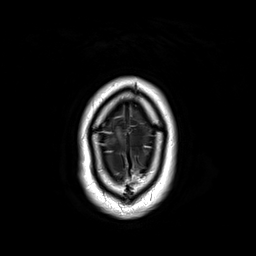
[im 176/176]
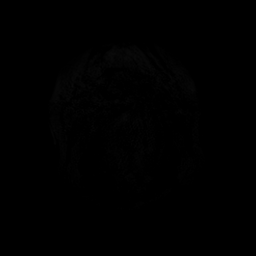

[Series 12: T1 post-contrast · axial · 1.0mm · 0.75mm/px · z∈[-90,+81]mm · 8 of 171 slices shown (1 of 2)]
[im 1/171]
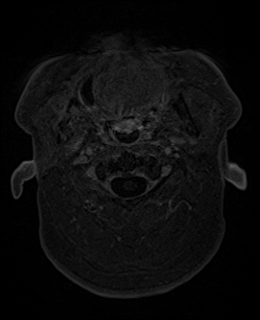
[im 25/171]
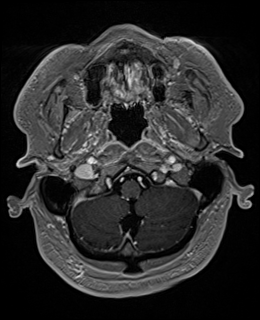
[im 49/171]
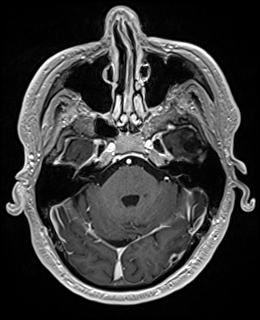
[im 73/171]
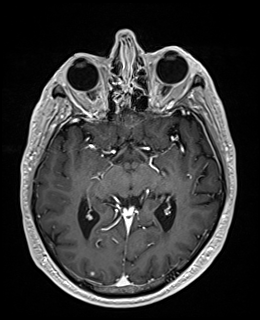
[im 98/171]
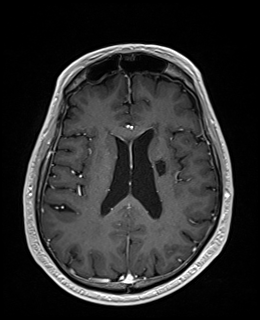
[im 122/171]
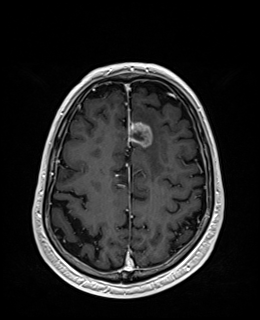
[im 146/171]
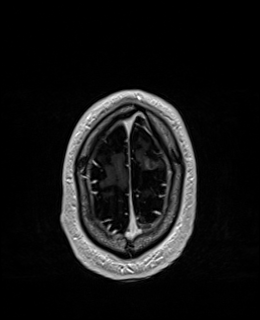
[im 171/171]
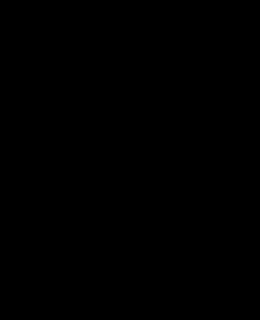

[Series 13: T1 post-contrast · coronal · 3.0mm · 0.57mm/px · 2 of 46 slices shown (2 of 2)]
[im 1/46]
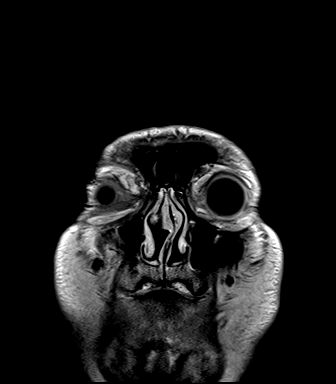
[im 46/46]
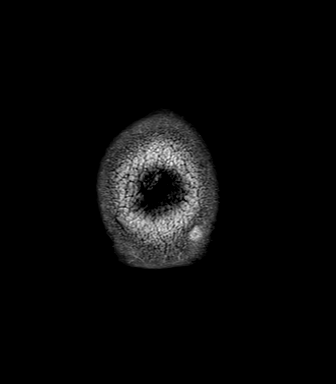

[Series 14: FLAIR post-contrast · sagittal · 3.0mm · 0.81mm/px · 2 of 42 slices shown]
[im 1/42]
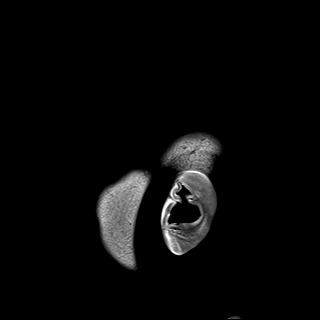
[im 42/42]
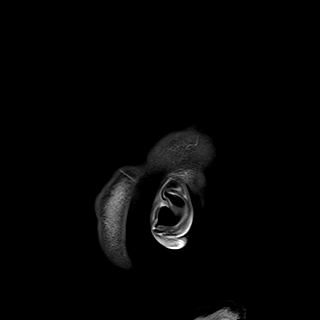

[47 of 48 positions shown; findings below may reference images not displayed]

FINDINGS: Brain:

Previously noted metastatic lesions that have increased in size:

- High left frontal lobe lesion now measures approximately 9 x 11 x
9 mm (AP x TR x CC) (series 12, image 142 and series 13, image 26),
previously 8 x 7 x 6 mm when remeasured similarly.

- Medial left frontal lobe lesion measures 19 x 16 x 23 mm (AP x TR
x CC) (series 12, image 124 and series 13, image 32), previously 15
x 15 x 16 mm when remeasured similarly. Increased confluent T2
hyperintense signal is seen associated with the two left frontal
lobe lesions.

- Right lateral occipital lesion measures 8 x 5 x 6 mm (series 12,
image 65 and series 13, image 7), previously 6 x 5 x 5 mm when
remeasured similarly.

- More superior and medial right occipital lesion measures 3 mm
(series 12, image 73), previously 1-2 mm.

Unchanged:

- Left occipital lesion, measuring 9 x 5 x 5 mm (series 12, image 49
and series 13, image 9).

- 4 mm lesion in the medial left parietal lobe (series 12, image
105)

- Punctate enhancing lesions in the left cerebellum (series 12,
image 25), right cerebellum (series 12, image 43), and right frontal
lobe (series 12, image 126) are unchanged.

Previously noted enhancing focus in the medial left temporal lobe is
no longer definitively seen. No definite new lesions.

Redemonstrated punctate focus of restricted diffusion, intrinsic T1
hyperintense signal, and faint enhancement in the lateral left
temporal lobe, associated with an area of encephalomalacia and
gliosis, which is not favored to represent a metastatic lesion.
Focus of restricted diffusion in the superior left frontal lobe,
associated with the high left frontal lobe lesion (series 3, image
75 and medial left frontal lobe lesion (series 3, image 69). No foci
of restricted diffusion to suggest acute or subacute infarct.

No acute hemorrhage. No midline shift. No extra-axial collection or
hydrocephalus. Redemonstrated remote infarct in the left internal
capsule and corona radiata. T2 hyperintense signal in the
periventricular white matter, likely the sequela of chronic small
vessel ischemic disease.

Vascular: Normal flow voids.

Skull and upper cervical spine: Normal marrow signal.

Sinuses/Orbits: Mild mucosal thickening in the paranasal sinuses.
The orbits are unremarkable.

Other: The mastoids are well aerated.
IMPRESSION: 1. Interval increase in the size of several metastatic lesions, most
prominently the high left and medial left frontal lobe lesions,
which are associated with increasing T2 hyperintense signal, likely
edema. Additional enhancing lesions in the right occipital lobe have
also slightly increased in size.
2. The remainder of the previously noted lesions are either
unchanged or, in the case of enhancing focus in the medial left
temporal lobe, no longer seen. No new metastatic lesions.

ADDENDUM:
Case discussed at tumor board. Additional punctate lesion at the
right frontal operculum on [DATE], not seen prior, please correlate
with initial treatment plan to determine new versus recurrent
enhancement.

*** End of Addendum ***
FINDINGS: Brain:

Previously noted metastatic lesions that have increased in size:

- High left frontal lobe lesion now measures approximately 9 x 11 x
9 mm (AP x TR x CC) (series 12, image 142 and series 13, image 26),
previously 8 x 7 x 6 mm when remeasured similarly.

- Medial left frontal lobe lesion measures 19 x 16 x 23 mm (AP x TR
x CC) (series 12, image 124 and series 13, image 32), previously 15
x 15 x 16 mm when remeasured similarly. Increased confluent T2
hyperintense signal is seen associated with the two left frontal
lobe lesions.

- Right lateral occipital lesion measures 8 x 5 x 6 mm (series 12,
image 65 and series 13, image 7), previously 6 x 5 x 5 mm when
remeasured similarly.

- More superior and medial right occipital lesion measures 3 mm
(series 12, image 73), previously 1-2 mm.

Unchanged:

- Left occipital lesion, measuring 9 x 5 x 5 mm (series 12, image 49
and series 13, image 9).

- 4 mm lesion in the medial left parietal lobe (series 12, image
105)

- Punctate enhancing lesions in the left cerebellum (series 12,
image 25), right cerebellum (series 12, image 43), and right frontal
lobe (series 12, image 126) are unchanged.

Previously noted enhancing focus in the medial left temporal lobe is
no longer definitively seen. No definite new lesions.

Redemonstrated punctate focus of restricted diffusion, intrinsic T1
hyperintense signal, and faint enhancement in the lateral left
temporal lobe, associated with an area of encephalomalacia and
gliosis, which is not favored to represent a metastatic lesion.
Focus of restricted diffusion in the superior left frontal lobe,
associated with the high left frontal lobe lesion (series 3, image
75 and medial left frontal lobe lesion (series 3, image 69). No foci
of restricted diffusion to suggest acute or subacute infarct.

No acute hemorrhage. No midline shift. No extra-axial collection or
hydrocephalus. Redemonstrated remote infarct in the left internal
capsule and corona radiata. T2 hyperintense signal in the
periventricular white matter, likely the sequela of chronic small
vessel ischemic disease.

Vascular: Normal flow voids.

Skull and upper cervical spine: Normal marrow signal.

Sinuses/Orbits: Mild mucosal thickening in the paranasal sinuses.
The orbits are unremarkable.

Other: The mastoids are well aerated.
IMPRESSION: 1. Interval increase in the size of several metastatic lesions, most
prominently the high left and medial left frontal lobe lesions,
which are associated with increasing T2 hyperintense signal, likely
edema. Additional enhancing lesions in the right occipital lobe have
also slightly increased in size.
2. The remainder of the previously noted lesions are either
unchanged or, in the case of enhancing focus in the medial left
temporal lobe, no longer seen. No new metastatic lesions.
